# Patient Record
Sex: Female | Born: 1935 | Race: White | Hispanic: No | Smoking: Never smoker
Health system: Southern US, Community
[De-identification: ages and names within clinical notes are randomized; demographics above are authoritative.]

## PROBLEM LIST (undated history)

## (undated) DIAGNOSIS — I219 Acute myocardial infarction, unspecified: Secondary | ICD-10-CM

## (undated) DIAGNOSIS — K754 Autoimmune hepatitis: Secondary | ICD-10-CM

## (undated) DIAGNOSIS — G629 Polyneuropathy, unspecified: Secondary | ICD-10-CM

## (undated) DIAGNOSIS — M1990 Unspecified osteoarthritis, unspecified site: Secondary | ICD-10-CM

## (undated) DIAGNOSIS — H409 Unspecified glaucoma: Secondary | ICD-10-CM

## (undated) DIAGNOSIS — E7800 Pure hypercholesterolemia, unspecified: Secondary | ICD-10-CM

## (undated) DIAGNOSIS — I5030 Unspecified diastolic (congestive) heart failure: Secondary | ICD-10-CM

## (undated) DIAGNOSIS — I2510 Atherosclerotic heart disease of native coronary artery without angina pectoris: Secondary | ICD-10-CM

## (undated) DIAGNOSIS — N6019 Diffuse cystic mastopathy of unspecified breast: Secondary | ICD-10-CM

## (undated) DIAGNOSIS — I4821 Permanent atrial fibrillation: Secondary | ICD-10-CM

## (undated) DIAGNOSIS — I340 Nonrheumatic mitral (valve) insufficiency: Secondary | ICD-10-CM

## (undated) DIAGNOSIS — K279 Peptic ulcer, site unspecified, unspecified as acute or chronic, without hemorrhage or perforation: Secondary | ICD-10-CM

## (undated) DIAGNOSIS — I10 Essential (primary) hypertension: Secondary | ICD-10-CM

## (undated) DIAGNOSIS — E039 Hypothyroidism, unspecified: Secondary | ICD-10-CM

## (undated) DIAGNOSIS — I4891 Unspecified atrial fibrillation: Secondary | ICD-10-CM

## (undated) DIAGNOSIS — I509 Heart failure, unspecified: Secondary | ICD-10-CM

## (undated) HISTORY — PX: APPENDECTOMY: SHX54

## (undated) HISTORY — DX: Pure hypercholesterolemia, unspecified: E78.00

## (undated) HISTORY — PX: OTHER SURGICAL HISTORY: SHX169

## (undated) HISTORY — DX: Acute myocardial infarction, unspecified: I21.9

## (undated) HISTORY — DX: Peptic ulcer, site unspecified, unspecified as acute or chronic, without hemorrhage or perforation: K27.9

## (undated) HISTORY — DX: Nonrheumatic mitral (valve) insufficiency: I34.0

## (undated) HISTORY — DX: Heart failure, unspecified: I50.9

## (undated) HISTORY — DX: Diffuse cystic mastopathy of unspecified breast: N60.19

## (undated) HISTORY — DX: Atherosclerotic heart disease of native coronary artery without angina pectoris: I25.10

## (undated) HISTORY — DX: Polyneuropathy, unspecified: G62.9

## (undated) HISTORY — DX: Essential (primary) hypertension: I10

## (undated) HISTORY — DX: Autoimmune hepatitis: K75.4

## (undated) HISTORY — DX: Unspecified osteoarthritis, unspecified site: M19.90

---

## 1961-03-11 HISTORY — PX: ABDOMINAL HYSTERECTOMY: SHX81

## 1996-03-11 HISTORY — PX: CHOLECYSTECTOMY: SHX55

## 2002-03-11 HISTORY — PX: UPPER GI ENDOSCOPY: SHX6162

## 2004-02-01 ENCOUNTER — Ambulatory Visit: Payer: Self-pay | Admitting: Internal Medicine

## 2004-11-26 ENCOUNTER — Ambulatory Visit: Payer: Self-pay | Admitting: Specialist

## 2005-01-23 ENCOUNTER — Ambulatory Visit: Payer: Self-pay | Admitting: Internal Medicine

## 2005-02-14 ENCOUNTER — Ambulatory Visit: Payer: Self-pay | Admitting: Internal Medicine

## 2005-02-15 ENCOUNTER — Ambulatory Visit: Payer: Self-pay | Admitting: Internal Medicine

## 2005-03-19 ENCOUNTER — Ambulatory Visit: Payer: Self-pay | Admitting: Gastroenterology

## 2005-04-09 ENCOUNTER — Ambulatory Visit: Payer: Self-pay | Admitting: Internal Medicine

## 2005-04-18 ENCOUNTER — Ambulatory Visit: Payer: Self-pay | Admitting: Internal Medicine

## 2005-05-01 ENCOUNTER — Ambulatory Visit: Payer: Self-pay | Admitting: Gastroenterology

## 2005-05-09 ENCOUNTER — Ambulatory Visit: Payer: Self-pay | Admitting: Internal Medicine

## 2005-06-07 ENCOUNTER — Ambulatory Visit: Payer: Self-pay | Admitting: Internal Medicine

## 2005-06-12 ENCOUNTER — Ambulatory Visit: Payer: Self-pay | Admitting: Gastroenterology

## 2005-06-13 ENCOUNTER — Ambulatory Visit: Payer: Self-pay | Admitting: Internal Medicine

## 2005-07-23 ENCOUNTER — Ambulatory Visit: Payer: Self-pay | Admitting: Gastroenterology

## 2005-07-25 ENCOUNTER — Ambulatory Visit: Payer: Self-pay | Admitting: Internal Medicine

## 2005-08-09 ENCOUNTER — Ambulatory Visit: Payer: Self-pay | Admitting: Internal Medicine

## 2005-09-17 ENCOUNTER — Ambulatory Visit: Payer: Self-pay | Admitting: Internal Medicine

## 2005-09-19 ENCOUNTER — Ambulatory Visit: Payer: Self-pay | Admitting: Internal Medicine

## 2005-10-09 ENCOUNTER — Ambulatory Visit: Payer: Self-pay | Admitting: Internal Medicine

## 2005-11-15 ENCOUNTER — Ambulatory Visit: Payer: Self-pay | Admitting: Internal Medicine

## 2005-12-09 ENCOUNTER — Ambulatory Visit: Payer: Self-pay | Admitting: Internal Medicine

## 2006-02-03 ENCOUNTER — Ambulatory Visit: Payer: Self-pay | Admitting: Internal Medicine

## 2006-02-08 ENCOUNTER — Ambulatory Visit: Payer: Self-pay | Admitting: Internal Medicine

## 2006-02-10 ENCOUNTER — Ambulatory Visit: Payer: Self-pay | Admitting: Internal Medicine

## 2006-02-26 ENCOUNTER — Ambulatory Visit: Payer: Self-pay | Admitting: Internal Medicine

## 2006-04-29 ENCOUNTER — Ambulatory Visit: Payer: Self-pay | Admitting: Internal Medicine

## 2006-05-10 ENCOUNTER — Ambulatory Visit: Payer: Self-pay | Admitting: Internal Medicine

## 2006-05-26 ENCOUNTER — Ambulatory Visit: Payer: Self-pay | Admitting: Internal Medicine

## 2006-06-03 ENCOUNTER — Ambulatory Visit: Payer: Self-pay | Admitting: Gastroenterology

## 2006-06-05 ENCOUNTER — Ambulatory Visit: Payer: Self-pay | Admitting: Gastroenterology

## 2006-06-10 ENCOUNTER — Ambulatory Visit: Payer: Self-pay | Admitting: Gastroenterology

## 2006-07-08 ENCOUNTER — Ambulatory Visit: Payer: Self-pay | Admitting: Gastroenterology

## 2006-07-22 ENCOUNTER — Ambulatory Visit: Payer: Self-pay | Admitting: Internal Medicine

## 2006-08-10 ENCOUNTER — Ambulatory Visit: Payer: Self-pay | Admitting: Internal Medicine

## 2006-09-22 ENCOUNTER — Encounter: Admission: RE | Admit: 2006-09-22 | Discharge: 2006-09-22 | Payer: Self-pay | Admitting: Surgery

## 2006-10-14 ENCOUNTER — Ambulatory Visit: Payer: Self-pay | Admitting: Internal Medicine

## 2006-10-14 ENCOUNTER — Ambulatory Visit: Payer: Self-pay | Admitting: Gastroenterology

## 2006-10-23 ENCOUNTER — Ambulatory Visit: Payer: Self-pay | Admitting: Gastroenterology

## 2006-11-10 ENCOUNTER — Ambulatory Visit: Payer: Self-pay | Admitting: Internal Medicine

## 2006-12-01 ENCOUNTER — Ambulatory Visit: Payer: Self-pay | Admitting: Internal Medicine

## 2006-12-10 ENCOUNTER — Ambulatory Visit: Payer: Self-pay | Admitting: Internal Medicine

## 2007-01-02 ENCOUNTER — Ambulatory Visit: Payer: Self-pay | Admitting: Ophthalmology

## 2007-01-05 ENCOUNTER — Ambulatory Visit: Payer: Self-pay | Admitting: Internal Medicine

## 2007-01-10 ENCOUNTER — Ambulatory Visit: Payer: Self-pay | Admitting: Internal Medicine

## 2007-01-22 ENCOUNTER — Ambulatory Visit: Payer: Self-pay | Admitting: Gastroenterology

## 2007-02-03 ENCOUNTER — Ambulatory Visit: Payer: Self-pay | Admitting: Internal Medicine

## 2007-02-26 ENCOUNTER — Ambulatory Visit: Payer: Self-pay | Admitting: Internal Medicine

## 2007-03-10 ENCOUNTER — Ambulatory Visit: Payer: Self-pay | Admitting: Internal Medicine

## 2007-03-12 ENCOUNTER — Ambulatory Visit: Payer: Self-pay | Admitting: Internal Medicine

## 2007-03-12 HISTORY — PX: COLONOSCOPY: SHX174

## 2007-04-12 ENCOUNTER — Ambulatory Visit: Payer: Self-pay | Admitting: Internal Medicine

## 2007-05-29 ENCOUNTER — Ambulatory Visit: Payer: Self-pay | Admitting: Internal Medicine

## 2007-06-08 ENCOUNTER — Ambulatory Visit: Payer: Self-pay | Admitting: Internal Medicine

## 2007-06-10 ENCOUNTER — Ambulatory Visit: Payer: Self-pay | Admitting: Internal Medicine

## 2007-07-10 ENCOUNTER — Ambulatory Visit: Payer: Self-pay | Admitting: Internal Medicine

## 2007-07-24 ENCOUNTER — Ambulatory Visit: Payer: Self-pay | Admitting: Internal Medicine

## 2007-08-04 ENCOUNTER — Ambulatory Visit: Payer: Self-pay | Admitting: Internal Medicine

## 2007-09-09 ENCOUNTER — Ambulatory Visit: Payer: Self-pay | Admitting: Internal Medicine

## 2007-09-17 ENCOUNTER — Ambulatory Visit: Payer: Self-pay | Admitting: Gastroenterology

## 2007-09-18 ENCOUNTER — Ambulatory Visit: Payer: Self-pay | Admitting: Internal Medicine

## 2007-09-28 ENCOUNTER — Ambulatory Visit: Payer: Self-pay | Admitting: Gastroenterology

## 2007-10-10 ENCOUNTER — Ambulatory Visit: Payer: Self-pay | Admitting: Internal Medicine

## 2007-10-20 ENCOUNTER — Ambulatory Visit: Payer: Self-pay | Admitting: General Surgery

## 2007-11-18 ENCOUNTER — Ambulatory Visit: Payer: Self-pay | Admitting: Ophthalmology

## 2007-12-01 ENCOUNTER — Ambulatory Visit: Payer: Self-pay | Admitting: Internal Medicine

## 2007-12-31 ENCOUNTER — Ambulatory Visit: Payer: Self-pay | Admitting: Internal Medicine

## 2008-01-10 ENCOUNTER — Ambulatory Visit: Payer: Self-pay | Admitting: Internal Medicine

## 2008-01-20 ENCOUNTER — Ambulatory Visit: Payer: Self-pay | Admitting: Internal Medicine

## 2008-02-09 ENCOUNTER — Ambulatory Visit: Payer: Self-pay | Admitting: Internal Medicine

## 2008-03-08 ENCOUNTER — Ambulatory Visit: Payer: Self-pay | Admitting: Internal Medicine

## 2008-03-10 ENCOUNTER — Ambulatory Visit: Payer: Self-pay | Admitting: Internal Medicine

## 2008-05-18 ENCOUNTER — Ambulatory Visit: Payer: Self-pay | Admitting: Internal Medicine

## 2008-06-07 ENCOUNTER — Ambulatory Visit: Payer: Self-pay | Admitting: Internal Medicine

## 2008-06-09 ENCOUNTER — Ambulatory Visit: Payer: Self-pay | Admitting: Internal Medicine

## 2008-08-31 ENCOUNTER — Other Ambulatory Visit: Payer: Self-pay | Admitting: Gastroenterology

## 2008-08-31 ENCOUNTER — Other Ambulatory Visit: Payer: Self-pay | Admitting: Internal Medicine

## 2008-09-08 ENCOUNTER — Ambulatory Visit: Payer: Self-pay | Admitting: Internal Medicine

## 2008-09-14 ENCOUNTER — Ambulatory Visit: Payer: Self-pay | Admitting: Internal Medicine

## 2008-09-21 ENCOUNTER — Ambulatory Visit: Payer: Self-pay | Admitting: Gastroenterology

## 2008-10-09 ENCOUNTER — Ambulatory Visit: Payer: Self-pay | Admitting: Internal Medicine

## 2008-12-30 ENCOUNTER — Emergency Department: Payer: Self-pay | Admitting: Emergency Medicine

## 2009-01-06 ENCOUNTER — Other Ambulatory Visit: Payer: Self-pay | Admitting: Internal Medicine

## 2009-03-14 ENCOUNTER — Ambulatory Visit: Payer: Self-pay | Admitting: Internal Medicine

## 2009-06-29 ENCOUNTER — Other Ambulatory Visit: Payer: Self-pay | Admitting: Internal Medicine

## 2009-12-07 ENCOUNTER — Other Ambulatory Visit: Payer: Self-pay | Admitting: Internal Medicine

## 2009-12-11 ENCOUNTER — Ambulatory Visit: Payer: Self-pay | Admitting: Gastroenterology

## 2009-12-22 ENCOUNTER — Other Ambulatory Visit: Payer: Self-pay | Admitting: Internal Medicine

## 2010-03-15 ENCOUNTER — Ambulatory Visit: Payer: Self-pay | Admitting: Internal Medicine

## 2010-03-28 ENCOUNTER — Other Ambulatory Visit: Payer: Self-pay | Admitting: Internal Medicine

## 2010-07-06 ENCOUNTER — Other Ambulatory Visit: Payer: Self-pay | Admitting: Internal Medicine

## 2010-08-16 ENCOUNTER — Encounter (INDEPENDENT_AMBULATORY_CARE_PROVIDER_SITE_OTHER): Payer: Self-pay | Admitting: Surgery

## 2010-09-04 ENCOUNTER — Emergency Department: Payer: Self-pay | Admitting: Emergency Medicine

## 2010-10-17 ENCOUNTER — Emergency Department (HOSPITAL_COMMUNITY): Payer: No Typology Code available for payment source

## 2010-10-17 ENCOUNTER — Emergency Department (HOSPITAL_COMMUNITY)
Admission: EM | Admit: 2010-10-17 | Discharge: 2010-10-17 | Disposition: A | Discharge status: Home / Self Care | Payer: No Typology Code available for payment source | Attending: Emergency Medicine | Admitting: Emergency Medicine

## 2010-10-17 DIAGNOSIS — M25579 Pain in unspecified ankle and joints of unspecified foot: Secondary | ICD-10-CM | POA: Insufficient documentation

## 2010-10-17 DIAGNOSIS — S82899A Other fracture of unspecified lower leg, initial encounter for closed fracture: Secondary | ICD-10-CM | POA: Insufficient documentation

## 2010-10-17 DIAGNOSIS — M79609 Pain in unspecified limb: Secondary | ICD-10-CM | POA: Insufficient documentation

## 2010-10-17 DIAGNOSIS — Z79899 Other long term (current) drug therapy: Secondary | ICD-10-CM | POA: Insufficient documentation

## 2010-10-17 DIAGNOSIS — Z7982 Long term (current) use of aspirin: Secondary | ICD-10-CM | POA: Insufficient documentation

## 2010-10-17 DIAGNOSIS — R9389 Abnormal findings on diagnostic imaging of other specified body structures: Secondary | ICD-10-CM | POA: Insufficient documentation

## 2010-10-17 DIAGNOSIS — M25473 Effusion, unspecified ankle: Secondary | ICD-10-CM | POA: Insufficient documentation

## 2010-10-17 DIAGNOSIS — M542 Cervicalgia: Secondary | ICD-10-CM | POA: Insufficient documentation

## 2010-10-17 DIAGNOSIS — S5010XA Contusion of unspecified forearm, initial encounter: Secondary | ICD-10-CM | POA: Insufficient documentation

## 2010-10-17 DIAGNOSIS — M25476 Effusion, unspecified foot: Secondary | ICD-10-CM | POA: Insufficient documentation

## 2010-10-17 LAB — POCT I-STAT, CHEM 8
BUN: 17 mg/dL (ref 6–23)
Calcium, Ion: 1.07 mmol/L — ABNORMAL LOW (ref 1.12–1.32)
Creatinine, Ser: 1.1 mg/dL (ref 0.50–1.10)
Glucose, Bld: 114 mg/dL — ABNORMAL HIGH (ref 70–99)
TCO2: 21 mmol/L (ref 0–100)

## 2010-10-17 LAB — URINALYSIS, ROUTINE W REFLEX MICROSCOPIC
Bilirubin Urine: NEGATIVE
Glucose, UA: NEGATIVE mg/dL
Ketones, ur: NEGATIVE mg/dL
Protein, ur: NEGATIVE mg/dL

## 2010-10-17 LAB — URINE MICROSCOPIC-ADD ON

## 2010-10-17 MED ORDER — IOHEXOL 300 MG/ML  SOLN
80.0000 mL | Freq: Once | INTRAMUSCULAR | Status: AC | PRN
  Administered 2010-10-17: 80 mL via INTRAVENOUS

## 2010-11-28 ENCOUNTER — Other Ambulatory Visit: Payer: Self-pay | Admitting: Internal Medicine

## 2010-12-05 ENCOUNTER — Other Ambulatory Visit: Payer: Self-pay | Admitting: Internal Medicine

## 2011-01-07 ENCOUNTER — Ambulatory Visit: Payer: Self-pay | Admitting: Gastroenterology

## 2011-02-15 ENCOUNTER — Other Ambulatory Visit: Payer: Self-pay | Admitting: Internal Medicine

## 2011-02-21 ENCOUNTER — Other Ambulatory Visit: Payer: Self-pay | Admitting: Internal Medicine

## 2011-03-14 DIAGNOSIS — M999 Biomechanical lesion, unspecified: Secondary | ICD-10-CM | POA: Diagnosis not present

## 2011-03-14 DIAGNOSIS — M5380 Other specified dorsopathies, site unspecified: Secondary | ICD-10-CM | POA: Diagnosis not present

## 2011-03-14 DIAGNOSIS — M5430 Sciatica, unspecified side: Secondary | ICD-10-CM | POA: Diagnosis not present

## 2011-03-18 ENCOUNTER — Ambulatory Visit: Payer: Self-pay | Admitting: Internal Medicine

## 2011-03-18 DIAGNOSIS — Z1231 Encounter for screening mammogram for malignant neoplasm of breast: Secondary | ICD-10-CM | POA: Diagnosis not present

## 2011-03-18 DIAGNOSIS — R920 Mammographic microcalcification found on diagnostic imaging of breast: Secondary | ICD-10-CM | POA: Diagnosis not present

## 2011-03-25 DIAGNOSIS — L819 Disorder of pigmentation, unspecified: Secondary | ICD-10-CM | POA: Diagnosis not present

## 2011-03-25 DIAGNOSIS — L723 Sebaceous cyst: Secondary | ICD-10-CM | POA: Diagnosis not present

## 2011-03-25 DIAGNOSIS — L570 Actinic keratosis: Secondary | ICD-10-CM | POA: Diagnosis not present

## 2011-03-26 DIAGNOSIS — R002 Palpitations: Secondary | ICD-10-CM | POA: Diagnosis not present

## 2011-03-26 DIAGNOSIS — I10 Essential (primary) hypertension: Secondary | ICD-10-CM | POA: Diagnosis not present

## 2011-03-26 DIAGNOSIS — E7800 Pure hypercholesterolemia, unspecified: Secondary | ICD-10-CM | POA: Diagnosis not present

## 2011-03-26 DIAGNOSIS — K219 Gastro-esophageal reflux disease without esophagitis: Secondary | ICD-10-CM | POA: Diagnosis not present

## 2011-03-26 DIAGNOSIS — R42 Dizziness and giddiness: Secondary | ICD-10-CM | POA: Diagnosis not present

## 2011-03-28 DIAGNOSIS — M5430 Sciatica, unspecified side: Secondary | ICD-10-CM | POA: Diagnosis not present

## 2011-03-28 DIAGNOSIS — M999 Biomechanical lesion, unspecified: Secondary | ICD-10-CM | POA: Diagnosis not present

## 2011-03-28 DIAGNOSIS — M5380 Other specified dorsopathies, site unspecified: Secondary | ICD-10-CM | POA: Diagnosis not present

## 2011-04-03 DIAGNOSIS — J069 Acute upper respiratory infection, unspecified: Secondary | ICD-10-CM | POA: Diagnosis not present

## 2011-04-09 DIAGNOSIS — B351 Tinea unguium: Secondary | ICD-10-CM | POA: Diagnosis not present

## 2011-04-09 DIAGNOSIS — Q6689 Other  specified congenital deformities of feet: Secondary | ICD-10-CM | POA: Diagnosis not present

## 2011-04-09 DIAGNOSIS — M79609 Pain in unspecified limb: Secondary | ICD-10-CM | POA: Diagnosis not present

## 2011-04-09 DIAGNOSIS — M779 Enthesopathy, unspecified: Secondary | ICD-10-CM | POA: Diagnosis not present

## 2011-04-10 DIAGNOSIS — R011 Cardiac murmur, unspecified: Secondary | ICD-10-CM | POA: Diagnosis not present

## 2011-04-11 DIAGNOSIS — M5380 Other specified dorsopathies, site unspecified: Secondary | ICD-10-CM | POA: Diagnosis not present

## 2011-04-11 DIAGNOSIS — M5430 Sciatica, unspecified side: Secondary | ICD-10-CM | POA: Diagnosis not present

## 2011-04-11 DIAGNOSIS — M999 Biomechanical lesion, unspecified: Secondary | ICD-10-CM | POA: Diagnosis not present

## 2011-04-18 ENCOUNTER — Other Ambulatory Visit (INDEPENDENT_AMBULATORY_CARE_PROVIDER_SITE_OTHER): Payer: Self-pay | Admitting: Surgery

## 2011-04-23 DIAGNOSIS — I10 Essential (primary) hypertension: Secondary | ICD-10-CM | POA: Diagnosis not present

## 2011-04-23 DIAGNOSIS — J019 Acute sinusitis, unspecified: Secondary | ICD-10-CM | POA: Diagnosis not present

## 2011-04-25 ENCOUNTER — Encounter (INDEPENDENT_AMBULATORY_CARE_PROVIDER_SITE_OTHER): Payer: Self-pay | Admitting: Surgery

## 2011-04-30 ENCOUNTER — Ambulatory Visit (INDEPENDENT_AMBULATORY_CARE_PROVIDER_SITE_OTHER): Payer: Medicare Other | Admitting: Surgery

## 2011-04-30 ENCOUNTER — Encounter (INDEPENDENT_AMBULATORY_CARE_PROVIDER_SITE_OTHER): Payer: Self-pay | Admitting: Surgery

## 2011-04-30 DIAGNOSIS — E039 Hypothyroidism, unspecified: Secondary | ICD-10-CM | POA: Diagnosis not present

## 2011-04-30 DIAGNOSIS — E063 Autoimmune thyroiditis: Secondary | ICD-10-CM | POA: Insufficient documentation

## 2011-04-30 MED ORDER — LEVOTHYROXINE SODIUM 112 MCG PO TABS: 112.0000 ug | ORAL_TABLET | Freq: Every day | ORAL | Status: DC

## 2011-04-30 NOTE — Progress Notes (Signed)
Visit Diagnoses: 1. Hypothyroidism   2. Thyroiditis, lymphocytic     HISTORY: Patient is an 78 year old white female followed in my practice for autoimmune thyroiditis and hypothyroidism. She was last evaluated here in March of 2012. At that time she underwent thyroid ultrasound. She is currently taking Synthroid 112 mcg daily. Patient returns today for scheduled followup.  PERTINENT REVIEW OF SYSTEMS: The patient denies any new masses or nodules. She denies pain. She denies palpitations. She denies tremors. She denies dysphagia. She denies shortness of breath.  EXAM: HEENT: normocephalic; pupils equal and reactive; sclerae clear; dentition good; mucous membranes moist NECK:  Thyroid gland is firm, right lobe slightly larger than the left; symmetric on extension; no palpable anterior or posterior cervical lymphadenopathy; no supraclavicular masses; no tenderness CHEST: clear to auscultation bilaterally without rales, rhonchi, or wheezes CARDIAC: regular rate and rhythm without significant murmur; peripheral pulses are full EXT:  non-tender without edema; no deformity NEURO: no gross focal deficits; no sign of tremor   IMPRESSION: #1 probable autoimmune thyroiditis #2 hypothyroidism  PLAN: Patient requires no further studies at this point in time. I have renewed her prescription for Synthroid 112 mcg daily for one year. She will return for followup in one year.  Prior to that visit, we will ask her primary physician to obtain a TSH level.  Earnstine Regal, MD, [facility]  Primary MD:  Dr. Einar Pheasant

## 2011-05-21 DIAGNOSIS — M79609 Pain in unspecified limb: Secondary | ICD-10-CM | POA: Diagnosis not present

## 2011-05-21 DIAGNOSIS — E7800 Pure hypercholesterolemia, unspecified: Secondary | ICD-10-CM | POA: Diagnosis not present

## 2011-05-21 DIAGNOSIS — I10 Essential (primary) hypertension: Secondary | ICD-10-CM | POA: Diagnosis not present

## 2011-05-21 DIAGNOSIS — M542 Cervicalgia: Secondary | ICD-10-CM | POA: Diagnosis not present

## 2011-05-23 DIAGNOSIS — M62838 Other muscle spasm: Secondary | ICD-10-CM | POA: Diagnosis not present

## 2011-05-23 DIAGNOSIS — M5412 Radiculopathy, cervical region: Secondary | ICD-10-CM | POA: Diagnosis not present

## 2011-05-23 DIAGNOSIS — M9981 Other biomechanical lesions of cervical region: Secondary | ICD-10-CM | POA: Diagnosis not present

## 2011-05-23 DIAGNOSIS — M542 Cervicalgia: Secondary | ICD-10-CM | POA: Diagnosis not present

## 2011-05-30 DIAGNOSIS — M9981 Other biomechanical lesions of cervical region: Secondary | ICD-10-CM | POA: Diagnosis not present

## 2011-05-30 DIAGNOSIS — M542 Cervicalgia: Secondary | ICD-10-CM | POA: Diagnosis not present

## 2011-05-30 DIAGNOSIS — M62838 Other muscle spasm: Secondary | ICD-10-CM | POA: Diagnosis not present

## 2011-05-30 DIAGNOSIS — M5412 Radiculopathy, cervical region: Secondary | ICD-10-CM | POA: Diagnosis not present

## 2011-06-04 ENCOUNTER — Encounter (INDEPENDENT_AMBULATORY_CARE_PROVIDER_SITE_OTHER): Payer: Self-pay

## 2011-06-12 DIAGNOSIS — K745 Biliary cirrhosis, unspecified: Secondary | ICD-10-CM | POA: Diagnosis not present

## 2011-06-19 DIAGNOSIS — Q6689 Other  specified congenital deformities of feet: Secondary | ICD-10-CM | POA: Diagnosis not present

## 2011-06-19 DIAGNOSIS — M779 Enthesopathy, unspecified: Secondary | ICD-10-CM | POA: Diagnosis not present

## 2011-06-21 DIAGNOSIS — E7800 Pure hypercholesterolemia, unspecified: Secondary | ICD-10-CM | POA: Diagnosis not present

## 2011-06-21 DIAGNOSIS — Z79899 Other long term (current) drug therapy: Secondary | ICD-10-CM | POA: Diagnosis not present

## 2011-06-21 DIAGNOSIS — I10 Essential (primary) hypertension: Secondary | ICD-10-CM | POA: Diagnosis not present

## 2011-07-01 DIAGNOSIS — E7800 Pure hypercholesterolemia, unspecified: Secondary | ICD-10-CM | POA: Diagnosis not present

## 2011-07-01 DIAGNOSIS — I10 Essential (primary) hypertension: Secondary | ICD-10-CM | POA: Diagnosis not present

## 2011-07-01 DIAGNOSIS — R945 Abnormal results of liver function studies: Secondary | ICD-10-CM | POA: Diagnosis not present

## 2011-07-25 DIAGNOSIS — E039 Hypothyroidism, unspecified: Secondary | ICD-10-CM | POA: Diagnosis not present

## 2011-07-25 DIAGNOSIS — I10 Essential (primary) hypertension: Secondary | ICD-10-CM | POA: Diagnosis not present

## 2011-08-15 DIAGNOSIS — H26499 Other secondary cataract, unspecified eye: Secondary | ICD-10-CM | POA: Diagnosis not present

## 2011-08-23 DIAGNOSIS — J029 Acute pharyngitis, unspecified: Secondary | ICD-10-CM | POA: Diagnosis not present

## 2011-09-05 DIAGNOSIS — J209 Acute bronchitis, unspecified: Secondary | ICD-10-CM | POA: Diagnosis not present

## 2011-09-05 DIAGNOSIS — J309 Allergic rhinitis, unspecified: Secondary | ICD-10-CM | POA: Diagnosis not present

## 2011-09-11 DIAGNOSIS — Q6689 Other  specified congenital deformities of feet: Secondary | ICD-10-CM | POA: Diagnosis not present

## 2011-09-11 DIAGNOSIS — M779 Enthesopathy, unspecified: Secondary | ICD-10-CM | POA: Diagnosis not present

## 2011-09-11 DIAGNOSIS — M79609 Pain in unspecified limb: Secondary | ICD-10-CM | POA: Diagnosis not present

## 2011-09-11 DIAGNOSIS — B351 Tinea unguium: Secondary | ICD-10-CM | POA: Diagnosis not present

## 2011-09-19 DIAGNOSIS — E7800 Pure hypercholesterolemia, unspecified: Secondary | ICD-10-CM | POA: Diagnosis not present

## 2011-09-19 DIAGNOSIS — J019 Acute sinusitis, unspecified: Secondary | ICD-10-CM | POA: Diagnosis not present

## 2011-09-19 DIAGNOSIS — I10 Essential (primary) hypertension: Secondary | ICD-10-CM | POA: Diagnosis not present

## 2011-10-23 DIAGNOSIS — M810 Age-related osteoporosis without current pathological fracture: Secondary | ICD-10-CM | POA: Diagnosis not present

## 2011-10-30 DIAGNOSIS — M810 Age-related osteoporosis without current pathological fracture: Secondary | ICD-10-CM | POA: Diagnosis not present

## 2011-12-04 DIAGNOSIS — M79609 Pain in unspecified limb: Secondary | ICD-10-CM | POA: Diagnosis not present

## 2011-12-04 DIAGNOSIS — B351 Tinea unguium: Secondary | ICD-10-CM | POA: Diagnosis not present

## 2011-12-10 ENCOUNTER — Ambulatory Visit: Payer: Self-pay | Admitting: Gastroenterology

## 2011-12-10 DIAGNOSIS — K838 Other specified diseases of biliary tract: Secondary | ICD-10-CM | POA: Diagnosis not present

## 2011-12-10 DIAGNOSIS — R935 Abnormal findings on diagnostic imaging of other abdominal regions, including retroperitoneum: Secondary | ICD-10-CM | POA: Diagnosis not present

## 2011-12-10 DIAGNOSIS — R7401 Elevation of levels of liver transaminase levels: Secondary | ICD-10-CM | POA: Diagnosis not present

## 2011-12-10 DIAGNOSIS — R7989 Other specified abnormal findings of blood chemistry: Secondary | ICD-10-CM | POA: Diagnosis not present

## 2011-12-10 DIAGNOSIS — Z23 Encounter for immunization: Secondary | ICD-10-CM | POA: Diagnosis not present

## 2011-12-11 DIAGNOSIS — K745 Biliary cirrhosis, unspecified: Secondary | ICD-10-CM | POA: Diagnosis not present

## 2011-12-18 DIAGNOSIS — J029 Acute pharyngitis, unspecified: Secondary | ICD-10-CM | POA: Diagnosis not present

## 2011-12-18 DIAGNOSIS — J019 Acute sinusitis, unspecified: Secondary | ICD-10-CM | POA: Diagnosis not present

## 2012-01-07 ENCOUNTER — Ambulatory Visit: Payer: Self-pay | Admitting: Gastroenterology

## 2012-01-07 DIAGNOSIS — K769 Liver disease, unspecified: Secondary | ICD-10-CM | POA: Diagnosis not present

## 2012-01-07 DIAGNOSIS — R141 Gas pain: Secondary | ICD-10-CM | POA: Diagnosis not present

## 2012-01-07 DIAGNOSIS — R933 Abnormal findings on diagnostic imaging of other parts of digestive tract: Secondary | ICD-10-CM | POA: Diagnosis not present

## 2012-01-07 DIAGNOSIS — R142 Eructation: Secondary | ICD-10-CM | POA: Diagnosis not present

## 2012-01-07 DIAGNOSIS — Z9889 Other specified postprocedural states: Secondary | ICD-10-CM | POA: Diagnosis not present

## 2012-01-15 ENCOUNTER — Encounter: Payer: Self-pay | Admitting: *Deleted

## 2012-01-16 ENCOUNTER — Ambulatory Visit (INDEPENDENT_AMBULATORY_CARE_PROVIDER_SITE_OTHER): Payer: Medicare Other | Admitting: Internal Medicine

## 2012-01-16 ENCOUNTER — Encounter: Payer: Self-pay | Admitting: Internal Medicine

## 2012-01-16 VITALS — BP 144/78 | HR 62 | Temp 98.5°F | Ht 61.0 in | Wt 148.0 lb

## 2012-01-16 DIAGNOSIS — K754 Autoimmune hepatitis: Secondary | ICD-10-CM

## 2012-01-16 DIAGNOSIS — R5381 Other malaise: Secondary | ICD-10-CM

## 2012-01-16 DIAGNOSIS — Z139 Encounter for screening, unspecified: Secondary | ICD-10-CM

## 2012-01-16 DIAGNOSIS — R040 Epistaxis: Secondary | ICD-10-CM

## 2012-01-16 DIAGNOSIS — R5383 Other fatigue: Secondary | ICD-10-CM

## 2012-01-16 DIAGNOSIS — E785 Hyperlipidemia, unspecified: Secondary | ICD-10-CM | POA: Insufficient documentation

## 2012-01-16 DIAGNOSIS — I10 Essential (primary) hypertension: Secondary | ICD-10-CM | POA: Diagnosis not present

## 2012-01-16 DIAGNOSIS — E039 Hypothyroidism, unspecified: Secondary | ICD-10-CM

## 2012-01-16 DIAGNOSIS — E7800 Pure hypercholesterolemia, unspecified: Secondary | ICD-10-CM | POA: Insufficient documentation

## 2012-01-16 DIAGNOSIS — M8580 Other specified disorders of bone density and structure, unspecified site: Secondary | ICD-10-CM

## 2012-01-16 DIAGNOSIS — M899 Disorder of bone, unspecified: Secondary | ICD-10-CM

## 2012-01-16 DIAGNOSIS — M25519 Pain in unspecified shoulder: Secondary | ICD-10-CM

## 2012-01-16 DIAGNOSIS — E063 Autoimmune thyroiditis: Secondary | ICD-10-CM

## 2012-01-16 MED ORDER — AMLODIPINE BESYLATE 10 MG PO TABS: 10.0000 mg | ORAL_TABLET | Freq: Every day | ORAL | Status: DC

## 2012-01-16 MED ORDER — TRAMADOL HCL 50 MG PO TABS: 50.0000 mg | ORAL_TABLET | Freq: Two times a day (BID) | ORAL | Status: DC | PRN

## 2012-01-16 NOTE — Patient Instructions (Signed)
It was nice seeing you today.  I am glad you have been doing well.  We will schedule an appt to see Dr Jefm Bryant (for your shoulder) and Dr Pryor Ochoa - for the continued bleeding.

## 2012-01-19 NOTE — Progress Notes (Signed)
  Subjective:    [name], female    DOB: 19-Aug-1928, 78 y.o.   MRN: [id number]  HPI 78 year old female with past history of hypertension, hypercholesterolemia, FCD, hypothyroidism and autoimmune hepatitis who comes in today for a scheduled follow up.  She states she has been doing relatively well.  She is having trouble with right shoulder pain.  Notices mostly with extension.  Request referral to Ruth Roth for possible injection.  She also has been having continued nose bleeds. States she has been having 2-3 nosebleeds per week.   Saw Ruth Roth at the beginning of October.  Had and ultrasound.  Duct dilated.  Subsequently had an MRCP.  Due to follow up with Ruth Roth next week.  No abdominal pain or cramping.  No nausea or vomiting.  Bowels doing well.   Past Medical History  Diagnosis Date  . Hypertension   . Hypercholesterolemia   . 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 Fibrocystic breast disease   . Osteoarthritis   . Inflammatory arthritis   . Autoimmune hepatitis     followed by Ruth Roth    Review of Systems Patient denies any headache, lightheadedness or dizziness. No significant sinus or allergy symptoms.  No chest pain, tightness or palpitations.  No increased shortness of breath, cough or congestion. No acid reflux or problems swallowing.  No nausea or vomiting.  No abdominal pain or cramping.  No bowel change, such as diarrhea, constipation, BRBPR or melana.  No urine change.        Objective:   Physical Exam Filed Vitals:   01/16/12 1322  BP: 144/78  Pulse: 62  Temp: 98.5 F (65.80 C)   78 year old female in no acute distress.   HEENT:  Nares - clear except for minimal erythema right nostril.  OP- without lesions or erythema.  NECK:  Supple, nontender.  No audible bruit.   HEART:  Appears to be regular. LUNGS:  Without crackles or wheezing audible.  Respirations even and unlabored.     RADIAL PULSE:  Equal bilaterally.  ABDOMEN:  Soft, nontender.  No audible abdominal bruit.   EXTREMITIES:  No increased edema to be present.                     Assessment & Plan:  MSK.  Shoulder pain as outlined.  Will refer to Ruth Roth for evaluation and question of need/benefit of injection.    EPISTAXIS.  Persistent intermittent nose bleeds.  Will refer to ENT for evaluation and treatment.   CARDIOVASCULAR.  Currently asymptomatic.  Last ECHO revealed normal heart function with moderate to severe mitral regurgitation and moderate tricuspid regurgitation.  Follow.    HEALTH MAINTENANCE.  Schedule a physical when due.  Obtain outside records for review.  Last physical 11/21/10.  She is s/p hysterectomy.  Mammogram 03/18/11 - BiRADS II.

## 2012-01-20 ENCOUNTER — Encounter: Payer: Self-pay | Admitting: Internal Medicine

## 2012-01-20 DIAGNOSIS — M810 Age-related osteoporosis without current pathological fracture: Secondary | ICD-10-CM | POA: Insufficient documentation

## 2012-01-20 DIAGNOSIS — K754 Autoimmune hepatitis: Secondary | ICD-10-CM | POA: Insufficient documentation

## 2012-01-20 NOTE — Assessment & Plan Note (Signed)
Seeing Dr Harlow Asa.  On Synthroid.  Stable.

## 2012-01-20 NOTE — Assessment & Plan Note (Signed)
Received Reclast.

## 2012-01-20 NOTE — Assessment & Plan Note (Signed)
Low cholesterol diet and exercise.  Check lipid panel with next labs.

## 2012-01-20 NOTE — Assessment & Plan Note (Addendum)
/  fikkiwed by Dr [name].  Recent ultrasound she reports revealed a dilated duct.  Had ERCP.  Waiting for results.

## 2012-01-20 NOTE — Assessment & Plan Note (Signed)
Blood pressure under reasonable control.  Same meds.  Check metabolic panel with next labs.

## 2012-01-29 ENCOUNTER — Other Ambulatory Visit (INDEPENDENT_AMBULATORY_CARE_PROVIDER_SITE_OTHER): Payer: Medicare Other

## 2012-01-29 DIAGNOSIS — E7800 Pure hypercholesterolemia, unspecified: Secondary | ICD-10-CM

## 2012-01-29 DIAGNOSIS — R5383 Other fatigue: Secondary | ICD-10-CM

## 2012-01-29 DIAGNOSIS — E039 Hypothyroidism, unspecified: Secondary | ICD-10-CM | POA: Diagnosis not present

## 2012-01-29 DIAGNOSIS — I10 Essential (primary) hypertension: Secondary | ICD-10-CM

## 2012-01-29 LAB — LIPID PANEL
Cholesterol: 208 mg/dL — ABNORMAL HIGH (ref 0–200)
HDL: 59 mg/dL (ref >39.00)
Triglycerides: 173 mg/dL — ABNORMAL HIGH (ref 0.0–149.0)
VLDL: 34.6 mg/dL (ref 0.0–40.0)

## 2012-01-29 LAB — BASIC METABOLIC PANEL
BUN: 20 mg/dL (ref 6–23)
Chloride: 109 mEq/L (ref 96–112)
Creatinine, Ser: 0.9 mg/dL (ref 0.4–1.2)

## 2012-01-29 LAB — CBC WITH DIFFERENTIAL/PLATELET
Basophils Absolute: 0 10*3/uL (ref 0.0–0.1)
HCT: 36.7 % (ref 36.0–46.0)
Lymphs Abs: 2.6 10*3/uL (ref 0.7–4.0)
MCV: 98.6 fl (ref 78.0–100.0)
Monocytes Absolute: 0.4 10*3/uL (ref 0.1–1.0)
Monocytes Relative: 6 % (ref 3.0–12.0)
Platelets: 234 10*3/uL (ref 150.0–400.0)
RDW: 14.1 % (ref 11.5–14.6)

## 2012-01-29 LAB — LDL CHOLESTEROL, DIRECT: Direct LDL: 125.8 mg/dL

## 2012-01-29 LAB — TSH: TSH: 1.54 u[IU]/mL (ref 0.35–5.50)

## 2012-01-29 NOTE — Progress Notes (Signed)
Called patient to let her know

## 2012-02-13 DIAGNOSIS — H119 Unspecified disorder of conjunctiva: Secondary | ICD-10-CM | POA: Diagnosis not present

## 2012-02-17 DIAGNOSIS — M25519 Pain in unspecified shoulder: Secondary | ICD-10-CM | POA: Diagnosis not present

## 2012-02-17 DIAGNOSIS — M159 Polyosteoarthritis, unspecified: Secondary | ICD-10-CM | POA: Diagnosis not present

## 2012-02-25 DIAGNOSIS — J019 Acute sinusitis, unspecified: Secondary | ICD-10-CM | POA: Diagnosis not present

## 2012-02-25 DIAGNOSIS — H6690 Otitis media, unspecified, unspecified ear: Secondary | ICD-10-CM | POA: Diagnosis not present

## 2012-03-02 DIAGNOSIS — H6690 Otitis media, unspecified, unspecified ear: Secondary | ICD-10-CM | POA: Diagnosis not present

## 2012-03-02 DIAGNOSIS — J019 Acute sinusitis, unspecified: Secondary | ICD-10-CM | POA: Diagnosis not present

## 2012-03-09 DIAGNOSIS — Q6689 Other  specified congenital deformities of feet: Secondary | ICD-10-CM | POA: Diagnosis not present

## 2012-03-09 DIAGNOSIS — M779 Enthesopathy, unspecified: Secondary | ICD-10-CM | POA: Diagnosis not present

## 2012-03-24 ENCOUNTER — Other Ambulatory Visit: Payer: Self-pay | Admitting: Internal Medicine

## 2012-03-24 ENCOUNTER — Ambulatory Visit: Payer: Self-pay | Admitting: Internal Medicine

## 2012-03-24 DIAGNOSIS — R928 Other abnormal and inconclusive findings on diagnostic imaging of breast: Secondary | ICD-10-CM | POA: Diagnosis not present

## 2012-03-24 DIAGNOSIS — Z1231 Encounter for screening mammogram for malignant neoplasm of breast: Secondary | ICD-10-CM | POA: Diagnosis not present

## 2012-03-24 NOTE — Telephone Encounter (Signed)
Call into [place] [address] [facility] omeprazole (PRILOSEC) 20 MG capsule   METOPROLOL SUCCINATE PO

## 2012-03-25 MED ORDER — OMEPRAZOLE 20 MG PO CPDR: 20.0000 mg | DELAYED_RELEASE_CAPSULE | Freq: Two times a day (BID) | ORAL | Status: DC

## 2012-03-25 NOTE — Telephone Encounter (Signed)
Sent in to pharmacy.  

## 2012-04-10 ENCOUNTER — Encounter: Payer: Self-pay | Admitting: Internal Medicine

## 2012-04-13 DIAGNOSIS — M7520 Bicipital tendinitis, unspecified shoulder: Secondary | ICD-10-CM | POA: Diagnosis not present

## 2012-04-13 DIAGNOSIS — M75100 Unspecified rotator cuff tear or rupture of unspecified shoulder, not specified as traumatic: Secondary | ICD-10-CM | POA: Diagnosis not present

## 2012-04-27 DIAGNOSIS — Q6689 Other  specified congenital deformities of feet: Secondary | ICD-10-CM | POA: Diagnosis not present

## 2012-04-27 DIAGNOSIS — M779 Enthesopathy, unspecified: Secondary | ICD-10-CM | POA: Diagnosis not present

## 2012-05-01 ENCOUNTER — Ambulatory Visit (INDEPENDENT_AMBULATORY_CARE_PROVIDER_SITE_OTHER): Payer: Medicare Other | Admitting: Internal Medicine

## 2012-05-01 ENCOUNTER — Encounter: Payer: Self-pay | Admitting: Internal Medicine

## 2012-05-01 VITALS — BP 126/70 | HR 66 | Temp 98.2°F | Ht 61.25 in | Wt 152.0 lb

## 2012-05-01 DIAGNOSIS — K754 Autoimmune hepatitis: Secondary | ICD-10-CM | POA: Diagnosis not present

## 2012-05-01 DIAGNOSIS — E039 Hypothyroidism, unspecified: Secondary | ICD-10-CM

## 2012-05-01 DIAGNOSIS — E7800 Pure hypercholesterolemia, unspecified: Secondary | ICD-10-CM | POA: Diagnosis not present

## 2012-05-01 DIAGNOSIS — M8580 Other specified disorders of bone density and structure, unspecified site: Secondary | ICD-10-CM

## 2012-05-01 DIAGNOSIS — M949 Disorder of cartilage, unspecified: Secondary | ICD-10-CM

## 2012-05-01 DIAGNOSIS — I10 Essential (primary) hypertension: Secondary | ICD-10-CM

## 2012-05-04 ENCOUNTER — Ambulatory Visit (INDEPENDENT_AMBULATORY_CARE_PROVIDER_SITE_OTHER): Payer: Medicare Other | Admitting: Surgery

## 2012-05-04 ENCOUNTER — Encounter (INDEPENDENT_AMBULATORY_CARE_PROVIDER_SITE_OTHER): Payer: Self-pay | Admitting: Surgery

## 2012-05-04 VITALS — BP 138/66 | HR 76 | Temp 97.2°F | Resp 16 | Ht 63.0 in | Wt 154.0 lb

## 2012-05-04 DIAGNOSIS — E063 Autoimmune thyroiditis: Secondary | ICD-10-CM

## 2012-05-04 DIAGNOSIS — E039 Hypothyroidism, unspecified: Secondary | ICD-10-CM | POA: Diagnosis not present

## 2012-05-04 MED ORDER — LEVOTHYROXINE SODIUM 112 MCG PO TABS: 112.0000 ug | ORAL_TABLET | Freq: Every day | ORAL | Status: DC

## 2012-05-04 NOTE — Patient Instructions (Signed)
Thyroid-Stimulating Hormone The amount of thyroid-stimulating hormone (TSH) or thyrotropin can be measured from a sample of blood. The TSH level can help diagnose thyroid gland or pituitary gland disorders, or monitor treatment of hypothyroidism and hyperthyroidism. TSH is produced by the pituitary gland, a tiny organ located below the brain. The pituitary gland is part of the body's feedback system to maintain stable levels of thyroid hormones released into the blood. Thyroid hormones help control the rate at which the body uses energy. The pituitary gland monitors the level of thyroid hormones released by the thyroid gland. The thyroid gland is a small butterfly-shaped gland that lies flat against the windpipe. If the thyroid gland does not release enough thyroid hormones, the pituitary gland detects the reduced thyroid hormone levels. The pituitary gland then makes more TSH to trigger the thyroid gland to produce more thyroid hormones. This increase in TSH is an effort to return the low thyroid hormones to normal levels. The increased TSH level is caused by the low thyroid hormone levels of an underactive thyroid (hypothyroidism). Symptoms of hypothyroidism include menstrual irregularities in women, weight gain, dry skin, constipation, cold intolerance, and fatigue. Rarely, a high TSH level can indicate a problem with the pituitary gland. A high TSH level could also occur when there is an insufficient level of thyroid hormone medication in individuals receiving that medication. If the thyroid gland releases too much thyroid hormones, the pituitary gland detects the increased thyroid hormone levels. The pituitary gland then makes less TSH to slow the thyroid gland from producing thyroid hormones. This decrease in TSH is an effort to return the increased thyroid hormones to normal levels. The decreased TSH level is caused by the excess thyroid hormone levels of an overactive thyroid gland (hyperthyroidism).  Symptoms associated with hyperthyroidism include rapid heart rate, weight loss, nervousness, hand tremors, irritated eyes, and difficulty sleeping. Rarely, a low TSH level can indicate a problem with the pituitary gland. PREPARATION FOR TEST No specific preparation is required for this blood test. A blood sample is obtained from a needle placed in a vein in your arm or from pricking the heel of an infant.  NORMAL FINDINGS  Adult: 0.5-5 milli-international Units/L (0.5-5 mIU/L)  Newborn: 3-20 milli-international Units/L (3-20 mIU/L)  Cord: 3-12 milli-international Units/L (3-12 mIU/L) Ranges for normal findings may vary among different laboratories and hospitals. You should always check with your doctor after having lab work or other tests done to discuss the meaning of your test results and whether your values are considered within normal limits. MEANING OF TEST  Your caregiver will go over the test results with you and discuss the importance and meaning of your results, as well as treatment options and the need for additional tests if necessary. OBTAINING THE TEST RESULTS It is your responsibility to obtain your test results. Ask the lab or department performing the test when and how you will get your results. Document Released: 03/22/2004 Document Revised: 05/20/2011 Document Reviewed: 02/07/2008 [facility] Patient Information 2013 [place].

## 2012-05-04 NOTE — Progress Notes (Signed)
General Surgery [facility]  Visit Diagnoses: 1. Thyroiditis, lymphocytic   2. Hypothyroidism     HISTORY: Patient is an 79 year old white female followed for chronic lymphocytic thyroiditis and hypothyroidism. She was last seen here one year ago. In the interim, she has not had any further diagnostic studies except for a TSH level in November 2013 at the office of her primary care physician. This was normal at 1.54 on her present dose of Synthroid 112 mcg daily.  Patient notes that the cost of her medication has increased this year. She is interested in trying any generic thyroid medication.  PERTINENT REVIEW OF SYSTEMS: Denies tremors. Denies palpitations. Denies compressive symptoms. Denies pain. Denies new masses. Denies dysphagia.  EXAM: HEENT: normocephalic; pupils equal and reactive; sclerae clear; dentition good; mucous membranes moist NECK:  Palpation of the thyroid gland shows it to be slightly firm with the right lobe slightly larger than the left on palpation; mild asymmetric on extension; no palpable anterior or posterior cervical lymphadenopathy; no supraclavicular masses; no tenderness CHEST: clear to auscultation bilaterally without rales, rhonchi, or wheezes CARDIAC: regular rate and rhythm without significant murmur; peripheral pulses are full EXT:  non-tender without edema; no deformity NEURO: no gross focal deficits; no sign of tremor   IMPRESSION: #1 chronic lymphocytic thyroiditis, no dominant or discrete nodules #2 hypothyroidism  PLAN: I discussed the above findings with the patient. We reviewed her laboratory work. I am going to renew her prescription for levothyroxine 112 mcg daily and allow for generic substitution.  We will check a TSH level six weeks after changing to generic to make sure that her TSH remains in the range of 1.0-2.0.  She does not require any further imaging studies at this time.  Patient will return for follow-up and  thyroid physical examination in one year.  Earnstine Regal, MD, [facility] Office: [phone]

## 2012-05-10 ENCOUNTER — Encounter: Payer: Self-pay | Admitting: Internal Medicine

## 2012-05-10 NOTE — Assessment & Plan Note (Signed)
Low cholesterol diet and exercise.  Check lipid panel with next labs.  Last lipid panel 01/29/12 revealed total cholesterol 208, triglycerides 173, HDL 59 and LDL 126.

## 2012-05-10 NOTE — Progress Notes (Signed)
Subjective:    [name], female    DOB: Oct 31, 1928, 79 y.o.   MRN: [id number]  HPI 79 year old female with past history of hypertension, hypercholesterolemia, FCD, hypothyroidism and autoimmune hepatitis who comes in today to follow up on these issues as well as for a complete physical exam.  She states she has been doing relatively well.  She does report some increased drainage and sore throat.  Sinus headache.  Using saline.  Saw Dr [name] at the beginning of October.  Had an ultrasound.  Duct dilated.  Subsequently had an MRCP.  No abdominal pain or cramping.  No nausea or vomiting.  Bowels doing well.  Continues to see Dr [name].  Due follow up 4/14.     Past Medical History  Diagnosis Date  . Hypertension   . Hypercholesterolemia   . 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 Fibrocystic breast disease   . Osteoarthritis   . Inflammatory arthritis   . Autoimmune hepatitis     followed by Dr [name]     Current Outpatient Prescriptions on File Prior to Visit  Medication Sig Dispense Refill  . amitriptyline (ELAVIL) 10 MG tablet 2 at bedtime      . amLODipine (NORVASC) 10 MG tablet Take 1 tablet (10 mg total) by mouth daily.  30 tablet  6  . aspirin 81 MG tablet Take 81 mg by mouth daily.        . fexofenadine (ALLEGRA) 60 MG tablet Take 60 mg by mouth 2 (two) times daily.      Marland Kitchen gabapentin (NEURONTIN) 300 MG capsule Take 2 capsules qid      . METOPROLOL SUCCINATE PO Take 50 mg by mouth 2 (two) times daily.       Marland Kitchen omeprazole (PRILOSEC) 20 MG capsule Take 1 capsule (20 mg total) by mouth 2 (two) times daily.  60 capsule  5  . traMADol (ULTRAM) 50 MG tablet Take 1 tablet (50 mg total) by mouth 2 (two) times daily as needed for pain.  30 tablet  0  . ursodiol (ACTIGALL) 300 MG capsule Take 300 mg by mouth. 2 morning - 1 night        No current facility-administered medications on file prior to visit.     Review of Systems Patient denies any lightheadedness or dizziness.  Some sinus pressure and drainage.  Sore throat.  No chest pain, tightness or palpitations.  No increased shortness of breath, cough or congestion. No acid reflux or problems swallowing.  No nausea or vomiting.  No abdominal pain or cramping.  No bowel change, such as diarrhea, constipation, BRBPR or melana.  No urine change.        Objective:   Physical Exam  Filed Vitals:   05/01/12 1330  BP: 126/70  Pulse: 66  Temp: 98.2 F (36.8 C)   Pulse 72  79 year old female in no acute distress.   HEENT:  Nares- clear except for slightly erythematous turbinates.  Oropharynx - without lesions.  TMs visualized - without erythema.  Minimal tenderness to palpation over the sinuses.  NECK:  Supple.  Nontender.  No audible bruit.  HEART:  Appears to be regular. LUNGS:  No crackles or wheezing audible.  Respirations even and unlabored.  RADIAL PULSE:  Equal bilaterally.    BREASTS:  No nipple discharge or nipple retraction present.  Could not appreciate any distinct nodules or axillary adenopathy.  ABDOMEN:  Soft, nontender.  Bowel sounds present and normal.  No audible abdominal bruit.  GU:  Normal external genitalia.  Vaginal vault without lesions.  S/P hysterectomy.  Could not appreciate any adnexal masses or tenderness.   RECTAL:  Heme negative.   EXTREMITIES:  No increased edema present.  DP pulses palpable and equal bilaterally.           Assessment & Plan:  MSK.  Sees Dr Jefm Bryant.  Stable.   PROBABLE SINUSITIS.  Will have her use mucinex/robitussin as directed.  Saline nasal spray as directed.  Flonase.  Start with these measures first.  If symptoms do not improve or if they worsen, then rx given for amoxicillin to take if needed.  Notify me if problems.   CARDIOVASCULAR.  Currently asymptomatic.  Last ECHO revealed normal heart function with moderate to severe mitral regurgitation and moderate tricuspid regurgitation.   Follow.    HEALTH MAINTENANCE.  Physical today.  She is s/p hysterectomy.  Mammogram 03/24/12 - BiRADS II.

## 2012-05-10 NOTE — Assessment & Plan Note (Signed)
Seeing Dr Harlow Asa.  On Synthroid.  Stable.

## 2012-05-10 NOTE — Assessment & Plan Note (Signed)
Followed by Dr [name].  Recent ultrasound she reports revealed a dilated duct.  Had ERCP.  Currently doing well.  Has follow up planned with Dr [name] 4/14.

## 2012-05-10 NOTE — Assessment & Plan Note (Signed)
Received Reclast.

## 2012-05-10 NOTE — Assessment & Plan Note (Signed)
Blood pressure under reasonable control.  Same meds.  Check metabolic panel with next labs.

## 2012-05-27 ENCOUNTER — Telehealth: Payer: Self-pay | Admitting: *Deleted

## 2012-05-27 ENCOUNTER — Other Ambulatory Visit (INDEPENDENT_AMBULATORY_CARE_PROVIDER_SITE_OTHER): Payer: Medicare Other

## 2012-05-27 DIAGNOSIS — E039 Hypothyroidism, unspecified: Secondary | ICD-10-CM

## 2012-05-27 DIAGNOSIS — E063 Autoimmune thyroiditis: Secondary | ICD-10-CM

## 2012-05-27 NOTE — Telephone Encounter (Signed)
Clarified.  Dr Harlow Asa order in system

## 2012-05-27 NOTE — Telephone Encounter (Signed)
What labs did you want for this pt?

## 2012-05-28 LAB — TSH: TSH: 2.12 u[IU]/mL (ref 0.350–4.500)

## 2012-06-03 ENCOUNTER — Other Ambulatory Visit: Payer: Self-pay | Admitting: *Deleted

## 2012-06-05 MED ORDER — GABAPENTIN 300 MG PO CAPS: ORAL_CAPSULE | ORAL | Status: DC

## 2012-06-10 DIAGNOSIS — B351 Tinea unguium: Secondary | ICD-10-CM | POA: Diagnosis not present

## 2012-06-10 DIAGNOSIS — K745 Biliary cirrhosis, unspecified: Secondary | ICD-10-CM | POA: Diagnosis not present

## 2012-06-10 DIAGNOSIS — M79609 Pain in unspecified limb: Secondary | ICD-10-CM | POA: Diagnosis not present

## 2012-06-24 ENCOUNTER — Telehealth (INDEPENDENT_AMBULATORY_CARE_PROVIDER_SITE_OTHER): Payer: Self-pay

## 2012-06-24 NOTE — Telephone Encounter (Signed)
Per Dr Gala Lewandowsky request pt notified labs are good and no change in med at this time.

## 2012-07-15 ENCOUNTER — Other Ambulatory Visit: Payer: Self-pay | Admitting: Internal Medicine

## 2012-07-15 NOTE — Telephone Encounter (Signed)
Ok to refill 

## 2012-07-15 NOTE — Telephone Encounter (Signed)
Refilled amitriptyline 10mg  - 2 qhs #60 with 2 refills

## 2012-08-12 DIAGNOSIS — H2510 Age-related nuclear cataract, unspecified eye: Secondary | ICD-10-CM | POA: Diagnosis not present

## 2012-08-12 DIAGNOSIS — Z961 Presence of intraocular lens: Secondary | ICD-10-CM | POA: Diagnosis not present

## 2012-08-14 ENCOUNTER — Other Ambulatory Visit: Payer: Self-pay | Admitting: Internal Medicine

## 2012-08-14 NOTE — Telephone Encounter (Signed)
See my message on this pt.

## 2012-08-14 NOTE — Telephone Encounter (Signed)
Patient wants to get #150 with no refills is that okay to give to patient. I seen before she was getting #30 with 6 refills

## 2012-08-14 NOTE — Telephone Encounter (Signed)
I am ok if she get 150 at one time if needs to.  I am not sure that insurance will pay for this amount.

## 2012-08-17 ENCOUNTER — Other Ambulatory Visit: Payer: Self-pay | Admitting: Internal Medicine

## 2012-09-01 ENCOUNTER — Encounter: Payer: Self-pay | Admitting: Internal Medicine

## 2012-09-01 ENCOUNTER — Ambulatory Visit (INDEPENDENT_AMBULATORY_CARE_PROVIDER_SITE_OTHER): Payer: Medicare Other | Admitting: Internal Medicine

## 2012-09-01 ENCOUNTER — Other Ambulatory Visit: Payer: Medicare Other

## 2012-09-01 VITALS — BP 120/60 | HR 69 | Temp 98.8°F | Ht 63.0 in | Wt 153.5 lb

## 2012-09-01 DIAGNOSIS — E039 Hypothyroidism, unspecified: Secondary | ICD-10-CM | POA: Diagnosis not present

## 2012-09-01 DIAGNOSIS — M949 Disorder of cartilage, unspecified: Secondary | ICD-10-CM

## 2012-09-01 DIAGNOSIS — E063 Autoimmune thyroiditis: Secondary | ICD-10-CM | POA: Diagnosis not present

## 2012-09-01 DIAGNOSIS — I10 Essential (primary) hypertension: Secondary | ICD-10-CM

## 2012-09-01 DIAGNOSIS — K754 Autoimmune hepatitis: Secondary | ICD-10-CM

## 2012-09-01 DIAGNOSIS — M8580 Other specified disorders of bone density and structure, unspecified site: Secondary | ICD-10-CM

## 2012-09-01 DIAGNOSIS — M7989 Other specified soft tissue disorders: Secondary | ICD-10-CM | POA: Diagnosis not present

## 2012-09-01 DIAGNOSIS — E7800 Pure hypercholesterolemia, unspecified: Secondary | ICD-10-CM

## 2012-09-02 ENCOUNTER — Other Ambulatory Visit (INDEPENDENT_AMBULATORY_CARE_PROVIDER_SITE_OTHER): Payer: Medicare Other

## 2012-09-02 ENCOUNTER — Telehealth: Payer: Self-pay | Admitting: *Deleted

## 2012-09-02 DIAGNOSIS — M779 Enthesopathy, unspecified: Secondary | ICD-10-CM | POA: Diagnosis not present

## 2012-09-02 DIAGNOSIS — E039 Hypothyroidism, unspecified: Secondary | ICD-10-CM

## 2012-09-02 DIAGNOSIS — K754 Autoimmune hepatitis: Secondary | ICD-10-CM | POA: Diagnosis not present

## 2012-09-02 DIAGNOSIS — M7989 Other specified soft tissue disorders: Secondary | ICD-10-CM

## 2012-09-02 DIAGNOSIS — M899 Disorder of bone, unspecified: Secondary | ICD-10-CM | POA: Diagnosis not present

## 2012-09-02 DIAGNOSIS — E7800 Pure hypercholesterolemia, unspecified: Secondary | ICD-10-CM | POA: Diagnosis not present

## 2012-09-02 DIAGNOSIS — M8580 Other specified disorders of bone density and structure, unspecified site: Secondary | ICD-10-CM

## 2012-09-02 DIAGNOSIS — I10 Essential (primary) hypertension: Secondary | ICD-10-CM

## 2012-09-02 DIAGNOSIS — Q6689 Other  specified congenital deformities of feet: Secondary | ICD-10-CM | POA: Diagnosis not present

## 2012-09-02 LAB — BASIC METABOLIC PANEL
Calcium: 9.3 mg/dL (ref 8.4–10.5)
Chloride: 105 mEq/L (ref 96–112)
Creatinine, Ser: 0.8 mg/dL (ref 0.4–1.2)
GFR: 69.91 mL/min (ref >60.00)

## 2012-09-02 LAB — CBC WITH DIFFERENTIAL/PLATELET
Basophils Relative: 0.5 % (ref 0.0–3.0)
Eosinophils Absolute: 0.1 10*3/uL (ref 0.0–0.7)
HCT: 39.3 % (ref 36.0–46.0)
Hemoglobin: 13 g/dL (ref 12.0–15.0)
Lymphocytes Relative: 38.4 % (ref 12.0–46.0)
Lymphs Abs: 2.3 10*3/uL (ref 0.7–4.0)
MCHC: 33.1 g/dL (ref 30.0–36.0)
MCV: 99.3 fl (ref 78.0–100.0)
Monocytes Absolute: 0.5 10*3/uL (ref 0.1–1.0)
Neutro Abs: 3.1 10*3/uL (ref 1.4–7.7)
RBC: 3.96 Mil/uL (ref 3.87–5.11)
RDW: 13.4 % (ref 11.5–14.6)

## 2012-09-02 LAB — LIPID PANEL
Cholesterol: 227 mg/dL — ABNORMAL HIGH (ref 0–200)
Triglycerides: 128 mg/dL (ref 0.0–149.0)

## 2012-09-02 LAB — PROTIME-INR: Prothrombin Time: 9.7 s — ABNORMAL LOW (ref 10.2–12.4)

## 2012-09-02 MED ORDER — TRAMADOL HCL 50 MG PO TABS: 50.0000 mg | ORAL_TABLET | Freq: Two times a day (BID) | ORAL | Status: DC | PRN

## 2012-09-02 NOTE — Telephone Encounter (Signed)
Pt came from Dr. Loistine Simas and asked if you can add a pt/inr and if you can fill her    Refill Request   Tramodol  50 mg  Pharmacy: [facility] in [place]

## 2012-09-02 NOTE — Telephone Encounter (Signed)
I refilled her tramadol.  I put in order for the pt/inr.  Thanks.

## 2012-09-02 NOTE — Addendum Note (Signed)
Addended by: Karlene Einstein D on: 09/02/2012 02:53 PM   Modules accepted: Orders

## 2012-09-03 ENCOUNTER — Encounter: Payer: Self-pay | Admitting: *Deleted

## 2012-09-07 ENCOUNTER — Encounter: Payer: Self-pay | Admitting: Internal Medicine

## 2012-09-07 ENCOUNTER — Other Ambulatory Visit: Payer: Self-pay | Admitting: Internal Medicine

## 2012-09-07 NOTE — Assessment & Plan Note (Signed)
Seeing Dr Harlow Asa.  On Synthroid.  Stable.

## 2012-09-07 NOTE — Assessment & Plan Note (Addendum)
Followed by Dr [name].  Recent ultrasound she reports revealed a dilated duct.  Had MRCP.  Due to see Dr [name] tomorrow.  Currently asymptomatic.  Follow liver function.

## 2012-09-07 NOTE — Progress Notes (Signed)
Subjective:    [name], female    DOB: 09/21/1928, 79 y.o.   MRN: [id number]  HPI 79 year old female with past history of hypertension, hypercholesterolemia, FCD, hypothyroidism and autoimmune hepatitis who comes in today for a scheduled follow up.  Saw Dr [name] at the beginning of October.  Had an ultrasound.  Duct dilated.  Subsequently had an MRCP.  No abdominal pain or cramping.  No nausea or vomiting.  Bowels doing well.  Continues to see Dr [name].   Has follow up with Dr [name] tomorrow.  Just saw Dr Harlow Asa in 2/24.  States everything checked out fine.  She has noticed over the last 2-3 weeks - leg swelling.  States is better after she sleeps.  (better in the am).  Also has noticed that it feels as if her legs are going to give out when she walks.  No chest pain or tightness.  No sob.     Past Medical History  Diagnosis Date  . Hypertension   . Hypercholesterolemia   . 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 Fibrocystic breast disease   . Osteoarthritis   . Inflammatory arthritis   . Autoimmune hepatitis     followed by Dr [name]  . Neuropathy      Current Outpatient Prescriptions on File Prior to Visit  Medication Sig Dispense Refill  . amitriptyline (ELAVIL) 10 MG tablet TAKE 2 TABLETS BY MOUTH EVERY NIGHT AT BEDTIME  60 tablet  3  . amLODipine (NORVASC) 10 MG tablet TAKE 1 TABLET BY MOUTH EVERY DAY  150 tablet  0  . aspirin 81 MG tablet Take 81 mg by mouth daily.        . fexofenadine (ALLEGRA) 60 MG tablet Take 60 mg by mouth 2 (two) times daily.      Marland Kitchen gabapentin (NEURONTIN) 300 MG capsule Take 2 capsules qid  240 capsule  3  . levothyroxine (SYNTHROID, LEVOTHROID) 112 MCG tablet Take 1 tablet (112 mcg total) by mouth daily. 1 tablet  30 tablet  11  . metoprolol (LOPRESSOR) 50 MG tablet TAKE 1 TABLET BY MOUTH TWICE DAILY  120 tablet  1  . METOPROLOL SUCCINATE PO Take 50 mg by mouth 2 (two)  times daily.       Marland Kitchen omeprazole (PRILOSEC) 20 MG capsule Take 1 capsule (20 mg total) by mouth 2 (two) times daily.  60 capsule  5  . ursodiol (ACTIGALL) 300 MG capsule Take 300 mg by mouth. 2 morning - 1 night        No current facility-administered medications on file prior to visit.    Review of Systems Patient denies any lightheadedness or dizziness.  No significant sinus or allergy symptoms.  No chest pain, tightness or palpitations.  No increased shortness of breath, cough or congestion. No acid reflux or problems swallowing.  No nausea or vomiting.  No abdominal pain or cramping.  No bowel change, such as diarrhea, constipation, BRBPR or melana.  No urine change.  Leg swelling as outlined.  some leg fatigue as outlined.         Objective:   Physical Exam  Filed Vitals:   09/01/12 1357  BP: 120/60  Pulse: 69  Temp: 98.8 F (37.1 C)   Blood pressure recheck:  134/72, Pulse 65  79 year old female in no acute distress.   HEENT:  Nares- clear.  Oropharynx - without lesions.  NECK:  Supple.  Nontender.  No audible bruit.  HEART:  Appears to be regular. LUNGS:  No crackles or wheezing audible.  Respirations even and unlabored.  RADIAL PULSE:  Equal bilaterally. ABDOMEN:  Soft, nontender.  Bowel sounds present and normal.  No audible abdominal bruit.   EXTREMITIES:  Lower extremity edema.  No increased erythema or warmth.            Assessment & Plan:  LEG FATIGUE.  Notices with ambulation.  Will refer to vascular surgery for evaluation.    LEG SWELLING.  Improves over night.  Feel related to venostasis.  Continue leg elevation when sitting.  Compression hose.  Will refer to vascular surgery as outlined.    MSK.  Sees Dr Jefm Bryant.  Stable.    CARDIOVASCULAR.  Currently asymptomatic.  Last ECHO revealed normal heart function with moderate to severe mitral regurgitation and moderate tricuspid regurgitation.  Follow.    HEALTH MAINTENANCE.  Physical 05/01/12. .  She is s/p  hysterectomy.  Mammogram 03/24/12 - BiRADS II.

## 2012-09-07 NOTE — Assessment & Plan Note (Signed)
Low cholesterol diet and exercise.  Check lipid panel with next labs.

## 2012-09-07 NOTE — Assessment & Plan Note (Signed)
Blood pressure under reasonable control.  Same meds.  Check metabolic panel with next labs.

## 2012-09-07 NOTE — Assessment & Plan Note (Signed)
Received Reclast.

## 2012-09-14 DIAGNOSIS — M79609 Pain in unspecified limb: Secondary | ICD-10-CM | POA: Diagnosis not present

## 2012-09-14 DIAGNOSIS — I8310 Varicose veins of unspecified lower extremity with inflammation: Secondary | ICD-10-CM | POA: Diagnosis not present

## 2012-09-14 DIAGNOSIS — I872 Venous insufficiency (chronic) (peripheral): Secondary | ICD-10-CM | POA: Diagnosis not present

## 2012-09-14 DIAGNOSIS — M7989 Other specified soft tissue disorders: Secondary | ICD-10-CM | POA: Diagnosis not present

## 2012-09-15 ENCOUNTER — Telehealth: Payer: Self-pay | Admitting: *Deleted

## 2012-09-15 NOTE — Telephone Encounter (Signed)
Spoke with [facility]-Re: lower extremity Venous U/S from 09/01/12. Pt was seen on 09/14/12 by Dr. Delana Meyer, he has reviewed the U/S results with patient. No referral needed

## 2012-09-24 ENCOUNTER — Encounter: Payer: Self-pay | Admitting: Internal Medicine

## 2012-10-16 ENCOUNTER — Encounter: Payer: Self-pay | Admitting: Internal Medicine

## 2012-10-16 ENCOUNTER — Ambulatory Visit (INDEPENDENT_AMBULATORY_CARE_PROVIDER_SITE_OTHER): Payer: Medicare Other | Admitting: Internal Medicine

## 2012-10-16 VITALS — BP 126/58 | HR 74 | Temp 98.4°F | Resp 12 | Wt 150.0 lb

## 2012-10-16 DIAGNOSIS — E063 Autoimmune thyroiditis: Secondary | ICD-10-CM

## 2012-10-16 DIAGNOSIS — M8580 Other specified disorders of bone density and structure, unspecified site: Secondary | ICD-10-CM

## 2012-10-16 DIAGNOSIS — K754 Autoimmune hepatitis: Secondary | ICD-10-CM

## 2012-10-16 DIAGNOSIS — K219 Gastro-esophageal reflux disease without esophagitis: Secondary | ICD-10-CM

## 2012-10-16 DIAGNOSIS — I10 Essential (primary) hypertension: Secondary | ICD-10-CM

## 2012-10-16 DIAGNOSIS — M899 Disorder of bone, unspecified: Secondary | ICD-10-CM

## 2012-10-16 DIAGNOSIS — R079 Chest pain, unspecified: Secondary | ICD-10-CM

## 2012-10-16 DIAGNOSIS — E7800 Pure hypercholesterolemia, unspecified: Secondary | ICD-10-CM | POA: Diagnosis not present

## 2012-10-16 DIAGNOSIS — E039 Hypothyroidism, unspecified: Secondary | ICD-10-CM

## 2012-10-16 MED ORDER — GABAPENTIN 300 MG PO CAPS: ORAL_CAPSULE | ORAL | Status: DC

## 2012-10-18 ENCOUNTER — Encounter: Payer: Self-pay | Admitting: Internal Medicine

## 2012-10-18 DIAGNOSIS — K219 Gastro-esophageal reflux disease without esophagitis: Secondary | ICD-10-CM | POA: Insufficient documentation

## 2012-10-18 NOTE — Assessment & Plan Note (Signed)
Seeing Dr Harlow Asa.  On Synthroid.  Stable.

## 2012-10-18 NOTE — Assessment & Plan Note (Signed)
Received Reclast.

## 2012-10-18 NOTE — Assessment & Plan Note (Signed)
Blood pressure under reasonable control.  Same meds.  Check metabolic panel with next labs.

## 2012-10-18 NOTE — Progress Notes (Signed)
Subjective:    [name], female    DOB: 1928-10-25, 79 y.o.   MRN: [id number]  HPI 79 year old female with past history of hypertension, hypercholesterolemia, FCD, hypothyroidism and autoimmune hepatitis who comes in today for a scheduled follow up.  Saw Dr Ruth Roth at the beginning of October.  Had an ultrasound.  Duct dilated.  Subsequently had an MRCP.  Bowels doing well.  Continues to see Dr Ruth Roth.   She states that she has noticed recently having a gnawing pain in her stomach after eating.  She is taking prilosec bid.  Feels weak in her stomach.  She reports that a couple of days ago, she felt a discomfort in her lower anterior chest.  Felt as if she could not get a good breath.  This improved over the day, but she still felt bad the following day.  Feels some better today.  Some sob with exertion.  No increased cough or congestion.  Just saw Dr Ruth Roth in 2/24.  States everything checked out fine.  Leg swelling is better with the compression hose/socks.      Past Medical History  Diagnosis Date  . Hypertension   . Hypercholesterolemia   . 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 Fibrocystic breast disease   . Osteoarthritis   . Inflammatory arthritis   . Autoimmune hepatitis     followed by Dr Ruth Roth  . Neuropathy     Current Outpatient Prescriptions on File Prior to Visit  Medication Sig Dispense Refill  . amitriptyline (ELAVIL) 10 MG tablet TAKE 2 TABLETS BY MOUTH EVERY NIGHT AT BEDTIME  60 tablet  3  . amLODipine (NORVASC) 10 MG tablet TAKE 1 TABLET BY MOUTH EVERY DAY  150 tablet  0  . aspirin 81 MG tablet Take 81 mg by mouth daily.        . fexofenadine (ALLEGRA) 60 MG tablet Take 60 mg by mouth 2 (two) times daily.      Marland Kitchen levothyroxine (SYNTHROID, LEVOTHROID) 112 MCG tablet Take 1 tablet (112 mcg total) by mouth daily. 1 tablet  30 tablet  11  . METOPROLOL SUCCINATE PO Take 50 mg by mouth 2 (two) times  daily.       Marland Kitchen omeprazole (PRILOSEC) 20 MG capsule TAKE ONE CAPSULE BY MOUTH TWICE DAILY  60 capsule  5  . traMADol (ULTRAM) 50 MG tablet Take 1 tablet (50 mg total) by mouth 2 (two) times daily as needed for pain.  40 tablet  0  . ursodiol (ACTIGALL) 300 MG capsule Take 300 mg by mouth. 2 morning - 1 night        No current facility-administered medications on file prior to visit.    Review of Systems Patient denies any lightheadedness or dizziness.  No significant sinus or allergy symptoms.  No palpitations.  No cough or congestion.  Lower chest discomfort as outlined.  Some sob with exertion.  No acid reflux or problems swallowing.  No nausea or vomiting.   Does report gnawing pain and discomfort with eating.  On prilosec bid.  Feels weak in her stomach at times.  No urine change.  Swelling better.          Objective:   Physical Exam  Filed Vitals:   10/16/12 1150  BP: 126/58  Pulse: 74  Temp: 98.4 F (36.9 C)  Resp: 12   Blood pressure recheck:  132/68, Pulse 72  79  year old female in no acute distress.   HEENT:  Nares- clear.  Oropharynx - without lesions.  NECK:  Supple.  Nontender.  No audible bruit.  HEART:  Appears to be regular. LUNGS:  No crackles or wheezing audible.  Respirations even and unlabored.  RADIAL PULSE:  Equal bilaterally. ABDOMEN:  Soft, nontender.  Bowel sounds present and normal.  No audible abdominal bruit.   EXTREMITIES:  Lower extremity edema.  No increased erythema or warmth.            Assessment & Plan:  LEG SWELLING.  Swelling improved with compression socks/hose.  Saw vascular surgery.    CARDIOVASCULAR.  Has the sob with exertion and the recent chest pain/discomfort as outlined.  Still continues to not be back to baseline.  EKG obtained and revealed SR with no acute ischemic changes.  Given symptoms and risk factors, will obtain stress echo to confirm no active ischemia.  Has a history of moderate to severe mitral and tricuspid regurgitation.     MSK.  Sees Dr Ruth Roth.  Stable.   HEALTH MAINTENANCE.  Physical 05/01/12. .  She is s/p hysterectomy.  Mammogram 03/24/12 - BiRADS II.

## 2012-10-18 NOTE — Assessment & Plan Note (Signed)
Followed by Dr [name].  Last ultrasound she reports revealed a dilated duct.  Had MRCP.  Due in 10/14 for a scheduled follow up ultrasound.  With the pain after eating and current symptoms, will go ahead and repeat abdominal ultrasound.  Follow liver function.

## 2012-10-18 NOTE — Assessment & Plan Note (Signed)
On omeprazole bid.  Will change to taking before breakfast and before her evening meal.  Also instructed to take zantac before bed.  Obtain ultrasound as outlined.

## 2012-10-18 NOTE — Assessment & Plan Note (Signed)
Low cholesterol diet and exercise.  Follow lipid panel.   

## 2012-10-21 ENCOUNTER — Ambulatory Visit: Payer: Self-pay | Admitting: General Surgery

## 2012-10-22 ENCOUNTER — Encounter: Payer: Self-pay | Admitting: *Deleted

## 2012-10-22 ENCOUNTER — Ambulatory Visit: Payer: Self-pay | Admitting: Internal Medicine

## 2012-10-22 DIAGNOSIS — Z8619 Personal history of other infectious and parasitic diseases: Secondary | ICD-10-CM | POA: Diagnosis not present

## 2012-10-22 DIAGNOSIS — Z9089 Acquired absence of other organs: Secondary | ICD-10-CM | POA: Diagnosis not present

## 2012-10-22 DIAGNOSIS — K754 Autoimmune hepatitis: Secondary | ICD-10-CM | POA: Diagnosis not present

## 2012-10-22 DIAGNOSIS — R109 Unspecified abdominal pain: Secondary | ICD-10-CM | POA: Diagnosis not present

## 2012-10-26 DIAGNOSIS — M7520 Bicipital tendinitis, unspecified shoulder: Secondary | ICD-10-CM | POA: Diagnosis not present

## 2012-10-26 DIAGNOSIS — M75100 Unspecified rotator cuff tear or rupture of unspecified shoulder, not specified as traumatic: Secondary | ICD-10-CM | POA: Diagnosis not present

## 2012-10-29 ENCOUNTER — Ambulatory Visit: Payer: Self-pay | Admitting: Specialist

## 2012-10-29 DIAGNOSIS — S46819A Strain of other muscles, fascia and tendons at shoulder and upper arm level, unspecified arm, initial encounter: Secondary | ICD-10-CM | POA: Diagnosis not present

## 2012-10-29 DIAGNOSIS — M25519 Pain in unspecified shoulder: Secondary | ICD-10-CM | POA: Diagnosis not present

## 2012-10-29 DIAGNOSIS — M67919 Unspecified disorder of synovium and tendon, unspecified shoulder: Secondary | ICD-10-CM | POA: Diagnosis not present

## 2012-11-02 ENCOUNTER — Other Ambulatory Visit: Payer: Self-pay | Admitting: Internal Medicine

## 2012-11-03 ENCOUNTER — Other Ambulatory Visit: Payer: Self-pay | Admitting: Internal Medicine

## 2012-11-04 ENCOUNTER — Ambulatory Visit (INDEPENDENT_AMBULATORY_CARE_PROVIDER_SITE_OTHER): Payer: Medicare Other | Admitting: General Surgery

## 2012-11-04 ENCOUNTER — Encounter: Payer: Self-pay | Admitting: General Surgery

## 2012-11-04 VITALS — BP 118/62 | HR 62 | Resp 12 | Ht 63.0 in | Wt 149.0 lb

## 2012-11-04 DIAGNOSIS — K2970 Gastritis, unspecified, without bleeding: Secondary | ICD-10-CM

## 2012-11-04 NOTE — Patient Instructions (Addendum)
Patient to be scheduled for Upper GI scheduled.

## 2012-11-04 NOTE — Progress Notes (Signed)
[name], female   DOB: 1928-12-29, 79 y.o.   MRN: [id number]  Chief Complaint  Patient presents with  . Follow-up    colonoscopy    HPI Ruth Roth is a 79 y.o. female here today for an evaluation of an screening colonoscopy. Patient last colonoscopy was in 2009. No GI problems at this time.   HPI  Past Medical History  Diagnosis Date  . Hypertension   . Hypercholesterolemia   . 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 Fibrocystic breast disease   . Osteoarthritis   . Inflammatory arthritis   . Autoimmune hepatitis     followed by Dr [name]  . Neuropathy   . History of colon polyps 2000    Past Surgical History  Procedure Laterality Date  . Appendectomy    . Billroth    . Colonoscopy  2009  . Cholecystectomy  1998  . Upper gi endoscopy  2004  . Abdominal hysterectomy  1963    partial, secondary to fibroids    Family History  Problem Relation Age of Onset  . Stroke Mother   . Esophageal cancer Brother     also had lung cancer  . Breast cancer Neg Hx   . Colon cancer Neg Hx     Social History History  Substance Use Topics  . Smoking status: Never Smoker   . Smokeless tobacco: Never Used  . Alcohol Use: No    Allergies  Allergen Reactions  . Altace [Ramipril]   . Librax [Clidinium-Chlordiazepoxide]   . Statins     Affect her liver    Current Outpatient Prescriptions  Medication Sig Dispense Refill  . amitriptyline (ELAVIL) 10 MG tablet TAKE 2 TABLETS BY MOUTH EVERY NIGHT AT BEDTIME  60 tablet  5  . amLODipine (NORVASC) 10 MG tablet TAKE 1 TABLET BY MOUTH EVERY DAY  150 tablet  0  . aspirin 81 MG tablet Take 81 mg by mouth daily.        Marland Kitchen gabapentin (NEURONTIN) 300 MG capsule Take 2 capsules qid  240 capsule  3  . levothyroxine (SYNTHROID, LEVOTHROID) 112 MCG tablet Take 1 tablet (112 mcg total) by mouth daily. 1 tablet  30 tablet  11  . METOPROLOL SUCCINATE PO Take 50 mg by mouth 2  (two) times daily.       . Probiotic Product (ALIGN) 4 MG CAPS Take by mouth daily.      . ursodiol (ACTIGALL) 300 MG capsule Take 300 mg by mouth. 2 morning - 1 night        No current facility-administered medications for this visit.    Review of Systems Review of Systems  Constitutional: Negative.   Respiratory: Negative.   Cardiovascular: Negative.   Gastrointestinal: Negative.     Blood pressure 118/62, pulse 62, resp. rate 12, height 5\' 3"  (1.6 m), weight 149 lb (67.586 kg).  Physical Exam Physical Exam  Constitutional: She is oriented to person, place, and time. She appears well-developed and well-nourished.  Neck: No thyromegaly present.  Cardiovascular: Normal rate, regular rhythm and normal heart sounds.   No murmur heard. Pulmonary/Chest: Effort normal and breath sounds normal.  Lymphadenopathy:    She has no cervical adenopathy.  Neurological: She is alert and oriented to person, place, and time.  Skin: Skin is warm and dry.    Data Reviewed Colonoscopy dated September 22, 2012 was normal.  Upper endoscopy completed the same date  showed erythematous mucosa in the stomach. Biopsies showed chronic gastritis with moderate active inflammation, prominent foveal hyperplasia and reactive/regenerative epithelial changes. No evidence of H. Pylori infection.  Assessment    Followup upper endoscopy for malignant degeneration is indicated. A repeat colonoscopy is not required based on her previous negative exams and her present age, absence of clinical symptoms and negative family history..     Plan    An upper endoscopy will be completed at a convenient date.      Patient to check dates with daughter and call the office when ready to schedule.  Robert Bellow 11/05/2012, 7:20 PM

## 2012-11-05 ENCOUNTER — Encounter: Payer: Self-pay | Admitting: General Surgery

## 2012-11-05 DIAGNOSIS — M810 Age-related osteoporosis without current pathological fracture: Secondary | ICD-10-CM | POA: Diagnosis not present

## 2012-11-10 DIAGNOSIS — M7520 Bicipital tendinitis, unspecified shoulder: Secondary | ICD-10-CM | POA: Diagnosis not present

## 2012-11-10 DIAGNOSIS — M75100 Unspecified rotator cuff tear or rupture of unspecified shoulder, not specified as traumatic: Secondary | ICD-10-CM | POA: Diagnosis not present

## 2012-11-16 ENCOUNTER — Ambulatory Visit (INDEPENDENT_AMBULATORY_CARE_PROVIDER_SITE_OTHER): Payer: Medicare Other | Admitting: Internal Medicine

## 2012-11-16 ENCOUNTER — Encounter: Payer: Self-pay | Admitting: Internal Medicine

## 2012-11-16 VITALS — BP 120/70 | HR 62 | Temp 98.8°F | Ht 63.0 in | Wt 148.8 lb

## 2012-11-16 DIAGNOSIS — E063 Autoimmune thyroiditis: Secondary | ICD-10-CM

## 2012-11-16 DIAGNOSIS — M8580 Other specified disorders of bone density and structure, unspecified site: Secondary | ICD-10-CM

## 2012-11-16 DIAGNOSIS — M899 Disorder of bone, unspecified: Secondary | ICD-10-CM

## 2012-11-16 DIAGNOSIS — K754 Autoimmune hepatitis: Secondary | ICD-10-CM

## 2012-11-16 DIAGNOSIS — I10 Essential (primary) hypertension: Secondary | ICD-10-CM | POA: Diagnosis not present

## 2012-11-16 DIAGNOSIS — E039 Hypothyroidism, unspecified: Secondary | ICD-10-CM

## 2012-11-16 DIAGNOSIS — E7800 Pure hypercholesterolemia, unspecified: Secondary | ICD-10-CM

## 2012-11-16 DIAGNOSIS — K219 Gastro-esophageal reflux disease without esophagitis: Secondary | ICD-10-CM

## 2012-11-16 NOTE — Progress Notes (Signed)
Subjective:    [name], female    DOB: 12-16-1928, 79 y.o.   MRN: [id number]  HPI 79 year old female with past history of hypertension, hypercholesterolemia, FCD, hypothyroidism and autoimmune hepatitis who comes in today for a scheduled follow up.  Saw Dr Ruth Roth at the beginning of October.  Had an ultrasound.  Duct dilated.  Subsequently had an MRCP.  Continues to see Dr Ruth Roth.  Was having a gnawing sensation and discomfort - last visit.  See last note for details.  Saw Dr Ruth Roth and is scheduled for an EGD in the future.  Was also noticing some sob.  Planning for stress test next week.  Feels better than last visit.  Has noticed some loose stool - starting last week.  Having three watery stools per day.  No increased cough or congestion, but does report increased drainage.  Persistent.  Just saw Dr Ruth Roth in 2/24.  States everything checked out fine.  Leg swelling is better with the compression hose/socks.      Past Medical History  Diagnosis Date  . Hypertension   . Hypercholesterolemia   . 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 Fibrocystic breast disease   . Osteoarthritis   . Inflammatory arthritis   . Autoimmune hepatitis     followed by Dr Ruth Roth  . Neuropathy   . History of colon polyps 2000    Current Outpatient Prescriptions on File Prior to Visit  Medication Sig Dispense Refill  . amitriptyline (ELAVIL) 10 MG tablet TAKE 2 TABLETS BY MOUTH EVERY NIGHT AT BEDTIME  60 tablet  5  . amLODipine (NORVASC) 10 MG tablet TAKE 1 TABLET BY MOUTH EVERY DAY  150 tablet  0  . aspirin 81 MG tablet Take 81 mg by mouth daily.        Marland Kitchen gabapentin (NEURONTIN) 300 MG capsule Take 2 capsules qid  240 capsule  3  . levothyroxine (SYNTHROID, LEVOTHROID) 112 MCG tablet Take 1 tablet (112 mcg total) by mouth daily. 1 tablet  30 tablet  11  . METOPROLOL SUCCINATE PO Take 50 mg by mouth 2 (two) times daily.       .  Probiotic Product (ALIGN) 4 MG CAPS Take by mouth daily.      . ursodiol (ACTIGALL) 300 MG capsule Take 300 mg by mouth. 2 morning - 1 night        No current facility-administered medications on file prior to visit.    Review of Systems Patient denies any lightheadedness or dizziness.  Increased drainage.  No palpitations.  No cough or congestion.  No chest discomfort.  Breathing is better.   No acid reflux or problems swallowing.  No nausea or vomiting.   The gnawing sensation is better.  On omeprazole bid and ranitidine at night.  Loose stool as directed.   No urine change.  Swelling better.          Objective:   Physical Exam  Filed Vitals:   11/16/12 1347  BP: 120/70  Pulse: 62  Temp: 98.8 F (37.1 C)   Blood pressure recheck:  27/87  79 year old female in no acute distress.   HEENT:  Nares- clear.  Oropharynx - without lesions.  NECK:  Supple.  Nontender.  No audible bruit.  HEART:  Appears to be regular. LUNGS:  No crackles or wheezing audible.  Respirations even and unlabored.  RADIAL PULSE:  Equal bilaterally. ABDOMEN:  Soft, nontender.  Bowel sounds present and normal.  No audible abdominal bruit.   EXTREMITIES:  Lower extremity edema.  Improved.   No increased erythema or warmth.            Assessment & Plan:  LEG SWELLING.  Swelling improved with compression socks/hose.  Saw vascular surgery.    PROBABLE ALLERGIES.  Samples of Dymista given.  Call with update.    CARDIOVASCULAR.  Planning for stress test next week.  Follow.    MSK.  Sees Dr Jefm Bryant.  Stable.   HEALTH MAINTENANCE.  Physical 05/01/12. .  She is s/p hysterectomy.  Mammogram 03/24/12 - BiRADS II.

## 2012-11-16 NOTE — Assessment & Plan Note (Signed)
Blood pressure under reasonable control.  Same meds.  Check metabolic panel with next labs.

## 2012-11-16 NOTE — Assessment & Plan Note (Addendum)
On omeprazole bid and ranitidine at night.  Symptoms have improved.  Planning for upcoming EGD.

## 2012-11-18 ENCOUNTER — Encounter: Payer: Self-pay | Admitting: Internal Medicine

## 2012-11-18 NOTE — Assessment & Plan Note (Signed)
Seeing Dr Harlow Asa.  On Synthroid.  Stable.

## 2012-11-18 NOTE — Assessment & Plan Note (Signed)
Low cholesterol diet and exercise.  Follow lipid panel.   

## 2012-11-18 NOTE — Assessment & Plan Note (Signed)
Received Reclast.

## 2012-11-18 NOTE — Assessment & Plan Note (Signed)
Followed by Dr [name].  prevdious ultrasound she reports revealed a dilated duct.  Had MRCP.  Was due in 10/14 for a scheduled follow up ultrasound.  We had her do this early given symptoms last visit.  Doing better.  Abdominal ultrasound revealed no acute change.  Saw Dr Bary Castilla.  EGD planned in future.

## 2012-11-19 ENCOUNTER — Other Ambulatory Visit: Payer: Medicare Other

## 2012-11-20 ENCOUNTER — Other Ambulatory Visit: Payer: Medicare Other

## 2012-12-08 ENCOUNTER — Telehealth: Payer: Self-pay | Admitting: *Deleted

## 2012-12-08 NOTE — Telephone Encounter (Signed)
Patient has been scheduled for an upper endoscopy on 01-12-13 at [facility]. She will be contacted prior to verify no medication changes since last office visit.

## 2012-12-11 ENCOUNTER — Other Ambulatory Visit: Payer: Medicare Other

## 2012-12-13 ENCOUNTER — Other Ambulatory Visit: Payer: Self-pay | Admitting: Internal Medicine

## 2012-12-17 ENCOUNTER — Other Ambulatory Visit (INDEPENDENT_AMBULATORY_CARE_PROVIDER_SITE_OTHER): Payer: Medicare Other

## 2012-12-17 DIAGNOSIS — E7800 Pure hypercholesterolemia, unspecified: Secondary | ICD-10-CM | POA: Diagnosis not present

## 2012-12-17 DIAGNOSIS — I10 Essential (primary) hypertension: Secondary | ICD-10-CM | POA: Diagnosis not present

## 2012-12-17 LAB — COMPREHENSIVE METABOLIC PANEL
AST: 10 U/L (ref 0–37)
Albumin: 4.3 g/dL (ref 3.5–5.2)
BUN: 27 mg/dL — ABNORMAL HIGH (ref 6–23)
Calcium: 8.9 mg/dL (ref 8.4–10.5)
Chloride: 110 mEq/L (ref 96–112)
Glucose, Bld: 93 mg/dL (ref 70–99)
Potassium: 4.8 mEq/L (ref 3.5–5.1)
Sodium: 140 mEq/L (ref 135–145)
Total Protein: 7 g/dL (ref 6.0–8.3)

## 2012-12-17 LAB — LIPID PANEL: Triglycerides: 169 mg/dL — ABNORMAL HIGH (ref 0.0–149.0)

## 2012-12-18 ENCOUNTER — Other Ambulatory Visit (INDEPENDENT_AMBULATORY_CARE_PROVIDER_SITE_OTHER): Payer: Medicare Other

## 2012-12-18 DIAGNOSIS — R079 Chest pain, unspecified: Secondary | ICD-10-CM

## 2012-12-18 LAB — LDL CHOLESTEROL, DIRECT: Direct LDL: 143.5 mg/dL

## 2012-12-20 ENCOUNTER — Other Ambulatory Visit: Payer: Self-pay | Admitting: General Surgery

## 2012-12-20 DIAGNOSIS — K2970 Gastritis, unspecified, without bleeding: Secondary | ICD-10-CM

## 2012-12-21 DIAGNOSIS — M79609 Pain in unspecified limb: Secondary | ICD-10-CM | POA: Diagnosis not present

## 2012-12-21 DIAGNOSIS — I8310 Varicose veins of unspecified lower extremity with inflammation: Secondary | ICD-10-CM | POA: Diagnosis not present

## 2012-12-21 DIAGNOSIS — I872 Venous insufficiency (chronic) (peripheral): Secondary | ICD-10-CM | POA: Diagnosis not present

## 2012-12-21 DIAGNOSIS — M7989 Other specified soft tissue disorders: Secondary | ICD-10-CM | POA: Diagnosis not present

## 2012-12-22 ENCOUNTER — Other Ambulatory Visit: Payer: Self-pay | Admitting: Internal Medicine

## 2012-12-22 DIAGNOSIS — N289 Disorder of kidney and ureter, unspecified: Secondary | ICD-10-CM

## 2012-12-22 NOTE — Progress Notes (Signed)
Order placed for f/u met b.  

## 2012-12-24 ENCOUNTER — Encounter (INDEPENDENT_AMBULATORY_CARE_PROVIDER_SITE_OTHER): Payer: Self-pay

## 2012-12-24 ENCOUNTER — Encounter: Payer: Self-pay | Admitting: Internal Medicine

## 2012-12-24 ENCOUNTER — Ambulatory Visit (INDEPENDENT_AMBULATORY_CARE_PROVIDER_SITE_OTHER): Payer: Medicare Other | Admitting: Internal Medicine

## 2012-12-24 VITALS — BP 130/70 | HR 60 | Temp 98.4°F | Ht 63.0 in | Wt 149.2 lb

## 2012-12-24 DIAGNOSIS — E7800 Pure hypercholesterolemia, unspecified: Secondary | ICD-10-CM

## 2012-12-24 DIAGNOSIS — N289 Disorder of kidney and ureter, unspecified: Secondary | ICD-10-CM | POA: Diagnosis not present

## 2012-12-24 DIAGNOSIS — R51 Headache: Secondary | ICD-10-CM | POA: Diagnosis not present

## 2012-12-24 DIAGNOSIS — E039 Hypothyroidism, unspecified: Secondary | ICD-10-CM

## 2012-12-24 DIAGNOSIS — K219 Gastro-esophageal reflux disease without esophagitis: Secondary | ICD-10-CM

## 2012-12-24 DIAGNOSIS — E063 Autoimmune thyroiditis: Secondary | ICD-10-CM

## 2012-12-24 DIAGNOSIS — Z23 Encounter for immunization: Secondary | ICD-10-CM

## 2012-12-24 DIAGNOSIS — I10 Essential (primary) hypertension: Secondary | ICD-10-CM

## 2012-12-24 DIAGNOSIS — M899 Disorder of bone, unspecified: Secondary | ICD-10-CM

## 2012-12-24 DIAGNOSIS — K754 Autoimmune hepatitis: Secondary | ICD-10-CM

## 2012-12-24 DIAGNOSIS — M8580 Other specified disorders of bone density and structure, unspecified site: Secondary | ICD-10-CM

## 2012-12-24 LAB — BASIC METABOLIC PANEL
BUN: 24 mg/dL — ABNORMAL HIGH (ref 6–23)
Calcium: 9.1 mg/dL (ref 8.4–10.5)
GFR: 50.47 mL/min — ABNORMAL LOW (ref >60.00)
Glucose, Bld: 77 mg/dL (ref 70–99)
Potassium: 4.9 mEq/L (ref 3.5–5.1)

## 2012-12-25 ENCOUNTER — Other Ambulatory Visit: Payer: Self-pay | Admitting: Internal Medicine

## 2012-12-25 DIAGNOSIS — N289 Disorder of kidney and ureter, unspecified: Secondary | ICD-10-CM

## 2012-12-25 NOTE — Progress Notes (Signed)
Order placed for f/u met b.  

## 2012-12-31 ENCOUNTER — Encounter: Payer: Self-pay | Admitting: Internal Medicine

## 2012-12-31 NOTE — Assessment & Plan Note (Signed)
Low cholesterol diet and exercise.  Follow lipid panel.    Recent check revealed total cholesterol 221, triglycerides 169, HDL 53 and LdL 143.

## 2012-12-31 NOTE — Assessment & Plan Note (Signed)
Check ESR.  Continue saline and treatment for allergies.  Further w/up pending results.

## 2012-12-31 NOTE — Assessment & Plan Note (Addendum)
Blood pressure under reasonable control.  Same meds.  Follow.  Cr slightly increased on recent lab check.  Stay hydrated.  Repeat.

## 2012-12-31 NOTE — Assessment & Plan Note (Signed)
Seeing Dr Harlow Asa.  On Synthroid.  Stable.

## 2012-12-31 NOTE — Assessment & Plan Note (Signed)
On omeprazole bid and ranitidine at night.  Symptoms have improved.

## 2012-12-31 NOTE — Progress Notes (Signed)
Subjective:    [name], female    DOB: [birth year], 79 y.o.   MRN: [id number]  HPI 79 year old female with past history of hypertension, hypercholesterolemia, FCD, hypothyroidism and autoimmune hepatitis who comes in today for a scheduled follow up.  Saw Dr [name].  Had an ultrasound.  Duct dilated.  Subsequently had an MRCP.  Continues to see Dr [name].    Saw Dr Harlow Asa 05/04/12.   States everything checked out fine.  Leg swelling is better with the compression hose/socks.   She had a stress test recently.  Do not have results.  Feels better.  No increased sob.  Eating and drinking well.  Bowels appear to be stable.  She is having some headaches.  Has noticed over the last few days.  No significant sinus symptoms.     Past Medical History  Diagnosis Date  . Hypertension   . Hypercholesterolemia   . 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 Fibrocystic breast disease   . Osteoarthritis   . Inflammatory arthritis   . Autoimmune hepatitis     followed by Dr [name]  . Neuropathy   . History of colon polyps 2000    Current Outpatient Prescriptions on File Prior to Visit  Medication Sig Dispense Refill  . amitriptyline (ELAVIL) 10 MG tablet TAKE 2 TABLETS BY MOUTH EVERY NIGHT AT BEDTIME  60 tablet  5  . amLODipine (NORVASC) 10 MG tablet TAKE 1 TABLET BY MOUTH EVERY DAY  150 tablet  0  . aspirin 81 MG tablet Take 81 mg by mouth daily.        Marland Kitchen gabapentin (NEURONTIN) 300 MG capsule Take 2 capsules qid  240 capsule  3  . levothyroxine (SYNTHROID, LEVOTHROID) 112 MCG tablet Take 1 tablet (112 mcg total) by mouth daily. 1 tablet  30 tablet  11  . metoprolol (LOPRESSOR) 50 MG tablet TAKE 1 TABLET BY MOUTH TWICE DAILY  120 tablet  2  . Probiotic Product (ALIGN) 4 MG CAPS Take by mouth daily.      . ursodiol (ACTIGALL) 300 MG capsule Take 300 mg by mouth. 2 morning - 1 night        No current facility-administered  medications on file prior to visit.    Review of Systems Patient denies any lightheadedness or dizziness.  No significant sinus symptoms.  No chest discomfort.   No palpitations.  No cough or congestion.  Breathing appears to be stable.   No acid reflux or problems swallowing.  No nausea or vomiting.   The gnawing sensation is better.  On omeprazole bid and ranitidine at night.  No urine change.  Swelling better.   Headache as outlined.         Objective:   Physical Exam  Filed Vitals:   12/24/12 1402  BP: 130/70  Pulse: 60  Temp: 98.4 F (98.71 C)   79 year old female in no acute distress.   HEENT:  Nares- clear.  Oropharynx - without lesions.  NECK:  Supple.  Nontender.  No audible bruit.  HEART:  Appears to be regular. LUNGS:  No crackles or wheezing audible.  Respirations even and unlabored.  RADIAL PULSE:  Equal bilaterally. ABDOMEN:  Soft, nontender.  Bowel sounds present and normal.  No audible abdominal bruit.   EXTREMITIES:  Lower extremity edema.  Improved.   No increased erythema or warmth.  Assessment & Plan:  LEG SWELLING.  Swelling improved with compression socks/hose.  Saw vascular surgery.    PROBABLE ALLERGIES.  Dymista works well for her.      CARDIOVASCULAR.  Had stress test.  Need results.  Called cardiology.  Not available yet.    MSK.  Sees Dr Jefm Bryant.  Stable.   HEALTH MAINTENANCE.  Physical 05/01/12. .  She is s/p hysterectomy.  Mammogram 03/24/12 - BiRADS II.

## 2012-12-31 NOTE — Assessment & Plan Note (Signed)
Followed by Dr [name].  prevdious ultrasound she reports revealed a dilated duct.  Had MRCP.  Was due in 10/14 for a scheduled follow up ultrasound.  We had her do this early given symptoms last visit.  Doing better.  Abdominal ultrasound revealed no acute change.  Saw Dr Bary Castilla.

## 2012-12-31 NOTE — Assessment & Plan Note (Signed)
Received Reclast.

## 2013-01-02 ENCOUNTER — Other Ambulatory Visit: Payer: Self-pay | Admitting: Internal Medicine

## 2013-01-06 ENCOUNTER — Telehealth: Payer: Self-pay | Admitting: Internal Medicine

## 2013-01-06 DIAGNOSIS — M75100 Unspecified rotator cuff tear or rupture of unspecified shoulder, not specified as traumatic: Secondary | ICD-10-CM | POA: Diagnosis not present

## 2013-01-06 DIAGNOSIS — M7520 Bicipital tendinitis, unspecified shoulder: Secondary | ICD-10-CM | POA: Diagnosis not present

## 2013-01-06 NOTE — Telephone Encounter (Signed)
Pt had Stress Test done back on 12/18/12 and still has not received her results. I see the test in her chart but patient hasn't gotten her results yet.

## 2013-01-06 NOTE — Telephone Encounter (Signed)
Please notify pt that her stress test was negative.  (looked good).

## 2013-01-07 ENCOUNTER — Telehealth: Payer: Self-pay | Admitting: *Deleted

## 2013-01-07 NOTE — Telephone Encounter (Signed)
Pt.notified

## 2013-01-07 NOTE — Telephone Encounter (Signed)
Patient called to cancel upper endoscopy that was scheduled for 01-12-13 at [facility] due to a sick dog who may have to have surgery. This patient was instructed to call the office when she is ready to reschedule.  Ivin Booty in endoscopy notified of cancellation.

## 2013-01-25 ENCOUNTER — Other Ambulatory Visit (INDEPENDENT_AMBULATORY_CARE_PROVIDER_SITE_OTHER): Payer: Medicare Other

## 2013-01-25 DIAGNOSIS — N289 Disorder of kidney and ureter, unspecified: Secondary | ICD-10-CM

## 2013-01-26 ENCOUNTER — Encounter: Payer: Self-pay | Admitting: *Deleted

## 2013-01-26 LAB — BASIC METABOLIC PANEL
CO2: 20 mEq/L (ref 19–32)
Calcium: 8.6 mg/dL (ref 8.4–10.5)
Creatinine, Ser: 1.1 mg/dL (ref 0.4–1.2)
GFR: 53.24 mL/min — ABNORMAL LOW (ref >60.00)
Potassium: 4.5 mEq/L (ref 3.5–5.1)
Sodium: 139 mEq/L (ref 135–145)

## 2013-02-10 DIAGNOSIS — K745 Biliary cirrhosis, unspecified: Secondary | ICD-10-CM | POA: Diagnosis not present

## 2013-02-10 DIAGNOSIS — K754 Autoimmune hepatitis: Secondary | ICD-10-CM | POA: Diagnosis not present

## 2013-02-11 ENCOUNTER — Other Ambulatory Visit (INDEPENDENT_AMBULATORY_CARE_PROVIDER_SITE_OTHER): Payer: Self-pay

## 2013-02-11 ENCOUNTER — Telehealth (INDEPENDENT_AMBULATORY_CARE_PROVIDER_SITE_OTHER): Payer: Self-pay

## 2013-02-11 DIAGNOSIS — E039 Hypothyroidism, unspecified: Secondary | ICD-10-CM

## 2013-02-11 MED ORDER — LEVOTHYROXINE SODIUM 112 MCG PO TABS: 112.0000 ug | ORAL_TABLET | Freq: Every day | ORAL | Status: DC

## 2013-02-11 NOTE — Telephone Encounter (Signed)
Received refill request for Levothyroxine 112 mcg #30 from [facility]. Request forwarded to Dr Harlow Asa to review.

## 2013-02-11 NOTE — Telephone Encounter (Signed)
Electronic refill for one year sent to [facility] per Dr Gala Lewandowsky request.

## 2013-02-11 NOTE — Telephone Encounter (Signed)
Cindy - OK to renew patient's levothyroxine 112 mcg daily for one year.  Thanks,  tmg  Earnstine Regal, MD, [facility] Office: [phone]

## 2013-02-23 DIAGNOSIS — R05 Cough: Secondary | ICD-10-CM | POA: Diagnosis not present

## 2013-02-23 DIAGNOSIS — J019 Acute sinusitis, unspecified: Secondary | ICD-10-CM | POA: Diagnosis not present

## 2013-02-24 ENCOUNTER — Encounter: Payer: Self-pay | Admitting: *Deleted

## 2013-02-25 ENCOUNTER — Encounter: Payer: Self-pay | Admitting: Internal Medicine

## 2013-02-25 ENCOUNTER — Ambulatory Visit (INDEPENDENT_AMBULATORY_CARE_PROVIDER_SITE_OTHER): Payer: Medicare Other | Admitting: Internal Medicine

## 2013-02-25 ENCOUNTER — Telehealth: Payer: Self-pay | Admitting: Internal Medicine

## 2013-02-25 VITALS — BP 130/70 | HR 60 | Temp 98.5°F | Ht 63.0 in | Wt 144.8 lb

## 2013-02-25 DIAGNOSIS — K754 Autoimmune hepatitis: Secondary | ICD-10-CM

## 2013-02-25 DIAGNOSIS — I10 Essential (primary) hypertension: Secondary | ICD-10-CM | POA: Diagnosis not present

## 2013-02-25 DIAGNOSIS — M8580 Other specified disorders of bone density and structure, unspecified site: Secondary | ICD-10-CM

## 2013-02-25 DIAGNOSIS — J329 Chronic sinusitis, unspecified: Secondary | ICD-10-CM

## 2013-02-25 DIAGNOSIS — E039 Hypothyroidism, unspecified: Secondary | ICD-10-CM

## 2013-02-25 DIAGNOSIS — Z1239 Encounter for other screening for malignant neoplasm of breast: Secondary | ICD-10-CM | POA: Diagnosis not present

## 2013-02-25 DIAGNOSIS — E7800 Pure hypercholesterolemia, unspecified: Secondary | ICD-10-CM | POA: Diagnosis not present

## 2013-02-25 DIAGNOSIS — R51 Headache: Secondary | ICD-10-CM

## 2013-02-25 DIAGNOSIS — K219 Gastro-esophageal reflux disease without esophagitis: Secondary | ICD-10-CM

## 2013-02-25 DIAGNOSIS — M899 Disorder of bone, unspecified: Secondary | ICD-10-CM

## 2013-02-25 MED ORDER — BENZONATATE 100 MG PO CAPS: 100.0000 mg | ORAL_CAPSULE | Freq: Three times a day (TID) | ORAL | Status: DC | PRN

## 2013-02-25 NOTE — Telephone Encounter (Signed)
Pt was seen today.  Wants [place] to schedule her mammogram at [facility] and call her with the appt info.

## 2013-02-25 NOTE — Progress Notes (Signed)
Pre-visit discussion using our clinic review tool. No additional management support is needed unless otherwise documented below in the visit note.  

## 2013-02-26 ENCOUNTER — Encounter: Payer: Self-pay | Admitting: Emergency Medicine

## 2013-02-28 ENCOUNTER — Encounter: Payer: Self-pay | Admitting: Internal Medicine

## 2013-02-28 NOTE — Assessment & Plan Note (Addendum)
Already on amoxicillin.  Saline nasal flushes and flonase as directed.  Robitussin as directed.  Follow.  Some increased cough.  Tessalon Perles as directed.  Follow.

## 2013-02-28 NOTE — Assessment & Plan Note (Signed)
Received Reclast.

## 2013-02-28 NOTE — Assessment & Plan Note (Signed)
Low cholesterol diet and exercise.  Follow lipid panel.   

## 2013-02-28 NOTE — Assessment & Plan Note (Signed)
Seeing Dr Harlow Asa.  On Synthroid.  Stable.

## 2013-02-28 NOTE — Assessment & Plan Note (Addendum)
On omeprazole bid and ranitidine at night.  Symptoms controlled.

## 2013-02-28 NOTE — Assessment & Plan Note (Signed)
Resolved.  Complete treatment for sinus infection.

## 2013-02-28 NOTE — Progress Notes (Signed)
Subjective:    [name], female    DOB: 07-02-1928, 79 y.o.   MRN: [id number]  HPI 79 year old female with past history of hypertension, hypercholesterolemia, FCD, hypothyroidism and autoimmune hepatitis who comes in today for a scheduled follow up.  Saw Dr [name].  Had an ultrasound.  Duct dilated.  Subsequently had an MRCP.  Continues to see Dr [name].    Saw Dr Harlow Asa 05/04/12.   States everything checked out fine.  Leg swelling is better with the compression hose/socks.   She had a stress test recently.  Stress test negative. Feels better.  No increased sob.  Eating and drinking well.  Bowels appear to be stable.  She reports that over this past week, she has noticed some increased drainage and hoarseness.  Some sore throat.  Head pressure.  Increased cough.  No vomiting or diarrhea.  Taking amoxicillin.  Feels some better.  No sob.  Eating and drinking well.  Bowels stable.    Past Medical History  Diagnosis Date  . Hypertension   . Hypercholesterolemia   . 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 Fibrocystic breast disease   . Osteoarthritis   . Inflammatory arthritis   . Autoimmune hepatitis     followed by Dr [name]  . Neuropathy   . History of colon polyps 2000    Current Outpatient Prescriptions on File Prior to Visit  Medication Sig Dispense Refill  . amitriptyline (ELAVIL) 10 MG tablet TAKE 2 TABLETS BY MOUTH EVERY NIGHT AT BEDTIME  60 tablet  5  . amLODipine (NORVASC) 10 MG tablet TAKE 1 TABLET BY MOUTH EVERY DAY  90 tablet  1  . aspirin 81 MG tablet Take 81 mg by mouth daily.        Marland Kitchen gabapentin (NEURONTIN) 300 MG capsule Take 2 capsules qid  240 capsule  3  . levothyroxine (SYNTHROID, LEVOTHROID) 112 MCG tablet Take 1 tablet (112 mcg total) by mouth daily. 1 tablet  30 tablet  11  . metoprolol (LOPRESSOR) 50 MG tablet TAKE 1 TABLET BY MOUTH TWICE DAILY  120 tablet  2  . Probiotic Product (ALIGN) 4  MG CAPS Take by mouth daily.      . ursodiol (ACTIGALL) 300 MG capsule Take 300 mg by mouth. 2 morning - 1 night        No current facility-administered medications on file prior to visit.    Review of Systems Patient denies any lightheadedness or dizziness.  No significant sinus symptoms.  No chest discomfort.   No palpitations.  Increased drainage and hoarseness.  No sore throat.  Head presssure.  Increased cough.  Breathing appears to be stable.  No sob.   No acid reflux or problems swallowing.  No nausea or vomiting.   On omeprazole bid and ranitidine at night.  No urine change.  Swelling better.  Previous headache resolved.         Objective:   Physical Exam  Filed Vitals:   02/25/13 1351  BP: 130/70  Pulse: 60  Temp: 98.5 F (54.42 C)   79 year old female in no acute distress.   HEENT:  Nares- erythematous turbinates.  Oropharynx - without lesions.  TMs visualized without erythema.  Minimal tenderness to palpation over the sinuses.   NECK:  Supple.  Nontender.  No audible bruit.  HEART:  Appears to be regular. LUNGS:  No crackles or wheezing audible.  Respirations even and unlabored.  RADIAL PULSE:  Equal bilaterally. ABDOMEN:  Soft, nontender.  Bowel sounds present and normal.  No audible abdominal bruit.   EXTREMITIES:  Lower extremity edema.  Improved.   No increased erythema or warmth.            Assessment & Plan:  LEG SWELLING.  Swelling improved with compression socks/hose.  Saw vascular surgery.    CARDIOVASCULAR.  Had stress test.  Negative.  Currently asymptomatic.     MSK.  Sees Dr Jefm Bryant.  Stable.   HEALTH MAINTENANCE.  Physical 05/01/12. .  She is s/p hysterectomy.  Mammogram 03/24/12 - BiRADS II.  Schedule a f/u mammogram.

## 2013-02-28 NOTE — Assessment & Plan Note (Signed)
Blood pressure under reasonable control.  Same meds.  Follow.  Cr slightly increased on recent lab check.  Recheck stable.  Stay hydrated.  Follow.  Cr 1.1.

## 2013-02-28 NOTE — Assessment & Plan Note (Signed)
Followed by Dr [name].  prevdious ultrasound she reports revealed a dilated duct.  Had MRCP.  Was due in 10/14 for a scheduled follow up ultrasound.  We had her do this early given symptoms last visit.  Doing better.  Abdominal ultrasound revealed no acute change.  Saw Dr Bary Castilla.  Currently asymptomatic.  Follow.

## 2013-03-05 ENCOUNTER — Other Ambulatory Visit: Payer: Self-pay | Admitting: Internal Medicine

## 2013-03-08 ENCOUNTER — Other Ambulatory Visit: Payer: Self-pay | Admitting: Internal Medicine

## 2013-03-08 NOTE — Telephone Encounter (Signed)
Refilled gabapentin #720 with one refill

## 2013-03-08 NOTE — Telephone Encounter (Signed)
Pt requesting 90 day supply, #720, ok?

## 2013-03-25 ENCOUNTER — Telehealth: Payer: Self-pay | Admitting: Internal Medicine

## 2013-03-25 NOTE — Telephone Encounter (Signed)
Patient Information:  Caller Name: Gracielynn  Phone: [phone]  Patient: Ruth Roth, Ruth Roth  Gender: Female  DOB: 1928/11/07  Age: 80 Years  PCP: Einar Pheasant  Office Follow Up:  Does the office need to follow up with this patient?: No  Instructions For The Office: N/A   Symptoms  Reason For Call & Symptoms: Pt states she has headache, sore throat, cvough and nasal congestion with bloody discharge.  Reviewed Health History In EMR: Yes  Reviewed Medications In EMR: Yes  Reviewed Allergies In EMR: Yes  Reviewed Surgeries / Procedures: Yes  Date of Onset of Symptoms: 03/23/2013  Guideline(s) Used:  Sinus Pain and Congestion  Disposition Per Guideline:   See Today or Tomorrow in Office  Reason For Disposition Reached:   Using nasal washes and pain medicine > 24 hours and sinus pain (lower forehead, cheekbone, or eye) persists  Advice Given:  Hydration:  Drink plenty of liquids (6-8 glasses of water daily). If the air in your home is dry, use a cool mist humidifier  Call Back If:   You become worse.  Patient Will Follow Care Advice:  YES  Appointment Scheduled:  03/26/2013 11:30:00 Appointment Scheduled Provider:  Charolette Forward

## 2013-03-25 NOTE — Telephone Encounter (Signed)
Shirlean Mylar called pt and she is seeing me 01/24/14 at 11:00

## 2013-03-25 NOTE — Telephone Encounter (Signed)
Fwd FYI to Dr. Nicki Reaper

## 2013-03-26 ENCOUNTER — Encounter: Payer: Self-pay | Admitting: Internal Medicine

## 2013-03-26 ENCOUNTER — Telehealth: Payer: Self-pay | Admitting: Internal Medicine

## 2013-03-26 ENCOUNTER — Other Ambulatory Visit: Payer: Self-pay | Admitting: Internal Medicine

## 2013-03-26 ENCOUNTER — Ambulatory Visit (INDEPENDENT_AMBULATORY_CARE_PROVIDER_SITE_OTHER): Payer: Medicare Other | Admitting: Internal Medicine

## 2013-03-26 ENCOUNTER — Ambulatory Visit: Payer: Self-pay | Admitting: Adult Health

## 2013-03-26 VITALS — BP 110/60 | HR 66 | Temp 98.3°F | Ht 63.0 in | Wt 144.5 lb

## 2013-03-26 DIAGNOSIS — J329 Chronic sinusitis, unspecified: Secondary | ICD-10-CM | POA: Diagnosis not present

## 2013-03-26 DIAGNOSIS — I10 Essential (primary) hypertension: Secondary | ICD-10-CM

## 2013-03-26 MED ORDER — BENZONATATE 100 MG PO CAPS: 100.0000 mg | ORAL_CAPSULE | Freq: Three times a day (TID) | ORAL | Status: DC | PRN

## 2013-03-26 MED ORDER — TRAMADOL HCL 50 MG PO TABS: 50.0000 mg | ORAL_TABLET | Freq: Two times a day (BID) | ORAL | Status: DC | PRN

## 2013-03-26 MED ORDER — FLUTICASONE PROPIONATE 50 MCG/ACT NA SUSP: 2.0000 | Freq: Every day | NASAL | Status: DC

## 2013-03-26 MED ORDER — AMOXICILLIN 875 MG PO TABS: 875.0000 mg | ORAL_TABLET | Freq: Two times a day (BID) | ORAL | Status: DC

## 2013-03-26 NOTE — Telephone Encounter (Signed)
traMADol (ULTRAM) 50 MG tablet ° °

## 2013-03-26 NOTE — Progress Notes (Signed)
Pre-visit discussion using our clinic review tool. No additional management support is needed unless otherwise documented below in the visit note.  

## 2013-03-26 NOTE — Patient Instructions (Signed)
Saline nasal spray - flush nose at least 2-3x/day.  Flonase nasal spray - 2 sprays each nostril one time per day (do in the evening).  Mucinex in the am and Robitussin in the evening.  Take antibiotics as prescribed.

## 2013-03-27 NOTE — Telephone Encounter (Signed)
Rx was printed and faxed on 03/26/12 to [facility]

## 2013-03-28 ENCOUNTER — Encounter: Payer: Self-pay | Admitting: Internal Medicine

## 2013-03-28 NOTE — Progress Notes (Signed)
Subjective:    [name], female    DOB: 21-May-1928, 80 y.o.   MRN: [id number]  Sore Throat  Associated symptoms include headaches.  Headache   80 year old female with past history of hypertension, hypercholesterolemia, FCD, hypothyroidism and autoimmune hepatitis who comes in today as a work in with concerns regarding increased sinus pressure and drainage.  States symptoms started over this past week.  Having a persistent sore throat.  Increased sinus pressure.  No fever.  Described colored mucus and some blood tinged mucus.  Ears stopped up.  Increased drainage.  No chest congestion.  Some cough.  No vomiting.  No diarrhea.     Past Medical History  Diagnosis Date  . Hypertension   . Hypercholesterolemia   . 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 Fibrocystic breast disease   . Osteoarthritis   . Inflammatory arthritis   . Autoimmune hepatitis     followed by Dr [name]  . Neuropathy   . History of colon polyps 2000    Current Outpatient Prescriptions on File Prior to Visit  Medication Sig Dispense Refill  . amitriptyline (ELAVIL) 10 MG tablet TAKE 2 TABLETS BY MOUTH EVERY NIGHT AT BEDTIME  60 tablet  5  . amLODipine (NORVASC) 10 MG tablet TAKE 1 TABLET BY MOUTH EVERY DAY  90 tablet  1  . aspirin 81 MG tablet Take 81 mg by mouth daily.        Marland Kitchen gabapentin (NEURONTIN) 300 MG capsule TAKE 2 CAPSULES BY MOUTH FOUR TIMES DAILY  720 capsule  1  . levothyroxine (SYNTHROID, LEVOTHROID) 112 MCG tablet Take 1 tablet (112 mcg total) by mouth daily. 1 tablet  30 tablet  11  . metoprolol (LOPRESSOR) 50 MG tablet TAKE 1 TABLET BY MOUTH TWICE DAILY  120 tablet  2  . omeprazole (PRILOSEC) 20 MG capsule TAKE 1 CAPSULE BY MOUTH TWICE DAILY  60 capsule  5  . Probiotic Product (ALIGN) 4 MG CAPS Take by mouth daily.      . ursodiol (ACTIGALL) 300 MG capsule Take 300 mg by mouth. 2 morning - 1 night        No current  facility-administered medications on file prior to visit.    Review of Systems  Neurological: Positive for headaches.  Patient denies any lightheadedness or dizziness.  Increased sinus pressure.  Increased colored mucus production.  Sore throat.  No chest discomfort.   No palpitations.  Increased drainage.  Increased cough.  Breathing appears to be stable.  No sob.   No acid reflux or problems swallowing.  No nausea or vomiting.   On omeprazole bid and ranitidine at night.  No urine change.         Objective:   Physical Exam  Filed Vitals:   03/26/13 1110  BP: 110/60  Pulse: 66  Temp: 98.3 F (50.64 C)   80 year old female in no acute distress.   HEENT:  Nares- erythematous turbinates.  Oropharynx - without lesions.  TMs visualized without erythema.  Minimal tenderness to palpation over the sinuses.   NECK:  Supple.  Nontender.  No audible bruit.  HEART:  Appears to be regular. LUNGS:  No crackles or wheezing audible.  Respirations even and unlabored.  RADIAL PULSE:  Equal bilaterally. ABDOMEN:  Soft, nontender.  Bowel sounds present and normal.  No audible abdominal bruit.   EXTREMITIES:  Lower extremity edema.  Improved.  No increased erythema or warmth.            Assessment & Plan:  CARDIOVASCULAR.  Had stress test.  Negative.  Currently asymptomatic.     MSK.  Sees Dr Jefm Bryant.  Stable.   HEALTH MAINTENANCE.  Physical 05/01/12. .  She is s/p hysterectomy.  Mammogram 03/24/12 - BiRADS II.  Scheduled a f/u mammogram.

## 2013-03-28 NOTE — Assessment & Plan Note (Signed)
Blood pressure under reasonable control.  Same meds.   Follow.  

## 2013-03-28 NOTE — Assessment & Plan Note (Signed)
Treat with amoxicillin.   Saline nasal flushes and flonase as directed.  Robitussin as directed.  Follow.  Some increased cough.  Tessalon Perles as directed.  Follow.

## 2013-03-29 ENCOUNTER — Other Ambulatory Visit: Payer: Self-pay | Admitting: Internal Medicine

## 2013-04-01 ENCOUNTER — Ambulatory Visit: Payer: Self-pay | Admitting: Internal Medicine

## 2013-04-01 DIAGNOSIS — Z1231 Encounter for screening mammogram for malignant neoplasm of breast: Secondary | ICD-10-CM | POA: Diagnosis not present

## 2013-04-01 LAB — HM MAMMOGRAPHY: HM Mammogram: NEGATIVE

## 2013-04-02 ENCOUNTER — Encounter: Payer: Self-pay | Admitting: Internal Medicine

## 2013-04-15 ENCOUNTER — Encounter: Payer: Self-pay | Admitting: Adult Health

## 2013-04-15 ENCOUNTER — Ambulatory Visit (INDEPENDENT_AMBULATORY_CARE_PROVIDER_SITE_OTHER): Payer: Medicare Other | Admitting: Adult Health

## 2013-04-15 VITALS — BP 110/56 | HR 75 | Temp 98.2°F | Resp 12 | Wt 142.0 lb

## 2013-04-15 DIAGNOSIS — R519 Headache, unspecified: Secondary | ICD-10-CM

## 2013-04-15 DIAGNOSIS — J3489 Other specified disorders of nose and nasal sinuses: Secondary | ICD-10-CM

## 2013-04-15 DIAGNOSIS — R51 Headache: Secondary | ICD-10-CM

## 2013-04-15 LAB — CBC WITH DIFFERENTIAL/PLATELET
Basophils Absolute: 0 10*3/uL (ref 0.0–0.1)
Basophils Relative: 0.3 % (ref 0.0–3.0)
EOS PCT: 2.5 % (ref 0.0–5.0)
Eosinophils Absolute: 0.2 10*3/uL (ref 0.0–0.7)
HCT: 38.8 % (ref 36.0–46.0)
Hemoglobin: 12.7 g/dL (ref 12.0–15.0)
Lymphocytes Relative: 29.9 % (ref 12.0–46.0)
Lymphs Abs: 2.4 10*3/uL (ref 0.7–4.0)
MCHC: 32.8 g/dL (ref 30.0–36.0)
MCV: 100.2 fl — AB (ref 78.0–100.0)
Monocytes Absolute: 0.6 10*3/uL (ref 0.1–1.0)
Monocytes Relative: 8.1 % (ref 3.0–12.0)
Neutro Abs: 4.7 10*3/uL (ref 1.4–7.7)
Neutrophils Relative %: 59.2 % (ref 43.0–77.0)
PLATELETS: 246 10*3/uL (ref 150.0–400.0)
RBC: 3.88 Mil/uL (ref 3.87–5.11)
RDW: 13.8 % (ref 11.5–14.6)
WBC: 8 10*3/uL (ref 4.5–10.5)

## 2013-04-15 LAB — SEDIMENTATION RATE: Sed Rate: 29 mm/hr — ABNORMAL HIGH (ref 0–22)

## 2013-04-15 LAB — C-REACTIVE PROTEIN: CRP: <0.5 mg/dL (ref 0.5–20.0)

## 2013-04-15 NOTE — Progress Notes (Signed)
Pre visit review using our clinic review tool, if applicable. No additional management support is needed unless otherwise documented below in the visit note. 

## 2013-04-15 NOTE — Progress Notes (Signed)
[name], female   DOB: 05-16-1928, 80 y.o.   MRN: [id number]    Subjective:    [name], female    DOB: 02-07-1929, 80 y.o.   MRN: [id number]  HPI  Pt is a pleasant 80 y/o female who presents to clinic with HA since Sunday. She reports "I have never had a HA like this before". She was nauseated last night. She has taken aleve and tramadol without improvement. She reports that each day her HA has gotten worse. She reports having HA in the past with sinus problems but it has never been this bad. She is experiencing some pain behind the right eye, her cheekbones are sore on the right side as well as her forehead. She has drainage but reports it is clear. She completed 10 days of antibiotic for a sinus infection in January.    Past Medical History  Diagnosis Date  . Hypertension   . Hypercholesterolemia   . 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 Fibrocystic breast disease   . Osteoarthritis   . Inflammatory arthritis   . Autoimmune hepatitis     followed by Dr [name]  . Neuropathy   . History of colon polyps 2000    Current Outpatient Prescriptions on File Prior to Visit  Medication Sig Dispense Refill  . amitriptyline (ELAVIL) 10 MG tablet TAKE 2 TABLETS BY MOUTH EVERY NIGHT AT BEDTIME  60 tablet  5  . amLODipine (NORVASC) 10 MG tablet TAKE 1 TABLET BY MOUTH EVERY DAY  90 tablet  1  . aspirin 81 MG tablet Take 81 mg by mouth daily.        . benzonatate (TESSALON) 100 MG capsule Take 1 capsule (100 mg total) by mouth 3 (three) times daily as needed for cough.  21 capsule  0  . fluticasone (FLONASE) 50 MCG/ACT nasal spray INSTILL 2 SPRAYS IN EACH NOSTRIL DAILY  48 g  0  . gabapentin (NEURONTIN) 300 MG capsule TAKE 2 CAPSULES BY MOUTH FOUR TIMES DAILY  720 capsule  1  . levothyroxine (SYNTHROID, LEVOTHROID) 112 MCG tablet Take 1 tablet (112 mcg total) by mouth daily. 1 tablet  30 tablet  11  .  metoprolol (LOPRESSOR) 50 MG tablet TAKE 1 TABLET BY MOUTH TWICE DAILY  120 tablet  2  . omeprazole (PRILOSEC) 20 MG capsule TAKE 1 CAPSULE BY MOUTH TWICE DAILY  60 capsule  5  . Probiotic Product (ALIGN) 4 MG CAPS Take by mouth daily.      . traMADol (ULTRAM) 50 MG tablet Take 1 tablet (50 mg total) by mouth 2 (two) times daily as needed.  30 tablet  0  . ursodiol (ACTIGALL) 300 MG capsule Take 300 mg by mouth. 2 morning - 1 night        No current facility-administered medications on file prior to visit.     Review of Systems  Constitutional: Negative for fever and chills.  Eyes:       Watery eye on the right  Respiratory: Negative.   Gastrointestinal: Positive for nausea.  Neurological: Positive for headaches.  Psychiatric/Behavioral: Negative.   All other systems reviewed and are negative.       Objective:  BP 110/56  Pulse 75  Temp(Src) 98.2 F (36.8 C) (Oral)  Resp 12  Wt 142 lb (64.411 kg)  SpO2 97%   Physical Exam  Constitutional: She is oriented to person, place, and  time. She appears well-developed and well-nourished.  Appears uncomfortable  HENT:  Head: Normocephalic and atraumatic.  Right Ear: External ear normal.  Left Ear: External ear normal.  Tenderness with palpation of right temporal area  Eyes: Conjunctivae and EOM are normal. Pupils are equal, round, and reactive to light. Right eye exhibits discharge. Left eye exhibits no discharge. No scleral icterus.  Swelling of right eye  Cardiovascular: Normal rate and regular rhythm.   Pulmonary/Chest: Effort normal. No respiratory distress.  Musculoskeletal: Normal range of motion.  Neurological: She is alert and oriented to person, place, and time. No cranial nerve deficit. Coordination normal.  Skin: Skin is warm and dry.  Psychiatric: She has a normal mood and affect. Her behavior is normal. Judgment and thought content normal.       Assessment & Plan:    1. Headache(784.0) She reports worsening HA  since Sunday. OTC meds not improving symptoms. CT of head and sinuses ordered. She had the option of going today but she preferred to have this done tomorrow. ?sinus related HA vs temporal arteritis vs vascular  2. Temporal pain Her pain is localized on the right temporal area, behind her right eye. Check labs.  - CBC with Differential - C-reactive protein - Sedimentation rate  3. Sinus pressure Recent sinus infection s/p treatment x 10 days. She reports having pressure but no other symptoms. Drainage is clear. Send for CT of head and sinuses. May need another round of antibiotics. I wanted her to go today for the scan but she chose appt for tomorrow. - CBC with Differential

## 2013-04-16 ENCOUNTER — Ambulatory Visit: Payer: Self-pay | Admitting: Adult Health

## 2013-04-16 DIAGNOSIS — M316 Other giant cell arteritis: Secondary | ICD-10-CM | POA: Diagnosis not present

## 2013-04-16 DIAGNOSIS — R51 Headache: Secondary | ICD-10-CM | POA: Diagnosis not present

## 2013-04-19 ENCOUNTER — Telehealth: Payer: Self-pay | Admitting: Internal Medicine

## 2013-04-19 ENCOUNTER — Ambulatory Visit (INDEPENDENT_AMBULATORY_CARE_PROVIDER_SITE_OTHER): Payer: Medicare Other | Admitting: Surgery

## 2013-04-19 ENCOUNTER — Other Ambulatory Visit (INDEPENDENT_AMBULATORY_CARE_PROVIDER_SITE_OTHER): Payer: Self-pay

## 2013-04-19 ENCOUNTER — Telehealth: Payer: Self-pay | Admitting: *Deleted

## 2013-04-19 ENCOUNTER — Encounter (INDEPENDENT_AMBULATORY_CARE_PROVIDER_SITE_OTHER): Payer: Self-pay | Admitting: Surgery

## 2013-04-19 VITALS — BP 138/68 | HR 64 | Temp 98.2°F | Resp 14 | Ht 63.0 in | Wt 146.0 lb

## 2013-04-19 DIAGNOSIS — E063 Autoimmune thyroiditis: Secondary | ICD-10-CM

## 2013-04-19 DIAGNOSIS — E039 Hypothyroidism, unspecified: Secondary | ICD-10-CM

## 2013-04-19 DIAGNOSIS — B029 Zoster without complications: Secondary | ICD-10-CM | POA: Diagnosis not present

## 2013-04-19 MED ORDER — VALACYCLOVIR HCL 1 G PO TABS: 1000.0000 mg | ORAL_TABLET | Freq: Three times a day (TID) | ORAL | Status: AC

## 2013-04-19 NOTE — Telephone Encounter (Signed)
Order placed for opthalmology referral 

## 2013-04-19 NOTE — Telephone Encounter (Signed)
Pt okay with referral-she goes to [facility]

## 2013-04-19 NOTE — Progress Notes (Signed)
General Surgery [facility]  Chief Complaint  Patient presents with  . Follow-up    lymphocytic thyroiditis, hypothyroidism    HISTORY: Patient is an 80 year old female followed for lymphocytic thyroiditis and hypothyroidism. She was last evaluated in my practice 1 year ago. At that time her TSH level was within the therapeutic range. An ultrasound was not performed.  Over the past year the patient was switched to generic levothyroxin. She denies any side effects from this change.  PERTINENT REVIEW OF SYSTEMS: Denies new masses. Denies tremor. Denies palpitations. Denies compressive symptoms. Notes onset of shingles right forehead, right scalp, and right upper eyelid.  EXAM: HEENT: normocephalic; pupils equal and reactive; sclerae clear; dentition good; mucous membranes moist; eruption on the right forehead extending into the right scalp with minimal raised lesions on her right upper eyelid NECK:  Thyroid gland is firm, right lobe greater incised in the left, without discrete or dominant nodule; symmetric on extension; no palpable anterior or posterior cervical lymphadenopathy; no supraclavicular masses; no tenderness CHEST: clear to auscultation bilaterally without rales, rhonchi, or wheezes CARDIAC: regular rate and rhythm without significant murmur; peripheral pulses are full EXT:  non-tender without edema; no deformity NEURO: no gross focal deficits; no sign of tremor   IMPRESSION: #1 lymphocytic thyroiditis, asymmetric thyroid gland on physical exam #2 hypothyroidism #3 shingles involving right 4 head and right upper eyelid  PLAN: Patient and I discussed the above findings at length. I would like to obtain a TSH level and a thyroid ultrasound at this time. We will contact her with the results of those studies. For now she will remain on her current dose of levothyroxine. She will continue to take generic medications.  Patient had contacted the office of Dr.  Einar Pheasant earlier today. I will go ahead and start her on Valtrex for her outbreak of shingles. She will followup with Dr. Nicki Reaper.  We will contact the patient with the results of her thyroid studies. If all is well, she will return to see me in one year.  Earnstine Regal, MD, [facility] Office: [phone]  Visit Diagnoses: 1. Thyroiditis, lymphocytic   2. Hypothyroidism   3. Shingles, right forehead

## 2013-04-19 NOTE — Telephone Encounter (Signed)
Pt saw Dr Harlow Asa today and was diagnosed with shingles.  Given that the rash involved the right eyelid, etc - need to refer her to opthalmology for further evaluation and to confirm no eye involvement.  Let me know and I will place the order for the referral.

## 2013-04-19 NOTE — Patient Instructions (Signed)
Shingles Shingles (herpes zoster) is an infection that is caused by the same virus that causes chickenpox (varicella). The infection causes a painful skin rash and fluid-filled blisters, which eventually break open, crust over, and heal. It may occur in any area of the body, but it usually affects only one side of the body or face. The pain of shingles usually lasts about 1 month. However, some people with shingles may develop long-term (chronic) pain in the affected area of the body. Shingles often occurs many years after the person had chickenpox. It is more common:  In people older than 50 years.  In people with weakened immune systems, such as those with HIV, AIDS, or cancer.  In people taking medicines that weaken the immune system, such as transplant medicines.  In people under great stress. CAUSES  Shingles is caused by the varicella zoster virus (VZV), which also causes chickenpox. After a person is infected with the virus, it can remain in the person's body for years in an inactive state (dormant). To cause shingles, the virus reactivates and breaks out as an infection in a nerve root. The virus can be spread from person to person (contagious) through contact with open blisters of the shingles rash. It will only spread to people who have not had chickenpox. When these people are exposed to the virus, they may develop chickenpox. They will not develop shingles. Once the blisters scab over, the person is no longer contagious and cannot spread the virus to others. SYMPTOMS  Shingles shows up in stages. The initial symptoms may be pain, itching, and tingling in an area of the skin. This pain is usually described as burning, stabbing, or throbbing.In a few days or weeks, a painful red rash will appear in the area where the pain, itching, and tingling were felt. The rash is usually on one side of the body in a band or belt-like pattern. Then, the rash usually turns into fluid-filled blisters. They  will scab over and dry up in approximately 2 3 weeks. Flu-like symptoms may also occur with the initial symptoms, the rash, or the blisters. These may include:  Fever.  Chills.  Headache.  Upset stomach. DIAGNOSIS  Your caregiver will perform a skin exam to diagnose shingles. Skin scrapings or fluid samples may also be taken from the blisters. This sample will be examined under a microscope or sent to a lab for further testing. TREATMENT  There is no specific cure for shingles. Your caregiver will likely prescribe medicines to help you manage the pain, recover faster, and avoid long-term problems. This may include antiviral drugs, anti-inflammatory drugs, and pain medicines. HOME CARE INSTRUCTIONS   Take a cool bath or apply cool compresses to the area of the rash or blisters as directed. This may help with the pain and itching.   Only take over-the-counter or prescription medicines as directed by your caregiver.   Rest as directed by your caregiver.  Keep your rash and blisters clean with mild soap and cool water or as directed by your caregiver.  Do not pick your blisters or scratch your rash. Apply an anti-itch cream or numbing creams to the affected area as directed by your caregiver.  Keep your shingles rash covered with a loose bandage (dressing).  Avoid skin contact with:  Babies.   Pregnant women.   Children with eczema.   Elderly people with transplants.   People with chronic illnesses, such as leukemia or AIDS.   Wear loose-fitting clothing to help ease   the pain of material rubbing against the rash.  Keep all follow-up appointments with your caregiver.If the area involved is on your face, you may receive a referral for follow-up to a specialist, such as an eye doctor (ophthalmologist) or an ear, nose, and throat (ENT) doctor. Keeping all follow-up appointments will help you avoid eye complications, chronic pain, or disability.  SEEK IMMEDIATE MEDICAL  CARE IF:   You have facial pain, pain around the eye area, or loss of feeling on one side of your face.  You have ear pain or ringing in your ear.  You have loss of taste.  Your pain is not relieved with prescribed medicines.   Your redness or swelling spreads.   You have more pain and swelling.  Your condition is worsening or has changed.   You have a feveror persistent symptoms for more than 2 3 days.  You have a fever and your symptoms suddenly get worse. MAKE SURE YOU:  Understand these instructions.  Will watch your condition.  Will get help right away if you are not doing well or get worse. Document Released: 02/25/2005 Document Revised: 11/20/2011 Document Reviewed: 10/10/2011 Exit[employer] Patient Information 2014 [employer].  

## 2013-04-19 NOTE — Telephone Encounter (Signed)
Spoke with pt regarding CT results "Normal findings. No sinusitis, no abnormality noted". Pt states she has broken out with shingles in the are of her head pain. Has an appointment today with her endocrinologist, will ask him if he will treat her. Otherwise, appointment scheduled for tomorrow with Raquel for treatment.

## 2013-04-20 ENCOUNTER — Ambulatory Visit: Payer: Medicare Other | Admitting: Adult Health

## 2013-04-20 DIAGNOSIS — B0232 Zoster iridocyclitis: Secondary | ICD-10-CM | POA: Diagnosis not present

## 2013-04-21 ENCOUNTER — Telehealth: Payer: Self-pay | Admitting: *Deleted

## 2013-04-21 NOTE — Telephone Encounter (Signed)
Pt left VM, requesting something to be called in for her shingles pain to [facility]. Is out of her Tramadol.

## 2013-04-21 NOTE — Telephone Encounter (Signed)
She is already on gabapentin four times a day.  This should help.  If the tramadol helped, I can refill this medication.

## 2013-04-22 ENCOUNTER — Ambulatory Visit
Admission: RE | Admit: 2013-04-22 | Discharge: 2013-04-22 | Disposition: A | Discharge status: Home / Self Care | Payer: Medicare Other | Source: Ambulatory Visit | Attending: Surgery | Admitting: Surgery

## 2013-04-22 DIAGNOSIS — E039 Hypothyroidism, unspecified: Secondary | ICD-10-CM | POA: Diagnosis not present

## 2013-04-22 DIAGNOSIS — E063 Autoimmune thyroiditis: Secondary | ICD-10-CM

## 2013-04-22 DIAGNOSIS — E049 Nontoxic goiter, unspecified: Secondary | ICD-10-CM | POA: Diagnosis not present

## 2013-04-22 NOTE — Telephone Encounter (Signed)
Left detailed voicemail for pt to call & let me know if she wants the Tramadol sent in.

## 2013-04-23 ENCOUNTER — Telehealth (INDEPENDENT_AMBULATORY_CARE_PROVIDER_SITE_OTHER): Payer: Self-pay

## 2013-04-23 LAB — TSH: TSH: 1.874 u[IU]/mL (ref 0.350–4.500)

## 2013-04-23 NOTE — Telephone Encounter (Signed)
LMOM for pt to call. Pt needs to be advised of attached msg from Dr Harlow Asa. Levels are fine and no need to change her dose.

## 2013-04-23 NOTE — Telephone Encounter (Signed)
Message copied by Dois Davenport on Fri Apr 23, 2013  8:29 AM ------      Message from: Earnstine Regal      Created: Fri Apr 23, 2013  7:48 AM       Ruth Roth,            Let her know that her level is fine and no need to change her dose.            tmg            Earnstine Regal, MD, [facility]      Office: [phone]                  ----- Message -----         From: [facility]         Sent: 04/23/2013   2:50 AM           To: Earnstine Regal, MD                   ------

## 2013-04-23 NOTE — Telephone Encounter (Signed)
Patient return call to office.  Patient made aware that Levels are fine and no need to change medication dose.  Patient verbalized understanding.

## 2013-04-29 ENCOUNTER — Telehealth (INDEPENDENT_AMBULATORY_CARE_PROVIDER_SITE_OTHER): Payer: Self-pay | Admitting: General Surgery

## 2013-04-29 NOTE — Telephone Encounter (Signed)
LMOM to let daughter know that her mother level is fine and no need to change her dose, per Dr Harlow Asa

## 2013-04-29 NOTE — Telephone Encounter (Signed)
Patient called back and I told her about her level for her TSH and she and no need to change her dose, the patient was asking about her U/S and I told her that Dr Harlow Asa did not say anything about her U/S, but I will send a phone message to him about the U/S

## 2013-04-30 NOTE — Telephone Encounter (Signed)
Pt never returned my call. Has appt on 05/06/13 for physical

## 2013-04-30 NOTE — Telephone Encounter (Signed)
Received this message:  Maryclare Bean, MA at 04/29/2013 9:17 AM     Status: Signed        Patient called back and I told her about her level for her TSH and she and no need to change her dose, the patient was asking about her U/S and I told her that Dr Harlow Asa did not say anything about her U/S, but I will send a phone message to him about the U/S     I reviewed ultrasound.  Mildly enlarged thyroid gland with no significant nodules. No lymphadenopathy.  Jenny Reichmann - please inform patient.  Earnstine Regal, MD, [facility] Office: [phone]

## 2013-05-03 ENCOUNTER — Other Ambulatory Visit: Payer: Self-pay | Admitting: Internal Medicine

## 2013-05-05 NOTE — Telephone Encounter (Signed)
Pt notified per Dr Gala Lewandowsky request that thyroid gland mildly enlarged with not significant nodules and to lymphadenopathy. Pt will keep 1 year f/u ultrasound and exam with Dr Harlow Asa.

## 2013-05-06 ENCOUNTER — Encounter: Payer: Medicare Other | Admitting: Internal Medicine

## 2013-05-10 ENCOUNTER — Telehealth: Payer: Self-pay | Admitting: Internal Medicine

## 2013-05-10 NOTE — Telephone Encounter (Signed)
The patient is wanting some medication for pain from her shingles.

## 2013-05-10 NOTE — Telephone Encounter (Signed)
Please advise-see note in your folder also

## 2013-05-10 NOTE — Telephone Encounter (Signed)
If increased pain not responding to gabapentin - will need to be evaluated - so best know how to treat.

## 2013-05-11 NOTE — Telephone Encounter (Signed)
See if she can come in at 8:30 tomorrow am.  Block 30 min

## 2013-05-11 NOTE — Telephone Encounter (Signed)
Spoke with pt, appt tomorrow at 8:30 with Dr. Nicki Reaper

## 2013-05-11 NOTE — Telephone Encounter (Signed)
Spoke with pt, verbalized understanding for need for appointment. Declined appointment with Raquel this week. Where on your schedule should I put her?

## 2013-05-12 ENCOUNTER — Encounter (INDEPENDENT_AMBULATORY_CARE_PROVIDER_SITE_OTHER): Payer: Self-pay

## 2013-05-12 ENCOUNTER — Encounter: Payer: Self-pay | Admitting: Internal Medicine

## 2013-05-12 ENCOUNTER — Ambulatory Visit (INDEPENDENT_AMBULATORY_CARE_PROVIDER_SITE_OTHER): Payer: Medicare Other | Admitting: Internal Medicine

## 2013-05-12 VITALS — BP 110/70 | HR 64 | Temp 98.3°F | Resp 18 | Ht 63.0 in | Wt 146.0 lb

## 2013-05-12 DIAGNOSIS — K754 Autoimmune hepatitis: Secondary | ICD-10-CM

## 2013-05-12 DIAGNOSIS — B0232 Zoster iridocyclitis: Secondary | ICD-10-CM | POA: Diagnosis not present

## 2013-05-12 DIAGNOSIS — B029 Zoster without complications: Secondary | ICD-10-CM | POA: Diagnosis not present

## 2013-05-12 MED ORDER — TRAMADOL HCL 50 MG PO TABS: ORAL_TABLET | ORAL | Status: DC

## 2013-05-12 NOTE — Assessment & Plan Note (Signed)
Diagnosed with shingles.  Saw opthalmology.  No eye involvement.  Due to f/u with Dr Tobe Sos today.  Persistent pain.  Will prescribe tramadol.  Instructed on possible side effects.  She has taken previously and tolerated well.  Will taper amitriptyline.  Follow closely.  She will let me know if any problems.

## 2013-05-12 NOTE — Progress Notes (Signed)
  Subjective:    [name], female    DOB: December 19, 1928, 80 y.o.   MRN: [id number]  HPI 81 year old female with past history of hypertension, hypercholesterolemia, FCD, hypothyroidism and autoimmune hepatitis who comes in today as a work in with concerns regarding persistent pain from shingles.  Was recently evaluated and diagnosed with shingles.  Involved the right side of her head.  Saw opthalmology.  No eye involvement.  Due to f/u with Dr Tobe Sos today.  On gabapentin.  Persistent pain.  Feels needs something stronger.  Able to tolerate tramadol.     Past Medical History  Diagnosis Date  . Hypertension   . Hypercholesterolemia   . 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 Fibrocystic breast disease   . Osteoarthritis   . Inflammatory arthritis   . Autoimmune hepatitis     followed by Dr [name]  . Neuropathy   . History of colon polyps 2000    Current Outpatient Prescriptions on File Prior to Visit  Medication Sig Dispense Refill  . amitriptyline (ELAVIL) 10 MG tablet TAKE 2 TABLETS BY MOUTH EVERY NIGHT AT BEDTIME  60 tablet  5  . amLODipine (NORVASC) 10 MG tablet TAKE 1 TABLET BY MOUTH EVERY DAY  90 tablet  1  . aspirin 81 MG tablet Take 81 mg by mouth daily.        . benzonatate (TESSALON) 100 MG capsule Take 1 capsule (100 mg total) by mouth 3 (three) times daily as needed for cough.  21 capsule  0  . fluticasone (FLONASE) 50 MCG/ACT nasal spray INSTILL 2 SPRAYS IN EACH NOSTRIL DAILY  48 g  0  . gabapentin (NEURONTIN) 300 MG capsule TAKE 2 CAPSULES BY MOUTH FOUR TIMES DAILY  720 capsule  1  . levothyroxine (SYNTHROID, LEVOTHROID) 112 MCG tablet Take 1 tablet (112 mcg total) by mouth daily. 1 tablet  30 tablet  11  . metoprolol (LOPRESSOR) 50 MG tablet TAKE 1 TABLET BY MOUTH TWICE DAILY  120 tablet  2  . omeprazole (PRILOSEC) 20 MG capsule TAKE 1 CAPSULE BY MOUTH TWICE DAILY  60 capsule  5  . Probiotic Product  (ALIGN) 4 MG CAPS Take by mouth daily.      . ursodiol (ACTIGALL) 300 MG capsule Take 300 mg by mouth. 2 morning - 1 night        No current facility-administered medications on file prior to visit.    Review of Systems Patient denies any lightheadedness or dizziness.  Head pain as outlined.  Rash - gone.  Due to see Dr Tobe Sos today.  No significant sinus symptoms.  Eating and drinking well.  No nausea or vomiting.        Objective:   Physical Exam  Filed Vitals:   05/12/13 0804  BP: 110/70  Pulse: 64  Temp: 98.3 F (36.8 C)  Resp: 86   80 year old female in no acute distress.   HEENT:  Oropharynx - without lesions.  TMs visualized without erythema.  No rash.  NECK:  Supple.  Nontender.   HEART:  Appears to be regular. LUNGS:  No crackles or wheezing audible.  Respirations even and unlabored.           Assessment & Plan:  HEALTH MAINTENANCE.  Physical 05/01/12. .  She is s/p hysterectomy.  Mammogram 03/24/12 - BiRADS II.  Schedule a f/u mammogram.

## 2013-05-12 NOTE — Assessment & Plan Note (Signed)
Followed by Dr [name].  previous ultrasound she reports revealed a dilated duct.  Had MRCP.   Doing better.  Abdominal ultrasound revealed no acute change.  Saw Dr Bary Castilla.  Currently asymptomatic.  Follow.  Follow liver function.

## 2013-05-12 NOTE — Progress Notes (Signed)
Pre-visit discussion using our clinic review tool. No additional management support is needed unless otherwise documented below in the visit note.  

## 2013-05-18 ENCOUNTER — Encounter: Payer: Self-pay | Admitting: Adult Health

## 2013-06-10 ENCOUNTER — Other Ambulatory Visit: Payer: Self-pay | Admitting: Internal Medicine

## 2013-06-26 DIAGNOSIS — N189 Chronic kidney disease, unspecified: Secondary | ICD-10-CM | POA: Insufficient documentation

## 2013-06-26 DIAGNOSIS — D649 Anemia, unspecified: Secondary | ICD-10-CM | POA: Insufficient documentation

## 2013-06-26 DIAGNOSIS — B351 Tinea unguium: Secondary | ICD-10-CM | POA: Insufficient documentation

## 2013-06-26 DIAGNOSIS — D631 Anemia in chronic kidney disease: Secondary | ICD-10-CM | POA: Insufficient documentation

## 2013-06-26 DIAGNOSIS — M898X9 Other specified disorders of bone, unspecified site: Secondary | ICD-10-CM | POA: Insufficient documentation

## 2013-07-08 DIAGNOSIS — I872 Venous insufficiency (chronic) (peripheral): Secondary | ICD-10-CM | POA: Diagnosis not present

## 2013-07-08 DIAGNOSIS — I890 Lymphedema, not elsewhere classified: Secondary | ICD-10-CM | POA: Diagnosis not present

## 2013-07-08 DIAGNOSIS — M7989 Other specified soft tissue disorders: Secondary | ICD-10-CM | POA: Diagnosis not present

## 2013-07-08 DIAGNOSIS — M79609 Pain in unspecified limb: Secondary | ICD-10-CM | POA: Diagnosis not present

## 2013-07-12 ENCOUNTER — Encounter: Payer: Medicare Other | Admitting: Internal Medicine

## 2013-07-12 ENCOUNTER — Telehealth: Payer: Self-pay | Admitting: Internal Medicine

## 2013-07-12 NOTE — Telephone Encounter (Signed)
Please advise 

## 2013-07-12 NOTE — Telephone Encounter (Signed)
Please hold or make list for cancellation.  Can fill in if someone cancels.  Thanks.

## 2013-07-12 NOTE — Telephone Encounter (Signed)
Pt called to rs cpe due to sister passing away.  Next available 7/2.  Pt asking if Dr. Nicki Reaper could work her in sooner.

## 2013-07-12 NOTE — Telephone Encounter (Signed)
Put on wait list

## 2013-07-26 ENCOUNTER — Other Ambulatory Visit: Payer: Self-pay | Admitting: Internal Medicine

## 2013-08-11 DIAGNOSIS — K754 Autoimmune hepatitis: Secondary | ICD-10-CM | POA: Diagnosis not present

## 2013-08-11 DIAGNOSIS — K745 Biliary cirrhosis, unspecified: Secondary | ICD-10-CM | POA: Diagnosis not present

## 2013-08-18 DIAGNOSIS — H04209 Unspecified epiphora, unspecified lacrimal gland: Secondary | ICD-10-CM | POA: Diagnosis not present

## 2013-09-04 ENCOUNTER — Other Ambulatory Visit: Payer: Self-pay | Admitting: Internal Medicine

## 2013-09-06 ENCOUNTER — Other Ambulatory Visit: Payer: Self-pay | Admitting: Internal Medicine

## 2013-09-09 ENCOUNTER — Encounter: Payer: Self-pay | Admitting: Internal Medicine

## 2013-09-09 ENCOUNTER — Ambulatory Visit (INDEPENDENT_AMBULATORY_CARE_PROVIDER_SITE_OTHER): Payer: Medicare Other | Admitting: Internal Medicine

## 2013-09-09 VITALS — BP 120/70 | HR 65 | Temp 98.4°F | Ht 63.0 in | Wt 147.2 lb

## 2013-09-09 DIAGNOSIS — M949 Disorder of cartilage, unspecified: Secondary | ICD-10-CM

## 2013-09-09 DIAGNOSIS — K219 Gastro-esophageal reflux disease without esophagitis: Secondary | ICD-10-CM

## 2013-09-09 DIAGNOSIS — I10 Essential (primary) hypertension: Secondary | ICD-10-CM

## 2013-09-09 DIAGNOSIS — M899 Disorder of bone, unspecified: Secondary | ICD-10-CM

## 2013-09-09 DIAGNOSIS — B351 Tinea unguium: Secondary | ICD-10-CM

## 2013-09-09 DIAGNOSIS — K754 Autoimmune hepatitis: Secondary | ICD-10-CM | POA: Diagnosis not present

## 2013-09-09 DIAGNOSIS — B029 Zoster without complications: Secondary | ICD-10-CM | POA: Diagnosis not present

## 2013-09-09 DIAGNOSIS — E7800 Pure hypercholesterolemia, unspecified: Secondary | ICD-10-CM

## 2013-09-09 DIAGNOSIS — E063 Autoimmune thyroiditis: Secondary | ICD-10-CM

## 2013-09-09 DIAGNOSIS — E039 Hypothyroidism, unspecified: Secondary | ICD-10-CM

## 2013-09-09 DIAGNOSIS — M8580 Other specified disorders of bone density and structure, unspecified site: Secondary | ICD-10-CM

## 2013-09-09 MED ORDER — TRAMADOL HCL 50 MG PO TABS: ORAL_TABLET | ORAL | Status: DC

## 2013-09-09 NOTE — Progress Notes (Signed)
Pre visit review using our clinic review tool, if applicable. No additional management support is needed unless otherwise documented below in the visit note. 

## 2013-09-12 ENCOUNTER — Encounter: Payer: Self-pay | Admitting: Internal Medicine

## 2013-09-12 NOTE — Assessment & Plan Note (Signed)
Low cholesterol diet and exercise.  Follow lipid panel.   

## 2013-09-12 NOTE — Assessment & Plan Note (Signed)
Discussed treatment.  Avoid oral antifungals.  Discussed topical treatment.  Discuss treatment with Dr [name].

## 2013-09-12 NOTE — Assessment & Plan Note (Signed)
Seeing Dr Harlow Asa.  On Synthroid.  Stable.

## 2013-09-12 NOTE — Assessment & Plan Note (Signed)
Blood pressure under reasonable control.  Same meds.   Follow.  

## 2013-09-12 NOTE — Progress Notes (Signed)
Subjective:    [name], female    DOB: 1928/11/29, 80 y.o.   MRN: [id number]  HPI 80 year old female with past history of hypertension, hypercholesterolemia, FCD, hypothyroidism and autoimmune hepatitis who comes in today to follow up on these issues as well as for a complete physical exam.  Was recently evaluated and diagnosed with shingles.  Involved the right side of her head.  Saw opthalmology.  No eye involvement.  On gabapentin.  Ask for a refill on tramadol - just to have to take prn.  Does not take on a regular basis.  Just saw Dr [name].  Liver function wnl.  Overall she feels she is doing relatively well.  Some increased stress with the recent death of her sister.  Feels she is coping relatively well.  Does not feel she needs any further intervention.  Is concerned about a toe nail fungus.    Past Medical History  Diagnosis Date  . Hypertension   . Hypercholesterolemia   . 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 Fibrocystic breast disease   . Osteoarthritis   . Inflammatory arthritis   . Autoimmune hepatitis     followed by Dr [name]  . Neuropathy   . History of colon polyps 2000    Current Outpatient Prescriptions on File Prior to Visit  Medication Sig Dispense Refill  . amitriptyline (ELAVIL) 10 MG tablet TAKE 2 TABLETS BY MOUTH EVERY NIGHT AT BEDTIME  60 tablet  5  . amLODipine (NORVASC) 10 MG tablet TAKE 1 TABLET BY MOUTH EVERY DAY  90 tablet  1  . aspirin 81 MG tablet Take 81 mg by mouth daily.        Marland Kitchen gabapentin (NEURONTIN) 300 MG capsule TAKE 2 CAPSULES BY MOUTH FOUR TIMES DAILY  720 capsule  1  . levothyroxine (SYNTHROID, LEVOTHROID) 112 MCG tablet Take 1 tablet (112 mcg total) by mouth daily. 1 tablet  30 tablet  11  . metoprolol (LOPRESSOR) 50 MG tablet TAKE 1 TABLET BY MOUTH TWICE DAILY.  180 tablet  1  . omeprazole (PRILOSEC) 20 MG capsule TAKE 1 CAPSULE BY MOUTH TWICE DAILY  180 capsule   0  . Probiotic Product (ALIGN) 4 MG CAPS Take by mouth daily.      . ursodiol (ACTIGALL) 300 MG capsule Take 300 mg by mouth. 2 morning - 1 night       . fluticasone (FLONASE) 50 MCG/ACT nasal spray INSTILL 2 SPRAYS IN EACH NOSTRIL DAILY  48 g  0   No current facility-administered medications on file prior to visit.    Review of Systems Patient denies any lightheadedness or dizziness.  Still some head pain s/p shingles.  Rash - gone.  No significant sinus symptoms.  Eating and drinking well.  No nausea or vomiting.  No acid reflux.  No sob.  No chest pain or tightness.  bowels doing well.  Just saw Dr Donnella Sham.  Liver labs wnl.  Desires to have treatment for toe nail fungus.         Objective:   Physical Exam  Filed Vitals:   09/09/13 1320  BP: 120/70  Pulse: 65  Temp: 98.4 F (36.9 C)   Blood pressure recheck:  47/9  80 year old female in no acute distress.   HEENT:  Nares- clear.  Oropharynx - without lesions. NECK:  Supple.  Nontender.  No audible bruit.  HEART:  Appears to be regular. LUNGS:  No crackles or wheezing audible.  Respirations even and unlabored.  RADIAL PULSE:  Equal bilaterally.    BREASTS:  No nipple discharge or nipple retraction present.  Could not appreciate any distinct nodules or axillary adenopathy.  ABDOMEN:  Soft, nontender.  Bowel sounds present and normal.  No audible abdominal bruit.  GU:  Not performed.    EXTREMITIES:  No increased edema present.  DP pulses palpable and equal bilaterally.          Assessment & Plan:  HEALTH MAINTENANCE.  Physical today .  She is s/p hysterectomy.  Mammogram 04/01/13 - BiRADS I.

## 2013-09-12 NOTE — Assessment & Plan Note (Signed)
Followed by Dr [name].  Previous ultrasound she reports revealed a dilated duct.  Had MRCP.   Doing better.  Abdominal ultrasound revealed no acute change.  Saw Dr Bary Castilla.  Currently asymptomatic.  Follow.  Follow liver function.

## 2013-09-12 NOTE — Assessment & Plan Note (Signed)
Pain is better.  Still some persistent pain.  Overall improved.  Continue gabapentin.  Asked for refill on tramadol just to have prn.

## 2013-09-12 NOTE — Assessment & Plan Note (Signed)
On omeprazole bid and ranitidine at night.  Symptoms controlled.

## 2013-09-12 NOTE — Assessment & Plan Note (Signed)
Received Reclast.

## 2013-09-16 ENCOUNTER — Other Ambulatory Visit (INDEPENDENT_AMBULATORY_CARE_PROVIDER_SITE_OTHER): Payer: Medicare Other

## 2013-09-16 DIAGNOSIS — I10 Essential (primary) hypertension: Secondary | ICD-10-CM

## 2013-09-16 DIAGNOSIS — M949 Disorder of cartilage, unspecified: Secondary | ICD-10-CM | POA: Diagnosis not present

## 2013-09-16 DIAGNOSIS — K754 Autoimmune hepatitis: Secondary | ICD-10-CM

## 2013-09-16 DIAGNOSIS — M8580 Other specified disorders of bone density and structure, unspecified site: Secondary | ICD-10-CM

## 2013-09-16 DIAGNOSIS — M899 Disorder of bone, unspecified: Secondary | ICD-10-CM | POA: Diagnosis not present

## 2013-09-16 DIAGNOSIS — E7800 Pure hypercholesterolemia, unspecified: Secondary | ICD-10-CM

## 2013-09-16 DIAGNOSIS — E038 Other specified hypothyroidism: Secondary | ICD-10-CM

## 2013-09-16 LAB — LIPID PANEL
CHOLESTEROL: 189 mg/dL (ref 0–200)
HDL: 60 mg/dL (ref >39.00)
LDL Cholesterol: 107 mg/dL — ABNORMAL HIGH (ref 0–99)
NonHDL: 129
TRIGLYCERIDES: 108 mg/dL (ref 0.0–149.0)
Total CHOL/HDL Ratio: 3
VLDL: 21.6 mg/dL (ref 0.0–40.0)

## 2013-09-16 LAB — CBC WITH DIFFERENTIAL/PLATELET
BASOS ABS: 0 10*3/uL (ref 0.0–0.1)
Basophils Relative: 0.6 % (ref 0.0–3.0)
EOS ABS: 0.2 10*3/uL (ref 0.0–0.7)
Eosinophils Relative: 3.4 % (ref 0.0–5.0)
HCT: 34.7 % — ABNORMAL LOW (ref 36.0–46.0)
Hemoglobin: 11.6 g/dL — ABNORMAL LOW (ref 12.0–15.0)
Lymphocytes Relative: 40.9 % (ref 12.0–46.0)
Lymphs Abs: 2.5 10*3/uL (ref 0.7–4.0)
MCHC: 33.4 g/dL (ref 30.0–36.0)
MCV: 97.1 fl (ref 78.0–100.0)
Monocytes Absolute: 0.6 10*3/uL (ref 0.1–1.0)
Monocytes Relative: 9.5 % (ref 3.0–12.0)
NEUTROS PCT: 45.6 % (ref 43.0–77.0)
Neutro Abs: 2.7 10*3/uL (ref 1.4–7.7)
Platelets: 234 10*3/uL (ref 150.0–400.0)
RBC: 3.57 Mil/uL — ABNORMAL LOW (ref 3.87–5.11)
RDW: 13.6 % (ref 11.5–15.5)
WBC: 6 10*3/uL (ref 4.0–10.5)

## 2013-09-16 LAB — VITAMIN D 25 HYDROXY (VIT D DEFICIENCY, FRACTURES): VITD: 57.8 ng/mL

## 2013-09-16 LAB — HEPATIC FUNCTION PANEL
ALBUMIN: 3.9 g/dL (ref 3.5–5.2)
ALK PHOS: 65 U/L (ref 39–117)
ALT: 18 U/L (ref 0–35)
AST: 15 U/L (ref 0–37)
Bilirubin, Direct: 0 mg/dL (ref 0.0–0.3)
TOTAL PROTEIN: 6.6 g/dL (ref 6.0–8.3)
Total Bilirubin: 0.6 mg/dL (ref 0.2–1.2)

## 2013-09-16 LAB — BASIC METABOLIC PANEL
BUN: 24 mg/dL — ABNORMAL HIGH (ref 6–23)
CALCIUM: 8.8 mg/dL (ref 8.4–10.5)
CO2: 22 meq/L (ref 19–32)
CREATININE: 1 mg/dL (ref 0.4–1.2)
Chloride: 105 mEq/L (ref 96–112)
GFR: 57.56 mL/min — AB (ref >60.00)
Glucose, Bld: 90 mg/dL (ref 70–99)
Potassium: 4.1 mEq/L (ref 3.5–5.1)
Sodium: 138 mEq/L (ref 135–145)

## 2013-09-19 ENCOUNTER — Other Ambulatory Visit: Payer: Self-pay | Admitting: Internal Medicine

## 2013-09-19 DIAGNOSIS — G629 Polyneuropathy, unspecified: Secondary | ICD-10-CM

## 2013-09-19 DIAGNOSIS — D649 Anemia, unspecified: Secondary | ICD-10-CM

## 2013-09-19 NOTE — Progress Notes (Signed)
Order placed for f/u labs.  

## 2013-09-21 ENCOUNTER — Encounter: Payer: Self-pay | Admitting: *Deleted

## 2013-10-11 ENCOUNTER — Telehealth: Payer: Self-pay | Admitting: *Deleted

## 2013-10-11 DIAGNOSIS — I10 Essential (primary) hypertension: Secondary | ICD-10-CM

## 2013-10-11 DIAGNOSIS — M8580 Other specified disorders of bone density and structure, unspecified site: Secondary | ICD-10-CM

## 2013-10-11 NOTE — Telephone Encounter (Signed)
Juliann Pulse called states it is time for pts Reclast infusion.  Pt needs a bone density. Creatine, calcium, and Vitamin D appears to be up to date as drawn on September 16, 2013.  Please advise.

## 2013-10-11 NOTE — Telephone Encounter (Signed)
Notify pt that per rheumatology - she is due a f/u bone density prior to reclast infusion.  If she is agreeable, let me know and I will schedule.

## 2013-10-12 ENCOUNTER — Other Ambulatory Visit (INDEPENDENT_AMBULATORY_CARE_PROVIDER_SITE_OTHER): Payer: Medicare Other

## 2013-10-12 DIAGNOSIS — G609 Hereditary and idiopathic neuropathy, unspecified: Secondary | ICD-10-CM

## 2013-10-12 DIAGNOSIS — I10 Essential (primary) hypertension: Secondary | ICD-10-CM

## 2013-10-12 DIAGNOSIS — D649 Anemia, unspecified: Secondary | ICD-10-CM

## 2013-10-12 DIAGNOSIS — G629 Polyneuropathy, unspecified: Secondary | ICD-10-CM

## 2013-10-12 DIAGNOSIS — E7800 Pure hypercholesterolemia, unspecified: Secondary | ICD-10-CM | POA: Diagnosis not present

## 2013-10-12 DIAGNOSIS — K754 Autoimmune hepatitis: Secondary | ICD-10-CM | POA: Diagnosis not present

## 2013-10-12 LAB — CBC WITH DIFFERENTIAL/PLATELET
Basophils Absolute: 0 10*3/uL (ref 0.0–0.1)
Basophils Relative: 0.4 % (ref 0.0–3.0)
EOS PCT: 2.8 % (ref 0.0–5.0)
Eosinophils Absolute: 0.2 10*3/uL (ref 0.0–0.7)
HCT: 37.5 % (ref 36.0–46.0)
Hemoglobin: 12.4 g/dL (ref 12.0–15.0)
LYMPHS PCT: 41.2 % (ref 12.0–46.0)
Lymphs Abs: 2.6 10*3/uL (ref 0.7–4.0)
MCHC: 33.1 g/dL (ref 30.0–36.0)
MCV: 97.3 fl (ref 78.0–100.0)
MONO ABS: 0.5 10*3/uL (ref 0.1–1.0)
Monocytes Relative: 8.2 % (ref 3.0–12.0)
NEUTROS PCT: 47.4 % (ref 43.0–77.0)
Neutro Abs: 3 10*3/uL (ref 1.4–7.7)
PLATELETS: 220 10*3/uL (ref 150.0–400.0)
RBC: 3.86 Mil/uL — ABNORMAL LOW (ref 3.87–5.11)
RDW: 13.2 % (ref 11.5–15.5)
WBC: 6.4 10*3/uL (ref 4.0–10.5)

## 2013-10-12 LAB — URINALYSIS, ROUTINE W REFLEX MICROSCOPIC
Bilirubin Urine: NEGATIVE
Hgb urine dipstick: NEGATIVE
KETONES UR: NEGATIVE
Leukocytes, UA: NEGATIVE
Nitrite: NEGATIVE
SPECIFIC GRAVITY, URINE: 1.015 (ref 1.000–1.030)
Total Protein, Urine: NEGATIVE
UROBILINOGEN UA: 0.2 (ref 0.0–1.0)
Urine Glucose: NEGATIVE
pH: 6 (ref 5.0–8.0)

## 2013-10-12 LAB — BASIC METABOLIC PANEL
BUN: 17 mg/dL (ref 6–23)
CALCIUM: 9.3 mg/dL (ref 8.4–10.5)
CHLORIDE: 107 meq/L (ref 96–112)
CO2: 26 meq/L (ref 19–32)
CREATININE: 0.8 mg/dL (ref 0.4–1.2)
GFR: 69.72 mL/min (ref >60.00)
GLUCOSE: 82 mg/dL (ref 70–99)
Potassium: 5.1 mEq/L (ref 3.5–5.1)
Sodium: 140 mEq/L (ref 135–145)

## 2013-10-12 LAB — LIPID PANEL
CHOLESTEROL: 208 mg/dL — AB (ref 0–200)
HDL: 63.7 mg/dL (ref >39.00)
LDL CALC: 130 mg/dL — AB (ref 0–99)
NonHDL: 144.3
TRIGLYCERIDES: 73 mg/dL (ref 0.0–149.0)
Total CHOL/HDL Ratio: 3
VLDL: 14.6 mg/dL (ref 0.0–40.0)

## 2013-10-12 LAB — HEPATIC FUNCTION PANEL
ALT: 16 U/L (ref 0–35)
AST: 12 U/L (ref 0–37)
Albumin: 3.9 g/dL (ref 3.5–5.2)
Alkaline Phosphatase: 72 U/L (ref 39–117)
BILIRUBIN DIRECT: 0.1 mg/dL (ref 0.0–0.3)
Total Bilirubin: 0.6 mg/dL (ref 0.2–1.2)
Total Protein: 6.3 g/dL (ref 6.0–8.3)

## 2013-10-12 LAB — VITAMIN B12: Vitamin B-12: 649 pg/mL (ref 211–911)

## 2013-10-12 LAB — FERRITIN: Ferritin: 18.2 ng/mL (ref 10.0–291.0)

## 2013-10-12 LAB — IBC PANEL
Iron: 60 ug/dL (ref 42–145)
Saturation Ratios: 12.7 % — ABNORMAL LOW (ref 20.0–50.0)
Transferrin: 336.4 mg/dL (ref 212.0–360.0)

## 2013-10-12 NOTE — Telephone Encounter (Signed)
Order placed for bone density.  I did not get it to print.  Can you show me how to print order.  Thanks.

## 2013-10-12 NOTE — Telephone Encounter (Signed)
Order printed & given to [facility]

## 2013-10-12 NOTE — Telephone Encounter (Signed)
Pt notified & willing to proceed with Bone Density (will need to place & print order)

## 2013-10-21 DIAGNOSIS — H04569 Stenosis of unspecified lacrimal punctum: Secondary | ICD-10-CM | POA: Diagnosis not present

## 2013-10-25 ENCOUNTER — Ambulatory Visit: Payer: Self-pay | Admitting: Gastroenterology

## 2013-10-25 ENCOUNTER — Other Ambulatory Visit: Payer: Medicare Other

## 2013-10-25 DIAGNOSIS — N281 Cyst of kidney, acquired: Secondary | ICD-10-CM | POA: Diagnosis not present

## 2013-10-25 DIAGNOSIS — K745 Biliary cirrhosis, unspecified: Secondary | ICD-10-CM | POA: Diagnosis not present

## 2013-10-25 DIAGNOSIS — Q619 Cystic kidney disease, unspecified: Secondary | ICD-10-CM | POA: Diagnosis not present

## 2013-10-26 ENCOUNTER — Other Ambulatory Visit (INDEPENDENT_AMBULATORY_CARE_PROVIDER_SITE_OTHER): Payer: Medicare Other

## 2013-10-26 DIAGNOSIS — N289 Disorder of kidney and ureter, unspecified: Secondary | ICD-10-CM

## 2013-10-26 LAB — BASIC METABOLIC PANEL
BUN: 17 mg/dL (ref 6–23)
CO2: 20 mEq/L (ref 19–32)
CREATININE: 0.9 mg/dL (ref 0.4–1.2)
Calcium: 9 mg/dL (ref 8.4–10.5)
Chloride: 111 mEq/L (ref 96–112)
GFR: 66.02 mL/min (ref >60.00)
GLUCOSE: 114 mg/dL — AB (ref 70–99)
POTASSIUM: 4.3 meq/L (ref 3.5–5.1)
Sodium: 142 mEq/L (ref 135–145)

## 2013-10-27 ENCOUNTER — Encounter: Payer: Self-pay | Admitting: *Deleted

## 2013-11-06 ENCOUNTER — Other Ambulatory Visit: Payer: Self-pay | Admitting: Internal Medicine

## 2013-11-10 DIAGNOSIS — M810 Age-related osteoporosis without current pathological fracture: Secondary | ICD-10-CM | POA: Diagnosis not present

## 2013-11-17 DIAGNOSIS — M810 Age-related osteoporosis without current pathological fracture: Secondary | ICD-10-CM | POA: Diagnosis not present

## 2013-12-08 ENCOUNTER — Other Ambulatory Visit: Payer: Self-pay | Admitting: Internal Medicine

## 2013-12-13 ENCOUNTER — Other Ambulatory Visit: Payer: Self-pay | Admitting: Internal Medicine

## 2013-12-23 DIAGNOSIS — J301 Allergic rhinitis due to pollen: Secondary | ICD-10-CM | POA: Diagnosis not present

## 2013-12-23 DIAGNOSIS — J310 Chronic rhinitis: Secondary | ICD-10-CM | POA: Diagnosis not present

## 2013-12-23 DIAGNOSIS — J309 Allergic rhinitis, unspecified: Secondary | ICD-10-CM | POA: Diagnosis not present

## 2014-01-10 ENCOUNTER — Encounter: Payer: Self-pay | Admitting: Internal Medicine

## 2014-01-10 DIAGNOSIS — M79609 Pain in unspecified limb: Secondary | ICD-10-CM | POA: Diagnosis not present

## 2014-01-10 DIAGNOSIS — M7989 Other specified soft tissue disorders: Secondary | ICD-10-CM | POA: Diagnosis not present

## 2014-01-10 DIAGNOSIS — I872 Venous insufficiency (chronic) (peripheral): Secondary | ICD-10-CM | POA: Diagnosis not present

## 2014-01-10 DIAGNOSIS — I890 Lymphedema, not elsewhere classified: Secondary | ICD-10-CM | POA: Diagnosis not present

## 2014-01-11 ENCOUNTER — Encounter: Payer: Self-pay | Admitting: Internal Medicine

## 2014-01-11 ENCOUNTER — Ambulatory Visit (INDEPENDENT_AMBULATORY_CARE_PROVIDER_SITE_OTHER): Payer: Medicare Other | Admitting: Internal Medicine

## 2014-01-11 VITALS — BP 130/70 | HR 64 | Temp 98.2°F | Ht 63.0 in | Wt 146.5 lb

## 2014-01-11 DIAGNOSIS — E7800 Pure hypercholesterolemia, unspecified: Secondary | ICD-10-CM

## 2014-01-11 DIAGNOSIS — K754 Autoimmune hepatitis: Secondary | ICD-10-CM | POA: Diagnosis not present

## 2014-01-11 DIAGNOSIS — I10 Essential (primary) hypertension: Secondary | ICD-10-CM | POA: Diagnosis not present

## 2014-01-11 DIAGNOSIS — K219 Gastro-esophageal reflux disease without esophagitis: Secondary | ICD-10-CM

## 2014-01-11 DIAGNOSIS — Z23 Encounter for immunization: Secondary | ICD-10-CM

## 2014-01-11 DIAGNOSIS — B029 Zoster without complications: Secondary | ICD-10-CM

## 2014-01-11 DIAGNOSIS — E039 Hypothyroidism, unspecified: Secondary | ICD-10-CM | POA: Diagnosis not present

## 2014-01-11 MED ORDER — TRAMADOL HCL 50 MG PO TABS: ORAL_TABLET | ORAL | Status: DC

## 2014-01-11 NOTE — Progress Notes (Signed)
Subjective:    [name], female    DOB: [birth year], 80 y.o.   MRN: [id number]  HPI 80 year old female with past history of hypertension, hypercholesterolemia, FCD, hypothyroidism and autoimmune hepatitis who comes in today for a scheduled follow up. Diagnosed with shingles in 3/15.   Involved the right side of her head.  Saw opthalmology.  No eye involvement.  On gabapentin. Still with increased pain.  Takes tramadol one daily prn.  Just saw Dr [name].  Liver function wnl.  Ultrasound stable.  Overall she feels she is doing relatively well.  Some increased stress with the recent death of her sister.  Feels she is coping relatively well.  Does not feel she needs any further intervention. Some constipation.  Takes stool softener prn.  Works well for her.     Past Medical History  Diagnosis Date  . Hypertension   . Hypercholesterolemia   . 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 Fibrocystic breast disease   . Osteoarthritis   . Inflammatory arthritis   . Autoimmune hepatitis     followed by Dr [name]  . Neuropathy   . History of colon polyps 2000    Current Outpatient Prescriptions on File Prior to Visit  Medication Sig Dispense Refill  . amitriptyline (ELAVIL) 10 MG tablet TAKE 2 TABLETS BY MOUTH EVERY NIGHT AT BEDTIME 60 tablet 5  . amLODipine (NORVASC) 10 MG tablet TAKE 1 TABLET BY MOUTH EVERY DAY 90 tablet 1  . aspirin 81 MG tablet Take 81 mg by mouth daily.      . fluticasone (FLONASE) 50 MCG/ACT nasal spray INSTILL 2 SPRAYS IN EACH NOSTRIL DAILY 48 g 0  . gabapentin (NEURONTIN) 300 MG capsule TAKE 2 CAPSULES BY MOUTH FOUR TIMES DAILY 720 capsule 1  . levothyroxine (SYNTHROID, LEVOTHROID) 112 MCG tablet Take 1 tablet (112 mcg total) by mouth daily. 1 tablet 30 tablet 11  . metoprolol (LOPRESSOR) 50 MG tablet TAKE 1 TABLET BY MOUTH TWICE DAILY. 180 tablet 1  . omeprazole (PRILOSEC) 20 MG capsule TAKE 1  CAPSULE BY MOUTH TWICE DAILY 180 capsule 0  . traMADol (ULTRAM) 50 MG tablet One tablet q 8-12 hours prn 60 tablet 0  . ursodiol (ACTIGALL) 300 MG capsule Take 300 mg by mouth. 2 morning - 1 night      No current facility-administered medications on file prior to visit.    Review of Systems Patient denies any lightheadedness or dizziness.  Still some head pain s/p shingles.  Rash - gone.  No significant sinus symptoms.  Eating and drinking well.  No nausea or vomiting.  No acid reflux.  No sob.  No chest pain or tightness.  Some constipation.  Just saw Dr Donnella Sham.  Liver labs wnl.  Ultrasound stable.        Objective:   Physical Exam  Filed Vitals:   01/11/14 1326  BP: 130/70  Pulse: 64  Temp: 98.2 F (36.8 C)   Blood pressure recheck:  60/32  80 year old female in no acute distress.   HEENT:  Nares- clear.  Oropharynx - without lesions. NECK:  Supple.  Nontender.  No audible bruit.  HEART:  Appears to be regular. LUNGS:  No crackles or wheezing audible.  Respirations even and unlabored.  RADIAL PULSE:  Equal bilaterally.  ABDOMEN:  Soft, nontender.  Bowel sounds present and normal.  No audible abdominal bruit.   EXTREMITIES:  No increased edema present.  DP pulses palpable and equal bilaterally.          Assessment & Plan:  1. Essential hypertension Blood pressure as outlined.  Same medication regimen.  Follow.  Check metabolic panel.    2. Autoimmune hepatitis Recent ultrasound stable.  Sees Dr [name].  Liver panel last checked 6/15 wnl.    3. Gastroesophageal reflux disease, esophagitis presence not specified Controlled.  On omeprazole.   4. Hypercholesterolemia Low cholesterol diet and exercise.  Check lipid panel next labs.   Lab Results  Component Value Date   CHOL 208* 10/12/2013   HDL 63.70 10/12/2013   LDLCALC 130* 10/12/2013   LDLDIRECT 143.5 12/17/2012   TRIG 73.0 10/12/2013   CHOLHDL 3 10/12/2013   5. Shingles, right forehead Persistent pain.  On  gabapentin.  Request refill on tramadol.  Takes one daily.  Follow.  Discussed trying to taper off tramadol.  Discussed possible interaction with amitriptyline.    HEALTH MAINTENANCE.  Physical 09/09/13 .  She is s/p hysterectomy.  Mammogram 04/01/13 - BiRADS I.

## 2014-01-16 ENCOUNTER — Other Ambulatory Visit: Payer: Self-pay | Admitting: Internal Medicine

## 2014-01-16 ENCOUNTER — Encounter: Payer: Self-pay | Admitting: Internal Medicine

## 2014-01-24 ENCOUNTER — Other Ambulatory Visit: Payer: Self-pay | Admitting: Internal Medicine

## 2014-02-13 ENCOUNTER — Other Ambulatory Visit: Payer: Self-pay | Admitting: Internal Medicine

## 2014-02-17 DIAGNOSIS — H04563 Stenosis of bilateral lacrimal punctum: Secondary | ICD-10-CM | POA: Diagnosis not present

## 2014-02-22 ENCOUNTER — Ambulatory Visit (INDEPENDENT_AMBULATORY_CARE_PROVIDER_SITE_OTHER): Payer: Medicare Other | Admitting: *Deleted

## 2014-02-22 DIAGNOSIS — Z23 Encounter for immunization: Secondary | ICD-10-CM | POA: Diagnosis not present

## 2014-02-23 DIAGNOSIS — K754 Autoimmune hepatitis: Secondary | ICD-10-CM | POA: Diagnosis not present

## 2014-02-23 DIAGNOSIS — K743 Primary biliary cirrhosis: Secondary | ICD-10-CM | POA: Diagnosis not present

## 2014-03-17 ENCOUNTER — Encounter: Payer: Self-pay | Admitting: General Surgery

## 2014-03-24 ENCOUNTER — Encounter: Payer: Self-pay | Admitting: General Surgery

## 2014-04-11 ENCOUNTER — Other Ambulatory Visit: Payer: Medicare Other

## 2014-04-12 ENCOUNTER — Other Ambulatory Visit (INDEPENDENT_AMBULATORY_CARE_PROVIDER_SITE_OTHER): Payer: Medicare Other

## 2014-04-12 DIAGNOSIS — E7800 Pure hypercholesterolemia, unspecified: Secondary | ICD-10-CM

## 2014-04-12 DIAGNOSIS — E039 Hypothyroidism, unspecified: Secondary | ICD-10-CM

## 2014-04-12 DIAGNOSIS — K754 Autoimmune hepatitis: Secondary | ICD-10-CM

## 2014-04-12 DIAGNOSIS — I10 Essential (primary) hypertension: Secondary | ICD-10-CM

## 2014-04-12 LAB — CBC WITH DIFFERENTIAL/PLATELET
Basophils Absolute: 0 10*3/uL (ref 0.0–0.1)
Basophils Relative: 0.5 % (ref 0.0–3.0)
EOS ABS: 0.1 10*3/uL (ref 0.0–0.7)
Eosinophils Relative: 1.6 % (ref 0.0–5.0)
HCT: 37.2 % (ref 36.0–46.0)
HEMOGLOBIN: 12.6 g/dL (ref 12.0–15.0)
Lymphocytes Relative: 38.9 % (ref 12.0–46.0)
Lymphs Abs: 2.5 10*3/uL (ref 0.7–4.0)
MCHC: 34 g/dL (ref 30.0–36.0)
MCV: 93.8 fl (ref 78.0–100.0)
MONOS PCT: 9.4 % (ref 3.0–12.0)
Monocytes Absolute: 0.6 10*3/uL (ref 0.1–1.0)
NEUTROS PCT: 49.6 % (ref 43.0–77.0)
Neutro Abs: 3.2 10*3/uL (ref 1.4–7.7)
Platelets: 220 10*3/uL (ref 150.0–400.0)
RBC: 3.97 Mil/uL (ref 3.87–5.11)
RDW: 13.6 % (ref 11.5–15.5)
WBC: 6.5 10*3/uL (ref 4.0–10.5)

## 2014-04-12 LAB — LIPID PANEL
CHOL/HDL RATIO: 3
Cholesterol: 201 mg/dL — ABNORMAL HIGH (ref 0–200)
HDL: 60.9 mg/dL (ref >39.00)
LDL Cholesterol: 113 mg/dL — ABNORMAL HIGH (ref 0–99)
NonHDL: 140.1
Triglycerides: 138 mg/dL (ref 0.0–149.0)
VLDL: 27.6 mg/dL (ref 0.0–40.0)

## 2014-04-12 LAB — HEPATIC FUNCTION PANEL
ALK PHOS: 71 U/L (ref 39–117)
ALT: 18 U/L (ref 0–35)
AST: 14 U/L (ref 0–37)
Albumin: 4.2 g/dL (ref 3.5–5.2)
BILIRUBIN TOTAL: 0.4 mg/dL (ref 0.2–1.2)
Bilirubin, Direct: 0.1 mg/dL (ref 0.0–0.3)
Total Protein: 6.7 g/dL (ref 6.0–8.3)

## 2014-04-12 LAB — BASIC METABOLIC PANEL
BUN: 21 mg/dL (ref 6–23)
CO2: 23 meq/L (ref 19–32)
CREATININE: 1.03 mg/dL (ref 0.40–1.20)
Calcium: 9.2 mg/dL (ref 8.4–10.5)
Chloride: 111 mEq/L (ref 96–112)
GFR: 54.28 mL/min — ABNORMAL LOW (ref >60.00)
GLUCOSE: 88 mg/dL (ref 70–99)
Potassium: 4.5 mEq/L (ref 3.5–5.1)
Sodium: 139 mEq/L (ref 135–145)

## 2014-04-12 LAB — TSH: TSH: 1.95 u[IU]/mL (ref 0.35–4.50)

## 2014-04-14 ENCOUNTER — Ambulatory Visit (INDEPENDENT_AMBULATORY_CARE_PROVIDER_SITE_OTHER): Payer: Medicare Other | Admitting: Internal Medicine

## 2014-04-14 ENCOUNTER — Encounter: Payer: Self-pay | Admitting: Internal Medicine

## 2014-04-14 ENCOUNTER — Other Ambulatory Visit: Payer: Self-pay | Admitting: Internal Medicine

## 2014-04-14 VITALS — BP 140/60 | HR 69 | Temp 98.5°F | Ht 63.0 in | Wt 148.8 lb

## 2014-04-14 DIAGNOSIS — E7800 Pure hypercholesterolemia, unspecified: Secondary | ICD-10-CM

## 2014-04-14 DIAGNOSIS — Z1239 Encounter for other screening for malignant neoplasm of breast: Secondary | ICD-10-CM

## 2014-04-14 DIAGNOSIS — I10 Essential (primary) hypertension: Secondary | ICD-10-CM

## 2014-04-14 DIAGNOSIS — Z0000 Encounter for general adult medical examination without abnormal findings: Secondary | ICD-10-CM | POA: Diagnosis not present

## 2014-04-14 DIAGNOSIS — K219 Gastro-esophageal reflux disease without esophagitis: Secondary | ICD-10-CM

## 2014-04-14 DIAGNOSIS — K754 Autoimmune hepatitis: Secondary | ICD-10-CM | POA: Diagnosis not present

## 2014-04-14 DIAGNOSIS — E039 Hypothyroidism, unspecified: Secondary | ICD-10-CM

## 2014-04-14 DIAGNOSIS — R002 Palpitations: Secondary | ICD-10-CM | POA: Diagnosis not present

## 2014-04-14 MED ORDER — LEVOTHYROXINE SODIUM 112 MCG PO TABS: 112.0000 ug | ORAL_TABLET | Freq: Every day | ORAL | Status: DC

## 2014-04-14 MED ORDER — AMITRIPTYLINE HCL 10 MG PO TABS: 20.0000 mg | ORAL_TABLET | Freq: Every day | ORAL | Status: DC

## 2014-04-14 MED ORDER — GABAPENTIN 300 MG PO CAPS: ORAL_CAPSULE | ORAL | Status: DC

## 2014-04-14 MED ORDER — METOPROLOL TARTRATE 50 MG PO TABS: 50.0000 mg | ORAL_TABLET | Freq: Two times a day (BID) | ORAL | Status: DC

## 2014-04-14 MED ORDER — OMEPRAZOLE 20 MG PO CPDR: 20.0000 mg | DELAYED_RELEASE_CAPSULE | Freq: Two times a day (BID) | ORAL | Status: DC

## 2014-04-14 MED ORDER — TRAMADOL HCL 50 MG PO TABS: ORAL_TABLET | ORAL | Status: DC

## 2014-04-14 NOTE — Progress Notes (Signed)
Pre visit review using our clinic review tool, if applicable. No additional management support is needed unless otherwise documented below in the visit note. 

## 2014-04-14 NOTE — Patient Instructions (Signed)
Taper amitriptyline.  Take one per day for one week and then one every other day for one week.   Then stop.

## 2014-04-14 NOTE — Progress Notes (Signed)
[name], female   DOB: 26-Apr-1928, 81 y.o.   MRN: [id number]   Subjective:    [name], female    DOB: 10-03-1928, 81 y.o.   MRN: [id number]  HPI  Patient here for a scheduled follow up.  States she has been having problems with palpitations.  Started/worsened over the last two weeks.  Occurs while sitting.  Occurring daily.  Notices while watching TV.  Notices heart beating faster.  May last 10 minutes.  No increased caffeine.  No decongestant.  Increased right shoulder pain.  Has a tear.  Takes tramadol - one daily.  We discussed tapering her amitriptyline.  Tries to stay active.     Past Medical History  Diagnosis Date  . Hypertension   . Hypercholesterolemia   . 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 Fibrocystic breast disease   . Osteoarthritis   . Inflammatory arthritis   . Autoimmune hepatitis     followed by Dr [name]  . Neuropathy   . History of colon polyps 2000    Current Outpatient Prescriptions on File Prior to Visit  Medication Sig Dispense Refill  . amLODipine (NORVASC) 10 MG tablet TAKE ONE TABLET BY MOUTH EVERY DAY 90 tablet 1  . aspirin 81 MG tablet Take 81 mg by mouth daily.      . ursodiol (ACTIGALL) 300 MG capsule Take 300 mg by mouth. 2 morning - 1 night      No current facility-administered medications on file prior to visit.    Review of Systems  Constitutional: Negative for fatigue and unexpected weight change.  HENT: Negative for congestion and sinus pressure.   Respiratory: Negative for cough, chest tightness and shortness of breath.   Cardiovascular: Positive for palpitations (increased heart rate.  occurring daily.  ). Negative for chest pain and leg swelling.  Gastrointestinal: Negative for nausea, vomiting, abdominal pain, diarrhea and constipation.  Musculoskeletal: Negative for back pain and joint swelling.  Neurological: Negative for dizziness,  light-headedness and headaches.       Objective:     Blood pressure recheck:  130/72  Physical Exam  HENT:  Nose: Nose normal.  Mouth/Throat: Oropharynx is clear and moist.  Neck: Neck supple. No thyromegaly present.  Cardiovascular: Normal rate and regular rhythm.   Pulmonary/Chest: Breath sounds normal. No respiratory distress. She has no wheezes.  Abdominal: Soft. Bowel sounds are normal. There is no tenderness.  Musculoskeletal: She exhibits no edema or tenderness.  Lymphadenopathy:    She has no cervical adenopathy.    BP 140/60 mmHg  Pulse 69  Temp(Src) 98.5 F (36.9 C) (Oral)  Ht 5' 3"  (1.6 m)  Wt 148 lb 12 oz (67.473 kg)  BMI 26.36 kg/m2  SpO2 98% Wt Readings from Last 3 Encounters:  04/14/14 148 lb 12 oz (67.473 kg)  01/11/14 146 lb 8 oz (66.452 kg)  09/09/13 147 lb 4 oz (66.792 kg)     Lab Results  Component Value Date   WBC 6.5 04/12/2014   HGB 12.6 04/12/2014   HCT 37.2 04/12/2014   PLT 220.0 04/12/2014   GLUCOSE 88 04/12/2014   CHOL 201* 04/12/2014   TRIG 138.0 04/12/2014   HDL 60.90 04/12/2014   LDLDIRECT 143.5 12/17/2012   LDLCALC 113* 04/12/2014   ALT 18 04/12/2014   AST 14 04/12/2014   NA 139 04/12/2014   K 4.5 04/12/2014   CL 111 04/12/2014  CREATININE 1.03 04/12/2014   BUN 21 04/12/2014   CO2 23 04/12/2014   TSH 1.95 04/12/2014   INR 0.9 09/02/2012       Assessment & Plan:   Problem List Items Addressed This Visit    Autoimmune hepatitis    Followed by Dr [name].  See last note for details regarding ultrasound.  No acute change.  Currently asymptomatic.  Follow liver function tests.        GERD (gastroesophageal reflux disease)    Controlled on omeprazole.         Relevant Medications   omeprazole (PRILOSEC) capsule   Health care maintenance    Physical 09/09/13.  Mammogram scheduled today.        Hypercholesterolemia    Low cholesterol diet and exercise.  Follow lipid panel.       Relevant Medications    metoprolol (LOPRESSOR) tablet   Hypertension    Blood pressure doing well.  Same medication regimen.  Follow met b       Relevant Medications   metoprolol (LOPRESSOR) tablet   Hypothyroidism    On thyroid replacement.  Follow tsh.        Relevant Medications   metoprolol (LOPRESSOR) tablet   Palpitations - Primary    EKG revealed SR with no acute ischemic changes.  With the increased heart rate and palpitations, I do feel further cardiac w/up is warranted.  May need Holter, ECHO, etc.  Refer to cardiology for further evaluation and w/up.        Relevant Orders   EKG 12-Lead (Completed)   Ambulatory referral to Cardiology    Other Visit Diagnoses    Breast cancer screening        Relevant Orders    MM DIGITAL SCREENING BILATERAL        Einar Pheasant, MD

## 2014-04-17 ENCOUNTER — Encounter: Payer: Self-pay | Admitting: Internal Medicine

## 2014-04-17 DIAGNOSIS — Z0000 Encounter for general adult medical examination without abnormal findings: Secondary | ICD-10-CM | POA: Insufficient documentation

## 2014-04-17 DIAGNOSIS — R002 Palpitations: Secondary | ICD-10-CM | POA: Insufficient documentation

## 2014-04-17 NOTE — Assessment & Plan Note (Signed)
Physical 09/09/13.  Mammogram scheduled today.

## 2014-04-17 NOTE — Assessment & Plan Note (Signed)
EKG revealed SR with no acute ischemic changes.  With the increased heart rate and palpitations, I do feel further cardiac w/up is warranted.  May need Holter, ECHO, etc.  Refer to cardiology for further evaluation and w/up.

## 2014-04-17 NOTE — Assessment & Plan Note (Signed)
Followed by Dr [name].  See last note for details regarding ultrasound.  No acute change.  Currently asymptomatic.  Follow liver function tests.

## 2014-04-17 NOTE — Assessment & Plan Note (Signed)
Controlled on omeprazole.   

## 2014-04-17 NOTE — Assessment & Plan Note (Signed)
Blood pressure doing well.  Same medication regimen.  Follow met b

## 2014-04-17 NOTE — Assessment & Plan Note (Signed)
Low cholesterol diet and exercise.  Follow lipid panel.   

## 2014-04-17 NOTE — Assessment & Plan Note (Signed)
On thyroid replacement.  Follow tsh.  

## 2014-04-21 ENCOUNTER — Emergency Department: Payer: Self-pay | Admitting: Emergency Medicine

## 2014-04-21 DIAGNOSIS — Z90710 Acquired absence of both cervix and uterus: Secondary | ICD-10-CM | POA: Diagnosis not present

## 2014-04-21 DIAGNOSIS — K6389 Other specified diseases of intestine: Secondary | ICD-10-CM | POA: Diagnosis not present

## 2014-04-21 DIAGNOSIS — Z9089 Acquired absence of other organs: Secondary | ICD-10-CM | POA: Diagnosis not present

## 2014-04-21 DIAGNOSIS — Z7982 Long term (current) use of aspirin: Secondary | ICD-10-CM | POA: Diagnosis not present

## 2014-04-21 DIAGNOSIS — Z79899 Other long term (current) drug therapy: Secondary | ICD-10-CM | POA: Diagnosis not present

## 2014-04-21 DIAGNOSIS — K529 Noninfective gastroenteritis and colitis, unspecified: Secondary | ICD-10-CM | POA: Diagnosis not present

## 2014-04-21 DIAGNOSIS — R1010 Upper abdominal pain, unspecified: Secondary | ICD-10-CM | POA: Diagnosis not present

## 2014-04-21 DIAGNOSIS — I10 Essential (primary) hypertension: Secondary | ICD-10-CM | POA: Diagnosis not present

## 2014-04-22 DIAGNOSIS — K6389 Other specified diseases of intestine: Secondary | ICD-10-CM | POA: Diagnosis not present

## 2014-04-26 ENCOUNTER — Ambulatory Visit: Payer: Medicare Other | Admitting: Cardiovascular Disease

## 2014-04-27 DIAGNOSIS — K529 Noninfective gastroenteritis and colitis, unspecified: Secondary | ICD-10-CM | POA: Diagnosis not present

## 2014-04-27 DIAGNOSIS — R197 Diarrhea, unspecified: Secondary | ICD-10-CM | POA: Diagnosis not present

## 2014-04-29 ENCOUNTER — Ambulatory Visit: Payer: Medicare Other | Admitting: Cardiovascular Disease

## 2014-05-05 ENCOUNTER — Encounter: Payer: Self-pay | Admitting: Internal Medicine

## 2014-05-05 ENCOUNTER — Ambulatory Visit (INDEPENDENT_AMBULATORY_CARE_PROVIDER_SITE_OTHER): Payer: Medicare Other | Admitting: Internal Medicine

## 2014-05-05 ENCOUNTER — Other Ambulatory Visit: Payer: Self-pay | Admitting: Gastroenterology

## 2014-05-05 ENCOUNTER — Ambulatory Visit: Payer: Self-pay | Admitting: Internal Medicine

## 2014-05-05 VITALS — BP 122/60 | HR 69 | Temp 97.9°F | Ht 63.0 in | Wt 154.0 lb

## 2014-05-05 DIAGNOSIS — R197 Diarrhea, unspecified: Secondary | ICD-10-CM | POA: Diagnosis not present

## 2014-05-05 DIAGNOSIS — I809 Phlebitis and thrombophlebitis of unspecified site: Secondary | ICD-10-CM | POA: Diagnosis not present

## 2014-05-05 DIAGNOSIS — K529 Noninfective gastroenteritis and colitis, unspecified: Secondary | ICD-10-CM | POA: Diagnosis not present

## 2014-05-05 DIAGNOSIS — N289 Disorder of kidney and ureter, unspecified: Secondary | ICD-10-CM | POA: Diagnosis not present

## 2014-05-05 DIAGNOSIS — L03116 Cellulitis of left lower limb: Secondary | ICD-10-CM

## 2014-05-05 DIAGNOSIS — K754 Autoimmune hepatitis: Secondary | ICD-10-CM

## 2014-05-05 DIAGNOSIS — R609 Edema, unspecified: Secondary | ICD-10-CM | POA: Diagnosis not present

## 2014-05-05 DIAGNOSIS — M7989 Other specified soft tissue disorders: Secondary | ICD-10-CM | POA: Diagnosis not present

## 2014-05-05 DIAGNOSIS — L539 Erythematous condition, unspecified: Secondary | ICD-10-CM | POA: Diagnosis not present

## 2014-05-05 DIAGNOSIS — M7122 Synovial cyst of popliteal space [Baker], left knee: Secondary | ICD-10-CM | POA: Diagnosis not present

## 2014-05-05 DIAGNOSIS — I10 Essential (primary) hypertension: Secondary | ICD-10-CM | POA: Diagnosis not present

## 2014-05-05 MED ORDER — CEPHALEXIN 500 MG PO CAPS: 500.0000 mg | ORAL_CAPSULE | Freq: Three times a day (TID) | ORAL | Status: DC

## 2014-05-05 NOTE — Progress Notes (Signed)
Rx called in 

## 2014-05-05 NOTE — Patient Instructions (Signed)
Take align - one per day 

## 2014-05-05 NOTE — Progress Notes (Signed)
Pre visit review using our clinic review tool, if applicable. No additional management support is needed unless otherwise documented below in the visit note. 

## 2014-05-06 ENCOUNTER — Encounter: Payer: Self-pay | Admitting: Internal Medicine

## 2014-05-06 ENCOUNTER — Other Ambulatory Visit (INDEPENDENT_AMBULATORY_CARE_PROVIDER_SITE_OTHER): Payer: Medicare Other

## 2014-05-06 DIAGNOSIS — O2200 Varicose veins of lower extremity in pregnancy, unspecified trimester: Secondary | ICD-10-CM | POA: Diagnosis not present

## 2014-05-06 DIAGNOSIS — I890 Lymphedema, not elsewhere classified: Secondary | ICD-10-CM | POA: Diagnosis not present

## 2014-05-06 DIAGNOSIS — L03116 Cellulitis of left lower limb: Secondary | ICD-10-CM

## 2014-05-06 DIAGNOSIS — N289 Disorder of kidney and ureter, unspecified: Secondary | ICD-10-CM | POA: Diagnosis not present

## 2014-05-06 DIAGNOSIS — R609 Edema, unspecified: Secondary | ICD-10-CM | POA: Diagnosis not present

## 2014-05-06 DIAGNOSIS — I808 Phlebitis and thrombophlebitis of other sites: Secondary | ICD-10-CM | POA: Diagnosis not present

## 2014-05-06 LAB — BASIC METABOLIC PANEL
BUN: 13 mg/dL (ref 6–23)
CALCIUM: 8.3 mg/dL — AB (ref 8.4–10.5)
CHLORIDE: 112 meq/L (ref 96–112)
CO2: 26 meq/L (ref 19–32)
Creatinine, Ser: 1.1 mg/dL (ref 0.40–1.20)
GFR: 50.3 mL/min — ABNORMAL LOW (ref >60.00)
Glucose, Bld: 83 mg/dL (ref 70–99)
Potassium: 3.6 mEq/L (ref 3.5–5.1)
SODIUM: 142 meq/L (ref 135–145)

## 2014-05-06 LAB — CBC WITH DIFFERENTIAL/PLATELET
Basophils Absolute: 0.1 10*3/uL (ref 0.0–0.1)
Basophils Relative: 0.7 % (ref 0.0–3.0)
EOS PCT: 1.7 % (ref 0.0–5.0)
Eosinophils Absolute: 0.1 10*3/uL (ref 0.0–0.7)
HCT: 33.5 % — ABNORMAL LOW (ref 36.0–46.0)
Hemoglobin: 11.1 g/dL — ABNORMAL LOW (ref 12.0–15.0)
Lymphocytes Relative: 39.4 % (ref 12.0–46.0)
Lymphs Abs: 3.1 10*3/uL (ref 0.7–4.0)
MCHC: 33.3 g/dL (ref 30.0–36.0)
MCV: 94.8 fl (ref 78.0–100.0)
MONOS PCT: 9 % (ref 3.0–12.0)
Monocytes Absolute: 0.7 10*3/uL (ref 0.1–1.0)
NEUTROS ABS: 3.9 10*3/uL (ref 1.4–7.7)
Neutrophils Relative %: 49.2 % (ref 43.0–77.0)
PLATELETS: 322 10*3/uL (ref 150.0–400.0)
RBC: 3.53 Mil/uL — ABNORMAL LOW (ref 3.87–5.11)
RDW: 15.7 % — AB (ref 11.5–15.5)
WBC: 7.9 10*3/uL (ref 4.0–10.5)

## 2014-05-06 NOTE — Assessment & Plan Note (Signed)
Followed by Dr. Skulskie 

## 2014-05-06 NOTE — Assessment & Plan Note (Signed)
Increased redness as outlined.  Pursue vascular w/up as outlined.  Keflex as directed.  Take a probiotic.  Monitor bowels.  We discussed risk of c. Diff.

## 2014-05-06 NOTE — Assessment & Plan Note (Signed)
Persistent.  Some better now.  Stool soft now.  Not as frequent.  Had CT.  Continue f/u with GI.  May need colonoscopy.

## 2014-05-06 NOTE — Assessment & Plan Note (Signed)
Increased lower extremity edema as outlined.  Check lower extremity duplex to confirm no DVT.  Does appear to have superficial thrombophlebitis.  Refer to vascular surgery for further evaluation and treatment recommendations.  Leg elevation.  Will hold on diuretic especially given persistent bowel issue and concern over intravascular volume depletion.

## 2014-05-06 NOTE — Assessment & Plan Note (Signed)
Found on recent labs.  Will recheck.  Stay hydrated.

## 2014-05-06 NOTE — Assessment & Plan Note (Signed)
Exam appears to be c/w thrombophlebitis.  alleve tonight and tomorrow.  Gentle use.  Has seen Dr Ronalee Belts previously.  Refer back for evaluation.  Will obtain lower extremity duplex today to confirm no DVT - given exam findings.  Keep legs elevated.

## 2014-05-06 NOTE — Progress Notes (Addendum)
[name], female   DOB: January 30, 1929, 81 y.o.   MRN: [id number]   Subjective:    [name], female    DOB: 06-Dec-1928, 81 y.o.   MRN: [id number]  HPI  Patient here as a work in with concerns regarding lower extremity swelling, redness and pain.  Was sent over as a work in by Dr [name].  Has been having diarrhea.  Present now for 2 1/2 weeks.  Was seen in ER.  Had CT scan.  Found colitis.  C.diff negative.  Referred to Dr [name].  Stool is some better.  Still soft and frequent.  Just completed cipro and flagyl.  Some occasional abdominal discomfort.  Is eating better now.  Has been eating more foods with increased sodium.  Drinking pedialite.  Had f/u today with Dr [name].  Increased swelling and redness of lower legs noted.  Sent over here for further evaluation.  No sob.  No chest pain.  Has been more sedentary for 2 1/2 weeks - with increased diarrhea.     Past Medical History  Diagnosis Date  . Hypertension   . Hypercholesterolemia   . 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 Fibrocystic breast disease   . Osteoarthritis   . Inflammatory arthritis   . Autoimmune hepatitis     followed by Dr [name]  . Neuropathy   . History of colon polyps 2000    Current Outpatient Prescriptions on File Prior to Visit  Medication Sig Dispense Refill  . amitriptyline (ELAVIL) 10 MG tablet Take 2 tablets (20 mg total) by mouth at bedtime. 180 tablet 1  . amLODipine (NORVASC) 10 MG tablet TAKE ONE TABLET BY MOUTH EVERY DAY 90 tablet 1  . aspirin 81 MG tablet Take 81 mg by mouth daily.      Marland Kitchen gabapentin (NEURONTIN) 300 MG capsule TAKE 2 CAPSULES BY MOUTH FOUR TIMES DAILY. 720 capsule 1  . levothyroxine (SYNTHROID, LEVOTHROID) 112 MCG tablet TAKE 1 TABLET BY MOUTH EVERY DAY BEFORE BREAKFAST 90 tablet 3  . metoprolol (LOPRESSOR) 50 MG tablet Take 1 tablet (50 mg total) by mouth 2 (two) times daily. 180 tablet 1  .  omeprazole (PRILOSEC) 20 MG capsule Take 1 capsule (20 mg total) by mouth 2 (two) times daily. 180 capsule 3  . traMADol (ULTRAM) 50 MG tablet One daily prn 60 tablet 1  . ursodiol (ACTIGALL) 300 MG capsule Take 300 mg by mouth. 2 morning - 1 night      No current facility-administered medications on file prior to visit.    Review of Systems  Constitutional: Positive for appetite change (has had decreased appetite recently.  has improved some recently. ) and fatigue.  HENT: Negative for congestion and sinus pressure.   Respiratory: Negative for cough, chest tightness and shortness of breath.   Cardiovascular: Positive for leg swelling (increased bilateral leg swelling.  left greater than right.  ). Negative for chest pain and palpitations.  Gastrointestinal: Positive for nausea (some intermittent nausea.), abdominal pain (occasional abdominal discomfort.  ) and diarrhea (some better now.  multiple soft stools. ). Negative for vomiting.  Genitourinary: Negative for dysuria and difficulty urinating.  Musculoskeletal: Negative for back pain and joint swelling.  Skin:       Increased erythema noted surrounding superficial veins - hard.  Appears to be c/w thrombophlebitis.    Neurological: Negative for dizziness, light-headedness and headaches.  Objective:    Physical Exam  Constitutional: She appears well-developed and well-nourished. No distress.  HENT:  Nose: Nose normal.  Mouth/Throat: Oropharynx is clear and moist.  Neck: Neck supple. No thyromegaly present.  Cardiovascular: Normal rate and regular rhythm.   Pulmonary/Chest: Breath sounds normal. No respiratory distress. She has no wheezes.  Abdominal: Soft. Bowel sounds are normal. There is tenderness (minimal tenderness to palpation - lower abdomen. ).  Musculoskeletal: She exhibits edema and tenderness.  Bilateral lower extremity swelling - left greater than right.  Increased erythema and tenderness - left lower leg - over  the superficial veins.    Lymphadenopathy:    She has no cervical adenopathy.  Skin: No rash noted. There is erythema.    BP 122/60 mmHg  Pulse 69  Temp(Src) 97.9 F (36.6 C) (Oral)  Ht 5\' 3"  (1.6 m)  Wt 154 lb (69.854 kg)  BMI 27.29 kg/m2  SpO2 98% Wt Readings from Last 3 Encounters:  05/05/14 154 lb (69.854 kg)  04/14/14 148 lb 12 oz (67.473 kg)  01/11/14 146 lb 8 oz (66.452 kg)     Lab Results  Component Value Date   WBC 6.5 04/12/2014   HGB 12.6 04/12/2014   HCT 37.2 04/12/2014   PLT 220.0 04/12/2014   GLUCOSE 88 04/12/2014   CHOL 201* 04/12/2014   TRIG 138.0 04/12/2014   HDL 60.90 04/12/2014   LDLDIRECT 143.5 12/17/2012   LDLCALC 113* 04/12/2014   ALT 18 04/12/2014   AST 14 04/12/2014   NA 139 04/12/2014   K 4.5 04/12/2014   CL 111 04/12/2014   CREATININE 1.03 04/12/2014   BUN 21 04/12/2014   CO2 23 04/12/2014   TSH 1.95 04/12/2014   INR 0.9 09/02/2012        Assessment & Plan:   Problem List Items Addressed This Visit    Autoimmune hepatitis    Followed by Dr [name].        Cellulitis of leg, left    Increased redness as outlined.  Pursue vascular w/up as outlined.  Keflex as directed.  Take a probiotic.  Monitor bowels.  We discussed risk of c. Diff.        Relevant Orders   CBC with Differential/Platelet   Diarrhea    Persistent.  Some better now.  Stool soft now.  Not as frequent.  Had CT.  Continue f/u with GI.  May need colonoscopy.        Edema    Increased lower extremity edema as outlined.  Check lower extremity duplex to confirm no DVT.  Does appear to have superficial thrombophlebitis.  Refer to vascular surgery for further evaluation and treatment recommendations.  Leg elevation.  Will hold on diuretic especially given persistent bowel issue and concern over intravascular volume depletion.        Hypertension    Blood pressure well controlled.  Follow.        Renal insufficiency    Found on recent labs.  Will recheck.  Stay  hydrated.        Relevant Orders   Basic metabolic panel   Thrombophlebitis    Exam appears to be c/w thrombophlebitis.  alleve tonight and tomorrow.  Gentle use.  Has seen Dr Ronalee Belts previously.  Refer back for evaluation.  Will obtain lower extremity duplex today to confirm no DVT - given exam findings.  Keep legs elevated.        Relevant Orders   Ambulatory referral to Vascular Surgery  Other Visit Diagnoses    Leg swelling    -  Primary    Relevant Orders    Lower Extremity Venous Duplex Left    Ambulatory referral to Vascular Surgery    Redness        Relevant Orders    Lower Extremity Venous Duplex Left    Ambulatory referral to Vascular Surgery      I spent 25 minutes with the patient and more than 50% of the time was spent in consultation regarding the above.     ADDENDUM.  Radiology called back and pts lower extremity ultrasound was negative for DVT.  Pt informed.    Einar Pheasant, MD

## 2014-05-06 NOTE — Assessment & Plan Note (Signed)
Blood pressure well controlled.  Follow.

## 2014-05-09 ENCOUNTER — Ambulatory Visit (INDEPENDENT_AMBULATORY_CARE_PROVIDER_SITE_OTHER): Payer: Medicare Other | Admitting: Internal Medicine

## 2014-05-09 ENCOUNTER — Telehealth: Payer: Self-pay | Admitting: Internal Medicine

## 2014-05-09 ENCOUNTER — Ambulatory Visit: Payer: Self-pay | Admitting: Internal Medicine

## 2014-05-09 ENCOUNTER — Other Ambulatory Visit: Payer: Self-pay | Admitting: Internal Medicine

## 2014-05-09 ENCOUNTER — Encounter: Payer: Self-pay | Admitting: Internal Medicine

## 2014-05-09 VITALS — BP 120/60 | HR 67 | Temp 98.6°F | Resp 14 | Ht 63.0 in | Wt 157.8 lb

## 2014-05-09 DIAGNOSIS — Z79899 Other long term (current) drug therapy: Secondary | ICD-10-CM | POA: Diagnosis not present

## 2014-05-09 DIAGNOSIS — J189 Pneumonia, unspecified organism: Secondary | ICD-10-CM

## 2014-05-09 DIAGNOSIS — R0602 Shortness of breath: Secondary | ICD-10-CM | POA: Diagnosis not present

## 2014-05-09 DIAGNOSIS — R0600 Dyspnea, unspecified: Secondary | ICD-10-CM

## 2014-05-09 DIAGNOSIS — D649 Anemia, unspecified: Secondary | ICD-10-CM

## 2014-05-09 MED ORDER — FUROSEMIDE 20 MG PO TABS: 20.0000 mg | ORAL_TABLET | Freq: Two times a day (BID) | ORAL | Status: DC

## 2014-05-09 NOTE — Assessment & Plan Note (Signed)
Her exam is normal. But she has rapid wt gain and other symptoms suggestive of volume overload .  Labs, CXR ordered,  Furosemide 20 mg bid. If no change in 48 hours,  CT chst and cardiolgy evaluation,  Instructed to go to ER if symptoms worsen

## 2014-05-09 NOTE — Telephone Encounter (Signed)
FYI: Patient on Dr. Lupita Dawn schedule today.

## 2014-05-09 NOTE — Patient Instructions (Addendum)
Start the furosemide tonight,  You can take it two times daily in the morning and in the early afternoon ,  Until your weight starts to return to normal  If your workup suggests heart failure  We will send you to cardiology for further evaluation   If you are not better by Wednesday, call the office   If your shortness of breath gets worse,  If if you develop chest pain,  Call 911 or go to the ER

## 2014-05-09 NOTE — Progress Notes (Signed)
[name], female   DOB: 1928/09/30, 81 y.o.   MRN: [id number] Patient Active Problem List   Diagnosis Date Noted  . Dyspnea 05/09/2014  . Thrombophlebitis 05/06/2014  . Cellulitis 05/06/2014  . Cellulitis of leg, left 05/06/2014  . Diarrhea 05/06/2014  . Edema 05/06/2014  . Renal insufficiency 05/06/2014  . Palpitations 04/17/2014  . Health care maintenance 04/17/2014  . Toenail fungus 09/12/2013  . Shingles, right forehead 04/19/2013  . Temporal pain 04/15/2013  . Sinusitis 02/28/2013  . Headache(784.0) 12/31/2012  . Unspecified gastritis and gastroduodenitis without mention of hemorrhage 11/04/2012  . GERD (gastroesophageal reflux disease) 10/18/2012  . Autoimmune hepatitis 01/20/2012  . Osteopenia 01/20/2012  . Hypertension 01/16/2012  . Hypercholesterolemia 01/16/2012  . Hypothyroidism 04/30/2011  . Thyroiditis, lymphocytic 04/30/2011    Subjective:  CC:   Chief Complaint  Patient presents with  . Shortness of Breath    still short of breath but not as bad as this morning.  . Wheezing    Never had any wheezing,     HPI:   Ruth Roth is a 81 y.o. female who presents for  New onset exertional dyspnea accompanied by weight gain of 6 lbs since last week and relative hypoxemia.  Patient was treated for phlebitis last week by PCP with keflfex and probiotic.  Ultrasound done at [facility] was negative for DVT in the leg veins but showed superficial clot in a varicose vein in upper medial calf .   Patient denies orthopnea but sleeps habitually on her right side  Denies chest pain , cough and jaw pain.    Has been having diarrhea for the past 3 weeks ,  Was  Treated in ER with IV fluids and potassium oral supplementaiton about two weeks ago, then referred to [facility] for workup  .  No recent new medications,  No history of CAD. Stress ECHO/ECG Oct 2014 done to evaluate episode of chest pain was normal.  No WMA, and EF > 60%       Past Medical History   Diagnosis Date  . Hypertension   . Hypercholesterolemia   . 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 Fibrocystic breast disease   . Osteoarthritis   . Inflammatory arthritis   . Autoimmune hepatitis     followed by Dr [name]  . Neuropathy   . History of colon polyps 2000    Past Surgical History  Procedure Laterality Date  . Appendectomy    . Billroth    . Colonoscopy  2009  . Cholecystectomy  1998  . Upper gi endoscopy  2004  . Abdominal hysterectomy  1963    partial, secondary to fibroids       The following portions of the patient's history were reviewed and updated as appropriate: Allergies, current medications, and problem list.    Review of Systems:   Patient denies headache, fevers, malaise, unintentional weight loss, skin rash, eye pain, sinus congestion and sinus pain, sore throat, dysphagia,  hemoptysis , cough, dyspnea, wheezing, chest pain, palpitations, orthopnea, edema, abdominal pain, nausea, melena, diarrhea, constipation, flank pain, dysuria, hematuria, urinary  Frequency, nocturia, numbness, tingling, seizures,  Focal weakness, Loss of consciousness,  Tremor, insomnia, depression, anxiety, and suicidal ideation.     History   Social History  . Marital Status: Widowed    Spouse Name: N/A  . Number of Children: N/A  . Years of Education: N/A   Occupational  History  . Not on file.   Social History Main Topics  . Smoking status: Never Smoker   . Smokeless tobacco: Never Used  . Alcohol Use: No  . Drug Use: No  . Sexual Activity: Not on file   Other Topics Concern  . Not on file   Social History Narrative    Objective:  Filed Vitals:   05/09/14 1641  BP: 120/60  Pulse: 67  Temp: 98.6 F (37 C)  Resp: 14     General appearance: alert, cooperative and appears stated age Ears: normal TM's and external ear canals both ears Throat: lips, mucosa, and tongue normal; teeth and  gums normal Neck: no adenopathy, no carotid bruit, supple, symmetrical, trachea midline and thyroid not enlarged, symmetric, no tenderness/mass/nodules Back: symmetric, no curvature. ROM normal. No CVA tenderness. Lungs: clear to auscultation bilaterally Heart: regular rate and rhythm, S1, S2 normal, no murmur, click, rub or gallop Abdomen: soft, non-tender; bowel sounds normal; no masses,  no organomegaly Pulses: 2+ and symmetric Skin: Skin color, texture, turgor normal. Left medial calf superficial phlebitis.  No rashes or lesions Lymph nodes: Cervical, supraclavicular, and axillary nodes normal.  Assessment and Plan:  Dyspnea Her exam is normal. But she has rapid wt gain and other symptoms suggestive of volume overload .  Labs, CXR ordered,  Furosemide 20 mg bid. If no change in 48 hours,  CT chst and cardiolgy evaluation,  Instructed to go to ER if symptoms worsen     Updated Medication List Outpatient Encounter Prescriptions as of 05/09/2014  Medication Sig  . amLODipine (NORVASC) 10 MG tablet TAKE ONE TABLET BY MOUTH EVERY DAY  . aspirin 81 MG tablet Take 81 mg by mouth daily.    . cephALEXin (KEFLEX) 500 MG capsule Take 1 capsule (500 mg total) by mouth 3 (three) times daily.  Marland Kitchen gabapentin (NEURONTIN) 300 MG capsule TAKE 2 CAPSULES BY MOUTH FOUR TIMES DAILY.  Marland Kitchen levothyroxine (SYNTHROID, LEVOTHROID) 112 MCG tablet TAKE 1 TABLET BY MOUTH EVERY DAY BEFORE BREAKFAST  . metoprolol (LOPRESSOR) 50 MG tablet Take 1 tablet (50 mg total) by mouth 2 (two) times daily.  Marland Kitchen omeprazole (PRILOSEC) 20 MG capsule Take 1 capsule (20 mg total) by mouth 2 (two) times daily.  . Probiotic Product (ALIGN) 4 MG CAPS Take 1 capsule by mouth daily.  . traMADol (ULTRAM) 50 MG tablet One daily prn  . ursodiol (ACTIGALL) 300 MG capsule Take 300 mg by mouth. 2 morning - 1 night   . amitriptyline (ELAVIL) 10 MG tablet Take 2 tablets (20 mg total) by mouth at bedtime. (Patient not taking: Reported on 05/09/2014)   . furosemide (LASIX) 20 MG tablet Take 1 tablet (20 mg total) by mouth 2 (two) times daily.     Orders Placed This Encounter  Procedures  . DG Chest 2 View  . B Nat Peptide  . Comp Met (CMET)  . Prealbumin  . Troponin I  . CK total and CKMB (cardiac)    No Follow-up on file.

## 2014-05-09 NOTE — Progress Notes (Signed)
Order placed for f/u labs.  

## 2014-05-09 NOTE — Telephone Encounter (Signed)
FYI: Appt schedule with Dr. Derrel Nip today.

## 2014-05-09 NOTE — Telephone Encounter (Signed)
Aware. 

## 2014-05-09 NOTE — Telephone Encounter (Signed)
[facility]  Patient Name: Ruth Roth  Gender: Female  DOB: October 03, 1928   Age: 81 Y 11 M 9 D  Return Phone Number: [phone] (Primary)  Address: [place]   City/State/Zip: [place] [address]   Client [facility] [facility]  Client Site [place] [facility]  Physician Nicki Reaper, [place]   Contact Type Call  Call Type Triage / Clinical     Relationship To Patient Self     Return Phone Number [phone] (Primary)  Chief Complaint BREATHING - fast, heavy or wheezing  Initial Comment Caller states she started wheezing last night. This morning not breathing well.      PreDisposition Call Doctor         Nurse Assessment  Nurse: Shawn Stall, RN, Trish Date/Time Eilene Ghazi Time): 05/09/2014 10:55:11 AM  Confirm and document reason for call. If symptomatic, describe symptoms. ---Patient is calling for self and caller states she started wheezing last night. This morning not breathing well. Has been having diarrhea. Was seen last Thursday for illness and put on ABX 1 diarrhea today. cephalexin 500 mg. swelling in legs have gone down but is sob with activity.  Has the patient traveled out of the country within the last 30 days? ---No  Does the patient require triage? ---Yes  Related visit to physician within the last 2 weeks? ---Yes  Does the PT have any chronic conditions? (i.e. diabetes, asthma, etc.) ---Yes  List chronic conditions. ---Hypertension -3 days Einar Pheasant, MD Thrombophlebitis -3 days Einar Pheasant, MD Respiratory Sinusitis -1 yr Einar Pheasant, MD Digestive Autoimmune hepatitis -3 days Einar Pheasant, MD GERD (gastroesophageal reflux disease) -3 wk Einar Pheasant, MD Unspecified gastritis and gastroduodenitis without mention of hemorrhage -2 yr Zoe Lan, CMA Endocrine Hypothyroidism -3 wk Einar Pheasant, MD Thyroiditis, lymphocytic -8 mo Einar Pheasant, MD Musculoskeletal and Integument Osteopenia -8 mo Einar Pheasant, MD Toenail fungus -8 mo Einar Pheasant, MD Genitourinary Renal insufficiency -3 days Einar Pheasant, MD Other Hypercholesterolemia -3 wk Einar Pheasant, MD [id number]) -1 yr Einar Pheasant, MD Temporal pain -1 yr Rey, Latina Craver, NP Shingles, right forehead -8 mo Einar Pheasant, MD Palpitations -3 wk Einar Pheasant, MD Health care maintenance -3 wk Einar Pheasant, MD Cellulitis -3 days Einar Pheasant, MD Cellulitis of leg, left -3 days Einar Pheasant, MD Diarrhea -3 days Einar Pheasant, MD Edema -3 days Einar Pheasant, MD     Guidelines      Guideline Title Affirmed Question Affirmed Notes Nurse Date/Time Eilene Ghazi Time)  Breathing Difficulty [1] MILD difficulty breathing (e.g., minimal/no SOB at rest, SOB with walking, pulse <100) AND [2] NEW-onset or WORSE than normal  [place], RN, Trish 05/09/2014 11:00:00 AM   Disp. Time Eilene Ghazi Time) Disposition Final User   05/09/2014 10:50:06 AM Send to Urgent Queue  Caryl Comes    05/09/2014 11:01:07 AM See Physician within 4 Hours (or PCP triage) Yes Shawn Stall, RN, Trish        Caller Understands: Yes  Disagree/Comply: Comply     Care Advice Given Per Guideline      SEE PHYSICIAN WITHIN 4 HOURS (or PCP triage): CALL BACK IF: * You become worse. CARE ADVICE given per Breathing Difficulty (Adult) guideline.   After Care Instructions Given     Call Event Type User Date / Time Description        Referrals  REFERRED TO PCP  OFFICE

## 2014-05-10 ENCOUNTER — Telehealth: Payer: Self-pay | Admitting: Internal Medicine

## 2014-05-10 ENCOUNTER — Inpatient Hospital Stay: Payer: Self-pay | Admitting: Internal Medicine

## 2014-05-10 DIAGNOSIS — R0602 Shortness of breath: Secondary | ICD-10-CM | POA: Diagnosis not present

## 2014-05-10 DIAGNOSIS — G629 Polyneuropathy, unspecified: Secondary | ICD-10-CM | POA: Diagnosis not present

## 2014-05-10 DIAGNOSIS — I10 Essential (primary) hypertension: Secondary | ICD-10-CM | POA: Diagnosis not present

## 2014-05-10 DIAGNOSIS — Z79899 Other long term (current) drug therapy: Secondary | ICD-10-CM | POA: Diagnosis not present

## 2014-05-10 DIAGNOSIS — I509 Heart failure, unspecified: Secondary | ICD-10-CM | POA: Diagnosis not present

## 2014-05-10 DIAGNOSIS — K279 Peptic ulcer, site unspecified, unspecified as acute or chronic, without hemorrhage or perforation: Secondary | ICD-10-CM | POA: Diagnosis present

## 2014-05-10 DIAGNOSIS — N649 Disorder of breast, unspecified: Secondary | ICD-10-CM | POA: Diagnosis present

## 2014-05-10 DIAGNOSIS — I5033 Acute on chronic diastolic (congestive) heart failure: Secondary | ICD-10-CM | POA: Diagnosis present

## 2014-05-10 DIAGNOSIS — Z7982 Long term (current) use of aspirin: Secondary | ICD-10-CM | POA: Diagnosis not present

## 2014-05-10 DIAGNOSIS — Z801 Family history of malignant neoplasm of trachea, bronchus and lung: Secondary | ICD-10-CM | POA: Diagnosis not present

## 2014-05-10 DIAGNOSIS — I340 Nonrheumatic mitral (valve) insufficiency: Secondary | ICD-10-CM | POA: Diagnosis not present

## 2014-05-10 DIAGNOSIS — K754 Autoimmune hepatitis: Secondary | ICD-10-CM | POA: Diagnosis not present

## 2014-05-10 DIAGNOSIS — N6019 Diffuse cystic mastopathy of unspecified breast: Secondary | ICD-10-CM | POA: Diagnosis present

## 2014-05-10 DIAGNOSIS — R0600 Dyspnea, unspecified: Secondary | ICD-10-CM

## 2014-05-10 DIAGNOSIS — E876 Hypokalemia: Secondary | ICD-10-CM | POA: Diagnosis not present

## 2014-05-10 DIAGNOSIS — M064 Inflammatory polyarthropathy: Secondary | ICD-10-CM | POA: Diagnosis present

## 2014-05-10 DIAGNOSIS — I214 Non-ST elevation (NSTEMI) myocardial infarction: Secondary | ICD-10-CM | POA: Diagnosis not present

## 2014-05-10 DIAGNOSIS — I2510 Atherosclerotic heart disease of native coronary artery without angina pectoris: Secondary | ICD-10-CM | POA: Diagnosis not present

## 2014-05-10 DIAGNOSIS — E039 Hypothyroidism, unspecified: Secondary | ICD-10-CM | POA: Diagnosis not present

## 2014-05-10 LAB — PREALBUMIN: Prealbumin: 21.5 mg/dL (ref 17.0–34.0)

## 2014-05-10 LAB — COMPREHENSIVE METABOLIC PANEL
ALT: 20 U/L (ref 0–35)
AST: 18 U/L (ref 0–37)
Albumin: 3.4 g/dL — ABNORMAL LOW (ref 3.5–5.2)
Alkaline Phosphatase: 51 U/L (ref 39–117)
BUN: 14 mg/dL (ref 6–23)
CHLORIDE: 110 meq/L (ref 96–112)
CO2: 21 meq/L (ref 19–32)
Calcium: 8.5 mg/dL (ref 8.4–10.5)
Creatinine, Ser: 1.09 mg/dL (ref 0.40–1.20)
GFR: 50.83 mL/min — ABNORMAL LOW (ref >60.00)
Glucose, Bld: 114 mg/dL — ABNORMAL HIGH (ref 70–99)
POTASSIUM: 3.5 meq/L (ref 3.5–5.1)
SODIUM: 140 meq/L (ref 135–145)
TOTAL PROTEIN: 6.5 g/dL (ref 6.0–8.3)
Total Bilirubin: 0.3 mg/dL (ref 0.2–1.2)

## 2014-05-10 LAB — PROTIME-INR: INR: 0.9 (ref 0.9–1.1)

## 2014-05-10 LAB — BRAIN NATRIURETIC PEPTIDE: Pro B Natriuretic peptide (BNP): 569 pg/mL — ABNORMAL HIGH (ref 0.0–100.0)

## 2014-05-10 LAB — TROPONIN I: TNIDX: 0.01 ug/l (ref 0.00–0.06)

## 2014-05-10 MED ORDER — LEVOFLOXACIN 500 MG PO TABS: 500.0000 mg | ORAL_TABLET | Freq: Every day | ORAL | Status: DC

## 2014-05-10 MED ORDER — PREDNISONE 10 MG PO TABS: ORAL_TABLET | ORAL | Status: DC

## 2014-05-10 MED ORDER — ALBUTEROL SULFATE HFA 108 (90 BASE) MCG/ACT IN AERS: 2.0000 | INHALATION_SPRAY | Freq: Four times a day (QID) | RESPIRATORY_TRACT | Status: DC | PRN

## 2014-05-10 NOTE — Assessment & Plan Note (Signed)
patinet still short of breath despite two doses of lasix,  Now now c/o  CVA pain on  Right side.  CXR shows small pleural effusion on right,  Bibasilar ATX and no interstitial edema. Symptoms concerning for impending resp failure. patnet advised to go to ER for evaluation

## 2014-05-10 NOTE — Telephone Encounter (Signed)
Patient is calling back with bilateral leg swelling and left sided pain at Kidney area, sob of breathe on exertion.

## 2014-05-10 NOTE — Telephone Encounter (Signed)
OK, then ,  Levaquin, predniosne taper and albuterol MDI called in.  Please arrangae a follow up on Thursday or Friday with me or Dr Nicki Reaper her PCP

## 2014-05-10 NOTE — Telephone Encounter (Signed)
Returned call to patient as advised by MD verbally and advised patient to go to ER now , patient agreed and daughter is driving patient faxed office notes and copy report of X-ray to triage Nurse at ER. FYI

## 2014-05-10 NOTE — Addendum Note (Signed)
Addended by: Crecencio Mc on: 05/10/2014 11:38 AM   Modules accepted: Orders

## 2014-05-10 NOTE — Assessment & Plan Note (Signed)
patinet declined to go to ER,  Stated that dyspnea is imroving.  Abx, prednisone and albuterol called in .

## 2014-05-10 NOTE — Telephone Encounter (Signed)
See phone note

## 2014-05-10 NOTE — Telephone Encounter (Signed)
-----   Message from Nanci Pina, LPN sent at [id number] 10:18 AM EST ----- Patient stated the SOB is not as bad now that she is up and moving around ad would like the ABX called in and prednisone . Patient also concerned that she is hurting on the left side at the area of her kidney stated by patient. Please advise

## 2014-05-10 NOTE — Telephone Encounter (Signed)
Patient seen at ER

## 2014-05-11 DIAGNOSIS — I214 Non-ST elevation (NSTEMI) myocardial infarction: Secondary | ICD-10-CM

## 2014-05-11 DIAGNOSIS — I509 Heart failure, unspecified: Secondary | ICD-10-CM

## 2014-05-11 LAB — CK TOTAL AND CKMB (NOT AT ARMC): Total CK: 122 U/L (ref 7–177)

## 2014-05-12 ENCOUNTER — Ambulatory Visit: Payer: Medicare Other | Admitting: Cardiovascular Disease

## 2014-05-12 ENCOUNTER — Other Ambulatory Visit: Payer: Medicare Other

## 2014-05-12 ENCOUNTER — Telehealth: Payer: Self-pay

## 2014-05-12 DIAGNOSIS — I2510 Atherosclerotic heart disease of native coronary artery without angina pectoris: Secondary | ICD-10-CM

## 2014-05-12 HISTORY — PX: CARDIAC CATHETERIZATION: SHX172

## 2014-05-12 NOTE — Telephone Encounter (Signed)
Will review records and when available and see how she is doing.  I can see her on 05/24/14 at 9:00 - block the 30 min slot (if ok to wait).  Also can hold and see if get cancellation next week.

## 2014-05-12 NOTE — Telephone Encounter (Signed)
Pt scheduled for HFU with [facility] on Wed. 3/9 @ 1pm. Will request hospital records on Friday 05/13/14.

## 2014-05-12 NOTE — Telephone Encounter (Signed)
The patient is being discharged from [facility] today and has scheduled her hosp follow up

## 2014-05-13 ENCOUNTER — Telehealth: Payer: Self-pay

## 2014-05-13 NOTE — Telephone Encounter (Signed)
The patient's daughter called and stated the hospital cardiologist wants the patient to begin a statin drug to lower her cholesterol.  The daughter is nervous the medication will mess up the patient's liver.  She is hoping to get advice on if she should take this medication.   Daughter's callback - [phone]

## 2014-05-13 NOTE — Telephone Encounter (Signed)
Pt notified of hospital follow-up appointment

## 2014-05-13 NOTE — Telephone Encounter (Signed)
Pt.notified

## 2014-05-13 NOTE — Telephone Encounter (Signed)
I moved patient to Dr. Nicki Reaper for a HFU on Tuesday 05/17/14 @ 3pm. **Pt needs to be notified of appt during TCM call**

## 2014-05-13 NOTE — Telephone Encounter (Signed)
I would recommend them calling GI - with her liver issue and clearing with them regarding taking the cholesterol medication.

## 2014-05-16 ENCOUNTER — Encounter: Payer: Self-pay | Admitting: Cardiovascular Disease

## 2014-05-16 DIAGNOSIS — E039 Hypothyroidism, unspecified: Secondary | ICD-10-CM | POA: Diagnosis not present

## 2014-05-16 NOTE — Telephone Encounter (Signed)
Left vm for pt to return my call.  

## 2014-05-17 ENCOUNTER — Ambulatory Visit (INDEPENDENT_AMBULATORY_CARE_PROVIDER_SITE_OTHER): Payer: Medicare Other | Admitting: Internal Medicine

## 2014-05-17 ENCOUNTER — Encounter: Payer: Self-pay | Admitting: Internal Medicine

## 2014-05-17 VITALS — BP 120/60 | HR 65 | Temp 98.5°F | Ht 63.0 in | Wt 145.2 lb

## 2014-05-17 DIAGNOSIS — I2510 Atherosclerotic heart disease of native coronary artery without angina pectoris: Secondary | ICD-10-CM | POA: Diagnosis not present

## 2014-05-17 DIAGNOSIS — E7800 Pure hypercholesterolemia, unspecified: Secondary | ICD-10-CM

## 2014-05-17 DIAGNOSIS — E063 Autoimmune thyroiditis: Secondary | ICD-10-CM

## 2014-05-17 DIAGNOSIS — K754 Autoimmune hepatitis: Secondary | ICD-10-CM

## 2014-05-17 DIAGNOSIS — D649 Anemia, unspecified: Secondary | ICD-10-CM

## 2014-05-17 DIAGNOSIS — I10 Essential (primary) hypertension: Secondary | ICD-10-CM | POA: Diagnosis not present

## 2014-05-17 DIAGNOSIS — I809 Phlebitis and thrombophlebitis of unspecified site: Secondary | ICD-10-CM

## 2014-05-17 DIAGNOSIS — R609 Edema, unspecified: Secondary | ICD-10-CM | POA: Diagnosis not present

## 2014-05-17 DIAGNOSIS — R197 Diarrhea, unspecified: Secondary | ICD-10-CM | POA: Diagnosis not present

## 2014-05-17 DIAGNOSIS — N289 Disorder of kidney and ureter, unspecified: Secondary | ICD-10-CM

## 2014-05-17 DIAGNOSIS — K219 Gastro-esophageal reflux disease without esophagitis: Secondary | ICD-10-CM

## 2014-05-17 DIAGNOSIS — L03116 Cellulitis of left lower limb: Secondary | ICD-10-CM

## 2014-05-17 NOTE — Progress Notes (Signed)
[name], female   DOB: [birth year], 81 y.o.   MRN: [id number]   Subjective:    [name], female    DOB: 08/30/1928, 81 y.o.   MRN: [id number]  HPI  Patient here for a hospital follow up.   She was admitted on 05/10/14 with non ST elevation myocardial infarction.  Had heart catheterization.  Revealed a 60% lesion - left cfx.  Elected Conservation officer, [employer].  She denies any chest pain or sob.  Denies back pain.  Leg swelling is better.  Taking lasix.  No lower extremity redness.  Diarrhea better.  Due to f/u with Dr Fletcher Anon next week.  Due to f/u with Dr [name] this week.     Past Medical History  Diagnosis Date  . Hypertension   . Hypercholesterolemia   . 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 Fibrocystic breast disease   . Osteoarthritis   . Inflammatory arthritis   . Autoimmune hepatitis     followed by Dr [name]  . Neuropathy   . History of colon polyps 2000    Current Outpatient Prescriptions on File Prior to Visit  Medication Sig Dispense Refill  . amLODipine (NORVASC) 10 MG tablet TAKE ONE TABLET BY MOUTH EVERY DAY 90 tablet 1  . aspirin 81 MG tablet Take 81 mg by mouth daily.      . furosemide (LASIX) 20 MG tablet TAKE 1 TABLET BY MOUTH TWICE DAILY (Patient taking differently: TAKE 1 TABLET BY MOUTH ONCE A  DAILY) 180 tablet 2  . gabapentin (NEURONTIN) 300 MG capsule TAKE 2 CAPSULES BY MOUTH FOUR TIMES DAILY. 720 capsule 1  . levothyroxine (SYNTHROID, LEVOTHROID) 112 MCG tablet TAKE 1 TABLET BY MOUTH EVERY DAY BEFORE BREAKFAST 90 tablet 3  . metoprolol (LOPRESSOR) 50 MG tablet Take 1 tablet (50 mg total) by mouth 2 (two) times daily. 180 tablet 1  . omeprazole (PRILOSEC) 20 MG capsule Take 1 capsule (20 mg total) by mouth 2 (two) times daily. 180 capsule 3  . Probiotic Product (ALIGN) 4 MG CAPS Take 1 capsule by mouth daily.    . traMADol (ULTRAM) 50 MG tablet One daily prn 60 tablet 1  .  ursodiol (ACTIGALL) 300 MG capsule Take 300 mg by mouth. 2 morning - 1 night      No current facility-administered medications on file prior to visit.    Review of Systems  Constitutional: Negative for fever, chills and appetite change.  HENT: Negative for congestion and sinus pressure.   Respiratory: Negative for cough, chest tightness and shortness of breath.   Cardiovascular: Positive for leg swelling (leg swelling imrpoved.  no redness). Negative for chest pain and palpitations.  Gastrointestinal: Positive for diarrhea (diarrhea improved.  ). Negative for nausea, vomiting and abdominal pain.  Genitourinary: Negative for dysuria and difficulty urinating.  Musculoskeletal: Negative for back pain and joint swelling.  Skin: Negative for rash.       No redness  Neurological: Negative for dizziness, light-headedness and headaches.  Psychiatric/Behavioral: Negative for dysphoric mood and agitation.       Objective:    Physical Exam  Constitutional: She appears well-developed and well-nourished. No distress.  HENT:  Nose: Nose normal.  Mouth/Throat: Oropharynx is clear and moist.  Neck: Neck supple. No thyromegaly present.  Cardiovascular: Normal rate and regular rhythm.   Pulmonary/Chest: Breath sounds normal. No respiratory distress. She has no wheezes.  Abdominal: Soft. Bowel  sounds are normal. There is no tenderness.  Musculoskeletal: She exhibits edema (swelling improved). She exhibits no tenderness.  Lymphadenopathy:    She has no cervical adenopathy.  Skin: No rash noted. No erythema.    BP 120/60 mmHg  Pulse 65  Temp(Src) 98.5 F (36.9 C) (Oral)  Ht 5' 3"  (1.6 m)  Wt 145 lb 4 oz (65.885 kg)  BMI 25.74 kg/m2  SpO2 95% Wt Readings from Last 3 Encounters:  05/17/14 145 lb 4 oz (65.885 kg)  05/09/14 157 lb 12 oz (71.555 kg)  05/05/14 154 lb (69.854 kg)     Lab Results  Component Value Date   WBC 5.5 05/17/2014   HGB 11.6* 05/17/2014   HCT 35.1* 05/17/2014    PLT 300.0 05/17/2014   GLUCOSE 87 05/17/2014   CHOL 201* 04/12/2014   TRIG 138.0 04/12/2014   HDL 60.90 04/12/2014   LDLDIRECT 143.5 12/17/2012   LDLCALC 113* 04/12/2014   ALT 20 05/09/2014   AST 18 05/09/2014   NA 137 05/17/2014   K 5.3* 05/17/2014   CL 102 05/17/2014   CREATININE 1.08 05/17/2014   BUN 21 05/17/2014   CO2 28 05/17/2014   TSH 1.95 04/12/2014   INR 0.9 09/02/2012    US Soft Tissue Head/neck  04/22/2013   CLINICAL DATA:  Thyroiditis.  EXAM: THYROID ULTRASOUND  TECHNIQUE: Ultrasound examination of the thyroid gland and adjacent soft tissues was performed.  COMPARISON:  09/22/2006  FINDINGS: Right thyroid lobe  Measurements: 55 x 25 x 17 mm. Moderately inhomogeneous echotexture without focal lesion.  Left thyroid lobe  Measurements: 43 x 20 x 15 mm. Inhomogeneous echotexture, hyperemic, without focal nodule.  Isthmus  Thickness: 9.2 mm.  No nodules visualized.  Lymphadenopathy  None visualized.  IMPRESSION: Thyromegaly with inhomogeneous parenchyma, no dominant nodule or mass.   Electronically Signed   By: Arne Cleveland M.D.   On: 04/22/2013 14:16       Assessment & Plan:   Problem List Items Addressed This Visit    Autoimmune hepatitis    Followed by GI.  Planning to discuss with Dr [name] regarding starting a statin.  Has had liver elevation in the past with statin therapy.        CAD (coronary artery disease)    Recent admission with non ST elevation MI.  Heart cath revealed 60% left cfx lesion.  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  Feels better.        Relevant Orders   Basic metabolic panel (Completed)   Cellulitis of leg, left    Redness better.  Swelling better.  Follow.        Diarrhea    Improved.  Follow. Keep f/u with Dr [name].        Edema    Swelling has improved.  On lasix.  Follow.  Check met b.       GERD (gastroesophageal reflux disease)    Symptoms controlled.        Hypercholesterolemia    Low  cholesterol diet and exercise.  Follow lipid panel.  Plans to discuss with Dr [name] regarding starting statin therapy.        Hypertension    Blood pressure under good control.  Same medication regimen.  Follow pressures.  Check metabolic panel.        Renal insufficiency    Recheck metabolic panel.        Thrombophlebitis    Swelling and redness resolved.  Thyroiditis, lymphocytic    Just saw Dr Harlow Asa.  Stable.  See his note for details.         Other Visit Diagnoses    Anemia, unspecified anemia type    -  Primary    Relevant Orders    CBC with Differential/Platelet (Completed)      I spent 25 minutes with the patient and more than 50% of the time was spent in consultation regarding the above.     Einar Pheasant, MD

## 2014-05-17 NOTE — Progress Notes (Signed)
Pre visit review using our clinic review tool, if applicable. No additional management support is needed unless otherwise documented below in the visit note. 

## 2014-05-17 NOTE — Telephone Encounter (Signed)
Pt seen today, TCM call not accomplished

## 2014-05-18 ENCOUNTER — Ambulatory Visit: Payer: Medicare Other | Admitting: Nurse Practitioner

## 2014-05-18 ENCOUNTER — Telehealth: Payer: Self-pay

## 2014-05-18 LAB — CBC WITH DIFFERENTIAL/PLATELET
Basophils Absolute: 0 K/uL (ref 0.0–0.1)
Basophils Relative: 0.3 % (ref 0.0–3.0)
Eosinophils Absolute: 0.2 K/uL (ref 0.0–0.7)
Eosinophils Relative: 4.5 % (ref 0.0–5.0)
HCT: 35.1 % — ABNORMAL LOW (ref 36.0–46.0)
Hemoglobin: 11.6 g/dL — ABNORMAL LOW (ref 12.0–15.0)
Lymphocytes Relative: 37.9 % (ref 12.0–46.0)
Lymphs Abs: 2.1 K/uL (ref 0.7–4.0)
MCHC: 33.1 g/dL (ref 30.0–36.0)
MCV: 95.2 fl (ref 78.0–100.0)
Monocytes Absolute: 0.6 K/uL (ref 0.1–1.0)
Monocytes Relative: 11.2 % (ref 3.0–12.0)
Neutro Abs: 2.5 K/uL (ref 1.4–7.7)
Neutrophils Relative %: 46.1 % (ref 43.0–77.0)
Platelets: 300 K/uL (ref 150.0–400.0)
RBC: 3.69 Mil/uL — ABNORMAL LOW (ref 3.87–5.11)
RDW: 14.2 % (ref 11.5–15.5)
WBC: 5.5 K/uL (ref 4.0–10.5)

## 2014-05-18 LAB — BASIC METABOLIC PANEL WITH GFR
BUN: 21 mg/dL (ref 6–23)
CO2: 28 meq/L (ref 19–32)
Calcium: 9.2 mg/dL (ref 8.4–10.5)
Chloride: 102 meq/L (ref 96–112)
Creatinine, Ser: 1.08 mg/dL (ref 0.40–1.20)
GFR: 51.37 mL/min — ABNORMAL LOW
Glucose, Bld: 87 mg/dL (ref 70–99)
Potassium: 5.3 meq/L — ABNORMAL HIGH (ref 3.5–5.1)
Sodium: 137 meq/L (ref 135–145)

## 2014-05-18 NOTE — Telephone Encounter (Signed)
Patient contacted regarding discharge from [facility] on 05/12/14.  Patient understands to follow up with Dr. Fletcher Anon on 05/24/14 at 3:15 at [facility]. Patient understands discharge instructions? yes Patient understands medications and regiment? yes Patient understands to bring all medications to this visit? yes

## 2014-05-19 ENCOUNTER — Other Ambulatory Visit: Payer: Self-pay | Admitting: Internal Medicine

## 2014-05-19 DIAGNOSIS — E875 Hyperkalemia: Secondary | ICD-10-CM

## 2014-05-19 DIAGNOSIS — K529 Noninfective gastroenteritis and colitis, unspecified: Secondary | ICD-10-CM | POA: Diagnosis not present

## 2014-05-19 DIAGNOSIS — K743 Primary biliary cirrhosis: Secondary | ICD-10-CM | POA: Diagnosis not present

## 2014-05-19 NOTE — Progress Notes (Signed)
Order placed for f/u potassium.  

## 2014-05-22 ENCOUNTER — Encounter: Payer: Self-pay | Admitting: Internal Medicine

## 2014-05-22 NOTE — Assessment & Plan Note (Signed)
Improved.  Follow. Keep f/u with Dr [name].

## 2014-05-22 NOTE — Assessment & Plan Note (Signed)
Recent admission with non ST elevation MI.  Heart cath revealed 60% left cfx lesion.  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  Feels better.

## 2014-05-22 NOTE — Assessment & Plan Note (Signed)
Recheck metabolic panel.   

## 2014-05-22 NOTE — Assessment & Plan Note (Signed)
Just saw Dr Harlow Asa.  Stable.  See his note for details.

## 2014-05-22 NOTE — Assessment & Plan Note (Signed)
Blood pressure under good control.  Same medication regimen.  Follow pressures.  Check metabolic panel.

## 2014-05-22 NOTE — Assessment & Plan Note (Signed)
Followed by GI.  Planning to discuss with Dr [name] regarding starting a statin.  Has had liver elevation in the past with statin therapy.

## 2014-05-22 NOTE — Assessment & Plan Note (Signed)
Redness better.  Swelling better.  Follow.

## 2014-05-22 NOTE — Assessment & Plan Note (Signed)
Low cholesterol diet and exercise.  Follow lipid panel.  Plans to discuss with Dr [name] regarding starting statin therapy.

## 2014-05-22 NOTE — Assessment & Plan Note (Signed)
Swelling and redness resolved.

## 2014-05-22 NOTE — Assessment & Plan Note (Signed)
Symptoms controlled

## 2014-05-22 NOTE — Assessment & Plan Note (Signed)
Swelling has improved.  On lasix.  Follow.  Check met b.

## 2014-05-24 ENCOUNTER — Encounter: Payer: Self-pay | Admitting: Cardiovascular Disease

## 2014-05-24 ENCOUNTER — Ambulatory Visit (INDEPENDENT_AMBULATORY_CARE_PROVIDER_SITE_OTHER): Payer: Medicare Other | Admitting: Cardiovascular Disease

## 2014-05-24 VITALS — BP 128/62 | HR 69 | Ht 60.0 in | Wt 143.8 lb

## 2014-05-24 DIAGNOSIS — I5032 Chronic diastolic (congestive) heart failure: Secondary | ICD-10-CM | POA: Diagnosis not present

## 2014-05-24 DIAGNOSIS — Z9889 Other specified postprocedural states: Secondary | ICD-10-CM

## 2014-05-24 DIAGNOSIS — I2510 Atherosclerotic heart disease of native coronary artery without angina pectoris: Secondary | ICD-10-CM | POA: Diagnosis not present

## 2014-05-24 DIAGNOSIS — E7800 Pure hypercholesterolemia, unspecified: Secondary | ICD-10-CM

## 2014-05-24 DIAGNOSIS — I10 Essential (primary) hypertension: Secondary | ICD-10-CM

## 2014-05-24 DIAGNOSIS — I5033 Acute on chronic diastolic (congestive) heart failure: Secondary | ICD-10-CM | POA: Diagnosis present

## 2014-05-24 DIAGNOSIS — I25118 Atherosclerotic heart disease of native coronary artery with other forms of angina pectoris: Secondary | ICD-10-CM | POA: Insufficient documentation

## 2014-05-24 NOTE — Progress Notes (Signed)
Primary care physician: Dr. Einar Pheasant  HPI  This is a pleasant 81 year old female who is here today for follow-up visit after recent hospitalization for non-ST elevation myocardial infarction. She has no history of hypertension, peptic ulcer disease, fibrocystic breast disease, inflammatory arthritis, autoimmune hepatitis and neuropathy. She presented recently to [facility] with shortness of breath and 12 pound weight gain after recent GI illness. She was noted to be fluid overloaded. BNP was elevated. Troponin was mildly elevated. She improved with diuresis. Peak troponin was 1.2. Echocardiogram showed normal LV systolic function. Cardiac catheterization showed showed 60% ostial left circumflex hazy stenosis which was possibly the culprit. Mild LAD/RCA disease. I recommended medical therapy. She has been doing significantly better with no recurrent chest pain or shortness of breath.  Allergies  Allergen Reactions  . Altace [Ramipril]   . Librax [Chlordiazepoxide-Clidinium]   . Statins     Affect her liver     Current Outpatient Prescriptions on File Prior to Visit  Medication Sig Dispense Refill  . amLODipine (NORVASC) 10 MG tablet TAKE ONE TABLET BY MOUTH EVERY DAY 90 tablet 1  . aspirin 81 MG tablet Take 81 mg by mouth daily.      . clopidogrel (PLAVIX) 75 MG tablet Take 75 mg by mouth daily.  0  . gabapentin (NEURONTIN) 300 MG capsule TAKE 2 CAPSULES BY MOUTH FOUR TIMES DAILY. 720 capsule 1  . levothyroxine (SYNTHROID, LEVOTHROID) 112 MCG tablet TAKE 1 TABLET BY MOUTH EVERY DAY BEFORE BREAKFAST 90 tablet 3  . metoprolol (LOPRESSOR) 50 MG tablet Take 1 tablet (50 mg total) by mouth 2 (two) times daily. 180 tablet 1  . omeprazole (PRILOSEC) 20 MG capsule Take 1 capsule (20 mg total) by mouth 2 (two) times daily. 180 capsule 3  . potassium chloride SA (K-DUR,KLOR-CON) 20 MEQ tablet Take 20 mEq by mouth daily.  0  . Probiotic Product (ALIGN) 4 MG CAPS Take 1 capsule by mouth daily.    .  traMADol (ULTRAM) 50 MG tablet One daily prn 60 tablet 1  . ursodiol (ACTIGALL) 300 MG capsule Take 300 mg by mouth. 2 morning - 1 night      No current facility-administered medications on file prior to visit.     Past Medical History  Diagnosis Date  . Hypertension   . 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 Fibrocystic breast disease   . Osteoarthritis   . Inflammatory arthritis   . Autoimmune hepatitis     followed by Dr [name]  . Neuropathy   . History of colon polyps 2000  . MI (myocardial infarction)   . Thyroid disease   . CHF (congestive heart failure)   . Coronary artery disease     Non-ST elevation myocardial infarction in March 2016. Cardiac catheterization showed 60% ostial left circumflex hazy stenosis which was possibly the culprit. Mild LAD/RCA disease. EF 60% by echo.  . Hypercholesterolemia      Past Surgical History  Procedure Laterality Date  . Appendectomy    . Billroth    . Colonoscopy  2009  . Cholecystectomy  1998  . Upper gi endoscopy  2004  . Abdominal hysterectomy  1963    partial, secondary to fibroids  . Cardiac catheterization  05/12/14    [facility]     Family History  Problem Relation Age of Onset  . Stroke Mother   . Esophageal cancer Brother     also had lung  cancer  . Breast cancer Neg Hx   . Colon cancer Neg Hx      History   Social History  . Marital Status: Widowed    Spouse Name: N/A  . Number of Children: N/A  . Years of Education: N/A   Occupational History  . Not on file.   Social History Main Topics  . Smoking status: Never Smoker   . Smokeless tobacco: Never Used  . Alcohol Use: No  . Drug Use: No  . Sexual Activity: Not on file   Other Topics Concern  . Not on file   Social History Narrative      PHYSICAL EXAM   BP 128/62 mmHg  Pulse 69  Ht 5' (1.524 m)  Wt 143 lb 12 oz (65.205 kg)  BMI 28.07 kg/m2 Constitutional: She is oriented to  person, place, and time. She appears well-developed and well-nourished. No distress.  HENT: No nasal discharge.  Head: Normocephalic and atraumatic.  Eyes: Pupils are equal and round. No discharge.  Neck: Normal range of motion. Neck supple. No JVD present. No thyromegaly present.  Cardiovascular: Normal rate, regular rhythm, normal heart sounds. Exam reveals no gallop and no friction rub. No murmur heard.  Pulmonary/Chest: Effort normal and breath sounds normal. No stridor. No respiratory distress. She has no wheezes. She has no rales. She exhibits no tenderness.  Abdominal: Soft. Bowel sounds are normal. She exhibits no distension. There is no tenderness. There is no rebound and no guarding.  Musculoskeletal: Normal range of motion. She exhibits no edema and no tenderness.  Neurological: She is alert and oriented to person, place, and time. Coordination normal.  Skin: Skin is warm and dry. No rash noted. She is not diaphoretic. No erythema. No pallor.  Psychiatric: She has a normal mood and affect. Her behavior is normal. Judgment and thought content normal.  Right radial pulse is normal with no hematoma   [id number]  Rhythm  Low voltage in limb leads.   ABNORMAL    ASSESSMENT AND PLAN

## 2014-05-24 NOTE — Patient Instructions (Signed)
Refer to cardiac rehab at [facility].   Continue same medications.   Follow up in 3 months.

## 2014-05-24 NOTE — Assessment & Plan Note (Signed)
She has known history of autoimmune hepatitis with previous reaction possibly to simvastatin. Due to that, the risks of treatment with a statin might outweigh the benefits in this situation.

## 2014-05-24 NOTE — Assessment & Plan Note (Signed)
She is doing reasonably well after her recent non-ST elevation myocardial infarction. Medical therapy was recommended as outlined above. I recommend continuing Plavix until March 2017 unleass there is a bleeding complication. I referred the patient to cardiac rehabilitation.

## 2014-05-24 NOTE — Assessment & Plan Note (Signed)
This happened recently in the setting of myocardial infarction and post GI viral illness. She appears to be euvolemic now on small dose furosemide. Ejection fraction was normal by echo.

## 2014-05-24 NOTE — Assessment & Plan Note (Signed)
Blood pressure is well controlled on current medication.

## 2014-05-26 ENCOUNTER — Other Ambulatory Visit (INDEPENDENT_AMBULATORY_CARE_PROVIDER_SITE_OTHER): Payer: Medicare Other

## 2014-05-26 DIAGNOSIS — E875 Hyperkalemia: Secondary | ICD-10-CM | POA: Diagnosis not present

## 2014-05-26 DIAGNOSIS — D649 Anemia, unspecified: Secondary | ICD-10-CM | POA: Diagnosis not present

## 2014-05-26 LAB — CBC WITH DIFFERENTIAL/PLATELET
BASOS ABS: 0 10*3/uL (ref 0.0–0.1)
BASOS PCT: 0.7 % (ref 0.0–3.0)
EOS ABS: 0.2 10*3/uL (ref 0.0–0.7)
Eosinophils Relative: 3.4 % (ref 0.0–5.0)
HCT: 35.5 % — ABNORMAL LOW (ref 36.0–46.0)
Hemoglobin: 11.7 g/dL — ABNORMAL LOW (ref 12.0–15.0)
LYMPHS PCT: 31 % (ref 12.0–46.0)
Lymphs Abs: 1.9 10*3/uL (ref 0.7–4.0)
MCHC: 33 g/dL (ref 30.0–36.0)
MCV: 95.3 fl (ref 78.0–100.0)
Monocytes Absolute: 0.5 10*3/uL (ref 0.1–1.0)
Monocytes Relative: 7.5 % (ref 3.0–12.0)
NEUTROS ABS: 3.6 10*3/uL (ref 1.4–7.7)
NEUTROS PCT: 57.4 % (ref 43.0–77.0)
PLATELETS: 246 10*3/uL (ref 150.0–400.0)
RBC: 3.73 Mil/uL — ABNORMAL LOW (ref 3.87–5.11)
RDW: 14.7 % (ref 11.5–15.5)
WBC: 6.2 10*3/uL (ref 4.0–10.5)

## 2014-05-26 LAB — FERRITIN: FERRITIN: 16.1 ng/mL (ref 10.0–291.0)

## 2014-05-26 LAB — CREATININE, SERUM: CREATININE: 1.17 mg/dL (ref 0.40–1.20)

## 2014-05-26 LAB — POTASSIUM: Potassium: 4.7 mEq/L (ref 3.5–5.1)

## 2014-05-26 LAB — IBC PANEL
Iron: 90 ug/dL (ref 42–145)
Saturation Ratios: 20.1 % (ref 20.0–50.0)
TRANSFERRIN: 320 mg/dL (ref 212.0–360.0)

## 2014-05-30 ENCOUNTER — Encounter: Payer: Self-pay | Admitting: *Deleted

## 2014-06-03 ENCOUNTER — Ambulatory Visit: Payer: Self-pay | Admitting: Internal Medicine

## 2014-06-03 DIAGNOSIS — Z1231 Encounter for screening mammogram for malignant neoplasm of breast: Secondary | ICD-10-CM | POA: Diagnosis not present

## 2014-06-03 LAB — HM MAMMOGRAPHY: HM MAMMO: NEGATIVE

## 2014-06-07 ENCOUNTER — Telehealth: Payer: Self-pay | Admitting: *Deleted

## 2014-06-07 MED ORDER — CLOPIDOGREL BISULFATE 75 MG PO TABS: 75.0000 mg | ORAL_TABLET | Freq: Every day | ORAL | Status: DC

## 2014-06-07 NOTE — Telephone Encounter (Signed)
°  1. Which medications need to be refilled? Generic plavix  2. Which pharmacy is medication to be sent to? [facility] in [place]   3. Do they need a 30 day or 90 day supply? 90 day would be best please.   4. Would they like a call back once the medication has been sent to the pharmacy? no

## 2014-06-07 NOTE — Telephone Encounter (Signed)
Refill sent for plavix  

## 2014-06-12 ENCOUNTER — Other Ambulatory Visit: Payer: Self-pay | Admitting: Internal Medicine

## 2014-06-17 ENCOUNTER — Encounter: Payer: Self-pay | Admitting: Internal Medicine

## 2014-06-17 ENCOUNTER — Ambulatory Visit (INDEPENDENT_AMBULATORY_CARE_PROVIDER_SITE_OTHER): Payer: Medicare Other | Admitting: Internal Medicine

## 2014-06-17 VITALS — BP 120/60 | HR 62 | Temp 98.2°F | Ht 60.0 in | Wt 144.4 lb

## 2014-06-17 DIAGNOSIS — E7800 Pure hypercholesterolemia, unspecified: Secondary | ICD-10-CM

## 2014-06-17 DIAGNOSIS — I2510 Atherosclerotic heart disease of native coronary artery without angina pectoris: Secondary | ICD-10-CM

## 2014-06-17 DIAGNOSIS — Z0000 Encounter for general adult medical examination without abnormal findings: Secondary | ICD-10-CM

## 2014-06-17 DIAGNOSIS — N289 Disorder of kidney and ureter, unspecified: Secondary | ICD-10-CM

## 2014-06-17 DIAGNOSIS — R197 Diarrhea, unspecified: Secondary | ICD-10-CM

## 2014-06-17 DIAGNOSIS — K219 Gastro-esophageal reflux disease without esophagitis: Secondary | ICD-10-CM

## 2014-06-17 DIAGNOSIS — E039 Hypothyroidism, unspecified: Secondary | ICD-10-CM

## 2014-06-17 DIAGNOSIS — D649 Anemia, unspecified: Secondary | ICD-10-CM

## 2014-06-17 DIAGNOSIS — I10 Essential (primary) hypertension: Secondary | ICD-10-CM | POA: Diagnosis not present

## 2014-06-17 DIAGNOSIS — K754 Autoimmune hepatitis: Secondary | ICD-10-CM

## 2014-06-17 LAB — BASIC METABOLIC PANEL
BUN: 28 mg/dL — AB (ref 6–23)
CO2: 24 meq/L (ref 19–32)
CREATININE: 1.01 mg/dL (ref 0.40–1.20)
Calcium: 9.3 mg/dL (ref 8.4–10.5)
Chloride: 107 mEq/L (ref 96–112)
GFR: 55.49 mL/min — AB (ref >60.00)
Glucose, Bld: 83 mg/dL (ref 70–99)
POTASSIUM: 4.4 meq/L (ref 3.5–5.1)
Sodium: 137 mEq/L (ref 135–145)

## 2014-06-17 MED ORDER — FUROSEMIDE 20 MG PO TABS
20.0000 mg | ORAL_TABLET | Freq: Every day | ORAL | Status: DC
Start: 2014-06-17 — End: 2015-01-11

## 2014-06-17 MED ORDER — OMEPRAZOLE 20 MG PO CPDR: 20.0000 mg | DELAYED_RELEASE_CAPSULE | Freq: Two times a day (BID) | ORAL | Status: DC

## 2014-06-17 NOTE — Progress Notes (Signed)
Pre visit review using our clinic review tool, if applicable. No additional management support is needed unless otherwise documented below in the visit note. 

## 2014-06-18 MED ORDER — POTASSIUM CHLORIDE CRYS ER 20 MEQ PO TBCR: EXTENDED_RELEASE_TABLET | ORAL | Status: DC

## 2014-06-20 ENCOUNTER — Encounter: Payer: Self-pay | Admitting: Internal Medicine

## 2014-06-26 ENCOUNTER — Encounter: Payer: Self-pay | Admitting: Internal Medicine

## 2014-06-26 NOTE — Assessment & Plan Note (Signed)
Physical 09/09/13.  Mammogram 06/03/14 - Birads I.

## 2014-06-26 NOTE — Progress Notes (Signed)
[name], female   DOB: 28-Oct-1928, 81 y.o.   MRN: [id number]   Subjective:    [name], female    DOB: May 27, 1928, 81 y.o.   MRN: [id number]  HPI  Patient here for a scheduled follow up.  Overall doing better.  Leg swelling better.  No chest pain or tightness.  Breathing stable.  Bowels better. Has had a couple of episodes - minimal.  No significant problem.  On probiotic.  Eating and drinking well.  Energy improving.    Past Medical History  Diagnosis Date  . Hypertension   . 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 Fibrocystic breast disease   . Osteoarthritis   . Inflammatory arthritis   . Autoimmune hepatitis     followed by Dr [name]  . Neuropathy   . History of colon polyps 2000  . MI (myocardial infarction)   . Thyroid disease   . CHF (congestive heart failure)   . Coronary artery disease     Non-ST elevation myocardial infarction in March 2016. Cardiac catheterization showed 60% ostial left circumflex hazy stenosis which was possibly the culprit. Mild LAD/RCA disease. EF 60% by echo.  . Hypercholesterolemia     Current Outpatient Prescriptions on File Prior to Visit  Medication Sig Dispense Refill  . amLODipine (NORVASC) 10 MG tablet TAKE ONE TABLET BY MOUTH EVERY DAY 90 tablet 1  . aspirin 81 MG tablet Take 81 mg by mouth daily.      . clopidogrel (PLAVIX) 75 MG tablet Take 1 tablet (75 mg total) by mouth daily. 90 tablet 3  . gabapentin (NEURONTIN) 300 MG capsule TAKE 2 CAPSULES BY MOUTH FOUR TIMES DAILY. 720 capsule 1  . ipratropium (ATROVENT) 0.06 % nasal spray Place 1 spray into both nostrils as needed.     Marland Kitchen levothyroxine (SYNTHROID, LEVOTHROID) 112 MCG tablet TAKE 1 TABLET BY MOUTH EVERY DAY BEFORE BREAKFAST 90 tablet 3  . metoprolol (LOPRESSOR) 50 MG tablet Take 1 tablet (50 mg total) by mouth 2 (two) times daily. 180 tablet 1  . Probiotic Product (ALIGN) 4 MG CAPS Take 1  capsule by mouth daily.    . traMADol (ULTRAM) 50 MG tablet One daily prn 60 tablet 1  . ursodiol (ACTIGALL) 300 MG capsule Take 300 mg by mouth. 2 morning - 1 night      No current facility-administered medications on file prior to visit.    Review of Systems  Constitutional: Negative for appetite change and unexpected weight change.  HENT: Negative for congestion and sinus pressure.   Respiratory: Negative for cough, chest tightness and shortness of breath.   Cardiovascular: Negative for chest pain, palpitations and leg swelling.  Gastrointestinal: Negative for nausea, vomiting and abdominal pain.       Diarrhea improved.    Genitourinary: Negative for dysuria and difficulty urinating.  Neurological: Negative for dizziness, light-headedness and headaches.  Psychiatric/Behavioral: Negative for dysphoric mood and agitation.       Objective:     Blood pressure recheck:  128/68  Physical Exam  HENT:  Nose: Nose normal.  Mouth/Throat: Oropharynx is clear and moist.  Neck: Neck supple. No thyromegaly present.  Cardiovascular: Normal rate and regular rhythm.   Pulmonary/Chest: Breath sounds normal. No respiratory distress. She has no wheezes.  Abdominal: Soft. Bowel sounds are normal. There is no tenderness.  Musculoskeletal: She exhibits no edema or tenderness.  Lymphadenopathy:  She has no cervical adenopathy.    BP 120/60 mmHg  Pulse 62  Temp(Src) 98.2 F (36.8 C) (Oral)  Ht 5' (1.524 m)  Wt 144 lb 6 oz (65.488 kg)  BMI 28.20 kg/m2  SpO2 97% Wt Readings from Last 3 Encounters:  06/17/14 144 lb 6 oz (65.488 kg)  05/24/14 143 lb 12 oz (65.205 kg)  05/17/14 145 lb 4 oz (65.885 kg)     Lab Results  Component Value Date   WBC 6.2 05/26/2014   HGB 11.7* 05/26/2014   HCT 35.5* 05/26/2014   PLT 246.0 05/26/2014   GLUCOSE 83 06/17/2014   CHOL 201* 04/12/2014   TRIG 138.0 04/12/2014   HDL 60.90 04/12/2014   LDLDIRECT 143.5 12/17/2012   LDLCALC 113* 04/12/2014    ALT 20 05/09/2014   AST 18 05/09/2014   NA 137 06/17/2014   K 4.4 06/17/2014   CL 107 06/17/2014   CREATININE 1.01 06/17/2014   BUN 28* 06/17/2014   CO2 24 06/17/2014   TSH 1.95 04/12/2014   INR 0.9 05/10/2014       Assessment & Plan:   Problem List Items Addressed This Visit    Autoimmune hepatitis    Followed by GI.  Hold on statin therapy.  Follow liver panel.        CAD (coronary artery disease)    Recently admitted with non ST elevation MI.  Heart cath revealed 60% left cfx lesion.  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  Feels better.  Follow.        Relevant Medications   furosemide (LASIX) 20 MG tablet   Diarrhea    Improved.  On probiotic.        Relevant Orders   CBC with Differential/Platelet   GERD (gastroesophageal reflux disease)    Symptoms controlled.  On omeprazole.        Relevant Medications   omeprazole (PRILOSEC) 20 MG capsule   Other Relevant Orders   Hepatic function panel   Health care maintenance    Physical 09/09/13.  Mammogram 06/03/14 - Birads I.        Hypercholesterolemia   Relevant Medications   furosemide (LASIX) 20 MG tablet   Other Relevant Orders   Lipid panel   Hypertension    Blood pressure doing well.  Same medication regimen.  Follow pressures.  Follow metabolic panel.        Relevant Medications   furosemide (LASIX) 20 MG tablet   Hypothyroidism    On thyroid replacement.  Follow tsh.       Renal insufficiency - Primary    Recheck met b today.  Stay hydrated.       Relevant Orders   Basic metabolic panel (Completed)   Basic metabolic panel    Other Visit Diagnoses    Anemia, unspecified anemia type        Relevant Orders    Ferritin        Einar Pheasant, MD

## 2014-06-26 NOTE — Assessment & Plan Note (Signed)
Recheck met b today.  Stay hydrated.

## 2014-06-26 NOTE — Assessment & Plan Note (Signed)
Symptoms controlled.  On omeprazole.

## 2014-06-26 NOTE — Assessment & Plan Note (Signed)
On thyroid replacement.  Follow tsh.  

## 2014-06-26 NOTE — Assessment & Plan Note (Signed)
Improved.  On probiotic.

## 2014-06-26 NOTE — Assessment & Plan Note (Signed)
Recently admitted with non ST elevation MI.  Heart cath revealed 60% left cfx lesion.  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  Feels better.  Follow.

## 2014-06-26 NOTE — Assessment & Plan Note (Signed)
Followed by GI.  Hold on statin therapy.  Follow liver panel.

## 2014-06-26 NOTE — Assessment & Plan Note (Signed)
Blood pressure doing well.  Same medication regimen.  Follow pressures.  Follow metabolic panel.   

## 2014-07-04 ENCOUNTER — Encounter
Admit: 2014-07-04 | Disposition: A | Discharge status: Home / Self Care | Payer: Self-pay | Attending: Cardiovascular Disease | Admitting: Cardiovascular Disease

## 2014-07-04 DIAGNOSIS — R0602 Shortness of breath: Secondary | ICD-10-CM | POA: Diagnosis not present

## 2014-07-04 DIAGNOSIS — I5032 Chronic diastolic (congestive) heart failure: Secondary | ICD-10-CM | POA: Diagnosis not present

## 2014-07-10 NOTE — H&P (Signed)
PATIENT NAME:  Ruth Roth, Ruth Roth MR#:  [id number] DATE OF BIRTH:  05-30-1928  DATE OF ADMISSION:  05/10/2014  PRIMARY CARE PHYSICIAN:  Dr. Einar Pheasant.   REFERRING EMERGENCY ROOM PHYSICIAN:  Dr. Hinda Kehr.    CHIEF COMPLAINT: Shortness of breath and weight gain.   HISTORY OF PRESENT ILLNESS: An 81 year old female who has past history of hypertension, peptic ulcer disease, fibrocystic breast disease, inflammatory arthritis as per the record, but the patient refused about any arthritis, autoimmune hepatitis followed by Dr. [name], neuropathy, had some diarrhea 2 weeks ago and was given Cipro and Flagyl by primary care, was taking that and as she was thinking to compensate with her dehydration started drinking a lot of liquids and water. She gained 12 pounds weight in last 1-2 weeks and started feeling short of breath, so she went to primary care doctor's office where Dr. Derrel Nip was covering yesterday.  She  sent her to the hospital for x-ray and started her on Lasix for possible CHF.  She also had some leg swelling and feeling short of breath, especially while lying down.  Her problem continued to get worse, last night she could not sleep, she had to get seated up and she also started having some back pain early in the morning and so finally decided to come to the Emergency Room today. On further questioning she denies any chest pain. She denies any cough or sputum production or any fever. Noted to have elevated BNP level and elevated troponin in the ER and so given to hospitalist team after starting on hepatin IV drip.    REVIEW OF SYSTEMS:   CONSTITUTIONAL: Negative for fever, fatigue, weakness, pain, or weight loss. She gained 10- 12 pounds weight.  EYES: No blurring or double vision, discharge, or redness.  EARS, NOSE, THROAT: No tinnitus, ear pain, or hearing loss.  RESPIRATORY: No cough, wheezing, hemoptysis, or shortness of breath.  CARDIOVASCULAR: No chest pain, orthopnea, edema, arrhythmia,  palpitations.  GASTROINTESTINAL: No nausea, vomiting, diarrhea, abdominal pain.  GENITOURINARY:  No dysuria, hematuria, increased frequency.  ENDOCRINE: No heat or cold intolerance. No excessive sweating.  SKIN: No acne, rashes, or lesions.  MUSCULOSKELETAL: No pain or swelling in the joints.  NEUROLOGICAL: No numbness, weakness, tremor or vertigo.  PSYCHIATRIC:  No anxiety, insomnia, bipolar disorder.   PAST MEDICAL HISTORY:  1. Hypertension.   2. Peptic ulcer disease.  3. Fibrocystic breast disease.  4. Inflammatory arthritis.  5. Autoimmune hepatitis.  6. Neuropathy.  7. History of colon polyp.   PAST SURGICAL HISTORY:  1. Appendectomy.  2. Billroth.  3. Colonoscopy.  4. Cholecystectomy.  5. Upper GI endoscopy.  6. Abdominal hysterectomy.   SOCIAL HISTORY: She lives alone, does not require any support to walk. Never smoked. No drinking and no drug use.   FAMILY HISTORY: Brother had lung cancer, he was a smoker. No diabetes history in the family. No cardiac history in family    HOME MEDICATIONS: As per the records from her PMD's office yesterday.  1. Amlodipine 10 mg once a day.  2. Aspirin 81 mg once a day.  3. Cephalexin 500 mg capsule, 500 mg total by mouth 3 times a day.  4. Gabapentin 300 mg 2 capsules by mouth 4 times a day.  5. Levothyroxine 112 mcg tablet once a day.  6. Metoprolol 50 mg oral tablet 2 times a day.  7. Omeprazole 20 mg 2 times a day.  8. Probiotic 4 mg capsule once a day.  9. Tramadol 50 mg oral daily p.r.n.  10. Ursodiol 300 mg capsule at nighttime.  11. Amitriptyline 10 mg oral at bedtime.  12. Furosemide 20 mg tablet 2 times a day.    PHYSICAL EXAMINATION:  VITAL SIGNS: In ER temperature 98.3, pulse 62, respirations 18, blood pressure 137/67, pulse oximetry is 98 on room air.  GENERAL: The patient is fully alert and oriented to time, place, and person. Does not appear in acute distress.  HEENT: Head and neck atraumatic. Conjunctivae pink.  Sclerae anicteric. Pupils equally reactive.  Oral mucosa moist.  NECK: Supple. No JVD. Thyroid nontender.  RESPIRATORY: Bilateral equal air entry. Some crepitation present. No wheezing.  CARDIOVASCULAR: S1, S2 present, regular. No local tenderness on palpation.  ABDOMEN: Soft, nontender. Bowel sounds present. No organomegaly.  SKIN: No acne, rashes, or lesions.  JOINTS: No swelling or tenderness.  NEUROLOGICAL: Power 5 out of 5. Follows commands. Moves all 4 limbs. Sensation intact. No tremor or rigidity.  PSYCHIATRIC: Does not appear in acute psychiatric illness at this time.  LYMPHATIC:  No cervical lymphadenopathy.  GENITOURINARY: Deferred.   IMPORTANT LABORATORY RESULTS:   1.  BNP is 3300.  2.  Glucose 93, BUN 12, creatinine 1.10, sodium 145, potassium 3.1, chloride 110, CO2 of 29. Calcium is 8.2.  3.  Total protein 6.8, albumin 3.1, bilirubin 0.4, alkaline phosphate 62, SGOT 22, SGPT 25. 4.  Troponin 1.10.  5.  WBC is 8.5, hemoglobin 11.1, platelet count 277,000, MCV is 97.  6.  INR is 0.9.  7.  Chest x-ray PA and lateral is done, showed underlying emphysema, small right effusion unchanged, stable basilar atelectasis, no new patchy, no change in cardiac silhouette.    ASSESSMENT AND PLAN: An 81 year old female with history of hypertension and some diarrhea for the last few days, started having edema on the legs after doing a lot of liquids to compensate for her diarrhea and was short of breath, came in for this complaint.   1.  Acute congestive heart failure. Most likely this is diastolic. We do not have echocardiogram in the system. We will get one.  We will get cardiology consult and give her IV Lasix and monitor in and out.  2.  Non-ST-elevation myocardial infarction. Troponin is 1.1. Started on heparin drip by ER physician, I agree with that.  We will follow serial troponin and admit on telemetry, continue monitoring, and get cardiology evaluation for further management. Right now we  will get echocardiogram. She has allergic response to statin, so would like to avoid that at this time, but will have to decide after we finalize about her coronary artery disease.  3.  Hypertension. She will be on Lasix 3 times a day, so we will just continue monitoring at this time and not give any additional antihypertensive, she takes metoprolol at home.  4.  Hypothyroidism. Continue levothyroxine as she is taking at home.  5.  Peptic ulcer disease. She takes omeprazole, will give pantoprazole in hospital.  6.  Neuropathy.  We will continue gabapentin as she is taking at home.   CODE STATUS: Full code.   TOTAL TIME SPENT ON THIS ADMISSION: 50 minutes.     ____________________________ Ceasar Lund Anselm Jungling, MD [id number] D: 05/10/2014 17:30:11 ET T: 05/10/2014 17:48:39 ET JOB#: [id number]  cc: Ceasar Lund. Anselm Jungling, MD, <Dictator> Einar Pheasant, MD Vaughan Basta MD ELECTRONICALLY SIGNED 05/26/2014 11:44

## 2014-07-10 NOTE — Consult Note (Signed)
General Aspect Primary Cardiologist: New to [facility] _________________  81 year old female with history of autoimmune hepatitis, HTN, HLD, PUD, fibrocystic breast disease, inflammatory arthritis, and history of recent GI illness consisting of diarrhea 2 weeks prior who presented to [facility] on 3/1 with shortness of breath and weight gain of 12 pounds in last 2 weeks.  _________________  PMH: 1. Autoimmune hepatitis 2. HTN 3. HLD 4. PUD 5. Fibrocystic breast disease 6. Inflammatory arthritis 7. Recent GI illness consisting of diarrhea 2 weeks prior _________________   Present Illness 81 year old female with the above problem list who presented to [facility] with shortness of breath and weight gain of 12 pounds in last 2 weeks. No previously known cardiology history. She did undergo a stress echo back in 12/2012 secondary to chest pain. Test at that time showed no wall motion abnormalities, normal LV function. She was not seen by cardiology at that time (ordered by PCP). She was seen by PCP 04/14/14 for palpitations. She was referred to cardiology (this appointment has not yet occured). She again presented to her PCP on 05/05/14 with concerns regarding lower extremity swelling, redness and pain. Had been having diarrhea for 2 1/2 weeks at that time. Was seen in ER. She did have some epigastric pain at that time, felt to be gastroenteritis. No troponins were checked. Had CT scan.??Found colitis.??C.diff negative. Just completed cipro and flagyl. Some occasional abdominal discomfort. Had lower extremity dopplers to rule out DVT, that was negative for DVT but showed superficial clot in a varicose vein in upper medial calf . Placed on Kelfex. Lasix was held by PCP 2/2 intravascular volume depletion. Seen again by PCP on 2/29 for new onset exertional dyspnea and weight gain of 6 pounds since the week prior and relative hypoxemia. Continued diarrhea. Treated in ER with IV fluids. She was placed on Lasix 20 mg bid and  treated for CAP. Levaquin was called in on 3/1.  She presented to [facility] on 3/1 with increased SOB and 12 pound weight gain in 2 weeks. She was also noted to have an episode of left flank pain on 3/1 that occured at rest prior to her arrival. No associated chest pain, SOB, diaphoresis, nausea, vomiting, presyncope, or syncope. She again had this left flank pain after her admission overnight going into 3/2. She notified her nurse. Uncertain what was done at that time. She is currently asymptomatic. Her diarrhea has since improved.   Upon her arrival to [facility] she was noted to have a pBNP of 3301, SCr 1.59-->1.10, K+ 3.0-->3.1-->3.4, troponin 1.10-->1.20-->1.00, WBC count 7.0-->8.5-->7.7, CXR underlying emphysema. Small right effusion, unchanged. Stable bibasilar atelectasis. CT abdomen and pelvis showed diffuse wall thickening throughout the colon and in the distal small bowel, suggesting enterocolitis, likely infectious or inflammatory. She was started on heparin gtt. She is currently asymptomatic.   Physical Exam:  GEN no acute distress   HEENT hearing intact to voice   NECK supple  no JVD   RESP normal resp effort  clear BS   CARD Regular rate and rhythm  No murmur   ABD denies tenderness  soft   EXTR trace RLE   SKIN normal to palpation   NEURO cranial nerves intact   PSYCH alert   Review of Systems:  General: Fatigue  Weakness   Skin: No Complaints   ENT: No Complaints   Eyes: No Complaints   Neck: No Complaints   Respiratory: Short of breath   Cardiovascular: Dyspnea  Orthopnea  Edema   Gastrointestinal: Nausea  Diarrhea   Genitourinary: No Complaints   Vascular: No Complaints   Musculoskeletal: No Complaints   Neurologic: No Complaints   Hematologic: No Complaints   Endocrine: No Complaints   Psychiatric: No Complaints   Review of Systems: All other systems were reviewed and found to be negative   Family & Social History:  Family and Social History:   Family History Hypertension   Social History negative tobacco, negative ETOH, negative Illicit drugs   Place of Living Home     hypothyroidism:    HTN:    IDA:    Hysterectomy - Partial:    Cholecystectomy:    Appendectomy:    stomach surgery: "removed part of my stomach"  Home Medications: Medication Instructions Status  Flagyl 500 mg oral tablet 1 tab(s) orally 2 times a day Active  neurontin 633m qid  Active  aspirin 851mdaily  Active  Align 4 mg oral capsule 1 cap(s) orally once a day Active  metoprolol 50 mg oral tablet 1 tab(s) orally 2 times a day Active  omeprazole 20 mg oral delayed release capsule 1 cap(s) orally 2 times a day Active  traMADol 50 mg oral tablet 1 tab(s) orally once a day, As Needed - for Pain Active  ursodiol 300 mg oral capsule 2 cap(s) orally once a day Active  ursodiol 300 mg oral capsule 1 cap(s) orally once a day (at bedtime) Active  amitriptyline 10 mg oral tablet 2 tab(s) orally once a day (at bedtime) Active  amLODIPine 10 mg oral tablet 1 tab(s) orally once a day Active  cephalexin 500 mg oral capsule 1 cap(s) orally 3 times a day (with meals) Active  furosemide 20 mg oral tablet 1 tab(s) orally 2 times a day Active  Synthroid 112 mcg (0.112 mg) oral tablet 1 tab(s) orally once a day Active   Lab Results:  Routine Chem:  01-Mar-16 13:30   B-Type Natriuretic Peptide ([facility])  3301 (Result(s) reported on 10 May 2014 at 02:25PM.)  02-Mar-16 00:10   Creatinine (comp) 1.01  eGFR (African American) >60  eGFR (Non-African American)  56 (eGFR values <6042min/1.73 m2 may be an indication of chronic kidney disease (CKD). Calculated eGFR, using the MRDR Study equation, is useful in  patients with stable renal function. The eGFR calculation will not be reliable in acutely ill patients when serum creatinine is changing rapidly. It is not useful in patients on dialysis. The eGFR calculation may not be applicable to patients at the low and  high extremes of body sizes, pregnant women, and vegetarians.)  Potassium, Serum  3.4 (Result(s) reported on 11 May 2014 at 09:03AM.)  Result Comment TROPONIN - RESULTS VERIFIED BY REPEAT TESTING.  - PREVIOUSLY CALLED AT 1432 05/10/2014  Result(s) reported on 11 May 2014 at 01:27AM.  Cardiac:  01-Mar-16 13:30   Troponin I  1.10 (0.00-0.05 0.05 ng/mL or less: NEGATIVE  Repeat testing in 3-6 hrs  if clinically indicated. >0.05 ng/mL: POTENTIAL  MYOCARDIAL INJURY. Repeat  testing in 3-6 hrs if  clinically indicated. NOTE: An increase or decrease  of 30% or more on serial  testing suggests a  clinically important change)  CPK-MB, Serum  4.8 (Result(s) reported on 10 May 2014 at 03:42PM.)  CPK-MB, Serum  5.0 (Result(s) reported on 10 May 2014 at 02:25PM.)    17:12   CPK-MB, Serum  4.3 (Result(s) reported on 10 May 2014 at 05:57PM.)    18:55   Troponin I  1.20 (0.00-0.05  0.05 ng/mL or less: NEGATIVE  Repeat testing in 3-6 hrs  if clinically indicated. >0.05 ng/mL: POTENTIAL  MYOCARDIAL INJURY. Repeat  testing in 3-6 hrs if  clinically indicated. NOTE: An increase or decrease  of 30% or more on serial  testing suggests a  clinically important change)    21:35   CPK-MB, Serum 3.2 (Result(s) reported on 10 May 2014 at 10:24PM.)  02-Mar-16 00:10   Troponin I  1.00 (0.00-0.05 0.05 ng/mL or less: NEGATIVE  Repeat testing in 3-6 hrs  if clinically indicated. >0.05 ng/mL: POTENTIAL  MYOCARDIAL INJURY. Repeat  testing in 3-6 hrs if  clinically indicated. NOTE: An increase or decrease  of 30% or more on serial  testing suggests a  clinically important change)  Routine Hem:  02-Mar-16 00:10   WBC (CBC) 7.7  RBC (CBC)  3.23  Hemoglobin (CBC)  10.2  Hematocrit (CBC)  31.4  Platelet Count (CBC) 268  MCV 97  MCH 31.6  MCHC 32.6  RDW 14.2  Neutrophil % 58.8  Lymphocyte % 29.0  Monocyte % 9.4  Eosinophil % 2.1  Basophil % 0.7  Neutrophil # 4.5  Lymphocyte # 2.2   Monocyte # 0.7  Eosinophil # 0.2  Basophil # 0.1 (Result(s) reported on 11 May 2014 at 12:41AM.)   EKG:  EKG Interp. by me   Interpretation NSR, 70 bpm, left axis deviation, no st/t changes   Radiology Results: XRay:    01-Mar-16 14:14, Chest PA and Lateral  Chest PA and Lateral   REASON FOR EXAM:    sob  COMMENTS:   May transport without cardiac monitor    PROCEDURE: DXR - DXR CHEST PA (OR AP) AND LATERAL  - May 10 2014  2:14PM     CLINICAL DATA:  Wheezing and shortness of breath    EXAM:  CHEST  2 VIEW    COMPARISON:  May 09, 2014    FINDINGS:  There is a degree of underlying emphysematous change. There is a  small right effusion. There is patchy bibasilar atelectasis. There  is no frank edema or consolidation. Heart is upper normal in size  with pulmonary vascularity within normal limits. There is  atherosclerotic change in the aorta. No bone lesions are  appreciable. There are surgical clips in the upper abdomen extending  to the gastroesophageal junction.     IMPRESSION:  Underlying emphysema. Small right effusion, unchanged. Stable  bibasilar atelectasis. No new opacity. No change in cardiac  silhouette.      Electronically Signed    By: Lowella Grip III M.D.    On: 05/10/2014 14:54     Verified By: Leafy Kindle. WOODRUFF, M.D.,    Librax: Hives  Vital Signs/Nurse's Notes: **Vital Signs.:   02-Mar-16 12:28  Vital Signs Type Routine  Temperature Temperature (F) 98.4  Celsius 36.8  Temperature Source oral  Pulse Pulse 78  Respirations Respirations 18  Systolic BP Systolic BP 903  Diastolic BP (mmHg) Diastolic BP (mmHg) 68  Mean BP 93  Pulse Ox % Pulse Ox % 90  Pulse Ox Activity Level  At rest  Oxygen Delivery Room Air/ 21 %    Impression 81 year old female with history of autoimmune hepatitis, HTN, HLD, PUD, fibrocystic breast disease, inflammatory arthritis, and history of recent GI illness consisting of diarrhea 2 weeks prior who  presented to [facility] on 3/1 with shortness of breath and weight gain of 12 pounds in last 2 weeks, found to have a NSTEMI.  1. NSTEMI: -Cardiac cath 05/12/14, discussed with Dr. Ellyn Hack -Risks and benefits of cardiac cath were discussed with patient in detial in cluding bleeding, brusing, kidney damage, stroke, and death. She understands these risks and is willing to move forward with the procedure -Continue heparin gtt -Nitro paste to ensure patient is pain free 2/2 recurrent symptoms overnight (if she develops HA with this she may use tramadol as she is unable to use Tylenol 2/2 her liver pathology and would avoid NSAIDs in the setting of heparin) -Aspirin 81 mg daily -Continue Lopressor 50 mg bid -Check FLP and A1C for further risk stratification (history of statin intolerance - may need to revisit this) -Echo is pending to evaluate LV function and wall motion  2. Acute CHF: -Possibly ischemic -Hold Lasix pre-cath -Echo pending as above -She received 2-3L of IV fluids in the ED for volume repletion in the setting of her recent GI illness -Continue Lopressor as above -Consider ACEi post cath, pending SCr -No IV fluids  3. HTN: -Continue Lopressor as above -Lasix on hold pre-cath -Monitor  4. HLD: -Check FLP  5. PUD: -On PPI   Plan Attending Attestation:  I was present with Mr. Idolina Primer, Hershal Coria during the initial consultation and physical examination. Together we reviewed the chart and all available data. The patient does not have a prior cardiac history but does have hypertension and dyslipidemia as well as inflammatory arthritis. Her history of present illness is relatively interesting in that she apparently had a viral gastroenteritis with diarrhea and became profoundly dehydrated. She was therefore aggressively rehydrated in the emergency room and is now noted worsening edema. This was then followed by an episode last night with them at least 2 more recurrences of left-sided back pain in  the mid back behind the scapula. I'm concerned that that may very well be an anginal equivalent. Her troponin levels have been positive. The child is not necessarily consistent with non-STEMI however with the discomfort in the back which is essentially the same location of the heart posteriorly with diastolic heart failure symptoms I'm concerned for possible acute coronary syndrome. We have discussed the potential treatment options she would prefer to proceed with invasive evaluation with cardiac catheterization after hearing both potential options. I discussed the procedure with risks, benefits, alternatives and indications of procedure in detail. She voiced understanding and agrees proceed.  The patient will be scheduled for cardiac catheterization morning when Dr. Fletcher Anon. Nothing by mouth after midnight.  I agree with the remainder the recommendations as noted above. would start nitroglycerin paste that she has episodes of pain yesterday. Continue beta blocker and aspirin along with IV heparin.hold Lasix precath.  echocardiogram has been ordered. Would suspect probable diastolic dysfunction.   [place]   Electronic Signatures: Christell Faith M (PA-C)  (Signed 02-Mar-16 13:29)  Authored: General Aspect/Present Illness, History and Physical Exam, Review of System, Family & Social History, Past Medical History, Home Medications, Labs, EKG , Radiology, Allergies, Vital Signs/Nurse's Notes, Impression/Plan Leonie Man (MD)  (Signed 02-Mar-16 18:53)  Authored: Impression/Plan  Co-Signer: General Aspect/Present Illness, Home Medications, Allergies, Impression/Plan   Last Updated: 02-Mar-16 18:53 by Leonie Man (MD)

## 2014-07-10 NOTE — Discharge Summary (Signed)
Dates of Admission and Diagnosis:  Date of Admission 10-May-2014   Date of Discharge 12-May-2014   Admitting Diagnosis sob   Final Diagnosis 1. Acute on chronic diastolic congestive heart failure  2. Non ST elevation myocardial infarction 3. hypertension    Chief Complaint/History of Present Illness CHIEF COMPLAINT: Shortness of breath and weight gain.   HISTORY OF PRESENT ILLNESS: An 81 year old female who has past history of hypertension, peptic ulcer disease, fibrocystic breast disease, inflammatory arthritis as per the record, but the patient refused about any arthritis, autoimmune hepatitis followed by Dr. [name], neuropathy, had some diarrhea 2 weeks ago and was given Cipro and Flagyl by primary care, was taking that and as she was thinking to compensate with her dehydration started drinking a lot of liquids and water. She gained 12 pounds weight in last 1-2 weeks and started feeling short of breath, so she went to primary care doctor's office where Dr. Derrel Nip was covering yesterday.  She  sent her to the hospital for x-ray and started her on Lasix for possible CHF.  She also had some leg swelling and feeling short of breath, especially while lying down.  Her problem continued to get worse, last night she could not sleep, she had to get seated up and she also started having some back pain early in the morning and so finally decided to come to the Emergency Room today. On further questioning she denies any chest pain. She denies any cough or sputum production or any fever. Noted to have elevated BNP level and elevated troponin in the ER and so given to hospitalist team after starting on hepatin IV drip.   Allergies:  Librax: Hives  Pertinent Past History:  Pertinent Past History PAST MEDICAL HISTORY:  1. Hypertension.   2. Peptic ulcer disease.  3. Fibrocystic breast disease.  4. Inflammatory arthritis.  5. Autoimmune hepatitis.  6. Neuropathy.  7. History of colon polyp.   PAST  SURGICAL HISTORY:  1. Appendectomy.  2. Billroth.  3. Colonoscopy.  4. Cholecystectomy.  5. Upper GI endoscopy.  6. Abdominal hysterectomy.   Hospital Course:  Hospital Course 31F with hypertension here LE edema  * Acute on chronic diastolic congestive heart failure  On lasix. Improving. Lasix 20mg  at d/c. With Kcl supplement.  * Non ST elevation myocardial infarction ASA, Plavix, Statin, heparin drip. BB. Cath showed stenosis of ostial which was thought to need only medical management by cardiology.  * hypertension  d/c time 40 minutes   Condition on Discharge Fair   PHYSICAL EXAM ON DISCHARGE:  Physical Exam:  GEN no acute distress   RESP normal resp effort  clear BS   CARD regular rate  no murmur   DISCHARGE INSTRUCTIONS HOME MEDS:  Medication Reconciliation: Patient's Home Medications at Discharge:     Medication Instructions  aspirin 81mg  daily     neurontin 600mg  qid     synthroid 112 mcg (0.112 mg) oral tablet  1 tab(s) orally once a day   align 4 mg oral capsule  1 cap(s) orally once a day   metoprolol 50 mg oral tablet  1 tab(s) orally 2 times a day   omeprazole 20 mg oral delayed release capsule  1 cap(s) orally 2 times a day   tramadol 50 mg oral tablet  1 tab(s) orally once a day, As Needed - for Pain   ursodiol 300 mg oral capsule  1 cap(s) orally once a day (at bedtime)   amitriptyline 10 mg oral tablet  2 tab(s) orally once a day (at bedtime)   furosemide 20 mg oral tablet  1 tab(s) orally 2 times a day   nitroglycerin 0.4 mg sublingual tablet  1 tab(s) sublingual prn, As Needed, chest pain , As needed, chest pain   lovastatin 40 mg oral tablet  1 tab(s) orally once a day   lasix 20 mg oral tablet  1 tab(s) orally once a day   amlodipine 10 mg oral tablet  1 tab(s) orally once a day   clopidogrel 75 mg oral tablet  1 tab(s) orally once a day   k-tab 20 meq oral tablet, extended release  1 tab(s) orally once a day   ursodiol 300 mg oral capsule  2  cap(s) orally once a day    STOP TAKING THE FOLLOWING MEDICATION(S):    cephalexin 500 mg oral capsule: 1 cap(s) orally 3 times a day (with meals)  Physician's Instructions:  Diet Low Sodium   Activity Limitations As tolerated   Return to Work Not Applicable   Time frame for Follow Up Appointment 1-2 weeks  Dr. Fletcher Anon   Time frame for Follow Up Appointment 1-2 weeks  primary care provider   Other Comments You will need blood work for liver with your primary care provider now that you are on a statin medication.   Electronic Signatures: Miles Borkowski, Lottie Dawson (MD)  (Signed 06-Mar-16 09:39)  Authored: ADMISSION DATE AND DIAGNOSIS, CHIEF COMPLAINT/HPI, Allergies, PERTINENT PAST HISTORY, HOSPITAL COURSE, PHYSICAL EXAM ON DISCHARGE, DISCHARGE INSTRUCTIONS HOME MEDS, PATIENT INSTRUCTIONS   Last Updated: 06-Mar-16 09:39 by Alba Destine (MD)

## 2014-07-18 ENCOUNTER — Encounter: Payer: Medicare Other | Attending: Cardiovascular Disease | Admitting: *Deleted

## 2014-07-18 DIAGNOSIS — I214 Non-ST elevation (NSTEMI) myocardial infarction: Secondary | ICD-10-CM | POA: Insufficient documentation

## 2014-07-18 NOTE — Progress Notes (Signed)
Daily Session Note  Patient Details  Name: Ruth Roth MRN: [id number] Date of Birth: 12-11-1928 Referring Provider:  [name], MD  Encounter Date: 07/18/2014  Check In:     Session Check In - 07/18/14 1328    Check-In   Staff Present Heath Lark RN, BSN, CCRP;Jory Welke BS, ACSM CEP Exercise Physiologist;Renee Sadler MS, ACSM CEP Exercise Physiologist   ER physicians immediately available to respond to emergencies See telemetry face sheet for immediately available ER MD   Warm-up and Cool-down Performed on first and last piece of equipment   VAD Patient? No   Pain Assessment   Currently in Pain? No/denies   Multiple Pain Sites No         Goals Met:  Proper associated with RPD/PD & O2 Sat Independence with exercise equipment Exercise tolerated well No cardiac symptoms reported with exercise.  Goals Unmet:  Not Applicable  Goals Comments:    Dr. Emily Filbert is Medical Director for [facility] and [name].

## 2014-07-20 DIAGNOSIS — I214 Non-ST elevation (NSTEMI) myocardial infarction: Secondary | ICD-10-CM | POA: Diagnosis not present

## 2014-07-20 NOTE — Progress Notes (Signed)
Daily Session Note  Patient Details  Name: Ruth Roth MRN: [id number] Date of Birth: 08-02-1928 Referring Provider:  Einar Pheasant, MD  Encounter Date: 07/20/2014  Check In:     Session Check In - 07/20/14 0854    Check-In   Staff Present Heath Lark RN, BSN, CCRP;Eevee Borbon BS, ACSM EP-C, Exercise Physiologist;Renee Dillard [place] MS, ACSM CEP Exercise Physiologist   ER physicians immediately available to respond to emergencies See telemetry face sheet for immediately available ER MD   Medication changes reported     No   Fall or balance concerns reported    No   Warm-up and Cool-down Performed on first and last piece of equipment   VAD Patient? No   Pain Assessment   Currently in Pain? No/denies         Goals Met:  Proper associated with RPD/PD & O2 Sat Exercise tolerated well No report of cardiac concerns or symptoms  Goals Unmet:  Not Applicable  Goals Comments:    Dr. Emily Filbert is Medical Director for [facility] and [name].

## 2014-07-21 DIAGNOSIS — I214 Non-ST elevation (NSTEMI) myocardial infarction: Secondary | ICD-10-CM | POA: Diagnosis not present

## 2014-07-21 NOTE — Progress Notes (Signed)
Daily Session Note  Patient Details  Name: Ruth Roth MRN: [id number] Date of Birth: 12/24/1928 Referring Provider:  Arida, Muammar A, MD  Encounter Date: 07/21/2014  Check In:     Session Check In - 07/21/14 1711    Check-In   Staff Present Steven Way BS, ACSM EP-C, Exercise Physiologist;Carroll Enterkin RN, BSN;Diane Wright RN, BSN   ER physicians immediately available to respond to emergencies See telemetry face sheet for immediately available ER MD   Medication changes reported     No   Fall or balance concerns reported    No   Warm-up and Cool-down Performed on first and last piece of equipment   VAD Patient? No   Pain Assessment   Currently in Pain? No/denies         Goals Met:  Proper associated with RPD/PD & O2 Sat Exercise tolerated well No report of cardiac concerns or symptoms Strength training completed today  Goals Unmet:  Not Applicable  Goals Comments:    Dr. Mark Miller is Medical Director for [facility] and [name]. 

## 2014-07-25 ENCOUNTER — Encounter: Payer: Medicare Other | Admitting: *Deleted

## 2014-07-25 DIAGNOSIS — I214 Non-ST elevation (NSTEMI) myocardial infarction: Secondary | ICD-10-CM | POA: Diagnosis not present

## 2014-07-25 NOTE — Progress Notes (Signed)
Daily Session Note  Patient Details  Name: Ruth Roth MRN: [id number] Date of Birth: 1928/10/02 Referring Provider:  Einar Pheasant, MD  Encounter Date: 07/25/2014  Check In:     Session Check In - 07/25/14 0905    Check-In   Staff Present Heath Lark RN, BSN, CCRP;Kelly Hayes BS, ACSM CEP Exercise Physiologist;Kipton Skillen Dillard [place] MS, ACSM CEP Exercise Physiologist   ER physicians immediately available to respond to emergencies See telemetry face sheet for immediately available ER MD   Medication changes reported     No   Fall or balance concerns reported    No   Warm-up and Cool-down Performed on first and last piece of equipment   VAD Patient? No   Pain Assessment   Currently in Pain? No/denies   Multiple Pain Sites No         Goals Met:  Independence with exercise equipment Exercise tolerated well No report of cardiac concerns or symptoms  Goals Unmet:  Not Applicable  Goals Comments:    Dr. Emily Filbert is Medical Director for [place] [name].

## 2014-07-27 DIAGNOSIS — I214 Non-ST elevation (NSTEMI) myocardial infarction: Secondary | ICD-10-CM

## 2014-07-27 NOTE — Progress Notes (Signed)
Daily Session Note  Patient Details  Name: Ruth Roth MRN: [id number] Date of Birth: 1928-04-08 Referring Provider:  Einar Pheasant, MD  Encounter Date: 07/27/2014  Check In:     Session Check In - 07/27/14 0838    Check-In   Staff Present Heath Lark RN, BSN, CCRP;Zebbie Ace BS, ACSM EP-C, Exercise Physiologist;Renee Dillard [place] MS, ACSM CEP Exercise Physiologist   ER physicians immediately available to respond to emergencies See telemetry face sheet for immediately available ER MD   Medication changes reported     No   Fall or balance concerns reported    No   Warm-up and Cool-down Performed on first and last piece of equipment   VAD Patient? No   Pain Assessment   Currently in Pain? No/denies         Goals Met:  Proper associated with RPD/PD & O2 Sat Exercise tolerated well No report of cardiac concerns or symptoms Strength training completed today  Goals Unmet:  Not Applicable  Goals Comments:    Dr. Emily Filbert is Medical Director for [name].

## 2014-07-29 ENCOUNTER — Encounter: Payer: Medicare Other | Admitting: *Deleted

## 2014-07-29 DIAGNOSIS — I214 Non-ST elevation (NSTEMI) myocardial infarction: Secondary | ICD-10-CM

## 2014-07-29 NOTE — Progress Notes (Signed)
Daily Session Note  Patient Details  Name: Ruth Roth MRN: [id number] Date of Birth: October 13, 1928 Referring Provider:  Einar Pheasant, MD  Encounter Date: 07/29/2014  Check In:     Session Check In - 07/29/14 0938    Check-In   Staff Present Heath Lark RN, BSN, CCRP;Renee Dillard [place] MS, ACSM CEP Exercise Physiologist;Carroll Enterkin RN, BSN   ER physicians immediately available to respond to emergencies See telemetry face sheet for immediately available ER MD   Medication changes reported     No   Fall or balance concerns reported    No   Warm-up and Cool-down Performed on first and last piece of equipment   VAD Patient? No   Pain Assessment   Currently in Pain? No/denies         Goals Met:  Independence with exercise equipment Exercise tolerated well No report of cardiac concerns or symptoms Strength training completed today  Goals Unmet:  Not Applicable  Goals Comments:    Dr. Emily Filbert is Medical Director for [name].

## 2014-07-31 ENCOUNTER — Encounter: Payer: Self-pay | Admitting: *Deleted

## 2014-07-31 NOTE — Progress Notes (Signed)
Input data from previous EMR to update the Individualized Treatment Plan.

## 2014-08-01 ENCOUNTER — Encounter: Payer: Medicare Other | Admitting: *Deleted

## 2014-08-01 DIAGNOSIS — I214 Non-ST elevation (NSTEMI) myocardial infarction: Secondary | ICD-10-CM

## 2014-08-01 NOTE — Progress Notes (Signed)
Daily Session Note  Patient Details  Name: SHAYLEN NEPHEW MRN: [id number] Date of Birth: Aug 24, 1928 Referring Provider:  Einar Pheasant, MD  Encounter Date: 08/01/2014  Check In:     Session Check In - 08/01/14 0848    Check-In   Staff Present Earlean Shawl BS, ACSM CEP Exercise Physiologist;Renee Dillard [place] MS, ACSM CEP Exercise Physiologist;Susanne Bice RN, BSN, CCRP   ER physicians immediately available to respond to emergencies See telemetry face sheet for immediately available ER MD   Medication changes reported     No   Fall or balance concerns reported    No   Warm-up and Cool-down Performed on first and last piece of equipment   VAD Patient? No   Pain Assessment   Currently in Pain? No/denies   Multiple Pain Sites No         Goals Met:  Independence with exercise equipment Exercise tolerated well No report of cardiac concerns or symptoms  Goals Unmet:  Not Applicable  Goals Comments:    Dr. Emily Filbert is Medical Director for [place] [name].

## 2014-08-02 ENCOUNTER — Encounter: Payer: Self-pay | Admitting: *Deleted

## 2014-08-02 NOTE — Progress Notes (Signed)
Cardiac Individual Treatment Plan  Patient Details  Name: Ruth Roth MRN: [id number] Date of Birth: 11/16/1928 Referring Provider:  Dr.M Fletcher Anon Initial Encounter Date:  07/04/2014 Diagnosis NSTEMI  Patient's Home Medications on Admission:  Current outpatient prescriptions:  .  amLODipine (NORVASC) 10 MG tablet, TAKE ONE TABLET BY MOUTH EVERY DAY, Disp: 90 tablet, Rfl: 1 .  aspirin 81 MG tablet, Take 81 mg by mouth daily.  , Disp: , Rfl:  .  clopidogrel (PLAVIX) 75 MG tablet, Take 1 tablet (75 mg total) by mouth daily., Disp: 90 tablet, Rfl: 3 .  furosemide (LASIX) 20 MG tablet, Take 1 tablet (20 mg total) by mouth daily., Disp: 30 tablet, Rfl: 5 .  gabapentin (NEURONTIN) 300 MG capsule, TAKE 2 CAPSULES BY MOUTH FOUR TIMES DAILY., Disp: 720 capsule, Rfl: 1 .  ipratropium (ATROVENT) 0.06 % nasal spray, Place 1 spray into both nostrils as needed. , Disp: , Rfl:  .  levothyroxine (SYNTHROID, LEVOTHROID) 112 MCG tablet, TAKE 1 TABLET BY MOUTH EVERY DAY BEFORE BREAKFAST, Disp: 90 tablet, Rfl: 3 .  metoprolol (LOPRESSOR) 50 MG tablet, Take 1 tablet (50 mg total) by mouth 2 (two) times daily., Disp: 180 tablet, Rfl: 1 .  omeprazole (PRILOSEC) 20 MG capsule, Take 1 capsule (20 mg total) by mouth 2 (two) times daily., Disp: 180 capsule, Rfl: 3 .  potassium chloride SA (K-DUR,KLOR-CON) 20 MEQ tablet, Take one tablet every other day., Disp: 30 tablet, Rfl: 1 .  Probiotic Product (ALIGN) 4 MG CAPS, Take 1 capsule by mouth daily., Disp: , Rfl:  .  traMADol (ULTRAM) 50 MG tablet, One daily prn, Disp: 60 tablet, Rfl: 1 .  ursodiol (ACTIGALL) 300 MG capsule, Take 300 mg by mouth. 2 morning - 1 night , Disp: , Rfl:   Past Medical History: Past Medical History  Diagnosis Date  . Hypertension   . 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 Fibrocystic breast disease   . Osteoarthritis   . Inflammatory arthritis   . Autoimmune hepatitis     followed  by Dr [name]  . Neuropathy   . History of colon polyps 2000  . MI (myocardial infarction)   . Thyroid disease   . CHF (congestive heart failure)   . Coronary artery disease     Non-ST elevation myocardial infarction in March 2016. Cardiac catheterization showed 60% ostial left circumflex hazy stenosis which was possibly the culprit. Mild LAD/RCA disease. EF 60% by echo.  . Hypercholesterolemia     Tobacco Use: History  Smoking status  . Never Smoker   Smokeless tobacco  . Never Used    Labs: Recent Review Flowsheet Data    Labs for ITP Cardiac and Pulmonary Rehab Latest Ref Rng 09/02/2012 12/17/2012 09/16/2013 10/12/2013 04/12/2014   Cholestrol 0 - 200 mg/dL 227(H) 221(H) 189 208(H) 201(H)   LDLCALC 0 - 99 mg/dL - - 107(H) 130(H) 113(H)   LDLDIRECT - 152.8 143.5 - - -   HDL >39.00 mg/dL 60.60 53.20 60.00 63.70 60.90   Trlycerides 0.0 - 149.0 mg/dL 128.0 169.0(H) 108.0 73.0 138.0       Exercise Target Goals:    Exercise Program Goal: Individual exercise prescription set with THRR, safety & activity barriers. Participant demonstrates ability to understand and report RPE using BORG scale, to self-measure pulse accurately, and to acknowledge the importance of the exercise prescription.  Exercise Prescription Goal: Starting with aerobic activity 30 plus minutes a day,  3 days per week for initial exercise prescription. Provide home exercise prescription and guidelines that participant acknowledges understanding prior to discharge.  Activity Barriers & Risk Stratification:     Activity Barriers & Risk Stratification - 07/31/14 1151    Activity Barriers & Risk Stratification   Activity Barriers Joint Problems   Risk Stratification High      6 Minute Walk:     6 Minute Walk      07/04/14 1152       6 Minute Walk   Phase Initial     Distance 1180 feet     Walk Time 5.45 minutes     Resting HR 60 bpm     Resting BP 138/70 mmHg     Max Ex. HR 74 bpm     Max Ex. BP 142/40  mmHg     RPE 13     Symptoms Yes (comment)     Comments no symproms    stopped for 15 secs        Initial Exercise Prescription:   Exercise Prescription Changes:     Exercise Prescription Changes      07/27/14 0800 08/02/14 0800         Exercise Review   Progression Yes Yes      Response to Exercise   Blood Pressure (Admit)  136/80 mmHg      Blood Pressure (Exercise)  136/70 mmHg      Blood Pressure (Exit)  142/60 mmHg      Heart Rate (Admit)  72 bpm      Heart Rate (Exercise)  89 bpm      Heart Rate (Exit)  70 bpm      Rating of Perceived Exertion (Exercise)  12      Duration  Progress to 30 minutes of continuous aerobic without signs/symptoms of physical distress      Intensity  THRR unchanged      Progression  Continue progressive overload as per policy without signs/symptoms or physical distress.      Resistance Training   Training Prescription Yes Yes      Weight 2 2      Reps 10-12 10-12      Treadmill   MPH  2.2      Grade  0      Minutes  15      NuStep   Level 3 4      Watts 45 45      Minutes  20         Discharge Exercise Prescription:   Nutrition:  Target Goals: Understanding of nutrition guidelines, daily intake of sodium <1531m, cholesterol <202m calories 30% from fat and 7% or less from saturated fats, daily to have 5 or more servings of fruits and vegetables.  Biometrics:     Pre Biometrics - 07/04/14 1154    Pre Biometrics   Height 5' 2"  (1.575 m)   Weight 142 lb 14.4 oz (64.819 kg)   Waist Circumference 31.5 inches   Hip Circumference 39.75 inches   Waist to Hip Ratio 0.79 %   BMI (Calculated) 26.2       Nutrition Therapy Plan and Nutrition Goals:   Nutrition Discharge: Rate Your Plate Scores:   Nutrition Goals Re-Evaluation:     Nutrition Goals Re-Evaluation      08/01/14 0939           Personal Goal #1 Re-Evaluation   Personal Goal #1 Eats healthier since she started Cardiac Rehab  she reports.           Psychosocial: Target Goals: Acknowledge presence or absence of depression, maximize coping skills, provide positive support system. Participant is able to verbalize types and ability to use techniques and skills needed for reducing stress and depression.  Initial Review & Psychosocial Screening:     Initial Psych Review & Screening - 07/20/14 [place]? No   Concerns No support system  Ms. Holts has a very limited support system with a daughter who lives 30 miles away who seldom visits and a son in [place] area.  Her spouse passed 9 years ago.   Barriers   Psychosocial barriers to participate in program There are no identifiable barriers or psychosocial needs.;The patient should benefit from training in stress management and relaxation.   Screening Interventions   Interventions Encouraged to exercise      Quality of Life Scores:   PHQ-9:     Recent Review Flowsheet Data    Depression screen [facility] 2/9 09/09/2013 09/07/2012 05/10/2012 05/01/2012   Decreased Interest 0 0 0 0   Down, Depressed, Hopeless 0 0 0 0   PHQ - 2 Score 0 0 0 0      Psychosocial Evaluation and Intervention:     Psychosocial Evaluation - 07/20/14 0957    Psychosocial Evaluation & Interventions   Interventions Stress management education;Encouraged to exercise with the program and follow exercise prescription;Relaxation education   Comments Counselor met with Ms. Robards today for initial psychosocial evaluation.  She is a spunky 81 year old who has minimal health issues and reports sleeping and eating well.  She also denies a history of depression or current symptoms.  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  As a result she has a limited support system here locally other than her spouse's family stay in touch and she is part of a faith community.  Ms. Stringfield reports minimal stress in her life currently and is  generally in a positive mood most of the time.  Her goals for this program are to increase her energy and stamina and to exercise consistently.     Continued Psychosocial Services Needed --  Ms. Strum will benefit from all of the psychoeducational components of this program, as well as finding a follow up group to exercise with in order to maintain any progress made in this program and to increase her support system.      Psychosocial Re-Evaluation:   Vocational Rehabilitation: Provide vocational rehab assistance to qualifying candidates.   Vocational Rehab Evaluation & Intervention:     Vocational Rehab - 07/31/14 1152    Initial Vocational Rehab Evaluation & Intervention   Assessment shows need for Vocational Rehabilitation No      Education: Education Goals: Education classes will be provided on a weekly basis, covering required topics. Participant will state understanding/return demonstration of topics presented.  Learning Barriers/Preferences:     Learning Barriers/Preferences - 07/31/14 1151    Learning Barriers/Preferences   Learning Barriers None   Learning Preferences Video      Education Topics: General Nutrition Guidelines/Fats and Fiber: -Group instruction provided by verbal, written material, models and posters to present the general guidelines for heart healthy nutrition. Gives an explanation and review of dietary fats and fiber.   Controlling Sodium/Reading Food Labels: -Group verbal and written material supporting the discussion of  sodium use in heart healthy nutrition. Review and explanation with models, verbal and written materials for utilization of the food label.   Exercise Physiology & Risk Factors: - Group verbal and written instruction with models to review the exercise physiology of the cardiovascular system and associated critical values. Details cardiovascular disease risk factors and the goals associated with each risk factor.   Aerobic  Exercise & Resistance Training: - Gives group verbal and written discussion on the health impact of inactivity. On the components of aerobic and resistive training programs and the benefits of this training and how to safely progress through these programs.   Flexibility, Balance, General Exercise Guidelines: - Provides group verbal and written instruction on the benefits of flexibility and balance training programs. Provides general exercise guidelines with specific guidelines to those with heart or lung disease. Demonstration and skill practice provided.   Stress Management: - Provides group verbal and written instruction about the health risks of elevated stress, cause of high stress, and healthy ways to reduce stress.   Depression: - Provides group verbal and written instruction on the correlation between heart/lung disease and depressed mood, treatment options, and the stigmas associated with seeking treatment.   Anatomy & Physiology of the Heart: - Group verbal and written instruction and models provide basic cardiac anatomy and physiology, with the coronary electrical and arterial systems. Review of: AMI, Angina, Valve disease, Heart Failure, Cardiac Arrhythmia, Pacemakers, and the ICD.   Cardiac Procedures: - Group verbal and written instruction and models to describe the testing methods done to diagnose heart disease. Reviews the outcomes of the test results. Describes the treatment choices: Medical Management, Angioplasty, or Coronary Bypass Surgery.   Cardiac Medications: - Group verbal and written instruction to review commonly prescribed medications for heart disease. Reviews the medication, class of the drug, and side effects. Includes the steps to properly store meds and maintain the prescription regimen.   Go Sex-Intimacy & Heart Disease, Get SMART - Goal Setting: - Group verbal and written instruction through game format to discuss heart disease and the return to sexual  intimacy. Provides group verbal and written material to discuss and apply goal setting through the application of the S.M.A.R.T. Method.   Other Matters of the Heart: - Provides group verbal, written materials and models to describe Heart Failure, Angina, Valve Disease, and Diabetes in the realm of heart disease. Includes description of the disease process and treatment options available to the cardiac patient.   Exercise & Equipment Safety: - Individual verbal instruction and demonstration of equipment use and safety with use of the equipment.   Infection Prevention: - Provides verbal and written material to individual with discussion of infection control including proper hand washing and proper equipment cleaning during exercise session.   Falls Prevention: - Provides verbal and written material to individual with discussion of falls prevention and safety.   Diabetes: - Individual verbal and written instruction to review signs/symptoms of diabetes, desired ranges of glucose level fasting, after meals and with exercise. Advice that pre and post exercise glucose checks will be done for 3 sessions at entry of program.    Knowledge Questionnaire Score:     Knowledge Questionnaire Score - 07/04/14 1151    Knowledge Questionnaire Score   Pre Score 23/28      Personal Goals and Risk Factors at Admission:     Personal Goals and Risk Factors at Admission - 07/31/14 1154    Personal Goals and Risk Factors on Admission  Weight Management Yes   Intervention Learn and follow the exercise and diet guidelines while in the program. Utilize the nutrition and education classes to help gain knowledge of the diet and exercise expectations in the program   Increase Aerobic Exercise and Physical Activity Yes   Intervention While in program, learn and follow the exercise prescription taught. Start at a low level workload and increase workload after able to maintain previous level for 30  minutes. Increase time before increasing intensity.   Understand more about Heart/Pulmonary Disease. Yes   Intervention While in program utilize professionals for any questions, and attend the education sessions. Great websites to use are www.americanheart.org or www.lung.org for reliable information.   Diabetes No   Hypertension Yes   Goal Participant will see blood pressure controlled within the values of 140/66m/Hg or within value directed by their physician.   Intervention Provide nutrition & aerobic exercise along with prescribed medications to achieve BP 140/90 or less.   Lipids No   Stress No      Personal Goals and Risk Factors Review:      Goals and Risk Factor Review      08/01/14 0940           Increase Aerobic Exercise and Physical Activity   Goals Progress/Improvement seen  Yes       Comments [place] [facility] [place] [facility] [facility].          Personal Goals Discharge:     Comments:30 day review

## 2014-08-03 DIAGNOSIS — I214 Non-ST elevation (NSTEMI) myocardial infarction: Secondary | ICD-10-CM | POA: Diagnosis not present

## 2014-08-03 NOTE — Progress Notes (Signed)
Daily Session Note  Patient Details  Name: Ruth Roth MRN: [id number] Date of Birth: [birth year] Referring Provider:  Einar Pheasant, MD  Encounter Date: 08/03/2014  Check In:     Session Check In - 08/03/14 0840    Check-In   Staff Present Heath Lark RN, BSN, CCRP;Thana Ramp BS, ACSM EP-C, Exercise Physiologist;Renee Dillard Essex MS, ACSM CEP Exercise Physiologist   ER physicians immediately available to respond to emergencies See telemetry face sheet for immediately available ER MD   Medication changes reported     No   Fall or balance concerns reported    No   Warm-up and Cool-down Performed on first and last piece of equipment   VAD Patient? No   Pain Assessment   Currently in Pain? No/denies         Goals Met:  Proper associated with RPD/PD & O2 Sat Exercise tolerated well No report of cardiac concerns or symptoms Strength training completed today  Goals Unmet:  Not Applicable  Goals Comments:    Dr. Emily Filbert is Medical Director for [place] and [name].

## 2014-08-04 ENCOUNTER — Other Ambulatory Visit: Payer: Self-pay | Admitting: Internal Medicine

## 2014-08-04 ENCOUNTER — Other Ambulatory Visit: Payer: Self-pay | Admitting: *Deleted

## 2014-08-04 DIAGNOSIS — I214 Non-ST elevation (NSTEMI) myocardial infarction: Secondary | ICD-10-CM

## 2014-08-05 ENCOUNTER — Encounter: Payer: Medicare Other | Admitting: *Deleted

## 2014-08-05 DIAGNOSIS — I214 Non-ST elevation (NSTEMI) myocardial infarction: Secondary | ICD-10-CM

## 2014-08-05 NOTE — Progress Notes (Signed)
Daily Session Note  Patient Details  Name: DELAYNEE ALRED MRN: [id number] Date of Birth: 08-Aug-1928 Referring Provider:  Einar Pheasant, MD  Encounter Date: 08/05/2014  Check In:     Session Check In - 08/05/14 0930    Check-In   Staff Present Heath Lark RN, BSN, CCRP;Renee Dillard Essex MS, ACSM CEP Exercise Physiologist;Carroll Enterkin RN, BSN   ER physicians immediately available to respond to emergencies See telemetry face sheet for immediately available ER MD   Medication changes reported     No   Fall or balance concerns reported    No   Warm-up and Cool-down Performed on first and last piece of equipment   VAD Patient? No   Pain Assessment   Currently in Pain? No/denies         Goals Met:  Independence with exercise equipment Exercise tolerated well No report of cardiac concerns or symptoms Strength training completed today  Goals Unmet:  Not Applicable  Goals Comments:    Dr. Emily Filbert is Medical Director for [place] and [name].

## 2014-08-10 ENCOUNTER — Encounter: Payer: Medicare Other | Attending: Cardiovascular Disease

## 2014-08-10 DIAGNOSIS — I214 Non-ST elevation (NSTEMI) myocardial infarction: Secondary | ICD-10-CM | POA: Diagnosis not present

## 2014-08-10 NOTE — Progress Notes (Signed)
Daily Session Note  Patient Details  Name: Ruth Roth MRN: [id number] Date of Birth: May 19, 1928 Referring Provider:  Einar Pheasant, MD  Encounter Date: 08/10/2014  Check In:     Session Check In - 08/10/14 0832    Check-In   Staff Present Heath Lark RN, BSN, CCRP;Yaseen Gilberg BS, ACSM EP-C, Exercise Physiologist;Renee Dillard Essex MS, ACSM CEP Exercise Physiologist   ER physicians immediately available to respond to emergencies See telemetry face sheet for immediately available ER MD   Medication changes reported     No   Fall or balance concerns reported    No   Warm-up and Cool-down Performed on first and last piece of equipment   VAD Patient? No   Pain Assessment   Currently in Pain? No/denies         Goals Met:  Proper associated with RPD/PD & O2 Sat Exercise tolerated well No report of cardiac concerns or symptoms Strength training completed today  Goals Unmet:  Not Applicable  Goals Comments:    Dr. Emily Filbert is Medical Director for [place] and [name].

## 2014-08-12 ENCOUNTER — Encounter: Payer: Medicare Other | Admitting: *Deleted

## 2014-08-12 DIAGNOSIS — I214 Non-ST elevation (NSTEMI) myocardial infarction: Secondary | ICD-10-CM

## 2014-08-12 NOTE — Progress Notes (Signed)
Daily Session Note  Patient Details  Name: Ruth Roth MRN: [id number] Date of Birth: 07-12-1928 Referring Provider:  Einar Pheasant, MD  Encounter Date: 08/12/2014  Check In:     Session Check In - 08/12/14 0956    Check-In   Staff Present Heath Lark RN, BSN, CCRP;Carroll Enterkin RN, Drusilla Kanner MS, ACSM CEP Exercise Physiologist   ER physicians immediately available to respond to emergencies See telemetry face sheet for immediately available ER MD   Medication changes reported     No   Fall or balance concerns reported    No   Warm-up and Cool-down Performed on first and last piece of equipment   VAD Patient? No   Pain Assessment   Currently in Pain? No/denies   Multiple Pain Sites No         Goals Met:  Independence with exercise equipment Exercise tolerated well No report of cardiac concerns or symptoms Strength training completed today  Goals Unmet:  Not Applicable  Goals Comments:     Dr. Emily Filbert is Medical Director for [place] and [name].

## 2014-08-15 ENCOUNTER — Encounter: Payer: Medicare Other | Admitting: *Deleted

## 2014-08-15 DIAGNOSIS — I214 Non-ST elevation (NSTEMI) myocardial infarction: Secondary | ICD-10-CM

## 2014-08-15 NOTE — Progress Notes (Signed)
Daily Session Note  Patient Details  Name: Ruth Roth MRN: [id number] Date of Birth: 06-Jan-1929 Referring Provider:  Einar Pheasant, MD  Encounter Date: 08/15/2014  Check In:     Session Check In - 08/15/14 0819    Check-In   Staff Present Heath Lark RN, BSN, CCRP;Renee Dillard Essex MS, ACSM CEP Exercise Physiologist;Elye Harmsen Alfonso Patten, ACSM CEP Exercise Physiologist   ER physicians immediately available to respond to emergencies See telemetry face sheet for immediately available ER MD   Medication changes reported     No   Fall or balance concerns reported    No   Warm-up and Cool-down Performed on first and last piece of equipment   VAD Patient? No   Pain Assessment   Currently in Pain? No/denies   Multiple Pain Sites No         Goals Met:  Independence with exercise equipment Exercise tolerated well No report of cardiac concerns or symptoms  Goals Unmet:  Not Applicable  Goals Comments:    Dr. Emily Filbert is Medical Director for [place] and [name].

## 2014-08-17 DIAGNOSIS — I214 Non-ST elevation (NSTEMI) myocardial infarction: Secondary | ICD-10-CM

## 2014-08-17 NOTE — Progress Notes (Signed)
Daily Session Note  Patient Details  Name: Ruth Roth MRN: [id number] Date of Birth: [birth year] Referring Provider:  Einar Pheasant, MD  Encounter Date: 08/17/2014  Check In:     Session Check In - 08/17/14 0856    Check-In   Staff Present Heath Lark RN, BSN, CCRP;Shervon Kerwin BS, ACSM EP-C, Exercise Physiologist;Renee Dillard Essex MS, ACSM CEP Exercise Physiologist   ER physicians immediately available to respond to emergencies See telemetry face sheet for immediately available ER MD   Medication changes reported     No   Fall or balance concerns reported    No   Warm-up and Cool-down Performed on first and last piece of equipment   VAD Patient? No   Pain Assessment   Currently in Pain? No/denies         Goals Met:  Proper associated with RPD/PD & O2 Sat Exercise tolerated well No report of cardiac concerns or symptoms Strength training completed today  Goals Unmet:  Not Applicable  Goals Comments:   Dr. Emily Filbert is Medical Director for [place] and [name].

## 2014-08-19 ENCOUNTER — Telehealth: Payer: Self-pay | Admitting: *Deleted

## 2014-08-19 ENCOUNTER — Encounter: Payer: Medicare Other | Admitting: *Deleted

## 2014-08-19 DIAGNOSIS — I214 Non-ST elevation (NSTEMI) myocardial infarction: Secondary | ICD-10-CM

## 2014-08-19 DIAGNOSIS — H4011X1 Primary open-angle glaucoma, mild stage: Secondary | ICD-10-CM | POA: Diagnosis not present

## 2014-08-19 NOTE — Telephone Encounter (Signed)
Pt c/o medication issue: Pt walked in and left message for nurse  1. Name of Medication: fluid pill or plavix   2. How are you currently taking this medication (dosage and times per day)? fliud pill 1 a day 20 mg plaxiv 1 day.   3. Are you having a reaction (difficulty breathing--STAT)? No. Just having a breaking out, skin gotten to dry and it just itches.   4. What is your medication issue? Loss of energy, itching, and just swelling on her eyes once in a while, headaches and just overall does not feel well.   She is going to [facility] today to check them out, pt is coming the 20th to see [place] but would like to know what to do in the mean time

## 2014-08-19 NOTE — Telephone Encounter (Signed)
Left message on machine for patient to contact the office.   

## 2014-08-19 NOTE — Telephone Encounter (Signed)
Pt is returning our call 

## 2014-08-19 NOTE — Telephone Encounter (Signed)
Spoke w/ pt.  Advised her of Dr. Tyrell Antonio recommendation.  She verbalizes understanding and will keep her appt on 08/29/14.

## 2014-08-19 NOTE — Telephone Encounter (Signed)
Stop Plavix.

## 2014-08-19 NOTE — Progress Notes (Signed)
Daily Session Note  Patient Details  Name: BRITTANYA WINBURN MRN: [id number] Date of Birth: [birth year] Referring Provider:  Einar Pheasant, MD  Encounter Date: 08/19/2014  Check In:     Session Check In - 08/19/14 0955    Check-In   Staff Present Gerlene Burdock RN, BSN;Loran Auguste Dillard Essex MS, ACSM CEP Exercise Physiologist;Mary Kellie Shropshire RN   ER physicians immediately available to respond to emergencies See telemetry face sheet for immediately available ER MD   Medication changes reported     No   Fall or balance concerns reported    No   Warm-up and Cool-down Performed on first and last piece of equipment   VAD Patient? No   Pain Assessment   Currently in Pain? No/denies   Multiple Pain Sites No         Goals Met:  Independence with exercise equipment Exercise tolerated well No report of cardiac concerns or symptoms Strength training completed today  Goals Unmet:  Not Applicable  Goals Comments:   Dr. Emily Filbert is Medical Director for [place] and [name].

## 2014-08-19 NOTE — Telephone Encounter (Addendum)
S/w patient who states that since she started taking plavix end of March and lasix, she has experienced generalized itching, swelling in her eyes, headaches and does not feel well daily. Denies swelling anywhere except eye area. Denies being short of breath with rest but states she gets SOB at cardiac rehab.  Pt feels her symptoms are being caused by the plavix. Has follow up appt.6/20 with Dr. Fletcher Anon but would like advice before then. Will Forward to Dr. Fletcher Anon.

## 2014-08-22 ENCOUNTER — Encounter: Payer: Medicare Other | Admitting: *Deleted

## 2014-08-22 DIAGNOSIS — I214 Non-ST elevation (NSTEMI) myocardial infarction: Secondary | ICD-10-CM

## 2014-08-22 NOTE — Progress Notes (Deleted)
Daily Session Note  Patient Details  Name: Ruth Roth MRN: [id number] Date of Birth: 12/12/1928 Referring Provider:  Einar Pheasant, MD  Encounter Date: 08/22/2014  Check In:     Session Check In - 08/22/14 1012    Check-In   Comments no longer taking plavix         Goals Met:  {CHL AMB CP REHAB GOALS MET:[id number]}  Goals Unmet:  {CHL AMB CP REHAB GOALS RKSYS:[id number]}  Goals Comments: ***   Dr. Emily Filbert is Medical Director for [place] and [name].

## 2014-08-22 NOTE — Progress Notes (Signed)
Daily Session Note  Patient Details  Name: Ruth Roth MRN: [id number] Date of Birth: 1928/07/21 Referring Provider:  Einar Pheasant, MD  Encounter Date: 08/22/2014  Check In:     Session Check In - 08/22/14 1015    Check-In   Staff Present Candiss Norse MS, ACSM CEP Exercise Physiologist;Susanne Bice RN, BSN, CCRP;Kinsey Cowsert BS, ACSM CEP Exercise Physiologist   Medication changes reported     Yes   Comments no longer taking plavix   Fall or balance concerns reported    No   Warm-up and Cool-down Performed on first and last piece of equipment   VAD Patient? No   Pain Assessment   Currently in Pain? No/denies   Multiple Pain Sites No         Goals Met:  Independence with exercise equipment Exercise tolerated well No report of cardiac concerns or symptoms  Goals Unmet:  Not Applicable  Goals Comments:    Dr. Emily Filbert is Medical Director for [facility] and [name].

## 2014-08-24 ENCOUNTER — Encounter: Payer: Self-pay | Admitting: *Deleted

## 2014-08-24 DIAGNOSIS — I214 Non-ST elevation (NSTEMI) myocardial infarction: Secondary | ICD-10-CM | POA: Diagnosis not present

## 2014-08-24 NOTE — Progress Notes (Signed)
Daily Session Note  Patient Details  Name: Ruth Roth MRN: [id number] Date of Birth: [birth year] Referring Provider:  Einar Pheasant, MD  Encounter Date: 08/24/2014  Check In:     Session Check In - 08/24/14 0839    Check-In   Staff Present Heath Lark RN, BSN, CCRP;Raylan Troiani BS, ACSM EP-C, Exercise Physiologist;Renee Dillard [place] MS, ACSM CEP Exercise Physiologist   ER physicians immediately available to respond to emergencies See telemetry face sheet for immediately available ER MD   Medication changes reported     No   Fall or balance concerns reported    No   Warm-up and Cool-down Performed on first and last piece of equipment   VAD Patient? No   Pain Assessment   Currently in Pain? No/denies         Goals Met:  Proper associated with RPD/PD & O2 Sat Exercise tolerated well No report of cardiac concerns or symptoms Strength training completed today  Goals Unmet:  Not Applicable  Goals Comments:    Dr. Emily Filbert is Medical Director for [name].

## 2014-08-26 ENCOUNTER — Encounter: Payer: Medicare Other | Admitting: *Deleted

## 2014-08-26 DIAGNOSIS — I214 Non-ST elevation (NSTEMI) myocardial infarction: Secondary | ICD-10-CM

## 2014-08-26 DIAGNOSIS — H4011X1 Primary open-angle glaucoma, mild stage: Secondary | ICD-10-CM | POA: Diagnosis not present

## 2014-08-26 NOTE — Progress Notes (Signed)
Cardiac Individual Treatment Plan  Patient Details  Name: Ruth Roth MRN: [id number] Date of Birth: [birth year] Referring Provider:  Einar Pheasant, MD  Initial Encounter Date:  07/04/2014  Visit Diagnosis: NSTEMI (non-ST elevated myocardial infarction)  Patient's Home Medications on Admission:  Current outpatient prescriptions:  .  amLODipine (NORVASC) 10 MG tablet, TAKE 1 TABLET BY MOUTH EVERY DAY, Disp: 90 tablet, Rfl: 1 .  aspirin 81 MG tablet, Take 81 mg by mouth daily.  , Disp: , Rfl:  .  furosemide (LASIX) 20 MG tablet, Take 1 tablet (20 mg total) by mouth daily., Disp: 30 tablet, Rfl: 5 .  gabapentin (NEURONTIN) 300 MG capsule, TAKE 2 CAPSULES BY MOUTH FOUR TIMES DAILY., Disp: 720 capsule, Rfl: 1 .  ipratropium (ATROVENT) 0.06 % nasal spray, Place 1 spray into both nostrils as needed. , Disp: , Rfl:  .  levothyroxine (SYNTHROID, LEVOTHROID) 112 MCG tablet, TAKE 1 TABLET BY MOUTH EVERY DAY BEFORE BREAKFAST, Disp: 90 tablet, Rfl: 3 .  metoprolol (LOPRESSOR) 50 MG tablet, Take 1 tablet (50 mg total) by mouth 2 (two) times daily., Disp: 180 tablet, Rfl: 1 .  omeprazole (PRILOSEC) 20 MG capsule, Take 1 capsule (20 mg total) by mouth 2 (two) times daily., Disp: 180 capsule, Rfl: 3 .  potassium chloride SA (K-DUR,KLOR-CON) 20 MEQ tablet, Take one tablet every other day., Disp: 30 tablet, Rfl: 1 .  Probiotic Product (ALIGN) 4 MG CAPS, Take 1 capsule by mouth daily., Disp: , Rfl:  .  traMADol (ULTRAM) 50 MG tablet, One daily prn, Disp: 60 tablet, Rfl: 1 .  ursodiol (ACTIGALL) 300 MG capsule, Take 300 mg by mouth. 2 morning - 1 night , Disp: , Rfl:   Past Medical History: Past Medical History  Diagnosis Date  . Hypertension   . 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 Fibrocystic breast disease   . Osteoarthritis   . Inflammatory arthritis   . Autoimmune hepatitis     followed by Dr [name]  . Neuropathy   . History of colon  polyps 2000  . MI (myocardial infarction)   . Thyroid disease   . CHF (congestive heart failure)   . Coronary artery disease     Non-ST elevation myocardial infarction in March 2016. Cardiac catheterization showed 60% ostial left circumflex hazy stenosis which was possibly the culprit. Mild LAD/RCA disease. EF 60% by echo.  . Hypercholesterolemia     Tobacco Use: History  Smoking status  . Never Smoker   Smokeless tobacco  . Never Used    Labs: Recent Review Flowsheet Data    Labs for ITP Cardiac and Pulmonary Rehab Latest Ref Rng 09/02/2012 12/17/2012 09/16/2013 10/12/2013 04/12/2014   Cholestrol 0 - 200 mg/dL 227(H) 221(H) 189 208(H) 201(H)   LDLCALC 0 - 99 mg/dL - - 107(H) 130(H) 113(H)   LDLDIRECT - 152.8 143.5 - - -   HDL >39.00 mg/dL 60.60 53.20 60.00 63.70 60.90   Trlycerides 0.0 - 149.0 mg/dL 128.0 169.0(H) 108.0 73.0 138.0       Exercise Target Goals:    Exercise Program Goal: Individual exercise prescription set with THRR, safety & activity barriers. Participant demonstrates ability to understand and report RPE using BORG scale, to self-measure pulse accurately, and to acknowledge the importance of the exercise prescription.  Exercise Prescription Goal: Starting with aerobic activity 30 plus minutes a day, 3 days per week for initial exercise prescription. Provide home exercise prescription and  guidelines that participant acknowledges understanding prior to discharge.  Activity Barriers & Risk Stratification:     Activity Barriers & Risk Stratification - 07/31/14 1151    Activity Barriers & Risk Stratification   Activity Barriers Joint Problems   Risk Stratification High      6 Minute Walk:     6 Minute Walk      07/04/14 1152       6 Minute Walk   Phase Initial     Distance 1180 feet     Walk Time 5.45 minutes     Resting HR 60 bpm     Resting BP 138/70 mmHg     Max Ex. HR 74 bpm     Max Ex. BP 142/40 mmHg     RPE 13     Symptoms Yes (comment)      Comments no symproms    stopped for 15 secs        Initial Exercise Prescription:   Exercise Prescription Changes:     Exercise Prescription Changes      07/27/14 0800 08/02/14 0800 08/10/14 0900 08/24/14 0800     Exercise Review   Progression Yes Yes Yes Yes    Response to Exercise   Blood Pressure (Admit)  136/80 mmHg      Blood Pressure (Exercise)  136/70 mmHg      Blood Pressure (Exit)  142/60 mmHg      Heart Rate (Admit)  72 bpm      Heart Rate (Exercise)  89 bpm      Heart Rate (Exit)  70 bpm      Rating of Perceived Exertion (Exercise)  12      Frequency   Add 1 additional day to program exercise sessions. Add 1 additional day to program exercise sessions.    Duration  Progress to 30 minutes of continuous aerobic without signs/symptoms of physical distress      Intensity  THRR unchanged      Progression  Continue progressive overload as per policy without signs/symptoms or physical distress.      Resistance Training   Training Prescription Yes Yes      Weight 2 2      Reps 10-12 10-12      Interval Training   Interval Training    Yes    Equipment    NuStep    Comments    L5-6    Treadmill   MPH  2.2 2.8 2.8    Grade  0 0 0    Minutes  15 15 15     NuStep   Level 3 4      Watts 45 45      Minutes  20      REL-XR   Level    3    Watts    45       Discharge Exercise Prescription:   Nutrition:  Target Goals: Understanding of nutrition guidelines, daily intake of sodium <1544m, cholesterol <2083m calories 30% from fat and 7% or less from saturated fats, daily to have 5 or more servings of fruits and vegetables.  Biometrics:     Pre Biometrics - 07/04/14 1154    Pre Biometrics   Height 5' 2"  (1.575 m)   Weight 142 lb 14.4 oz (64.819 kg)   Waist Circumference 31.5 inches   Hip Circumference 39.75 inches   Waist to Hip Ratio 0.79 %   BMI (Calculated) 26.2       Nutrition Therapy  Plan and Nutrition Goals:     Nutrition Therapy & Goals - 08/03/14  0920    Nutrition Therapy   Diet 1300 calorie meal plan, DASH diet principles   Fiber 20 grams   Whole Grain Foods 3 servings   Protein 6 ounces/day   Saturated Fats 9 max. grams   Fruits and Vegetables 5 servings/day   Personal Nutrition Goals   Personal Goal #1 Include at least 5 servings of fruits/vegetables/day   Personal Goal #2 Increase whole grains through brown rice, 100% whole wheat pasta, corn and ww sandwich thins      Nutrition Discharge: Rate Your Plate Scores:     Rate Your Plate - 63/89/37 3428    Rate Your Plate Scores   Pre Score 58   Pre Score % 69 %      Nutrition Goals Re-Evaluation:     Nutrition Goals Re-Evaluation      08/01/14 0939 08/24/14 1111         Personal Goal #1 Re-Evaluation   Personal Goal #1 Eats healthier since she started Cardiac Rehab she reports. Include at least 5 servings of fruits/vegetables/day      Goal Progress Seen  Yes      Comments  Continues to follow guidelines      Personal Goal #2 Re-Evaluation   Personal Goal #2  Increase whole grains through brown rice, 100% whole wheat pasta, corn and ww sandwich thins      Goal Progress Seen  Yes      Comments  Continues to follow guidelines         Psychosocial: Target Goals: Acknowledge presence or absence of depression, maximize coping skills, provide positive support system. Participant is able to verbalize types and ability to use techniques and skills needed for reducing stress and depression.  Initial Review & Psychosocial Screening:     Initial Psych Review & Screening - 07/20/14 [place]? No   Concerns No support system  Ms. Sampedro has a very limited support system with a daughter who lives 30 miles away who seldom visits and a son in [place] area.  Her spouse passed 9 years ago.   Barriers   Psychosocial barriers to participate in program There are no identifiable barriers or psychosocial needs.;The patient should benefit from  training in stress management and relaxation.   Screening Interventions   Interventions Encouraged to exercise      Quality of Life Scores:   PHQ-9:     Recent Review Flowsheet Data    Depression screen [facility] 2/9 09/09/2013 09/07/2012 05/10/2012 05/01/2012   Decreased Interest 0 0 0 0   Down, Depressed, Hopeless 0 0 0 0   PHQ - 2 Score 0 0 0 0      Psychosocial Evaluation and Intervention:     Psychosocial Evaluation - 07/20/14 0957    Psychosocial Evaluation & Interventions   Interventions Stress management education;Encouraged to exercise with the program and follow exercise prescription;Relaxation education   Comments Counselor met with Ms. Butler today for initial psychosocial evaluation.  She is a spunky 81 year old who has minimal health issues and reports sleeping and eating well.  She also denies a history of depression or current symptoms.  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  As a result she has a limited support system here locally other than her  spouse's family stay in touch and she is part of a faith community.  Ms. Apperson reports minimal stress in her life currently and is generally in a positive mood most of the time.  Her goals for this program are to increase her energy and stamina and to exercise consistently.     Continued Psychosocial Services Needed --  Ms. Pounds will benefit from all of the psychoeducational components of this program, as well as finding a follow up group to exercise with in order to maintain any progress made in this program and to increase her support system.      Psychosocial Re-Evaluation:   Vocational Rehabilitation: Provide vocational rehab assistance to qualifying candidates.   Vocational Rehab Evaluation & Intervention:     Vocational Rehab - 07/31/14 1152    Initial Vocational Rehab Evaluation & Intervention   Assessment shows need for Vocational  Rehabilitation No      Education: Education Goals: Education classes will be provided on a weekly basis, covering required topics. Participant will state understanding/return demonstration of topics presented.  Learning Barriers/Preferences:     Learning Barriers/Preferences - 07/31/14 1151    Learning Barriers/Preferences   Learning Barriers None   Learning Preferences Video      Education Topics: General Nutrition Guidelines/Fats and Fiber: -Group instruction provided by verbal, written material, models and posters to present the general guidelines for heart healthy nutrition. Gives an explanation and review of dietary fats and fiber.   Controlling Sodium/Reading Food Labels: -Group verbal and written material supporting the discussion of sodium use in heart healthy nutrition. Review and explanation with models, verbal and written materials for utilization of the food label.   Exercise Physiology & Risk Factors: - Group verbal and written instruction with models to review the exercise physiology of the cardiovascular system and associated critical values. Details cardiovascular disease risk factors and the goals associated with each risk factor.   Aerobic Exercise & Resistance Training: - Gives group verbal and written discussion on the health impact of inactivity. On the components of aerobic and resistive training programs and the benefits of this training and how to safely progress through these programs.   Flexibility, Balance, General Exercise Guidelines: - Provides group verbal and written instruction on the benefits of flexibility and balance training programs. Provides general exercise guidelines with specific guidelines to those with heart or lung disease. Demonstration and skill practice provided.   Stress Management: - Provides group verbal and written instruction about the health risks of elevated stress, cause of high stress, and healthy ways to reduce  stress.   Depression: - Provides group verbal and written instruction on the correlation between heart/lung disease and depressed mood, treatment options, and the stigmas associated with seeking treatment.   Anatomy & Physiology of the Heart: - Group verbal and written instruction and models provide basic cardiac anatomy and physiology, with the coronary electrical and arterial systems. Review of: AMI, Angina, Valve disease, Heart Failure, Cardiac Arrhythmia, Pacemakers, and the ICD.   Cardiac Procedures: - Group verbal and written instruction and models to describe the testing methods done to diagnose heart disease. Reviews the outcomes of the test results. Describes the treatment choices: Medical Management, Angioplasty, or Coronary Bypass Surgery.   Cardiac Medications: - Group verbal and written instruction to review commonly prescribed medications for heart disease. Reviews the medication, class of the drug, and side effects. Includes the steps to properly store meds and maintain the prescription regimen.   Go Sex-Intimacy & Heart Disease, Get  SMART - Goal Setting: - Group verbal and written instruction through game format to discuss heart disease and the return to sexual intimacy. Provides group verbal and written material to discuss and apply goal setting through the application of the S.M.A.R.T. Method.   Other Matters of the Heart: - Provides group verbal, written materials and models to describe Heart Failure, Angina, Valve Disease, and Diabetes in the realm of heart disease. Includes description of the disease process and treatment options available to the cardiac patient.   Exercise & Equipment Safety: - Individual verbal instruction and demonstration of equipment use and safety with use of the equipment.   Infection Prevention: - Provides verbal and written material to individual with discussion of infection control including proper hand washing and proper equipment  cleaning during exercise session.   Falls Prevention: - Provides verbal and written material to individual with discussion of falls prevention and safety.   Diabetes: - Individual verbal and written instruction to review signs/symptoms of diabetes, desired ranges of glucose level fasting, after meals and with exercise. Advice that pre and post exercise glucose checks will be done for 3 sessions at entry of program.    Knowledge Questionnaire Score:     Knowledge Questionnaire Score - 07/04/14 1151    Knowledge Questionnaire Score   Pre Score 23/28      Personal Goals and Risk Factors at Admission:     Personal Goals and Risk Factors at Admission - 07/31/14 1154    Personal Goals and Risk Factors on Admission    Weight Management Yes   Intervention Learn and follow the exercise and diet guidelines while in the program. Utilize the nutrition and education classes to help gain knowledge of the diet and exercise expectations in the program   Increase Aerobic Exercise and Physical Activity Yes   Intervention While in program, learn and follow the exercise prescription taught. Start at a low level workload and increase workload after able to maintain previous level for 30 minutes. Increase time before increasing intensity.   Understand more about Heart/Pulmonary Disease. Yes   Intervention While in program utilize professionals for any questions, and attend the education sessions. Great websites to use are www.americanheart.org or www.lung.org for reliable information.   Diabetes No   Hypertension Yes   Goal Participant will see blood pressure controlled within the values of 140/52m/Hg or within value directed by their physician.   Intervention Provide nutrition & aerobic exercise along with prescribed medications to achieve BP 140/90 or less.   Lipids No   Stress No      Personal Goals and Risk Factors Review:      Goals and Risk Factor Review      08/01/14 0940 08/24/14 1111          Weight Management   Goals Progress/Improvement seen  Yes      Comments  MAintaining weight as desired. Has seen some decrease in inches.      Increase Aerobic Exercise and Physical Activity   Goals Progress/Improvement seen  Yes Yes      [place]. Continues to improve with workloads.      Hypertension   Progress seen toward goals  Yes      Comments  BP remains controlled.         Personal Goals Discharge:     Comments:

## 2014-08-26 NOTE — Progress Notes (Signed)
Daily Session Note  Patient Details  Name: Ruth Roth MRN: [id number] Date of Birth: 10/27/1928 Referring Provider:  Einar Pheasant, MD  Encounter Date: 08/26/2014  Check In:     Session Check In - 08/26/14 0944    Check-In   Staff Present Heath Lark RN, BSN, CCRP;Carroll Enterkin RN, Drusilla Kanner MS, ACSM CEP Exercise Physiologist   ER physicians immediately available to respond to emergencies See telemetry face sheet for immediately available ER MD   Medication changes reported     No   Fall or balance concerns reported    No   Warm-up and Cool-down Performed on first and last piece of equipment   VAD Patient? No   Pain Assessment   Currently in Pain? No/denies   Multiple Pain Sites No         Goals Met:  Independence with exercise equipment Exercise tolerated well No report of cardiac concerns or symptoms Strength training completed today  Goals Unmet:  Not Applicable  Goals Comments:   Dr. Emily Filbert is Medical Director for [name].

## 2014-08-29 ENCOUNTER — Encounter: Payer: Medicare Other | Admitting: *Deleted

## 2014-08-29 ENCOUNTER — Encounter: Payer: Self-pay | Admitting: *Deleted

## 2014-08-29 ENCOUNTER — Ambulatory Visit (INDEPENDENT_AMBULATORY_CARE_PROVIDER_SITE_OTHER): Payer: Medicare Other | Admitting: Cardiovascular Disease

## 2014-08-29 ENCOUNTER — Encounter: Payer: Self-pay | Admitting: Cardiovascular Disease

## 2014-08-29 VITALS — BP 140/62 | HR 58 | Ht 62.0 in | Wt 143.8 lb

## 2014-08-29 DIAGNOSIS — I214 Non-ST elevation (NSTEMI) myocardial infarction: Secondary | ICD-10-CM | POA: Diagnosis not present

## 2014-08-29 DIAGNOSIS — I10 Essential (primary) hypertension: Secondary | ICD-10-CM

## 2014-08-29 DIAGNOSIS — I5032 Chronic diastolic (congestive) heart failure: Secondary | ICD-10-CM

## 2014-08-29 DIAGNOSIS — E7800 Pure hypercholesterolemia, unspecified: Secondary | ICD-10-CM

## 2014-08-29 DIAGNOSIS — I2510 Atherosclerotic heart disease of native coronary artery without angina pectoris: Secondary | ICD-10-CM

## 2014-08-29 NOTE — Assessment & Plan Note (Signed)
She is doing well overall with no symptoms suggestive of angina. Continue medical therapy. Given that there was no stent placed, dual antiplatelets therapy is optional and I decided not to replace Plavix with something else.

## 2014-08-29 NOTE — Assessment & Plan Note (Signed)
She appears to be euvolemic on small dose furosemide.

## 2014-08-29 NOTE — Progress Notes (Signed)
Primary care physician: Dr. Einar Pheasant  HPI  This is a pleasant 81 year old female who is here today for follow-up visit regarding coronary artery disease . She has known history of hypertension, peptic ulcer disease, fibrocystic breast disease, inflammatory arthritis, autoimmune hepatitis and neuropathy. She presented to [facility] in March with shortness of breath and 12 pound weight gain after a GI illness. She was noted to be fluid overloaded. BNP was elevated. Troponin was mildly elevated. She improved with diuresis. Peak troponin was 1.2. Echocardiogram showed normal LV systolic function. Cardiac catheterization showed showed 60% ostial left circumflex hazy stenosis which was possibly the culprit. Mild LAD/RCA disease. I recommended medical therapy.  She was placed on Plavix but developed a generalized rash and thus this medication was discontinued. She has been attending cardiac rehabilitation and reports no chest pain or significant dyspnea.  Allergies  Allergen Reactions  . Altace [Ramipril]   . Librax [Chlordiazepoxide-Clidinium]   . Statins     Affect her liver     Current Outpatient Prescriptions on File Prior to Visit  Medication Sig Dispense Refill  . amLODipine (NORVASC) 10 MG tablet TAKE 1 TABLET BY MOUTH EVERY DAY 90 tablet 1  . aspirin 81 MG tablet Take 81 mg by mouth daily.      . furosemide (LASIX) 20 MG tablet Take 1 tablet (20 mg total) by mouth daily. 30 tablet 5  . gabapentin (NEURONTIN) 300 MG capsule TAKE 2 CAPSULES BY MOUTH FOUR TIMES DAILY. 720 capsule 1  . ipratropium (ATROVENT) 0.06 % nasal spray Place 1 spray into both nostrils as needed.     Marland Kitchen levothyroxine (SYNTHROID, LEVOTHROID) 112 MCG tablet TAKE 1 TABLET BY MOUTH EVERY DAY BEFORE BREAKFAST 90 tablet 3  . metoprolol (LOPRESSOR) 50 MG tablet Take 1 tablet (50 mg total) by mouth 2 (two) times daily. 180 tablet 1  . omeprazole (PRILOSEC) 20 MG capsule Take 1 capsule (20 mg total) by mouth 2 (two) times daily.  180 capsule 3  . potassium chloride SA (K-DUR,KLOR-CON) 20 MEQ tablet Take one tablet every other day. 30 tablet 1  . Probiotic Product (ALIGN) 4 MG CAPS Take 1 capsule by mouth daily.    . traMADol (ULTRAM) 50 MG tablet One daily prn 60 tablet 1  . ursodiol (ACTIGALL) 300 MG capsule Take 300 mg by mouth. 2 morning - 1 night      No current facility-administered medications on file prior to visit.     Past Medical History  Diagnosis Date  . Hypertension   . 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 Fibrocystic breast disease   . Osteoarthritis   . Inflammatory arthritis   . Autoimmune hepatitis     followed by Dr [name]  . Neuropathy   . History of colon polyps 2000  . MI (myocardial infarction)   . Thyroid disease   . CHF (congestive heart failure)   . Coronary artery disease     Non-ST elevation myocardial infarction in March 2016. Cardiac catheterization showed 60% ostial left circumflex hazy stenosis which was possibly the culprit. Mild LAD/RCA disease. EF 60% by echo.  . Hypercholesterolemia      Past Surgical History  Procedure Laterality Date  . Appendectomy    . Billroth    . Colonoscopy  2009  . Cholecystectomy  1998  . Upper gi endoscopy  2004  . Abdominal hysterectomy  1963    partial, secondary to fibroids  .  Cardiac catheterization  05/12/14    [facility]     Family History  Problem Relation Age of Onset  . Stroke Mother   . Esophageal cancer Brother     also had lung cancer  . Breast cancer Neg Hx   . Colon cancer Neg Hx      History   Social History  . Marital Status: Widowed    Spouse Name: N/A  . Number of Children: N/A  . Years of Education: N/A   Occupational History  . Not on file.   Social History Main Topics  . Smoking status: Never Smoker   . Smokeless tobacco: Never Used  . Alcohol Use: No  . Drug Use: No  . Sexual Activity: Not on file   Other Topics Concern  . Not on  file   Social History Narrative      PHYSICAL EXAM   BP 140/62 mmHg  Pulse 58  Ht 5\' 2"  (1.575 m)  Wt 143 lb 12 oz (65.205 kg)  BMI 26.29 kg/m2 Constitutional: She is oriented to person, place, and time. She appears well-developed and well-nourished. No distress.  HENT: No nasal discharge.  Head: Normocephalic and atraumatic.  Eyes: Pupils are equal and round. No discharge.  Neck: Normal range of motion. Neck supple. No JVD present. No thyromegaly present.  Cardiovascular: Normal rate, regular rhythm, normal heart sounds. Exam reveals no gallop and no friction rub. No murmur heard.  Pulmonary/Chest: Effort normal and breath sounds normal. No stridor. No respiratory distress. She has no wheezes. She has no rales. She exhibits no tenderness.  Abdominal: Soft. Bowel sounds are normal. She exhibits no distension. There is no tenderness. There is no rebound and no guarding.  Musculoskeletal: Normal range of motion. She exhibits no edema and no tenderness.  Neurological: She is alert and oriented to person, place, and time. Coordination normal.  Skin: Skin is warm and dry. No rash noted. She is not diaphoretic. No erythema. No pallor.  Psychiatric: She has a normal mood and affect. Her behavior is normal. Judgment and thought content normal.     ASSESSMENT AND PLAN

## 2014-08-29 NOTE — Assessment & Plan Note (Signed)
Blood pressure is reasonably controlled on current medications. 

## 2014-08-29 NOTE — Progress Notes (Signed)
Daily Session Note  Patient Details  Name: Ruth Roth MRN: [id number] Date of Birth: 1928/11/18 Referring Provider:  Einar Pheasant, MD  Encounter Date: 08/29/2014  Check In:     Session Check In - 08/29/14 0847    Check-In   Staff Present Heath Lark RN, BSN, CCRP;Kelly Hayes BS, ACSM CEP Exercise Physiologist;Aaliyah Gavel RN, BSN   ER physicians immediately available to respond to emergencies See telemetry face sheet for immediately available ER MD   Medication changes reported     No   Fall or balance concerns reported    No   Warm-up and Cool-down Performed on first and last piece of equipment   VAD Patient? No   Pain Assessment   Currently in Pain? No/denies         Goals Met:  Proper associated with RPD/PD & O2 Sat Exercise tolerated well  Goals Unmet:  Not Applicable  Goals Comments:    Dr. Emily Filbert is Medical Director for [facility] and [name].

## 2014-08-29 NOTE — Assessment & Plan Note (Signed)
She has known history of autoimmune hepatitis with previous reaction possibly to simvastatin. Due to that, the risks of treatment with a statin might outweigh the benefits in this situation.

## 2014-08-29 NOTE — Patient Instructions (Signed)
Medication Instructions: Continue same medications.   Labwork: None.   Procedures/Testing: None.   Follow-Up: 6 months with Dr. Arida.   Any Additional Special Instructions Will Be Listed Below (If Applicable).   

## 2014-08-30 ENCOUNTER — Encounter: Payer: Self-pay | Admitting: *Deleted

## 2014-08-30 DIAGNOSIS — I214 Non-ST elevation (NSTEMI) myocardial infarction: Secondary | ICD-10-CM

## 2014-08-30 NOTE — Progress Notes (Signed)
Cardiac Individual Treatment Plan  Patient Details  Name: Ruth Roth MRN: [id number] Date of Birth: [birth year] Referring Provider:  Dr. Rod Can Initial Encounter Date:  07/04/2014  Visit Diagnosis: NSTEMI (non-ST elevated myocardial infarction)  Patient's Home Medications on Admission:  Current outpatient prescriptions:  .  amLODipine (NORVASC) 10 MG tablet, TAKE 1 TABLET BY MOUTH EVERY DAY, Disp: 90 tablet, Rfl: 1 .  aspirin 81 MG tablet, Take 81 mg by mouth daily.  , Disp: , Rfl:  .  furosemide (LASIX) 20 MG tablet, Take 1 tablet (20 mg total) by mouth daily., Disp: 30 tablet, Rfl: 5 .  gabapentin (NEURONTIN) 300 MG capsule, TAKE 2 CAPSULES BY MOUTH FOUR TIMES DAILY., Disp: 720 capsule, Rfl: 1 .  ipratropium (ATROVENT) 0.06 % nasal spray, Place 1 spray into both nostrils as needed. , Disp: , Rfl:  .  levothyroxine (SYNTHROID, LEVOTHROID) 112 MCG tablet, TAKE 1 TABLET BY MOUTH EVERY DAY BEFORE BREAKFAST, Disp: 90 tablet, Rfl: 3 .  metoprolol (LOPRESSOR) 50 MG tablet, Take 1 tablet (50 mg total) by mouth 2 (two) times daily., Disp: 180 tablet, Rfl: 1 .  omeprazole (PRILOSEC) 20 MG capsule, Take 1 capsule (20 mg total) by mouth 2 (two) times daily., Disp: 180 capsule, Rfl: 3 .  potassium chloride SA (K-DUR,KLOR-CON) 20 MEQ tablet, Take one tablet every other day., Disp: 30 tablet, Rfl: 1 .  Probiotic Product (ALIGN) 4 MG CAPS, Take 1 capsule by mouth daily., Disp: , Rfl:  .  traMADol (ULTRAM) 50 MG tablet, One daily prn, Disp: 60 tablet, Rfl: 1 .  ursodiol (ACTIGALL) 300 MG capsule, Take 300 mg by mouth. 2 morning - 1 night , Disp: , Rfl:   Past Medical History: Past Medical History  Diagnosis Date  . Hypertension   . 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 Fibrocystic breast disease   . Osteoarthritis   . Inflammatory arthritis   . Autoimmune hepatitis     followed by Dr [name]  . Neuropathy   . History of colon polyps  2000  . MI (myocardial infarction)   . Thyroid disease   . CHF (congestive heart failure)   . Coronary artery disease     Non-ST elevation myocardial infarction in March 2016. Cardiac catheterization showed 60% ostial left circumflex hazy stenosis which was possibly the culprit. Mild LAD/RCA disease. EF 60% by echo.  . Hypercholesterolemia     Tobacco Use: History  Smoking status  . Never Smoker   Smokeless tobacco  . Never Used    Labs: Recent Review Flowsheet Data    Labs for ITP Cardiac and Pulmonary Rehab Latest Ref Rng 09/02/2012 12/17/2012 09/16/2013 10/12/2013 04/12/2014   Cholestrol 0 - 200 mg/dL 227(H) 221(H) 189 208(H) 201(H)   LDLCALC 0 - 99 mg/dL - - 107(H) 130(H) 113(H)   LDLDIRECT - 152.8 143.5 - - -   HDL >39.00 mg/dL 60.60 53.20 60.00 63.70 60.90   Trlycerides 0.0 - 149.0 mg/dL 128.0 169.0(H) 108.0 73.0 138.0       Exercise Target Goals:    Exercise Program Goal: Individual exercise prescription set with THRR, safety & activity barriers. Participant demonstrates ability to understand and report RPE using BORG scale, to self-measure pulse accurately, and to acknowledge the importance of the exercise prescription.  Exercise Prescription Goal: Starting with aerobic activity 30 plus minutes a day, 3 days per week for initial exercise prescription. Provide home exercise prescription and guidelines  that participant acknowledges understanding prior to discharge.  Activity Barriers & Risk Stratification:     Activity Barriers & Risk Stratification - 07/31/14 1151    Activity Barriers & Risk Stratification   Activity Barriers Joint Problems   Risk Stratification High      6 Minute Walk:     6 Minute Walk      07/04/14 1152       6 Minute Walk   Phase Initial     Distance 1180 feet     Walk Time 5.45 minutes     Resting HR 60 bpm     Resting BP 138/70 mmHg     Max Ex. HR 74 bpm     Max Ex. BP 142/40 mmHg     RPE 13     Symptoms Yes (comment)     Comments  no symproms    stopped for 15 secs        Initial Exercise Prescription:   Exercise Prescription Changes:     Exercise Prescription Changes      07/27/14 0800 08/02/14 0800 08/10/14 0900 08/24/14 0800 08/29/14 1600   Exercise Review   Progression Yes Yes Yes Yes Yes   Response to Exercise   Blood Pressure (Admit)  136/80 mmHg   122/60 mmHg   Blood Pressure (Exercise)  136/70 mmHg   162/70 mmHg   Blood Pressure (Exit)  142/60 mmHg   138/60 mmHg   Heart Rate (Admit)  72 bpm   67 bpm   Heart Rate (Exercise)  89 bpm   78 bpm   Heart Rate (Exit)  70 bpm   59 bpm   Rating of Perceived Exertion (Exercise)  12   11   Symptoms     No   Frequency   Add 1 additional day to program exercise sessions. Add 1 additional day to program exercise sessions.    Duration  Progress to 30 minutes of continuous aerobic without signs/symptoms of physical distress   Progress to 50 minutes of aerobic without signs/symptoms of physical distress   Intensity  THRR unchanged   Rest + 30   Progression  Continue progressive overload as per policy without signs/symptoms or physical distress.   Continue progressive overload as per policy without signs/symptoms or physical distress.   Resistance Training   Training Prescription Yes Yes   Yes   Weight 2 2   2  lb   Reps 10-12 10-12   10-15   Interval Training   Interval Training    Yes Yes   Equipment    NuStep NuStep   Comments    L5-6 L5-6   Treadmill   MPH  2.2 2.8 2.8 2.5   Grade  0 0 0 2   Minutes  15 15 15 15    NuStep   Level 3 4   6    Watts 45 45   60   Minutes  20   20   REL-XR   Level    3 3   Watts    45 45   Minutes     15      Discharge Exercise Prescription (Final Exercise Prescription Changes):     Exercise Prescription Changes - 08/29/14 1600    Exercise Review   Progression Yes   Response to Exercise   Blood Pressure (Admit) 122/60 mmHg   Blood Pressure (Exercise) 162/70 mmHg   Blood Pressure (Exit) 138/60 mmHg   Heart Rate  (Admit) 67 bpm   Heart  Rate (Exercise) 78 bpm   Heart Rate (Exit) 59 bpm   Rating of Perceived Exertion (Exercise) 11   Symptoms No   Duration Progress to 50 minutes of aerobic without signs/symptoms of physical distress   Intensity Rest + 30   Progression Continue progressive overload as per policy without signs/symptoms or physical distress.   Resistance Training   Training Prescription Yes   Weight 2 lb   Reps 10-15   Interval Training   Interval Training Yes   Equipment NuStep   Comments L5-6   Treadmill   MPH 2.5   Grade 2   Minutes 15   NuStep   Level 6   Watts 60   Minutes 20   REL-XR   Level 3   Watts 45   Minutes 15      Nutrition:  Target Goals: Understanding of nutrition guidelines, daily intake of sodium <1550m, cholesterol <2058m calories 30% from fat and 7% or less from saturated fats, daily to have 5 or more servings of fruits and vegetables.  Biometrics:     Pre Biometrics - 07/04/14 1154    Pre Biometrics   Height 5' 2"  (1.575 m)   Weight 142 lb 14.4 oz (64.819 kg)   Waist Circumference 31.5 inches   Hip Circumference 39.75 inches   Waist to Hip Ratio 0.79 %   BMI (Calculated) 26.2       Nutrition Therapy Plan and Nutrition Goals:     Nutrition Therapy & Goals - 08/03/14 0920    Nutrition Therapy   Diet 1300 calorie meal plan, DASH diet principles   Fiber 20 grams   Whole Grain Foods 3 servings   Protein 6 ounces/day   Saturated Fats 9 max. grams   Fruits and Vegetables 5 servings/day   Personal Nutrition Goals   Personal Goal #1 Include at least 5 servings of fruits/vegetables/day   Personal Goal #2 Increase whole grains through brown rice, 100% whole wheat pasta, corn and ww sandwich thins      Nutrition Discharge: Rate Your Plate Scores:     Rate Your Plate - 0562/03/5599741  Rate Your Plate Scores   Pre Score 58   Pre Score % 69 %      Nutrition Goals Re-Evaluation:     Nutrition Goals Re-Evaluation      08/01/14  0939 08/24/14 1111 08/30/14 1053       Personal Goal #1 Re-Evaluation   Personal Goal #1 Eats healthier since she started Cardiac Rehab she reports. Include at least 5 servings of fruits/vegetables/day Include at least 5 servings of fruits/vegetables/day     Goal Progress Seen  Yes      Comments  Continues to follow guidelines Continues to follow guidelines     Personal Goal #2 Re-Evaluation   Personal Goal #2  Increase whole grains through brown rice, 100% whole wheat pasta, corn and ww sandwich thins Increase whole grains through brown rice, 100% whole wheat pasta, corn and ww sandwich thins     Goal Progress Seen  Yes Yes     Comments  Continues to follow guidelines Continues to follow guidelines        Psychosocial: Target Goals: Acknowledge presence or absence of depression, maximize coping skills, provide positive support system. Participant is able to verbalize types and ability to use techniques and skills needed for reducing stress and depression.  Initial Review & Psychosocial Screening:     Initial Psych Review & Screening - 07/20/14 [id number]  Family Dynamics   Good Support System? No   Concerns No support system  Ms. Lish has a very limited support system with a daughter who lives 30 miles away who seldom visits and a son in [place] area.  Her spouse passed 9 years ago.   Barriers   Psychosocial barriers to participate in program There are no identifiable barriers or psychosocial needs.;The patient should benefit from training in stress management and relaxation.   Screening Interventions   Interventions Encouraged to exercise      Quality of Life Scores:   PHQ-9:     Recent Review Flowsheet Data    Depression screen [facility] 2/9 09/09/2013 09/07/2012 05/10/2012 05/01/2012   Decreased Interest 0 0 0 0   Down, Depressed, Hopeless 0 0 0 0   PHQ - 2 Score 0 0 0 0      Psychosocial Evaluation and Intervention:     Psychosocial Evaluation - 07/20/14 0957    Psychosocial  Evaluation & Interventions   Interventions Stress management education;Encouraged to exercise with the program and follow exercise prescription;Relaxation education   Comments Counselor met with Ms. Nazir today for initial psychosocial evaluation.  She is a spunky 82 year old who has minimal health issues and reports sleeping and eating well.  She also denies a history of depression or current symptoms.  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  As a result she has a limited support system here locally other than her spouse's family stay in touch and she is part of a faith community.  Ms. Marshman reports minimal stress in her life currently and is generally in a positive mood most of the time.  Her goals for this program are to increase her energy and stamina and to exercise consistently.     Continued Psychosocial Services Needed --  Ms. Baltazar will benefit from all of the psychoeducational components of this program, as well as finding a follow up group to exercise with in order to maintain any progress made in this program and to increase her support system.      Psychosocial Re-Evaluation:   Vocational Rehabilitation: Provide vocational rehab assistance to qualifying candidates.   Vocational Rehab Evaluation & Intervention:     Vocational Rehab - 07/31/14 1152    Initial Vocational Rehab Evaluation & Intervention   Assessment shows need for Vocational Rehabilitation No      Education: Education Goals: Education classes will be provided on a weekly basis, covering required topics. Participant will state understanding/return demonstration of topics presented.  Learning Barriers/Preferences:     Learning Barriers/Preferences - 07/31/14 1151    Learning Barriers/Preferences   Learning Barriers None   Learning Preferences Video      Education Topics: General Nutrition Guidelines/Fats and Fiber: -Group  instruction provided by verbal, written material, models and posters to present the general guidelines for heart healthy nutrition. Gives an explanation and review of dietary fats and fiber.   Controlling Sodium/Reading Food Labels: -Group verbal and written material supporting the discussion of sodium use in heart healthy nutrition. Review and explanation with models, verbal and written materials for utilization of the food label.   Exercise Physiology & Risk Factors: - Group verbal and written instruction with models to review the exercise physiology of the cardiovascular system and associated critical values. Details cardiovascular disease risk factors and the goals associated with each risk factor.   Aerobic Exercise &  Resistance Training: - Gives group verbal and written discussion on the health impact of inactivity. On the components of aerobic and resistive training programs and the benefits of this training and how to safely progress through these programs.   Flexibility, Balance, General Exercise Guidelines: - Provides group verbal and written instruction on the benefits of flexibility and balance training programs. Provides general exercise guidelines with specific guidelines to those with heart or lung disease. Demonstration and skill practice provided.   Stress Management: - Provides group verbal and written instruction about the health risks of elevated stress, cause of high stress, and healthy ways to reduce stress.   Depression: - Provides group verbal and written instruction on the correlation between heart/lung disease and depressed mood, treatment options, and the stigmas associated with seeking treatment.   Anatomy & Physiology of the Heart: - Group verbal and written instruction and models provide basic cardiac anatomy and physiology, with the coronary electrical and arterial systems. Review of: AMI, Angina, Valve disease, Heart Failure, Cardiac Arrhythmia, Pacemakers,  and the ICD.   Cardiac Procedures: - Group verbal and written instruction and models to describe the testing methods done to diagnose heart disease. Reviews the outcomes of the test results. Describes the treatment choices: Medical Management, Angioplasty, or Coronary Bypass Surgery.   Cardiac Medications: - Group verbal and written instruction to review commonly prescribed medications for heart disease. Reviews the medication, class of the drug, and side effects. Includes the steps to properly store meds and maintain the prescription regimen.   Go Sex-Intimacy & Heart Disease, Get SMART - Goal Setting: - Group verbal and written instruction through game format to discuss heart disease and the return to sexual intimacy. Provides group verbal and written material to discuss and apply goal setting through the application of the S.M.A.R.T. Method.   Other Matters of the Heart: - Provides group verbal, written materials and models to describe Heart Failure, Angina, Valve Disease, and Diabetes in the realm of heart disease. Includes description of the disease process and treatment options available to the cardiac patient.   Exercise & Equipment Safety: - Individual verbal instruction and demonstration of equipment use and safety with use of the equipment.   Infection Prevention: - Provides verbal and written material to individual with discussion of infection control including proper hand washing and proper equipment cleaning during exercise session.   Falls Prevention: - Provides verbal and written material to individual with discussion of falls prevention and safety.   Diabetes: - Individual verbal and written instruction to review signs/symptoms of diabetes, desired ranges of glucose level fasting, after meals and with exercise. Advice that pre and post exercise glucose checks will be done for 3 sessions at entry of program.    Knowledge Questionnaire Score:     Knowledge  Questionnaire Score - 08/30/14 1052    Knowledge Questionnaire Score   Pre Score 23/28      Personal Goals and Risk Factors at Admission:     Personal Goals and Risk Factors at Admission - 07/31/14 1154    Personal Goals and Risk Factors on Admission    Weight Management Yes   Intervention Learn and follow the exercise and diet guidelines while in the program. Utilize the nutrition and education classes to help gain knowledge of the diet and exercise expectations in the program   Increase Aerobic Exercise and Physical Activity Yes   Intervention While in program, learn and follow the exercise prescription taught. Start at a low level workload and increase  workload after able to maintain previous level for 30 minutes. Increase time before increasing intensity.   Understand more about Heart/Pulmonary Disease. Yes   Intervention While in program utilize professionals for any questions, and attend the education sessions. Great websites to use are www.americanheart.org or www.lung.org for reliable information.   Diabetes No   Hypertension Yes   Goal Participant will see blood pressure controlled within the values of 140/52m/Hg or within value directed by their physician.   Intervention Provide nutrition & aerobic exercise along with prescribed medications to achieve BP 140/90 or less.   Lipids No   Stress No      Personal Goals and Risk Factors Review:      Goals and Risk Factor Review      08/01/14 0940 08/24/14 1111         Weight Management   Goals Progress/Improvement seen  Yes      Comments  MAintaining weight as desired. Has seen some decrease in inches.      Increase Aerobic Exercise and Physical Activity   Goals Progress/Improvement seen  Yes Yes      Comments [place]. Continues to improve with workloads.      Hypertension   Progress seen toward goals  Yes      Comments  BP remains controlled.          Personal Goals Discharge:     Comments: 30 day review. Continue with ITP.

## 2014-08-31 ENCOUNTER — Encounter: Payer: Medicare Other | Admitting: *Deleted

## 2014-08-31 ENCOUNTER — Other Ambulatory Visit: Payer: Self-pay | Admitting: Gastroenterology

## 2014-08-31 ENCOUNTER — Other Ambulatory Visit: Payer: Self-pay | Admitting: *Deleted

## 2014-08-31 VITALS — Ht 62.0 in | Wt 141.1 lb

## 2014-08-31 DIAGNOSIS — I214 Non-ST elevation (NSTEMI) myocardial infarction: Secondary | ICD-10-CM | POA: Diagnosis not present

## 2014-08-31 DIAGNOSIS — K743 Primary biliary cirrhosis: Secondary | ICD-10-CM | POA: Diagnosis not present

## 2014-08-31 DIAGNOSIS — K754 Autoimmune hepatitis: Secondary | ICD-10-CM | POA: Diagnosis not present

## 2014-08-31 DIAGNOSIS — K745 Biliary cirrhosis, unspecified: Secondary | ICD-10-CM | POA: Diagnosis not present

## 2014-08-31 NOTE — Progress Notes (Signed)
Daily Session Note  Patient Details  Name: Ruth Roth MRN: [id number] Date of Birth: 08/18/1928 Referring Provider:  Scott, Charlene, MD  Encounter Date: 08/31/2014  Check In:     Session Check In - 08/31/14 1634    Check-In   Staff Present Carroll Enterkin RN, BSN;Diane Wright RN, BSN;Renee MacMillan MS, ACSM CEP Exercise Physiologist   ER physicians immediately available to respond to emergencies See telemetry face sheet for immediately available ER MD   Medication changes reported     No   Fall or balance concerns reported    No   Warm-up and Cool-down Performed on first and last piece of equipment   VAD Patient? No   Pain Assessment   Currently in Pain? No/denies         Goals Met:  Proper associated with RPD/PD & O2 Sat Exercise tolerated well  Goals Unmet:  Not Applicable  Goals Comments:    Dr. Mark Miller is Medical Director for [facility] and [name]. 

## 2014-08-31 NOTE — Progress Notes (Signed)
Discharge Summary  Patient Details  Name: Ruth Roth MRN: [id number] Date of Birth: [birth year] Referring Provider:  Einar Pheasant, MD   Number of Visits:   Reason for Discharge:  Patient reached a stable level of exercise. Patient independent in their exercise.  Smoking History:  History  Smoking status  . Never Smoker   Smokeless tobacco  . Never Used    Diagnosis:  NSTEMI (non-ST elevated myocardial infarction)  ADL UCSD:   Initial Exercise Prescription:   Discharge Exercise Prescription (Final Exercise Prescription Changes):     Exercise Prescription Changes - 08/29/14 1600    Exercise Review   Progression Yes   Response to Exercise   Blood Pressure (Admit) 122/60 mmHg   Blood Pressure (Exercise) 162/70 mmHg   Blood Pressure (Exit) 138/60 mmHg   Heart Rate (Admit) 67 bpm   Heart Rate (Exercise) 78 bpm   Heart Rate (Exit) 59 bpm   Rating of Perceived Exertion (Exercise) 11   Symptoms No   Duration Progress to 50 minutes of aerobic without signs/symptoms of physical distress   Intensity Rest + 30   Progression Continue progressive overload as per policy without signs/symptoms or physical distress.   Resistance Training   Training Prescription Yes   Weight 2 lb   Reps 10-15   Interval Training   Interval Training Yes   Equipment NuStep   Comments L5-6   Treadmill   MPH 2.5   Grade 2   Minutes 15   NuStep   Level 6   Watts 60   Minutes 20   REL-XR   Level 3   Watts 45   Minutes 15      Functional Capacity:     6 Minute Walk      07/04/14 1152 08/31/14 1656     6 Minute Walk   Phase Initial Discharge    Distance 1180 feet 1312 feet    Walk Time 5.45 minutes 6 minutes    Resting HR 60 bpm 67 bpm    Resting BP 138/70 mmHg 124/60 mmHg    Max Ex. HR 74 bpm 115 bpm    Max Ex. BP 142/40 mmHg 142/64 mmHg    RPE 13 13    Symptoms Yes (comment) No    Comments no symproms    stopped for 15 secs        Psychological, QOL, Others -  Outcomes: PHQ 2/9: Depression screen [facility] 2/9 09/09/2013 09/07/2012 05/10/2012 05/01/2012  Decreased Interest 0 0 0 0  Down, Depressed, Hopeless 0 0 0 0  PHQ - 2 Score 0 0 0 0    Quality of Life:   Personal Goals: Goals established at orientation with interventions provided to work toward goal.     Personal Goals and Risk Factors at Admission - 07/31/14 1154    Personal Goals and Risk Factors on Admission    Weight Management Yes   Intervention Learn and follow the exercise and diet guidelines while in the program. Utilize the nutrition and education classes to help gain knowledge of the diet and exercise expectations in the program   Increase Aerobic Exercise and Physical Activity Yes   Intervention While in program, learn and follow the exercise prescription taught. Start at a low level workload and increase workload after able to maintain previous level for 30 minutes. Increase time before increasing intensity.   Understand more about Heart/Pulmonary Disease. Yes   Intervention While in program utilize professionals for any questions, and attend the education  sessions. Great websites to use are www.americanheart.org or www.lung.org for reliable information.   Diabetes No   Hypertension Yes   Goal Participant will see blood pressure controlled within the values of 140/8mm/Hg or within value directed by their physician.   Intervention Provide nutrition & aerobic exercise along with prescribed medications to achieve BP 140/90 or less.   Lipids No   Stress No       Personal Goals Discharge:   Nutrition & Weight - Outcomes:     Pre Biometrics - 07/04/14 1154    Pre Biometrics   Height 5\' 2"  (1.575 m)   Weight 142 lb 14.4 oz (64.819 kg)   Waist Circumference 31.5 inches   Hip Circumference 39.75 inches   Waist to Hip Ratio 0.79 %   BMI (Calculated) 26.2         Post Biometrics - 08/31/14 1656     Post  Biometrics   Height 5\' 2"  (1.575 m)   Weight 141 lb 1.6 oz (64.003 kg)    Waist Circumference 32 inches   Hip Circumference 38 inches   Waist to Hip Ratio 0.84 %   BMI (Calculated) 25.9      Nutrition:     Nutrition Therapy & Goals - 08/03/14 0920    Nutrition Therapy   Diet 1300 calorie meal plan, DASH diet principles   Fiber 20 grams   Whole Grain Foods 3 servings   Protein 6 ounces/day   Saturated Fats 9 max. grams   Fruits and Vegetables 5 servings/day   Personal Nutrition Goals   Personal Goal #1 Include at least 5 servings of fruits/vegetables/day   Personal Goal #2 Increase whole grains through brown rice, 100% whole wheat pasta, corn and ww sandwich thins      Nutrition Discharge:     Rate Your Plate - 579FGE 579FGE    Rate Your Plate Scores   Pre Score 58   Pre Score % 69 %      Education Questionnaire Score:     Knowledge Questionnaire Score - 08/30/14 1052    Knowledge Questionnaire Score   Pre Score 23/28      Goals reviewed with patient; copy given to patient.  Post program exercise plans are to join the [employer] program or group exercise classes

## 2014-09-02 ENCOUNTER — Encounter: Payer: Medicare Other | Admitting: *Deleted

## 2014-09-02 DIAGNOSIS — I214 Non-ST elevation (NSTEMI) myocardial infarction: Secondary | ICD-10-CM | POA: Diagnosis not present

## 2014-09-02 NOTE — Progress Notes (Signed)
Daily Session Note  Patient Details  Name: Ruth Roth MRN: [id number] Date of Birth: May 19, 1928 Referring Provider:  Einar Pheasant, MD  Encounter Date: 09/02/2014  Check In:      Goals Met:  Independence with exercise equipment Exercise tolerated well No report of cardiac concerns or symptoms Strength training completed today  Goals Unmet:  Not Applicable  Goals Comments:    Dr. Emily Filbert is Medical Director for [facility] and [name].

## 2014-09-05 ENCOUNTER — Encounter: Payer: Self-pay | Admitting: *Deleted

## 2014-09-05 ENCOUNTER — Encounter: Payer: Medicare Other | Admitting: *Deleted

## 2014-09-05 DIAGNOSIS — I214 Non-ST elevation (NSTEMI) myocardial infarction: Secondary | ICD-10-CM | POA: Diagnosis not present

## 2014-09-05 NOTE — Patient Instructions (Signed)
Discharge Instructions  Patient Details  Name: Ruth Roth MRN: [id number] Date of Birth: [birth year] Referring Provider:     Number of Visits: 36  Reason for Discharge:  Patient reached a stable level of exercise. Patient independent in their exercise.  Smoking History:  History  Smoking status  . Never Smoker   Smokeless tobacco  . Never Used    Diagnosis:  NSTEMI (non-ST elevated myocardial infarction)  Initial Exercise Prescription:   Discharge Exercise Prescription (Final Exercise Prescription Changes):     Exercise Prescription Changes - 08/29/14 1600    Exercise Review   Progression Yes   Response to Exercise   Blood Pressure (Admit) 122/60 mmHg   Blood Pressure (Exercise) 162/70 mmHg   Blood Pressure (Exit) 138/60 mmHg   Heart Rate (Admit) 67 bpm   Heart Rate (Exercise) 78 bpm   Heart Rate (Exit) 59 bpm   Rating of Perceived Exertion (Exercise) 11   Symptoms No   Duration Progress to 50 minutes of aerobic without signs/symptoms of physical distress   Intensity Rest + 30   Progression Continue progressive overload as per policy without signs/symptoms or physical distress.   Resistance Training   Training Prescription Yes   Weight 2 lb   Reps 10-15   Interval Training   Interval Training Yes   Equipment NuStep   Comments L5-6   Treadmill   MPH 2.5   Grade 2   Minutes 15   NuStep   Level 6   Watts 60   Minutes 20   REL-XR   Level 3   Watts 45   Minutes 15      Functional Capacity:     6 Minute Walk      07/04/14 1152 08/31/14 1656     6 Minute Walk   Phase Initial Discharge    Distance 1180 feet 1312 feet    Walk Time 5.45 minutes 6 minutes    Resting HR 60 bpm 67 bpm    Resting BP 138/70 mmHg 124/60 mmHg    Max Ex. HR 74 bpm 115 bpm    Max Ex. BP 142/40 mmHg 142/64 mmHg    RPE 13 13    Symptoms Yes (comment) No    Comments no symproms    stopped for 15 secs        Quality of Life:     Quality of Life - 09/05/14 1118    Quality of Life Scores   Health/Function Post 30 %   Socioeconomic Post 30 %   Psych/Spiritual Post 30 %   Family Post 30 %   GLOBAL Post 30 %      Personal Goals: Goals established at orientation with interventions provided to work toward goal.     Personal Goals and Risk Factors at Admission - 07/31/14 1154    Personal Goals and Risk Factors on Admission    Weight Management Yes   Intervention Learn and follow the exercise and diet guidelines while in the program. Utilize the nutrition and education classes to help gain knowledge of the diet and exercise expectations in the program   Increase Aerobic Exercise and Physical Activity Yes   Intervention While in program, learn and follow the exercise prescription taught. Start at a low level workload and increase workload after able to maintain previous level for 30 minutes. Increase time before increasing intensity.   Understand more about Heart/Pulmonary Disease. Yes   Intervention While in program utilize professionals for any questions, and attend the  education sessions. Great websites to use are www.americanheart.org or www.lung.org for reliable information.   Diabetes No   Hypertension Yes   Goal Participant will see blood pressure controlled within the values of 140/47mm/Hg or within value directed by their physician.   Intervention Provide nutrition & aerobic exercise along with prescribed medications to achieve BP 140/90 or less.   Lipids No   Stress No       Personal Goals Discharge:   Nutrition & Weight - Outcomes:     Pre Biometrics - 07/04/14 1154    Pre Biometrics   Height 5\' 2"  (1.575 m)   Weight 142 lb 14.4 oz (64.819 kg)   Waist Circumference 31.5 inches   Hip Circumference 39.75 inches   Waist to Hip Ratio 0.79 %   BMI (Calculated) 26.2         Post Biometrics - 08/31/14 1656     Post  Biometrics   Height 5\' 2"  (1.575 m)   Weight 141 lb 1.6 oz (64.003 kg)   Waist Circumference 32 inches   Hip  Circumference 38 inches   Waist to Hip Ratio 0.84 %   BMI (Calculated) 25.9      Nutrition:     Nutrition Therapy & Goals - 08/03/14 0920    Nutrition Therapy   Diet 1300 calorie meal plan, DASH diet principles   Fiber 20 grams   Whole Grain Foods 3 servings   Protein 6 ounces/day   Saturated Fats 9 max. grams   Fruits and Vegetables 5 servings/day   Personal Nutrition Goals   Personal Goal #1 Include at least 5 servings of fruits/vegetables/day   Personal Goal #2 Increase whole grains through brown rice, 100% whole wheat pasta, corn and ww sandwich thins      Nutrition Discharge:     Rate Your Plate - [id number] [id number]    Rate Your Plate Scores   Pre Score 58   Pre Score % 69 %   Post Score 70   Post Score % 84 %   % Change 15 %      Education Questionnaire Score:     Knowledge Questionnaire Score - 09/05/14 1117    Knowledge Questionnaire Score   Pre Score 23/28   Post Score 28/28      Goals reviewed with patient; copy given to patient.

## 2014-09-05 NOTE — Patient Instructions (Signed)
Discharge Instructions  Patient Details  Name: Ruth Roth MRN: [id number] Date of Birth: 1928/07/12 Referring Provider:     Number of Visits: 36  Reason for Discharge:  Patient reached a stable level of exercise. Patient independent in their exercise.  Smoking History:  History  Smoking status  . Never Smoker   Smokeless tobacco  . Never Used    Diagnosis:  NSTEMI (non-ST elevated myocardial infarction)  Initial Exercise Prescription:   Discharge Exercise Prescription (Final Exercise Prescription Changes):     Exercise Prescription Changes - 08/29/14 1600    Exercise Review   Progression Yes   Response to Exercise   Blood Pressure (Admit) 122/60 mmHg   Blood Pressure (Exercise) 162/70 mmHg   Blood Pressure (Exit) 138/60 mmHg   Heart Rate (Admit) 67 bpm   Heart Rate (Exercise) 78 bpm   Heart Rate (Exit) 59 bpm   Rating of Perceived Exertion (Exercise) 11   Symptoms No   Duration Progress to 50 minutes of aerobic without signs/symptoms of physical distress   Intensity Rest + 30   Progression Continue progressive overload as per policy without signs/symptoms or physical distress.   Resistance Training   Training Prescription Yes   Weight 2 lb   Reps 10-15   Interval Training   Interval Training Yes   Equipment NuStep   Comments L5-6   Treadmill   MPH 2.5   Grade 2   Minutes 15   NuStep   Level 6   Watts 60   Minutes 20   REL-XR   Level 3   Watts 45   Minutes 15      Functional Capacity:     6 Minute Walk      07/04/14 1152 08/31/14 1656     6 Minute Walk   Phase Initial Discharge    Distance 1180 feet 1312 feet    Walk Time 5.45 minutes 6 minutes    Resting HR 60 bpm 67 bpm    Resting BP 138/70 mmHg 124/60 mmHg    Max Ex. HR 74 bpm 115 bpm    Max Ex. BP 142/40 mmHg 142/64 mmHg    RPE 13 13    Symptoms Yes (comment) No    Comments no symproms    stopped for 15 secs        Quality of Life:     Quality of Life - 09/05/14 1118    Quality of Life Scores   Health/Function Post 30 %   Socioeconomic Post 30 %   Psych/Spiritual Post 30 %   Family Post 30 %   GLOBAL Post 30 %      Personal Goals: Goals established at orientation with interventions provided to work toward goal.     Personal Goals and Risk Factors at Admission - 07/31/14 1154    Personal Goals and Risk Factors on Admission    Weight Management Yes   Intervention Learn and follow the exercise and diet guidelines while in the program. Utilize the nutrition and education classes to help gain knowledge of the diet and exercise expectations in the program   Increase Aerobic Exercise and Physical Activity Yes   Intervention While in program, learn and follow the exercise prescription taught. Start at a low level workload and increase workload after able to maintain previous level for 30 minutes. Increase time before increasing intensity.   Understand more about Heart/Pulmonary Disease. Yes   Intervention While in program utilize professionals for any questions, and attend the  education sessions. Great websites to use are www.americanheart.org or www.lung.org for reliable information.   Diabetes No   Hypertension Yes   Goal Participant will see blood pressure controlled within the values of 140/20mm/Hg or within value directed by their physician.   Intervention Provide nutrition & aerobic exercise along with prescribed medications to achieve BP 140/90 or less.   Lipids No   Stress No       Personal Goals Discharge:   Nutrition & Weight - Outcomes:     Pre Biometrics - 07/04/14 1154    Pre Biometrics   Height 5\' 2"  (1.575 m)   Weight 142 lb 14.4 oz (64.819 kg)   Waist Circumference 31.5 inches   Hip Circumference 39.75 inches   Waist to Hip Ratio 0.79 %   BMI (Calculated) 26.2         Post Biometrics - 08/31/14 1656     Post  Biometrics   Height 5\' 2"  (1.575 m)   Weight 141 lb 1.6 oz (64.003 kg)   Waist Circumference 32 inches   Hip  Circumference 38 inches   Waist to Hip Ratio 0.84 %   BMI (Calculated) 25.9      Nutrition:     Nutrition Therapy & Goals - 08/03/14 0920    Nutrition Therapy   Diet 1300 calorie meal plan, DASH diet principles   Fiber 20 grams   Whole Grain Foods 3 servings   Protein 6 ounces/day   Saturated Fats 9 max. grams   Fruits and Vegetables 5 servings/day   Personal Nutrition Goals   Personal Goal #1 Include at least 5 servings of fruits/vegetables/day   Personal Goal #2 Increase whole grains through brown rice, 100% whole wheat pasta, corn and ww sandwich thins      Nutrition Discharge:     Rate Your Plate - [id number] [id number]    Rate Your Plate Scores   Pre Score 58   Pre Score % 69 %   Post Score 70   Post Score % 84 %   % Change 15 %      Education Questionnaire Score:     Knowledge Questionnaire Score - 09/05/14 1117    Knowledge Questionnaire Score   Pre Score 23/28   Post Score 28/28      Goals reviewed with patient; copy given to patient.

## 2014-09-05 NOTE — Progress Notes (Signed)
Daily Session Note  Patient Details  Name: Giannina S Schembri MRN: [id number] Date of Birth: 05/27/1928 Referring Provider:  Scott, Charlene, MD  Encounter Date: 09/05/2014  Check In:     Session Check In - 09/05/14 0847    Check-In   Staff Present Susanne Bice RN, BSN, CCRP;Kelly Hayes BS, ACSM CEP Exercise Physiologist;Renee MacMillan MS, ACSM CEP Exercise Physiologist   ER physicians immediately available to respond to emergencies See telemetry face sheet for immediately available ER MD   Medication changes reported     No   Fall or balance concerns reported    No   Warm-up and Cool-down Performed on first and last piece of equipment   VAD Patient? No   Pain Assessment   Currently in Pain? No/denies   Multiple Pain Sites No         Goals Met:  Independence with exercise equipment Exercise tolerated well No report of cardiac concerns or symptoms Strength training completed today  Goals Unmet:  Not Applicable  Goals Comments:    Dr. Mark Miller is Medical Director for [facility] and [name]. 

## 2014-09-06 ENCOUNTER — Ambulatory Visit: Payer: Medicare Other

## 2014-09-07 DIAGNOSIS — I214 Non-ST elevation (NSTEMI) myocardial infarction: Secondary | ICD-10-CM

## 2014-09-07 NOTE — Progress Notes (Signed)
Daily Session Note  Patient Details  Name: Ruth Roth MRN: [id number] Date of Birth: Mar 09, 1929 Referring Provider:  Einar Pheasant, MD  Encounter Date: 09/07/2014  Check In:     Session Check In - 09/07/14 0828    Check-In   Staff Present Heath Lark RN, BSN, CCRP;Mercia Dowe BS, ACSM EP-C, Exercise Physiologist;Renee Dillard [place] MS, ACSM CEP Exercise Physiologist   ER physicians immediately available to respond to emergencies See telemetry face sheet for immediately available ER MD   Medication changes reported     No   Fall or balance concerns reported    No   Warm-up and Cool-down Performed on first and last piece of equipment   VAD Patient? No   Pain Assessment   Currently in Pain? No/denies         Goals Met:  Proper associated with RPD/PD & O2 Sat Exercise tolerated well No report of cardiac concerns or symptoms Strength training completed today  Goals Unmet:  Not Applicable  Goals Comments:    Dr. Emily Filbert is Medical Director for [name].

## 2014-09-09 ENCOUNTER — Encounter: Payer: Self-pay | Admitting: *Deleted

## 2014-09-09 NOTE — Progress Notes (Signed)
Discharge Summary  Patient Details  Name: Ruth Roth MRN: [id number] Date of Birth: [birth year] Referring Provider:  No ref. provider found   Number of Visits: 36  Reason for Discharge:  Patient reached a stable level of exercise. Patient independent in their exercise.  Smoking History:  History  Smoking status  . Never Smoker   Smokeless tobacco  . Never Used    Diagnosis:  No diagnosis found.  ADL UCSD:   Initial Exercise Prescription:   Discharge Exercise Prescription (Final Exercise Prescription Changes):     Exercise Prescription Changes - 08/29/14 1600    Exercise Review   Progression Yes   Response to Exercise   Blood Pressure (Admit) 122/60 mmHg   Blood Pressure (Exercise) 162/70 mmHg   Blood Pressure (Exit) 138/60 mmHg   Heart Rate (Admit) 67 bpm   Heart Rate (Exercise) 78 bpm   Heart Rate (Exit) 59 bpm   Rating of Perceived Exertion (Exercise) 11   Symptoms No   Duration Progress to 50 minutes of aerobic without signs/symptoms of physical distress   Intensity Rest + 30   Progression Continue progressive overload as per policy without signs/symptoms or physical distress.   Resistance Training   Training Prescription Yes   Weight 2 lb   Reps 10-15   Interval Training   Interval Training Yes   Equipment NuStep   Comments L5-6   Treadmill   MPH 2.5   Grade 2   Minutes 15   NuStep   Level 6   Watts 60   Minutes 20   REL-XR   Level 3   Watts 45   Minutes 15      Functional Capacity:     6 Minute Walk      07/04/14 1152 08/31/14 1656     6 Minute Walk   Phase Initial Discharge    Distance 1180 feet 1312 feet    Walk Time 5.45 minutes 6 minutes    Resting HR 60 bpm 67 bpm    Resting BP 138/70 mmHg 124/60 mmHg    Max Ex. HR 74 bpm 115 bpm    Max Ex. BP 142/40 mmHg 142/64 mmHg    RPE 13 13    Symptoms Yes (comment) No    Comments no symproms    stopped for 15 secs        Psychological, QOL, Others - Outcomes: PHQ  2/9: Depression screen [facility] 2/9 09/05/2014 09/09/2013 09/07/2012 05/10/2012 05/01/2012  Decreased Interest 0 0 0 0 0  Down, Depressed, Hopeless 0 0 0 0 0  PHQ - 2 Score 0 0 0 0 0  Altered sleeping 0 - - - -  Tired, decreased energy 0 - - - -  Change in appetite 0 - - - -  Feeling bad or failure about yourself  0 - - - -  Trouble concentrating 0 - - - -  Moving slowly or fidgety/restless 0 - - - -  Suicidal thoughts 0 - - - -  PHQ-9 Score 0 - - - -  Difficult doing work/chores Not difficult at all - - - -    Quality of Life:     Quality of Life - 09/05/14 1118    Quality of Life Scores   Health/Function Post 30 %   Socioeconomic Post 30 %   Psych/Spiritual Post 30 %   Family Post 30 %   GLOBAL Post 30 %      Personal Goals: Goals established at orientation with  interventions provided to work toward goal.     Personal Goals and Risk Factors at Admission - 07/31/14 1154    Personal Goals and Risk Factors on Admission    Weight Management Yes   Intervention Learn and follow the exercise and diet guidelines while in the program. Utilize the nutrition and education classes to help gain knowledge of the diet and exercise expectations in the program   Increase Aerobic Exercise and Physical Activity Yes   Intervention While in program, learn and follow the exercise prescription taught. Start at a low level workload and increase workload after able to maintain previous level for 30 minutes. Increase time before increasing intensity.   Understand more about Heart/Pulmonary Disease. Yes   Intervention While in program utilize professionals for any questions, and attend the education sessions. Great websites to use are www.americanheart.org or www.lung.org for reliable information.   Diabetes No   Hypertension Yes   Goal Participant will see blood pressure controlled within the values of 140/12mm/Hg or within value directed by their physician.   Intervention Provide nutrition & aerobic  exercise along with prescribed medications to achieve BP 140/90 or less.   Lipids No   Stress No       Personal Goals Discharge:   Nutrition & Weight - Outcomes:     Pre Biometrics - 07/04/14 1154    Pre Biometrics   Height 5\' 2"  (1.575 m)   Weight 142 lb 14.4 oz (64.819 kg)   Waist Circumference 31.5 inches   Hip Circumference 39.75 inches   Waist to Hip Ratio 0.79 %   BMI (Calculated) 26.2         Post Biometrics - 08/31/14 1656     Post  Biometrics   Height 5\' 2"  (1.575 m)   Weight 141 lb 1.6 oz (64.003 kg)   Waist Circumference 32 inches   Hip Circumference 38 inches   Waist to Hip Ratio 0.84 %   BMI (Calculated) 25.9      Nutrition:     Nutrition Therapy & Goals - 08/03/14 0920    Nutrition Therapy   Diet 1300 calorie meal plan, DASH diet principles   Fiber 20 grams   Whole Grain Foods 3 servings   Protein 6 ounces/day   Saturated Fats 9 max. grams   Fruits and Vegetables 5 servings/day   Personal Nutrition Goals   Personal Goal #1 Include at least 5 servings of fruits/vegetables/day   Personal Goal #2 Increase whole grains through brown rice, 100% whole wheat pasta, corn and ww sandwich thins      Nutrition Discharge:     Rate Your Plate - [id number] [id number]    Rate Your Plate Scores   Pre Score 58   Pre Score % 69 %   Post Score 70   Post Score % 84 %   % Change 15 %      Education Questionnaire Score:     Knowledge Questionnaire Score - 09/05/14 1117    Knowledge Questionnaire Score   Pre Score 23/28   Post Score 28/28      Goals reviewed with patient; copy given to patient.

## 2014-09-13 ENCOUNTER — Encounter: Payer: Self-pay | Admitting: *Deleted

## 2014-09-13 DIAGNOSIS — Z9889 Other specified postprocedural states: Secondary | ICD-10-CM

## 2014-09-13 DIAGNOSIS — I214 Non-ST elevation (NSTEMI) myocardial infarction: Secondary | ICD-10-CM

## 2014-09-13 NOTE — Progress Notes (Signed)
Cardiac Individual Treatment Plan  Patient Details  Name: Ruth Roth MRN: [id number] Date of Birth: 13-Aug-1928 Referring Provider:  Dr.M. Arida Initial Encounter Date:  06/27/2014  Visit Diagnosis: AMI Patient's Home Medications on Admission:  Current outpatient prescriptions:  .  amLODipine (NORVASC) 10 MG tablet, TAKE 1 TABLET BY MOUTH EVERY DAY, Disp: 90 tablet, Rfl: 1 .  aspirin 81 MG tablet, Take 81 mg by mouth daily.  , Disp: , Rfl:  .  furosemide (LASIX) 20 MG tablet, Take 1 tablet (20 mg total) by mouth daily., Disp: 30 tablet, Rfl: 5 .  gabapentin (NEURONTIN) 300 MG capsule, TAKE 2 CAPSULES BY MOUTH FOUR TIMES DAILY., Disp: 720 capsule, Rfl: 1 .  ipratropium (ATROVENT) 0.06 % nasal spray, Place 1 spray into both nostrils as needed. , Disp: , Rfl:  .  levothyroxine (SYNTHROID, LEVOTHROID) 112 MCG tablet, TAKE 1 TABLET BY MOUTH EVERY DAY BEFORE BREAKFAST, Disp: 90 tablet, Rfl: 3 .  metoprolol (LOPRESSOR) 50 MG tablet, Take 1 tablet (50 mg total) by mouth 2 (two) times daily., Disp: 180 tablet, Rfl: 1 .  omeprazole (PRILOSEC) 20 MG capsule, Take 1 capsule (20 mg total) by mouth 2 (two) times daily., Disp: 180 capsule, Rfl: 3 .  potassium chloride SA (K-DUR,KLOR-CON) 20 MEQ tablet, Take one tablet every other day., Disp: 30 tablet, Rfl: 1 .  Probiotic Product (ALIGN) 4 MG CAPS, Take 1 capsule by mouth daily., Disp: , Rfl:  .  traMADol (ULTRAM) 50 MG tablet, One daily prn, Disp: 60 tablet, Rfl: 1 .  ursodiol (ACTIGALL) 300 MG capsule, Take 300 mg by mouth. 2 morning - 1 night , Disp: , Rfl:   Past Medical History: Past Medical History  Diagnosis Date  . Hypertension   . 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 Fibrocystic breast disease   . Osteoarthritis   . Inflammatory arthritis   . Autoimmune hepatitis     followed by Dr [name]  . Neuropathy   . History of colon polyps 2000  . MI (myocardial infarction)   .  Thyroid disease   . CHF (congestive heart failure)   . Coronary artery disease     Non-ST elevation myocardial infarction in March 2016. Cardiac catheterization showed 60% ostial left circumflex hazy stenosis which was possibly the culprit. Mild LAD/RCA disease. EF 60% by echo.  . Hypercholesterolemia     Tobacco Use: History  Smoking status  . Never Smoker   Smokeless tobacco  . Never Used    Labs: Recent Review Flowsheet Data    Labs for ITP Cardiac and Pulmonary Rehab Latest Ref Rng 09/02/2012 12/17/2012 09/16/2013 10/12/2013 04/12/2014   Cholestrol 0 - 200 mg/dL 227(H) 221(H) 189 208(H) 201(H)   LDLCALC 0 - 99 mg/dL - - 107(H) 130(H) 113(H)   LDLDIRECT - 152.8 143.5 - - -   HDL >39.00 mg/dL 60.60 53.20 60.00 63.70 60.90   Trlycerides 0.0 - 149.0 mg/dL 128.0 169.0(H) 108.0 73.0 138.0       Exercise Target Goals:    Exercise Program Goal: Individual exercise prescription set with THRR, safety & activity barriers. Participant demonstrates ability to understand and report RPE using BORG scale, to self-measure pulse accurately, and to acknowledge the importance of the exercise prescription.  Exercise Prescription Goal: Starting with aerobic activity 30 plus minutes a day, 3 days per week for initial exercise prescription. Provide home exercise prescription and guidelines that participant acknowledges understanding prior to  discharge.  Activity Barriers & Risk Stratification:     Activity Barriers & Risk Stratification - 07/31/14 1151    Activity Barriers & Risk Stratification   Activity Barriers Joint Problems   Risk Stratification High      6 Minute Walk:     6 Minute Walk      07/04/14 1152 08/31/14 1656     6 Minute Walk   Phase Initial Discharge    Distance 1180 feet 1312 feet    Walk Time 5.45 minutes 6 minutes    Resting HR 60 bpm 67 bpm    Resting BP 138/70 mmHg 124/60 mmHg    Max Ex. HR 74 bpm 115 bpm    Max Ex. BP 142/40 mmHg 142/64 mmHg    RPE 13 13     Symptoms Yes (comment) No    Comments no symproms    stopped for 15 secs        Initial Exercise Prescription:   Exercise Prescription Changes:     Exercise Prescription Changes      07/27/14 0800 08/02/14 0800 08/10/14 0900 08/24/14 0800 08/29/14 1600   Exercise Review   Progression Yes Yes Yes Yes Yes   Response to Exercise   Blood Pressure (Admit)  136/80 mmHg   122/60 mmHg   Blood Pressure (Exercise)  136/70 mmHg   162/70 mmHg   Blood Pressure (Exit)  142/60 mmHg   138/60 mmHg   Heart Rate (Admit)  72 bpm   67 bpm   Heart Rate (Exercise)  89 bpm   78 bpm   Heart Rate (Exit)  70 bpm   59 bpm   Rating of Perceived Exertion (Exercise)  12   11   Symptoms     No   Frequency   Add 1 additional day to program exercise sessions. Add 1 additional day to program exercise sessions.    Duration  Progress to 30 minutes of continuous aerobic without signs/symptoms of physical distress   Progress to 50 minutes of aerobic without signs/symptoms of physical distress   Intensity  THRR unchanged   Rest + 30   Progression  Continue progressive overload as per policy without signs/symptoms or physical distress.   Continue progressive overload as per policy without signs/symptoms or physical distress.   Resistance Training   Training Prescription Yes Yes   Yes   Weight 2 2   2  lb   Reps 10-12 10-12   10-15   Interval Training   Interval Training    Yes Yes   Equipment    NuStep NuStep   Comments    L5-6 L5-6   Treadmill   MPH  2.2 2.8 2.8 2.5   Grade  0 0 0 2   Minutes  15 15 15 15    NuStep   Level 3 4   6    Watts 45 45   60   Minutes  20   20   REL-XR   Level    3 3   Watts    45 45   Minutes     15      Discharge Exercise Prescription (Final Exercise Prescription Changes):     Exercise Prescription Changes - 08/29/14 1600    Exercise Review   Progression Yes   Response to Exercise   Blood Pressure (Admit) 122/60 mmHg   Blood Pressure (Exercise) 162/70 mmHg   Blood  Pressure (Exit) 138/60 mmHg   Heart Rate (Admit) 67 bpm   Heart  Rate (Exercise) 78 bpm   Heart Rate (Exit) 59 bpm   Rating of Perceived Exertion (Exercise) 11   Symptoms No   Duration Progress to 50 minutes of aerobic without signs/symptoms of physical distress   Intensity Rest + 30   Progression Continue progressive overload as per policy without signs/symptoms or physical distress.   Resistance Training   Training Prescription Yes   Weight 2 lb   Reps 10-15   Interval Training   Interval Training Yes   Equipment NuStep   Comments L5-6   Treadmill   MPH 2.5   Grade 2   Minutes 15   NuStep   Level 6   Watts 60   Minutes 20   REL-XR   Level 3   Watts 45   Minutes 15      Nutrition:  Target Goals: Understanding of nutrition guidelines, daily intake of sodium 1500mg , cholesterol 200mg , calories 30% from fat and 7% or less from saturated fats, daily to have 5 or more servings of fruits and vegetables.  Biometrics:     Pre Biometrics - 07/04/14 1154    Pre Biometrics   Height 5\' 2"  (1.575 m)   Weight 142 lb 14.4 oz (64.819 kg)   Waist Circumference 31.5 inches   Hip Circumference 39.75 inches   Waist to Hip Ratio 0.79 %   BMI (Calculated) 26.2         Post Biometrics - 08/31/14 1656     Post  Biometrics   Height 5\' 2"  (1.575 m)   Weight 141 lb 1.6 oz (64.003 kg)   Waist Circumference 32 inches   Hip Circumference 38 inches   Waist to Hip Ratio 0.84 %   BMI (Calculated) 25.9      Nutrition Therapy Plan and Nutrition Goals:     Nutrition Therapy & Goals - 08/03/14 0920    Nutrition Therapy   Diet 1300 calorie meal plan, DASH diet principles   Fiber 20 grams   Whole Grain Foods 3 servings   Protein 6 ounces/day   Saturated Fats 9 max. grams   Fruits and Vegetables 5 servings/day   Personal Nutrition Goals   Personal Goal #1 Include at least 5 servings of fruits/vegetables/day   Personal Goal #2 Increase whole grains through brown rice, 100% whole  wheat pasta, corn and ww sandwich thins      Nutrition Discharge: Rate Your Plate Scores:     Rate Your Plate - [id number]    Rate Your Plate Scores   Pre Score 58   Pre Score % 69 %   Post Score 70   Post Score % 84 %   % Change 15 %      Nutrition Goals Re-Evaluation:     Nutrition Goals Re-Evaluation      08/01/14 0939 08/24/14 1111 08/30/14 1053       Personal Goal #1 Re-Evaluation   Personal Goal #1 Eats healthier since she started Cardiac Rehab she reports. Include at least 5 servings of fruits/vegetables/day Include at least 5 servings of fruits/vegetables/day     Goal Progress Seen  Yes      Comments  Continues to follow guidelines Continues to follow guidelines     Personal Goal #2 Re-Evaluation   Personal Goal #2  Increase whole grains through brown rice, 100% whole wheat pasta, corn and ww sandwich thins Increase whole grains through brown rice, 100% whole wheat pasta, corn and ww sandwich thins     Goal Progress Seen  Yes Yes     Comments  Continues to follow guidelines Continues to follow guidelines        Psychosocial: Target Goals: Acknowledge presence or absence of depression, maximize coping skills, provide positive support system. Participant is able to verbalize types and ability to use techniques and skills needed for reducing stress and depression.  Initial Review & Psychosocial Screening:     Initial Psych Review & Screening - 07/20/14 Green? No   Concerns No support system  Ms. Skeete has a very limited support system with a daughter who lives 30 miles away who seldom visits and a son in [place] area.  Her spouse passed 9 years ago.   Barriers   Psychosocial barriers to participate in program There are no identifiable barriers or psychosocial needs.;The patient should benefit from training in stress management and relaxation.   Screening Interventions   Interventions Encouraged to exercise      Quality of  Life Scores:     Quality of Life - 09/05/14 1118    Quality of Life Scores   Health/Function Post 30 %   Socioeconomic Post 30 %   Psych/Spiritual Post 30 %   Family Post 30 %   GLOBAL Post 30 %      PHQ-9:     Recent Review Flowsheet Data    Depression screen [facility] 2/9 09/05/2014 09/09/2013 09/07/2012 05/10/2012 05/01/2012   Decreased Interest 0 0 0 0 0   Down, Depressed, Hopeless 0 0 0 0 0   PHQ - 2 Score 0 0 0 0 0   Altered sleeping 0 - - - -   Tired, decreased energy 0 - - - -   Change in appetite 0 - - - -   Feeling bad or failure about yourself  0 - - - -   Trouble concentrating 0 - - - -   Moving slowly or fidgety/restless 0 - - - -   Suicidal thoughts 0 - - - -   PHQ-9 Score 0 - - - -   Difficult doing work/chores Not difficult at all - - - -      Psychosocial Evaluation and Intervention:     Psychosocial Evaluation - 09/07/14 0943    Discharge Psychosocial Assessment & Intervention   Comments Ms. Holsten reports today is her last day in this program and she has only positive things to say about it.  She states her strength and stamina have improved and she has enjoyed the social aspect of working out with others.  As a result, she is on a wait list with another patient to attend the "[facility]" program offered here as a follow up to maintain progress and provide consistency.  Ms. Berghorst states she has enjoyed the positive staff here in this program and has benefitted from the educational components as well.  She will be missed!      Psychosocial Re-Evaluation:   Vocational Rehabilitation: Provide vocational rehab assistance to qualifying candidates.   Vocational Rehab Evaluation & Intervention:     Vocational Rehab - 07/31/14 1152    Initial Vocational Rehab Evaluation & Intervention   Assessment shows need for Vocational Rehabilitation No      Education: Education Goals: Education classes will be provided on a weekly basis, covering required topics.  Participant will state understanding/return demonstration of topics presented.  Learning Barriers/Preferences:     Learning Barriers/Preferences - 07/31/14 1151    Learning Barriers/Preferences  Learning Barriers None   Learning Preferences Video      Education Topics: General Nutrition Guidelines/Fats and Fiber: -Group instruction provided by verbal, written material, models and posters to present the general guidelines for heart healthy nutrition. Gives an explanation and review of dietary fats and fiber.   Controlling Sodium/Reading Food Labels: -Group verbal and written material supporting the discussion of sodium use in heart healthy nutrition. Review and explanation with models, verbal and written materials for utilization of the food label.   Exercise Physiology & Risk Factors: - Group verbal and written instruction with models to review the exercise physiology of the cardiovascular system and associated critical values. Details cardiovascular disease risk factors and the goals associated with each risk factor.   Aerobic Exercise & Resistance Training: - Gives group verbal and written discussion on the health impact of inactivity. On the components of aerobic and resistive training programs and the benefits of this training and how to safely progress through these programs.   Flexibility, Balance, General Exercise Guidelines: - Provides group verbal and written instruction on the benefits of flexibility and balance training programs. Provides general exercise guidelines with specific guidelines to those with heart or lung disease. Demonstration and skill practice provided.   Stress Management: - Provides group verbal and written instruction about the health risks of elevated stress, cause of high stress, and healthy ways to reduce stress.   Depression: - Provides group verbal and written instruction on the correlation between heart/lung disease and depressed mood,  treatment options, and the stigmas associated with seeking treatment.   Anatomy & Physiology of the Heart: - Group verbal and written instruction and models provide basic cardiac anatomy and physiology, with the coronary electrical and arterial systems. Review of: AMI, Angina, Valve disease, Heart Failure, Cardiac Arrhythmia, Pacemakers, and the ICD.   Cardiac Procedures: - Group verbal and written instruction and models to describe the testing methods done to diagnose heart disease. Reviews the outcomes of the test results. Describes the treatment choices: Medical Management, Angioplasty, or Coronary Bypass Surgery.   Cardiac Medications: - Group verbal and written instruction to review commonly prescribed medications for heart disease. Reviews the medication, class of the drug, and side effects. Includes the steps to properly store meds and maintain the prescription regimen.   Go Sex-Intimacy & Heart Disease, Get SMART - Goal Setting: - Group verbal and written instruction through game format to discuss heart disease and the return to sexual intimacy. Provides group verbal and written material to discuss and apply goal setting through the application of the S.M.A.R.T. Method.   Other Matters of the Heart: - Provides group verbal, written materials and models to describe Heart Failure, Angina, Valve Disease, and Diabetes in the realm of heart disease. Includes description of the disease process and treatment options available to the cardiac patient.   Exercise & Equipment Safety: - Individual verbal instruction and demonstration of equipment use and safety with use of the equipment.   Infection Prevention: - Provides verbal and written material to individual with discussion of infection control including proper hand washing and proper equipment cleaning during exercise session.   Falls Prevention: - Provides verbal and written material to individual with discussion of falls  prevention and safety.   Diabetes: - Individual verbal and written instruction to review signs/symptoms of diabetes, desired ranges of glucose level fasting, after meals and with exercise. Advice that pre and post exercise glucose checks will be done for 3 sessions at entry of program.  Knowledge Questionnaire Score:     Knowledge Questionnaire Score - 09/05/14 1117    Knowledge Questionnaire Score   Pre Score 23/28   Post Score 28/28      Personal Goals and Risk Factors at Admission:     Personal Goals and Risk Factors at Admission - 07/31/14 1154    Personal Goals and Risk Factors on Admission    Weight Management Yes   Intervention Learn and follow the exercise and diet guidelines while in the program. Utilize the nutrition and education classes to help gain knowledge of the diet and exercise expectations in the program   Increase Aerobic Exercise and Physical Activity Yes   Intervention While in program, learn and follow the exercise prescription taught. Start at a low level workload and increase workload after able to maintain previous level for 30 minutes. Increase time before increasing intensity.   Understand more about Heart/Pulmonary Disease. Yes   Intervention While in program utilize professionals for any questions, and attend the education sessions. Great websites to use are www.americanheart.org or www.lung.org for reliable information.   Diabetes No   Hypertension Yes   Goal Participant will see blood pressure controlled within the values of 140/26mm/Hg or within value directed by their physician.   Intervention Provide nutrition & aerobic exercise along with prescribed medications to achieve BP 140/90 or less.   Lipids No   Stress No      Personal Goals and Risk Factors Review:      Goals and Risk Factor Review      08/01/14 0940 08/24/14 1111         Weight Management   Goals Progress/Improvement seen  Yes      Comments  MAintaining weight as  desired. Has seen some decrease in inches.      Increase Aerobic Exercise and Physical Activity   Goals Progress/Improvement seen  Yes Yes      Comments Is going to exercise in [place] at [place] when she is done with Cardiac Rehab. Continues to improve with workloads.      Hypertension   Progress seen toward goals  Yes      Comments  BP remains controlled.         Personal Goals Discharge:     Comments: Discharge. Will exercise with [facility].

## 2014-09-14 ENCOUNTER — Ambulatory Visit
Admission: RE | Admit: 2014-09-14 | Discharge: 2014-09-14 | Disposition: A | Discharge status: Home / Self Care | Payer: Medicare Other | Source: Ambulatory Visit | Attending: Gastroenterology | Admitting: Gastroenterology

## 2014-09-14 DIAGNOSIS — K745 Biliary cirrhosis, unspecified: Secondary | ICD-10-CM | POA: Diagnosis not present

## 2014-09-14 DIAGNOSIS — K743 Primary biliary cirrhosis: Secondary | ICD-10-CM | POA: Diagnosis not present

## 2014-09-14 DIAGNOSIS — N281 Cyst of kidney, acquired: Secondary | ICD-10-CM | POA: Diagnosis not present

## 2014-09-14 DIAGNOSIS — K838 Other specified diseases of biliary tract: Secondary | ICD-10-CM | POA: Diagnosis not present

## 2014-09-14 DIAGNOSIS — Z9049 Acquired absence of other specified parts of digestive tract: Secondary | ICD-10-CM | POA: Diagnosis not present

## 2014-09-14 DIAGNOSIS — K754 Autoimmune hepatitis: Secondary | ICD-10-CM | POA: Insufficient documentation

## 2014-09-20 DIAGNOSIS — D329 Benign neoplasm of meninges, unspecified: Secondary | ICD-10-CM | POA: Insufficient documentation

## 2014-09-22 DIAGNOSIS — H40052 Ocular hypertension, left eye: Secondary | ICD-10-CM | POA: Diagnosis not present

## 2014-09-28 ENCOUNTER — Other Ambulatory Visit (INDEPENDENT_AMBULATORY_CARE_PROVIDER_SITE_OTHER): Payer: Medicare Other

## 2014-09-28 DIAGNOSIS — E7800 Pure hypercholesterolemia, unspecified: Secondary | ICD-10-CM

## 2014-09-28 DIAGNOSIS — D649 Anemia, unspecified: Secondary | ICD-10-CM

## 2014-09-28 DIAGNOSIS — R197 Diarrhea, unspecified: Secondary | ICD-10-CM

## 2014-09-28 DIAGNOSIS — K219 Gastro-esophageal reflux disease without esophagitis: Secondary | ICD-10-CM

## 2014-09-28 DIAGNOSIS — N289 Disorder of kidney and ureter, unspecified: Secondary | ICD-10-CM

## 2014-09-28 LAB — LIPID PANEL
Cholesterol: 196 mg/dL (ref 0–200)
HDL: 59.2 mg/dL (ref >39.00)
LDL Cholesterol: 109 mg/dL — ABNORMAL HIGH (ref 0–99)
NONHDL: 136.8
TRIGLYCERIDES: 139 mg/dL (ref 0.0–149.0)
Total CHOL/HDL Ratio: 3
VLDL: 27.8 mg/dL (ref 0.0–40.0)

## 2014-09-28 LAB — BASIC METABOLIC PANEL
BUN: 29 mg/dL — AB (ref 6–23)
CO2: 29 meq/L (ref 19–32)
Calcium: 9.4 mg/dL (ref 8.4–10.5)
Chloride: 104 mEq/L (ref 96–112)
Creatinine, Ser: 0.95 mg/dL (ref 0.40–1.20)
GFR: 59.52 mL/min — AB (ref >60.00)
Glucose, Bld: 88 mg/dL (ref 70–99)
POTASSIUM: 4.7 meq/L (ref 3.5–5.1)
Sodium: 139 mEq/L (ref 135–145)

## 2014-09-28 LAB — CBC WITH DIFFERENTIAL/PLATELET
Basophils Absolute: 0 10*3/uL (ref 0.0–0.1)
Basophils Relative: 0.4 % (ref 0.0–3.0)
EOS ABS: 0.3 10*3/uL (ref 0.0–0.7)
EOS PCT: 4.5 % (ref 0.0–5.0)
HEMATOCRIT: 37.5 % (ref 36.0–46.0)
Hemoglobin: 12.6 g/dL (ref 12.0–15.0)
LYMPHS ABS: 2.2 10*3/uL (ref 0.7–4.0)
Lymphocytes Relative: 32.4 % (ref 12.0–46.0)
MCHC: 33.5 g/dL (ref 30.0–36.0)
MCV: 92.3 fl (ref 78.0–100.0)
MONO ABS: 0.6 10*3/uL (ref 0.1–1.0)
Monocytes Relative: 9 % (ref 3.0–12.0)
NEUTROS ABS: 3.6 10*3/uL (ref 1.4–7.7)
NEUTROS PCT: 53.7 % (ref 43.0–77.0)
Platelets: 222 10*3/uL (ref 150.0–400.0)
RBC: 4.06 Mil/uL (ref 3.87–5.11)
RDW: 14.8 % (ref 11.5–15.5)
WBC: 6.8 10*3/uL (ref 4.0–10.5)

## 2014-09-28 LAB — HEPATIC FUNCTION PANEL
ALT: 16 U/L (ref 0–35)
AST: 12 U/L (ref 0–37)
Albumin: 4.3 g/dL (ref 3.5–5.2)
Alkaline Phosphatase: 65 U/L (ref 39–117)
BILIRUBIN DIRECT: 0.1 mg/dL (ref 0.0–0.3)
Total Bilirubin: 0.4 mg/dL (ref 0.2–1.2)
Total Protein: 6.9 g/dL (ref 6.0–8.3)

## 2014-09-28 LAB — FERRITIN: Ferritin: 22.1 ng/mL (ref 10.0–291.0)

## 2014-09-30 ENCOUNTER — Ambulatory Visit (INDEPENDENT_AMBULATORY_CARE_PROVIDER_SITE_OTHER): Payer: Medicare Other | Admitting: Internal Medicine

## 2014-09-30 ENCOUNTER — Encounter: Payer: Self-pay | Admitting: Internal Medicine

## 2014-09-30 VITALS — BP 126/68 | HR 55 | Temp 98.5°F | Ht 61.0 in | Wt 141.5 lb

## 2014-09-30 DIAGNOSIS — N289 Disorder of kidney and ureter, unspecified: Secondary | ICD-10-CM

## 2014-09-30 DIAGNOSIS — E7800 Pure hypercholesterolemia, unspecified: Secondary | ICD-10-CM

## 2014-09-30 DIAGNOSIS — R938 Abnormal findings on diagnostic imaging of other specified body structures: Secondary | ICD-10-CM

## 2014-09-30 DIAGNOSIS — E039 Hypothyroidism, unspecified: Secondary | ICD-10-CM

## 2014-09-30 DIAGNOSIS — D320 Benign neoplasm of cerebral meninges: Secondary | ICD-10-CM | POA: Diagnosis not present

## 2014-09-30 DIAGNOSIS — Z0000 Encounter for general adult medical examination without abnormal findings: Secondary | ICD-10-CM

## 2014-09-30 DIAGNOSIS — I2510 Atherosclerotic heart disease of native coronary artery without angina pectoris: Secondary | ICD-10-CM

## 2014-09-30 DIAGNOSIS — R9389 Abnormal findings on diagnostic imaging of other specified body structures: Secondary | ICD-10-CM

## 2014-09-30 DIAGNOSIS — K219 Gastro-esophageal reflux disease without esophagitis: Secondary | ICD-10-CM | POA: Diagnosis not present

## 2014-09-30 DIAGNOSIS — R739 Hyperglycemia, unspecified: Secondary | ICD-10-CM

## 2014-09-30 DIAGNOSIS — K754 Autoimmune hepatitis: Secondary | ICD-10-CM

## 2014-09-30 DIAGNOSIS — I10 Essential (primary) hypertension: Secondary | ICD-10-CM

## 2014-09-30 MED ORDER — TRAMADOL HCL 50 MG PO TABS: ORAL_TABLET | ORAL | Status: DC

## 2014-09-30 NOTE — Progress Notes (Signed)
[name], female   DOB: 11/24/1928, 81 y.o.   MRN: [id number]   Subjective:    [name], female    DOB: Nov 22, 1928, 81 y.o.   MRN: [id number]  HPI  Patient here to follow up on her medical issues as well as for a complete physical exam.  She reported that she had a reaction to lasix.  States was stopped.  After reviewing the chart, it appears it was plavix she had a reaction to.  Need to clarify if still taking lasix.  If so, will need to continue her potassium.  She has been to cardiac rehab.  Did well with this.  Feels good.  No cardiac symptoms with increased activity or exertion.  No sob.  No acid reflux.  No bowel change.  Takes tramadol prn.  Does not take daily.  Is due to f/u with Dr [name] 10/05/14.     Past Medical History  Diagnosis Date  . Hypertension   . 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 Fibrocystic breast disease   . Osteoarthritis   . Inflammatory arthritis   . Autoimmune hepatitis     followed by Dr [name]  . Neuropathy   . History of colon polyps 2000  . MI (myocardial infarction)   . Thyroid disease   . CHF (congestive heart failure)   . Coronary artery disease     Non-ST elevation myocardial infarction in March 2016. Cardiac catheterization showed 60% ostial left circumflex hazy stenosis which was possibly the culprit. Mild LAD/RCA disease. EF 60% by echo.  . Hypercholesterolemia     Outpatient Encounter Prescriptions as of 09/30/2014  Medication Sig  . amLODipine (NORVASC) 10 MG tablet TAKE 1 TABLET BY MOUTH EVERY DAY  . aspirin 81 MG tablet Take 81 mg by mouth daily.    . fluticasone (FLONASE) 50 MCG/ACT nasal spray   . gabapentin (NEURONTIN) 300 MG capsule TAKE 2 CAPSULES BY MOUTH FOUR TIMES DAILY.  Marland Kitchen ipratropium (ATROVENT) 0.06 % nasal spray Place 1 spray into both nostrils as needed.   Marland Kitchen levothyroxine (SYNTHROID, LEVOTHROID) 112 MCG tablet TAKE 1 TABLET BY MOUTH  EVERY DAY BEFORE BREAKFAST  . LUMIGAN 0.01 % SOLN Place 1 drop into the left eye at bedtime.  . metoprolol (LOPRESSOR) 50 MG tablet Take 1 tablet (50 mg total) by mouth 2 (two) times daily.  Marland Kitchen omeprazole (PRILOSEC) 20 MG capsule Take 1 capsule (20 mg total) by mouth 2 (two) times daily.  . potassium chloride SA (K-DUR,KLOR-CON) 20 MEQ tablet Take one tablet every other day.  . traMADol (ULTRAM) 50 MG tablet One daily prn  . ursodiol (ACTIGALL) 300 MG capsule Take 300 mg by mouth. 2 morning - 1 night   . [DISCONTINUED] furosemide (LASIX) 20 MG tablet Take 1 tablet (20 mg total) by mouth daily.  . [DISCONTINUED] Probiotic Product (ALIGN) 4 MG CAPS Take 1 capsule by mouth daily.  . [DISCONTINUED] traMADol (ULTRAM) 50 MG tablet One daily prn   No facility-administered encounter medications on file as of 09/30/2014.    Review of Systems  Constitutional: Negative for appetite change and unexpected weight change.  HENT: Negative for congestion and sinus pressure.   Eyes: Negative for pain and visual disturbance.  Respiratory: Negative for cough, chest tightness and shortness of breath.   Cardiovascular: Negative for chest pain, palpitations and leg swelling.  Gastrointestinal: Negative for nausea, vomiting, abdominal pain and  diarrhea.  Genitourinary: Negative for dysuria and difficulty urinating.  Musculoskeletal: Negative for back pain and joint swelling.  Skin: Negative for color change and rash.  Neurological: Negative for dizziness, light-headedness and headaches.  Hematological: Negative for adenopathy. Does not bruise/bleed easily.  Psychiatric/Behavioral: Negative for dysphoric mood and agitation.       Objective:     Blood pressure recheck:  138/72  Physical Exam  Constitutional: She is oriented to person, place, and time. She appears well-developed and well-nourished. No distress.  HENT:  Nose: Nose normal.  Mouth/Throat: Oropharynx is clear and moist.  Eyes: Right eye  exhibits no discharge. Left eye exhibits no discharge. No scleral icterus.  Neck: Neck supple. No thyromegaly present.  Cardiovascular: Normal rate and regular rhythm.   Pulmonary/Chest: Breath sounds normal. No accessory muscle usage. No tachypnea. No respiratory distress. She has no decreased breath sounds. She has no wheezes. She has no rhonchi. Right breast exhibits no inverted nipple, no mass, no nipple discharge and no tenderness (no axillary adenopathy). Left breast exhibits no inverted nipple, no mass, no nipple discharge and no tenderness (no axilarry adenopathy).  Abdominal: Soft. Bowel sounds are normal. There is no tenderness.  Musculoskeletal: She exhibits no edema or tenderness.  Lymphadenopathy:    She has no cervical adenopathy.  Neurological: She is alert and oriented to person, place, and time.  Skin: Skin is warm. No rash noted.  Psychiatric: She has a normal mood and affect. Her behavior is normal.    BP 126/68 mmHg  Pulse 55  Temp(Src) 98.5 F (36.9 C) (Oral)  Ht 5\' 1"  (1.549 m)  Wt 141 lb 8 oz (64.184 kg)  BMI 26.75 kg/m2  SpO2 97% Wt Readings from Last 3 Encounters:  09/30/14 141 lb 8 oz (64.184 kg)  08/31/14 141 lb 1.6 oz (64.003 kg)  08/29/14 143 lb 12 oz (65.205 kg)     Lab Results  Component Value Date   WBC 6.8 09/28/2014   HGB 12.6 09/28/2014   HCT 37.5 09/28/2014   PLT 222.0 09/28/2014   GLUCOSE 88 09/28/2014   CHOL 196 09/28/2014   TRIG 139.0 09/28/2014   HDL 59.20 09/28/2014   LDLDIRECT 143.5 12/17/2012   LDLCALC 109* 09/28/2014   ALT 16 09/28/2014   AST 12 09/28/2014   NA 139 09/28/2014   K 4.7 09/28/2014   CL 104 09/28/2014   CREATININE 0.95 09/28/2014   BUN 29* 09/28/2014   CO2 29 09/28/2014   TSH 1.95 04/12/2014   INR 0.9 05/10/2014    US Abdomen Complete  09/14/2014   CLINICAL DATA:  Primary biliary cirrhosis  EXAM: ULTRASOUND ABDOMEN COMPLETE  COMPARISON:  CT abdomen and pelvis April 22, 2014 ; abdominal ultrasound October 25, 2013  FINDINGS: Gallbladder: Surgically absent.  Common bile duct: Diameter: Dilated at 13 mm, unchanged. No mass or calculus is seen in the biliary ductal system.  Liver: No focal lesion identified. Within normal limits in parenchymal echogenicity. There is no intrahepatic biliary duct dilatation.  IVC: No abnormality visualized.  Pancreas: Visualized portion unremarkable. In the visualized portions of the pancreas, there is no mass or inflammatory focus. Pancreatic duct is marginally prominent at just under 3 mm in the head region.  Spleen: Size and appearance within normal limits.  Right Kidney: Length: 10.5 cm. Echogenicity within normal limits. No hydronephrosis visualized. There is a simple cyst in the upper pole region measuring 3.9 x 4.1 x 4.4 cm, noted previously.  Left Kidney: Length: 9.4 cm. Echogenicity  within normal limits. No mass or hydronephrosis visualized.  Abdominal aorta: No aneurysm visualized.  Other findings: No demonstrable ascites.  IMPRESSION: Persistent dilatation of the common hepatic duct, stable. No mass or calculus seen. Slight prominence of the pancreatic duct in the head region without mass or inflammatory focus. Gallbladder absent. Simple cyst arising from upper pole right kidney.   Electronically Signed   By: Lowella Grip III M.D.   On: 09/14/2014 10:09       Assessment & Plan:   Problem List Items Addressed This Visit    Abnormal CXR    Breathing is doing well.  Plan f/u cxr.       Autoimmune hepatitis    Followed by GI.  Just had ultrasound.  Due f/u with Dr [name] 10/05/14.  He ordered the ultrasound.       Relevant Orders   Hepatic function panel   CAD (coronary artery disease) - Primary    Heart cath revealed 60% left cfx lesion.  Elected Conservation officer, nature.  Did well with cardiac rehab.  Followed by cardiology.       GERD (gastroesophageal reflux disease)    Symptoms controlled on omeprazole.       Health care maintenance    Physical today  09/30/14.  Mammogram 06/03/14 - Birads I.  Colonoscopy 03/19/05 - normal.        Hypercholesterolemia    Low cholesterol diet and exercise.  Last LDL improved.  Hold on statin medication given liver issues.  Follow.   Lab Results  Component Value Date   CHOL 196 09/28/2014   HDL 59.20 09/28/2014   LDLCALC 109* 09/28/2014   LDLDIRECT 143.5 12/17/2012   TRIG 139.0 09/28/2014   CHOLHDL 3 09/28/2014        Relevant Orders   Lipid panel   Hypertension    Blood pressure under good control.  Continue same medication regimen.  Follow pressures.  Follow metabolic panel.        Relevant Orders   Basic metabolic panel   Hypothyroidism    On thyroid replacement.  Follow tsh.       Renal insufficiency    Cr .95 09/28/14.  Follow.        Other Visit Diagnoses    Hyperglycemia        Relevant Orders    Hemoglobin A1c        Einar Pheasant, MD

## 2014-09-30 NOTE — Progress Notes (Signed)
Pre visit review using our clinic review tool, if applicable. No additional management support is needed unless otherwise documented below in the visit note. 

## 2014-10-02 ENCOUNTER — Encounter: Payer: Self-pay | Admitting: Internal Medicine

## 2014-10-02 DIAGNOSIS — R9389 Abnormal findings on diagnostic imaging of other specified body structures: Secondary | ICD-10-CM | POA: Insufficient documentation

## 2014-10-02 NOTE — Assessment & Plan Note (Signed)
Physical today 09/30/14.  Mammogram 06/03/14 - Birads I.  Colonoscopy 03/19/05 - normal.

## 2014-10-02 NOTE — Assessment & Plan Note (Signed)
Cr .95 09/28/14.  Follow.

## 2014-10-02 NOTE — Assessment & Plan Note (Signed)
On thyroid replacement.  Follow tsh.  

## 2014-10-02 NOTE — Assessment & Plan Note (Addendum)
Low cholesterol diet and exercise.  Last LDL improved.  Hold on statin medication given liver issues.  Follow.   Lab Results  Component Value Date   CHOL 196 09/28/2014   HDL 59.20 09/28/2014   LDLCALC 109* 09/28/2014   LDLDIRECT 143.5 12/17/2012   TRIG 139.0 09/28/2014   CHOLHDL 3 09/28/2014

## 2014-10-02 NOTE — Assessment & Plan Note (Signed)
Followed by GI.  Just had ultrasound.  Due f/u with Dr [name] 10/05/14.  He ordered the ultrasound.

## 2014-10-02 NOTE — Assessment & Plan Note (Signed)
Blood pressure under good control.  Continue same medication regimen.  Follow pressures.  Follow metabolic panel.   

## 2014-10-02 NOTE — Assessment & Plan Note (Signed)
Symptoms controlled on omeprazole.   

## 2014-10-02 NOTE — Assessment & Plan Note (Signed)
Breathing is doing well.  Plan f/u cxr.

## 2014-10-02 NOTE — Assessment & Plan Note (Signed)
Heart cath revealed 60% left cfx lesion.  Elected Conservation officer, [employer].  Did well with cardiac rehab.  Followed by cardiology.

## 2014-10-04 DIAGNOSIS — D329 Benign neoplasm of meninges, unspecified: Secondary | ICD-10-CM | POA: Diagnosis not present

## 2014-10-09 ENCOUNTER — Other Ambulatory Visit: Payer: Self-pay | Admitting: Internal Medicine

## 2014-10-18 ENCOUNTER — Other Ambulatory Visit (INDEPENDENT_AMBULATORY_CARE_PROVIDER_SITE_OTHER): Payer: Medicare Other

## 2014-10-18 ENCOUNTER — Other Ambulatory Visit: Payer: Self-pay | Admitting: Internal Medicine

## 2014-10-18 DIAGNOSIS — E7800 Pure hypercholesterolemia, unspecified: Secondary | ICD-10-CM

## 2014-10-18 DIAGNOSIS — K754 Autoimmune hepatitis: Secondary | ICD-10-CM

## 2014-10-18 DIAGNOSIS — I10 Essential (primary) hypertension: Secondary | ICD-10-CM

## 2014-10-18 DIAGNOSIS — R739 Hyperglycemia, unspecified: Secondary | ICD-10-CM | POA: Diagnosis not present

## 2014-10-18 DIAGNOSIS — E871 Hypo-osmolality and hyponatremia: Secondary | ICD-10-CM

## 2014-10-18 LAB — BASIC METABOLIC PANEL
BUN: 24 mg/dL — ABNORMAL HIGH (ref 6–23)
CALCIUM: 9.5 mg/dL (ref 8.4–10.5)
CHLORIDE: 99 meq/L (ref 96–112)
CO2: 28 mEq/L (ref 19–32)
CREATININE: 0.99 mg/dL (ref 0.40–1.20)
GFR: 56.74 mL/min — AB (ref >60.00)
GLUCOSE: 143 mg/dL — AB (ref 70–99)
Potassium: 4.6 mEq/L (ref 3.5–5.1)
Sodium: 134 mEq/L — ABNORMAL LOW (ref 135–145)

## 2014-10-18 LAB — HEPATIC FUNCTION PANEL
ALBUMIN: 3.9 g/dL (ref 3.5–5.2)
ALK PHOS: 86 U/L (ref 39–117)
ALT: 18 U/L (ref 0–35)
AST: 14 U/L (ref 0–37)
BILIRUBIN DIRECT: 0.1 mg/dL (ref 0.0–0.3)
BILIRUBIN TOTAL: 0.4 mg/dL (ref 0.2–1.2)
TOTAL PROTEIN: 6.8 g/dL (ref 6.0–8.3)

## 2014-10-18 LAB — LIPID PANEL
CHOL/HDL RATIO: 3
Cholesterol: 191 mg/dL (ref 0–200)
HDL: 56.4 mg/dL (ref >39.00)
LDL Cholesterol: 110 mg/dL — ABNORMAL HIGH (ref 0–99)
NONHDL: 134.36
TRIGLYCERIDES: 122 mg/dL (ref 0.0–149.0)
VLDL: 24.4 mg/dL (ref 0.0–40.0)

## 2014-10-18 LAB — HEMOGLOBIN A1C: HEMOGLOBIN A1C: 5.9 % (ref 4.6–6.5)

## 2014-10-18 NOTE — Progress Notes (Signed)
Order placed for f/u sodium.  ?

## 2014-10-19 ENCOUNTER — Other Ambulatory Visit: Payer: Self-pay | Admitting: Internal Medicine

## 2014-10-25 ENCOUNTER — Other Ambulatory Visit (INDEPENDENT_AMBULATORY_CARE_PROVIDER_SITE_OTHER): Payer: Medicare Other

## 2014-10-25 DIAGNOSIS — E871 Hypo-osmolality and hyponatremia: Secondary | ICD-10-CM

## 2014-10-25 LAB — SODIUM: Sodium: 136 mEq/L (ref 135–145)

## 2014-11-07 DIAGNOSIS — M7541 Impingement syndrome of right shoulder: Secondary | ICD-10-CM | POA: Diagnosis not present

## 2014-11-15 DIAGNOSIS — M810 Age-related osteoporosis without current pathological fracture: Secondary | ICD-10-CM | POA: Diagnosis not present

## 2014-12-06 DIAGNOSIS — M810 Age-related osteoporosis without current pathological fracture: Secondary | ICD-10-CM | POA: Diagnosis not present

## 2014-12-07 ENCOUNTER — Other Ambulatory Visit: Payer: Self-pay | Admitting: Internal Medicine

## 2014-12-13 ENCOUNTER — Other Ambulatory Visit: Payer: Self-pay | Admitting: Internal Medicine

## 2014-12-21 DIAGNOSIS — H401121 Primary open-angle glaucoma, left eye, mild stage: Secondary | ICD-10-CM | POA: Diagnosis not present

## 2014-12-30 DIAGNOSIS — H401121 Primary open-angle glaucoma, left eye, mild stage: Secondary | ICD-10-CM | POA: Diagnosis not present

## 2015-01-06 ENCOUNTER — Ambulatory Visit: Payer: Medicare Other | Admitting: Internal Medicine

## 2015-01-06 DIAGNOSIS — H401131 Primary open-angle glaucoma, bilateral, mild stage: Secondary | ICD-10-CM | POA: Diagnosis not present

## 2015-01-11 ENCOUNTER — Other Ambulatory Visit: Payer: Self-pay | Admitting: Internal Medicine

## 2015-01-11 ENCOUNTER — Other Ambulatory Visit: Payer: Self-pay | Admitting: *Deleted

## 2015-01-11 MED ORDER — GABAPENTIN 300 MG PO CAPS: 600.0000 mg | ORAL_CAPSULE | Freq: Four times a day (QID) | ORAL | Status: DC

## 2015-01-16 ENCOUNTER — Encounter: Payer: Self-pay | Admitting: Internal Medicine

## 2015-01-16 ENCOUNTER — Ambulatory Visit (INDEPENDENT_AMBULATORY_CARE_PROVIDER_SITE_OTHER): Payer: Medicare Other | Admitting: Internal Medicine

## 2015-01-16 VITALS — BP 120/70 | HR 61 | Temp 98.2°F | Resp 18 | Ht 61.0 in | Wt 142.0 lb

## 2015-01-16 DIAGNOSIS — I2510 Atherosclerotic heart disease of native coronary artery without angina pectoris: Secondary | ICD-10-CM | POA: Diagnosis not present

## 2015-01-16 DIAGNOSIS — Z23 Encounter for immunization: Secondary | ICD-10-CM

## 2015-01-16 DIAGNOSIS — K754 Autoimmune hepatitis: Secondary | ICD-10-CM

## 2015-01-16 DIAGNOSIS — E039 Hypothyroidism, unspecified: Secondary | ICD-10-CM

## 2015-01-16 DIAGNOSIS — R739 Hyperglycemia, unspecified: Secondary | ICD-10-CM

## 2015-01-16 DIAGNOSIS — K219 Gastro-esophageal reflux disease without esophagitis: Secondary | ICD-10-CM | POA: Diagnosis not present

## 2015-01-16 DIAGNOSIS — I10 Essential (primary) hypertension: Secondary | ICD-10-CM | POA: Diagnosis not present

## 2015-01-16 DIAGNOSIS — E7800 Pure hypercholesterolemia, unspecified: Secondary | ICD-10-CM

## 2015-01-16 MED ORDER — AMLODIPINE BESYLATE 10 MG PO TABS: 10.0000 mg | ORAL_TABLET | Freq: Every day | ORAL | Status: DC

## 2015-01-16 MED ORDER — METOPROLOL TARTRATE 50 MG PO TABS: 50.0000 mg | ORAL_TABLET | Freq: Two times a day (BID) | ORAL | Status: DC

## 2015-01-16 MED ORDER — FUROSEMIDE 20 MG PO TABS: ORAL_TABLET | ORAL | Status: DC

## 2015-01-16 MED ORDER — OMEPRAZOLE 20 MG PO CPDR: 20.0000 mg | DELAYED_RELEASE_CAPSULE | Freq: Two times a day (BID) | ORAL | Status: DC

## 2015-01-16 MED ORDER — TRAMADOL HCL 50 MG PO TABS: ORAL_TABLET | ORAL | Status: DC

## 2015-01-16 NOTE — Assessment & Plan Note (Signed)
Heart cath revealed 60% left cfx lesion.  Elected Conservation officer, nature.  Is exercising.  Continue to f/u with cardiology.

## 2015-01-16 NOTE — Assessment & Plan Note (Signed)
Followed by GI.  Stable.   

## 2015-01-16 NOTE — Assessment & Plan Note (Signed)
On thyroid replacement.  Follow tsh.  

## 2015-01-16 NOTE — Progress Notes (Signed)
Pre-visit discussion using our clinic review tool. No additional management support is needed unless otherwise documented below in the visit note.  

## 2015-01-16 NOTE — Assessment & Plan Note (Signed)
Blood pressure under good control.  Continue same medication regimen.  Follow pressures.  Follow metabolic panel.   

## 2015-01-16 NOTE — Progress Notes (Signed)
Patient ID: DAMARYS LOCKREM, female   DOB: [birth year], 81 y.o.   MRN: [id number]   Subjective:    Patient ID: [name], female    DOB: [birth year], 81 y.o.   MRN: [id number]  HPI  Patient with past history of hypercholesterolemia, CAD s/p non ST elevation MI, neuropathy, autoimmune hepatitis and hypertension.  She comes in today to follow up on these issues.  She is going to exercise through heart track.  No chest pain or tightness with increased activity or exertion.  No sob.  No acid reflux.  No abdominal pain or cramping.  Bowels stable.  She is seeing Dr [name] for her liver.  Stable.     Past Medical History  Diagnosis Date  . Hypertension   . 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 Fibrocystic breast disease   . Osteoarthritis   . Inflammatory arthritis ([place])   . Autoimmune hepatitis ([place])     followed by Dr [name]  . Neuropathy ([place])   . History of colon polyps 2000  . MI (myocardial infarction) ([place])   . Thyroid disease   . CHF (congestive heart failure) ([place])   . Coronary artery disease     Non-ST elevation myocardial infarction in March 2016. Cardiac catheterization showed 60% ostial left circumflex hazy stenosis which was possibly the culprit. Mild LAD/RCA disease. EF 60% by echo.  . Hypercholesterolemia    Past Surgical History  Procedure Laterality Date  . Appendectomy    . Billroth    . Colonoscopy  2009  . Cholecystectomy  1998  . Upper gi endoscopy  2004  . Abdominal hysterectomy  1963    partial, secondary to fibroids  . Cardiac catheterization  05/12/14    [facility]   Family History  Problem Relation Age of Onset  . Stroke Mother   . Esophageal cancer Brother     also had lung cancer  . Breast cancer Neg Hx   . Colon cancer Neg Hx    Social History   Social History  . Marital Status: Widowed    Spouse Name: N/A  . Number of Children: N/A  . Years of Education: N/A   Social History Main Topics   . Smoking status: Never Smoker   . Smokeless tobacco: Never Used  . Alcohol Use: No  . Drug Use: No  . Sexual Activity: Not Asked   Other Topics Concern  . None   Social History Narrative    Outpatient Encounter Prescriptions as of 01/16/2015  Medication Sig  . amLODipine (NORVASC) 10 MG tablet Take 1 tablet (10 mg total) by mouth daily.  Marland Kitchen aspirin 81 MG tablet Take 81 mg by mouth daily.    . fluticasone (FLONASE) 50 MCG/ACT nasal spray   . furosemide (LASIX) 20 MG tablet TAKE 1 TABLET(20 MG) BY MOUTH DAILY  . gabapentin (NEURONTIN) 300 MG capsule TAKE 2 CAPSULES(600 MG) BY MOUTH FOUR TIMES DAILY  . ipratropium (ATROVENT) 0.06 % nasal spray Place 1 spray into both nostrils as needed.   Marland Kitchen levothyroxine (SYNTHROID, LEVOTHROID) 112 MCG tablet TAKE 1 TABLET BY MOUTH EVERY DAY BEFORE BREAKFAST  . LUMIGAN 0.01 % SOLN Place 1 drop into the left eye at bedtime.  . metoprolol (LOPRESSOR) 50 MG tablet Take 1 tablet (50 mg total) by mouth 2 (two) times daily.  Marland Kitchen omeprazole (PRILOSEC) 20 MG capsule Take 1 capsule (20 mg total) by mouth 2 (two) times daily.  Marland Kitchen  potassium chloride SA (K-DUR,KLOR-CON) 20 MEQ tablet TAKE 1 TABLET BY MOUTH EVERY OTHER DAY  . traMADol (ULTRAM) 50 MG tablet One daily prn  . ursodiol (ACTIGALL) 300 MG capsule Take 300 mg by mouth. 2 morning - 1 night   . [DISCONTINUED] amLODipine (NORVASC) 10 MG tablet TAKE 1 TABLET BY MOUTH EVERY DAY  . [DISCONTINUED] furosemide (LASIX) 20 MG tablet TAKE 1 TABLET(20 MG) BY MOUTH DAILY  . [DISCONTINUED] metoprolol (LOPRESSOR) 50 MG tablet TAKE 1 TABLET BY MOUTH TWICE DAILY  . [DISCONTINUED] omeprazole (PRILOSEC) 20 MG capsule Take 1 capsule (20 mg total) by mouth 2 (two) times daily.  . [DISCONTINUED] traMADol (ULTRAM) 50 MG tablet One daily prn  . [DISCONTINUED] furosemide (LASIX) 20 MG tablet TAKE 1 TABLET(20 MG) BY MOUTH DAILY   No facility-administered encounter medications on file as of 01/16/2015.    Review of Systems    Constitutional: Negative for appetite change and unexpected weight change.  HENT: Negative for congestion and sinus pressure.   Eyes: Negative for pain and discharge.  Respiratory: Negative for cough, chest tightness and shortness of breath.   Cardiovascular: Negative for chest pain, palpitations and leg swelling.  Gastrointestinal: Negative for nausea, vomiting, abdominal pain and diarrhea.  Genitourinary: Negative for dysuria and difficulty urinating.  Musculoskeletal: Negative for joint swelling and neck pain.  Skin: Negative for color change and rash.  Neurological: Negative for dizziness, light-headedness and headaches.  Psychiatric/Behavioral: Negative for dysphoric mood and agitation.       Objective:     Blood pressure rechecked by me:  134/64  Physical Exam  Constitutional: She appears well-developed and well-nourished. No distress.  HENT:  Nose: Nose normal.  Mouth/Throat: Oropharynx is clear and moist.  Eyes: Conjunctivae are normal. Right eye exhibits no discharge. Left eye exhibits no discharge.  Neck: Neck supple. No thyromegaly present.  Cardiovascular: Normal rate and regular rhythm.   Pulmonary/Chest: Breath sounds normal. No respiratory distress. She has no wheezes.  Abdominal: Soft. Bowel sounds are normal. There is no tenderness.  Musculoskeletal: She exhibits no edema or tenderness.  Lymphadenopathy:    She has no cervical adenopathy.  Skin: No rash noted. No erythema.  Psychiatric: She has a normal mood and affect. Her behavior is normal.    BP 120/70 mmHg  Pulse 61  Temp(Src) 98.2 F (36.8 C) (Oral)  Resp 18  Ht 5\' 1"  (1.549 m)  Wt 142 lb (64.411 kg)  BMI 26.84 kg/m2  SpO2 98% Wt Readings from Last 3 Encounters:  01/16/15 142 lb (64.411 kg)  09/30/14 141 lb 8 oz (64.184 kg)  08/31/14 141 lb 1.6 oz (64.003 kg)     Lab Results  Component Value Date   WBC 6.8 09/28/2014   HGB 12.6 09/28/2014   HCT 37.5 09/28/2014   PLT 222.0 09/28/2014    GLUCOSE 143* 10/18/2014   CHOL 191 10/18/2014   TRIG 122.0 10/18/2014   HDL 56.40 10/18/2014   LDLDIRECT 143.5 12/17/2012   LDLCALC 110* 10/18/2014   ALT 18 10/18/2014   AST 14 10/18/2014   NA 136 10/25/2014   K 4.6 10/18/2014   CL 99 10/18/2014   CREATININE 0.99 10/18/2014   BUN 24* 10/18/2014   CO2 28 10/18/2014   TSH 1.95 04/12/2014   INR 0.9 05/10/2014   HGBA1C 5.9 10/18/2014    US Abdomen Complete  09/14/2014  CLINICAL DATA:  Primary biliary cirrhosis EXAM: ULTRASOUND ABDOMEN COMPLETE COMPARISON:  CT abdomen and pelvis April 22, 2014 ; abdominal ultrasound October 25, 2013 FINDINGS: Gallbladder: Surgically absent. Common bile duct: Diameter: Dilated at 13 mm, unchanged. No mass or calculus is seen in the biliary ductal system. Liver: No focal lesion identified. Within normal limits in parenchymal echogenicity. There is no intrahepatic biliary duct dilatation. IVC: No abnormality visualized. Pancreas: Visualized portion unremarkable. In the visualized portions of the pancreas, there is no mass or inflammatory focus. Pancreatic duct is marginally prominent at just under 3 mm in the head region. Spleen: Size and appearance within normal limits. Right Kidney: Length: 10.5 cm. Echogenicity within normal limits. No hydronephrosis visualized. There is a simple cyst in the upper pole region measuring 3.9 x 4.1 x 4.4 cm, noted previously. Left Kidney: Length: 9.4 cm. Echogenicity within normal limits. No mass or hydronephrosis visualized. Abdominal aorta: No aneurysm visualized. Other findings: No demonstrable ascites. IMPRESSION: Persistent dilatation of the common hepatic duct, stable. No mass or calculus seen. Slight prominence of the pancreatic duct in the head region without mass or inflammatory focus. Gallbladder absent. Simple cyst arising from upper pole right kidney. Electronically Signed   By: Lowella Grip III M.D.   On: 09/14/2014 10:09       Assessment & Plan:   Problem List  Items Addressed This Visit    Autoimmune hepatitis ([place])    Followed by GI.  Stable.        Relevant Orders   Hepatic function panel   CAD (coronary artery disease)    Heart cath revealed 60% left cfx lesion.  Elected Conservation officer, nature.  Is exercising.  Continue to f/u with cardiology.        Relevant Medications   amLODipine (NORVASC) 10 MG tablet   metoprolol (LOPRESSOR) 50 MG tablet   furosemide (LASIX) 20 MG tablet   GERD (gastroesophageal reflux disease)    Symptoms controlled on omeprazole.        Relevant Medications   omeprazole (PRILOSEC) 20 MG capsule   Hypercholesterolemia    Low cholesterol diet and exercise.  Follow lipid panel.  Unable to take statin medications secondary to her autoimmune hepatitis.  Follow.   Lab Results  Component Value Date   CHOL 191 10/18/2014   HDL 56.40 10/18/2014   LDLCALC 110* 10/18/2014   LDLDIRECT 143.5 12/17/2012   TRIG 122.0 10/18/2014   CHOLHDL 3 10/18/2014        Relevant Medications   amLODipine (NORVASC) 10 MG tablet   metoprolol (LOPRESSOR) 50 MG tablet   furosemide (LASIX) 20 MG tablet   Other Relevant Orders   Lipid panel   Hypertension    Blood pressure under good control.  Continue same medication regimen.  Follow pressures.  Follow metabolic panel.        Relevant Medications   amLODipine (NORVASC) 10 MG tablet   metoprolol (LOPRESSOR) 50 MG tablet   furosemide (LASIX) 20 MG tablet   Other Relevant Orders   Basic metabolic panel   Hypothyroidism    On thyroid replacement.  Follow tsh.       Relevant Medications   metoprolol (LOPRESSOR) 50 MG tablet   Other Relevant Orders   TSH    Other Visit Diagnoses    Encounter for immunization    -  Primary    Hyperglycemia        Relevant Orders    Hemoglobin A1c        Einar Pheasant, MD

## 2015-01-16 NOTE — Assessment & Plan Note (Signed)
Low cholesterol diet and exercise.  Follow lipid panel.  Unable to take statin medications secondary to her autoimmune hepatitis.  Follow.   Lab Results  Component Value Date   CHOL 191 10/18/2014   HDL 56.40 10/18/2014   LDLCALC 110* 10/18/2014   LDLDIRECT 143.5 12/17/2012   TRIG 122.0 10/18/2014   CHOLHDL 3 10/18/2014

## 2015-01-16 NOTE — Assessment & Plan Note (Signed)
Symptoms controlled on omeprazole.   

## 2015-01-16 NOTE — Patient Instructions (Signed)

## 2015-01-24 DIAGNOSIS — H401131 Primary open-angle glaucoma, bilateral, mild stage: Secondary | ICD-10-CM | POA: Diagnosis not present

## 2015-02-07 ENCOUNTER — Other Ambulatory Visit (INDEPENDENT_AMBULATORY_CARE_PROVIDER_SITE_OTHER): Payer: Medicare Other

## 2015-02-07 DIAGNOSIS — R739 Hyperglycemia, unspecified: Secondary | ICD-10-CM

## 2015-02-07 DIAGNOSIS — E039 Hypothyroidism, unspecified: Secondary | ICD-10-CM | POA: Diagnosis not present

## 2015-02-07 DIAGNOSIS — K754 Autoimmune hepatitis: Secondary | ICD-10-CM

## 2015-02-07 DIAGNOSIS — I10 Essential (primary) hypertension: Secondary | ICD-10-CM | POA: Diagnosis not present

## 2015-02-07 DIAGNOSIS — E7800 Pure hypercholesterolemia, unspecified: Secondary | ICD-10-CM | POA: Diagnosis not present

## 2015-02-07 LAB — LIPID PANEL
CHOLESTEROL: 202 mg/dL — AB (ref 0–200)
HDL: 62.3 mg/dL (ref >39.00)
LDL CALC: 117 mg/dL — AB (ref 0–99)
NonHDL: 140.18
TRIGLYCERIDES: 117 mg/dL (ref 0.0–149.0)
Total CHOL/HDL Ratio: 3
VLDL: 23.4 mg/dL (ref 0.0–40.0)

## 2015-02-07 LAB — BASIC METABOLIC PANEL WITH GFR
BUN: 26 mg/dL — ABNORMAL HIGH (ref 6–23)
CO2: 27 meq/L (ref 19–32)
Calcium: 9.1 mg/dL (ref 8.4–10.5)
Chloride: 104 meq/L (ref 96–112)
Creatinine, Ser: 0.98 mg/dL (ref 0.40–1.20)
GFR: 57.37 mL/min — ABNORMAL LOW
Glucose, Bld: 87 mg/dL (ref 70–99)
Potassium: 4.6 meq/L (ref 3.5–5.1)
Sodium: 139 meq/L (ref 135–145)

## 2015-02-07 LAB — HEPATIC FUNCTION PANEL
ALBUMIN: 4.1 g/dL (ref 3.5–5.2)
ALT: 25 U/L (ref 0–35)
AST: 16 U/L (ref 0–37)
Alkaline Phosphatase: 76 U/L (ref 39–117)
Bilirubin, Direct: 0.1 mg/dL (ref 0.0–0.3)
TOTAL PROTEIN: 6.8 g/dL (ref 6.0–8.3)
Total Bilirubin: 0.3 mg/dL (ref 0.2–1.2)

## 2015-02-07 LAB — TSH: TSH: 0.53 u[IU]/mL (ref 0.35–4.50)

## 2015-02-07 LAB — HEMOGLOBIN A1C: Hgb A1c MFr Bld: 5.7 % (ref 4.6–6.5)

## 2015-02-08 ENCOUNTER — Encounter: Payer: Self-pay | Admitting: *Deleted

## 2015-02-13 DIAGNOSIS — B9689 Other specified bacterial agents as the cause of diseases classified elsewhere: Secondary | ICD-10-CM | POA: Diagnosis not present

## 2015-02-13 DIAGNOSIS — J019 Acute sinusitis, unspecified: Secondary | ICD-10-CM | POA: Diagnosis not present

## 2015-02-28 ENCOUNTER — Ambulatory Visit (INDEPENDENT_AMBULATORY_CARE_PROVIDER_SITE_OTHER): Payer: Medicare Other | Admitting: Cardiovascular Disease

## 2015-02-28 ENCOUNTER — Encounter: Payer: Self-pay | Admitting: Cardiovascular Disease

## 2015-02-28 VITALS — BP 140/62 | HR 65 | Ht 63.0 in | Wt 145.5 lb

## 2015-02-28 DIAGNOSIS — I10 Essential (primary) hypertension: Secondary | ICD-10-CM

## 2015-02-28 DIAGNOSIS — E7800 Pure hypercholesterolemia, unspecified: Secondary | ICD-10-CM

## 2015-02-28 DIAGNOSIS — I5032 Chronic diastolic (congestive) heart failure: Secondary | ICD-10-CM | POA: Diagnosis not present

## 2015-02-28 DIAGNOSIS — I2510 Atherosclerotic heart disease of native coronary artery without angina pectoris: Secondary | ICD-10-CM | POA: Diagnosis not present

## 2015-02-28 NOTE — Assessment & Plan Note (Signed)
She is doing very well with no symptoms suggestive of angina. Continue medical therapy.

## 2015-02-28 NOTE — Progress Notes (Signed)
Primary care physician: Dr. Einar Pheasant  HPI  This is a pleasant 81 year old female who is here today for follow-up visit regarding coronary artery disease and chronic diastolic heart failure . She has known history of hypertension, peptic ulcer disease, fibrocystic breast disease, inflammatory arthritis, autoimmune hepatitis and neuropathy.  She had a small non-ST elevation myocardial infarction in March 2016 with acute diastolic heart failure after a GI illness.  Echocardiogram showed normal LV systolic function. Cardiac catheterization showed showed 60% ostial left circumflex hazy stenosis which was possibly the culprit. Mild LAD/RCA disease. She was treated medically. Plavix was discontinued due to rash. She has been stable since then with no recurrent ischemic events. She denies any chest pain. Dyspnea is stable. She had labs in November which overall were unremarkable.  Allergies  Allergen Reactions  . Altace [Ramipril]   . Librax [Chlordiazepoxide-Clidinium]   . Statins     Affect her liver  . Plavix [Clopidogrel Bisulfate] Rash     Current Outpatient Prescriptions on File Prior to Visit  Medication Sig Dispense Refill  . amLODipine (NORVASC) 10 MG tablet Take 1 tablet (10 mg total) by mouth daily. 90 tablet 3  . aspirin 81 MG tablet Take 81 mg by mouth daily.      . fluticasone (FLONASE) 50 MCG/ACT nasal spray     . furosemide (LASIX) 20 MG tablet TAKE 1 TABLET(20 MG) BY MOUTH DAILY 30 tablet 11  . gabapentin (NEURONTIN) 300 MG capsule TAKE 2 CAPSULES(600 MG) BY MOUTH FOUR TIMES DAILY 720 capsule 0  . ipratropium (ATROVENT) 0.06 % nasal spray Place 1 spray into both nostrils as needed.     Marland Kitchen levothyroxine (SYNTHROID, LEVOTHROID) 112 MCG tablet TAKE 1 TABLET BY MOUTH EVERY DAY BEFORE BREAKFAST 90 tablet 3  . LUMIGAN 0.01 % SOLN Place 1 drop into the left eye at bedtime.    . metoprolol (LOPRESSOR) 50 MG tablet Take 1 tablet (50 mg total) by mouth 2 (two) times daily. 180 tablet  3  . omeprazole (PRILOSEC) 20 MG capsule Take 1 capsule (20 mg total) by mouth 2 (two) times daily. 180 capsule 3  . potassium chloride SA (K-DUR,KLOR-CON) 20 MEQ tablet TAKE 1 TABLET BY MOUTH EVERY OTHER DAY 30 tablet 0  . traMADol (ULTRAM) 50 MG tablet One daily prn 60 tablet 0  . ursodiol (ACTIGALL) 300 MG capsule Take 300 mg by mouth. 2 morning - 1 night      No current facility-administered medications on file prior to visit.     Past Medical History  Diagnosis Date  . Hypertension   . 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 Fibrocystic breast disease   . Osteoarthritis   . Inflammatory arthritis ([place])   . Autoimmune hepatitis ([place])     followed by Dr [name]  . Neuropathy ([place])   . History of colon polyps 2000  . MI (myocardial infarction) ([place])   . Thyroid disease   . CHF (congestive heart failure) ([place])   . Coronary artery disease     Non-ST elevation myocardial infarction in March 2016. Cardiac catheterization showed 60% ostial left circumflex hazy stenosis which was possibly the culprit. Mild LAD/RCA disease. EF 60% by echo.  . Hypercholesterolemia      Past Surgical History  Procedure Laterality Date  . Appendectomy    . Billroth    . Colonoscopy  2009  . Cholecystectomy  1998  . Upper gi endoscopy  2004  . Abdominal hysterectomy  1963    partial, secondary to fibroids  . Cardiac catheterization  05/12/14    [facility]     Family History  Problem Relation Age of Onset  . Stroke Mother   . Esophageal cancer Brother     also had lung cancer  . Breast cancer Neg Hx   . Colon cancer Neg Hx      Social History   Social History  . Marital Status: Widowed    Spouse Name: N/A  . Number of Children: N/A  . Years of Education: N/A   Occupational History  . Not on file.   Social History Main Topics  . Smoking status: Never Smoker   . Smokeless tobacco: Never Used  . Alcohol Use: No  . Drug Use: No    . Sexual Activity: Not on file   Other Topics Concern  . Not on file   Social History Narrative      PHYSICAL EXAM   BP 140/62 mmHg  Pulse 65  Ht 5\' 3"  (1.6 m)  Wt 145 lb 8 oz (65.998 kg)  BMI 25.78 kg/m2 Constitutional: She is oriented to person, place, and time. She appears well-developed and well-nourished. No distress.  HENT: No nasal discharge.  Head: Normocephalic and atraumatic.  Eyes: Pupils are equal and round. No discharge.  Neck: Normal range of motion. Neck supple. No JVD present. No thyromegaly present.  Cardiovascular: Normal rate, regular rhythm, normal heart sounds. Exam reveals no gallop and no friction rub. No murmur heard.  Pulmonary/Chest: Effort normal and breath sounds normal. No stridor. No respiratory distress. She has no wheezes. She has no rales. She exhibits no tenderness.  Abdominal: Soft. Bowel sounds are normal. She exhibits no distension. There is no tenderness. There is no rebound and no guarding.  Musculoskeletal: Normal range of motion. She exhibits no edema and no tenderness.  Neurological: She is alert and oriented to person, place, and time. Coordination normal.  Skin: Skin is warm and dry. No rash noted. She is not diaphoretic. No erythema. No pallor.  Psychiatric: She has a normal mood and affect. Her behavior is normal. Judgment and thought content normal.   EKG: Normal sinus rhythm with no significant ST or T wave changes.  ASSESSMENT AND PLAN

## 2015-02-28 NOTE — Assessment & Plan Note (Signed)
Repeat blood pressure was 120/70. I made no changes today.

## 2015-02-28 NOTE — Patient Instructions (Signed)
Medication Instructions: Continue same medications.   Labwork: None.   Procedures/Testing: None.   Follow-Up: 6 months with Dr. Arida.   Any Additional Special Instructions Will Be Listed Below (If Applicable).   

## 2015-02-28 NOTE — Assessment & Plan Note (Signed)
She appears to be euvolemic on small dose furosemide. She has chronic leg edema worse on the right side.

## 2015-02-28 NOTE — Assessment & Plan Note (Signed)
Lab Results  Component Value Date   CHOL 202* 02/07/2015   HDL 62.30 02/07/2015   LDLCALC 117* 02/07/2015   LDLDIRECT 143.5 12/17/2012   TRIG 117.0 02/07/2015   CHOLHDL 3 02/07/2015   Continue with lifestyle changes. She is not on a statin due to history of autoimmune hepatitis.

## 2015-03-11 ENCOUNTER — Other Ambulatory Visit: Payer: Self-pay | Admitting: Internal Medicine

## 2015-03-15 ENCOUNTER — Other Ambulatory Visit: Payer: Self-pay | Admitting: Gastroenterology

## 2015-03-15 DIAGNOSIS — K743 Primary biliary cirrhosis: Secondary | ICD-10-CM

## 2015-03-15 DIAGNOSIS — R0781 Pleurodynia: Secondary | ICD-10-CM | POA: Diagnosis not present

## 2015-03-15 DIAGNOSIS — K838 Other specified diseases of biliary tract: Secondary | ICD-10-CM | POA: Diagnosis not present

## 2015-03-16 DIAGNOSIS — R0781 Pleurodynia: Secondary | ICD-10-CM | POA: Diagnosis not present

## 2015-03-16 DIAGNOSIS — K743 Primary biliary cirrhosis: Secondary | ICD-10-CM | POA: Diagnosis not present

## 2015-03-16 DIAGNOSIS — K838 Other specified diseases of biliary tract: Secondary | ICD-10-CM | POA: Diagnosis not present

## 2015-03-17 ENCOUNTER — Other Ambulatory Visit: Payer: Self-pay | Admitting: Gastroenterology

## 2015-03-17 DIAGNOSIS — K838 Other specified diseases of biliary tract: Secondary | ICD-10-CM

## 2015-03-17 DIAGNOSIS — K743 Primary biliary cirrhosis: Secondary | ICD-10-CM

## 2015-03-23 ENCOUNTER — Ambulatory Visit: Payer: Medicare Other

## 2015-03-24 DIAGNOSIS — H401131 Primary open-angle glaucoma, bilateral, mild stage: Secondary | ICD-10-CM | POA: Diagnosis not present

## 2015-03-27 ENCOUNTER — Ambulatory Visit
Admission: RE | Admit: 2015-03-27 | Discharge: 2015-03-27 | Disposition: A | Discharge status: Home / Self Care | Payer: Medicare Other | Source: Ambulatory Visit | Attending: Gastroenterology | Admitting: Gastroenterology

## 2015-03-27 DIAGNOSIS — K743 Primary biliary cirrhosis: Secondary | ICD-10-CM | POA: Diagnosis not present

## 2015-03-27 DIAGNOSIS — K838 Other specified diseases of biliary tract: Secondary | ICD-10-CM

## 2015-03-27 DIAGNOSIS — K7460 Unspecified cirrhosis of liver: Secondary | ICD-10-CM | POA: Diagnosis not present

## 2015-04-05 ENCOUNTER — Encounter: Payer: Self-pay | Admitting: Family Medicine

## 2015-04-05 ENCOUNTER — Telehealth: Payer: Self-pay

## 2015-04-05 ENCOUNTER — Ambulatory Visit (INDEPENDENT_AMBULATORY_CARE_PROVIDER_SITE_OTHER): Payer: Medicare Other | Admitting: Family Medicine

## 2015-04-05 VITALS — BP 130/62 | HR 62 | Temp 98.1°F | Ht 61.0 in | Wt 148.1 lb

## 2015-04-05 DIAGNOSIS — J069 Acute upper respiratory infection, unspecified: Secondary | ICD-10-CM

## 2015-04-05 MED ORDER — AMOXICILLIN-POT CLAVULANATE 875-125 MG PO TABS: 1.0000 | ORAL_TABLET | Freq: Two times a day (BID) | ORAL | Status: DC

## 2015-04-05 NOTE — Telephone Encounter (Signed)
Pt called stating that she has a sore throat, nasal congestion x3days. Wants appointment.

## 2015-04-05 NOTE — Progress Notes (Signed)
   Subjective:  [name], female    DOB: 25-Aug-1928  Age: 82 y.o. MRN: [id number]  CC: Headache, sinus pressure, postnasal drip, sore throat  HPI:  82 year old female with a complicated PMH including CAD, chronic diastolic heart failure, renal status, hypothyroidism, autoimmune hepatitis, hypertension, hyperlipidemia presents to clinic today with the above complaints.  Patient presents with a three-day history of the above symptoms. She states that she is been experiencing severe sinus pressure and postnasal drip.  She reports associated headache. She is also had sore throat. She's taking Coricidin for her symptoms. She denies any associated fever or chills. No shortness of breath. She's had no relief with the Coricidin. No known exacerbating factors.  Social Hx   Social History   Social History  . Marital Status: Widowed    Spouse Name: N/A  . Number of Children: N/A  . Years of Education: N/A   Social History Main Topics  . Smoking status: Never Smoker   . Smokeless tobacco: Never Used  . Alcohol Use: No  . Drug Use: No  . Sexual Activity: Not Asked   Other Topics Concern  . None   Social History Narrative   Review of Systems  Constitutional: Negative for fever and chills.  HENT: Positive for sinus pressure and sore throat.   Respiratory: Positive for cough. Negative for shortness of breath.    Objective:  BP 130/62 mmHg  Pulse 62  Temp(Src) 98.1 F (36.7 C) (Oral)  Ht 5\' 1"  (1.549 m)  Wt 148 lb 2 oz (67.189 kg)  BMI 28.00 kg/m2  SpO2 96%  BP/Weight 04/05/2015 02/28/2015 [id number]  Systolic BP [id number]  Diastolic BP 62 62 70  Wt. (Lbs) 148.13 145.5 142  BMI 28 25.78 26.84   Physical Exam  Constitutional: She appears well-developed. No distress.  HENT:  Head: Normocephalic and atraumatic.  Oropharynx with mild erythema. No exudate. Normal TMs bilaterally. Severe maxillary sinus tenderness to palpation.  Cardiovascular: Normal rate and  regular rhythm.   Pulmonary/Chest: Effort normal and breath sounds normal.  Vitals reviewed.  Lab Results  Component Value Date   WBC 6.8 09/28/2014   HGB 12.6 09/28/2014   HCT 37.5 09/28/2014   PLT 222.0 09/28/2014   GLUCOSE 87 02/07/2015   CHOL 202* 02/07/2015   TRIG 117.0 02/07/2015   HDL 62.30 02/07/2015   LDLDIRECT 143.5 12/17/2012   LDLCALC 117* 02/07/2015   ALT 25 02/07/2015   AST 16 02/07/2015   NA 139 02/07/2015   K 4.6 02/07/2015   CL 104 02/07/2015   CREATININE 0.98 02/07/2015   BUN 26* 02/07/2015   CO2 27 02/07/2015   TSH 0.53 02/07/2015   INR 0.9 05/10/2014   HGBA1C 5.7 02/07/2015    Assessment & Plan:   Problem List Items Addressed This Visit    Acute URI - Primary    New problem. I informed the patient of this is likely viral in origin. Advise use of over-the-counter Flonase and antihistamines. Augmentin given to be filled only if she fails to improve or worsens (given severe sinus pain on exam).          Meds ordered this encounter  Medications  . amoxicillin-clavulanate (AUGMENTIN) 875-125 MG tablet    Sig: Take 1 tablet by mouth 2 (two) times daily.    Dispense:  14 tablet    Refill:  0    Follow-up: PRN  [place]

## 2015-04-05 NOTE — Progress Notes (Signed)
Pre visit review using our clinic review tool, if applicable. No additional management support is needed unless otherwise documented below in the visit note. 

## 2015-04-05 NOTE — Patient Instructions (Signed)
This is likely viral.  Use Flonase and claritin (or zyrtec for your symptoms).  If you fail to improve, you can fill the antibiotic.  Take care  Dr. Lacinda Axon

## 2015-04-05 NOTE — Assessment & Plan Note (Signed)
New problem. I informed the patient of this is likely viral in origin. Advise use of over-the-counter Flonase and antihistamines. Augmentin given to be filled only if she fails to improve or worsens (given severe sinus pain on exam).

## 2015-04-13 ENCOUNTER — Other Ambulatory Visit: Payer: Self-pay | Admitting: Internal Medicine

## 2015-04-19 ENCOUNTER — Ambulatory Visit (INDEPENDENT_AMBULATORY_CARE_PROVIDER_SITE_OTHER): Payer: Medicare Other

## 2015-04-19 VITALS — BP 120/60 | HR 65 | Temp 98.1°F | Resp 14 | Ht 61.0 in | Wt 143.8 lb

## 2015-04-19 DIAGNOSIS — Z0000 Encounter for general adult medical examination without abnormal findings: Secondary | ICD-10-CM | POA: Diagnosis not present

## 2015-04-19 NOTE — Progress Notes (Signed)
Subjective:   Ruth Roth is a 82 y.o. female who presents for an Initial Medicare Annual Wellness Visit.  Review of Systems    No ROS.  Medicare Wellness Visit.  Cardiac Risk Factors include: advanced age (>63men, >73 women);hypertension     Objective:    Today's Vitals   04/19/15 1432  BP: 120/60  Pulse: 65  Temp: 98.1 F (36.7 C)  TempSrc: Oral  Resp: 14  Height: 5\' 1"  (1.549 m)  Weight: 143 lb 12.8 oz (65.227 kg)  SpO2: 98%    Current Medications (verified) Outpatient Encounter Prescriptions as of 04/19/2015  Medication Sig  . amLODipine (NORVASC) 10 MG tablet Take 1 tablet (10 mg total) by mouth daily.  Marland Kitchen aspirin 81 MG tablet Take 81 mg by mouth daily.    . fluticasone (FLONASE) 50 MCG/ACT nasal spray   . furosemide (LASIX) 20 MG tablet TAKE 1 TABLET(20 MG) BY MOUTH DAILY  . gabapentin (NEURONTIN) 300 MG capsule TAKE 2 CAPSULES(600 MG) BY MOUTH FOUR TIMES DAILY  . ipratropium (ATROVENT) 0.06 % nasal spray Place 1 spray into both nostrils as needed.   Marland Kitchen levothyroxine (SYNTHROID, LEVOTHROID) 112 MCG tablet TAKE 1 TABLET BY MOUTH EVERY DAY BEFORE BREAKFAST  . LUMIGAN 0.01 % SOLN Place 1 drop into the left eye at bedtime.  . metoprolol (LOPRESSOR) 50 MG tablet Take 1 tablet (50 mg total) by mouth 2 (two) times daily.  Marland Kitchen omeprazole (PRILOSEC) 20 MG capsule Take 1 capsule (20 mg total) by mouth 2 (two) times daily.  . traMADol (ULTRAM) 50 MG tablet One daily prn  . ursodiol (ACTIGALL) 300 MG capsule Take 300 mg by mouth. 2 morning - 1 night   . [DISCONTINUED] metoprolol (LOPRESSOR) 50 MG tablet TAKE 1 TABLET BY MOUTH TWICE DAILY  . potassium chloride SA (K-DUR,KLOR-CON) 20 MEQ tablet TAKE 1 TABLET BY MOUTH EVERY OTHER DAY (Patient not taking: Reported on 04/19/2015)  . [DISCONTINUED] amoxicillin-clavulanate (AUGMENTIN) 875-125 MG tablet Take 1 tablet by mouth 2 (two) times daily.   No facility-administered encounter medications on file as of 04/19/2015.    Allergies  (verified) Altace; Librax; Statins; and Plavix   History: Past Medical History  Diagnosis Date  . Hypertension   . 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 Fibrocystic breast disease   . Osteoarthritis   . Inflammatory arthritis ([place])   . Autoimmune hepatitis ([place])     followed by Dr [name]  . Neuropathy ([place])   . History of colon polyps 2000  . MI (myocardial infarction) ([place])   . Thyroid disease   . CHF (congestive heart failure) ([place])   . Coronary artery disease     Non-ST elevation myocardial infarction in March 2016. Cardiac catheterization showed 60% ostial left circumflex hazy stenosis which was possibly the culprit. Mild LAD/RCA disease. EF 60% by echo.  . Hypercholesterolemia    Past Surgical History  Procedure Laterality Date  . Appendectomy    . Billroth    . Colonoscopy  2009  . Cholecystectomy  1998  . Upper gi endoscopy  2004  . Abdominal hysterectomy  1963    partial, secondary to fibroids  . Cardiac catheterization  05/12/14    [facility]   Family History  Problem Relation Age of Onset  . Stroke Mother   . Esophageal cancer Brother     also had lung cancer  . Breast cancer Neg Hx   . Colon cancer  Neg Hx    Social History   Occupational History  . Not on file.   Social History Main Topics  . Smoking status: Never Smoker   . Smokeless tobacco: Never Used  . Alcohol Use: No  . Drug Use: No  . Sexual Activity: Not on file    Tobacco Counseling Counseling given: Not Answered   Activities of Daily Living In your present state of health, do you have any difficulty performing the following activities: 04/19/2015  Hearing? N  Vision? N  Difficulty concentrating or making decisions? N  Walking or climbing stairs? Y  Dressing or bathing? N  Doing errands, shopping? N  Preparing Food and eating ? N  Using the Toilet? N  In the past six months, have you accidently leaked urine? N  Do you have  problems with loss of bowel control? N  Managing your Medications? N  Managing your Finances? N  Housekeeping or managing your Housekeeping? N    Immunizations and Health Maintenance Immunization History  Administered Date(s) Administered  . Influenza,inj,Quad PF,36+ Mos 12/24/2012, 01/11/2014, 01/16/2015  . Pneumococcal Conjugate-13 02/22/2014  . Td 08/17/2010   Health Maintenance Due  Topic Date Due  . PNA vac Low Risk Adult (2 of 2 - PPSV23) 02/23/2015    Patient Care Team: Einar Pheasant, MD as PCP - General (Unknown Physician Specialty) Robert Bellow, MD (General Surgery)  Indicate any recent Medical Services you may have received from other than [facility] providers in the past year (date may be approximate).     Assessment:   This is a routine wellness examination for Ruth Roth.  The goal of the wellness visit is to assist the patient how to close the gaps in care and create a preventative care plan for the patient.   Osteoporosis risk reviewed.  Medications reviewed; taking without issues or barriers.  Safety issues reviewed; smoke detectors in the home. Firearms locked in a secure area in the home. Wears seatbelts when driving or riding with others. No violence in the home.  No identified risk were noted; The patient was oriented x 3; appropriate in dress and manner and no objective failures at ADL's or IADL's.   Chr diastolic hrt fail-stable and followed by Dr. Fletcher Anon  Autoimmune hepatitis-stable and followed by Dr. [name]   Reports having Tetanus 5 years ago.  Health maintenance updated.  ZOSTAVAX vaccine postponed.  Hx of shingles x5.  Follow up with PCP.    Pneumococcal 23 vaccine postponed until upcoming follow up, per patient request.  Patient Concerns:  Ursodiol 300mg  may need to be changed to another medication; cost has increased and may become unaffordable at 90 day supply.  Bilateral upper leg pain when walking only; onset 6 months ago.  Post nasal  drip with cough; finished 7 day dose of Augmentin 1 week ago.  Immediate follow up appointment declined.  States she would rather wait for upcoming appointment in 1 month. Encouraged to follow up with PCP sooner if concerns increase or conditions worsen.     Hearing/Vision screen Hearing Screening Comments: Passes the whisper test Vision Screening Comments: Followed by [facility] Annual visits Wears glasses Last OV 03/2015  Dietary issues and exercise activities discussed: Current Exercise Habits:: Structured exercise class, Type of exercise: calisthenics;stretching;strength training/weights;treadmill, Time (Minutes): 60, Frequency (Times/Week): 2, Weekly Exercise (Minutes/Week): 120, Intensity: Mild  Goals    . Healthy Lifestyle     Stay hydrated.  Drink plenty of fluids. Choose lean meats (chicken, [place], fish),  fruits and vegetables. Maintain exercise regiment at [facility].      Depression Screen PHQ 2/9 Scores 04/19/2015 09/30/2014 09/05/2014 09/09/2013 09/07/2012 05/10/2012 05/01/2012  PHQ - 2 Score 0 0 0 0 0 0 0  PHQ- 9 Score - - 0 - - - -    Fall Risk Fall Risk  04/19/2015 09/30/2014 09/09/2013 09/07/2012 05/01/2012  Falls in the past year? No No No No No    Cognitive Function: MMSE - Mini Mental State Exam 04/19/2015  Orientation to time 5  Orientation to Place 5  Registration 3  Attention/ Calculation 5  Recall 3  Language- name 2 objects 2  Language- repeat 1  Language- follow 3 step command 3  Language- read & follow direction 1  Write a sentence 1  Copy design 1  Total score 30    Screening Tests Health Maintenance  Topic Date Due  . PNA vac Low Risk Adult (2 of 2 - PPSV23) 02/23/2015  . ZOSTAVAX  04/18/2016 (Originally 05/29/1990)  . MAMMOGRAM  06/03/2015  . INFLUENZA VACCINE  10/10/2015  . TETANUS/TDAP  08/16/2020  . DEXA SCAN  Completed      Plan:   End of life planning; Advance aging; Advanced directives discussed. Copy of current  HCPOA/Living Will requested.    During the course of the visit, [place] was educated and counseled about the following appropriate screening and preventive services:   Vaccines to include Pneumoccal, Influenza, Hepatitis B, Td, Zostavax, HCV  Electrocardiogram  Cardiovascular disease screening  Colorectal cancer screening  Bone density screening  Diabetes screening  Glaucoma screening  Mammography/PAP  Nutrition counseling  Smoking cessation counseling  Patient Instructions (the written plan) were given to the patient.    Varney Biles, LPN   [phone]  Reviewed above information.  Pt wanted to hold on appt at this time.  Agree with plan.    Dr Nicki Reaper

## 2015-04-19 NOTE — Patient Instructions (Addendum)
Ruth Roth,  Thank you for taking time to come for your Medicare Wellness Visit.  I appreciate your ongoing commitment to your health goals. Please review the following plan we discussed and let me know if I can assist you in the future.   Health Maintenance, Female Adopting a healthy lifestyle and getting preventive care can go a long way to promote health and wellness. Talk with your health care provider about what schedule of regular examinations is right for you. This is a good chance for you to check in with your provider about disease prevention and staying healthy. In between checkups, there are plenty of things you can do on your own. Experts have done a lot of research about which lifestyle changes and preventive measures are most likely to keep you healthy. Ask your health care provider for more information. WEIGHT AND DIET  Eat a healthy diet  Be sure to include plenty of vegetables, fruits, low-fat dairy products, and lean protein.  Do not eat a lot of foods high in solid fats, added sugars, or salt.  Get regular exercise. This is one of the most important things you can do for your health.  Most adults should exercise for at least 150 minutes each week. The exercise should increase your heart rate and make you sweat (moderate-intensity exercise).  Most adults should also do strengthening exercises at least twice a week. This is in addition to the moderate-intensity exercise.  Maintain a healthy weight  Body mass index (BMI) is a measurement that can be used to identify possible weight problems. It estimates body fat based on height and weight. Your health care provider can help determine your BMI and help you achieve or maintain a healthy weight.  For females 49 years of age and older:   A BMI below 18.5 is considered underweight.  A BMI of 18.5 to 24.9 is normal.  A BMI of 25 to 29.9 is considered overweight.  A BMI of 30 and above is considered obese.  Watch levels  of cholesterol and blood lipids  You should start having your blood tested for lipids and cholesterol at 80 years of age, then have this test every 5 years.  You may need to have your cholesterol levels checked more often if:  Your lipid or cholesterol levels are high.  You are older than 80 years of age.  You are at high risk for heart disease.  CANCER SCREENING   Lung Cancer  Lung cancer screening is recommended for adults 18-79 years old who are at high risk for lung cancer because of a history of smoking.  A yearly low-dose CT scan of the lungs is recommended for people who:  Currently smoke.  Have quit within the past 15 years.  Have at least a 30-pack-year history of smoking. A pack year is smoking an average of one pack of cigarettes a day for 1 year.  Yearly screening should continue until it has been 15 years since you quit.  Yearly screening should stop if you develop a health problem that would prevent you from having lung cancer treatment.  Breast Cancer  Practice breast self-awareness. This means understanding how your breasts normally appear and feel.  It also means doing regular breast self-exams. Let your health care provider know about any changes, no matter how small.  If you are in your 20s or 30s, you should have a clinical breast exam (CBE) by a health care provider every 1-3 years as part of a  regular health exam.  If you are 40 or older, have a CBE every year. Also consider having a breast X-ray (mammogram) every year.  If you have a family history of breast cancer, talk to your health care provider about genetic screening.  If you are at high risk for breast cancer, talk to your health care provider about having an MRI and a mammogram every year.  Breast cancer gene (BRCA) assessment is recommended for women who have family members with BRCA-related cancers. BRCA-related cancers include:  Breast.  Ovarian.  Tubal.  Peritoneal  cancers.  Results of the assessment will determine the need for genetic counseling and BRCA1 and BRCA2 testing. Cervical Cancer Your health care provider may recommend that you be screened regularly for cancer of the pelvic organs (ovaries, uterus, and vagina). This screening involves a pelvic examination, including checking for microscopic changes to the surface of your cervix (Pap test). You may be encouraged to have this screening done every 3 years, beginning at age 50.  For women ages 1-65, health care providers may recommend pelvic exams and Pap testing every 3 years, or they may recommend the Pap and pelvic exam, combined with testing for human papilloma virus (HPV), every 5 years. Some types of HPV increase your risk of cervical cancer. Testing for HPV may also be done on women of any age with unclear Pap test results.  Other health care providers may not recommend any screening for nonpregnant women who are considered low risk for pelvic cancer and who do not have symptoms. Ask your health care provider if a screening pelvic exam is right for you.  If you have had past treatment for cervical cancer or a condition that could lead to cancer, you need Pap tests and screening for cancer for at least 20 years after your treatment. If Pap tests have been discontinued, your risk factors (such as having a new sexual partner) need to be reassessed to determine if screening should resume. Some women have medical problems that increase the chance of getting cervical cancer. In these cases, your health care provider may recommend more frequent screening and Pap tests. Colorectal Cancer  This type of cancer can be detected and often prevented.  Routine colorectal cancer screening usually begins at 80 years of age and continues through 80 years of age.  Your health care provider may recommend screening at an earlier age if you have risk factors for colon cancer.  Your health care provider may also  recommend using home test kits to check for hidden blood in the stool.  A small camera at the end of a tube can be used to examine your colon directly (sigmoidoscopy or colonoscopy). This is done to check for the earliest forms of colorectal cancer.  Routine screening usually begins at age 7.  Direct examination of the colon should be repeated every 5-10 years through 80 years of age. However, you may need to be screened more often if early forms of precancerous polyps or small growths are found. Skin Cancer  Check your skin from head to toe regularly.  Tell your health care provider about any new moles or changes in moles, especially if there is a change in a mole's shape or color.  Also tell your health care provider if you have a mole that is larger than the size of a pencil eraser.  Always use sunscreen. Apply sunscreen liberally and repeatedly throughout the day.  Protect yourself by wearing long sleeves, pants, a wide-brimmed hat,  and sunglasses whenever you are outside. HEART DISEASE, DIABETES, AND HIGH BLOOD PRESSURE   High blood pressure causes heart disease and increases the risk of stroke. High blood pressure is more likely to develop in:  People who have blood pressure in the high end of the normal range (130-139/85-89 mm Hg).  People who are overweight or obese.  People who are African American.  If you are 79-63 years of age, have your blood pressure checked every 3-5 years. If you are 34 years of age or older, have your blood pressure checked every year. You should have your blood pressure measured twice--once when you are at a hospital or clinic, and once when you are not at a hospital or clinic. Record the average of the two measurements. To check your blood pressure when you are not at a hospital or clinic, you can use:  An automated blood pressure machine at a pharmacy.  A home blood pressure monitor.  If you are between 70 years and 70 years old, ask your health  care provider if you should take aspirin to prevent strokes.  Have regular diabetes screenings. This involves taking a blood sample to check your fasting blood sugar level.  If you are at a normal weight and have a low risk for diabetes, have this test once every three years after 80 years of age.  If you are overweight and have a high risk for diabetes, consider being tested at a younger age or more often. PREVENTING INFECTION  Hepatitis B  If you have a higher risk for hepatitis B, you should be screened for this virus. You are considered at high risk for hepatitis B if:  You were born in a country where hepatitis B is common. Ask your health care provider which countries are considered high risk.  Your parents were born in a high-risk country, and you have not been immunized against hepatitis B (hepatitis B vaccine).  You have HIV or AIDS.  You use needles to inject street drugs.  You live with someone who has hepatitis B.  You have had sex with someone who has hepatitis B.  You get hemodialysis treatment.  You take certain medicines for conditions, including cancer, organ transplantation, and autoimmune conditions. Hepatitis C  Blood testing is recommended for:  Everyone born from [birth year] through 1963.  Anyone with known risk factors for hepatitis C. Sexually transmitted infections (STIs)  You should be screened for sexually transmitted infections (STIs) including gonorrhea and chlamydia if:  You are sexually active and are younger than 80 years of age.  You are older than 80 years of age and your health care provider tells you that you are at risk for this type of infection.  Your sexual activity has changed since you were last screened and you are at an increased risk for chlamydia or gonorrhea. Ask your health care provider if you are at risk.  If you do not have HIV, but are at risk, it may be recommended that you take a prescription medicine daily to prevent HIV  infection. This is called pre-exposure prophylaxis (PrEP). You are considered at risk if:  You are sexually active and do not regularly use condoms or know the HIV status of your partner(s).  You take drugs by injection.  You are sexually active with a partner who has HIV. Talk with your health care provider about whether you are at high risk of being infected with HIV. If you choose to begin PrEP, you should  first be tested for HIV. You should then be tested every 3 months for as long as you are taking PrEP.  PREGNANCY   If you are premenopausal and you may become pregnant, ask your health care provider about preconception counseling.  If you may become pregnant, take 400 to 800 micrograms (mcg) of folic acid every day.  If you want to prevent pregnancy, talk to your health care provider about birth control (contraception). OSTEOPOROSIS AND MENOPAUSE   Osteoporosis is a disease in which the bones lose minerals and strength with aging. This can result in serious bone fractures. Your risk for osteoporosis can be identified using a bone density scan.  If you are 69 years of age or older, or if you are at risk for osteoporosis and fractures, ask your health care provider if you should be screened.  Ask your health care provider whether you should take a calcium or vitamin D supplement to lower your risk for osteoporosis.  Menopause may have certain physical symptoms and risks.  Hormone replacement therapy may reduce some of these symptoms and risks. Talk to your health care provider about whether hormone replacement therapy is right for you.  HOME CARE INSTRUCTIONS   Schedule regular health, dental, and eye exams.  Stay current with your immunizations.   Do not use any tobacco products including cigarettes, chewing tobacco, or electronic cigarettes.  If you are pregnant, do not drink alcohol.  If you are breastfeeding, limit how much and how often you drink alcohol.  Limit  alcohol intake to no more than 1 drink per day for nonpregnant women. One drink equals 12 ounces of beer, 5 ounces of wine, or 1 ounces of hard liquor.  Do not use street drugs.  Do not share needles.  Ask your health care provider for help if you need support or information about quitting drugs.  Tell your health care provider if you often feel depressed.  Tell your health care provider if you have ever been abused or do not feel safe at home.   This information is not intended to replace advice given to you by your health care provider. Make sure you discuss any questions you have with your health care provider.   Document Released: 09/10/2010 Document Revised: 03/18/2014 Document Reviewed: 01/27/2013 Elsevier Interactive Patient Education [employer].

## 2015-04-21 ENCOUNTER — Ambulatory Visit: Payer: Medicare Other

## 2015-05-15 DIAGNOSIS — E039 Hypothyroidism, unspecified: Secondary | ICD-10-CM | POA: Diagnosis not present

## 2015-05-18 ENCOUNTER — Encounter: Payer: Self-pay | Admitting: Internal Medicine

## 2015-05-18 ENCOUNTER — Ambulatory Visit (INDEPENDENT_AMBULATORY_CARE_PROVIDER_SITE_OTHER): Payer: Medicare Other | Admitting: Internal Medicine

## 2015-05-18 VITALS — BP 124/64 | HR 67 | Temp 97.8°F | Wt 144.0 lb

## 2015-05-18 DIAGNOSIS — Z1239 Encounter for other screening for malignant neoplasm of breast: Secondary | ICD-10-CM | POA: Diagnosis not present

## 2015-05-18 DIAGNOSIS — K219 Gastro-esophageal reflux disease without esophagitis: Secondary | ICD-10-CM | POA: Diagnosis not present

## 2015-05-18 DIAGNOSIS — K754 Autoimmune hepatitis: Secondary | ICD-10-CM

## 2015-05-18 DIAGNOSIS — I2510 Atherosclerotic heart disease of native coronary artery without angina pectoris: Secondary | ICD-10-CM | POA: Diagnosis not present

## 2015-05-18 DIAGNOSIS — I10 Essential (primary) hypertension: Secondary | ICD-10-CM

## 2015-05-18 DIAGNOSIS — R739 Hyperglycemia, unspecified: Secondary | ICD-10-CM

## 2015-05-18 DIAGNOSIS — E039 Hypothyroidism, unspecified: Secondary | ICD-10-CM

## 2015-05-18 DIAGNOSIS — E7800 Pure hypercholesterolemia, unspecified: Secondary | ICD-10-CM

## 2015-05-18 NOTE — Progress Notes (Signed)
Pre visit review using our clinic review tool, if applicable. No additional management support is needed unless otherwise documented below in the visit note. 

## 2015-05-18 NOTE — Progress Notes (Signed)
Patient ID: ICIE SMATHERS, female   DOB: Dec 01, 1928, 82 y.o.   MRN: [id number]   Subjective:    Patient ID: [name], female    DOB: 1928/11/13, 82 y.o.   MRN: [id number]  HPI  Patient with past history of hypercholesterolemia, autoimmune hepatitis, CAD and hypertension.  She comes in today for a scheduled follow up.  States she is doing well. Tries to stay active.  No cardiac symptoms with increased activity or exertion.  Breathing stable.  Eating and drinking well.  Followed by GI for her liver.  Stable.  Just saw Dr Harlow Asa.  Thyroid stable.     Past Medical History  Diagnosis Date  . Hypertension   . 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 Fibrocystic breast disease   . Osteoarthritis   . Inflammatory arthritis ([place])   . Autoimmune hepatitis ([place])     followed by Dr [name]  . Neuropathy ([place])   . History of colon polyps 2000  . MI (myocardial infarction) ([place])   . Thyroid disease   . CHF (congestive heart failure) ([place])   . Coronary artery disease     Non-ST elevation myocardial infarction in March 2016. Cardiac catheterization showed 60% ostial left circumflex hazy stenosis which was possibly the culprit. Mild LAD/RCA disease. EF 60% by echo.  . Hypercholesterolemia    Past Surgical History  Procedure Laterality Date  . Appendectomy    . Billroth    . Colonoscopy  2009  . Cholecystectomy  1998  . Upper gi endoscopy  2004  . Abdominal hysterectomy  1963    partial, secondary to fibroids  . Cardiac catheterization  05/12/14    [facility]   Family History  Problem Relation Age of Onset  . Stroke Mother   . Esophageal cancer Brother     also had lung cancer  . Breast cancer Neg Hx   . Colon cancer Neg Hx    Social History   Social History  . Marital Status: Widowed    Spouse Name: N/A  . Number of Children: N/A  . Years of Education: N/A   Social History Main Topics  . Smoking status: Never Smoker   .  Smokeless tobacco: Never Used  . Alcohol Use: No  . Drug Use: No  . Sexual Activity: Not Asked   Other Topics Concern  . None   Social History Narrative    Outpatient Encounter Prescriptions as of 05/18/2015  Medication Sig  . amLODipine (NORVASC) 10 MG tablet Take 1 tablet (10 mg total) by mouth daily.  Marland Kitchen aspirin 81 MG tablet Take 81 mg by mouth daily.    . fluticasone (FLONASE) 50 MCG/ACT nasal spray   . furosemide (LASIX) 20 MG tablet TAKE 1 TABLET(20 MG) BY MOUTH DAILY  . gabapentin (NEURONTIN) 300 MG capsule TAKE 2 CAPSULES(600 MG) BY MOUTH FOUR TIMES DAILY  . ipratropium (ATROVENT) 0.06 % nasal spray Place 1 spray into both nostrils as needed.   Marland Kitchen levothyroxine (SYNTHROID, LEVOTHROID) 112 MCG tablet TAKE 1 TABLET BY MOUTH EVERY DAY BEFORE BREAKFAST  . LUMIGAN 0.01 % SOLN Place 1 drop into the left eye at bedtime.  . metoprolol (LOPRESSOR) 50 MG tablet Take 1 tablet (50 mg total) by mouth 2 (two) times daily.  Marland Kitchen omeprazole (PRILOSEC) 20 MG capsule Take 1 capsule (20 mg total) by mouth 2 (two) times daily.  . potassium chloride SA (K-DUR,KLOR-CON) 20 MEQ tablet TAKE  1 TABLET BY MOUTH EVERY OTHER DAY  . traMADol (ULTRAM) 50 MG tablet One daily prn  . ursodiol (ACTIGALL) 300 MG capsule Take 300 mg by mouth. 2 morning - 1 night    No facility-administered encounter medications on file as of 05/18/2015.    Review of Systems  Constitutional: Negative for appetite change and unexpected weight change.  HENT: Negative for congestion and sinus pressure.   Respiratory: Negative for cough, chest tightness and shortness of breath.   Cardiovascular: Negative for chest pain, palpitations and leg swelling.  Gastrointestinal: Negative for nausea, vomiting, abdominal pain and diarrhea.  Genitourinary: Negative for dysuria and difficulty urinating.  Musculoskeletal: Negative for back pain and joint swelling.  Skin: Negative for color change and rash.  Neurological: Negative for dizziness,  light-headedness and headaches.  Psychiatric/Behavioral: Negative for dysphoric mood and agitation.       Objective:    Physical Exam  Constitutional: She appears well-developed and well-nourished. No distress.  HENT:  Nose: Nose normal.  Mouth/Throat: Oropharynx is clear and moist.  Neck: Neck supple. No thyromegaly present.  Cardiovascular: Normal rate and regular rhythm.   Pulmonary/Chest: Breath sounds normal. No respiratory distress. She has no wheezes.  Abdominal: Soft. Bowel sounds are normal. There is no tenderness.  Musculoskeletal: She exhibits no edema or tenderness.  Lymphadenopathy:    She has no cervical adenopathy.  Skin: No rash noted. No erythema.  Psychiatric: She has a normal mood and affect. Her behavior is normal.    BP 124/64 mmHg  Pulse 67  Temp(Src) 97.8 F (36.6 C) (Oral)  Wt 144 lb (65.318 kg)  SpO2 97% Wt Readings from Last 3 Encounters:  05/18/15 144 lb (65.318 kg)  04/19/15 143 lb 12.8 oz (65.227 kg)  04/05/15 148 lb 2 oz (67.189 kg)     Lab Results  Component Value Date   WBC 6.8 09/28/2014   HGB 12.6 09/28/2014   HCT 37.5 09/28/2014   PLT 222.0 09/28/2014   GLUCOSE 87 02/07/2015   CHOL 202* 02/07/2015   TRIG 117.0 02/07/2015   HDL 62.30 02/07/2015   LDLDIRECT 143.5 12/17/2012   LDLCALC 117* 02/07/2015   ALT 25 02/07/2015   AST 16 02/07/2015   NA 139 02/07/2015   K 4.6 02/07/2015   CL 104 02/07/2015   CREATININE 0.98 02/07/2015   BUN 26* 02/07/2015   CO2 27 02/07/2015   TSH 0.53 02/07/2015   INR 0.9 05/10/2014   HGBA1C 5.7 02/07/2015    US Abdomen Complete  03/27/2015  CLINICAL DATA:  Follow-up CBD dilatation, primary biliary cirrhosis EXAM: ABDOMEN ULTRASOUND COMPLETE COMPARISON:  09/14/2014 and CT scan 04/22/2014, CT scan 10/17/2010 FINDINGS: Gallbladder: Surgically absent Common bile duct: Diameter: 1.4 cm in diameter probable postcholecystectomy. No significant change from prior exam. Liver: No focal hepatic mass. Minimal  increased echogenicity of the liver. Mild fatty infiltration cannot be excluded. IVC: No abnormality visualized. Pancreas: Not seen due to abundant bowel gas. Spleen: Spleen measures 5.6 cm in length. Subtle echogenic area within inferior aspect of the spleen measures 1.3 x 1.1 cm probable a hemangioma. Right Kidney: Length: 10 cm in length. No hydronephrosis. Again noted a cyst in upper pole measures 4.5 x 4.3 cm. Left Kidney: Length: 8.4 cm in length. Echogenicity within normal limits. No mass or hydronephrosis visualized. Abdominal aorta: No aneurysm visualized. Measures up to 2 cm in diameter. Other findings: None. IMPRESSION: 1. Again noted CBD dilatation probable postcholecystectomy measures 1.4 cm in diameter. On prior exam measures 1.3 cm  in diameter. 2. Question minimal fatty infiltration of the liver. 3. No splenomegaly. Question hemangioma inferior medial aspect of the spleen measures 1.3 x 1.1 cm. 4. No hydronephrosis. Again noted cyst in upper pole of the right kidney measures 4.5 cm. 5. No aortic aneurysm. Electronically Signed   By: Lahoma Crocker M.D.   On: 03/27/2015 10:21       Assessment & Plan:   Problem List Items Addressed This Visit    Autoimmune hepatitis ([place])    Followed by GI.  Stable.        Relevant Orders   Hepatic function panel   Coronary artery disease    No chest pain or tightness.  Continue risk factor modification.        GERD (gastroesophageal reflux disease)    On omeprazole.  Controlled.        Hypercholesterolemia    Low cholesterol diet and exercise.  Follow lipid panel.        Relevant Orders   Lipid panel   Hypertension    Blood pressure under good control.  Continue same medication regimen.  Follow pressures.  Follow metabolic panel.        Relevant Orders   Basic metabolic panel   Hypothyroidism    On thyroid replacement.  Follow tsh.        Relevant Orders   TSH    Other Visit Diagnoses    Screening breast examination    -  Primary      Relevant Orders    MM DIGITAL SCREENING BILATERAL    Hyperglycemia        Relevant Orders    Hemoglobin A1c        Einar Pheasant, MD

## 2015-05-21 ENCOUNTER — Encounter: Payer: Self-pay | Admitting: Internal Medicine

## 2015-05-21 NOTE — Assessment & Plan Note (Signed)
Followed by GI.  Stable.   

## 2015-05-21 NOTE — Assessment & Plan Note (Signed)
On omeprazole.  Controlled.   

## 2015-05-21 NOTE — Assessment & Plan Note (Signed)
Low cholesterol diet and exercise.  Follow lipid panel.   

## 2015-05-21 NOTE — Assessment & Plan Note (Signed)
No chest pain or tightness.  Continue risk factor modification.

## 2015-05-21 NOTE — Assessment & Plan Note (Signed)
Blood pressure under good control.  Continue same medication regimen.  Follow pressures.  Follow metabolic panel.   

## 2015-05-21 NOTE — Assessment & Plan Note (Signed)
On thyroid replacement.  Follow tsh.  

## 2015-06-01 ENCOUNTER — Telehealth: Payer: Self-pay | Admitting: Internal Medicine

## 2015-06-01 ENCOUNTER — Other Ambulatory Visit (INDEPENDENT_AMBULATORY_CARE_PROVIDER_SITE_OTHER): Payer: Medicare Other

## 2015-06-01 DIAGNOSIS — R739 Hyperglycemia, unspecified: Secondary | ICD-10-CM | POA: Diagnosis not present

## 2015-06-01 DIAGNOSIS — I10 Essential (primary) hypertension: Secondary | ICD-10-CM | POA: Diagnosis not present

## 2015-06-01 DIAGNOSIS — K754 Autoimmune hepatitis: Secondary | ICD-10-CM | POA: Diagnosis not present

## 2015-06-01 DIAGNOSIS — E039 Hypothyroidism, unspecified: Secondary | ICD-10-CM

## 2015-06-01 DIAGNOSIS — E7800 Pure hypercholesterolemia, unspecified: Secondary | ICD-10-CM

## 2015-06-01 LAB — BASIC METABOLIC PANEL
BUN: 35 mg/dL — AB (ref 6–23)
CHLORIDE: 105 meq/L (ref 96–112)
CO2: 28 meq/L (ref 19–32)
Calcium: 9.4 mg/dL (ref 8.4–10.5)
Creatinine, Ser: 1.13 mg/dL (ref 0.40–1.20)
GFR: 48.64 mL/min — ABNORMAL LOW (ref >60.00)
GLUCOSE: 87 mg/dL (ref 70–99)
POTASSIUM: 5 meq/L (ref 3.5–5.1)
Sodium: 140 mEq/L (ref 135–145)

## 2015-06-01 LAB — LIPID PANEL
CHOL/HDL RATIO: 4
Cholesterol: 224 mg/dL — ABNORMAL HIGH (ref 0–200)
HDL: 56.5 mg/dL (ref >39.00)
LDL Cholesterol: 138 mg/dL — ABNORMAL HIGH (ref 0–99)
NONHDL: 167.24
TRIGLYCERIDES: 147 mg/dL (ref 0.0–149.0)
VLDL: 29.4 mg/dL (ref 0.0–40.0)

## 2015-06-01 LAB — HEPATIC FUNCTION PANEL
ALK PHOS: 64 U/L (ref 39–117)
ALT: 15 U/L (ref 0–35)
AST: 11 U/L (ref 0–37)
Albumin: 4.3 g/dL (ref 3.5–5.2)
BILIRUBIN DIRECT: 0.1 mg/dL (ref 0.0–0.3)
Total Bilirubin: 0.4 mg/dL (ref 0.2–1.2)
Total Protein: 6.7 g/dL (ref 6.0–8.3)

## 2015-06-01 LAB — TSH: TSH: 0.65 u[IU]/mL (ref 0.35–4.50)

## 2015-06-01 LAB — HEMOGLOBIN A1C: Hgb A1c MFr Bld: 5.7 % (ref 4.6–6.5)

## 2015-06-01 MED ORDER — TRAMADOL HCL 50 MG PO TABS: ORAL_TABLET | ORAL | Status: DC

## 2015-06-01 NOTE — Telephone Encounter (Signed)
Pt needs a refill on traMADol (ULTRAM) 50 MG tablet sent to [facility] in [place],.. Please advise pt when sent.. thanks

## 2015-06-01 NOTE — Telephone Encounter (Signed)
Refilled per request.

## 2015-06-02 ENCOUNTER — Other Ambulatory Visit: Payer: Self-pay | Admitting: Internal Medicine

## 2015-06-02 DIAGNOSIS — R7989 Other specified abnormal findings of blood chemistry: Secondary | ICD-10-CM

## 2015-06-02 NOTE — Progress Notes (Signed)
Order placed for f/u labs.  

## 2015-06-05 ENCOUNTER — Ambulatory Visit: Payer: Medicare Other

## 2015-06-09 ENCOUNTER — Other Ambulatory Visit: Payer: Self-pay | Admitting: Internal Medicine

## 2015-06-09 ENCOUNTER — Ambulatory Visit
Admission: RE | Admit: 2015-06-09 | Discharge: 2015-06-09 | Disposition: A | Discharge status: Home / Self Care | Payer: Medicare Other | Source: Ambulatory Visit | Attending: Internal Medicine | Admitting: Internal Medicine

## 2015-06-09 DIAGNOSIS — Z1231 Encounter for screening mammogram for malignant neoplasm of breast: Secondary | ICD-10-CM | POA: Insufficient documentation

## 2015-06-09 DIAGNOSIS — Z1239 Encounter for other screening for malignant neoplasm of breast: Secondary | ICD-10-CM

## 2015-06-10 ENCOUNTER — Encounter: Payer: Self-pay | Admitting: *Deleted

## 2015-06-10 ENCOUNTER — Emergency Department
Admission: EM | Admit: 2015-06-10 | Discharge: 2015-06-10 | Disposition: A | Discharge status: Home / Self Care | Payer: Medicare Other | Attending: Emergency Medicine | Admitting: Emergency Medicine

## 2015-06-10 ENCOUNTER — Emergency Department: Payer: Medicare Other

## 2015-06-10 DIAGNOSIS — Z79899 Other long term (current) drug therapy: Secondary | ICD-10-CM | POA: Diagnosis not present

## 2015-06-10 DIAGNOSIS — S0181XA Laceration without foreign body of other part of head, initial encounter: Secondary | ICD-10-CM | POA: Diagnosis not present

## 2015-06-10 DIAGNOSIS — S8001XA Contusion of right knee, initial encounter: Secondary | ICD-10-CM | POA: Diagnosis not present

## 2015-06-10 DIAGNOSIS — Z7982 Long term (current) use of aspirin: Secondary | ICD-10-CM | POA: Diagnosis not present

## 2015-06-10 DIAGNOSIS — Y9389 Activity, other specified: Secondary | ICD-10-CM | POA: Diagnosis not present

## 2015-06-10 DIAGNOSIS — Y9289 Other specified places as the place of occurrence of the external cause: Secondary | ICD-10-CM | POA: Insufficient documentation

## 2015-06-10 DIAGNOSIS — W010XXA Fall on same level from slipping, tripping and stumbling without subsequent striking against object, initial encounter: Secondary | ICD-10-CM | POA: Insufficient documentation

## 2015-06-10 DIAGNOSIS — S0990XA Unspecified injury of head, initial encounter: Secondary | ICD-10-CM | POA: Diagnosis not present

## 2015-06-10 DIAGNOSIS — W19XXXA Unspecified fall, initial encounter: Secondary | ICD-10-CM

## 2015-06-10 DIAGNOSIS — S8992XA Unspecified injury of left lower leg, initial encounter: Secondary | ICD-10-CM | POA: Insufficient documentation

## 2015-06-10 DIAGNOSIS — Y998 Other external cause status: Secondary | ICD-10-CM | POA: Diagnosis not present

## 2015-06-10 DIAGNOSIS — S8991XA Unspecified injury of right lower leg, initial encounter: Secondary | ICD-10-CM | POA: Diagnosis not present

## 2015-06-10 DIAGNOSIS — I10 Essential (primary) hypertension: Secondary | ICD-10-CM | POA: Insufficient documentation

## 2015-06-10 DIAGNOSIS — M7989 Other specified soft tissue disorders: Secondary | ICD-10-CM | POA: Diagnosis not present

## 2015-06-10 MED ORDER — TRAMADOL HCL 50 MG PO TABS: 50.0000 mg | ORAL_TABLET | Freq: Four times a day (QID) | ORAL | Status: DC | PRN

## 2015-06-10 MED ORDER — ACETAMINOPHEN 325 MG PO TABS
650.0000 mg | ORAL_TABLET | Freq: Once | ORAL | Status: DC
  Filled 2015-06-10: qty 2

## 2015-06-10 MED ORDER — TRAMADOL HCL 50 MG PO TABS
ORAL_TABLET | ORAL | Status: AC
  Administered 2015-06-10: 50 mg via ORAL
  Filled 2015-06-10: qty 1

## 2015-06-10 MED ORDER — TRAMADOL HCL 50 MG PO TABS
50.0000 mg | ORAL_TABLET | Freq: Once | ORAL | Status: AC
  Administered 2015-06-10: 50 mg via ORAL

## 2015-06-10 NOTE — ED Provider Notes (Signed)
[facility] Emergency Department Provider Note  ____________________________________________  Time seen: On arrival  I have reviewed the triage vital signs and the nursing notes.   HISTORY  Chief Complaint Facial Laceration    HPI Ruth Roth is a 82 y.o. female who presents after a fall. Patient reports she tripped and fell to her knees and onto her right face. She denies LOC. She is not on blood thinners. She denies focal deficits. She does complain of mild headache and a laceration above her right eye. She complains of bruising and swelling primarily to the right knee but also slightly to the left knee. She is able to ambulate with some difficulty.    Past Medical History  Diagnosis Date  . Hypertension   . 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 Fibrocystic breast disease   . Osteoarthritis   . Inflammatory arthritis ([place])   . Autoimmune hepatitis ([place])     followed by Dr [name]  . Neuropathy ([place])   . History of colon polyps 2000  . MI (myocardial infarction) ([place])   . Thyroid disease   . CHF (congestive heart failure) ([place])   . Coronary artery disease     Non-ST elevation myocardial infarction in March 2016. Cardiac catheterization showed 60% ostial left circumflex hazy stenosis which was possibly the culprit. Mild LAD/RCA disease. EF 60% by echo.  . Hypercholesterolemia     Patient Active Problem List   Diagnosis Date Noted  . Acute URI 04/05/2015  . Abnormal CXR 10/02/2014  . Chronic diastolic heart failure ([place]) 05/24/2014  . Coronary artery disease   . Dyspnea 05/09/2014  . Renal insufficiency 05/06/2014  . Palpitations 04/17/2014  . Health care maintenance 04/17/2014  . Unspecified gastritis and gastroduodenitis without mention of hemorrhage 11/04/2012  . GERD (gastroesophageal reflux disease) 10/18/2012  . Autoimmune hepatitis ([place]) 01/20/2012  . Osteopenia 01/20/2012  .  Hypertension 01/16/2012  . Hypercholesterolemia 01/16/2012  . Hypothyroidism 04/30/2011  . Thyroiditis, lymphocytic 04/30/2011    Past Surgical History  Procedure Laterality Date  . Appendectomy    . Billroth    . Colonoscopy  2009  . Cholecystectomy  1998  . Upper gi endoscopy  2004  . Abdominal hysterectomy  1963    partial, secondary to fibroids  . Cardiac catheterization  05/12/14    [facility]    Current Outpatient Rx  Name  Route  Sig  Dispense  Refill  . amLODipine (NORVASC) 10 MG tablet   Oral   Take 1 tablet (10 mg total) by mouth daily.   90 tablet   3   . aspirin 81 MG tablet   Oral   Take 81 mg by mouth daily.           . fluticasone (FLONASE) 50 MCG/ACT nasal spray               . furosemide (LASIX) 20 MG tablet      TAKE 1 TABLET(20 MG) BY MOUTH DAILY   30 tablet   11   . gabapentin (NEURONTIN) 300 MG capsule      TAKE 2 CAPSULES(600 MG) BY MOUTH FOUR TIMES DAILY   720 capsule   3   . ipratropium (ATROVENT) 0.06 % nasal spray   Each Nare   Place 1 spray into both nostrils as needed.          Marland Kitchen levothyroxine (SYNTHROID, LEVOTHROID) 112 MCG tablet  TAKE 1 TABLET BY MOUTH EVERY DAY BEFORE BREAKFAST   90 tablet   3     **Patient requests 90 days supply**   . LUMIGAN 0.01 % SOLN   Left Eye   Place 1 drop into the left eye at bedtime.           Dispense as written.   . metoprolol (LOPRESSOR) 50 MG tablet   Oral   Take 1 tablet (50 mg total) by mouth 2 (two) times daily.   180 tablet   3   . omeprazole (PRILOSEC) 20 MG capsule   Oral   Take 1 capsule (20 mg total) by mouth 2 (two) times daily.   180 capsule   3   . potassium chloride SA (K-DUR,KLOR-CON) 20 MEQ tablet      TAKE 1 TABLET BY MOUTH EVERY OTHER DAY   30 tablet   0   . traMADol (ULTRAM) 50 MG tablet   Oral   Take 1 tablet (50 mg total) by mouth every 6 (six) hours as needed.   20 tablet   0   . ursodiol (ACTIGALL) 300 MG capsule   Oral   Take 300 mg by  mouth. 2 morning - 1 night            Allergies Altace; Haldol; Librax; Statins; and Plavix  Family History  Problem Relation Age of Onset  . Stroke Mother   . Esophageal cancer Brother     also had lung cancer  . Breast cancer Neg Hx   . Colon cancer Neg Hx     Social History Social History  Substance Use Topics  . Smoking status: Never Smoker   . Smokeless tobacco: Never Used  . Alcohol Use: No    Review of Systems  Constitutional: Negative forDizziness Eyes: Negative for visual changes. ENT: No neck pain    Musculoskeletal: Negative for back pain. Mild knee pain bilaterally Skin: Positive for laceration above right eye Neurological: Negative for  focal weakness   ____________________________________________   PHYSICAL EXAM:  VITAL SIGNS: ED Triage Vitals  Enc Vitals Group     BP 06/10/15 1652 131/70 mmHg     Pulse Rate 06/10/15 1652 60     Resp 06/10/15 1652 18     Temp 06/10/15 1652 98.7 F (37.1 C)     Temp Source 06/10/15 1652 Oral     SpO2 06/10/15 1652 95 %     Weight 06/10/15 1652 140 lb (63.504 kg)     Height 06/10/15 1652 5\' 2"  (1.575 m)     Head Cir --      Peak Flow --      Pain Score 06/10/15 1652 8     Pain Loc --      Pain Edu? --      Excl. in [place]? --      Constitutional: Alert and oriented. Well appearing and in no distress. Eyes: Conjunctivae are normal.  ENT   Head: Normocephalic   Mouth/Throat: Mucous membranes are moist. Cardiovascular: Normal rate, regular rhythm.  Respiratory: Normal respiratory effort without tachypnea nor retractions.  Gastrointestinal: Soft and non-tender in all quadrants. No distention. There is no CVA tenderness. Musculoskeletal: Nontender with normal range of motion in all extremities.Bruising and swelling primarily to the right knee, full range of motion. No bony abnormalities noted. 2+ distal pulses Neurologic:  Normal speech and language. No gross focal neurologic deficits are  appreciated. Skin:  Skin is warm, dry. 3 cm laceration just  above the right eyelid, curvilinear and well approximated. PERRLA, EOMI Psychiatric: Mood and affect are normal. Patient exhibits appropriate insight and judgment.  ____________________________________________    LABS (pertinent positives/negatives)  Labs Reviewed - No data to display  ____________________________________________     ____________________________________________    RADIOLOGY I have personally reviewed any xrays that were ordered on this patient: CT head unremarkable Knee x-ray unremarkable  ____________________________________________   PROCEDURES  Procedure(s) performed: yes  LACERATION REPAIR Performed by: Lavonia Drafts Authorized by: Lavonia Drafts Consent: Verbal consent obtained. Risks and benefits: risks, benefits and alternatives were discussed Consent given by: patient Patient identity confirmed: provided demographic data Prepped and Draped in normal sterile fashion Wound explored  Laceration Location:right orbit  Laceration Length: 3 cm  Skin closure: dermabond   Patient tolerance: Patient tolerated the procedure well with no immediate complications.    ____________________________________________   INITIAL IMPRESSION / ASSESSMENT AND PLAN / ED COURSE  Pertinent labs & imaging results that were available during my care of the patient were reviewed by me and considered in my medical decision making (see chart for details).  Patient with mechanical fall. We will obtain x-ray of the right knee and CT of the head. Tetanus is up-to-date.  Images are reassuring. Laceration repaired with Dermabond. Follow up as needed with PCP. Return precautions discussed  ____________________________________________   FINAL CLINICAL IMPRESSION(S) / ED DIAGNOSES  Final diagnoses:  Fall, initial encounter  Facial laceration, initial encounter  Knee contusion, right, initial encounter      Lavonia Drafts, MD 06/10/15 [phone]

## 2015-06-10 NOTE — Discharge Instructions (Signed)
Facial Laceration ° A facial laceration is a cut on the face. These injuries can be painful and cause bleeding. Lacerations usually heal quickly, but they need special care to reduce scarring. °DIAGNOSIS  °Your health care provider will take a medical history, ask for details about how the injury occurred, and examine the wound to determine how deep the cut is. °TREATMENT  °Some facial lacerations may not require closure. Others may not be able to be closed because of an increased risk of infection. The risk of infection and the chance for successful closure will depend on various factors, including the amount of time since the injury occurred. °The wound may be cleaned to help prevent infection. If closure is appropriate, pain medicines may be given if needed. Your health care provider will use stitches (sutures), wound glue (adhesive), or skin adhesive strips to repair the laceration. These tools bring the skin edges together to allow for faster healing and a better cosmetic outcome. If needed, you may also be given a tetanus shot. °HOME CARE INSTRUCTIONS °· Only take over-the-counter or prescription medicines as directed by your health care provider. °· Follow your health care provider's instructions for wound care. These instructions will vary depending on the technique used for closing the wound. °For Sutures: °· Keep the wound clean and dry.   °· If you were given a bandage (dressing), you should change it at least once a day. Also change the dressing if it becomes wet or dirty, or as directed by your health care provider.   °· Wash the wound with soap and water 2 times a day. Rinse the wound off with water to remove all soap. Pat the wound dry with a clean towel.   °· After cleaning, apply a thin layer of the antibiotic ointment recommended by your health care provider. This will help prevent infection and keep the dressing from sticking.   °· You may shower as usual after the first 24 hours. Do not soak the  wound in water until the sutures are removed.   °· Get your sutures removed as directed by your health care provider. With facial lacerations, sutures should usually be taken out after 4-5 days to avoid stitch marks.   °· Wait a few days after your sutures are removed before applying any makeup. °For Skin Adhesive Strips: °· Keep the wound clean and dry.   °· Do not get the skin adhesive strips wet. You may bathe carefully, using caution to keep the wound dry.   °· If the wound gets wet, pat it dry with a clean towel.   °· Skin adhesive strips will fall off on their own. You may trim the strips as the wound heals. Do not remove skin adhesive strips that are still stuck to the wound. They will fall off in time.   °For Wound Adhesive: °· You may briefly wet your wound in the shower or bath. Do not soak or scrub the wound. Do not swim. Avoid periods of heavy sweating until the skin adhesive has fallen off on its own. After showering or bathing, gently pat the wound dry with a clean towel.   °· Do not apply liquid medicine, cream medicine, ointment medicine, or makeup to your wound while the skin adhesive is in place. This may loosen the film before your wound is healed.   °· If a dressing is placed over the wound, be careful not to apply tape directly over the skin adhesive. This may cause the adhesive to be pulled off before the wound is healed.   °· Avoid   prolonged exposure to sunlight or tanning lamps while the skin adhesive is in place.  The skin adhesive will usually remain in place for 5-10 days, then naturally fall off the skin. Do not pick at the adhesive film.  After Healing: Once the wound has healed, cover the wound with sunscreen during the day for 1 full year. This can help minimize scarring. Exposure to ultraviolet light in the first year will darken the scar. It can take 1-2 years for the scar to lose its redness and to heal completely.  SEEK MEDICAL CARE IF:  You have a fever. SEEK IMMEDIATE  MEDICAL CARE IF:  You have redness, pain, or swelling around the wound.   You see ayellowish-white fluid (pus) coming from the wound.    This information is not intended to replace advice given to you by your health care provider. Make sure you discuss any questions you have with your health care provider.   Document Released: 04/04/2004 Document Revised: 03/18/2014 Document Reviewed: 10/08/2012 Elsevier Interactive Patient Education 2016 [place] A contusion is a deep bruise. Contusions are the result of a blunt injury to tissues and muscle fibers under the skin. The injury causes bleeding under the skin. The skin overlying the contusion may turn blue, purple, or yellow. Minor injuries will give you a painless contusion, but more severe contusions may stay painful and swollen for a few weeks.  CAUSES  This condition is usually caused by a blow, trauma, or direct force to an area of the body. SYMPTOMS  Symptoms of this condition include:  Swelling of the injured area.  Pain and tenderness in the injured area.  Discoloration. The area may have redness and then turn blue, purple, or yellow. DIAGNOSIS  This condition is diagnosed based on a physical exam and medical history. An X-ray, CT scan, or MRI may be needed to determine if there are any associated injuries, such as broken bones (fractures). TREATMENT  Specific treatment for this condition depends on what area of the body was injured. In general, the best treatment for a contusion is resting, icing, applying pressure to (compression), and elevating the injured area. This is often called the RICE strategy. Over-the-counter anti-inflammatory medicines may also be recommended for pain control.  HOME CARE INSTRUCTIONS   Rest the injured area.  If directed, apply ice to the injured area:  Put ice in a plastic bag.  Place a towel between your skin and the bag.  Leave the ice on for 20 minutes, 2-3 times per  day.  If directed, apply light compression to the injured area using an elastic bandage. Make sure the bandage is not wrapped too tightly. Remove and reapply the bandage as directed by your health care provider.  If possible, raise (elevate) the injured area above the level of your heart while you are sitting or lying down.  Take over-the-counter and prescription medicines only as told by your health care provider. SEEK MEDICAL CARE IF:  Your symptoms do not improve after several days of treatment.  Your symptoms get worse.  You have difficulty moving the injured area. SEEK IMMEDIATE MEDICAL CARE IF:   You have severe pain.  You have numbness in a hand or foot.  Your hand or foot turns pale or cold.   This information is not intended to replace advice given to you by your health care provider. Make sure you discuss any questions you have with your health care provider.   Document Released: 12/05/2004 Document  Revised: 11/16/2014 Document Reviewed: 07/13/2014 Elsevier Interactive Patient Education [employer].
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
## 2015-06-10 NOTE — ED Notes (Signed)
Unable to pull up E-signature. Verbal consent for D/C obtained at this time.

## 2015-06-10 NOTE — ED Notes (Signed)
NAD noted at time of D/C. Pt denies questions or concerns. Pt  Taken to the lobby at this time via wheelchair.

## 2015-06-11 ENCOUNTER — Other Ambulatory Visit: Payer: Self-pay | Admitting: Internal Medicine

## 2015-06-14 ENCOUNTER — Other Ambulatory Visit: Payer: Self-pay | Admitting: Internal Medicine

## 2015-06-15 ENCOUNTER — Other Ambulatory Visit (INDEPENDENT_AMBULATORY_CARE_PROVIDER_SITE_OTHER): Payer: Medicare Other

## 2015-06-15 DIAGNOSIS — R748 Abnormal levels of other serum enzymes: Secondary | ICD-10-CM

## 2015-06-15 DIAGNOSIS — R7989 Other specified abnormal findings of blood chemistry: Secondary | ICD-10-CM

## 2015-06-15 LAB — BASIC METABOLIC PANEL
BUN: 26 mg/dL — ABNORMAL HIGH (ref 6–23)
CHLORIDE: 104 meq/L (ref 96–112)
CO2: 29 mEq/L (ref 19–32)
Calcium: 9.5 mg/dL (ref 8.4–10.5)
Creatinine, Ser: 1.05 mg/dL (ref 0.40–1.20)
GFR: 52.94 mL/min — ABNORMAL LOW (ref >60.00)
Glucose, Bld: 94 mg/dL (ref 70–99)
POTASSIUM: 4.6 meq/L (ref 3.5–5.1)
Sodium: 139 mEq/L (ref 135–145)

## 2015-06-16 ENCOUNTER — Encounter: Payer: Self-pay | Admitting: *Deleted

## 2015-06-16 ENCOUNTER — Other Ambulatory Visit: Payer: Medicare Other

## 2015-06-22 ENCOUNTER — Telehealth: Payer: Self-pay | Admitting: *Deleted

## 2015-06-22 NOTE — Telephone Encounter (Signed)
I do not have or see any documentation that we instructed pharmacy to stop her metoprolol.  Make sure pt is not having any problems and see if she has seen any other md that would have stopped it.  If no problems, then please call pharmacy and clarify we did not stop medication.  Thanks

## 2015-06-22 NOTE — Telephone Encounter (Signed)
Spoke with the patient, she doesn't see any other doctors.  I spoke then with the pharmacy, they didn't have any notes on file, so I filled the prescription.  Thanks

## 2015-06-22 NOTE — Telephone Encounter (Signed)
Patient state that the pharmacy refused to refill her metoprolol Tartrate 50 mg, the pharmacy told her that Dr. Nicki Reaper called the pharmacy to stop the medication. Patient stated that she was under the impression that she was to continue the medication, and would like to have it refilled.  Please [place].  [place]

## 2015-06-22 NOTE — Telephone Encounter (Signed)
Dr. Nicki Reaper please advise, did you request that Metoprolol be discontinued? thanks

## 2015-06-22 NOTE — Telephone Encounter (Signed)
Attempted to call patient.  Left VM to return my call.

## 2015-06-22 NOTE — Telephone Encounter (Signed)
Pt returned call about medication. Please call/msn

## 2015-07-11 DIAGNOSIS — J069 Acute upper respiratory infection, unspecified: Secondary | ICD-10-CM | POA: Diagnosis not present

## 2015-07-11 DIAGNOSIS — I10 Essential (primary) hypertension: Secondary | ICD-10-CM | POA: Diagnosis not present

## 2015-07-28 DIAGNOSIS — H401131 Primary open-angle glaucoma, bilateral, mild stage: Secondary | ICD-10-CM | POA: Diagnosis not present

## 2015-08-24 DIAGNOSIS — M7541 Impingement syndrome of right shoulder: Secondary | ICD-10-CM | POA: Diagnosis not present

## 2015-08-29 ENCOUNTER — Encounter: Payer: Self-pay | Admitting: Cardiovascular Disease

## 2015-08-29 ENCOUNTER — Ambulatory Visit (INDEPENDENT_AMBULATORY_CARE_PROVIDER_SITE_OTHER): Payer: Medicare Other | Admitting: Cardiovascular Disease

## 2015-08-29 VITALS — BP 134/62 | HR 61 | Ht 62.0 in | Wt 142.8 lb

## 2015-08-29 DIAGNOSIS — I2510 Atherosclerotic heart disease of native coronary artery without angina pectoris: Secondary | ICD-10-CM

## 2015-08-29 NOTE — Progress Notes (Signed)
Cardiology Office Note   Date:  08/29/2015   ID:  Ruth Roth, DOB 1928-06-28, MRN [id number]  PCP:  Einar Pheasant, MD  Cardiologist:   [name], MD   Chief Complaint  Patient presents with  . Follow-up    NO OTHER COMPLAINTS.  . Edema    THINKS ITS COMING FROM THE HEAT.       History of Present Illness: Ruth Roth is a 82 y.o. female who presents for a follow-up visit regarding coronary artery disease and chronic diastolic heart failure . She has known history of hypertension, peptic ulcer disease, fibrocystic breast disease, inflammatory arthritis, autoimmune hepatitis and neuropathy.  She had a small non-ST elevation myocardial infarction in March 2016 with acute diastolic heart failure after a GI illness.  Echocardiogram showed normal LV systolic function. Cardiac catheterization showed showed 60% ostial left circumflex hazy stenosis which was possibly the culprit. Mild LAD/RCA disease. She was treated medically. Plavix was discontinued due to rash. She has been doing very well with no recurrent cardiac symptoms. No chest pain or shortness of breath. She exercises at cardiac rehabilitation twice a week. She feels much better when she stays active. She had a fall in April with facial laceration. She did not have syncope.   Past Medical History  Diagnosis Date  . Hypertension   . 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 Fibrocystic breast disease   . Osteoarthritis   . Inflammatory arthritis ([place])   . Autoimmune hepatitis ([place])     followed by Dr [name]  . Neuropathy ([place])   . History of colon polyps 2000  . MI (myocardial infarction) ([place])   . Thyroid disease   . CHF (congestive heart failure) ([place])   . Coronary artery disease     Non-ST elevation myocardial infarction in March 2016. Cardiac catheterization showed 60% ostial left circumflex hazy stenosis which was possibly the culprit. Mild LAD/RCA  disease. EF 60% by echo.  . Hypercholesterolemia     Past Surgical History  Procedure Laterality Date  . Appendectomy    . Billroth    . Colonoscopy  2009  . Cholecystectomy  1998  . Upper gi endoscopy  2004  . Abdominal hysterectomy  1963    partial, secondary to fibroids  . Cardiac catheterization  05/12/14    [facility]     Current Outpatient Prescriptions  Medication Sig Dispense Refill  . amLODipine (NORVASC) 10 MG tablet Take 1 tablet (10 mg total) by mouth daily. 90 tablet 3  . aspirin 81 MG tablet Take 81 mg by mouth daily.      . fluticasone (FLONASE) 50 MCG/ACT nasal spray     . furosemide (LASIX) 20 MG tablet TAKE 1 TABLET(20 MG) BY MOUTH DAILY 30 tablet 11  . gabapentin (NEURONTIN) 300 MG capsule TAKE 2 CAPSULES(600 MG) BY MOUTH FOUR TIMES DAILY 720 capsule 3  . ipratropium (ATROVENT) 0.06 % nasal spray Place 1 spray into both nostrils as needed.     Marland Kitchen levothyroxine (SYNTHROID, LEVOTHROID) 112 MCG tablet TAKE 1 TABLET BY MOUTH EVERY DAY BEFORE BREAKFAST 90 tablet 11  . LUMIGAN 0.01 % SOLN Place 1 drop into the left eye at bedtime.    . metoprolol (LOPRESSOR) 50 MG tablet Take 1 tablet (50 mg total) by mouth 2 (two) times daily. 180 tablet 3  . omeprazole (PRILOSEC) 20 MG capsule Take 1 capsule (20 mg total) by mouth 2 (  two) times daily. 180 capsule 3  . omeprazole (PRILOSEC) 20 MG capsule TAKE 1 CAPSULE(20 MG) BY MOUTH TWICE DAILY 180 capsule 0  . potassium chloride SA (K-DUR,KLOR-CON) 20 MEQ tablet TAKE 1 TABLET BY MOUTH EVERY OTHER DAY 30 tablet 0  . traMADol (ULTRAM) 50 MG tablet Take 1 tablet (50 mg total) by mouth every 6 (six) hours as needed. 20 tablet 0  . ursodiol (ACTIGALL) 300 MG capsule Take 300 mg by mouth. 2 morning - 1 night      No current facility-administered medications for this visit.    Allergies:   Altace; Haldol; Librax; Statins; and Plavix    Social History:  The patient  reports that she has never smoked. She has never used smokeless tobacco. She  reports that she does not drink alcohol or use illicit drugs.   Family History:  The patient's family history includes Esophageal cancer in her brother; Stroke in her mother. There is no history of Breast cancer or Colon cancer.    ROS:  Please see the history of present illness.   Otherwise, review of systems are positive for none.   All other systems are reviewed and negative.    PHYSICAL EXAM: VS:  BP 134/62 mmHg  Pulse 61  Ht 5\' 2"  (1.575 m)  Wt 142 lb 12.8 oz (64.774 kg)  BMI 26.11 kg/m2 , BMI Body mass index is 26.11 kg/(m^2). GEN: Well nourished, well developed, in no acute distress HEENT: normal Neck: no JVD, carotid bruits, or masses Cardiac: RRR; no murmurs, rubs, or gallops,no edema  Respiratory:  clear to auscultation bilaterally, normal work of breathing GI: soft, nontender, nondistended, + BS MS: no deformity or atrophy Skin: warm and dry, no rash Neuro:  Strength and sensation are intact Psych: euthymic mood, full affect   EKG:  EKG is ordered today. The ekg ordered today demonstrates normal sinus rhythm with PACs.   Recent Labs: 09/28/2014: Hemoglobin 12.6; Platelets 222.0 06/01/2015: ALT 15; TSH 0.65 06/15/2015: BUN 26*; Creatinine, Ser 1.05; Potassium 4.6; Sodium 139    Lipid Panel    Component Value Date/Time   CHOL 224* 06/01/2015 0945   TRIG 147.0 06/01/2015 0945   HDL 56.50 06/01/2015 0945   CHOLHDL 4 06/01/2015 0945   VLDL 29.4 06/01/2015 0945   LDLCALC 138* 06/01/2015 0945   LDLDIRECT 143.5 12/17/2012 0955      Wt Readings from Last 3 Encounters:  08/29/15 142 lb 12.8 oz (64.774 kg)  06/10/15 140 lb (63.504 kg)  05/18/15 144 lb (65.318 kg)       ASSESSMENT AND PLAN:  1.  Coronary artery disease involving native coronary arteries without angina: She is doing extremely well with no anginal symptoms. She feels much better when she stays active and given her cardiac history, I recommend continuation of her exercise program.  2. Chronic  diastolic heart failure: She appears to be euvolemic on small dose furosemide.  3. Essential hypertension: Blood pressure is controlled on current medications.  4. Hyperlipidemia: Not on a statin due to history of autoimmune hepatitis. Most recent LDL was 138.   Disposition:   FU with me in 1 year  Signed,  [name], MD  08/29/2015 2:36 PM    [place]

## 2015-08-29 NOTE — Patient Instructions (Signed)
Medication Instructions: Continue same medications.   Labwork: None.   Procedures/Testing: None.   Follow-Up: 1 year with Dr. Arida.   Any Additional Special Instructions Will Be Listed Below (If Applicable).     If you need a refill on your cardiac medications before your next appointment, please call your pharmacy.   

## 2015-08-31 DIAGNOSIS — K743 Primary biliary cirrhosis: Secondary | ICD-10-CM | POA: Diagnosis not present

## 2015-09-07 DIAGNOSIS — K743 Primary biliary cirrhosis: Secondary | ICD-10-CM | POA: Diagnosis not present

## 2015-09-21 ENCOUNTER — Telehealth: Payer: Self-pay | Admitting: Internal Medicine

## 2015-09-21 MED ORDER — TRAMADOL HCL 50 MG PO TABS: 50.0000 mg | ORAL_TABLET | ORAL | Status: DC | PRN

## 2015-09-21 NOTE — Telephone Encounter (Signed)
Pt would like a refill on traMADol (ULTRAM) 50 MG tablet.. Please advise

## 2015-09-21 NOTE — Telephone Encounter (Signed)
Please advise refill, last done on 06/10/2015.  thanks

## 2015-09-21 NOTE — Telephone Encounter (Signed)
rx ok'd for tramadol #30 with one refill.

## 2015-09-21 NOTE — Telephone Encounter (Signed)
Attempted to reach patient, left a VM. thanks
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
## 2015-09-21 NOTE — Telephone Encounter (Signed)
Pt called back returning your call. Thank you! °

## 2015-09-21 NOTE — Telephone Encounter (Signed)
Need to clarify if anything acute going on.  Why does she need?  Is it just to have if needed.

## 2015-09-22 ENCOUNTER — Encounter: Payer: Medicare Other | Admitting: Internal Medicine

## 2015-09-22 MED ORDER — TRAMADOL HCL 50 MG PO TABS: 50.0000 mg | ORAL_TABLET | Freq: Every day | ORAL | Status: DC

## 2015-09-22 NOTE — Telephone Encounter (Signed)
Per Dr. Nicki Reaper stated that directions should be take once a day. Pharmacy notified & directions changed in chart.

## 2015-09-22 NOTE — Telephone Encounter (Signed)
Ruth Roth from [facility] would like to verify that the doctor wants the patient to take the Tramadol prescription as follows:  One every 3 days as needed

## 2015-09-22 NOTE — Telephone Encounter (Deleted)
Elen at [facility] wanted to verify that the patient would take the Tramadol prescription: 1 every 3 days. Please return call.

## 2015-09-29 DIAGNOSIS — H401131 Primary open-angle glaucoma, bilateral, mild stage: Secondary | ICD-10-CM | POA: Diagnosis not present

## 2015-10-17 DIAGNOSIS — M7541 Impingement syndrome of right shoulder: Secondary | ICD-10-CM | POA: Diagnosis not present

## 2015-10-23 ENCOUNTER — Ambulatory Visit (INDEPENDENT_AMBULATORY_CARE_PROVIDER_SITE_OTHER): Payer: Medicare Other | Admitting: Internal Medicine

## 2015-10-23 ENCOUNTER — Encounter: Payer: Self-pay | Admitting: Internal Medicine

## 2015-10-23 VITALS — BP 120/70 | HR 67 | Temp 98.3°F | Resp 18 | Ht 61.75 in | Wt 140.2 lb

## 2015-10-23 DIAGNOSIS — I10 Essential (primary) hypertension: Secondary | ICD-10-CM

## 2015-10-23 DIAGNOSIS — I2510 Atherosclerotic heart disease of native coronary artery without angina pectoris: Secondary | ICD-10-CM | POA: Diagnosis not present

## 2015-10-23 DIAGNOSIS — I5032 Chronic diastolic (congestive) heart failure: Secondary | ICD-10-CM

## 2015-10-23 DIAGNOSIS — K219 Gastro-esophageal reflux disease without esophagitis: Secondary | ICD-10-CM

## 2015-10-23 DIAGNOSIS — Z0000 Encounter for general adult medical examination without abnormal findings: Secondary | ICD-10-CM

## 2015-10-23 DIAGNOSIS — R739 Hyperglycemia, unspecified: Secondary | ICD-10-CM | POA: Diagnosis not present

## 2015-10-23 DIAGNOSIS — K754 Autoimmune hepatitis: Secondary | ICD-10-CM | POA: Diagnosis not present

## 2015-10-23 DIAGNOSIS — E039 Hypothyroidism, unspecified: Secondary | ICD-10-CM

## 2015-10-23 DIAGNOSIS — E7800 Pure hypercholesterolemia, unspecified: Secondary | ICD-10-CM

## 2015-10-23 MED ORDER — TRAMADOL HCL 50 MG PO TABS: 50.0000 mg | ORAL_TABLET | Freq: Two times a day (BID) | ORAL | 1 refills | Status: DC | PRN

## 2015-10-23 MED ORDER — AMLODIPINE BESYLATE 10 MG PO TABS: 10.0000 mg | ORAL_TABLET | Freq: Every day | ORAL | 3 refills | Status: DC

## 2015-10-23 NOTE — Progress Notes (Signed)
Patient ID: KATHELYN SADA, female   DOB: [birth year], 82 y.o.   MRN: [id number]   Subjective:    Patient ID: [name], female    DOB: 01/15/1929, 82 y.o.   MRN: [id number]  HPI  Patient with past history of autoimmune hepatitis, CAD, hypercholesterolemia and hypertension.  She comes in today to follow up on these issues.  She saw Dr Fletcher Anon in 08/2015.  See note.  No chest pain.  Exercises at cardiac rehab.  Doing well with her exercise.  No sob.  She did fall back in the spring.  Tripped over cardboard.  Hit just above her eye.  Evaluated in ER.  See note.  Doing well.  No residual problems since the fall.  Does have increased neck and shoulder pain.  Takes tramadol.  Needs refill.  Mostly takes one per day, but some days needs two per day.  No abdominal pain or cramping.  No nausea or vomiting.  Bowels stable.  Sees Dr [name] for her liver.  Stable.     Past Medical History:  Diagnosis Date  . Autoimmune hepatitis ([place])    followed by Dr [name]  . CHF (congestive heart failure) ([place])   . Coronary artery disease    Non-ST elevation myocardial infarction in March 2016. Cardiac catheterization showed 60% ostial left circumflex hazy stenosis which was possibly the culprit. Mild LAD/RCA disease. EF 60% by echo.  . Fibrocystic breast disease   . History of colon polyps 2000  . Hypercholesterolemia   . Hypertension   . Inflammatory arthritis ([place])   . MI (myocardial infarction) ([place])   . Neuropathy ([place])   . Osteoarthritis   . 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 Thyroid disease    Past Surgical History:  Procedure Laterality Date  . ABDOMINAL HYSTERECTOMY  1963   partial, secondary to fibroids  . APPENDECTOMY    . Billroth    . CARDIAC CATHETERIZATION  05/12/14   [facility]  . CHOLECYSTECTOMY  1998  . COLONOSCOPY  2009  . UPPER GI ENDOSCOPY  2004   Family History  Problem Relation Age of Onset  . Stroke Mother   . Esophageal  cancer Brother     also had lung cancer  . Breast cancer Neg Hx   . Colon cancer Neg Hx    Social History   Social History  . Marital status: Widowed    Spouse name: N/A  . Number of children: N/A  . Years of education: N/A   Social History Main Topics  . Smoking status: Never Smoker  . Smokeless tobacco: Never Used  . Alcohol use No  . Drug use: No  . Sexual activity: Not Asked   Other Topics Concern  . None   Social History Narrative  . None    Outpatient Encounter Prescriptions as of 10/23/2015  Medication Sig  . amLODipine (NORVASC) 10 MG tablet Take 1 tablet (10 mg total) by mouth daily.  Marland Kitchen aspirin 81 MG tablet Take 81 mg by mouth daily.    Marland Kitchen gabapentin (NEURONTIN) 300 MG capsule TAKE 2 CAPSULES(600 MG) BY MOUTH FOUR TIMES DAILY  . ipratropium (ATROVENT) 0.06 % nasal spray Place 1 spray into both nostrils as needed.   Marland Kitchen levothyroxine (SYNTHROID, LEVOTHROID) 112 MCG tablet TAKE 1 TABLET BY MOUTH EVERY DAY BEFORE BREAKFAST  . LUMIGAN 0.01 % SOLN Place 1 drop into the left eye at bedtime.  . metoprolol (LOPRESSOR)  50 MG tablet Take 1 tablet (50 mg total) by mouth 2 (two) times daily.  Marland Kitchen omeprazole (PRILOSEC) 20 MG capsule Take 1 capsule (20 mg total) by mouth 2 (two) times daily.  . traMADol (ULTRAM) 50 MG tablet Take 1 tablet (50 mg total) by mouth 2 (two) times daily as needed.  . ursodiol (ACTIGALL) 300 MG capsule Take 300 mg by mouth. 2 morning - 1 night   . [DISCONTINUED] amLODipine (NORVASC) 10 MG tablet Take 1 tablet (10 mg total) by mouth daily.  . [DISCONTINUED] traMADol (ULTRAM) 50 MG tablet Take 1 tablet (50 mg total) by mouth at bedtime.  . [DISCONTINUED] traMADol (ULTRAM) 50 MG tablet Take 1 tablet (50 mg total) by mouth 2 (two) times daily as needed.  . [DISCONTINUED] fluticasone (FLONASE) 50 MCG/ACT nasal spray   . [DISCONTINUED] furosemide (LASIX) 20 MG tablet TAKE 1 TABLET(20 MG) BY MOUTH DAILY  . [DISCONTINUED] omeprazole (PRILOSEC) 20 MG capsule TAKE 1  CAPSULE(20 MG) BY MOUTH TWICE DAILY  . [DISCONTINUED] potassium chloride SA (K-DUR,KLOR-CON) 20 MEQ tablet TAKE 1 TABLET BY MOUTH EVERY OTHER DAY   No facility-administered encounter medications on file as of 10/23/2015.     Review of Systems  Constitutional: Negative for appetite change and unexpected weight change.  HENT: Negative for congestion and sinus pressure.   Eyes: Negative for pain and visual disturbance.  Respiratory: Negative for cough, chest tightness and shortness of breath.   Cardiovascular: Negative for chest pain, palpitations and leg swelling.  Gastrointestinal: Negative for abdominal pain, diarrhea, nausea and vomiting.  Genitourinary: Negative for difficulty urinating and dysuria.  Musculoskeletal: Negative for joint swelling.       Shoulder and neck pain at times.    Skin: Negative for color change and rash.  Neurological: Negative for dizziness, light-headedness and headaches.  Hematological: Negative for adenopathy. Does not bruise/bleed easily.  Psychiatric/Behavioral: Negative for agitation and dysphoric mood.       Objective:    Physical Exam  Constitutional: She is oriented to person, place, and time. She appears well-developed and well-nourished. No distress.  HENT:  Nose: Nose normal.  Mouth/Throat: Oropharynx is clear and moist.  Eyes: Right eye exhibits no discharge. Left eye exhibits no discharge. No scleral icterus.  Neck: Neck supple. No thyromegaly present.  Cardiovascular: Normal rate and regular rhythm.   Pulmonary/Chest: Breath sounds normal. No accessory muscle usage. No tachypnea. No respiratory distress. She has no decreased breath sounds. She has no wheezes. She has no rhonchi. Right breast exhibits no inverted nipple, no mass, no nipple discharge and no tenderness (no axillary adenopathy). Left breast exhibits no inverted nipple, no mass, no nipple discharge and no tenderness (no axilarry adenopathy).  Abdominal: Soft. Bowel sounds are  normal. There is no tenderness.  Musculoskeletal: She exhibits no edema or tenderness.  Lymphadenopathy:    She has no cervical adenopathy.  Neurological: She is alert and oriented to person, place, and time.  Skin: Skin is warm. No rash noted. No erythema.  Psychiatric: She has a normal mood and affect. Her behavior is normal.    BP 120/70   Pulse 67   Temp 98.3 F (36.8 C) (Oral)   Resp 18   Ht 5' 1.75" (1.568 m)   Wt 140 lb 4 oz (63.6 kg)   SpO2 96%   BMI 25.86 kg/m  Wt Readings from Last 3 Encounters:  10/23/15 140 lb 4 oz (63.6 kg)  08/29/15 142 lb 12.8 oz (64.8 kg)  06/10/15 140 lb (  63.5 kg)     Lab Results  Component Value Date   WBC 6.8 09/28/2014   HGB 12.6 09/28/2014   HCT 37.5 09/28/2014   PLT 222.0 09/28/2014   GLUCOSE 94 06/15/2015   CHOL 224 (H) 06/01/2015   TRIG 147.0 06/01/2015   HDL 56.50 06/01/2015   LDLDIRECT 143.5 12/17/2012   LDLCALC 138 (H) 06/01/2015   ALT 15 06/01/2015   AST 11 06/01/2015   NA 139 06/15/2015   K 4.6 06/15/2015   CL 104 06/15/2015   CREATININE 1.05 06/15/2015   BUN 26 (H) 06/15/2015   CO2 29 06/15/2015   TSH 0.65 06/01/2015   INR 0.9 05/10/2014   HGBA1C 5.7 06/01/2015    Ct Head Wo Contrast  Result Date: 06/10/2015 CLINICAL DATA:  [place] face first onto concrete this afternoon with laceration and bruising right eye. EXAM: CT HEAD WITHOUT CONTRAST TECHNIQUE: Contiguous axial images were obtained from the base of the skull through the vertex without intravenous contrast. COMPARISON:  04/16/2013 and 10/17/2010 FINDINGS: Ventricles, cisterns and other CSF spaces are within normal. There is no mass, mass effect, shift of midline structures or acute hemorrhage. No evidence of acute infarction. Chronic degenerative change and fragmentation of the left temporomandibular joint. Remainder of the exam is unchanged. IMPRESSION: No acute intracranial findings. Electronically Signed   By: Marin Olp M.D.   On: 06/10/2015 18:10   Dg Knee  Complete 4 Views Right  Result Date: 06/10/2015 CLINICAL DATA:  Acute right knee pain and swelling following fall. Initial encounter. EXAM: RIGHT KNEE - COMPLETE 4+ VIEW COMPARISON:  10/17/2010 and 12/30/2008 radiographs FINDINGS: Soft tissue swelling is noted. There is no evidence of definite acute fracture, subluxation or dislocation. A tiny knee effusion is present. Diffuse osteopenia and remote proximal fibular fracture again noted. IMPRESSION: Soft tissue swelling without definite acute bony abnormality. Diffuse osteopenia slightly limits sensitivity. Tiny knee effusion. Electronically Signed   By: Margarette Canada M.D.   On: 06/10/2015 18:07       Assessment & Plan:   Problem List Items Addressed This Visit    Autoimmune hepatitis ([place])    Followed by GI.  Stable.  Liver panel has been wnl.       Relevant Orders   Hepatic function panel   Chronic diastolic heart failure (HCC)    Stable.  Followed by cardiology.  No evidence of any volume overload on exam.        Relevant Medications   amLODipine (NORVASC) 10 MG tablet   Coronary artery disease    Non-ST elevation MI in 05/2014.  Cardiac cath 60% ostial left cfx and mild LAD/RCA disease.  EF 60% by echo.  Continue risk factor modification.  Unable to take statin medication seconday to her autoimmune hepatitis.  Currently doing well.  Continue f/u with cardiology.       Relevant Medications   amLODipine (NORVASC) 10 MG tablet   GERD (gastroesophageal reflux disease)    Controlled on omeprazole.        Health care maintenance    Physical today 10/23/15.  Mammogram 06/09/15 - Birads I.  Colonoscopy 03/19/05 - normal.  Continue f/u with GI.       Hypercholesterolemia    Low cholesterol diet and exercise.  Unable to take statin medication as outlined.  Follow lipid panel.       Relevant Medications   amLODipine (NORVASC) 10 MG tablet   Other Relevant Orders   Lipid panel   Hypertension  Blood pressure under good control.  Continue  same medication regimen.  Follow pressures.  Follow metabolic panel.        Relevant Medications   amLODipine (NORVASC) 10 MG tablet   Other Relevant Orders   Basic metabolic panel   CBC with Differential/Platelet   Hypothyroidism    On thyroid replacement.  Follow tsh.       Relevant Orders   TSH    Other Visit Diagnoses    Hyperglycemia    -  Primary   Relevant Orders   Hemoglobin A1c       Einar Pheasant, MD

## 2015-10-23 NOTE — Progress Notes (Signed)
Pre-visit discussion using our clinic review tool. No additional management support is needed unless otherwise documented below in the visit note.  

## 2015-10-24 ENCOUNTER — Encounter: Payer: Self-pay | Admitting: Internal Medicine

## 2015-10-24 NOTE — Assessment & Plan Note (Signed)
Controlled on omeprazole.   

## 2015-10-24 NOTE — Assessment & Plan Note (Signed)
Blood pressure under good control.  Continue same medication regimen.  Follow pressures.  Follow metabolic panel.   

## 2015-10-24 NOTE — Assessment & Plan Note (Signed)
Low cholesterol diet and exercise.  Unable to take statin medication as outlined.  Follow lipid panel.

## 2015-10-24 NOTE — Assessment & Plan Note (Signed)
On thyroid replacement.  Follow tsh.  

## 2015-10-24 NOTE — Assessment & Plan Note (Signed)
Non-ST elevation MI in 05/2014.  Cardiac cath 60% ostial left cfx and mild LAD/RCA disease.  EF 60% by echo.  Continue risk factor modification.  Unable to take statin medication seconday to her autoimmune hepatitis.  Currently doing well.  Continue f/u with cardiology.

## 2015-10-24 NOTE — Assessment & Plan Note (Signed)
Followed by GI.  Stable.  Liver panel has been wnl.

## 2015-10-24 NOTE — Assessment & Plan Note (Signed)
Stable.  Followed by cardiology.  No evidence of any volume overload on exam.

## 2015-10-24 NOTE — Assessment & Plan Note (Signed)
Physical today 10/23/15.  Mammogram 06/09/15 - Birads I.  Colonoscopy 03/19/05 - normal.  Continue f/u with GI.

## 2015-11-02 ENCOUNTER — Other Ambulatory Visit: Payer: Medicare Other

## 2015-11-08 ENCOUNTER — Other Ambulatory Visit (INDEPENDENT_AMBULATORY_CARE_PROVIDER_SITE_OTHER): Payer: Medicare Other

## 2015-11-08 DIAGNOSIS — E039 Hypothyroidism, unspecified: Secondary | ICD-10-CM

## 2015-11-08 DIAGNOSIS — R739 Hyperglycemia, unspecified: Secondary | ICD-10-CM | POA: Diagnosis not present

## 2015-11-08 DIAGNOSIS — I10 Essential (primary) hypertension: Secondary | ICD-10-CM | POA: Diagnosis not present

## 2015-11-08 DIAGNOSIS — K754 Autoimmune hepatitis: Secondary | ICD-10-CM

## 2015-11-08 DIAGNOSIS — E7800 Pure hypercholesterolemia, unspecified: Secondary | ICD-10-CM

## 2015-11-08 LAB — BASIC METABOLIC PANEL
BUN: 16 mg/dL (ref 6–23)
CHLORIDE: 99 meq/L (ref 96–112)
CO2: 31 mEq/L (ref 19–32)
Calcium: 9.1 mg/dL (ref 8.4–10.5)
Creatinine, Ser: 0.97 mg/dL (ref 0.40–1.20)
GFR: 57.95 mL/min — ABNORMAL LOW (ref >60.00)
GLUCOSE: 87 mg/dL (ref 70–99)
POTASSIUM: 4.6 meq/L (ref 3.5–5.1)
Sodium: 135 mEq/L (ref 135–145)

## 2015-11-08 LAB — CBC WITH DIFFERENTIAL/PLATELET
BASOS PCT: 0.4 % (ref 0.0–3.0)
Basophils Absolute: 0 10*3/uL (ref 0.0–0.1)
EOS PCT: 4.1 % (ref 0.0–5.0)
Eosinophils Absolute: 0.3 10*3/uL (ref 0.0–0.7)
HCT: 37.5 % (ref 36.0–46.0)
HEMOGLOBIN: 12.6 g/dL (ref 12.0–15.0)
Lymphocytes Relative: 31.1 % (ref 12.0–46.0)
Lymphs Abs: 2.5 10*3/uL (ref 0.7–4.0)
MCHC: 33.6 g/dL (ref 30.0–36.0)
MCV: 93.1 fl (ref 78.0–100.0)
MONO ABS: 0.6 10*3/uL (ref 0.1–1.0)
Monocytes Relative: 7.8 % (ref 3.0–12.0)
Neutro Abs: 4.5 10*3/uL (ref 1.4–7.7)
Neutrophils Relative %: 56.6 % (ref 43.0–77.0)
Platelets: 262 10*3/uL (ref 150.0–400.0)
RBC: 4.03 Mil/uL (ref 3.87–5.11)
RDW: 14.3 % (ref 11.5–15.5)
WBC: 8 10*3/uL (ref 4.0–10.5)

## 2015-11-08 LAB — HEPATIC FUNCTION PANEL
ALBUMIN: 4.2 g/dL (ref 3.5–5.2)
ALK PHOS: 74 U/L (ref 39–117)
ALT: 16 U/L (ref 0–35)
AST: 12 U/L (ref 0–37)
BILIRUBIN DIRECT: 0.1 mg/dL (ref 0.0–0.3)
BILIRUBIN TOTAL: 0.4 mg/dL (ref 0.2–1.2)
Total Protein: 7 g/dL (ref 6.0–8.3)

## 2015-11-08 LAB — LIPID PANEL
CHOL/HDL RATIO: 3
Cholesterol: 215 mg/dL — ABNORMAL HIGH (ref 0–200)
HDL: 69.3 mg/dL (ref >39.00)
LDL Cholesterol: 128 mg/dL — ABNORMAL HIGH (ref 0–99)
NONHDL: 145.98
Triglycerides: 92 mg/dL (ref 0.0–149.0)
VLDL: 18.4 mg/dL (ref 0.0–40.0)

## 2015-11-08 LAB — TSH: TSH: 1 u[IU]/mL (ref 0.35–4.50)

## 2015-11-08 LAB — HEMOGLOBIN A1C: Hgb A1c MFr Bld: 6 % (ref 4.6–6.5)

## 2016-01-09 ENCOUNTER — Other Ambulatory Visit: Payer: Self-pay | Admitting: Internal Medicine

## 2016-01-18 ENCOUNTER — Ambulatory Visit: Payer: Medicare Other

## 2016-01-23 ENCOUNTER — Ambulatory Visit (INDEPENDENT_AMBULATORY_CARE_PROVIDER_SITE_OTHER): Payer: Medicare Other

## 2016-01-23 DIAGNOSIS — Z23 Encounter for immunization: Secondary | ICD-10-CM

## 2016-01-25 ENCOUNTER — Other Ambulatory Visit (INDEPENDENT_AMBULATORY_CARE_PROVIDER_SITE_OTHER): Payer: Self-pay | Admitting: Vascular Surgery

## 2016-02-02 ENCOUNTER — Other Ambulatory Visit (INDEPENDENT_AMBULATORY_CARE_PROVIDER_SITE_OTHER): Payer: Self-pay | Admitting: Vascular Surgery

## 2016-02-05 NOTE — Telephone Encounter (Signed)
Gets this from primary

## 2016-02-06 DIAGNOSIS — H401131 Primary open-angle glaucoma, bilateral, mild stage: Secondary | ICD-10-CM | POA: Diagnosis not present

## 2016-02-07 ENCOUNTER — Other Ambulatory Visit: Payer: Self-pay | Admitting: Internal Medicine

## 2016-02-07 NOTE — Telephone Encounter (Signed)
Ok to fill 

## 2016-02-07 NOTE — Telephone Encounter (Signed)
Need to confirm with pt if she still needs this medication.  Per chart, had not been taking.  If taking, how often?  Did she just restart the medication?

## 2016-02-09 ENCOUNTER — Ambulatory Visit: Payer: Medicare Other

## 2016-02-09 NOTE — Telephone Encounter (Signed)
Left message to return call 

## 2016-02-09 NOTE — Telephone Encounter (Signed)
Patient states she is taking this and has been taking this non stop

## 2016-02-10 NOTE — Telephone Encounter (Signed)
ok'd refill for lasix #30 with one refill.

## 2016-02-19 ENCOUNTER — Other Ambulatory Visit (INDEPENDENT_AMBULATORY_CARE_PROVIDER_SITE_OTHER): Payer: Self-pay | Admitting: Vascular Surgery

## 2016-02-29 ENCOUNTER — Encounter: Payer: Self-pay | Admitting: Internal Medicine

## 2016-02-29 ENCOUNTER — Ambulatory Visit (INDEPENDENT_AMBULATORY_CARE_PROVIDER_SITE_OTHER): Payer: Medicare Other | Admitting: Internal Medicine

## 2016-02-29 ENCOUNTER — Ambulatory Visit (INDEPENDENT_AMBULATORY_CARE_PROVIDER_SITE_OTHER): Payer: Medicare Other

## 2016-02-29 VITALS — BP 146/78 | HR 56 | Temp 98.1°F | Ht 62.0 in | Wt 143.4 lb

## 2016-02-29 DIAGNOSIS — R739 Hyperglycemia, unspecified: Secondary | ICD-10-CM

## 2016-02-29 DIAGNOSIS — J019 Acute sinusitis, unspecified: Secondary | ICD-10-CM

## 2016-02-29 DIAGNOSIS — I2510 Atherosclerotic heart disease of native coronary artery without angina pectoris: Secondary | ICD-10-CM | POA: Diagnosis not present

## 2016-02-29 DIAGNOSIS — M7918 Myalgia, other site: Secondary | ICD-10-CM

## 2016-02-29 DIAGNOSIS — K219 Gastro-esophageal reflux disease without esophagitis: Secondary | ICD-10-CM

## 2016-02-29 DIAGNOSIS — I10 Essential (primary) hypertension: Secondary | ICD-10-CM

## 2016-02-29 DIAGNOSIS — R1030 Lower abdominal pain, unspecified: Secondary | ICD-10-CM

## 2016-02-29 DIAGNOSIS — M4186 Other forms of scoliosis, lumbar region: Secondary | ICD-10-CM | POA: Diagnosis not present

## 2016-02-29 DIAGNOSIS — E039 Hypothyroidism, unspecified: Secondary | ICD-10-CM

## 2016-02-29 DIAGNOSIS — M791 Myalgia: Secondary | ICD-10-CM

## 2016-02-29 DIAGNOSIS — K754 Autoimmune hepatitis: Secondary | ICD-10-CM

## 2016-02-29 DIAGNOSIS — E7800 Pure hypercholesterolemia, unspecified: Secondary | ICD-10-CM

## 2016-02-29 MED ORDER — AMOXICILLIN 875 MG PO TABS: 875.0000 mg | ORAL_TABLET | Freq: Two times a day (BID) | ORAL | 0 refills | Status: DC

## 2016-02-29 NOTE — Progress Notes (Signed)
Pre visit review using our clinic review tool, if applicable. No additional management support is needed unless otherwise documented below in the visit note. 

## 2016-02-29 NOTE — Patient Instructions (Signed)
Saline nasal spray - flush nose at least 2-3x/day  nasacort nasal spray (or flonase) - two sprays each nostril one time per day.  Do this in the evening.    mucinex in the am and robitussin in the pm.  Probiotic daily while on antibiotics and for two weeks after completing antibiotics.  (examples of probiotics - align, culturelle, florastor).

## 2016-02-29 NOTE — Progress Notes (Signed)
[name], female   DOB: 27-Oct-1928, 82 y.o.   MRN: [id number]   Subjective:    [name], female    DOB: 1928/09/26, 82 y.o.   MRN: [id number]  HPI  Patient here for a scheduled follow up.  Is followed by GI for primary biliary cirrhosis.  Recommended f/u in 6 months.  Was last seen 08/2015.  Has been stable.  She reports she has been doing relatively well.  Does report that over this past week, she started having sore throat and increased drainage.  Some head pressure.  No cough.  No vomiting.  Feels like her typical sinus infections.  Eating and drinking.  No abdominal pain.  Bowels stable.     Past Medical History:  Diagnosis Date  . Autoimmune hepatitis ([place])    followed by Dr [name]  . CHF (congestive heart failure) ([place])   . Coronary artery disease    Non-ST elevation myocardial infarction in March 2016. Cardiac catheterization showed 60% ostial left circumflex hazy stenosis which was possibly the culprit. Mild LAD/RCA disease. EF 60% by echo.  . Fibrocystic breast disease   . History of colon polyps 2000  . Hypercholesterolemia   . Hypertension   . Inflammatory arthritis   . MI (myocardial infarction)   . Neuropathy ([place])   . Osteoarthritis   . 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 Thyroid disease    Past Surgical History:  Procedure Laterality Date  . ABDOMINAL HYSTERECTOMY  1963   partial, secondary to fibroids  . APPENDECTOMY    . Billroth    . CARDIAC CATHETERIZATION  05/12/14   [facility]  . CHOLECYSTECTOMY  1998  . COLONOSCOPY  2009  . UPPER GI ENDOSCOPY  2004   Family History  Problem Relation Age of Onset  . Stroke Mother   . Esophageal cancer Brother     also had lung cancer  . Breast cancer Neg Hx   . Colon cancer Neg Hx    Social History   Social History  . Marital status: Widowed    Spouse name: N/A  . Number of children: N/A  . Years of education: N/A   Social  History Main Topics  . Smoking status: Never Smoker  . Smokeless tobacco: Never Used  . Alcohol use No  . Drug use: No  . Sexual activity: Not Asked   Other Topics Concern  . None   Social History Narrative  . None    Outpatient Encounter Prescriptions as of 02/29/2016  Medication Sig  . amLODipine (NORVASC) 10 MG tablet Take 1 tablet (10 mg total) by mouth daily.  Marland Kitchen amLODipine (NORVASC) 10 MG tablet TAKE 1 TABLET(10 MG) BY MOUTH DAILY  . aspirin 81 MG tablet Take 81 mg by mouth daily.    . furosemide (LASIX) 20 MG tablet TAKE 1 TABLET(20 MG) BY MOUTH DAILY  . gabapentin (NEURONTIN) 300 MG capsule TAKE 2 CAPSULES(600 MG) BY MOUTH FOUR TIMES DAILY  . ipratropium (ATROVENT) 0.06 % nasal spray Place 1 spray into both nostrils as needed.   Marland Kitchen levothyroxine (SYNTHROID, LEVOTHROID) 112 MCG tablet TAKE 1 TABLET BY MOUTH EVERY DAY BEFORE BREAKFAST  . LUMIGAN 0.01 % SOLN Place 1 drop into the left eye at bedtime.  . metoprolol (LOPRESSOR) 50 MG tablet Take 1 tablet (50 mg total) by mouth 2 (two) times daily.  . traMADol (ULTRAM) 50 MG tablet Take 1 tablet (  50 mg total) by mouth 2 (two) times daily as needed.  . ursodiol (ACTIGALL) 300 MG capsule Take 300 mg by mouth. 2 morning - 1 night   . [DISCONTINUED] omeprazole (PRILOSEC) 20 MG capsule Take 1 capsule (20 mg total) by mouth 2 (two) times daily.  Marland Kitchen amoxicillin (AMOXIL) 875 MG tablet Take 1 tablet (875 mg total) by mouth 2 (two) times daily.   No facility-administered encounter medications on file as of 02/29/2016.     Review of Systems  Constitutional: Negative for appetite change and unexpected weight change.  HENT: Positive for congestion, postnasal drip, sinus pressure and sore throat.   Respiratory: Negative for cough, chest tightness and shortness of breath.   Cardiovascular: Negative for chest pain, palpitations and leg swelling.  Gastrointestinal: Negative for abdominal pain, diarrhea, nausea and vomiting.  Genitourinary:  Negative for difficulty urinating and dysuria.  Musculoskeletal: Negative for back pain and joint swelling.  Skin: Negative for color change and rash.  Neurological: Negative for dizziness and light-headedness.  Psychiatric/Behavioral: Negative for agitation and dysphoric mood.       Objective:    Physical Exam  Constitutional: She appears well-developed and well-nourished. No distress.  HENT:  Mouth/Throat: Oropharynx is clear and moist.  Minimal tenderness to palpation over the sinuses.  Nares - slightly erythematous turbinates.    Neck: Neck supple. No thyromegaly present.  Cardiovascular: Normal rate and regular rhythm.   Pulmonary/Chest: Breath sounds normal. No respiratory distress. She has no wheezes.  Abdominal: Soft. Bowel sounds are normal. There is no tenderness.  Musculoskeletal: She exhibits no edema or tenderness.  Lymphadenopathy:    She has no cervical adenopathy.  Skin: No rash noted. No erythema.  Psychiatric: She has a normal mood and affect. Her behavior is normal.    BP (!) 146/78   Pulse (!) 56   Temp 98.1 F (36.7 C) (Oral)   Ht 5\' 2"  (1.575 m)   Wt 143 lb 6.4 oz (65 kg)   SpO2 93%   BMI 26.23 kg/m  Wt Readings from Last 3 Encounters:  02/29/16 143 lb 6.4 oz (65 kg)  10/23/15 140 lb 4 oz (63.6 kg)  08/29/15 142 lb 12.8 oz (64.8 kg)     Lab Results  Component Value Date   WBC 8.0 11/08/2015   HGB 12.6 11/08/2015   HCT 37.5 11/08/2015   PLT 262.0 11/08/2015   GLUCOSE 87 11/08/2015   CHOL 215 (H) 11/08/2015   TRIG 92.0 11/08/2015   HDL 69.30 11/08/2015   LDLDIRECT 143.5 12/17/2012   LDLCALC 128 (H) 11/08/2015   ALT 16 11/08/2015   AST 12 11/08/2015   NA 135 11/08/2015   K 4.6 11/08/2015   CL 99 11/08/2015   CREATININE 0.97 11/08/2015   BUN 16 11/08/2015   CO2 31 11/08/2015   TSH 1.00 11/08/2015   INR 0.9 05/10/2014   HGBA1C 6.0 11/08/2015    Ct Head Wo Contrast  Result Date: 06/10/2015 CLINICAL DATA:  [place] face first onto  concrete this afternoon with laceration and bruising right eye. EXAM: CT HEAD WITHOUT CONTRAST TECHNIQUE: Contiguous axial images were obtained from the base of the skull through the vertex without intravenous contrast. COMPARISON:  04/16/2013 and 10/17/2010 FINDINGS: Ventricles, cisterns and other CSF spaces are within normal. There is no mass, mass effect, shift of midline structures or acute hemorrhage. No evidence of acute infarction. Chronic degenerative change and fragmentation of the left temporomandibular joint. Remainder of the exam is unchanged. IMPRESSION: No acute intracranial  findings. Electronically Signed   By: Marin Olp M.D.   On: 06/10/2015 18:10   Dg Knee Complete 4 Views Right  Result Date: 06/10/2015 CLINICAL DATA:  Acute right knee pain and swelling following fall. Initial encounter. EXAM: RIGHT KNEE - COMPLETE 4+ VIEW COMPARISON:  10/17/2010 and 12/30/2008 radiographs FINDINGS: Soft tissue swelling is noted. There is no evidence of definite acute fracture, subluxation or dislocation. A tiny knee effusion is present. Diffuse osteopenia and remote proximal fibular fracture again noted. IMPRESSION: Soft tissue swelling without definite acute bony abnormality. Diffuse osteopenia slightly limits sensitivity. Tiny knee effusion. Electronically Signed   By: Margarette Canada M.D.   On: 06/10/2015 18:07       Assessment & Plan:   Problem List Items Addressed This Visit    Autoimmune hepatitis ([place])    Followed by GI.  Stable.  Follow liver panel.       Relevant Orders   Hepatic function panel   Coronary artery disease    Followed by cardiology.  Cath as outlined.  Continue risk factor modification.  Unable to take statin medication as outlined.  Currently doing well.        GERD (gastroesophageal reflux disease)    Controlled on omeprazole.        Hypercholesterolemia    Avoid statin medication given liver history.  Low cholesterol diet and exercise.  Follow lipid panel.          Relevant Orders   Lipid panel   Hypertension    Blood pressure has been under good control.  A little elevated today.  Will continue same medication regimen.  Follow pressures.  Follow metabolic panel.   Notify me if persistent elevation.        Relevant Orders   Basic metabolic panel   Hypothyroidism    On thyroid replacement.  Follow tsh.        Other Visit Diagnoses    Inguinal pain, unspecified laterality    -  Primary   Relevant Orders   DG Lumbar Spine Complete (Completed)   DG Pelvis 1-2 Views (Completed)   Buttock pain       Relevant Orders   DG Lumbar Spine Complete (Completed)   DG Pelvis 1-2 Views (Completed)   Acute non-recurrent sinusitis, unspecified location       symptoms and exam as outlined.  similar to her previous infections.  treat with nasacort/saline nasal spray.  mucinex.  abx if symptoms worsen.     Relevant Medications   amoxicillin (AMOXIL) 875 MG tablet   Hyperglycemia       Relevant Orders   Hemoglobin A1c       Einar Pheasant, MD

## 2016-03-04 ENCOUNTER — Other Ambulatory Visit: Payer: Self-pay | Admitting: Internal Medicine

## 2016-03-05 ENCOUNTER — Ambulatory Visit (INDEPENDENT_AMBULATORY_CARE_PROVIDER_SITE_OTHER): Payer: Medicare Other

## 2016-03-05 DIAGNOSIS — R1030 Lower abdominal pain, unspecified: Secondary | ICD-10-CM | POA: Diagnosis not present

## 2016-03-05 DIAGNOSIS — M7918 Myalgia, other site: Secondary | ICD-10-CM

## 2016-03-05 DIAGNOSIS — M791 Myalgia: Secondary | ICD-10-CM

## 2016-03-05 DIAGNOSIS — M25559 Pain in unspecified hip: Secondary | ICD-10-CM | POA: Diagnosis not present

## 2016-03-06 ENCOUNTER — Encounter: Payer: Self-pay | Admitting: Internal Medicine

## 2016-03-06 NOTE — Assessment & Plan Note (Signed)
Followed by cardiology.  Cath as outlined.  Continue risk factor modification.  Unable to take statin medication as outlined.  Currently doing well.

## 2016-03-06 NOTE — Assessment & Plan Note (Signed)
Followed by GI.  Stable.  Follow liver panel.

## 2016-03-06 NOTE — Assessment & Plan Note (Signed)
Blood pressure has been under good control.  A little elevated today.  Will continue same medication regimen.  Follow pressures.  Follow metabolic panel.   Notify me if persistent elevation.

## 2016-03-06 NOTE — Assessment & Plan Note (Signed)
On thyroid replacement.  Follow tsh.  

## 2016-03-06 NOTE — Assessment & Plan Note (Signed)
Controlled on omeprazole.   

## 2016-03-06 NOTE — Assessment & Plan Note (Signed)
Avoid statin medication given liver history.  Low cholesterol diet and exercise.  Follow lipid panel.

## 2016-03-20 ENCOUNTER — Other Ambulatory Visit (INDEPENDENT_AMBULATORY_CARE_PROVIDER_SITE_OTHER): Payer: Medicare Other

## 2016-03-20 DIAGNOSIS — R739 Hyperglycemia, unspecified: Secondary | ICD-10-CM | POA: Diagnosis not present

## 2016-03-20 DIAGNOSIS — K754 Autoimmune hepatitis: Secondary | ICD-10-CM

## 2016-03-20 DIAGNOSIS — I10 Essential (primary) hypertension: Secondary | ICD-10-CM

## 2016-03-20 DIAGNOSIS — E7800 Pure hypercholesterolemia, unspecified: Secondary | ICD-10-CM | POA: Diagnosis not present

## 2016-03-20 LAB — BASIC METABOLIC PANEL
BUN: 24 mg/dL — ABNORMAL HIGH (ref 6–23)
CALCIUM: 9.6 mg/dL (ref 8.4–10.5)
CO2: 27 mEq/L (ref 19–32)
Chloride: 105 mEq/L (ref 96–112)
Creatinine, Ser: 0.95 mg/dL (ref 0.40–1.20)
GFR: 59.31 mL/min — AB (ref >60.00)
GLUCOSE: 96 mg/dL (ref 70–99)
Potassium: 5 mEq/L (ref 3.5–5.1)
SODIUM: 140 meq/L (ref 135–145)

## 2016-03-20 LAB — LIPID PANEL
CHOLESTEROL: 230 mg/dL — AB (ref 0–200)
HDL: 67.3 mg/dL (ref >39.00)
LDL CALC: 140 mg/dL — AB (ref 0–99)
NonHDL: 162.99
TRIGLYCERIDES: 113 mg/dL (ref 0.0–149.0)
Total CHOL/HDL Ratio: 3
VLDL: 22.6 mg/dL (ref 0.0–40.0)

## 2016-03-20 LAB — HEPATIC FUNCTION PANEL
ALBUMIN: 4.3 g/dL (ref 3.5–5.2)
ALT: 24 U/L (ref 0–35)
AST: 21 U/L (ref 0–37)
Alkaline Phosphatase: 93 U/L (ref 39–117)
Bilirubin, Direct: 0.1 mg/dL (ref 0.0–0.3)
TOTAL PROTEIN: 6.8 g/dL (ref 6.0–8.3)
Total Bilirubin: 0.5 mg/dL (ref 0.2–1.2)

## 2016-03-20 LAB — HEMOGLOBIN A1C: HEMOGLOBIN A1C: 5.9 % (ref 4.6–6.5)

## 2016-04-07 ENCOUNTER — Other Ambulatory Visit: Payer: Self-pay | Admitting: Internal Medicine

## 2016-04-08 NOTE — Telephone Encounter (Signed)
Last OV 02/29/16 last filled 04/13/15 [id number]

## 2016-04-11 ENCOUNTER — Other Ambulatory Visit: Payer: Self-pay | Admitting: Internal Medicine

## 2016-04-11 DIAGNOSIS — H02402 Unspecified ptosis of left eyelid: Secondary | ICD-10-CM | POA: Diagnosis not present

## 2016-04-18 ENCOUNTER — Ambulatory Visit (INDEPENDENT_AMBULATORY_CARE_PROVIDER_SITE_OTHER): Payer: Medicare Other

## 2016-04-18 VITALS — BP 128/68 | HR 60 | Temp 97.8°F | Resp 14 | Ht 62.0 in | Wt 144.8 lb

## 2016-04-18 DIAGNOSIS — Z0000 Encounter for general adult medical examination without abnormal findings: Secondary | ICD-10-CM | POA: Diagnosis not present

## 2016-04-18 NOTE — Progress Notes (Signed)
Subjective:   Ruth Roth is a 83 y.o. female who presents for Medicare Annual (Subsequent) preventive examination.  Review of Systems:  No ROS.  Medicare Wellness Visit.  Cardiac Risk Factors include: advanced age (>23men, >4 women);hypertension     Objective:     Vitals: BP 128/68 (BP Location: Left Arm, Patient Position: Sitting, Cuff Size: Normal)   Pulse 60   Temp 97.8 F (36.6 C) (Oral)   Resp 14   Ht 5\' 2"  (1.575 m)   Wt 144 lb 12.8 oz (65.7 kg)   SpO2 96%   BMI 26.48 kg/m   Body mass index is 26.48 kg/m.   Tobacco History  Smoking Status  . Never Smoker  Smokeless Tobacco  . Never Used     Counseling given: Not Answered   Past Medical History:  Diagnosis Date  . Autoimmune hepatitis ([place])    followed by Dr [name]  . CHF (congestive heart failure) ([place])   . Coronary artery disease    Non-ST elevation myocardial infarction in March 2016. Cardiac catheterization showed 60% ostial left circumflex hazy stenosis which was possibly the culprit. Mild LAD/RCA disease. EF 60% by echo.  . Fibrocystic breast disease   . History of colon polyps 2000  . Hypercholesterolemia   . Hypertension   . Inflammatory arthritis   . MI (myocardial infarction)   . Neuropathy ([place])   . Osteoarthritis   . 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 Thyroid disease    Past Surgical History:  Procedure Laterality Date  . ABDOMINAL HYSTERECTOMY  1963   partial, secondary to fibroids  . APPENDECTOMY    . Billroth    . CARDIAC CATHETERIZATION  05/12/14   [facility]  . CHOLECYSTECTOMY  1998  . COLONOSCOPY  2009  . UPPER GI ENDOSCOPY  2004   Family History  Problem Relation Age of Onset  . Stroke Mother   . Esophageal cancer Brother     also had lung cancer  . Cancer Daughter     colon  . Breast cancer Neg Hx   . Colon cancer Neg Hx    History  Sexual Activity  . Sexual activity: No    Outpatient Encounter  Prescriptions as of 04/18/2016  Medication Sig  . amLODipine (NORVASC) 10 MG tablet Take 1 tablet (10 mg total) by mouth daily.  Marland Kitchen aspirin 81 MG tablet Take 81 mg by mouth daily.    . brimonidine-timolol (COMBIGAN) 0.2-0.5 % ophthalmic solution   . furosemide (LASIX) 20 MG tablet TAKE 1 TABLET(20 MG) BY MOUTH DAILY  . gabapentin (NEURONTIN) 300 MG capsule TAKE 2 CAPSULES(600 MG) BY MOUTH FOUR TIMES DAILY  . ipratropium (ATROVENT) 0.06 % nasal spray Place 1 spray into both nostrils as needed.   Marland Kitchen levothyroxine (SYNTHROID, LEVOTHROID) 112 MCG tablet TAKE 1 TABLET BY MOUTH EVERY DAY BEFORE BREAKFAST  . metoprolol (LOPRESSOR) 50 MG tablet Take 1 tablet (50 mg total) by mouth 2 (two) times daily.  Marland Kitchen omeprazole (PRILOSEC) 20 MG capsule TAKE 1 CAPSULE(20 MG) BY MOUTH TWICE DAILY  . traMADol (ULTRAM) 50 MG tablet Take 1 tablet (50 mg total) by mouth 2 (two) times daily as needed.  . ursodiol (ACTIGALL) 300 MG capsule Take 300 mg by mouth. 2 morning - 1 night   . [DISCONTINUED] LUMIGAN 0.01 % SOLN Place 1 drop into the left eye at bedtime.  . [DISCONTINUED] amLODipine (NORVASC) 10 MG tablet TAKE 1 TABLET(10  MG) BY MOUTH DAILY  . [DISCONTINUED] amoxicillin (AMOXIL) 875 MG tablet Take 1 tablet (875 mg total) by mouth 2 (two) times daily.   No facility-administered encounter medications on file as of 04/18/2016.     Activities of Daily Living In your present state of health, do you have any difficulty performing the following activities: 04/18/2016 04/19/2015  Hearing? N N  Vision? N N  Difficulty concentrating or making decisions? N N  Walking or climbing stairs? N Y  Dressing or bathing? N N  Doing errands, shopping? N N  Preparing Food and eating ? N N  Using the Toilet? N N  In the past six months, have you accidently leaked urine? N N  Do you have problems with loss of bowel control? N N  Managing your Medications? N N  Managing your Finances? N N  Housekeeping or managing your Housekeeping? N N    Some recent data might be hidden    Patient Care Team: Einar Pheasant, MD as PCP - General (Unknown Physician Specialty) Robert Bellow, MD (General Surgery)    Assessment:    This is a routine wellness examination for Ruth Roth. The goal of the wellness visit is to assist the patient how to close the gaps in care and create a preventative care plan for the patient.   Osteoporosis risk reviewed.  Medications reviewed; taking without issues or barriers.  Safety issues reviewed; smoke detectors in the home. No firearms in the home. Wears seatbelts when driving or riding with others. No violence in the home.  No identified risk were noted; The patient was oriented x 3; appropriate in dress and manner and no objective failures at ADL's or IADL's.   BMI; discussed the importance of a healthy diet, water intake and exercise. Educational material provided.  HTN; followed by PCP.  PNA 23 vaccine discussed; educational material provided.  Health maintenance gaps; closed.  Patient Concerns: None at this time. Follow up with PCP as needed.  Exercise Activities and Dietary recommendations Current Exercise Habits: Structured exercise class, Type of exercise: treadmill, Time (Minutes): 60, Frequency (Times/Week): 2, Weekly Exercise (Minutes/Week): 120, Intensity: Moderate  Goals    . Healthy Lifestyle          Stay hydrated.  Drink plenty of fluids/water Low carb foods Stay active and continue exercise regimen      Fall Risk Fall Risk  04/18/2016 10/23/2015 04/19/2015 09/30/2014 09/09/2013  Falls in the past year? Yes Yes No No No  Number falls in past yr: 1 2 or more - - -  Injury with Fall? Yes Yes - - -  Follow up Falls prevention discussed - - - -   Depression Screen PHQ 2/9 Scores 04/18/2016 10/23/2015 04/19/2015 09/30/2014  PHQ - 2 Score 0 0 0 0  PHQ- 9 Score - - - -     Cognitive Function MMSE - Mini Mental State Exam 04/19/2015  Orientation to time 5  Orientation to Place  5  Registration 3  Attention/ Calculation 5  Recall 3  Language- name 2 objects 2  Language- repeat 1  Language- follow 3 step command 3  Language- read & follow direction 1  Write a sentence 1  Copy design 1  Total score 30     6CIT Screen 04/18/2016  What Year? 0 points  What month? 0 points  What time? 0 points  Count back from 20 0 points  Months in reverse 0 points  Repeat phrase 0 points  Total  Score 0    Immunization History  Administered Date(s) Administered  . Influenza, High Dose Seasonal PF 01/23/2016  . Influenza,inj,Quad PF,36+ Mos 12/24/2012, 01/11/2014, 01/16/2015  . Pneumococcal Conjugate-13 02/22/2014  . Td 08/17/2010   Screening Tests Health Maintenance  Topic Date Due  . PNA vac Low Risk Adult (2 of 2 - PPSV23) 02/23/2015  . ZOSTAVAX  04/18/2016 (Originally 05/29/1990)  . MAMMOGRAM  06/08/2016  . TETANUS/TDAP  08/16/2020  . INFLUENZA VACCINE  Completed  . DEXA SCAN  Completed      Plan:    End of life planning; Advance aging; Advanced directives discussed. Copy of current HCPOA/Living Will requested.  Medicare Attestation I have personally reviewed: The patient's medical and social history Their use of alcohol, tobacco or illicit drugs Their current medications and supplements The patient's functional ability including ADLs,fall risks, home safety risks, cognitive, and hearing and visual impairment Diet and physical activities Evidence for depression   The patient's weight, height, BMI, and visual acuity have been recorded in the chart.  I have made referrals and provided education to the patient based on review of the above and I have provided the patient with a written personalized care plan for preventive services.    During the course of the visit the patient was educated and counseled about the following appropriate screening and preventive services:   Vaccines to include Pneumoccal, Influenza, Hepatitis B, Td, Zostavax,  HCV  Electrocardiogram  Cardiovascular Disease  Colorectal cancer screening  Bone density screening  Diabetes screening  Glaucoma screening  Mammography/PAP  Nutrition counseling   Patient Instructions (the written plan) was given to the patient.   Varney Biles, LPN  09/15/2421   Reviewed above information.  Agree with plan.  Dr Nicki Reaper

## 2016-04-18 NOTE — Patient Instructions (Addendum)
  Ms. Valko , Thank you for taking time to come for your Medicare Wellness Visit. I appreciate your ongoing commitment to your health goals. Please review the following plan we discussed and let me know if I can assist you in the future.   Follow up with Dr. Nicki Reaper as needed.  These are the goals we discussed: Goals    . Healthy Lifestyle          Stay hydrated.  Drink plenty of fluids/water Low carb foods Stay active and continue exercise regimen       This is a list of the screening recommended for you and due dates:  Health Maintenance  Topic Date Due  . Pneumonia vaccines (2 of 2 - PPSV23) 02/23/2015  . Shingles Vaccine  04/18/2016*  . Mammogram  06/08/2016  . Tetanus Vaccine  08/16/2020  . Flu Shot  Completed  . DEXA scan (bone density measurement)  Completed  *Topic was postponed. The date shown is not the original due date.

## 2016-05-08 ENCOUNTER — Other Ambulatory Visit: Payer: Self-pay | Admitting: Gastroenterology

## 2016-05-08 DIAGNOSIS — K754 Autoimmune hepatitis: Secondary | ICD-10-CM

## 2016-05-08 DIAGNOSIS — K743 Primary biliary cirrhosis: Secondary | ICD-10-CM | POA: Diagnosis not present

## 2016-05-09 ENCOUNTER — Other Ambulatory Visit: Payer: Self-pay | Admitting: Internal Medicine

## 2016-05-15 ENCOUNTER — Ambulatory Visit: Payer: Medicare Other

## 2016-05-16 ENCOUNTER — Other Ambulatory Visit: Payer: Self-pay | Admitting: Internal Medicine

## 2016-05-16 ENCOUNTER — Encounter: Payer: Self-pay | Admitting: Internal Medicine

## 2016-05-16 ENCOUNTER — Ambulatory Visit (INDEPENDENT_AMBULATORY_CARE_PROVIDER_SITE_OTHER): Payer: Medicare Other | Admitting: Internal Medicine

## 2016-05-16 VITALS — BP 138/72 | HR 59 | Temp 98.7°F | Resp 16 | Ht 62.0 in | Wt 144.0 lb

## 2016-05-16 DIAGNOSIS — R739 Hyperglycemia, unspecified: Secondary | ICD-10-CM | POA: Diagnosis not present

## 2016-05-16 DIAGNOSIS — E039 Hypothyroidism, unspecified: Secondary | ICD-10-CM | POA: Diagnosis not present

## 2016-05-16 DIAGNOSIS — Z1239 Encounter for other screening for malignant neoplasm of breast: Secondary | ICD-10-CM

## 2016-05-16 DIAGNOSIS — K754 Autoimmune hepatitis: Secondary | ICD-10-CM

## 2016-05-16 DIAGNOSIS — K219 Gastro-esophageal reflux disease without esophagitis: Secondary | ICD-10-CM | POA: Diagnosis not present

## 2016-05-16 DIAGNOSIS — I2510 Atherosclerotic heart disease of native coronary artery without angina pectoris: Secondary | ICD-10-CM

## 2016-05-16 DIAGNOSIS — E063 Autoimmune thyroiditis: Secondary | ICD-10-CM | POA: Diagnosis not present

## 2016-05-16 DIAGNOSIS — E7800 Pure hypercholesterolemia, unspecified: Secondary | ICD-10-CM

## 2016-05-16 DIAGNOSIS — I10 Essential (primary) hypertension: Secondary | ICD-10-CM

## 2016-05-16 DIAGNOSIS — I5032 Chronic diastolic (congestive) heart failure: Secondary | ICD-10-CM | POA: Diagnosis not present

## 2016-05-16 DIAGNOSIS — M545 Low back pain, unspecified: Secondary | ICD-10-CM

## 2016-05-16 DIAGNOSIS — Z1231 Encounter for screening mammogram for malignant neoplasm of breast: Secondary | ICD-10-CM | POA: Diagnosis not present

## 2016-05-16 MED ORDER — LEVOTHYROXINE SODIUM 112 MCG PO TABS: ORAL_TABLET | ORAL | 11 refills | Status: DC

## 2016-05-16 MED ORDER — METOPROLOL TARTRATE 50 MG PO TABS: 50.0000 mg | ORAL_TABLET | Freq: Two times a day (BID) | ORAL | 3 refills | Status: DC

## 2016-05-16 MED ORDER — GABAPENTIN 300 MG PO CAPS: ORAL_CAPSULE | ORAL | 0 refills | Status: DC

## 2016-05-16 MED ORDER — OMEPRAZOLE 20 MG PO CPDR: DELAYED_RELEASE_CAPSULE | ORAL | 3 refills | Status: DC

## 2016-05-16 MED ORDER — AMLODIPINE BESYLATE 10 MG PO TABS: 10.0000 mg | ORAL_TABLET | Freq: Every day | ORAL | 3 refills | Status: DC

## 2016-05-16 MED ORDER — TRAMADOL HCL 50 MG PO TABS: 50.0000 mg | ORAL_TABLET | Freq: Two times a day (BID) | ORAL | 0 refills | Status: DC | PRN

## 2016-05-16 NOTE — Progress Notes (Signed)
[name], female   DOB: 10-16-1928, 83 y.o.   MRN: [id number]   Subjective:    [name], female    DOB: 1928-05-14, 83 y.o.   MRN: [id number]  HPI  Patient here for a scheduled follow up.  Increased stress with her daughter's health issues.  Overall she feels she is handling things relatively well.  She is having persistent increased back pain.  Taking tramadol. Before bed and occasionally during the day.  Xray 02/29/16 revealed L2 compression fracture unchanged compared to 2/206, moderate scoliotic curvature and degenerative changes.  Discussed referral given persistent back pain.  No chest pain.  No sob.  No acid reflux.  No abdominal pain.  Bowels moving.     Past Medical History:  Diagnosis Date  . Autoimmune hepatitis ([place])    followed by Dr [name]  . CHF (congestive heart failure) ([place])   . Coronary artery disease    Non-ST elevation myocardial infarction in March 2016. Cardiac catheterization showed 60% ostial left circumflex hazy stenosis which was possibly the culprit. Mild LAD/RCA disease. EF 60% by echo.  . Fibrocystic breast disease   . History of colon polyps 2000  . Hypercholesterolemia   . Hypertension   . Inflammatory arthritis   . MI (myocardial infarction)   . Neuropathy ([place])   . Osteoarthritis   . 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 Thyroid disease    Past Surgical History:  Procedure Laterality Date  . ABDOMINAL HYSTERECTOMY  1963   partial, secondary to fibroids  . APPENDECTOMY    . Billroth    . CARDIAC CATHETERIZATION  05/12/14   [facility]  . CHOLECYSTECTOMY  1998  . COLONOSCOPY  2009  . UPPER GI ENDOSCOPY  2004   Family History  Problem Relation Age of Onset  . Stroke Mother   . Esophageal cancer Brother     also had lung cancer  . Cancer Daughter     colon  . Breast cancer Neg Hx   . Colon cancer Neg Hx    Social History   Social History  . Marital  status: Widowed    Spouse name: N/A  . Number of children: N/A  . Years of education: N/A   Social History Main Topics  . Smoking status: Never Smoker  . Smokeless tobacco: Never Used  . Alcohol use No  . Drug use: No  . Sexual activity: No   Other Topics Concern  . None   Social History Narrative  . None     Review of Systems  Constitutional: Negative for appetite change and unexpected weight change.  HENT: Negative for congestion and sinus pressure.   Respiratory: Negative for cough, chest tightness and shortness of breath.   Cardiovascular: Negative for chest pain, palpitations and leg swelling.  Gastrointestinal: Negative for abdominal pain, diarrhea, nausea and vomiting.  Genitourinary: Negative for difficulty urinating and dysuria.  Musculoskeletal: Positive for back pain. Negative for joint swelling.  Skin: Negative for color change and rash.  Neurological: Negative for dizziness, light-headedness and headaches.  Psychiatric/Behavioral: Negative for agitation and dysphoric mood.       Objective:     Blood pressure rechecked by me:  138/72  Physical Exam  Constitutional: She appears well-developed and well-nourished. No distress.  HENT:  Nose: Nose normal.  Mouth/Throat: Oropharynx is clear and moist.  Neck: Neck supple. No thyromegaly present.  Cardiovascular: Normal rate and  regular rhythm.   Pulmonary/Chest: Breath sounds normal. No respiratory distress. She has no wheezes.  Abdominal: Soft. Bowel sounds are normal. There is no tenderness.  Musculoskeletal: She exhibits no edema or tenderness.  Lymphadenopathy:    She has no cervical adenopathy.  Skin: No rash noted. No erythema.  Psychiatric: She has a normal mood and affect. Her behavior is normal.    BP 138/72   Pulse (!) 59   Temp 98.7 F (37.1 C) (Oral)   Resp 16   Ht 5\' 2"  (1.575 m)   Wt 144 lb (65.3 kg)   SpO2 95%   BMI 26.34 kg/m  Wt Readings from Last 3 Encounters:  05/16/16 144 lb  (65.3 kg)  04/18/16 144 lb 12.8 oz (65.7 kg)  02/29/16 143 lb 6.4 oz (65 kg)     Lab Results  Component Value Date   WBC 8.0 11/08/2015   HGB 12.6 11/08/2015   HCT 37.5 11/08/2015   PLT 262.0 11/08/2015   GLUCOSE 96 03/20/2016   CHOL 230 (H) 03/20/2016   TRIG 113.0 03/20/2016   HDL 67.30 03/20/2016   LDLDIRECT 143.5 12/17/2012   LDLCALC 140 (H) 03/20/2016   ALT 24 03/20/2016   AST 21 03/20/2016   NA 140 03/20/2016   K 5.0 03/20/2016   CL 105 03/20/2016   CREATININE 0.95 03/20/2016   BUN 24 (H) 03/20/2016   CO2 27 03/20/2016   TSH 1.00 11/08/2015   INR 0.9 05/10/2014   HGBA1C 5.9 03/20/2016    Ct Head Wo Contrast  Result Date: 06/10/2015 CLINICAL DATA:  [place] face first onto concrete this afternoon with laceration and bruising right eye. EXAM: CT HEAD WITHOUT CONTRAST TECHNIQUE: Contiguous axial images were obtained from the base of the skull through the vertex without intravenous contrast. COMPARISON:  04/16/2013 and 10/17/2010 FINDINGS: Ventricles, cisterns and other CSF spaces are within normal. There is no mass, mass effect, shift of midline structures or acute hemorrhage. No evidence of acute infarction. Chronic degenerative change and fragmentation of the left temporomandibular joint. Remainder of the exam is unchanged. IMPRESSION: No acute intracranial findings. Electronically Signed   By: Marin Olp M.D.   On: 06/10/2015 18:10   Dg Knee Complete 4 Views Right  Result Date: 06/10/2015 CLINICAL DATA:  Acute right knee pain and swelling following fall. Initial encounter. EXAM: RIGHT KNEE - COMPLETE 4+ VIEW COMPARISON:  10/17/2010 and 12/30/2008 radiographs FINDINGS: Soft tissue swelling is noted. There is no evidence of definite acute fracture, subluxation or dislocation. A tiny knee effusion is present. Diffuse osteopenia and remote proximal fibular fracture again noted. IMPRESSION: Soft tissue swelling without definite acute bony abnormality. Diffuse osteopenia slightly  limits sensitivity. Tiny knee effusion. Electronically Signed   By: Margarette Canada M.D.   On: 06/10/2015 18:07       Assessment & Plan:   Problem List Items Addressed This Visit    Autoimmune hepatitis ([place])    Followed by GI.  Stable.  Follow liver panel.        Back pain    Takes tramadol.  On gabapentin.  Xray as outlined.  Persistent pain.  Refer to Dr Sharlet Salina for evaluation and further treatment.        Relevant Medications   traMADol (ULTRAM) 50 MG tablet   Chronic diastolic heart failure ([facility])    Stable.  Followed by cardiology.  Continue current medication regimen.        Relevant Medications   amLODipine (NORVASC) 10 MG tablet  metoprolol (LOPRESSOR) 50 MG tablet   Coronary artery disease    Followed by cardiology.  Continue risk factor modification.        Relevant Medications   amLODipine (NORVASC) 10 MG tablet   metoprolol (LOPRESSOR) 50 MG tablet   GERD (gastroesophageal reflux disease)    Controlled on omeprazole.        Relevant Medications   omeprazole (PRILOSEC) 20 MG capsule   Hypercholesterolemia    Low cholesterol diet and exercise.  Follow lipid panel.        Relevant Medications   amLODipine (NORVASC) 10 MG tablet   metoprolol (LOPRESSOR) 50 MG tablet   Other Relevant Orders   Hepatic function panel   Lipid panel   Hypertension    Blood pressure on recheck ok.  Continue current medication regimen.  Follow pressures.  Follow metabolic panel.        Relevant Medications   amLODipine (NORVASC) 10 MG tablet   metoprolol (LOPRESSOR) 50 MG tablet   Other Relevant Orders   Basic metabolic panel   Hypothyroidism    On thyroid replacement.  Follow tsh.       Relevant Medications   levothyroxine (SYNTHROID, LEVOTHROID) 112 MCG tablet   metoprolol (LOPRESSOR) 50 MG tablet   Thyroiditis, lymphocytic    Followed by Dr Harlow Asa.  Has f/u soon.       Relevant Medications   levothyroxine (SYNTHROID, LEVOTHROID) 112 MCG tablet   metoprolol  (LOPRESSOR) 50 MG tablet    Other Visit Diagnoses    Screening for breast cancer    -  Primary   Relevant Orders   MM DIGITAL SCREENING BILATERAL   Hyperglycemia       Relevant Orders   Hemoglobin A1c       Einar Pheasant, MD

## 2016-05-16 NOTE — Progress Notes (Signed)
Pre-visit discussion using our clinic review tool. No additional management support is needed unless otherwise documented below in the visit note.  

## 2016-05-16 NOTE — Telephone Encounter (Signed)
Refilled on 10/23/2015 Last Ov: 02/29/2016 Next Ov: not one scheduled.   Please advise.

## 2016-05-19 ENCOUNTER — Encounter: Payer: Self-pay | Admitting: Internal Medicine

## 2016-05-19 DIAGNOSIS — M549 Dorsalgia, unspecified: Secondary | ICD-10-CM | POA: Insufficient documentation

## 2016-05-19 NOTE — Assessment & Plan Note (Signed)
Takes tramadol.  On gabapentin.  Xray as outlined.  Persistent pain.  Refer to Dr Sharlet Salina for evaluation and further treatment.

## 2016-05-19 NOTE — Assessment & Plan Note (Signed)
Blood pressure on recheck ok.  Continue current medication regimen.  Follow pressures.  Follow metabolic panel.

## 2016-05-19 NOTE — Assessment & Plan Note (Signed)
Controlled on omeprazole.   

## 2016-05-19 NOTE — Assessment & Plan Note (Signed)
On thyroid replacement.  Follow tsh.  

## 2016-05-19 NOTE — Assessment & Plan Note (Signed)
Stable.  Followed by cardiology.  Continue current medication regimen.

## 2016-05-19 NOTE — Assessment & Plan Note (Signed)
Followed by Dr Harlow Asa.  Has f/u soon.

## 2016-05-19 NOTE — Assessment & Plan Note (Signed)
Followed by GI.  Stable.  Follow liver panel.

## 2016-05-19 NOTE — Assessment & Plan Note (Signed)
Followed by cardiology.  Continue risk factor modification.   

## 2016-05-19 NOTE — Assessment & Plan Note (Signed)
Low cholesterol diet and exercise.  Follow lipid panel.   

## 2016-05-21 ENCOUNTER — Ambulatory Visit
Admission: RE | Admit: 2016-05-21 | Discharge: 2016-05-21 | Disposition: A | Discharge status: Home / Self Care | Payer: Medicare Other | Source: Ambulatory Visit | Attending: Gastroenterology | Admitting: Gastroenterology

## 2016-05-27 ENCOUNTER — Ambulatory Visit
Admission: RE | Admit: 2016-05-27 | Discharge: 2016-05-27 | Disposition: A | Discharge status: Home / Self Care | Payer: Medicare Other | Source: Ambulatory Visit | Attending: Gastroenterology | Admitting: Gastroenterology

## 2016-05-27 DIAGNOSIS — B199 Unspecified viral hepatitis without hepatic coma: Secondary | ICD-10-CM | POA: Diagnosis not present

## 2016-05-27 DIAGNOSIS — K743 Primary biliary cirrhosis: Secondary | ICD-10-CM | POA: Insufficient documentation

## 2016-05-27 DIAGNOSIS — Z9049 Acquired absence of other specified parts of digestive tract: Secondary | ICD-10-CM | POA: Diagnosis not present

## 2016-05-27 DIAGNOSIS — K754 Autoimmune hepatitis: Secondary | ICD-10-CM | POA: Insufficient documentation

## 2016-05-28 ENCOUNTER — Telehealth: Payer: Self-pay | Admitting: Internal Medicine

## 2016-05-28 NOTE — Telephone Encounter (Signed)
Pt dropped off Disability parking form to be filled out.. Placed in Dr. Lars Mage folder upfront. Please advise

## 2016-05-28 NOTE — Telephone Encounter (Signed)
In your blue folder to sign

## 2016-06-04 DIAGNOSIS — E039 Hypothyroidism, unspecified: Secondary | ICD-10-CM | POA: Diagnosis not present

## 2016-06-17 ENCOUNTER — Encounter: Payer: Self-pay | Admitting: Radiology

## 2016-06-17 ENCOUNTER — Ambulatory Visit
Admission: RE | Admit: 2016-06-17 | Discharge: 2016-06-17 | Disposition: A | Discharge status: Home / Self Care | Payer: Medicare Other | Source: Ambulatory Visit | Attending: Internal Medicine | Admitting: Internal Medicine

## 2016-06-17 DIAGNOSIS — Z1231 Encounter for screening mammogram for malignant neoplasm of breast: Secondary | ICD-10-CM | POA: Diagnosis present

## 2016-06-17 DIAGNOSIS — Z1239 Encounter for other screening for malignant neoplasm of breast: Secondary | ICD-10-CM

## 2016-06-18 DIAGNOSIS — H401131 Primary open-angle glaucoma, bilateral, mild stage: Secondary | ICD-10-CM | POA: Diagnosis not present

## 2016-07-07 ENCOUNTER — Other Ambulatory Visit: Payer: Self-pay | Admitting: Internal Medicine

## 2016-07-11 DIAGNOSIS — H02402 Unspecified ptosis of left eyelid: Secondary | ICD-10-CM | POA: Diagnosis not present

## 2016-07-26 ENCOUNTER — Other Ambulatory Visit: Payer: Self-pay | Admitting: Physical Medicine and Rehabilitation

## 2016-07-26 DIAGNOSIS — M5136 Other intervertebral disc degeneration, lumbar region: Secondary | ICD-10-CM | POA: Diagnosis not present

## 2016-07-26 DIAGNOSIS — M48062 Spinal stenosis, lumbar region with neurogenic claudication: Secondary | ICD-10-CM | POA: Diagnosis not present

## 2016-07-26 DIAGNOSIS — M5416 Radiculopathy, lumbar region: Secondary | ICD-10-CM

## 2016-07-29 ENCOUNTER — Other Ambulatory Visit (INDEPENDENT_AMBULATORY_CARE_PROVIDER_SITE_OTHER): Payer: Medicare Other

## 2016-07-29 DIAGNOSIS — E7800 Pure hypercholesterolemia, unspecified: Secondary | ICD-10-CM | POA: Diagnosis not present

## 2016-07-29 DIAGNOSIS — I10 Essential (primary) hypertension: Secondary | ICD-10-CM

## 2016-07-29 DIAGNOSIS — R739 Hyperglycemia, unspecified: Secondary | ICD-10-CM

## 2016-07-29 LAB — HEPATIC FUNCTION PANEL
ALT: 13 U/L (ref 0–35)
AST: 11 U/L (ref 0–37)
Albumin: 4.2 g/dL (ref 3.5–5.2)
Alkaline Phosphatase: 90 U/L (ref 39–117)
Bilirubin, Direct: 0.2 mg/dL (ref 0.0–0.3)
TOTAL PROTEIN: 6.7 g/dL (ref 6.0–8.3)
Total Bilirubin: 0.3 mg/dL (ref 0.2–1.2)

## 2016-07-29 LAB — BASIC METABOLIC PANEL
BUN: 27 mg/dL — ABNORMAL HIGH (ref 6–23)
CHLORIDE: 109 meq/L (ref 96–112)
CO2: 24 mEq/L (ref 19–32)
Calcium: 9 mg/dL (ref 8.4–10.5)
Creatinine, Ser: 1.2 mg/dL (ref 0.40–1.20)
GFR: 45.25 mL/min — AB (ref >60.00)
GLUCOSE: 90 mg/dL (ref 70–99)
POTASSIUM: 4.1 meq/L (ref 3.5–5.1)
SODIUM: 139 meq/L (ref 135–145)

## 2016-07-29 LAB — LIPID PANEL
CHOLESTEROL: 215 mg/dL — AB (ref 0–200)
HDL: 54.2 mg/dL (ref >39.00)
LDL Cholesterol: 125 mg/dL — ABNORMAL HIGH (ref 0–99)
NonHDL: 160.62
Total CHOL/HDL Ratio: 4
Triglycerides: 178 mg/dL — ABNORMAL HIGH (ref 0.0–149.0)
VLDL: 35.6 mg/dL (ref 0.0–40.0)

## 2016-07-29 LAB — HEMOGLOBIN A1C: HEMOGLOBIN A1C: 6 % (ref 4.6–6.5)

## 2016-07-30 ENCOUNTER — Encounter: Payer: Self-pay | Admitting: Internal Medicine

## 2016-07-30 ENCOUNTER — Ambulatory Visit (INDEPENDENT_AMBULATORY_CARE_PROVIDER_SITE_OTHER): Payer: Medicare Other | Admitting: Internal Medicine

## 2016-07-30 VITALS — BP 118/62 | HR 63 | Temp 98.7°F | Resp 12 | Ht 63.0 in | Wt 145.0 lb

## 2016-07-30 DIAGNOSIS — E039 Hypothyroidism, unspecified: Secondary | ICD-10-CM

## 2016-07-30 DIAGNOSIS — R2681 Unsteadiness on feet: Secondary | ICD-10-CM

## 2016-07-30 DIAGNOSIS — R413 Other amnesia: Secondary | ICD-10-CM

## 2016-07-30 DIAGNOSIS — E063 Autoimmune thyroiditis: Secondary | ICD-10-CM | POA: Diagnosis not present

## 2016-07-30 DIAGNOSIS — K754 Autoimmune hepatitis: Secondary | ICD-10-CM

## 2016-07-30 DIAGNOSIS — I10 Essential (primary) hypertension: Secondary | ICD-10-CM

## 2016-07-30 DIAGNOSIS — Z23 Encounter for immunization: Secondary | ICD-10-CM | POA: Diagnosis not present

## 2016-07-30 DIAGNOSIS — I2510 Atherosclerotic heart disease of native coronary artery without angina pectoris: Secondary | ICD-10-CM

## 2016-07-30 DIAGNOSIS — N289 Disorder of kidney and ureter, unspecified: Secondary | ICD-10-CM

## 2016-07-30 DIAGNOSIS — M545 Low back pain, unspecified: Secondary | ICD-10-CM

## 2016-07-30 DIAGNOSIS — I5032 Chronic diastolic (congestive) heart failure: Secondary | ICD-10-CM | POA: Diagnosis not present

## 2016-07-30 DIAGNOSIS — K219 Gastro-esophageal reflux disease without esophagitis: Secondary | ICD-10-CM | POA: Diagnosis not present

## 2016-07-30 DIAGNOSIS — E7800 Pure hypercholesterolemia, unspecified: Secondary | ICD-10-CM

## 2016-07-30 MED ORDER — PANTOPRAZOLE SODIUM 40 MG PO TBEC: 40.0000 mg | DELAYED_RELEASE_TABLET | Freq: Every day | ORAL | 1 refills | Status: DC

## 2016-07-30 MED ORDER — MAGNESIUM OXIDE 400 MG PO TABS: 400.0000 mg | ORAL_TABLET | Freq: Every day | ORAL | 1 refills | Status: DC

## 2016-07-30 NOTE — Progress Notes (Signed)
[name], female   DOB: 1928-08-06, 83 y.o.   MRN: [id number]   Subjective:    [name], female    DOB: 02-03-1929, 83 y.o.   MRN: [id number]  HPI  Patient here for a scheduled follow up.  She reports that she is doing relatively well.  Stays active.  No chest pain.  No sob.  Change to protonix.  Has been taking bid.  Will start q day.  No abdominal pain.  Bowels moving.  Just saw Dr Harlow Asa 05/2016.  Stable.  Will check thyroid with next labs.  Having pain in her back.  Seeing Dr Sharlet Salina.  Planning for MRI 08/08/16.  Some balance issues.  Feels unsteady.  No dizziness.  No light headedness.  Just unsteady gait.  Does report some short term memory change.  Nothing significant.  Has noticed some minimal change.     Past Medical History:  Diagnosis Date  . Autoimmune hepatitis ([place])    followed by Dr [name]  . CHF (congestive heart failure) ([place])   . Coronary artery disease    Non-ST elevation myocardial infarction in March 2016. Cardiac catheterization showed 60% ostial left circumflex hazy stenosis which was possibly the culprit. Mild LAD/RCA disease. EF 60% by echo.  . Fibrocystic breast disease   . History of colon polyps 2000  . Hypercholesterolemia   . Hypertension   . Inflammatory arthritis   . MI (myocardial infarction) ([employer])   . Neuropathy   . Osteoarthritis   . 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 Thyroid disease    Past Surgical History:  Procedure Laterality Date  . ABDOMINAL HYSTERECTOMY  1963   partial, secondary to fibroids  . APPENDECTOMY    . Billroth    . CARDIAC CATHETERIZATION  05/12/14   [facility]  . CHOLECYSTECTOMY  1998  . COLONOSCOPY  2009  . UPPER GI ENDOSCOPY  2004   Family History  Problem Relation Age of Onset  . Stroke Mother   . Esophageal cancer Brother        also had lung cancer  . Cancer Daughter        colon  . Breast cancer Neg Hx   . Colon cancer Neg Hx      Social History   Social History  . Marital status: Widowed    Spouse name: N/A  . Number of children: N/A  . Years of education: N/A   Social History Main Topics  . Smoking status: Never Smoker  . Smokeless tobacco: Never Used  . Alcohol use No  . Drug use: No  . Sexual activity: No   Other Topics Concern  . None   Social History Narrative  . None    Outpatient Encounter Prescriptions as of 07/30/2016  Medication Sig  . amLODipine (NORVASC) 10 MG tablet Take 1 tablet (10 mg total) by mouth daily.  Marland Kitchen aspirin 81 MG tablet Take 81 mg by mouth daily.    . brimonidine-timolol (COMBIGAN) 0.2-0.5 % ophthalmic solution 1 drop 2 (two) times daily.   . furosemide (LASIX) 20 MG tablet TAKE 1 TABLET(20 MG) BY MOUTH DAILY  . gabapentin (NEURONTIN) 300 MG capsule TAKE 2 CAPSULES(600 MG) BY MOUTH FOUR TIMES DAILY  . ipratropium (ATROVENT) 0.06 % nasal spray Place 1 spray into both nostrils as needed.   Marland Kitchen levothyroxine (SYNTHROID, LEVOTHROID) 112 MCG tablet TAKE 1 TABLET BY MOUTH EVERY DAY BEFORE BREAKFAST  .  metoprolol (LOPRESSOR) 50 MG tablet Take 1 tablet (50 mg total) by mouth 2 (two) times daily.  . traMADol (ULTRAM) 50 MG tablet Take 1 tablet (50 mg total) by mouth 2 (two) times daily as needed.  . ursodiol (ACTIGALL) 300 MG capsule Take 300 mg by mouth. 2 morning - 1 night   . [DISCONTINUED] gabapentin (NEURONTIN) 300 MG capsule TAKE 2 CAPSULES(600 MG) BY MOUTH FOUR TIMES DAILY  . [DISCONTINUED] omeprazole (PRILOSEC) 20 MG capsule TAKE 1 CAPSULE(20 MG) BY MOUTH TWICE DAILY  . magnesium oxide (MAG-OX) 400 MG tablet Take 1 tablet (400 mg total) by mouth daily.  . pantoprazole (PROTONIX) 40 MG tablet Take 1 tablet (40 mg total) by mouth daily.   No facility-administered encounter medications on file as of 07/30/2016.     Review of Systems  Constitutional: Negative for appetite change and unexpected weight change.  HENT: Negative for congestion and sinus pressure.   Respiratory:  Negative for cough, chest tightness and shortness of breath.   Cardiovascular: Negative for chest pain, palpitations and leg swelling.  Gastrointestinal: Negative for abdominal pain, diarrhea, nausea and vomiting.  Genitourinary: Negative for difficulty urinating and dysuria.  Musculoskeletal: Positive for back pain. Negative for joint swelling.  Skin: Negative for color change and rash.  Neurological: Negative for dizziness, light-headedness and headaches.  Psychiatric/Behavioral: Negative for agitation and dysphoric mood.       Objective:    Physical Exam  Constitutional: She appears well-developed and well-nourished. No distress.  HENT:  Nose: Nose normal.  Mouth/Throat: Oropharynx is clear and moist.  Neck: Neck supple. No thyromegaly present.  Cardiovascular: Normal rate and regular rhythm.   Pulmonary/Chest: Breath sounds normal. No respiratory distress. She has no wheezes.  Abdominal: Soft. Bowel sounds are normal. There is no tenderness.  Musculoskeletal: She exhibits no edema or tenderness.  Lymphadenopathy:    She has no cervical adenopathy.  Skin: No rash noted. No erythema.  Psychiatric: Her behavior is normal.    BP 118/62 (BP Location: Left Arm, Patient Position: Sitting, Cuff Size: Normal)   Pulse 63   Temp 98.7 F (37.1 C) (Oral)   Resp 12   Ht 5\' 3"  (1.6 m)   Wt 145 lb (65.8 kg)   SpO2 97%   BMI 25.69 kg/m  Wt Readings from Last 3 Encounters:  07/30/16 145 lb (65.8 kg)  05/16/16 144 lb (65.3 kg)  04/18/16 144 lb 12.8 oz (65.7 kg)     Lab Results  Component Value Date   WBC 8.0 11/08/2015   HGB 12.6 11/08/2015   HCT 37.5 11/08/2015   PLT 262.0 11/08/2015   GLUCOSE 90 07/29/2016   CHOL 215 (H) 07/29/2016   TRIG 178.0 (H) 07/29/2016   HDL 54.20 07/29/2016   LDLDIRECT 143.5 12/17/2012   LDLCALC 125 (H) 07/29/2016   ALT 13 07/29/2016   AST 11 07/29/2016   NA 139 07/29/2016   K 4.1 07/29/2016   CL 109 07/29/2016   CREATININE 1.20 07/29/2016    BUN 27 (H) 07/29/2016   CO2 24 07/29/2016   TSH 1.00 11/08/2015   INR 0.9 05/10/2014   HGBA1C 6.0 07/29/2016    Mm Digital Screening Bilateral  Result Date: 06/17/2016 CLINICAL DATA:  Screening. EXAM: DIGITAL SCREENING BILATERAL MAMMOGRAM WITH CAD COMPARISON:  Previous exam(s). ACR Breast Density Category c: The breast tissue is heterogeneously dense, which may obscure small masses. FINDINGS: There are no findings suspicious for malignancy. Images were processed with CAD. IMPRESSION: No mammographic evidence of malignancy. A  result letter of this screening mammogram will be mailed directly to the patient. RECOMMENDATION: Screening mammogram in one year. (Code:SM-B-01Y) BI-RADS CATEGORY  1: Negative. Electronically Signed   By: Dorise Bullion III M.D   On: 06/17/2016 15:55       Assessment & Plan:   Problem List Items Addressed This Visit    Autoimmune hepatitis ([place])    Followed by GI.  Stable.  Follow liver panel.        Back pain    Seeing Dr Sharlet Salina.  Planning for MRI.        Chronic diastolic heart failure ([facility])    Stable.  Followed by cardiology.        Coronary artery disease    Followed by cardiology.  Stable.  Continue risk factor modification.        GERD (gastroesophageal reflux disease)    Change to protonix daily.  Follow.        Relevant Medications   pantoprazole (PROTONIX) 40 MG tablet   magnesium oxide (MAG-OX) 400 MG tablet   Hypercholesterolemia    Low cholesterol diet and exercise.  Follow lipid panel.         Hypertension - Primary    Blood pressure under good control.  Continue same medication regimen.  Follow pressures.  Follow metabolic panel.        Hypothyroidism    On thyroid replacement.  Follow tsh.        Renal insufficiency   Relevant Orders   Basic metabolic panel   Thyroiditis, lymphocytic    Followed by Dr Harlow Asa.  Just evaluated 05/2016.  Stable.  Check tsh today.         Other Visit Diagnoses    Memory change       reports  some minimal change.  did well on mini mental status.  check tsh and b12.  wants to hold on any further evaluation.     Relevant Orders   TSH   Vitamin B12   Need for pneumococcal vaccination       Relevant Orders   Pneumococcal polysaccharide vaccine 23-valent greater than or equal to 2yo subcutaneous/IM (Completed)   Unsteady gait       Relevant Orders   Ambulatory referral to Physical Therapy       Einar Pheasant, MD

## 2016-07-30 NOTE — Progress Notes (Signed)
Pre-visit discussion using our clinic review tool. No additional management support is needed unless otherwise documented below in the visit note.  

## 2016-08-03 ENCOUNTER — Encounter: Payer: Self-pay | Admitting: Internal Medicine

## 2016-08-03 NOTE — Assessment & Plan Note (Signed)
Followed by cardiology.  Stable.  Continue risk factor modification.   

## 2016-08-03 NOTE — Assessment & Plan Note (Signed)
Low cholesterol diet and exercise.  Follow lipid panel.   

## 2016-08-03 NOTE — Assessment & Plan Note (Signed)
Change to protonix daily.  Follow.

## 2016-08-03 NOTE — Assessment & Plan Note (Signed)
Seeing Dr Sharlet Salina.  Planning for MRI.

## 2016-08-03 NOTE — Assessment & Plan Note (Signed)
Followed by Dr Harlow Asa.  Just evaluated 05/2016.  Stable.  Check tsh today.

## 2016-08-03 NOTE — Assessment & Plan Note (Signed)
Followed by GI.  Stable.  Follow liver panel.

## 2016-08-03 NOTE — Assessment & Plan Note (Signed)
Blood pressure under good control.  Continue same medication regimen.  Follow pressures.  Follow metabolic panel.   

## 2016-08-03 NOTE — Assessment & Plan Note (Signed)
Stable.  Followed by cardiology.   

## 2016-08-03 NOTE — Assessment & Plan Note (Signed)
On thyroid replacement.  Follow tsh.  

## 2016-08-06 DIAGNOSIS — H401131 Primary open-angle glaucoma, bilateral, mild stage: Secondary | ICD-10-CM | POA: Diagnosis not present

## 2016-08-08 ENCOUNTER — Emergency Department
Admission: EM | Admit: 2016-08-08 | Discharge: 2016-08-08 | Disposition: A | Discharge status: Home / Self Care | Payer: Medicare Other | Attending: Emergency Medicine | Admitting: Emergency Medicine

## 2016-08-08 ENCOUNTER — Emergency Department: Payer: Medicare Other

## 2016-08-08 ENCOUNTER — Encounter: Payer: Self-pay | Admitting: Emergency Medicine

## 2016-08-08 ENCOUNTER — Telehealth: Payer: Self-pay | Admitting: Internal Medicine

## 2016-08-08 ENCOUNTER — Ambulatory Visit: Payer: Medicare Other

## 2016-08-08 ENCOUNTER — Telehealth: Payer: Self-pay | Admitting: *Deleted

## 2016-08-08 ENCOUNTER — Other Ambulatory Visit: Payer: Medicare Other

## 2016-08-08 ENCOUNTER — Ambulatory Visit: Payer: Medicare Other | Admitting: Adult Health

## 2016-08-08 DIAGNOSIS — I5032 Chronic diastolic (congestive) heart failure: Secondary | ICD-10-CM | POA: Insufficient documentation

## 2016-08-08 DIAGNOSIS — I2510 Atherosclerotic heart disease of native coronary artery without angina pectoris: Secondary | ICD-10-CM | POA: Insufficient documentation

## 2016-08-08 DIAGNOSIS — I110 Hypertensive heart disease with heart failure: Secondary | ICD-10-CM | POA: Diagnosis not present

## 2016-08-08 DIAGNOSIS — E039 Hypothyroidism, unspecified: Secondary | ICD-10-CM | POA: Diagnosis not present

## 2016-08-08 DIAGNOSIS — Z79899 Other long term (current) drug therapy: Secondary | ICD-10-CM | POA: Diagnosis not present

## 2016-08-08 DIAGNOSIS — Z7982 Long term (current) use of aspirin: Secondary | ICD-10-CM | POA: Insufficient documentation

## 2016-08-08 DIAGNOSIS — R42 Dizziness and giddiness: Secondary | ICD-10-CM | POA: Diagnosis present

## 2016-08-08 LAB — BASIC METABOLIC PANEL
Anion gap: 8 (ref 5–15)
BUN: 18 mg/dL (ref 6–20)
CHLORIDE: 105 mmol/L (ref 101–111)
CO2: 25 mmol/L (ref 22–32)
Calcium: 9.5 mg/dL (ref 8.9–10.3)
Creatinine, Ser: 0.88 mg/dL (ref 0.44–1.00)
GFR calc Af Amer: >60 mL/min (ref >60)
GFR calc non Af Amer: 58 mL/min — ABNORMAL LOW (ref >60)
Glucose, Bld: 126 mg/dL — ABNORMAL HIGH (ref 65–99)
POTASSIUM: 4 mmol/L (ref 3.5–5.1)
Sodium: 138 mmol/L (ref 135–145)

## 2016-08-08 LAB — CBC
HEMATOCRIT: 37 % (ref 35.0–47.0)
Hemoglobin: 12.6 g/dL (ref 12.0–16.0)
MCH: 31.8 pg (ref 26.0–34.0)
MCHC: 34.1 g/dL (ref 32.0–36.0)
MCV: 93.3 fL (ref 80.0–100.0)
Platelets: 211 10*3/uL (ref 150–440)
RBC: 3.96 MIL/uL (ref 3.80–5.20)
RDW: 13.5 % (ref 11.5–14.5)
WBC: 6.8 10*3/uL (ref 3.6–11.0)

## 2016-08-08 LAB — URINALYSIS, COMPLETE (UACMP) WITH MICROSCOPIC
Bilirubin Urine: NEGATIVE
Glucose, UA: NEGATIVE mg/dL
Hgb urine dipstick: NEGATIVE
KETONES UR: NEGATIVE mg/dL
Leukocytes, UA: NEGATIVE
Nitrite: NEGATIVE
PH: 6 (ref 5.0–8.0)
PROTEIN: NEGATIVE mg/dL
Specific Gravity, Urine: 1.009 (ref 1.005–1.030)

## 2016-08-08 LAB — TROPONIN I

## 2016-08-08 MED ORDER — ACETAMINOPHEN 325 MG PO TABS
650.0000 mg | ORAL_TABLET | Freq: Once | ORAL | Status: DC
  Filled 2016-08-08: qty 2

## 2016-08-08 NOTE — ED Notes (Signed)
Pt back from mri

## 2016-08-08 NOTE — ED Notes (Addendum)
Pt had requested for something for her headache that she believes is due to not eating. Pt refused the tylenol offered. Requesting ultram. Dr aware and wants to wait.

## 2016-08-08 NOTE — ED Triage Notes (Signed)
Pt comes into the ED via POv c/o dizziness that started this morning when she woke up.  Patient explains that it feels as though the room is spinning.  Patient denies chest pain or shortness of breath.  Explains she was nauseas earlier today when it first started.  Patient neurologically intact at this time and in NAD.  Last known normal was last night at 22:00 before bed.

## 2016-08-08 NOTE — Telephone Encounter (Signed)
Agree with need for evaluation, especially if unable to walk without assistance, etc.

## 2016-08-08 NOTE — ED Notes (Signed)
FN: pt brought over from [facility] with reports of dizziness and the room spinning since this am.

## 2016-08-08 NOTE — Telephone Encounter (Signed)
Patient Name: Ruth Roth  DOB: October 01, 1928    Initial Comment caller states her mother is having extreme dizziness an nausea . can barely walk because she is so dizzy .    Nurse Assessment  Nurse: Mallie Mussel, RN, Alveta Heimlich Date/Time Eilene Ghazi Time): 08/08/2016 10:09:07 AM  Confirm and document reason for call. If symptomatic, describe symptoms. ---Caller states that her mother has dizziness with room spinning beginning this morning. She can hardly walk, she has to hold onto something to walk which is new and different for her. She vomited a "little bit a while ago, but not a lot." Denies head injury.  Does the patient have any new or worsening symptoms? ---Yes  Will a triage be completed? ---Yes  Related visit to physician within the last 2 weeks? ---No  Does the PT have any chronic conditions? (i.e. diabetes, asthma, etc.) ---Yes  List chronic conditions. ---HTN, Liver Problems,  Is this a behavioral health or substance abuse call? ---No     Guidelines    Guideline Title Affirmed Question Affirmed Notes  Dizziness - Vertigo SEVERE dizziness (vertigo) (e.g., unable to walk without assistance)    Final Disposition User   Go to ED Now (or PCP triage) Mallie Mussel, RN, Alveta Heimlich    Comments  No appointments at [facility] or [place]. I was attempted to schedule her at [facility]. There is an opening with Dorothyann Peng NP for 2:30 -3:00pm. I could not get it to accept the 30 minute appointment. I called the backline and spoke with Arbie Cookey who was able to enter the appointment.   Referrals  REFERRED TO PCP OFFICE   Disagree/Comply: Comply

## 2016-08-08 NOTE — ED Provider Notes (Signed)
Clinical Course as of Aug 09 2219  Thu Aug 08, 2016  2056 Assuming care from Dr. Alfred Levins.  In short, Ruth Roth is a 83 y.o. female with a chief complaint of dizziness.  Refer to the original H&P for additional details.  The current plan of care is to follow up MRI and disposition appropriately..   [CF]  2216 Reassuring MRI with no evidence of acute CVA.  The patient is feeling well and ate a meal while she was awaiting the results.  She is comfortable with the plan to go home.  I will discharge as per the recommendations by Dr. Alfred Levins. MR Brain Wo Contrast [CF]    Clinical Course User Index [CF] Hinda Kehr, MD      Hinda Kehr, MD 08/08/16 2221

## 2016-08-08 NOTE — ED Provider Notes (Signed)
[facility] Emergency Department Provider Note  ____________________________________________  Time seen: Approximately 8:43 PM  I have reviewed the triage vital signs and the nursing notes.   HISTORY  Chief Complaint Dizziness   HPI Ruth Roth is a 83 y.o. female with a history of CHF, CAD, hypertension who presents for evaluation of dizziness. Patient reports that she woke up at 4 AM this morning feeling dizzy. She describes her dizziness as feeling drunk. She has been unable to walk steady today due to her symptoms. She was also complaining of nausea and multiple episodes of vomiting. She has a diffuse, moderate HA which has gotten progressively worse throughout the day, non radiating. No chest pain or shortness of breath, no facial droop, no slurred speech, no unilateral weakness or numbness. No personal or family history of stroke. Patient is nonsmoker.No tinnitus, no changes in vision, no dysarthria or dysphagia.   Past Medical History:  Diagnosis Date  . Autoimmune hepatitis ([place])    followed by Dr [name]  . CHF (congestive heart failure) ([place])   . Coronary artery disease    Non-ST elevation myocardial infarction in March 2016. Cardiac catheterization showed 60% ostial left circumflex hazy stenosis which was possibly the culprit. Mild LAD/RCA disease. EF 60% by echo.  . Fibrocystic breast disease   . History of colon polyps 2000  . Hypercholesterolemia   . Hypertension   . Inflammatory arthritis   . MI (myocardial infarction) ([place])   . Neuropathy   . Osteoarthritis   . 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 Thyroid disease     Patient Active Problem List   Diagnosis Date Noted  . Back pain 05/19/2016  . Acute URI 04/05/2015  . Abnormal CXR 10/02/2014  . Chronic diastolic heart failure ([place]) 05/24/2014  . Coronary artery disease   . Dyspnea 05/09/2014  . Renal insufficiency  05/06/2014  . Palpitations 04/17/2014  . Health care maintenance 04/17/2014  . Unspecified gastritis and gastroduodenitis without mention of hemorrhage 11/04/2012  . GERD (gastroesophageal reflux disease) 10/18/2012  . Autoimmune hepatitis ([place]) 01/20/2012  . Osteopenia 01/20/2012  . Hypertension 01/16/2012  . Hypercholesterolemia 01/16/2012  . Hypothyroidism 04/30/2011  . Thyroiditis, lymphocytic 04/30/2011    Past Surgical History:  Procedure Laterality Date  . ABDOMINAL HYSTERECTOMY  1963   partial, secondary to fibroids  . APPENDECTOMY    . Billroth    . CARDIAC CATHETERIZATION  05/12/14   [facility]  . CHOLECYSTECTOMY  1998  . COLONOSCOPY  2009  . UPPER GI ENDOSCOPY  2004    Prior to Admission medications   Medication Sig Start Date End Date Taking? Authorizing Provider  amLODipine (NORVASC) 10 MG tablet Take 1 tablet (10 mg total) by mouth daily. 05/16/16   Einar Pheasant, MD  aspirin 81 MG tablet Take 81 mg by mouth daily.      [provider]  brimonidine-timolol (COMBIGAN) 0.2-0.5 % ophthalmic solution 1 drop 2 (two) times daily.  04/03/16   [provider]  furosemide (LASIX) 20 MG tablet TAKE 1 TABLET(20 MG) BY MOUTH DAILY 05/09/16   Einar Pheasant, MD  gabapentin (NEURONTIN) 300 MG capsule TAKE 2 CAPSULES(600 MG) BY MOUTH FOUR TIMES DAILY 05/16/16   Einar Pheasant, MD  ipratropium (ATROVENT) 0.06 % nasal spray Place 1 spray into both nostrils as needed.  04/24/14   [provider]  levothyroxine (SYNTHROID, LEVOTHROID) 112 MCG tablet TAKE 1 TABLET BY MOUTH EVERY  DAY BEFORE BREAKFAST 05/16/16   Einar Pheasant, MD  magnesium oxide (MAG-OX) 400 MG tablet Take 1 tablet (400 mg total) by mouth daily. 07/30/16   Einar Pheasant, MD  metoprolol (LOPRESSOR) 50 MG tablet Take 1 tablet (50 mg total) by mouth 2 (two) times daily. 05/16/16   Einar Pheasant, MD  pantoprazole (PROTONIX) 40 MG tablet Take 1 tablet (40 mg total) by mouth daily. 07/30/16   Einar Pheasant,  MD  traMADol (ULTRAM) 50 MG tablet Take 1 tablet (50 mg total) by mouth 2 (two) times daily as needed. 05/16/16 05/16/17  Einar Pheasant, MD  ursodiol (ACTIGALL) 300 MG capsule Take 300 mg by mouth. 2 morning - 1 night     [provider]    Allergies Altace [ramipril]; Haldol [haloperidol lactate]; Librax [chlordiazepoxide-clidinium]; Statins; and Plavix [clopidogrel bisulfate]  Family History  Problem Relation Age of Onset  . Stroke Mother   . Esophageal cancer Brother        also had lung cancer  . Cancer Daughter        colon  . Breast cancer Neg Hx   . Colon cancer Neg Hx     Social History Social History  Substance Use Topics  . Smoking status: Never Smoker  . Smokeless tobacco: Never Used  . Alcohol use No    Review of Systems  Constitutional: Negative for fever. Eyes: Negative for visual changes. ENT: Negative for sore throat. Neck: No neck pain  Cardiovascular: Negative for chest pain. Respiratory: Negative for shortness of breath. Gastrointestinal: Negative for abdominal pain, vomiting or diarrhea. Genitourinary: Negative for dysuria. Musculoskeletal: Negative for back pain. Skin: Negative for rash. Neurological: Negative for headaches, weakness or numbness. + dizziness Psych: No SI or HI  ____________________________________________   PHYSICAL EXAM:  VITAL SIGNS: ED Triage Vitals  Enc Vitals Group     BP 08/08/16 1900 (!) 147/63     Pulse Rate 08/08/16 1900 63     Resp 08/08/16 1900 12     Temp --      Temp src --      SpO2 08/08/16 1900 96 %     Weight 08/08/16 1635 145 lb (65.8 kg)     Height 08/08/16 1635 5\' 3"  (1.6 m)     Head Circumference --      Peak Flow --      Pain Score --      Pain Loc --      Pain Edu? --      Excl. in [place]? --     Constitutional: Alert and oriented. Well appearing and in no apparent distress. HEENT:      Head: Normocephalic and atraumatic.         Eyes: Conjunctivae are normal. Sclera is non-icteric.        Mouth/Throat: Mucous membranes are moist.       Neck: Supple with no signs of meningismus. No carotid bruits Cardiovascular: Regular rate and rhythm. No murmurs, gallops, or rubs. 2+ symmetrical distal pulses are present in all extremities. No JVD. Respiratory: Normal respiratory effort. Lungs are clear to auscultation bilaterally. No wheezes, crackles, or rhonchi.  Gastrointestinal: Soft, non tender, and non distended with positive bowel sounds. No rebound or guarding. Musculoskeletal: Nontender with normal range of motion in all extremities. No edema, cyanosis, or erythema of extremities. Neurologic: Normal speech and language. A & O x3, PERRL, no nystagmus, CN II-XII intact, motor testing reveals good tone and bulk throughout. There is no evidence of pronator  drift or dysmetria. Muscle strength is 5/5 throughout. Deep tendon reflexes are 2+ throughout with downgoing toes. Sensory examination is intact. Gait is normal. Negative Dix-Halpike Skin: Skin is warm, dry and intact. No rash noted. Psychiatric: Mood and affect are normal. Speech and behavior are normal.  ____________________________________________   LABS (all labs ordered are listed, but only abnormal results are displayed)  Labs Reviewed  BASIC METABOLIC PANEL - Abnormal; Notable for the following:       Result Value   Glucose, Bld 126 (*)    GFR calc non Af Amer 58 (*)    All other components within normal limits  URINALYSIS, COMPLETE (UACMP) WITH MICROSCOPIC - Abnormal; Notable for the following:    Color, Urine YELLOW (*)    APPearance CLEAR (*)    Bacteria, UA RARE (*)    Squamous Epithelial / LPF 0-5 (*)    All other components within normal limits  CBC  TROPONIN I   ____________________________________________  EKG  ED ECG REPORT I, Rudene Re, the attending physician, personally viewed and interpreted this ECG.  Normal sinus rhythm, rate of 63, normal intervals, normal axis, no ST elevations or  depressions.  ____________________________________________  RADIOLOGY  Head CT: negative  MRI brain: PND  ____________________________________________   PROCEDURES  Procedure(s) performed: None Procedures Critical Care performed:  None ____________________________________________   INITIAL IMPRESSION / ASSESSMENT AND PLAN / ED COURSE  83 y.o. female with a history of CHF, CAD, hypertension who presents for evaluation of dizziness. The patient describes her symptoms as feeling drunk and unable to walk today. No localized neurological findings on history per patient and daughter who was with her the whole day. Patient is neurologically intact at this time and ambulated without difficulty. Negative Dicks Hallpike. Head CT with no acute findings. This patient is a vasculopath I ordered an MRI to rule out posterior fossa stroke. MRI is pending at this time and care is transferred to Dr. Karma Greaser.   Pertinent labs & imaging results that were available during my care of the patient were reviewed by me and considered in my medical decision making (see chart for details).    ____________________________________________   FINAL CLINICAL IMPRESSION(S) / ED DIAGNOSES  Final diagnoses:  Dizziness      NEW MEDICATIONS STARTED DURING THIS VISIT:  Discharge Medication List as of 08/08/2016 10:20 PM       Note:  This document was prepared using Dragon voice recognition software and may include unintentional dictation errors.    Alfred Levins, [place], MD 08/09/16 [phone]

## 2016-08-08 NOTE — Telephone Encounter (Signed)
Patient is currently having extreme dizziness, and upset stomach . Patient is having trouble walking due to dizziness.  Pt contact [phone] *transferred to [facility]

## 2016-08-08 NOTE — Telephone Encounter (Signed)
Reason for call: dizziness Symptoms: dizziness, nausea , upset stomach, no fever , unable to walk without assistance , BP 122/55 this am, BP 137/95 pulse 63, no chest pain, very weak, taking in fluids  Duration today  Medications: took one of daughters ondansetron 8 mg this am  Last seen for this problem: Seen by: Levander Campion daughter Advised to go to [place] [facility] walk in clinic due to no appointments declined to go to [employer]  , She aggreement to go get evaluated at [facility]

## 2016-08-08 NOTE — ED Notes (Signed)
Pt taken to car in w/c

## 2016-08-09 ENCOUNTER — Ambulatory Visit (INDEPENDENT_AMBULATORY_CARE_PROVIDER_SITE_OTHER): Payer: Medicare Other | Admitting: Internal Medicine

## 2016-08-09 ENCOUNTER — Encounter: Payer: Self-pay | Admitting: Internal Medicine

## 2016-08-09 DIAGNOSIS — R42 Dizziness and giddiness: Secondary | ICD-10-CM | POA: Insufficient documentation

## 2016-08-09 DIAGNOSIS — I10 Essential (primary) hypertension: Secondary | ICD-10-CM

## 2016-08-09 DIAGNOSIS — I5032 Chronic diastolic (congestive) heart failure: Secondary | ICD-10-CM | POA: Diagnosis not present

## 2016-08-09 DIAGNOSIS — I2510 Atherosclerotic heart disease of native coronary artery without angina pectoris: Secondary | ICD-10-CM | POA: Diagnosis not present

## 2016-08-09 MED ORDER — MAGNESIUM OXIDE 400 MG PO TABS: 400.0000 mg | ORAL_TABLET | Freq: Every day | ORAL | 1 refills | Status: DC

## 2016-08-09 MED ORDER — PANTOPRAZOLE SODIUM 40 MG PO TBEC: 40.0000 mg | DELAYED_RELEASE_TABLET | Freq: Every day | ORAL | 1 refills | Status: DC

## 2016-08-09 NOTE — Progress Notes (Signed)
[name], female   DOB: Aug 21, 1928, 83 y.o.   MRN: [id number]   Subjective:    [name], female    DOB: [birth year], 83 y.o.   MRN: [id number]  HPI  Patient here for ER follow up.  She is accompanied by her daughter.  History obtained from both of them.  She was seen 08/08/16 for evaluation of acute dizziness.  States she woke at 4:00 am and raised up.  When she sat up, the room was spinning.  She went to the bathroom.  Dizziness persisted.  Emesis x 1.  Some nausea associated with the dizziness.  Went to acute care.  Went to get out of the car and as soon as she stepped out, she felt worse.  Dizziness worsened.  Was sent to ER.  Had headache.  No chest pain.  No sob.  Had negative head CT.  MRI - no acute CVA.  She started feeling better.  Discharged home with instructions per pt - to f/u with ENT.  On questioning her today, she states she feels better.  No significant dizziness like she felt yesterday.  Is eating.  No headache.  Had some dizziness this am.  Better now.  No chest pain.  No sob.  No further nausea or vomiting.  No diarrhea.     Past Medical History:  Diagnosis Date  . Autoimmune hepatitis ([place])    followed by Dr [name]  . CHF (congestive heart failure) ([place])   . Coronary artery disease    Non-ST elevation myocardial infarction in March 2016. Cardiac catheterization showed 60% ostial left circumflex hazy stenosis which was possibly the culprit. Mild LAD/RCA disease. EF 60% by echo.  . Fibrocystic breast disease   . History of colon polyps 2000  . Hypercholesterolemia   . Hypertension   . Inflammatory arthritis   . MI (myocardial infarction) ([place])   . Neuropathy   . Osteoarthritis   . 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 Thyroid disease    Past Surgical History:  Procedure Laterality Date  . ABDOMINAL HYSTERECTOMY  1963   partial, secondary to fibroids  . APPENDECTOMY    . Billroth     . CARDIAC CATHETERIZATION  05/12/14   [facility]  . CHOLECYSTECTOMY  1998  . COLONOSCOPY  2009  . UPPER GI ENDOSCOPY  2004   Family History  Problem Relation Age of Onset  . Stroke Mother   . Esophageal cancer Brother        also had lung cancer  . Cancer Daughter        colon  . Breast cancer Neg Hx   . Colon cancer Neg Hx    Social History   Social History  . Marital status: Widowed    Spouse name: N/A  . Number of children: N/A  . Years of education: N/A   Social History Main Topics  . Smoking status: Never Smoker  . Smokeless tobacco: Never Used  . Alcohol use No  . Drug use: No  . Sexual activity: No   Other Topics Concern  . None   Social History Narrative  . None    Outpatient Encounter Prescriptions as of 08/09/2016  Medication Sig  . amLODipine (NORVASC) 10 MG tablet Take 1 tablet (10 mg total) by mouth daily.  Marland Kitchen aspirin 81 MG tablet Take 81 mg by mouth daily.    . brimonidine-timolol (COMBIGAN) 0.2-0.5 %  ophthalmic solution 1 drop 2 (two) times daily.   . furosemide (LASIX) 20 MG tablet TAKE 1 TABLET(20 MG) BY MOUTH DAILY  . gabapentin (NEURONTIN) 300 MG capsule TAKE 2 CAPSULES(600 MG) BY MOUTH FOUR TIMES DAILY  . ipratropium (ATROVENT) 0.06 % nasal spray Place 1 spray into both nostrils as needed.   Marland Kitchen levothyroxine (SYNTHROID, LEVOTHROID) 112 MCG tablet TAKE 1 TABLET BY MOUTH EVERY DAY BEFORE BREAKFAST  . magnesium oxide (MAG-OX) 400 MG tablet Take 1 tablet (400 mg total) by mouth daily.  . metoprolol (LOPRESSOR) 50 MG tablet Take 1 tablet (50 mg total) by mouth 2 (two) times daily.  . pantoprazole (PROTONIX) 40 MG tablet Take 1 tablet (40 mg total) by mouth daily.  . traMADol (ULTRAM) 50 MG tablet Take 1 tablet (50 mg total) by mouth 2 (two) times daily as needed.  . ursodiol (ACTIGALL) 300 MG capsule Take 300 mg by mouth. 2 morning - 1 night   . [DISCONTINUED] magnesium oxide (MAG-OX) 400 MG tablet Take 1 tablet (400 mg total) by mouth daily.  .  [DISCONTINUED] pantoprazole (PROTONIX) 40 MG tablet Take 1 tablet (40 mg total) by mouth daily.   No facility-administered encounter medications on file as of 08/09/2016.     Review of Systems  Constitutional: Negative for appetite change and unexpected weight change.  HENT: Negative for congestion and sinus pressure.   Respiratory: Negative for cough, chest tightness and shortness of breath.   Cardiovascular: Negative for chest pain, palpitations and leg swelling.  Gastrointestinal: Negative for abdominal pain, diarrhea, nausea and vomiting.  Genitourinary: Negative for difficulty urinating and dysuria.  Musculoskeletal: Negative for back pain and joint swelling.  Skin: Negative for color change and rash.  Neurological: Positive for dizziness and headaches. Negative for syncope.  Psychiatric/Behavioral: Negative for agitation and dysphoric mood.       Objective:    Physical Exam  Constitutional: She appears well-developed and well-nourished. No distress.  HENT:  Nose: Nose normal.  Mouth/Throat: Oropharynx is clear and moist.  Neck: Neck supple. No thyromegaly present.  Cardiovascular: Normal rate and regular rhythm.   Pulmonary/Chest: Breath sounds normal. No respiratory distress. She has no wheezes.  Abdominal: Soft. Bowel sounds are normal. There is no tenderness.  Musculoskeletal: She exhibits no edema or tenderness.  Lymphadenopathy:    She has no cervical adenopathy.  Skin: No rash noted. No erythema.  Psychiatric: She has a normal mood and affect. Her behavior is normal.    BP 110/62 (BP Location: Left Arm, Patient Position: Sitting, Cuff Size: Normal)   Pulse 68   Temp 98.6 F (37 C) (Oral)   Resp 12   Wt 142 lb 9.6 oz (64.7 kg)   SpO2 94%   BMI 25.26 kg/m  Wt Readings from Last 3 Encounters:  08/09/16 142 lb 9.6 oz (64.7 kg)  08/08/16 145 lb (65.8 kg)  07/30/16 145 lb (65.8 kg)     Lab Results  Component Value Date   WBC 6.8 08/08/2016   HGB 12.6  08/08/2016   HCT 37.0 08/08/2016   PLT 211 08/08/2016   GLUCOSE 126 (H) 08/08/2016   CHOL 215 (H) 07/29/2016   TRIG 178.0 (H) 07/29/2016   HDL 54.20 07/29/2016   LDLDIRECT 143.5 12/17/2012   LDLCALC 125 (H) 07/29/2016   ALT 13 07/29/2016   AST 11 07/29/2016   NA 138 08/08/2016   K 4.0 08/08/2016   CL 105 08/08/2016   CREATININE 0.88 08/08/2016   BUN 18 08/08/2016  CO2 25 08/08/2016   TSH 1.00 11/08/2015   INR 0.9 05/10/2014   HGBA1C 6.0 07/29/2016    Ct Head Wo Contrast  Result Date: 08/08/2016 CLINICAL DATA:  Dizziness and vertigo EXAM: CT HEAD WITHOUT CONTRAST TECHNIQUE: Contiguous axial images were obtained from the base of the skull through the vertex without intravenous contrast. COMPARISON:  06/10/2015 FINDINGS: Brain: There is no evidence for acute hemorrhage, hydrocephalus, mass lesion, or abnormal extra-axial fluid collection. No definite CT evidence for acute infarction. Diffuse loss of parenchymal volume is consistent with atrophy. Vascular: No hyperdense vessel or unexpected calcification. Skull: No evidence for fracture. No worrisome lytic or sclerotic lesion. Sinuses/Orbits: The visualized paranasal sinuses and mastoid air cells are clear. Visualized portions of the globes and intraorbital fat are unremarkable. Other: None. IMPRESSION: Stable.  No acute findings. Electronically Signed   By: Misty Stanley M.D.   On: 08/08/2016 19:39   Mr Brain Wo Contrast  Result Date: 08/08/2016 CLINICAL DATA:  Initial evaluation for acute onset vertigo. EXAM: MRI HEAD WITHOUT CONTRAST TECHNIQUE: Multiplanar, multiecho pulse sequences of the brain and surrounding structures were obtained without intravenous contrast. COMPARISON:  Prior CT from earlier same day. FINDINGS: Brain: Generalized age-related cerebral atrophy with mild chronic small vessel ischemic disease. No evidence for acute or subacute infarct. Gray-white matter differentiation maintained. No encephalomalacia to suggest  chronic infarction. No evidence for acute or chronic intracranial hemorrhage. No mass lesion, midline shift or mass effect. Ventricles normal size without evidence for hydrocephalus. No extra-axial fluid collection. Major dural sinuses are grossly patent. Incidental note made of a partially empty sella. Midline structures intact and normal. Vascular: Major intracranial vascular flow voids are maintained. Skull and upper cervical spine: Craniocervical junction within normal limits. Mild degenerative thickening of the tectorial membrane without significant stenosis. Visualized upper cervical spine otherwise unremarkable. Bone marrow signal intensity within normal limits. No scalp soft tissue abnormality. Sinuses/Orbits: Globes and oval soft tissues within normal limits. Patient status post lens extraction bilaterally. Paranasal sinuses are clear. Trace bilateral mastoid effusions. Inner ear structures normal. Other: None. IMPRESSION: 1. No acute intracranial process identified. 2. Mild age-related cerebral atrophy with chronic small vessel ischemic disease. Electronically Signed   By: Jeannine Boga M.D.   On: 08/08/2016 21:44       Assessment & Plan:   Problem List Items Addressed This Visit    Chronic diastolic heart failure ([place])    Followed by cardiology.  Stable.        Coronary artery disease    Followed by cardiology.  Stable.  Continue risk factor modification.  Had EKG in ER.        Dizziness    Acute dizziness as outlined.  Not reproducible on exam today.   No chest pain.  No sob.  No syncope or near syncope.  Had EKG in ER.  CT negative.  MRI brain - no acute CVA.  Continue aspirin daily.  Given presenting symptoms and severity, discussed further w/up.  Will have ENT evaluate.  Obtain carotid ultrasound.       Relevant Orders   Ambulatory referral to ENT   VAS US CAROTID   Hypertension    Blood pressure under good control.  Continue same medication regimen.  Follow pressures.   Follow metabolic panel.            Einar Pheasant, MD

## 2016-08-11 ENCOUNTER — Encounter: Payer: Self-pay | Admitting: Internal Medicine

## 2016-08-11 NOTE — Assessment & Plan Note (Addendum)
Acute dizziness as outlined.  Not reproducible on exam today.   No chest pain.  No sob.  No syncope or near syncope.  Had EKG in ER.  CT negative.  MRI brain - no acute CVA.  Continue aspirin daily.  Given presenting symptoms and severity, discussed further w/up.  Will have ENT evaluate.  Obtain carotid ultrasound.

## 2016-08-11 NOTE — Assessment & Plan Note (Signed)
Blood pressure under good control.  Continue same medication regimen.  Follow pressures.  Follow metabolic panel.   

## 2016-08-11 NOTE — Assessment & Plan Note (Signed)
Followed by cardiology. Stable.   

## 2016-08-11 NOTE — Assessment & Plan Note (Signed)
Followed by cardiology.  Stable.  Continue risk factor modification.  Had EKG in ER.

## 2016-08-14 ENCOUNTER — Other Ambulatory Visit (INDEPENDENT_AMBULATORY_CARE_PROVIDER_SITE_OTHER): Payer: Medicare Other

## 2016-08-14 DIAGNOSIS — N289 Disorder of kidney and ureter, unspecified: Secondary | ICD-10-CM | POA: Diagnosis not present

## 2016-08-14 DIAGNOSIS — R413 Other amnesia: Secondary | ICD-10-CM | POA: Diagnosis not present

## 2016-08-14 LAB — BASIC METABOLIC PANEL
BUN: 19 mg/dL (ref 6–23)
CALCIUM: 9.3 mg/dL (ref 8.4–10.5)
CO2: 25 mEq/L (ref 19–32)
CREATININE: 1.07 mg/dL (ref 0.40–1.20)
Chloride: 106 mEq/L (ref 96–112)
GFR: 51.65 mL/min — ABNORMAL LOW (ref >60.00)
GLUCOSE: 63 mg/dL — AB (ref 70–99)
Potassium: 4.1 mEq/L (ref 3.5–5.1)
Sodium: 137 mEq/L (ref 135–145)

## 2016-08-14 LAB — VITAMIN B12: Vitamin B-12: 649 pg/mL (ref 211–911)

## 2016-08-14 LAB — TSH: TSH: 1.96 u[IU]/mL (ref 0.35–4.50)

## 2016-08-15 ENCOUNTER — Telehealth: Payer: Self-pay | Admitting: Internal Medicine

## 2016-08-15 NOTE — Telephone Encounter (Signed)
See lab note.  

## 2016-08-15 NOTE — Telephone Encounter (Signed)
Pt called back returning your call. Thank you!  Call pt @ [phone]

## 2016-08-16 ENCOUNTER — Ambulatory Visit (HOSPITAL_COMMUNITY): Payer: Medicare Other

## 2016-08-19 ENCOUNTER — Ambulatory Visit
Admission: RE | Admit: 2016-08-19 | Discharge: 2016-08-19 | Disposition: A | Discharge status: Home / Self Care | Payer: Medicare Other | Source: Ambulatory Visit | Attending: Physical Medicine and Rehabilitation | Admitting: Physical Medicine and Rehabilitation

## 2016-08-19 DIAGNOSIS — M5416 Radiculopathy, lumbar region: Secondary | ICD-10-CM | POA: Diagnosis not present

## 2016-08-19 DIAGNOSIS — M47896 Other spondylosis, lumbar region: Secondary | ICD-10-CM | POA: Insufficient documentation

## 2016-08-19 DIAGNOSIS — M48061 Spinal stenosis, lumbar region without neurogenic claudication: Secondary | ICD-10-CM | POA: Diagnosis not present

## 2016-08-19 DIAGNOSIS — M1288 Other specific arthropathies, not elsewhere classified, other specified site: Secondary | ICD-10-CM | POA: Insufficient documentation

## 2016-08-19 DIAGNOSIS — M545 Low back pain: Secondary | ICD-10-CM | POA: Diagnosis not present

## 2016-08-20 ENCOUNTER — Ambulatory Visit (INDEPENDENT_AMBULATORY_CARE_PROVIDER_SITE_OTHER): Payer: Medicare Other | Admitting: Cardiovascular Disease

## 2016-08-20 ENCOUNTER — Encounter: Payer: Self-pay | Admitting: Cardiovascular Disease

## 2016-08-20 VITALS — BP 130/60 | HR 57 | Ht 63.0 in | Wt 141.8 lb

## 2016-08-20 DIAGNOSIS — I2510 Atherosclerotic heart disease of native coronary artery without angina pectoris: Secondary | ICD-10-CM

## 2016-08-20 DIAGNOSIS — R42 Dizziness and giddiness: Secondary | ICD-10-CM

## 2016-08-20 DIAGNOSIS — I5032 Chronic diastolic (congestive) heart failure: Secondary | ICD-10-CM | POA: Diagnosis not present

## 2016-08-20 DIAGNOSIS — I10 Essential (primary) hypertension: Secondary | ICD-10-CM | POA: Diagnosis not present

## 2016-08-20 NOTE — Progress Notes (Signed)
Cardiology Office Note   Date:  08/20/2016   ID:  Ruth Roth, DOB 1928/08/26, MRN [id number]  PCP:  Ruth Pheasant, MD  Cardiologist:   Ruth Sacramento, MD   Chief Complaint  Patient presents with  . other    12 month follow up. Meds reviewed by the pt. verbally. Pt. c/o bilateral leg pain when walking,.       History of Present Illness: Ruth Roth is a 83 y.o. female who presents for a follow-up visit regarding coronary artery disease and chronic diastolic heart failure . She has known history of hypertension, peptic ulcer disease, fibrocystic breast disease, inflammatory arthritis, autoimmune hepatitis and neuropathy.  She had a small non-ST elevation myocardial infarction in March 2016 with acute diastolic heart failure after a GI illness.  Echocardiogram showed normal LV systolic function. Cardiac catheterization showed showed 60% ostial left circumflex hazy stenosis which was possibly the culprit. Mild LAD/RCA disease. She was treated medically. Plavix was discontinued due to rash. She had recent dizziness and vertigo and went to the emergency room for evaluation. The episode was sudden and was associated with nausea. MRI of the head was negative for CVA. She was referred to ENT and carotid Doppler. Otherwise she has been doing reasonably well with no chest pain or shortness of breath. She reports bilateral leg pain with walking uphill which does not limit her physical activities.  Past Medical History:  Diagnosis Date  . Autoimmune hepatitis ([place])    followed by Dr Ruth Roth  . CHF (congestive heart failure) ([place])   . Coronary artery disease    Non-ST elevation myocardial infarction in March 2016. Cardiac catheterization showed 60% ostial left circumflex hazy stenosis which was possibly the culprit. Mild LAD/RCA disease. EF 60% by echo.  . Fibrocystic breast disease   . History of colon polyps 2000  . Hypercholesterolemia   . Hypertension   . Inflammatory arthritis   .  MI (myocardial infarction) ([place])   . Neuropathy   . Osteoarthritis   . 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 Thyroid disease     Past Surgical History:  Procedure Laterality Date  . ABDOMINAL HYSTERECTOMY  1963   partial, secondary to fibroids  . APPENDECTOMY    . Billroth    . CARDIAC CATHETERIZATION  05/12/14   [facility]  . CHOLECYSTECTOMY  1998  . COLONOSCOPY  2009  . UPPER GI ENDOSCOPY  2004     Current Outpatient Prescriptions  Medication Sig Dispense Refill  . amLODipine (NORVASC) 10 MG tablet Take 1 tablet (10 mg total) by mouth daily. 90 tablet 3  . aspirin 81 MG tablet Take 81 mg by mouth daily.      . brimonidine-timolol (COMBIGAN) 0.2-0.5 % ophthalmic solution 1 drop 2 (two) times daily.     . furosemide (LASIX) 20 MG tablet TAKE 1 TABLET(20 MG) BY MOUTH DAILY 30 tablet 5  . gabapentin (NEURONTIN) 300 MG capsule TAKE 2 CAPSULES(600 MG) BY MOUTH FOUR TIMES DAILY 720 capsule 0  . ipratropium (ATROVENT) 0.06 % nasal spray Place 1 spray into both nostrils as needed.     Marland Kitchen levothyroxine (SYNTHROID, LEVOTHROID) 112 MCG tablet TAKE 1 TABLET BY MOUTH EVERY DAY BEFORE BREAKFAST 90 tablet 11  . magnesium oxide (MAG-OX) 400 MG tablet Take 1 tablet (400 mg total) by mouth daily. 90 tablet 1  . metoprolol (LOPRESSOR) 50 MG tablet Take 1 tablet (50 mg total) by  mouth 2 (two) times daily. 180 tablet 3  . pantoprazole (PROTONIX) 40 MG tablet Take 1 tablet (40 mg total) by mouth daily. 90 tablet 1  . traMADol (ULTRAM) 50 MG tablet Take 1 tablet (50 mg total) by mouth 2 (two) times daily as needed. 180 tablet 0  . ursodiol (ACTIGALL) 300 MG capsule Take 300 mg by mouth. 2 morning - 1 night      No current facility-administered medications for this visit.     Allergies:   Altace [ramipril]; Haldol [haloperidol lactate]; Librax [chlordiazepoxide-clidinium]; Statins; and Plavix [clopidogrel bisulfate]    Social History:  The patient   reports that she has never smoked. She has never used smokeless tobacco. She reports that she does not drink alcohol or use drugs.   Family History:  The patient's family history includes Cancer in her daughter; Esophageal cancer in her brother; Stroke in her mother.    ROS:  Please see the history of present illness.   Otherwise, review of systems are positive for none.   All other systems are reviewed and negative.    PHYSICAL EXAM: VS:  BP 130/60 (BP Location: Left Arm, Patient Position: Sitting, Cuff Size: Normal)   Pulse (!) 57   Ht 5\' 3"  (1.6 m)   Wt 141 lb 12 oz (64.3 kg)   BMI 25.11 kg/m  , BMI Body mass index is 25.11 kg/m. GEN: Well nourished, well developed, in no acute distress  HEENT: normal  Neck: no JVD, carotid bruits, or masses Cardiac: RRR; no , rubs, or gallops,no edema . One out of 6 systolic ejection murmur at the aortic area Respiratory:  clear to auscultation bilaterally, normal work of breathing GI: soft, nontender, nondistended, + BS MS: no deformity or atrophy  Skin: warm and dry, no rash Neuro:  Strength and sensation are intact Psych: euthymic mood, full affect Vascular: Femoral pulses are normal. Posterior tibial is normal bilaterally.  EKG:  EKG is ordered today. The ekg ordered today demonstrates sinus bradycardia with no significant ST or T wave changes.   Recent Labs: 07/29/2016: ALT 13 08/08/2016: Hemoglobin 12.6; Platelets 211 08/14/2016: BUN 19; Creatinine, Ser 1.07; Potassium 4.1; Sodium 137; TSH 1.96    Lipid Panel    Component Value Date/Time   CHOL 215 (H) 07/29/2016 0950   TRIG 178.0 (H) 07/29/2016 0950   HDL 54.20 07/29/2016 0950   CHOLHDL 4 07/29/2016 0950   VLDL 35.6 07/29/2016 0950   LDLCALC 125 (H) 07/29/2016 0950   LDLDIRECT 143.5 12/17/2012 0955      Wt Readings from Last 3 Encounters:  08/20/16 141 lb 12 oz (64.3 kg)  08/09/16 142 lb 9.6 oz (64.7 kg)  08/08/16 145 lb (65.8 kg)       ASSESSMENT AND PLAN:  1.   Coronary artery disease involving native coronary arteries without angina: She is doing extremely well with no anginal symptoms. Continue medical therapy  2. Chronic diastolic heart failure: She appears to be euvolemic on small dose furosemide.  3. Essential hypertension: Blood pressure is controlled on current medications.  4. Hyperlipidemia: Not on a statin due to history of autoimmune hepatitis. Most recent LDL was 138.  5. Recent vertigo and nausea: The symptoms are suggestive of posterior brain circulation ischemia versus an inner ear problem. She has not been contacted about carotid Doppler which was ordered by Dr. Nicki Reaper. I'm going to go ahead and schedule her for this test in our office. I agree with ENT evaluation.  6. Bilateral leg  pain with activities: The symptoms are suggestive of mild claudication. However, her femoral and distal pulses are normal and thus I doubt significant peripheral arterial disease.   Disposition:   FU with me in 1 year  Signed,  Ruth Sacramento, MD  08/20/2016 2:46 PM    [place]

## 2016-08-20 NOTE — Patient Instructions (Signed)
Medication Instructions:  Your physician recommends that you continue on your current medications as directed. Please refer to the Current Medication list given to you today.   Labwork: none  Testing/Procedures: Your physician has requested that you have a carotid duplex. This test is an ultrasound of the carotid arteries in your neck. It looks at blood flow through these arteries that supply the brain with blood. Allow one hour for this exam. There are no restrictions or special instructions.    Follow-Up: Your physician wants you to follow-up in: one year with Dr.Arida. You will receive a reminder letter in the mail two months in advance. If you don't receive a letter, please call our office to schedule the follow-up appointment.   Any Other Special Instructions Will Be Listed Below (If Applicable).     If you need a refill on your cardiac medications before your next appointment, please call your pharmacy.

## 2016-09-09 ENCOUNTER — Ambulatory Visit: Payer: Medicare Other

## 2016-09-09 DIAGNOSIS — R42 Dizziness and giddiness: Secondary | ICD-10-CM | POA: Diagnosis not present

## 2016-09-10 LAB — VAS US CAROTID
LCCAPDIAS: 12 cm/s
LCCAPSYS: 77 cm/s
LEFT ECA DIAS: 0 cm/s
LEFT VERTEBRAL DIAS: -12 cm/s
Left CCA dist dias: -17 cm/s
Left CCA dist sys: -87 cm/s
Left ICA dist dias: -16 cm/s
Left ICA dist sys: -69 cm/s
Left ICA prox dias: 16 cm/s
Left ICA prox sys: 80 cm/s
RCCADSYS: -129 cm/s
RIGHT ECA DIAS: 0 cm/s
RIGHT VERTEBRAL DIAS: -16 cm/s
Right CCA prox dias: 19 cm/s
Right CCA prox sys: 107 cm/s

## 2016-09-16 DIAGNOSIS — K743 Primary biliary cirrhosis: Secondary | ICD-10-CM | POA: Diagnosis not present

## 2016-09-30 ENCOUNTER — Other Ambulatory Visit: Payer: Self-pay | Admitting: Internal Medicine

## 2016-10-02 DIAGNOSIS — M48062 Spinal stenosis, lumbar region with neurogenic claudication: Secondary | ICD-10-CM | POA: Diagnosis not present

## 2016-10-02 DIAGNOSIS — M5416 Radiculopathy, lumbar region: Secondary | ICD-10-CM | POA: Diagnosis not present

## 2016-10-02 DIAGNOSIS — M5136 Other intervertebral disc degeneration, lumbar region: Secondary | ICD-10-CM | POA: Diagnosis not present

## 2016-10-04 ENCOUNTER — Other Ambulatory Visit: Payer: Self-pay | Admitting: Internal Medicine

## 2016-10-21 DIAGNOSIS — H401131 Primary open-angle glaucoma, bilateral, mild stage: Secondary | ICD-10-CM | POA: Diagnosis not present

## 2016-11-05 ENCOUNTER — Encounter: Payer: Self-pay | Admitting: Internal Medicine

## 2016-11-05 ENCOUNTER — Ambulatory Visit (INDEPENDENT_AMBULATORY_CARE_PROVIDER_SITE_OTHER): Payer: Medicare Other | Admitting: Internal Medicine

## 2016-11-05 VITALS — BP 140/70 | HR 53 | Temp 98.2°F | Resp 12 | Ht 63.0 in | Wt 139.0 lb

## 2016-11-05 DIAGNOSIS — R739 Hyperglycemia, unspecified: Secondary | ICD-10-CM

## 2016-11-05 DIAGNOSIS — M545 Low back pain, unspecified: Secondary | ICD-10-CM

## 2016-11-05 DIAGNOSIS — I2510 Atherosclerotic heart disease of native coronary artery without angina pectoris: Secondary | ICD-10-CM | POA: Diagnosis not present

## 2016-11-05 DIAGNOSIS — M810 Age-related osteoporosis without current pathological fracture: Secondary | ICD-10-CM

## 2016-11-05 DIAGNOSIS — N289 Disorder of kidney and ureter, unspecified: Secondary | ICD-10-CM

## 2016-11-05 DIAGNOSIS — R42 Dizziness and giddiness: Secondary | ICD-10-CM

## 2016-11-05 DIAGNOSIS — E7800 Pure hypercholesterolemia, unspecified: Secondary | ICD-10-CM | POA: Diagnosis not present

## 2016-11-05 DIAGNOSIS — K219 Gastro-esophageal reflux disease without esophagitis: Secondary | ICD-10-CM | POA: Diagnosis not present

## 2016-11-05 DIAGNOSIS — I5032 Chronic diastolic (congestive) heart failure: Secondary | ICD-10-CM | POA: Diagnosis not present

## 2016-11-05 DIAGNOSIS — K754 Autoimmune hepatitis: Secondary | ICD-10-CM

## 2016-11-05 DIAGNOSIS — I10 Essential (primary) hypertension: Secondary | ICD-10-CM | POA: Diagnosis not present

## 2016-11-05 DIAGNOSIS — E063 Autoimmune thyroiditis: Secondary | ICD-10-CM

## 2016-11-05 MED ORDER — AMLODIPINE BESYLATE 10 MG PO TABS
10.0000 mg | ORAL_TABLET | Freq: Every day | ORAL | 3 refills | Status: DC
Start: 2016-11-05 — End: 2016-11-25

## 2016-11-05 MED ORDER — MAGNESIUM OXIDE 400 MG PO TABS: 400.0000 mg | ORAL_TABLET | Freq: Every day | ORAL | 1 refills | Status: DC

## 2016-11-05 MED ORDER — TRAMADOL HCL 50 MG PO TABS: 50.0000 mg | ORAL_TABLET | Freq: Two times a day (BID) | ORAL | 0 refills | Status: DC | PRN

## 2016-11-05 NOTE — Progress Notes (Signed)
Patient ID: JATAYA WANN, female   DOB: March 27, 1928, 83 y.o.   MRN: [id number]   Subjective:    Patient ID: [name], female    DOB: [birth year], 83 y.o.   MRN: [id number]  HPI  Patient here for a scheduled follow up.  Had the acute episode of dizziness as outlined in last note.  W/up included CT, MRI brain.  Still has some issues if moves quick.  She request referral to ENT. No chest pain.  No sob.  No acid reflux.  No abdominal pain.  Bowels moving.  Due reclast infusion.  Will need labs prior.  Saw Dr Sharlet Salina for her low back and leg pain.  See his note.  Wants to hold on injection.  Had left eyelid drooping.  Has seen opthalmology.  Plans to f/u. Increased stress with her daughter's health issues.  Overall she feels she is doing relatively well.     Past Medical History:  Diagnosis Date  . Autoimmune hepatitis ([place])    followed by Dr [name]  . CHF (congestive heart failure) ([place])   . Coronary artery disease    Non-ST elevation myocardial infarction in March 2016. Cardiac catheterization showed 60% ostial left circumflex hazy stenosis which was possibly the culprit. Mild LAD/RCA disease. EF 60% by echo.  . Fibrocystic breast disease   . History of colon polyps 2000  . Hypercholesterolemia   . Hypertension   . Inflammatory arthritis   . MI (myocardial infarction) ([place])   . Neuropathy   . Osteoarthritis   . 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 Thyroid disease    Past Surgical History:  Procedure Laterality Date  . ABDOMINAL HYSTERECTOMY  1963   partial, secondary to fibroids  . APPENDECTOMY    . Billroth    . CARDIAC CATHETERIZATION  05/12/14   [facility]  . CHOLECYSTECTOMY  1998  . COLONOSCOPY  2009  . UPPER GI ENDOSCOPY  2004   Family History  Problem Relation Age of Onset  . Stroke Mother   . Esophageal cancer Brother        also had lung cancer  . Cancer Daughter        colon  . Breast cancer Neg Hx   . Colon  cancer Neg Hx    Social History   Social History  . Marital status: Widowed    Spouse name: N/A  . Number of children: N/A  . Years of education: N/A   Social History Main Topics  . Smoking status: Never Smoker  . Smokeless tobacco: Never Used  . Alcohol use No  . Drug use: No  . Sexual activity: No   Other Topics Concern  . None   Social History Narrative  . None    Outpatient Encounter Prescriptions as of 11/05/2016  Medication Sig  . amLODipine (NORVASC) 10 MG tablet Take 1 tablet (10 mg total) by mouth daily.  Marland Kitchen aspirin 81 MG tablet Take 81 mg by mouth daily.    . brimonidine-timolol (COMBIGAN) 0.2-0.5 % ophthalmic solution 1 drop 2 (two) times daily.   . fluticasone (FLONASE) 50 MCG/ACT nasal spray   . furosemide (LASIX) 20 MG tablet TAKE 1 TABLET(20 MG) BY MOUTH DAILY  . gabapentin (NEURONTIN) 300 MG capsule TAKE 2 CAPSULES BY MOUTH FOUR TIMES DAILY  . ipratropium (ATROVENT) 0.06 % nasal spray Place 1 spray into both nostrils as needed.   . latanoprost (XALATAN) 0.005 % ophthalmic  solution   . levothyroxine (SYNTHROID, LEVOTHROID) 112 MCG tablet TAKE 1 TABLET BY MOUTH EVERY DAY BEFORE BREAKFAST  . magnesium oxide (MAG-OX) 400 MG tablet Take 1 tablet (400 mg total) by mouth daily.  . metoprolol (LOPRESSOR) 50 MG tablet Take 1 tablet (50 mg total) by mouth 2 (two) times daily.  . pantoprazole (PROTONIX) 40 MG tablet Take 1 tablet (40 mg total) by mouth daily.  . traMADol (ULTRAM) 50 MG tablet Take 1 tablet (50 mg total) by mouth 2 (two) times daily as needed.  . ursodiol (ACTIGALL) 300 MG capsule Take 300 mg by mouth. 2 morning - 1 night   . [DISCONTINUED] amLODipine (NORVASC) 10 MG tablet Take 1 tablet (10 mg total) by mouth daily.  . [DISCONTINUED] magnesium oxide (MAG-OX) 400 MG tablet Take 1 tablet (400 mg total) by mouth daily.  . [DISCONTINUED] traMADol (ULTRAM) 50 MG tablet Take 1 tablet (50 mg total) by mouth 2 (two) times daily as needed.   No  facility-administered encounter medications on file as of 11/05/2016.     Review of Systems  Constitutional: Negative for appetite change and unexpected weight change.  HENT: Negative for congestion and sinus pressure.   Respiratory: Negative for cough, chest tightness and shortness of breath.   Cardiovascular: Negative for chest pain, palpitations and leg swelling.  Gastrointestinal: Negative for abdominal pain, diarrhea, nausea and vomiting.  Genitourinary: Negative for difficulty urinating and dysuria.  Musculoskeletal: Positive for back pain. Negative for joint swelling.  Skin: Negative for color change and rash.  Neurological: Positive for dizziness. Negative for headaches.  Psychiatric/Behavioral: Negative for agitation and dysphoric mood.       Objective:     Pulse rechecked by me:  60  Physical Exam  Constitutional: She appears well-developed and well-nourished. No distress.  HENT:  Nose: Nose normal.  Mouth/Throat: Oropharynx is clear and moist.  Neck: Neck supple. No thyromegaly present.  Cardiovascular: Normal rate and regular rhythm.   Pulmonary/Chest: Breath sounds normal. No respiratory distress. She has no wheezes.  Abdominal: Soft. Bowel sounds are normal. There is no tenderness.  Musculoskeletal: She exhibits no edema or tenderness.  Lymphadenopathy:    She has no cervical adenopathy.  Skin: No rash noted. No erythema.  Psychiatric: She has a normal mood and affect. Her behavior is normal.    BP 140/70 (BP Location: Left Arm, Patient Position: Sitting, Cuff Size: Normal)   Pulse (!) 53   Temp 98.2 F (36.8 C) (Oral)   Resp 12   Ht 5\' 3"  (1.6 m)   Wt 139 lb (63 kg)   SpO2 95%   BMI 24.62 kg/m  Wt Readings from Last 3 Encounters:  11/05/16 139 lb (63 kg)  08/20/16 141 lb 12 oz (64.3 kg)  08/09/16 142 lb 9.6 oz (64.7 kg)     Lab Results  Component Value Date   WBC 6.8 08/08/2016   HGB 12.6 08/08/2016   HCT 37.0 08/08/2016   PLT 211 08/08/2016    GLUCOSE 63 (L) 08/14/2016   CHOL 215 (H) 07/29/2016   TRIG 178.0 (H) 07/29/2016   HDL 54.20 07/29/2016   LDLDIRECT 143.5 12/17/2012   LDLCALC 125 (H) 07/29/2016   ALT 13 07/29/2016   AST 11 07/29/2016   NA 137 08/14/2016   K 4.1 08/14/2016   CL 106 08/14/2016   CREATININE 1.07 08/14/2016   BUN 19 08/14/2016   CO2 25 08/14/2016   TSH 1.96 08/14/2016   INR 0.9 05/10/2014   HGBA1C 6.0  07/29/2016    Mr Lumbar Spine Wo Contrast  Result Date: 08/19/2016 CLINICAL DATA:  Low back pain with right hip and leg pain. Worsening weakness. EXAM: MRI LUMBAR SPINE WITHOUT CONTRAST TECHNIQUE: Multiplanar, multisequence MR imaging of the lumbar spine was performed. No intravenous contrast was administered. COMPARISON:  CT abdomen 04/22/2014 FINDINGS: Segmentation:  5 lumbar type vertebral bodies. Alignment:  2 mm anterolisthesis L4-5 and L5-S1. Vertebrae: Old healed superior endplate fracture at L2 with loss of height of 20%. Superior endplate Schmorl's node L4. Conus medullaris: Extends to the L2 level and appears normal. Paraspinal and other soft tissues: Simple appearing cyst at the upper pole the right kidney, not completely evaluated. Disc levels: T12-L1 and L1-2:  Minimal disc bulges.  No stenosis. L2-3: Mild bulging of the disc. Mild facet and ligamentous hypertrophy. Mild stenosis of the lateral recesses right more than left. L3-4: Severe multifactorial spinal stenosis. Broad-based herniation of the disc in combination with facet and ligamentous hypertrophy. Neural compression likely at this level. L4-5: Severe multifactorial spinal stenosis. Broad-based herniation of the disc in combination with facet and ligamentous hypertrophy. Neural compression likely at this level. L5-S1: Facet arthropathy with 2 mm of anterolisthesis. Bulging of the disc. Narrowing of the subarticular lateral recesses which could possibly affect the S1 nerves. Definite neural compression not established. IMPRESSION: Severe  multifactorial spinal stenosis at L3-4 and L4-5 that could cause neural compression on either or both sides. Bilateral subarticular lateral recess stenosis at L5-S1 because of facet arthropathy with 2 mm anterolisthesis and bulging of the disc. Mild, grossly non-compressive degenerative changes at L1-2 and L2-3. Old healed benign-appearing superior endplate fracture at L2. Electronically Signed   By: Nelson Chimes M.D.   On: 08/19/2016 11:18       Assessment & Plan:   Problem List Items Addressed This Visit    Autoimmune hepatitis ([place])    Followed by GI.  Stable.  Follow liver panel.        Relevant Orders   Hepatic function panel   Back pain    Saw Dr Sharlet Salina. Note reviewed.  Wants to hold on injection at this time.  Follow.       Relevant Medications   traMADol (ULTRAM) 50 MG tablet   Chronic diastolic heart failure ([place])    Followed by cardiology.  Stable.       Relevant Medications   amLODipine (NORVASC) 10 MG tablet   Coronary artery disease    Followed by cardiology.  Stable.  Continue risk factor modification.       Relevant Medications   amLODipine (NORVASC) 10 MG tablet   Dizziness    Had the acute dizziness as outlined in last note.  CT negative.  MRI brain - no acute CVA.  Continues on aspirin.  Had discussed ENT evaluation as outlined last note.  Request referral.        Relevant Orders   Ambulatory referral to ENT   GERD (gastroesophageal reflux disease)    Controlled on protonix.       Relevant Medications   magnesium oxide (MAG-OX) 400 MG tablet   Hypercholesterolemia    Low cholesterol diet and exercise.  Follow lipid panel.        Relevant Medications   amLODipine (NORVASC) 10 MG tablet   Other Relevant Orders   Lipid panel   Hypertension    Blood pressure as outlined.  Same medication regimen.  Follow metabolic panel.       Relevant Medications   amLODipine (  NORVASC) 10 MG tablet   Other Relevant Orders   Basic metabolic panel    Osteoporosis    Due for reclast infusion.  Will need labs prior.        Relevant Orders   VITAMIN D 25 Hydroxy (Vit-D Deficiency, Fractures)   Urinalysis, Routine w reflex microscopic   Renal insufficiency    Most recent creatinine 1.07.  Follow.  Avoid antiinflammatories.       Thyroiditis, lymphocytic    Followed by Dr Harlow Asa.  Last evaluated 05/2016.  Follow tsh.        Other Visit Diagnoses    Hyperglycemia    -  Primary   Relevant Orders   Hemoglobin A1c       Einar Pheasant, MD

## 2016-11-05 NOTE — Progress Notes (Signed)
Pre-visit discussion using our clinic review tool. No additional management support is needed unless otherwise documented below in the visit note.  

## 2016-11-06 DIAGNOSIS — K743 Primary biliary cirrhosis: Secondary | ICD-10-CM | POA: Diagnosis not present

## 2016-11-06 DIAGNOSIS — K754 Autoimmune hepatitis: Secondary | ICD-10-CM | POA: Diagnosis not present

## 2016-11-07 ENCOUNTER — Encounter: Payer: Self-pay | Admitting: Internal Medicine

## 2016-11-07 NOTE — Assessment & Plan Note (Signed)
Controlled on protonix.   

## 2016-11-07 NOTE — Assessment & Plan Note (Signed)
Had the acute dizziness as outlined in last note.  CT negative.  MRI brain - no acute CVA.  Continues on aspirin.  Had discussed ENT evaluation as outlined last note.  Request referral.

## 2016-11-07 NOTE — Assessment & Plan Note (Signed)
Followed by cardiology. Stable.   

## 2016-11-07 NOTE — Assessment & Plan Note (Signed)
Saw Dr Sharlet Salina. Note reviewed.  Wants to hold on injection at this time.  Follow.

## 2016-11-07 NOTE — Assessment & Plan Note (Signed)
Followed by Dr Harlow Asa.  Last evaluated 05/2016.  Follow tsh.

## 2016-11-07 NOTE — Assessment & Plan Note (Signed)
Blood pressure as outlined.  Same medication regimen.  Follow metabolic panel.

## 2016-11-07 NOTE — Assessment & Plan Note (Signed)
Most recent creatinine 1.07.  Follow.  Avoid antiinflammatories.

## 2016-11-07 NOTE — Assessment & Plan Note (Signed)
Followed by cardiology.  Stable.  Continue risk factor modification.   

## 2016-11-07 NOTE — Assessment & Plan Note (Signed)
Due for reclast infusion.  Will need labs prior.

## 2016-11-07 NOTE — Assessment & Plan Note (Signed)
Low cholesterol diet and exercise.  Follow lipid panel.   

## 2016-11-07 NOTE — Assessment & Plan Note (Signed)
Followed by GI.  Stable.  Follow liver panel.

## 2016-11-22 ENCOUNTER — Emergency Department
Admission: EM | Admit: 2016-11-22 | Discharge: 2016-11-22 | Disposition: A | Discharge status: Home / Self Care | Payer: Medicare Other | Attending: Emergency Medicine | Admitting: Emergency Medicine

## 2016-11-22 ENCOUNTER — Emergency Department: Payer: Medicare Other

## 2016-11-22 ENCOUNTER — Other Ambulatory Visit: Payer: Self-pay

## 2016-11-22 ENCOUNTER — Encounter: Payer: Self-pay | Admitting: Emergency Medicine

## 2016-11-22 DIAGNOSIS — I110 Hypertensive heart disease with heart failure: Secondary | ICD-10-CM | POA: Insufficient documentation

## 2016-11-22 DIAGNOSIS — I2510 Atherosclerotic heart disease of native coronary artery without angina pectoris: Secondary | ICD-10-CM | POA: Insufficient documentation

## 2016-11-22 DIAGNOSIS — E039 Hypothyroidism, unspecified: Secondary | ICD-10-CM | POA: Insufficient documentation

## 2016-11-22 DIAGNOSIS — R0602 Shortness of breath: Secondary | ICD-10-CM | POA: Diagnosis not present

## 2016-11-22 DIAGNOSIS — I5032 Chronic diastolic (congestive) heart failure: Secondary | ICD-10-CM | POA: Insufficient documentation

## 2016-11-22 DIAGNOSIS — I4891 Unspecified atrial fibrillation: Secondary | ICD-10-CM

## 2016-11-22 DIAGNOSIS — Z7982 Long term (current) use of aspirin: Secondary | ICD-10-CM | POA: Diagnosis not present

## 2016-11-22 DIAGNOSIS — Z79899 Other long term (current) drug therapy: Secondary | ICD-10-CM | POA: Insufficient documentation

## 2016-11-22 LAB — CBC WITH DIFFERENTIAL/PLATELET
BASOS PCT: 0 %
Basophils Absolute: 0 10*3/uL (ref 0–0.1)
EOS ABS: 0.2 10*3/uL (ref 0–0.7)
EOS PCT: 3 %
HCT: 36.1 % (ref 35.0–47.0)
HEMOGLOBIN: 12.3 g/dL (ref 12.0–16.0)
LYMPHS ABS: 1.6 10*3/uL (ref 1.0–3.6)
Lymphocytes Relative: 20 %
MCH: 31.1 pg (ref 26.0–34.0)
MCHC: 34 g/dL (ref 32.0–36.0)
MCV: 91.6 fL (ref 80.0–100.0)
Monocytes Absolute: 0.6 10*3/uL (ref 0.2–0.9)
Monocytes Relative: 8 %
NEUTROS PCT: 69 %
Neutro Abs: 5.4 10*3/uL (ref 1.4–6.5)
PLATELETS: 188 10*3/uL (ref 150–440)
RBC: 3.94 MIL/uL (ref 3.80–5.20)
RDW: 14.3 % (ref 11.5–14.5)
WBC: 7.8 10*3/uL (ref 3.6–11.0)

## 2016-11-22 LAB — BASIC METABOLIC PANEL
ANION GAP: 10 (ref 5–15)
BUN: 27 mg/dL — ABNORMAL HIGH (ref 6–20)
CALCIUM: 9 mg/dL (ref 8.9–10.3)
CO2: 23 mmol/L (ref 22–32)
CREATININE: 1.16 mg/dL — AB (ref 0.44–1.00)
Chloride: 103 mmol/L (ref 101–111)
GFR calc Af Amer: 48 mL/min — ABNORMAL LOW (ref >60)
GFR, EST NON AFRICAN AMERICAN: 41 mL/min — AB (ref >60)
GLUCOSE: 111 mg/dL — AB (ref 65–99)
Potassium: 3.6 mmol/L (ref 3.5–5.1)
Sodium: 136 mmol/L (ref 135–145)

## 2016-11-22 LAB — PROTIME-INR
INR: 0.99
Prothrombin Time: 13 seconds (ref 11.4–15.2)

## 2016-11-22 LAB — TROPONIN I: Troponin I: <0.03 ng/mL (ref <0.03)

## 2016-11-22 MED ORDER — APIXABAN 5 MG PO TABS
5.0000 mg | ORAL_TABLET | Freq: Once | ORAL | Status: AC
  Administered 2016-11-22: 5 mg via ORAL
  Filled 2016-11-22: qty 1

## 2016-11-22 MED ORDER — APIXABAN 5 MG PO TABS: 5.0000 mg | ORAL_TABLET | Freq: Two times a day (BID) | ORAL | 1 refills | Status: DC

## 2016-11-22 NOTE — ED Provider Notes (Signed)
ED ECG REPORT I, Darel Hong, the attending physician, personally viewed and interpreted this ECG.  Date: 11/22/2016 Rate: 106 Rhythm: Atrial fibrillation adequately rate controlled QRS Axis: normal Intervals: normal ST/T Wave abnormalities: normal Narrative Interpretation: no evidence of acute ischemia abnormal EKG    Darel Hong, MD 11/22/16 1658

## 2016-11-22 NOTE — ED Triage Notes (Addendum)
Patient presents to ED via POV from home due to hypertension. Patient states her BP at home was 197/98. Patient is currently takes ativan and lopressor. Patient denies CP.

## 2016-11-22 NOTE — ED Notes (Signed)
See triage note  States she has not been feeling well for couple of days  And having some exertional SOB.  Earlier today she had some palpations and was sob  Now denies any pain

## 2016-11-22 NOTE — Discharge Instructions (Signed)
You are experiencing symptomatic atrial fibrillation. This condition puts your at increased risk for blood clots. You should take the Eliquis (apixaban) twice daily, as directed. Follow-up with Dr. Fletcher Anon next week for further evaluation and management. Return to the ED immediately for any chest pain, shortness of breath, or weakness. Continue your other home meds as previously prescribed.

## 2016-11-22 NOTE — ED Notes (Signed)
Pt discharged to home.  Family member driving.  Discharge instructions reviewed.  Verbalized understanding.  No questions or concerns at this time.  Teach back verified.  Pt in NAD.  No items left in ED.   

## 2016-11-22 NOTE — ED Provider Notes (Signed)
[facility] Emergency Department Provider Note ____________________________________________  Time seen: 1618  I have reviewed the triage vital signs and the nursing notes.  HISTORY  Chief Complaint  Hypertension  HPI Ruth Roth is a 83 y.o. female presentsto the ED with complaints of feeling weak, short of breath, and noticing flutters in her chest for the last few days. She also notes elevated blood pressures today. She checked her BP at home and at a local pharmacy and recalls systolic readings 564-332 and diastolic readings up to 84. She was also concerned because her pulse readings were around 64. She denies any chest pain, nausea, vomiting, diaphoresis, or syncope. She denies any fevers, chills, or anorexia. She has a history of HTN, CHF, MI, and a fib. She was started pm Plavix in 2016, following her MI. She subsequently discontinued the medicine due to reported side effect of burning /itching, and Dr. Fletcher Anon never restarted any anticoagulation therapy. She is present with her adult daughter and son-in-law.   Past Medical History:  Diagnosis Date  . Autoimmune hepatitis ([place])    followed by Dr [name]  . CHF (congestive heart failure) ([place])   . Coronary artery disease    Non-ST elevation myocardial infarction in March 2016. Cardiac catheterization showed 60% ostial left circumflex hazy stenosis which was possibly the culprit. Mild LAD/RCA disease. EF 60% by echo.  . Fibrocystic breast disease   . History of colon polyps 2000  . Hypercholesterolemia   . Hypertension   . Inflammatory arthritis   . MI (myocardial infarction) ([place])   . Neuropathy   . Osteoarthritis   . 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 Thyroid disease     Patient Active Problem List   Diagnosis Date Noted  . Dizziness 08/09/2016  . Back pain 05/19/2016  . Acute URI 04/05/2015  . Abnormal CXR 10/02/2014  . Chronic diastolic  heart failure ([place]) 05/24/2014  . Coronary artery disease   . Dyspnea 05/09/2014  . Renal insufficiency 05/06/2014  . Palpitations 04/17/2014  . Health care maintenance 04/17/2014  . Unspecified gastritis and gastroduodenitis without mention of hemorrhage 11/04/2012  . GERD (gastroesophageal reflux disease) 10/18/2012  . Autoimmune hepatitis ([place]) 01/20/2012  . Osteoporosis 01/20/2012  . Hypertension 01/16/2012  . Hypercholesterolemia 01/16/2012  . Hypothyroidism 04/30/2011  . Thyroiditis, lymphocytic 04/30/2011    Past Surgical History:  Procedure Laterality Date  . ABDOMINAL HYSTERECTOMY  1963   partial, secondary to fibroids  . APPENDECTOMY    . Billroth    . CARDIAC CATHETERIZATION  05/12/14   [facility]  . CHOLECYSTECTOMY  1998  . COLONOSCOPY  2009  . UPPER GI ENDOSCOPY  2004    Prior to Admission medications   Medication Sig Start Date End Date Taking? Authorizing Provider  amLODipine (NORVASC) 10 MG tablet Take 1 tablet (10 mg total) by mouth daily. 11/05/16   Einar Pheasant, MD  apixaban (ELIQUIS) 5 MG TABS tablet Take 1 tablet (5 mg total) by mouth 2 (two) times daily. 11/22/16   Nastashia Gallo, Dannielle Karvonen, PA-C  aspirin 81 MG tablet Take 81 mg by mouth daily.      [provider]  brimonidine-timolol (COMBIGAN) 0.2-0.5 % ophthalmic solution 1 drop 2 (two) times daily.  04/03/16   [provider]  fluticasone Asencion Islam) 50 MCG/ACT nasal spray  08/27/16   [provider]  furosemide (LASIX) 20 MG tablet TAKE 1 TABLET(20 MG) BY MOUTH DAILY 09/30/16  Einar Pheasant, MD  gabapentin (NEURONTIN) 300 MG capsule TAKE 2 CAPSULES BY MOUTH FOUR TIMES DAILY 10/04/16   Einar Pheasant, MD  ipratropium (ATROVENT) 0.06 % nasal spray Place 1 spray into both nostrils as needed.  04/24/14   [provider]  latanoprost (XALATAN) 0.005 % ophthalmic solution  10/28/16   [provider]  levothyroxine (SYNTHROID, LEVOTHROID) 112 MCG tablet TAKE 1 TABLET BY  MOUTH EVERY DAY BEFORE BREAKFAST 05/16/16   Einar Pheasant, MD  magnesium oxide (MAG-OX) 400 MG tablet Take 1 tablet (400 mg total) by mouth daily. 11/05/16   Einar Pheasant, MD  metoprolol (LOPRESSOR) 50 MG tablet Take 1 tablet (50 mg total) by mouth 2 (two) times daily. 05/16/16   Einar Pheasant, MD  pantoprazole (PROTONIX) 40 MG tablet Take 1 tablet (40 mg total) by mouth daily. 08/09/16   Lavone Nian, MD  traMADol (ULTRAM) 50 MG tablet Take 1 tablet (50 mg total) by mouth 2 (two) times daily as needed. 11/05/16 11/05/17  Einar Pheasant, MD  ursodiol (ACTIGALL) 300 MG capsule Take 300 mg by mouth. 2 morning - 1 night     [provider]   Allergies Altace [ramipril]; Haldol [haloperidol lactate]; Librax [chlordiazepoxide-clidinium]; Statins; and Plavix [clopidogrel bisulfate]  Family History  Problem Relation Age of Onset  . Stroke Mother   . Esophageal cancer Brother        also had lung cancer  . Cancer Daughter        colon  . Breast cancer Neg Hx   . Colon cancer Neg Hx     Social History Social History  Substance Use Topics  . Smoking status: Never Smoker  . Smokeless tobacco: Never Used  . Alcohol use No    Review of Systems  Constitutional: Negative for fever. Eyes: Negative for visual changes. ENT: Negative for sore throat. Cardiovascular: Negative for chest pain. Reports heart flutters.  Respiratory: Positive for shortness of breath. Musculoskeletal: No back pain.  Gastrointestinal: Negative for abdominal pain, vomiting and diarrhea. Genitourinary: Negative for dysuria. Neurological: Negative for headaches, focal weakness or numbness. ____________________________________________  PHYSICAL EXAM:  VITAL SIGNS: ED Triage Vitals  Enc Vitals Group     BP 11/22/16 1557 (!) 116/92     Pulse Rate 11/22/16 1557 85     Resp 11/22/16 1557 17     Temp 11/22/16 1557 97.9 F (36.6 C)     Temp Source 11/22/16 1557 Oral     SpO2 11/22/16 1557 96 %     Weight  11/22/16 1558 137 lb (62.1 kg)     Height 11/22/16 1558 5\' 2"  (1.575 m)     Head Circumference --      Peak Flow --      Pain Score --      Pain Loc --      Pain Edu? --      Excl. in [place]? --     Constitutional: Alert and oriented. Well appearing and in no distress. Head: Normocephalic and atraumatic. Eyes: Conjunctivae are normal. PERRL. Normal extraocular movements Ears: Canals clear. TMs intact bilaterally. Nose: No congestion/rhinorrhea/epistaxis. Mouth/Throat: Mucous membranes are moist. Neck: Supple. No thyromegaly. Hematological/Lymphatic/Immunological: No cervical lymphadenopathy. Cardiovascular: Irregular rate, irregular rhythm. No murmurs, rubs, or gallops. Normal distal pulses. Respiratory: Normal respiratory effort. No wheezes/rales/rhonchi. Gastrointestinal: Soft and nontender. No distention. Musculoskeletal: Nontender with normal range of motion in all extremities.  Neurologic:  Normal gait without ataxia. Normal speech and language. No gross focal neurologic deficits are appreciated. ____________________________________________  LABS (pertinent positives/negatives)  Labs Reviewed  BASIC METABOLIC PANEL - Abnormal; Notable for the following:       Result Value   Glucose, Bld 111 (*)    BUN 27 (*)    Creatinine, Ser 1.16 (*)    GFR calc non Af Amer 41 (*)    GFR calc Af Amer 48 (*)    All other components within normal limits  CBC WITH DIFFERENTIAL/PLATELET  TROPONIN I  PROTIME-INR  ____________________________________________  EKG  Atrial fibrillation with premature complexes Ventricular rate 106  No STEMI ____________________________________________   RADIOLOGY  CXR  IMPRESSION: 1. No acute cardiopulmonary process. 2. Stable mild cardiomegaly and pulmonary vascular congestion without overt edema. 3. Stable hyperinflation and bronchitic changes consistent with underlying COPD. 4. Chronic linear atelectasis versus scarring in the left lung  base. 5.  Aortic Atherosclerosis (ICD10-170.0) ____________________________________________  PROCEDURES  apixiban 5 mg PO ____________________________________________  INITIAL IMPRESSION / ASSESSMENT AND PLAN / ED COURSE  Patient with a history of Afib without current anticoagulation therapy, who presents to the ED with symptomatic Afib. She was seen by Dr. Fletcher Anon in June for her routine, annual cardio visit. Her EKG revealed sinus brady at that time. She notes some intermittent weakness over the last few days along with some mild dyspnea on exertion and flutters. She had denied any chest pain, paralysis, or syncope.  ----------------------------------------- 7:12 PM on 11/22/2016 ----------------------------------------- S/W Dr. [facility]). He suggested if the patient was unstable, admission would be appropriate. Otherwise, if patient was stable for discharge, she could be started on apixiban and seen in the office for cardioversion.  Discussed the option of admission with the patient (and her family). The patient decided she wanted to be discharged for outpatient follow-up.   She has stable vital signs and is without chest pain. She understands the emergent return precautions related to her Afib and risk for clotting. She verbalized understanding. She is discharged with a prescription for apixiban. She will follow-up with Dr. Fletcher Anon next week as discussed. Return to the ED as needed. ____________________________________________  FINAL CLINICAL IMPRESSION(S) / ED DIAGNOSES  Final diagnoses:  Atrial fibrillation, unspecified type [facility])     Houa Ackert, Dannielle Karvonen, PA-C 11/22/16 2140    Darel Hong, MD 11/22/16 2348

## 2016-11-25 ENCOUNTER — Encounter: Payer: Self-pay | Admitting: Cardiovascular Disease

## 2016-11-25 ENCOUNTER — Ambulatory Visit (INDEPENDENT_AMBULATORY_CARE_PROVIDER_SITE_OTHER): Payer: Medicare Other | Admitting: Cardiovascular Disease

## 2016-11-25 VITALS — BP 120/60 | HR 110 | Ht 62.0 in | Wt 137.5 lb

## 2016-11-25 DIAGNOSIS — I5032 Chronic diastolic (congestive) heart failure: Secondary | ICD-10-CM

## 2016-11-25 DIAGNOSIS — I2510 Atherosclerotic heart disease of native coronary artery without angina pectoris: Secondary | ICD-10-CM | POA: Diagnosis not present

## 2016-11-25 DIAGNOSIS — I4819 Other persistent atrial fibrillation: Secondary | ICD-10-CM

## 2016-11-25 DIAGNOSIS — I10 Essential (primary) hypertension: Secondary | ICD-10-CM | POA: Diagnosis not present

## 2016-11-25 DIAGNOSIS — I481 Persistent atrial fibrillation: Secondary | ICD-10-CM | POA: Diagnosis not present

## 2016-11-25 DIAGNOSIS — E785 Hyperlipidemia, unspecified: Secondary | ICD-10-CM

## 2016-11-25 MED ORDER — METOPROLOL TARTRATE 25 MG PO TABS: 25.0000 mg | ORAL_TABLET | Freq: Two times a day (BID) | ORAL | 3 refills | Status: DC

## 2016-11-25 MED ORDER — AMLODIPINE BESYLATE 5 MG PO TABS: 5.0000 mg | ORAL_TABLET | Freq: Every day | ORAL | 3 refills | Status: DC

## 2016-11-25 MED ORDER — APIXABAN 5 MG PO TABS: 5.0000 mg | ORAL_TABLET | Freq: Two times a day (BID) | ORAL | 3 refills | Status: DC

## 2016-11-25 NOTE — Progress Notes (Signed)
Cardiology Office Note   Date:  11/25/2016   ID:  Ruth Roth, DOB July 09, 1928, MRN [id number]  PCP:  Einar Pheasant, MD  Cardiologist:   [name], MD   Chief Complaint  Patient presents with  . other    Follow up from [facility]  ER; HTN. Pt. c/o shortness of breath, fatigue and fluttering in chest.       History of Present Illness: Ruth Roth is a 83 y.o. female who presents for a follow-up visit regarding coronary artery disease ,chronic diastolic heart failure And recently diagnosed atrial fibrillation .  She has known history of hypertension, peptic ulcer disease, fibrocystic breast disease, inflammatory arthritis, autoimmune hepatitis and neuropathy.  She had a small non-ST elevation myocardial infarction in March 2016 with acute diastolic heart failure after a GI illness.  Echocardiogram showed normal LV systolic function. Cardiac catheterization showed showed 60% ostial left circumflex hazy stenosis which was possibly the culprit. Mild LAD/RCA disease. She was treated medically.   She recently developed palpitations and shortness of breath. She went to the emergency room at [facility] and was found to be in atrial fibrillation which is a new diagnosis. Her labs were remarkable for slightly elevated creatinine at 1.16 with a BUN of 27. She was started on Eliquis for anticoagulation. She continues to feel short of breath with palpitations. No chest pain.  Past Medical History:  Diagnosis Date  . Autoimmune hepatitis ([place])    followed by Dr [name]  . CHF (congestive heart failure) ([place])   . Coronary artery disease    Non-ST elevation myocardial infarction in March 2016. Cardiac catheterization showed 60% ostial left circumflex hazy stenosis which was possibly the culprit. Mild LAD/RCA disease. EF 60% by echo.  . Fibrocystic breast disease   . History of colon polyps 2000  . Hypercholesterolemia   . Hypertension   . Inflammatory arthritis   . MI (myocardial infarction) ([place])    . Neuropathy   . Osteoarthritis   . 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 Thyroid disease     Past Surgical History:  Procedure Laterality Date  . ABDOMINAL HYSTERECTOMY  1963   partial, secondary to fibroids  . APPENDECTOMY    . Billroth    . CARDIAC CATHETERIZATION  05/12/14   [facility]  . CHOLECYSTECTOMY  1998  . COLONOSCOPY  2009  . UPPER GI ENDOSCOPY  2004     Current Outpatient Prescriptions  Medication Sig Dispense Refill  . amLODipine (NORVASC) 10 MG tablet Take 1 tablet (10 mg total) by mouth daily. 90 tablet 3  . apixaban (ELIQUIS) 5 MG TABS tablet Take 1 tablet (5 mg total) by mouth 2 (two) times daily. 30 tablet 1  . aspirin 81 MG tablet Take 81 mg by mouth daily.      . brimonidine-timolol (COMBIGAN) 0.2-0.5 % ophthalmic solution 1 drop 2 (two) times daily.     . fluticasone (FLONASE) 50 MCG/ACT nasal spray     . furosemide (LASIX) 20 MG tablet TAKE 1 TABLET(20 MG) BY MOUTH DAILY 90 tablet 5  . gabapentin (NEURONTIN) 300 MG capsule TAKE 2 CAPSULES BY MOUTH FOUR TIMES DAILY 720 capsule 0  . ipratropium (ATROVENT) 0.06 % nasal spray Place 1 spray into both nostrils as needed.     . latanoprost (XALATAN) 0.005 % ophthalmic solution     . levothyroxine (SYNTHROID, LEVOTHROID) 112 MCG tablet TAKE 1 TABLET BY MOUTH EVERY DAY  BEFORE BREAKFAST 90 tablet 11  . magnesium oxide (MAG-OX) 400 MG tablet Take 1 tablet (400 mg total) by mouth daily. 90 tablet 1  . metoprolol (LOPRESSOR) 50 MG tablet Take 1 tablet (50 mg total) by mouth 2 (two) times daily. 180 tablet 3  . pantoprazole (PROTONIX) 40 MG tablet Take 1 tablet (40 mg total) by mouth daily. 90 tablet 1  . traMADol (ULTRAM) 50 MG tablet Take 1 tablet (50 mg total) by mouth 2 (two) times daily as needed. 180 tablet 0  . ursodiol (ACTIGALL) 300 MG capsule Take 300 mg by mouth. 2 morning - 1 night      No current facility-administered medications for this visit.      Allergies:   Altace [ramipril]; Haldol [haloperidol lactate]; Librax [chlordiazepoxide-clidinium]; Statins; and Plavix [clopidogrel bisulfate]    Social History:  The patient  reports that she has never smoked. She has never used smokeless tobacco. She reports that she does not drink alcohol or use drugs.   Family History:  The patient's family history includes Cancer in her daughter; Esophageal cancer in her brother; Stroke in her mother.    ROS:  Please see the history of present illness.   Otherwise, review of systems are positive for none.   All other systems are reviewed and negative.    PHYSICAL EXAM: VS:  BP 120/60 (BP Location: Left Arm, Patient Position: Sitting, Cuff Size: Normal)   Pulse (!) 110   Ht 5\' 2"  (1.575 m)   Wt 137 lb 8 oz (62.4 kg)   BMI 25.15 kg/m  , BMI Body mass index is 25.15 kg/m. GEN: Well nourished, well developed, in no acute distress  HEENT: normal  Neck: no JVD, carotid bruits, or masses Cardiac: Irregularly irregular and mildly tachycardic; no , rubs, or gallops,no edema . 1/ 6 systolic ejection murmur at the aortic area Respiratory:  clear to auscultation bilaterally, normal work of breathing GI: soft, nontender, nondistended, + BS MS: no deformity or atrophy  Skin: warm and dry, no rash Neuro:  Strength and sensation are intact Psych: euthymic mood, full affect Vascular: Femoral pulses are normal. Posterior tibial is normal bilaterally.  EKG:  EKG is ordered today. The ekg ordered today demonstrates atrial fibrillation with rapid ventricular response. Possible old inferior infarct. Nonspecific ST T wave changes in the lateral leads.   Recent Labs: 07/29/2016: ALT 13 08/14/2016: TSH 1.96 11/22/2016: BUN 27; Creatinine, Ser 1.16; Hemoglobin 12.3; Platelets 188; Potassium 3.6; Sodium 136    Lipid Panel    Component Value Date/Time   CHOL 215 (H) 07/29/2016 0950   TRIG 178.0 (H) 07/29/2016 0950   HDL 54.20 07/29/2016 0950   CHOLHDL 4  07/29/2016 0950   VLDL 35.6 07/29/2016 0950   LDLCALC 125 (H) 07/29/2016 0950   LDLDIRECT 143.5 12/17/2012 0955      Wt Readings from Last 3 Encounters:  11/25/16 137 lb 8 oz (62.4 kg)  11/22/16 137 lb (62.1 kg)  11/05/16 139 lb (63 kg)       ASSESSMENT AND PLAN:  1.  Persistent atrial fibrillation with rapid ventricular response: This is a new diagnosis the likely cause of her current symptoms of palpitations and shortness of breath. Ventricular rate is still not well-controlled. Thus, I elected to increase metoprolol to 75 mg twice daily. Continue anticoagulation with Eliquis. I requested an echocardiogram. Follow-up in 2 weeks from now. If she remains in atrial fibrillation, I recommend proceeding with cardioversion after 3 weeks of anticoagulation. I discontinued  aspirin to minimize the risk of bleeding.  2. Coronary artery disease involving native coronary arteries without angina:  Continue medical therapy.  3. Chronic diastolic heart failure: I suspect that a lot of her leg edema might be related to amlodipine. Given slight worsening of renal function, I discontinued furosemide. I asked her to notify me if leg edema and dyspnea get worse.  4. Essential hypertension: I decreased amlodipine to 5 mg daily and increased metoprolol as outlined above.  5. Hyperlipidemia: Not on a statin due to history of autoimmune hepatitis. Most recent LDL was 138.   Disposition:   FU with me in 2 weeks  Signed,  [name], MD  11/25/2016 3:45 PM    [place]

## 2016-11-25 NOTE — Patient Instructions (Signed)
Medication Instructions:  Your physician has recommended you make the following change in your medication:  1- STOP Aspirin. 2- STOP Lasix. 3- DECREASE Amlodipine to 5 mg by mouth once a day. 4- INCREASE Metoprolol to 75 mg by mouth two times a day. Take an additional 25 mg tablet along with your 50 mg tablet to equal 75 mg total in the morning and in the evening.   Labwork: none  Testing/Procedures: Your physician has requested that you have an echocardiogram. Echocardiography is a painless test that uses sound waves to create images of your heart. It provides your doctor with information about the size and shape of your heart and how well your heart's chambers and valves are working. This procedure takes approximately one hour. There are no restrictions for this procedure.    Follow-Up: Your physician recommends that you schedule a follow-up appointment in: 2 [place].    If you need a refill on your cardiac medications before your next appointment, please call your pharmacy.

## 2016-11-27 ENCOUNTER — Telehealth: Payer: Self-pay | Admitting: Cardiovascular Disease

## 2016-11-27 NOTE — Telephone Encounter (Signed)
Pt calling stating she is to have some dental work done They are replacing a dental bridge  She had is scheduled next week and will call back to reschedule with them But would like to know from the issues she's been having heart wise is it okay for her to call back and reschedule  Please advise

## 2016-11-28 NOTE — Telephone Encounter (Signed)
Pt returning our call  Dr Nyra Capes in [place]   [phone]

## 2016-11-28 NOTE — Telephone Encounter (Signed)
Left message on machine for patient to contact the office.   

## 2016-11-29 NOTE — Telephone Encounter (Signed)
S/w [place] at [employer]. Pt having top left bridge replacement. No extraction, not invasive, no concern for bleeding. Advised [place] to call our office for advise if pt needs clearance or would need to stop eliquis.  Left detailed message and call back number if questions on patient's VM.

## 2016-12-02 ENCOUNTER — Other Ambulatory Visit: Payer: Self-pay

## 2016-12-02 ENCOUNTER — Telehealth: Payer: Self-pay | Admitting: Cardiovascular Disease

## 2016-12-02 MED ORDER — METOPROLOL TARTRATE 100 MG PO TABS: 100.0000 mg | ORAL_TABLET | Freq: Two times a day (BID) | ORAL | 3 refills | Status: DC

## 2016-12-02 MED ORDER — AMLODIPINE BESYLATE 2.5 MG PO TABS: 2.5000 mg | ORAL_TABLET | Freq: Every day | ORAL | 3 refills | Status: DC

## 2016-12-02 NOTE — Telephone Encounter (Signed)
Pt w/newly diagnosed afib on Sept 14. Started on eliquis 5mg . Metoprolol increased to 75mg  BID at Sept 17 OV due to palpitations and SOB.  Echo tomorrow and f/u w/Dr. Fletcher Anon Oct 8 w/plans for DCCV if still in afib. Pt reports continued fluttering in her chest with slight improvement. Dyspnea on exertion. States she can't do anything without feeling fatigued. Yesterday, BP 133/77, HR 77 at bedtime. She has no other readings to report. Denies chest pain or any other sx .  Advised pt to continue all medications and she understands importance of not missing any eliquis doses. She will monitor VS daily and keep echo and f/u appt. Route to Dr. Fletcher Anon for consideration of increasing metoprolol.

## 2016-12-02 NOTE — Telephone Encounter (Signed)
Reviewed medication changes w/pt who verbalized understanding and repeated back to me. She will continue to monitor VS and sx and call if sx worsen.  Medication list updated.

## 2016-12-02 NOTE — Telephone Encounter (Signed)
Pt states she is having a lot of fluttering. She states she has a hard time breating when she gets up and doesn't get her breath until she sets down. Please call.

## 2016-12-02 NOTE — Telephone Encounter (Signed)
Increase metoprolol to 100 mg bid and decrease Amlodipine to 2.5 mg once daily.

## 2016-12-03 ENCOUNTER — Other Ambulatory Visit: Payer: Self-pay

## 2016-12-03 ENCOUNTER — Ambulatory Visit (INDEPENDENT_AMBULATORY_CARE_PROVIDER_SITE_OTHER): Payer: Medicare Other

## 2016-12-03 DIAGNOSIS — I4819 Other persistent atrial fibrillation: Secondary | ICD-10-CM

## 2016-12-03 DIAGNOSIS — I481 Persistent atrial fibrillation: Secondary | ICD-10-CM

## 2016-12-05 ENCOUNTER — Other Ambulatory Visit: Payer: Self-pay

## 2016-12-05 DIAGNOSIS — I5032 Chronic diastolic (congestive) heart failure: Secondary | ICD-10-CM

## 2016-12-05 MED ORDER — FUROSEMIDE 20 MG PO TABS: 20.0000 mg | ORAL_TABLET | Freq: Every day | ORAL | 1 refills | Status: DC

## 2016-12-08 ENCOUNTER — Telehealth: Payer: Self-pay | Admitting: Student

## 2016-12-08 ENCOUNTER — Telehealth: Payer: Self-pay | Admitting: Cardiology

## 2016-12-08 NOTE — Telephone Encounter (Signed)
Patient's daughter called stating that her BP is running high. She called earlier today and SBP was 150's ever since her meds were adjusted with afib.  She is taking Lopressor 100mg  BID and it was recommended earlier today by the PA on call that she increase her Amodipine to 5mg  daily.  She just now took the higher dose of amlodipine (5mg ) at 10pm.  She was also complaining of some mild DOE and LE edema over the past 2 days and she was also instructed to take an extra Lasix 20mg  but she did not do this because she did not want to have to urinate more.  I instructed her to take her extra lasix now.  Also reassured her daughter that it has been less than an hour since she took the amlodipine and BP should start coming down.  Instructed patient to call office in am with repeat BP readings.

## 2016-12-08 NOTE — Telephone Encounter (Addendum)
   The patient's daughter called to report her BP has been elevated with SBP in the 150's ever since her medications were adjusted. HR has been stable in the 70's - 80's. Confirmed that she is taking Lopressor 100mg  BID. Recommended increasing her Amlodipine from 2.5mg  daily to 5mg  daily (already has 5mg  tablets) and continue to follow BP closely with this adjustment.   She has noticed a dry cough and mild dyspnea and lower extremity edema over the past 2 days. Reports weights have been stable. She is currently on Lasix 20mg  daily. I recommended she take an additional tablet this afternoon as her symptoms are concerning for some fluid accumulation. Sodium and fluid restriction reviewed. She will notify the office if symptoms persist.   She voiced understanding of this and was appreciative of the call.   Signed, Erma Heritage, PA-C 12/08/2016, 4:06 PM Pager: [phone]

## 2016-12-09 NOTE — Telephone Encounter (Signed)
Called patient who reports her BP last night improved and it is better this AM. Patient feels a little better this AM. She has no swelling "on the outside". She tookd amlodipine 5mg  as instructed last night and additional lasix 20mg .   BP readings: Last night 148/71 This AM 132/75  Routed to Dr. Fletcher Anon for follow up or med change recommendations if any.

## 2016-12-09 NOTE — Telephone Encounter (Signed)
Continue metoprolol 100 mg twice daily, amlodipine 5 mg once daily and furosemide 20 mg once daily.

## 2016-12-10 NOTE — Addendum Note (Signed)
Addended by: Fidel Levy on: 12/10/2016 08:35 AM   Modules accepted: Orders

## 2016-12-10 NOTE — Telephone Encounter (Signed)
Patient called w/MD recommendations. She states she already has amlodipine 5mg  tablets at home, so no need for new Rx. She voiced understanding of her BP meds, dose, freq. Med list updated.

## 2016-12-16 ENCOUNTER — Ambulatory Visit (INDEPENDENT_AMBULATORY_CARE_PROVIDER_SITE_OTHER): Payer: Medicare Other | Admitting: Cardiovascular Disease

## 2016-12-16 ENCOUNTER — Encounter: Payer: Self-pay | Admitting: Cardiovascular Disease

## 2016-12-16 ENCOUNTER — Other Ambulatory Visit
Admission: RE | Admit: 2016-12-16 | Discharge: 2016-12-16 | Disposition: A | Discharge status: Home / Self Care | Payer: Medicare Other | Source: Ambulatory Visit | Attending: Cardiovascular Disease | Admitting: Cardiovascular Disease

## 2016-12-16 VITALS — BP 117/71 | HR 96 | Ht 62.0 in | Wt 135.5 lb

## 2016-12-16 DIAGNOSIS — I10 Essential (primary) hypertension: Secondary | ICD-10-CM | POA: Diagnosis not present

## 2016-12-16 DIAGNOSIS — I481 Persistent atrial fibrillation: Secondary | ICD-10-CM | POA: Diagnosis not present

## 2016-12-16 DIAGNOSIS — I5032 Chronic diastolic (congestive) heart failure: Secondary | ICD-10-CM | POA: Diagnosis not present

## 2016-12-16 DIAGNOSIS — I2510 Atherosclerotic heart disease of native coronary artery without angina pectoris: Secondary | ICD-10-CM | POA: Diagnosis not present

## 2016-12-16 DIAGNOSIS — Z01812 Encounter for preprocedural laboratory examination: Secondary | ICD-10-CM | POA: Insufficient documentation

## 2016-12-16 DIAGNOSIS — I4819 Other persistent atrial fibrillation: Secondary | ICD-10-CM

## 2016-12-16 LAB — BASIC METABOLIC PANEL
ANION GAP: 11 (ref 5–15)
BUN: 27 mg/dL — AB (ref 6–20)
CALCIUM: 8.8 mg/dL — AB (ref 8.9–10.3)
CO2: 23 mmol/L (ref 22–32)
Chloride: 103 mmol/L (ref 101–111)
Creatinine, Ser: 1.3 mg/dL — ABNORMAL HIGH (ref 0.44–1.00)
GFR calc Af Amer: 42 mL/min — ABNORMAL LOW (ref >60)
GFR, EST NON AFRICAN AMERICAN: 36 mL/min — AB (ref >60)
GLUCOSE: 94 mg/dL (ref 65–99)
POTASSIUM: 4.7 mmol/L (ref 3.5–5.1)
SODIUM: 137 mmol/L (ref 135–145)

## 2016-12-16 LAB — CBC
HCT: 36.4 % (ref 35.0–47.0)
Hemoglobin: 12.2 g/dL (ref 12.0–16.0)
MCH: 30.9 pg (ref 26.0–34.0)
MCHC: 33.5 g/dL (ref 32.0–36.0)
MCV: 92.2 fL (ref 80.0–100.0)
PLATELETS: 218 10*3/uL (ref 150–440)
RBC: 3.95 MIL/uL (ref 3.80–5.20)
RDW: 14.8 % — AB (ref 11.5–14.5)
WBC: 5.9 10*3/uL (ref 3.6–11.0)

## 2016-12-16 LAB — PROTIME-INR
INR: 1.32
PROTHROMBIN TIME: 16.3 s — AB (ref 11.4–15.2)

## 2016-12-16 NOTE — Progress Notes (Signed)
Cardiology Office Note   Date:  12/16/2016   ID:  [name], DOB 1928-11-16, MRN [id number]  PCP:  Einar Pheasant, MD  Cardiologist:   [name], MD   Chief Complaint  Patient presents with  . other    3 week follow up. Patient c/o SOB and racing heart. Patient states she has a rash and thinks it is coming from the Eliquis. Meds reviewed verbally with patient.       History of Present Illness: Ruth Roth is a 83 y.o. female who presents for a follow-up visit regarding coronary artery disease ,chronic diastolic heart failure and recently diagnosed atrial fibrillation .  She has known history of hypertension, peptic ulcer disease, fibrocystic breast disease, inflammatory arthritis, autoimmune hepatitis and neuropathy.  She had a small non-ST elevation myocardial infarction in March 2016 with acute diastolic heart failure after a GI illness.  Echocardiogram showed normal LV systolic function. Cardiac catheterization showed showed 60% ostial left circumflex hazy stenosis which was possibly the culprit. Mild LAD/RCA disease. She was treated medically.   She was recently diagnosed with A. fib with RVR. Ventricular rate has been difficult to control in spite of gradual increase of metoprolol. She also developed worsening shortness of breath and leg edema. This improved with furosemide. Echocardiogram showed normal LV systolic function with an EF of 50-55%, moderate to severe mitral regurgitation, mildly dilated left atrium, moderate tricuspid regurgitation and moderate to severe pulmonary hypertension with systolic pressure of 60 mmHg.  She continues to complain of exertional dyspnea and palpitations.  Past Medical History:  Diagnosis Date  . Autoimmune hepatitis ([place])    followed by Dr [name]  . CHF (congestive heart failure) ([place])   . Coronary artery disease    Non-ST elevation myocardial infarction in March 2016. Cardiac catheterization showed 60% ostial left circumflex  hazy stenosis which was possibly the culprit. Mild LAD/RCA disease. EF 60% by echo.  . Fibrocystic breast disease   . History of colon polyps 2000  . Hypercholesterolemia   . Hypertension   . Inflammatory arthritis   . MI (myocardial infarction) ([place])   . Neuropathy   . Osteoarthritis   . 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 Thyroid disease     Past Surgical History:  Procedure Laterality Date  . ABDOMINAL HYSTERECTOMY  1963   partial, secondary to fibroids  . APPENDECTOMY    . Billroth    . CARDIAC CATHETERIZATION  05/12/14   [facility]  . CHOLECYSTECTOMY  1998  . COLONOSCOPY  2009  . UPPER GI ENDOSCOPY  2004     Current Outpatient Prescriptions  Medication Sig Dispense Refill  . amLODipine (NORVASC) 5 MG tablet Take 5 mg by mouth daily.    Marland Kitchen apixaban (ELIQUIS) 5 MG TABS tablet Take 1 tablet (5 mg total) by mouth 2 (two) times daily. 180 tablet 3  . brimonidine-timolol (COMBIGAN) 0.2-0.5 % ophthalmic solution 1 drop 2 (two) times daily.     . fluticasone (FLONASE) 50 MCG/ACT nasal spray     . furosemide (LASIX) 20 MG tablet Take 1 tablet (20 mg total) by mouth daily. 90 tablet 1  . gabapentin (NEURONTIN) 300 MG capsule TAKE 2 CAPSULES BY MOUTH FOUR TIMES DAILY 720 capsule 0  . ipratropium (ATROVENT) 0.06 % nasal spray Place 1 spray into both nostrils as needed.     . latanoprost (XALATAN) 0.005 % ophthalmic solution     .  levothyroxine (SYNTHROID, LEVOTHROID) 112 MCG tablet TAKE 1 TABLET BY MOUTH EVERY DAY BEFORE BREAKFAST 90 tablet 11  . magnesium oxide (MAG-OX) 400 MG tablet Take 1 tablet (400 mg total) by mouth daily. 90 tablet 1  . metoprolol tartrate (LOPRESSOR) 100 MG tablet Take 1 tablet (100 mg total) by mouth 2 (two) times daily. 180 tablet 3  . pantoprazole (PROTONIX) 40 MG tablet Take 1 tablet (40 mg total) by mouth daily. 90 tablet 1  . traMADol (ULTRAM) 50 MG tablet Take 1 tablet (50 mg total) by mouth 2 (two)  times daily as needed. 180 tablet 0  . ursodiol (ACTIGALL) 300 MG capsule Take 300 mg by mouth. 2 morning - 1 night      No current facility-administered medications for this visit.     Allergies:   Altace [ramipril]; Haldol [haloperidol lactate]; Librax [chlordiazepoxide-clidinium]; Statins; and Plavix [clopidogrel bisulfate]    Social History:  The patient  reports that she has never smoked. She has never used smokeless tobacco. She reports that she does not drink alcohol or use drugs.   Family History:  The patient's family history includes Cancer in her daughter; Esophageal cancer in her brother; Stroke in her mother.    ROS:  Please see the history of present illness.   Otherwise, review of systems are positive for none.   All other systems are reviewed and negative.    PHYSICAL EXAM: VS:  BP 117/71 (BP Location: Left Arm, Patient Position: Sitting, Cuff Size: Normal)   Pulse 96   Ht 5\' 2"  (1.575 m)   Wt 135 lb 8 oz (61.5 kg)   BMI 24.78 kg/m  , BMI Body mass index is 24.78 kg/m. GEN: Well nourished, well developed, in no acute distress  HEENT: normal  Neck: no JVD, carotid bruits, or masses Cardiac: Irregularly irregular and mildly tachycardic; no , rubs, or gallops,no edema . 1/ 6 systolic ejection murmur at the aortic area Respiratory:  clear to auscultation bilaterally, normal work of breathing GI: soft, nontender, nondistended, + BS MS: no deformity or atrophy  Skin: warm and dry, no rash Neuro:  Strength and sensation are intact Psych: euthymic mood, full affect   EKG:  EKG is ordered today. The ekg ordered today demonstrates atrial fibrillation with ventricular rate of 96 bpm. Nonspecific ST and T wave changes.  Recent Labs: 07/29/2016: ALT 13 08/14/2016: TSH 1.96 11/22/2016: BUN 27; Creatinine, Ser 1.16; Hemoglobin 12.3; Platelets 188; Potassium 3.6; Sodium 136    Lipid Panel    Component Value Date/Time   CHOL 215 (H) 07/29/2016 0950   TRIG 178.0 (H)  07/29/2016 0950   HDL 54.20 07/29/2016 0950   CHOLHDL 4 07/29/2016 0950   VLDL 35.6 07/29/2016 0950   LDLCALC 125 (H) 07/29/2016 0950   LDLDIRECT 143.5 12/17/2012 0955      Wt Readings from Last 3 Encounters:  12/16/16 135 lb 8 oz (61.5 kg)  11/25/16 137 lb 8 oz (62.4 kg)  11/22/16 137 lb (62.1 kg)       ASSESSMENT AND PLAN:  1.  Persistent atrial fibrillation:  In spite of better rate control, she continues to be highly symptomatic. Due to that, I recommend proceeding with cardioversion. I discussed the procedure in details as well as dyspnea and benefits. She has not missed any doses of Eliquis.  She does complain of rash and itching since she was started on Eliquis and she is likely allergic to it. However, I'm going to keep her on this medication  for now at least until after cardioversion in order to avoid interruption of anticoagulation and then switch her to Xarelto down the road.  2. Coronary artery disease involving native coronary arteries without angina:  Continue medical therapy.  3. Chronic diastolic heart failure: This worsened recently in the setting of A. fib. Symptoms improved with furosemide.  4. Essential hypertension: Blood pressure is controlled on current medications.  5. Hyperlipidemia: Not on a statin due to history of autoimmune hepatitis. Most recent LDL was 138.   Disposition:   FU with me in few weeks after cardioversion.  Signed,  [name], MD  12/16/2016 2:46 PM    [place]

## 2016-12-16 NOTE — Patient Instructions (Addendum)
Medication Instructions:  Your physician recommends that you continue on your current medications as directed. Please refer to the Current Medication list given to you today.   Labwork: BMET, CBC, PT/INR this week at the [facility] lab. No appointment necessary.  Testing/Procedures: Your physician has recommended that you have a Cardioversion (DCCV). Electrical Cardioversion uses a jolt of electricity to your heart either through paddles or wired patches attached to your chest. This is a controlled, usually prescheduled, procedure. Defibrillation is done under light anesthesia in the hospital, and you usually go home the day of the procedure. This is done to get your heart back into a normal rhythm. You are not awake for the procedure. Please see the instruction sheet given to you today.  Thursday, October 18 [place]. Check in at the first desk on the right.  Arrival time: 6:30am Nothing to eat or drink after midnight. You may take your morning medications with a sip of water  Follow-Up: Your physician recommends that you schedule a follow-up appointment in: 1 month with Dr. Fletcher Anon.    Any Other Special Instructions Will Be Listed Below (If Applicable).     If you need a refill on your cardiac medications before your next appointment, please call your pharmacy.  . Electrical Cardioversion Electrical cardioversion is the delivery of a jolt of electricity to restore a normal rhythm to the heart. A rhythm that is too fast or is not regular keeps the heart from pumping well. In this procedure, sticky patches or metal paddles are placed on the chest to deliver electricity to the heart from a device. This procedure may be done in an emergency if:  There is low or no blood pressure as a result of the heart rhythm.  Normal rhythm must be restored as fast as possible to protect the brain and heart from further damage.  It may save a life.  This procedure may also be done for  irregular or fast heart rhythms that are not immediately life-threatening. Tell a health care provider about:  Any allergies you have.  All medicines you are taking, including vitamins, herbs, eye drops, creams, and over-the-counter medicines.  Any problems you or family members have had with anesthetic medicines.  Any blood disorders you have.  Any surgeries you have had.  Any medical conditions you have.  Whether you are pregnant or may be pregnant. What are the risks? Generally, this is a safe procedure. However, problems may occur, including:  Allergic reactions to medicines.  A blood clot that breaks free and travels to other parts of your body.  The possible return of an abnormal heart rhythm within hours or days after the procedure.  Your heart stopping (cardiac arrest). This is rare.  What happens before the procedure? Medicines  Your health care provider may have you start taking: ? Blood-thinning medicines (anticoagulants) so your blood does not clot as easily. ? Medicines may be given to help stabilize your heart rate and rhythm.  Ask your health care provider about changing or stopping your regular medicines. This is especially important if you are taking diabetes medicines or blood thinners. General instructions  Plan to have someone take you home from the hospital or clinic.  If you will be going home right after the procedure, plan to have someone with you for 24 hours.  Follow instructions from your health care provider about eating or drinking restrictions. What happens during the procedure?  To lower your risk of infection: ? Your  health care team will wash or sanitize their hands. ? Your skin will be washed with soap.  An IV tube will be inserted into one of your veins.  You will be given a medicine to help you relax (sedative).  Sticky patches (electrodes) or metal paddles may be placed on your chest.  An electrical shock will be  delivered. The procedure may vary among health care providers and hospitals. What happens after the procedure?  Your blood pressure, heart rate, breathing rate, and blood oxygen level will be monitored until the medicines you were given have worn off.  Do not drive for 24 hours if you were given a sedative.  Your heart rhythm will be watched to make sure it does not change. This information is not intended to replace advice given to you by your health care provider. Make sure you discuss any questions you have with your health care provider. Document Released: 02/15/2002 Document Revised: 10/25/2015 Document Reviewed: 09/01/2015 Elsevier Interactive Patient Education  2017 [employer].  Electrical Cardioversion, Care After This sheet gives you information about how to care for yourself after your procedure. Your health care provider may also give you more specific instructions. If you have problems or questions, contact your health care provider. What can I expect after the procedure? After the procedure, it is common to have:  Some redness on the skin where the shocks were given.  Follow these instructions at home:  Do not drive for 24 hours if you were given a medicine to help you relax (sedative).  Take over-the-counter and prescription medicines only as told by your health care provider.  Ask your health care provider how to check your pulse. Check it often.  Rest for 48 hours after the procedure or as told by your health care provider.  Avoid or limit your caffeine use as told by your health care provider. Contact a health care provider if:  You feel like your heart is beating too quickly or your pulse is not regular.  You have a serious muscle cramp that does not go away. Get help right away if:  You have discomfort in your chest.  You are dizzy or you feel faint.  You have trouble breathing or you are short of breath.  Your speech is slurred.  You have trouble  moving an arm or leg on one side of your body.  Your fingers or toes turn cold or blue. This information is not intended to replace advice given to you by your health care provider. Make sure you discuss any questions you have with your health care provider. Document Released: 12/16/2012 Document Revised: 09/29/2015 Document Reviewed: 09/01/2015 Elsevier Interactive Patient Education  [employer].

## 2016-12-19 ENCOUNTER — Other Ambulatory Visit: Payer: Self-pay

## 2016-12-19 MED ORDER — FUROSEMIDE 20 MG PO TABS: 20.0000 mg | ORAL_TABLET | ORAL | 1 refills | Status: DC

## 2016-12-23 DIAGNOSIS — E041 Nontoxic single thyroid nodule: Secondary | ICD-10-CM | POA: Diagnosis not present

## 2016-12-23 DIAGNOSIS — R42 Dizziness and giddiness: Secondary | ICD-10-CM | POA: Diagnosis not present

## 2016-12-26 ENCOUNTER — Encounter
Admission: RE | Disposition: A | Discharge status: Home / Self Care | Payer: Self-pay | Source: Ambulatory Visit | Attending: Cardiovascular Disease

## 2016-12-26 ENCOUNTER — Ambulatory Visit: Payer: Medicare Other | Admitting: Anesthesiology

## 2016-12-26 ENCOUNTER — Ambulatory Visit
Admission: RE | Admit: 2016-12-26 | Discharge: 2016-12-26 | Disposition: A | Discharge status: Home / Self Care | Payer: Medicare Other | Source: Ambulatory Visit | Attending: Cardiovascular Disease | Admitting: Cardiovascular Disease

## 2016-12-26 ENCOUNTER — Encounter: Payer: Self-pay | Admitting: *Deleted

## 2016-12-26 DIAGNOSIS — K219 Gastro-esophageal reflux disease without esophagitis: Secondary | ICD-10-CM | POA: Diagnosis not present

## 2016-12-26 DIAGNOSIS — I4891 Unspecified atrial fibrillation: Secondary | ICD-10-CM

## 2016-12-26 DIAGNOSIS — M064 Inflammatory polyarthropathy: Secondary | ICD-10-CM | POA: Insufficient documentation

## 2016-12-26 DIAGNOSIS — I481 Persistent atrial fibrillation: Secondary | ICD-10-CM | POA: Diagnosis not present

## 2016-12-26 DIAGNOSIS — Z7951 Long term (current) use of inhaled steroids: Secondary | ICD-10-CM | POA: Insufficient documentation

## 2016-12-26 DIAGNOSIS — I132 Hypertensive heart and chronic kidney disease with heart failure and with stage 5 chronic kidney disease, or end stage renal disease: Secondary | ICD-10-CM | POA: Insufficient documentation

## 2016-12-26 DIAGNOSIS — Z7901 Long term (current) use of anticoagulants: Secondary | ICD-10-CM | POA: Diagnosis not present

## 2016-12-26 DIAGNOSIS — I2510 Atherosclerotic heart disease of native coronary artery without angina pectoris: Secondary | ICD-10-CM | POA: Insufficient documentation

## 2016-12-26 DIAGNOSIS — I252 Old myocardial infarction: Secondary | ICD-10-CM | POA: Diagnosis not present

## 2016-12-26 DIAGNOSIS — I5032 Chronic diastolic (congestive) heart failure: Secondary | ICD-10-CM | POA: Insufficient documentation

## 2016-12-26 DIAGNOSIS — M1990 Unspecified osteoarthritis, unspecified site: Secondary | ICD-10-CM | POA: Diagnosis not present

## 2016-12-26 DIAGNOSIS — I509 Heart failure, unspecified: Secondary | ICD-10-CM | POA: Diagnosis not present

## 2016-12-26 DIAGNOSIS — E7800 Pure hypercholesterolemia, unspecified: Secondary | ICD-10-CM | POA: Insufficient documentation

## 2016-12-26 DIAGNOSIS — K754 Autoimmune hepatitis: Secondary | ICD-10-CM | POA: Insufficient documentation

## 2016-12-26 DIAGNOSIS — I130 Hypertensive heart and chronic kidney disease with heart failure and stage 1 through stage 4 chronic kidney disease, or unspecified chronic kidney disease: Secondary | ICD-10-CM | POA: Diagnosis not present

## 2016-12-26 DIAGNOSIS — I272 Pulmonary hypertension, unspecified: Secondary | ICD-10-CM | POA: Diagnosis not present

## 2016-12-26 DIAGNOSIS — Z8711 Personal history of peptic ulcer disease: Secondary | ICD-10-CM | POA: Insufficient documentation

## 2016-12-26 DIAGNOSIS — N186 End stage renal disease: Secondary | ICD-10-CM | POA: Diagnosis not present

## 2016-12-26 DIAGNOSIS — G629 Polyneuropathy, unspecified: Secondary | ICD-10-CM | POA: Insufficient documentation

## 2016-12-26 DIAGNOSIS — I4819 Other persistent atrial fibrillation: Secondary | ICD-10-CM

## 2016-12-26 DIAGNOSIS — E039 Hypothyroidism, unspecified: Secondary | ICD-10-CM | POA: Diagnosis not present

## 2016-12-26 DIAGNOSIS — R21 Rash and other nonspecific skin eruption: Secondary | ICD-10-CM | POA: Diagnosis not present

## 2016-12-26 HISTORY — PX: CARDIOVERSION: EP1203

## 2016-12-26 SURGERY — CARDIOVERSION (CATH LAB)
Anesthesia: General

## 2016-12-26 MED ORDER — METOPROLOL TARTRATE 100 MG PO TABS: 50.0000 mg | ORAL_TABLET | Freq: Two times a day (BID) | ORAL | 3 refills | Status: DC

## 2016-12-26 MED ORDER — PROPOFOL 10 MG/ML IV BOLUS
INTRAVENOUS | Status: DC | PRN
  Administered 2016-12-26: 40 mg via INTRAVENOUS

## 2016-12-26 MED ORDER — PROPOFOL 10 MG/ML IV BOLUS
INTRAVENOUS | Status: AC
  Filled 2016-12-26: qty 60

## 2016-12-26 MED ORDER — LIDOCAINE HCL (PF) 2 % IJ SOLN
INTRAMUSCULAR | Status: AC
  Filled 2016-12-26: qty 2

## 2016-12-26 MED ORDER — ATROPINE SULFATE 0.4 MG/ML IJ SOLN
INTRAMUSCULAR | Status: AC
  Filled 2016-12-26: qty 1

## 2016-12-26 MED ORDER — EPHEDRINE SULFATE 50 MG/ML IJ SOLN
INTRAMUSCULAR | Status: AC
  Filled 2016-12-26: qty 1

## 2016-12-26 MED ORDER — SODIUM CHLORIDE 0.9 % IV SOLN
INTRAVENOUS | Status: DC
  Administered 2016-12-26 (×2): via INTRAVENOUS

## 2016-12-26 MED ORDER — LIDOCAINE HCL (CARDIAC) 20 MG/ML IV SOLN
INTRAVENOUS | Status: DC | PRN
  Administered 2016-12-26: 10 mg via INTRAVENOUS

## 2016-12-26 NOTE — Anesthesia Preprocedure Evaluation (Signed)
Anesthesia Evaluation  Patient identified by MRN, date of birth, ID band Patient awake    Reviewed: Allergy & Precautions, H&P , NPO status , Patient's Chart, lab work & pertinent test results  History of Anesthesia Complications Negative for: history of anesthetic complications  Airway Mallampati: III  TM Distance: <3 FB Neck ROM: limited    Dental  (+) Poor Dentition, Chipped, Missing   Pulmonary neg pulmonary ROS, neg shortness of breath,           Cardiovascular Exercise Tolerance: Good hypertension, (-) angina+ CAD, + Past MI and +CHF  + dysrhythmias Atrial Fibrillation      Neuro/Psych negative neurological ROS  negative psych ROS   GI/Hepatic PUD, GERD  Medicated and Controlled,(+) Hepatitis -  Endo/Other  Hypothyroidism   Renal/GU CRF and ESRFRenal disease  negative genitourinary   Musculoskeletal  (+) Arthritis ,   Abdominal   Peds  Hematology negative hematology ROS (+)   Anesthesia Other Findings Past Medical History: No date: Autoimmune hepatitis ([place])     Comment:  followed by Dr [name] No date: CHF (congestive heart failure) ([place]) No date: Coronary artery disease     Comment:  Non-ST elevation myocardial infarction in March 2016.               Cardiac catheterization showed 60% ostial left circumflex              hazy stenosis which was possibly the culprit. Mild               LAD/RCA disease. EF 60% by echo. No date: Fibrocystic breast disease No date: Hypercholesterolemia No date: Hypertension No date: Inflammatory arthritis No date: MI (myocardial infarction) ([place]) No date: Neuropathy No date: Osteoarthritis No date: PUD (peptic ulcer disease)     Comment:  requiring Billroth II surgery with resulting dumping               syndrome No date: Thyroid disease  Past Surgical History: 1963: ABDOMINAL HYSTERECTOMY     Comment:  partial, secondary to fibroids No date: APPENDECTOMY No  date: Billroth 05/12/14: CARDIAC CATHETERIZATION     Comment:  [facility] 1998: CHOLECYSTECTOMY 2009: COLONOSCOPY 2004: UPPER GI ENDOSCOPY  BMI    Body Mass Index:  24.69 kg/m      Reproductive/Obstetrics negative OB ROS                             Anesthesia Physical Anesthesia Plan  ASA: III  Anesthesia Plan: General   Post-op Pain Management:    Induction: Intravenous  PONV Risk Score and Plan: 3 and Propofol infusion and Treatment may vary due to age or medical condition  Airway Management Planned: Natural Airway and Nasal Cannula  Additional Equipment:   Intra-op Plan:   Post-operative Plan:   Informed Consent: I have reviewed the patients History and Physical, chart, labs and discussed the procedure including the risks, benefits and alternatives for the proposed anesthesia with the patient or authorized representative who has indicated his/her understanding and acceptance.   Dental Advisory Given  Plan Discussed with: Anesthesiologist, CRNA and Surgeon  Anesthesia Plan Comments: (Patient consented for risks of anesthesia including but not limited to:  - adverse reactions to medications - risk of intubation if required - damage to teeth, lips or other oral mucosa - sore throat or hoarseness - Damage to heart, brain, lungs or loss of life  Patient voiced understanding.)  Anesthesia Quick Evaluation  

## 2016-12-26 NOTE — Transfer of Care (Signed)
Immediate Anesthesia Transfer of Care Note  Patient: Ruth Roth  Procedure(s) Performed: CARDIOVERSION (N/A )  Patient Location: PACU  Anesthesia Type:MAC  Level of Consciousness: awake  Airway & Oxygen Therapy: Patient Spontanous Breathing and Patient connected to nasal cannula oxygen  Post-op Assessment: Report given to RN and Post -op Vital signs reviewed and stable  Post vital signs: Reviewed and stable  Last Vitals:  Vitals:   12/26/16 0654  BP: (!) 139/92  Pulse: 65  Resp: 16  SpO2: (!) 89%    Last Pain: There were no vitals filed for this visit.       Complications: No apparent anesthesia complications

## 2016-12-26 NOTE — H&P (View-Only) (Signed)
Cardiology Office Note   Date:  12/16/2016   ID:  Ruth Roth, DOB Sep 08, 1928, MRN [id number]  PCP:  Einar Pheasant, MD  Cardiologist:   [name], MD   Chief Complaint  Patient presents with  . other    3 week follow up. Patient c/o SOB and racing heart. Patient states she has a rash and thinks it is coming from the Eliquis. Meds reviewed verbally with patient.       History of Present Illness: Ruth Roth is a 83 y.o. female who presents for a follow-up visit regarding coronary artery disease ,chronic diastolic heart failure and recently diagnosed atrial fibrillation .  She has known history of hypertension, peptic ulcer disease, fibrocystic breast disease, inflammatory arthritis, autoimmune hepatitis and neuropathy.  She had a small non-ST elevation myocardial infarction in March 2016 with acute diastolic heart failure after a GI illness.  Echocardiogram showed normal LV systolic function. Cardiac catheterization showed showed 60% ostial left circumflex hazy stenosis which was possibly the culprit. Mild LAD/RCA disease. She was treated medically.   She was recently diagnosed with A. fib with RVR. Ventricular rate has been difficult to control in spite of gradual increase of metoprolol. She also developed worsening shortness of breath and leg edema. This improved with furosemide. Echocardiogram showed normal LV systolic function with an EF of 50-55%, moderate to severe mitral regurgitation, mildly dilated left atrium, moderate tricuspid regurgitation and moderate to severe pulmonary hypertension with systolic pressure of 60 mmHg.  She continues to complain of exertional dyspnea and palpitations.  Past Medical History:  Diagnosis Date  . Autoimmune hepatitis ([place])    followed by Dr [name]  . CHF (congestive heart failure) ([place])   . Coronary artery disease    Non-ST elevation myocardial infarction in March 2016. Cardiac catheterization showed 60% ostial left circumflex  hazy stenosis which was possibly the culprit. Mild LAD/RCA disease. EF 60% by echo.  . Fibrocystic breast disease   . History of colon polyps 2000  . Hypercholesterolemia   . Hypertension   . Inflammatory arthritis   . MI (myocardial infarction) ([place])   . Neuropathy   . Osteoarthritis   . 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 Thyroid disease     Past Surgical History:  Procedure Laterality Date  . ABDOMINAL HYSTERECTOMY  1963   partial, secondary to fibroids  . APPENDECTOMY    . Billroth    . CARDIAC CATHETERIZATION  05/12/14   [facility]  . CHOLECYSTECTOMY  1998  . COLONOSCOPY  2009  . UPPER GI ENDOSCOPY  2004     Current Outpatient Prescriptions  Medication Sig Dispense Refill  . amLODipine (NORVASC) 5 MG tablet Take 5 mg by mouth daily.    Marland Kitchen apixaban (ELIQUIS) 5 MG TABS tablet Take 1 tablet (5 mg total) by mouth 2 (two) times daily. 180 tablet 3  . brimonidine-timolol (COMBIGAN) 0.2-0.5 % ophthalmic solution 1 drop 2 (two) times daily.     . fluticasone (FLONASE) 50 MCG/ACT nasal spray     . furosemide (LASIX) 20 MG tablet Take 1 tablet (20 mg total) by mouth daily. 90 tablet 1  . gabapentin (NEURONTIN) 300 MG capsule TAKE 2 CAPSULES BY MOUTH FOUR TIMES DAILY 720 capsule 0  . ipratropium (ATROVENT) 0.06 % nasal spray Place 1 spray into both nostrils as needed.     . latanoprost (XALATAN) 0.005 % ophthalmic solution     .  levothyroxine (SYNTHROID, LEVOTHROID) 112 MCG tablet TAKE 1 TABLET BY MOUTH EVERY DAY BEFORE BREAKFAST 90 tablet 11  . magnesium oxide (MAG-OX) 400 MG tablet Take 1 tablet (400 mg total) by mouth daily. 90 tablet 1  . metoprolol tartrate (LOPRESSOR) 100 MG tablet Take 1 tablet (100 mg total) by mouth 2 (two) times daily. 180 tablet 3  . pantoprazole (PROTONIX) 40 MG tablet Take 1 tablet (40 mg total) by mouth daily. 90 tablet 1  . traMADol (ULTRAM) 50 MG tablet Take 1 tablet (50 mg total) by mouth 2 (two)  times daily as needed. 180 tablet 0  . ursodiol (ACTIGALL) 300 MG capsule Take 300 mg by mouth. 2 morning - 1 night      No current facility-administered medications for this visit.     Allergies:   Altace [ramipril]; Haldol [haloperidol lactate]; Librax [chlordiazepoxide-clidinium]; Statins; and Plavix [clopidogrel bisulfate]    Social History:  The patient  reports that she has never smoked. She has never used smokeless tobacco. She reports that she does not drink alcohol or use drugs.   Family History:  The patient's family history includes Cancer in her daughter; Esophageal cancer in her brother; Stroke in her mother.    ROS:  Please see the history of present illness.   Otherwise, review of systems are positive for none.   All other systems are reviewed and negative.    PHYSICAL EXAM: VS:  BP 117/71 (BP Location: Left Arm, Patient Position: Sitting, Cuff Size: Normal)   Pulse 96   Ht 5\' 2"  (1.575 m)   Wt 135 lb 8 oz (61.5 kg)   BMI 24.78 kg/m  , BMI Body mass index is 24.78 kg/m. GEN: Well nourished, well developed, in no acute distress  HEENT: normal  Neck: no JVD, carotid bruits, or masses Cardiac: Irregularly irregular and mildly tachycardic; no , rubs, or gallops,no edema . 1/ 6 systolic ejection murmur at the aortic area Respiratory:  clear to auscultation bilaterally, normal work of breathing GI: soft, nontender, nondistended, + BS MS: no deformity or atrophy  Skin: warm and dry, no rash Neuro:  Strength and sensation are intact Psych: euthymic mood, full affect   EKG:  EKG is ordered today. The ekg ordered today demonstrates atrial fibrillation with ventricular rate of 96 bpm. Nonspecific ST and T wave changes.  Recent Labs: 07/29/2016: ALT 13 08/14/2016: TSH 1.96 11/22/2016: BUN 27; Creatinine, Ser 1.16; Hemoglobin 12.3; Platelets 188; Potassium 3.6; Sodium 136    Lipid Panel    Component Value Date/Time   CHOL 215 (H) 07/29/2016 0950   TRIG 178.0 (H)  07/29/2016 0950   HDL 54.20 07/29/2016 0950   CHOLHDL 4 07/29/2016 0950   VLDL 35.6 07/29/2016 0950   LDLCALC 125 (H) 07/29/2016 0950   LDLDIRECT 143.5 12/17/2012 0955      Wt Readings from Last 3 Encounters:  12/16/16 135 lb 8 oz (61.5 kg)  11/25/16 137 lb 8 oz (62.4 kg)  11/22/16 137 lb (62.1 kg)       ASSESSMENT AND PLAN:  1.  Persistent atrial fibrillation:  In spite of better rate control, she continues to be highly symptomatic. Due to that, I recommend proceeding with cardioversion. I discussed the procedure in details as well as dyspnea and benefits. She has not missed any doses of Eliquis.  She does complain of rash and itching since she was started on Eliquis and she is likely allergic to it. However, I'm going to keep her on this medication  for now at least until after cardioversion in order to avoid interruption of anticoagulation and then switch her to Xarelto down the road.  2. Coronary artery disease involving native coronary arteries without angina:  Continue medical therapy.  3. Chronic diastolic heart failure: This worsened recently in the setting of A. fib. Symptoms improved with furosemide.  4. Essential hypertension: Blood pressure is controlled on current medications.  5. Hyperlipidemia: Not on a statin due to history of autoimmune hepatitis. Most recent LDL was 138.   Disposition:   FU with me in few weeks after cardioversion.  Signed,  [name], MD  12/16/2016 2:46 PM    [place]

## 2016-12-26 NOTE — Discharge Instructions (Signed)
Monitor blood pressure and heart rate daily over the next week. If HR is less than 50 and/or systolic (top) blood pressure less than 110, notify dr. Tyrell Antonio office before taking metoprolol dose

## 2016-12-26 NOTE — Anesthesia Postprocedure Evaluation (Signed)
Anesthesia Post Note  Patient: Ruth Roth  Procedure(s) Performed: CARDIOVERSION (N/A )  Patient location during evaluation: Cath Lab Anesthesia Type: General Level of consciousness: awake and alert Pain management: pain level controlled Vital Signs Assessment: post-procedure vital signs reviewed and stable Respiratory status: spontaneous breathing, nonlabored ventilation, respiratory function stable and patient connected to nasal cannula oxygen Cardiovascular status: blood pressure returned to baseline and stable Postop Assessment: no apparent nausea or vomiting Anesthetic complications: no     Last Vitals:  Vitals:   12/26/16 0815 12/26/16 0830  BP: (!) 121/59 115/68  Pulse: (!) 53   Resp: 16 16  SpO2: 100% 100%    Last Pain: There were no vitals filed for this visit.               Precious Haws Arris Meyn

## 2016-12-26 NOTE — Interval H&P Note (Signed)
History and Physical Interval Note:  12/26/2016 8:00 AM  Ruth Roth  has presented today for surgery, with the diagnosis of Cardioversion  Afib  The various methods of treatment have been discussed with the patient and family. After consideration of risks, benefits and other options for treatment, the patient has consented to  Procedure(s): CARDIOVERSION (N/A) as a surgical intervention .  The patient's history has been reviewed, patient examined, no change in status, stable for surgery.  I have reviewed the patient's chart and labs.  Questions were answered to the patient's satisfaction.     [name]

## 2016-12-26 NOTE — Anesthesia Procedure Notes (Signed)
Procedure Name: MAC Date/Time: 12/26/2016 7:45 AM Performed by: Allean Found Pre-anesthesia Checklist: Patient identified, Emergency Drugs available, Suction available, Patient being monitored and Timeout performed Patient Re-evaluated:Patient Re-evaluated prior to induction Oxygen Delivery Method: Nasal cannula Induction Type: IV induction Placement Confirmation: positive ETCO2 Dental Injury: Teeth and Oropharynx as per pre-operative assessment

## 2016-12-26 NOTE — Anesthesia Post-op Follow-up Note (Signed)
Anesthesia QCDR form completed.        

## 2016-12-26 NOTE — CV Procedure (Signed)
Cardioversion note: A standard informed consent was obtained. Timeout was performed. The pads were placed in the anterior posterior fashion. The patient was given propofol by the anesthesia team.  Successful cardioversion was performed with a 200 J. The patient converted to sinus bradycardia.  Pre-and post EKGs were reviewed. The patient tolerated the procedure with no immediate complications.  Recommendations: Decrease Metoprolol to 50 mg bid. Continue other medications.

## 2016-12-31 DIAGNOSIS — R2681 Unsteadiness on feet: Secondary | ICD-10-CM | POA: Diagnosis not present

## 2017-01-01 ENCOUNTER — Other Ambulatory Visit: Payer: Self-pay | Admitting: Internal Medicine

## 2017-01-17 ENCOUNTER — Other Ambulatory Visit (INDEPENDENT_AMBULATORY_CARE_PROVIDER_SITE_OTHER): Payer: Medicare Other

## 2017-01-17 DIAGNOSIS — R739 Hyperglycemia, unspecified: Secondary | ICD-10-CM | POA: Diagnosis not present

## 2017-01-17 DIAGNOSIS — I10 Essential (primary) hypertension: Secondary | ICD-10-CM

## 2017-01-17 DIAGNOSIS — K754 Autoimmune hepatitis: Secondary | ICD-10-CM | POA: Diagnosis not present

## 2017-01-17 DIAGNOSIS — M810 Age-related osteoporosis without current pathological fracture: Secondary | ICD-10-CM

## 2017-01-17 DIAGNOSIS — E7800 Pure hypercholesterolemia, unspecified: Secondary | ICD-10-CM

## 2017-01-17 LAB — HEPATIC FUNCTION PANEL
ALK PHOS: 95 U/L (ref 39–117)
ALT: 19 U/L (ref 0–35)
AST: 16 U/L (ref 0–37)
Albumin: 4.1 g/dL (ref 3.5–5.2)
BILIRUBIN DIRECT: 0 mg/dL (ref 0.0–0.3)
BILIRUBIN TOTAL: 0.5 mg/dL (ref 0.2–1.2)
Total Protein: 6.8 g/dL (ref 6.0–8.3)

## 2017-01-17 LAB — URINALYSIS, ROUTINE W REFLEX MICROSCOPIC
BILIRUBIN URINE: NEGATIVE
HGB URINE DIPSTICK: NEGATIVE
Ketones, ur: NEGATIVE
LEUKOCYTES UA: NEGATIVE
NITRITE: NEGATIVE
RBC / HPF: NONE SEEN (ref 0–?)
Specific Gravity, Urine: 1.015 (ref 1.000–1.030)
TOTAL PROTEIN, URINE-UPE24: NEGATIVE
UROBILINOGEN UA: 0.2 (ref 0.0–1.0)
Urine Glucose: NEGATIVE
pH: 6 (ref 5.0–8.0)

## 2017-01-17 LAB — LIPID PANEL
CHOLESTEROL: 199 mg/dL (ref 0–200)
HDL: 67.3 mg/dL (ref >39.00)
LDL Cholesterol: 110 mg/dL — ABNORMAL HIGH (ref 0–99)
NonHDL: 131.22
TRIGLYCERIDES: 107 mg/dL (ref 0.0–149.0)
Total CHOL/HDL Ratio: 3
VLDL: 21.4 mg/dL (ref 0.0–40.0)

## 2017-01-17 LAB — BASIC METABOLIC PANEL
BUN: 17 mg/dL (ref 6–23)
CO2: 23 meq/L (ref 19–32)
Calcium: 9.3 mg/dL (ref 8.4–10.5)
Chloride: 108 mEq/L (ref 96–112)
Creatinine, Ser: 0.81 mg/dL (ref 0.40–1.20)
GFR: 71.15 mL/min (ref >60.00)
GLUCOSE: 85 mg/dL (ref 70–99)
POTASSIUM: 4.4 meq/L (ref 3.5–5.1)
Sodium: 138 mEq/L (ref 135–145)

## 2017-01-17 LAB — HEMOGLOBIN A1C: Hgb A1c MFr Bld: 5.8 % (ref 4.6–6.5)

## 2017-01-17 LAB — VITAMIN D 25 HYDROXY (VIT D DEFICIENCY, FRACTURES): VITD: 51.82 ng/mL (ref 30.00–100.00)

## 2017-01-17 NOTE — Addendum Note (Signed)
Addended by: Arby Barrette on: 01/17/2017 11:41 AM   Modules accepted: Orders

## 2017-01-20 ENCOUNTER — Encounter: Payer: Medicare Other | Admitting: Internal Medicine

## 2017-01-21 ENCOUNTER — Ambulatory Visit (INDEPENDENT_AMBULATORY_CARE_PROVIDER_SITE_OTHER): Payer: Medicare Other | Admitting: Internal Medicine

## 2017-01-21 ENCOUNTER — Encounter: Payer: Self-pay | Admitting: Internal Medicine

## 2017-01-21 VITALS — BP 120/70 | HR 64 | Temp 98.1°F | Resp 14 | Wt 132.0 lb

## 2017-01-21 DIAGNOSIS — Z0000 Encounter for general adult medical examination without abnormal findings: Secondary | ICD-10-CM | POA: Diagnosis not present

## 2017-01-21 DIAGNOSIS — I2510 Atherosclerotic heart disease of native coronary artery without angina pectoris: Secondary | ICD-10-CM

## 2017-01-21 DIAGNOSIS — Z23 Encounter for immunization: Secondary | ICD-10-CM

## 2017-01-21 DIAGNOSIS — R739 Hyperglycemia, unspecified: Secondary | ICD-10-CM

## 2017-01-21 DIAGNOSIS — N289 Disorder of kidney and ureter, unspecified: Secondary | ICD-10-CM | POA: Diagnosis not present

## 2017-01-21 DIAGNOSIS — K219 Gastro-esophageal reflux disease without esophagitis: Secondary | ICD-10-CM

## 2017-01-21 DIAGNOSIS — I10 Essential (primary) hypertension: Secondary | ICD-10-CM | POA: Diagnosis not present

## 2017-01-21 DIAGNOSIS — E039 Hypothyroidism, unspecified: Secondary | ICD-10-CM

## 2017-01-21 DIAGNOSIS — E7800 Pure hypercholesterolemia, unspecified: Secondary | ICD-10-CM | POA: Diagnosis not present

## 2017-01-21 DIAGNOSIS — I481 Persistent atrial fibrillation: Secondary | ICD-10-CM | POA: Diagnosis not present

## 2017-01-21 DIAGNOSIS — K754 Autoimmune hepatitis: Secondary | ICD-10-CM | POA: Diagnosis not present

## 2017-01-21 DIAGNOSIS — I5032 Chronic diastolic (congestive) heart failure: Secondary | ICD-10-CM

## 2017-01-21 DIAGNOSIS — I4819 Other persistent atrial fibrillation: Secondary | ICD-10-CM

## 2017-01-21 MED ORDER — TRAMADOL HCL 50 MG PO TABS: 50.0000 mg | ORAL_TABLET | Freq: Two times a day (BID) | ORAL | 0 refills | Status: DC | PRN

## 2017-01-21 NOTE — Assessment & Plan Note (Signed)
Physical today 01/21/17.  Mammogram 06/17/16 - Birads I.  Colonoscopy 03/19/05 - normal.  Continues to follow up with GI.

## 2017-01-21 NOTE — Progress Notes (Signed)
[name], female   DOB: Jul 03, 1928, 83 y.o.   MRN: [id number]   Subjective:    [name], female    DOB: August 16, 1928, 83 y.o.   MRN: [id number]  HPI  Patient with past history of CAD, hypercholesterolemia, and hypertension.  Comes in today to follow up on these issues as well as for a complete physical exam.   Recently diagnosed with atrial fib.  Was having issues with sob and leg edema.  Is s/p cardioversion 12/26/16.  Overall feels better.  Decreased swelling.  Breathing better.  Energy improving.  Is stronger.  No chest pain.  No increased heart rate or palpitations reported.  No acid reflux.  No abdominal pain.  Bowels moving.  Does have some itching.  Relates to eliquis.   Sees Dr Fletcher Anon this week.  States he plans to change.  Taking lasix qod.    Past Medical History:  Diagnosis Date  . Autoimmune hepatitis ([place])    followed by Dr [name]  . CHF (congestive heart failure) ([place])   . Coronary artery disease    Non-ST elevation myocardial infarction in March 2016. Cardiac catheterization showed 60% ostial left circumflex hazy stenosis which was possibly the culprit. Mild LAD/RCA disease. EF 60% by echo.  . Fibrocystic breast disease   . Hypercholesterolemia   . Hypertension   . Inflammatory arthritis   . MI (myocardial infarction) ([place])   . Neuropathy   . Osteoarthritis   . 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 Thyroid disease    Past Surgical History:  Procedure Laterality Date  . ABDOMINAL HYSTERECTOMY  1963   partial, secondary to fibroids  . APPENDECTOMY    . Billroth    . CARDIAC CATHETERIZATION  05/12/14   [facility]  . CARDIOVERSION N/A 12/26/2016   Procedure: CARDIOVERSION;  Surgeon: [name], MD;  Location: [facility] ORS;  Service: Cardiovascular;  Laterality: N/A;  . CHOLECYSTECTOMY  1998  . COLONOSCOPY  2009  . UPPER GI ENDOSCOPY  2004   Family History  Problem Relation Age of  Onset  . Stroke Mother   . Esophageal cancer Brother        also had lung cancer  . Cancer Daughter        colon  . Breast cancer Neg Hx   . Colon cancer Neg Hx    Social History   Socioeconomic History  . Marital status: Widowed    Spouse name: None  . Number of children: None  . Years of education: None  . Highest education level: None  Social Needs  . Financial resource strain: None  . Food insecurity - worry: None  . Food insecurity - inability: None  . Transportation needs - medical: None  . Transportation needs - non-medical: None  Occupational History  . None  Tobacco Use  . Smoking status: Never Smoker  . Smokeless tobacco: Never Used  Substance and Sexual Activity  . Alcohol use: No    Alcohol/week: 0.0 oz  . Drug use: No  . Sexual activity: No  Other Topics Concern  . None  Social History Narrative  . None    Outpatient Encounter Medications as of 01/21/2017  Medication Sig  . amLODipine (NORVASC) 5 MG tablet Take 5 mg by mouth at bedtime.   . brimonidine-timolol (COMBIGAN) 0.2-0.5 % ophthalmic solution Place 1 drop into the left eye 2 (two) times daily.   Marland Kitchen  dorzolamide (TRUSOPT) 2 % ophthalmic solution Place 1 drop into the left eye 2 (two) times daily.  . fluticasone (FLONASE) 50 MCG/ACT nasal spray Place 1-2 sprays into both nostrils daily as needed (for allergies.).   Marland Kitchen furosemide (LASIX) 20 MG tablet Take 1 tablet (20 mg total) by mouth every other day.  . gabapentin (NEURONTIN) 300 MG capsule TAKE 2 CAPSULES(600 MG) BY MOUTH FOUR TIMES DAILY  . Hydrocortisone (FUXNATFTD-32 EX) Apply 1 application topically 4 (four) times daily as needed (for rash.).  Marland Kitchen ipratropium (ATROVENT) 0.06 % nasal spray Place 1 spray into both nostrils 3 (three) times daily as needed (for allergies.).   Marland Kitchen latanoprost (XALATAN) 0.005 % ophthalmic solution Place 1 drop into the left eye at bedtime.   Marland Kitchen levothyroxine (SYNTHROID, LEVOTHROID) 112 MCG tablet TAKE 1 TABLET BY MOUTH  EVERY DAY BEFORE BREAKFAST  . magnesium oxide (MAG-OX) 400 MG tablet Take 1 tablet (400 mg total) by mouth daily. (Patient taking differently: Take 400 mg by mouth at bedtime. )  . metoprolol tartrate (LOPRESSOR) 100 MG tablet Take 0.5 tablets (50 mg total) by mouth 2 (two) times daily.  . pantoprazole (PROTONIX) 40 MG tablet Take 1 tablet (40 mg total) by mouth daily.  . traMADol (ULTRAM) 50 MG tablet Take 1 tablet (50 mg total) 2 (two) times daily as needed by mouth (for pain.).  Marland Kitchen ursodiol (ACTIGALL) 300 MG capsule Take 300-600 mg by mouth 2 (two) times daily. Take 2 capsules (600 mg) daily after lunch & 1 capsule (300 mg) at night.  . [DISCONTINUED] apixaban (ELIQUIS) 5 MG TABS tablet Take 1 tablet (5 mg total) by mouth 2 (two) times daily.  . [DISCONTINUED] traMADol (ULTRAM) 50 MG tablet Take 1 tablet (50 mg total) by mouth 2 (two) times daily as needed. (Patient taking differently: Take 50 mg by mouth 2 (two) times daily as needed (for pain.). )  . [DISCONTINUED] gabapentin (NEURONTIN) 300 MG capsule TAKE 2 CAPSULES BY MOUTH FOUR TIMES DAILY   No facility-administered encounter medications on file as of 01/21/2017.     Review of Systems  Constitutional: Negative for appetite change and unexpected weight change.       Energy better.   HENT: Negative for congestion and sinus pressure.   Eyes: Negative for pain and visual disturbance.  Respiratory: Negative for cough, chest tightness and shortness of breath.        Breathing better.   Cardiovascular: Negative for chest pain and palpitations.       Swelling improved.   Gastrointestinal: Negative for abdominal pain, diarrhea, nausea and vomiting.  Genitourinary: Negative for difficulty urinating and dysuria.  Musculoskeletal: Negative for back pain and joint swelling.  Skin: Negative for color change and rash.  Neurological: Negative for dizziness, light-headedness and headaches.  Hematological: Negative for adenopathy. Does not  bruise/bleed easily.  Psychiatric/Behavioral: Negative for agitation and dysphoric mood.       Objective:     Blood pressure rechecked by me:  138/72  Physical Exam  Constitutional: She is oriented to person, place, and time. She appears well-developed and well-nourished. No distress.  HENT:  Nose: Nose normal.  Mouth/Throat: Oropharynx is clear and moist.  Eyes: Right eye exhibits no discharge. Left eye exhibits no discharge. No scleral icterus.  Neck: Neck supple. No thyromegaly present.  Cardiovascular: Normal rate and regular rhythm.  Pulmonary/Chest: Breath sounds normal. No accessory muscle usage. No tachypnea. No respiratory distress. She has no decreased breath sounds. She has no wheezes. She has  no rhonchi. Right breast exhibits no inverted nipple, no mass, no nipple discharge and no tenderness (no axillary adenopathy). Left breast exhibits no inverted nipple, no mass, no nipple discharge and no tenderness (no axilarry adenopathy).  Abdominal: Soft. Bowel sounds are normal. There is no tenderness.  Musculoskeletal: She exhibits no tenderness.  No increased swelling.    Lymphadenopathy:    She has no cervical adenopathy.  Neurological: She is alert and oriented to person, place, and time.  Skin: Skin is warm. No rash noted. No erythema.  Psychiatric: She has a normal mood and affect. Her behavior is normal.    BP 120/70 (BP Location: Left Arm, Patient Position: Sitting, Cuff Size: Normal)   Pulse 64   Temp 98.1 F (36.7 C) (Oral)   Resp 14   Wt 132 lb (59.9 kg)   SpO2 98%   BMI 24.14 kg/m  Wt Readings from Last 3 Encounters:  01/21/17 132 lb (59.9 kg)  12/26/16 135 lb (61.2 kg)  12/16/16 135 lb 8 oz (61.5 kg)     Lab Results  Component Value Date   WBC 5.9 12/16/2016   HGB 12.2 12/16/2016   HCT 36.4 12/16/2016   PLT 218 12/16/2016   GLUCOSE 85 01/17/2017   CHOL 199 01/17/2017   TRIG 107.0 01/17/2017   HDL 67.30 01/17/2017   LDLDIRECT 143.5 12/17/2012    LDLCALC 110 (H) 01/17/2017   ALT 19 01/17/2017   AST 16 01/17/2017   NA 138 01/17/2017   K 4.4 01/17/2017   CL 108 01/17/2017   CREATININE 0.81 01/17/2017   BUN 17 01/17/2017   CO2 23 01/17/2017   TSH 1.96 08/14/2016   INR 1.32 12/16/2016   HGBA1C 5.8 01/17/2017       Assessment & Plan:   Problem List Items Addressed This Visit    Autoimmune hepatitis ([place])    Followed by GI.  Recent liver panel wnl.  Follow.       Chronic diastolic heart failure ([place])    Followed by cardiology.  Stable.        Coronary artery disease    Stable.  Followed by cardiology.  Continue risk factor modification.        GERD (gastroesophageal reflux disease)    Controlled on current regimen.        Health care maintenance    Physical today 01/21/17.  Mammogram 06/17/16 - Birads I.  Colonoscopy 03/19/05 - normal.  Continues to follow up with GI.        Hypercholesterolemia    Low cholesterol diet and exercise.  Unable to take statin medication due to underlying liver issue.  Follow lipid panel and liver function tests.        Relevant Orders   Lipid panel   Hepatic function panel   Hypertension    Blood pressure under good control.  Continue same medication regimen.  Follow pressures.  Follow metabolic panel.        Relevant Orders   Basic metabolic panel   Hypothyroidism    On thyroid replacement.  Follow tsh.       Persistent atrial fibrillation ([place])    S/p cardioversion.  Doing better.  Feels better.  Has f/u planned with Dr Fletcher Anon this week.       Renal insufficiency    Recent creatinine check - .81.  Improved.         Other Visit Diagnoses    Hyperglycemia    -  Primary   Relevant Orders  Hemoglobin A1c   Encounter for immunization       Relevant Orders   Flu vaccine HIGH DOSE PF (Completed)       Einar Pheasant, MD

## 2017-01-22 ENCOUNTER — Telehealth: Payer: Self-pay | Admitting: Cardiovascular Disease

## 2017-01-22 NOTE — Telephone Encounter (Signed)
Called patient to reschedule her appointment due to change in schedule.   She states she needs some samples or something to get her to the next appointment   She is coming on 02/04/17 to see Gerald Stabs   But states she only needs just enough to last her until then  She thinks we may not keep her on this medication   Eliquis 5 mg is what she is in need of  Please advise

## 2017-01-23 ENCOUNTER — Other Ambulatory Visit: Payer: Self-pay | Admitting: *Deleted

## 2017-01-23 MED ORDER — APIXABAN 5 MG PO TABS: 5.0000 mg | ORAL_TABLET | Freq: Two times a day (BID) | ORAL | 0 refills | Status: DC

## 2017-01-23 NOTE — Telephone Encounter (Signed)
Per pt request only enough for next appointment.

## 2017-01-23 NOTE — Telephone Encounter (Signed)
Requested Prescriptions   Signed Prescriptions Disp Refills  . apixaban (ELIQUIS) 5 MG TABS tablet 24 tablet 0    Sig: Take 1 tablet (5 mg total) 2 (two) times daily by mouth.    Authorizing Provider: [name] A    Ordering User: Britt Bottom

## 2017-01-24 ENCOUNTER — Ambulatory Visit: Payer: Medicare Other | Admitting: Cardiovascular Disease

## 2017-01-24 NOTE — Telephone Encounter (Signed)
Patient would like to see Dr Fletcher Anon if possible instead of PA or NP Please call if can be worked in sooner with [employer]

## 2017-01-24 NOTE — Assessment & Plan Note (Signed)
S/p cardioversion.  Doing better.  Feels better.  Has f/u planned with Dr Fletcher Anon this week.

## 2017-01-24 NOTE — Assessment & Plan Note (Signed)
Followed by cardiology. Stable.   

## 2017-01-24 NOTE — Assessment & Plan Note (Signed)
Controlled on current regimen.   

## 2017-01-24 NOTE — Assessment & Plan Note (Signed)
Stable.  Followed by cardiology.  Continue risk factor modification.   

## 2017-01-24 NOTE — Assessment & Plan Note (Signed)
Recent creatinine check - .81.  Improved.

## 2017-01-24 NOTE — Assessment & Plan Note (Signed)
Followed by GI.  Recent liver panel wnl.  Follow.

## 2017-01-24 NOTE — Assessment & Plan Note (Signed)
Blood pressure under good control.  Continue same medication regimen.  Follow pressures.  Follow metabolic panel.   

## 2017-01-24 NOTE — Assessment & Plan Note (Signed)
Low cholesterol diet and exercise.  Unable to take statin medication due to underlying liver issue.  Follow lipid panel and liver function tests.

## 2017-01-24 NOTE — Assessment & Plan Note (Signed)
On thyroid replacement.  Follow tsh.  

## 2017-01-28 ENCOUNTER — Encounter: Payer: Self-pay | Admitting: Cardiovascular Disease

## 2017-01-28 ENCOUNTER — Ambulatory Visit (INDEPENDENT_AMBULATORY_CARE_PROVIDER_SITE_OTHER): Payer: Medicare Other | Admitting: Cardiovascular Disease

## 2017-01-28 VITALS — BP 140/60 | HR 61 | Ht 62.0 in | Wt 133.5 lb

## 2017-01-28 DIAGNOSIS — E785 Hyperlipidemia, unspecified: Secondary | ICD-10-CM

## 2017-01-28 DIAGNOSIS — I481 Persistent atrial fibrillation: Secondary | ICD-10-CM

## 2017-01-28 DIAGNOSIS — I2510 Atherosclerotic heart disease of native coronary artery without angina pectoris: Secondary | ICD-10-CM

## 2017-01-28 DIAGNOSIS — I4819 Other persistent atrial fibrillation: Secondary | ICD-10-CM

## 2017-01-28 DIAGNOSIS — I5032 Chronic diastolic (congestive) heart failure: Secondary | ICD-10-CM

## 2017-01-28 DIAGNOSIS — I10 Essential (primary) hypertension: Secondary | ICD-10-CM

## 2017-01-28 MED ORDER — RIVAROXABAN 20 MG PO TABS: 20.0000 mg | ORAL_TABLET | Freq: Every day | ORAL | 5 refills | Status: DC

## 2017-01-28 NOTE — Telephone Encounter (Signed)
Dr. Fletcher Anon has opening today at 2:40pm. Pt confirms she would like this appt. S/w Hiraa who added patient.

## 2017-01-28 NOTE — Progress Notes (Signed)
Cardiology Office Note   Date:  01/28/2017   ID:  Ruth Roth, DOB 1928-10-15, MRN [id number]  PCP:  Einar Pheasant, MD  Cardiologist:   [name], MD   Chief Complaint  Patient presents with  . other    Follow up from the Cardioversion. Meds reviewed by the pt. verbally. "doing well."  Pt. c/o itching and breaking out to Eliquis and has palpitations when first lying down at night.       History of Present Illness: Ruth Roth is a 83 y.o. female who presents for a follow-up visit regarding coronary artery disease ,chronic diastolic heart failure and persistent atrial fibrillation status post recent cardioversion.  She has known history of hypertension, peptic ulcer disease, fibrocystic breast disease, inflammatory arthritis, autoimmune hepatitis and neuropathy.  She had a small non-ST elevation myocardial infarction in March 2016 with acute diastolic heart failure after a GI illness.  Echocardiogram showed normal LV systolic function. Cardiac catheterization showed showed 60% ostial left circumflex hazy stenosis which was possibly the culprit. Mild LAD/RCA disease. She was treated medically.   She had recent highly symptomatic atrial fibrillation with RVR. Echocardiogram showed normal LV systolic function with an EF of 50-55%, moderate to severe mitral regurgitation, mildly dilated left atrium, moderate tricuspid regurgitation and moderate to severe pulmonary hypertension with systolic pressure of 60 mmHg.  She underwent successful cardioversion on October 18.  She reports significant improvement in symptoms since then.  Dyspnea improved significantly. She continues to have a rash with Eliquis.  Past Medical History:  Diagnosis Date  . Autoimmune hepatitis ([place])    followed by Dr [name]  . CHF (congestive heart failure) ([place])   . Coronary artery disease    Non-ST elevation myocardial infarction in March 2016. Cardiac catheterization showed 60% ostial left circumflex  hazy stenosis which was possibly the culprit. Mild LAD/RCA disease. EF 60% by echo.  . Fibrocystic breast disease   . Hypercholesterolemia   . Hypertension   . Inflammatory arthritis   . MI (myocardial infarction) ([place])   . Neuropathy   . Osteoarthritis   . 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 Thyroid disease     Past Surgical History:  Procedure Laterality Date  . ABDOMINAL HYSTERECTOMY  1963   partial, secondary to fibroids  . APPENDECTOMY    . Billroth    . CARDIAC CATHETERIZATION  05/12/14   [facility]  . CARDIOVERSION N/A 12/26/2016   Procedure: CARDIOVERSION;  Surgeon: [name], MD;  Location: [facility] ORS;  Service: Cardiovascular;  Laterality: N/A;  . CHOLECYSTECTOMY  1998  . COLONOSCOPY  2009  . UPPER GI ENDOSCOPY  2004     Current Outpatient Medications  Medication Sig Dispense Refill  . amLODipine (NORVASC) 5 MG tablet Take 5 mg by mouth at bedtime.     Marland Kitchen apixaban (ELIQUIS) 5 MG TABS tablet Take 1 tablet (5 mg total) 2 (two) times daily by mouth. 24 tablet 0  . brimonidine-timolol (COMBIGAN) 0.2-0.5 % ophthalmic solution Place 1 drop into the left eye 2 (two) times daily.     . dorzolamide (TRUSOPT) 2 % ophthalmic solution Place 1 drop into the left eye 2 (two) times daily.    . fluticasone (FLONASE) 50 MCG/ACT nasal spray Place 1-2 sprays into both nostrils daily as needed (for allergies.).     Marland Kitchen furosemide (LASIX) 20 MG tablet Take 1 tablet (20 mg total) by mouth every other  day. 90 tablet 1  . gabapentin (NEURONTIN) 300 MG capsule TAKE 2 CAPSULES(600 MG) BY MOUTH FOUR TIMES DAILY 720 capsule 0  . Hydrocortisone (ZOXWRUEAV-40 EX) Apply 1 application topically 4 (four) times daily as needed (for rash.).    Marland Kitchen ipratropium (ATROVENT) 0.06 % nasal spray Place 1 spray into both nostrils 3 (three) times daily as needed (for allergies.).     Marland Kitchen latanoprost (XALATAN) 0.005 % ophthalmic solution Place 1 drop into the  left eye at bedtime.     Marland Kitchen levothyroxine (SYNTHROID, LEVOTHROID) 112 MCG tablet TAKE 1 TABLET BY MOUTH EVERY DAY BEFORE BREAKFAST 90 tablet 11  . magnesium oxide (MAG-OX) 400 MG tablet Take 1 tablet (400 mg total) by mouth daily. (Patient taking differently: Take 400 mg by mouth at bedtime. ) 90 tablet 1  . metoprolol tartrate (LOPRESSOR) 100 MG tablet Take 0.5 tablets (50 mg total) by mouth 2 (two) times daily. 180 tablet 3  . pantoprazole (PROTONIX) 40 MG tablet Take 1 tablet (40 mg total) by mouth daily. 90 tablet 1  . traMADol (ULTRAM) 50 MG tablet Take 1 tablet (50 mg total) 2 (two) times daily as needed by mouth (for pain.). 60 tablet 0  . ursodiol (ACTIGALL) 300 MG capsule Take 300-600 mg by mouth 2 (two) times daily. Take 2 capsules (600 mg) daily after lunch & 1 capsule (300 mg) at night.     No current facility-administered medications for this visit.     Allergies:   Statins; Altace [ramipril]; Haldol [haloperidol lactate]; Librax [chlordiazepoxide-clidinium]; and Plavix [clopidogrel bisulfate]    Social History:  The patient  reports that  has never smoked. she has never used smokeless tobacco. She reports that she does not drink alcohol or use drugs.   Family History:  The patient's family history includes Cancer in her daughter; Esophageal cancer in her brother; Stroke in her mother.    ROS:  Please see the history of present illness.   Otherwise, review of systems are positive for none.   All other systems are reviewed and negative.    PHYSICAL EXAM: VS:  BP 140/60 (BP Location: Left Arm, Patient Position: Sitting, Cuff Size: Normal)   Pulse 61   Ht 5\' 2"  (1.575 m)   Wt 133 lb 8 oz (60.6 kg)   BMI 24.42 kg/m  , BMI Body mass index is 24.42 kg/m. GEN: Well nourished, well developed, in no acute distress  HEENT: normal  Neck: no JVD, carotid bruits, or masses Cardiac: Regular rate and rhythm; no , rubs, or gallops,no edema . 1/ 6 systolic ejection murmur at the aortic  area Respiratory:  clear to auscultation bilaterally, normal work of breathing GI: soft, nontender, nondistended, + BS MS: no deformity or atrophy  Skin: warm and dry, no rash Neuro:  Strength and sensation are intact Psych: euthymic mood, full affect   EKG:  EKG is ordered today. The ekg ordered today demonstrates normal sinus rhythm with no significant ST or T wave changes.  Recent Labs: 08/14/2016: TSH 1.96 12/16/2016: Hemoglobin 12.2; Platelets 218 01/17/2017: ALT 19; BUN 17; Creatinine, Ser 0.81; Potassium 4.4; Sodium 138    Lipid Panel    Component Value Date/Time   CHOL 199 01/17/2017 1054   TRIG 107.0 01/17/2017 1054   HDL 67.30 01/17/2017 1054   CHOLHDL 3 01/17/2017 1054   VLDL 21.4 01/17/2017 1054   LDLCALC 110 (H) 01/17/2017 1054   LDLDIRECT 143.5 12/17/2012 0955      Wt Readings from Last 3  Encounters:  01/28/17 133 lb 8 oz (60.6 kg)  01/21/17 132 lb (59.9 kg)  12/26/16 135 lb (61.2 kg)       ASSESSMENT AND PLAN:  1.  Persistent atrial fibrillation:   She is maintaining in sinus rhythm after recent successful cardioversion.  She continues to have a rash with Eliquis and thus I elected to switch her to Xarelto 20 mg once daily.  2. Coronary artery disease involving native coronary arteries without angina:  Continue medical therapy.  3. Chronic diastolic heart failure: This worsened recently in the setting of A. fib. Symptoms improved with small dose furosemide.  She appears to be euvolemic.  Recent labs showed normalization of kidney function.  4. Essential hypertension: Blood pressure is controlled on current medications.  5. Hyperlipidemia: Not on a statin due to history of autoimmune hepatitis. Most recent LDL was 138.  6.  Mitral regurgitation this was in the setting of A. fib with RVR.  I will consider repeat echocardiogram in 6 months in sinus rhythm.   Disposition:   FU with me in 3 months  Signed,  [name], MD  01/28/2017 3:03 PM      [facility]

## 2017-01-28 NOTE — Telephone Encounter (Addendum)
S/w pt. She understands she has an Eliquis prescription at the pharmacy. She requests a follow up with Dr. Fletcher Anon; currently scheduled 11/28 with Ignacia Bayley, NP. Pt has been added to waitlist

## 2017-01-28 NOTE — Patient Instructions (Addendum)
Medication Instructions:  Your physician has recommended you make the following change in your medication:  STOP taking eliquis START taking xarelto 20mg  once daily   Labwork: none  Testing/Procedures: none  Follow-Up: Your physician recommends that you schedule a follow-up appointment in: 3 months with Dr. Fletcher Anon.    Any Other Special Instructions Will Be Listed Below (If Applicable).     If you need a refill on your cardiac medications before your next appointment, please call your pharmacy.  Rivaroxaban oral tablets What is this medicine? RIVAROXABAN (ri va ROX a ban) is an anticoagulant (blood thinner). It is used to treat blood clots in the lungs or in the veins. It is also used after knee or hip surgeries to prevent blood clots. It is also used to lower the chance of stroke in people with a medical condition called atrial fibrillation. This medicine may be used for other purposes; ask your health care provider or pharmacist if you have questions. COMMON BRAND NAME(S): Xarelto, Xarelto Starter Pack What should I tell my health care provider before I take this medicine? They need to know if you have any of these conditions: -bleeding disorders -bleeding in the brain -blood in your stools (black or tarry stools) or if you have blood in your vomit -history of stomach bleeding -kidney disease -liver disease -low blood counts, like low white cell, platelet, or red cell counts -recent or planned spinal or epidural procedure -take medicines that treat or prevent blood clots -an unusual or allergic reaction to rivaroxaban, other medicines, foods, dyes, or preservatives -pregnant or trying to get pregnant -breast-feeding How should I use this medicine? Take this medicine by mouth with a glass of water. Follow the directions on the prescription label. Take your medicine at regular intervals. Do not take it more often than directed. Do not stop taking except on your doctor's  advice. Stopping this medicine may increase your risk of a blood clot. Be sure to refill your prescription before you run out of medicine. If you are taking this medicine after hip or knee replacement surgery, take it with or without food. If you are taking this medicine for atrial fibrillation, take it with your evening meal. If you are taking this medicine to treat blood clots, take it with food at the same time each day. If you are unable to swallow your tablet, you may crush the tablet and mix it in applesauce. Then, immediately eat the applesauce. You should eat more food right after you eat the applesauce containing the crushed tablet. Talk to your pediatrician regarding the use of this medicine in children. Special care may be needed. Overdosage: If you think you have taken too much of this medicine contact a poison control center or emergency room at once. NOTE: This medicine is only for you. Do not share this medicine with others. What if I miss a dose? If you take your medicine once a day and miss a dose, take the missed dose as soon as you remember. If you take your medicine twice a day and miss a dose, take the missed dose immediately. In this instance, 2 tablets may be taken at the same time. The next day you should take 1 tablet twice a day as directed. What may interact with this medicine? Do not take this medicine with any of the following medications: -defibrotide This medicine may also interact with the following medications: -aspirin and aspirin-like medicines -certain antibiotics like erythromycin, azithromycin, and clarithromycin -certain medicines for fungal  infections like ketoconazole and itraconazole -certain medicines for irregular heart beat like amiodarone, quinidine, dronedarone -certain medicines for seizures like carbamazepine, phenytoin -certain medicines that treat or prevent blood clots like warfarin, enoxaparin, and  dalteparin -conivaptan -diltiazem -felodipine -indinavir -lopinavir; ritonavir -NSAIDS, medicines for pain and inflammation, like ibuprofen or naproxen -ranolazine -rifampin -ritonavir -SNRIs, medicines for depression, like desvenlafaxine, duloxetine, levomilnacipran, venlafaxine -SSRIs, medicines for depression, like citalopram, escitalopram, fluoxetine, fluvoxamine, paroxetine, sertraline -St. John's wort -verapamil This list may not describe all possible interactions. Give your health care provider a list of all the medicines, herbs, non-prescription drugs, or dietary supplements you use. Also tell them if you smoke, drink alcohol, or use illegal drugs. Some items may interact with your medicine. What should I watch for while using this medicine? Visit your doctor or health care professional for regular checks on your progress. Notify your doctor or health care professional and seek emergency treatment if you develop breathing problems; changes in vision; chest pain; severe, sudden headache; pain, swelling, warmth in the leg; trouble speaking; sudden numbness or weakness of the face, arm or leg. These can be signs that your condition has gotten worse. If you are going to have surgery or other procedure, tell your doctor that you are taking this medicine. What side effects may I notice from receiving this medicine? Side effects that you should report to your doctor or health care professional as soon as possible: -allergic reactions like skin rash, itching or hives, swelling of the face, lips, or tongue -back pain -redness, blistering, peeling or loosening of the skin, including inside the mouth -signs and symptoms of bleeding such as bloody or black, tarry stools; red or dark-brown urine; spitting up blood or brown material that looks like coffee grounds; red spots on the skin; unusual bruising or bleeding from the eye, gums, or nose Side effects that usually do not require medical  attention (report to your doctor or health care professional if they continue or are bothersome): -dizziness -muscle pain This list may not describe all possible side effects. Call your doctor for medical advice about side effects. You may report side effects to FDA at 1-800-FDA-1088. Where should I keep my medicine? Keep out of the reach of children. Store at room temperature between 15 and 30 degrees C (59 and 86 degrees F). Throw away any unused medicine after the expiration date. NOTE: This sheet is a summary. It may not cover all possible information. If you have questions about this medicine, talk to your doctor, pharmacist, or health care provider.  2018 Elsevier/Gold Standard (2015-11-15 16:29:33)

## 2017-02-01 ENCOUNTER — Other Ambulatory Visit: Payer: Self-pay | Admitting: Cardiovascular Disease

## 2017-02-03 NOTE — Telephone Encounter (Signed)
Please review for refill, thanks ! 

## 2017-02-05 ENCOUNTER — Ambulatory Visit: Payer: Medicare Other | Admitting: Nurse Practitioner

## 2017-02-05 ENCOUNTER — Telehealth: Payer: Self-pay | Admitting: Cardiovascular Disease

## 2017-02-05 NOTE — Telephone Encounter (Signed)
Pt reports continued symptoms of red rash and itching mid-abdominal and back area. Symptoms began after starting Eliquis in September; switched to [place] November 20 but sx continue. No hives, no trouble breathing. She has been using cortisone cream with little relief.  Still feels it is r/t medications. Advised pt to continue xarelto and will route to Dr. Fletcher Anon to advise regarding if there is another medication choice.

## 2017-02-05 NOTE — Telephone Encounter (Signed)
Left message on machine for patient to contact the office.   

## 2017-02-05 NOTE — Telephone Encounter (Signed)
The only medication left is Warfarin. Maybe she should go see a dermatologist.

## 2017-02-05 NOTE — Telephone Encounter (Signed)
Pt states she doesn't see the need to see a dermatologist because she didn't start with symptoms until starting on eliquis. Advised only other option is coumadin; pt does not want to take this. States she may call dermatology. I expressed the importance of taking anti-coagulant with afib and to not miss any doses. Pt verbalized understanding and will try and "tough it out".

## 2017-02-05 NOTE — Telephone Encounter (Signed)
Pt states since she has started taking Xarelto, she has been breaking out and itching. Please call.

## 2017-02-13 DIAGNOSIS — H02402 Unspecified ptosis of left eyelid: Secondary | ICD-10-CM | POA: Diagnosis not present

## 2017-03-12 ENCOUNTER — Other Ambulatory Visit: Payer: Self-pay | Admitting: Internal Medicine

## 2017-03-12 MED ORDER — MAGNESIUM OXIDE 400 MG PO TABS: 400.0000 mg | ORAL_TABLET | Freq: Every day | ORAL | 1 refills | Status: DC

## 2017-03-12 MED ORDER — PANTOPRAZOLE SODIUM 40 MG PO TBEC: 40.0000 mg | DELAYED_RELEASE_TABLET | Freq: Every day | ORAL | 1 refills | Status: DC

## 2017-03-12 NOTE — Telephone Encounter (Signed)
Copied from [place] [id number]. Topic: Quick Communication - See Telephone Encounter >> Mar 12, 2017  3:38 PM Conception Chancy, NT wrote: CRM for notification. See Telephone encounter for:  03/12/17.  Patient is requesting a refill on 2 medications magnesium-oxide: pantoprazole. And states the pharmacy is telling her that Dr. Nicki Reaper is denying the refills. Patient would like to know why they can not be refilled. Please advise  [facility] pharmacy on file

## 2017-03-12 NOTE — Telephone Encounter (Signed)
Scripts sent to [facility]

## 2017-03-13 DIAGNOSIS — H401131 Primary open-angle glaucoma, bilateral, mild stage: Secondary | ICD-10-CM | POA: Diagnosis not present

## 2017-04-02 ENCOUNTER — Other Ambulatory Visit: Payer: Self-pay | Admitting: Internal Medicine

## 2017-04-21 ENCOUNTER — Ambulatory Visit (INDEPENDENT_AMBULATORY_CARE_PROVIDER_SITE_OTHER): Payer: Medicare Other

## 2017-04-21 VITALS — BP 126/82 | HR 77 | Temp 98.4°F | Resp 12 | Ht 61.5 in | Wt 135.8 lb

## 2017-04-21 DIAGNOSIS — Z0000 Encounter for general adult medical examination without abnormal findings: Secondary | ICD-10-CM | POA: Diagnosis not present

## 2017-04-21 NOTE — Progress Notes (Signed)
Subjective:   Ruth Roth is a 84 y.o. female who presents for Medicare Annual (Subsequent) preventive examination.  Review of Systems:  No ROS.  Medicare Wellness Visit. Additional risk factors are reflected in the social history.  Cardiac Risk Factors include: advanced age (>68men, >16 women);hypertension     Objective:     Vitals: BP 126/82 (BP Location: Left Arm, Patient Position: Sitting, Cuff Size: Normal)   Pulse 77   Temp 98.4 F (36.9 C) (Oral)   Resp 12   Ht 5' 1.5" (1.562 m)   Wt 135 lb 12.8 oz (61.6 kg)   SpO2 95%   BMI 25.24 kg/m   Body mass index is 25.24 kg/m.  Advanced Directives 04/21/2017 12/26/2016 11/22/2016 08/08/2016 04/18/2016 04/19/2015  Does Patient Have a Medical Advance Directive? No No No Yes Yes Yes  Type of Advance Directive - - Programmer, [employer] of [place];Living will [place]  Does patient want to make changes to medical advance directive? - - - - No - Patient declined No - Patient declined  Copy of [place] in Chart? - - - - No - copy requested No - copy requested  Would patient like information on creating a medical advance directive? No - Patient declined No - Patient declined - - - -    Tobacco Social History   Tobacco Use  Smoking Status Never Smoker  Smokeless Tobacco Never Used     Counseling given: Not Answered   Clinical Intake:  Pre-visit preparation completed: Yes  Pain : No/denies pain     Nutritional Status: BMI of 19-24  Normal Diabetes: No  How often do you need to have someone help you when you read instructions, pamphlets, or other written materials from your doctor or pharmacy?: 1 - Never  Interpreter Needed?: No     Past Medical History:  Diagnosis Date  . Autoimmune hepatitis ([place])    followed by Dr [name]  . CHF (congestive heart failure) ([place])   . Coronary artery disease    Non-ST elevation myocardial infarction in March 2016.  Cardiac catheterization showed 60% ostial left circumflex hazy stenosis which was possibly the culprit. Mild LAD/RCA disease. EF 60% by echo.  . Fibrocystic breast disease   . Hypercholesterolemia   . Hypertension   . Inflammatory arthritis   . MI (myocardial infarction) ([place])   . Neuropathy   . Osteoarthritis   . 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 Thyroid disease    Past Surgical History:  Procedure Laterality Date  . ABDOMINAL HYSTERECTOMY  1963   partial, secondary to fibroids  . APPENDECTOMY    . Billroth    . CARDIAC CATHETERIZATION  05/12/14   [facility]  . CARDIOVERSION N/A 12/26/2016   Procedure: CARDIOVERSION;  Surgeon: [name], MD;  Location: [facility] ORS;  Service: Cardiovascular;  Laterality: N/A;  . CHOLECYSTECTOMY  1998  . COLONOSCOPY  2009  . UPPER GI ENDOSCOPY  2004   Family History  Problem Relation Age of Onset  . Stroke Mother   . Esophageal cancer Brother        also had lung cancer  . Cancer Daughter        colon  . Ovarian cancer Daughter   . Breast cancer Neg Hx   . Colon cancer Neg Hx    Social History   Socioeconomic History  . Marital status: Widowed  Spouse name: None  . Number of children: None  . Years of education: None  . Highest education level: None  Social Needs  . Financial resource strain: None  . Food insecurity - worry: None  . Food insecurity - inability: None  . Transportation needs - medical: None  . Transportation needs - non-medical: None  Occupational History  . None  Tobacco Use  . Smoking status: Never Smoker  . Smokeless tobacco: Never Used  Substance and Sexual Activity  . Alcohol use: No    Alcohol/week: 0.0 oz  . Drug use: No  . Sexual activity: No  Other Topics Concern  . None  Social History Narrative  . None    Outpatient Encounter Medications as of 04/21/2017  Medication Sig  . amLODipine (NORVASC) 5 MG tablet Take 5 mg by mouth at  bedtime.   . brimonidine-timolol (COMBIGAN) 0.2-0.5 % ophthalmic solution Place 1 drop into the left eye 2 (two) times daily.   . dorzolamide (TRUSOPT) 2 % ophthalmic solution Place 1 drop into the left eye 2 (two) times daily.  . fluticasone (FLONASE) 50 MCG/ACT nasal spray Place 1-2 sprays into both nostrils daily as needed (for allergies.).   Marland Kitchen furosemide (LASIX) 20 MG tablet Take 1 tablet (20 mg total) by mouth every other day.  . gabapentin (NEURONTIN) 300 MG capsule TAKE 2 CAPSULES BY MOUTH FOUR TIMES DAILY  . Hydrocortisone ([id number]-64 EX) Apply 1 application topically 4 (four) times daily as needed (for rash.).  Marland Kitchen ipratropium (ATROVENT) 0.06 % nasal spray Place 1 spray into both nostrils 3 (three) times daily as needed (for allergies.).   Marland Kitchen latanoprost (XALATAN) 0.005 % ophthalmic solution Place 1 drop into the left eye at bedtime.   Marland Kitchen levothyroxine (SYNTHROID, LEVOTHROID) 112 MCG tablet TAKE 1 TABLET BY MOUTH EVERY DAY BEFORE BREAKFAST  . magnesium oxide (MAG-OX) 400 MG tablet Take 1 tablet (400 mg total) by mouth daily.  . pantoprazole (PROTONIX) 40 MG tablet Take 1 tablet (40 mg total) by mouth daily.  . rivaroxaban (XARELTO) 20 MG TABS tablet Take 1 tablet (20 mg total) by mouth daily with supper.  . traMADol (ULTRAM) 50 MG tablet Take 1 tablet (50 mg total) 2 (two) times daily as needed by mouth (for pain.).  Marland Kitchen ursodiol (ACTIGALL) 300 MG capsule Take 300-600 mg by mouth 2 (two) times daily. Take 2 capsules (600 mg) daily after lunch & 1 capsule (300 mg) at night.  . metoprolol tartrate (LOPRESSOR) 100 MG tablet Take 0.5 tablets (50 mg total) by mouth 2 (two) times daily.   No facility-administered encounter medications on file as of 04/21/2017.     Activities of Daily Living In your present state of health, do you have any difficulty performing the following activities: 04/21/2017  Hearing? N  Vision? N  Difficulty concentrating or making decisions? N  Walking or climbing  stairs? Y  Comment  Intermttent hip pain, bilateral  Dressing or bathing? N  Doing errands, shopping? N  Preparing Food and eating ? N  Using the Toilet? N  In the past six months, have you accidently leaked urine? N  Do you have problems with loss of bowel control? N  Managing your Medications? N  Managing your Finances? N  Housekeeping or managing your Housekeeping? N  Some recent data might be hidden    Patient Care Team: Einar Pheasant, MD as PCP - General (Unknown Physician Specialty) Robert Bellow, MD (General Surgery)    Assessment:   This  is a routine wellness examination for Ruth Roth.  The goal of the wellness visit is to assist the patient how to close the gaps in care and create a preventative care plan for the patient.   The roster of all physicians providing medical care to patient is listed in the Snapshot section of the chart.  Taking calcium VIT D as appropriate/Osteoporosis reviewed.   Not currently taking injections.  Safety issues reviewed; Smoke and carbon monoxide detectors in the home. No firearms or firearms locked in a safe within the home. Wears seatbelts when driving or riding with others. No violence in the home.  They do not have excessive sun exposure.  Discussed the need for sun protection: hats, long sleeves and the use of sunscreen if there is significant sun exposure.  Patient is alert, normal appearance, oriented to person/place/and time.  Correctly identified the president of the [place] and recalls of 3/3 words. Performs simple calculations and can read correct time from watch face.  Displays appropriate judgement.  No new identified risk were noted.  No failures at ADL's or IADL's.    BMI- discussed the importance of a healthy diet, water intake and the benefits of aerobic exercise. She has a healthy diet, adequate water intake and plans to increase her physical activity.     24 hour diet recall: Regular diet, twice daily, portion  controlled.  Dental- every 6 months.  Eye- Visual acuity not assessed per patient preference since they have regular follow up with the ophthalmologist.  Wears corrective lenses.  Sleep patterns- Sleeps 6 hours at night.  Health maintenance gaps- closed.  Patient Concerns: None at this time. Follow up with PCP as needed.  Exercise Activities and Dietary recommendations Current Exercise Habits: The patient does not participate in regular exercise at present  Goals    . Increase physical activity     Exercise as tolerated       Fall Risk Fall Risk  04/21/2017 04/18/2016 10/23/2015 04/19/2015 09/30/2014  Falls in the past year? No Yes Yes No No  Number falls in past yr: - 1 2 or more - -  Injury with Fall? - Yes Yes - -  Follow up - Falls prevention discussed - - -    Depression Screen PHQ 2/9 Scores 04/21/2017 07/30/2016 04/18/2016 10/23/2015  PHQ - 2 Score 0 0 0 0  PHQ- 9 Score - 0 - -     Cognitive Function MMSE - Mini Mental State Exam 04/19/2015  Orientation to time 5  Orientation to Place 5  Registration 3  Attention/ Calculation 5  Recall 3  Language- name 2 objects 2  Language- repeat 1  Language- follow 3 step command 3  Language- read & follow direction 1  Write a sentence 1  Copy design 1  Total score 30     6CIT Screen 04/21/2017 04/18/2016  What Year? 0 points 0 points  What month? 0 points 0 points  What time? 0 points 0 points  Count back from 20 0 points 0 points  Months in reverse 0 points 0 points  Repeat phrase 0 points 0 points  Total Score 0 0    Immunization History  Administered Date(s) Administered  . Influenza, High Dose Seasonal PF 01/23/2016, 01/21/2017  . Influenza,inj,Quad PF,6+ Mos 12/24/2012, 01/11/2014, 01/16/2015  . Influenza-Unspecified 12/10/2011  . Pneumococcal Conjugate-13 02/22/2014  . Pneumococcal Polysaccharide-23 07/30/2016  . Td 08/17/2010   Screening Tests Health Maintenance  Topic Date Due  . MAMMOGRAM  06/17/2017  .  TETANUS/TDAP  08/16/2020  . INFLUENZA VACCINE  Completed  . DEXA SCAN  Completed  . PNA vac Low Risk Adult  Completed       Plan:    End of life planning; Advance aging; Advanced directives discussed. Copy of current HCPOA/Living Will on file.    I have personally reviewed and noted the following in the patient's chart:   . Medical and social history . Use of alcohol, tobacco or illicit drugs  . Current medications and supplements . Functional ability and status . Nutritional status . Physical activity . Advanced directives . List of other physicians . Hospitalizations, surgeries, and ER visits in previous 12 months . Vitals . Screenings to include cognitive, depression, and falls . Referrals and appointments  In addition, I have reviewed and discussed with patient certain preventive protocols, quality metrics, and best practice recommendations. A written personalized care plan for preventive services as well as general preventive health recommendations were provided to patient.    OBrien-Blaney, Bitania Shankland L, LPN  11/06/9369    I have reviewed the above information and agree with above.   Deborra Medina, MD

## 2017-04-21 NOTE — Patient Instructions (Addendum)
  Ruth Roth , Thank you for taking time to come for your Medicare Wellness Visit. I appreciate your ongoing commitment to your health goals. Please review the following plan we discussed and let me know if I can assist you in the future.   Follow up with Dr. Nicki Reaper as needed.    Have a great day!  These are the goals we discussed: Goals    . Increase physical activity     Exercise as tolerated       This is a list of the screening recommended for you and due dates:  Health Maintenance  Topic Date Due  . Mammogram  06/17/2017  . Tetanus Vaccine  08/16/2020  . Flu Shot  Completed  . DEXA scan (bone density measurement)  Completed  . Pneumonia vaccines  Completed

## 2017-04-23 ENCOUNTER — Other Ambulatory Visit: Payer: Self-pay | Admitting: Gastroenterology

## 2017-04-23 DIAGNOSIS — K743 Primary biliary cirrhosis: Secondary | ICD-10-CM

## 2017-04-27 NOTE — Progress Notes (Signed)
  I have reviewed the above information and agree with above.   Teresa Tullo, MD 

## 2017-05-01 DIAGNOSIS — J019 Acute sinusitis, unspecified: Secondary | ICD-10-CM | POA: Diagnosis not present

## 2017-05-02 ENCOUNTER — Encounter: Payer: Self-pay | Admitting: Cardiovascular Disease

## 2017-05-02 ENCOUNTER — Other Ambulatory Visit: Payer: Self-pay | Admitting: Cardiovascular Disease

## 2017-05-02 ENCOUNTER — Ambulatory Visit (INDEPENDENT_AMBULATORY_CARE_PROVIDER_SITE_OTHER): Payer: Medicare Other | Admitting: Cardiovascular Disease

## 2017-05-02 ENCOUNTER — Telehealth: Payer: Self-pay | Admitting: Cardiovascular Disease

## 2017-05-02 VITALS — BP 140/70 | HR 88 | Ht 61.5 in | Wt 133.8 lb

## 2017-05-02 DIAGNOSIS — E785 Hyperlipidemia, unspecified: Secondary | ICD-10-CM | POA: Diagnosis not present

## 2017-05-02 DIAGNOSIS — I10 Essential (primary) hypertension: Secondary | ICD-10-CM | POA: Diagnosis not present

## 2017-05-02 DIAGNOSIS — I2510 Atherosclerotic heart disease of native coronary artery without angina pectoris: Secondary | ICD-10-CM | POA: Diagnosis not present

## 2017-05-02 DIAGNOSIS — I481 Persistent atrial fibrillation: Secondary | ICD-10-CM | POA: Diagnosis not present

## 2017-05-02 DIAGNOSIS — Z01812 Encounter for preprocedural laboratory examination: Secondary | ICD-10-CM

## 2017-05-02 DIAGNOSIS — I4819 Other persistent atrial fibrillation: Secondary | ICD-10-CM

## 2017-05-02 MED ORDER — AMIODARONE HCL 200 MG PO TABS: ORAL_TABLET | ORAL | 0 refills | Status: DC

## 2017-05-02 NOTE — Progress Notes (Signed)
Cardiology Office Note   Date:  05/02/2017   ID:  COTY STUDENT, DOB 10/24/1928, MRN [id number]  PCP:  Einar Pheasant, MD  Cardiologist:   [name], MD   Chief Complaint  Patient presents with  . other    3 month follow up. Meds reviewed by the pt. verbally. Pt. c/o shortness of breath with little activity.       History of Present Illness: Ruth Roth is a 84 y.o. female who presents for a follow-up visit regarding coronary artery disease ,chronic diastolic heart failure and persistent atrial fibrillation .  She has known history of hypertension, peptic ulcer disease, fibrocystic breast disease, inflammatory arthritis, autoimmune hepatitis and neuropathy.  She had a small non-ST elevation myocardial infarction in March 2016 with acute diastolic heart failure after a GI illness.  Echocardiogram showed normal LV systolic function. Cardiac catheterization showed showed 60% ostial left circumflex hazy stenosis which was possibly the culprit. Mild LAD/RCA disease. She was treated medically.   She underwent successful cardioversion in October to sinus rhythm and had significant improvement in her symptoms.  However, around Christmas time, she felt worse again with recurrent palpitations and shortness of breath.  She is noted to be in atrial fibrillation today.  She is having hard time sleeping at night due to palpitations and reports dyspnea with minimal activities. She also has been under significant stress given that her daughter is dealing with terminal cancer.  Past Medical History:  Diagnosis Date  . Autoimmune hepatitis ([place])    followed by Dr [name]  . CHF (congestive heart failure) ([place])   . Coronary artery disease    Non-ST elevation myocardial infarction in March 2016. Cardiac catheterization showed 60% ostial left circumflex hazy stenosis which was possibly the culprit. Mild LAD/RCA disease. EF 60% by echo.  . Fibrocystic breast disease   . Hypercholesterolemia   .  Hypertension   . Inflammatory arthritis   . MI (myocardial infarction) ([place])   . Neuropathy   . Osteoarthritis   . 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 Thyroid disease     Past Surgical History:  Procedure Laterality Date  . ABDOMINAL HYSTERECTOMY  1963   partial, secondary to fibroids  . APPENDECTOMY    . Billroth    . CARDIAC CATHETERIZATION  05/12/14   [facility]  . CARDIOVERSION N/A 12/26/2016   Procedure: CARDIOVERSION;  Surgeon: [name], MD;  Location: [facility] ORS;  Service: Cardiovascular;  Laterality: N/A;  . CHOLECYSTECTOMY  1998  . COLONOSCOPY  2009  . UPPER GI ENDOSCOPY  2004     Current Outpatient Medications  Medication Sig Dispense Refill  . amLODipine (NORVASC) 5 MG tablet Take 5 mg by mouth at bedtime.     . brimonidine-timolol (COMBIGAN) 0.2-0.5 % ophthalmic solution Place 1 drop into the left eye 2 (two) times daily.     . dorzolamide (TRUSOPT) 2 % ophthalmic solution Place 1 drop into the left eye 2 (two) times daily.    . fluticasone (FLONASE) 50 MCG/ACT nasal spray Place 1-2 sprays into both nostrils daily as needed (for allergies.).     Marland Kitchen furosemide (LASIX) 20 MG tablet Take 1 tablet (20 mg total) by mouth every other day. 90 tablet 1  . gabapentin (NEURONTIN) 300 MG capsule TAKE 2 CAPSULES BY MOUTH FOUR TIMES DAILY 720 capsule 0  . Hydrocortisone (YCXKGYJEH-63 EX) Apply 1 application topically 4 (four) times daily as  needed (for rash.).    Marland Kitchen ipratropium (ATROVENT) 0.06 % nasal spray Place 1 spray into both nostrils 3 (three) times daily as needed (for allergies.).     Marland Kitchen latanoprost (XALATAN) 0.005 % ophthalmic solution Place 1 drop into the left eye at bedtime.     Marland Kitchen levothyroxine (SYNTHROID, LEVOTHROID) 112 MCG tablet TAKE 1 TABLET BY MOUTH EVERY DAY BEFORE BREAKFAST 90 tablet 11  . magnesium oxide (MAG-OX) 400 MG tablet Take 1 tablet (400 mg total) by mouth daily. 90 tablet 1  . pantoprazole  (PROTONIX) 40 MG tablet Take 1 tablet (40 mg total) by mouth daily. 90 tablet 1  . rivaroxaban (XARELTO) 20 MG TABS tablet Take 1 tablet (20 mg total) by mouth daily with supper. 30 tablet 5  . traMADol (ULTRAM) 50 MG tablet Take 1 tablet (50 mg total) 2 (two) times daily as needed by mouth (for pain.). 60 tablet 0  . ursodiol (ACTIGALL) 300 MG capsule Take 300-600 mg by mouth 2 (two) times daily. Take 2 capsules (600 mg) daily after lunch & 1 capsule (300 mg) at night.    . metoprolol tartrate (LOPRESSOR) 100 MG tablet Take 0.5 tablets (50 mg total) by mouth 2 (two) times daily. 180 tablet 3   No current facility-administered medications for this visit.     Allergies:   Statins; Haldol [haloperidol lactate]; Librax [chlordiazepoxide-clidinium]; Plavix [clopidogrel bisulfate]; and Ramipril    Social History:  The patient  reports that  has never smoked. she has never used smokeless tobacco. She reports that she does not drink alcohol or use drugs.   Family History:  The patient's family history includes Cancer in her daughter; Esophageal cancer in her brother; Ovarian cancer in her daughter; Stroke in her mother.    ROS:  Please see the history of present illness.   Otherwise, review of systems are positive for none.   All other systems are reviewed and negative.    PHYSICAL EXAM: VS:  BP 140/70 (BP Location: Left Arm, Patient Position: Sitting, Cuff Size: Normal)   Pulse 88   Ht 5' 1.5" (1.562 m)   Wt 133 lb 12 oz (60.7 kg)   BMI 24.86 kg/m  , BMI Body mass index is 24.86 kg/m. GEN: Well nourished, well developed, in no acute distress  HEENT: normal  Neck: no JVD, carotid bruits, or masses Cardiac: Irregularly irregular; no rubs, or gallops,no edema . 1/ 6 systolic ejection murmur at the aortic area Respiratory:  clear to auscultation bilaterally, normal work of breathing GI: soft, nontender, nondistended, + BS MS: no deformity or atrophy  Skin: warm and dry, no rash Neuro:   Strength and sensation are intact Psych: euthymic mood, full affect   EKG:  EKG is ordered today. The ekg ordered today demonstrates atrial fibrillation with nonspecific ST changes.   Recent Labs: 08/14/2016: TSH 1.96 12/16/2016: Hemoglobin 12.2; Platelets 218 01/17/2017: ALT 19; BUN 17; Creatinine, Ser 0.81; Potassium 4.4; Sodium 138    Lipid Panel    Component Value Date/Time   CHOL 199 01/17/2017 1054   TRIG 107.0 01/17/2017 1054   HDL 67.30 01/17/2017 1054   CHOLHDL 3 01/17/2017 1054   VLDL 21.4 01/17/2017 1054   LDLCALC 110 (H) 01/17/2017 1054   LDLDIRECT 143.5 12/17/2012 0955      Wt Readings from Last 3 Encounters:  05/02/17 133 lb 12 oz (60.7 kg)  04/21/17 135 lb 12.8 oz (61.6 kg)  01/28/17 133 lb 8 oz (60.6 kg)  ASSESSMENT AND PLAN:  1.  Persistent atrial fibrillation:   She is back in atrial fibrillation and seems to be very symptomatic with this.  I discussed different management options including continued rate control versus rhythm control strategy.  In order to keep her in sinus rhythm, she would require an antiarrhythmic medication.  I think the best option is amiodarone which was started today at 400 mg twice daily for 5 days followed by 200 mg twice daily.  Continue same dose of metoprolol.  I recommend proceeding with repeat cardioversion in 2 weeks from now.  Continue anticoagulation with Xarelto.  She has not missed any dose.   2. Coronary artery disease involving native coronary arteries without angina:  Continue medical therapy.  3. Chronic diastolic heart failure: She appears to be euvolemic.  4. Essential hypertension: Blood pressure is controlled on current medications.  5. Hyperlipidemia: Not on a statin due to history of autoimmune hepatitis. Most recent LDL was 138.  6.  Mitral regurgitation this was in the setting of A. fib with RVR.  Continue to monitor clinically..   Disposition:   FU with me in few weeks after  cardioversion.  Signed,  [name], MD  05/02/2017 2:12 PM     [facility]

## 2017-05-02 NOTE — H&P (View-Only) (Signed)
Cardiology Office Note   Date:  05/02/2017   ID:  Ruth Roth, DOB November 05, 1928, MRN [id number]  PCP:  Einar Pheasant, MD  Cardiologist:   [name], MD   Chief Complaint  Patient presents with  . other    3 month follow up. Meds reviewed by the pt. verbally. Pt. c/o shortness of breath with little activity.       History of Present Illness: Ruth Roth is a 84 y.o. female who presents for a follow-up visit regarding coronary artery disease ,chronic diastolic heart failure and persistent atrial fibrillation .  She has known history of hypertension, peptic ulcer disease, fibrocystic breast disease, inflammatory arthritis, autoimmune hepatitis and neuropathy.  She had a small non-ST elevation myocardial infarction in March 2016 with acute diastolic heart failure after a GI illness.  Echocardiogram showed normal LV systolic function. Cardiac catheterization showed showed 60% ostial left circumflex hazy stenosis which was possibly the culprit. Mild LAD/RCA disease. She was treated medically.   She underwent successful cardioversion in October to sinus rhythm and had significant improvement in her symptoms.  However, around Christmas time, she felt worse again with recurrent palpitations and shortness of breath.  She is noted to be in atrial fibrillation today.  She is having hard time sleeping at night due to palpitations and reports dyspnea with minimal activities. She also has been under significant stress given that her daughter is dealing with terminal cancer.  Past Medical History:  Diagnosis Date  . Autoimmune hepatitis ([place])    followed by Dr [name]  . CHF (congestive heart failure) ([place])   . Coronary artery disease    Non-ST elevation myocardial infarction in March 2016. Cardiac catheterization showed 60% ostial left circumflex hazy stenosis which was possibly the culprit. Mild LAD/RCA disease. EF 60% by echo.  . Fibrocystic breast disease   . Hypercholesterolemia   .  Hypertension   . Inflammatory arthritis   . MI (myocardial infarction) ([place])   . Neuropathy   . Osteoarthritis   . 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 Thyroid disease     Past Surgical History:  Procedure Laterality Date  . ABDOMINAL HYSTERECTOMY  1963   partial, secondary to fibroids  . APPENDECTOMY    . Billroth    . CARDIAC CATHETERIZATION  05/12/14   [facility]  . CARDIOVERSION N/A 12/26/2016   Procedure: CARDIOVERSION;  Surgeon: [name], MD;  Location: [facility] ORS;  Service: Cardiovascular;  Laterality: N/A;  . CHOLECYSTECTOMY  1998  . COLONOSCOPY  2009  . UPPER GI ENDOSCOPY  2004     Current Outpatient Medications  Medication Sig Dispense Refill  . amLODipine (NORVASC) 5 MG tablet Take 5 mg by mouth at bedtime.     . brimonidine-timolol (COMBIGAN) 0.2-0.5 % ophthalmic solution Place 1 drop into the left eye 2 (two) times daily.     . dorzolamide (TRUSOPT) 2 % ophthalmic solution Place 1 drop into the left eye 2 (two) times daily.    . fluticasone (FLONASE) 50 MCG/ACT nasal spray Place 1-2 sprays into both nostrils daily as needed (for allergies.).     Marland Kitchen furosemide (LASIX) 20 MG tablet Take 1 tablet (20 mg total) by mouth every other day. 90 tablet 1  . gabapentin (NEURONTIN) 300 MG capsule TAKE 2 CAPSULES BY MOUTH FOUR TIMES DAILY 720 capsule 0  . Hydrocortisone (XNATFTDDU-20 EX) Apply 1 application topically 4 (four) times daily as  needed (for rash.).    Marland Kitchen ipratropium (ATROVENT) 0.06 % nasal spray Place 1 spray into both nostrils 3 (three) times daily as needed (for allergies.).     Marland Kitchen latanoprost (XALATAN) 0.005 % ophthalmic solution Place 1 drop into the left eye at bedtime.     Marland Kitchen levothyroxine (SYNTHROID, LEVOTHROID) 112 MCG tablet TAKE 1 TABLET BY MOUTH EVERY DAY BEFORE BREAKFAST 90 tablet 11  . magnesium oxide (MAG-OX) 400 MG tablet Take 1 tablet (400 mg total) by mouth daily. 90 tablet 1  . pantoprazole  (PROTONIX) 40 MG tablet Take 1 tablet (40 mg total) by mouth daily. 90 tablet 1  . rivaroxaban (XARELTO) 20 MG TABS tablet Take 1 tablet (20 mg total) by mouth daily with supper. 30 tablet 5  . traMADol (ULTRAM) 50 MG tablet Take 1 tablet (50 mg total) 2 (two) times daily as needed by mouth (for pain.). 60 tablet 0  . ursodiol (ACTIGALL) 300 MG capsule Take 300-600 mg by mouth 2 (two) times daily. Take 2 capsules (600 mg) daily after lunch & 1 capsule (300 mg) at night.    . metoprolol tartrate (LOPRESSOR) 100 MG tablet Take 0.5 tablets (50 mg total) by mouth 2 (two) times daily. 180 tablet 3   No current facility-administered medications for this visit.     Allergies:   Statins; Haldol [haloperidol lactate]; Librax [chlordiazepoxide-clidinium]; Plavix [clopidogrel bisulfate]; and Ramipril    Social History:  The patient  reports that  has never smoked. she has never used smokeless tobacco. She reports that she does not drink alcohol or use drugs.   Family History:  The patient's family history includes Cancer in her daughter; Esophageal cancer in her brother; Ovarian cancer in her daughter; Stroke in her mother.    ROS:  Please see the history of present illness.   Otherwise, review of systems are positive for none.   All other systems are reviewed and negative.    PHYSICAL EXAM: VS:  BP 140/70 (BP Location: Left Arm, Patient Position: Sitting, Cuff Size: Normal)   Pulse 88   Ht 5' 1.5" (1.562 m)   Wt 133 lb 12 oz (60.7 kg)   BMI 24.86 kg/m  , BMI Body mass index is 24.86 kg/m. GEN: Well nourished, well developed, in no acute distress  HEENT: normal  Neck: no JVD, carotid bruits, or masses Cardiac: Irregularly irregular; no rubs, or gallops,no edema . 1/ 6 systolic ejection murmur at the aortic area Respiratory:  clear to auscultation bilaterally, normal work of breathing GI: soft, nontender, nondistended, + BS MS: no deformity or atrophy  Skin: warm and dry, no rash Neuro:   Strength and sensation are intact Psych: euthymic mood, full affect   EKG:  EKG is ordered today. The ekg ordered today demonstrates atrial fibrillation with nonspecific ST changes.   Recent Labs: 08/14/2016: TSH 1.96 12/16/2016: Hemoglobin 12.2; Platelets 218 01/17/2017: ALT 19; BUN 17; Creatinine, Ser 0.81; Potassium 4.4; Sodium 138    Lipid Panel    Component Value Date/Time   CHOL 199 01/17/2017 1054   TRIG 107.0 01/17/2017 1054   HDL 67.30 01/17/2017 1054   CHOLHDL 3 01/17/2017 1054   VLDL 21.4 01/17/2017 1054   LDLCALC 110 (H) 01/17/2017 1054   LDLDIRECT 143.5 12/17/2012 0955      Wt Readings from Last 3 Encounters:  05/02/17 133 lb 12 oz (60.7 kg)  04/21/17 135 lb 12.8 oz (61.6 kg)  01/28/17 133 lb 8 oz (60.6 kg)  ASSESSMENT AND PLAN:  1.  Persistent atrial fibrillation:   She is back in atrial fibrillation and seems to be very symptomatic with this.  I discussed different management options including continued rate control versus rhythm control strategy.  In order to keep her in sinus rhythm, she would require an antiarrhythmic medication.  I think the best option is amiodarone which was started today at 400 mg twice daily for 5 days followed by 200 mg twice daily.  Continue same dose of metoprolol.  I recommend proceeding with repeat cardioversion in 2 weeks from now.  Continue anticoagulation with Xarelto.  She has not missed any dose.   2. Coronary artery disease involving native coronary arteries without angina:  Continue medical therapy.  3. Chronic diastolic heart failure: She appears to be euvolemic.  4. Essential hypertension: Blood pressure is controlled on current medications.  5. Hyperlipidemia: Not on a statin due to history of autoimmune hepatitis. Most recent LDL was 138.  6.  Mitral regurgitation this was in the setting of A. fib with RVR.  Continue to monitor clinically..   Disposition:   FU with me in few weeks after  cardioversion.  Signed,  [name], MD  05/02/2017 2:12 PM    [facility]

## 2017-05-02 NOTE — Patient Instructions (Addendum)
Medication Instructions: - Your physician has recommended you make the following change in your medication:   - START amiodarone: 1) amiodarone 200 mg- take 2 tablets (400 mg) by mouth TWICE daily x 5 days, 2) then decrease amiodarone 200 mg- take 1 tablet (200 mg) by mouth TWICE daily until your cardioversion  ( you will most likely be able to decrease amiodarone to 200 mg once daily after your cardioversion)  Labwork: - Your physician recommends that you have lab work today: BMP/ CBC   Procedures/Testing: - Your physician has recommended that you have a Cardioversion (DCCV). Electrical Cardioversion uses a jolt of electricity to your heart either through paddles or wired patches attached to your chest. This is a controlled, usually prescheduled, procedure. Defibrillation is done under light anesthesia in the hospital, and you usually go home the day of the procedure. This is done to get your heart back into a normal rhythm. You are not awake for the procedure. Please see the instruction sheet given to you today.  You are scheduled for a Cardioversion on ________________ with Dr.___________ Please arrive at the [place] [facility] at _________ a.m. on the day of your procedure.  DIET INSTRUCTIONS:  Nothing to eat or drink after midnight except your medications with a              sip of water.         1) Labs: ____today 2/22________  2) Medications:  Hold your metoprolol & furosemide the morning or your procedure, you may take all of your other medications with enough water to get them down safely.  3) Must have a responsible person to drive you home.  4) Bring a current list of your medications and current insurance cards.    If you have any questions after you get home, please call the office at [phone]   Follow-Up: - Your physician recommends that you schedule a follow-up appointment in: 1 month with Dr. Fletcher Anon   Any Additional Special Instructions Will Be Listed Below  (If Applicable).     If you need a refill on your cardiac medications before your next appointment, please call your pharmacy.

## 2017-05-02 NOTE — Telephone Encounter (Signed)
I called the patient to verify that her DCCV has been scheduled for 05/16/17 with Dr. Fletcher Anon.  She is aware to arrive at 6:30 am for a 7:30 am procedure.  She voices understanding.

## 2017-05-03 LAB — BASIC METABOLIC PANEL
BUN / CREAT RATIO: 24 (ref 12–28)
BUN: 21 mg/dL (ref 8–27)
CHLORIDE: 110 mmol/L — AB (ref 96–106)
CO2: 18 mmol/L — ABNORMAL LOW (ref 20–29)
Calcium: 8.7 mg/dL (ref 8.7–10.3)
Creatinine, Ser: 0.89 mg/dL (ref 0.57–1.00)
GFR calc Af Amer: 68 mL/min/{1.73_m2} (ref >59)
GFR calc non Af Amer: 59 mL/min/{1.73_m2} — ABNORMAL LOW (ref >59)
GLUCOSE: 51 mg/dL — AB (ref 65–99)
POTASSIUM: 4.4 mmol/L (ref 3.5–5.2)
SODIUM: 140 mmol/L (ref 134–144)

## 2017-05-03 LAB — CBC WITH DIFFERENTIAL/PLATELET
BASOS ABS: 0 10*3/uL (ref 0.0–0.2)
Basos: 0 %
EOS (ABSOLUTE): 0.2 10*3/uL (ref 0.0–0.4)
Eos: 2 %
HEMATOCRIT: 36.6 % (ref 34.0–46.6)
Hemoglobin: 11.6 g/dL (ref 11.1–15.9)
Immature Grans (Abs): 0 10*3/uL (ref 0.0–0.1)
Immature Granulocytes: 0 %
LYMPHS ABS: 1.1 10*3/uL (ref 0.7–3.1)
Lymphs: 10 %
MCH: 31.3 pg (ref 26.6–33.0)
MCHC: 31.7 g/dL (ref 31.5–35.7)
MCV: 99 fL — ABNORMAL HIGH (ref 79–97)
MONOS ABS: 1.2 10*3/uL — AB (ref 0.1–0.9)
Monocytes: 12 %
NEUTROS ABS: 7.8 10*3/uL — AB (ref 1.4–7.0)
Neutrophils: 76 %
Platelets: 227 10*3/uL (ref 150–379)
RBC: 3.71 x10E6/uL — ABNORMAL LOW (ref 3.77–5.28)
RDW: 14.5 % (ref 12.3–15.4)
WBC: 10.4 10*3/uL (ref 3.4–10.8)

## 2017-05-05 ENCOUNTER — Telehealth: Payer: Self-pay | Admitting: Cardiovascular Disease

## 2017-05-05 NOTE — Telephone Encounter (Signed)
Pt calling stating she read upon Amiodarone and she has some concerns  While reading on it, she read it could cause liver problems. She states she already has issues, would like to know if it will hurt it more.  She states she also has thyroid problems and it states it could cause damage to that  She is just wanting a call back for the warnings she is reading most of them she has and would like to know will this worsen  it more.   Please advise.

## 2017-05-06 NOTE — Telephone Encounter (Signed)
Pt called with concerns of side effects listed on amiodarone information pamphlet. Pamphlet notes patients with liver disease and thyroid issues should be cautious with this medication. She started amiodarone  on Feb 22;  DCCV is scheduled March 8.  Patient states she has a history of liver disease -  takes ursodiol; history of thyroid issues. November 2018 hepatic function panel WNL. She would like me to make sure Dr. Fletcher Anon is aware of her concerns. She will continue to take amiodarone but would like to be sure MD is aware of concerns.   Routed to MD.

## 2017-05-07 NOTE — Telephone Encounter (Signed)
Yes, I am a ware of these issues. The plan is do to liver profile and TSH in 1 month to make sure everything is fine. Continue Amiodarone.

## 2017-05-07 NOTE — Telephone Encounter (Signed)
I called the patient- she is aware of Dr. Tyrell Antonio recommendations- I advised her I have already put in her appointment notes that we will draw her liver/ TSH when comes in on 05/26/17. She voices understanding and will continue amiodarone at this time.

## 2017-05-08 ENCOUNTER — Other Ambulatory Visit: Payer: Self-pay

## 2017-05-08 DIAGNOSIS — Z9229 Personal history of other drug therapy: Secondary | ICD-10-CM

## 2017-05-15 ENCOUNTER — Other Ambulatory Visit: Payer: Self-pay | Admitting: Internal Medicine

## 2017-05-15 DIAGNOSIS — Z1231 Encounter for screening mammogram for malignant neoplasm of breast: Secondary | ICD-10-CM

## 2017-05-16 ENCOUNTER — Ambulatory Visit: Payer: Medicare Other | Admitting: Certified Registered Nurse Anesthetist

## 2017-05-16 ENCOUNTER — Ambulatory Visit
Admission: RE | Admit: 2017-05-16 | Discharge: 2017-05-16 | Disposition: A | Discharge status: Home / Self Care | Payer: Medicare Other | Source: Ambulatory Visit | Attending: Cardiovascular Disease | Admitting: Cardiovascular Disease

## 2017-05-16 ENCOUNTER — Encounter: Payer: Self-pay | Admitting: *Deleted

## 2017-05-16 ENCOUNTER — Encounter
Admission: RE | Disposition: A | Discharge status: Home / Self Care | Payer: Self-pay | Source: Ambulatory Visit | Attending: Cardiovascular Disease

## 2017-05-16 DIAGNOSIS — Z8041 Family history of malignant neoplasm of ovary: Secondary | ICD-10-CM | POA: Insufficient documentation

## 2017-05-16 DIAGNOSIS — K219 Gastro-esophageal reflux disease without esophagitis: Secondary | ICD-10-CM | POA: Diagnosis not present

## 2017-05-16 DIAGNOSIS — K279 Peptic ulcer, site unspecified, unspecified as acute or chronic, without hemorrhage or perforation: Secondary | ICD-10-CM | POA: Insufficient documentation

## 2017-05-16 DIAGNOSIS — I252 Old myocardial infarction: Secondary | ICD-10-CM | POA: Diagnosis not present

## 2017-05-16 DIAGNOSIS — I340 Nonrheumatic mitral (valve) insufficiency: Secondary | ICD-10-CM | POA: Insufficient documentation

## 2017-05-16 DIAGNOSIS — Z90710 Acquired absence of both cervix and uterus: Secondary | ICD-10-CM | POA: Diagnosis not present

## 2017-05-16 DIAGNOSIS — M064 Inflammatory polyarthropathy: Secondary | ICD-10-CM | POA: Diagnosis not present

## 2017-05-16 DIAGNOSIS — K754 Autoimmune hepatitis: Secondary | ICD-10-CM | POA: Insufficient documentation

## 2017-05-16 DIAGNOSIS — I110 Hypertensive heart disease with heart failure: Secondary | ICD-10-CM | POA: Insufficient documentation

## 2017-05-16 DIAGNOSIS — I4581 Long QT syndrome: Secondary | ICD-10-CM | POA: Diagnosis not present

## 2017-05-16 DIAGNOSIS — Z888 Allergy status to other drugs, medicaments and biological substances status: Secondary | ICD-10-CM | POA: Insufficient documentation

## 2017-05-16 DIAGNOSIS — I481 Persistent atrial fibrillation: Secondary | ICD-10-CM | POA: Diagnosis not present

## 2017-05-16 DIAGNOSIS — I2510 Atherosclerotic heart disease of native coronary artery without angina pectoris: Secondary | ICD-10-CM | POA: Diagnosis not present

## 2017-05-16 DIAGNOSIS — E7800 Pure hypercholesterolemia, unspecified: Secondary | ICD-10-CM | POA: Diagnosis not present

## 2017-05-16 DIAGNOSIS — Z823 Family history of stroke: Secondary | ICD-10-CM | POA: Diagnosis not present

## 2017-05-16 DIAGNOSIS — N6019 Diffuse cystic mastopathy of unspecified breast: Secondary | ICD-10-CM | POA: Diagnosis not present

## 2017-05-16 DIAGNOSIS — E039 Hypothyroidism, unspecified: Secondary | ICD-10-CM | POA: Diagnosis not present

## 2017-05-16 DIAGNOSIS — E785 Hyperlipidemia, unspecified: Secondary | ICD-10-CM | POA: Diagnosis not present

## 2017-05-16 DIAGNOSIS — N186 End stage renal disease: Secondary | ICD-10-CM | POA: Diagnosis not present

## 2017-05-16 DIAGNOSIS — Z7901 Long term (current) use of anticoagulants: Secondary | ICD-10-CM | POA: Insufficient documentation

## 2017-05-16 DIAGNOSIS — I5032 Chronic diastolic (congestive) heart failure: Secondary | ICD-10-CM | POA: Insufficient documentation

## 2017-05-16 DIAGNOSIS — Z800 Family history of malignant neoplasm of digestive organs: Secondary | ICD-10-CM | POA: Diagnosis not present

## 2017-05-16 DIAGNOSIS — Z79899 Other long term (current) drug therapy: Secondary | ICD-10-CM | POA: Diagnosis not present

## 2017-05-16 DIAGNOSIS — I4891 Unspecified atrial fibrillation: Secondary | ICD-10-CM | POA: Diagnosis not present

## 2017-05-16 DIAGNOSIS — Z9049 Acquired absence of other specified parts of digestive tract: Secondary | ICD-10-CM | POA: Diagnosis not present

## 2017-05-16 DIAGNOSIS — I132 Hypertensive heart and chronic kidney disease with heart failure and with stage 5 chronic kidney disease, or end stage renal disease: Secondary | ICD-10-CM | POA: Diagnosis not present

## 2017-05-16 HISTORY — PX: CARDIOVERSION: EP1203

## 2017-05-16 HISTORY — DX: Unspecified atrial fibrillation: I48.91

## 2017-05-16 SURGERY — CARDIOVERSION (CATH LAB)
Anesthesia: General

## 2017-05-16 MED ORDER — SODIUM CHLORIDE 0.9 % IV SOLN
INTRAVENOUS | Status: DC | PRN
  Administered 2017-05-16: 07:00:00 via INTRAVENOUS

## 2017-05-16 MED ORDER — PROPOFOL 10 MG/ML IV BOLUS
INTRAVENOUS | Status: AC
  Filled 2017-05-16: qty 20

## 2017-05-16 MED ORDER — AMIODARONE HCL 200 MG PO TABS: 200.0000 mg | ORAL_TABLET | Freq: Every day | ORAL | 0 refills | Status: DC

## 2017-05-16 MED ORDER — PROPOFOL 10 MG/ML IV BOLUS
INTRAVENOUS | Status: DC | PRN
  Administered 2017-05-16: 30 mg via INTRAVENOUS

## 2017-05-16 MED ORDER — SUCCINYLCHOLINE CHLORIDE 20 MG/ML IJ SOLN
INTRAMUSCULAR | Status: AC
  Filled 2017-05-16: qty 1

## 2017-05-16 NOTE — Discharge Instructions (Signed)
Electrical Cardioversion, Care After °This sheet gives you information about how to care for yourself after your procedure. Your health care provider may also give you more specific instructions. If you have problems or questions, contact your health care provider. °What can I expect after the procedure? °After the procedure, it is common to have: °· Some redness on the skin where the shocks were given. ° °Follow these instructions at home: °· Do not drive for 24 hours if you were given a medicine to help you relax (sedative). °· Take over-the-counter and prescription medicines only as told by your health care provider. °· Ask your health care provider how to check your pulse. Check it often. °· Rest for 48 hours after the procedure or as told by your health care provider. °· Avoid or limit your caffeine use as told by your health care provider. °Contact a health care provider if: °· You feel like your heart is beating too quickly or your pulse is not regular. °· You have a serious muscle cramp that does not go away. °Get help right away if: °· You have discomfort in your chest. °· You are dizzy or you feel faint. °· You have trouble breathing or you are short of breath. °· Your speech is slurred. °· You have trouble moving an arm or leg on one side of your body. °· Your fingers or toes turn cold or blue. °This information is not intended to replace advice given to you by your health care provider. Make sure you discuss any questions you have with your health care provider. °Document Released: 12/16/2012 Document Revised: 09/29/2015 Document Reviewed: 09/01/2015 °Elsevier Interactive Patient Education © 2018 [employer] ° °

## 2017-05-16 NOTE — Interval H&P Note (Signed)
History and Physical Interval Note:  05/16/2017 7:31 AM  Ruth Roth  has presented today for surgery, with the diagnosis of Cardioversion   Afib    START TIME 7:30a  The various methods of treatment have been discussed with the patient and family. After consideration of risks, benefits and other options for treatment, the patient has consented to  Procedure(s): CARDIOVERSION (N/A) as a surgical intervention .  The patient's history has been reviewed, patient examined, no change in status, stable for surgery.  I have reviewed the patient's chart and labs.  Questions were answered to the patient's satisfaction.     [name]

## 2017-05-16 NOTE — Transfer of Care (Signed)
Immediate Anesthesia Transfer of Care Note  Patient: SHAUNTEL PREST  Procedure(s) Performed: CARDIOVERSION (N/A )  Patient Location: PACU  Anesthesia Type:General  Level of Consciousness: awake, alert  and oriented  Airway & Oxygen Therapy: Patient Spontanous Breathing and Patient connected to nasal cannula oxygen  Post-op Assessment: Report given to RN and Post -op Vital signs reviewed and stable  Post vital signs: Reviewed and stable  Last Vitals:  Vitals:   05/16/17 0646 05/16/17 0743  BP: 119/70 (!) 122/52  Pulse: 84 (!) 50  Resp: 18 18  Temp: 36.8 C   SpO2: 94% 98%    Last Pain: There were no vitals filed for this visit.       Complications: No apparent anesthesia complications

## 2017-05-16 NOTE — CV Procedure (Signed)
Cardioversion note: A standard informed consent was obtained. Timeout was performed. The pads were placed in the anterior posterior fashion. The patient was given propofol by the anesthesia team.  Successful cardioversion was performed with a 200 J. The patient converted to sinus rhythm. Pre-and post EKGs were reviewed. The patient tolerated the procedure with no immediate complications.  Recommendations: Decrease Amiodarone to 200 mg once daily. Follow up in few weeks.

## 2017-05-16 NOTE — Anesthesia Post-op Follow-up Note (Signed)
Anesthesia QCDR form completed.        

## 2017-05-16 NOTE — Anesthesia Postprocedure Evaluation (Signed)
Anesthesia Post Note  Patient: Ruth Roth  Procedure(s) Performed: CARDIOVERSION (N/A )  Patient location during evaluation: Cath Lab Anesthesia Type: General Level of consciousness: awake and alert Pain management: pain level controlled Vital Signs Assessment: post-procedure vital signs reviewed and stable Respiratory status: spontaneous breathing, nonlabored ventilation, respiratory function stable and patient connected to nasal cannula oxygen Cardiovascular status: blood pressure returned to baseline and stable Postop Assessment: no apparent nausea or vomiting Anesthetic complications: no     Last Vitals:  Vitals:   05/16/17 0815 05/16/17 0830  BP: 120/81 133/62  Pulse: (!) 55 (!) 55  Resp: 12 17  Temp:    SpO2: 97% 97%    Last Pain: There were no vitals filed for this visit.               Martha Clan

## 2017-05-16 NOTE — Anesthesia Preprocedure Evaluation (Signed)
Anesthesia Evaluation  Patient identified by MRN, date of birth, ID band Patient awake    Reviewed: Allergy & Precautions, H&P , NPO status , Patient's Chart, lab work & pertinent test results  History of Anesthesia Complications Negative for: history of anesthetic complications  Airway Mallampati: III  TM Distance: <3 FB Neck ROM: limited    Dental  (+) Poor Dentition, Chipped, Missing   Pulmonary neg pulmonary ROS, neg shortness of breath,           Cardiovascular Exercise Tolerance: Good hypertension, (-) angina+ CAD, + Past MI and +CHF  + dysrhythmias Atrial Fibrillation      Neuro/Psych negative neurological ROS  negative psych ROS   GI/Hepatic PUD, GERD  Medicated and Controlled,(+) Hepatitis -  Endo/Other  Hypothyroidism   Renal/GU CRF and ESRFRenal disease  negative genitourinary   Musculoskeletal  (+) Arthritis ,   Abdominal   Peds  Hematology negative hematology ROS (+)   Anesthesia Other Findings Past Medical History: No date: Autoimmune hepatitis ([place])     Comment:  followed by Dr [name] No date: CHF (congestive heart failure) ([place]) No date: Coronary artery disease     Comment:  Non-ST elevation myocardial infarction in March 2016.               Cardiac catheterization showed 60% ostial left circumflex              hazy stenosis which was possibly the culprit. Mild               LAD/RCA disease. EF 60% by echo. No date: Fibrocystic breast disease No date: Hypercholesterolemia No date: Hypertension No date: Inflammatory arthritis No date: MI (myocardial infarction) ([place]) No date: Neuropathy No date: Osteoarthritis No date: PUD (peptic ulcer disease)     Comment:  requiring Billroth II surgery with resulting dumping               syndrome No date: Thyroid disease  Past Surgical History: 1963: ABDOMINAL HYSTERECTOMY     Comment:  partial, secondary to fibroids No date: APPENDECTOMY No  date: Billroth 05/12/14: CARDIAC CATHETERIZATION     Comment:  [facility] 1998: CHOLECYSTECTOMY 2009: COLONOSCOPY 2004: UPPER GI ENDOSCOPY  BMI    Body Mass Index:  24.69 kg/m      Reproductive/Obstetrics negative OB ROS                             Anesthesia Physical  Anesthesia Plan  ASA: III  Anesthesia Plan: General   Post-op Pain Management:    Induction: Intravenous  PONV Risk Score and Plan: 3 and Propofol infusion and Treatment may vary due to age or medical condition  Airway Management Planned: Natural Airway and Nasal Cannula  Additional Equipment:   Intra-op Plan:   Post-operative Plan:   Informed Consent: I have reviewed the patients History and Physical, chart, labs and discussed the procedure including the risks, benefits and alternatives for the proposed anesthesia with the patient or authorized representative who has indicated his/her understanding and acceptance.   Dental Advisory Given  Plan Discussed with: Anesthesiologist, CRNA and Surgeon  Anesthesia Plan Comments: (Patient consented for risks of anesthesia including but not limited to:  - adverse reactions to medications - risk of intubation if required - damage to teeth, lips or other oral mucosa - sore throat or hoarseness - Damage to heart, brain, lungs or loss of life  Patient voiced understanding.)  Anesthesia Quick Evaluation  

## 2017-05-22 DIAGNOSIS — H209 Unspecified iridocyclitis: Secondary | ICD-10-CM | POA: Diagnosis not present

## 2017-05-25 ENCOUNTER — Other Ambulatory Visit: Payer: Self-pay | Admitting: Cardiovascular Disease

## 2017-05-26 ENCOUNTER — Ambulatory Visit (INDEPENDENT_AMBULATORY_CARE_PROVIDER_SITE_OTHER): Payer: Medicare Other | Admitting: Physician Assistant

## 2017-05-26 ENCOUNTER — Encounter: Payer: Self-pay | Admitting: Physician Assistant

## 2017-05-26 VITALS — BP 144/88 | HR 88 | Ht 62.0 in | Wt 133.5 lb

## 2017-05-26 DIAGNOSIS — E785 Hyperlipidemia, unspecified: Secondary | ICD-10-CM | POA: Diagnosis not present

## 2017-05-26 DIAGNOSIS — I481 Persistent atrial fibrillation: Secondary | ICD-10-CM

## 2017-05-26 DIAGNOSIS — I272 Pulmonary hypertension, unspecified: Secondary | ICD-10-CM

## 2017-05-26 DIAGNOSIS — I4819 Other persistent atrial fibrillation: Secondary | ICD-10-CM

## 2017-05-26 DIAGNOSIS — I480 Paroxysmal atrial fibrillation: Secondary | ICD-10-CM

## 2017-05-26 DIAGNOSIS — I2510 Atherosclerotic heart disease of native coronary artery without angina pectoris: Secondary | ICD-10-CM

## 2017-05-26 DIAGNOSIS — I5032 Chronic diastolic (congestive) heart failure: Secondary | ICD-10-CM

## 2017-05-26 DIAGNOSIS — I10 Essential (primary) hypertension: Secondary | ICD-10-CM | POA: Diagnosis not present

## 2017-05-26 DIAGNOSIS — I340 Nonrheumatic mitral (valve) insufficiency: Secondary | ICD-10-CM | POA: Diagnosis not present

## 2017-05-26 MED ORDER — AMIODARONE HCL 200 MG PO TABS: 200.0000 mg | ORAL_TABLET | Freq: Every day | ORAL | 3 refills | Status: DC

## 2017-05-26 MED ORDER — METOPROLOL TARTRATE 75 MG PO TABS: 75.0000 mg | ORAL_TABLET | Freq: Two times a day (BID) | ORAL | 3 refills | Status: DC

## 2017-05-26 NOTE — Progress Notes (Signed)
Cardiology Office Note Date:  05/26/2017  Patient ID:  Ruth Roth, Istre 06-Jan-1929, MRN [id number] PCP:  Einar Pheasant, MD  Cardiologist:  Dr. Fletcher Anon, MD    Chief Complaint: Follow up DCCV  History of Present Illness: Ruth Roth is a 84 y.o. female with history of CAD, chronic diastolic CHF, pulmonary hypertension, persistent Afib on Xarelto s/p DCCV in 12/2016 and again on 05/16/2017, HTN, peptic ulcer disease, fibrocystic breast disease, inflammatory arthritis, autoimmune hepatitis, and neuropathy who presents for follow up of recent DCCV on 05/16/17.   She had a small NSTEMI in 05/2014 with acute diastolic CHF after a GI illness. Echo showed a normal LVSF. Cardiac cath showed 60% ostial LCx hazy stenosis which was felt to possibly be the culprit. There was mild LAD/RCA disease. She was managed medically.  Most recent echocardiogram from 11/2016 showed EF of 50-55%, normal wall motion, not technically sufficient to allow for evaluation of LV diastolic function due to atrial fibrillation, trivial aortic insufficiency, calcified mitral annulus with moderate to severe mitral regurgitation, mildly dilated left atrium, moderate tricuspid regurgitation, moderate pulmonic regurgitation, PAS P 60 mmHg. She previously underwent successful DCCV in 12/2016 with improvement in her symptoms. She noted a return of her SOB and palpitations around 02/2017. She was noted to be in Afib on 05/02/17 with a well controlled ventricular rate.  Labs on 2/22 showed potassium 4.4, WBC 10.4, hemoglobin 11.6, platelet 227, recent TSH normal in 08/2016. She was loaded with amiodarone at that time and underwent planned successful DCCV on 05/16/17.  She comes in today noting intermittent palpitations/"heart fluttering."  She has noted these for the past couple of days.  Symptoms feel similar to her prior A. fib.  She has been compliant with amiodarone 200 mg daily, Lopressor 50 mg twice daily, and Xarelto.  She reports that she took  amiodarone 400 mg twice daily times 5 days followed by 200 mg twice daily for 9 days and has been on 200 mg daily since her cardioversion on 3/8 as above.  She continues to be under significant stress at home with her daughter's terminal illness.  She feels like she is tolerating A. fib reasonably well though does indicate she is more easily fatigued when doing household chores when she is in A. fib.  No dizziness, BRBPR, or melena.  No recent falls.  Most recent liver function normal.   Past Medical History:  Diagnosis Date  . Atrial fibrillation ([place])   . Autoimmune hepatitis ([place])    followed by Dr [name]  . CHF (congestive heart failure) ([place])   . Coronary artery disease    Non-ST elevation myocardial infarction in March 2016. Cardiac catheterization showed 60% ostial left circumflex hazy stenosis which was possibly the culprit. Mild LAD/RCA disease. EF 60% by echo.  . Fibrocystic breast disease   . Hypercholesterolemia   . Hypertension   . Inflammatory arthritis   . MI (myocardial infarction) ([place])   . Neuropathy   . Osteoarthritis   . 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 Thyroid disease     Past Surgical History:  Procedure Laterality Date  . ABDOMINAL HYSTERECTOMY  1963   partial, secondary to fibroids  . APPENDECTOMY    . Billroth    . CARDIAC CATHETERIZATION  05/12/14   [facility]  . CARDIOVERSION N/A 12/26/2016   Procedure: CARDIOVERSION;  Surgeon: [name], MD;  Location: [facility] ORS;  Service: Cardiovascular;  Laterality: N/A;  . CARDIOVERSION N/A 05/16/2017   Procedure: CARDIOVERSION;  Surgeon: [name], MD;  Location: [facility] ORS;  Service: Cardiovascular;  Laterality: N/A;  . CHOLECYSTECTOMY  1998  . COLONOSCOPY  2009  . UPPER GI ENDOSCOPY  2004    Current Meds  Medication Sig  . amiodarone (PACERONE) 200 MG tablet Take 1 tablet (200 mg total) by mouth daily.  Marland Kitchen amLODipine (NORVASC) 5 MG tablet  Take 5 mg by mouth at bedtime.   . brimonidine-timolol (COMBIGAN) 0.2-0.5 % ophthalmic solution Place 1 drop into the left eye 2 (two) times daily.   . dorzolamide (TRUSOPT) 2 % ophthalmic solution Place 1 drop into the left eye 2 (two) times daily.  . fluticasone (FLONASE) 50 MCG/ACT nasal spray Place 1-2 sprays into both nostrils daily as needed (for allergies.).   Marland Kitchen furosemide (LASIX) 20 MG tablet Take 1 tablet (20 mg total) by mouth every other day.  . gabapentin (NEURONTIN) 300 MG capsule TAKE 2 CAPSULES BY MOUTH FOUR TIMES DAILY (Patient taking differently: Take 300 mg by mouth in the morning, take 300 mg by mouth at lunch and take 600 mg by mouth at bedtime)  . Hydrocortisone ([id number]-07 EX) Apply 1 application topically 4 (four) times daily as needed (for rash.).  Marland Kitchen ipratropium (ATROVENT) 0.06 % nasal spray Place 2 sprays into both nostrils daily as needed for rhinitis.   Marland Kitchen latanoprost (XALATAN) 0.005 % ophthalmic solution Place 1 drop into the left eye at bedtime.   Marland Kitchen levothyroxine (SYNTHROID, LEVOTHROID) 112 MCG tablet TAKE 1 TABLET BY MOUTH EVERY DAY BEFORE BREAKFAST (Patient taking differently: Take 112 mcg by mouth daily before breakfast. )  . pantoprazole (PROTONIX) 40 MG tablet Take 1 tablet (40 mg total) by mouth daily.  . rivaroxaban (XARELTO) 20 MG TABS tablet Take 1 tablet (20 mg total) by mouth daily with supper.  . traMADol (ULTRAM) 50 MG tablet Take 1 tablet (50 mg total) 2 (two) times daily as needed by mouth (for pain.).  Marland Kitchen ursodiol (ACTIGALL) 300 MG capsule Take 300-600 mg by mouth See admin instructions. Take 2 capsules (600 mg) daily after lunch & 1 capsule (300 mg) at night.  . [DISCONTINUED] amiodarone (PACERONE) 200 MG tablet Take 1 tablet (200 mg total) by mouth daily. Decrease to 1 tablet daily (Patient taking differently: Take 200 mg by mouth. )    Allergies:   Statins; Haldol [haloperidol lactate]; Librax [chlordiazepoxide-clidinium]; Plavix [clopidogrel  bisulfate]; and Ramipril   Social History:  The patient  reports that  has never smoked. she has never used smokeless tobacco. She reports that she does not drink alcohol or use drugs.   Family History:  The patient's family history includes Cancer in her daughter; Esophageal cancer in her brother; Ovarian cancer in her daughter; Stroke in her mother.  ROS:   Review of Systems  Constitutional: Positive for malaise/fatigue. Negative for chills, diaphoresis, fever and weight loss.  HENT: Negative for congestion.   Eyes: Negative for discharge and redness.  Respiratory: Positive for shortness of breath. Negative for cough, hemoptysis, sputum production and wheezing.   Cardiovascular: Positive for palpitations. Negative for chest pain, orthopnea, claudication, leg swelling and PND.  Gastrointestinal: Negative for abdominal pain, blood in stool, heartburn, melena, nausea and vomiting.  Genitourinary: Negative for hematuria.  Musculoskeletal: Negative for falls and myalgias.  Skin: Negative for rash.  Neurological: Positive for weakness. Negative for dizziness, tingling, tremors, sensory change, speech change, focal weakness and loss of consciousness.  Endo/Heme/Allergies: Does  not bruise/bleed easily.  Psychiatric/Behavioral: Negative for substance abuse. The patient is not nervous/anxious.   All other systems reviewed and are negative.    PHYSICAL EXAM:  VS:  BP (!) 144/88 (BP Location: Left Arm, Patient Position: Sitting, Cuff Size: Normal)   Pulse 88   Ht 5\' 2"  (1.575 m)   Wt 133 lb 8 oz (60.6 kg)   BMI 24.42 kg/m  BMI: Body mass index is 24.42 kg/m.  Physical Exam  Constitutional: She is oriented to person, place, and time. She appears well-developed and well-nourished.  HENT:  Head: Normocephalic and atraumatic.  Eyes: Right eye exhibits no discharge. Left eye exhibits no discharge.  Neck: Normal range of motion. No JVD present.  Cardiovascular: Normal rate, S1 normal and S2  normal. An irregularly irregular rhythm present. Exam reveals no distant heart sounds, no friction rub, no midsystolic click and no opening snap.  Murmur heard. High-pitched blowing holosystolic murmur is present with a grade of 2/6 at the apex. Pulses:      Posterior tibial pulses are 2+ on the right side, and 2+ on the left side.  Pulmonary/Chest: Effort normal and breath sounds normal. No respiratory distress. She has no decreased breath sounds. She has no wheezes. She has no rales. She exhibits no tenderness.  Abdominal: Soft. She exhibits no distension. There is no tenderness.  Musculoskeletal: She exhibits no edema.  Neurological: She is alert and oriented to person, place, and time.  Skin: Skin is warm and dry. No cyanosis. Nails show no clubbing.  Psychiatric: She has a normal mood and affect. Her speech is normal and behavior is normal. Judgment and thought content normal.     EKG:  Was ordered and interpreted by me today. Shows A. fib, 88 bpm, left anterior fascicular block, poor R wave progression  Recent Labs: 08/14/2016: TSH 1.96 01/17/2017: ALT 19 05/02/2017: BUN 21; Creatinine, Ser 0.89; Hemoglobin 11.6; Platelets 227; Potassium 4.4; Sodium 140  01/17/2017: Cholesterol 199; HDL 67.30; LDL Cholesterol 110; Total CHOL/HDL Ratio 3; Triglycerides 107.0; VLDL 21.4   CrCl cannot be calculated (Patient's most recent lab result is older than the maximum 21 days allowed.).   Wt Readings from Last 3 Encounters:  05/26/17 133 lb 8 oz (60.6 kg)  05/16/17 133 lb (60.3 kg)  05/02/17 133 lb 12 oz (60.7 kg)     Other studies reviewed: Additional studies/records reviewed today include: summarized above  ASSESSMENT AND PLAN:  1. Persistent A. Fib: She is back in A. fib today with well-controlled ventricular rate.  Discussed treatment options including continued rate control versus various alternative rhythm control strategies including referral to A. fib clinic.  She is not interested and  alternative rhythm control strategies/referral to A. fib clinic at this time.  She is interested in re-loading with amiodarone with possible repeat cardioversion in the near future.  I discussed with her in detail, given her history of autoimmune hepatitis, long-term higher doses of amiodarone are less than ideal.  We will proceed with reloading of amiodarone at 400 mg twice daily for 5 days followed by 200 mg twice daily for 5days x 200 mg daily thereafter.  We will also titrate her Lopressor to 75 mg twice daily.  She will follow-up with [place] in 10 days to evaluate her rate and rhythm control.  Should she remain in A. fib at that time we would plan for repeat cardioversion.  She has not missed any doses of her Xarelto and has been instructed to continue this  medication.  Should she fail long-term restoration of sinus rhythm following a potential repeat cardioversion I recommend she be referred to the A. fib clinic for further evaluation.  2. CAD involving the native coronary arteries without angina: No symptoms concerning for angina at this time.  Continue current medical therapy.  3. Chronic diastolic CHF/pulmonary hypertension: She does not appear to be grossly volume overloaded.  Was previously on Lasix 20 mg daily this led to a bump in her serum creatinine.  She has been tolerating Lasix 20 mg every other day without issues.  4. Mitral regurgitation: Continue to monitor clinically and with periodic echocardiograms.  5. Essential hypertension: Blood pressure is elevated at 140/88 today.  Increased Lopressor as above.  6. Hyperlipidemia:Not on a statin due to history of autoimmune hepatitis.  Disposition: F/u with myself or Dr. Fletcher Anon in 10 days.  Current medicines are reviewed at length with the patient today.  The patient did not have any concerns regarding medicines.  Signed, Christell Faith, PA-C 05/26/2017 3:23 PM     [place] [address] [place] [address] [place], [place]  [address] [phone]

## 2017-05-26 NOTE — Patient Instructions (Addendum)
Medication Instructions:  Your physician has recommended you make the following change in your medication:  INCREASE metoprolol to 75mg  twice daily  INCREASE amiodarone to 400mg  twice a day for 5 days then  REDUCE amiodarone to 200mg  twice a day for 5 days then  REDUCE amiodarone to 200mg  once a day   Labwork: CBC, CBC, TSH  Testing/Procedures: none  Follow-Up: Your physician recommends that you schedule a follow-up appointment in: 10 days with Dr. Fletcher Anon or Christell Faith, PA-C   Any Other Special Instructions Will Be Listed Below (If Applicable).     If you need a refill on your cardiac medications before your next appointment, please call your pharmacy.

## 2017-05-27 ENCOUNTER — Other Ambulatory Visit (INDEPENDENT_AMBULATORY_CARE_PROVIDER_SITE_OTHER): Payer: Medicare Other

## 2017-05-27 DIAGNOSIS — E7800 Pure hypercholesterolemia, unspecified: Secondary | ICD-10-CM | POA: Diagnosis not present

## 2017-05-27 DIAGNOSIS — I10 Essential (primary) hypertension: Secondary | ICD-10-CM | POA: Diagnosis not present

## 2017-05-27 DIAGNOSIS — R739 Hyperglycemia, unspecified: Secondary | ICD-10-CM | POA: Diagnosis not present

## 2017-05-27 DIAGNOSIS — H209 Unspecified iridocyclitis: Secondary | ICD-10-CM | POA: Diagnosis not present

## 2017-05-27 LAB — BASIC METABOLIC PANEL
BUN: 19 mg/dL (ref 6–23)
CALCIUM: 9.4 mg/dL (ref 8.4–10.5)
CO2: 24 mEq/L (ref 19–32)
Chloride: 108 mEq/L (ref 96–112)
Creatinine, Ser: 1.13 mg/dL (ref 0.40–1.20)
GFR: 48.41 mL/min — AB (ref >60.00)
GLUCOSE: 96 mg/dL (ref 70–99)
Potassium: 4.9 mEq/L (ref 3.5–5.1)
Sodium: 139 mEq/L (ref 135–145)

## 2017-05-27 LAB — HEMOGLOBIN A1C: HEMOGLOBIN A1C: 5.7 % (ref 4.6–6.5)

## 2017-05-27 LAB — COMPREHENSIVE METABOLIC PANEL
ALK PHOS: 113 IU/L (ref 39–117)
ALT: 15 IU/L (ref 0–32)
AST: 12 IU/L (ref 0–40)
Albumin/Globulin Ratio: 1.7 (ref 1.2–2.2)
Albumin: 4.3 g/dL (ref 3.5–4.7)
BUN/Creatinine Ratio: 16 (ref 12–28)
BUN: 16 mg/dL (ref 8–27)
Bilirubin Total: <0.2 mg/dL (ref 0.0–1.2)
CO2: 17 mmol/L — ABNORMAL LOW (ref 20–29)
CREATININE: 1.02 mg/dL — AB (ref 0.57–1.00)
Calcium: 8.9 mg/dL (ref 8.7–10.3)
Chloride: 107 mmol/L — ABNORMAL HIGH (ref 96–106)
GFR calc Af Amer: 58 mL/min/{1.73_m2} — ABNORMAL LOW (ref >59)
GFR calc non Af Amer: 50 mL/min/{1.73_m2} — ABNORMAL LOW (ref >59)
GLUCOSE: 66 mg/dL (ref 65–99)
Globulin, Total: 2.5 g/dL (ref 1.5–4.5)
Potassium: 4.8 mmol/L (ref 3.5–5.2)
Sodium: 141 mmol/L (ref 134–144)
Total Protein: 6.8 g/dL (ref 6.0–8.5)

## 2017-05-27 LAB — HEPATIC FUNCTION PANEL
ALBUMIN: 4.1 g/dL (ref 3.5–5.2)
ALT: 17 U/L (ref 0–35)
AST: 16 U/L (ref 0–37)
Alkaline Phosphatase: 99 U/L (ref 39–117)
Bilirubin, Direct: 0.1 mg/dL (ref 0.0–0.3)
TOTAL PROTEIN: 6.7 g/dL (ref 6.0–8.3)
Total Bilirubin: 0.5 mg/dL (ref 0.2–1.2)

## 2017-05-27 LAB — CBC
HEMATOCRIT: 35.2 % (ref 34.0–46.6)
Hemoglobin: 11 g/dL — ABNORMAL LOW (ref 11.1–15.9)
MCH: 29.9 pg (ref 26.6–33.0)
MCHC: 31.3 g/dL — ABNORMAL LOW (ref 31.5–35.7)
MCV: 96 fL (ref 79–97)
Platelets: 272 10*3/uL (ref 150–379)
RBC: 3.68 x10E6/uL — AB (ref 3.77–5.28)
RDW: 14.1 % (ref 12.3–15.4)
WBC: 6.9 10*3/uL (ref 3.4–10.8)

## 2017-05-27 LAB — TSH: TSH: 2.31 u[IU]/mL (ref 0.450–4.500)

## 2017-05-27 LAB — LIPID PANEL
CHOLESTEROL: 159 mg/dL (ref 0–200)
HDL: 64.3 mg/dL (ref >39.00)
LDL Cholesterol: 74 mg/dL (ref 0–99)
NonHDL: 94.49
TRIGLYCERIDES: 101 mg/dL (ref 0.0–149.0)
Total CHOL/HDL Ratio: 2
VLDL: 20.2 mg/dL (ref 0.0–40.0)

## 2017-05-29 ENCOUNTER — Ambulatory Visit (INDEPENDENT_AMBULATORY_CARE_PROVIDER_SITE_OTHER): Payer: Medicare Other | Admitting: Internal Medicine

## 2017-05-29 VITALS — BP 140/74 | HR 68 | Temp 98.5°F | Resp 18 | Ht 62.0 in | Wt 134.8 lb

## 2017-05-29 DIAGNOSIS — I5032 Chronic diastolic (congestive) heart failure: Secondary | ICD-10-CM | POA: Diagnosis not present

## 2017-05-29 DIAGNOSIS — I2510 Atherosclerotic heart disease of native coronary artery without angina pectoris: Secondary | ICD-10-CM

## 2017-05-29 DIAGNOSIS — R739 Hyperglycemia, unspecified: Secondary | ICD-10-CM

## 2017-05-29 DIAGNOSIS — I10 Essential (primary) hypertension: Secondary | ICD-10-CM | POA: Diagnosis not present

## 2017-05-29 DIAGNOSIS — M545 Low back pain, unspecified: Secondary | ICD-10-CM

## 2017-05-29 DIAGNOSIS — E7800 Pure hypercholesterolemia, unspecified: Secondary | ICD-10-CM | POA: Diagnosis not present

## 2017-05-29 DIAGNOSIS — K219 Gastro-esophageal reflux disease without esophagitis: Secondary | ICD-10-CM

## 2017-05-29 DIAGNOSIS — I481 Persistent atrial fibrillation: Secondary | ICD-10-CM

## 2017-05-29 DIAGNOSIS — I4819 Other persistent atrial fibrillation: Secondary | ICD-10-CM

## 2017-05-29 DIAGNOSIS — K754 Autoimmune hepatitis: Secondary | ICD-10-CM

## 2017-05-29 DIAGNOSIS — E039 Hypothyroidism, unspecified: Secondary | ICD-10-CM | POA: Diagnosis not present

## 2017-05-29 DIAGNOSIS — M810 Age-related osteoporosis without current pathological fracture: Secondary | ICD-10-CM

## 2017-05-29 DIAGNOSIS — D649 Anemia, unspecified: Secondary | ICD-10-CM

## 2017-05-29 MED ORDER — TRAMADOL HCL 50 MG PO TABS: 50.0000 mg | ORAL_TABLET | Freq: Three times a day (TID) | ORAL | 2 refills | Status: DC | PRN

## 2017-05-29 NOTE — Progress Notes (Signed)
Subjective:    [name], female    DOB: 1928/05/19, 84 y.o.   MRN: [id number]  HPI  Patient here for a scheduled follow up.  She has had problems with afib.  S/p cardioversion 05/16/17.  Just evaluated 05/26/17.  Reloaded with amiodarone.  See note.  Planning for possible f/u cardioversion.  She reports some increased stress with her daughter's health issues.  Overall she feels she is handling things relatively well.  Does not feel needs anything more at this time.  No chest pain.  Breathing stable.  No acid reflux.  No abdominal pain.  Bowels moving.  No urine change.  Some right knee issues.  States at times, feels like it could give away.  No significant pain.  Discussed further w/up.    Past Medical History:  Diagnosis Date  . Atrial fibrillation ([place])   . Autoimmune hepatitis ([place])    followed by Dr [name]  . CHF (congestive heart failure) ([place])   . Coronary artery disease    Non-ST elevation myocardial infarction in March 2016. Cardiac catheterization showed 60% ostial left circumflex hazy stenosis which was possibly the culprit. Mild LAD/RCA disease. EF 60% by echo.  . Fibrocystic breast disease   . Hypercholesterolemia   . Hypertension   . Inflammatory arthritis   . MI (myocardial infarction) ([place])   . Neuropathy   . Osteoarthritis   . 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 Thyroid disease    Past Surgical History:  Procedure Laterality Date  . ABDOMINAL HYSTERECTOMY  1963   partial, secondary to fibroids  . APPENDECTOMY    . Billroth    . CARDIAC CATHETERIZATION  05/12/14   [facility]  . CARDIOVERSION N/A 12/26/2016   Procedure: CARDIOVERSION;  Surgeon: [name], MD;  Location: [facility] ORS;  Service: Cardiovascular;  Laterality: N/A;  . CARDIOVERSION N/A 05/16/2017   Procedure: CARDIOVERSION;  Surgeon: [name], MD;  Location: [facility] ORS;  Service: Cardiovascular;  Laterality: N/A;  .  CHOLECYSTECTOMY  1998  . COLONOSCOPY  2009  . UPPER GI ENDOSCOPY  2004   Family History  Problem Relation Age of Onset  . Stroke Mother   . Esophageal cancer Brother        also had lung cancer  . Cancer Daughter        colon  . Ovarian cancer Daughter   . Breast cancer Neg Hx   . Colon cancer Neg Hx    Social History   Socioeconomic History  . Marital status: Widowed    Spouse name: Not on file  . Number of children: Not on file  . Years of education: Not on file  . Highest education level: Not on file  Occupational History  . Not on file  Social Needs  . Financial resource strain: Not on file  . Food insecurity:    Worry: Not on file    Inability: Not on file  . Transportation needs:    Medical: Not on file    Non-medical: Not on file  Tobacco Use  . Smoking status: Never Smoker  . Smokeless tobacco: Never Used  Substance and Sexual Activity  . Alcohol use: No    Alcohol/week: 0.0 oz  . Drug use: No  . Sexual activity: Never  Lifestyle  . Physical activity:    Days per week: Not on file    Minutes per session: Not on file  .  Stress: Not on file  Relationships  . Social connections:    Talks on phone: Not on file    Gets together: Not on file    Attends religious service: Not on file    Active member of club or organization: Not on file    Attends meetings of clubs or organizations: Not on file    Relationship status: Not on file  Other Topics Concern  . Not on file  Social History Narrative  . Not on file    Outpatient Encounter Medications as of 05/29/2017  Medication Sig  . amiodarone (PACERONE) 200 MG tablet Take 1 tablet (200 mg total) by mouth daily.  Marland Kitchen amLODipine (NORVASC) 5 MG tablet Take 5 mg by mouth at bedtime.   . brimonidine-timolol (COMBIGAN) 0.2-0.5 % ophthalmic solution Place 1 drop into the left eye 2 (two) times daily.   . dorzolamide (TRUSOPT) 2 % ophthalmic solution Place 1 drop into the left eye 2 (two) times daily.  . fluticasone  (FLONASE) 50 MCG/ACT nasal spray Place 1-2 sprays into both nostrils daily as needed (for allergies.).   Marland Kitchen furosemide (LASIX) 20 MG tablet Take 1 tablet (20 mg total) by mouth every other day.  . gabapentin (NEURONTIN) 300 MG capsule TAKE 2 CAPSULES BY MOUTH FOUR TIMES DAILY (Patient taking differently: Take 300 mg by mouth in the morning, take 300 mg by mouth at lunch and take 600 mg by mouth at bedtime)  . Hydrocortisone ([id number]-26 EX) Apply 1 application topically 4 (four) times daily as needed (for rash.).  Marland Kitchen ipratropium (ATROVENT) 0.06 % nasal spray Place 2 sprays into both nostrils daily as needed for rhinitis.   Marland Kitchen latanoprost (XALATAN) 0.005 % ophthalmic solution Place 1 drop into the left eye at bedtime.   Marland Kitchen levothyroxine (SYNTHROID, LEVOTHROID) 112 MCG tablet TAKE 1 TABLET BY MOUTH EVERY DAY BEFORE BREAKFAST (Patient taking differently: Take 112 mcg by mouth daily before breakfast. )  . metoprolol tartrate 75 MG TABS Take 75 mg by mouth 2 (two) times daily.  . pantoprazole (PROTONIX) 40 MG tablet Take 1 tablet (40 mg total) by mouth daily.  . rivaroxaban (XARELTO) 20 MG TABS tablet Take 1 tablet (20 mg total) by mouth daily with supper.  . traMADol (ULTRAM) 50 MG tablet Take 1 tablet (50 mg total) by mouth 3 (three) times daily as needed (for pain.).  Marland Kitchen ursodiol (ACTIGALL) 300 MG capsule Take 300-600 mg by mouth See admin instructions. Take 2 capsules (600 mg) daily after lunch & 1 capsule (300 mg) at night.  . [DISCONTINUED] traMADol (ULTRAM) 50 MG tablet Take 1 tablet (50 mg total) 2 (two) times daily as needed by mouth (for pain.).   No facility-administered encounter medications on file as of 05/29/2017.     Review of Systems  Constitutional: Negative for appetite change and unexpected weight change.  HENT: Negative for congestion and sinus pressure.   Respiratory: Negative for cough, chest tightness and shortness of breath.   Cardiovascular: Negative for chest pain and leg  swelling.  Gastrointestinal: Negative for abdominal pain, diarrhea, nausea and vomiting.  Genitourinary: Negative for difficulty urinating and dysuria.  Musculoskeletal: Negative for joint swelling and myalgias.       Right knee issues as outlined.    Skin: Negative for color change and rash.  Neurological: Negative for dizziness, light-headedness and headaches.  Psychiatric/Behavioral: Negative for agitation and dysphoric mood.       Objective:    Physical Exam  Constitutional: She appears well-developed and  well-nourished. No distress.  HENT:  Nose: Nose normal.  Mouth/Throat: Oropharynx is clear and moist.  Neck: Neck supple. No thyromegaly present.  Cardiovascular: Normal rate and regular rhythm.  Pulmonary/Chest: Breath sounds normal. No respiratory distress. She has no wheezes.  Abdominal: Soft. Bowel sounds are normal. There is no tenderness.  Musculoskeletal: She exhibits no edema or tenderness.  Lymphadenopathy:    She has no cervical adenopathy.  Skin: No rash noted. No erythema.  Psychiatric: She has a normal mood and affect. Her behavior is normal.    BP 140/74 (BP Location: Left Arm, Patient Position: Sitting, Cuff Size: Normal)   Pulse 68   Temp 98.5 F (36.9 C) (Oral)   Resp 18   Ht 5\' 2"  (1.575 m)   Wt 134 lb 12.8 oz (61.1 kg)   SpO2 99%   BMI 24.66 kg/m  Wt Readings from Last 3 Encounters:  05/29/17 134 lb 12.8 oz (61.1 kg)  05/26/17 133 lb 8 oz (60.6 kg)  05/16/17 133 lb (60.3 kg)     Lab Results  Component Value Date   WBC 6.9 05/26/2017   HGB 11.0 (L) 05/26/2017   HCT 35.2 05/26/2017   PLT 272 05/26/2017   GLUCOSE 96 05/27/2017   CHOL 159 05/27/2017   TRIG 101.0 05/27/2017   HDL 64.30 05/27/2017   LDLDIRECT 143.5 12/17/2012   LDLCALC 74 05/27/2017   ALT 17 05/27/2017   AST 16 05/27/2017   NA 139 05/27/2017   K 4.9 05/27/2017   CL 108 05/27/2017   CREATININE 1.13 05/27/2017   BUN 19 05/27/2017   CO2 24 05/27/2017   TSH 2.310  05/26/2017   INR 1.32 12/16/2016   HGBA1C 5.7 05/27/2017       Assessment & Plan:   Problem List Items Addressed This Visit    Anemia    Follow cbc.  Recheck iron stores and B12.        Relevant Orders   CBC with Differential/Platelet   Ferritin   Autoimmune hepatitis ([place])    Followed by GI.  Recent liver check wnl.  Follow.       Relevant Orders   Hepatic function panel   Back pain    Previously saw Dr Sharlet Salina.  Overall stable.  Takes tramadol prn.  Follow.        Relevant Medications   traMADol (ULTRAM) 50 MG tablet   Chronic diastolic heart failure ([place])    Followed by cardiology.  Stable.       GERD (gastroesophageal reflux disease)    She will check with her insurance and call back to let me know which PPI is covered.        Hypercholesterolemia    Unable to take statin medication due to underlying liver issues.  Low cholesterol diet and exercise.  Follow lipid panel.        Relevant Orders   Lipid panel   Hypertension    Blood pressure has been under reasonable control.  Continue same medication regimen.  Follow pressures.  Follow metabolic panel.        Relevant Orders   Basic metabolic panel   Hypothyroidism    On thyroid replacement.  Follow tsh.       Osteoporosis    Has received reclast.  Follow vitamin D level.       Persistent atrial fibrillation (HCC)    S/p previous cardioversion.  Persistent problems with afib.  Loaded with amiodarone.  Plans for possible f/u cardioversion.  Other Visit Diagnoses    Hyperglycemia    -  Primary   Relevant Orders   Hemoglobin A1c       Einar Pheasant, MD

## 2017-06-01 ENCOUNTER — Encounter: Payer: Self-pay | Admitting: Internal Medicine

## 2017-06-01 DIAGNOSIS — D649 Anemia, unspecified: Secondary | ICD-10-CM | POA: Insufficient documentation

## 2017-06-01 NOTE — Assessment & Plan Note (Signed)
Previously saw Dr Sharlet Salina.  Overall stable.  Takes tramadol prn.  Follow.

## 2017-06-01 NOTE — Assessment & Plan Note (Signed)
Followed by GI.  Recent liver check wnl.  Follow.

## 2017-06-01 NOTE — Assessment & Plan Note (Signed)
Has received reclast.  Follow vitamin D level.

## 2017-06-01 NOTE — Assessment & Plan Note (Signed)
Followed by cardiology. Stable.   

## 2017-06-01 NOTE — Assessment & Plan Note (Signed)
On thyroid replacement.  Follow tsh.  

## 2017-06-01 NOTE — Assessment & Plan Note (Signed)
Follow cbc.  Recheck iron stores and B12.

## 2017-06-01 NOTE — Assessment & Plan Note (Signed)
S/p previous cardioversion.  Persistent problems with afib.  Loaded with amiodarone.  Plans for possible f/u cardioversion.

## 2017-06-01 NOTE — Assessment & Plan Note (Signed)
Unable to take statin medication due to underlying liver issues.  Low cholesterol diet and exercise.  Follow lipid panel.

## 2017-06-01 NOTE — Assessment & Plan Note (Signed)
Blood pressure has been under reasonable control.  Continue same medication regimen.  Follow pressures.  Follow metabolic panel.  

## 2017-06-01 NOTE — Assessment & Plan Note (Signed)
She will check with her insurance and call back to let me know which PPI is covered.

## 2017-06-03 DIAGNOSIS — H209 Unspecified iridocyclitis: Secondary | ICD-10-CM | POA: Diagnosis not present

## 2017-06-05 ENCOUNTER — Ambulatory Visit (INDEPENDENT_AMBULATORY_CARE_PROVIDER_SITE_OTHER): Payer: Medicare Other | Admitting: Cardiovascular Disease

## 2017-06-05 ENCOUNTER — Encounter: Payer: Self-pay | Admitting: Cardiovascular Disease

## 2017-06-05 ENCOUNTER — Telehealth: Payer: Self-pay | Admitting: Cardiovascular Disease

## 2017-06-05 ENCOUNTER — Ambulatory Visit
Admission: RE | Admit: 2017-06-05 | Discharge: 2017-06-05 | Disposition: A | Discharge status: Home / Self Care | Payer: Medicare Other | Source: Ambulatory Visit | Attending: Gastroenterology | Admitting: Gastroenterology

## 2017-06-05 VITALS — BP 136/70 | HR 83 | Ht 62.0 in | Wt 133.2 lb

## 2017-06-05 DIAGNOSIS — K743 Primary biliary cirrhosis: Secondary | ICD-10-CM | POA: Insufficient documentation

## 2017-06-05 DIAGNOSIS — I481 Persistent atrial fibrillation: Secondary | ICD-10-CM | POA: Diagnosis not present

## 2017-06-05 DIAGNOSIS — I2510 Atherosclerotic heart disease of native coronary artery without angina pectoris: Secondary | ICD-10-CM | POA: Diagnosis not present

## 2017-06-05 DIAGNOSIS — Z01812 Encounter for preprocedural laboratory examination: Secondary | ICD-10-CM | POA: Diagnosis not present

## 2017-06-05 DIAGNOSIS — I10 Essential (primary) hypertension: Secondary | ICD-10-CM | POA: Diagnosis not present

## 2017-06-05 DIAGNOSIS — K7460 Unspecified cirrhosis of liver: Secondary | ICD-10-CM | POA: Diagnosis not present

## 2017-06-05 DIAGNOSIS — E785 Hyperlipidemia, unspecified: Secondary | ICD-10-CM

## 2017-06-05 DIAGNOSIS — I4819 Other persistent atrial fibrillation: Secondary | ICD-10-CM

## 2017-06-05 NOTE — Patient Instructions (Addendum)
Medication Instructions:  Your physician recommends that you continue on your current medications as directed. Please refer to the Current Medication list given to you today.   Labwork: BMET, CBC, PT/INR tomorrow at the [facility] lab, 8am-5pm  Testing/Procedures: Your physician has recommended that you have a Cardioversion (DCCV). Electrical Cardioversion uses a jolt of electricity to your heart either through paddles or wired patches attached to your chest. This is a controlled, usually prescheduled, procedure. Defibrillation is done under light anesthesia in the hospital, and you usually go home the day of the procedure. This is done to get your heart back into a normal rhythm. You are not awake for the procedure. Please see the instruction sheet given to you today.  Monday, April 1 6:30am arrival at the [facility] to eat or drink after midnight the evening before your procedure. You may take your morning medications with a sip of water.  Please do not miss any doses of xarelto.  Hold metoprolol the morning of April 1  Follow-Up: Your physician recommends that you schedule a follow-up appointment in: 1 month with Dr. Fletcher Anon.    Any Other Special Instructions Will Be Listed Below (If Applicable).     If you need a refill on your cardiac medications before your next appointment, please call your pharmacy.   Electrical Cardioversion Electrical cardioversion is the delivery of a jolt of electricity to restore a normal rhythm to the heart. A rhythm that is too fast or is not regular keeps the heart from pumping well. In this procedure, sticky patches or metal paddles are placed on the chest to deliver electricity to the heart from a device. This procedure may be done in an emergency if:  There is low or no blood pressure as a result of the heart rhythm.  Normal rhythm must be restored as fast as possible to protect the brain and heart from further damage.  It may save a  life.  This procedure may also be done for irregular or fast heart rhythms that are not immediately life-threatening. Tell a health care provider about:  Any allergies you have.  All medicines you are taking, including vitamins, herbs, eye drops, creams, and over-the-counter medicines.  Any problems you or family members have had with anesthetic medicines.  Any blood disorders you have.  Any surgeries you have had.  Any medical conditions you have.  Whether you are pregnant or may be pregnant. What are the risks? Generally, this is a safe procedure. However, problems may occur, including:  Allergic reactions to medicines.  A blood clot that breaks free and travels to other parts of your body.  The possible return of an abnormal heart rhythm within hours or days after the procedure.  Your heart stopping (cardiac arrest). This is rare.  What happens before the procedure? Medicines  Your health care provider may have you start taking: ? Blood-thinning medicines (anticoagulants) so your blood does not clot as easily. ? Medicines may be given to help stabilize your heart rate and rhythm.  Ask your health care provider about changing or stopping your regular medicines. This is especially important if you are taking diabetes medicines or blood thinners. General instructions  Plan to have someone take you home from the hospital or clinic.  If you will be going home right after the procedure, plan to have someone with you for 24 hours.  Follow instructions from your health care provider about eating or drinking restrictions. What happens during the procedure?  To lower your risk of infection: ? Your health care team will wash or sanitize their hands. ? Your skin will be washed with soap.  An IV tube will be inserted into one of your veins.  You will be given a medicine to help you relax (sedative).  Sticky patches (electrodes) or metal paddles may be placed on your  chest.  An electrical shock will be delivered. The procedure may vary among health care providers and hospitals. What happens after the procedure?  Your blood pressure, heart rate, breathing rate, and blood oxygen level will be monitored until the medicines you were given have worn off.  Do not drive for 24 hours if you were given a sedative.  Your heart rhythm will be watched to make sure it does not change. This information is not intended to replace advice given to you by your health care provider. Make sure you discuss any questions you have with your health care provider. Document Released: 02/15/2002 Document Revised: 10/25/2015 Document Reviewed: 09/01/2015 Elsevier Interactive Patient Education  2017 [employer].

## 2017-06-05 NOTE — Telephone Encounter (Signed)
Patient needs nurse visit for EKG in 2 weeks post cardioversion and a 1 month follow up with Dr. Fletcher Anon. Routed to Support Pool to contact the patient.

## 2017-06-05 NOTE — Progress Notes (Signed)
Cardiology Office Note   Date:  06/05/2017   ID:  Ruth Roth, DOB 12/25/1928, MRN [id number]  PCP:  Einar Pheasant, MD  Cardiologist:   [name], MD   Chief Complaint  Patient presents with  . other    10 day follow up. Patient c/o SOB when walking or over doing herself. Meds reviewed verbally with patient.       History of Present Illness: Ruth Roth is a 84 y.o. female who presents for a follow-up visit regarding coronary artery disease ,chronic diastolic heart failure and persistent atrial fibrillation .  She has known history of hypertension, peptic ulcer disease, fibrocystic breast disease, inflammatory arthritis, autoimmune hepatitis and neuropathy.  She had a small non-ST elevation myocardial infarction in March 2016 with acute diastolic heart failure after a GI illness.  Echocardiogram showed normal LV systolic function. Cardiac catheterization showed showed 60% ostial left circumflex hazy stenosis which was possibly the culprit. Mild LAD/RCA disease. She was treated medically.   She underwent successful cardioversion in October to sinus rhythm and had significant improvement in her symptoms.   She had recurrent atrial fibrillation and thus she was loaded with amiodarone with repeat successful cardioversion.  However, she was noted after that to have recurrent atrial fibrillation.  She reports feeling terrible when she is in A. fib with no energy to do anything and significant palpitations.  No chest pain.  Past Medical History:  Diagnosis Date  . Atrial fibrillation ([place])   . Autoimmune hepatitis ([place])    followed by Dr [name]  . CHF (congestive heart failure) ([place])   . Coronary artery disease    Non-ST elevation myocardial infarction in March 2016. Cardiac catheterization showed 60% ostial left circumflex hazy stenosis which was possibly the culprit. Mild LAD/RCA disease. EF 60% by echo.  . Fibrocystic breast disease   . Hypercholesterolemia   .  Hypertension   . Inflammatory arthritis   . MI (myocardial infarction) ([place])   . Neuropathy   . Osteoarthritis   . 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 Thyroid disease     Past Surgical History:  Procedure Laterality Date  . ABDOMINAL HYSTERECTOMY  1963   partial, secondary to fibroids  . APPENDECTOMY    . Billroth    . CARDIAC CATHETERIZATION  05/12/14   [facility]  . CARDIOVERSION N/A 12/26/2016   Procedure: CARDIOVERSION;  Surgeon: [name], MD;  Location: [facility] ORS;  Service: Cardiovascular;  Laterality: N/A;  . CARDIOVERSION N/A 05/16/2017   Procedure: CARDIOVERSION;  Surgeon: [name], MD;  Location: [facility] ORS;  Service: Cardiovascular;  Laterality: N/A;  . CHOLECYSTECTOMY  1998  . COLONOSCOPY  2009  . UPPER GI ENDOSCOPY  2004     Current Outpatient Medications  Medication Sig Dispense Refill  . amiodarone (PACERONE) 200 MG tablet Take 200 mg by mouth 2 (two) times daily.    Marland Kitchen amLODipine (NORVASC) 5 MG tablet Take 5 mg by mouth at bedtime.     . brimonidine-timolol (COMBIGAN) 0.2-0.5 % ophthalmic solution Place 1 drop into the left eye 2 (two) times daily.     . dorzolamide (TRUSOPT) 2 % ophthalmic solution Place 1 drop into the left eye 2 (two) times daily.    . fluticasone (FLONASE) 50 MCG/ACT nasal spray Place 1-2 sprays into both nostrils daily as needed (for allergies.).     Marland Kitchen furosemide (LASIX) 20 MG tablet Take 1 tablet (  20 mg total) by mouth every other day. 90 tablet 1  . gabapentin (NEURONTIN) 300 MG capsule TAKE 2 CAPSULES BY MOUTH FOUR TIMES DAILY (Patient taking differently: Take 300 mg by mouth in the morning, take 300 mg by mouth at lunch and take 600 mg by mouth at bedtime) 720 capsule 0  . Hydrocortisone (DPOEUMPNT-61 EX) Apply 1 application topically 4 (four) times daily as needed (for rash.).    Marland Kitchen ipratropium (ATROVENT) 0.06 % nasal spray Place 2 sprays into both nostrils daily as needed for  rhinitis.     Marland Kitchen latanoprost (XALATAN) 0.005 % ophthalmic solution Place 1 drop into the left eye at bedtime.     Marland Kitchen levothyroxine (SYNTHROID, LEVOTHROID) 112 MCG tablet TAKE 1 TABLET BY MOUTH EVERY DAY BEFORE BREAKFAST (Patient taking differently: Take 112 mcg by mouth daily before breakfast. ) 90 tablet 11  . metoprolol tartrate 75 MG TABS Take 75 mg by mouth 2 (two) times daily. 180 tablet 3  . pantoprazole (PROTONIX) 40 MG tablet Take 1 tablet (40 mg total) by mouth daily. 90 tablet 1  . rivaroxaban (XARELTO) 20 MG TABS tablet Take 1 tablet (20 mg total) by mouth daily with supper. 30 tablet 5  . traMADol (ULTRAM) 50 MG tablet Take 1 tablet (50 mg total) by mouth 3 (three) times daily as needed (for pain.). 90 tablet 2  . ursodiol (ACTIGALL) 300 MG capsule Take 300-600 mg by mouth See admin instructions. Take 2 capsules (600 mg) daily after lunch & 1 capsule (300 mg) at night.     No current facility-administered medications for this visit.     Allergies:   Statins; Haldol [haloperidol lactate]; Librax [chlordiazepoxide-clidinium]; Plavix [clopidogrel bisulfate]; and Ramipril    Social History:  The patient  reports that she has never smoked. She has never used smokeless tobacco. She reports that she does not drink alcohol or use drugs.   Family History:  The patient's family history includes Cancer in her daughter; Esophageal cancer in her brother; Ovarian cancer in her daughter; Stroke in her mother.    ROS:  Please see the history of present illness.   Otherwise, review of systems are positive for none.   All other systems are reviewed and negative.    PHYSICAL EXAM: VS:  BP 136/70 (BP Location: Left Arm, Patient Position: Sitting, Cuff Size: Normal)   Pulse 83   Ht 5\' 2"  (1.575 m)   Wt 133 lb 4 oz (60.4 kg)   BMI 24.37 kg/m  , BMI Body mass index is 24.37 kg/m. GEN: Well nourished, well developed, in no acute distress  HEENT: normal  Neck: no JVD, carotid bruits, or  masses Cardiac: Irregularly irregular; no rubs, or gallops,no edema . 1/ 6 systolic ejection murmur at the aortic area Respiratory:  clear to auscultation bilaterally, normal work of breathing GI: soft, nontender, nondistended, + BS MS: no deformity or atrophy  Skin: warm and dry, no rash Neuro:  Strength and sensation are intact Psych: euthymic mood, full affect   EKG:  EKG is ordered today. The ekg ordered today demonstrates atrial fibrillation with nonspecific ST changes.  Ventricular rate is 83 bpm.   Recent Labs: 05/26/2017: Hemoglobin 11.0; Platelets 272; TSH 2.310 05/27/2017: ALT 17; BUN 19; Creatinine, Ser 1.13; Potassium 4.9; Sodium 139    Lipid Panel    Component Value Date/Time   CHOL 159 05/27/2017 0959   TRIG 101.0 05/27/2017 0959   HDL 64.30 05/27/2017 0959   CHOLHDL 2 05/27/2017 0959  VLDL 20.2 05/27/2017 0959   LDLCALC 74 05/27/2017 0959   LDLDIRECT 143.5 12/17/2012 0955      Wt Readings from Last 3 Encounters:  06/05/17 133 lb 4 oz (60.4 kg)  05/29/17 134 lb 12.8 oz (61.1 kg)  05/26/17 133 lb 8 oz (60.6 kg)       ASSESSMENT AND PLAN:  1.  Persistent atrial fibrillation:   The patient has recurrent atrial fibrillation.  Unfortunately, she is highly symptomatic from this.  I discussed with her the option of continued rate control.  However, she feels much better when she is in sinus rhythm and she was reloaded with amiodarone.  The plan is to proceed with cardioversion on Monday.  I discussed the procedure again and she is aware of risks and benefits.  She has been effectively anticoagulated with Xarelto.   I explained to her that this will likely be the last cardioversion.  If she goes into A. fib again in the future we should focus on rate control by increasing metoprolol and possibly switching amlodipine to localize him.  2. Coronary artery disease involving native coronary arteries without angina:  Continue medical therapy.  3. Chronic diastolic heart  failure: She appears to be euvolemic.  4. Essential hypertension: Blood pressure is controlled on current medications.  5. Hyperlipidemia: Not on a statin due to history of autoimmune hepatitis. Most recent LDL was 138.  6.  Mitral regurgitation this was in the setting of A. fib with RVR.  Continue to monitor clinically..   Disposition:   FU with me in few weeks after cardioversion.  Signed,  [name], MD  06/05/2017 4:55 PM    [facility]

## 2017-06-05 NOTE — H&P (View-Only) (Signed)
Cardiology Office Note   Date:  06/05/2017   ID:  Ruth Roth, DOB 01-29-1929, MRN [id number]  PCP:  Einar Pheasant, MD  Cardiologist:   [name], MD   Chief Complaint  Patient presents with  . other    10 day follow up. Patient c/o SOB when walking or over doing herself. Meds reviewed verbally with patient.       History of Present Illness: Ruth Roth is a 84 y.o. female who presents for a follow-up visit regarding coronary artery disease ,chronic diastolic heart failure and persistent atrial fibrillation .  She has known history of hypertension, peptic ulcer disease, fibrocystic breast disease, inflammatory arthritis, autoimmune hepatitis and neuropathy.  She had a small non-ST elevation myocardial infarction in March 2016 with acute diastolic heart failure after a GI illness.  Echocardiogram showed normal LV systolic function. Cardiac catheterization showed showed 60% ostial left circumflex hazy stenosis which was possibly the culprit. Mild LAD/RCA disease. She was treated medically.   She underwent successful cardioversion in October to sinus rhythm and had significant improvement in her symptoms.   She had recurrent atrial fibrillation and thus she was loaded with amiodarone with repeat successful cardioversion.  However, she was noted after that to have recurrent atrial fibrillation.  She reports feeling terrible when she is in A. fib with no energy to do anything and significant palpitations.  No chest pain.  Past Medical History:  Diagnosis Date  . Atrial fibrillation ([place])   . Autoimmune hepatitis ([place])    followed by Dr [name]  . CHF (congestive heart failure) ([place])   . Coronary artery disease    Non-ST elevation myocardial infarction in March 2016. Cardiac catheterization showed 60% ostial left circumflex hazy stenosis which was possibly the culprit. Mild LAD/RCA disease. EF 60% by echo.  . Fibrocystic breast disease   . Hypercholesterolemia   .  Hypertension   . Inflammatory arthritis   . MI (myocardial infarction) ([place])   . Neuropathy   . Osteoarthritis   . 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 Thyroid disease     Past Surgical History:  Procedure Laterality Date  . ABDOMINAL HYSTERECTOMY  1963   partial, secondary to fibroids  . APPENDECTOMY    . Billroth    . CARDIAC CATHETERIZATION  05/12/14   [facility]  . CARDIOVERSION N/A 12/26/2016   Procedure: CARDIOVERSION;  Surgeon: [name], MD;  Location: [facility] ORS;  Service: Cardiovascular;  Laterality: N/A;  . CARDIOVERSION N/A 05/16/2017   Procedure: CARDIOVERSION;  Surgeon: [name], MD;  Location: [facility] ORS;  Service: Cardiovascular;  Laterality: N/A;  . CHOLECYSTECTOMY  1998  . COLONOSCOPY  2009  . UPPER GI ENDOSCOPY  2004     Current Outpatient Medications  Medication Sig Dispense Refill  . amiodarone (PACERONE) 200 MG tablet Take 200 mg by mouth 2 (two) times daily.    Marland Kitchen amLODipine (NORVASC) 5 MG tablet Take 5 mg by mouth at bedtime.     . brimonidine-timolol (COMBIGAN) 0.2-0.5 % ophthalmic solution Place 1 drop into the left eye 2 (two) times daily.     . dorzolamide (TRUSOPT) 2 % ophthalmic solution Place 1 drop into the left eye 2 (two) times daily.    . fluticasone (FLONASE) 50 MCG/ACT nasal spray Place 1-2 sprays into both nostrils daily as needed (for allergies.).     Marland Kitchen furosemide (LASIX) 20 MG tablet Take 1 tablet (  20 mg total) by mouth every other day. 90 tablet 1  . gabapentin (NEURONTIN) 300 MG capsule TAKE 2 CAPSULES BY MOUTH FOUR TIMES DAILY (Patient taking differently: Take 300 mg by mouth in the morning, take 300 mg by mouth at lunch and take 600 mg by mouth at bedtime) 720 capsule 0  . Hydrocortisone (VHQIONGEX-52 EX) Apply 1 application topically 4 (four) times daily as needed (for rash.).    Marland Kitchen ipratropium (ATROVENT) 0.06 % nasal spray Place 2 sprays into both nostrils daily as needed for  rhinitis.     Marland Kitchen latanoprost (XALATAN) 0.005 % ophthalmic solution Place 1 drop into the left eye at bedtime.     Marland Kitchen levothyroxine (SYNTHROID, LEVOTHROID) 112 MCG tablet TAKE 1 TABLET BY MOUTH EVERY DAY BEFORE BREAKFAST (Patient taking differently: Take 112 mcg by mouth daily before breakfast. ) 90 tablet 11  . metoprolol tartrate 75 MG TABS Take 75 mg by mouth 2 (two) times daily. 180 tablet 3  . pantoprazole (PROTONIX) 40 MG tablet Take 1 tablet (40 mg total) by mouth daily. 90 tablet 1  . rivaroxaban (XARELTO) 20 MG TABS tablet Take 1 tablet (20 mg total) by mouth daily with supper. 30 tablet 5  . traMADol (ULTRAM) 50 MG tablet Take 1 tablet (50 mg total) by mouth 3 (three) times daily as needed (for pain.). 90 tablet 2  . ursodiol (ACTIGALL) 300 MG capsule Take 300-600 mg by mouth See admin instructions. Take 2 capsules (600 mg) daily after lunch & 1 capsule (300 mg) at night.     No current facility-administered medications for this visit.     Allergies:   Statins; Haldol [haloperidol lactate]; Librax [chlordiazepoxide-clidinium]; Plavix [clopidogrel bisulfate]; and Ramipril    Social History:  The patient  reports that she has never smoked. She has never used smokeless tobacco. She reports that she does not drink alcohol or use drugs.   Family History:  The patient's family history includes Cancer in her daughter; Esophageal cancer in her brother; Ovarian cancer in her daughter; Stroke in her mother.    ROS:  Please see the history of present illness.   Otherwise, review of systems are positive for none.   All other systems are reviewed and negative.    PHYSICAL EXAM: VS:  BP 136/70 (BP Location: Left Arm, Patient Position: Sitting, Cuff Size: Normal)   Pulse 83   Ht 5\' 2"  (1.575 m)   Wt 133 lb 4 oz (60.4 kg)   BMI 24.37 kg/m  , BMI Body mass index is 24.37 kg/m. GEN: Well nourished, well developed, in no acute distress  HEENT: normal  Neck: no JVD, carotid bruits, or  masses Cardiac: Irregularly irregular; no rubs, or gallops,no edema . 1/ 6 systolic ejection murmur at the aortic area Respiratory:  clear to auscultation bilaterally, normal work of breathing GI: soft, nontender, nondistended, + BS MS: no deformity or atrophy  Skin: warm and dry, no rash Neuro:  Strength and sensation are intact Psych: euthymic mood, full affect   EKG:  EKG is ordered today. The ekg ordered today demonstrates atrial fibrillation with nonspecific ST changes.  Ventricular rate is 83 bpm.   Recent Labs: 05/26/2017: Hemoglobin 11.0; Platelets 272; TSH 2.310 05/27/2017: ALT 17; BUN 19; Creatinine, Ser 1.13; Potassium 4.9; Sodium 139    Lipid Panel    Component Value Date/Time   CHOL 159 05/27/2017 0959   TRIG 101.0 05/27/2017 0959   HDL 64.30 05/27/2017 0959   CHOLHDL 2 05/27/2017 0959  VLDL 20.2 05/27/2017 0959   LDLCALC 74 05/27/2017 0959   LDLDIRECT 143.5 12/17/2012 0955      Wt Readings from Last 3 Encounters:  06/05/17 133 lb 4 oz (60.4 kg)  05/29/17 134 lb 12.8 oz (61.1 kg)  05/26/17 133 lb 8 oz (60.6 kg)       ASSESSMENT AND PLAN:  1.  Persistent atrial fibrillation:   The patient has recurrent atrial fibrillation.  Unfortunately, she is highly symptomatic from this.  I discussed with her the option of continued rate control.  However, she feels much better when she is in sinus rhythm and she was reloaded with amiodarone.  The plan is to proceed with cardioversion on Monday.  I discussed the procedure again and she is aware of risks and benefits.  She has been effectively anticoagulated with Xarelto.   I explained to her that this will likely be the last cardioversion.  If she goes into A. fib again in the future we should focus on rate control by increasing metoprolol and possibly switching amlodipine to localize him.  2. Coronary artery disease involving native coronary arteries without angina:  Continue medical therapy.  3. Chronic diastolic heart  failure: She appears to be euvolemic.  4. Essential hypertension: Blood pressure is controlled on current medications.  5. Hyperlipidemia: Not on a statin due to history of autoimmune hepatitis. Most recent LDL was 138.  6.  Mitral regurgitation this was in the setting of A. fib with RVR.  Continue to monitor clinically..   Disposition:   FU with me in few weeks after cardioversion.  Signed,  [name], MD  06/05/2017 4:55 PM    [facility]

## 2017-06-06 ENCOUNTER — Telehealth: Payer: Self-pay | Admitting: Cardiovascular Disease

## 2017-06-06 DIAGNOSIS — K743 Primary biliary cirrhosis: Secondary | ICD-10-CM | POA: Diagnosis not present

## 2017-06-06 NOTE — Telephone Encounter (Signed)
No ans no vm   °

## 2017-06-06 NOTE — Telephone Encounter (Signed)
Left detailed message on home VM with DCCV instructions.

## 2017-06-07 ENCOUNTER — Other Ambulatory Visit: Payer: Self-pay | Admitting: Cardiovascular Disease

## 2017-06-09 ENCOUNTER — Ambulatory Visit: Payer: Medicare Other | Admitting: Anesthesiology

## 2017-06-09 ENCOUNTER — Ambulatory Visit
Admission: RE | Admit: 2017-06-09 | Discharge: 2017-06-09 | Disposition: A | Discharge status: Home / Self Care | Payer: Medicare Other | Source: Ambulatory Visit | Attending: Cardiovascular Disease | Admitting: Cardiovascular Disease

## 2017-06-09 ENCOUNTER — Encounter: Payer: Self-pay | Admitting: *Deleted

## 2017-06-09 ENCOUNTER — Encounter
Admission: RE | Disposition: A | Discharge status: Home / Self Care | Payer: Self-pay | Source: Ambulatory Visit | Attending: Cardiovascular Disease

## 2017-06-09 DIAGNOSIS — I5032 Chronic diastolic (congestive) heart failure: Secondary | ICD-10-CM | POA: Diagnosis not present

## 2017-06-09 DIAGNOSIS — E7800 Pure hypercholesterolemia, unspecified: Secondary | ICD-10-CM | POA: Insufficient documentation

## 2017-06-09 DIAGNOSIS — I481 Persistent atrial fibrillation: Secondary | ICD-10-CM | POA: Diagnosis not present

## 2017-06-09 DIAGNOSIS — I110 Hypertensive heart disease with heart failure: Secondary | ICD-10-CM | POA: Insufficient documentation

## 2017-06-09 DIAGNOSIS — E039 Hypothyroidism, unspecified: Secondary | ICD-10-CM | POA: Insufficient documentation

## 2017-06-09 DIAGNOSIS — I2510 Atherosclerotic heart disease of native coronary artery without angina pectoris: Secondary | ICD-10-CM | POA: Diagnosis not present

## 2017-06-09 DIAGNOSIS — I252 Old myocardial infarction: Secondary | ICD-10-CM | POA: Diagnosis not present

## 2017-06-09 DIAGNOSIS — I4891 Unspecified atrial fibrillation: Secondary | ICD-10-CM | POA: Diagnosis not present

## 2017-06-09 DIAGNOSIS — Z79899 Other long term (current) drug therapy: Secondary | ICD-10-CM | POA: Insufficient documentation

## 2017-06-09 HISTORY — PX: CARDIOVERSION: EP1203

## 2017-06-09 SURGERY — CARDIOVERSION (CATH LAB)
Anesthesia: General

## 2017-06-09 MED ORDER — PROPOFOL 10 MG/ML IV BOLUS
INTRAVENOUS | Status: DC | PRN
  Administered 2017-06-09: 30 mg via INTRAVENOUS
  Administered 2017-06-09: 10 mg via INTRAVENOUS

## 2017-06-09 MED ORDER — EPHEDRINE SULFATE 50 MG/ML IJ SOLN
INTRAMUSCULAR | Status: AC
  Filled 2017-06-09: qty 1

## 2017-06-09 MED ORDER — AMIODARONE HCL 200 MG PO TABS: 200.0000 mg | ORAL_TABLET | Freq: Every day | ORAL | Status: DC

## 2017-06-09 MED ORDER — MIDAZOLAM HCL 2 MG/2ML IJ SOLN
INTRAMUSCULAR | Status: AC
  Filled 2017-06-09: qty 2

## 2017-06-09 MED ORDER — MIDAZOLAM HCL 2 MG/2ML IJ SOLN
INTRAMUSCULAR | Status: DC | PRN
  Administered 2017-06-09: 0.5 mg via INTRAVENOUS

## 2017-06-09 MED ORDER — PROPOFOL 10 MG/ML IV BOLUS
INTRAVENOUS | Status: AC
  Filled 2017-06-09: qty 20

## 2017-06-09 MED ORDER — SODIUM CHLORIDE 0.9 % IV SOLN
INTRAVENOUS | Status: DC | PRN
  Administered 2017-06-09: 07:00:00 via INTRAVENOUS

## 2017-06-09 NOTE — Anesthesia Postprocedure Evaluation (Signed)
Anesthesia Post Note  Patient: Ruth Roth  Procedure(s) Performed: CARDIOVERSION (N/A )  Patient location during evaluation: Other Anesthesia Type: General Level of consciousness: awake and alert and oriented Pain management: pain level controlled Vital Signs Assessment: post-procedure vital signs reviewed and stable Respiratory status: spontaneous breathing, nonlabored ventilation and respiratory function stable Cardiovascular status: blood pressure returned to baseline and stable Postop Assessment: no signs of nausea or vomiting Anesthetic complications: no     Last Vitals:  Vitals:   06/09/17 0800 06/09/17 0815  BP: 103/61 115/74  Pulse: (!) 46 (!) 50  Resp: 16 15  Temp:    SpO2: 97% 99%    Last Pain:  Vitals:   06/09/17 0815  PainSc: 0-No pain                 Kamie Korber

## 2017-06-09 NOTE — Transfer of Care (Signed)
Immediate Anesthesia Transfer of Care Note  Patient: Ruth Roth  Procedure(s) Performed: CARDIOVERSION (N/A )  Patient Location: PACU  Anesthesia Type:General  Level of Consciousness: sedated  Airway & Oxygen Therapy: Patient Spontanous Breathing and Patient connected to nasal cannula oxygen  Post-op Assessment: Report given to RN and Post -op Vital signs reviewed and stable  Post vital signs: Reviewed and stable  Last Vitals: 752 Vitals Value Taken Time  BP 108/67   Temp    Pulse 46   Resp 15   SpO2 95     Last Pain: There were no vitals filed for this visit.       Complications: No apparent anesthesia complications

## 2017-06-09 NOTE — Telephone Encounter (Signed)
Returned call to patient who requests later time for nurse visit/EKG on April 12.  She is also scheduled for f/u on April 30. Per Dr. Tyrell Antonio notes today after cardioversion, she should follow up with him in 2-3 weeks. Rescheduled OV to April 19, 3pm. Pt agreeable. Cancelled 4/12 EKG and 4/30 OV.

## 2017-06-09 NOTE — Anesthesia Post-op Follow-up Note (Signed)
Anesthesia QCDR form completed.        

## 2017-06-09 NOTE — Interval H&P Note (Signed)
History and Physical Interval Note:  06/09/2017 7:56 AM  Ruth Roth  has presented today for surgery, with the diagnosis of Cardioversion  Atrial fibrillation  The various methods of treatment have been discussed with the patient and family. After consideration of risks, benefits and other options for treatment, the patient has consented to  Procedure(s): CARDIOVERSION (N/A) as a surgical intervention .  The patient's history has been reviewed, patient examined, no change in status, stable for surgery.  I have reviewed the patient's chart and labs.  Questions were answered to the patient's satisfaction.     [name]

## 2017-06-09 NOTE — Telephone Encounter (Signed)
Mrs. Ruth Roth is requesting a refill for Furosemide 20 mg one tablet daily.  Dr. Tyrell Antonio last office note states to take Furosemide 20 mg one tablet every other day but the Rx is for Furosemide 20 mg one tablet daily. Please review and advise instructions to the patient.

## 2017-06-09 NOTE — CV Procedure (Signed)
Cardioversion note: A standard informed consent was obtained. Timeout was performed. The pads were placed in the anterior posterior fashion. The patient was given propofol by the anesthesia team.  Successful cardioversion was performed with a 200 J. The patient converted to sinus rhythm. Pre-and post EKGs were reviewed. The patient tolerated the procedure with no immediate complications.  Recommendations: Decrease Metoprolol to 50 mg bid and follow-up in 2-3 weeks.

## 2017-06-09 NOTE — Telephone Encounter (Signed)
Patient returning call please call about Friday appt

## 2017-06-09 NOTE — Anesthesia Preprocedure Evaluation (Signed)
Anesthesia Evaluation  Patient identified by MRN, date of birth, ID band Patient awake    Reviewed: Allergy & Precautions, NPO status , Patient's Chart, lab work & pertinent test results  History of Anesthesia Complications Negative for: history of anesthetic complications  Airway Mallampati: II  TM Distance: >3 FB Neck ROM: Full    Dental no notable dental hx.    Pulmonary neg sleep apnea, neg COPD,    breath sounds clear to auscultation- rhonchi (-) wheezing      Cardiovascular hypertension, Pt. on medications + CAD, + Past MI and +CHF  (-) Cardiac Stents and (-) CABG + dysrhythmias  Rhythm:Irregular Rate:Normal - Systolic murmurs and - Diastolic murmurs Echo 10/27/54: - Left ventricle: The cavity size was normal. Wall thickness was   normal. Systolic function was normal. The estimated ejection   fraction was in the range of 50% to 55%. Wall motion was normal;   there were no regional wall motion abnormalities. The study was   not technically sufficient to allow evaluation of LV diastolic   dysfunction due to atrial fibrillation. - Aortic valve: There was trivial regurgitation. - Mitral valve: Calcified annulus. There was moderate to severe   regurgitation. - Left atrium: The atrium was mildly dilated. - Tricuspid valve: There was moderate regurgitation. - Pulmonic valve: There was moderate regurgitation. - Pulmonary arteries: Systolic pressure was moderately to severely   increased. PA peak pressure: 60 mm Hg (S).   Neuro/Psych negative neurological ROS  negative psych ROS   GI/Hepatic PUD, GERD  Medicated,(+) Hepatitis -, Autoimmune  Endo/Other  neg diabetesHypothyroidism   Renal/GU Renal InsufficiencyRenal disease     Musculoskeletal  (+) Arthritis ,   Abdominal (+) - obese,   Peds  Hematology  (+) anemia ,   Anesthesia Other Findings Past Medical History: No date: Atrial fibrillation ([facility]) No date:  Autoimmune hepatitis ([place])     Comment:  followed by Dr [name] No date: CHF (congestive heart failure) ([place]) No date: Coronary artery disease     Comment:  Non-ST elevation myocardial infarction in March 2016.               Cardiac catheterization showed 60% ostial left circumflex              hazy stenosis which was possibly the culprit. Mild               LAD/RCA disease. EF 60% by echo. No date: Fibrocystic breast disease No date: Hypercholesterolemia No date: Hypertension No date: Inflammatory arthritis No date: MI (myocardial infarction) ([place]) No date: Neuropathy No date: Osteoarthritis No date: PUD (peptic ulcer disease)     Comment:  requiring Billroth II surgery with resulting dumping               syndrome No date: Thyroid disease   Reproductive/Obstetrics                             Anesthesia Physical Anesthesia Plan  ASA: III  Anesthesia Plan: General   Post-op Pain Management:    Induction: Intravenous  PONV Risk Score and Plan: 2 and Propofol infusion  Airway Management Planned: Natural Airway  Additional Equipment:   Intra-op Plan:   Post-operative Plan:   Informed Consent: I have reviewed the patients History and Physical, chart, labs and discussed the procedure including the risks, benefits and alternatives for the proposed anesthesia with the patient or authorized representative who has indicated  his/her understanding and acceptance.   Dental advisory given  Plan Discussed with: CRNA and Anesthesiologist  Anesthesia Plan Comments:         Anesthesia Quick Evaluation

## 2017-06-10 ENCOUNTER — Telehealth: Payer: Self-pay | Admitting: Cardiovascular Disease

## 2017-06-10 NOTE — Telephone Encounter (Signed)
She is at low risk but Xarelto cannot be stopped or held at this time given recent cardioversion.

## 2017-06-10 NOTE — Telephone Encounter (Signed)
° °  [facility] Pre-operative Risk Assessment    Request for surgical clearance:  1. What type of surgery is being performed? Root Canal and Bridge   2. When is this surgery scheduled? 06/11/17  3. What type of clearance is required (medical clearance vs. Pharmacy clearance to hold med vs. Both)? medical  4. Are there any medications that need to be held prior to surgery and how long? Not noted on form   5. Practice name and name of physician performing surgery?  [facility]  Dr. Andree Elk   6. What is your office phone and fax number?  [phone] fax [phone]  7. Anesthesia type (None, local, MAC, general) ? Local anesthesia or oral sedation   Clarisse Gouge 06/10/2017, 1:08 PM  _________________________________________________________________   (provider comments below)

## 2017-06-11 ENCOUNTER — Other Ambulatory Visit: Payer: Self-pay | Admitting: Gastroenterology

## 2017-06-11 ENCOUNTER — Telehealth: Payer: Self-pay | Admitting: Cardiovascular Disease

## 2017-06-11 DIAGNOSIS — K743 Primary biliary cirrhosis: Secondary | ICD-10-CM | POA: Diagnosis not present

## 2017-06-11 NOTE — Telephone Encounter (Signed)
Faxed note via Epic to [employer], [phone]

## 2017-06-11 NOTE — Telephone Encounter (Signed)
Notified [facility] patient takes xarelto 20mg  QD.

## 2017-06-11 NOTE — Telephone Encounter (Signed)
[facility] Suanne Marker calling needing to know which blood thinner is patient on   Please call back

## 2017-06-13 DIAGNOSIS — E039 Hypothyroidism, unspecified: Secondary | ICD-10-CM | POA: Diagnosis not present

## 2017-06-18 ENCOUNTER — Ambulatory Visit
Admission: RE | Admit: 2017-06-18 | Discharge: 2017-06-18 | Disposition: A | Discharge status: Home / Self Care | Payer: Medicare Other | Source: Ambulatory Visit | Attending: Internal Medicine | Admitting: Internal Medicine

## 2017-06-18 DIAGNOSIS — Z1231 Encounter for screening mammogram for malignant neoplasm of breast: Secondary | ICD-10-CM

## 2017-06-18 DIAGNOSIS — R928 Other abnormal and inconclusive findings on diagnostic imaging of breast: Secondary | ICD-10-CM | POA: Insufficient documentation

## 2017-06-18 DIAGNOSIS — H209 Unspecified iridocyclitis: Secondary | ICD-10-CM | POA: Diagnosis not present

## 2017-06-19 ENCOUNTER — Other Ambulatory Visit: Payer: Self-pay | Admitting: Internal Medicine

## 2017-06-19 DIAGNOSIS — R928 Other abnormal and inconclusive findings on diagnostic imaging of breast: Secondary | ICD-10-CM

## 2017-06-19 NOTE — Progress Notes (Signed)
Order placed for f/u left breast mammogram and ultrasound.   

## 2017-06-20 ENCOUNTER — Ambulatory Visit: Payer: Medicare Other

## 2017-06-23 ENCOUNTER — Other Ambulatory Visit: Payer: Self-pay | Admitting: Internal Medicine

## 2017-06-27 ENCOUNTER — Other Ambulatory Visit: Payer: Self-pay

## 2017-06-27 ENCOUNTER — Ambulatory Visit
Admission: RE | Admit: 2017-06-27 | Discharge: 2017-06-27 | Disposition: A | Discharge status: Home / Self Care | Payer: Medicare Other | Source: Ambulatory Visit | Attending: Internal Medicine | Admitting: Internal Medicine

## 2017-06-27 ENCOUNTER — Encounter: Payer: Self-pay | Admitting: Cardiovascular Disease

## 2017-06-27 ENCOUNTER — Ambulatory Visit (INDEPENDENT_AMBULATORY_CARE_PROVIDER_SITE_OTHER): Payer: Medicare Other | Admitting: Cardiovascular Disease

## 2017-06-27 VITALS — BP 130/78 | HR 93 | Ht 62.0 in | Wt 134.2 lb

## 2017-06-27 DIAGNOSIS — I481 Persistent atrial fibrillation: Secondary | ICD-10-CM

## 2017-06-27 DIAGNOSIS — I2510 Atherosclerotic heart disease of native coronary artery without angina pectoris: Secondary | ICD-10-CM

## 2017-06-27 DIAGNOSIS — I10 Essential (primary) hypertension: Secondary | ICD-10-CM | POA: Diagnosis not present

## 2017-06-27 DIAGNOSIS — R928 Other abnormal and inconclusive findings on diagnostic imaging of breast: Secondary | ICD-10-CM

## 2017-06-27 DIAGNOSIS — I5032 Chronic diastolic (congestive) heart failure: Secondary | ICD-10-CM | POA: Diagnosis not present

## 2017-06-27 DIAGNOSIS — I4819 Other persistent atrial fibrillation: Secondary | ICD-10-CM

## 2017-06-27 DIAGNOSIS — R922 Inconclusive mammogram: Secondary | ICD-10-CM | POA: Diagnosis not present

## 2017-06-27 MED ORDER — METOPROLOL TARTRATE 100 MG PO TABS: 100.0000 mg | ORAL_TABLET | Freq: Two times a day (BID) | ORAL | 3 refills | Status: DC

## 2017-06-27 MED ORDER — METOPROLOL SUCCINATE ER 100 MG PO TB24: 100.0000 mg | ORAL_TABLET | Freq: Two times a day (BID) | ORAL | 3 refills | Status: DC

## 2017-06-27 NOTE — Patient Instructions (Signed)
Medication Instructions:  Your physician has recommended you make the following change in your medication:  STOP taking amiodarone STOP taking amlodipine INCREASE metoprolol to 100mg  twice daily   Labwork: none  Testing/Procedures: none  Follow-Up: Your physician recommends that you schedule a follow-up appointment in: 1 month with Dr. Fletcher Anon.    Any Other Special Instructions Will Be Listed Below (If Applicable).     If you need a refill on your cardiac medications before your next appointment, please call your pharmacy.

## 2017-06-27 NOTE — Progress Notes (Signed)
Cardiology Office Note   Date:  06/27/2017   ID:  Ruth Roth, DOB July 05, 1928, MRN [id number]  PCP:  Einar Pheasant, MD  Cardiologist:   [name], MD   Chief Complaint  Patient presents with  . OTHER    Cardioversion c/o not feeling well c/o rapid heart and sob. Meds reviewed verbally with pt.      History of Present Illness: Ruth Roth is a 83 y.o. female who presents for a follow-up visit regarding coronary artery disease ,chronic diastolic heart failure and persistent atrial fibrillation .  She has known history of hypertension, peptic ulcer disease, fibrocystic breast disease, inflammatory arthritis, autoimmune hepatitis and neuropathy.  She had a small non-ST elevation myocardial infarction in March 2016 with acute diastolic heart failure after a GI illness.  Echocardiogram showed normal LV systolic function. Cardiac catheterization showed showed 60% ostial left circumflex hazy stenosis which was possibly the culprit. Mild LAD/RCA disease. She was treated medically.   She had cardioversion 3 times since October due to recurrent highly symptomatic atrial fibrillation.  Most recently was on April 1 after reloading with amiodarone.  She felt that she was in sinus rhythm for only 2 days and then developed recurrent palpitations and dyspnea.  No chest pain.  Past Medical History:  Diagnosis Date  . Atrial fibrillation ([place])   . Autoimmune hepatitis ([place])    followed by Dr [name]  . CHF (congestive heart failure) ([place])   . Coronary artery disease    Non-ST elevation myocardial infarction in March 2016. Cardiac catheterization showed 60% ostial left circumflex hazy stenosis which was possibly the culprit. Mild LAD/RCA disease. EF 60% by echo.  . Fibrocystic breast disease   . Hypercholesterolemia   . Hypertension   . Inflammatory arthritis   . MI (myocardial infarction) ([place])   . Neuropathy   . Osteoarthritis   . 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 Thyroid disease     Past Surgical History:  Procedure Laterality Date  . ABDOMINAL HYSTERECTOMY  1963   partial, secondary to fibroids  . APPENDECTOMY    . Billroth    . CARDIAC CATHETERIZATION  05/12/14   [facility]  . CARDIOVERSION N/A 12/26/2016   Procedure: CARDIOVERSION;  Surgeon: [name], MD;  Location: [facility] ORS;  Service: Cardiovascular;  Laterality: N/A;  . CARDIOVERSION N/A 05/16/2017   Procedure: CARDIOVERSION;  Surgeon: [name], MD;  Location: [facility] ORS;  Service: Cardiovascular;  Laterality: N/A;  . CARDIOVERSION N/A 06/09/2017   Procedure: CARDIOVERSION;  Surgeon: [name], MD;  Location: [facility] ORS;  Service: Cardiovascular;  Laterality: N/A;  . CHOLECYSTECTOMY  1998  . COLONOSCOPY  2009  . UPPER GI ENDOSCOPY  2004     Current Outpatient Medications  Medication Sig Dispense Refill  . amiodarone (PACERONE) 200 MG tablet Take 1 tablet (200 mg total) by mouth daily.    Marland Kitchen amLODipine (NORVASC) 5 MG tablet Take 5 mg by mouth at bedtime.     . brimonidine-timolol (COMBIGAN) 0.2-0.5 % ophthalmic solution Place 1 drop into the left eye 2 (two) times daily.     . dorzolamide (TRUSOPT) 2 % ophthalmic solution Place 1 drop into the left eye 2 (two) times daily.    . fluticasone (FLONASE) 50 MCG/ACT nasal spray Place 1-2 sprays into both nostrils daily as needed (for allergies.).     Marland Kitchen furosemide (LASIX) 20 MG tablet Take 1 tablet (  20 mg total) by mouth every other day. 45 tablet 3  . gabapentin (NEURONTIN) 300 MG capsule TAKE 2 CAPSULES BY MOUTH FOUR TIMES DAILY (Patient taking differently: Take 300 mg by mouth in the morning, take 300 mg by mouth at lunch and take 600 mg by mouth at supper) 720 capsule 0  . ipratropium (ATROVENT) 0.06 % nasal spray Place 2 sprays into both nostrils daily as needed for rhinitis.     Marland Kitchen latanoprost (XALATAN) 0.005 % ophthalmic solution Place 1 drop into both eyes at bedtime.     Marland Kitchen  levothyroxine (SYNTHROID, LEVOTHROID) 112 MCG tablet TAKE 1 TABLET BY MOUTH EVERY DAY BEFORE BREAKFAST 90 tablet 0  . metoprolol tartrate (LOPRESSOR) 50 MG tablet Take 50 mg by mouth 2 (two) times daily.    . pantoprazole (PROTONIX) 40 MG tablet Take 1 tablet (40 mg total) by mouth daily. (Patient taking differently: Take 40 mg by mouth every evening. ) 90 tablet 1  . rivaroxaban (XARELTO) 20 MG TABS tablet Take 1 tablet (20 mg total) by mouth daily with supper. 30 tablet 5  . traMADol (ULTRAM) 50 MG tablet Take 1 tablet (50 mg total) by mouth 3 (three) times daily as needed (for pain.). 90 tablet 2  . ursodiol (ACTIGALL) 300 MG capsule Take 300-600 mg by mouth 2 (two) times daily. Take 2 capsules (600 mg) daily after lunch & 1 capsule (300 mg) at night.     No current facility-administered medications for this visit.     Allergies:   Statins; Haldol [haloperidol lactate]; Librax [chlordiazepoxide-clidinium]; Plavix [clopidogrel bisulfate]; and Ramipril    Social History:  The patient  reports that she has never smoked. She has never used smokeless tobacco. She reports that she does not drink alcohol or use drugs.   Family History:  The patient's family history includes Cancer in her daughter; Esophageal cancer in her brother; Ovarian cancer in her daughter; Stroke in her mother.    ROS:  Please see the history of present illness.   Otherwise, review of systems are positive for none.   All other systems are reviewed and negative.    PHYSICAL EXAM: VS:  BP 130/78 (BP Location: Left Arm, Patient Position: Sitting, Cuff Size: Normal)   Pulse 93   Ht 5\' 2"  (1.575 m)   Wt 134 lb 4 oz (60.9 kg)   BMI 24.55 kg/m  , BMI Body mass index is 24.55 kg/m. GEN: Well nourished, well developed, in no acute distress  HEENT: normal  Neck: no JVD, carotid bruits, or masses Cardiac: Irregularly irregular; no rubs, or gallops,no edema . 1/ 6 systolic ejection murmur at the aortic area Respiratory:  clear  to auscultation bilaterally, normal work of breathing GI: soft, nontender, nondistended, + BS MS: no deformity or atrophy  Skin: warm and dry, no rash Neuro:  Strength and sensation are intact Psych: euthymic mood, full affect   EKG:  EKG is ordered today. The ekg ordered today demonstrates atrial fibrillation with nonspecific ST changes.  Ventricular rate is 93 bpm.   Recent Labs: 05/26/2017: Hemoglobin 11.0; Platelets 272; TSH 2.310 05/27/2017: ALT 17; BUN 19; Creatinine, Ser 1.13; Potassium 4.9; Sodium 139    Lipid Panel    Component Value Date/Time   CHOL 159 05/27/2017 0959   TRIG 101.0 05/27/2017 0959   HDL 64.30 05/27/2017 0959   CHOLHDL 2 05/27/2017 0959   VLDL 20.2 05/27/2017 0959   LDLCALC 74 05/27/2017 0959   LDLDIRECT 143.5 12/17/2012 0955  Wt Readings from Last 3 Encounters:  06/27/17 134 lb 4 oz (60.9 kg)  06/09/17 133 lb (60.3 kg)  06/05/17 133 lb 4 oz (60.4 kg)       ASSESSMENT AND PLAN:  1.  Persistent atrial fibrillation:   She is not maintaining in sinus rhythm in spite of amiodarone.  Thus, I recommend rate control.  I discontinued amiodarone and amlodipine.  I increase metoprolol to 100 mg twice daily.  Recheck symptoms in 1 month.  If no improvement, we can consider adding small dose diltiazem if blood pressure allows or referral to the A. fib clinic for further options.  2. Coronary artery disease involving native coronary arteries without angina:  Continue medical therapy.  3. Chronic diastolic heart failure: She appears to be euvolemic.  4. Essential hypertension: Blood pressure is controlled on current medications.  I stopped amlodipine in order to up titrate metoprolol.  5. Hyperlipidemia: Not on a statin due to history of autoimmune hepatitis. Most recent LDL was 138.  6.  Moderate to severe mitral regurgitation: Likely contributing to atrial fibrillation.  Recheck this once ventricular rate is more controlled.   Disposition:   FU  with me in 1 month.  Signed,  [name], MD  06/27/2017 2:58 PM    [place]

## 2017-07-02 DIAGNOSIS — H209 Unspecified iridocyclitis: Secondary | ICD-10-CM | POA: Diagnosis not present

## 2017-07-08 ENCOUNTER — Ambulatory Visit: Payer: Medicare Other | Admitting: Cardiovascular Disease

## 2017-07-15 ENCOUNTER — Telehealth: Payer: Self-pay | Admitting: Cardiovascular Disease

## 2017-07-15 NOTE — Telephone Encounter (Signed)
Form faxed

## 2017-07-15 NOTE — Telephone Encounter (Signed)
Dr. Fletcher Anon signed [employer] form and faxed to number listed.

## 2017-07-15 NOTE — Telephone Encounter (Signed)
Received fax from [place] requesting diagnosis history and physician signature. Will place form in Dr. Tyrell Antonio in-basket for his review.

## 2017-07-25 ENCOUNTER — Encounter: Payer: Self-pay | Admitting: Cardiovascular Disease

## 2017-07-25 ENCOUNTER — Ambulatory Visit (INDEPENDENT_AMBULATORY_CARE_PROVIDER_SITE_OTHER): Payer: Medicare Other | Admitting: Cardiovascular Disease

## 2017-07-25 VITALS — BP 140/80 | HR 82 | Ht 62.0 in | Wt 135.0 lb

## 2017-07-25 DIAGNOSIS — I340 Nonrheumatic mitral (valve) insufficiency: Secondary | ICD-10-CM

## 2017-07-25 DIAGNOSIS — E785 Hyperlipidemia, unspecified: Secondary | ICD-10-CM

## 2017-07-25 DIAGNOSIS — I481 Persistent atrial fibrillation: Secondary | ICD-10-CM

## 2017-07-25 DIAGNOSIS — I2510 Atherosclerotic heart disease of native coronary artery without angina pectoris: Secondary | ICD-10-CM

## 2017-07-25 DIAGNOSIS — I5032 Chronic diastolic (congestive) heart failure: Secondary | ICD-10-CM

## 2017-07-25 DIAGNOSIS — I4819 Other persistent atrial fibrillation: Secondary | ICD-10-CM

## 2017-07-25 MED ORDER — FUROSEMIDE 20 MG PO TABS: 20.0000 mg | ORAL_TABLET | Freq: Every day | ORAL | 3 refills | Status: DC

## 2017-07-25 NOTE — Progress Notes (Signed)
Cardiology Office Note   Date:  07/25/2017   ID:  Ruth Roth, DOB 1928/07/05, MRN [id number]  PCP:  Einar Pheasant, MD  Cardiologist:   [name], MD   Chief Complaint  Patient presents with  . other    1 month follow up. Meds reviewed by the pt. verbally. Pt. c/o shortness of breath and fatigue.       History of Present Illness: Ruth Roth is a 84 y.o. female who presents for a follow-up visit regarding coronary artery disease ,chronic diastolic heart failure and persistent atrial fibrillation .  She has known history of hypertension, peptic ulcer disease, fibrocystic breast disease, inflammatory arthritis, autoimmune hepatitis and neuropathy.  She had a small non-ST elevation myocardial infarction in March 2016 with acute diastolic heart failure after a GI illness.  Echocardiogram showed normal LV systolic function. Cardiac catheterization showed showed 60% ostial left circumflex hazy stenosis which was possibly the culprit. Mild LAD/RCA disease. She was treated medically.   She had cardioversion 3 times since October due to recurrent highly symptomatic atrial fibrillation.  Most recently was on April 1 after reloading with amiodarone.  I discontinued amiodarone during last visit for that reason and increase metoprolol to 100 mg twice daily.  She reports improvement in palpitations but she continues to complain of dyspnea with minimal activities with fatigue and inability to perform activities of daily living.  Past Medical History:  Diagnosis Date  . Atrial fibrillation ([place])   . Autoimmune hepatitis ([place])    followed by Dr Ruth Roth  . CHF (congestive heart failure) ([place])   . Coronary artery disease    Non-ST elevation myocardial infarction in March 2016. Cardiac catheterization showed 60% ostial left circumflex hazy stenosis which was possibly the culprit. Mild LAD/RCA disease. EF 60% by echo.  . Fibrocystic breast disease   . Hypercholesterolemia   . Hypertension    . Inflammatory arthritis   . MI (myocardial infarction) ([place])   . Neuropathy   . Osteoarthritis   . 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 Thyroid disease     Past Surgical History:  Procedure Laterality Date  . ABDOMINAL HYSTERECTOMY  1963   partial, secondary to fibroids  . APPENDECTOMY    . Billroth    . CARDIAC CATHETERIZATION  05/12/14   [facility]  . CARDIOVERSION N/A 12/26/2016   Procedure: CARDIOVERSION;  Surgeon: Ruth Hampshire, MD;  Location: [facility] ORS;  Service: Cardiovascular;  Laterality: N/A;  . CARDIOVERSION N/A 05/16/2017   Procedure: CARDIOVERSION;  Surgeon: Ruth Hampshire, MD;  Location: [facility] ORS;  Service: Cardiovascular;  Laterality: N/A;  . CARDIOVERSION N/A 06/09/2017   Procedure: CARDIOVERSION;  Surgeon: Ruth Hampshire, MD;  Location: [facility] ORS;  Service: Cardiovascular;  Laterality: N/A;  . CHOLECYSTECTOMY  1998  . COLONOSCOPY  2009  . UPPER GI ENDOSCOPY  2004     Current Outpatient Medications  Medication Sig Dispense Refill  . brimonidine-timolol (COMBIGAN) 0.2-0.5 % ophthalmic solution Place 1 drop into the left eye 2 (two) times daily.     . dorzolamide (TRUSOPT) 2 % ophthalmic solution Place 1 drop into the left eye 2 (two) times daily.    . fluticasone (FLONASE) 50 MCG/ACT nasal spray Place 1-2 sprays into both nostrils daily as needed (for allergies.).     Marland Kitchen furosemide (LASIX) 20 MG tablet Take 1 tablet (20 mg total) by mouth every other day. 45 tablet 3  .  gabapentin (NEURONTIN) 300 MG capsule TAKE 2 CAPSULES BY MOUTH FOUR TIMES DAILY (Patient taking differently: Take 300 mg by mouth in the morning, take 300 mg by mouth at lunch and take 600 mg by mouth at supper) 720 capsule 0  . ipratropium (ATROVENT) 0.06 % nasal spray Place 2 sprays into both nostrils daily as needed for rhinitis.     Marland Kitchen latanoprost (XALATAN) 0.005 % ophthalmic solution Place 1 drop into both eyes at bedtime.     Marland Kitchen  levothyroxine (SYNTHROID, LEVOTHROID) 112 MCG tablet TAKE 1 TABLET BY MOUTH EVERY DAY BEFORE BREAKFAST 90 tablet 0  . metoprolol tartrate (LOPRESSOR) 100 MG tablet Take 1 tablet (100 mg total) by mouth 2 (two) times daily. 180 tablet 3  . pantoprazole (PROTONIX) 40 MG tablet Take 1 tablet (40 mg total) by mouth daily. (Patient taking differently: Take 40 mg by mouth every evening. ) 90 tablet 1  . rivaroxaban (XARELTO) 20 MG TABS tablet Take 1 tablet (20 mg total) by mouth daily with supper. 30 tablet 5  . traMADol (ULTRAM) 50 MG tablet Take 1 tablet (50 mg total) by mouth 3 (three) times daily as needed (for pain.). 90 tablet 2  . ursodiol (ACTIGALL) 300 MG capsule Take 300-600 mg by mouth 2 (two) times daily. Take 2 capsules (600 mg) daily after lunch & 1 capsule (300 mg) at night.     No current facility-administered medications for this visit.     Allergies:   Statins; Haldol [haloperidol lactate]; Librax [chlordiazepoxide-clidinium]; Plavix [clopidogrel bisulfate]; and Ramipril    Social History:  The patient  reports that she has never smoked. She has never used smokeless tobacco. She reports that she does not drink alcohol or use drugs.   Family History:  The patient's family history includes Cancer in her daughter; Esophageal cancer in her brother; Ovarian cancer in her daughter; Stroke in her mother.    ROS:  Please see the history of present illness.   Otherwise, review of systems are positive for none.   All other systems are reviewed and negative.    PHYSICAL EXAM: VS:  BP 140/80 (BP Location: Left Arm, Patient Position: Sitting, Cuff Size: Normal)   Pulse 82   Ht 5\' 2"  (1.575 m)   Wt 135 lb (61.2 kg)   BMI 24.69 kg/m  , BMI Body mass index is 24.69 kg/m. GEN: Well nourished, well developed, in no acute distress  HEENT: normal  Neck: Mild JVD, carotid bruits, or masses Cardiac: Irregularly irregular; no rubs, or gallops,no edema . 1/ 6 systolic ejection murmur at the  aortic area.  Mild bilateral leg edema Respiratory:  clear to auscultation bilaterally, normal work of breathing GI: soft, nontender, nondistended, + BS MS: no deformity or atrophy  Skin: warm and dry, no rash Neuro:  Strength and sensation are intact Psych: euthymic mood, full affect   EKG:  EKG is ordered today. The ekg ordered today demonstrates atrial fibrillation with ventricular rate of 82 bpm.  Nonspecific ST and T wave changes.   Recent Labs: 05/26/2017: Hemoglobin 11.0; Platelets 272; TSH 2.310 05/27/2017: ALT 17; BUN 19; Creatinine, Ser 1.13; Potassium 4.9; Sodium 139    Lipid Panel    Component Value Date/Time   CHOL 159 05/27/2017 0959   TRIG 101.0 05/27/2017 0959   HDL 64.30 05/27/2017 0959   CHOLHDL 2 05/27/2017 0959   VLDL 20.2 05/27/2017 0959   LDLCALC 74 05/27/2017 0959   LDLDIRECT 143.5 12/17/2012 0955  Wt Readings from Last 3 Encounters:  07/25/17 135 lb (61.2 kg)  06/27/17 134 lb 4 oz (60.9 kg)  06/09/17 133 lb (60.3 kg)       ASSESSMENT AND PLAN:  1.  Persistent atrial fibrillation:   She failed to maintain sinus rhythm even with amiodarone.  She is currently being treated with rate control.  Ventricular rate improved with increasing the dose of metoprolol.  However, she continues to complain of exertional dyspnea and fatigue.   It is not entirely clear of all her symptoms are related to A. fib or some other etiology.  Continue rate control for now.  I am going to repeat her echocardiogram now that ventricular rate is controlled.  We can evaluate atrial size and degree of mitral regurgitation and consider further management options and possibly referral to the A. fib clinic.  However, considering her age and comorbidities, she might not be a good candidate for advanced treatment.  2. Coronary artery disease involving native coronary arteries without angina:  Continue medical therapy.  3. Chronic diastolic heart failure: She appears to be mildly  volume overloaded which might be contributing to her shortness of breath.  I increased furosemide to 20 mg daily instead of every other day.  Check basic metabolic profile in 1 week.  4. Essential hypertension: Blood pressure is controlled on current medications.   5. Hyperlipidemia: Not on a statin due to history of autoimmune hepatitis. Most recent LDL was 138.  6.  Moderate to severe mitral regurgitation: Likely contributing to atrial fibrillation.   I requested a follow-up echocardiogram.  Disposition:   FU with me in 2 month.  Signed,  [name], MD  07/25/2017 11:41 AM    [place]

## 2017-07-25 NOTE — Patient Instructions (Signed)
Medication Instructions:  Your physician has recommended you make the following change in your medication:  1. START Furosemide 20 mg once daily   Labwork: BMET when you come in for your Echocardiogram  Testing/Procedures: Your physician has requested that you have an echocardiogram. Echocardiography is a painless test that uses sound waves to create images of your heart. It provides your doctor with information about the size and shape of your heart and how well your heart's chambers and valves are working. This procedure takes approximately one hour. There are no restrictions for this procedure.    Follow-Up: Your physician recommends that you schedule a follow-up appointment in: 2 months with Dr. Fletcher Anon.  It was a pleasure seeing you today here in the office. Please do not hesitate to give [place] a call back if you have any further questions. [place], BSN

## 2017-07-27 ENCOUNTER — Other Ambulatory Visit: Payer: Self-pay | Admitting: Cardiovascular Disease

## 2017-07-28 NOTE — Telephone Encounter (Signed)
Pt is currently taking Xarelto 20 mg once daily for persistent afib. Pt is 89, wt 61.2 kg, serum creatinine 1.13, CrCl of 33.89. Per protocol, CrCl 15-50 should be on Xarelto 15 mg once daily. Are you agreeable to decreasing her dosage?

## 2017-07-28 NOTE — Telephone Encounter (Signed)
Yes let's decrease to 15 mg daily. Thanks.

## 2017-07-28 NOTE — Telephone Encounter (Signed)
Please review for refill, Thanks !  

## 2017-08-01 ENCOUNTER — Other Ambulatory Visit (INDEPENDENT_AMBULATORY_CARE_PROVIDER_SITE_OTHER): Payer: Medicare Other

## 2017-08-01 ENCOUNTER — Ambulatory Visit (INDEPENDENT_AMBULATORY_CARE_PROVIDER_SITE_OTHER): Payer: Medicare Other

## 2017-08-01 ENCOUNTER — Other Ambulatory Visit: Payer: Self-pay

## 2017-08-01 DIAGNOSIS — I4819 Other persistent atrial fibrillation: Secondary | ICD-10-CM

## 2017-08-01 DIAGNOSIS — I481 Persistent atrial fibrillation: Secondary | ICD-10-CM | POA: Diagnosis not present

## 2017-08-01 DIAGNOSIS — I340 Nonrheumatic mitral (valve) insufficiency: Secondary | ICD-10-CM

## 2017-08-02 LAB — BASIC METABOLIC PANEL
BUN / CREAT RATIO: 22 (ref 12–28)
BUN: 38 mg/dL — AB (ref 8–27)
CHLORIDE: 107 mmol/L — AB (ref 96–106)
CO2: 17 mmol/L — AB (ref 20–29)
CREATININE: 1.76 mg/dL — AB (ref 0.57–1.00)
Calcium: 8.5 mg/dL — ABNORMAL LOW (ref 8.7–10.3)
GFR calc non Af Amer: 26 mL/min/{1.73_m2} — ABNORMAL LOW (ref >59)
GFR, EST AFRICAN AMERICAN: 30 mL/min/{1.73_m2} — AB (ref >59)
Glucose: 97 mg/dL (ref 65–99)
POTASSIUM: 4.7 mmol/L (ref 3.5–5.2)
Sodium: 139 mmol/L (ref 134–144)

## 2017-08-05 ENCOUNTER — Telehealth: Payer: Self-pay | Admitting: Cardiovascular Disease

## 2017-08-05 MED ORDER — FUROSEMIDE 20 MG PO TABS: ORAL_TABLET | ORAL | Status: DC

## 2017-08-05 NOTE — Telephone Encounter (Signed)
Notes recorded by Emily Filbert, RN on 08/05/2017 at 12:12 PM EDT The patient is aware of her results and Dr. Tyrell Antonio recommendations to hold lasix x 2 days then resume at 20 mg once every other day. The patient verbalizes understanding. ------  Notes recorded by [name], MD on 08/04/2017 at 2:17 PM EDT Inform patient that renal function is worse. Hold furosemide for 2 days then resume at 20 mg every other day.

## 2017-08-06 NOTE — Telephone Encounter (Signed)
Left message for pt to call back if she would like to discuss why her Xarelto was decreased from 20 mg to 15 mg.

## 2017-08-08 ENCOUNTER — Ambulatory Visit: Payer: Medicare Other | Admitting: Cardiovascular Disease

## 2017-08-19 ENCOUNTER — Encounter: Payer: Self-pay | Admitting: Cardiovascular Disease

## 2017-08-19 ENCOUNTER — Ambulatory Visit (INDEPENDENT_AMBULATORY_CARE_PROVIDER_SITE_OTHER): Payer: Medicare Other | Admitting: Cardiovascular Disease

## 2017-08-19 VITALS — BP 122/70 | HR 76 | Ht 62.0 in | Wt 147.0 lb

## 2017-08-19 DIAGNOSIS — I481 Persistent atrial fibrillation: Secondary | ICD-10-CM

## 2017-08-19 DIAGNOSIS — I25118 Atherosclerotic heart disease of native coronary artery with other forms of angina pectoris: Secondary | ICD-10-CM | POA: Diagnosis not present

## 2017-08-19 DIAGNOSIS — I2510 Atherosclerotic heart disease of native coronary artery without angina pectoris: Secondary | ICD-10-CM

## 2017-08-19 DIAGNOSIS — I4819 Other persistent atrial fibrillation: Secondary | ICD-10-CM

## 2017-08-19 DIAGNOSIS — I340 Nonrheumatic mitral (valve) insufficiency: Secondary | ICD-10-CM | POA: Diagnosis not present

## 2017-08-19 DIAGNOSIS — Z01818 Encounter for other preprocedural examination: Secondary | ICD-10-CM | POA: Diagnosis not present

## 2017-08-19 DIAGNOSIS — I272 Pulmonary hypertension, unspecified: Secondary | ICD-10-CM

## 2017-08-19 NOTE — Progress Notes (Signed)
Cardiology Office Note   Date:  08/19/2017   ID:  Ruth Roth, DOB October 04, 1928, MRN [id number]  PCP:  Einar Pheasant, MD  Cardiologist:   [name], MD   Chief Complaint  Patient presents with  . other    Patient would like to discuss possible L/R heart cath. Meds reviewed verbally with patient. Pt. c/o Le edema and shortness of breath with little to no exertion.       History of Present Illness: Ruth Roth is a 84 y.o. female who presents for a follow-up visit regarding coronary artery disease ,chronic diastolic heart failure and persistent atrial fibrillation .  She has known history of hypertension, peptic ulcer disease, fibrocystic breast disease, inflammatory arthritis, autoimmune hepatitis and neuropathy.  She had a small non-ST elevation myocardial infarction in March 2016 with acute diastolic heart failure after a GI illness.  Echocardiogram showed normal LV systolic function. Cardiac catheterization showed showed 60% ostial left circumflex hazy stenosis which was possibly the culprit. Mild LAD/RCA disease. She was treated medically.   She had cardioversion 3 times since October due to recurrent highly symptomatic atrial fibrillation.  Most recently was on April 1 after reloading with amiodarone.  Due to recurrent atrial fibrillation, I elected for rate control and discontinued amiodarone.  Rate control has been achieved with metoprolol but in spite of that she has been having worsening exertional dyspnea with minimal activities.  Recent echocardiogram showed an EF of 50 to 55%, mild aortic stenosis, moderate mitral regurgitation and severe pulmonary hypertension with estimated systolic pulmonary pressure of 65 mmHg.  I attempted increasing her diuretic but that was associated with worsening renal function.  She denies any chest pain.  She had a recent 10 pound weight gain with significant leg edema.  Past Medical History:  Diagnosis Date  . Atrial fibrillation ([place])    . Autoimmune hepatitis ([place])    followed by Dr [name]  . CHF (congestive heart failure) ([place])   . Coronary artery disease    Non-ST elevation myocardial infarction in March 2016. Cardiac catheterization showed 60% ostial left circumflex hazy stenosis which was possibly the culprit. Mild LAD/RCA disease. EF 60% by echo.  . Fibrocystic breast disease   . Hypercholesterolemia   . Hypertension   . Inflammatory arthritis   . MI (myocardial infarction) ([place])   . Neuropathy   . Osteoarthritis   . 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 Thyroid disease     Past Surgical History:  Procedure Laterality Date  . ABDOMINAL HYSTERECTOMY  1963   partial, secondary to fibroids  . APPENDECTOMY    . Billroth    . CARDIAC CATHETERIZATION  05/12/14   [facility]  . CARDIOVERSION N/A 12/26/2016   Procedure: CARDIOVERSION;  Surgeon: [name], MD;  Location: [facility] ORS;  Service: Cardiovascular;  Laterality: N/A;  . CARDIOVERSION N/A 05/16/2017   Procedure: CARDIOVERSION;  Surgeon: [name], MD;  Location: [facility] ORS;  Service: Cardiovascular;  Laterality: N/A;  . CARDIOVERSION N/A 06/09/2017   Procedure: CARDIOVERSION;  Surgeon: [name], MD;  Location: [facility] ORS;  Service: Cardiovascular;  Laterality: N/A;  . CHOLECYSTECTOMY  1998  . COLONOSCOPY  2009  . UPPER GI ENDOSCOPY  2004     Current Outpatient Medications  Medication Sig Dispense Refill  . brimonidine-timolol (COMBIGAN) 0.2-0.5 % ophthalmic solution Place 1 drop into the left eye 2 (two) times daily.     Marland Kitchen  dorzolamide (TRUSOPT) 2 % ophthalmic solution Place 1 drop into the left eye 2 (two) times daily.    . fluticasone (FLONASE) 50 MCG/ACT nasal spray Place 1-2 sprays into both nostrils daily as needed (for allergies.).     Marland Kitchen furosemide (LASIX) 20 MG tablet Take 1 tablet (20 mg) by mouth once every other day    . gabapentin (NEURONTIN) 300 MG capsule TAKE 2 CAPSULES BY  MOUTH FOUR TIMES DAILY (Patient taking differently: Take 300 mg by mouth in the morning, take 300 mg by mouth at lunch and take 600 mg by mouth at supper) 720 capsule 0  . ipratropium (ATROVENT) 0.06 % nasal spray Place 2 sprays into both nostrils daily as needed for rhinitis.     Marland Kitchen latanoprost (XALATAN) 0.005 % ophthalmic solution Place 1 drop into both eyes at bedtime.     Marland Kitchen levothyroxine (SYNTHROID, LEVOTHROID) 112 MCG tablet TAKE 1 TABLET BY MOUTH EVERY DAY BEFORE BREAKFAST 90 tablet 0  . metoprolol tartrate (LOPRESSOR) 100 MG tablet Take 1 tablet (100 mg total) by mouth 2 (two) times daily. 180 tablet 3  . pantoprazole (PROTONIX) 40 MG tablet Take 1 tablet (40 mg total) by mouth daily. (Patient taking differently: Take 40 mg by mouth every evening. ) 90 tablet 1  . Rivaroxaban (XARELTO) 15 MG TABS tablet Take 1 tablet (15 mg total) by mouth daily with supper. 30 tablet 6  . traMADol (ULTRAM) 50 MG tablet Take 1 tablet (50 mg total) by mouth 3 (three) times daily as needed (for pain.). 90 tablet 2  . ursodiol (ACTIGALL) 300 MG capsule Take 300-600 mg by mouth 2 (two) times daily. Take 2 capsules (600 mg) daily after lunch & 1 capsule (300 mg) at night.     No current facility-administered medications for this visit.     Allergies:   Statins; Haldol [haloperidol lactate]; Librax [chlordiazepoxide-clidinium]; Plavix [clopidogrel bisulfate]; and Ramipril    Social History:  The patient  reports that she has never smoked. She has never used smokeless tobacco. She reports that she does not drink alcohol or use drugs.   Family History:  The patient's family history includes Cancer in her daughter; Esophageal cancer in her brother; Ovarian cancer in her daughter; Stroke in her mother.    ROS:  Please see the history of present illness.   Otherwise, review of systems are positive for none.   All other systems are reviewed and negative.    PHYSICAL EXAM: VS:  BP 122/70 (BP Location: Left Arm,  Patient Position: Sitting, Cuff Size: Normal)   Pulse 76   Ht 5\' 2"  (1.575 m)   Wt 147 lb (66.7 kg)   BMI 26.89 kg/m  , BMI Body mass index is 26.89 kg/m. GEN: Well nourished, well developed, in no acute distress  HEENT: normal  Neck: Mild JVD, carotid bruits, or masses Cardiac: Irregularly irregular; no rubs, or gallops,no edema . 1/ 6 systolic ejection murmur at the aortic area.  Moderate to severe bilateral leg edema Respiratory:  clear to auscultation bilaterally, normal work of breathing GI: soft, nontender, nondistended, + BS MS: no deformity or atrophy  Skin: warm and dry, no rash Neuro:  Strength and sensation are intact Psych: euthymic mood, full affect   EKG:  EKG is ordered today. The ekg ordered today demonstrates atrial fibrillation with ventricular rate of 76 bpm and nonspecific ST and T wave changes.   Recent Labs: 05/26/2017: Hemoglobin 11.0; Platelets 272; TSH 2.310 05/27/2017: ALT 17 08/01/2017: BUN  38; Creatinine, Ser 1.76; Potassium 4.7; Sodium 139    Lipid Panel    Component Value Date/Time   CHOL 159 05/27/2017 0959   TRIG 101.0 05/27/2017 0959   HDL 64.30 05/27/2017 0959   CHOLHDL 2 05/27/2017 0959   VLDL 20.2 05/27/2017 0959   LDLCALC 74 05/27/2017 0959   LDLDIRECT 143.5 12/17/2012 0955      Wt Readings from Last 3 Encounters:  08/19/17 147 lb (66.7 kg)  07/25/17 135 lb (61.2 kg)  06/27/17 134 lb 4 oz (60.9 kg)       ASSESSMENT AND PLAN:  1.  Persistent atrial fibrillation:   She failed to maintain sinus rhythm even with amiodarone.  Ventricular rate is well controlled with metoprolol and she is tolerating anticoagulation with Xarelto.    2. Coronary artery disease involving native coronary arteries with other forms of angina.  I am concerned that her dyspnea with minimal activities might be due to progression of coronary artery disease.  Given severity of her symptoms, I recommend proceeding with a left and right cardiac catheterization.   The purpose is to evaluate for progression of coronary artery disease as well as evaluation of volume status, mitral regurgitation and pulmonary hypertension.  Her echo is suggestive of significant volume overload and clinically she also appears to be volume overloaded.  However, every attempt of diuresis have been associated with worsening renal function.  We need to determine if mitral regurgitation is hemodynamically significant and if so we might need to proceed with a TEE for evaluation.  3. Chronic diastolic heart failure: Continue small dose furosemide for now until hemodynamic evaluation of the right heart catheterization as outlined above.  4. Essential hypertension: Blood pressure is controlled on current medications.   5. Hyperlipidemia: Not on a statin due to history of autoimmune hepatitis. Most recent LDL was 138.  6.  Moderate to severe mitral regurgitation: This was moderate and most recent echocardiogram but will be evaluated with cardiac cath.  Disposition:   FU with me in 1 month.  Signed,  [name], MD  08/19/2017 1:47 PM    [place]

## 2017-08-19 NOTE — H&P (View-Only) (Signed)
Cardiology Office Note   Date:  08/19/2017   ID:  Ruth Roth, DOB 1928-10-25, MRN [id number]  PCP:  Einar Pheasant, MD  Cardiologist:   [name], MD   Chief Complaint  Patient presents with  . other    Patient would like to discuss possible L/R heart cath. Meds reviewed verbally with patient. Pt. c/o Le edema and shortness of breath with little to no exertion.       History of Present Illness: Ruth Roth is a 84 y.o. female who presents for a follow-up visit regarding coronary artery disease ,chronic diastolic heart failure and persistent atrial fibrillation .  She has known history of hypertension, peptic ulcer disease, fibrocystic breast disease, inflammatory arthritis, autoimmune hepatitis and neuropathy.  She had a small non-ST elevation myocardial infarction in March 2016 with acute diastolic heart failure after a GI illness.  Echocardiogram showed normal LV systolic function. Cardiac catheterization showed showed 60% ostial left circumflex hazy stenosis which was possibly the culprit. Mild LAD/RCA disease. She was treated medically.   She had cardioversion 3 times since October due to recurrent highly symptomatic atrial fibrillation.  Most recently was on April 1 after reloading with amiodarone.  Due to recurrent atrial fibrillation, I elected for rate control and discontinued amiodarone.  Rate control has been achieved with metoprolol but in spite of that she has been having worsening exertional dyspnea with minimal activities.  Recent echocardiogram showed an EF of 50 to 55%, mild aortic stenosis, moderate mitral regurgitation and severe pulmonary hypertension with estimated systolic pulmonary pressure of 65 mmHg.  I attempted increasing her diuretic but that was associated with worsening renal function.  She denies any chest pain.  She had a recent 10 pound weight gain with significant leg edema.  Past Medical History:  Diagnosis Date  . Atrial fibrillation ([place])    . Autoimmune hepatitis ([place])    followed by Dr [name]  . CHF (congestive heart failure) ([place])   . Coronary artery disease    Non-ST elevation myocardial infarction in March 2016. Cardiac catheterization showed 60% ostial left circumflex hazy stenosis which was possibly the culprit. Mild LAD/RCA disease. EF 60% by echo.  . Fibrocystic breast disease   . Hypercholesterolemia   . Hypertension   . Inflammatory arthritis   . MI (myocardial infarction) ([place])   . Neuropathy   . Osteoarthritis   . 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 Thyroid disease     Past Surgical History:  Procedure Laterality Date  . ABDOMINAL HYSTERECTOMY  1963   partial, secondary to fibroids  . APPENDECTOMY    . Billroth    . CARDIAC CATHETERIZATION  05/12/14   [facility]  . CARDIOVERSION N/A 12/26/2016   Procedure: CARDIOVERSION;  Surgeon: [name], MD;  Location: [facility] ORS;  Service: Cardiovascular;  Laterality: N/A;  . CARDIOVERSION N/A 05/16/2017   Procedure: CARDIOVERSION;  Surgeon: [name], MD;  Location: [facility] ORS;  Service: Cardiovascular;  Laterality: N/A;  . CARDIOVERSION N/A 06/09/2017   Procedure: CARDIOVERSION;  Surgeon: [name], MD;  Location: [facility] ORS;  Service: Cardiovascular;  Laterality: N/A;  . CHOLECYSTECTOMY  1998  . COLONOSCOPY  2009  . UPPER GI ENDOSCOPY  2004     Current Outpatient Medications  Medication Sig Dispense Refill  . brimonidine-timolol (COMBIGAN) 0.2-0.5 % ophthalmic solution Place 1 drop into the left eye 2 (two) times daily.     Marland Kitchen  dorzolamide (TRUSOPT) 2 % ophthalmic solution Place 1 drop into the left eye 2 (two) times daily.    . fluticasone (FLONASE) 50 MCG/ACT nasal spray Place 1-2 sprays into both nostrils daily as needed (for allergies.).     Marland Kitchen furosemide (LASIX) 20 MG tablet Take 1 tablet (20 mg) by mouth once every other day    . gabapentin (NEURONTIN) 300 MG capsule TAKE 2 CAPSULES BY  MOUTH FOUR TIMES DAILY (Patient taking differently: Take 300 mg by mouth in the morning, take 300 mg by mouth at lunch and take 600 mg by mouth at supper) 720 capsule 0  . ipratropium (ATROVENT) 0.06 % nasal spray Place 2 sprays into both nostrils daily as needed for rhinitis.     Marland Kitchen latanoprost (XALATAN) 0.005 % ophthalmic solution Place 1 drop into both eyes at bedtime.     Marland Kitchen levothyroxine (SYNTHROID, LEVOTHROID) 112 MCG tablet TAKE 1 TABLET BY MOUTH EVERY DAY BEFORE BREAKFAST 90 tablet 0  . metoprolol tartrate (LOPRESSOR) 100 MG tablet Take 1 tablet (100 mg total) by mouth 2 (two) times daily. 180 tablet 3  . pantoprazole (PROTONIX) 40 MG tablet Take 1 tablet (40 mg total) by mouth daily. (Patient taking differently: Take 40 mg by mouth every evening. ) 90 tablet 1  . Rivaroxaban (XARELTO) 15 MG TABS tablet Take 1 tablet (15 mg total) by mouth daily with supper. 30 tablet 6  . traMADol (ULTRAM) 50 MG tablet Take 1 tablet (50 mg total) by mouth 3 (three) times daily as needed (for pain.). 90 tablet 2  . ursodiol (ACTIGALL) 300 MG capsule Take 300-600 mg by mouth 2 (two) times daily. Take 2 capsules (600 mg) daily after lunch & 1 capsule (300 mg) at night.     No current facility-administered medications for this visit.     Allergies:   Statins; Haldol [haloperidol lactate]; Librax [chlordiazepoxide-clidinium]; Plavix [clopidogrel bisulfate]; and Ramipril    Social History:  The patient  reports that she has never smoked. She has never used smokeless tobacco. She reports that she does not drink alcohol or use drugs.   Family History:  The patient's family history includes Cancer in her daughter; Esophageal cancer in her brother; Ovarian cancer in her daughter; Stroke in her mother.    ROS:  Please see the history of present illness.   Otherwise, review of systems are positive for none.   All other systems are reviewed and negative.    PHYSICAL EXAM: VS:  BP 122/70 (BP Location: Left Arm,  Patient Position: Sitting, Cuff Size: Normal)   Pulse 76   Ht 5\' 2"  (1.575 m)   Wt 147 lb (66.7 kg)   BMI 26.89 kg/m  , BMI Body mass index is 26.89 kg/m. GEN: Well nourished, well developed, in no acute distress  HEENT: normal  Neck: Mild JVD, carotid bruits, or masses Cardiac: Irregularly irregular; no rubs, or gallops,no edema . 1/ 6 systolic ejection murmur at the aortic area.  Moderate to severe bilateral leg edema Respiratory:  clear to auscultation bilaterally, normal work of breathing GI: soft, nontender, nondistended, + BS MS: no deformity or atrophy  Skin: warm and dry, no rash Neuro:  Strength and sensation are intact Psych: euthymic mood, full affect   EKG:  EKG is ordered today. The ekg ordered today demonstrates atrial fibrillation with ventricular rate of 76 bpm and nonspecific ST and T wave changes.   Recent Labs: 05/26/2017: Hemoglobin 11.0; Platelets 272; TSH 2.310 05/27/2017: ALT 17 08/01/2017: BUN  38; Creatinine, Ser 1.76; Potassium 4.7; Sodium 139    Lipid Panel    Component Value Date/Time   CHOL 159 05/27/2017 0959   TRIG 101.0 05/27/2017 0959   HDL 64.30 05/27/2017 0959   CHOLHDL 2 05/27/2017 0959   VLDL 20.2 05/27/2017 0959   LDLCALC 74 05/27/2017 0959   LDLDIRECT 143.5 12/17/2012 0955      Wt Readings from Last 3 Encounters:  08/19/17 147 lb (66.7 kg)  07/25/17 135 lb (61.2 kg)  06/27/17 134 lb 4 oz (60.9 kg)       ASSESSMENT AND PLAN:  1.  Persistent atrial fibrillation:   She failed to maintain sinus rhythm even with amiodarone.  Ventricular rate is well controlled with metoprolol and she is tolerating anticoagulation with Xarelto.    2. Coronary artery disease involving native coronary arteries with other forms of angina.  I am concerned that her dyspnea with minimal activities might be due to progression of coronary artery disease.  Given severity of her symptoms, I recommend proceeding with a left and right cardiac catheterization.   The purpose is to evaluate for progression of coronary artery disease as well as evaluation of volume status, mitral regurgitation and pulmonary hypertension.  Her echo is suggestive of significant volume overload and clinically she also appears to be volume overloaded.  However, every attempt of diuresis have been associated with worsening renal function.  We need to determine if mitral regurgitation is hemodynamically significant and if so we might need to proceed with a TEE for evaluation.  3. Chronic diastolic heart failure: Continue small dose furosemide for now until hemodynamic evaluation of the right heart catheterization as outlined above.  4. Essential hypertension: Blood pressure is controlled on current medications.   5. Hyperlipidemia: Not on a statin due to history of autoimmune hepatitis. Most recent LDL was 138.  6.  Moderate to severe mitral regurgitation: This was moderate and most recent echocardiogram but will be evaluated with cardiac cath.  Disposition:   FU with me in 1 month.  Signed,  [name], MD  08/19/2017 1:47 PM    [place]

## 2017-08-19 NOTE — Patient Instructions (Addendum)
  [facility] Cardiac Cath Instructions   You are scheduled for a Cardiac Cath on:______6/13/19 with Dr. Fletcher Anon.  Please arrive at ___7:30____am on the day of your procedure  Please expect a call from our [place] to pre-register you  Do not eat/drink anything after midnight  Someone will need to drive you home  It is recommended someone be with you for the first 24 hours after your procedure  Wear clothes that are easy to get on/off and wear slip on shoes if possible   Medications bring a current list of all medications with you  ___ You may take all of your medications the morning of your procedure with enough water to swallow safely  __X_ Do not take these medications before your procedure Please hold the Xarelto starting today 08/19/17  Day of your procedure: Arrive at the [facility] entrance.  Free valet service is available.  After entering the [facility] please check-in at the registration desk (1st desk on your right) to receive your armband. After receiving your armband someone will escort you to the cardiac cath/special procedures waiting area.  The usual length of stay after your procedure is about 2 to 3 hours.  This can vary.  If you have any questions, please call our office at [phone], or you may call the cardiac cath lab at [facility] directly at [phone]   Your provider wants you to follow up in one month with Dr. Fletcher Anon.

## 2017-08-20 LAB — BASIC METABOLIC PANEL
BUN / CREAT RATIO: 25 (ref 12–28)
BUN: 31 mg/dL — AB (ref 8–27)
CO2: 18 mmol/L — ABNORMAL LOW (ref 20–29)
CREATININE: 1.26 mg/dL — AB (ref 0.57–1.00)
Calcium: 8.4 mg/dL — ABNORMAL LOW (ref 8.7–10.3)
Chloride: 107 mmol/L — ABNORMAL HIGH (ref 96–106)
GFR, EST AFRICAN AMERICAN: 44 mL/min/{1.73_m2} — AB (ref >59)
GFR, EST NON AFRICAN AMERICAN: 38 mL/min/{1.73_m2} — AB (ref >59)
GLUCOSE: 85 mg/dL (ref 65–99)
Potassium: 4.5 mmol/L (ref 3.5–5.2)
Sodium: 138 mmol/L (ref 134–144)

## 2017-08-20 LAB — CBC
HEMATOCRIT: 31.5 % — AB (ref 34.0–46.6)
Hemoglobin: 9.6 g/dL — ABNORMAL LOW (ref 11.1–15.9)
MCH: 25.9 pg — AB (ref 26.6–33.0)
MCHC: 30.5 g/dL — ABNORMAL LOW (ref 31.5–35.7)
MCV: 85 fL (ref 79–97)
Platelets: 299 10*3/uL (ref 150–450)
RBC: 3.7 x10E6/uL — ABNORMAL LOW (ref 3.77–5.28)
RDW: 16 % — AB (ref 12.3–15.4)
WBC: 7.7 10*3/uL (ref 3.4–10.8)

## 2017-08-21 ENCOUNTER — Inpatient Hospital Stay (HOSPITAL_COMMUNITY)
Admission: AD | Admit: 2017-08-21 | Discharge: 2017-08-28 | DRG: 291 | Disposition: A | Discharge status: Home Health | Payer: Medicare Other | Source: Other Acute Inpatient Hospital | Attending: Internal Medicine | Admitting: Internal Medicine

## 2017-08-21 ENCOUNTER — Encounter
Admission: RE | Disposition: A | Discharge status: Other Acute Inpatient Hospital | Payer: Self-pay | Source: Ambulatory Visit | Attending: Cardiovascular Disease

## 2017-08-21 ENCOUNTER — Other Ambulatory Visit: Payer: Self-pay

## 2017-08-21 ENCOUNTER — Inpatient Hospital Stay
Admission: RE | Admit: 2017-08-21 | Discharge: 2017-08-21 | DRG: 287 | Disposition: A | Discharge status: Other Acute Inpatient Hospital | Payer: Medicare Other | Source: Ambulatory Visit | Attending: Cardiovascular Disease | Admitting: Cardiovascular Disease

## 2017-08-21 DIAGNOSIS — I130 Hypertensive heart and chronic kidney disease with heart failure and stage 1 through stage 4 chronic kidney disease, or unspecified chronic kidney disease: Principal | ICD-10-CM | POA: Diagnosis present

## 2017-08-21 DIAGNOSIS — Z7901 Long term (current) use of anticoagulants: Secondary | ICD-10-CM | POA: Diagnosis not present

## 2017-08-21 DIAGNOSIS — Z9049 Acquired absence of other specified parts of digestive tract: Secondary | ICD-10-CM | POA: Diagnosis not present

## 2017-08-21 DIAGNOSIS — I252 Old myocardial infarction: Secondary | ICD-10-CM | POA: Diagnosis not present

## 2017-08-21 DIAGNOSIS — M1990 Unspecified osteoarthritis, unspecified site: Secondary | ICD-10-CM | POA: Diagnosis present

## 2017-08-21 DIAGNOSIS — I272 Pulmonary hypertension, unspecified: Secondary | ICD-10-CM | POA: Diagnosis not present

## 2017-08-21 DIAGNOSIS — I482 Chronic atrial fibrillation: Secondary | ICD-10-CM | POA: Diagnosis not present

## 2017-08-21 DIAGNOSIS — I340 Nonrheumatic mitral (valve) insufficiency: Secondary | ICD-10-CM | POA: Diagnosis present

## 2017-08-21 DIAGNOSIS — I5033 Acute on chronic diastolic (congestive) heart failure: Secondary | ICD-10-CM | POA: Diagnosis not present

## 2017-08-21 DIAGNOSIS — Z888 Allergy status to other drugs, medicaments and biological substances status: Secondary | ICD-10-CM

## 2017-08-21 DIAGNOSIS — Z823 Family history of stroke: Secondary | ICD-10-CM | POA: Diagnosis not present

## 2017-08-21 DIAGNOSIS — Z90710 Acquired absence of both cervix and uterus: Secondary | ICD-10-CM | POA: Diagnosis not present

## 2017-08-21 DIAGNOSIS — H409 Unspecified glaucoma: Secondary | ICD-10-CM | POA: Diagnosis present

## 2017-08-21 DIAGNOSIS — E039 Hypothyroidism, unspecified: Secondary | ICD-10-CM | POA: Diagnosis present

## 2017-08-21 DIAGNOSIS — N179 Acute kidney failure, unspecified: Secondary | ICD-10-CM | POA: Diagnosis present

## 2017-08-21 DIAGNOSIS — Z79899 Other long term (current) drug therapy: Secondary | ICD-10-CM | POA: Diagnosis not present

## 2017-08-21 DIAGNOSIS — I481 Persistent atrial fibrillation: Secondary | ICD-10-CM | POA: Diagnosis present

## 2017-08-21 DIAGNOSIS — N189 Chronic kidney disease, unspecified: Secondary | ICD-10-CM | POA: Diagnosis present

## 2017-08-21 DIAGNOSIS — Z801 Family history of malignant neoplasm of trachea, bronchus and lung: Secondary | ICD-10-CM | POA: Diagnosis not present

## 2017-08-21 DIAGNOSIS — K754 Autoimmune hepatitis: Secondary | ICD-10-CM | POA: Diagnosis present

## 2017-08-21 DIAGNOSIS — I425 Other restrictive cardiomyopathy: Secondary | ICD-10-CM | POA: Diagnosis present

## 2017-08-21 DIAGNOSIS — E7800 Pure hypercholesterolemia, unspecified: Secondary | ICD-10-CM | POA: Diagnosis present

## 2017-08-21 DIAGNOSIS — Z800 Family history of malignant neoplasm of digestive organs: Secondary | ICD-10-CM

## 2017-08-21 DIAGNOSIS — M064 Inflammatory polyarthropathy: Secondary | ICD-10-CM | POA: Diagnosis present

## 2017-08-21 DIAGNOSIS — G629 Polyneuropathy, unspecified: Secondary | ICD-10-CM | POA: Diagnosis present

## 2017-08-21 DIAGNOSIS — E785 Hyperlipidemia, unspecified: Secondary | ICD-10-CM | POA: Diagnosis present

## 2017-08-21 DIAGNOSIS — I5031 Acute diastolic (congestive) heart failure: Secondary | ICD-10-CM

## 2017-08-21 DIAGNOSIS — I2510 Atherosclerotic heart disease of native coronary artery without angina pectoris: Secondary | ICD-10-CM | POA: Diagnosis present

## 2017-08-21 DIAGNOSIS — Z8041 Family history of malignant neoplasm of ovary: Secondary | ICD-10-CM | POA: Diagnosis not present

## 2017-08-21 DIAGNOSIS — I509 Heart failure, unspecified: Secondary | ICD-10-CM

## 2017-08-21 DIAGNOSIS — I110 Hypertensive heart disease with heart failure: Principal | ICD-10-CM | POA: Diagnosis present

## 2017-08-21 DIAGNOSIS — Z8711 Personal history of peptic ulcer disease: Secondary | ICD-10-CM | POA: Diagnosis not present

## 2017-08-21 DIAGNOSIS — I25119 Atherosclerotic heart disease of native coronary artery with unspecified angina pectoris: Secondary | ICD-10-CM | POA: Diagnosis present

## 2017-08-21 HISTORY — DX: Hypothyroidism, unspecified: E03.9

## 2017-08-21 HISTORY — DX: Unspecified glaucoma: H40.9

## 2017-08-21 HISTORY — DX: Permanent atrial fibrillation: I48.21

## 2017-08-21 HISTORY — DX: Unspecified diastolic (congestive) heart failure: I50.30

## 2017-08-21 HISTORY — DX: Nonrheumatic mitral (valve) insufficiency: I34.0

## 2017-08-21 HISTORY — PX: RIGHT/LEFT HEART CATH AND CORONARY ANGIOGRAPHY: CATH118266

## 2017-08-21 LAB — HEPARIN LEVEL (UNFRACTIONATED): HEPARIN UNFRACTIONATED: 0.13 [IU]/mL — AB (ref 0.30–0.70)

## 2017-08-21 LAB — APTT: APTT: 27 s (ref 24–36)

## 2017-08-21 LAB — PROTIME-INR
INR: 1.09
Prothrombin Time: 14 seconds (ref 11.4–15.2)

## 2017-08-21 SURGERY — RIGHT/LEFT HEART CATH AND CORONARY ANGIOGRAPHY
Anesthesia: Moderate Sedation | Laterality: Bilateral

## 2017-08-21 MED ORDER — HEPARIN SODIUM (PORCINE) 1000 UNIT/ML IJ SOLN
INTRAMUSCULAR | Status: AC
  Filled 2017-08-21: qty 1

## 2017-08-21 MED ORDER — SODIUM CHLORIDE 0.9 % IV SOLN
INTRAVENOUS | Status: DC
  Administered 2017-08-21: 08:00:00 via INTRAVENOUS

## 2017-08-21 MED ORDER — HEPARIN (PORCINE) IN NACL 1000-0.9 UT/500ML-% IV SOLN
INTRAVENOUS | Status: AC
  Filled 2017-08-21: qty 1000

## 2017-08-21 MED ORDER — METOPROLOL TARTRATE 100 MG PO TABS
100.0000 mg | ORAL_TABLET | Freq: Two times a day (BID) | ORAL | Status: DC
  Administered 2017-08-21: 100 mg via ORAL
  Filled 2017-08-21 (×2): qty 1
  Filled 2017-08-21: qty 2

## 2017-08-21 MED ORDER — HEPARIN (PORCINE) IN NACL 100-0.45 UNIT/ML-% IJ SOLN: 950.0000 [IU]/h | INTRAMUSCULAR | Status: DC

## 2017-08-21 MED ORDER — FENTANYL CITRATE (PF) 100 MCG/2ML IJ SOLN
INTRAMUSCULAR | Status: AC
  Filled 2017-08-21: qty 2

## 2017-08-21 MED ORDER — URSODIOL 300 MG PO CAPS
600.0000 mg | ORAL_CAPSULE | Freq: Every day | ORAL | Status: DC
Start: 2017-08-22 — End: 2017-08-21

## 2017-08-21 MED ORDER — SODIUM CHLORIDE 0.9% FLUSH: 3.0000 mL | INTRAVENOUS | Status: DC | PRN

## 2017-08-21 MED ORDER — METOPROLOL TARTRATE 100 MG PO TABS
100.0000 mg | ORAL_TABLET | Freq: Two times a day (BID) | ORAL | Status: DC
  Administered 2017-08-21 – 2017-08-28 (×14): 100 mg via ORAL
  Filled 2017-08-21 (×14): qty 1

## 2017-08-21 MED ORDER — SODIUM CHLORIDE 0.9 % IV SOLN: 250.0000 mL | INTRAVENOUS | Status: DC | PRN

## 2017-08-21 MED ORDER — GABAPENTIN 300 MG PO CAPS
600.0000 mg | ORAL_CAPSULE | Freq: Four times a day (QID) | ORAL | Status: DC
  Administered 2017-08-21: 600 mg via ORAL
  Filled 2017-08-21: qty 2

## 2017-08-21 MED ORDER — SODIUM CHLORIDE 0.9% FLUSH
3.0000 mL | INTRAVENOUS | Status: DC | PRN
  Administered 2017-08-24: 3 mL via INTRAVENOUS
  Filled 2017-08-21: qty 3

## 2017-08-21 MED ORDER — LIDOCAINE HCL (PF) 1 % IJ SOLN
INTRAMUSCULAR | Status: DC | PRN
  Administered 2017-08-21: 5 mL

## 2017-08-21 MED ORDER — TIMOLOL MALEATE 0.5 % OP SOLN
1.0000 [drp] | Freq: Two times a day (BID) | OPHTHALMIC | Status: DC
  Administered 2017-08-21 – 2017-08-28 (×14): 1 [drp] via OPHTHALMIC
  Filled 2017-08-21: qty 5

## 2017-08-21 MED ORDER — SODIUM CHLORIDE 0.9% FLUSH
3.0000 mL | Freq: Two times a day (BID) | INTRAVENOUS | Status: DC
  Administered 2017-08-21: 3 mL via INTRAVENOUS

## 2017-08-21 MED ORDER — LIDOCAINE HCL (PF) 1 % IJ SOLN
INTRAMUSCULAR | Status: AC
  Filled 2017-08-21: qty 30

## 2017-08-21 MED ORDER — ONDANSETRON HCL 4 MG/2ML IJ SOLN
4.0000 mg | Freq: Four times a day (QID) | INTRAMUSCULAR | Status: DC | PRN
Start: 2017-08-21 — End: 2017-08-21

## 2017-08-21 MED ORDER — SODIUM CHLORIDE 0.9% FLUSH: 3.0000 mL | Freq: Two times a day (BID) | INTRAVENOUS | Status: DC

## 2017-08-21 MED ORDER — BRIMONIDINE TARTRATE-TIMOLOL 0.2-0.5 % OP SOLN: 1.0000 [drp] | Freq: Two times a day (BID) | OPHTHALMIC | Status: DC

## 2017-08-21 MED ORDER — FUROSEMIDE 10 MG/ML IJ SOLN
INTRAMUSCULAR | Status: AC
  Filled 2017-08-21: qty 2

## 2017-08-21 MED ORDER — DORZOLAMIDE HCL 2 % OP SOLN
1.0000 [drp] | Freq: Two times a day (BID) | OPHTHALMIC | Status: DC
  Filled 2017-08-21: qty 10

## 2017-08-21 MED ORDER — VERAPAMIL HCL 2.5 MG/ML IV SOLN
INTRAVENOUS | Status: AC
  Filled 2017-08-21: qty 2

## 2017-08-21 MED ORDER — TRAMADOL HCL 50 MG PO TABS: 50.0000 mg | ORAL_TABLET | Freq: Three times a day (TID) | ORAL | Status: DC | PRN

## 2017-08-21 MED ORDER — ASPIRIN 81 MG PO CHEW
81.0000 mg | CHEWABLE_TABLET | ORAL | Status: AC
  Administered 2017-08-21: 81 mg via ORAL

## 2017-08-21 MED ORDER — BRIMONIDINE TARTRATE 0.2 % OP SOLN
2.0000 [drp] | Freq: Two times a day (BID) | OPHTHALMIC | Status: DC
  Administered 2017-08-21 – 2017-08-28 (×14): 2 [drp] via OPHTHALMIC
  Filled 2017-08-21: qty 5

## 2017-08-21 MED ORDER — FENTANYL CITRATE (PF) 100 MCG/2ML IJ SOLN
INTRAMUSCULAR | Status: DC | PRN
  Administered 2017-08-21 (×2): 25 ug via INTRAVENOUS

## 2017-08-21 MED ORDER — ACETAMINOPHEN 325 MG PO TABS: 650.0000 mg | ORAL_TABLET | ORAL | Status: DC | PRN

## 2017-08-21 MED ORDER — LEVOTHYROXINE SODIUM 112 MCG PO TABS
112.0000 ug | ORAL_TABLET | Freq: Every day | ORAL | Status: DC
  Administered 2017-08-22 – 2017-08-28 (×7): 112 ug via ORAL
  Filled 2017-08-21 (×9): qty 1

## 2017-08-21 MED ORDER — MIDAZOLAM HCL 2 MG/2ML IJ SOLN
INTRAMUSCULAR | Status: AC
  Filled 2017-08-21: qty 2

## 2017-08-21 MED ORDER — GABAPENTIN 300 MG PO CAPS
600.0000 mg | ORAL_CAPSULE | Freq: Four times a day (QID) | ORAL | Status: DC
  Administered 2017-08-21 – 2017-08-28 (×27): 600 mg via ORAL
  Filled 2017-08-21 (×26): qty 2

## 2017-08-21 MED ORDER — FUROSEMIDE 10 MG/ML IJ SOLN
40.0000 mg | Freq: Two times a day (BID) | INTRAMUSCULAR | Status: DC
  Administered 2017-08-22: 40 mg via INTRAVENOUS
  Filled 2017-08-21 (×2): qty 4

## 2017-08-21 MED ORDER — SODIUM CHLORIDE 0.9 % IV SOLN
250.0000 mL | INTRAVENOUS | Status: DC | PRN
  Administered 2017-08-26: 250 mL via INTRAVENOUS

## 2017-08-21 MED ORDER — ASPIRIN 81 MG PO CHEW
CHEWABLE_TABLET | ORAL | Status: AC
  Filled 2017-08-21: qty 1

## 2017-08-21 MED ORDER — FLUTICASONE PROPIONATE 50 MCG/ACT NA SUSP
2.0000 | Freq: Every day | NASAL | Status: DC | PRN
  Filled 2017-08-21: qty 16

## 2017-08-21 MED ORDER — MIDAZOLAM HCL 2 MG/2ML IJ SOLN
INTRAMUSCULAR | Status: DC | PRN
  Administered 2017-08-21: 1 mg via INTRAVENOUS

## 2017-08-21 MED ORDER — BRIMONIDINE TARTRATE-TIMOLOL 0.2-0.5 % OP SOLN
1.0000 [drp] | Freq: Two times a day (BID) | OPHTHALMIC | Status: DC
  Filled 2017-08-21: qty 5

## 2017-08-21 MED ORDER — IOPAMIDOL (ISOVUE-300) INJECTION 61%
INTRAVENOUS | Status: DC | PRN
  Administered 2017-08-21: 80 mL via INTRA_ARTERIAL

## 2017-08-21 MED ORDER — PANTOPRAZOLE SODIUM 40 MG PO TBEC: 40.0000 mg | DELAYED_RELEASE_TABLET | Freq: Every day | ORAL | Status: DC

## 2017-08-21 MED ORDER — RIVAROXABAN 15 MG PO TABS
15.0000 mg | ORAL_TABLET | Freq: Every day | ORAL | Status: DC
  Administered 2017-08-21 – 2017-08-28 (×8): 15 mg via ORAL
  Filled 2017-08-21 (×8): qty 1

## 2017-08-21 MED ORDER — DORZOLAMIDE HCL 2 % OP SOLN
1.0000 [drp] | Freq: Two times a day (BID) | OPHTHALMIC | Status: DC
  Administered 2017-08-21 – 2017-08-28 (×14): 1 [drp] via OPHTHALMIC
  Filled 2017-08-21: qty 10

## 2017-08-21 MED ORDER — FUROSEMIDE 10 MG/ML IJ SOLN
20.0000 mg | Freq: Every day | INTRAMUSCULAR | Status: DC
  Administered 2017-08-21: 20 mg via INTRAVENOUS

## 2017-08-21 MED ORDER — LATANOPROST 0.005 % OP SOLN
1.0000 [drp] | Freq: Every day | OPHTHALMIC | Status: DC
  Administered 2017-08-21 – 2017-08-27 (×7): 1 [drp] via OPHTHALMIC
  Filled 2017-08-21: qty 2.5

## 2017-08-21 MED ORDER — URSODIOL 300 MG PO CAPS
300.0000 mg | ORAL_CAPSULE | Freq: Every day | ORAL | Status: DC
  Filled 2017-08-21: qty 1

## 2017-08-21 MED ORDER — PANTOPRAZOLE SODIUM 40 MG PO TBEC
40.0000 mg | DELAYED_RELEASE_TABLET | Freq: Every evening | ORAL | Status: DC
  Administered 2017-08-21 – 2017-08-28 (×8): 40 mg via ORAL
  Filled 2017-08-21 (×8): qty 1

## 2017-08-21 MED ORDER — SODIUM CHLORIDE 0.9% FLUSH
3.0000 mL | Freq: Two times a day (BID) | INTRAVENOUS | Status: DC
  Administered 2017-08-22 – 2017-08-28 (×10): 3 mL via INTRAVENOUS

## 2017-08-21 MED ORDER — VERAPAMIL HCL 2.5 MG/ML IV SOLN
INTRAVENOUS | Status: DC | PRN
  Administered 2017-08-21: 2.5 mg via INTRA_ARTERIAL

## 2017-08-21 MED ORDER — LATANOPROST 0.005 % OP SOLN
1.0000 [drp] | Freq: Every day | OPHTHALMIC | Status: DC
  Filled 2017-08-21: qty 2.5

## 2017-08-21 MED ORDER — LEVOTHYROXINE SODIUM 112 MCG PO TABS: 112.0000 ug | ORAL_TABLET | Freq: Every day | ORAL | Status: DC

## 2017-08-21 MED ORDER — TRAMADOL HCL 50 MG PO TABS
50.0000 mg | ORAL_TABLET | Freq: Every day | ORAL | Status: DC | PRN
  Administered 2017-08-22 – 2017-08-24 (×5): 50 mg via ORAL
  Filled 2017-08-21 (×5): qty 1

## 2017-08-21 MED ORDER — URSODIOL 300 MG PO CAPS
600.0000 mg | ORAL_CAPSULE | ORAL | Status: DC
  Administered 2017-08-22 – 2017-08-28 (×6): 600 mg via ORAL
  Filled 2017-08-21 (×7): qty 2

## 2017-08-21 MED ORDER — URSODIOL 300 MG PO CAPS
300.0000 mg | ORAL_CAPSULE | Freq: Every day | ORAL | Status: DC
  Administered 2017-08-21 – 2017-08-27 (×7): 300 mg via ORAL
  Filled 2017-08-21 (×8): qty 1

## 2017-08-21 MED ORDER — HEPARIN SODIUM (PORCINE) 1000 UNIT/ML IJ SOLN
INTRAMUSCULAR | Status: DC | PRN
  Administered 2017-08-21: 3000 [IU] via INTRAVENOUS

## 2017-08-21 MED ORDER — ONDANSETRON HCL 4 MG/2ML IJ SOLN: 4.0000 mg | Freq: Four times a day (QID) | INTRAMUSCULAR | Status: DC | PRN

## 2017-08-21 MED ORDER — HEPARIN (PORCINE) IN NACL 2-0.9 UNITS/ML
INTRAMUSCULAR | Status: AC | PRN
  Administered 2017-08-21: 1000 mL via INTRA_ARTERIAL

## 2017-08-21 SURGICAL SUPPLY — 15 items
BAND COMPRESS 30CM LRG LONG (HEMOSTASIS) ×1
BAND ZEPHYR COMPRESS 30 LONG (HEMOSTASIS) ×2 IMPLANT
CATH BALLN WEDGE 5F 110CM (CATHETERS) ×3 IMPLANT
CATH INFINITI 5 FR JL3.5 (CATHETERS) ×3 IMPLANT
CATH INFINITI 5FR ANG PIGTAIL (CATHETERS) ×3 IMPLANT
CATH INFINITI 5FR TG (CATHETERS) ×3 IMPLANT
GUIDEWIRE EMER 3M J .025X150CM (WIRE) ×3 IMPLANT
KIT MANI 3VAL PERCEP (MISCELLANEOUS) ×3 IMPLANT
KIT RIGHT HEART (MISCELLANEOUS) ×3 IMPLANT
NEEDLE PERC 21GX4CM (NEEDLE) ×3 IMPLANT
PACK CARDIAC CATH (CUSTOM PROCEDURE TRAY) ×3 IMPLANT
SHEATH RAIN 4/5FR (SHEATH) ×3 IMPLANT
SHEATH RAIN RADIAL 21G 6FR (SHEATH) ×3 IMPLANT
WIRE HITORQ VERSACORE ST 145CM (WIRE) ×3 IMPLANT
WIRE ROSEN-J .035X260CM (WIRE) ×3 IMPLANT

## 2017-08-21 NOTE — Progress Notes (Signed)
Please referred to for history and physical done by Murray Hodgkins, NP earlier today on transfer of patient to [facility].  Patient has a history of CAD, chronic diastolic CHF, persistent atrial fibrillation on a coagulation with Xarelto, hypertension, autoimmune hepatitis who was admitted for diagnostic catheterization who had a non-STEMI in March 2016 and cath showed ostial 60% left circumflex with minimal CAD elsewhere.  She has had worsening exertional dyspnea despite adequate heart rate control on beta-blocker therapy and recent echo showed normal LV function but severe pulmonary hypertension and she did not respond well to increasing outpatient diuretic dosing and therefore underwent right and left heart cath earlier today.  This showed no change in her minimal nonobstructive coronary disease but noted 4+ MR and elevated LVEDP at 20 to 27 mmHg.  She is now transferred to [facility] for further evaluation but heart failure team.  On arrival this evening she is doing well.  She has no complaints.  She is able to lie flat without any shortness of breath.  She received Lasix earlier in the afternoon and has already put out 400 cc on arrival to [facility].  BP is stable at 120/73 mmHg heart rate 64 bpm.  Exam shows lungs with few crackles at the bases but otherwise clear and a 2/6 systolic murmur at the left lower sternal border to the apex.  Discussed with Dr. Algernon Huxley and will restart Xarelto tonight since she is 12 hours out from her cath and her radial artery site appears without hematoma.  There is some mild ecchymosis.  She will continue on Lopressor 100 mg twice daily for heart rate control.  Her Xarelto will be dosed per pharmacy due to her chronic kidney disease.  I will also get a repeat be met since she has not had one in several days.  She will be started on Lasix 40 mg IV twice daily.  Continue strict I's and O's and daily weights.  We will get another be met in the morning.  She is  going to be followed by the heart failure service.

## 2017-08-21 NOTE — Progress Notes (Addendum)
[place] for Heparin  Indication: atrial fibrillation  Allergies  Allergen Reactions  . Statins Other (See Comments)    Affect her liver, Caused liver problems   . Haldol [Haloperidol Lactate] Rash  . Librax [Chlordiazepoxide-Clidinium] Rash  . Plavix [Clopidogrel Bisulfate] Rash  . Ramipril Rash    Patient Measurements: Height: 5\' 2"  (157.5 cm) Weight: 146 lb (66.2 kg) IBW/kg (Calculated) : 50.1 Heparin Dosing Weight: 64 kg  Vital Signs: Temp: 98 F (36.7 C) (06/13 1521) Temp Source: Oral (06/13 1521) BP: 134/97 (06/13 1521) Pulse Rate: 68 (06/13 1521)  Labs: Recent Labs    08/19/17 1410  HGB 9.6*  HCT 31.5*  PLT 299  CREATININE 1.26*    Estimated Creatinine Clearance: 28.1 mL/min (A) (by C-G formula based on SCr of 1.26 mg/dL (H)).   Medical History: Past Medical History:  Diagnosis Date  . (HFpEF) heart failure with preserved ejection fraction ([place])    a. 07/2017 Echo: EF 50-55%, no rwma, mild AS/AI, mod MR, mod dil LA, mod TR, PASP 23mmHg.  . Autoimmune hepatitis ([place])    followed by Dr [name]  . Fibrocystic breast disease   . Glaucoma   . Hypercholesterolemia   . Hypertension   . Hypothyroidism   . Inflammatory arthritis   . MI (myocardial infarction) ([place])   . Neuropathy   . Non-obstructive Coronary artery disease    a. 05/2014 NSTEMI/Cath: LM nl, LAD mild dzs, LCX 60ost hazy (? culprit), RCA mild dzs. EF 60% by echo-->Med Rx; b. 08/2017 Cath: LM 30ost, LAD min irregs, LCX small, 40ost/p, RCA large, min irregs, RPDA/RPL1 nl, EF 50-55%. 4+MR.  . Osteoarthritis   . Permanent atrial fibrillation ([facility])    a. CHA2DS2VASc = 6-->Xarelto.  . 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 Severe mitral regurgitation    a. 07/2017 Echo: Mod MR; b. 08/2017 Cath: 4+/Severe MR.   Assessment: 84 y/o F with a h/o AFIB on rivaroxaban admitted with AECHF to transition to heparin  drip.   Goal of Therapy:  APTT 66-102 s Heparin level 0.3-0.7 units/ml Monitor platelets by anticoagulation protocol: Yes   Plan:  At this point it is unclear when patient had last dose of rivaroxaban. Will check HL and plan on no bolus for now. Anticipate using aPTT initially.  Start heparin infusion at 950 units/hr Check anti-Xa level in 6 hours and daily while on heparin Continue to monitor H&H and platelets   Handoff to [place] at [facility] 6/13 @ [address] D 08/21/2017,3:44 PM

## 2017-08-21 NOTE — Progress Notes (Signed)
Pt transferred to [facility]. Report given to [place] and Margreta Journey, RN on [place].

## 2017-08-21 NOTE — Progress Notes (Signed)
Attempted to remove 1 ml air from TR band. Pt. Oozed immediately, so 1 ml added immediately. Pt wrist tender immed. Post cath. + pulses right wrist & brachial site.

## 2017-08-21 NOTE — H&P (Addendum)
History & Physical    Patient ID: SHATIA SINDONI MRN: [id number], DOB/AGE: Aug 05, 1928   Admit date: 08/21/2017   Primary Physician: Einar Pheasant, MD Primary Cardiologist: [name], MD  Patient Profile    84 year old female with a history of coronary artery disease, chronic diastolic congestive heart failure, persistent atrial fibrillation, hypertension, peptic ulcer disease, fibrocystic breast disease, inflammatory arthritis, autoimmune hepatitis, and neuropathy, who is being admitted following diagnostic catheterization secondary to volume overload and severe mitral regurgitation.  Past Medical History    Past Medical History:  Diagnosis Date  . (HFpEF) heart failure with preserved ejection fraction ([place])    a. 07/2017 Echo: EF 50-55%, no rwma, mild AS/AI, mod MR, mod dil LA, mod TR, PASP 59mmHg.  . Autoimmune hepatitis ([place])    followed by Dr [name]  . Fibrocystic breast disease   . Glaucoma   . Hypercholesterolemia   . Hypertension   . Hypothyroidism   . Inflammatory arthritis   . MI (myocardial infarction) ([place])   . Neuropathy   . Non-obstructive Coronary artery disease    a. 05/2014 NSTEMI/Cath: LM nl, LAD mild dzs, LCX 60ost hazy (? culprit), RCA mild dzs. EF 60% by echo-->Med Rx; b. 08/2017 Cath: LM 30ost, LAD min irregs, LCX small, 40ost/p, RCA large, min irregs, RPDA/RPL1 nl, EF 50-55%. 4+MR.  . Osteoarthritis   . Permanent atrial fibrillation (HCC)    a. CHA2DS2VASc = 6-->Xarelto.  . 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 Severe mitral regurgitation    a. 07/2017 Echo: Mod MR; b. 08/2017 Cath: 4+/Severe MR.    Past Surgical History:  Procedure Laterality Date  . ABDOMINAL HYSTERECTOMY  1963   partial, secondary to fibroids  . APPENDECTOMY    . Billroth    . CARDIAC CATHETERIZATION  05/12/14   [facility]  . CARDIOVERSION N/A 12/26/2016   Procedure: CARDIOVERSION;  Surgeon: [name], MD;   Location: [facility] ORS;  Service: Cardiovascular;  Laterality: N/A;  . CARDIOVERSION N/A 05/16/2017   Procedure: CARDIOVERSION;  Surgeon: [name], MD;  Location: [facility] ORS;  Service: Cardiovascular;  Laterality: N/A;  . CARDIOVERSION N/A 06/09/2017   Procedure: CARDIOVERSION;  Surgeon: [name], MD;  Location: [facility] ORS;  Service: Cardiovascular;  Laterality: N/A;  . CHOLECYSTECTOMY  1998  . COLONOSCOPY  2009  . UPPER GI ENDOSCOPY  2004     Allergies  Allergies  Allergen Reactions  . Statins Other (See Comments)    Affect her liver, Caused liver problems   . Haldol [Haloperidol Lactate] Rash  . Librax [Chlordiazepoxide-Clidinium] Rash  . Plavix [Clopidogrel Bisulfate] Rash  . Ramipril Rash    History of Present Illness    84 year old female with the above complex past medical history including coronary artery disease, chronic diastolic congestive heart failure, persistent atrial fibrillation on Xarelto, hypertension, peptic ulcer disease, fibrocystic breast disease, inflammatory arthritis, autoimmune hepatitis, and neuropathy.  She had a non-STEMI in March 2016 with acute diastolic heart failure after GI illness.  Echo showed normal LV function.  Catheterization was carried out and showed an ostial 6% left circumflex stenosis with minimal LAD and RCA disease.  She was treated medically.  She has required multiple cardioversions in the setting of symptomatic atrial fibrillation.  Earlier this year, she was placed on amiodarone however this was discontinued due to recurrent atrial fibrillation with election for rate control.  Despite rate control with beta-blocker therapy, she has been having  worsening exertional dyspnea.  Recent echocardiogram showed stable LV function with mild aortic stenosis and moderate mitral regurgitation as well as severe pulmonary hypertension.  Estimated pulmonary artery systolic pressure was 65 mmHg.  She did not respond well to increasing outpatient  diuretic dosing and decision was made to pursue diagnostic catheterization which was performed earlier today.  This showed stable, minimal nonobstructive CAD with 4+ mitral regurgitation and elevated left ventricular end-diastolic pressure of 10-93ATFT.  As result, she will be admitted to [facility] with plan to transfer to [facility] for further evaluation by our heart failure team.  Home Medications    Prior to Admission medications   Medication Sig Start Date End Date Taking? Authorizing Provider  brimonidine-timolol (COMBIGAN) 0.2-0.5 % ophthalmic solution Place 1 drop into the left eye 2 (two) times daily.  04/03/16  Yes [provider]  dorzolamide (TRUSOPT) 2 % ophthalmic solution Place 1 drop into the left eye 2 (two) times daily.   Yes [provider]  fluticasone (FLONASE) 50 MCG/ACT nasal spray Place 2 sprays into both nostrils daily as needed for allergies.  08/27/16  Yes [provider]  furosemide (LASIX) 20 MG tablet Take 1 tablet (20 mg) by mouth once every other day 08/05/17  Yes [name], MD  gabapentin (NEURONTIN) 300 MG capsule TAKE 2 CAPSULES BY MOUTH FOUR TIMES DAILY 04/03/17  Yes Einar Pheasant, MD  ipratropium (ATROVENT) 0.06 % nasal spray Place 2 sprays into both nostrils daily as needed for rhinitis.  04/24/14  Yes [provider]  latanoprost (XALATAN) 0.005 % ophthalmic solution Place 1 drop into both eyes at bedtime.  10/28/16  Yes [provider]  levothyroxine (SYNTHROID, LEVOTHROID) 112 MCG tablet TAKE 1 TABLET BY MOUTH EVERY DAY BEFORE BREAKFAST 06/24/17  Yes Einar Pheasant, MD  metoprolol tartrate (LOPRESSOR) 100 MG tablet Take 1 tablet (100 mg total) by mouth 2 (two) times daily. 06/27/17 09/25/17 Yes [name], MD  pantoprazole (PROTONIX) 40 MG tablet Take 1 tablet (40 mg total) by mouth daily. Patient taking differently: Take 40 mg by mouth every evening.  03/12/17  Yes Einar Pheasant, MD  Rivaroxaban  (XARELTO) 15 MG TABS tablet Take 1 tablet (15 mg total) by mouth daily with supper. 07/28/17  Yes [name], MD  traMADol (ULTRAM) 50 MG tablet Take 1 tablet (50 mg total) by mouth 3 (three) times daily as needed (for pain.). Patient taking differently: Take 50 mg by mouth daily as needed for moderate pain.  05/29/17 05/29/18 Yes Einar Pheasant, MD  ursodiol (ACTIGALL) 300 MG capsule Take 300-600 mg by mouth 2 (two) times daily. Take 2 capsules (600 mg) daily after lunch & 1 capsule (300 mg) at night.   Yes [provider]    Family History    Family History  Problem Relation Age of Onset  . Stroke Mother   . Esophageal cancer Brother        also had lung cancer  . Cancer Daughter        colon  . Ovarian cancer Daughter   . Breast cancer Neg Hx   . Colon cancer Neg Hx    indicated that her mother is deceased. She indicated that her father is deceased. She indicated that her sister is deceased. She indicated that one of her two brothers is deceased. She indicated that her daughter is alive. She indicated that the status of her neg hx is unknown.   Social History    Social  History   Socioeconomic History  . Marital status: Widowed    Spouse name: Not on file  . Number of children: Not on file  . Years of education: Not on file  . Highest education level: Not on file  Occupational History  . Not on file  Social Needs  . Financial resource strain: Not on file  . Food insecurity:    Worry: Not on file    Inability: Not on file  . Transportation needs:    Medical: Not on file    Non-medical: Not on file  Tobacco Use  . Smoking status: Never Smoker  . Smokeless tobacco: Never Used  Substance and Sexual Activity  . Alcohol use: No    Alcohol/week: 0.0 oz  . Drug use: No  . Sexual activity: Never  Lifestyle  . Physical activity:    Days per week: Not on file    Minutes per session: Not on file  . Stress: Not on file  Relationships  . Social connections:      Talks on phone: Not on file    Gets together: Not on file    Attends religious service: Not on file    Active member of club or organization: Not on file    Attends meetings of clubs or organizations: Not on file    Relationship status: Not on file  . Intimate partner violence:    Fear of current or ex partner: Not on file    Emotionally abused: Not on file    Physically abused: Not on file    Forced sexual activity: Not on file  Other Topics Concern  . Not on file  Social History Narrative  . Not on file     Review of Systems    General:  No chills, fever, night sweats or weight changes.  Cardiovascular:  No chest pain, +++ dyspnea on exertion, +++ edema, no orthopnea, palpitations, paroxysmal nocturnal dyspnea. Dermatological: No rash, lesions/masses Respiratory: No cough, +++ dyspnea Urologic: No hematuria, dysuria Abdominal:   No nausea, vomiting, diarrhea, bright red blood per rectum, melena, or hematemesis Neurologic:  No visual changes, wkns, changes in mental status. All other systems reviewed and are otherwise negative except as noted above.  Physical Exam    Blood pressure 123/65, pulse 95, temperature 97.6 F (36.4 C), temperature source Oral, resp. rate 18, height 5\' 2"  (1.575 m), weight 147 lb (66.7 kg), SpO2 95 %.  General: Pleasant, NAD Psych: Normal affect. Neuro: Alert and oriented X 3. Moves all extremities spontaneously. HEENT: Normal  Neck: Supple without bruits.  Mildly elevated JVP. Lungs:  Resp regular and unlabored, CTA. Heart: IR, IR, no s3, s4, 1/6 syst murmur @ base. Abdomen: Soft, non-tender, non-distended, BS + x 4.  Extremities: No clubbing, cyanosis.  2+ bilat LE edema. DP/PT/Radials 2+ and equal bilaterally.  Labs     Lab Results  Component Value Date   WBC 7.7 08/19/2017   HGB 9.6 (L) 08/19/2017   HCT 31.5 (L) 08/19/2017   MCV 85 08/19/2017   PLT 299 08/19/2017    Recent Labs  Lab 08/19/17 1410  NA 138  K 4.5  CL 107*   CO2 18*  BUN 31*  CREATININE 1.26*  CALCIUM 8.4*  GLUCOSE 85   Lab Results  Component Value Date   CHOL 159 05/27/2017   HDL 64.30 05/27/2017   LDLCALC 74 05/27/2017   TRIG 101.0 05/27/2017      Radiology Studies    No results found.  ECG & Cardiac Imaging    Afib, 76, nonspecific ST/T changes  Assessment & Plan    1.  Acute on chronic diastolic congestive heart failure: Patient presented for diagnostic catheterization was found to have significantly elevated filling pressures in the setting of worsening edema and dyspnea recently.  Plan to admit and place on low-dose IV Lasix.  Follow creatinine closely.  Heart rate and blood pressure relatively stable.  Continue beta-blocker.  Once bed available, will likely transfer to [facility] for further evaluation.   2.  Severe/4+ mitral regurgitation: Diuresis as above.  Plan for transfer to [facility] once bed available.  3.  Persistent atrial fibrillation: Continue beta-blocker therapy.  Previously failed cardioversions and amiodarone. Holding xarelto and will place on IV heparin beginning 2000 tonight.  4.  Essential hypertension: Stable.  Signed, Murray Hodgkins, NP 08/21/2017, 3:19 PM

## 2017-08-21 NOTE — Interval H&P Note (Signed)
History and Physical Interval Note:  08/21/2017 9:33 AM  Ruth Roth  has presented today for surgery, with the diagnosis of RT and LT Cath   Mitral valve regurgitation Pulmonary hypertension  The various methods of treatment have been discussed with the patient and family. After consideration of risks, benefits and other options for treatment, the patient has consented to  Procedure(s): RIGHT/LEFT HEART CATH AND CORONARY ANGIOGRAPHY (Bilateral) as a surgical intervention .  The patient's history has been reviewed, patient examined, no change in status, stable for surgery.  I have reviewed the patient's chart and labs.  Questions were answered to the patient's satisfaction.     [name]

## 2017-08-21 NOTE — Progress Notes (Signed)
Dr. Fletcher Anon at bedside speaking to pt.& family Re: cath results. All verbalize understanding. IV's down to [facility] now per MD orders.

## 2017-08-22 ENCOUNTER — Encounter: Payer: Self-pay | Admitting: Cardiovascular Disease

## 2017-08-22 ENCOUNTER — Inpatient Hospital Stay (HOSPITAL_COMMUNITY): Payer: Medicare Other

## 2017-08-22 DIAGNOSIS — I481 Persistent atrial fibrillation: Secondary | ICD-10-CM

## 2017-08-22 DIAGNOSIS — I340 Nonrheumatic mitral (valve) insufficiency: Secondary | ICD-10-CM

## 2017-08-22 LAB — MAGNESIUM: MAGNESIUM: 1.8 mg/dL (ref 1.7–2.4)

## 2017-08-22 LAB — COMPREHENSIVE METABOLIC PANEL
ALBUMIN: 3.3 g/dL — AB (ref 3.5–5.0)
ALK PHOS: 111 U/L (ref 38–126)
ALT: 98 U/L — ABNORMAL HIGH (ref 14–54)
AST: 52 U/L — AB (ref 15–41)
Anion gap: 9 (ref 5–15)
BILIRUBIN TOTAL: 0.5 mg/dL (ref 0.3–1.2)
BUN: 31 mg/dL — AB (ref 6–20)
CO2: 25 mmol/L (ref 22–32)
Calcium: 8.5 mg/dL — ABNORMAL LOW (ref 8.9–10.3)
Chloride: 106 mmol/L (ref 101–111)
Creatinine, Ser: 1.52 mg/dL — ABNORMAL HIGH (ref 0.44–1.00)
GFR calc Af Amer: 34 mL/min — ABNORMAL LOW (ref >60)
GFR calc non Af Amer: 30 mL/min — ABNORMAL LOW (ref >60)
GLUCOSE: 107 mg/dL — AB (ref 65–99)
POTASSIUM: 4.3 mmol/L (ref 3.5–5.1)
SODIUM: 140 mmol/L (ref 135–145)
TOTAL PROTEIN: 5.9 g/dL — AB (ref 6.5–8.1)

## 2017-08-22 LAB — BASIC METABOLIC PANEL
ANION GAP: 6 (ref 5–15)
BUN: 30 mg/dL — ABNORMAL HIGH (ref 6–20)
CHLORIDE: 108 mmol/L (ref 101–111)
CO2: 22 mmol/L (ref 22–32)
Calcium: 7.9 mg/dL — ABNORMAL LOW (ref 8.9–10.3)
Creatinine, Ser: 1.4 mg/dL — ABNORMAL HIGH (ref 0.44–1.00)
GFR calc non Af Amer: 33 mL/min — ABNORMAL LOW (ref >60)
GFR, EST AFRICAN AMERICAN: 38 mL/min — AB (ref >60)
Glucose, Bld: 94 mg/dL (ref 65–99)
POTASSIUM: 3.7 mmol/L (ref 3.5–5.1)
Sodium: 136 mmol/L (ref 135–145)

## 2017-08-22 LAB — CBC WITH DIFFERENTIAL/PLATELET
Abs Immature Granulocytes: 0 10*3/uL (ref 0.0–0.1)
Basophils Absolute: 0.1 10*3/uL (ref 0.0–0.1)
Basophils Relative: 1 %
Eosinophils Absolute: 0.5 10*3/uL (ref 0.0–0.7)
Eosinophils Relative: 7 %
HEMATOCRIT: 34.5 % — AB (ref 36.0–46.0)
HEMOGLOBIN: 10 g/dL — AB (ref 12.0–15.0)
IMMATURE GRANULOCYTES: 0 %
LYMPHS ABS: 1.6 10*3/uL (ref 0.7–4.0)
LYMPHS PCT: 23 %
MCH: 25.6 pg — ABNORMAL LOW (ref 26.0–34.0)
MCHC: 29 g/dL — ABNORMAL LOW (ref 30.0–36.0)
MCV: 88.5 fL (ref 78.0–100.0)
Monocytes Absolute: 0.9 10*3/uL (ref 0.1–1.0)
Monocytes Relative: 13 %
NEUTROS ABS: 3.9 10*3/uL (ref 1.7–7.7)
NEUTROS PCT: 56 %
Platelets: 259 10*3/uL (ref 150–400)
RBC: 3.9 MIL/uL (ref 3.87–5.11)
RDW: 16.5 % — ABNORMAL HIGH (ref 11.5–15.5)
WBC: 7 10*3/uL (ref 4.0–10.5)

## 2017-08-22 LAB — PROTIME-INR
INR: 1.36
Prothrombin Time: 16.7 seconds — ABNORMAL HIGH (ref 11.4–15.2)

## 2017-08-22 LAB — APTT: aPTT: 30 seconds (ref 24–36)

## 2017-08-22 LAB — TSH: TSH: 1.888 u[IU]/mL (ref 0.350–4.500)

## 2017-08-22 LAB — BRAIN NATRIURETIC PEPTIDE: B Natriuretic Peptide: 1241.1 pg/mL — ABNORMAL HIGH (ref 0.0–100.0)

## 2017-08-22 MED ORDER — POTASSIUM CHLORIDE CRYS ER 20 MEQ PO TBCR
40.0000 meq | EXTENDED_RELEASE_TABLET | Freq: Every day | ORAL | Status: DC
  Administered 2017-08-22 – 2017-08-23 (×2): 40 meq via ORAL
  Filled 2017-08-22 (×2): qty 2

## 2017-08-22 MED ORDER — FUROSEMIDE 10 MG/ML IJ SOLN
80.0000 mg | Freq: Two times a day (BID) | INTRAMUSCULAR | Status: DC
  Administered 2017-08-22 – 2017-08-25 (×6): 80 mg via INTRAVENOUS
  Filled 2017-08-22 (×5): qty 8

## 2017-08-22 MED ORDER — MILRINONE LACTATE IN DEXTROSE 20-5 MG/100ML-% IV SOLN
0.2500 ug/kg/min | INTRAVENOUS | Status: DC
  Administered 2017-08-22 – 2017-08-25 (×4): 0.25 ug/kg/min via INTRAVENOUS
  Filled 2017-08-22 (×5): qty 100

## 2017-08-22 NOTE — Progress Notes (Signed)
ANTICOAGULATION CONSULT NOTE - Initial Consult  Pharmacy Consult for xarelto Indication: atrial fibrillation, continuation of home therapy  Allergies  Allergen Reactions  . Statins Other (See Comments)    Affect her liver, Caused liver problems   . Haldol [Haloperidol Lactate] Rash  . Librax [Chlordiazepoxide-Clidinium] Rash  . Plavix [Clopidogrel Bisulfate] Rash  . Ramipril Rash    Patient Measurements: Height: 5\' 2"  (157.5 cm) Weight: 146 lb 2.6 oz (66.3 kg) IBW/kg (Calculated) : 50.1  Vital Signs: Temp: 97.9 F (36.6 C) (06/13 1957) Temp Source: Oral (06/13 1957) BP: 120/73 (06/13 1957) Pulse Rate: 64 (06/13 1957)  Labs: Recent Labs    08/19/17 1410 08/21/17 1604  HGB 9.6*  --   HCT 31.5*  --   PLT 299  --   APTT  --  27  LABPROT  --  14.0  INR  --  1.09  HEPARINUNFRC  --  0.13*  CREATININE 1.26*  --     Estimated Creatinine Clearance: 28.1 mL/min (A) (by C-G formula based on SCr of 1.26 mg/dL (H)).   Medical History: Past Medical History:  Diagnosis Date  . (HFpEF) heart failure with preserved ejection fraction ([place])    a. 07/2017 Echo: EF 50-55%, no rwma, mild AS/AI, mod MR, mod dil LA, mod TR, PASP 43mmHg.  . Autoimmune hepatitis ([place])    followed by Dr [name]  . Fibrocystic breast disease   . Glaucoma   . Hypercholesterolemia   . Hypertension   . Hypothyroidism   . Inflammatory arthritis   . MI (myocardial infarction) ([place])   . Neuropathy   . Non-obstructive Coronary artery disease    a. 05/2014 NSTEMI/Cath: LM nl, LAD mild dzs, LCX 60ost hazy (? culprit), RCA mild dzs. EF 60% by echo-->Med Rx; b. 08/2017 Cath: LM 30ost, LAD min irregs, LCX small, 40ost/p, RCA large, min irregs, RPDA/RPL1 nl, EF 50-55%. 4+MR.  . Osteoarthritis   . Permanent atrial fibrillation (HCC)    a. CHA2DS2VASc = 6-->Xarelto.  . 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 Severe mitral regurgitation    a. 07/2017 Echo:  Mod MR; b. 08/2017 Cath: 4+/Severe MR.    Medications:  Medications Prior to Admission  Medication Sig Dispense Refill Last Dose  . brimonidine-timolol (COMBIGAN) 0.2-0.5 % ophthalmic solution Place 1 drop into the left eye 2 (two) times daily.    08/21/2017 at 0630  . dorzolamide (TRUSOPT) 2 % ophthalmic solution Place 1 drop into the left eye 2 (two) times daily.   08/21/2017 at 0630  . fluticasone (FLONASE) 50 MCG/ACT nasal spray Place 2 sprays into both nostrils daily as needed for allergies.    Past Week at Unknown time  . furosemide (LASIX) 20 MG tablet Take 1 tablet (20 mg) by mouth once every other day   08/20/2017 at Unknown time  . gabapentin (NEURONTIN) 300 MG capsule TAKE 2 CAPSULES BY MOUTH FOUR TIMES DAILY 720 capsule 0 08/20/2017 at Unknown time  . ipratropium (ATROVENT) 0.06 % nasal spray Place 2 sprays into both nostrils daily as needed for rhinitis.    Past Month at Unknown time  . latanoprost (XALATAN) 0.005 % ophthalmic solution Place 1 drop into both eyes at bedtime.    08/20/2017 at Unknown time  . levothyroxine (SYNTHROID, LEVOTHROID) 112 MCG tablet TAKE 1 TABLET BY MOUTH EVERY DAY BEFORE BREAKFAST 90 tablet 0 08/21/2017 at Unknown time  . metoprolol tartrate (LOPRESSOR) 100 MG tablet Take 1 tablet (  100 mg total) by mouth 2 (two) times daily. 180 tablet 3 08/20/2017 at Unknown time  . pantoprazole (PROTONIX) 40 MG tablet Take 1 tablet (40 mg total) by mouth daily. (Patient taking differently: Take 40 mg by mouth every evening. ) 90 tablet 1 08/20/2017 at Unknown time  . Rivaroxaban (XARELTO) 15 MG TABS tablet Take 1 tablet (15 mg total) by mouth daily with supper. 30 tablet 6 08/18/2017  . traMADol (ULTRAM) 50 MG tablet Take 1 tablet (50 mg total) by mouth 3 (three) times daily as needed (for pain.). (Patient taking differently: Take 50 mg by mouth daily as needed for moderate pain. ) 90 tablet 2 08/20/2017 at Unknown time  . ursodiol (ACTIGALL) 300 MG capsule Take 300-600 mg by mouth  See admin instructions. Take 2 capsules (600 mg) daily after lunch & 1 capsule (300 mg) at night.       Assessment: 84 yo lady s/p heart cath earlier today to resume home xarelto.   Goal of Therapy:  Therapeutic anticoagulation Monitor platelets by anticoagulation protocol: Yes   Plan:  Xarelto 15 mg po daily Monitor for bleeding complications  Lauralee Evener, Zoran [place] 08/22/2017,12:08 AM

## 2017-08-22 NOTE — Discharge Instructions (Signed)

## 2017-08-22 NOTE — Consult Note (Addendum)
Advanced Heart Failure Team History and Physical Note   PCP:  Einar Pheasant, MD  PCP-Cardiology: [name], MD     Reason for Admission: Acute on chronic diastolic CHF/ Severe MR  HPI:    Ruth Roth is a 84 y.o. female with a history of coronary artery disease, chronic diastolic congestive heart failure, persistent atrial fibrillation, hypertension, peptic ulcer disease, fibrocystic breast disease, inflammatory arthritis, autoimmune hepatitis, and neuropathy.  She has been cardioverted 3 times since 12/2016 due to recurrent, highly symptomatic Afib, most recently 06/09/17, but rach time has gone back into Afib. Rate control pursued.   Seen in [facility] clinic 08/19/17. Noted increased DOE with minimal activities. Previously had attempted increasing her diuretic, but hampered by AKI. She denies any CP. C/o 10 lb weight gain and significant BLE edema.  Scheduled for [facility] to further evaluate her symptoms.   Cath 08/21/17 showed Mild-nonobstructive CAD, Severe (4+) MR, severely elevated filling pressures, Mod/Sev PAH, and severely reduced cardiac output as below. Pt admitted to [facility] with plans for transfer to [facility] for further evaluation and treatment by the Advanced Heart Failure Team.   She feels better today after IV lasix yesterday and this am. She states overall she has felt worse since October, since she started having difficulty with her Afib. She remains SOB with any exertion at all, and very fatigued. Has no energy. She denies lightheadedness or dizziness. No CP. She has peripheral edema in her hands and legs up to her thighs. She denies no near syncope.   [place] 08/21/2017 - Mild non-obstructive CAD RHC Procedural Findings: (Obtained from Scan 08/21/17 at 1030) Hemodynamics (mmHg) RA mean 17 RV 68/11 (19) PA 62/27 (44) PCWP 41  AO 147/67 (102) Cardiac Output (Fick) 2.45 Cardiac Index  (Fick) 1.46 PVR 1.23 WU  Review of Systems: [y] = yes, _0  = no   General: Weight gain [y];  Weight loss _1 ; Anorexia _2 ; Fatigue [y]; Fever _3 ; Chills _4 ; Weakness [y]  Cardiac: Chest pain/pressure _5 ; Resting SOB _6 ; Exertional SOB [y]; Orthopnea [y]; Pedal Edema [y]; Palpitations _7 ; Syncope _8 ; Presyncope _9 ; Paroxysmal nocturnal dyspnea_10   Pulmonary: Cough [y]; Wheezing_11 ; Hemoptysis_12 ; Sputum _13 ; Snoring _14   GI: Vomiting_15 ; Dysphagia_16 ; Melena_17 ; Hematochezia _18 ; Heartburn_19 ; Abdominal pain _20 ; Constipation _21 ; Diarrhea _22 ; BRBPR _23   GU: Hematuria_24 ; Dysuria _25 ; Nocturia_26   Vascular: Pain in legs with walking _27 ; Pain in feet with lying flat _28 ; Non-healing sores _29 ; Stroke _30 ; TIA _31 ; Slurred speech _32 ;  Neuro: Headaches_33 ; Vertigo_34 ; Seizures_35 ; Paresthesias_36 ;Blurred vision _37 ; Diplopia _38 ; Vision changes _39   Ortho/Skin: Arthritis [y]; Joint pain [y]; Muscle pain _40 ; Joint swelling _41 ; Back Pain _42 ; Rash _43   Psych: Depression_44 ; Anxiety_45   Heme: Bleeding problems _46 ; Clotting disorders _47 ; Anemia _48   Endocrine: Diabetes _49 ; Thyroid dysfunction_50    Home Medications Prior to Admission medications   Medication Sig Start Date End Date Taking? Authorizing Provider  brimonidine-timolol (COMBIGAN) 0.2-0.5 % ophthalmic solution Place 1 drop into the left eye 2 (two) times daily.  04/03/16   [provider]  dorzolamide (TRUSOPT) 2 % ophthalmic solution Place 1 drop into the left eye 2 (two) times daily.    [provider]  fluticasone (FLONASE) 50 MCG/ACT nasal spray Place  2 sprays into both nostrils daily as needed for allergies.  08/27/16   [provider]  furosemide (LASIX) 20 MG tablet Take 1 tablet (20 mg) by mouth once every other day 08/05/17   [name], MD  gabapentin (NEURONTIN) 300 MG capsule TAKE 2 CAPSULES BY MOUTH FOUR TIMES DAILY 04/03/17   Einar Pheasant, MD  ipratropium (ATROVENT) 0.06 % nasal spray Place 2 sprays into both nostrils daily as needed for rhinitis.  04/24/14   [provider]  latanoprost (XALATAN) 0.005 % ophthalmic solution Place 1 drop into both eyes at bedtime.  10/28/16   [provider]  levothyroxine (SYNTHROID, LEVOTHROID) 112 MCG tablet TAKE 1 TABLET BY MOUTH EVERY DAY BEFORE BREAKFAST 06/24/17   Einar Pheasant, MD  metoprolol tartrate (LOPRESSOR) 100 MG tablet Take 1 tablet (100 mg total) by mouth 2 (two) times daily. 06/27/17 09/25/17  [name], MD  pantoprazole (PROTONIX) 40 MG tablet Take 1 tablet (40 mg total) by mouth daily. Patient taking differently: Take 40 mg by mouth every evening.  03/12/17   Einar Pheasant, MD  Rivaroxaban (XARELTO) 15 MG TABS tablet Take 1 tablet (15 mg total) by mouth daily with supper. 07/28/17   [name], MD  traMADol (ULTRAM) 50 MG tablet Take 1 tablet (50 mg total) by mouth 3 (three) times daily as needed (for pain.). Patient taking differently: Take 50 mg by mouth daily as needed for moderate pain.  05/29/17 05/29/18  Einar Pheasant, MD  ursodiol (ACTIGALL) 300 MG capsule Take 300-600 mg by mouth See admin instructions. Take 2 capsules (600 mg) daily after lunch & 1 capsule (300 mg) at night.    [provider]    Past Medical History: Past Medical History:  Diagnosis Date  . (HFpEF) heart failure with preserved ejection fraction ([place])    a. 07/2017 Echo: EF 50-55%, no rwma, mild AS/AI, mod MR, mod dil LA, mod TR, PASP 51mHg.  . Autoimmune hepatitis ([place]    followed by Dr SGustavo Lah . Fibrocystic breast disease   . Glaucoma   . Hypercholesterolemia   . Hypertension   . Hypothyroidism   . Inflammatory arthritis   . MI (myocardial infarction) ([place]   . Neuropathy   . Non-obstructive Coronary artery disease    a. 05/2014 NSTEMI/Cath: LM nl, LAD mild dzs, LCX 60ost hazy (? culprit), RCA mild dzs. EF 60% by echo-->Med Rx; b. 08/2017 Cath: LM 30ost, LAD min irregs, LCX small, 40ost/p, RCA large, min irregs, RPDA/RPL1 nl, EF 50-55%. 4+MR.  . Osteoarthritis   . Permanent atrial  fibrillation (HCC)    a. CHA2DS2VASc = 6-->Xarelto.  . 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 Severe mitral regurgitation    a. 07/2017 Echo: Mod MR; b. 08/2017 Cath: 4+/Severe MR.    Past Surgical History: Past Surgical History:  Procedure Laterality Date  . ABDOMINAL HYSTERECTOMY  1963   partial, secondary to fibroids  . APPENDECTOMY    . Billroth    . CARDIAC CATHETERIZATION  05/12/14   [facility]  . CARDIOVERSION N/A 12/26/2016   Procedure: CARDIOVERSION;  Surgeon: AWellington Hampshire MD;  Location: [facility] ORS;  Service: Cardiovascular;  Laterality: N/A;  . CARDIOVERSION N/A 05/16/2017   Procedure: CARDIOVERSION;  Surgeon: AWellington Hampshire MD;  Location: [facility] ORS;  Service: Cardiovascular;  Laterality: N/A;  . CARDIOVERSION N/A 06/09/2017   Procedure: CARDIOVERSION;  Surgeon: AWellington Hampshire MD;  [facility];  Service: Cardiovascular;  Laterality: N/A;  . CHOLECYSTECTOMY  1998  . COLONOSCOPY  2009  . RIGHT/LEFT HEART CATH AND CORONARY ANGIOGRAPHY Bilateral 08/21/2017   Procedure: RIGHT/LEFT HEART CATH AND CORONARY ANGIOGRAPHY;  Surgeon: [name], MD;  Location: [facility];  Service: Cardiovascular;  Laterality: Bilateral;  . UPPER GI ENDOSCOPY  2004    Family History:  Family History  Problem Relation Age of Onset  . Stroke Mother   . Esophageal cancer Brother        also had lung cancer  . Cancer Daughter        colon  . Ovarian cancer Daughter   . Breast cancer Neg Hx   . Colon cancer Neg Hx     Social History: Social History   Socioeconomic History  . Marital status: Widowed    Spouse name: Not on file  . Number of children: Not on file  . Years of education: Not on file  . Highest education level: Not on file  Occupational History  . Not on file  Social Needs  . Financial resource strain: Not on file  . Food insecurity:    Worry: Not on file    Inability: Not on file  .  Transportation needs:    Medical: Not on file    Non-medical: Not on file  Tobacco Use  . Smoking status: Never Smoker  . Smokeless tobacco: Never Used  Substance and Sexual Activity  . Alcohol use: No    Alcohol/week: 0.0 oz  . Drug use: No  . Sexual activity: Never  Lifestyle  . Physical activity:    Days per week: Not on file    Minutes per session: Not on file  . Stress: Not on file  Relationships  . Social connections:    Talks on phone: Not on file    Gets together: Not on file    Attends religious service: Not on file    Active member of club or organization: Not on file    Attends meetings of clubs or organizations: Not on file    Relationship status: Not on file  Other Topics Concern  . Not on file  Social History Narrative  . Not on file    Allergies:  Allergies  Allergen Reactions  . Statins Other (See Comments)    Affect her liver, Caused liver problems   . Haldol [Haloperidol Lactate] Rash  . Librax [Chlordiazepoxide-Clidinium] Rash  . Plavix [Clopidogrel Bisulfate] Rash  . Ramipril Rash    Objective:    Vital Signs:   Temp:  [97.9 F (36.6 C)-98.4 F (36.9 C)] 98.2 F (36.8 C) (06/14 0830) Pulse Rate:  [59-126] 70 (06/14 0941) Resp:  [14-23] 20 (06/14 0441) BP: (118-146)/(59-97) 120/60 (06/14 0830) SpO2:  [89 %-99 %] 97 % (06/14 0830) Weight:  [145 lb 1.6 oz (65.8 kg)-146 lb 2.6 oz (66.3 kg)] 145 lb 1.6 oz (65.8 kg) (06/14 0441) Last BM Date: 08/21/17 Filed Weights   08/21/17 1845 08/22/17 0441  Weight: 146 lb 2.6 oz (66.3 kg) 145 lb 1.6 oz (65.8 kg)     Physical Exam     General:  Elderly appearing, NAD. appearing. No respiratory difficulty HEENT: Normal Neck: Supple. JVP 9-10 cm at least. Carotids 2+ bilat; no bruits. No lymphadenopathy or thyromegaly appreciated. Cor: PMI nondisplaced. Irregularly irregular, 2/6 MR  Lungs: Clear Abdomen: Soft, nontender, nondistended. No hepatosplenomegaly. No bruits or masses. Good bowel  sounds. Extremities: No cyanosis, clubbing, or rash. 2-3+  BLE edema.  Neuro: Alert & oriented x 3, cranial nerves grossly intact. moves all 4 extremities w/o difficulty. Affect pleasant.  Telemetry    Afib 80s, personally reviewed.   EKG   08/22/17 Afib 83 bpm with competing junctional pace maker, personally reviewed  Labs     Basic Metabolic Panel: Recent Labs  Lab 08/19/17 1410 08/21/17 2325 08/22/17 0349  NA 138 140 136  K 4.5 4.3 3.7  CL 107* 106 108  CO2 18* 25 22  GLUCOSE 85 107* 94  BUN 31* 31* 30*  CREATININE 1.26* 1.52* 1.40*  CALCIUM 8.4* 8.5* 7.9*  MG  --  1.8  --     Liver Function Tests: Recent Labs  Lab 08/21/17 2325  AST 52*  ALT 98*  ALKPHOS 111  BILITOT 0.5  PROT 5.9*  ALBUMIN 3.3*   No results for input(s): LIPASE, AMYLASE in the last 168 hours. No results for input(s): AMMONIA in the last 168 hours.  CBC: Recent Labs  Lab 08/19/17 1410 08/21/17 2325  WBC 7.7 7.0  NEUTROABS  --  3.9  HGB 9.6* 10.0*  HCT 31.5* 34.5*  MCV 85 88.5  PLT 299 259    Cardiac Enzymes: No results for input(s): CKTOTAL, CKMB, CKMBINDEX, TROPONINI in the last 168 hours.  BNP: BNP (last 3 results) Recent Labs    08/21/17 2325  BNP 1,241.1*    ProBNP (last 3 results) No results for input(s): PROBNP in the last 8760 hours.   CBG: No results for input(s): GLUCAP in the last 168 hours.  Coagulation Studies: Recent Labs    08/21/17 1604 08/21/17 2325  LABPROT 14.0 16.7*  INR 1.09 1.36   Imaging: Dg Chest 2 View  Result Date: 08/22/2017 CLINICAL DATA:  Congestive heart failure.  Mitral regurgitation. EXAM: CHEST - 2 VIEW COMPARISON:  November 22, 2016 FINDINGS: There is moderate interstitial edema. No airspace consolidation. There is a minimal right pleural effusion. There is cardiomegaly with mild pulmonary venous hypertension. No adenopathy. There is aortic atherosclerosis. Bones are osteoporotic. There are surgical clips at the  gastroesophageal junction. IMPRESSION: Pulmonary vascular congestion with interstitial edema and small right pleural effusion. Suspect a degree of congestive heart failure. No consolidation evident. There is aortic atherosclerosis.  Bones are osteoporotic. Aortic Atherosclerosis (ICD10-I70.0). Electronically Signed   By: Lowella Grip III M.D.   On: 08/22/2017 08:04     Patient Profile   84 year old female with a history of coronary artery disease, chronic diastolic congestive heart failure, persistent atrial fibrillation, hypertension, peptic ulcer disease, fibrocystic breast disease, inflammatory arthritis, autoimmune hepatitis, and neuropathy,   Admitted to [facility] 08/21/17 s/p R/LHC, and then transferred to [facility].   Assessment/Plan   1.  Acute on chronic diastolic congestive heart failure:  - Echo 08/01/17 LVEF 50-55%, Mild AS, Mod MR, Mod LAE, Mild RAE, Mod TR, PA peak pressure 65 mm Hg. - Volume status elevated on exam.  - Cath 08/21/17 with signficantly elevated filling pressures, severe MR, and low cardiac output - Increase IV lasix to 80 mg mg BID for now.  - Will add 0.25 mcg/kg/min Milrinone to lower pulmonary pressures and augment diuresis/support RV.  - Follow renal function closely - Has been on Toprol 100 mg BID for rate control.  Continue for now. Low threshold to stop.   2.  Severe/4+ mitral regurgitation:  - Echo 08/01/17 LVEF 50-55%, Mild AS, Mod MR, Mod LAE, Mild RAE, Mod TR, PA peak pressure 65 mm Hg. MR ++ calcified, and RV  mild/moderately reduced function.  - Will discuss with Dr. Haroldine Laws. May need TEE to fully assess.  - Will discuss candidacy for MitraClip.   3.  Persistent atrial fibrillation: - Has previously failed amiodarone and DCCV.  - BB as above - ON Xarelto PTA.  - Continue heparin.   4.  Essential hypertension:  - Meds as above.    Shirley Friar, PA-C 08/22/2017, 10:51 AM  Advanced Heart Failure Team Pager [phone] (M-F; 7a - 4p)   Please contact [facility] for night-coverage after hours (4p -7a ) and weekends on [email or web address]   Patient seen and examined with the above-signed Advanced Practice Provider and/or Housestaff. I personally reviewed laboratory data, imaging studies and relevant notes. I independently examined the patient and formulated the important aspects of the plan. I have edited the note to reflect any of my changes or salient points. I have personally discussed the plan with the patient and/or family.  84 yo active woman with h/o CAD, diastolic HF, persistent AF transferred from [facility] with refractory HF and severe MR for consideration of MV Clip.  Was foing very well until 6 months ago when she started having AF. Has been cardioverted multiple times but has failed to maintain NSR. Despite AA therapy. Now with class IB-IV HF symptoms despite maximal medical therapy.   Had cath this week with non-obstructive CAD. RHC with very high filling pressures and markedly depressed CO. Echo reviewed personally. LVEF is normal. MV thickened with severe MAC. Mod to severe MR> RV at least moderately down.   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 Now with low output HF. Will start milrinone and proceed with IV diuresis. Plan TEE Monday to better assess MV. Will discuss with structural heart team.   Glori Bickers, MD  5:46 PM

## 2017-08-22 NOTE — Discharge Summary (Signed)
Discharge Summary (For [facility] Admission 08/21/2017)    [name],  MRN: [id number], DOB/AGE: 82-Jul-1930 82 y.o.  Admit date: 08/21/2017 Discharge date: 08/22/2017  Primary Care Provider: Einar Pheasant Primary Cardiologist: [name], MD  Discharge Diagnoses    Principal Problem:   Acute on chronic diastolic (congestive) heart failure [facility]) Active Problems:   Mitral valve insufficiency   Pulmonary hypertension, unspecified (HCC)   Acute on chronic diastolic CHF (congestive heart failure) (HCC)   Allergies Allergies  Allergen Reactions  . Statins Other (See Comments)    Affect her liver, Caused liver problems   . Haldol [Haloperidol Lactate] Rash  . Librax [Chlordiazepoxide-Clidinium] Rash  . Plavix [Clopidogrel Bisulfate] Rash  . Ramipril Rash    Diagnostic Studies/Procedures    Cardiac Catheterization 6.13.2019  Left Main  Ost LM lesion 30% stenosed  Ost LM lesion is 30% stenosed.  Left Anterior Descending  The vessel exhibits minimal luminal irregularities.  Left Circumflex  Vessel is small.  Ost Cx to Prox Cx lesion 40% stenosed  Ost Cx to Prox Cx lesion is 40% stenosed.  Right Coronary Artery  Vessel is large. There is mild diffuse disease throughout the vessel.  Right Posterior Descending Artery  Vessel is angiographically normal.  First Right Posterolateral  Vessel is angiographically normal.   Left Ventricle The left ventricular size is normal. The left ventricular systolic function is normal. LV end diastolic pressure is moderately elevated. The left ventricular ejection fraction is 50-55% by visual estimate. No regional wall motion abnormalities. There is severe (4+) mitral regurgitation.  _____________   History of Present Illness     84 year old female with the above complex past medical history including coronary artery disease, chronic diastolic congestive heart failure, persistent atrial fibrillation on Xarelto,  hypertension, peptic ulcer disease, fibrocystic breast disease, inflammatory arthritis, autoimmune hepatitis, and neuropathy.  She had a non-STEMI in March 2016 with acute diastolic heart failure after GI illness.  Echo showed normal LV function.  Catheterization was carried out and showed an ostial 6% left circumflex stenosis with minimal LAD and RCA disease.  She was treated medically.  She has required multiple cardioversions in the setting of symptomatic atrial fibrillation.  Earlier this year, she was placed on amiodarone however this was discontinued due to recurrent atrial fibrillation with election for rate control.  Despite rate control with beta-blocker therapy, she has been having worsening exertional dyspnea.  Recent echocardiogram showed stable LV function with mild aortic stenosis and moderate mitral regurgitation as well as severe pulmonary hypertension.  Estimated pulmonary artery systolic pressure was 65 mmHg.  She did not respond well to increasing outpatient diuretic dosing and decision was made to pursue diagnostic catheterization.  Hospital Course     Consultants: None   Pt underwent diagnostic cath @ [facility] on 6/13, revealing minimal nonobstructive CAD with 4+ mitral regurgitation and elevated left ventricular end-diastolic pressure of 92-11HERD. As a result, she was briefly admitted to [facility] until a bed became available @ [facility], as we wished for her to have further evaluation by the CHF team.  She was placed on low dose IV lasix (previously very responsive to lasix) and then transferred to [facility] in stable condition on the evening of 6/13. _____________  Discharge Vitals Blood pressure 128/83, pulse 87, temperature 98.3 F (36.8 C), temperature source Oral, resp. rate 18, height 5\' 2"  (1.575 m), weight 146 lb (66.2 kg), SpO2 98 %.  Filed Weights   08/21/17 0745 08/21/17 1521  Weight: 147 lb (66.7 kg) 146 lb (66.2 kg)    Labs & Radiologic Studies    CBC Recent Labs     08/19/17 1410 08/21/17 2325  WBC 7.7 7.0  NEUTROABS  --  3.9  HGB 9.6* 10.0*  HCT 31.5* 34.5*  MCV 85 88.5  PLT 299 315   Basic Metabolic Panel Recent Labs    08/21/17 2325 08/22/17 0349  NA 140 136  K 4.3 3.7  CL 106 108  CO2 25 22  GLUCOSE 107* 94  BUN 31* 30*  CREATININE 1.52* 1.40*  CALCIUM 8.5* 7.9*  MG 1.8  --    Liver Function Tests Recent Labs    08/21/17 2325  AST 52*  ALT 98*  ALKPHOS 111  BILITOT 0.5  PROT 5.9*  ALBUMIN 3.3*   Thyroid Function Tests Recent Labs    08/21/17 2325  TSH 1.888   _____________  Dg Chest 2 View  Result Date: 08/22/2017 CLINICAL DATA:  Congestive heart failure.  Mitral regurgitation. EXAM: CHEST - 2 VIEW COMPARISON:  November 22, 2016 FINDINGS: There is moderate interstitial edema. No airspace consolidation. There is a minimal right pleural effusion. There is cardiomegaly with mild pulmonary venous hypertension. No adenopathy. There is aortic atherosclerosis. Bones are osteoporotic. There are surgical clips at the gastroesophageal junction. IMPRESSION: Pulmonary vascular congestion with interstitial edema and small right pleural effusion. Suspect a degree of congestive heart failure. No consolidation evident. There is aortic atherosclerosis.  Bones are osteoporotic. Aortic Atherosclerosis (ICD10-I70.0). Electronically Signed   By: Lowella Grip III M.D.   On: 08/22/2017 08:04   Disposition   Pt is being discharged home today in good condition.  Follow-up Plans & Appointments   F/u plans to be determined upon d/c from [facility].  Discharge Medications   Allergies as of 08/21/2017      Reactions   Statins Other (See Comments)   Affect her liver, Caused liver problems   Haldol [haloperidol Lactate] Rash   Librax [chlordiazepoxide-clidinium] Rash   Plavix [clopidogrel Bisulfate] Rash   Ramipril Rash      Medication List on Transfer    ASK your doctor about these medications   COMBIGAN 0.2-0.5 % ophthalmic  solution Generic drug:  brimonidine-timolol Place 1 drop into the left eye 2 (two) times daily.   dorzolamide 2 % ophthalmic solution Commonly known as:  TRUSOPT Place 1 drop into the left eye 2 (two) times daily.   fluticasone 50 MCG/ACT nasal spray Commonly known as:  FLONASE Place 2 sprays into both nostrils daily as needed for allergies.   furosemide 20 MG IV Commonly known as:  LASIX 20mg  IV daily   gabapentin 300 MG capsule Commonly known as:  NEURONTIN TAKE 2 CAPSULES BY MOUTH FOUR TIMES DAILY   ipratropium 0.06 % nasal spray Commonly known as:  ATROVENT Place 2 sprays into both nostrils daily as needed for rhinitis.   latanoprost 0.005 % ophthalmic solution Commonly known as:  XALATAN Place 1 drop into both eyes at bedtime.   levothyroxine 112 MCG tablet Commonly known as:  SYNTHROID, LEVOTHROID TAKE 1 TABLET BY MOUTH EVERY DAY BEFORE BREAKFAST   metoprolol tartrate 100 MG tablet Commonly known as:  LOPRESSOR Take 1 tablet (100 mg total) by mouth 2 (two) times daily.   pantoprazole 40 MG tablet Commonly known as:  PROTONIX Take 1 tablet (40 mg total) by mouth daily.   Heparin per pharmacy (Xarelto on hold)   traMADol 50 MG tablet Commonly known as:  Veatrice Bourbon  Take 1 tablet (50 mg total) by mouth 3 (three) times daily as needed (for pain.).        Acute coronary syndrome (MI, NSTEMI, STEMI, etc) this admission?: No.    Outstanding Labs/Studies   Pt to be followed @ [facility]  Duration of Discharge Encounter   Greater than 30 minutes including physician time.  Signed, Darl Pikes, NP 08/22/2017, 12:05 PM

## 2017-08-23 DIAGNOSIS — I482 Chronic atrial fibrillation: Secondary | ICD-10-CM

## 2017-08-23 LAB — BASIC METABOLIC PANEL
ANION GAP: 7 (ref 5–15)
BUN: 31 mg/dL — ABNORMAL HIGH (ref 6–20)
CALCIUM: 8.1 mg/dL — AB (ref 8.9–10.3)
CHLORIDE: 102 mmol/L (ref 101–111)
CO2: 27 mmol/L (ref 22–32)
Creatinine, Ser: 1.33 mg/dL — ABNORMAL HIGH (ref 0.44–1.00)
GFR calc non Af Amer: 35 mL/min — ABNORMAL LOW (ref >60)
GFR, EST AFRICAN AMERICAN: 40 mL/min — AB (ref >60)
Glucose, Bld: 98 mg/dL (ref 65–99)
POTASSIUM: 3.9 mmol/L (ref 3.5–5.1)
Sodium: 136 mmol/L (ref 135–145)

## 2017-08-23 LAB — MAGNESIUM: Magnesium: 1.6 mg/dL — ABNORMAL LOW (ref 1.7–2.4)

## 2017-08-23 MED ORDER — POTASSIUM CHLORIDE CRYS ER 10 MEQ PO TBCR
20.0000 meq | EXTENDED_RELEASE_TABLET | Freq: Two times a day (BID) | ORAL | Status: DC
  Administered 2017-08-23 – 2017-08-28 (×10): 20 meq via ORAL
  Filled 2017-08-23 (×13): qty 2

## 2017-08-23 NOTE — Progress Notes (Signed)
Advanced Heart Failure Rounding Note   Subjective:     Started on milrinone 6/14 with IV lasix. Weight down 8 pounds. Breathing better. No orthopnea or PND.  Remains in AF. Rate about 100.    Objective:   Weight Range:  Vital Signs:   Temp:  [96.8 F (36 C)-97.7 F (36.5 C)] 97.7 F (36.5 C) (06/15 4235) Pulse Rate:  [93-116] 93 (06/15 1014) Resp:  [13-19] 19 (06/15 0633) BP: (124-128)/(73-87) 124/73 (06/15 1014) SpO2:  [97 %-100 %] 97 % (06/15 3614) Weight:  [62.5 kg (137 lb 11.2 oz)] 62.5 kg (137 lb 11.2 oz) (06/15 0633) Last BM Date: 08/21/17  Weight change: Filed Weights   08/21/17 1845 08/22/17 0441 08/23/17 0633  Weight: 66.3 kg (146 lb 2.6 oz) 65.8 kg (145 lb 1.6 oz) 62.5 kg (137 lb 11.2 oz)    Intake/Output:   Intake/Output Summary (Last 24 hours) at 08/23/2017 1407 Last data filed at 08/23/2017 1013 Gross per 24 hour  Intake 177.11 ml  Output 5050 ml  Net -4872.89 ml     Physical Exam: General:  Sitting up in bed No resp difficulty HEENT: normal Neck: supple. JVP jaw . Carotids 2+ bilat; no bruits. No lymphadenopathy or thryomegaly appreciated. Cor: PMI nondisplaced. Irregular rate & rhythm. I do not hear MR well Lungs: clear Abdomen: soft, nontender, nondistended. No hepatosplenomegaly. No bruits or masses. Good bowel sounds. Extremities: no cyanosis, clubbing, rash, 2+ edema Neuro: alert & orientedx3, cranial nerves grossly intact. moves all 4 extremities w/o difficulty. Affect pleasant  Telemetry: AF 90-100 Personally reviewed   Labs: Basic Metabolic Panel: Recent Labs  Lab 08/19/17 1410 08/21/17 2325 08/22/17 0349 08/23/17 0242  NA 138 140 136 136  K 4.5 4.3 3.7 3.9  CL 107* 106 108 102  CO2 18* 25 22 27   GLUCOSE 85 107* 94 98  BUN 31* 31* 30* 31*  CREATININE 1.26* 1.52* 1.40* 1.33*  CALCIUM 8.4* 8.5* 7.9* 8.1*  MG  --  1.8  --  1.6*    Liver Function Tests: Recent Labs  Lab 08/21/17 2325  AST 52*  ALT 98*  ALKPHOS 111    BILITOT 0.5  PROT 5.9*  ALBUMIN 3.3*   No results for input(s): LIPASE, AMYLASE in the last 168 hours. No results for input(s): AMMONIA in the last 168 hours.  CBC: Recent Labs  Lab 08/19/17 1410 08/21/17 2325  WBC 7.7 7.0  NEUTROABS  --  3.9  HGB 9.6* 10.0*  HCT 31.5* 34.5*  MCV 85 88.5  PLT 299 259    Cardiac Enzymes: No results for input(s): CKTOTAL, CKMB, CKMBINDEX, TROPONINI in the last 168 hours.  BNP: BNP (last 3 results) Recent Labs    08/21/17 2325  BNP 1,241.1*    ProBNP (last 3 results) No results for input(s): PROBNP in the last 8760 hours.    Other results:  Imaging: Dg Chest 2 View  Result Date: 08/22/2017 CLINICAL DATA:  Congestive heart failure.  Mitral regurgitation. EXAM: CHEST - 2 VIEW COMPARISON:  November 22, 2016 FINDINGS: There is moderate interstitial edema. No airspace consolidation. There is a minimal right pleural effusion. There is cardiomegaly with mild pulmonary venous hypertension. No adenopathy. There is aortic atherosclerosis. Bones are osteoporotic. There are surgical clips at the gastroesophageal junction. IMPRESSION: Pulmonary vascular congestion with interstitial edema and small right pleural effusion. Suspect a degree of congestive heart failure. No consolidation evident. There is aortic atherosclerosis.  Bones are osteoporotic. Aortic Atherosclerosis (ICD10-I70.0). Electronically Signed  By: Lowella Grip III M.D.   On: 08/22/2017 08:04      Medications:     Scheduled Medications: . brimonidine  2 drop Left Eye BID  . dorzolamide  1 drop Left Eye BID  . furosemide  80 mg Intravenous BID  . gabapentin  600 mg Oral QID  . latanoprost  1 drop Both Eyes QHS  . levothyroxine  112 mcg Oral QAC breakfast  . metoprolol tartrate  100 mg Oral BID  . pantoprazole  40 mg Oral QPM  . potassium chloride  40 mEq Oral Daily  . rivaroxaban  15 mg Oral Q supper  . sodium chloride flush  3 mL Intravenous Q12H  . timolol  1 drop  Left Eye BID  . ursodiol  300 mg Oral QHS  . ursodiol  600 mg Oral Q24H     Infusions: . sodium chloride    . milrinone 0.25 mcg/kg/min (08/23/17 0641)     PRN Medications:  sodium chloride, acetaminophen, fluticasone, ondansetron (ZOFRAN) IV, sodium chloride flush, traMADol   Assessment:   84 year old female with a history of coronary artery disease, chronic diastolic congestive heart failure, persistent atrial fibrillation, hypertension, peptic ulcer disease, fibrocystic breast disease, inflammatory arthritis, autoimmune hepatitis, and neuropathy,   Admitted to [facility] 08/21/17 s/p R/LHC, and then transferred to [facility] on 6/14 for consideration of MitraClip   Plan/Discussion:    1.Acute on chronic diastolic congestive heart failure with low output:  - Echo 08/01/17 LVEF 50-55%, Mild AS, Mod MR, Mod LAE, Mild RAE, Mod TR, RV moderately HK PA peak pressure 65 mm Hg. - Cath 08/21/17 with signficantly elevated filling pressures, severe MR, and low cardiac output - Had cardiorenal syndrome on admit with severe MR and RV failure - Improving on milrinone. Will continue.  - Volume status improving but still elavated. Continue lasix 80 IV bid - Creatinine improving - Has been on Toprol 100 mg BID for rate control.  Continue for now. Low threshold to decrease  2. Severe mitral regurgitation:  - Echo 08/01/17 LVEF 50-55%, Mild AS, Mod MR, Mod LAE, Mild RAE, Mod TR, PA peak pressure 65 mm Hg. MR ++ calcified, and RV mild/moderately reduced function.  - Plan TEE Monday to better assess MV. Will discuss with structural heart team. MV calcification and RV dysfunction may be limiting factors  3. Persistent atrial fibrillation: - Has previously failed amiodarone and DCCV.  - BB as above - On Xarelto   4. Essential hypertension:  - Meds as above.   5. AKI - due to cardiorenal syndrome. Improving with milrinone  Length of Stay: 2   Glori Bickers MD 08/23/2017, 2:07  PM  Advanced Heart Failure Team Pager [phone] (M-F; 7a - 4p)  Please contact [facility] for night-coverage after hours (4p -7a ) and weekends on [email or web address]

## 2017-08-24 LAB — BASIC METABOLIC PANEL
ANION GAP: 8 (ref 5–15)
BUN: 31 mg/dL — AB (ref 6–20)
CHLORIDE: 96 mmol/L — AB (ref 101–111)
CO2: 32 mmol/L (ref 22–32)
Calcium: 8.3 mg/dL — ABNORMAL LOW (ref 8.9–10.3)
Creatinine, Ser: 1.4 mg/dL — ABNORMAL HIGH (ref 0.44–1.00)
GFR calc Af Amer: 38 mL/min — ABNORMAL LOW (ref >60)
GFR calc non Af Amer: 33 mL/min — ABNORMAL LOW (ref >60)
GLUCOSE: 107 mg/dL — AB (ref 65–99)
POTASSIUM: 4.2 mmol/L (ref 3.5–5.1)
Sodium: 136 mmol/L (ref 135–145)

## 2017-08-24 MED ORDER — TRAMADOL HCL 50 MG PO TABS
50.0000 mg | ORAL_TABLET | Freq: Three times a day (TID) | ORAL | Status: DC | PRN
  Administered 2017-08-24 – 2017-08-28 (×8): 50 mg via ORAL
  Filled 2017-08-24 (×8): qty 1

## 2017-08-24 NOTE — Progress Notes (Signed)
Advanced Heart Failure Rounding Note   Subjective:     Started on milrinone 6/14 with IV lasix. Weight down another 6 pounds. (total 14 pounds)   Feels much better. No orthopnea or PND  No dizziness. Remains in AF. Rates 80-90  Creatinine stable 1.3-1.4 range  K 4.2   Objective:   Weight Range:  Vital Signs:   Temp:  [97.8 F (36.6 C)-98.1 F (36.7 C)] 97.8 F (36.6 C) (06/16 0552) Pulse Rate:  [93-118] 97 (06/16 0827) Resp:  [15-24] 24 (06/16 0552) BP: (111-124)/(57-73) 112/62 (06/16 0827) SpO2:  [96 %-100 %] 96 % (06/16 0552) Weight:  [59.6 kg (131 lb 6.4 oz)] 59.6 kg (131 lb 6.4 oz) (06/16 0552) Last BM Date: 08/21/17  Weight change: Filed Weights   08/22/17 0441 08/23/17 0633 08/24/17 0552  Weight: 65.8 kg (145 lb 1.6 oz) 62.5 kg (137 lb 11.2 oz) 59.6 kg (131 lb 6.4 oz)    Intake/Output:   Intake/Output Summary (Last 24 hours) at 08/24/2017 0945 Last data filed at 08/24/2017 0600 Gross per 24 hour  Intake 255.3 ml  Output 3100 ml  Net -2844.7 ml     Physical Exam: General:  Sitting in chair No resp difficulty HEENT: normal Neck: supple. JVP 8-9. Carotids 2+ bilat; no bruits. No lymphadenopathy or thryomegaly appreciated. Cor: PMI nondisplaced. Irregular rate & rhythm. 2/6 AS I do not hear MR well  Lungs: clear Abdomen: soft, nontender, nondistended. No hepatosplenomegaly. No bruits or masses. Good bowel sounds. Extremities: no cyanosis, clubbing, rash, 1+ edema Neuro: alert & orientedx3, cranial nerves grossly intact. moves all 4 extremities w/o difficulty. Affect pleasant  Telemetry: AF 80-90 Personally reviewed   Labs: Basic Metabolic Panel: Recent Labs  Lab 08/19/17 1410 08/21/17 2325 08/22/17 0349 08/23/17 0242 08/24/17 0335  NA 138 140 136 136 136  K 4.5 4.3 3.7 3.9 4.2  CL 107* 106 108 102 96*  CO2 18* 25 22 27  32  GLUCOSE 85 107* 94 98 107*  BUN 31* 31* 30* 31* 31*  CREATININE 1.26* 1.52* 1.40* 1.33* 1.40*  CALCIUM 8.4* 8.5* 7.9*  8.1* 8.3*  MG  --  1.8  --  1.6*  --     Liver Function Tests: Recent Labs  Lab 08/21/17 2325  AST 52*  ALT 98*  ALKPHOS 111  BILITOT 0.5  PROT 5.9*  ALBUMIN 3.3*   No results for input(s): LIPASE, AMYLASE in the last 168 hours. No results for input(s): AMMONIA in the last 168 hours.  CBC: Recent Labs  Lab 08/19/17 1410 08/21/17 2325  WBC 7.7 7.0  NEUTROABS  --  3.9  HGB 9.6* 10.0*  HCT 31.5* 34.5*  MCV 85 88.5  PLT 299 259    Cardiac Enzymes: No results for input(s): CKTOTAL, CKMB, CKMBINDEX, TROPONINI in the last 168 hours.  BNP: BNP (last 3 results) Recent Labs    08/21/17 2325  BNP 1,241.1*    ProBNP (last 3 results) No results for input(s): PROBNP in the last 8760 hours.    Other results:  Imaging: No results found.   Medications:     Scheduled Medications: . brimonidine  2 drop Left Eye BID  . dorzolamide  1 drop Left Eye BID  . furosemide  80 mg Intravenous BID  . gabapentin  600 mg Oral QID  . latanoprost  1 drop Both Eyes QHS  . levothyroxine  112 mcg Oral QAC breakfast  . metoprolol tartrate  100 mg Oral BID  . pantoprazole  40 mg Oral QPM  . potassium chloride  20 mEq Oral BID  . rivaroxaban  15 mg Oral Q supper  . sodium chloride flush  3 mL Intravenous Q12H  . timolol  1 drop Left Eye BID  . ursodiol  300 mg Oral QHS  . ursodiol  600 mg Oral Q24H    Infusions: . sodium chloride    . milrinone 0.25 mcg/kg/min (08/24/17 0600)    PRN Medications: sodium chloride, acetaminophen, fluticasone, ondansetron (ZOFRAN) IV, sodium chloride flush, traMADol   Assessment:   84 year old female with a history of coronary artery disease, chronic diastolic congestive heart failure, persistent atrial fibrillation, hypertension, peptic ulcer disease, fibrocystic breast disease, inflammatory arthritis, autoimmune hepatitis, and neuropathy,   Admitted to [facility] 08/21/17 s/p R/LHC, and then transferred to [facility] on 6/14 for consideration of  MitraClip   Plan/Discussion:    1.Acute on chronic diastolic congestive heart failure with low output:  - Echo 08/01/17 LVEF 50-55%, Mild AS, Mod MR, Mod LAE, Mild RAE, Mod TR, RV moderately HK PA peak pressure 65 mm Hg. - Cath 08/21/17 with signficantly elevated filling pressures, severe MR, and low cardiac output - Had cardiorenal syndrome on admit with severe MR and RV failure - Improving on milrinone. Will continue.  - Volume status much improved. Weight down 14 pounds. Still overloaded. Will diurese one more day.  - Creatinine stabilized - Has been on Toprol 100 mg BID for rate control.  Continue for now. Low threshold to decrease  2. Severe mitral regurgitation:  - Echo 08/01/17 LVEF 50-55%, Mild AS, Mod MR, Mod LAE, Mild RAE, Mod TR, PA peak pressure 65 mm Hg. MR ++ calcified, and RV mild/moderately reduced function.  - Plan TEE tomorrow to better assess MV. Will discuss with structural heart team. MV calcification and RV dysfunction may be limiting factors  3. Persistent atrial fibrillation: - Has previously failed amiodarone and DCCV.  - BB as above - On Xarelto   4. Essential hypertension:  - Meds as above.   5. AKI - due to cardiorenal syndrome. Improving with milrinone.Creatinine stable at 1.4 today.   Length of Stay: 3   Glori Bickers MD 08/24/2017, 9:45 AM  Advanced Heart Failure Team Pager [phone] (M-F; [place])  Please contact [facility] for night-coverage after hours (4p -7a ) and weekends on [email or web address]

## 2017-08-25 ENCOUNTER — Encounter (HOSPITAL_COMMUNITY): Payer: Self-pay | Admitting: Certified Registered Nurse Anesthetist

## 2017-08-25 ENCOUNTER — Encounter (HOSPITAL_COMMUNITY): Payer: Self-pay

## 2017-08-25 ENCOUNTER — Ambulatory Visit (HOSPITAL_COMMUNITY): Payer: Medicare Other

## 2017-08-25 ENCOUNTER — Encounter (HOSPITAL_COMMUNITY)
Admission: AD | Disposition: A | Discharge status: Home Health | Payer: Self-pay | Source: Other Acute Inpatient Hospital | Attending: Cardiology

## 2017-08-25 DIAGNOSIS — I340 Nonrheumatic mitral (valve) insufficiency: Secondary | ICD-10-CM

## 2017-08-25 HISTORY — PX: TEE WITHOUT CARDIOVERSION: SHX5443

## 2017-08-25 LAB — BASIC METABOLIC PANEL
ANION GAP: 10 (ref 5–15)
BUN: 31 mg/dL — ABNORMAL HIGH (ref 6–20)
CHLORIDE: 94 mmol/L — AB (ref 101–111)
CO2: 32 mmol/L (ref 22–32)
Calcium: 8.2 mg/dL — ABNORMAL LOW (ref 8.9–10.3)
Creatinine, Ser: 1.32 mg/dL — ABNORMAL HIGH (ref 0.44–1.00)
GFR calc non Af Amer: 35 mL/min — ABNORMAL LOW (ref >60)
GFR, EST AFRICAN AMERICAN: 41 mL/min — AB (ref >60)
Glucose, Bld: 92 mg/dL (ref 65–99)
Potassium: 3.9 mmol/L (ref 3.5–5.1)
Sodium: 136 mmol/L (ref 135–145)

## 2017-08-25 SURGERY — ECHOCARDIOGRAM, TRANSESOPHAGEAL
Anesthesia: Moderate Sedation

## 2017-08-25 MED ORDER — LIDOCAINE VISCOUS HCL 2 % MT SOLN
OROMUCOSAL | Status: DC | PRN
  Administered 2017-08-25: 20 mL via OROMUCOSAL

## 2017-08-25 MED ORDER — FENTANYL CITRATE (PF) 100 MCG/2ML IJ SOLN
INTRAMUSCULAR | Status: DC | PRN
  Administered 2017-08-25 (×2): 25 ug via INTRAVENOUS

## 2017-08-25 MED ORDER — LIDOCAINE VISCOUS HCL 2 % MT SOLN
OROMUCOSAL | Status: AC
  Filled 2017-08-25: qty 15

## 2017-08-25 MED ORDER — MIDAZOLAM HCL 5 MG/ML IJ SOLN
INTRAMUSCULAR | Status: AC
  Filled 2017-08-25: qty 2

## 2017-08-25 MED ORDER — PHENYLEPHRINE HCL 10 MG/ML IJ SOLN
INTRAMUSCULAR | Status: DC | PRN
  Administered 2017-08-25: 80 ug

## 2017-08-25 MED ORDER — MIDAZOLAM HCL 5 MG/5ML IJ SOLN
INTRAMUSCULAR | Status: DC | PRN
  Administered 2017-08-25: 2 mg via INTRAVENOUS
  Administered 2017-08-25: 1 mg via INTRAVENOUS

## 2017-08-25 MED ORDER — FENTANYL CITRATE (PF) 100 MCG/2ML IJ SOLN
INTRAMUSCULAR | Status: AC
  Filled 2017-08-25: qty 2

## 2017-08-25 NOTE — Progress Notes (Signed)
  Echocardiogram Echocardiogram Transesophageal has been performed.  Ruth Roth 08/25/2017, 3:53 PM

## 2017-08-25 NOTE — CV Procedure (Signed)
    TRANSESOPHAGEAL ECHOCARDIOGRAM   NAME:  Ruth Roth   MRN: [id number] DOB:  17-Oct-1928   ADMIT DATE: 08/21/2017  INDICATIONS: Mitral regurgitation  PROCEDURE:   Informed consent was obtained prior to the procedure. The risks, benefits and alternatives for the procedure were discussed and the patient comprehended these risks.  Risks include, but are not limited to, cough, sore throat, vomiting, nausea, somnolence, esophageal and stomach trauma or perforation, bleeding, low blood pressure, aspiration, pneumonia, infection, trauma to the teeth and death.    After a procedural time-out, the patient was given 3 mg versed and 50 mcg fentanyl for moderate sedation.  The oropharynx was anesthetized 10 cc of topical 1% viscous lidocaine.  The transesophageal probe was inserted in the esophagus and stomach without difficulty and multiple views were obtained.    COMPLICATIONS:    There were no immediate complications.  FINDINGS:  LEFT VENTRICLE: EF = 55-60%. No regional wall motion abnormalities.  RIGHT VENTRICLE: Mildly dilated. Moderately hypokinetic  LEFT ATRIUM: Severely dilated   LEFT ATRIAL APPENDAGE: No thrombus.   RIGHT ATRIUM: Severely dilated.  AORTIC VALVE:  Trileaflet. Mildly calcified. NO AI/AS  MITRAL VALVE:   Severely calcified posterior annulus. Leaflets mildly calcified. Posterior leaflet mildly restricted. Mild MR at worse. Mild MS. Mean gradient 5. MVA by pressure half time 3.4 cm2. MVA by planimetry 1.9cm2. No significant flow reversal in left pulmonary veins.   SBP 90-105 during most of study. At end of study given 23mcg of IV phenylephrine and SBP 130. No change in MR.   TRICUSPID VALVE: Normal. Mild TR  PULMONIC VALVE: Grossly normal. Mild PI  INTERATRIAL SEPTUM: No PFO or ASD.  PERICARDIUM: No effusion  DESCENDING AORTA: Severe plaque.      Benay Spice 3:57 PM

## 2017-08-25 NOTE — H&P (View-Only) (Signed)
Advanced Heart Failure Rounding Note   Subjective:     Started on milrinone 6/14 with IV lasix. Weight down another 5 lbs (Total 19 lbs)  Feeling good this am. Denies SOB, orthopnea or PND. Walking halls without difficult. Denies dizziness or lightheadedness. AF rate remains in 90s. SBP stable.   Creatinine stable in 1.3-1.4 range.. K 3.9.  Objective:   Weight Range:  Vital Signs:   Temp:  [98 F (36.7 C)-98.4 F (36.9 C)] 98 F (36.7 C) (06/17 0357) Pulse Rate:  [74-93] 93 (06/17 0357) Resp:  [22-27] 27 (06/17 0357) BP: (103-116)/(68-77) 116/77 (06/17 0905) SpO2:  [96 %-97 %] 97 % (06/17 0357) Weight:  [126 lb 14.4 oz (57.6 kg)] 126 lb 14.4 oz (57.6 kg) (06/17 0357) Last BM Date: 08/24/17  Weight change: Filed Weights   08/23/17 0633 08/24/17 0552 08/25/17 0357  Weight: 137 lb 11.2 oz (62.5 kg) 131 lb 6.4 oz (59.6 kg) 126 lb 14.4 oz (57.6 kg)    Intake/Output:   Intake/Output Summary (Last 24 hours) at 08/25/2017 0925 Last data filed at 08/25/2017 0600 Gross per 24 hour  Intake 717.6 ml  Output 3650 ml  Net -2932.4 ml    Physical Exam   General: Elderly. NAD.  HEENT: Normal anicteric Neck: Supple. JVP 6-7 cm.. Carotids 2+ bilat; no bruits. No thyromegaly or nodule noted. Cor: PMI nondisplaced. Irregularly irregular. 2/6 AS. MR difficult to appreciate.  Lungs: CTAB, normal effort. No wheeze Abdomen: Soft, non-tender, non-distended, no HSM. No bruits or masses. +BS  Extremities: No cyanosis, clubbing, or rash. 1+ BLE edema.  Neuro: alert & oriented x 3, cranial nerves grossly intact. moves all 4 extremities w/o difficulty. Affect pleasant   Telemetry   AF 80-90s, personally reviewed.   Labs    Basic Metabolic Panel: Recent Labs  Lab 08/21/17 2325 08/22/17 0349 08/23/17 0242 08/24/17 0335 08/25/17 0346  NA 140 136 136 136 136  K 4.3 3.7 3.9 4.2 3.9  CL 106 108 102 96* 94*  CO2 25 22 27  32 32  GLUCOSE 107* 94 98 107* 92  BUN 31* 30* 31* 31*  31*  CREATININE 1.52* 1.40* 1.33* 1.40* 1.32*  CALCIUM 8.5* 7.9* 8.1* 8.3* 8.2*  MG 1.8  --  1.6*  --   --    Liver Function Tests: Recent Labs  Lab 08/21/17 2325  AST 52*  ALT 98*  ALKPHOS 111  BILITOT 0.5  PROT 5.9*  ALBUMIN 3.3*   No results for input(s): LIPASE, AMYLASE in the last 168 hours. No results for input(s): AMMONIA in the last 168 hours.  CBC: Recent Labs  Lab 08/19/17 1410 08/21/17 2325  WBC 7.7 7.0  NEUTROABS  --  3.9  HGB 9.6* 10.0*  HCT 31.5* 34.5*  MCV 85 88.5  PLT 299 259   Cardiac Enzymes: No results for input(s): CKTOTAL, CKMB, CKMBINDEX, TROPONINI in the last 168 hours.  BNP: BNP (last 3 results) Recent Labs    08/21/17 2325  BNP 1,241.1*   ProBNP (last 3 results) No results for input(s): PROBNP in the last 8760 hours.  Other results:  Imaging: No results found.  Medications:    Scheduled Medications: . brimonidine  2 drop Left Eye BID  . dorzolamide  1 drop Left Eye BID  . furosemide  80 mg Intravenous BID  . gabapentin  600 mg Oral QID  . latanoprost  1 drop Both Eyes QHS  . levothyroxine  112 mcg Oral QAC breakfast  . metoprolol  tartrate  100 mg Oral BID  . pantoprazole  40 mg Oral QPM  . potassium chloride  20 mEq Oral BID  . rivaroxaban  15 mg Oral Q supper  . sodium chloride flush  3 mL Intravenous Q12H  . timolol  1 drop Left Eye BID  . ursodiol  300 mg Oral QHS  . ursodiol  600 mg Oral Q24H    Infusions: . sodium chloride    . milrinone 0.25 mcg/kg/min (08/25/17 0600)    PRN Medications: sodium chloride, acetaminophen, fluticasone, ondansetron (ZOFRAN) IV, sodium chloride flush, traMADol  Assessment:   84 year old female with a history of coronary artery disease, chronic diastolic congestive heart failure, persistent atrial fibrillation, hypertension, peptic ulcer disease, fibrocystic breast disease, inflammatory arthritis, autoimmune hepatitis, and neuropathy,   Admitted to [facility] 08/21/17 s/p R/LHC, and  then transferred to [facility] on 6/14 for consideration of MitraClip  Plan/Discussion:    1.Acute on chronic diastolic congestive heart failure with low output:  - Echo 08/01/17 LVEF 50-55%, Mild AS, Mod MR, Mod LAE, Mild RAE, Mod TR, RV moderately HK PA peak pressure 65 mm Hg. - Cath 08/21/17 with signficantly elevated filling pressures, severe MR, and low cardiac output - Had cardiorenal syndrome on admit with severe MR and RV failure - Improving on milrinone. Continue for now.  - Volume status much improved.  - Weight down 19 lbs total.  - Will stop IV lasix after dose this am. To po tomorrow.  - Creatinine stabilized. - Has been on Toprol 100 mg BID for rate control.  Continue for now. Low threshold to decrease  2. Severe mitral regurgitation:  - Echo 08/01/17 LVEF 50-55%, Mild AS, Mod MR, Mod LAE, Mild RAE, Mod TR, PA peak pressure 65 mm Hg. MR ++ calcified, and RV mild/moderately reduced function.  - Plan TEE this afternoon. Will discuss with structural heart team. MV calcification and RV dysfunction may be limiting factors  3. Persistent atrial fibrillation: - Has previously failed amiodarone and DCCV.  - BB as above - On Xarelto  - No change to current plan.    4. Essential hypertension:  - Meds as above.   5. AKI - Due to cardiorenal syndrome.  - Improved with milrinone. Cr stable in 1.3 - 1.4 range today.   TEE today to evaluate candidacy for MitraClip. Pt and family aware she may not be candidate  Length of Stay: 4  Ruth Roth  08/25/2017, 9:25 AM  Advanced Heart Failure Team Pager [phone] (M-F; 7a - 4p)  Please contact [facility] for night-coverage after hours (4p -7a ) and weekends on [email or web address]  Patient seen and examined with the above-signed Advanced Practice Provider and/or Housestaff. I personally reviewed laboratory data, imaging studies and relevant notes. I independently examined the patient and formulated the important aspects of  the plan. I have edited the note to reflect any of my changes or salient points. I have personally discussed the plan with the patient and/or family.  Much improved on milrinone and IV diuresis. Now down 20 pounds. Creatinine stable. Will diurese one more day. I have spoken with structural heart team. Will TEE today and then review for MitraClip candidacy.   Glori Bickers, MD  9:38 AM

## 2017-08-25 NOTE — Interval H&P Note (Signed)
History and Physical Interval Note:  08/25/2017 1:57 PM  Ruth Roth  has presented today for surgery, with the diagnosis of evaluate MV  The various methods of treatment have been discussed with the patient and family. After consideration of risks, benefits and other options for treatment, the patient has consented to  Procedure(s): TRANSESOPHAGEAL ECHOCARDIOGRAM (TEE) (N/A) as a surgical intervention .  The patient's history has been reviewed, patient examined, no change in status, stable for surgery.  I have reviewed the patient's chart and labs.  Questions were answered to the patient's satisfaction.     Bradie Lacock

## 2017-08-25 NOTE — Progress Notes (Addendum)
Advanced Heart Failure Rounding Note   Subjective:     Started on milrinone 6/14 with IV lasix. Weight down another 5 lbs (Total 19 lbs)  Feeling good this am. Denies SOB, orthopnea or PND. Walking halls without difficult. Denies dizziness or lightheadedness. AF rate remains in 90s. SBP stable.   Creatinine stable in 1.3-1.4 range.. K 3.9.  Objective:   Weight Range:  Vital Signs:   Temp:  [98 F (36.7 C)-98.4 F (36.9 C)] 98 F (36.7 C) (06/17 0357) Pulse Rate:  [74-93] 93 (06/17 0357) Resp:  [22-27] 27 (06/17 0357) BP: (103-116)/(68-77) 116/77 (06/17 0905) SpO2:  [96 %-97 %] 97 % (06/17 0357) Weight:  [126 lb 14.4 oz (57.6 kg)] 126 lb 14.4 oz (57.6 kg) (06/17 0357) Last BM Date: 08/24/17  Weight change: Filed Weights   08/23/17 0633 08/24/17 0552 08/25/17 0357  Weight: 137 lb 11.2 oz (62.5 kg) 131 lb 6.4 oz (59.6 kg) 126 lb 14.4 oz (57.6 kg)    Intake/Output:   Intake/Output Summary (Last 24 hours) at 08/25/2017 0925 Last data filed at 08/25/2017 0600 Gross per 24 hour  Intake 717.6 ml  Output 3650 ml  Net -2932.4 ml    Physical Exam   General: Elderly. NAD.  HEENT: Normal anicteric Neck: Supple. JVP 6-7 cm.. Carotids 2+ bilat; no bruits. No thyromegaly or nodule noted. Cor: PMI nondisplaced. Irregularly irregular. 2/6 AS. MR difficult to appreciate.  Lungs: CTAB, normal effort. No wheeze Abdomen: Soft, non-tender, non-distended, no HSM. No bruits or masses. +BS  Extremities: No cyanosis, clubbing, or rash. 1+ BLE edema.  Neuro: alert & oriented x 3, cranial nerves grossly intact. moves all 4 extremities w/o difficulty. Affect pleasant   Telemetry   AF 80-90s, personally reviewed.   Labs    Basic Metabolic Panel: Recent Labs  Lab 08/21/17 2325 08/22/17 0349 08/23/17 0242 08/24/17 0335 08/25/17 0346  NA 140 136 136 136 136  K 4.3 3.7 3.9 4.2 3.9  CL 106 108 102 96* 94*  CO2 25 22 27  32 32  GLUCOSE 107* 94 98 107* 92  BUN 31* 30* 31* 31*  31*  CREATININE 1.52* 1.40* 1.33* 1.40* 1.32*  CALCIUM 8.5* 7.9* 8.1* 8.3* 8.2*  MG 1.8  --  1.6*  --   --    Liver Function Tests: Recent Labs  Lab 08/21/17 2325  AST 52*  ALT 98*  ALKPHOS 111  BILITOT 0.5  PROT 5.9*  ALBUMIN 3.3*   No results for input(s): LIPASE, AMYLASE in the last 168 hours. No results for input(s): AMMONIA in the last 168 hours.  CBC: Recent Labs  Lab 08/19/17 1410 08/21/17 2325  WBC 7.7 7.0  NEUTROABS  --  3.9  HGB 9.6* 10.0*  HCT 31.5* 34.5*  MCV 85 88.5  PLT 299 259   Cardiac Enzymes: No results for input(s): CKTOTAL, CKMB, CKMBINDEX, TROPONINI in the last 168 hours.  BNP: BNP (last 3 results) Recent Labs    08/21/17 2325  BNP 1,241.1*   ProBNP (last 3 results) No results for input(s): PROBNP in the last 8760 hours.  Other results:  Imaging: No results found.  Medications:    Scheduled Medications: . brimonidine  2 drop Left Eye BID  . dorzolamide  1 drop Left Eye BID  . furosemide  80 mg Intravenous BID  . gabapentin  600 mg Oral QID  . latanoprost  1 drop Both Eyes QHS  . levothyroxine  112 mcg Oral QAC breakfast  . metoprolol  tartrate  100 mg Oral BID  . pantoprazole  40 mg Oral QPM  . potassium chloride  20 mEq Oral BID  . rivaroxaban  15 mg Oral Q supper  . sodium chloride flush  3 mL Intravenous Q12H  . timolol  1 drop Left Eye BID  . ursodiol  300 mg Oral QHS  . ursodiol  600 mg Oral Q24H    Infusions: . sodium chloride    . milrinone 0.25 mcg/kg/min (08/25/17 0600)    PRN Medications: sodium chloride, acetaminophen, fluticasone, ondansetron (ZOFRAN) IV, sodium chloride flush, traMADol  Assessment:   84 year old female with a history of coronary artery disease, chronic diastolic congestive heart failure, persistent atrial fibrillation, hypertension, peptic ulcer disease, fibrocystic breast disease, inflammatory arthritis, autoimmune hepatitis, and neuropathy,   Admitted to [facility] 08/21/17 s/p R/LHC, and  then transferred to [facility] on 6/14 for consideration of MitraClip  Plan/Discussion:    1.Acute on chronic diastolic congestive heart failure with low output:  - Echo 08/01/17 LVEF 50-55%, Mild AS, Mod MR, Mod LAE, Mild RAE, Mod TR, RV moderately HK PA peak pressure 65 mm Hg. - Cath 08/21/17 with signficantly elevated filling pressures, severe MR, and low cardiac output - Had cardiorenal syndrome on admit with severe MR and RV failure - Improving on milrinone. Continue for now.  - Volume status much improved.  - Weight down 19 lbs total.  - Will stop IV lasix after dose this am. To po tomorrow.  - Creatinine stabilized. - Has been on Toprol 100 mg BID for rate control.  Continue for now. Low threshold to decrease  2. Severe mitral regurgitation:  - Echo 08/01/17 LVEF 50-55%, Mild AS, Mod MR, Mod LAE, Mild RAE, Mod TR, PA peak pressure 65 mm Hg. MR ++ calcified, and RV mild/moderately reduced function.  - Plan TEE this afternoon. Will discuss with structural heart team. MV calcification and RV dysfunction may be limiting factors  3. Persistent atrial fibrillation: - Has previously failed amiodarone and DCCV.  - BB as above - On Xarelto  - No change to current plan.    4. Essential hypertension:  - Meds as above.   5. AKI - Due to cardiorenal syndrome.  - Improved with milrinone. Cr stable in 1.3 - 1.4 range today.   TEE today to evaluate candidacy for MitraClip. Pt and family aware she may not be candidate  Length of Stay: 4  Annamaria Helling  08/25/2017, 9:25 AM  Advanced Heart Failure Team Pager [phone] (M-F; 7a - 4p)  Please contact [facility] for night-coverage after hours (4p -7a ) and weekends on [email or web address]  Patient seen and examined with the above-signed Advanced Practice Provider and/or Housestaff. I personally reviewed laboratory data, imaging studies and relevant notes. I independently examined the patient and formulated the important aspects of  the plan. I have edited the note to reflect any of my changes or salient points. I have personally discussed the plan with the patient and/or family.  Much improved on milrinone and IV diuresis. Now down 20 pounds. Creatinine stable. Will diurese one more day. I have spoken with structural heart team. Will TEE today and then review for MitraClip candidacy.   Glori Bickers, MD  9:38 AM

## 2017-08-26 LAB — BASIC METABOLIC PANEL
Anion gap: 12 (ref 5–15)
BUN: 32 mg/dL — AB (ref 6–20)
CHLORIDE: 89 mmol/L — AB (ref 101–111)
CO2: 31 mmol/L (ref 22–32)
CREATININE: 1.35 mg/dL — AB (ref 0.44–1.00)
Calcium: 8.1 mg/dL — ABNORMAL LOW (ref 8.9–10.3)
GFR calc Af Amer: 40 mL/min — ABNORMAL LOW (ref >60)
GFR calc non Af Amer: 34 mL/min — ABNORMAL LOW (ref >60)
Glucose, Bld: 92 mg/dL (ref 65–99)
Potassium: 4.2 mmol/L (ref 3.5–5.1)
Sodium: 132 mmol/L — ABNORMAL LOW (ref 135–145)

## 2017-08-26 LAB — MAGNESIUM: Magnesium: 1.7 mg/dL (ref 1.7–2.4)

## 2017-08-26 MED ORDER — TORSEMIDE 20 MG PO TABS
20.0000 mg | ORAL_TABLET | Freq: Two times a day (BID) | ORAL | Status: DC
Start: 2017-08-26 — End: 2017-08-28
  Administered 2017-08-26 – 2017-08-28 (×5): 20 mg via ORAL
  Filled 2017-08-26 (×5): qty 1

## 2017-08-26 MED ORDER — MILRINONE LACTATE IN DEXTROSE 20-5 MG/100ML-% IV SOLN
0.1250 ug/kg/min | INTRAVENOUS | Status: DC
  Administered 2017-08-26 (×2): 0.125 ug/kg/min via INTRAVENOUS
  Filled 2017-08-26: qty 100

## 2017-08-26 MED ORDER — MAGNESIUM SULFATE 2 GM/50ML IV SOLN
2.0000 g | Freq: Once | INTRAVENOUS | Status: AC
  Administered 2017-08-26: 2 g via INTRAVENOUS
  Filled 2017-08-26: qty 50

## 2017-08-26 NOTE — Progress Notes (Signed)
Advanced Heart Failure Rounding Note   Subjective:     Started on milrinone 6/14 with IV lasix. IV lasix stopped last night.  Weight down another 1 lbs (Total 20 lbs)   TEE yesterday with only mild MR. Moderate RV dysfunction  Feels good. No CP, SOB, orthopnea or PND  Creatinine stable in 1.3-1.4 range. K 4.2 AF remains in 90s   Objective:   Weight Range:  Vital Signs:   Temp:  [97.7 F (36.5 C)-98.6 F (37 C)] 98.2 F (36.8 C) (06/18 0404) Pulse Rate:  [44-95] 87 (06/17 2023) Resp:  [12-33] 17 (06/18 0404) BP: (93-143)/(48-124) 114/75 (06/18 0404) SpO2:  [95 %-100 %] 100 % (06/18 0404) Weight:  [125 lb 4.8 oz (56.8 kg)] 125 lb 4.8 oz (56.8 kg) (06/18 0404) Last BM Date: 08/24/17  Weight change: Filed Weights   08/24/17 0552 08/25/17 0357 08/26/17 0404  Weight: 131 lb 6.4 oz (59.6 kg) 126 lb 14.4 oz (57.6 kg) 125 lb 4.8 oz (56.8 kg)    Intake/Output:   Intake/Output Summary (Last 24 hours) at 08/26/2017 0706 Last data filed at 08/26/2017 0408 Gross per 24 hour  Intake 580 ml  Output 3050 ml  Net -2470 ml    Physical Exam   General:  Well appearing. No resp difficulty HEENT: normal Neck: supple. no JVP 6-7. Carotids 2+ bilat; no bruits. No lymphadenopathy or thryomegaly appreciated. Cor: PMI nondisplaced. IRR 2/6 AS Lungs: clear Abdomen: soft, nontender, nondistended. No hepatosplenomegaly. No bruits or masses. Good bowel sounds. Extremities: no cyanosis, clubbing, rash, edema Neuro: alert & orientedx3, cranial nerves grossly intact. moves all 4 extremities w/o difficulty. Affect pleasant   Telemetry   AF 80-90s, personally reviewed.   Labs    Basic Metabolic Panel: Recent Labs  Lab 08/21/17 2325 08/22/17 0349 08/23/17 0242 08/24/17 0335 08/25/17 0346 08/26/17 0302  NA 140 136 136 136 136 132*  K 4.3 3.7 3.9 4.2 3.9 4.2  CL 106 108 102 96* 94* 89*  CO2 25 22 27  32 32 31  GLUCOSE 107* 94 98 107* 92 92  BUN 31* 30* 31* 31* 31* 32*    CREATININE 1.52* 1.40* 1.33* 1.40* 1.32* 1.35*  CALCIUM 8.5* 7.9* 8.1* 8.3* 8.2* 8.1*  MG 1.8  --  1.6*  --   --   --    Liver Function Tests: Recent Labs  Lab 08/21/17 2325  AST 52*  ALT 98*  ALKPHOS 111  BILITOT 0.5  PROT 5.9*  ALBUMIN 3.3*   No results for input(s): LIPASE, AMYLASE in the last 168 hours. No results for input(s): AMMONIA in the last 168 hours.  CBC: Recent Labs  Lab 08/19/17 1410 08/21/17 2325  WBC 7.7 7.0  NEUTROABS  --  3.9  HGB 9.6* 10.0*  HCT 31.5* 34.5*  MCV 85 88.5  PLT 299 259   Cardiac Enzymes: No results for input(s): CKTOTAL, CKMB, CKMBINDEX, TROPONINI in the last 168 hours.  BNP: BNP (last 3 results) Recent Labs    08/21/17 2325  BNP 1,241.1*   ProBNP (last 3 results) No results for input(s): PROBNP in the last 8760 hours.  Other results:  Imaging: No results found.  Medications:    Scheduled Medications: . brimonidine  2 drop Left Eye BID  . dorzolamide  1 drop Left Eye BID  . gabapentin  600 mg Oral QID  . latanoprost  1 drop Both Eyes QHS  . levothyroxine  112 mcg Oral QAC breakfast  . metoprolol tartrate  100 mg Oral BID  . pantoprazole  40 mg Oral QPM  . potassium chloride  20 mEq Oral BID  . rivaroxaban  15 mg Oral Q supper  . sodium chloride flush  3 mL Intravenous Q12H  . timolol  1 drop Left Eye BID  . ursodiol  300 mg Oral QHS  . ursodiol  600 mg Oral Q24H    Infusions: . sodium chloride    . milrinone 0.25 mcg/kg/min (08/25/17 1624)    PRN Medications: sodium chloride, acetaminophen, fluticasone, ondansetron (ZOFRAN) IV, sodium chloride flush, traMADol  Assessment:   84 year old female with a history of coronary artery disease, chronic diastolic congestive heart failure, persistent atrial fibrillation, hypertension, peptic ulcer disease, fibrocystic breast disease, inflammatory arthritis, autoimmune hepatitis, and neuropathy,   Admitted to [facility] 08/21/17 s/p R/LHC, and then transferred to [facility] on  6/14 for consideration of MitraClip  Plan/Discussion:    1.Acute on chronic diastolic congestive heart failure with low output:  - Echo 08/01/17 LVEF 50-55%, Mild AS, Mod MR, Mod LAE, Mild RAE, Mod TR, RV moderately HK PA peak pressure 65 mm Hg. - Cath 08/21/17 with signficantly elevated filling pressures, severe MR, and low cardiac output - Had cardiorenal syndrome on admit with severe MR and RV failure - Improving on milrinone. Will cut back to 0.125 - Volume status much improved.  - Weight down 20 lbs total.  - Will start torsemide 20 bid - Creatinine stabilized. - Has been on Toprol 100 mg BID for rate control.  Continue for now. Low threshold to decrease  2. Severe mitral regurgitation:  - Echo 08/01/17 LVEF 50-55%, Mild AS, Mod MR, Mod LAE, Mild RAE, Mod TR, PA peak pressure 65 mm Hg. MR ++ calcified, and RV mild/moderately reduced function.  - TEE 6/17 showed only mild MR with restricted posterior leaflet. I have reviewed with structural heart team. Feel this is truly functional MR and MR improved with hemodynamic optimization. Suspect we will not be bale to keep her this way off milrinone. Will wean milrinone and try to watch volume status very closely. If getting worse would do another TEE to reassess MR under worse loading conditions.   3. Persistent atrial fibrillation: - Has previously failed amiodarone and DCCV.  - BB as above - On Xarelto  - No change to current plan.    4. Essential hypertension:  - Meds as above.   5. AKI - Due to cardiorenal syndrome.  - Improved with milrinone. Cr stable in 1.3 - 1.4 range today.   Length of Stay: [place], MD  08/26/2017, 7:06 AM  Advanced Heart Failure Team Pager [phone] (M-F; 7a - 4p)  Please contact [facility] for night-coverage after hours (4p -7a ) and weekends on [email or web address]

## 2017-08-26 NOTE — Progress Notes (Signed)
Patient alert and oriented with VS stable. Family updated on current condition and plan of care. Patient ambulated length of hallway and tolerated activity well with some generalized weakness noted. Patient denies any dizziness or SOB. Will continue to monitor.

## 2017-08-27 ENCOUNTER — Encounter (HOSPITAL_COMMUNITY): Payer: Self-pay | Admitting: Internal Medicine

## 2017-08-27 LAB — MAGNESIUM: Magnesium: 2.2 mg/dL (ref 1.7–2.4)

## 2017-08-27 LAB — BASIC METABOLIC PANEL
Anion gap: 12 (ref 5–15)
BUN: 33 mg/dL — AB (ref 6–20)
CALCIUM: 8.3 mg/dL — AB (ref 8.9–10.3)
CHLORIDE: 91 mmol/L — AB (ref 101–111)
CO2: 33 mmol/L — AB (ref 22–32)
CREATININE: 1.44 mg/dL — AB (ref 0.44–1.00)
GFR calc non Af Amer: 32 mL/min — ABNORMAL LOW (ref >60)
GFR, EST AFRICAN AMERICAN: 37 mL/min — AB (ref >60)
GLUCOSE: 88 mg/dL (ref 65–99)
Potassium: 3.9 mmol/L (ref 3.5–5.1)
Sodium: 136 mmol/L (ref 135–145)

## 2017-08-27 NOTE — Care Management Important Message (Signed)
Important Message  Patient Details  Name: Ruth Roth MRN: [id number] Date of Birth: 08-22-1928   Medicare Important Message Given:  Yes    Keller Mikels P Mardene Lessig 08/27/2017, 3:51 PM

## 2017-08-27 NOTE — Progress Notes (Addendum)
Advanced Heart Failure Rounding Note   Subjective:     Started on milrinone 6/14 with IV lasix. IV lasix stopped on 6/17 Weight down another 2 lbs (Total 23 lbs)   6/17 TEE with only mild MR. Moderate RV dysfunction  Yesterday milrinone weaned to 0.125 mcg and torsemide was started.   Denies SOB. Denies chest pain. Feels good. Ambulating hall. No orthopnea or PND.    Objective:   Weight Range:  Vital Signs:   Temp:  [98.1 F (36.7 C)-98.8 F (37.1 C)] 98.8 F (37.1 C) (06/19 0307) Pulse Rate:  [54-85] 83 (06/19 0307) Resp:  [15-22] 20 (06/18 2059) BP: (110-122)/(68-91) 122/78 (06/19 0307) SpO2:  [92 %-98 %] 92 % (06/19 0307) Weight:  [123 lb 3.2 oz (55.9 kg)] 123 lb 3.2 oz (55.9 kg) (06/19 0311) Last BM Date: 08/24/17  Weight change: Filed Weights   08/25/17 0357 08/26/17 0404 08/27/17 0311  Weight: 126 lb 14.4 oz (57.6 kg) 125 lb 4.8 oz (56.8 kg) 123 lb 3.2 oz (55.9 kg)    Intake/Output:   Intake/Output Summary (Last 24 hours) at 08/27/2017 0849 Last data filed at 08/27/2017 0312 Gross per 24 hour  Intake 1397.3 ml  Output 4950 ml  Net -3552.7 ml    Physical Exam   General:   No resp difficulty. In bed HEENT: normal anicteric  Neck: supple. JVP 5-6 . Carotids 2+ bilat; no bruits. No lymphadenopathy or thryomegaly appreciated. Cor: PMI nondisplaced. Irregular  rate & rhythm. No rubs, gallops. Soft SEM LUSB  Lungs: clear no wheeze Abdomen: soft, nontender, nondistended. No hepatosplenomegaly. No bruits or masses. Good bowel sounds. Extremities: no cyanosis, clubbing, rash, edema Neuro: alert & oriented x 3, cranial nerves grossly intact. moves all 4 extremities w/o difficulty. Affect pleasant   Telemetry   A fib 80-90s personally reviewed.   Labs    Basic Metabolic Panel: Recent Labs  Lab 08/21/17 2325  08/23/17 0242 08/24/17 0335 08/25/17 0346 08/26/17 0302 08/27/17 0303  NA 140   < > 136 136 136 132* 136  K 4.3   < > 3.9 4.2 3.9 4.2 3.9    CL 106   < > 102 96* 94* 89* 91*  CO2 25   < > 27 32 32 31 33*  GLUCOSE 107*   < > 98 107* 92 92 88  BUN 31*   < > 31* 31* 31* 32* 33*  CREATININE 1.52*   < > 1.33* 1.40* 1.32* 1.35* 1.44*  CALCIUM 8.5*   < > 8.1* 8.3* 8.2* 8.1* 8.3*  MG 1.8  --  1.6*  --   --  1.7 2.2   < > = values in this interval not displayed.   Liver Function Tests: Recent Labs  Lab 08/21/17 2325  AST 52*  ALT 98*  ALKPHOS 111  BILITOT 0.5  PROT 5.9*  ALBUMIN 3.3*   No results for input(s): LIPASE, AMYLASE in the last 168 hours. No results for input(s): AMMONIA in the last 168 hours.  CBC: Recent Labs  Lab 08/21/17 2325  WBC 7.0  NEUTROABS 3.9  HGB 10.0*  HCT 34.5*  MCV 88.5  PLT 259   Cardiac Enzymes: No results for input(s): CKTOTAL, CKMB, CKMBINDEX, TROPONINI in the last 168 hours.  BNP: BNP (last 3 results) Recent Labs    08/21/17 2325  BNP 1,241.1*   ProBNP (last 3 results) No results for input(s): PROBNP in the last 8760 hours.  Other results:  Imaging: No results found.  Medications:    Scheduled Medications: . brimonidine  2 drop Left Eye BID  . dorzolamide  1 drop Left Eye BID  . gabapentin  600 mg Oral QID  . latanoprost  1 drop Both Eyes QHS  . levothyroxine  112 mcg Oral QAC breakfast  . metoprolol tartrate  100 mg Oral BID  . pantoprazole  40 mg Oral QPM  . potassium chloride  20 mEq Oral BID  . rivaroxaban  15 mg Oral Q supper  . sodium chloride flush  3 mL Intravenous Q12H  . timolol  1 drop Left Eye BID  . torsemide  20 mg Oral BID  . ursodiol  300 mg Oral QHS  . ursodiol  600 mg Oral Q24H    Infusions: . sodium chloride 8 mL/hr at 08/27/17 0001  . milrinone 0.125 mcg/kg/min (08/27/17 0001)    PRN Medications: sodium chloride, acetaminophen, fluticasone, ondansetron (ZOFRAN) IV, sodium chloride flush, traMADol  Assessment:   84 year old female with a history of coronary artery disease, chronic diastolic congestive heart failure, persistent atrial  fibrillation, hypertension, peptic ulcer disease, fibrocystic breast disease, inflammatory arthritis, autoimmune hepatitis, and neuropathy,   Admitted to [facility] 08/21/17 s/p R/LHC, and then transferred to [facility] on 6/14 for consideration of MitraClip  Plan/Discussion:    1.Acute on chronic diastolic congestive heart failure with low output:  - Echo 08/01/17 LVEF 50-55%, Mild AS, Mod MR, Mod LAE, Mild RAE, Mod TR, RV moderately HK PA peak pressure 65 mm Hg. - Cath 08/21/17 with signficantly elevated filling pressures, severe MR, and low cardiac output - Had cardiorenal syndrome on admit with severe MR and RV failure - Stop milrinone and see how she does.  - Volume status much improved.  - Weight down 23 lbs total.  - Continue  torsemide 20 bid - Creatinine stabilized. - Has been on Toprol 100 mg BID for rate control.  Continue for now. Low threshold to decrease  2. Severe mitral regurgitation:  - Echo 08/01/17 LVEF 50-55%, Mild AS, Mod MR, Mod LAE, Mild RAE, Mod TR, PA peak pressure 65 mm Hg. MR ++ calcified, and RV mild/moderately reduced function.  - TEE 6/17 showed only mild MR with restricted posterior leaflet. I have reviewed with structural heart team.  Per Dr Haroldine Laws --->felt  this is truly functional MR and MR improved with hemodynamic optimization. Suspect we will not be able to keep her this way off milrinone.   3. Persistent atrial fibrillation: - Has previously failed amiodarone and DCCV.  - Rate controlled.  - BB as above - On Xarelto     4. Essential hypertension:  - Meds as above.   5. AKI - Due to cardiorenal syndrome.  - Improved with milrinone.  - Creatinine 1.44. Stable.    Stop milrinone. Length of Stay: [place], NP  08/27/2017, 8:49 AM  Advanced Heart Failure Team Pager [phone] (M-F; 7a - 4p)  Please contact [facility] for night-coverage after hours (4p -7a ) and weekends on [email or web address]  Patient seen and examined with Darrick Grinder, NP. We  discussed all aspects of the encounter. I agree with the assessment and plan as stated above.   I have reviewed TEE with Dr. Burt Knack. She has functional MR with restricted posterior MV leaflet. With milrinone support and aggressive diuresis MR now very mild. Suspect main issue is restrictive CM with persistent AF. Will stop milrinone today and follow volume status very closely in HF Clinic. If deteriorates will repeat TEE under worse  loading conditions to re-evaluate MR.   Glori Bickers, MD  11:41 AM

## 2017-08-28 LAB — BASIC METABOLIC PANEL
Anion gap: 12 (ref 5–15)
BUN: 39 mg/dL — AB (ref 6–20)
CHLORIDE: 91 mmol/L — AB (ref 101–111)
CO2: 32 mmol/L (ref 22–32)
Calcium: 8.5 mg/dL — ABNORMAL LOW (ref 8.9–10.3)
Creatinine, Ser: 1.51 mg/dL — ABNORMAL HIGH (ref 0.44–1.00)
GFR calc Af Amer: 35 mL/min — ABNORMAL LOW (ref >60)
GFR calc non Af Amer: 30 mL/min — ABNORMAL LOW (ref >60)
Glucose, Bld: 90 mg/dL (ref 65–99)
POTASSIUM: 3.9 mmol/L (ref 3.5–5.1)
SODIUM: 135 mmol/L (ref 135–145)

## 2017-08-28 MED ORDER — TORSEMIDE 20 MG PO TABS: 20.0000 mg | ORAL_TABLET | Freq: Every day | ORAL | 6 refills | Status: DC

## 2017-08-28 MED ORDER — TORSEMIDE 20 MG PO TABS: 20.0000 mg | ORAL_TABLET | Freq: Every day | ORAL | Status: DC

## 2017-08-28 MED ORDER — POTASSIUM CHLORIDE CRYS ER 20 MEQ PO TBCR: 20.0000 meq | EXTENDED_RELEASE_TABLET | Freq: Every day | ORAL | 6 refills | Status: DC

## 2017-08-28 NOTE — Evaluation (Signed)
Physical Therapy Evaluation Patient Details Name: Ruth Roth MRN: [id number] DOB: 11-04-1928 Today's Date: 08/28/2017   History of Present Illness  Pt is an 84 y/o female admitted secondary to [facility]. Pt also s/p TEE which showed mild mitral regurgitation and moderate RV dysfunction. PMH is CAD, dCHF, a fib, HTN, and NSTEMI.   Clinical Impression  Pt admitted secondary to problem above with deficits below. Pt presenting with weakness and mild unsteadiness. Pt also with episode of dizziness and weakness in the middle of gait and required seated rest. Symptoms resolved with seated rest. Checked BP upon return to room and BP at 99/74 mmHg. Discussed use of rollator to increase safety at home and community during gait. Will continue to follow acutely to maximize functional mobility independence and safety.     Follow Up Recommendations Home health PT;Supervision for mobility/OOB    Equipment Recommendations  Other (comment)(rollator )    Recommendations for Other Services       Precautions / Restrictions Precautions Precautions: None Restrictions Weight Bearing Restrictions: No      Mobility  Bed Mobility Overal bed mobility: Modified Independent                Transfers Overall transfer level: Needs assistance Equipment used: None Transfers: Sit to/from Stand Sit to Stand: Supervision         General transfer comment: Supervision for safety.   Ambulation/Gait Ambulation/Gait assistance: Min guard Gait Distance (Feet): 200 Feet Assistive device: None Gait Pattern/deviations: Step-through pattern;Decreased stride length Gait velocity: Decreased    General Gait Details: Slow, mildly unsteady gait, however, no LOB noted. Required seated rest in the middle of gait training secondary to weakness and dizziness. With seated rest symptoms resolved. Discussed using rollator for ambulation to increase safety with mobility, especially with longer distances. Checked BP upon  return to room and pt's bp at 99/74 mmHg.   Stairs            Wheelchair Mobility    Modified Rankin (Stroke Patients Only)       Balance Overall balance assessment: Needs assistance Sitting-balance support: No upper extremity supported;Feet supported Sitting balance-Leahy Scale: Normal     Standing balance support: No upper extremity supported;During functional activity Standing balance-Leahy Scale: Fair                               Pertinent Vitals/Pain Pain Assessment: No/denies pain    Home Living Family/patient expects to be discharged to:: Private residence Living Arrangements: Alone Available Help at Discharge: Family;Available PRN/intermittently Type of Home: House Home Access: Level entry     Home Layout: One level Home Equipment: Shower seat      Prior Function Level of Independence: Independent         Comments: Was still driving      Hand Dominance   Dominant Hand: Right    Extremity/Trunk Assessment   Upper Extremity Assessment Upper Extremity Assessment: Overall WFL for tasks assessed    Lower Extremity Assessment Lower Extremity Assessment: Generalized weakness    Cervical / Trunk Assessment Cervical / Trunk Assessment: Normal  Communication   Communication: No difficulties  Cognition Arousal/Alertness: Awake/alert Behavior During Therapy: WFL for tasks assessed/performed Overall Cognitive Status: Within Functional Limits for tasks assessed  General Comments General comments (skin integrity, edema, etc.): Pt's son and daughter in law present in room. Educated about using shower seat to increase safety.     Exercises     Assessment/Plan    PT Assessment Patient needs continued PT services  PT Problem List Decreased strength;Cardiopulmonary status limiting activity;Decreased knowledge of use of DME;Decreased balance;Decreased mobility;Decreased activity  tolerance       PT Treatment Interventions DME instruction;Gait training;Functional mobility training;Therapeutic activities;Therapeutic exercise;Balance training;Patient/family education    PT Goals (Current goals can be found in the Care Plan section)  Acute Rehab PT Goals Patient Stated Goal: to be as active as I was before coming into the hospital  PT Goal Formulation: With patient Time For Goal Achievement: 09/11/17 Potential to Achieve Goals: Good    Frequency Min 3X/week   Barriers to discharge        Co-evaluation               AM-PAC PT "6 Clicks" Daily Activity  Outcome Measure Difficulty turning over in bed (including adjusting bedclothes, sheets and blankets)?: None Difficulty moving from lying on back to sitting on the side of the bed? : A Little Difficulty sitting down on and standing up from a chair with arms (e.g., wheelchair, bedside commode, etc,.)?: None Help needed moving to and from a bed to chair (including a wheelchair)?: A Little Help needed walking in hospital room?: A Little Help needed climbing 3-5 steps with a railing? : A Lot 6 Click Score: 19    End of Session Equipment Utilized During Treatment: Gait belt Activity Tolerance: Treatment limited secondary to medical complications (Comment)(dizziness ) Patient left: in bed;with call bell/phone within reach;with family/visitor present Nurse Communication: Mobility status PT Visit Diagnosis: Unsteadiness on feet (R26.81);Muscle weakness (generalized) (M62.81)    Time: 4825-0037 PT Time Calculation (min) (ACUTE ONLY): 17 min   Charges:   PT Evaluation $PT Eval Low Complexity: 1 Low     PT G Codes:        Ruth Roth, PT, DPT  Acute Rehabilitation Services  Pager: [phone]   Ruth Roth 08/28/2017, 12:08 PM

## 2017-08-28 NOTE — Discharge Summary (Addendum)
Advanced Heart Failure Discharge Note  Discharge Summary   [name] MRN: [id number], DOB/AGE: 1928/08/14 84 y.o. Admit date: 08/21/2017 D/C date:     08/28/2017   Primary Discharge Diagnoses:  1.Acute on chronic diastolic congestive heart failure with low output: 2. Severe mitral regurgitation 3. Persistent atrial fibrillation 4. Essential hypertension 5. AKI  Hospital Course:   Ruth Roth is a 84 y.o. female with a history of coronary artery disease, chronic diastolic congestive heart failure, persistent atrial fibrillation, hypertension, peptic ulcer disease, fibrocystic breast disease, inflammatory arthritis, autoimmune hepatitis, and neuropathy.  Echo 08/01/17 LVEF 50-55%, Mild AS, Mod MR, Mod LAE, Mild RAE, Mod TR, PA peak pressure 65 mm Hg.MR ++ calcified, and RV mild/moderately reduced function.  Admitted to [facility] 08/21/17 s/p R/LHC which showed significantly elevated filling pressures, severe MR, and low cardiac output, and then transferred to [facility] 6/14 for consideration of MitraClip.  AKI noted on admission felt to be due to cardiorenal syndrome.   Pt was started on milrinone and IV lasix to decrease filling pressures and to augment diuresis. Overall, she diuresed 25 lbs and was transitioned from IV lasix to torsemide (was on lasix at home).  Case and images reviewed at length with structural heart team. MR felt to be truly functional with improvement from hemodynamic optimization. Not felt to be MitraClip candidate at this time.  Creatinine trended down with optimization, though was back up slightly on day of discharge.   Pt seen am of 08/28/17 and thought stable for discharge with very close follow up as below. Torsemide cut back from BID to once daily, and held for 2 days at discharge.   Discharge Weight Range: 121 lbs Discharge Vitals: Blood pressure 117/80, pulse 88, temperature 97.8 F (36.6 C), temperature source Axillary, resp. rate 18, height 5\' 2"   (1.575 m), weight 121 lb 4.8 oz (55 kg), SpO2 100 %.  Labs: Lab Results  Component Value Date   WBC 7.0 08/21/2017   HGB 10.0 (L) 08/21/2017   HCT 34.5 (L) 08/21/2017   MCV 88.5 08/21/2017   PLT 259 08/21/2017    Recent Labs  Lab 08/21/17 2325  08/28/17 0404  NA 140   < > 135  K 4.3   < > 3.9  CL 106   < > 91*  CO2 25   < > 32  BUN 31*   < > 39*  CREATININE 1.52*   < > 1.51*  CALCIUM 8.5*   < > 8.5*  PROT 5.9*  --   --   BILITOT 0.5  --   --   ALKPHOS 111  --   --   ALT 98*  --   --   AST 52*  --   --   GLUCOSE 107*   < > 90   < > = values in this interval not displayed.   Lab Results  Component Value Date   CHOL 159 05/27/2017   HDL 64.30 05/27/2017   LDLCALC 74 05/27/2017   TRIG 101.0 05/27/2017   BNP (last 3 results) Recent Labs    08/21/17 2325  BNP 1,241.1*    ProBNP (last 3 results) No results for input(s): PROBNP in the last 8760 hours.   Diagnostic Studies/Procedures   No results found.  Discharge Medications   Allergies as of 08/28/2017      Reactions   Statins Other (See Comments)   Affect her liver, Caused liver problems   Haldol [haloperidol Lactate] Rash  Librax [chlordiazepoxide-clidinium] Rash   Plavix [clopidogrel Bisulfate] Rash   Ramipril Rash      Medication List    STOP taking these medications   furosemide 20 MG tablet Commonly known as:  LASIX     TAKE these medications   COMBIGAN 0.2-0.5 % ophthalmic solution Generic drug:  brimonidine-timolol Place 1 drop into the left eye 2 (two) times daily.   dorzolamide 2 % ophthalmic solution Commonly known as:  TRUSOPT Place 1 drop into the left eye 2 (two) times daily.   fluticasone 50 MCG/ACT nasal spray Commonly known as:  FLONASE Place 2 sprays into both nostrils daily as needed for allergies.   gabapentin 300 MG capsule Commonly known as:  NEURONTIN TAKE 2 CAPSULES BY MOUTH FOUR TIMES DAILY   ipratropium 0.06 % nasal spray Commonly known as:  ATROVENT Place 2  sprays into both nostrils daily as needed for rhinitis.   latanoprost 0.005 % ophthalmic solution Commonly known as:  XALATAN Place 1 drop into both eyes at bedtime.   levothyroxine 112 MCG tablet Commonly known as:  SYNTHROID, LEVOTHROID TAKE 1 TABLET BY MOUTH EVERY DAY BEFORE BREAKFAST   metoprolol tartrate 100 MG tablet Commonly known as:  LOPRESSOR Take 1 tablet (100 mg total) by mouth 2 (two) times daily.   pantoprazole 40 MG tablet Commonly known as:  PROTONIX Take 1 tablet (40 mg total) by mouth daily. What changed:  when to take this   potassium chloride SA 20 MEQ tablet Commonly known as:  K-DUR,KLOR-CON Take 1 tablet (20 mEq total) by mouth daily. Start taking on:  08/31/2017   Rivaroxaban 15 MG Tabs tablet Commonly known as:  XARELTO Take 1 tablet (15 mg total) by mouth daily with supper.   torsemide 20 MG tablet Commonly known as:  DEMADEX Take 1 tablet (20 mg total) by mouth daily. Can take extra 20 mg for weight gain of 3 lbs overnight or 5 lbs within one week. Start taking on:  08/31/2017   traMADol 50 MG tablet Commonly known as:  ULTRAM Take 1 tablet (50 mg total) by mouth 3 (three) times daily as needed (for pain.). What changed:    when to take this  reasons to take this   ursodiol 300 MG capsule Commonly known as:  ACTIGALL Take 300-600 mg by mouth See admin instructions. Take 2 capsules (600 mg) daily after lunch & 1 capsule (300 mg) at night.            Durable Medical Equipment  (From admission, onward)        Start     Ordered   08/28/17 1337  Heart failure home health orders  (Heart failure home health orders / Face to face)  Once    Comments:  Heart Failure Follow-up Care:  Verify follow-up appointments per Patient Discharge Instructions. Confirm transportation arranged. Reconcile home medications with discharge medication list. Remove discontinued medications from use. Assist patient/caregiver to manage medications using pill  box. Reinforce low sodium food selection Assessments: Vital signs and oxygen saturation at each visit. Assess home environment for safety concerns, caregiver support and availability of low-sodium foods. Consult Education officer, museum, PT/OT, Dietitian, and CNA based on assessments. Perform comprehensive cardiopulmonary assessment. Notify MD for any change in condition or weight gain of 3 pounds in one day or 5 pounds in one week with symptoms. Daily Weights and Symptom Monitoring: Ensure patient has access to scales. Teach patient/caregiver to weigh daily before breakfast and after voiding using same scale and  record.    Teach patient/caregiver to track weight and symptoms and when to notify Provider. Activity: Develop individualized activity plan with patient/caregiver.   Question Answer Comment  Heart Failure Follow-up Care Advanced Heart Failure (AHF) Clinic at [phone]   Lab frequency Other see comments   Fax lab results to AHF Clinic at [phone]   Diet Low Sodium Heart Healthy   Fluid restrictions: 2000 mL Fluid   Initiate Heart Failure Clinic Diuretic Protocol to be used by [place] only ( to be ordered by Heart Failure Team Providers Only) Yes      08/28/17 1337      Disposition   The patient will be discharged in stable but tenuous condition to home.   Discharge Instructions    (HEART FAILURE PATIENTS) Call MD:  Anytime you have any of the following symptoms: 1) 3 pound weight gain in 24 hours or 5 pounds in 1 week 2) shortness of breath, with or without a dry hacking cough 3) swelling in the hands, feet or stomach 4) if you have to sleep on extra pillows at night in order to breathe.   Complete by:  As directed    Diet - low sodium heart healthy   Complete by:  As directed    Increase activity slowly   Complete by:  As directed    STOP any activity that causes chest pain, shortness of breath, dizziness, sweating, or exessive weakness   Complete by:  As  directed      Follow-up Information    Bensimhon, Shaune Pascal, MD Follow up on 09/05/2017.   Specialty:  Cardiology Why:  at 0940 for post hospital follow up. Code for parking is 1300. Enter thru consturction off of [address]. Underground parking on your right. Can also park in [facility] ED lot and enter thru blue awning.  Contact information: [address] [place] [address] [phone]            Duration of Discharge Encounter: Greater than 35 minutes   Signed, Annamaria Helling 08/28/2017, 2:34 PM   Patient seen and examined with the above-signed Advanced Practice Provider and/or Housestaff. I personally reviewed laboratory data, imaging studies and relevant notes. I independently examined the patient and formulated the important aspects of the plan. I have edited the note to reflect any of my changes or salient points. I have personally discussed the plan with the patient and/or family.  Overall much improved since admit. Weight down 20 pounds. Now off mirlinone. Creatinine getting slightly worse. Will let her go home today and will need close f/u in HF Clinic. If fails close diuretic management will need to consider home milrinone versus MitrClip if she qualifies.   Glori Bickers, MD  4:55 PM

## 2017-08-28 NOTE — Care Management Note (Signed)
Case Management Note  Patient Details  Name: SHAWNTRICE SALLE MRN: [id number] Date of Birth: 02/17/1929  Subjective/Objective: Pt presented for CHF- PTA home alone with family and neighbor support. Pt plan for transition home 08-28-17 with [facility].                  Action/Plan: CM did offer choice to patient in regards to H/H Services. Agency list provided and pt chose [facility] for Services. Referral made to [facility] with [facility]- SOC to begin within 24-48 hours post transition home. DME Rolling Walker with Seat to be delivered to room prior to transition home. No further needs from CM at this time.   Expected Discharge Date:  08/28/17               Expected Discharge Plan:  [place]  In-House Referral:  NA  Discharge planning Services  CM Consult  Post Acute Care Choice:  Home Health, Durable Medical Equipment Choice offered to:  Patient  DME Arranged:  Walker rolling with seat DME Agency:  [place] Arranged:  RN, Disease Management, PT [place] Agency:  [place]  Status of Service:  Completed, signed off  If discussed at [place] of Stay Meetings, dates discussed:    Additional Comments:  Bethena Roys, RN 08/28/2017, 3:23 PM

## 2017-08-28 NOTE — Progress Notes (Signed)
IV and telemetry discontinued at this time. CCMD notified. Discharge instructions reviewed with patient and family. All questions answered.   Emelda Fear, RN

## 2017-08-28 NOTE — Progress Notes (Addendum)
Advanced Heart Failure Rounding Note   Subjective:     Started on milrinone 6/14 with IV lasix. IV lasix stopped on 6/17 Weight down another 2 lbs (Total 23 lbs)   6/17 TEE with only mild MR. Moderate RV dysfunction  Milrinone stopped yesterday.   Denies SOB walking halls, just feels week. Was able to walk from end of hall to Nursing station and back without difficult. Just fatigued afterward.   Creatinine trending up slightly 1.35 -> 1.44 -> 1.51   Objective:   Weight Range:  Vital Signs:   Temp:  [97.3 F (36.3 C)-98.6 F (37 C)] 97.5 F (36.4 C) (06/20 0837) Pulse Rate:  [75-90] 75 (06/20 0500) Resp:  [13-20] 13 (06/20 0837) BP: (103-117)/(55-84) 114/84 (06/20 0837) SpO2:  [99 %-100 %] 99 % (06/20 0837) Weight:  [121 lb 4.8 oz (55 kg)] 121 lb 4.8 oz (55 kg) (06/20 0456) Last BM Date: 08/24/17  Weight change: Filed Weights   08/26/17 0404 08/27/17 0311 08/28/17 0456  Weight: 125 lb 4.8 oz (56.8 kg) 123 lb 3.2 oz (55.9 kg) 121 lb 4.8 oz (55 kg)    Intake/Output:   Intake/Output Summary (Last 24 hours) at 08/28/2017 1041 Last data filed at 08/28/2017 0900 Gross per 24 hour  Intake 240 ml  Output 650 ml  Net -410 ml    Physical Exam   General: Elderly appearing. NAD.  HEENT: Normal Neck: Supple. JVP not elevated. Carotids 2+ bilat; no bruits. No thyromegaly or nodule noted. Cor: PMI nondisplaced. Irregular. Soft SEM LUSB.  Lungs: CTAB, normal effort. Abdomen: Soft, non-tender, non-distended, no HSM. No bruits or masses. +BS  Extremities: No cyanosis, clubbing, or rash. R and LLE no edema.  Neuro: Alert & orientedx3, cranial nerves grossly intact. moves all 4 extremities w/o difficulty. Affect pleasant   Telemetry   Afib 70-80s, personally reviewed.   Labs    Basic Metabolic Panel: Recent Labs  Lab 08/21/17 2325  08/23/17 0242 08/24/17 0335 08/25/17 0346 08/26/17 0302 08/27/17 0303 08/28/17 0404  NA 140   < > 136 136 136 132* 136 135  K  4.3   < > 3.9 4.2 3.9 4.2 3.9 3.9  CL 106   < > 102 96* 94* 89* 91* 91*  CO2 25   < > 27 32 32 31 33* 32  GLUCOSE 107*   < > 98 107* 92 92 88 90  BUN 31*   < > 31* 31* 31* 32* 33* 39*  CREATININE 1.52*   < > 1.33* 1.40* 1.32* 1.35* 1.44* 1.51*  CALCIUM 8.5*   < > 8.1* 8.3* 8.2* 8.1* 8.3* 8.5*  MG 1.8  --  1.6*  --   --  1.7 2.2  --    < > = values in this interval not displayed.   Liver Function Tests: Recent Labs  Lab 08/21/17 2325  AST 52*  ALT 98*  ALKPHOS 111  BILITOT 0.5  PROT 5.9*  ALBUMIN 3.3*   No results for input(s): LIPASE, AMYLASE in the last 168 hours. No results for input(s): AMMONIA in the last 168 hours.  CBC: Recent Labs  Lab 08/21/17 2325  WBC 7.0  NEUTROABS 3.9  HGB 10.0*  HCT 34.5*  MCV 88.5  PLT 259   Cardiac Enzymes: No results for input(s): CKTOTAL, CKMB, CKMBINDEX, TROPONINI in the last 168 hours.  BNP: BNP (last 3 results) Recent Labs    08/21/17 2325  BNP 1,241.1*   ProBNP (last 3 results) No  results for input(s): PROBNP in the last 8760 hours.  Other results:  Imaging: No results found.  Medications:    Scheduled Medications: . brimonidine  2 drop Left Eye BID  . dorzolamide  1 drop Left Eye BID  . gabapentin  600 mg Oral QID  . latanoprost  1 drop Both Eyes QHS  . levothyroxine  112 mcg Oral QAC breakfast  . metoprolol tartrate  100 mg Oral BID  . pantoprazole  40 mg Oral QPM  . potassium chloride  20 mEq Oral BID  . rivaroxaban  15 mg Oral Q supper  . sodium chloride flush  3 mL Intravenous Q12H  . timolol  1 drop Left Eye BID  . torsemide  20 mg Oral BID  . ursodiol  300 mg Oral QHS  . ursodiol  600 mg Oral Q24H    Infusions: . sodium chloride Stopped (08/27/17 0900)    PRN Medications: sodium chloride, acetaminophen, fluticasone, ondansetron (ZOFRAN) IV, sodium chloride flush, traMADol  Assessment:   84 year old female with a history of coronary artery disease, chronic diastolic congestive heart failure,  persistent atrial fibrillation, hypertension, peptic ulcer disease, fibrocystic breast disease, inflammatory arthritis, autoimmune hepatitis, and neuropathy,   Admitted to [facility] 08/21/17 s/p R/LHC, and then transferred to [facility] on 6/14 for consideration of MitraClip  Plan/Discussion:    1.Acute on chronic diastolic congestive heart failure with low output:  - Echo 08/01/17 LVEF 50-55%, Mild AS, Mod MR, Mod LAE, Mild RAE, Mod TR, RV moderately HK PA peak pressure 65 mm Hg. - Cath 08/21/17 with signficantly elevated filling pressures, severe MR, and low cardiac output - Had cardiorenal syndrome on admit with severe MR and RV failure - Relatively stable off milrinone. Creatinine up slightly this am.  - Volume status stable.   - Weight down 25 lbs total.  - Decrease torsemide to 20 mg daily. May need to cut back even further.  - Creatinine mildly up.  - Has been on Toprol 100 mg BID for rate control.  Continue for now. Low threshold to decrease  2. Severe mitral regurgitation:  - Echo 08/01/17 LVEF 50-55%, Mild AS, Mod MR, Mod LAE, Mild RAE, Mod TR, PA peak pressure 65 mm Hg. MR ++ calcified, and RV mild/moderately reduced function.  - TEE 6/17 showed only mild MR with restricted posterior leaflet. I have reviewed with structural heart team.  - Per Dr Haroldine Laws --->felt  this is truly functional MR and MR improved with hemodynamic optimization. Suspect we will not be able to keep her this way off milrinone. Will follow closely.   3. Persistent atrial fibrillation: - Has previously failed amiodarone and DCCV.  - Rate controlled.   - BB as above - On Xarelto    4. Essential hypertension:  - Meds as above.    5. AKI - Due to cardiorenal syndrome.  - Improved with milrinone.  - Creatinine 1.51. Will follow closely.     Stable on torsemide and off milrinone. Decrease torsemide as above. Will need very close follow up. Scheduled for next Friday in HF clinic.   Length of Stay:  [employer], [place]  08/28/2017, 10:41 AM  Advanced Heart Failure Team Pager [phone] (M-F; 7a - 4p)  Please contact [facility] for night-coverage after hours (4p -7a ) and weekends on [email or web address]  Patient seen and examined with the above-signed Advanced Practice Provider and/or Housestaff. I personally reviewed laboratory data, imaging studies and relevant notes. I independently examined the patient and  formulated the important aspects of the plan. I have edited the note to reflect any of my changes or salient points. I have personally discussed the plan with the patient and/or family.  Overall much improved since admit. Weight down 20 pounds. Now off mirlinone. Creatinine getting slightly worse. Will let her go home today and will need close f/u in HF Clinic. If fails close diuretic management will need to consider home milrinone versus MitrClip if she qualifies.   Glori Bickers, MD  4:55 PM

## 2017-08-29 DIAGNOSIS — I272 Pulmonary hypertension, unspecified: Secondary | ICD-10-CM | POA: Diagnosis not present

## 2017-08-29 DIAGNOSIS — K754 Autoimmune hepatitis: Secondary | ICD-10-CM | POA: Diagnosis not present

## 2017-08-29 DIAGNOSIS — G9009 Other idiopathic peripheral autonomic neuropathy: Secondary | ICD-10-CM | POA: Diagnosis not present

## 2017-08-29 DIAGNOSIS — I2510 Atherosclerotic heart disease of native coronary artery without angina pectoris: Secondary | ICD-10-CM | POA: Diagnosis not present

## 2017-08-29 DIAGNOSIS — I481 Persistent atrial fibrillation: Secondary | ICD-10-CM | POA: Diagnosis not present

## 2017-08-29 DIAGNOSIS — I5033 Acute on chronic diastolic (congestive) heart failure: Secondary | ICD-10-CM | POA: Diagnosis not present

## 2017-08-29 DIAGNOSIS — N6019 Diffuse cystic mastopathy of unspecified breast: Secondary | ICD-10-CM | POA: Diagnosis not present

## 2017-08-29 DIAGNOSIS — I340 Nonrheumatic mitral (valve) insufficiency: Secondary | ICD-10-CM | POA: Diagnosis not present

## 2017-08-29 DIAGNOSIS — M064 Inflammatory polyarthropathy: Secondary | ICD-10-CM | POA: Diagnosis not present

## 2017-08-29 DIAGNOSIS — I110 Hypertensive heart disease with heart failure: Secondary | ICD-10-CM | POA: Diagnosis not present

## 2017-09-02 DIAGNOSIS — I5033 Acute on chronic diastolic (congestive) heart failure: Secondary | ICD-10-CM | POA: Diagnosis not present

## 2017-09-02 DIAGNOSIS — I340 Nonrheumatic mitral (valve) insufficiency: Secondary | ICD-10-CM | POA: Diagnosis not present

## 2017-09-02 DIAGNOSIS — I481 Persistent atrial fibrillation: Secondary | ICD-10-CM | POA: Diagnosis not present

## 2017-09-02 DIAGNOSIS — I272 Pulmonary hypertension, unspecified: Secondary | ICD-10-CM | POA: Diagnosis not present

## 2017-09-02 DIAGNOSIS — I110 Hypertensive heart disease with heart failure: Secondary | ICD-10-CM | POA: Diagnosis not present

## 2017-09-02 DIAGNOSIS — I2510 Atherosclerotic heart disease of native coronary artery without angina pectoris: Secondary | ICD-10-CM | POA: Diagnosis not present

## 2017-09-03 DIAGNOSIS — I272 Pulmonary hypertension, unspecified: Secondary | ICD-10-CM | POA: Diagnosis not present

## 2017-09-03 DIAGNOSIS — I481 Persistent atrial fibrillation: Secondary | ICD-10-CM | POA: Diagnosis not present

## 2017-09-03 DIAGNOSIS — I110 Hypertensive heart disease with heart failure: Secondary | ICD-10-CM | POA: Diagnosis not present

## 2017-09-03 DIAGNOSIS — I5033 Acute on chronic diastolic (congestive) heart failure: Secondary | ICD-10-CM | POA: Diagnosis not present

## 2017-09-03 DIAGNOSIS — I2510 Atherosclerotic heart disease of native coronary artery without angina pectoris: Secondary | ICD-10-CM | POA: Diagnosis not present

## 2017-09-03 DIAGNOSIS — I340 Nonrheumatic mitral (valve) insufficiency: Secondary | ICD-10-CM | POA: Diagnosis not present

## 2017-09-04 DIAGNOSIS — I110 Hypertensive heart disease with heart failure: Secondary | ICD-10-CM | POA: Diagnosis not present

## 2017-09-04 DIAGNOSIS — I5033 Acute on chronic diastolic (congestive) heart failure: Secondary | ICD-10-CM | POA: Diagnosis not present

## 2017-09-04 DIAGNOSIS — I481 Persistent atrial fibrillation: Secondary | ICD-10-CM | POA: Diagnosis not present

## 2017-09-04 DIAGNOSIS — I272 Pulmonary hypertension, unspecified: Secondary | ICD-10-CM | POA: Diagnosis not present

## 2017-09-04 DIAGNOSIS — I340 Nonrheumatic mitral (valve) insufficiency: Secondary | ICD-10-CM | POA: Diagnosis not present

## 2017-09-04 DIAGNOSIS — I2510 Atherosclerotic heart disease of native coronary artery without angina pectoris: Secondary | ICD-10-CM | POA: Diagnosis not present

## 2017-09-05 ENCOUNTER — Other Ambulatory Visit: Payer: Self-pay

## 2017-09-05 ENCOUNTER — Ambulatory Visit (HOSPITAL_COMMUNITY)
Admission: RE | Admit: 2017-09-05 | Discharge: 2017-09-05 | Disposition: A | Discharge status: Home / Self Care | Payer: Medicare Other | Source: Ambulatory Visit | Attending: Internal Medicine | Admitting: Internal Medicine

## 2017-09-05 VITALS — BP 127/67 | HR 87 | Wt 125.8 lb

## 2017-09-05 DIAGNOSIS — I482 Chronic atrial fibrillation: Secondary | ICD-10-CM | POA: Diagnosis not present

## 2017-09-05 DIAGNOSIS — Z823 Family history of stroke: Secondary | ICD-10-CM | POA: Insufficient documentation

## 2017-09-05 DIAGNOSIS — Z7901 Long term (current) use of anticoagulants: Secondary | ICD-10-CM | POA: Insufficient documentation

## 2017-09-05 DIAGNOSIS — Z79890 Hormone replacement therapy (postmenopausal): Secondary | ICD-10-CM | POA: Insufficient documentation

## 2017-09-05 DIAGNOSIS — Z8041 Family history of malignant neoplasm of ovary: Secondary | ICD-10-CM | POA: Insufficient documentation

## 2017-09-05 DIAGNOSIS — Z09 Encounter for follow-up examination after completed treatment for conditions other than malignant neoplasm: Secondary | ICD-10-CM | POA: Insufficient documentation

## 2017-09-05 DIAGNOSIS — I252 Old myocardial infarction: Secondary | ICD-10-CM | POA: Insufficient documentation

## 2017-09-05 DIAGNOSIS — Z8711 Personal history of peptic ulcer disease: Secondary | ICD-10-CM | POA: Insufficient documentation

## 2017-09-05 DIAGNOSIS — Z800 Family history of malignant neoplasm of digestive organs: Secondary | ICD-10-CM | POA: Insufficient documentation

## 2017-09-05 DIAGNOSIS — I481 Persistent atrial fibrillation: Secondary | ICD-10-CM | POA: Diagnosis not present

## 2017-09-05 DIAGNOSIS — E039 Hypothyroidism, unspecified: Secondary | ICD-10-CM | POA: Diagnosis not present

## 2017-09-05 DIAGNOSIS — N179 Acute kidney failure, unspecified: Secondary | ICD-10-CM | POA: Insufficient documentation

## 2017-09-05 DIAGNOSIS — I340 Nonrheumatic mitral (valve) insufficiency: Secondary | ICD-10-CM | POA: Diagnosis not present

## 2017-09-05 DIAGNOSIS — Z888 Allergy status to other drugs, medicaments and biological substances status: Secondary | ICD-10-CM | POA: Insufficient documentation

## 2017-09-05 DIAGNOSIS — Z79891 Long term (current) use of opiate analgesic: Secondary | ICD-10-CM | POA: Insufficient documentation

## 2017-09-05 DIAGNOSIS — K754 Autoimmune hepatitis: Secondary | ICD-10-CM | POA: Diagnosis not present

## 2017-09-05 DIAGNOSIS — Z801 Family history of malignant neoplasm of trachea, bronchus and lung: Secondary | ICD-10-CM | POA: Insufficient documentation

## 2017-09-05 DIAGNOSIS — I272 Pulmonary hypertension, unspecified: Secondary | ICD-10-CM

## 2017-09-05 DIAGNOSIS — Z79899 Other long term (current) drug therapy: Secondary | ICD-10-CM | POA: Diagnosis not present

## 2017-09-05 DIAGNOSIS — I5032 Chronic diastolic (congestive) heart failure: Secondary | ICD-10-CM | POA: Diagnosis not present

## 2017-09-05 DIAGNOSIS — I110 Hypertensive heart disease with heart failure: Secondary | ICD-10-CM | POA: Insufficient documentation

## 2017-09-05 DIAGNOSIS — Z803 Family history of malignant neoplasm of breast: Secondary | ICD-10-CM | POA: Insufficient documentation

## 2017-09-05 DIAGNOSIS — R9431 Abnormal electrocardiogram [ECG] [EKG]: Secondary | ICD-10-CM | POA: Diagnosis not present

## 2017-09-05 DIAGNOSIS — H409 Unspecified glaucoma: Secondary | ICD-10-CM | POA: Diagnosis not present

## 2017-09-05 DIAGNOSIS — I4819 Other persistent atrial fibrillation: Secondary | ICD-10-CM

## 2017-09-05 DIAGNOSIS — M064 Inflammatory polyarthropathy: Secondary | ICD-10-CM | POA: Diagnosis not present

## 2017-09-05 DIAGNOSIS — I2510 Atherosclerotic heart disease of native coronary artery without angina pectoris: Secondary | ICD-10-CM | POA: Diagnosis not present

## 2017-09-05 DIAGNOSIS — E7800 Pure hypercholesterolemia, unspecified: Secondary | ICD-10-CM | POA: Insufficient documentation

## 2017-09-05 DIAGNOSIS — N6019 Diffuse cystic mastopathy of unspecified breast: Secondary | ICD-10-CM | POA: Insufficient documentation

## 2017-09-05 LAB — BASIC METABOLIC PANEL
ANION GAP: 11 (ref 5–15)
BUN: 55 mg/dL — ABNORMAL HIGH (ref 8–23)
CALCIUM: 8.9 mg/dL (ref 8.9–10.3)
CO2: 29 mmol/L (ref 22–32)
Chloride: 99 mmol/L (ref 98–111)
Creatinine, Ser: 1.78 mg/dL — ABNORMAL HIGH (ref 0.44–1.00)
GFR calc Af Amer: 28 mL/min — ABNORMAL LOW (ref >60)
GFR calc non Af Amer: 24 mL/min — ABNORMAL LOW (ref >60)
GLUCOSE: 85 mg/dL (ref 70–99)
Potassium: 4.4 mmol/L (ref 3.5–5.1)
Sodium: 139 mmol/L (ref 135–145)

## 2017-09-05 LAB — CBC
HCT: 34.1 % — ABNORMAL LOW (ref 36.0–46.0)
HEMOGLOBIN: 10 g/dL — AB (ref 12.0–15.0)
MCH: 24.8 pg — ABNORMAL LOW (ref 26.0–34.0)
MCHC: 29.3 g/dL — AB (ref 30.0–36.0)
MCV: 84.4 fL (ref 78.0–100.0)
PLATELETS: 323 10*3/uL (ref 150–400)
RBC: 4.04 MIL/uL (ref 3.87–5.11)
RDW: 16 % — AB (ref 11.5–15.5)
WBC: 8.9 10*3/uL (ref 4.0–10.5)

## 2017-09-05 NOTE — Progress Notes (Signed)
Advanced Heart Failure Clinic Note   PCP: Einar Pheasant, MD PCP-Cardiologist: [name], MD   HPI: Ruth Roth is a 84 y.o. female with a history of 84 year old female with a history of coronary artery disease, chronic diastolic congestive heart failure, persistent atrial fibrillation, hypertension, peptic ulcer disease, fibrocystic breast disease, inflammatory arthritis, autoimmune hepatitis, and neuropathy.  Admitted to [facility] 08/21/17 s/p R/LHC which showed significantly elevated filling pressures, severe MR, and low cardiac output, and then transferred to [facility] 6/14 for consideration of MitraClip.  AKI noted on admission felt to be due to cardiorenal syndrome. She required milrinone and lasix for diuresis. Diuresed 25 lbs and transitioned to 20 mg torsemide daily. Case and images reviewed at length with structural heart team. MR felt to be truly functional with improvement from hemodynamic optimization. Not felt to be MitraClip candidate at this time. Discharge weight: 121 lbs.   She presents today for post hospital follow up. Overall doing ok. Denies SOB with walking about 1 block with incline. But does get fatigued even with daily activities. Says it is worse compared to when she was in NSR. Denies orthopnea, PND, or edema. She has palpitations with walking. Denies CP, fever, chills, or cough. She gets dizzy at least daily with standing, but not every time she stands. Appetite is good. She had a nosebleed that lasted a couple minutes, but no other bleeding on Eliquis. Going to start [facility] PT with [facility]. Taking all medications. Weights: 120-121 lbs since discharge. Limits fluid and salt.   Cardiac Studies:  [facility] 08/21/2017 - Mild non-obstructive CAD RHC Procedural Findings: (Obtained from Scan 08/21/17 at 1030) Hemodynamics (mmHg) RA mean 17 RV 68/11 (19) PA 62/27 (44) PCWP 41  AO 147/67 (102) Cardiac Output (Fick) 2.45 Cardiac Index  (Fick) 1.46 PVR 1.23 WU  Echo 08/01/17 LVEF 50-55%,  Mild AS, Mod MR, Mod LAE, Mild RAE, Mod TR, PA peak pressure 65 mm Hg.MR ++ calcified, and RV mild/moderately reduced function.   Review of Systems: [y] = yes, [ ]  = no   General: Weight gain [ ] ; Weight loss [ ] ; Anorexia [ ] ; Fatigue [ y]; Fever [ ] ; Chills [ ] ; Weakness [ ]   Cardiac: Chest pain/pressure [ ] ; Resting SOB [ ] ; Exertional SOB [ ] ; Orthopnea [ ] ; Pedal Edema [ ] ; Palpitations [ ] ; Syncope [ ] ; Presyncope [ ] ; Paroxysmal nocturnal dyspnea[ ]   Pulmonary: Cough [ ] ; Wheezing[ ] ; Hemoptysis[ ] ; Sputum [ ] ; Snoring [ ]   GI: Vomiting[ ] ; Dysphagia[ ] ; Melena[ ] ; Hematochezia [ ] ; Heartburn[ ] ; Abdominal pain [ ] ; Constipation [ ] ; Diarrhea [ ] ; BRBPR [ ]   GU: Hematuria[ ] ; Dysuria [ ] ; Nocturia[ ]   Vascular: Pain in legs with walking [ ] ; Pain in feet with lying flat [ ] ; Non-healing sores [ ] ; Stroke [ ] ; TIA [ ] ; Slurred speech [ ] ;  Neuro: Headaches[ ] ; Vertigo[ ] ; Seizures[ ] ; Paresthesias[ ] ;Blurred vision [ ] ; Diplopia [ ] ; Vision changes [ ]   Ortho/Skin: Arthritis [ y]; Joint pain [ y]; Muscle pain [ ] ; Joint swelling [ ] ; Back Pain [ ] ; Rash [ ]   Psych: Depression[ ] ; Anxiety[ ]   Heme: Bleeding problems [ ] ; Clotting disorders [ ] ; Anemia [ ]   Endocrine: Diabetes [ ] ; Thyroid dysfunction[ ]    Past Medical History:  Diagnosis Date  . (HFpEF) heart failure with preserved ejection fraction ([place])    a. 07/2017 Echo: EF 50-55%, no rwma, mild AS/AI, mod MR, mod dil LA, mod TR, PASP  70mmHg.  . Autoimmune hepatitis ([place])    followed by Dr [name]  . Fibrocystic breast disease   . Glaucoma   . Hypercholesterolemia   . Hypertension   . Hypothyroidism   . Inflammatory arthritis   . MI (myocardial infarction) ([place])   . Neuropathy   . Non-obstructive Coronary artery disease    a. 05/2014 NSTEMI/Cath: LM nl, LAD mild dzs, LCX 60ost hazy (? culprit), RCA mild dzs. EF 60% by echo-->Med Rx; b. 08/2017 Cath: LM 30ost, LAD min irregs, LCX small, 40ost/p, RCA large, min irregs,  RPDA/RPL1 nl, EF 50-55%. 4+MR.  . Osteoarthritis   . Permanent atrial fibrillation (HCC)    a. CHA2DS2VASc = 6-->Xarelto.  . 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 Severe mitral regurgitation    a. 07/2017 Echo: Mod MR; b. 08/2017 Cath: 4+/Severe MR.    Current Outpatient Medications  Medication Sig Dispense Refill  . brimonidine-timolol (COMBIGAN) 0.2-0.5 % ophthalmic solution Place 1 drop into the left eye 2 (two) times daily.     . dorzolamide (TRUSOPT) 2 % ophthalmic solution Place 1 drop into the left eye 2 (two) times daily.    . fluticasone (FLONASE) 50 MCG/ACT nasal spray Place 2 sprays into both nostrils daily as needed for allergies.     Marland Kitchen gabapentin (NEURONTIN) 300 MG capsule TAKE 2 CAPSULES BY MOUTH FOUR TIMES DAILY 720 capsule 0  . ipratropium (ATROVENT) 0.06 % nasal spray Place 2 sprays into both nostrils daily as needed for rhinitis.     Marland Kitchen latanoprost (XALATAN) 0.005 % ophthalmic solution Place 1 drop into both eyes at bedtime.     Marland Kitchen levothyroxine (SYNTHROID, LEVOTHROID) 112 MCG tablet TAKE 1 TABLET BY MOUTH EVERY DAY BEFORE BREAKFAST 90 tablet 0  . metoprolol tartrate (LOPRESSOR) 100 MG tablet Take 1 tablet (100 mg total) by mouth 2 (two) times daily. 180 tablet 3  . pantoprazole (PROTONIX) 40 MG tablet Take 1 tablet (40 mg total) by mouth daily. (Patient taking differently: Take 40 mg by mouth every evening. ) 90 tablet 1  . potassium chloride (K-DUR,KLOR-CON) 20 MEQ tablet Take 1 tablet (20 mEq total) by mouth daily. 30 tablet 6  . Rivaroxaban (XARELTO) 15 MG TABS tablet Take 1 tablet (15 mg total) by mouth daily with supper. 30 tablet 6  . torsemide (DEMADEX) 20 MG tablet Take 1 tablet (20 mg total) by mouth daily. Can take extra 20 mg for weight gain of 3 lbs overnight or 5 lbs within one week. 30 tablet 6  . traMADol (ULTRAM) 50 MG tablet Take 1 tablet (50 mg total) by mouth 3 (three) times daily as needed (for pain.).  (Patient taking differently: Take 50 mg by mouth daily as needed for moderate pain. ) 90 tablet 2  . ursodiol (ACTIGALL) 300 MG capsule Take 300-600 mg by mouth See admin instructions. Take 2 capsules (600 mg) daily after lunch & 1 capsule (300 mg) at night.     No current facility-administered medications for this encounter.     Allergies  Allergen Reactions  . Statins Other (See Comments)    Affect her liver, Caused liver problems   . Haldol [Haloperidol Lactate] Rash  . Librax [Chlordiazepoxide-Clidinium] Rash  . Plavix [Clopidogrel Bisulfate] Rash  . Ramipril Rash      Social History   Socioeconomic History  . Marital status: Widowed    Spouse name: Not on file  . Number of children: Not on  file  . Years of education: Not on file  . Highest education level: Not on file  Occupational History  . Not on file  Social Needs  . Financial resource strain: Not on file  . Food insecurity:    Worry: Not on file    Inability: Not on file  . Transportation needs:    Medical: Not on file    Non-medical: Not on file  Tobacco Use  . Smoking status: Never Smoker  . Smokeless tobacco: Never Used  Substance and Sexual Activity  . Alcohol use: No    Alcohol/week: 0.0 oz  . Drug use: No  . Sexual activity: Never  Lifestyle  . Physical activity:    Days per week: Not on file    Minutes per session: Not on file  . Stress: Not on file  Relationships  . Social connections:    Talks on phone: Not on file    Gets together: Not on file    Attends religious service: Not on file    Active member of club or organization: Not on file    Attends meetings of clubs or organizations: Not on file    Relationship status: Not on file  . Intimate partner violence:    Fear of current or ex partner: Not on file    Emotionally abused: Not on file    Physically abused: Not on file    Forced sexual activity: Not on file  Other Topics Concern  . Not on file  Social History Narrative  . Not on  file      Family History  Problem Relation Age of Onset  . Stroke Mother   . Esophageal cancer Brother        also had lung cancer  . Cancer Daughter        colon  . Ovarian cancer Daughter   . Breast cancer Neg Hx   . Colon cancer Neg Hx     Vitals:   09/05/17 0950  BP: 127/67  Pulse: 87  SpO2: 95%  Weight: 125 lb 12.8 oz (57.1 kg)   Wt Readings from Last 3 Encounters:  09/05/17 125 lb 12.8 oz (57.1 kg)  08/28/17 121 lb 4.8 oz (55 kg)  08/21/17 146 lb (66.2 kg)    PHYSICAL EXAM: General:  Elderly appearing. No respiratory difficulty HEENT: normal anicteric Neck: supple. no JVD. Carotids 2+ bilat; no bruits. No lymphadenopathy or thyromegaly appreciated. Cor: PMI nondisplaced. Irregular rate & rhythm. No MR Lungs: clear no weeze Abdomen: soft, nontender, nondistended. No hepatosplenomegaly. No bruits or masses. Good bowel sounds. Extremities: no cyanosis, clubbing, rash, edema Neuro: alert & oriented x 3, cranial nerves grossly intact. moves all 4 extremities w/o difficulty. Affect pleasant  ECG: Atrial flutter, 87 bpm. Personally reviewed.   Orthostatics: Sitting: 120/72 Standing: 128/74  ASSESSMENT & PLAN:  1.Acute on chronic diastolic congestive heart failure with low output: - Echo 08/01/17 LVEF 50-55%, Mild AS, Mod MR, Mod LAE, Mild RAE, Mod TR, RV moderately HK PA peak pressure 65 mm Hg. - Cath 08/21/17 with signficantly elevated filling pressures, severe MR, and low cardiac output - Had cardiorenal syndrome on admit with severe MR and RV failure and required milrinone - NYHA II-III, confounded by limited activity due to fatigue - Volume status stable on exam - Continue torsemide 20 mg daily - Continue Toprol 100 mg BID for rate control. Low threshold to decrease - Refer to cardiac rehab  2. Severe mitral regurgitation: - Echo 08/01/17 LVEF  50-55%, Mild AS, Mod MR, Mod LAE, Mild RAE, Mod TR, PA peak pressure 65 mm Hg.MR ++ calcified, and RV  mild/moderately reduced function. - TEE 6/17 showed only mild MR with restricted posterior leaflet. Dr Haroldine Laws reviewed with structural heart team.  - Per Dr Haroldine Laws --->felt this is truly functional MR and MR improved with hemodynamic optimization. Suspect we will not be able to keep her this way off milrinone. Will follow closely.  - Repeat echo now that she has been diuresed.   3. Persistent atrial fibrillation: - Has previously failed amiodarone and DCCV.  - Rate controlled.   - Continue Toprol 100 mg BID for rate control. Low threshold to decrease - Continue Xarelto. She had a nosebleed, but no other bleeding. CBC today - Consider amio + ranexa next visit if she isn't feeling better   4. Essential hypertension: - Well controlled today.    5. Recent AKI - Due to cardiorenal syndrome.  - Check BMET today  CBC, BMET Repeat echo  Cardiac rehab  Georgiana Shore, NP 09/05/17   Patient seen and examined with the above-signed Advanced Practice Provider and/or Housestaff. I personally reviewed laboratory data, imaging studies and relevant notes. I independently examined the patient and formulated the important aspects of the plan. I have edited the note to reflect any of my changes or salient points. I have personally discussed the plan with the patient and/or family.  Challenging situation. She was recently in the hospital and with milrinone support we were able to diurese her over 20 pounds. Echo after diuresis showed mild MR at the worst. (probably trivial. Reviewed with Dr. Burt Knack of the structural heart team) - confirming the fact that she has functional MR. She feels a bit better with diuresis but still not back to baseline confirming the fact that AF is really hard for her to tolerate. Unfortunately she has failed DC-CV x 3 even with amio support.  We had a long discussion about the options: 1. Continue current therapy with referral to CR to see if she improves with  rehab 2. Home milrinone 3. Attempt to restore NSR either with rechallenge of amio + Ranexa or referral to [facility] for AF ablation  I do not think option #2 is reasonable as it is not long-term solution. For now, they want to try #1. Will also repeat echo to reassess MR now that she is further out from milrinone. I cannot hear MR on exam today.   Glori Bickers, MD  12:25 PM

## 2017-09-05 NOTE — Patient Instructions (Signed)
Your physician has requested that you have an echocardiogram. Echocardiography is a painless test that uses sound waves to create images of your heart. It provides your doctor with information about the size and shape of your heart and how well your heart's chambers and valves are working. This procedure takes approximately one hour. There are no restrictions for this procedure. (they will call you)    You have been referred to Cardiac rehab (they will call you)    Labs drawn today (if we do not call you, then your lab work was stable)   Your physician recommends that you schedule a follow-up appointment in: 2 months with Dr. Haroldine Laws

## 2017-09-08 ENCOUNTER — Telehealth (HOSPITAL_COMMUNITY): Payer: Self-pay | Admitting: *Deleted

## 2017-09-08 DIAGNOSIS — I2510 Atherosclerotic heart disease of native coronary artery without angina pectoris: Secondary | ICD-10-CM | POA: Diagnosis not present

## 2017-09-08 DIAGNOSIS — I481 Persistent atrial fibrillation: Secondary | ICD-10-CM | POA: Diagnosis not present

## 2017-09-08 DIAGNOSIS — I5033 Acute on chronic diastolic (congestive) heart failure: Secondary | ICD-10-CM | POA: Diagnosis not present

## 2017-09-08 DIAGNOSIS — I340 Nonrheumatic mitral (valve) insufficiency: Secondary | ICD-10-CM | POA: Diagnosis not present

## 2017-09-08 DIAGNOSIS — I5032 Chronic diastolic (congestive) heart failure: Secondary | ICD-10-CM

## 2017-09-08 DIAGNOSIS — I110 Hypertensive heart disease with heart failure: Secondary | ICD-10-CM | POA: Diagnosis not present

## 2017-09-08 DIAGNOSIS — I272 Pulmonary hypertension, unspecified: Secondary | ICD-10-CM | POA: Diagnosis not present

## 2017-09-08 NOTE — Telephone Encounter (Signed)
Notes recorded by Scarlette Calico, RN on 09/08/2017 at 4:38 PM EDT Pt aware, she has [facility] w/[facility], order sent to them for labs ------  Notes recorded by Jolaine Artist, MD on 09/06/2017 at 12:18 AM EDT Creatinine up slightly. Please repeat in [place] office in 1 week.

## 2017-09-09 DIAGNOSIS — I110 Hypertensive heart disease with heart failure: Secondary | ICD-10-CM | POA: Diagnosis not present

## 2017-09-09 DIAGNOSIS — I5033 Acute on chronic diastolic (congestive) heart failure: Secondary | ICD-10-CM | POA: Diagnosis not present

## 2017-09-09 DIAGNOSIS — I272 Pulmonary hypertension, unspecified: Secondary | ICD-10-CM | POA: Diagnosis not present

## 2017-09-09 DIAGNOSIS — I2510 Atherosclerotic heart disease of native coronary artery without angina pectoris: Secondary | ICD-10-CM | POA: Diagnosis not present

## 2017-09-09 DIAGNOSIS — I481 Persistent atrial fibrillation: Secondary | ICD-10-CM | POA: Diagnosis not present

## 2017-09-09 DIAGNOSIS — I340 Nonrheumatic mitral (valve) insufficiency: Secondary | ICD-10-CM | POA: Diagnosis not present

## 2017-09-10 ENCOUNTER — Other Ambulatory Visit: Payer: Self-pay | Admitting: *Deleted

## 2017-09-10 DIAGNOSIS — I272 Pulmonary hypertension, unspecified: Secondary | ICD-10-CM | POA: Diagnosis not present

## 2017-09-10 DIAGNOSIS — I110 Hypertensive heart disease with heart failure: Secondary | ICD-10-CM | POA: Diagnosis not present

## 2017-09-10 DIAGNOSIS — I5033 Acute on chronic diastolic (congestive) heart failure: Secondary | ICD-10-CM | POA: Diagnosis not present

## 2017-09-10 DIAGNOSIS — I340 Nonrheumatic mitral (valve) insufficiency: Secondary | ICD-10-CM | POA: Diagnosis not present

## 2017-09-10 DIAGNOSIS — I5032 Chronic diastolic (congestive) heart failure: Secondary | ICD-10-CM

## 2017-09-10 DIAGNOSIS — I481 Persistent atrial fibrillation: Secondary | ICD-10-CM | POA: Diagnosis not present

## 2017-09-10 DIAGNOSIS — I2510 Atherosclerotic heart disease of native coronary artery without angina pectoris: Secondary | ICD-10-CM | POA: Diagnosis not present

## 2017-09-11 DIAGNOSIS — I340 Nonrheumatic mitral (valve) insufficiency: Secondary | ICD-10-CM | POA: Diagnosis not present

## 2017-09-11 DIAGNOSIS — I110 Hypertensive heart disease with heart failure: Secondary | ICD-10-CM | POA: Diagnosis not present

## 2017-09-11 DIAGNOSIS — I481 Persistent atrial fibrillation: Secondary | ICD-10-CM | POA: Diagnosis not present

## 2017-09-11 DIAGNOSIS — I5033 Acute on chronic diastolic (congestive) heart failure: Secondary | ICD-10-CM | POA: Diagnosis not present

## 2017-09-11 DIAGNOSIS — I272 Pulmonary hypertension, unspecified: Secondary | ICD-10-CM | POA: Diagnosis not present

## 2017-09-11 DIAGNOSIS — I2510 Atherosclerotic heart disease of native coronary artery without angina pectoris: Secondary | ICD-10-CM | POA: Diagnosis not present

## 2017-09-15 ENCOUNTER — Telehealth: Payer: Self-pay | Admitting: *Deleted

## 2017-09-15 DIAGNOSIS — I5033 Acute on chronic diastolic (congestive) heart failure: Secondary | ICD-10-CM | POA: Diagnosis not present

## 2017-09-15 DIAGNOSIS — I2510 Atherosclerotic heart disease of native coronary artery without angina pectoris: Secondary | ICD-10-CM | POA: Diagnosis not present

## 2017-09-15 DIAGNOSIS — I481 Persistent atrial fibrillation: Secondary | ICD-10-CM | POA: Diagnosis not present

## 2017-09-15 DIAGNOSIS — I340 Nonrheumatic mitral (valve) insufficiency: Secondary | ICD-10-CM | POA: Diagnosis not present

## 2017-09-15 DIAGNOSIS — I272 Pulmonary hypertension, unspecified: Secondary | ICD-10-CM | POA: Diagnosis not present

## 2017-09-15 DIAGNOSIS — I110 Hypertensive heart disease with heart failure: Secondary | ICD-10-CM | POA: Diagnosis not present

## 2017-09-15 NOTE — Telephone Encounter (Signed)
Patient has not used anything new refuses NP appointment for this week and is willing to wait until 10/02/17 to see PCP.

## 2017-09-15 NOTE — Telephone Encounter (Signed)
Copied from [place] [phone]. Topic: General - Other >> Sep 15, 2017  4:44 PM Yvette Rack wrote: Reason for CRM: Annette with [facility] states pt is experiencing a rash all over her legs and back. Anne Ng states there are bruises from pt scratching. Pt has an appt scheduled for 10/02/17 at 1:30 pm but would like to come in sooner if possible. Pt only wants to see Dr. Nicki Reaper. Cb# [phone]

## 2017-09-16 NOTE — Telephone Encounter (Signed)
Patient scheduled.

## 2017-09-16 NOTE — Telephone Encounter (Signed)
I can see her 09/18/17 at 2:30.  Confirm no other acute issue.  Thanks.

## 2017-09-17 DIAGNOSIS — I509 Heart failure, unspecified: Secondary | ICD-10-CM | POA: Diagnosis not present

## 2017-09-17 DIAGNOSIS — I2510 Atherosclerotic heart disease of native coronary artery without angina pectoris: Secondary | ICD-10-CM | POA: Diagnosis not present

## 2017-09-17 DIAGNOSIS — I110 Hypertensive heart disease with heart failure: Secondary | ICD-10-CM | POA: Diagnosis not present

## 2017-09-17 DIAGNOSIS — I272 Pulmonary hypertension, unspecified: Secondary | ICD-10-CM | POA: Diagnosis not present

## 2017-09-17 DIAGNOSIS — I340 Nonrheumatic mitral (valve) insufficiency: Secondary | ICD-10-CM | POA: Diagnosis not present

## 2017-09-17 DIAGNOSIS — I481 Persistent atrial fibrillation: Secondary | ICD-10-CM | POA: Diagnosis not present

## 2017-09-17 DIAGNOSIS — I5033 Acute on chronic diastolic (congestive) heart failure: Secondary | ICD-10-CM | POA: Diagnosis not present

## 2017-09-18 ENCOUNTER — Encounter: Payer: Self-pay | Admitting: Internal Medicine

## 2017-09-18 ENCOUNTER — Ambulatory Visit (INDEPENDENT_AMBULATORY_CARE_PROVIDER_SITE_OTHER): Payer: Medicare Other | Admitting: Internal Medicine

## 2017-09-18 ENCOUNTER — Ambulatory Visit: Payer: Self-pay | Admitting: *Deleted

## 2017-09-18 DIAGNOSIS — K219 Gastro-esophageal reflux disease without esophagitis: Secondary | ICD-10-CM

## 2017-09-18 DIAGNOSIS — I340 Nonrheumatic mitral (valve) insufficiency: Secondary | ICD-10-CM

## 2017-09-18 DIAGNOSIS — K754 Autoimmune hepatitis: Secondary | ICD-10-CM | POA: Diagnosis not present

## 2017-09-18 DIAGNOSIS — I481 Persistent atrial fibrillation: Secondary | ICD-10-CM

## 2017-09-18 DIAGNOSIS — I5031 Acute diastolic (congestive) heart failure: Secondary | ICD-10-CM

## 2017-09-18 DIAGNOSIS — E7800 Pure hypercholesterolemia, unspecified: Secondary | ICD-10-CM | POA: Diagnosis not present

## 2017-09-18 DIAGNOSIS — I4819 Other persistent atrial fibrillation: Secondary | ICD-10-CM

## 2017-09-18 DIAGNOSIS — I2510 Atherosclerotic heart disease of native coronary artery without angina pectoris: Secondary | ICD-10-CM | POA: Diagnosis not present

## 2017-09-18 DIAGNOSIS — D649 Anemia, unspecified: Secondary | ICD-10-CM | POA: Diagnosis not present

## 2017-09-18 DIAGNOSIS — E039 Hypothyroidism, unspecified: Secondary | ICD-10-CM | POA: Diagnosis not present

## 2017-09-18 DIAGNOSIS — I25118 Atherosclerotic heart disease of native coronary artery with other forms of angina pectoris: Secondary | ICD-10-CM | POA: Diagnosis not present

## 2017-09-18 DIAGNOSIS — I10 Essential (primary) hypertension: Secondary | ICD-10-CM

## 2017-09-18 DIAGNOSIS — I5033 Acute on chronic diastolic (congestive) heart failure: Secondary | ICD-10-CM | POA: Diagnosis not present

## 2017-09-18 DIAGNOSIS — I110 Hypertensive heart disease with heart failure: Secondary | ICD-10-CM | POA: Diagnosis not present

## 2017-09-18 DIAGNOSIS — I272 Pulmonary hypertension, unspecified: Secondary | ICD-10-CM | POA: Diagnosis not present

## 2017-09-18 MED ORDER — TRIAMCINOLONE ACETONIDE 0.1 % EX CREA: 1.0000 "application " | TOPICAL_CREAM | Freq: Two times a day (BID) | CUTANEOUS | 0 refills | Status: DC

## 2017-09-18 MED ORDER — RANITIDINE HCL 150 MG PO TABS: 150.0000 mg | ORAL_TABLET | Freq: Every day | ORAL | 2 refills | Status: DC

## 2017-09-18 NOTE — Telephone Encounter (Signed)
Pt c/o itching "everywhere" Rash began last Friday. When pt scratches a raised area comes up. She is having rash mainly breast area, neck, hair line,arms, back and scalp. The rash is red.  Pt denies dizziness, headache, sore throat, joint pain.  Pt has appt today with Dr Nicki Reaper Reason for Disposition . SEVERE itching (i.e., interferes with sleep, normal activities or school)  Answer Assessment - Initial Assessment Questions 1. APPEARANCE of RASH: "Describe the rash." (e.g., spots, blisters, raised areas, skin peeling, scaly)     Bumps- becomes raised when scratched- scratches until bleeds 2. SIZE: "How big are the spots?" (e.g., tip of pen, eraser, coin; inches, centimeters) Eraser and other hat pin 3. LOCATION: "Where is the rash located?"     Breast area, neck, hair line ,arms, back, scalp 4. COLOR: "What color is the rash?" (Note: It is difficult to assess rash color in people with darker-colored skin. When this situation occurs, simply ask the caller to describe what they see.)     red 5. ONSET: "When did the rash begin?"     Last Friday 6. FEVER: "Do you have a fever?" If so, ask: "What is your temperature, how was it measured, and when did it start?"     no 7. ITCHING: "Does the rash itch?" If so, ask: "How bad is the itch?" (Scale 1-10; or mild, moderate, severe)     Itches severe 8. CAUSE: "What do you think is causing the rash?"     Pt does not know 9. MEDICATION FACTORS: "Have you started any new medications within the last 2 weeks?" (e.g., antibiotics)      no 10. OTHER SYMPTOMS: "Do you have any other symptoms?" (e.g., dizziness, headache, sore throat, joint pain)       no 11. PREGNANCY: "Is there any chance you are pregnant?" "When was your last menstrual period?"       n/a  Protocols used: RASH OR REDNESS - [facility]

## 2017-09-18 NOTE — Progress Notes (Signed)
Patient ID: QUINTELLA MURA, female   DOB: 04/16/1928, 84 y.o.   MRN: [id number]   Subjective:    Patient ID: [name], female    DOB: [birth year], 84 y.o.   MRN: [id number]  HPI  Patient here for a scheduled follow up.  She was admitted 08/21/17 s/p right and left hear cath which showed elevated filling pressures, severe MR and low cardiac output.  Required milrinone and lasix.  Diuresed 25 pounds.  Transitioned to 20mg  torsemide prior to discharge.  MR improved with treatment.  Saw cardiology 09/05/17.  Recommended continuing torsemide and toprol.  Remains in afib.  Rate controlled.  Since her hospitalization, she reports feeling better.  Breathing better.  No chest pain.  Eating.  No nausea or vomiting.  Bowels moving.  Planning for cardiac rehab.     Past Medical History:  Diagnosis Date  . (HFpEF) heart failure with preserved ejection fraction ([place])    a. 07/2017 Echo: EF 50-55%, no rwma, mild AS/AI, mod MR, mod dil LA, mod TR, PASP 65mmHg.  . Autoimmune hepatitis ([place])    followed by Dr [name]  . Fibrocystic breast disease   . Glaucoma   . Hypercholesterolemia   . Hypertension   . Hypothyroidism   . Inflammatory arthritis   . MI (myocardial infarction) ([place])   . Neuropathy   . Non-obstructive Coronary artery disease    a. 05/2014 NSTEMI/Cath: LM nl, LAD mild dzs, LCX 60ost hazy (? culprit), RCA mild dzs. EF 60% by echo-->Med Rx; b. 08/2017 Cath: LM 30ost, LAD min irregs, LCX small, 40ost/p, RCA large, min irregs, RPDA/RPL1 nl, EF 50-55%. 4+MR.  . Osteoarthritis   . Permanent atrial fibrillation (HCC)    a. CHA2DS2VASc = 6-->Xarelto.  . 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 Severe mitral regurgitation    a. 07/2017 Echo: Mod MR; b. 08/2017 Cath: 4+/Severe MR.   Past Surgical History:  Procedure Laterality Date  . ABDOMINAL HYSTERECTOMY  1963   partial, secondary to fibroids  . APPENDECTOMY    . Billroth    . CARDIAC  CATHETERIZATION  05/12/14   [facility]  . CARDIOVERSION N/A 12/26/2016   Procedure: CARDIOVERSION;  Surgeon: [name], MD;  Location: [facility] ORS;  Service: Cardiovascular;  Laterality: N/A;  . CARDIOVERSION N/A 05/16/2017   Procedure: CARDIOVERSION;  Surgeon: [name], MD;  Location: [facility] ORS;  Service: Cardiovascular;  Laterality: N/A;  . CARDIOVERSION N/A 06/09/2017   Procedure: CARDIOVERSION;  Surgeon: [name], MD;  Location: [facility] ORS;  Service: Cardiovascular;  Laterality: N/A;  . CHOLECYSTECTOMY  1998  . COLONOSCOPY  2009  . RIGHT/LEFT HEART CATH AND CORONARY ANGIOGRAPHY Bilateral 08/21/2017   Procedure: RIGHT/LEFT HEART CATH AND CORONARY ANGIOGRAPHY;  Surgeon: [name], MD;  Location: [place] [facility];  Service: Cardiovascular;  Laterality: Bilateral;  . TEE WITHOUT CARDIOVERSION N/A 08/25/2017   Procedure: TRANSESOPHAGEAL ECHOCARDIOGRAM (TEE);  Surgeon: Jolaine Artist, MD;  Location: [facility] END[facility]COPY;  Service: Cardiovascular;  Laterality: N/A;  . UPPER GI ENDOSCOPY  2004   Family History  Problem Relation Age of Onset  . Stroke Mother   . Esophageal cancer Brother        also had lung cancer  . Cancer Daughter        colon  . Ovarian cancer Daughter   . Breast cancer Neg Hx   . Colon cancer Neg Hx    Social History  Socioeconomic History  . Marital status: Widowed    Spouse name: Not on file  . Number of children: Not on file  . Years of education: Not on file  . Highest education level: Not on file  Occupational History  . Not on file  Social Needs  . Financial resource strain: Not on file  . Food insecurity:    Worry: Not on file    Inability: Not on file  . Transportation needs:    Medical: Not on file    Non-medical: Not on file  Tobacco Use  . Smoking status: Never Smoker  . Smokeless tobacco: Never Used  Substance and Sexual Activity  . Alcohol use: No    Alcohol/week: 0.0 oz  . Drug use: No  . Sexual activity: Never    Lifestyle  . Physical activity:    Days per week: Not on file    Minutes per session: Not on file  . Stress: Not on file  Relationships  . Social connections:    Talks on phone: Not on file    Gets together: Not on file    Attends religious service: Not on file    Active member of club or organization: Not on file    Attends meetings of clubs or organizations: Not on file    Relationship status: Not on file  Other Topics Concern  . Not on file  Social History Narrative  . Not on file    Outpatient Encounter Medications as of 09/18/2017  Medication Sig  . brimonidine-timolol (COMBIGAN) 0.2-0.5 % ophthalmic solution Place 1 drop into the left eye 2 (two) times daily.   . dorzolamide (TRUSOPT) 2 % ophthalmic solution Place 1 drop into the left eye 2 (two) times daily.  . fluticasone (FLONASE) 50 MCG/ACT nasal spray Place 2 sprays into both nostrils daily as needed for allergies.   Marland Kitchen gabapentin (NEURONTIN) 300 MG capsule TAKE 2 CAPSULES BY MOUTH FOUR TIMES DAILY  . ipratropium (ATROVENT) 0.06 % nasal spray Place 2 sprays into both nostrils daily as needed for rhinitis.   Marland Kitchen latanoprost (XALATAN) 0.005 % ophthalmic solution Place 1 drop into both eyes at bedtime.   . metoprolol tartrate (LOPRESSOR) 100 MG tablet Take 1 tablet (100 mg total) by mouth 2 (two) times daily.  . pantoprazole (PROTONIX) 40 MG tablet Take 1 tablet (40 mg total) by mouth daily. (Patient taking differently: Take 40 mg by mouth every evening. )  . potassium chloride (K-DUR,KLOR-CON) 20 MEQ tablet Take 1 tablet (20 mEq total) by mouth daily.  . Rivaroxaban (XARELTO) 15 MG TABS tablet Take 1 tablet (15 mg total) by mouth daily with supper.  . torsemide (DEMADEX) 20 MG tablet Take 1 tablet (20 mg total) by mouth daily. Can take extra 20 mg for weight gain of 3 lbs overnight or 5 lbs within one week.  . traMADol (ULTRAM) 50 MG tablet Take 1 tablet (50 mg total) by mouth 3 (three) times daily as needed (for pain.).  (Patient taking differently: Take 50 mg by mouth daily as needed for moderate pain. )  . ursodiol (ACTIGALL) 300 MG capsule Take 300-600 mg by mouth See admin instructions. Take 2 capsules (600 mg) daily after lunch & 1 capsule (300 mg) at night.  . [DISCONTINUED] levothyroxine (SYNTHROID, LEVOTHROID) 112 MCG tablet TAKE 1 TABLET BY MOUTH EVERY DAY BEFORE BREAKFAST  . ranitidine (ZANTAC) 150 MG tablet Take 1 tablet (150 mg total) by mouth daily. Take one tablet 30 minutes before breakfast  .  triamcinolone cream (KENALOG) 0.1 % Apply 1 application topically 2 (two) times daily.   No facility-administered encounter medications on file as of 09/18/2017.     Review of Systems  Constitutional: Negative for appetite change and unexpected weight change.  HENT: Negative for congestion and sinus pressure.   Respiratory: Negative for cough and chest tightness.        Breathing better.    Cardiovascular: Negative for chest pain, palpitations and leg swelling.  Gastrointestinal: Negative for abdominal pain, diarrhea, nausea and vomiting.  Genitourinary: Negative for difficulty urinating and dysuria.  Musculoskeletal: Negative for joint swelling and myalgias.  Skin: Negative for color change and rash.  Neurological: Negative for dizziness, light-headedness and headaches.  Psychiatric/Behavioral: Negative for agitation and dysphoric mood.       Objective:     Blood pressure rechecked by me:  118/58  Physical Exam  Constitutional: She appears well-developed and well-nourished. No distress.  HENT:  Nose: Nose normal.  Mouth/Throat: Oropharynx is clear and moist.  Neck: Neck supple. No thyromegaly present.  Cardiovascular: Normal rate.  Rated controlled.   Pulmonary/Chest: Breath sounds normal. No respiratory distress. She has no wheezes.  Abdominal: Soft. Bowel sounds are normal. There is no tenderness.  Musculoskeletal: She exhibits no edema or tenderness.  Lymphadenopathy:    She has no  cervical adenopathy.  Skin: No rash noted. No erythema.  Psychiatric: She has a normal mood and affect. Her behavior is normal.    BP 110/60 (BP Location: Right Arm, Patient Position: Sitting, Cuff Size: Normal)   Pulse (!) 50   Wt 125 lb 12.8 oz (57.1 kg)   SpO2 97%   BMI 23.01 kg/m  Wt Readings from Last 3 Encounters:  09/18/17 125 lb 12.8 oz (57.1 kg)  09/05/17 125 lb 12.8 oz (57.1 kg)  08/28/17 121 lb 4.8 oz (55 kg)     Lab Results  Component Value Date   WBC 8.9 09/05/2017   HGB 10.0 (L) 09/05/2017   HCT 34.1 (L) 09/05/2017   PLT 323 09/05/2017   GLUCOSE 85 09/05/2017   CHOL 159 05/27/2017   TRIG 101.0 05/27/2017   HDL 64.30 05/27/2017   LDLDIRECT 143.5 12/17/2012   LDLCALC 74 05/27/2017   ALT 98 (H) 08/21/2017   AST 52 (H) 08/21/2017   NA 139 09/05/2017   K 4.4 09/05/2017   CL 99 09/05/2017   CREATININE 1.78 (H) 09/05/2017   BUN 55 (H) 09/05/2017   CO2 29 09/05/2017   TSH 1.888 08/21/2017   INR 1.36 08/21/2017   HGBA1C 5.7 05/27/2017       Assessment & Plan:   Problem List Items Addressed This Visit    Acute diastolic CHF (congestive heart failure) ([place])    Recently admitted as outlined.  Diuresed.  Doing better.  Continue current medication regimen.        Anemia    Follow cbc.       Autoimmune hepatitis ([place])    Followed by GI.  Follow liver function tests.        Coronary artery disease    Stable.  Followed by cardiology.        GERD (gastroesophageal reflux disease)    Controlled on current regimen.        Relevant Medications   ranitidine (ZANTAC) 150 MG tablet   Hypercholesterolemia    Unable to take statin medication secondary to her underlying liver issues.  Follow lipid panel.        Hypertension  Blood pressure under good control.  Continue same medication regimen.  Follow pressures.  Follow metabolic panel.        Hypothyroidism    On thyroid replacement.  Follow tsh.        Persistent atrial fibrillation (HCC)     S/p previous cardioversion.  Followed by cardiology.        Severe mitral regurgitation    Improved after diuresis.  Being followed by cardiology.           Einar Pheasant, MD

## 2017-09-21 ENCOUNTER — Other Ambulatory Visit: Payer: Self-pay | Admitting: Internal Medicine

## 2017-09-22 ENCOUNTER — Other Ambulatory Visit (HOSPITAL_COMMUNITY): Payer: Self-pay | Admitting: Internal Medicine

## 2017-09-22 ENCOUNTER — Telehealth (HOSPITAL_COMMUNITY): Payer: Self-pay | Admitting: *Deleted

## 2017-09-22 ENCOUNTER — Encounter: Payer: Self-pay | Admitting: Internal Medicine

## 2017-09-22 DIAGNOSIS — I272 Pulmonary hypertension, unspecified: Secondary | ICD-10-CM | POA: Diagnosis not present

## 2017-09-22 DIAGNOSIS — I481 Persistent atrial fibrillation: Secondary | ICD-10-CM | POA: Diagnosis not present

## 2017-09-22 DIAGNOSIS — I340 Nonrheumatic mitral (valve) insufficiency: Secondary | ICD-10-CM | POA: Diagnosis not present

## 2017-09-22 DIAGNOSIS — I2510 Atherosclerotic heart disease of native coronary artery without angina pectoris: Secondary | ICD-10-CM | POA: Diagnosis not present

## 2017-09-22 DIAGNOSIS — I110 Hypertensive heart disease with heart failure: Secondary | ICD-10-CM | POA: Diagnosis not present

## 2017-09-22 DIAGNOSIS — I5033 Acute on chronic diastolic (congestive) heart failure: Secondary | ICD-10-CM | POA: Diagnosis not present

## 2017-09-22 NOTE — Assessment & Plan Note (Signed)
S/p previous cardioversion.  Followed by cardiology.

## 2017-09-22 NOTE — Telephone Encounter (Signed)
HHRN called to report pt was not feeling well, she was tired, irregular heart rate 64-79. Pt has persistent afib. Pts weight is down to 119lbs. Per Jonni Sanger she can hold torsemide for 1-2 days. HHRN aware and agreeable with plan.

## 2017-09-22 NOTE — Assessment & Plan Note (Signed)
Stable.  Followed by cardiology.   

## 2017-09-22 NOTE — Assessment & Plan Note (Signed)
Follow cbc.  

## 2017-09-22 NOTE — Assessment & Plan Note (Signed)
Blood pressure under good control.  Continue same medication regimen.  Follow pressures.  Follow metabolic panel.   

## 2017-09-22 NOTE — Assessment & Plan Note (Signed)
Followed by GI.  Follow liver function tests.  

## 2017-09-22 NOTE — Assessment & Plan Note (Signed)
Unable to take statin medication secondary to her underlying liver issues.  Follow lipid panel.

## 2017-09-22 NOTE — Assessment & Plan Note (Signed)
On thyroid replacement.  Follow tsh.  

## 2017-09-22 NOTE — Assessment & Plan Note (Signed)
Controlled on current regimen.   

## 2017-09-22 NOTE — Assessment & Plan Note (Signed)
Recently admitted as outlined.  Diuresed.  Doing better.  Continue current medication regimen.

## 2017-09-22 NOTE — Assessment & Plan Note (Signed)
Improved after diuresis.  Being followed by cardiology.

## 2017-09-25 ENCOUNTER — Other Ambulatory Visit: Payer: Self-pay | Admitting: Internal Medicine

## 2017-09-25 DIAGNOSIS — I2510 Atherosclerotic heart disease of native coronary artery without angina pectoris: Secondary | ICD-10-CM | POA: Diagnosis not present

## 2017-09-25 DIAGNOSIS — I110 Hypertensive heart disease with heart failure: Secondary | ICD-10-CM | POA: Diagnosis not present

## 2017-09-25 DIAGNOSIS — I481 Persistent atrial fibrillation: Secondary | ICD-10-CM | POA: Diagnosis not present

## 2017-09-25 DIAGNOSIS — I5033 Acute on chronic diastolic (congestive) heart failure: Secondary | ICD-10-CM | POA: Diagnosis not present

## 2017-09-25 DIAGNOSIS — I340 Nonrheumatic mitral (valve) insufficiency: Secondary | ICD-10-CM | POA: Diagnosis not present

## 2017-09-25 DIAGNOSIS — I272 Pulmonary hypertension, unspecified: Secondary | ICD-10-CM | POA: Diagnosis not present

## 2017-09-26 DIAGNOSIS — I481 Persistent atrial fibrillation: Secondary | ICD-10-CM | POA: Diagnosis not present

## 2017-09-26 DIAGNOSIS — I5033 Acute on chronic diastolic (congestive) heart failure: Secondary | ICD-10-CM | POA: Diagnosis not present

## 2017-09-26 DIAGNOSIS — I110 Hypertensive heart disease with heart failure: Secondary | ICD-10-CM | POA: Diagnosis not present

## 2017-09-26 DIAGNOSIS — I272 Pulmonary hypertension, unspecified: Secondary | ICD-10-CM | POA: Diagnosis not present

## 2017-09-26 DIAGNOSIS — I340 Nonrheumatic mitral (valve) insufficiency: Secondary | ICD-10-CM | POA: Diagnosis not present

## 2017-09-26 DIAGNOSIS — I2510 Atherosclerotic heart disease of native coronary artery without angina pectoris: Secondary | ICD-10-CM | POA: Diagnosis not present

## 2017-09-30 ENCOUNTER — Other Ambulatory Visit: Payer: Medicare Other

## 2017-10-01 DIAGNOSIS — I340 Nonrheumatic mitral (valve) insufficiency: Secondary | ICD-10-CM | POA: Diagnosis not present

## 2017-10-01 DIAGNOSIS — I2510 Atherosclerotic heart disease of native coronary artery without angina pectoris: Secondary | ICD-10-CM | POA: Diagnosis not present

## 2017-10-01 DIAGNOSIS — I110 Hypertensive heart disease with heart failure: Secondary | ICD-10-CM | POA: Diagnosis not present

## 2017-10-01 DIAGNOSIS — I481 Persistent atrial fibrillation: Secondary | ICD-10-CM | POA: Diagnosis not present

## 2017-10-01 DIAGNOSIS — I5033 Acute on chronic diastolic (congestive) heart failure: Secondary | ICD-10-CM | POA: Diagnosis not present

## 2017-10-01 DIAGNOSIS — I272 Pulmonary hypertension, unspecified: Secondary | ICD-10-CM | POA: Diagnosis not present

## 2017-10-02 ENCOUNTER — Ambulatory Visit (INDEPENDENT_AMBULATORY_CARE_PROVIDER_SITE_OTHER): Payer: Medicare Other | Admitting: Internal Medicine

## 2017-10-02 ENCOUNTER — Encounter: Payer: Self-pay | Admitting: Internal Medicine

## 2017-10-02 DIAGNOSIS — L299 Pruritus, unspecified: Secondary | ICD-10-CM | POA: Diagnosis not present

## 2017-10-02 DIAGNOSIS — I2510 Atherosclerotic heart disease of native coronary artery without angina pectoris: Secondary | ICD-10-CM

## 2017-10-02 DIAGNOSIS — I10 Essential (primary) hypertension: Secondary | ICD-10-CM | POA: Diagnosis not present

## 2017-10-02 DIAGNOSIS — E039 Hypothyroidism, unspecified: Secondary | ICD-10-CM | POA: Diagnosis not present

## 2017-10-02 DIAGNOSIS — K754 Autoimmune hepatitis: Secondary | ICD-10-CM

## 2017-10-02 DIAGNOSIS — I340 Nonrheumatic mitral (valve) insufficiency: Secondary | ICD-10-CM

## 2017-10-02 DIAGNOSIS — I272 Pulmonary hypertension, unspecified: Secondary | ICD-10-CM | POA: Diagnosis not present

## 2017-10-02 DIAGNOSIS — I4819 Other persistent atrial fibrillation: Secondary | ICD-10-CM

## 2017-10-02 DIAGNOSIS — K219 Gastro-esophageal reflux disease without esophagitis: Secondary | ICD-10-CM

## 2017-10-02 DIAGNOSIS — D649 Anemia, unspecified: Secondary | ICD-10-CM | POA: Diagnosis not present

## 2017-10-02 DIAGNOSIS — E7800 Pure hypercholesterolemia, unspecified: Secondary | ICD-10-CM | POA: Diagnosis not present

## 2017-10-02 DIAGNOSIS — I5032 Chronic diastolic (congestive) heart failure: Secondary | ICD-10-CM | POA: Diagnosis not present

## 2017-10-02 DIAGNOSIS — I25118 Atherosclerotic heart disease of native coronary artery with other forms of angina pectoris: Secondary | ICD-10-CM | POA: Diagnosis not present

## 2017-10-02 DIAGNOSIS — I5033 Acute on chronic diastolic (congestive) heart failure: Secondary | ICD-10-CM | POA: Diagnosis not present

## 2017-10-02 DIAGNOSIS — I481 Persistent atrial fibrillation: Secondary | ICD-10-CM

## 2017-10-02 DIAGNOSIS — I110 Hypertensive heart disease with heart failure: Secondary | ICD-10-CM | POA: Diagnosis not present

## 2017-10-02 MED ORDER — TRAMADOL HCL 50 MG PO TABS: 50.0000 mg | ORAL_TABLET | Freq: Two times a day (BID) | ORAL | 1 refills | Status: DC | PRN

## 2017-10-02 NOTE — Patient Instructions (Signed)
Allegra 30mg  - take one tablet daily as needed for itching

## 2017-10-02 NOTE — Progress Notes (Signed)
[name], female   DOB: Aug 03, 1928, 84 y.o.   MRN: [id number]   Subjective:    [name], female    DOB: April 04, 1928, 83 y.o.   MRN: [id number]  HPI  Patient here for a scheduled follow up.  She is accompanied by her daughter.  History obtained from both of them.  She reports she has noticed intermittent increased heart rate.  Breathing overall is better.  No chest pain.  Eating.  No nausea or vomiting.  Bowels moving.  Her main complaint is that of itching.  Some individual skin lesions, but no confluent rash.  No new exposures or contacts.  She did report itching previously when on blood thinner.  Due to see Dr Fletcher Anon tomorrow.     Past Medical History:  Diagnosis Date  . (HFpEF) heart failure with preserved ejection fraction ([place])    a. 07/2017 Echo: EF 50-55%, no rwma, mild AS/AI, mod MR, mod dil LA, mod TR, PASP 15mmHg.  . Autoimmune hepatitis ([place])    followed by Dr [name]  . Fibrocystic breast disease   . Glaucoma   . Hypercholesterolemia   . Hypertension   . Hypothyroidism   . Inflammatory arthritis   . MI (myocardial infarction) ([place])   . Neuropathy   . Non-obstructive Coronary artery disease    a. 05/2014 NSTEMI/Cath: LM nl, LAD mild dzs, LCX 60ost hazy (? culprit), RCA mild dzs. EF 60% by echo-->Med Rx; b. 08/2017 Cath: LM 30ost, LAD min irregs, LCX small, 40ost/p, RCA large, min irregs, RPDA/RPL1 nl, EF 50-55%. 4+MR.  . Osteoarthritis   . Permanent atrial fibrillation (HCC)    a. CHA2DS2VASc = 6-->Xarelto.  . 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 Severe mitral regurgitation    a. 07/2017 Echo: Mod MR; b. 08/2017 Cath: 4+/Severe MR.   Past Surgical History:  Procedure Laterality Date  . ABDOMINAL HYSTERECTOMY  1963   partial, secondary to fibroids  . APPENDECTOMY    . Billroth    . CARDIAC CATHETERIZATION  05/12/14   [facility]  . CARDIOVERSION N/A 12/26/2016   Procedure: CARDIOVERSION;   Surgeon: [name], MD;  Location: [facility] ORS;  Service: Cardiovascular;  Laterality: N/A;  . CARDIOVERSION N/A 05/16/2017   Procedure: CARDIOVERSION;  Surgeon: [name], MD;  Location: [facility] ORS;  Service: Cardiovascular;  Laterality: N/A;  . CARDIOVERSION N/A 06/09/2017   Procedure: CARDIOVERSION;  Surgeon: [name], MD;  Location: [facility] ORS;  Service: Cardiovascular;  Laterality: N/A;  . CHOLECYSTECTOMY  1998  . COLONOSCOPY  2009  . RIGHT/LEFT HEART CATH AND CORONARY ANGIOGRAPHY Bilateral 08/21/2017   Procedure: RIGHT/LEFT HEART CATH AND CORONARY ANGIOGRAPHY;  Surgeon: [name], MD;  Location: [place] CV LAB;  Service: Cardiovascular;  Laterality: Bilateral;  . TEE WITHOUT CARDIOVERSION N/A 08/25/2017   Procedure: TRANSESOPHAGEAL ECHOCARDIOGRAM (TEE);  Surgeon: Jolaine Artist, MD;  Location: [facility] ENDOSCOPY;  Service: Cardiovascular;  Laterality: N/A;  . UPPER GI ENDOSCOPY  2004   Family History  Problem Relation Age of Onset  . Stroke Mother   . Esophageal cancer Brother        also had lung cancer  . Cancer Daughter        colon  . Ovarian cancer Daughter   . Breast cancer Neg Hx   . Colon cancer Neg Hx    Social History   Socioeconomic History  . Marital status: Widowed  Spouse name: Not on file  . Number of children: Not on file  . Years of education: Not on file  . Highest education level: Not on file  Occupational History  . Not on file  Social Needs  . Financial resource strain: Not on file  . Food insecurity:    Worry: Not on file    Inability: Not on file  . Transportation needs:    Medical: Not on file    Non-medical: Not on file  Tobacco Use  . Smoking status: Never Smoker  . Smokeless tobacco: Never Used  Substance and Sexual Activity  . Alcohol use: No    Alcohol/week: 0.0 oz  . Drug use: No  . Sexual activity: Never  Lifestyle  . Physical activity:    Days per week: Not on file    Minutes per session: Not on file   . Stress: Not on file  Relationships  . Social connections:    Talks on phone: Not on file    Gets together: Not on file    Attends religious service: Not on file    Active member of club or organization: Not on file    Attends meetings of clubs or organizations: Not on file    Relationship status: Not on file  Other Topics Concern  . Not on file  Social History Narrative  . Not on file    Outpatient Encounter Medications as of 10/02/2017  Medication Sig  . brimonidine-timolol (COMBIGAN) 0.2-0.5 % ophthalmic solution Place 1 drop into the left eye 2 (two) times daily.   . dorzolamide (TRUSOPT) 2 % ophthalmic solution Place 1 drop into the left eye 2 (two) times daily.  . fluticasone (FLONASE) 50 MCG/ACT nasal spray Place 2 sprays into both nostrils daily as needed for allergies.   Marland Kitchen gabapentin (NEURONTIN) 300 MG capsule TAKE 2 CAPSULES BY MOUTH FOUR TIMES DAILY  . ipratropium (ATROVENT) 0.06 % nasal spray Place 2 sprays into both nostrils daily as needed for rhinitis.   Marland Kitchen latanoprost (XALATAN) 0.005 % ophthalmic solution Place 1 drop into both eyes at bedtime.   Marland Kitchen levothyroxine (SYNTHROID, LEVOTHROID) 112 MCG tablet TAKE 1 TABLET BY MOUTH EVERY DAY BEFORE BREAKFAST  . metoprolol tartrate (LOPRESSOR) 100 MG tablet Take 1 tablet (100 mg total) by mouth 2 (two) times daily.  . pantoprazole (PROTONIX) 40 MG tablet Take 1 tablet (40 mg total) by mouth daily. (Patient taking differently: Take 40 mg by mouth every evening. )  . potassium chloride (K-DUR,KLOR-CON) 20 MEQ tablet Take 1 tablet (20 mEq total) by mouth daily.  . ranitidine (ZANTAC) 150 MG tablet Take 1 tablet (150 mg total) by mouth daily. Take one tablet 30 minutes before breakfast  . Rivaroxaban (XARELTO) 15 MG TABS tablet Take 1 tablet (15 mg total) by mouth daily with supper.  . torsemide (DEMADEX) 20 MG tablet Take 1 tablet (20 mg total) by mouth daily. Can take extra 20 mg for weight gain of 3 lbs overnight or 5 lbs within  one week.  . traMADol (ULTRAM) 50 MG tablet Take 1 tablet (50 mg total) by mouth 2 (two) times daily as needed (for pain.).  Marland Kitchen triamcinolone cream (KENALOG) 0.1 % Apply 1 application topically 2 (two) times daily.  . ursodiol (ACTIGALL) 300 MG capsule Take 300-600 mg by mouth See admin instructions. Take 2 capsules (600 mg) daily after lunch & 1 capsule (300 mg) at night.  . [DISCONTINUED] traMADol (ULTRAM) 50 MG tablet Take 1 tablet (50 mg  total) by mouth 3 (three) times daily as needed (for pain.). (Patient taking differently: Take 50 mg by mouth daily as needed for moderate pain. )   No facility-administered encounter medications on file as of 10/02/2017.     Review of Systems  Constitutional: Negative for appetite change and unexpected weight change.  HENT: Negative for congestion and sinus pressure.   Respiratory: Negative for cough and chest tightness.        Breathing overall stable.    Cardiovascular: Negative for chest pain.       Some increased heart rate at times.    Gastrointestinal: Negative for abdominal pain, diarrhea, nausea and vomiting.  Genitourinary: Negative for difficulty urinating and dysuria.  Musculoskeletal: Negative for joint swelling and myalgias.  Skin:       Itching and some lesions as outlined.    Neurological: Negative for dizziness, light-headedness and headaches.  Psychiatric/Behavioral: Negative for agitation and dysphoric mood.       Objective:    Physical Exam  Constitutional: She appears well-developed and well-nourished. No distress.  HENT:  Nose: Nose normal.  Mouth/Throat: Oropharynx is clear and moist.  Neck: Neck supple. No thyromegaly present.  Cardiovascular: Normal rate and regular rhythm.  Pulmonary/Chest: Breath sounds normal. No respiratory distress. She has no wheezes.  Abdominal: Soft. Bowel sounds are normal. There is no tenderness.  Musculoskeletal: She exhibits no tenderness.  No increased swelling.    Lymphadenopathy:     She has no cervical adenopathy.  Skin: No erythema.  Individual lesions - no confluent rash.    Psychiatric: She has a normal mood and affect. Her behavior is normal.    BP 130/72 (BP Location: Left Arm, Patient Position: Sitting, Cuff Size: Normal)   Pulse 72   Temp 97.6 F (36.4 C) (Oral)   Resp 18   Wt 130 lb (59 kg)   SpO2 94%   BMI 23.78 kg/m  Wt Readings from Last 3 Encounters:  10/03/17 127 lb 4 oz (57.7 kg)  10/02/17 130 lb (59 kg)  09/18/17 125 lb 12.8 oz (57.1 kg)     Lab Results  Component Value Date   WBC 8.9 09/05/2017   HGB 10.0 (L) 09/05/2017   HCT 34.1 (L) 09/05/2017   PLT 323 09/05/2017   GLUCOSE 85 09/05/2017   CHOL 159 05/27/2017   TRIG 101.0 05/27/2017   HDL 64.30 05/27/2017   LDLDIRECT 143.5 12/17/2012   LDLCALC 74 05/27/2017   ALT 98 (H) 08/21/2017   AST 52 (H) 08/21/2017   NA 139 09/05/2017   K 4.4 09/05/2017   CL 99 09/05/2017   CREATININE 1.78 (H) 09/05/2017   BUN 55 (H) 09/05/2017   CO2 29 09/05/2017   TSH 1.888 08/21/2017   INR 1.36 08/21/2017   HGBA1C 5.7 05/27/2017       Assessment & Plan:   Problem List Items Addressed This Visit    Anemia    Follow cbc.        Autoimmune hepatitis ([place])    Followed by GI.  Follow liver function tests.        Chronic diastolic heart failure ([place])    Followed by cardiology.  Weight up a couple of pounds today on her check.  Has been stable.  Hold on making changes.  Continue current medication regimen.  Due to see Dr Fletcher Anon tomorrow.  Will weight tomorrow.  Monitor for change.  Discussed decreased salt intake.        Coronary artery disease  Stable.  Followed by cardiology.        GERD (gastroesophageal reflux disease)    Controlled on current regimen.        Hypercholesterolemia    Unable to take statin medication secondary to her underlying liver issues.  Follow lipid panel.        Hypertension    Blood pressure under good control.  Continue same medication regimen.  Follow  pressures.  Follow metabolic panel.        Hypothyroidism    On thyroid replacement.  Follow tsh.       Itching    Persistent itching.  Small scattered lesions.  No significant rash.  Unclear etiology. No new exposures or contacts.  Had issues since starting blood thinner.  Discussed starting low dose allegra.  She will d/w Dr Fletcher Anon.  Also refer to dermatology.        Relevant Orders   Ambulatory referral to Dermatology   Persistent atrial fibrillation [facility])    S/p cardioversion.  Still with intermittent increased heart rate as outlined.  Due to see Dr Fletcher Anon tomorrow.  Plans to discuss with him.        Severe mitral regurgitation    Improved after being diureses.  Followed by cardiology.            Einar Pheasant, MD

## 2017-10-03 ENCOUNTER — Encounter: Payer: Self-pay | Admitting: Cardiovascular Disease

## 2017-10-03 ENCOUNTER — Ambulatory Visit (INDEPENDENT_AMBULATORY_CARE_PROVIDER_SITE_OTHER): Payer: Medicare Other | Admitting: Cardiovascular Disease

## 2017-10-03 VITALS — BP 112/56 | HR 97 | Ht 62.0 in | Wt 127.2 lb

## 2017-10-03 DIAGNOSIS — I481 Persistent atrial fibrillation: Secondary | ICD-10-CM | POA: Diagnosis not present

## 2017-10-03 DIAGNOSIS — I2510 Atherosclerotic heart disease of native coronary artery without angina pectoris: Secondary | ICD-10-CM | POA: Diagnosis not present

## 2017-10-03 DIAGNOSIS — E785 Hyperlipidemia, unspecified: Secondary | ICD-10-CM | POA: Diagnosis not present

## 2017-10-03 DIAGNOSIS — I340 Nonrheumatic mitral (valve) insufficiency: Secondary | ICD-10-CM | POA: Diagnosis not present

## 2017-10-03 DIAGNOSIS — I5032 Chronic diastolic (congestive) heart failure: Secondary | ICD-10-CM

## 2017-10-03 DIAGNOSIS — I10 Essential (primary) hypertension: Secondary | ICD-10-CM | POA: Diagnosis not present

## 2017-10-03 DIAGNOSIS — I4819 Other persistent atrial fibrillation: Secondary | ICD-10-CM

## 2017-10-03 NOTE — Patient Instructions (Signed)
Medication Instructions: Continue same medications.   Labwork: None.   Procedures/Testing: None.   Follow-Up: 3 months with Dr. Blue Winther.   Any Additional Special Instructions Will Be Listed Below (If Applicable).     If you need a refill on your cardiac medications before your next appointment, please call your pharmacy.   

## 2017-10-03 NOTE — Progress Notes (Signed)
Cardiology Office Note   Date:  10/03/2017   ID:  Ruth Roth, DOB 04-Oct-1928, MRN [id number]  PCP:  Einar Pheasant, MD  Cardiologist:   [name], MD   Chief Complaint  Patient presents with  . Other    2 month follow up. Patient c/o SOB when tired and swelling in ankles. Meds reviewed verbally with patient.       History of Present Illness: Ruth Roth is a 84 y.o. female who presents for a follow-up visit regarding coronary artery disease ,chronic diastolic heart failure, mitral regurgitation and persistent atrial fibrillation .  She has known history of hypertension, peptic ulcer disease, fibrocystic breast disease, inflammatory arthritis, autoimmune hepatitis and neuropathy.  She had a small non-ST elevation myocardial infarction in March 2016 with acute diastolic heart failure after a GI illness.  Echocardiogram showed normal LV systolic function. Cardiac catheterization showed showed 60% ostial left circumflex hazy stenosis which was possibly the culprit. Mild LAD/RCA disease. She was treated medically.   She had cardioversion 3 times since October due to recurrent highly symptomatic atrial fibrillation.  Most recently was on April 1 after reloading with amiodarone.  Due to recurrent atrial fibrillation, I elected for rate control and discontinued amiodarone.  Rate control has been achieved with metoprolol but in spite of that she had worsening exertional dyspnea with minimal activities.  Recent echocardiogram showed an EF of 50 to 55%, mild aortic stenosis, moderate mitral regurgitation and severe pulmonary hypertension with estimated systolic pulmonary pressure of 65 mmHg.  I attempted increasing her diuretic but that was associated with worsening renal function.    I proceeded with a right and left cardiac catheterization in June which showed mild nonobstructive coronary artery disease.  Ejection fraction was 50 to 55% with severe mitral regurgitation.  Right heart  catheterization showed severely elevated filling pressures with moderate to severe pulmonary hypertension and severely reduced cardiac output.  I transferred the patient to [facility] where she was effectively diuresed with milrinone with subsequent improvement in symptoms.  She had a transesophageal echocardiogram done which showed normal LV systolic function with only mild mitral regurgitation.  The mitral regurgitation was felt to be functional with subsequent improvement after diuresis. The patient is feeling significantly better.  Her only complaint is itching and she thinks is due to Xarelto.  This has been a chronic complaint since she was started on anticoagulation.   Past Medical History:  Diagnosis Date  . (HFpEF) heart failure with preserved ejection fraction ([place])    a. 07/2017 Echo: EF 50-55%, no rwma, mild AS/AI, mod MR, mod dil LA, mod TR, PASP 86mmHg.  . Autoimmune hepatitis ([place])    followed by Dr [name]  . Fibrocystic breast disease   . Glaucoma   . Hypercholesterolemia   . Hypertension   . Hypothyroidism   . Inflammatory arthritis   . MI (myocardial infarction) ([place])   . Neuropathy   . Non-obstructive Coronary artery disease    a. 05/2014 NSTEMI/Cath: LM nl, LAD mild dzs, LCX 60ost hazy (? culprit), RCA mild dzs. EF 60% by echo-->Med Rx; b. 08/2017 Cath: LM 30ost, LAD min irregs, LCX small, 40ost/p, RCA large, min irregs, RPDA/RPL1 nl, EF 50-55%. 4+MR.  . Osteoarthritis   . Permanent atrial fibrillation ([facility])    a. CHA2DS2VASc = 6-->Xarelto.  . 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 Severe mitral regurgitation    a. 07/2017 Echo: Mod MR;  b. 08/2017 Cath: 4+/Severe MR.    Past Surgical History:  Procedure Laterality Date  . ABDOMINAL HYSTERECTOMY  1963   partial, secondary to fibroids  . APPENDECTOMY    . Billroth    . CARDIAC CATHETERIZATION  05/12/14   [facility]  . CARDIOVERSION N/A 12/26/2016   Procedure:  CARDIOVERSION;  Surgeon: [name], MD;  Location: [facility] ORS;  Service: Cardiovascular;  Laterality: N/A;  . CARDIOVERSION N/A 05/16/2017   Procedure: CARDIOVERSION;  Surgeon: [name], MD;  Location: [facility] ORS;  Service: Cardiovascular;  Laterality: N/A;  . CARDIOVERSION N/A 06/09/2017   Procedure: CARDIOVERSION;  Surgeon: [name], MD;  Location: [facility] ORS;  Service: Cardiovascular;  Laterality: N/A;  . CHOLECYSTECTOMY  1998  . COLONOSCOPY  2009  . RIGHT/LEFT HEART CATH AND CORONARY ANGIOGRAPHY Bilateral 08/21/2017   Procedure: RIGHT/LEFT HEART CATH AND CORONARY ANGIOGRAPHY;  Surgeon: [name], MD;  Location: [facility];  Service: Cardiovascular;  Laterality: Bilateral;  . TEE WITHOUT CARDIOVERSION N/A 08/25/2017   Procedure: TRANSESOPHAGEAL ECHOCARDIOGRAM (TEE);  Surgeon: Jolaine Artist, MD;  Location: [facility] ENDOSCOPY;  Service: Cardiovascular;  Laterality: N/A;  . UPPER GI ENDOSCOPY  2004     Current Outpatient Medications  Medication Sig Dispense Refill  . brimonidine-timolol (COMBIGAN) 0.2-0.5 % ophthalmic solution Place 1 drop into the left eye 2 (two) times daily.     . dorzolamide (TRUSOPT) 2 % ophthalmic solution Place 1 drop into the left eye 2 (two) times daily.    . fluticasone (FLONASE) 50 MCG/ACT nasal spray Place 2 sprays into both nostrils daily as needed for allergies.     Marland Kitchen gabapentin (NEURONTIN) 300 MG capsule TAKE 2 CAPSULES BY MOUTH FOUR TIMES DAILY 720 capsule 0  . ipratropium (ATROVENT) 0.06 % nasal spray Place 2 sprays into both nostrils daily as needed for rhinitis.     Marland Kitchen latanoprost (XALATAN) 0.005 % ophthalmic solution Place 1 drop into both eyes at bedtime.     Marland Kitchen levothyroxine (SYNTHROID, LEVOTHROID) 112 MCG tablet TAKE 1 TABLET BY MOUTH EVERY DAY BEFORE BREAKFAST 90 tablet 0  . pantoprazole (PROTONIX) 40 MG tablet Take 1 tablet (40 mg total) by mouth daily. (Patient taking differently: Take 40 mg by mouth every evening. ) 90  tablet 1  . potassium chloride (K-DUR,KLOR-CON) 20 MEQ tablet Take 1 tablet (20 mEq total) by mouth daily. 30 tablet 6  . ranitidine (ZANTAC) 150 MG tablet Take 1 tablet (150 mg total) by mouth daily. Take one tablet 30 minutes before breakfast 30 tablet 2  . Rivaroxaban (XARELTO) 15 MG TABS tablet Take 1 tablet (15 mg total) by mouth daily with supper. 30 tablet 6  . torsemide (DEMADEX) 20 MG tablet Take 1 tablet (20 mg total) by mouth daily. Can take extra 20 mg for weight gain of 3 lbs overnight or 5 lbs within one week. 30 tablet 6  . traMADol (ULTRAM) 50 MG tablet Take 1 tablet (50 mg total) by mouth 2 (two) times daily as needed (for pain.). 60 tablet 1  . triamcinolone cream (KENALOG) 0.1 % Apply 1 application topically 2 (two) times daily. 60 g 0  . ursodiol (ACTIGALL) 300 MG capsule Take 300-600 mg by mouth See admin instructions. Take 2 capsules (600 mg) daily after lunch & 1 capsule (300 mg) at night.    . metoprolol tartrate (LOPRESSOR) 100 MG tablet Take 1 tablet (100 mg total) by mouth 2 (two) times daily. 180 tablet 3   No current  facility-administered medications for this visit.     Allergies:   Statins; Haldol [haloperidol lactate]; Librax [chlordiazepoxide-clidinium]; Plavix [clopidogrel bisulfate]; and Ramipril    Social History:  The patient  reports that she has never smoked. She has never used smokeless tobacco. She reports that she does not drink alcohol or use drugs.   Family History:  The patient's family history includes Cancer in her daughter; Esophageal cancer in her brother; Ovarian cancer in her daughter; Stroke in her mother.    ROS:  Please see the history of present illness.   Otherwise, review of systems are positive for none.   All other systems are reviewed and negative.    PHYSICAL EXAM: VS:  BP (!) 112/56 (BP Location: Left Arm, Patient Position: Sitting, Cuff Size: Normal)   Pulse 97   Ht 5\' 2"  (1.575 m)   Wt 127 lb 4 oz (57.7 kg)   BMI 23.27 kg/m   , BMI Body mass index is 23.27 kg/m. GEN: Well nourished, well developed, in no acute distress  HEENT: normal  Neck: Mild JVD, carotid bruits, or masses Cardiac: Irregularly irregular; no rubs, or gallops,no edema . 1/ 6 systolic ejection murmur at the aortic area.  Moderate to severe bilateral leg edema Respiratory:  clear to auscultation bilaterally, normal work of breathing GI: soft, nontender, nondistended, + BS MS: no deformity or atrophy  Skin: warm and dry, no rash Neuro:  Strength and sensation are intact Psych: euthymic mood, full affect   EKG:  EKG is ordered today. The ekg ordered today demonstrates atrial fibrillation with ventricular rate of 95 bpm with nonspecific ST and T wave changes.  Recent Labs: 08/21/2017: ALT 98; B Natriuretic Peptide 1,241.1; TSH 1.888 08/27/2017: Magnesium 2.2 09/05/2017: BUN 55; Creatinine, Ser 1.78; Hemoglobin 10.0; Platelets 323; Potassium 4.4; Sodium 139    Lipid Panel    Component Value Date/Time   CHOL 159 05/27/2017 0959   TRIG 101.0 05/27/2017 0959   HDL 64.30 05/27/2017 0959   CHOLHDL 2 05/27/2017 0959   VLDL 20.2 05/27/2017 0959   LDLCALC 74 05/27/2017 0959   LDLDIRECT 143.5 12/17/2012 0955      Wt Readings from Last 3 Encounters:  10/03/17 127 lb 4 oz (57.7 kg)  10/02/17 130 lb (59 kg)  09/18/17 125 lb 12.8 oz (57.1 kg)       ASSESSMENT AND PLAN:  1.  Persistent atrial fibrillation:   Ventricular rate is reasonably controlled on metoprolol.  She is tolerating anticoagulation with Xarelto.  2. Chronic diastolic heart failure: She appears to be euvolemic on current dose of torsemide.  3. Essential hypertension: Blood pressure is controlled on metoprolol.  4.  Functional mitral regurgitation: Severe MR was only in the setting of severe volume overload.  MR was only mild on most recent transesophageal echo.   Disposition:   FU with me in 3 months.  Signed,  [name], MD  10/03/2017 2:24 PM    [place]

## 2017-10-05 ENCOUNTER — Encounter: Payer: Self-pay | Admitting: Internal Medicine

## 2017-10-05 DIAGNOSIS — L299 Pruritus, unspecified: Secondary | ICD-10-CM | POA: Insufficient documentation

## 2017-10-05 NOTE — Assessment & Plan Note (Signed)
Followed by cardiology.  Weight up a couple of pounds today on her check.  Has been stable.  Hold on making changes.  Continue current medication regimen.  Due to see Dr Fletcher Anon tomorrow.  Will weight tomorrow.  Monitor for change.  Discussed decreased salt intake.

## 2017-10-05 NOTE — Assessment & Plan Note (Signed)
Followed by GI.  Follow liver function tests.  

## 2017-10-05 NOTE — Assessment & Plan Note (Signed)
Improved after being diureses.  Followed by cardiology.

## 2017-10-05 NOTE — Assessment & Plan Note (Signed)
Controlled on current regimen.   

## 2017-10-05 NOTE — Assessment & Plan Note (Signed)
Persistent itching.  Small scattered lesions.  No significant rash.  Unclear etiology. No new exposures or contacts.  Had issues since starting blood thinner.  Discussed starting low dose allegra.  She will d/w Dr Fletcher Anon.  Also refer to dermatology.

## 2017-10-05 NOTE — Assessment & Plan Note (Signed)
Follow cbc.  

## 2017-10-05 NOTE — Assessment & Plan Note (Signed)
Stable.  Followed by cardiology.   

## 2017-10-05 NOTE — Assessment & Plan Note (Signed)
S/p cardioversion.  Still with intermittent increased heart rate as outlined.  Due to see Dr Fletcher Anon tomorrow.  Plans to discuss with him.

## 2017-10-05 NOTE — Assessment & Plan Note (Signed)
Blood pressure under good control.  Continue same medication regimen.  Follow pressures.  Follow metabolic panel.   

## 2017-10-05 NOTE — Assessment & Plan Note (Signed)
On thyroid replacement.  Follow tsh.  

## 2017-10-05 NOTE — Assessment & Plan Note (Signed)
Unable to take statin medication secondary to her underlying liver issues.  Follow lipid panel.

## 2017-10-07 ENCOUNTER — Telehealth: Payer: Self-pay | Admitting: Internal Medicine

## 2017-10-07 DIAGNOSIS — I2510 Atherosclerotic heart disease of native coronary artery without angina pectoris: Secondary | ICD-10-CM | POA: Diagnosis not present

## 2017-10-07 DIAGNOSIS — I272 Pulmonary hypertension, unspecified: Secondary | ICD-10-CM | POA: Diagnosis not present

## 2017-10-07 DIAGNOSIS — I340 Nonrheumatic mitral (valve) insufficiency: Secondary | ICD-10-CM | POA: Diagnosis not present

## 2017-10-07 DIAGNOSIS — I481 Persistent atrial fibrillation: Secondary | ICD-10-CM | POA: Diagnosis not present

## 2017-10-07 DIAGNOSIS — I5033 Acute on chronic diastolic (congestive) heart failure: Secondary | ICD-10-CM | POA: Diagnosis not present

## 2017-10-07 DIAGNOSIS — I110 Hypertensive heart disease with heart failure: Secondary | ICD-10-CM | POA: Diagnosis not present

## 2017-10-07 NOTE — Telephone Encounter (Signed)
Not sure that was scabies.  Had no burrows between fingers, etc.  Appreciate there input.  I spoke to [facility] and she is going to see if we can get her in asap to confirm diagnosis.  Pt informed me that she does not have the lesions until she starts scratching.

## 2017-10-07 NOTE — Telephone Encounter (Signed)
Patient says the bumps do not come up until she starts scratching, nurse says the bumps look like scabies, that she is concerned might be scabies.. Few placed on bilateral arms and a few on back, patient has been taking benadryl has not been helping. Nurse has not advised patient of her concerns no pets in home, and home is clean .  Patient advised nurse the places itch under her skin and then when she scratches these bumps come up[ .

## 2017-10-07 NOTE — Telephone Encounter (Signed)
Copied from [place]. Topic: General - Other >> Oct 07, 2017 12:45 PM Keene Breath wrote: Reason for CRM: Estill Bamberg with [place] [facility] called to inform doctor that she saw patient and examined the rash that she has, and is concerned that it might be scabies.  Would like to talk with Dr. Nicki Reaper to determine the next course of action and to see if the doctor agrees.  CB# [phone].

## 2017-10-07 NOTE — Telephone Encounter (Signed)
Patient states that she is doing fine. She did hit her finger against another finger and it started bleeding again but she rewrapped it. She states that she is on a blood thinner and that she would like to go somewhere local for dermatology . I informed Ruth Roth if the bleeding get worse or she starts to feel bad then to give the office a call patient verbalized understanding.

## 2017-10-07 NOTE — Telephone Encounter (Signed)
Please call and confirm pt doing ok where cut herself.  She is on blood thinner.  Make sure no further bleeding.  If any acute issues, recommend acute care this pm.  Per note, she is unable to get to appt with dermatology at [facility].  Will need to forward message back to Melissa to schedule first available here in town.

## 2017-10-07 NOTE — Telephone Encounter (Signed)
Scheduled pt to see [facility] in [place]. She said that she could not go to [place] bc her daughter in law will not be able to bring her. Also states that she cut her finger with a knife yesterday and it bled pink. She "wants to know what's wrong with it". She said it is not a deep cut and it did stop bleeding but she hit it again this morning and said that it started bleeding pink again. Pt cb [phone]

## 2017-10-08 NOTE — Telephone Encounter (Signed)
Reviewed note.  Pt unable to make appt at [facility].  Please just schedule first available appt here in town.  Thanks

## 2017-10-09 ENCOUNTER — Other Ambulatory Visit: Payer: Self-pay

## 2017-10-09 ENCOUNTER — Ambulatory Visit (INDEPENDENT_AMBULATORY_CARE_PROVIDER_SITE_OTHER): Payer: Medicare Other

## 2017-10-09 DIAGNOSIS — I5032 Chronic diastolic (congestive) heart failure: Secondary | ICD-10-CM

## 2017-10-10 DIAGNOSIS — I272 Pulmonary hypertension, unspecified: Secondary | ICD-10-CM | POA: Diagnosis not present

## 2017-10-10 DIAGNOSIS — I110 Hypertensive heart disease with heart failure: Secondary | ICD-10-CM | POA: Diagnosis not present

## 2017-10-10 DIAGNOSIS — I481 Persistent atrial fibrillation: Secondary | ICD-10-CM | POA: Diagnosis not present

## 2017-10-10 DIAGNOSIS — I340 Nonrheumatic mitral (valve) insufficiency: Secondary | ICD-10-CM | POA: Diagnosis not present

## 2017-10-10 DIAGNOSIS — I5033 Acute on chronic diastolic (congestive) heart failure: Secondary | ICD-10-CM | POA: Diagnosis not present

## 2017-10-10 DIAGNOSIS — I2510 Atherosclerotic heart disease of native coronary artery without angina pectoris: Secondary | ICD-10-CM | POA: Diagnosis not present

## 2017-10-14 ENCOUNTER — Other Ambulatory Visit: Payer: Self-pay | Admitting: *Deleted

## 2017-10-14 MED ORDER — PANTOPRAZOLE SODIUM 40 MG PO TBEC: 40.0000 mg | DELAYED_RELEASE_TABLET | Freq: Every evening | ORAL | 3 refills | Status: DC

## 2017-10-17 ENCOUNTER — Telehealth: Payer: Self-pay | Admitting: Cardiovascular Disease

## 2017-10-17 NOTE — Telephone Encounter (Signed)
Pt c/o of Chest Pain: STAT if CP now or developed within 24 hours  1. Are you having CP right now? Yes started this morning   2. Are you experiencing any other symptoms (ex. SOB, nausea, vomiting, sweating)? L side chest to rib cage feels like gas pocket and doesn't know what to do for it   3. How long have you been experiencing CP? This morning   4. Is your CP continuous or coming and going? continuous   5. Have you taken Nitroglycerin? No , tums not much relief  ?

## 2017-10-17 NOTE — Telephone Encounter (Signed)
I spoke with the patient. She states she felt ok when she woke up this morning, but as the day has gone on she has developed symptoms of discomfort to the left rib cage that radiates over to the middle of her abdomen.  She was concerned this may be cardiac in nature. She reports symptoms are worse when she pushes on her abdomen or gets up and moves around. She has been belching with no relief.  She has taken TUMS with no relief.  She state she has not felt like this before. She states her BM's are normal. She ate butter beans and squash last night that she fixed. She usually tolerates these fine.   I have advised her that her symptoms do not sound cardiac in nature and have advised her to call her PCP for further recommendations.  The patient is agreeable and voices understanding.

## 2017-10-23 DIAGNOSIS — I110 Hypertensive heart disease with heart failure: Secondary | ICD-10-CM | POA: Diagnosis not present

## 2017-10-23 DIAGNOSIS — I272 Pulmonary hypertension, unspecified: Secondary | ICD-10-CM | POA: Diagnosis not present

## 2017-10-23 DIAGNOSIS — I340 Nonrheumatic mitral (valve) insufficiency: Secondary | ICD-10-CM | POA: Diagnosis not present

## 2017-10-23 DIAGNOSIS — I481 Persistent atrial fibrillation: Secondary | ICD-10-CM | POA: Diagnosis not present

## 2017-10-23 DIAGNOSIS — I5033 Acute on chronic diastolic (congestive) heart failure: Secondary | ICD-10-CM | POA: Diagnosis not present

## 2017-10-23 DIAGNOSIS — I2510 Atherosclerotic heart disease of native coronary artery without angina pectoris: Secondary | ICD-10-CM | POA: Diagnosis not present

## 2017-11-06 ENCOUNTER — Ambulatory Visit (HOSPITAL_COMMUNITY)
Admission: RE | Admit: 2017-11-06 | Discharge: 2017-11-06 | Disposition: A | Discharge status: Home / Self Care | Payer: Medicare Other | Source: Ambulatory Visit | Attending: Internal Medicine | Admitting: Internal Medicine

## 2017-11-06 VITALS — BP 100/58 | HR 88 | Wt 127.6 lb

## 2017-11-06 DIAGNOSIS — I340 Nonrheumatic mitral (valve) insufficiency: Secondary | ICD-10-CM | POA: Diagnosis not present

## 2017-11-06 DIAGNOSIS — I4892 Unspecified atrial flutter: Secondary | ICD-10-CM | POA: Diagnosis not present

## 2017-11-06 DIAGNOSIS — Z801 Family history of malignant neoplasm of trachea, bronchus and lung: Secondary | ICD-10-CM | POA: Insufficient documentation

## 2017-11-06 DIAGNOSIS — Z79890 Hormone replacement therapy (postmenopausal): Secondary | ICD-10-CM | POA: Diagnosis not present

## 2017-11-06 DIAGNOSIS — Z823 Family history of stroke: Secondary | ICD-10-CM | POA: Insufficient documentation

## 2017-11-06 DIAGNOSIS — E7800 Pure hypercholesterolemia, unspecified: Secondary | ICD-10-CM | POA: Insufficient documentation

## 2017-11-06 DIAGNOSIS — Z800 Family history of malignant neoplasm of digestive organs: Secondary | ICD-10-CM | POA: Diagnosis not present

## 2017-11-06 DIAGNOSIS — Z888 Allergy status to other drugs, medicaments and biological substances status: Secondary | ICD-10-CM | POA: Insufficient documentation

## 2017-11-06 DIAGNOSIS — Z8711 Personal history of peptic ulcer disease: Secondary | ICD-10-CM | POA: Insufficient documentation

## 2017-11-06 DIAGNOSIS — R21 Rash and other nonspecific skin eruption: Secondary | ICD-10-CM | POA: Diagnosis not present

## 2017-11-06 DIAGNOSIS — Z7901 Long term (current) use of anticoagulants: Secondary | ICD-10-CM | POA: Diagnosis not present

## 2017-11-06 DIAGNOSIS — E039 Hypothyroidism, unspecified: Secondary | ICD-10-CM | POA: Diagnosis not present

## 2017-11-06 DIAGNOSIS — I5033 Acute on chronic diastolic (congestive) heart failure: Secondary | ICD-10-CM | POA: Diagnosis not present

## 2017-11-06 DIAGNOSIS — I481 Persistent atrial fibrillation: Secondary | ICD-10-CM | POA: Diagnosis not present

## 2017-11-06 DIAGNOSIS — M1990 Unspecified osteoarthritis, unspecified site: Secondary | ICD-10-CM | POA: Diagnosis not present

## 2017-11-06 DIAGNOSIS — I110 Hypertensive heart disease with heart failure: Secondary | ICD-10-CM | POA: Insufficient documentation

## 2017-11-06 DIAGNOSIS — I5032 Chronic diastolic (congestive) heart failure: Secondary | ICD-10-CM | POA: Diagnosis not present

## 2017-11-06 DIAGNOSIS — I2510 Atherosclerotic heart disease of native coronary artery without angina pectoris: Secondary | ICD-10-CM | POA: Diagnosis not present

## 2017-11-06 DIAGNOSIS — Z79899 Other long term (current) drug therapy: Secondary | ICD-10-CM | POA: Insufficient documentation

## 2017-11-06 DIAGNOSIS — N179 Acute kidney failure, unspecified: Secondary | ICD-10-CM | POA: Diagnosis not present

## 2017-11-06 DIAGNOSIS — G629 Polyneuropathy, unspecified: Secondary | ICD-10-CM | POA: Insufficient documentation

## 2017-11-06 DIAGNOSIS — H409 Unspecified glaucoma: Secondary | ICD-10-CM | POA: Insufficient documentation

## 2017-11-06 DIAGNOSIS — I252 Old myocardial infarction: Secondary | ICD-10-CM | POA: Insufficient documentation

## 2017-11-06 DIAGNOSIS — I4819 Other persistent atrial fibrillation: Secondary | ICD-10-CM

## 2017-11-06 DIAGNOSIS — Z8041 Family history of malignant neoplasm of ovary: Secondary | ICD-10-CM | POA: Insufficient documentation

## 2017-11-06 LAB — BASIC METABOLIC PANEL
ANION GAP: 10 (ref 5–15)
BUN: 43 mg/dL — ABNORMAL HIGH (ref 8–23)
CALCIUM: 8.6 mg/dL — AB (ref 8.9–10.3)
CO2: 26 mmol/L (ref 22–32)
CREATININE: 1.32 mg/dL — AB (ref 0.44–1.00)
Chloride: 102 mmol/L (ref 98–111)
GFR calc Af Amer: 41 mL/min — ABNORMAL LOW (ref >60)
GFR, EST NON AFRICAN AMERICAN: 35 mL/min — AB (ref >60)
Glucose, Bld: 95 mg/dL (ref 70–99)
Potassium: 4.8 mmol/L (ref 3.5–5.1)
Sodium: 138 mmol/L (ref 135–145)

## 2017-11-06 LAB — BRAIN NATRIURETIC PEPTIDE: B Natriuretic Peptide: 1241.3 pg/mL — ABNORMAL HIGH (ref 0.0–100.0)

## 2017-11-06 NOTE — Patient Instructions (Signed)
Labs today  You have been referred to Dr Rayann Heman at [facility] to discuss possible ablation, his office will call you for an appointment, they are located at:  [address] (3rd floor) [place], [place] [address] [phone]  You have been approved to start Cardiac Rehab  Your physician recommends that you schedule a follow-up appointment in: 3-4 months

## 2017-11-06 NOTE — Progress Notes (Signed)
Advanced Heart Failure Clinic Note   PCP: Einar Pheasant, MD PCP-Cardiologist: [name], MD   HPI: Ruth Roth is a 84 y.o. female with a history of 84 year old female with a history of coronary artery disease, chronic diastolic congestive heart failure, persistent atrial fibrillation, hypertension, peptic ulcer disease, fibrocystic breast disease, inflammatory arthritis, autoimmune hepatitis, and neuropathy.  Admitted to [facility] 08/21/17 s/p R/LHC which showed significantly elevated filling pressures, severe MR, and low cardiac output, and then transferred to [facility] 6/14 for consideration of MitraClip.  AKI noted on admission felt to be due to cardiorenal syndrome. She required milrinone and lasix for diuresis. Diuresed 25 lbs and transitioned to 20 mg torsemide daily. Case and images reviewed at length with structural heart team. MR felt to be truly functional with improvement from hemodynamic optimization. Not felt to be MitraClip candidate at this time. Discharge weight: 121 lbs.   She presents today for regular follow up. She had a long discussion with Dr Haroldine Laws last visit regarding her plan of care and decision was made to continue current therapy + referral to cardiac rehab. She feels much better today than at last visit. She is still not where she wants to be. She is SOB with ADLs at times. She is able to walk around the house and do chores, but this is limited by fatigue. Not very active. Has palpitations, but less often now. No edema, orthopnea, or PND. No CP. Rare dizziness with quick movements. Denies bleeding on xarelto. Finished with [facility] PT/OT and RN 2 weeks ago. Has not started cardiac rehab. Taking all medications. She is worried that the torsemide is causing her to itch. Weights at home: 122 lbs. Limits fluid and salt intake.   Cardiac Studies: Echo 10/09/17: EF 55-60%, no AI, mild to mod MR, LA moderately dilated, RA mildly dilated, RV normal, PA peak pressure 52 mmHg  [facility]  08/21/2017 - Mild non-obstructive CAD RHC Procedural Findings: (Obtained from Scan 08/21/17 at 1030) Hemodynamics (mmHg) RA mean 17 RV 68/11 (19) PA 62/27 (44) PCWP 41  AO 147/67 (102) Cardiac Output (Fick) 2.45 Cardiac Index  (Fick) 1.46 PVR 1.23 WU  Echo 08/01/17 LVEF 50-55%, Mild AS, Mod MR, Mod LAE, Mild RAE, Mod TR, PA peak pressure 65 mm Hg.MR ++ calcified, and RV mild/moderately reduced function.  Review of systems complete and found to be negative unless listed in HPI.   Past Medical History:  Diagnosis Date  . (HFpEF) heart failure with preserved ejection fraction ([place])    a. 07/2017 Echo: EF 50-55%, no rwma, mild AS/AI, mod MR, mod dil LA, mod TR, PASP 80mmHg.  . Autoimmune hepatitis ([place])    followed by Dr [name]  . Fibrocystic breast disease   . Glaucoma   . Hypercholesterolemia   . Hypertension   . Hypothyroidism   . Inflammatory arthritis   . MI (myocardial infarction) ([place])   . Neuropathy   . Non-obstructive Coronary artery disease    a. 05/2014 NSTEMI/Cath: LM nl, LAD mild dzs, LCX 60ost hazy (? culprit), RCA mild dzs. EF 60% by echo-->Med Rx; b. 08/2017 Cath: LM 30ost, LAD min irregs, LCX small, 40ost/p, RCA large, min irregs, RPDA/RPL1 nl, EF 50-55%. 4+MR.  . Osteoarthritis   . Permanent atrial fibrillation (HCC)    a. CHA2DS2VASc = 6-->Xarelto.  . 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 Severe mitral regurgitation    a. 07/2017 Echo: Mod MR; b. 08/2017 Cath: 4+/Severe MR.  Current Outpatient Medications  Medication Sig Dispense Refill  . brimonidine-timolol (COMBIGAN) 0.2-0.5 % ophthalmic solution Place 1 drop into the left eye 2 (two) times daily.     . dorzolamide (TRUSOPT) 2 % ophthalmic solution Place 1 drop into the left eye 2 (two) times daily.    . fluticasone (FLONASE) 50 MCG/ACT nasal spray Place 2 sprays into both nostrils daily as needed for allergies.     Marland Kitchen gabapentin (NEURONTIN) 300 MG  capsule TAKE 2 CAPSULES BY MOUTH FOUR TIMES DAILY 720 capsule 0  . ipratropium (ATROVENT) 0.06 % nasal spray Place 2 sprays into both nostrils daily as needed for rhinitis.     Marland Kitchen latanoprost (XALATAN) 0.005 % ophthalmic solution Place 1 drop into both eyes at bedtime.     Marland Kitchen levothyroxine (SYNTHROID, LEVOTHROID) 112 MCG tablet TAKE 1 TABLET BY MOUTH EVERY DAY BEFORE BREAKFAST 90 tablet 0  . metoprolol tartrate (LOPRESSOR) 100 MG tablet Take 1 tablet (100 mg total) by mouth 2 (two) times daily. 180 tablet 3  . pantoprazole (PROTONIX) 40 MG tablet Take 1 tablet (40 mg total) by mouth every evening. 90 tablet 3  . potassium chloride (K-DUR,KLOR-CON) 20 MEQ tablet Take 1 tablet (20 mEq total) by mouth daily. 30 tablet 6  . ranitidine (ZANTAC) 150 MG tablet Take 1 tablet (150 mg total) by mouth daily. Take one tablet 30 minutes before breakfast 30 tablet 2  . Rivaroxaban (XARELTO) 15 MG TABS tablet Take 1 tablet (15 mg total) by mouth daily with supper. 30 tablet 6  . torsemide (DEMADEX) 20 MG tablet Take 1 tablet (20 mg total) by mouth daily. Can take extra 20 mg for weight gain of 3 lbs overnight or 5 lbs within one week. 30 tablet 6  . traMADol (ULTRAM) 50 MG tablet Take 1 tablet (50 mg total) by mouth 2 (two) times daily as needed (for pain.). 60 tablet 1  . triamcinolone cream (KENALOG) 0.1 % Apply 1 application topically 2 (two) times daily. 60 g 0  . ursodiol (ACTIGALL) 300 MG capsule Take 300-600 mg by mouth See admin instructions. Take 2 capsules (600 mg) daily after lunch & 1 capsule (300 mg) at night.     No current facility-administered medications for this encounter.     Allergies  Allergen Reactions  . Statins Other (See Comments)    Affect her liver, Caused liver problems   . Haldol [Haloperidol Lactate] Rash  . Librax [Chlordiazepoxide-Clidinium] Rash  . Plavix [Clopidogrel Bisulfate] Rash  . Ramipril Rash      Social History   Socioeconomic History  . Marital status:  Widowed    Spouse name: Not on file  . Number of children: Not on file  . Years of education: Not on file  . Highest education level: Not on file  Occupational History  . Not on file  Social Needs  . Financial resource strain: Not on file  . Food insecurity:    Worry: Not on file    Inability: Not on file  . Transportation needs:    Medical: Not on file    Non-medical: Not on file  Tobacco Use  . Smoking status: Never Smoker  . Smokeless tobacco: Never Used  Substance and Sexual Activity  . Alcohol use: No    Alcohol/week: 0.0 standard drinks  . Drug use: No  . Sexual activity: Never  Lifestyle  . Physical activity:    Days per week: Not on file    Minutes per session: Not on  file  . Stress: Not on file  Relationships  . Social connections:    Talks on phone: Not on file    Gets together: Not on file    Attends religious service: Not on file    Active member of club or organization: Not on file    Attends meetings of clubs or organizations: Not on file    Relationship status: Not on file  . Intimate partner violence:    Fear of current or ex partner: Not on file    Emotionally abused: Not on file    Physically abused: Not on file    Forced sexual activity: Not on file  Other Topics Concern  . Not on file  Social History Narrative  . Not on file      Family History  Problem Relation Age of Onset  . Stroke Mother   . Esophageal cancer Brother        also had lung cancer  . Cancer Daughter        colon  . Ovarian cancer Daughter   . Breast cancer Neg Hx   . Colon cancer Neg Hx     Vitals:   11/06/17 1132  BP: (!) 100/58  Pulse: 88  Weight: 57.9 kg (127 lb 9.6 oz)   Wt Readings from Last 3 Encounters:  11/06/17 57.9 kg (127 lb 9.6 oz)  10/03/17 57.7 kg (127 lb 4 oz)  10/02/17 59 kg (130 lb)    PHYSICAL EXAM: General: Elderly appearing. No resp difficulty. HEENT: Normal Neck: Supple. JVP 8-9. Carotids 2+ bilat; no bruits. No thyromegaly or nodule  noted. Cor: PMI nondisplaced. IRR, soft MR murmur at apex. Small scabs on right breast Lungs: CTAB, normal effort. No wheeze Abdomen: Soft, non-tender, non-distended, no HSM. No bruits or masses. +BS  Extremities: no cyanosis, clubbing, rash, edema Neuro: alert & oriented x 3, cranial nerves grossly intact. moves all 4 extremities w/o difficulty. Affect pleasant   EKG: Atrial fib88 bpm. Personally reviewed.   ASSESSMENT & PLAN:  1.Acute on chronic diastolic congestive heart failure with low output: - Echo 08/01/17 LVEF 50-55%, Mild AS, Mod MR, Mod LAE, Mild RAE, Mod TR, RV moderately HK PA peak pressure 65 mm Hg. - Cath 08/21/17 with signficantly elevated filling pressures, severe MR, and low cardiac output - Had cardiorenal syndrome on admit with severe MR and RV failure and required milrinone - Echo 10/09/17: EF 55-60%, mild to mod MR - NYHA III-IIIb - Volume status stable.  - Continue torsemide 20 mg daily. Check BMET, BNP today - Continue Lopressor 100 mg BID for rate control. Low threshold to decrease - Encouraged her to go to cardiac rehab.   2. Severe mitral regurgitation: - Echo 08/01/17 LVEF 50-55%, Mild AS, Mod MR, Mod LAE, Mild RAE, Mod TR, PA peak pressure 65 mm Hg.MR ++ calcified, and RV mild/moderately reduced function. - TEE 6/17 showed only mild MR with restricted posterior leaflet. Dr Haroldine Laws reviewed with structural heart team.  - Per Dr Haroldine Laws --->felt this is truly functional MR and MR improved with hemodynamic optimization. Suspect we will not be able to keep her this way off milrinone. Will follow closely.  - Echo 10/09/17: EF 55-60%, mild to mod MR  3. Persistent atrial fibrillation: - Has previously failed amiodarone and DCCV x3.  - Rate controlled. She is in atrial flutter today - Continue Lopressor 100 mg BID for rate control. Low threshold to decrease - Continue Xarelto. Denies bleeding - Discussed possibility of afib  ablation vs AV node  ablation + PPM. She would like to do 12 weeks of cardiac rehab before going forward with either procedure. She would like to go ahead and see Dr Rayann Heman to discuss the procedures in further detail.   4. Essential hypertension: - Stable.   5. Recent AKI - Due to cardiorenal syndrome.  - BMET today  6. Rash - She has an itchy rash with pins and needles on her chest and right arm. Does not appear to be a drug rash. She is seeing dermatology next week.  - If dermatology thinks it is a drug rash, can switch her torsemide to bumex 1 mg daily. Instructed her to call and let us know.  Refer to Dr Rayann Heman Refer to cardiac rehab BMET BNP  Georgiana Shore, NP 11/06/17   Patient seen and examined with the above-signed Advanced Practice Provider and/or Housestaff. I personally reviewed laboratory data, imaging studies and relevant notes. I independently examined the patient and formulated the important aspects of the plan. I have edited the note to reflect any of my changes or salient points. I have personally discussed the plan with the patient and/or family.  Overall she is much improved since she was in the hospital but she states that she feels like she is only at 50% of her previous functional capacity when she is in AF. We discussed the difference between rhythm and rate control strategies. She has failed several attempts at [facility] even with amio. We talked about options of AF ablation as well as AVN ablation and biv pacing (thought rate is controlled). For now we will have her go to [facility] for 3 months and see if she can get some of her strength back. If not, will have her visit with Dr. Rayann Heman to discuss various strategies. I d/w Dr. Rayann Heman personally as well.   Doubt rash is drug rash but if torsemide felt to be the culprit can switch to bumex.  Total time persoanlly  spent 35 minutes. Over half that time spent discussing above.   Glori Bickers, MD  1:59 PM

## 2017-11-12 DIAGNOSIS — I4819 Other persistent atrial fibrillation: Secondary | ICD-10-CM

## 2017-11-12 DIAGNOSIS — I340 Nonrheumatic mitral (valve) insufficiency: Secondary | ICD-10-CM

## 2017-11-12 DIAGNOSIS — I272 Pulmonary hypertension, unspecified: Secondary | ICD-10-CM

## 2017-11-12 DIAGNOSIS — L299 Pruritus, unspecified: Secondary | ICD-10-CM | POA: Diagnosis not present

## 2017-11-12 DIAGNOSIS — I2510 Atherosclerotic heart disease of native coronary artery without angina pectoris: Secondary | ICD-10-CM | POA: Insufficient documentation

## 2017-11-12 DIAGNOSIS — G629 Polyneuropathy, unspecified: Secondary | ICD-10-CM | POA: Insufficient documentation

## 2017-11-20 DIAGNOSIS — H02402 Unspecified ptosis of left eyelid: Secondary | ICD-10-CM | POA: Diagnosis not present

## 2017-11-25 ENCOUNTER — Ambulatory Visit (INDEPENDENT_AMBULATORY_CARE_PROVIDER_SITE_OTHER): Payer: Medicare Other | Admitting: Family Medicine

## 2017-11-25 ENCOUNTER — Encounter: Payer: Medicare Other | Attending: Internal Medicine

## 2017-11-25 VITALS — BP 112/64 | HR 64 | Temp 97.6°F | Resp 16 | Ht 62.0 in | Wt 128.6 lb

## 2017-11-25 DIAGNOSIS — M542 Cervicalgia: Secondary | ICD-10-CM

## 2017-11-25 DIAGNOSIS — W19XXXA Unspecified fall, initial encounter: Secondary | ICD-10-CM | POA: Diagnosis not present

## 2017-11-25 DIAGNOSIS — G44319 Acute post-traumatic headache, not intractable: Secondary | ICD-10-CM | POA: Diagnosis not present

## 2017-11-25 DIAGNOSIS — S0003XA Contusion of scalp, initial encounter: Secondary | ICD-10-CM | POA: Diagnosis not present

## 2017-11-25 DIAGNOSIS — I5032 Chronic diastolic (congestive) heart failure: Secondary | ICD-10-CM

## 2017-11-25 NOTE — Progress Notes (Signed)
Subjective:    [name], female    DOB: [birth year], 84 y.o.   MRN: [id number]  HPI    Presents to clinic due to fall with c/o bruising on back of head. States about 1 week ago she became very dizzy while getting ready for bed.  Patient states the room began to spin very quickly she tried to get balance but holding on a dresser, but fell straight back and landed flat on back hitting back of head on floor.  Denies getting knocked out.   States she has had vertigo in the past, but this dizziness seemed different. Denies CP, SOB, Palpitations.   Patient does have bruising on back of head due to "goose egg" & down the back of neck.  She does take xarelto.   Patient is supposed to start pulmonary rehab, but has not been allowed.  States she needs clearance before she can start pulmonary rehab.  Patient Active Problem List   Diagnosis Date Noted  . Itching 10/05/2017  . Acute on chronic diastolic (congestive) heart failure ([place]) 08/21/2017  . Acute on chronic diastolic CHF (congestive heart failure) ([place]) 08/21/2017  . Acute diastolic CHF (congestive heart failure) ([place]) 08/21/2017  . Severe mitral regurgitation   . Pulmonary hypertension, unspecified ([place])   . Anemia 06/01/2017  . Persistent atrial fibrillation ([place])   . Dizziness 08/09/2016  . Back pain 05/19/2016  . Abnormal CXR 10/02/2014  . Chronic diastolic heart failure ([place]) 05/24/2014  . Coronary artery disease   . Dyspnea 05/09/2014  . Renal insufficiency 05/06/2014  . Palpitations 04/17/2014  . Health care maintenance 04/17/2014  . Unspecified gastritis and gastroduodenitis without mention of hemorrhage 11/04/2012  . GERD (gastroesophageal reflux disease) 10/18/2012  . Autoimmune hepatitis ([place]) 01/20/2012  . Osteoporosis 01/20/2012  . Hypertension 01/16/2012  . Hypercholesterolemia 01/16/2012  . Hypothyroidism 04/30/2011  . Thyroiditis, lymphocytic 04/30/2011   Social History   Tobacco Use  .  Smoking status: Never Smoker  . Smokeless tobacco: Never Used  Substance Use Topics  . Alcohol use: No    Alcohol/week: 0.0 standard drinks   Review of Systems  Constitutional: Negative for chills, fatigue and fever.  HENT: Negative for congestion, ear pain, sinus pain and sore throat.   Eyes: Negative.   Respiratory: Negative for cough, shortness of breath and wheezing.   Cardiovascular: Negative for chest pain, palpitations and leg swelling.  Gastrointestinal: Negative for abdominal pain, diarrhea, nausea and vomiting.  Genitourinary: Negative for dysuria, frequency and urgency.  Musculoskeletal: Negative for arthralgias and myalgias.  Skin: Negative for color change, pallor and rash.  Neurological:+dizziness, fall 1 week ago Psychiatric/Behavioral: The patient is not nervous/anxious.       Objective:   Physical Exam  Constitutional: She is oriented to person, place, and time. She appears well-nourished. No distress.  HENT:  Head: Normocephalic and atraumatic.  Right Ear: External ear normal.  Left Ear: External ear normal.  Mouth/Throat: Oropharynx is clear and moist.  Eyes: Pupils are equal, round, and reactive to light. EOM are normal. Right eye exhibits no discharge. Left eye exhibits no discharge. No scleral icterus.  Neck: Neck supple. No JVD present. No tracheal deviation present.  Cardiovascular: Normal rate and normal heart sounds.  Pulmonary/Chest: Effort normal. No respiratory distress. She has no wheezes. She has no rales.  Musculoskeletal: She exhibits tenderness. She exhibits no edema.  Scalp hematoma on back of head about size of gumball (patient and daughter say it was much larger  before) is tender. Also some bruising down back of neck, some tenderness of neck but ROM of c-spine intact.   Neurological: She is alert and oriented to person, place, and time. No cranial nerve deficit.  Speech clear. Smile symmetrical. Can raise eye brows and puff out cheeks. Gait  normal.   Skin: Skin is warm and dry. She is not diaphoretic. No pallor.  Nursing note and vitals reviewed.     Vitals:   11/25/17 1429  BP: 112/64  Pulse: 64  Resp: 16  Temp: 97.6 F (36.4 C)  SpO2: 94%    Assessment & Plan:    Dizziness/fall --patient is neurologically intact in clinic today.  Unclear reason for dizziness/fall.  Could have been related to vertigo.  We will get CT scan of brain to rule out any intracranial bleeding.  Neck pain -- related to fall. ROM neck intact. We will get neck xray as well as head CT.  Scalp hematoma- hematoma is slowly resolving on own -  Bruising is dark purple/yellowing in color.   Once we have CT scan results, we will be able to appropriately clear patient for participation in pulmonary rehab.  Patient is neurologically intact, so I do not suspect any acute intracranial abnormality.  Patient will keep regularly scheduled follow-up as planned.  Advised to return to clinic sooner if any issues arise.

## 2017-11-25 NOTE — Progress Notes (Signed)
Incomplete Session Note  Patient Details  Name: Ruth Roth MRN: [id number] Date of Birth: 07/03/1928 Referring Provider:    [name] did not complete her rehab session. Pt reported that she fell last Tuesday.  She still has a knot on her head.. She has been having dizzy symptoms for a while and has spoken with her Dr.  She refused to go to ER.  Staff called her PCP office and she sees a NP there this afternoon.

## 2017-11-26 ENCOUNTER — Encounter: Payer: Self-pay | Admitting: Family Medicine

## 2017-11-27 ENCOUNTER — Ambulatory Visit (INDEPENDENT_AMBULATORY_CARE_PROVIDER_SITE_OTHER): Payer: Medicare Other | Admitting: Internal Medicine

## 2017-11-27 ENCOUNTER — Ambulatory Visit (INDEPENDENT_AMBULATORY_CARE_PROVIDER_SITE_OTHER): Payer: Medicare Other

## 2017-11-27 ENCOUNTER — Telehealth: Payer: Self-pay | Admitting: Internal Medicine

## 2017-11-27 ENCOUNTER — Ambulatory Visit
Admission: RE | Admit: 2017-11-27 | Discharge: 2017-11-27 | Disposition: A | Discharge status: Home / Self Care | Payer: Medicare Other | Source: Ambulatory Visit | Attending: Internal Medicine | Admitting: Internal Medicine

## 2017-11-27 DIAGNOSIS — M542 Cervicalgia: Secondary | ICD-10-CM

## 2017-11-27 DIAGNOSIS — I10 Essential (primary) hypertension: Secondary | ICD-10-CM | POA: Diagnosis not present

## 2017-11-27 DIAGNOSIS — I5032 Chronic diastolic (congestive) heart failure: Secondary | ICD-10-CM

## 2017-11-27 DIAGNOSIS — X58XXXA Exposure to other specified factors, initial encounter: Secondary | ICD-10-CM | POA: Diagnosis not present

## 2017-11-27 DIAGNOSIS — S199XXA Unspecified injury of neck, initial encounter: Secondary | ICD-10-CM | POA: Diagnosis not present

## 2017-11-27 DIAGNOSIS — G44309 Post-traumatic headache, unspecified, not intractable: Secondary | ICD-10-CM | POA: Insufficient documentation

## 2017-11-27 DIAGNOSIS — I2510 Atherosclerotic heart disease of native coronary artery without angina pectoris: Secondary | ICD-10-CM

## 2017-11-27 DIAGNOSIS — I4819 Other persistent atrial fibrillation: Secondary | ICD-10-CM

## 2017-11-27 DIAGNOSIS — I6782 Cerebral ischemia: Secondary | ICD-10-CM | POA: Diagnosis not present

## 2017-11-27 DIAGNOSIS — R42 Dizziness and giddiness: Secondary | ICD-10-CM | POA: Diagnosis not present

## 2017-11-27 DIAGNOSIS — S0003XA Contusion of scalp, initial encounter: Secondary | ICD-10-CM | POA: Diagnosis not present

## 2017-11-27 DIAGNOSIS — I481 Persistent atrial fibrillation: Secondary | ICD-10-CM | POA: Diagnosis not present

## 2017-11-27 MED ORDER — TRAMADOL HCL 50 MG PO TABS: 50.0000 mg | ORAL_TABLET | Freq: Two times a day (BID) | ORAL | 1 refills | Status: DC | PRN

## 2017-11-27 NOTE — Telephone Encounter (Signed)
Ben, CT Tech at [facility] called report of CT Head results, read back and verified in chart. He asked what to do with the patient, office is closed, I advised to send the patient home since the results are negative for acute findings.

## 2017-11-27 NOTE — Progress Notes (Signed)
[name], female   DOB: [birth year], 84 y.o.   MRN: [id number]   Subjective:    [name], female    DOB: 1928-06-22, 84 y.o.   MRN: [id number]  HPI  Patient here for a scheduled follow up.  She was seen 11/25/17 after experiencing a fall the week before.  She reports that she was standing in her bedroom and noticed room started to spin.  States it looked like the dresser was falling.  She then fell backwards.  Hit her posterior head.  No LOC.  The dizziness resolved.  Noticed hematoma posterior head.  Has a remote history of some vertigo.  Was seen 11/25/17.  Note reviewed.  Waiting on scan.  Did not get neck xray.  Reports persistent pain - posterior head.  Pain with palpation.  No diffuse headache.  Has had a couple of episodes of light headedness.  Last approximately one hour ago.  Noticed when got out of car to go in [place].  No dizziness now.  No chest pain.  No increased heart rate or palpitations.  Does have afib.  On xarelto.  Eating.  No nausea or vomiting.  Still with some itching.  Has seen dermatology.     Past Medical History:  Diagnosis Date  . (HFpEF) heart failure with preserved ejection fraction ([place])    a. 07/2017 Echo: EF 50-55%, no rwma, mild AS/AI, mod MR, mod dil LA, mod TR, PASP 55mmHg.  . Autoimmune hepatitis ([place])    followed by Dr [name]  . Fibrocystic breast disease   . Glaucoma   . Hypercholesterolemia   . Hypertension   . Hypothyroidism   . Inflammatory arthritis   . MI (myocardial infarction) ([place])   . Neuropathy   . Non-obstructive Coronary artery disease    a. 05/2014 NSTEMI/Cath: LM nl, LAD mild dzs, LCX 60ost hazy (? culprit), RCA mild dzs. EF 60% by echo-->Med Rx; b. 08/2017 Cath: LM 30ost, LAD min irregs, LCX small, 40ost/p, RCA large, min irregs, RPDA/RPL1 nl, EF 50-55%. 4+MR.  . Osteoarthritis   . Permanent atrial fibrillation (HCC)    a. CHA2DS2VASc = 6-->Xarelto.  . 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 Severe mitral regurgitation    a. 07/2017 Echo: Mod MR; b. 08/2017 Cath: 4+/Severe MR.   Past Surgical History:  Procedure Laterality Date  . ABDOMINAL HYSTERECTOMY  1963   partial, secondary to fibroids  . APPENDECTOMY    . Billroth    . CARDIAC CATHETERIZATION  05/12/14   [facility]  . CARDIOVERSION N/A 12/26/2016   Procedure: CARDIOVERSION;  Surgeon: [name], MD;  Location: [facility] ORS;  Service: Cardiovascular;  Laterality: N/A;  . CARDIOVERSION N/A 05/16/2017   Procedure: CARDIOVERSION;  Surgeon: [name], MD;  Location: [facility] OR[facility];  Service: Cardiovascular;  Laterality: N/A;  . CARDIOVERSION N/A 06/09/2017   Procedure: CARDIOVERSION;  Surgeon: [name], MD;  Location: [facility];  Service: Cardiovascular;  Laterality: N/A;  . CHOLECYSTECTOMY  1998  . COLONOSCOPY  2009  . RIGHT/LEFT HEART CATH AND CORONARY ANGIOGRAPHY Bilateral 08/21/2017   Procedure: RIGHT/LEFT HEART CATH AND CORONARY ANGIOGRAPHY;  Surgeon: [name], MD;  Location: [place] [facility];  Service: Cardiovascular;  Laterality: Bilateral;  . TEE WITHOUT CARDIOVERSION N/A 08/25/2017   Procedure: TRANSESOPHAGEAL ECHOCARDIOGRAM (TEE);  Surgeon: Jolaine Artist, MD;  Location: [place];  Service: Cardiovascular;  Laterality: N/A;  .  UPPER GI ENDOSCOPY  2004   Family History  Problem Relation Age of Onset  . Stroke Mother   . Esophageal cancer Brother        also had lung cancer  . Cancer Daughter        colon  . Ovarian cancer Daughter   . Breast cancer Neg Hx   . Colon cancer Neg Hx    Social History   Socioeconomic History  . Marital status: Widowed    Spouse name: Not on file  . Number of children: Not on file  . Years of education: Not on file  . Highest education level: Not on file  Occupational History  . Not on file  Social Needs  . Financial resource strain: Not on file  . Food insecurity:    Worry: Not on file     Inability: Not on file  . Transportation needs:    Medical: Not on file    Non-medical: Not on file  Tobacco Use  . Smoking status: Never Smoker  . Smokeless tobacco: Never Used  Substance and Sexual Activity  . Alcohol use: No    Alcohol/week: 0.0 standard drinks  . Drug use: No  . Sexual activity: Never  Lifestyle  . Physical activity:    Days per week: Not on file    Minutes per session: Not on file  . Stress: Not on file  Relationships  . Social connections:    Talks on phone: Not on file    Gets together: Not on file    Attends religious service: Not on file    Active member of club or organization: Not on file    Attends meetings of clubs or organizations: Not on file    Relationship status: Not on file  Other Topics Concern  . Not on file  Social History Narrative  . Not on file    Outpatient Encounter Medications as of 11/27/2017  Medication Sig  . brimonidine-timolol (COMBIGAN) 0.2-0.5 % ophthalmic solution Place 1 drop into the left eye 2 (two) times daily.   . dorzolamide (TRUSOPT) 2 % ophthalmic solution Place 1 drop into the left eye 2 (two) times daily.  . fluticasone (FLONASE) 50 MCG/ACT nasal spray Place 2 sprays into both nostrils daily as needed for allergies.   Marland Kitchen gabapentin (NEURONTIN) 300 MG capsule TAKE 2 CAPSULES BY MOUTH FOUR TIMES DAILY  . ipratropium (ATROVENT) 0.06 % nasal spray Place 2 sprays into both nostrils daily as needed for rhinitis.   Marland Kitchen latanoprost (XALATAN) 0.005 % ophthalmic solution Place 1 drop into both eyes at bedtime.   Marland Kitchen levothyroxine (SYNTHROID, LEVOTHROID) 112 MCG tablet TAKE 1 TABLET BY MOUTH EVERY DAY BEFORE BREAKFAST  . metoprolol tartrate (LOPRESSOR) 100 MG tablet Take 1 tablet (100 mg total) by mouth 2 (two) times daily.  . pantoprazole (PROTONIX) 40 MG tablet Take 1 tablet (40 mg total) by mouth every evening.  . potassium chloride (K-DUR,KLOR-CON) 20 MEQ tablet Take 1 tablet (20 mEq total) by mouth daily.  . ranitidine  (ZANTAC) 150 MG tablet Take 1 tablet (150 mg total) by mouth daily. Take one tablet 30 minutes before breakfast  . Rivaroxaban (XARELTO) 15 MG TABS tablet Take 1 tablet (15 mg total) by mouth daily with supper.  . torsemide (DEMADEX) 20 MG tablet Take 1 tablet (20 mg total) by mouth daily. Can take extra 20 mg for weight gain of 3 lbs overnight or 5 lbs within one week.  . traMADol (ULTRAM) 50 MG  tablet Take 1 tablet (50 mg total) by mouth 2 (two) times daily as needed (for pain.).  Marland Kitchen triamcinolone cream (KENALOG) 0.1 % Apply 1 application topically 2 (two) times daily.  . ursodiol (ACTIGALL) 300 MG capsule Take 300-600 mg by mouth See admin instructions. Take 2 capsules (600 mg) daily after lunch & 1 capsule (300 mg) at night.  . [DISCONTINUED] traMADol (ULTRAM) 50 MG tablet Take 1 tablet (50 mg total) by mouth 2 (two) times daily as needed (for pain.).   No facility-administered encounter medications on file as of 11/27/2017.     Review of Systems  Constitutional: Negative for appetite change, fever and unexpected weight change.  HENT: Negative for congestion.   Respiratory: Negative for cough, chest tightness and shortness of breath.   Cardiovascular: Negative for chest pain, palpitations and leg swelling.  Gastrointestinal: Negative for abdominal pain, diarrhea, nausea and vomiting.  Musculoskeletal: Negative for joint swelling and myalgias.  Skin: Negative for rash.       Resolving hematoma with bruising - posterior head.   Neurological: Positive for dizziness and light-headedness.       Head pain as outlined.    Psychiatric/Behavioral: Negative for agitation and dysphoric mood.       Objective:    Physical Exam  Constitutional: She appears well-developed and well-nourished. No distress.  HENT:  Nose: Nose normal.  Mouth/Throat: Oropharynx is clear and moist.  Neck: Neck supple.  Cardiovascular: Normal rate.  Irregular rhythm.   Pulmonary/Chest: Breath sounds normal. No  respiratory distress. She has no wheezes.  Abdominal: Soft. Bowel sounds are normal. There is no tenderness.  Musculoskeletal: She exhibits no edema or tenderness.  Lymphadenopathy:    She has no cervical adenopathy.  Skin:  Resolving hematoma -  Posterior head.  Bruising - posterior head and neck.    Psychiatric: She has a normal mood and affect. Her behavior is normal.    BP 122/64 (BP Location: Left Arm, Patient Position: Sitting, Cuff Size: Normal)   Pulse 63   Temp (!) 97.3 F (36.3 C) (Oral)   Resp 18   Wt 128 lb 3.2 oz (58.2 kg)   SpO2 93%   BMI 23.45 kg/m  Wt Readings from Last 3 Encounters:  11/27/17 128 lb 3.2 oz (58.2 kg)  11/25/17 128 lb 9.6 oz (58.3 kg)  11/06/17 127 lb 9.6 oz (57.9 kg)     Lab Results  Component Value Date   WBC 8.9 09/05/2017   HGB 10.0 (L) 09/05/2017   HCT 34.1 (L) 09/05/2017   PLT 323 09/05/2017   GLUCOSE 95 11/06/2017   CHOL 159 05/27/2017   TRIG 101.0 05/27/2017   HDL 64.30 05/27/2017   LDLDIRECT 143.5 12/17/2012   LDLCALC 74 05/27/2017   ALT 98 (H) 08/21/2017   AST 52 (H) 08/21/2017   NA 138 11/06/2017   K 4.8 11/06/2017   CL 102 11/06/2017   CREATININE 1.32 (H) 11/06/2017   BUN 43 (H) 11/06/2017   CO2 26 11/06/2017   TSH 1.888 08/21/2017   INR 1.36 08/21/2017   HGBA1C 5.7 05/27/2017       Assessment & Plan:   Problem List Items Addressed This Visit    Chronic diastolic heart failure ([place])    Followed by cardiology.  Weight stable.  Continue current medication regimen.        Dizziness    Acute dizziness described as room spinning as outlined.  Has had a couple of episodes of light headedness since.  Resolving hematoma  with pain localized over the site.  Never had the CT.  Will obtain today.  Unclear etiology of her dizziness.  No chest pain.  Noticed no increased heart rate or palpitations.  Has known afib - rate controlled on current regimen.  Has remote history of vertigo.  Discussed ENT referral.  Obtain CT.  May  need MRI.  Need to confirm no other neurological etiology.        Hypertension    Blood pressure as outlined.  Not orthostatic on exam.  Blood pressure standing 114/68 and lying - 112-114/70.  Continue same medication regimen.        Persistent atrial fibrillation [employer])    Sees cardiology.  Rate controlled.         Other Visit Diagnoses    Post-traumatic headache, not intractable, unspecified chronicity pattern       Persistent pain and light headedness with acute onset dizziness originally.  Obtain CT head as outlined.  Discussed possible MRI.    Relevant Medications   traMADol (ULTRAM) 50 MG tablet   Other Relevant Orders   CT Head Wo Contrast (Completed)       Einar Pheasant, MD

## 2017-11-28 ENCOUNTER — Encounter: Payer: Self-pay | Admitting: Internal Medicine

## 2017-11-28 ENCOUNTER — Encounter: Payer: Self-pay | Admitting: Lab

## 2017-11-28 ENCOUNTER — Ambulatory Visit: Payer: Medicare Other

## 2017-11-28 ENCOUNTER — Encounter: Payer: Self-pay | Admitting: Family Medicine

## 2017-11-28 NOTE — Assessment & Plan Note (Signed)
Followed by cardiology.  Weight stable.  Continue current medication regimen.

## 2017-11-28 NOTE — Telephone Encounter (Signed)
Ct results

## 2017-11-28 NOTE — Progress Notes (Unsigned)
Note for pulmonary rehab.

## 2017-11-28 NOTE — Assessment & Plan Note (Signed)
Sees cardiology.  Rate controlled.

## 2017-11-28 NOTE — Telephone Encounter (Signed)
Pt notified.  See result note in chart.

## 2017-11-28 NOTE — Telephone Encounter (Signed)
This is the patient that we discussed this morning. Let me know if I need to do anything

## 2017-11-28 NOTE — Assessment & Plan Note (Signed)
Blood pressure as outlined.  Not orthostatic on exam.  Blood pressure standing 114/68 and lying - 112-114/70.  Continue same medication regimen.

## 2017-11-28 NOTE — Assessment & Plan Note (Signed)
Acute dizziness described as room spinning as outlined.  Has had a couple of episodes of light headedness since.  Resolving hematoma with pain localized over the site.  Never had the CT.  Will obtain today.  Unclear etiology of her dizziness.  No chest pain.  Noticed no increased heart rate or palpitations.  Has known afib - rate controlled on current regimen.  Has remote history of vertigo.  Discussed ENT referral.  Obtain CT.  May need MRI.  Need to confirm no other neurological etiology.

## 2017-12-01 ENCOUNTER — Ambulatory Visit (INDEPENDENT_AMBULATORY_CARE_PROVIDER_SITE_OTHER): Payer: Medicare Other | Admitting: Internal Medicine

## 2017-12-01 ENCOUNTER — Encounter: Payer: Self-pay | Admitting: Internal Medicine

## 2017-12-01 VITALS — BP 126/72 | HR 97 | Ht 62.0 in | Wt 126.2 lb

## 2017-12-01 DIAGNOSIS — I5032 Chronic diastolic (congestive) heart failure: Secondary | ICD-10-CM

## 2017-12-01 DIAGNOSIS — I4819 Other persistent atrial fibrillation: Secondary | ICD-10-CM

## 2017-12-01 DIAGNOSIS — I481 Persistent atrial fibrillation: Secondary | ICD-10-CM | POA: Diagnosis not present

## 2017-12-01 DIAGNOSIS — I340 Nonrheumatic mitral (valve) insufficiency: Secondary | ICD-10-CM

## 2017-12-01 NOTE — Progress Notes (Signed)
Electrophysiology Office Note   Date:  12/01/2017   ID:  Ruth Roth, DOB 1929-02-20, MRN [id number]  PCP:  Ruth Pheasant, MD  Cardiologist:  Dr Fletcher Anon  CHF: Dr Haroldine Laws Primary Electrophysiologist: Ruth Grayer, MD    CC: afib   History of Present Illness: Ruth Roth is a 84 y.o. female who presents today for electrophysiology evaluation.   She is referred by Dr Haroldine Laws for EP consultation regarding atrial arrhythmias.  She has persistent afib, diastolic dysfunction, CRI, severe MR, autoimmune hepatitis, and inflammatory arthritis.  She has a h/o severe MR and has been considered for mitraclip.  She is felt to not be a candidate for the procedure.  Her MR appears to have improved with diuresis. She has fatigue.  She is SOB with moderate activity.  She is able to walk around the house and do chores but this is limited by fatigue.  She has rare palpitations. She has failed amiodarone and has been cardioverted 3 times previously.  She is rate controlled with metoprolol.  She had planned to start cardiac rehab but had a fall 2 weeks ago and thus has deferred rehab.  She reports having abrupt vertiginous dizziness and fell.  She is very clear that she did not pass out.  She has been evaluated by Dr Ruth Roth and had head CT.  Today, she denies symptoms of palpitations, chest pain, orthopnea, PND, lower extremity edema, claudication,presyncope, syncope, bleeding, or neurologic sequela. The patient is tolerating medications without difficulties and is otherwise without complaint today.    Past Medical History:  Diagnosis Date  . (HFpEF) heart failure with preserved ejection fraction ([place])    a. 07/2017 Echo: EF 50-55%, no rwma, mild AS/AI, mod MR, mod dil LA, mod TR, PASP 16mmHg.  . Autoimmune hepatitis ([place])    followed by Dr Ruth Roth  . Fibrocystic breast disease   . Glaucoma   . Hypercholesterolemia   . Hypertension   . Hypothyroidism   . Inflammatory arthritis   . MI (myocardial  infarction) ([place])   . Neuropathy   . Non-obstructive Coronary artery disease    a. 05/2014 NSTEMI/Cath: LM nl, LAD mild dzs, LCX 60ost hazy (? culprit), RCA mild dzs. EF 60% by echo-->Med Rx; b. 08/2017 Cath: LM 30ost, LAD min irregs, LCX small, 40ost/p, RCA large, min irregs, RPDA/RPL1 nl, EF 50-55%. 4+MR.  . Osteoarthritis   . Permanent atrial fibrillation (HCC)    a. CHA2DS2VASc = 6-->Xarelto.  . 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 Severe mitral regurgitation    a. 07/2017 Echo: Mod MR; b. 08/2017 Cath: 4+/Severe MR.   Past Surgical History:  Procedure Laterality Date  . ABDOMINAL HYSTERECTOMY  1963   partial, secondary to fibroids  . APPENDECTOMY    . Billroth    . CARDIAC CATHETERIZATION  05/12/14   [facility]  . CARDIOVERSION N/A 12/26/2016   Procedure: CARDIOVERSION;  Surgeon: Ruth Hampshire, MD;  Location: [facility];  Service: Cardiovascular;  Laterality: N/A;  . CARDIOVERSION N/A 05/16/2017   Procedure: CARDIOVERSION;  Surgeon: Ruth Hampshire, MD;  Location: [facility];  Service: Cardiovascular;  Laterality: N/A;  . CARDIOVERSION N/A 06/09/2017   Procedure: CARDIOVERSION;  Surgeon: Ruth Hampshire, MD;  Location: [facility];  Service: Cardiovascular;  Laterality: N/A;  . CHOLECYSTECTOMY  1998  . COLONOSCOPY  2009  . RIGHT/LEFT HEART CATH AND CORONARY ANGIOGRAPHY Bilateral 08/21/2017   Procedure: RIGHT/LEFT HEART CATH AND CORONARY  ANGIOGRAPHY;  Surgeon: Ruth Hampshire, MD;  Location: [place] [facility];  Service: Cardiovascular;  Laterality: Bilateral;  . TEE WITHOUT CARDIOVERSION N/A 08/25/2017   Procedure: TRANSESOPHAGEAL ECHOCARDIOGRAM (TEE);  Surgeon: Jolaine Artist, MD;  [facility];  Service: Cardiovascular;  Laterality: N/A;  . UPPER GI ENDOSCOPY  2004     Current Outpatient Medications  Medication Sig Dispense Refill  . brimonidine-timolol (COMBIGAN) 0.2-0.5 % ophthalmic solution Place 1 drop into  the left eye 2 (two) times daily.     . dorzolamide (TRUSOPT) 2 % ophthalmic solution Place 1 drop into the left eye 2 (two) times daily.    . fluticasone (FLONASE) 50 MCG/ACT nasal spray Place 2 sprays into both nostrils daily as needed for allergies.     Marland Kitchen gabapentin (NEURONTIN) 300 MG capsule TAKE 2 CAPSULES BY MOUTH FOUR TIMES DAILY 720 capsule 0  . ipratropium (ATROVENT) 0.06 % nasal spray Place 2 sprays into both nostrils daily as needed for rhinitis.     Marland Kitchen latanoprost (XALATAN) 0.005 % ophthalmic solution Place 1 drop into both eyes at bedtime.     Marland Kitchen levothyroxine (SYNTHROID, LEVOTHROID) 112 MCG tablet TAKE 1 TABLET BY MOUTH EVERY DAY BEFORE BREAKFAST 90 tablet 0  . metoprolol tartrate (LOPRESSOR) 100 MG tablet Take 1 tablet (100 mg total) by mouth 2 (two) times daily. 180 tablet 3  . pantoprazole (PROTONIX) 40 MG tablet Take 1 tablet (40 mg total) by mouth every evening. 90 tablet 3  . potassium chloride (K-DUR,KLOR-CON) 20 MEQ tablet Take 1 tablet (20 mEq total) by mouth daily. 30 tablet 6  . ranitidine (ZANTAC) 150 MG tablet Take 1 tablet (150 mg total) by mouth daily. Take one tablet 30 minutes before breakfast 30 tablet 2  . Rivaroxaban (XARELTO) 15 MG TABS tablet Take 1 tablet (15 mg total) by mouth daily with supper. 30 tablet 6  . torsemide (DEMADEX) 20 MG tablet Take 1 tablet (20 mg total) by mouth daily. Can take extra 20 mg for weight gain of 3 lbs overnight or 5 lbs within one week. 30 tablet 6  . traMADol (ULTRAM) 50 MG tablet Take 1 tablet (50 mg total) by mouth 2 (two) times daily as needed (for pain.). 60 tablet 1  . triamcinolone cream (KENALOG) 0.1 % Apply 1 application topically 2 (two) times daily. 60 g 0  . ursodiol (ACTIGALL) 300 MG capsule Take 300-600 mg by mouth See admin instructions. Take 2 capsules (600 mg) daily after lunch & 1 capsule (300 mg) at night.     No current facility-administered medications for this visit.     Allergies:   Statins; Haldol  [haloperidol lactate]; Librax [chlordiazepoxide-clidinium]; Plavix [clopidogrel bisulfate]; and Ramipril   Social History:  The patient  reports that she has never smoked. She has never used smokeless tobacco. She reports that she does not drink alcohol or use drugs.   Family History:  The patient's  family history includes Cancer in her daughter; Esophageal cancer in her brother; Ovarian cancer in her daughter; Stroke in her mother.    ROS:  Please see the history of present illness.   All other systems are personally reviewed and negative.    PHYSICAL EXAM: VS:  BP 126/72   Pulse 97   Ht 5\' 2"  (1.575 m)   Wt 126 lb 3.2 oz (57.2 kg)   SpO2 99%   BMI 23.08 kg/m  , BMI Body mass index is 23.08 kg/m. GEN: elderly and thin, in no acute  distress  HEENT: normal  Neck: no JVD, carotid bruits, or masses Cardiac: iRRR; no murmurs, rubs, or gallops,no edema  Respiratory:  clear to auscultation bilaterally, normal work of breathing GI: soft, nontender, nondistended, + BS MS: age approriate atrophy  Skin: warm and dry  Neuro:  Strength and sensation are intact Psych: euthymic mood, full affect  EKG:  EKG is ordered today. The ekg ordered today is personally reviewed and shows afib, V rates 90s, nonspecific St/ T changes   Recent Labs: 08/21/2017: ALT 98; TSH 1.888 08/27/2017: Magnesium 2.2 09/05/2017: Hemoglobin 10.0; Platelets 323 11/06/2017: B Natriuretic Peptide 1,241.3; BUN 43; Creatinine, Ser 1.32; Potassium 4.8; Sodium 138  personally reviewed   Lipid Panel     Component Value Date/Time   CHOL 159 05/27/2017 0959   TRIG 101.0 05/27/2017 0959   HDL 64.30 05/27/2017 0959   CHOLHDL 2 05/27/2017 0959   VLDL 20.2 05/27/2017 0959   LDLCALC 74 05/27/2017 0959   LDLDIRECT 143.5 12/17/2012 0955   personally reviewed   Wt Readings from Last 3 Encounters:  12/01/17 126 lb 3.2 oz (57.2 kg)  11/27/17 128 lb 3.2 oz (58.2 kg)  11/25/17 128 lb 9.6 oz (58.3 kg)      Other studies  personally reviewed: Additional studies/ records that were reviewed today include: prior echo, CHF clinic notes  Review of the above records today demonstrates: as above   ASSESSMENT AND PLAN:  1.  Persistent atrial fibrillation The patient has medicine refractory atrial fibrillation with significant atrial enlargement.  Medical therapy is complicated by advanced age and renal failure.  Chads2vasc score is 6.  she is anticoagulated with xarelto . Therapeutic strategies for afib including rate control and rhythm control were discussed in detail with the patient today. Given advanced age and refractory atrial fibrillation, she is not a candidate for ablation. As her V rates are mostly controlled, I would also not advise AV nodal ablation or PPM implant at this time.  Unfortunately, I think that she is about as optimized as she will become.  Unfortunately, I do not have additional options for her. I have advised that she proceed with rehab with regular exercise long term to preserve her functional status.  No changes today  Follow-up with Drs Fletcher Anon and Bensimhon as scheduled I will see as needed  Current medicines are reviewed at length with the patient today.   The patient does not have concerns regarding her medicines.  The following changes were made today:  none  Labs/ tests ordered today include:  Orders Placed This Encounter  Procedures  . EKG 12-Lead     Signed, Ruth Grayer, MD  12/01/2017 10:32 AM     [facility] [address] [place] [address] [phone] (office) [phone] (fax)

## 2017-12-01 NOTE — Patient Instructions (Addendum)
Medication Instructions:  Your physician recommends that you continue on your current medications as directed. Please refer to the Current Medication list given to you today.  Labwork: None ordered.  Testing/Procedures: None ordered.  Follow-Up: Your physician wants you to follow-up in: as needed with Dr. Allred.       Any Other Special Instructions Will Be Listed Below (If Applicable).  If you need a refill on your cardiac medications before your next appointment, please call your pharmacy.   

## 2017-12-02 DIAGNOSIS — M810 Age-related osteoporosis without current pathological fracture: Secondary | ICD-10-CM | POA: Diagnosis not present

## 2017-12-11 ENCOUNTER — Ambulatory Visit
Admission: RE | Admit: 2017-12-11 | Discharge: 2017-12-11 | Disposition: A | Discharge status: Home / Self Care | Payer: Medicare Other | Source: Ambulatory Visit | Attending: Gastroenterology | Admitting: Gastroenterology

## 2017-12-11 DIAGNOSIS — I700 Atherosclerosis of aorta: Secondary | ICD-10-CM | POA: Insufficient documentation

## 2017-12-11 DIAGNOSIS — N281 Cyst of kidney, acquired: Secondary | ICD-10-CM | POA: Diagnosis not present

## 2017-12-11 DIAGNOSIS — K838 Other specified diseases of biliary tract: Secondary | ICD-10-CM | POA: Diagnosis not present

## 2017-12-11 DIAGNOSIS — K743 Primary biliary cirrhosis: Secondary | ICD-10-CM | POA: Insufficient documentation

## 2017-12-11 DIAGNOSIS — Z9049 Acquired absence of other specified parts of digestive tract: Secondary | ICD-10-CM | POA: Insufficient documentation

## 2017-12-14 ENCOUNTER — Other Ambulatory Visit: Payer: Self-pay | Admitting: Internal Medicine

## 2017-12-17 ENCOUNTER — Other Ambulatory Visit: Payer: Self-pay | Admitting: Gastroenterology

## 2017-12-17 DIAGNOSIS — K743 Primary biliary cirrhosis: Secondary | ICD-10-CM | POA: Diagnosis not present

## 2017-12-18 DIAGNOSIS — K743 Primary biliary cirrhosis: Secondary | ICD-10-CM | POA: Diagnosis not present

## 2017-12-19 ENCOUNTER — Other Ambulatory Visit: Payer: Self-pay | Admitting: Internal Medicine

## 2017-12-22 ENCOUNTER — Telehealth: Payer: Self-pay | Admitting: Cardiovascular Disease

## 2017-12-22 NOTE — Telephone Encounter (Signed)
I called and spoke with Valetta Fuller at Dr. Andree Elk office. 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 She came back today and reports ongoing bleeding in the area since her procedure. This was done on Xarelto. Per Dr. Andree Elk- this is a "liver/ jelly clot." They had encouraged the patient to call the office to see if she could stop her xarelto for a time to allow this to heal. They state the patient told them she could not come off of this.   I advised I will review with Dr. Fletcher Anon to see if she may hold xarelto and we will call her back directly.  The patient has a history of permanent a-fib. She recently saw Dr. Rayann Heman- she is not felt to be a candidate for AV node ablation/ PPM.  He advised she may be on the most optimal therapy that she can be on.   To Dr. Fletcher Anon to review.

## 2017-12-22 NOTE — Telephone Encounter (Signed)
Given her very elevated chads vas score, she is at high risk of stroke without anticoagulation.  However, if the bleeding continues, we have to look at the risks and benefits.  Holding Xarelto for 2 days is reasonable to do to allow proper healing.

## 2017-12-22 NOTE — Telephone Encounter (Signed)
I called and spoke with the patient. She is aware of Dr. Tyrell Antonio recommendations that she may hold xarelto for 2 days to help promote proper healing from her gum procedure. She is aware that with her holding xarelto just for 2 days, this does not place her a zero risk for a stroke, that this is a possibility. I have advised her to restart Xarelto ASAP after holding 2 days.   The patient voices understanding of the above.

## 2017-12-22 NOTE — Telephone Encounter (Signed)
[facility] office patient had Gum surgery on Monday 12/15/17  They are calling for she is on blood thinner and now is having issues with Clotting.  She is not able to stop bleeding, Dr Andree Elk is calling is "liver clots"  Would like some advise on this

## 2017-12-25 ENCOUNTER — Ambulatory Visit (INDEPENDENT_AMBULATORY_CARE_PROVIDER_SITE_OTHER): Payer: Medicare Other | Admitting: Cardiovascular Disease

## 2017-12-25 ENCOUNTER — Encounter: Payer: Self-pay | Admitting: Cardiovascular Disease

## 2017-12-25 ENCOUNTER — Other Ambulatory Visit
Admission: RE | Admit: 2017-12-25 | Discharge: 2017-12-25 | Disposition: A | Discharge status: Home / Self Care | Payer: Medicare Other | Source: Ambulatory Visit | Attending: Cardiovascular Disease | Admitting: Cardiovascular Disease

## 2017-12-25 VITALS — BP 110/58 | HR 49 | Ht 62.0 in | Wt 133.8 lb

## 2017-12-25 DIAGNOSIS — I5032 Chronic diastolic (congestive) heart failure: Secondary | ICD-10-CM

## 2017-12-25 DIAGNOSIS — I110 Hypertensive heart disease with heart failure: Secondary | ICD-10-CM | POA: Diagnosis not present

## 2017-12-25 DIAGNOSIS — I10 Essential (primary) hypertension: Secondary | ICD-10-CM

## 2017-12-25 DIAGNOSIS — I2510 Atherosclerotic heart disease of native coronary artery without angina pectoris: Secondary | ICD-10-CM

## 2017-12-25 DIAGNOSIS — I4819 Other persistent atrial fibrillation: Secondary | ICD-10-CM | POA: Insufficient documentation

## 2017-12-25 LAB — CBC
HCT: 33.1 % — ABNORMAL LOW (ref 36.0–46.0)
HEMOGLOBIN: 10 g/dL — AB (ref 12.0–15.0)
MCH: 28.1 pg (ref 26.0–34.0)
MCHC: 30.2 g/dL (ref 30.0–36.0)
MCV: 93 fL (ref 80.0–100.0)
NRBC: 0.3 % — AB (ref 0.0–0.2)
PLATELETS: 314 10*3/uL (ref 150–400)
RBC: 3.56 MIL/uL — AB (ref 3.87–5.11)
RDW: 21.7 % — ABNORMAL HIGH (ref 11.5–15.5)
WBC: 9.9 10*3/uL (ref 4.0–10.5)

## 2017-12-25 LAB — BASIC METABOLIC PANEL
ANION GAP: 13 (ref 5–15)
BUN: 64 mg/dL — ABNORMAL HIGH (ref 8–23)
CALCIUM: 8.8 mg/dL — AB (ref 8.9–10.3)
CO2: 19 mmol/L — AB (ref 22–32)
Chloride: 99 mmol/L (ref 98–111)
Creatinine, Ser: 2.24 mg/dL — ABNORMAL HIGH (ref 0.44–1.00)
GFR, EST AFRICAN AMERICAN: 22 mL/min — AB (ref >60)
GFR, EST NON AFRICAN AMERICAN: 19 mL/min — AB (ref >60)
Glucose, Bld: 100 mg/dL — ABNORMAL HIGH (ref 70–99)
Potassium: 5.4 mmol/L — ABNORMAL HIGH (ref 3.5–5.1)
Sodium: 131 mmol/L — ABNORMAL LOW (ref 135–145)

## 2017-12-25 LAB — TSH: TSH: 7.47 u[IU]/mL — ABNORMAL HIGH (ref 0.350–4.500)

## 2017-12-25 LAB — BRAIN NATRIURETIC PEPTIDE: B Natriuretic Peptide: 1283 pg/mL — ABNORMAL HIGH (ref 0.0–100.0)

## 2017-12-25 MED ORDER — METOPROLOL TARTRATE 50 MG PO TABS: 50.0000 mg | ORAL_TABLET | Freq: Two times a day (BID) | ORAL | 0 refills | Status: DC

## 2017-12-25 NOTE — Patient Instructions (Signed)
Medication Instructions:  DECREASE the Metoprolol to 50 mg twice daily  If you need a refill on your cardiac medications before your next appointment, please call your pharmacy.   Lab work: Your provider would like for you to have the following labs today: CBC, BMET, BNP, and a TSH  If you have labs (blood work) drawn today and your tests are completely normal, you will receive your results only by: Marland Kitchen MyChart Message (if you have MyChart) OR . A paper copy in the mail If you have any lab test that is abnormal or we need to change your treatment, we will call you to review the results.  Follow-Up: At [facility], you and your health needs are our priority.  As part of our continuing mission to provide you with exceptional heart care, we have created designated Provider Care Teams.  These Care Teams include your primary Cardiologist (physician) and Advanced Practice Providers (APPs -  Physician Assistants and Nurse Practitioners) who all work together to provide you with the care you need, when you need it. You will need a follow up appointment in 2 months. You may see [name], MD or one of the following Advanced Practice Providers on your designated Care Team:   Murray Hodgkins, NP Christell Faith, PA-C . Marrianne Mood, PA-C

## 2017-12-25 NOTE — Progress Notes (Signed)
Cardiology Office Note   Date:  12/25/2017   ID:  Ruth Roth, DOB 1929-01-01, MRN [id number]  PCP:  Einar Pheasant, MD  Cardiologist:   [name], MD   Chief Complaint  Patient presents with  . OTHER    3 month f/u c/o fatigue, nausea/vomiting water. Meds reviewed verbally with pt.      History of Present Illness: Ruth Roth is a 84 y.o. female who presents for a follow-up visit regarding coronary artery disease ,chronic diastolic heart failure, mitral regurgitation and persistent atrial fibrillation .  She has known history of hypertension, peptic ulcer disease, fibrocystic breast disease, inflammatory arthritis, autoimmune hepatitis and neuropathy.  She had a small non-ST elevation myocardial infarction in March 2016 with acute diastolic heart failure after a GI illness.  Echocardiogram showed normal LV systolic function. Cardiac catheterization showed showed 60% ostial left circumflex hazy stenosis which was possibly the culprit. Mild LAD/RCA disease. She was treated medically.   She had cardioversion 3 times in 2018/2019. Due to recurrent atrial fibrillation, I elected for rate control and discontinued amiodarone.  She had worsening heart failure.  Right and left cardiac catheterization in June  showed mild nonobstructive coronary artery disease.  Ejection fraction was 50 to 55% with severe mitral regurgitation.  Right heart catheterization showed severely elevated filling pressures with moderate to severe pulmonary hypertension and severely reduced cardiac output.  I transferred the patient to [facility] where she was effectively diuresed with milrinone with subsequent improvement in symptoms.  She had a transesophageal echocardiogram done which showed normal LV systolic function with only mild mitral regurgitation.  The mitral regurgitation was felt to be functional with subsequent improvement after diuresis.  She reports not feeling well over the last week with diarrhea,  poor appetite and nausea.  She feels that she has a virus.  In spite of that, her weight has increased about 5 pounds from baseline.  There has been some worsening dyspnea.   Past Medical History:  Diagnosis Date  . (HFpEF) heart failure with preserved ejection fraction ([place])    a. 07/2017 Echo: EF 50-55%, no rwma, mild AS/AI, mod MR, mod dil LA, mod TR, PASP 42mmHg.  . Autoimmune hepatitis ([place])    followed by Dr Ruth Roth  . Fibrocystic breast disease   . Glaucoma   . Hypercholesterolemia   . Hypertension   . Hypothyroidism   . Inflammatory arthritis   . MI (myocardial infarction) ([place])   . Neuropathy   . Non-obstructive Coronary artery disease    a. 05/2014 NSTEMI/Cath: LM nl, LAD mild dzs, LCX 60ost hazy (? culprit), RCA mild dzs. EF 60% by echo-->Med Rx; b. 08/2017 Cath: LM 30ost, LAD min irregs, LCX small, 40ost/p, RCA large, min irregs, RPDA/RPL1 nl, EF 50-55%. 4+MR.  . Osteoarthritis   . Permanent atrial fibrillation    a. CHA2DS2VASc = 6-->Xarelto.  . 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 Severe mitral regurgitation    a. 07/2017 Echo: Mod MR; b. 08/2017 Cath: 4+/Severe MR.    Past Surgical History:  Procedure Laterality Date  . ABDOMINAL HYSTERECTOMY  1963   partial, secondary to fibroids  . APPENDECTOMY    . Billroth    . CARDIAC CATHETERIZATION  05/12/14   [facility]  . CARDIOVERSION N/A 12/26/2016   Procedure: CARDIOVERSION;  Surgeon: [name], MD;  Location: [facility] ORS;  Service: Cardiovascular;  Laterality: N/A;  . CARDIOVERSION N/A 05/16/2017  Procedure: CARDIOVERSION;  Surgeon: [name], MD;  Location: [facility] ORS;  Service: Cardiovascular;  Laterality: N/A;  . CARDIOVERSION N/A 06/09/2017   Procedure: CARDIOVERSION;  Surgeon: [name], MD;  Location: [facility] ORS;  Service: Cardiovascular;  Laterality: N/A;  . CHOLECYSTECTOMY  1998  . COLONOSCOPY  2009  . RIGHT/LEFT HEART CATH AND CORONARY  ANGIOGRAPHY Bilateral 08/21/2017   Procedure: RIGHT/LEFT HEART CATH AND CORONARY ANGIOGRAPHY;  Surgeon: [name], MD;  Location: [facility];  Service: Cardiovascular;  Laterality: Bilateral;  . TEE WITHOUT CARDIOVERSION N/A 08/25/2017   Procedure: TRANSESOPHAGEAL ECHOCARDIOGRAM (TEE);  Surgeon: Jolaine Artist, MD;  Location: [facility]COPY;  Service: Cardiovascular;  Laterality: N/A;  . UPPER GI ENDOSCOPY  2004     Current Outpatient Medications  Medication Sig Dispense Refill  . brimonidine-timolol (COMBIGAN) 0.2-0.5 % ophthalmic solution Place 1 drop into the left eye 2 (two) times daily.     . dorzolamide (TRUSOPT) 2 % ophthalmic solution Place 1 drop into the left eye 2 (two) times daily.    . fluticasone (FLONASE) 50 MCG/ACT nasal spray Place 2 sprays into both nostrils daily as needed for allergies.     Marland Kitchen gabapentin (NEURONTIN) 300 MG capsule TAKE 2 CAPSULES BY MOUTH FOUR TIMES DAILY (Patient taking differently: Takes 1 tablet am 1 tablet noon and 2 tablets at bedtime daily.) 720 capsule 0  . ipratropium (ATROVENT) 0.06 % nasal spray Place 2 sprays into both nostrils daily as needed for rhinitis.     Marland Kitchen latanoprost (XALATAN) 0.005 % ophthalmic solution Place 1 drop into both eyes at bedtime.     Marland Kitchen levothyroxine (SYNTHROID, LEVOTHROID) 112 MCG tablet TAKE 1 TABLET BY MOUTH EVERY DAY BEFORE BREAKFAST 90 tablet 0  . metoprolol tartrate (LOPRESSOR) 100 MG tablet Take 1 tablet (100 mg total) by mouth 2 (two) times daily. 180 tablet 3  . pantoprazole (PROTONIX) 40 MG tablet Take 1 tablet (40 mg total) by mouth every evening. 90 tablet 3  . potassium chloride (K-DUR,KLOR-CON) 20 MEQ tablet Take 1 tablet (20 mEq total) by mouth daily. 30 tablet 6  . ranitidine (ZANTAC) 150 MG tablet TAKE 1 TABLET(150 MG) BY MOUTH DAILY 30 MINUTES BEFORE BREAKFAST 30 tablet 0  . Rivaroxaban (XARELTO) 15 MG TABS tablet Take 1 tablet (15 mg total) by mouth daily with supper. 30 tablet 6  .  torsemide (DEMADEX) 20 MG tablet Take 1 tablet (20 mg total) by mouth daily. Can take extra 20 mg for weight gain of 3 lbs overnight or 5 lbs within one week. 30 tablet 6  . traMADol (ULTRAM) 50 MG tablet Take 1 tablet (50 mg total) by mouth 2 (two) times daily as needed (for pain.). 60 tablet 1  . triamcinolone cream (KENALOG) 0.1 % Apply 1 application topically 2 (two) times daily. 60 g 0  . ursodiol (ACTIGALL) 300 MG capsule Take 300-600 mg by mouth See admin instructions. Take 2 capsules (600 mg) daily after lunch & 1 capsule (300 mg) at night.     No current facility-administered medications for this visit.     Allergies:   Statins; Haldol [haloperidol lactate]; Librax [chlordiazepoxide-clidinium]; Plavix [clopidogrel bisulfate]; and Ramipril    Social History:  The patient  reports that she has never smoked. She has never used smokeless tobacco. She reports that she does not drink alcohol or use drugs.   Family History:  The patient's family history includes Cancer in her daughter; Esophageal cancer in her brother; Ovarian cancer in  her daughter; Stroke in her mother.    ROS:  Please see the history of present illness.   Otherwise, review of systems are positive for none.   All other systems are reviewed and negative.    PHYSICAL EXAM: VS:  BP (!) 110/58 (BP Location: Left Arm, Patient Position: Sitting, Cuff Size: Normal)   Pulse (!) 49   Ht 5\' 2"  (1.575 m)   Wt 133 lb 12 oz (60.7 kg)   BMI 24.46 kg/m  , BMI Body mass index is 24.46 kg/m. GEN: Well nourished, well developed, in no acute distress  HEENT: normal  Neck: Mild JVD, carotid bruits, or masses Cardiac: Regular rate and rhythm but bradycardic; no rubs, or gallops,no edema . 1/ 6 systolic ejection murmur at the aortic area.  Mild bilateral leg edema Respiratory: Few bibasilar crackles GI: soft, nontender, nondistended, + BS MS: no deformity or atrophy  Skin: warm and dry, no rash Neuro:  Strength and sensation are  intact Psych: euthymic mood, full affect   EKG:  EKG is ordered today. The ekg ordered today demonstrates sinus bradycardia with no significant ST or T wave changes.  Recent Labs: 08/21/2017: ALT 98; TSH 1.888 08/27/2017: Magnesium 2.2 09/05/2017: Hemoglobin 10.0; Platelets 323 11/06/2017: B Natriuretic Peptide 1,241.3; BUN 43; Creatinine, Ser 1.32; Potassium 4.8; Sodium 138    Lipid Panel    Component Value Date/Time   CHOL 159 05/27/2017 0959   TRIG 101.0 05/27/2017 0959   HDL 64.30 05/27/2017 0959   CHOLHDL 2 05/27/2017 0959   VLDL 20.2 05/27/2017 0959   LDLCALC 74 05/27/2017 0959   LDLDIRECT 143.5 12/17/2012 0955      Wt Readings from Last 3 Encounters:  12/25/17 133 lb 12 oz (60.7 kg)  12/01/17 126 lb 3.2 oz (57.2 kg)  11/27/17 128 lb 3.2 oz (58.2 kg)       ASSESSMENT AND PLAN:  1.  Persistent atrial fibrillation: Surprisingly, she is in sinus rhythm today and she is bradycardic.  She is significantly fatigued and thus I elected to decrease the dose of metoprolol to 50 mg twice daily.  I asked her to monitor her heart rate at home.  Continue anticoagulation with Xarelto.  2. Chronic diastolic heart failure: She appears to be mildly volume overloaded but her appetite has been poor.  I am going to obtain routine labs before I asked her to increase torsemide.  3. Essential hypertension: Blood pressure is controlled on metoprolol.  4.  Functional mitral regurgitation: Severe MR was only in the setting of severe volume overload.  MR was only mild on most recent transesophageal echo.  5.  Hypothyroidism on replacement: I am going to check TSH given her bradycardia and generalized complaints.   Disposition:   FU with me in 2 months.  Signed,  [name], MD  12/25/2017 1:54 PM    [place]

## 2017-12-26 ENCOUNTER — Other Ambulatory Visit: Payer: Self-pay

## 2017-12-26 ENCOUNTER — Other Ambulatory Visit: Payer: Self-pay | Admitting: Internal Medicine

## 2017-12-26 ENCOUNTER — Telehealth: Payer: Self-pay | Admitting: *Deleted

## 2017-12-26 DIAGNOSIS — E039 Hypothyroidism, unspecified: Secondary | ICD-10-CM

## 2017-12-26 MED ORDER — LEVOTHYROXINE SODIUM 125 MCG PO TABS: 125.0000 ug | ORAL_TABLET | Freq: Every day | ORAL | 0 refills | Status: DC

## 2017-12-26 NOTE — Telephone Encounter (Signed)
Left a message for the patient to call back.  

## 2017-12-26 NOTE — Telephone Encounter (Signed)
-----   Message from [name], MD sent at 12/25/2017  5:46 PM EDT ----- Inform patient that labs showed worsening renal function,dehydration and mildly elevated potassium.  I recommend holding torsemide and potassium chloride for now until we recheck her kidney function.  She should increase fluid intake.  Recheck basic metabolic profile on Monday.  If her symptoms worsen, she should go to the emergency room for admission. Her TSH is mildly elevated and the dose of her thyroid medication might need to be adjusted.  I am forwarding this to Dr. Nicki Reaper.

## 2017-12-26 NOTE — Addendum Note (Signed)
Addended by: Ricci Barker on: 12/26/2017 10:50 AM   Modules accepted: Orders

## 2017-12-26 NOTE — Telephone Encounter (Signed)
Patient made aware of results and verbalized understanding of Dr. Tyrell Antonio recommendations. She will hold her torsemide and potassium until further notice. She will go to [facility] on Monday for repeat lab work (BMET only) Orders already placed.   If her symptoms worsen she will go to the ED for admission. She did state that she felt better today than yesterday.

## 2017-12-26 NOTE — Progress Notes (Signed)
Order placed for f/u tsh.  

## 2017-12-26 NOTE — Addendum Note (Signed)
Addended by: Ricci Barker on: 12/26/2017 10:54 AM   Modules accepted: Orders

## 2017-12-28 ENCOUNTER — Other Ambulatory Visit: Payer: Self-pay | Admitting: Internal Medicine

## 2017-12-29 ENCOUNTER — Other Ambulatory Visit
Admission: RE | Admit: 2017-12-29 | Discharge: 2017-12-29 | Disposition: A | Discharge status: Home / Self Care | Payer: Medicare Other | Source: Ambulatory Visit | Attending: Cardiovascular Disease | Admitting: Cardiovascular Disease

## 2017-12-29 ENCOUNTER — Telehealth: Payer: Self-pay | Admitting: *Deleted

## 2017-12-29 ENCOUNTER — Other Ambulatory Visit: Payer: Self-pay | Admitting: Cardiovascular Disease

## 2017-12-29 DIAGNOSIS — M810 Age-related osteoporosis without current pathological fracture: Secondary | ICD-10-CM | POA: Diagnosis not present

## 2017-12-29 DIAGNOSIS — H401131 Primary open-angle glaucoma, bilateral, mild stage: Secondary | ICD-10-CM | POA: Diagnosis not present

## 2017-12-29 DIAGNOSIS — Z01812 Encounter for preprocedural laboratory examination: Secondary | ICD-10-CM | POA: Diagnosis not present

## 2017-12-29 LAB — CBC
HEMATOCRIT: 32.9 % — AB (ref 36.0–46.0)
HEMOGLOBIN: 9.7 g/dL — AB (ref 12.0–15.0)
MCH: 27.6 pg (ref 26.0–34.0)
MCHC: 29.5 g/dL — AB (ref 30.0–36.0)
MCV: 93.5 fL (ref 80.0–100.0)
NRBC: 0 % (ref 0.0–0.2)
Platelets: 332 10*3/uL (ref 150–400)
RBC: 3.52 MIL/uL — ABNORMAL LOW (ref 3.87–5.11)
RDW: 21.8 % — AB (ref 11.5–15.5)
WBC: 8.7 10*3/uL (ref 4.0–10.5)

## 2017-12-29 LAB — BASIC METABOLIC PANEL
Anion gap: 8 (ref 5–15)
BUN: 43 mg/dL — AB (ref 8–23)
CHLORIDE: 108 mmol/L (ref 98–111)
CO2: 21 mmol/L — ABNORMAL LOW (ref 22–32)
CREATININE: 1.18 mg/dL — AB (ref 0.44–1.00)
Calcium: 8.7 mg/dL — ABNORMAL LOW (ref 8.9–10.3)
GFR calc Af Amer: 47 mL/min — ABNORMAL LOW (ref >60)
GFR calc non Af Amer: 40 mL/min — ABNORMAL LOW (ref >60)
Glucose, Bld: 96 mg/dL (ref 70–99)
Potassium: 4.7 mmol/L (ref 3.5–5.1)
Sodium: 137 mmol/L (ref 135–145)

## 2017-12-29 LAB — PROTIME-INR
INR: 1.63
Prothrombin Time: 19.1 seconds — ABNORMAL HIGH (ref 11.4–15.2)

## 2017-12-29 NOTE — Telephone Encounter (Signed)
-----   Message from [name], MD sent at 12/29/2017  3:16 PM EDT ----- Inform patient that renal function improved to baseline.  Resume torsemide 20 mg once daily without potassium.

## 2017-12-29 NOTE — Telephone Encounter (Signed)
Left a message for the patient to call back.  

## 2017-12-30 ENCOUNTER — Telehealth: Payer: Self-pay | Admitting: Cardiovascular Disease

## 2017-12-30 NOTE — Telephone Encounter (Signed)
Please call patient regarding how to take her medications.

## 2017-12-30 NOTE — Telephone Encounter (Signed)
Patient made aware of results and to resume the Torsemide 20 mg daily without the potassium.  She is concerned about her lower extremity edema that has increased over the last few days since she has been off of the Torsemide. She stated that she weighed 133 pounds this morning which is what she weighed at her last office visit on 12/25/17.  She has been advised to restart the Torsemide as instructed and if she does not see any results to call the office back.

## 2017-12-30 NOTE — Telephone Encounter (Signed)
Duplicate message. 

## 2018-01-01 DIAGNOSIS — D509 Iron deficiency anemia, unspecified: Secondary | ICD-10-CM | POA: Diagnosis not present

## 2018-01-05 DIAGNOSIS — H401131 Primary open-angle glaucoma, bilateral, mild stage: Secondary | ICD-10-CM | POA: Diagnosis not present

## 2018-01-13 ENCOUNTER — Other Ambulatory Visit: Payer: Self-pay | Admitting: Internal Medicine

## 2018-01-14 ENCOUNTER — Telehealth: Payer: Self-pay

## 2018-01-14 NOTE — Telephone Encounter (Signed)
Ruth Roth returned my message saying she is cleared to come to [facility] and would like to start Monday.  I reviewed we would do her 6 min walk that day and get her started with exercise.

## 2018-01-20 DIAGNOSIS — H401131 Primary open-angle glaucoma, bilateral, mild stage: Secondary | ICD-10-CM | POA: Diagnosis not present

## 2018-01-27 ENCOUNTER — Other Ambulatory Visit (INDEPENDENT_AMBULATORY_CARE_PROVIDER_SITE_OTHER): Payer: Medicare Other

## 2018-01-27 DIAGNOSIS — R739 Hyperglycemia, unspecified: Secondary | ICD-10-CM

## 2018-01-27 DIAGNOSIS — D649 Anemia, unspecified: Secondary | ICD-10-CM

## 2018-01-27 DIAGNOSIS — E039 Hypothyroidism, unspecified: Secondary | ICD-10-CM

## 2018-01-27 DIAGNOSIS — E7800 Pure hypercholesterolemia, unspecified: Secondary | ICD-10-CM | POA: Diagnosis not present

## 2018-01-27 DIAGNOSIS — K754 Autoimmune hepatitis: Secondary | ICD-10-CM

## 2018-01-27 DIAGNOSIS — I10 Essential (primary) hypertension: Secondary | ICD-10-CM

## 2018-01-27 LAB — CBC WITH DIFFERENTIAL/PLATELET
BASOS ABS: 0 10*3/uL (ref 0.0–0.1)
Basophils Relative: 0.3 % (ref 0.0–3.0)
EOS PCT: 3.1 % (ref 0.0–5.0)
Eosinophils Absolute: 0.3 10*3/uL (ref 0.0–0.7)
HEMATOCRIT: 31.7 % — AB (ref 36.0–46.0)
HEMOGLOBIN: 10.2 g/dL — AB (ref 12.0–15.0)
LYMPHS ABS: 0.9 10*3/uL (ref 0.7–4.0)
LYMPHS PCT: 9.4 % — AB (ref 12.0–46.0)
MCHC: 32.2 g/dL (ref 30.0–36.0)
MCV: 88.9 fl (ref 78.0–100.0)
MONOS PCT: 9.7 % (ref 3.0–12.0)
Monocytes Absolute: 0.9 10*3/uL (ref 0.1–1.0)
Neutro Abs: 7.5 10*3/uL (ref 1.4–7.7)
Neutrophils Relative %: 77.5 % — ABNORMAL HIGH (ref 43.0–77.0)
Platelets: 206 10*3/uL (ref 150.0–400.0)
RBC: 3.57 Mil/uL — AB (ref 3.87–5.11)
RDW: 19.1 % — ABNORMAL HIGH (ref 11.5–15.5)
WBC: 9.7 10*3/uL (ref 4.0–10.5)

## 2018-01-27 LAB — HEPATIC FUNCTION PANEL
ALK PHOS: 119 U/L — AB (ref 39–117)
ALT: 14 U/L (ref 0–35)
AST: 14 U/L (ref 0–37)
Albumin: 3.6 g/dL (ref 3.5–5.2)
Bilirubin, Direct: 0.1 mg/dL (ref 0.0–0.3)
Total Bilirubin: 0.3 mg/dL (ref 0.2–1.2)
Total Protein: 6.2 g/dL (ref 6.0–8.3)

## 2018-01-27 LAB — BASIC METABOLIC PANEL
BUN: 63 mg/dL — ABNORMAL HIGH (ref 6–23)
CALCIUM: 8.4 mg/dL (ref 8.4–10.5)
CHLORIDE: 97 meq/L (ref 96–112)
CO2: 28 meq/L (ref 19–32)
CREATININE: 1.71 mg/dL — AB (ref 0.40–1.20)
GFR: 29.97 mL/min — ABNORMAL LOW (ref >60.00)
GLUCOSE: 95 mg/dL (ref 70–99)
Potassium: 4.2 mEq/L (ref 3.5–5.1)
SODIUM: 136 meq/L (ref 135–145)

## 2018-01-27 LAB — LIPID PANEL
CHOL/HDL RATIO: 3
Cholesterol: 144 mg/dL (ref 0–200)
HDL: 44.1 mg/dL (ref >39.00)
LDL CALC: 78 mg/dL (ref 0–99)
NONHDL: 99.73
Triglycerides: 107 mg/dL (ref 0.0–149.0)
VLDL: 21.4 mg/dL (ref 0.0–40.0)

## 2018-01-27 LAB — TSH: TSH: 2.26 u[IU]/mL (ref 0.35–4.50)

## 2018-01-27 LAB — HEMOGLOBIN A1C: HEMOGLOBIN A1C: 5.6 % (ref 4.6–6.5)

## 2018-01-27 LAB — FERRITIN: FERRITIN: 17 ng/mL (ref 10.0–291.0)

## 2018-01-28 ENCOUNTER — Other Ambulatory Visit: Payer: Self-pay | Admitting: Internal Medicine

## 2018-01-28 DIAGNOSIS — E871 Hypo-osmolality and hyponatremia: Secondary | ICD-10-CM

## 2018-01-28 NOTE — Progress Notes (Signed)
Order placed for f/u sodium.  ?

## 2018-01-30 ENCOUNTER — Telehealth: Payer: Self-pay | Admitting: Internal Medicine

## 2018-01-30 ENCOUNTER — Ambulatory Visit (INDEPENDENT_AMBULATORY_CARE_PROVIDER_SITE_OTHER): Payer: Medicare Other | Admitting: Internal Medicine

## 2018-01-30 ENCOUNTER — Encounter: Payer: Self-pay | Admitting: Internal Medicine

## 2018-01-30 VITALS — BP 138/62 | HR 63 | Temp 98.2°F | Resp 18 | Wt 135.6 lb

## 2018-01-30 DIAGNOSIS — I5031 Acute diastolic (congestive) heart failure: Secondary | ICD-10-CM

## 2018-01-30 DIAGNOSIS — R944 Abnormal results of kidney function studies: Secondary | ICD-10-CM

## 2018-01-30 DIAGNOSIS — D649 Anemia, unspecified: Secondary | ICD-10-CM

## 2018-01-30 DIAGNOSIS — R040 Epistaxis: Secondary | ICD-10-CM | POA: Diagnosis not present

## 2018-01-30 DIAGNOSIS — R208 Other disturbances of skin sensation: Secondary | ICD-10-CM | POA: Diagnosis not present

## 2018-01-30 DIAGNOSIS — K754 Autoimmune hepatitis: Secondary | ICD-10-CM

## 2018-01-30 DIAGNOSIS — R300 Dysuria: Secondary | ICD-10-CM | POA: Diagnosis not present

## 2018-01-30 DIAGNOSIS — E7800 Pure hypercholesterolemia, unspecified: Secondary | ICD-10-CM

## 2018-01-30 DIAGNOSIS — I10 Essential (primary) hypertension: Secondary | ICD-10-CM

## 2018-01-30 DIAGNOSIS — N289 Disorder of kidney and ureter, unspecified: Secondary | ICD-10-CM

## 2018-01-30 DIAGNOSIS — I2510 Atherosclerotic heart disease of native coronary artery without angina pectoris: Secondary | ICD-10-CM

## 2018-01-30 DIAGNOSIS — E039 Hypothyroidism, unspecified: Secondary | ICD-10-CM | POA: Diagnosis not present

## 2018-01-30 DIAGNOSIS — K219 Gastro-esophageal reflux disease without esophagitis: Secondary | ICD-10-CM | POA: Diagnosis not present

## 2018-01-30 DIAGNOSIS — M545 Low back pain, unspecified: Secondary | ICD-10-CM

## 2018-01-30 DIAGNOSIS — Z23 Encounter for immunization: Secondary | ICD-10-CM | POA: Diagnosis not present

## 2018-01-30 DIAGNOSIS — I4819 Other persistent atrial fibrillation: Secondary | ICD-10-CM

## 2018-01-30 MED ORDER — TRAMADOL HCL 50 MG PO TABS: 50.0000 mg | ORAL_TABLET | Freq: Every day | ORAL | 0 refills | Status: DC | PRN

## 2018-01-30 NOTE — Telephone Encounter (Signed)
-----   Message from [name], MD sent at 01/29/2018  2:35 PM EST ----- Regarding: RE: question and update.   I think torsemide every other day is reasonable.  However, her condition is really concerning given that she did this before in the setting of significant volume overload that required milrinone and intravenous diuresis.  It is possible she is having cardiorenal syndrome again. I suggest that she keeps her appointment with Dr. Haroldine Laws on December 4 as she might require a repeat right heart catheterization to evaluate her volume status. Thanks.     ----- Message ----- From: Einar Pheasant, MD Sent: 01/29/2018   4:56 AM EST To: [name], MD Subject: question and update.                           I am scheduled to see Ruth Roth tomorrow (01/30/18) for a regular follow up appointment.  I obtained labs prior to her appointment.  Her BUN and creatinine have worsened.  In review, you recently held her torsemide and kidney function improved.  She started having increased lower extremity swelling, so was placed back on the torsemide.  I have asked her to hold the torsemide for now.  I plan to recheck her kidney function in the next several days.  I will reassess her volume status at her appointment.  May discuss with her regarding possibly adjusting the dose to every other day.  I am also going to have her follow up with GI regarding the iron deficient anemia.  I wanted to give you an update and see if you had any other suggestions.    Thank you for your help.  Einar Pheasant

## 2018-01-30 NOTE — Progress Notes (Signed)
[name], female   DOB: 1928/10/22, 84 y.o.   MRN: [id number]   Subjective:    [name], female    DOB: 1928/10/24, 84 y.o.   MRN: [id number]  HPI  Patient here for a scheduled follow up.  She has a history of CAD, chronic diastolic heart failure, MR and atrial fib.  Has known hypertension and PUD.  Recently admitted and was effectively diresed with milrinone with subsequent improvement in symptoms.  Recently had torsemide dose adjusted given worsening renal function.  Off for a brief period.  Back on torsemide now.  Recent labs with elevated creatinine/decreased GFR.  Had her hold torsemide yesterday.  Has not taken yet today.  Discussed with cardiology.  Will adjust torsemide to qod and follow renal function and volume status.  She reports having some increased nose bleeds recently.  A few episodes today.  No bleeding now.  Given persistent intermittent bleeding, will have ENT evaluate for question of need for cauterization.  In reviewing, one stool card - heme positive.  Discussed f/u with GI.  Also discussed f/u with hematology given persistent iron deficient anemia and intolerance to oral iron.  Also given persistent pain and need for tramadol, discussed referral to pain clinic for long term management.     Past Medical History:  Diagnosis Date  . (HFpEF) heart failure with preserved ejection fraction ([place])    a. 07/2017 Echo: EF 50-55%, no rwma, mild AS/AI, mod MR, mod dil LA, mod TR, PASP 19mmHg.  . Autoimmune hepatitis ([place])    followed by Dr [name]  . Fibrocystic breast disease   . Glaucoma   . Hypercholesterolemia   . Hypertension   . Hypothyroidism   . Inflammatory arthritis   . MI (myocardial infarction) ([place])   . Neuropathy   . Non-obstructive Coronary artery disease    a. 05/2014 NSTEMI/Cath: LM nl, LAD mild dzs, LCX 60ost hazy (? culprit), RCA mild dzs. EF 60% by echo-->Med Rx; b. 08/2017 Cath: LM 30ost, LAD min irregs, LCX small, 40ost/p, RCA  large, min irregs, RPDA/RPL1 nl, EF 50-55%. 4+MR.  . Osteoarthritis   . Permanent atrial fibrillation    a. CHA2DS2VASc = 6-->Xarelto.  . 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 Severe mitral regurgitation    a. 07/2017 Echo: Mod MR; b. 08/2017 Cath: 4+/Severe MR.   Past Surgical History:  Procedure Laterality Date  . ABDOMINAL HYSTERECTOMY  1963   partial, secondary to fibroids  . APPENDECTOMY    . Billroth    . CARDIAC CATHETERIZATION  05/12/14   [facility]  . CARDIOVERSION N/A 12/26/2016   Procedure: CARDIOVERSION;  Surgeon: [name], MD;  Location: [facility] ORS;  Service: Cardiovascular;  Laterality: N/A;  . CARDIOVERSION N/A 05/16/2017   Procedure: CARDIOVERSION;  Surgeon: [name], MD;  Location: [facility] ORS;  Service: Cardiovascular;  Laterality: N/A;  . CARDIOVERSION N/A 06/09/2017   Procedure: CARDIOVERSION;  Surgeon: [name], MD;  Location: [facility] ORS;  Service: Cardiovascular;  Laterality: N/A;  . CHOLECYSTECTOMY  1998  . COLONOSCOPY  2009  . RIGHT/LEFT HEART CATH AND CORONARY ANGIOGRAPHY Bilateral 08/21/2017   Procedure: RIGHT/LEFT HEART CATH AND CORONARY ANGIOGRAPHY;  Surgeon: [name], MD;  Location: [place] [facility];  Service: Cardiovascular;  Laterality: Bilateral;  . TEE WITHOUT CARDIOVERSION N/A 08/25/2017   Procedure: TRANSESOPHAGEAL ECHOCARDIOGRAM (TEE);  Surgeon: Jolaine Artist, MD;  Location: [facility] ENDOSCOPY;  Service: Cardiovascular;  Laterality: N/A;  . UPPER GI ENDOSCOPY  2004   Family History  Problem Relation Age of Onset  . Stroke Mother   . Esophageal cancer Brother        also had lung cancer  . Cancer Daughter        colon  . Ovarian cancer Daughter   . Breast cancer Neg Hx   . Colon cancer Neg Hx    Social History   Socioeconomic History  . Marital status: Widowed    Spouse name: Not on file  . Number of children: Not on file  . Years of education: Not on file  .  Highest education level: Not on file  Occupational History  . Not on file  Social Needs  . Financial resource strain: Not on file  . Food insecurity:    Worry: Not on file    Inability: Not on file  . Transportation needs:    Medical: Not on file    Non-medical: Not on file  Tobacco Use  . Smoking status: Never Smoker  . Smokeless tobacco: Never Used  Substance and Sexual Activity  . Alcohol use: No    Alcohol/week: 0.0 standard drinks  . Drug use: No  . Sexual activity: Never  Lifestyle  . Physical activity:    Days per week: Not on file    Minutes per session: Not on file  . Stress: Not on file  Relationships  . Social connections:    Talks on phone: Not on file    Gets together: Not on file    Attends religious service: Not on file    Active member of club or organization: Not on file    Attends meetings of clubs or organizations: Not on file    Relationship status: Not on file  Other Topics Concern  . Not on file  Social History Narrative  . Not on file    Outpatient Encounter Medications as of 01/30/2018  Medication Sig  . brimonidine-timolol (COMBIGAN) 0.2-0.5 % ophthalmic solution Place 1 drop into the left eye 2 (two) times daily.   . dorzolamide (TRUSOPT) 2 % ophthalmic solution Place 1 drop into the left eye 2 (two) times daily.  . fluticasone (FLONASE) 50 MCG/ACT nasal spray Place 2 sprays into both nostrils daily as needed for allergies.   Marland Kitchen gabapentin (NEURONTIN) 300 MG capsule TAKE 2 CAPSULES BY MOUTH FOUR TIMES DAILY  . ipratropium (ATROVENT) 0.06 % nasal spray Place 2 sprays into both nostrils daily as needed for rhinitis.   Marland Kitchen latanoprost (XALATAN) 0.005 % ophthalmic solution Place 1 drop into both eyes at bedtime.   Marland Kitchen levothyroxine (SYNTHROID, LEVOTHROID) 125 MCG tablet Take 1 tablet (125 mcg total) by mouth daily before breakfast.  . metoprolol tartrate (LOPRESSOR) 50 MG tablet TAKE 1 TABLET(50 MG) BY MOUTH TWICE DAILY  . pantoprazole (PROTONIX) 40 MG  tablet Take 1 tablet (40 mg total) by mouth every evening.  . Rivaroxaban (XARELTO) 15 MG TABS tablet Take 1 tablet (15 mg total) by mouth daily with supper.  . torsemide (DEMADEX) 20 MG tablet Take 1 tablet (20 mg total) by mouth daily. Can take extra 20 mg for weight gain of 3 lbs overnight or 5 lbs within one week.  . traMADol (ULTRAM) 50 MG tablet Take 1 tablet (50 mg total) by mouth daily as needed (for pain.).  Marland Kitchen ursodiol (ACTIGALL) 300 MG capsule Take 300-600 mg by mouth See admin instructions. Take 2 capsules (600 mg) daily  after lunch & 1 capsule (300 mg) at night.  . [DISCONTINUED] ranitidine (ZANTAC) 150 MG tablet TAKE 1 TABLET(150 MG) BY MOUTH DAILY 30 MINUTES BEFORE BREAKFAST  . [DISCONTINUED] traMADol (ULTRAM) 50 MG tablet Take 1 tablet (50 mg total) by mouth 2 (two) times daily as needed (for pain.).  . [DISCONTINUED] triamcinolone cream (KENALOG) 0.1 % Apply 1 application topically 2 (two) times daily.   No facility-administered encounter medications on file as of 01/30/2018.     Review of Systems  Constitutional: Negative for appetite change and unexpected weight change.  HENT: Negative for congestion and sinus pressure.        Intermittent nose bleeds as outlined.    Respiratory: Negative for cough and chest tightness.        Breathing stable.    Cardiovascular: Negative for chest pain and leg swelling.  Gastrointestinal: Negative for abdominal pain, diarrhea, nausea and vomiting.  Genitourinary: Negative for difficulty urinating and dysuria.  Musculoskeletal: Negative for joint swelling and myalgias.  Skin: Negative for color change and rash.  Neurological: Negative for dizziness, light-headedness and headaches.  Psychiatric/Behavioral: Negative for agitation and dysphoric mood.       Objective:    Physical Exam  Constitutional: She appears well-developed and well-nourished. No distress.  HENT:  Nose: Nose normal.  Mouth/Throat: Oropharynx is clear and moist.    Neck: Neck supple. No thyromegaly present.  Cardiovascular: Normal rate.  Rate controlled.    Pulmonary/Chest: Breath sounds normal. No respiratory distress. She has no wheezes.  Abdominal: Soft. Bowel sounds are normal. There is no tenderness.  Musculoskeletal: She exhibits no tenderness.  No increased edema.    Lymphadenopathy:    She has no cervical adenopathy.  Skin: No rash noted. No erythema.  Psychiatric: She has a normal mood and affect. Her behavior is normal.    BP 138/62 (BP Location: Left Arm, Patient Position: Sitting, Cuff Size: Normal)   Pulse 63   Temp 98.2 F (36.8 C) (Oral)   Resp 18   Wt 135 lb 9.6 oz (61.5 kg)   SpO2 98%   BMI 24.80 kg/m  Wt Readings from Last 3 Encounters:  01/30/18 135 lb 9.6 oz (61.5 kg)  12/25/17 133 lb 12 oz (60.7 kg)  12/01/17 126 lb 3.2 oz (57.2 kg)     Lab Results  Component Value Date   WBC 9.7 01/27/2018   HGB 10.2 (L) 01/27/2018   HCT 31.7 (L) 01/27/2018   PLT 206.0 01/27/2018   GLUCOSE 87 01/30/2018   CHOL 144 01/27/2018   TRIG 107.0 01/27/2018   HDL 44.10 01/27/2018   LDLDIRECT 143.5 12/17/2012   LDLCALC 78 01/27/2018   ALT 14 01/27/2018   AST 14 01/27/2018   NA 139 01/30/2018   K 4.8 01/30/2018   CL 106 01/30/2018   CREATININE 1.05 (H) 01/30/2018   BUN 33 (H) 01/30/2018   CO2 25 01/30/2018   TSH 2.26 01/27/2018   INR 1.63 12/29/2017   HGBA1C 5.6 01/27/2018       Assessment & Plan:   Problem List Items Addressed This Visit    Acute diastolic CHF (congestive heart failure) (H[facility])    No evidence of volume overload today.  Recheck metabolic panel.  Will have her start torsemide qod.  Follow renal function and volume status. Has f/u scheduled with cardiology.        Anemia    Has iron deficient anemia.  hgb recent 10.2.  Intolerant to oral iron.  Discussed with GI given  one recent hemoccult card positive.  Having nose bleeds.  After discussion with GI, it was decided to pursue ENT and hematology evaluation  first.  Follow cbc.  If persistent issues, will need f/u with GI - UGI and CT colon.        Relevant Orders   Ambulatory referral to Hematology   Autoimmune hepatitis ([place])    Followed by GI.  Recent liver function tests wnl.        Back pain    Previously saw Dr Sharlet Salina.  On tramadol.  Given need for long term tramadol, will refer to pain clinic.        Relevant Medications   traMADol (ULTRAM) 50 MG tablet   Other Relevant Orders   Ambulatory referral to Pain Clinic   GERD (gastroesophageal reflux disease)    Controlled on current regimen.  Follow.        Hypercholesterolemia    Unable to take statin medication secondary to her underlying liver issues.  Follow lipid panel.        Hypertension    Blood pressure under good control.  Continue same medication regimen.  Follow pressures.  Follow metabolic panel.        Hypothyroidism    On thyroid replacement.  Follow tsh.        Persistent atrial fibrillation    Rated controlled.  Followed by cardiology.  Remains on xarelto.        Renal insufficiency    Elevated creatinine/decreased GFR recently.  Avoid antiinflammatories.  Adjust torsemide.  Recheck metabolic panel.        Other Visit Diagnoses    Epistaxis    -  Primary   Relevant Orders   Ambulatory referral to ENT   Suprapubic abdominal burning sensation       Check urine and culture to confirm no infection.  Wants to hold on further w/up.  Follow.     Relevant Orders   Urinalysis, Routine w reflex microscopic (Completed)   Dysuria       Relevant Orders   Urine Culture (Completed)   Decreased GFR       Relevant Orders   Basic metabolic panel (Completed)   Encounter for immunization       Relevant Orders   Flu vaccine HIGH DOSE PF (Completed)       Ruth Pheasant, MD

## 2018-01-30 NOTE — Telephone Encounter (Signed)
-----   Message from Jolaine Artist, MD sent at 01/29/2018  4:09 PM EST ----- Regarding: RE: question and update.   Agree. Torsemide every other day is good plan!  ----- Message ----- From: [name], MD Sent: 01/29/2018   2:35 PM EST To: Jolaine Artist, MD, Einar Pheasant, MD Subject: RE: question and update.                       I think torsemide every other day is reasonable.  However, her condition is really concerning given that she did this before in the setting of significant volume overload that required milrinone and intravenous diuresis.  It is possible she is having cardiorenal syndrome again. I suggest that she keeps her appointment with Dr. Haroldine Laws on December 4 as she might require a repeat right heart catheterization to evaluate her volume status. Thanks.     ----- Message ----- From: Einar Pheasant, MD Sent: 01/29/2018   4:56 AM EST To: [name], MD Subject: question and update.                           I am scheduled to see Ms Lafevers tomorrow (01/30/18) for a regular follow up appointment.  I obtained labs prior to her appointment.  Her BUN and creatinine have worsened.  In review, you recently held her torsemide and kidney function improved.  She started having increased lower extremity swelling, so was placed back on the torsemide.  I have asked her to hold the torsemide for now.  I plan to recheck her kidney function in the next several days.  I will reassess her volume status at her appointment.  May discuss with her regarding possibly adjusting the dose to every other day.  I am also going to have her follow up with GI regarding the iron deficient anemia.  I wanted to give you an update and see if you had any other suggestions.    Thank you for your help.  Einar Pheasant

## 2018-01-31 ENCOUNTER — Telehealth: Payer: Self-pay | Admitting: Internal Medicine

## 2018-01-31 LAB — BASIC METABOLIC PANEL
BUN / CREAT RATIO: 31 (calc) — AB (ref 6–22)
BUN: 33 mg/dL — ABNORMAL HIGH (ref 7–25)
CHLORIDE: 106 mmol/L (ref 98–110)
CO2: 25 mmol/L (ref 20–32)
Calcium: 8.7 mg/dL (ref 8.6–10.4)
Creat: 1.05 mg/dL — ABNORMAL HIGH (ref 0.60–0.88)
GLUCOSE: 87 mg/dL (ref 65–99)
Potassium: 4.8 mmol/L (ref 3.5–5.3)
Sodium: 139 mmol/L (ref 135–146)

## 2018-01-31 LAB — URINE CULTURE
MICRO NUMBER: 91411469
SPECIMEN QUALITY: ADEQUATE

## 2018-01-31 LAB — URINALYSIS, ROUTINE W REFLEX MICROSCOPIC
BILIRUBIN URINE: NEGATIVE
Glucose, UA: NEGATIVE
Hgb urine dipstick: NEGATIVE
KETONES UR: NEGATIVE
Leukocytes, UA: NEGATIVE
NITRITE: NEGATIVE
PROTEIN: NEGATIVE
SPECIFIC GRAVITY, URINE: 1.018 (ref 1.001–1.03)
pH: 6.5 (ref 5.0–8.0)

## 2018-01-31 NOTE — Telephone Encounter (Signed)
-----   Message from Eustace Pen sent at 01/30/2018  2:59 PM EST ----- Regarding: RE: f/u appt with GI Just spoke to Rhonda-Dr. Marton Redwood nurse.She is going to show him her labs. She states that her hemoglobin has improved from the lab that she had in October.  Thanks Melissa ----- Message ----- From: Einar Pheasant, MD Sent: 01/30/2018   5:01 AM EST To: Eustace Pen Subject: f/u appt with GI                               Pt sees GI ([facility]).  With persistent iron deficient anemia, needs f/u appt with them.  Can an appt be scheduled.  Do you need another referral?  Just let me know if I need to do anything.   Dr Nicki Reaper

## 2018-01-31 NOTE — Telephone Encounter (Signed)
Spoke to GI about stool cards positive and iron deficient anemia.  With nose bleeds.  Discussed with pt.  Discussed referral to ENT and hematology.  Discussed with Dr [name].  Will pursue f/u with ENT and hematology.  He prefers this and then can follow.  If persistent issues, may need CT colon and UGI.

## 2018-02-08 ENCOUNTER — Encounter: Payer: Self-pay | Admitting: Internal Medicine

## 2018-02-08 NOTE — Assessment & Plan Note (Signed)
Previously saw Dr Sharlet Salina.  On tramadol.  Given need for long term tramadol, will refer to pain clinic.

## 2018-02-08 NOTE — Assessment & Plan Note (Signed)
Has iron deficient anemia.  hgb recent 10.2.  Intolerant to oral iron.  Discussed with GI given one recent hemoccult card positive.  Having nose bleeds.  After discussion with GI, it was decided to pursue ENT and hematology evaluation first.  Follow cbc.  If persistent issues, will need f/u with GI - UGI and CT colon.

## 2018-02-08 NOTE — Assessment & Plan Note (Signed)
Followed by GI.  Recent liver function tests wnl.

## 2018-02-08 NOTE — Assessment & Plan Note (Signed)
Elevated creatinine/decreased GFR recently.  Avoid antiinflammatories.  Adjust torsemide.  Recheck metabolic panel.

## 2018-02-08 NOTE — Assessment & Plan Note (Signed)
No evidence of volume overload today.  Recheck metabolic panel.  Will have her start torsemide qod.  Follow renal function and volume status. Has f/u scheduled with cardiology.

## 2018-02-08 NOTE — Assessment & Plan Note (Signed)
Unable to take statin medication secondary to her underlying liver issues.  Follow lipid panel.

## 2018-02-08 NOTE — Assessment & Plan Note (Signed)
Rated controlled.  Followed by cardiology.  Remains on xarelto.

## 2018-02-08 NOTE — Assessment & Plan Note (Signed)
Controlled on current regimen.  Follow.  

## 2018-02-08 NOTE — Assessment & Plan Note (Signed)
Blood pressure under good control.  Continue same medication regimen.  Follow pressures.  Follow metabolic panel.   

## 2018-02-08 NOTE — Assessment & Plan Note (Signed)
On thyroid replacement.  Follow tsh.  

## 2018-02-09 ENCOUNTER — Other Ambulatory Visit: Payer: Self-pay | Admitting: Internal Medicine

## 2018-02-09 ENCOUNTER — Other Ambulatory Visit (HOSPITAL_COMMUNITY): Payer: Self-pay | Admitting: Student

## 2018-02-09 ENCOUNTER — Other Ambulatory Visit (HOSPITAL_COMMUNITY): Payer: Self-pay | Admitting: Internal Medicine

## 2018-02-11 ENCOUNTER — Ambulatory Visit (HOSPITAL_COMMUNITY)
Admission: RE | Admit: 2018-02-11 | Discharge: 2018-02-11 | Disposition: A | Discharge status: Home / Self Care | Payer: Medicare Other | Source: Ambulatory Visit | Attending: Internal Medicine | Admitting: Internal Medicine

## 2018-02-11 ENCOUNTER — Encounter (HOSPITAL_COMMUNITY): Payer: Self-pay | Admitting: Internal Medicine

## 2018-02-11 ENCOUNTER — Other Ambulatory Visit: Payer: Self-pay

## 2018-02-11 VITALS — BP 150/68 | HR 73 | Wt 137.2 lb

## 2018-02-11 DIAGNOSIS — I340 Nonrheumatic mitral (valve) insufficiency: Secondary | ICD-10-CM | POA: Diagnosis not present

## 2018-02-11 DIAGNOSIS — Z79899 Other long term (current) drug therapy: Secondary | ICD-10-CM | POA: Insufficient documentation

## 2018-02-11 DIAGNOSIS — I4819 Other persistent atrial fibrillation: Secondary | ICD-10-CM | POA: Diagnosis not present

## 2018-02-11 DIAGNOSIS — Z7901 Long term (current) use of anticoagulants: Secondary | ICD-10-CM | POA: Insufficient documentation

## 2018-02-11 DIAGNOSIS — I491 Atrial premature depolarization: Secondary | ICD-10-CM | POA: Diagnosis not present

## 2018-02-11 DIAGNOSIS — N179 Acute kidney failure, unspecified: Secondary | ICD-10-CM | POA: Insufficient documentation

## 2018-02-11 DIAGNOSIS — I4892 Unspecified atrial flutter: Secondary | ICD-10-CM | POA: Diagnosis not present

## 2018-02-11 DIAGNOSIS — I110 Hypertensive heart disease with heart failure: Secondary | ICD-10-CM | POA: Diagnosis not present

## 2018-02-11 DIAGNOSIS — Z888 Allergy status to other drugs, medicaments and biological substances status: Secondary | ICD-10-CM | POA: Diagnosis not present

## 2018-02-11 DIAGNOSIS — Z79890 Hormone replacement therapy (postmenopausal): Secondary | ICD-10-CM | POA: Diagnosis not present

## 2018-02-11 DIAGNOSIS — I5032 Chronic diastolic (congestive) heart failure: Secondary | ICD-10-CM

## 2018-02-11 DIAGNOSIS — I5033 Acute on chronic diastolic (congestive) heart failure: Secondary | ICD-10-CM | POA: Insufficient documentation

## 2018-02-11 DIAGNOSIS — H409 Unspecified glaucoma: Secondary | ICD-10-CM | POA: Diagnosis not present

## 2018-02-11 DIAGNOSIS — E039 Hypothyroidism, unspecified: Secondary | ICD-10-CM | POA: Insufficient documentation

## 2018-02-11 DIAGNOSIS — K279 Peptic ulcer, site unspecified, unspecified as acute or chronic, without hemorrhage or perforation: Secondary | ICD-10-CM | POA: Insufficient documentation

## 2018-02-11 LAB — BRAIN NATRIURETIC PEPTIDE: B Natriuretic Peptide: 713.5 pg/mL — ABNORMAL HIGH (ref 0.0–100.0)

## 2018-02-11 LAB — BASIC METABOLIC PANEL
Anion gap: 9 (ref 5–15)
BUN: 38 mg/dL — ABNORMAL HIGH (ref 8–23)
CALCIUM: 8.4 mg/dL — AB (ref 8.9–10.3)
CO2: 23 mmol/L (ref 22–32)
Chloride: 107 mmol/L (ref 98–111)
Creatinine, Ser: 1.25 mg/dL — ABNORMAL HIGH (ref 0.44–1.00)
GFR calc Af Amer: 45 mL/min — ABNORMAL LOW (ref >60)
GFR, EST NON AFRICAN AMERICAN: 39 mL/min — AB (ref >60)
Glucose, Bld: 66 mg/dL — ABNORMAL LOW (ref 70–99)
POTASSIUM: 5 mmol/L (ref 3.5–5.1)
Sodium: 139 mmol/L (ref 135–145)

## 2018-02-11 MED ORDER — TORSEMIDE 20 MG PO TABS: 20.0000 mg | ORAL_TABLET | Freq: Every day | ORAL | 3 refills | Status: DC

## 2018-02-11 NOTE — Progress Notes (Addendum)
Advanced Heart Failure Clinic Note   PCP: Einar Pheasant, MD PCP-Cardiologist: [name], MD   HPI: Ruth Roth is a 84 y.o. female with a history of 84 year old female with a history of coronary artery disease, chronic diastolic congestive heart failure, persistent atrial fibrillation, hypertension, peptic ulcer disease, fibrocystic breast disease, inflammatory arthritis, autoimmune hepatitis, and neuropathy.  Admitted to [facility] 08/21/17 s/p R/LHC which showed significantly elevated filling pressures, severe MR, and low cardiac output, and then transferred to [facility] 6/14 for consideration of MitraClip.  AKI noted on admission felt to be due to cardiorenal syndrome. She required milrinone and lasix for diuresis. Diuresed 25 lbs and transitioned to 20 mg torsemide daily. Case and images reviewed at length with structural heart team. MR felt to be truly functional with improvement from hemodynamic optimization. Not felt to be MitraClip candidate at this time. Discharge weight: 121 lbs.   She presents today for regular follow up.  Last visit referred to cardiac rehab. Seen by Dr. Rayann Heman in the interim, and not thought to be candidate for AF ablation. Has not started CR yet due to a fall. Has orientation next week. Had an episode of vertigo in 9/19. [place] [address] and hit her head. CT head no bleed. No falls. No vertigo. But does get a little headedness when she stands. Hasn't happened for a few weeks. Says she just doesn't feel good. Last month had an episode of AKI with creatinine of 2.2. Now on torsemide 20 qod. Creatinine 11/22 was back to 1.05. Weight back up to 137. (Baseline ~120). + exertional fatigue. Swelling in legs and belly. + bendopnea.   Cardiac Studies: Echo 10/09/17: EF 55-60%, no AI, mild to mod MR, LA moderately dilated, RA mildly dilated, RV normal, PA peak pressure 52 mmHg  [facility] 08/21/2017 - Mild non-obstructive CAD RHC Procedural Findings: (Obtained from Scan 08/21/17 at 1030) Hemodynamics  (mmHg) RA mean 17 RV 68/11 (19) PA 62/27 (44) PCWP 41  AO 147/67 (102) Cardiac Output (Fick) 2.45 Cardiac Index  (Fick) 1.46 PVR 1.23 WU  Echo 08/01/17 LVEF 50-55%, Mild AS, Mod MR, Mod LAE, Mild RAE, Mod TR, PA peak pressure 65 mm Hg.MR ++ calcified, and RV mild/moderately reduced function.  Review of systems complete and found to be negative unless listed in HPI.    Past Medical History:  Diagnosis Date  . (HFpEF) heart failure with preserved ejection fraction ([place])    a. 07/2017 Echo: EF 50-55%, no rwma, mild AS/AI, mod MR, mod dil LA, mod TR, PASP 68mmHg.  . Autoimmune hepatitis ([place])    followed by Dr [name]  . Fibrocystic breast disease   . Glaucoma   . Hypercholesterolemia   . Hypertension   . Hypothyroidism   . Inflammatory arthritis   . MI (myocardial infarction) ([place])   . Neuropathy   . Non-obstructive Coronary artery disease    a. 05/2014 NSTEMI/Cath: LM nl, LAD mild dzs, LCX 60ost hazy (? culprit), RCA mild dzs. EF 60% by echo-->Med Rx; b. 08/2017 Cath: LM 30ost, LAD min irregs, LCX small, 40ost/p, RCA large, min irregs, RPDA/RPL1 nl, EF 50-55%. 4+MR.  . Osteoarthritis   . Permanent atrial fibrillation    a. CHA2DS2VASc = 6-->Xarelto.  . 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 Severe mitral regurgitation    a. 07/2017 Echo: Mod MR; b. 08/2017 Cath: 4+/Severe MR.    Current Outpatient Medications  Medication Sig Dispense Refill  . brimonidine-timolol (COMBIGAN) 0.2-0.5 %  ophthalmic solution Place 1 drop into the left eye 2 (two) times daily.     . dorzolamide (TRUSOPT) 2 % ophthalmic solution Place 1 drop into the left eye 2 (two) times daily.    . fluticasone (FLONASE) 50 MCG/ACT nasal spray Place 2 sprays into both nostrils daily as needed for allergies.     Marland Kitchen gabapentin (NEURONTIN) 300 MG capsule TAKE 2 CAPSULES BY MOUTH FOUR TIMES DAILY 720 capsule 0  . ipratropium (ATROVENT) 0.06 % nasal spray Place 2  sprays into both nostrils daily as needed for rhinitis.     Marland Kitchen latanoprost (XALATAN) 0.005 % ophthalmic solution Place 1 drop into both eyes at bedtime.     Marland Kitchen levothyroxine (SYNTHROID, LEVOTHROID) 125 MCG tablet Take 1 tablet (125 mcg total) by mouth daily before breakfast. 90 tablet 0  . metoprolol tartrate (LOPRESSOR) 50 MG tablet TAKE 1 TABLET(50 MG) BY MOUTH TWICE DAILY 180 tablet 3  . pantoprazole (PROTONIX) 40 MG tablet Take 1 tablet (40 mg total) by mouth every evening. 90 tablet 3  . ranitidine (ZANTAC) 150 MG tablet TAKE 1 TABLET(150 MG) BY MOUTH DAILY 30 MINUTES BEFORE BREAKFAST 30 tablet 0  . Rivaroxaban (XARELTO) 15 MG TABS tablet Take 1 tablet (15 mg total) by mouth daily with supper. 30 tablet 6  . torsemide (DEMADEX) 20 MG tablet TAKE 1 TABLET BY MOUTH DAILY, CAN TAKE EXTRA TABLET FOR WEIGHT GAIN OF 3 POUNDS OVERNIGHT OR 5 POUNDS WITHIN 1 WEEK 180 tablet 1  . traMADol (ULTRAM) 50 MG tablet Take 1 tablet (50 mg total) by mouth daily as needed (for pain.). 30 tablet 0  . ursodiol (ACTIGALL) 300 MG capsule Take 300-600 mg by mouth See admin instructions. Take 2 capsules (600 mg) daily after lunch & 1 capsule (300 mg) at night.     No current facility-administered medications for this encounter.     Allergies  Allergen Reactions  . Statins Other (See Comments)    Affect her liver, Caused liver problems   . Haldol [Haloperidol Lactate] Rash  . Librax [Chlordiazepoxide-Clidinium] Rash  . Plavix [Clopidogrel Bisulfate] Rash  . Ramipril Rash   Social History   Socioeconomic History  . Marital status: Widowed    Spouse name: Not on file  . Number of children: Not on file  . Years of education: Not on file  . Highest education level: Not on file  Occupational History  . Not on file  Social Needs  . Financial resource strain: Not on file  . Food insecurity:    Worry: Not on file    Inability: Not on file  . Transportation needs:    Medical: Not on file    Non-medical: Not  on file  Tobacco Use  . Smoking status: Never Smoker  . Smokeless tobacco: Never Used  Substance and Sexual Activity  . Alcohol use: No    Alcohol/week: 0.0 standard drinks  . Drug use: No  . Sexual activity: Never  Lifestyle  . Physical activity:    Days per week: Not on file    Minutes per session: Not on file  . Stress: Not on file  Relationships  . Social connections:    Talks on phone: Not on file    Gets together: Not on file    Attends religious service: Not on file    Active member of club or organization: Not on file    Attends meetings of clubs or organizations: Not on file    Relationship status: Not  on file  . Intimate partner violence:    Fear of current or ex partner: Not on file    Emotionally abused: Not on file    Physically abused: Not on file    Forced sexual activity: Not on file  Other Topics Concern  . Not on file  Social History Narrative  . Not on file   Family History  Problem Relation Age of Onset  . Stroke Mother   . Esophageal cancer Brother        also had lung cancer  . Cancer Daughter        colon  . Ovarian cancer Daughter   . Breast cancer Neg Hx   . Colon cancer Neg Hx    Vitals:   02/11/18 1146  BP: (!) 150/68  Pulse: 73  SpO2: 97%  Weight: 62.2 kg (137 lb 3.2 oz)     Wt Readings from Last 3 Encounters:  01/30/18 61.5 kg (135 lb 9.6 oz)  12/25/17 60.7 kg (133 lb 12 oz)  12/01/17 57.2 kg (126 lb 3.2 oz)   PHYSICAL EXAM: General:  Elderly appearing. No resp difficulty HEENT: normal Neck: supple. JVP 9-10 placed. Regular rate & rhythm. 3/6 MR Lungs: + basilar crackles Abdomen: soft, nontender, nondistended. No hepatosplenomegaly. No bruits or masses. Good bowel sounds. Extremities: no cyanosis, clubbing, rash, 2+ edema Neuro: alert & orientedx3, cranial nerves grossly intact. moves all 4 extremities w/o difficulty. Affect pleasant   ASSESSMENT & PLAN:  1.Acute on chronic diastolic congestive heart failure with low  output: - Echo 08/01/17 LVEF 50-55%, Mild AS, Mod MR, Mod LAE, Mild RAE, Mod TR, RV moderately HK PA peak pressure 65 mm Hg. - Cath 08/21/17 with signficantly elevated filling pressures, severe MR, and low cardiac output. Had cardiorenal syndrome on admit with severe MR and RV failure and required milrinone - Echo 10/09/17: EF 55-60%, mild to mod MR - Recent episode of AKI with overdiuresis so torsemide cut back to 20 qod - NYHA III-IIIB in setting of recurrent volume overload - Volume overloaded today with weight up 15 pounds - Torsemide 40 daily x 2 days then 20 daily (ratther than qod) - F/u next week with labs - Lopressor recently cut back to 50 bid as she was back in NSR - Start cardiac rehab.   2. Severe mitral regurgitation: - Echo 08/01/17 LVEF 50-55%, Mild AS, Mod MR, Mod LAE, Mild RAE, Mod TR, PA peak pressure 65 mm Hg.MR ++ calcified, and RV mild/moderately reduced function. - TEE 6/17 showed only mild MR with restricted posterior leaflet. Dr Haroldine Laws reviewed with structural heart team.  - Likely functional MR and MR improved with hemodynamic optimization.  Will follow closely if fails close medical therapy consider MitraClip - Echo 10/09/17: EF 55-60%, mild to mod MR  3. Persistent atrial fibrillation: - Has previously failed amiodarone and DCCV x3.  - Back in NSR. Repeat ECG to confirm - Lopressor now down  50 bid.  - Continue Xarelto. Denies bleeding - Seen by Dr. Rayann Heman 12/01/17. He does not recommend either afib ablation or AV node ablation + PPM. Recommends continuing cardiac rehab.    4. Essential hypertension: - Stable.   5. Recurrent AKI - Cr stable 01/30/18 after recent AKI. Will recheck. Watch closely with diuresis   Glori Bickers, MD  12:18 PM

## 2018-02-11 NOTE — Patient Instructions (Signed)
Labs done today  EKG done today  INCREASE Torsemide 20mg  (1 tab) daily. Today and tomorrow ONLY: take 40mg  (2 tabs).  Follow up with our Advance Heart Failure team in 1 week

## 2018-02-11 NOTE — Progress Notes (Signed)
ReDS Vest - 02/11/18 1200      ReDS Vest   MR   Moderate    Fitting Posture  Sitting    Height Marker  Short   station A   Ruler Value  26    Center Strip  Aligned    ReDS Value  29

## 2018-02-13 ENCOUNTER — Ambulatory Visit: Payer: Medicare Other

## 2018-02-16 ENCOUNTER — Other Ambulatory Visit: Payer: Self-pay

## 2018-02-16 ENCOUNTER — Encounter: Payer: Medicare Other | Attending: Internal Medicine

## 2018-02-16 ENCOUNTER — Ambulatory Visit: Payer: Medicare Other

## 2018-02-16 VITALS — Ht 61.5 in | Wt 132.2 lb

## 2018-02-16 DIAGNOSIS — I5032 Chronic diastolic (congestive) heart failure: Secondary | ICD-10-CM | POA: Insufficient documentation

## 2018-02-16 NOTE — Patient Instructions (Signed)
Patient Instructions  Patient Details  Name: Ruth Roth MRN: [id number] Date of Birth: [birth year] Referring Provider:  Jolaine Artist, MD Below are your personal goals for exercise, nutrition, and risk factors. Our goal is to help you stay on track towards obtaining and maintaining these goals. We will be discussing your progress on these goals with you throughout the program. Initial Exercise Prescription: Initial Exercise Prescription - 02/16/18 1400      Date of Initial Exercise RX and Referring Provider   Date  02/16/18    Referring Provider  Glori Bickers MD      Treadmill   MPH  1.7    Grade  0.5    Minutes  15    METs  2.42      Recumbant Bike   Level  1    RPM  50    Minutes  15    METs  1.5      NuStep   Level  1    SPM  80    Minutes  15    METs  1.5      Prescription Details   Frequency (times per week)  3    Duration  Progress to 45 minutes of aerobic exercise without signs/symptoms of physical distress      Intensity   THRR 40-80% of Max Heartrate  --   92-119   Ratings of Perceived Exertion  11-13    Perceived Dyspnea  0-4      Progression   Progression  Continue to progress workloads to maintain intensity without signs/symptoms of physical distress.      Resistance Training   Training Prescription  Yes    Weight  3 lbs    Reps  10-15      Exercise Goals: Frequency: Be able to perform aerobic exercise two to three times per week in program working toward 2-5 days per week of home exercise.  Intensity: Work with a perceived exertion of 11 (fairly light) - 15 (hard) while following your exercise prescription.  We will make changes to your prescription with you as you progress through the program.   Duration: Be able to do 30 to 45 minutes of continuous aerobic exercise in addition to a 5 minute warm-up and a 5 minute cool-down routine.   Nutrition Goals: Your personal nutrition goals will be established when you do your nutrition  analysis with the dietician.  The following are general nutrition guidelines to follow: Cholesterol < 200mg /day Sodium < 1500mg /day Fiber: Women over 50 yrs - 21 grams per day  Personal Goals: Personal Goals and Risk Factors at Admission - 02/16/18 1427      Core Components/Risk Factors/Patient Goals on Admission    Weight Management  Yes;Weight Loss    Intervention  Weight Management: Develop a combined nutrition and exercise program designed to reach desired caloric intake, while maintaining appropriate intake of nutrient and fiber, sodium and fats, and appropriate energy expenditure required for the weight goal.;Weight Management: Provide education and appropriate resources to help participant work on and attain dietary goals.    Admit Weight  132 lb (59.9 kg)    Goal Weight: Short Term  127 lb (57.6 kg)    Goal Weight: Long Term  120 lb (54.4 kg)    Expected Outcomes  Short Term: Continue to assess and modify interventions until short term weight is achieved;Long Term: Adherence to nutrition and physical activity/exercise program aimed toward attainment of established weight goal;Weight Loss: Understanding of general  recommendations for a balanced deficit meal plan, which promotes 1-2 lb weight loss per week and includes a negative energy balance of [phone] kcal/d;Understanding recommendations for meals to include 15-35% energy as protein, 25-35% energy from fat, 35-60% energy from carbohydrates, less than 200mg  of dietary cholesterol, 20-35 gm of total fiber daily;Understanding of distribution of calorie intake throughout the day with the consumption of 4-5 meals/snacks    Improve shortness of breath with ADL's  Yes    Intervention  Provide education, individualized exercise plan and daily activity instruction to help decrease symptoms of SOB with activities of daily living.    Expected Outcomes  Short Term: Improve cardiorespiratory fitness to achieve a reduction of symptoms when performing  ADLs;Long Term: Be able to perform more ADLs without symptoms or delay the onset of symptoms    Hypertension  Yes    Intervention  Provide education on lifestyle modifcations including regular physical activity/exercise, weight management, moderate sodium restriction and increased consumption of fresh fruit, vegetables, and low fat dairy, alcohol moderation, and smoking cessation.;Monitor prescription use compliance.    Expected Outcomes  Short Term: Continued assessment and intervention until BP is < 140/80mm HG in hypertensive participants. < 130/48mm HG in hypertensive participants with diabetes, heart failure or chronic kidney disease.;Long Term: Maintenance of blood pressure at goal levels.      Tobacco Use Initial Evaluation: Social History   Tobacco Use  Smoking Status Never Smoker  Smokeless Tobacco Never Used   Exercise Goals and Review: Exercise Goals    Row Name 02/16/18 1447             Exercise Goals   Increase Physical Activity  Yes       Intervention  Provide advice, education, support and counseling about physical activity/exercise needs.;Develop an individualized exercise prescription for aerobic and resistive training based on initial evaluation findings, risk stratification, comorbidities and participant's personal goals.       Expected Outcomes  Short Term: Attend rehab on a regular basis to increase amount of physical activity.;Long Term: Add in home exercise to make exercise part of routine and to increase amount of physical activity.;Long Term: Exercising regularly at least 3-5 days a week.       Increase Strength and Stamina  Yes       Intervention  Provide advice, education, support and counseling about physical activity/exercise needs.;Develop an individualized exercise prescription for aerobic and resistive training based on initial evaluation findings, risk stratification, comorbidities and participant's personal goals.       Expected Outcomes  Short Term:  Increase workloads from initial exercise prescription for resistance, speed, and METs.;Long Term: Improve cardiorespiratory fitness, muscular endurance and strength as measured by increased METs and functional capacity (6MWT);Short Term: Perform resistance training exercises routinely during rehab and add in resistance training at home       Able to understand and use rate of perceived exertion (RPE) scale  Yes       Intervention  Provide education and explanation on how to use RPE scale       Expected Outcomes  Short Term: Able to use RPE daily in rehab to express subjective intensity level;Long Term:  Able to use RPE to guide intensity level when exercising independently       Able to understand and use Dyspnea scale  Yes       Intervention  Provide education and explanation on how to use Dyspnea scale       Expected Outcomes  Short Term: Able  to use Dyspnea scale daily in rehab to express subjective sense of shortness of breath during exertion;Long Term: Able to use Dyspnea scale to guide intensity level when exercising independently       Knowledge and understanding of Target Heart Rate Range (THRR)  Yes       Intervention  Provide education and explanation of THRR including how the numbers were predicted and where they are located for reference       Expected Outcomes  Short Term: Able to state/look up THRR;Short Term: Able to use daily as guideline for intensity in rehab;Long Term: Able to use THRR to govern intensity when exercising independently       Able to check pulse independently  Yes       Intervention  Provide education and demonstration on how to check pulse in carotid and radial arteries.;Review the importance of being able to check your own pulse for safety during independent exercise       Expected Outcomes  Short Term: Able to explain why pulse checking is important during independent exercise;Long Term: Able to check pulse independently and accurately       Understanding of Exercise  Prescription  Yes       Intervention  Provide education, explanation, and written materials on patient's individual exercise prescription       Expected Outcomes  Short Term: Able to explain program exercise prescription;Long Term: Able to explain home exercise prescription to exercise independently

## 2018-02-16 NOTE — Progress Notes (Signed)
Daily Session Note  Patient Details  Name: Ruth Roth MRN: [id number] Date of Birth: October 25, 1928 Referring Provider:     Pulmonary Rehab from 02/16/2018 in [facility]  Referring Provider  Glori Bickers MD      Encounter Date: 02/16/2018  Check In: Session Check In - 02/16/18 1353      Check-In   Supervising physician immediately available to respond to emergencies  [name] immediately available ER MD    Physician(s)  Dr. Kerman Passey and Quentin Cornwall    Location  [facility]-Cardiac & Pulmonary Rehab    Staff Present  Justin Mend Lorre Nick, MA, RCEP, CCRP, Exercise Physiologist    Medication changes reported      No    Fall or balance concerns reported     No    Warm-up and Cool-down  Not performed (comment)   Medical Evaluation   Resistance Training Performed  No    VAD Patient?  No    PAD/SET Patient?  No      Pain Assessment   Currently in Pain?  No/denies        Exercise Prescription Changes - 02/16/18 1400      Response to Exercise   Blood Pressure (Admit)  128/72    Blood Pressure (Exercise)  132/72    Blood Pressure (Exit)  128/70    Heart Rate (Admit)  64 bpm    Heart Rate (Exercise)  88 bpm    Heart Rate (Exit)  67 bpm    Oxygen Saturation (Admit)  96 %    Oxygen Saturation (Exercise)  93 %    Oxygen Saturation (Exit)  96 %    Rating of Perceived Exertion (Exercise)  11    Perceived Dyspnea (Exercise)  0    Symptoms  leg fatigue/pain bilateral back of both legs    Comments  walk test results       Social History   Tobacco Use  Smoking Status Never Smoker  Smokeless Tobacco Never Used    Goals Met:  Exercise tolerated well Personal goals reviewed Queuing for purse lip breathing No report of cardiac concerns or symptoms  Goals Unmet:  Not Applicable  Comments:  6 Minute Walk    Row Name 02/16/18 1442         6 Minute Walk   Phase  Initial     Distance  830 feet     Walk Time  4.55 minutes     # of  Rest Breaks  1 1:27     MPH  2.07     METS  1.05     RPE  11     Perceived Dyspnea   0     VO2 Peak  3.67     Symptoms  Yes (comment)     Comments  leg fatigued/painful from hips down back of both legs     Resting HR  64 bpm     Resting BP  128/72     Resting Oxygen Saturation   96 %     Exercise Oxygen Saturation  during 6 min walk  93 %     Max Ex. HR  88 bpm     Max Ex. BP  132/72     2 Minute Post BP  128/70       Interval HR   1 Minute HR  78     2 Minute HR  84     3 Minute HR  87  4 Minute HR  81     5 Minute HR  75     6 Minute HR  88     2 Minute Post HR  67     Interval Heart Rate?  Yes       Interval Oxygen   Interval Oxygen?  Yes     Baseline Oxygen Saturation %  96 %     1 Minute Oxygen Saturation %  93 %     1 Minute Liters of Oxygen  0 L Room Air     2 Minute Oxygen Saturation %  93 %     2 Minute Liters of Oxygen  0 L     3 Minute Oxygen Saturation %  94 % rest 3:33-5:00     3 Minute Liters of Oxygen  0 L     4 Minute Oxygen Saturation %  94 %     4 Minute Liters of Oxygen  0 L     5 Minute Oxygen Saturation %  94 %     5 Minute Liters of Oxygen  0 L     6 Minute Oxygen Saturation %  96 %     6 Minute Liters of Oxygen  0 L     2 Minute Post Oxygen Saturation %  96 %     2 Minute Post Liters of Oxygen  0 L       Service TIme [id number]   Dr. Emily Filbert is Medical Director for [place] and [name].

## 2018-02-16 NOTE — Progress Notes (Signed)
Pulmonary Individual Treatment Plan  Patient Details  Name: Ruth Roth MRN: [id number] Date of Birth: 09-Feb-1929 Referring Provider:     Pulmonary Rehab from 02/16/2018 in [facility] Cardiac and Pulmonary Rehab  Referring Provider  Glori Bickers MD      Initial Encounter Date:    Pulmonary Rehab from 02/16/2018 in [facility] Cardiac and Pulmonary Rehab  Date  02/16/18      Visit Diagnosis: Chronic diastolic heart failure ([place])  Patient's Home Medications on Admission:  Current Outpatient Medications:  .  brimonidine-timolol (COMBIGAN) 0.2-0.5 % ophthalmic solution, Place 1 drop into the left eye 2 (two) times daily. , Disp: , Rfl:  .  dorzolamide (TRUSOPT) 2 % ophthalmic solution, Place 1 drop into the left eye 2 (two) times daily., Disp: , Rfl:  .  fluticasone (FLONASE) 50 MCG/ACT nasal spray, Place 2 sprays into both nostrils daily as needed for allergies. , Disp: , Rfl:  .  gabapentin (NEURONTIN) 300 MG capsule, TAKE 2 CAPSULES BY MOUTH FOUR TIMES DAILY, Disp: 720 capsule, Rfl: 0 .  ipratropium (ATROVENT) 0.06 % nasal spray, Place 2 sprays into both nostrils daily as needed for rhinitis. , Disp: , Rfl:  .  latanoprost (XALATAN) 0.005 % ophthalmic solution, Place 1 drop into both eyes at bedtime. , Disp: , Rfl:  .  levothyroxine (SYNTHROID, LEVOTHROID) 125 MCG tablet, Take 1 tablet (125 mcg total) by mouth daily before breakfast., Disp: 90 tablet, Rfl: 0 .  metoprolol tartrate (LOPRESSOR) 50 MG tablet, TAKE 1 TABLET(50 MG) BY MOUTH TWICE DAILY, Disp: 180 tablet, Rfl: 3 .  pantoprazole (PROTONIX) 40 MG tablet, Take 1 tablet (40 mg total) by mouth every evening., Disp: 90 tablet, Rfl: 3 .  ranitidine (ZANTAC) 150 MG tablet, TAKE 1 TABLET(150 MG) BY MOUTH DAILY 30 MINUTES BEFORE BREAKFAST, Disp: 30 tablet, Rfl: 0 .  Rivaroxaban (XARELTO) 15 MG TABS tablet, Take 1 tablet (15 mg total) by mouth daily with supper., Disp: 30 tablet, Rfl: 6 .  torsemide (DEMADEX) 20 MG tablet, Take 1 tablet (20 mg  total) by mouth daily., Disp: 30 tablet, Rfl: 3 .  traMADol (ULTRAM) 50 MG tablet, Take 1 tablet (50 mg total) by mouth daily as needed (for pain.)., Disp: 30 tablet, Rfl: 0 .  ursodiol (ACTIGALL) 300 MG capsule, Take 300-600 mg by mouth See admin instructions. Take 2 capsules (600 mg) daily after lunch & 1 capsule (300 mg) at night., Disp: , Rfl:   Past Medical History: Past Medical History:  Diagnosis Date  . (HFpEF) heart failure with preserved ejection fraction ([place])    a. 07/2017 Echo: EF 50-55%, no rwma, mild AS/AI, mod MR, mod dil LA, mod TR, PASP 40mHg.  . Autoimmune hepatitis ([place]    followed by Dr SGustavo Lah . Fibrocystic breast disease   . Glaucoma   . Hypercholesterolemia   . Hypertension   . Hypothyroidism   . Inflammatory arthritis   . [place]   . Neuropathy   . Non-obstructive Coronary artery disease    a. 05/2014 NSTEMI/Cath: LM nl, LAD mild dzs, LCX 60ost hazy (? culprit), RCA mild dzs. EF 60% by echo-->Med Rx; b. 08/2017 Cath: LM 30ost, LAD min irregs, LCX small, 40ost/p, RCA large, min irregs, RPDA/RPL1 nl, EF 50-55%. 4+MR.  . Osteoarthritis   . Permanent atrial fibrillation    a. CHA2DS2VASc = 6-->Xarelto.  . 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 Severe mitral regurgitation  a. 07/2017 Echo: Mod MR; b. 08/2017 Cath: 4+/Severe MR.    Tobacco Use: Social History   Tobacco Use  Smoking Status Never Smoker  Smokeless Tobacco Never Used    Labs: Recent Review Flowsheet Data    Labs for ITP Cardiac and Pulmonary Rehab Latest Ref Rng & Units 03/20/2016 07/29/2016 01/17/2017 05/27/2017 01/27/2018   Cholestrol 0 - 200 mg/dL 230(H) 215(H) 199 159 144   LDLCALC 0 - 99 mg/dL 140(H) 125(H) 110(H) 74 78   LDLDIRECT mg/dL - - - - -   HDL >39.00 mg/dL 67.30 54.20 67.30 64.30 44.10   Trlycerides 0.0 - 149.0 mg/dL 113.0 178.0(H) 107.0 101.0 107.0   Hemoglobin A1c 4.6 - 6.5 % 5.9 6.0 5.8 5.7 5.6    TCO2 0 - 100 mmol/L - - - - -       Pulmonary Assessment Scores: Pulmonary Assessment Scores    Row Name 11/25/17 1158 02/16/18 1359       ADL UCSD   ADL Phase  Entry  Entry    SOB Score total  75  57    Rest  0  0    Walk  3  2    Stairs  5  5    Bath  5  4    Dress  5  5    Shop  5  4      CAT Score   CAT Score  20  22      mMRC Score   mMRC Score  -  0       Pulmonary Function Assessment: Pulmonary Function Assessment - 02/16/18 1425      Breath   Bilateral Breath Sounds  Clear    Shortness of Breath  Limiting activity;No       Exercise Target Goals: Exercise Program Goal: Individual exercise prescription set using results from initial 6 min walk test and THRR while considering  patient's activity barriers and safety.   Exercise Prescription Goal: Initial exercise prescription builds to 30-45 minutes a day of aerobic activity, 2-3 days per week.  Home exercise guidelines will be given to patient during program as part of exercise prescription that the participant will acknowledge.  Activity Barriers & Risk Stratification: Activity Barriers & Cardiac Risk Stratification - 02/16/18 1444      Activity Barriers & Cardiac Risk Stratification   Activity Barriers  Deconditioning;Muscular Weakness;Balance Concerns;History of Falls;Other (comment);Decreased Ventricular Function    Comments  bilateral leg pain when walking       6 Minute Walk: 6 Minute Walk    Row Name 02/16/18 1442         6 Minute Walk   Phase  Initial     Distance  830 feet     Walk Time  4.55 minutes     # of Rest Breaks  1 1:27     MPH  2.07     METS  1.05     RPE  11     Perceived Dyspnea   0     VO2 Peak  3.67     Symptoms  Yes (comment)     Comments  leg fatigued/painful from hips down back of both legs     Resting HR  64 bpm     Resting BP  128/72     Resting Oxygen Saturation   96 %     Exercise Oxygen Saturation  during 6 min walk  93 %     Max Ex.  HR  88 bpm     Max  Ex. BP  132/72     2 Minute Post BP  128/70       Interval HR   1 Minute HR  78     2 Minute HR  84     3 Minute HR  87     4 Minute HR  81     5 Minute HR  75     6 Minute HR  88     2 Minute Post HR  67     Interval Heart Rate?  Yes       Interval Oxygen   Interval Oxygen?  Yes     Baseline Oxygen Saturation %  96 %     1 Minute Oxygen Saturation %  93 %     1 Minute Liters of Oxygen  0 L Room Air     2 Minute Oxygen Saturation %  93 %     2 Minute Liters of Oxygen  0 L     3 Minute Oxygen Saturation %  94 % rest 3:33-5:00     3 Minute Liters of Oxygen  0 L     4 Minute Oxygen Saturation %  94 %     4 Minute Liters of Oxygen  0 L     5 Minute Oxygen Saturation %  94 %     5 Minute Liters of Oxygen  0 L     6 Minute Oxygen Saturation %  96 %     6 Minute Liters of Oxygen  0 L     2 Minute Post Oxygen Saturation %  96 %     2 Minute Post Liters of Oxygen  0 L       Oxygen Initial Assessment: Oxygen Initial Assessment - 02/16/18 1423      Home Oxygen   Home Oxygen Device  None    Sleep Oxygen Prescription  None    Home Exercise Oxygen Prescription  None    Home at Rest Exercise Oxygen Prescription  None      Initial 6 min Walk   Oxygen Used  None      Program Oxygen Prescription   Program Oxygen Prescription  None      Intervention   Short Term Goals  To learn and understand importance of maintaining oxygen saturations>88%;To learn and demonstrate proper pursed lip breathing techniques or other breathing techniques.;To learn and understand importance of monitoring SPO2 with pulse oximeter and demonstrate accurate use of the pulse oximeter.    Long  Term Goals  Verbalizes importance of monitoring SPO2 with pulse oximeter and return demonstration;Maintenance of O2 saturations>88%;Exhibits proper breathing techniques, such as pursed lip breathing or other method taught during program session       Oxygen Re-Evaluation:   Oxygen Discharge (Final Oxygen  Re-Evaluation):   Initial Exercise Prescription: Initial Exercise Prescription - 02/16/18 1400      Date of Initial Exercise RX and Referring Provider   Date  02/16/18    Referring Provider  Glori Bickers MD      Treadmill   MPH  1.7    Grade  0.5    Minutes  15    METs  2.42      Recumbant Bike   Level  1    RPM  50    Minutes  15    METs  1.5      NuStep   Level  1    SPM  80    Minutes  15    METs  1.5      Prescription Details   Frequency (times per week)  3    Duration  Progress to 45 minutes of aerobic exercise without signs/symptoms of physical distress      Intensity   THRR 40-80% of Max Heartrate  --   92-119   Ratings of Perceived Exertion  11-13    Perceived Dyspnea  0-4      Progression   Progression  Continue to progress workloads to maintain intensity without signs/symptoms of physical distress.      Resistance Training   Training Prescription  Yes    Weight  3 lbs    Reps  10-15       Perform Capillary Blood Glucose checks as needed.  Exercise Prescription Changes: Exercise Prescription Changes    Row Name 02/16/18 1400             Response to Exercise   Blood Pressure (Admit)  128/72       Blood Pressure (Exercise)  132/72       Blood Pressure (Exit)  128/70       Heart Rate (Admit)  64 bpm       Heart Rate (Exercise)  88 bpm       Heart Rate (Exit)  67 bpm       Oxygen Saturation (Admit)  96 %       Oxygen Saturation (Exercise)  93 %       Oxygen Saturation (Exit)  96 %       Rating of Perceived Exertion (Exercise)  11       Perceived Dyspnea (Exercise)  0       Symptoms  leg fatigue/pain bilateral back of both legs       Comments  walk test results          Exercise Comments:   Exercise Goals and Review: Exercise Goals    Row Name 02/16/18 1447             Exercise Goals   Increase Physical Activity  Yes       Intervention  Provide advice, education, support and counseling about physical activity/exercise  needs.;Develop an individualized exercise prescription for aerobic and resistive training based on initial evaluation findings, risk stratification, comorbidities and participant's personal goals.       Expected Outcomes  Short Term: Attend rehab on a regular basis to increase amount of physical activity.;Long Term: Add in home exercise to make exercise part of routine and to increase amount of physical activity.;Long Term: Exercising regularly at least 3-5 days a week.       Increase Strength and Stamina  Yes       Intervention  Provide advice, education, support and counseling about physical activity/exercise needs.;Develop an individualized exercise prescription for aerobic and resistive training based on initial evaluation findings, risk stratification, comorbidities and participant's personal goals.       Expected Outcomes  Short Term: Increase workloads from initial exercise prescription for resistance, speed, and METs.;Long Term: Improve cardiorespiratory fitness, muscular endurance and strength as measured by increased METs and functional capacity (6MWT);Short Term: Perform resistance training exercises routinely during rehab and add in resistance training at home       Able to understand and use rate of perceived exertion (RPE) scale  Yes       Intervention  Provide education and explanation on how  to use RPE scale       Expected Outcomes  Short Term: Able to use RPE daily in rehab to express subjective intensity level;Long Term:  Able to use RPE to guide intensity level when exercising independently       Able to understand and use Dyspnea scale  Yes       Intervention  Provide education and explanation on how to use Dyspnea scale       Expected Outcomes  Short Term: Able to use Dyspnea scale daily in rehab to express subjective sense of shortness of breath during exertion;Long Term: Able to use Dyspnea scale to guide intensity level when exercising independently       Knowledge and  understanding of Target Heart Rate Range (THRR)  Yes       Intervention  Provide education and explanation of THRR including how the numbers were predicted and where they are located for reference       Expected Outcomes  Short Term: Able to state/look up THRR;Short Term: Able to use daily as guideline for intensity in rehab;Long Term: Able to use THRR to govern intensity when exercising independently       Able to check pulse independently  Yes       Intervention  Provide education and demonstration on how to check pulse in carotid and radial arteries.;Review the importance of being able to check your own pulse for safety during independent exercise       Expected Outcomes  Short Term: Able to explain why pulse checking is important during independent exercise;Long Term: Able to check pulse independently and accurately       Understanding of Exercise Prescription  Yes       Intervention  Provide education, explanation, and written materials on patient's individual exercise prescription       Expected Outcomes  Short Term: Able to explain program exercise prescription;Long Term: Able to explain home exercise prescription to exercise independently          Exercise Goals Re-Evaluation :   Discharge Exercise Prescription (Final Exercise Prescription Changes): Exercise Prescription Changes - 02/16/18 1400      Response to Exercise   Blood Pressure (Admit)  128/72    Blood Pressure (Exercise)  132/72    Blood Pressure (Exit)  128/70    Heart Rate (Admit)  64 bpm    Heart Rate (Exercise)  88 bpm    Heart Rate (Exit)  67 bpm    Oxygen Saturation (Admit)  96 %    Oxygen Saturation (Exercise)  93 %    Oxygen Saturation (Exit)  96 %    Rating of Perceived Exertion (Exercise)  11    Perceived Dyspnea (Exercise)  0    Symptoms  leg fatigue/pain bilateral back of both legs    Comments  walk test results       Nutrition:  Target Goals: Understanding of nutrition guidelines, daily intake of  sodium <1514m, cholesterol <2065m calories 30% from fat and 7% or less from saturated fats, daily to have 5 or more servings of fruits and vegetables.  Biometrics: Pre Biometrics - 02/16/18 1447      Pre Biometrics   Height  5' 1.5" (1.562 m)    Weight  132 lb 3.2 oz (60 kg)    Waist Circumference  30 inches    Hip Circumference  39 inches    Waist to Hip Ratio  0.77 %    BMI (Calculated)  24.58[place]  Single Leg Stand  0.54 seconds        Nutrition Therapy Plan and Nutrition Goals: Nutrition Therapy & Goals - 02/16/18 1426      Personal Nutrition Goals   Nutrition Goal  Learn to eat a heart healthy diet.    Comments  She is scared to eat anything with sodium in it. She does not eat anything fried and she does not eat sweets.      Intervention Plan   Intervention  Prescribe, educate and counsel regarding individualized specific dietary modifications aiming towards targeted core components such as weight, hypertension, lipid management, diabetes, heart failure and other comorbidities.    Expected Outcomes  Short Term Goal: Understand basic principles of dietary content, such as calories, fat, sodium, cholesterol and nutrients.;Long Term Goal: Adherence to prescribed nutrition plan.       Nutrition Assessments: Nutrition Assessments - 02/16/18 1402      MEDFICTS Scores   Pre Score  0       Nutrition Goals Re-Evaluation:   Nutrition Goals Discharge (Final Nutrition Goals Re-Evaluation):   Psychosocial: Target Goals: Acknowledge presence or absence of significant depression and/or stress, maximize coping skills, provide positive support system. Participant is able to verbalize types and ability to use techniques and skills needed for reducing stress and depression.   Initial Review & Psychosocial Screening: Initial Psych Review & Screening - 02/16/18 1425      Initial Review   Current issues with  None Identified      Family Dynamics   Good Support System?  Yes     Comments  Her daughter in law and her neighbor      Barriers   Psychosocial barriers to participate in program  There are no identifiable barriers or psychosocial needs.;The patient should benefit from training in stress management and relaxation.      Screening Interventions   Interventions  Encouraged to exercise    Expected Outcomes  Short Term goal: Utilizing psychosocial counselor, staff and physician to assist with identification of specific Stressors or current issues interfering with healing process. Setting desired goal for each stressor or current issue identified.;Long Term Goal: Stressors or current issues are controlled or eliminated.;Short Term goal: Identification and review with participant of any Quality of Life or Depression concerns found by scoring the questionnaire.;Long Term goal: The participant improves quality of Life and PHQ9 Scores as seen by post scores and/or verbalization of changes       Quality of Life Scores:  Scores of 19 and below usually indicate a poorer quality of life in these areas.  A difference of  2-3 points is a clinically meaningful difference.  A difference of 2-3 points in the total score of the Quality of Life Index has been associated with significant improvement in overall quality of life, self-image, physical symptoms, and general health in studies assessing change in quality of life.  PHQ-9: Recent Review Flowsheet Data    Depression screen [facility] 2/9 02/16/2018 11/25/2017 04/21/2017 07/30/2016 04/18/2016   Decreased Interest 3 1 0 0 0   Down, Depressed, Hopeless 0 0 0 0 0   PHQ - 2 Score 3 1 0 0 0   Altered sleeping 0 2 - 0 -   Tired, decreased energy 3 3 - 0 -   Change in appetite 2 0 - 0 -   Feeling bad or failure about yourself  0 0 - 0 -   Trouble concentrating 0 0 - 0 -   Moving slowly  or fidgety/restless 0 0 - 0 -   Suicidal thoughts 0 0 - 0 -   PHQ-9 Score 8 6 - 0 -   Difficult doing work/chores Somewhat difficult Somewhat difficult - -  -     Interpretation of Total Score  Total Score Depression Severity:  1-4 = Minimal depression, 5-9 = Mild depression, 10-14 = Moderate depression, 15-19 = Moderately severe depression, 20-27 = Severe depression   Psychosocial Evaluation and Intervention:   Psychosocial Re-Evaluation:   Psychosocial Discharge (Final Psychosocial Re-Evaluation):   Education: Education Goals: Education classes will be provided on a weekly basis, covering required topics. Participant will state understanding/return demonstration of topics presented.  Learning Barriers/Preferences: Learning Barriers/Preferences - 02/16/18 1403      Learning Barriers/Preferences   Learning Barriers  None    Learning Preferences  Video       Education Topics:  Initial Evaluation Education: - Verbal, written and demonstration of respiratory meds, oximetry and breathing techniques. Instruction on use of nebulizers and MDIs and importance of monitoring MDI activations.   Pulmonary Rehab from 02/16/2018 in [facility] Cardiac and Pulmonary Rehab  Date  02/16/18  Educator  [facility]  Instruction Review Code  1- Verbalizes Understanding      General Nutrition Guidelines/Fats and Fiber: -Group instruction provided by verbal, written material, models and posters to present the general guidelines for heart healthy nutrition. Gives an explanation and review of dietary fats and fiber.   Cardiac Rehab from 09/07/2014 in [facility] Cardiac and Pulmonary Rehab  Date  08/29/14  Educator  C. Joneen Caraway, [place]  Instruction Review Code (retired)  2- meets goals/outcomes      Controlling Sodium/Reading Food Labels: -Group verbal and written material supporting the discussion of sodium use in heart healthy nutrition. Review and explanation with models, verbal and written materials for utilization of the food label.   Cardiac Rehab from 09/07/2014 in [facility] Cardiac and Pulmonary Rehab  Date  09/05/14  Educator  CR  Instruction Review Code (retired)  2-  meets goals/outcomes      Exercise Physiology & General Exercise Guidelines: - Group verbal and written instruction with models to review the exercise physiology of the cardiovascular system and associated critical values. Provides general exercise guidelines with specific guidelines to those with heart or lung disease.    Cardiac Rehab from 09/07/2014 in [facility] Cardiac and Pulmonary Rehab  Date  07/27/14  Educator  Nikoli Nasser Art Macmillan MS CEP  Instruction Review Code (retired)  2- meets goals/outcomes      Aerobic Exercise & Resistance Training: - Gives group verbal and written instruction on the various components of exercise. Focuses on aerobic and resistive training programs and the benefits of this training and how to safely progress through these programs.   Cardiac Rehab from 09/07/2014 in [facility] Cardiac and Pulmonary Rehab  Date  08/01/14  Educator  RM  Instruction Review Code (retired)  2- Statistician, Balance, Mind/Body Relaxation: Provides group verbal/written instruction on the benefits of flexibility and balance training, including mind/body exercise modes such as yoga, pilates and tai chi.  Demonstration and skill practice provided.   Cardiac Rehab from 09/07/2014 in [facility] Cardiac and Pulmonary Rehab  Date  08/03/14  Educator  RM  Instruction Review Code (retired)  2- meets goals/outcomes      Stress and Anxiety: - Provides group verbal and written instruction about the health risks of elevated stress and causes of high stress.  Discuss the correlation between heart/lung disease  and anxiety and treatment options. Review healthy ways to manage with stress and anxiety.   Cardiac Rehab from 09/07/2014 in [facility] Cardiac and Pulmonary Rehab  Date  07/20/14  Educator  Lucianne Lei, LCSW  Instruction Review Code (retired)  2- meets goals/outcomes      Depression: - Provides group verbal and written instruction on the correlation between heart/lung disease  and depressed mood, treatment options, and the stigmas associated with seeking treatment.   Cardiac Rehab from 09/07/2014 in [facility] Cardiac and Pulmonary Rehab  Date  09/07/14  Educator  [facility]  Instruction Review Code (retired)  2- meets goals/outcomes      Exercise & Equipment Safety: - Individual verbal instruction and demonstration of equipment use and safety with use of the equipment.   Pulmonary Rehab from 02/16/2018 in [facility] Cardiac and Pulmonary Rehab  Date  02/16/18  Educator  [facility]  Instruction Review Code  1- Verbalizes Understanding      Infection Prevention: - Provides verbal and written material to individual with discussion of infection control including proper hand washing and proper equipment cleaning during exercise session.   Pulmonary Rehab from 02/16/2018 in [facility] Cardiac and Pulmonary Rehab  Date  02/16/18  Educator  [facility]  Instruction Review Code  1- Verbalizes Understanding      Falls Prevention: - Provides verbal and written material to individual with discussion of falls prevention and safety.   Pulmonary Rehab from 02/16/2018 in [facility] Cardiac and Pulmonary Rehab  Date  02/16/18  Educator  [facility]  Instruction Review Code  1- Verbalizes Understanding      Diabetes: - Individual verbal and written instruction to review signs/symptoms of diabetes, desired ranges of glucose level fasting, after meals and with exercise. Advice that pre and post exercise glucose checks will be done for 3 sessions at entry of program.   Chronic Lung Diseases: - Group verbal and written instruction to review updates, respiratory medications, advancements in procedures and treatments. Discuss use of supplemental oxygen including available portable oxygen systems, continuous and intermittent flow rates, concentrators, personal use and safety guidelines. Review proper use of inhaler and spacers. Provide informative websites for self-education.    Energy Conservation: - Provide group verbal and  written instruction for methods to conserve energy, plan and organize activities. Instruct on pacing techniques, use of adaptive equipment and posture/positioning to relieve shortness of breath.   Triggers and Exacerbations: - Group verbal and written instruction to review types of environmental triggers and ways to prevent exacerbations. Discuss weather changes, air quality and the benefits of nasal washing. Review warning signs and symptoms to help prevent infections. Discuss techniques for effective airway clearance, coughing, and vibrations.   AED/CPR: - Group verbal and written instruction with the use of models to demonstrate the basic use of the AED with the basic ABC's of resuscitation.   Anatomy and Physiology of the Lungs: - Group verbal and written instruction with the use of models to provide basic lung anatomy and physiology related to function, structure and complications of lung disease.   Anatomy & Physiology of the Heart: - Group verbal and written instruction and models provide basic cardiac anatomy and physiology, with the coronary electrical and arterial systems. Review of Valvular disease and Heart Failure   Cardiac Rehab from 09/07/2014 in [facility]  Date  08/15/14  Educator  SB  Instruction Review Code (retired)  2- meets goals/outcomes      Cardiac Medications: - Group verbal and written instruction to review commonly  prescribed medications for heart disease. Reviews the medication, class of the drug, and side effects.   Cardiac Rehab from 09/07/2014 in [facility]  Date  07/25/14  Educator  SB  Instruction Review Code (retired)  2- meets goals/outcomes      Know Your Numbers and Risk Factors: -Group verbal and written instruction about important numbers in your health.  Discussion of what are risk factors and how they play a role in the disease process.  Review of Cholesterol, Blood Pressure, Diabetes, and BMI and  the role they play in your overall health.   Sleep Hygiene: -Provides group verbal and written instruction about how sleep can affect your health.  Define sleep hygiene, discuss sleep cycles and impact of sleep habits. Review good sleep hygiene tips.    Other: -Provides group and verbal instruction on various topics (see comments)    Knowledge Questionnaire Score: Knowledge Questionnaire Score - 02/16/18 1401      Knowledge Questionnaire Score   Pre Score  13/18   Reviewed with patient       Core Components/Risk Factors/Patient Goals at Admission: Personal Goals and Risk Factors at Admission - 02/16/18 1427      Core Components/Risk Factors/Patient Goals on Admission    Weight Management  Yes;Weight Loss    Intervention  Weight Management: Develop a combined nutrition and exercise program designed to reach desired caloric intake, while maintaining appropriate intake of nutrient and fiber, sodium and fats, and appropriate energy expenditure required for the weight goal.;Weight Management: Provide education and appropriate resources to help participant work on and attain dietary goals.    Admit Weight  132 lb (59.9 kg)    Goal Weight: Short Term  127 lb (57.6 kg)    Goal Weight: Long Term  120 lb (54.4 kg)    Expected Outcomes  Short Term: Continue to assess and modify interventions until short term weight is achieved;Long Term: Adherence to nutrition and physical activity/exercise program aimed toward attainment of established weight goal;Weight Loss: Understanding of general recommendations for a balanced deficit meal plan, which promotes 1-2 lb weight loss per week and includes a negative energy balance of [phone] kcal/d;Understanding recommendations for meals to include 15-35% energy as protein, 25-35% energy from fat, 35-60% energy from carbohydrates, less than 234m of dietary cholesterol, 20-35 gm of total fiber daily;Understanding of distribution of calorie intake throughout the  day with the consumption of 4-5 meals/snacks    Improve shortness of breath with ADL's  Yes    Intervention  Provide education, individualized exercise plan and daily activity instruction to help decrease symptoms of SOB with activities of daily living.    Expected Outcomes  Short Term: Improve cardiorespiratory fitness to achieve a reduction of symptoms when performing ADLs;Long Term: Be able to perform more ADLs without symptoms or delay the onset of symptoms    Hypertension  Yes    Intervention  Provide education on lifestyle modifcations including regular physical activity/exercise, weight management, moderate sodium restriction and increased consumption of fresh fruit, vegetables, and low fat dairy, alcohol moderation, and smoking cessation.;Monitor prescription use compliance.    Expected Outcomes  Short Term: Continued assessment and intervention until BP is < 140/929mHG in hypertensive participants. < 130/8030mG in hypertensive participants with diabetes, heart failure or chronic kidney disease.;Long Term: Maintenance of blood pressure at goal levels.       Core Components/Risk Factors/Patient Goals Review:    Core Components/Risk Factors/Patient Goals at Discharge (Final Review):  ITP Comments: ITP Comments    Row Name 11/25/17 1147 02/16/18 1359         ITP Comments  Medical Evaluation completed. Chart sent for review and changes to Dr. Emily Filbert Director of [place]. Diagnosis can be found in [facility] encounter 08/21/17  Medical Evaluation completed. Chart sent for review and changes to Dr. Emily Filbert Director of [place]. Diagnosis can be found in [facility] encounter 08/21/17         Comments: Initial ITP

## 2018-02-17 ENCOUNTER — Other Ambulatory Visit: Payer: Self-pay

## 2018-02-17 ENCOUNTER — Encounter: Payer: Self-pay | Admitting: Oncology

## 2018-02-17 ENCOUNTER — Inpatient Hospital Stay: Payer: Medicare Other

## 2018-02-17 ENCOUNTER — Inpatient Hospital Stay: Payer: Medicare Other | Attending: Oncology | Admitting: Oncology

## 2018-02-17 VITALS — BP 113/62 | HR 63 | Ht 62.0 in | Wt 134.0 lb

## 2018-02-17 DIAGNOSIS — Z79899 Other long term (current) drug therapy: Secondary | ICD-10-CM | POA: Diagnosis not present

## 2018-02-17 DIAGNOSIS — I4821 Permanent atrial fibrillation: Secondary | ICD-10-CM | POA: Insufficient documentation

## 2018-02-17 DIAGNOSIS — R040 Epistaxis: Secondary | ICD-10-CM | POA: Diagnosis not present

## 2018-02-17 DIAGNOSIS — D509 Iron deficiency anemia, unspecified: Secondary | ICD-10-CM | POA: Insufficient documentation

## 2018-02-17 DIAGNOSIS — R5383 Other fatigue: Secondary | ICD-10-CM | POA: Diagnosis not present

## 2018-02-17 DIAGNOSIS — E039 Hypothyroidism, unspecified: Secondary | ICD-10-CM | POA: Diagnosis not present

## 2018-02-17 DIAGNOSIS — Z7901 Long term (current) use of anticoagulants: Secondary | ICD-10-CM

## 2018-02-17 DIAGNOSIS — D649 Anemia, unspecified: Secondary | ICD-10-CM

## 2018-02-17 LAB — CBC WITH DIFFERENTIAL/PLATELET
Abs Immature Granulocytes: 0.02 10*3/uL (ref 0.00–0.07)
Basophils Absolute: 0 10*3/uL (ref 0.0–0.1)
Basophils Relative: 1 %
Eosinophils Absolute: 0.3 10*3/uL (ref 0.0–0.5)
Eosinophils Relative: 5 %
HCT: 32.9 % — ABNORMAL LOW (ref 36.0–46.0)
Hemoglobin: 9.8 g/dL — ABNORMAL LOW (ref 12.0–15.0)
Immature Granulocytes: 0 %
Lymphocytes Relative: 20 %
Lymphs Abs: 1.2 10*3/uL (ref 0.7–4.0)
MCH: 28.5 pg (ref 26.0–34.0)
MCHC: 29.8 g/dL — AB (ref 30.0–36.0)
MCV: 95.6 fL (ref 80.0–100.0)
Monocytes Absolute: 0.7 10*3/uL (ref 0.1–1.0)
Monocytes Relative: 13 %
Neutro Abs: 3.5 10*3/uL (ref 1.7–7.7)
Neutrophils Relative %: 61 %
Platelets: 268 10*3/uL (ref 150–400)
RBC: 3.44 MIL/uL — ABNORMAL LOW (ref 3.87–5.11)
RDW: 17.8 % — ABNORMAL HIGH (ref 11.5–15.5)
WBC: 5.8 10*3/uL (ref 4.0–10.5)
nRBC: 0 % (ref 0.0–0.2)

## 2018-02-17 LAB — FERRITIN: Ferritin: 15 ng/mL (ref 11–307)

## 2018-02-17 LAB — IRON AND TIBC
Iron: 61 ug/dL (ref 28–170)
Saturation Ratios: 12 % (ref 10.4–31.8)
TIBC: 506 ug/dL — ABNORMAL HIGH (ref 250–450)
UIBC: 445 ug/dL

## 2018-02-17 LAB — TECHNOLOGIST SMEAR REVIEW: Plt Morphology: ADEQUATE

## 2018-02-17 LAB — RETIC PANEL
IMMATURE RETIC FRACT: 12.5 % (ref 2.3–15.9)
RBC.: 3.44 MIL/uL — ABNORMAL LOW (ref 3.87–5.11)
Retic Count, Absolute: 79.8 10*3/uL (ref 19.0–186.0)
Retic Ct Pct: 2.3 % (ref 0.4–3.1)
Reticulocyte Hemoglobin: 30.5 pg (ref >27.9)

## 2018-02-17 NOTE — Progress Notes (Signed)
Hematology/Oncology Consult note [facility] Telephone:([phone] [phone]   Patient Care Team: Einar Pheasant, MD as PCP - General (Unknown Physician Specialty) [name], MD as PCP - Cardiology (Cardiology) Bary Castilla, Forest Gleason, MD (General Surgery)  REFERRING PROVIDER: Einar Pheasant, MD CHIEF COMPLAINTS/REASON FOR VISIT:  Evaluation of anemia  HISTORY OF PRESENTING ILLNESS:  Ruth Roth is a  84 y.o.  female with PMH listed below who was referred to me for evaluation of anemia Reviewed patient's recent labs, Labs revealed anemia with hemoglobin of 10.2 on 01/27/2018. MCV 88.9,   Reviewed patient's previous labs ordered by primary care physician's office, anemia is chronic onset , duration is since 2016, gradually declines. No aggravating or improving factors. She takes Xarelto for A Fib. Has recurrent epistaxis. History of IDA, cannot tolerate oral iron supplements.   Associated signs and symptoms: Patient reports fatigue.  deniesSOB with exertion.  Denies weight loss, easy bruising, hematochezia, hemoptysis, hematuria. Context: History of GI bleeding: deneis               History of Chronic kidney disease: CKD               Last colonoscopy: 2007    Review of Systems  Constitutional: Positive for fatigue. Negative for appetite change, chills and fever.  HENT:   Negative for hearing loss and voice change.        Nose bleeding.   Eyes: Negative for eye problems.  Respiratory: Negative for chest tightness and cough.   Cardiovascular: Negative for chest pain.  Gastrointestinal: Negative for abdominal distention, abdominal pain and blood in stool.  Endocrine: Negative for hot flashes.  Genitourinary: Negative for difficulty urinating and frequency.   Musculoskeletal: Negative for arthralgias.  Skin: Negative for itching and rash.  Neurological: Negative for extremity weakness.  Hematological: Negative for adenopathy.    Psychiatric/Behavioral: Negative for confusion.    MEDICAL HISTORY:  Past Medical History:  Diagnosis Date  . (HFpEF) heart failure with preserved ejection fraction ([place])    a. 07/2017 Echo: EF 50-55%, no rwma, mild AS/AI, mod MR, mod dil LA, mod TR, PASP 28mHg.  . Autoimmune hepatitis ([place]    followed by Dr SGustavo Lah . Fibrocystic breast disease   . Glaucoma   . Hypercholesterolemia   . Hypertension   . Hypothyroidism   . Inflammatory arthritis   . [place]   . Neuropathy   . Non-obstructive Coronary artery disease    a. 05/2014 NSTEMI/Cath: LM nl, LAD mild dzs, LCX 60ost hazy (? culprit), RCA mild dzs. EF 60% by echo-->Med Rx; b. 08/2017 Cath: LM 30ost, LAD min irregs, LCX small, 40ost/p, RCA large, min irregs, RPDA/RPL1 nl, EF 50-55%. 4+MR.  . Osteoarthritis   . Permanent atrial fibrillation    a. CHA2DS2VASc = 6-->Xarelto.  . 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 Severe mitral regurgitation    a. 07/2017 Echo: Mod MR; b. 08/2017 Cath: 4+/Severe MR.    SURGICAL HISTORY: Past Surgical History:  Procedure Laterality Date  . ABDOMINAL HYSTERECTOMY  1963   partial, secondary to fibroids  . APPENDECTOMY    . Billroth    . CARDIAC CATHETERIZATION  05/12/14   [facility]  . CARDIOVERSION N/A 12/26/2016   Procedure: CARDIOVERSION;  Surgeon: AWellington Hampshire MD;  Location: [facility] ORS;  Service: Cardiovascular;  Laterality: N/A;  . CARDIOVERSION N/A 05/16/2017   Procedure: CARDIOVERSION;  Surgeon: AFletcher Anon  Mertie Clause, MD;  Location: [facility] ORS;  Service: Cardiovascular;  Laterality: N/A;  . CARDIOVERSION N/A 06/09/2017   Procedure: CARDIOVERSION;  Surgeon: [name], MD;  Location: [facility] ORS;  Service: Cardiovascular;  Laterality: N/A;  . CHOLECYSTECTOMY  1998  . COLONOSCOPY  2009  . RIGHT/LEFT HEART CATH AND CORONARY ANGIOGRAPHY Bilateral 08/21/2017   Procedure: RIGHT/LEFT HEART CATH AND CORONARY  ANGIOGRAPHY;  Surgeon: [name], MD;  Location: [facility];  Service: Cardiovascular;  Laterality: Bilateral;  . TEE WITHOUT CARDIOVERSION N/A 08/25/2017   Procedure: TRANSESOPHAGEAL ECHOCARDIOGRAM (TEE);  Surgeon: Jolaine Artist, MD;  Location: [facility] ENDOSCOPY;  Service: Cardiovascular;  Laterality: N/A;  . UPPER GI ENDOSCOPY  2004    SOCIAL HISTORY: Social History   Socioeconomic History  . Marital status: Widowed    Spouse name: Not on file  . Number of children: Not on file  . Years of education: Not on file  . Highest education level: Not on file  Occupational History  . Not on file  Social Needs  . Financial resource strain: Not on file  . Food insecurity:    Worry: Not on file    Inability: Not on file  . Transportation needs:    Medical: Not on file    Non-medical: Not on file  Tobacco Use  . Smoking status: Never Smoker  . Smokeless tobacco: Never Used  Substance and Sexual Activity  . Alcohol use: No    Alcohol/week: 0.0 standard drinks  . Drug use: No  . Sexual activity: Never  Lifestyle  . Physical activity:    Days per week: Not on file    Minutes per session: Not on file  . Stress: Not on file  Relationships  . Social connections:    Talks on phone: Not on file    Gets together: Not on file    Attends religious service: Not on file    Active member of club or organization: Not on file    Attends meetings of clubs or organizations: Not on file    Relationship status: Not on file  . Intimate partner violence:    Fear of current or ex partner: Not on file    Emotionally abused: Not on file    Physically abused: Not on file    Forced sexual activity: Not on file  Other Topics Concern  . Not on file  Social History Narrative  . Not on file    FAMILY HISTORY: Family History  Problem Relation Age of Onset  . Stroke Mother   . Esophageal cancer Brother        also had lung cancer  . Ovarian cancer Daughter   . Colon cancer  Daughter   . Breast cancer Neg Hx     ALLERGIES:  is allergic to statins; haldol [haloperidol lactate]; librax [chlordiazepoxide-clidinium]; plavix [clopidogrel bisulfate]; and ramipril.  MEDICATIONS:  Current Outpatient Medications  Medication Sig Dispense Refill  . brimonidine-timolol (COMBIGAN) 0.2-0.5 % ophthalmic solution Place 1 drop into the left eye 2 (two) times daily.     . dorzolamide (TRUSOPT) 2 % ophthalmic solution Place 1 drop into the left eye 2 (two) times daily.    . fluticasone (FLONASE) 50 MCG/ACT nasal spray Place 2 sprays into both nostrils daily as needed for allergies.     Marland Kitchen gabapentin (NEURONTIN) 300 MG capsule TAKE 2 CAPSULES BY MOUTH FOUR TIMES DAILY 720 capsule 0  . ipratropium (ATROVENT) 0.06 % nasal spray Place 2 sprays into  both nostrils daily as needed for rhinitis.     Marland Kitchen latanoprost (XALATAN) 0.005 % ophthalmic solution Place 1 drop into both eyes at bedtime.     Marland Kitchen levothyroxine (SYNTHROID, LEVOTHROID) 125 MCG tablet Take 1 tablet (125 mcg total) by mouth daily before breakfast. 90 tablet 0  . metoprolol tartrate (LOPRESSOR) 50 MG tablet TAKE 1 TABLET(50 MG) BY MOUTH TWICE DAILY 180 tablet 3  . pantoprazole (PROTONIX) 40 MG tablet Take 1 tablet (40 mg total) by mouth every evening. 90 tablet 3  . ranitidine (ZANTAC) 150 MG tablet TAKE 1 TABLET(150 MG) BY MOUTH DAILY 30 MINUTES BEFORE BREAKFAST 30 tablet 0  . Rivaroxaban (XARELTO) 15 MG TABS tablet Take 1 tablet (15 mg total) by mouth daily with supper. 30 tablet 6  . torsemide (DEMADEX) 20 MG tablet Take 1 tablet (20 mg total) by mouth daily. 30 tablet 3  . traMADol (ULTRAM) 50 MG tablet Take 1 tablet (50 mg total) by mouth daily as needed (for pain.). 30 tablet 0  . ursodiol (ACTIGALL) 300 MG capsule Take 300-600 mg by mouth See admin instructions. Take 2 capsules (600 mg) daily after lunch & 1 capsule (300 mg) at night.     No current facility-administered medications for this visit.      PHYSICAL  EXAMINATION: ECOG PERFORMANCE STATUS: 1 - Symptomatic but completely ambulatory Vitals:   02/17/18 1337  BP: 113/62  Pulse: 63   Filed Weights   02/17/18 1337  Weight: 134 lb (60.8 kg)    Physical Exam  Constitutional: She is oriented to person, place, and time. No distress.  HENT:  Head: Normocephalic and atraumatic.  Mouth/Throat: Oropharynx is clear and moist.  Eyes: Pupils are equal, round, and reactive to light. EOM are normal. No scleral icterus.  Neck: Normal range of motion. Neck supple.  Cardiovascular: Normal heart sounds.  Irregular rhythm.   Pulmonary/Chest: Effort normal. No respiratory distress. She has no wheezes.  Abdominal: Soft. Bowel sounds are normal. She exhibits no distension and no mass. There is no tenderness.  Musculoskeletal: Normal range of motion. She exhibits no edema or deformity.  Neurological: She is alert and oriented to person, place, and time. No cranial nerve deficit. Coordination normal.  Skin: Skin is warm and dry. No rash noted. No erythema.  Psychiatric: She has a normal mood and affect. Her behavior is normal. Thought content normal.     LABORATORY DATA:  I have reviewed the data as listed Lab Results  Component Value Date   WBC 5.8 02/17/2018   HGB 9.8 (L) 02/17/2018   HCT 32.9 (L) 02/17/2018   MCV 95.6 02/17/2018   PLT 268 02/17/2018   Recent Labs    05/27/17 0959  08/21/17 2325  12/25/17 1455 12/29/17 1055 01/27/18 1009 01/30/18 1521 02/11/18 1231  NA 139   < > 140   < > 131* 137 136 139 139  K 4.9   < > 4.3   < > 5.4* 4.7 4.2 4.8 5.0  CL 108   < > 106   < > 99 108 97 106 107  CO2 24   < > 25   < > 19* 21* 28 25 23   GLUCOSE 96   < > 107*   < > 100* 96 95 87 66*  BUN 19   < > 31*   < > 64* 43* 63* 33* 38*  CREATININE 1.13   < > 1.52*   < > 2.24* 1.18* 1.71* 1.05* 1.25*  CALCIUM  9.4   < > 8.5*   < > 8.8* 8.7* 8.4 8.7 8.4*  GFRNONAA  --    < > 30*   < > 19* 40*  --   --  39*  GFRAA  --    < > 34*   < > 22* 47*  --   --   45*  PROT 6.7  --  5.9*  --   --   --  6.2  --   --   ALBUMIN 4.1  --  3.3*  --   --   --  3.6  --   --   AST 16  --  52*  --   --   --  14  --   --   ALT 17  --  98*  --   --   --  14  --   --   ALKPHOS 99  --  111  --   --   --  119*  --   --   BILITOT 0.5  --  0.5  --   --   --  0.3  --   --   BILIDIR 0.1  --   --   --   --   --  0.1  --   --    < > = values in this interval not displayed.   Iron/TIBC/Ferritin/ %Sat    Component Value Date/Time   IRON 61 02/17/2018 1407   TIBC 506 (H) 02/17/2018 1407   FERRITIN 15 02/17/2018 1407   IRONPCTSAT 12 02/17/2018 1407        ASSESSMENT & PLAN:  1. Iron deficiency anemia, unspecified iron deficiency anemia type   2. Epistaxis     Anemia: multifactorial with possible causes including chronic blood loss, hyper/hypothyroidism, nutritional deficiency, infection/chronic inflammation, hemolysis, underlying bone marrow disorders. Will check CBC w differential,iron/TIBC, ferritin, reticulocytes,  blood smear, monoclonal gammopathy evaluation.   Labs reviewed. High TIBC 506, lowe normal ferritin 15. Saturation ratio 12.  Consistent with iron deficiency anemia. Source of blood loss can be due to recurrent epistaxis  She has ENT appt.  Recommend IV iron since she can not tolerate oral iron supplements.  Plan IV iron with Feraheme 553m weekly x 2. Allergy reactions/infusion reaction including anaphylactic reaction discussed with patient. Other side effects include but not limited to high blood pressure, skin rash, weight gain, leg swelling, etc. Patient voices understanding and willing to proceed.   Orders Placed This Encounter  Procedures  . CBC with Differential/Platelet    Standing Status:   Future    Number of Occurrences:   1    Standing Expiration Date:   02/18/2019  . Retic Panel    Standing Status:   Future    Number of Occurrences:   1    Standing Expiration Date:   02/18/2019  . Iron and TIBC    Standing Status:   Future     Number of Occurrences:   1    Standing Expiration Date:   02/18/2019  . Ferritin    Standing Status:   Future    Number of Occurrences:   1    Standing Expiration Date:   02/18/2019  . Technologist smear review    Standing Status:   Future    Number of Occurrences:   1    Standing Expiration Date:   02/18/2019  . Multiple Myeloma Panel (SPEP&IFE w/QIG)    Standing Status:   Future  Number of Occurrences:   1    Standing Expiration Date:   02/17/2019  . Kappa/lambda light chains    Standing Status:   Future    Number of Occurrences:   1    Standing Expiration Date:   02/17/2019    All questions were answered. The patient knows to call the clinic with any problems questions or concerns.  Return of visit: 8 weeks.  Thank you for this kind referral and the opportunity to participate in the care of this patient. A copy of today's note is routed to referring provider  Total face to face encounter time for this patient visit was 69mn. >50% of the time was  spent in counseling and coordination of care.    ZEarlie Server MD, PhD Hematology Oncology CSt Bernard Hospitalat AWyoming Behavioral HealthPager- [phone]/12/2017

## 2018-02-18 ENCOUNTER — Encounter (HOSPITAL_COMMUNITY): Payer: Self-pay | Admitting: Internal Medicine

## 2018-02-18 ENCOUNTER — Ambulatory Visit: Payer: Medicare Other

## 2018-02-18 ENCOUNTER — Ambulatory Visit (HOSPITAL_COMMUNITY)
Admission: RE | Admit: 2018-02-18 | Discharge: 2018-02-18 | Disposition: A | Discharge status: Home / Self Care | Payer: Medicare Other | Source: Ambulatory Visit | Attending: Internal Medicine | Admitting: Internal Medicine

## 2018-02-18 VITALS — BP 104/58 | HR 64 | Wt 135.0 lb

## 2018-02-18 DIAGNOSIS — I4819 Other persistent atrial fibrillation: Secondary | ICD-10-CM | POA: Diagnosis not present

## 2018-02-18 DIAGNOSIS — G629 Polyneuropathy, unspecified: Secondary | ICD-10-CM | POA: Insufficient documentation

## 2018-02-18 DIAGNOSIS — N179 Acute kidney failure, unspecified: Secondary | ICD-10-CM | POA: Insufficient documentation

## 2018-02-18 DIAGNOSIS — K754 Autoimmune hepatitis: Secondary | ICD-10-CM | POA: Insufficient documentation

## 2018-02-18 DIAGNOSIS — Z8711 Personal history of peptic ulcer disease: Secondary | ICD-10-CM | POA: Diagnosis not present

## 2018-02-18 DIAGNOSIS — M79669 Pain in unspecified lower leg: Secondary | ICD-10-CM

## 2018-02-18 DIAGNOSIS — I110 Hypertensive heart disease with heart failure: Secondary | ICD-10-CM | POA: Diagnosis not present

## 2018-02-18 DIAGNOSIS — E7800 Pure hypercholesterolemia, unspecified: Secondary | ICD-10-CM | POA: Insufficient documentation

## 2018-02-18 DIAGNOSIS — M79662 Pain in left lower leg: Secondary | ICD-10-CM | POA: Diagnosis not present

## 2018-02-18 DIAGNOSIS — E039 Hypothyroidism, unspecified: Secondary | ICD-10-CM | POA: Diagnosis not present

## 2018-02-18 DIAGNOSIS — I340 Nonrheumatic mitral (valve) insufficiency: Secondary | ICD-10-CM

## 2018-02-18 DIAGNOSIS — Z888 Allergy status to other drugs, medicaments and biological substances status: Secondary | ICD-10-CM | POA: Insufficient documentation

## 2018-02-18 DIAGNOSIS — I252 Old myocardial infarction: Secondary | ICD-10-CM | POA: Diagnosis not present

## 2018-02-18 DIAGNOSIS — D509 Iron deficiency anemia, unspecified: Secondary | ICD-10-CM | POA: Insufficient documentation

## 2018-02-18 DIAGNOSIS — M79661 Pain in right lower leg: Secondary | ICD-10-CM | POA: Diagnosis not present

## 2018-02-18 DIAGNOSIS — Z79899 Other long term (current) drug therapy: Secondary | ICD-10-CM | POA: Diagnosis not present

## 2018-02-18 DIAGNOSIS — I4821 Permanent atrial fibrillation: Secondary | ICD-10-CM | POA: Diagnosis not present

## 2018-02-18 DIAGNOSIS — I2510 Atherosclerotic heart disease of native coronary artery without angina pectoris: Secondary | ICD-10-CM | POA: Insufficient documentation

## 2018-02-18 DIAGNOSIS — Z7901 Long term (current) use of anticoagulants: Secondary | ICD-10-CM | POA: Diagnosis not present

## 2018-02-18 DIAGNOSIS — Z79890 Hormone replacement therapy (postmenopausal): Secondary | ICD-10-CM | POA: Diagnosis not present

## 2018-02-18 DIAGNOSIS — I5032 Chronic diastolic (congestive) heart failure: Secondary | ICD-10-CM

## 2018-02-18 LAB — BASIC METABOLIC PANEL
Anion gap: 12 (ref 5–15)
BUN: 45 mg/dL — ABNORMAL HIGH (ref 8–23)
CO2: 24 mmol/L (ref 22–32)
Calcium: 8.3 mg/dL — ABNORMAL LOW (ref 8.9–10.3)
Chloride: 102 mmol/L (ref 98–111)
Creatinine, Ser: 1.33 mg/dL — ABNORMAL HIGH (ref 0.44–1.00)
GFR calc Af Amer: 42 mL/min — ABNORMAL LOW (ref >60)
GFR calc non Af Amer: 36 mL/min — ABNORMAL LOW (ref >60)
Glucose, Bld: 96 mg/dL (ref 70–99)
POTASSIUM: 4.1 mmol/L (ref 3.5–5.1)
Sodium: 138 mmol/L (ref 135–145)

## 2018-02-18 LAB — KAPPA/LAMBDA LIGHT CHAINS
Kappa free light chain: 36.1 mg/L — ABNORMAL HIGH (ref 3.3–19.4)
Kappa, lambda light chain ratio: 1.83 — ABNORMAL HIGH (ref 0.26–1.65)
Lambda free light chains: 19.7 mg/L (ref 5.7–26.3)

## 2018-02-18 NOTE — Progress Notes (Signed)
Advanced Heart Failure Clinic Note   PCP: Einar Pheasant, MD PCP-Cardiologist: [name], MD   HPI: Ruth Roth is a 84 y.o. female with a history of 84 year old female with a history of coronary artery disease, chronic diastolic congestive heart failure, persistent atrial fibrillation, hypertension, peptic ulcer disease, fibrocystic breast disease, inflammatory arthritis, autoimmune hepatitis, and neuropathy.  Admitted to [facility] 08/21/17 s/p R/LHC which showed significantly elevated filling pressures, severe MR, and low cardiac output, and then transferred to [facility] 6/14 for consideration of MitraClip.  AKI noted on admission felt to be due to cardiorenal syndrome. She required milrinone and lasix for diuresis. Diuresed 25 lbs and transitioned to 20 mg torsemide daily. Case and images reviewed at length with structural heart team. MR felt to be truly functional with improvement from hemodynamic optimization. Not felt to be MitraClip candidate at this time. Discharge weight: 121 lbs.   Today she returns for 1 week follow up. Last week she was volume overloaded so torsemide was increased for a few days.  Overall feeling much better. Denies SOB/PND/Orthopnea. Appetite ok. No fever or chills.Taking all medications Weight at home has gone form 130 to 125 pounds. She will start cardiac rehab 03/09/2018 at [facility]. She did complete initial 6MW but she was only able to walk 3 minutes. She says she had to stop due to leg pain in her calves.   Cardiac Studies: Echo 10/09/17: EF 55-60%, no AI, mild to mod MR, LA moderately dilated, RA mildly dilated, RV normal, PA peak pressure 52 mmHg  [facility] 08/21/2017 - Mild non-obstructive CAD RHC Procedural Findings: (Obtained from Scan 08/21/17 at 1030) Hemodynamics (mmHg) RA mean 17 RV 68/11 (19) PA 62/27 (44) PCWP 41  AO 147/67 (102) Cardiac Output (Fick) 2.45 Cardiac Index  (Fick) 1.46 PVR 1.23 WU  Echo 08/01/17 LVEF 50-55%, Mild AS, Mod MR, Mod LAE, Mild RAE,  Mod TR, PA peak pressure 65 mm Hg.MR ++ calcified, and RV mild/moderately reduced function.  Review of systems complete and found to be negative unless listed in HPI.    Past Medical History:  Diagnosis Date  . (HFpEF) heart failure with preserved ejection fraction ([place])    a. 07/2017 Echo: EF 50-55%, no rwma, mild AS/AI, mod MR, mod dil LA, mod TR, PASP 51mmHg.  . Autoimmune hepatitis ([place])    followed by Dr [name]  . Fibrocystic breast disease   . Glaucoma   . Hypercholesterolemia   . Hypertension   . Hypothyroidism   . Inflammatory arthritis   . MI (myocardial infarction) ([place])   . Neuropathy   . Non-obstructive Coronary artery disease    a. 05/2014 NSTEMI/Cath: LM nl, LAD mild dzs, LCX 60ost hazy (? culprit), RCA mild dzs. EF 60% by echo-->Med Rx; b. 08/2017 Cath: LM 30ost, LAD min irregs, LCX small, 40ost/p, RCA large, min irregs, RPDA/RPL1 nl, EF 50-55%. 4+MR.  . Osteoarthritis   . Permanent atrial fibrillation    a. CHA2DS2VASc = 6-->Xarelto.  . 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 Severe mitral regurgitation    a. 07/2017 Echo: Mod MR; b. 08/2017 Cath: 4+/Severe MR.    Current Outpatient Medications  Medication Sig Dispense Refill  . brimonidine-timolol (COMBIGAN) 0.2-0.5 % ophthalmic solution Place 1 drop into the left eye 2 (two) times daily.     . dorzolamide (TRUSOPT) 2 % ophthalmic solution Place 1 drop into the left eye 2 (two) times daily.    . fluticasone (FLONASE)  50 MCG/ACT nasal spray Place 2 sprays into both nostrils daily as needed for allergies.     Marland Kitchen gabapentin (NEURONTIN) 300 MG capsule TAKE 2 CAPSULES BY MOUTH FOUR TIMES DAILY 720 capsule 0  . ipratropium (ATROVENT) 0.06 % nasal spray Place 2 sprays into both nostrils daily as needed for rhinitis.     Marland Kitchen latanoprost (XALATAN) 0.005 % ophthalmic solution Place 1 drop into both eyes at bedtime.     Marland Kitchen levothyroxine (SYNTHROID, LEVOTHROID) 125 MCG tablet  Take 1 tablet (125 mcg total) by mouth daily before breakfast. 90 tablet 0  . metoprolol tartrate (LOPRESSOR) 50 MG tablet TAKE 1 TABLET(50 MG) BY MOUTH TWICE DAILY 180 tablet 3  . pantoprazole (PROTONIX) 40 MG tablet Take 1 tablet (40 mg total) by mouth every evening. 90 tablet 3  . ranitidine (ZANTAC) 150 MG tablet TAKE 1 TABLET(150 MG) BY MOUTH DAILY 30 MINUTES BEFORE BREAKFAST 30 tablet 0  . Rivaroxaban (XARELTO) 15 MG TABS tablet Take 1 tablet (15 mg total) by mouth daily with supper. 30 tablet 6  . torsemide (DEMADEX) 20 MG tablet Take 1 tablet (20 mg total) by mouth daily. 30 tablet 3  . traMADol (ULTRAM) 50 MG tablet Take 1 tablet (50 mg total) by mouth daily as needed (for pain.). 30 tablet 0  . ursodiol (ACTIGALL) 300 MG capsule Take 300-600 mg by mouth See admin instructions. Take 2 capsules (600 mg) daily after lunch & 1 capsule (300 mg) at night.     No current facility-administered medications for this encounter.     Allergies  Allergen Reactions  . Statins Other (See Comments)    Affect her liver, Caused liver problems   . Haldol [Haloperidol Lactate] Rash  . Librax [Chlordiazepoxide-Clidinium] Rash  . Plavix [Clopidogrel Bisulfate] Rash  . Ramipril Rash   Social History   Socioeconomic History  . Marital status: Widowed    Spouse name: Not on file  . Number of children: Not on file  . Years of education: Not on file  . Highest education level: Not on file  Occupational History  . Not on file  Social Needs  . Financial resource strain: Not on file  . Food insecurity:    Worry: Not on file    Inability: Not on file  . Transportation needs:    Medical: Not on file    Non-medical: Not on file  Tobacco Use  . Smoking status: Never Smoker  . Smokeless tobacco: Never Used  Substance and Sexual Activity  . Alcohol use: No    Alcohol/week: 0.0 standard drinks  . Drug use: No  . Sexual activity: Never  Lifestyle  . Physical activity:    Days per week: Not on  file    Minutes per session: Not on file  . Stress: Not on file  Relationships  . Social connections:    Talks on phone: Not on file    Gets together: Not on file    Attends religious service: Not on file    Active member of club or organization: Not on file    Attends meetings of clubs or organizations: Not on file    Relationship status: Not on file  . Intimate partner violence:    Fear of current or ex partner: Not on file    Emotionally abused: Not on file    Physically abused: Not on file    Forced sexual activity: Not on file  Other Topics Concern  . Not on file  Social  History Narrative  . Not on file   Family History  Problem Relation Age of Onset  . Stroke Mother   . Esophageal cancer Brother        also had lung cancer  . Ovarian cancer Daughter   . Colon cancer Daughter   . Breast cancer Neg Hx    Vitals:   02/18/18 1412  BP: (!) 104/58  Pulse: 64  SpO2: 100%  Weight: 61.2 kg (135 lb)     Wt Readings from Last 3 Encounters:  02/18/18 61.2 kg (135 lb)  02/17/18 60.8 kg (134 lb)  02/16/18 60 kg (132 lb 3.2 oz)   PHYSICAL EXAM: General:  Well appearing. No resp difficulty HEENT: normal Neck: supple. no JVD. Carotids 2+ bilat; no bruits. No lymphadenopathy or thryomegaly appreciated. Cor: PMI nondisplaced. Regular rate & rhythm. No rubs, gallops. 3/6 MR  Lungs: clear Abdomen: soft, nontender, nondistended. No hepatosplenomegaly. No bruits or masses. Good bowel sounds. Extremities: no cyanosis, clubbing, rash, edema Neuro: alert & orientedx3, cranial nerves grossly intact. moves all 4 extremities w/o difficulty. Affect pleasant  ASSESSMENT & PLAN:  1.Chronic diastolic congestive heart failure with low output: - Echo 08/01/17 LVEF 50-55%, Mild AS, Mod MR, Mod LAE, Mild RAE, Mod TR, RV moderately HK PA peak pressure 65 mm Hg. - Cath 08/21/17 with signficantly elevated filling pressures, severe MR, and low cardiac output. Had cardiorenal syndrome on admit  with severe MR and RV failure and required milrinone - Echo 10/09/17: EF 55-60%, mild to mod MR - Recent episode of AKI with overdiuresis so torsemide cut back to 20 qod -NYHA II. Volume status stable. Continue Torsemide 20 daily - Continue Lopressor 50 bid -She has started cardiac rehab.  2. Severe mitral regurgitation: - Echo 08/01/17 LVEF 50-55%, Mild AS, Mod MR, Mod LAE, Mild RAE, Mod TR, PA peak pressure 65 mm Hg.MR ++ calcified, and RV mild/moderately reduced function. - TEE 6/17 showed only mild MR with restricted posterior leaflet.  - Echo 10/09/17: EF 55-60%, mild to mod MR - Likely functional MR and MR improved with hemodynamic optimization.  Will follow closely if fails close medical therapy consider MitraClip 3. Persistent atrial fibrillation: - Has previously failed amiodarone and DCCV x3.  - Regular pulse - Continue Lopressor 50 bid.  - Continue Xarelto. Denies bleeding - Seen by Dr. Rayann Heman 12/01/17. He does not recommend either afib ablation or AV node ablation + PPM.    4. Essential hypertension: - Stable.  5. Recurrent AKI - Cr stable 01/30/18 after recent AKI. Will recheck. Watch closely with diuresis  Check BMET today.  6. Anemia, iron deficient She has been set up for iron infusion. 7. Leg pain Having bilateral calf pain with ambulation. Check ABI.    BMEt next week.  Follow up 6-8 weeks.   Darrick Grinder, NP  2:15 PM  Patient seen and examined with Darrick Grinder, NP. We discussed all aspects of the encounter. I agree with the assessment and plan as stated above.   Volume status much improved with increase of torsemide 20 daily. Likely has narrow euvolemic window. May have to allow some degree of AKI to keep her dry. Check labs today and 1 week. Will also check ABIs for exertional leg pain. Will start CR at end of this month. Can consider MitraClip as needed.   Glori Bickers, MD  3:04 PM

## 2018-02-18 NOTE — Patient Instructions (Addendum)
Labs done today  Labs will need to be done in 1 week at [facility]. You have been given a prescription for this Lab.  Your physician has requested that you have an ankle brachial index (ABI). During this test an ultrasound and blood pressure cuff are used to evaluate the arteries that supply the arms and legs with blood. Allow thirty minutes for this exam. There are no restrictions or special instructions. This will be done at [facility]. They will call you to schedule your appointment.  Follow up with Dr. Haroldine Laws in 3 months.

## 2018-02-19 ENCOUNTER — Other Ambulatory Visit: Payer: Self-pay | Admitting: Internal Medicine

## 2018-02-19 DIAGNOSIS — H401131 Primary open-angle glaucoma, bilateral, mild stage: Secondary | ICD-10-CM | POA: Diagnosis not present

## 2018-02-20 ENCOUNTER — Ambulatory Visit: Payer: Medicare Other

## 2018-02-21 LAB — MULTIPLE MYELOMA PANEL, SERUM
Albumin SerPl Elph-Mcnc: 3.5 g/dL (ref 2.9–4.4)
Albumin/Glob SerPl: 1.4 (ref 0.7–1.7)
Alpha 1: 0.2 g/dL (ref 0.0–0.4)
Alpha2 Glob SerPl Elph-Mcnc: 0.8 g/dL (ref 0.4–1.0)
B-Globulin SerPl Elph-Mcnc: 1 g/dL (ref 0.7–1.3)
Gamma Glob SerPl Elph-Mcnc: 0.6 g/dL (ref 0.4–1.8)
Globulin, Total: 2.6 g/dL (ref 2.2–3.9)
IGA: 50 mg/dL — AB (ref 64–422)
IgG (Immunoglobin G), Serum: 757 mg/dL (ref 700–1600)
IgM (Immunoglobulin M), Srm: 43 mg/dL (ref 26–217)
Total Protein ELP: 6.1 g/dL (ref 6.0–8.5)

## 2018-02-23 ENCOUNTER — Ambulatory Visit: Payer: Medicare Other

## 2018-02-23 DIAGNOSIS — R040 Epistaxis: Secondary | ICD-10-CM | POA: Diagnosis not present

## 2018-02-23 DIAGNOSIS — I5032 Chronic diastolic (congestive) heart failure: Secondary | ICD-10-CM

## 2018-02-23 DIAGNOSIS — J340 Abscess, furuncle and carbuncle of nose: Secondary | ICD-10-CM | POA: Diagnosis not present

## 2018-02-23 NOTE — Progress Notes (Signed)
Pulmonary Individual Treatment Plan  Patient Details  Name: Ruth Roth MRN: [id number] Date of Birth: 09-Feb-1929 Referring Provider:     Pulmonary Rehab from 02/16/2018 in [facility] Cardiac and Pulmonary Rehab  Referring Provider  Glori Bickers MD      Initial Encounter Date:    Pulmonary Rehab from 02/16/2018 in [facility] Cardiac and Pulmonary Rehab  Date  02/16/18      Visit Diagnosis: Chronic diastolic heart failure ([place])  Patient's Home Medications on Admission:  Current Outpatient Medications:  .  brimonidine-timolol (COMBIGAN) 0.2-0.5 % ophthalmic solution, Place 1 drop into the left eye 2 (two) times daily. , Disp: , Rfl:  .  dorzolamide (TRUSOPT) 2 % ophthalmic solution, Place 1 drop into the left eye 2 (two) times daily., Disp: , Rfl:  .  fluticasone (FLONASE) 50 MCG/ACT nasal spray, Place 2 sprays into both nostrils daily as needed for allergies. , Disp: , Rfl:  .  gabapentin (NEURONTIN) 300 MG capsule, TAKE 2 CAPSULES BY MOUTH FOUR TIMES DAILY, Disp: 720 capsule, Rfl: 0 .  ipratropium (ATROVENT) 0.06 % nasal spray, Place 2 sprays into both nostrils daily as needed for rhinitis. , Disp: , Rfl:  .  latanoprost (XALATAN) 0.005 % ophthalmic solution, Place 1 drop into both eyes at bedtime. , Disp: , Rfl:  .  levothyroxine (SYNTHROID, LEVOTHROID) 125 MCG tablet, Take 1 tablet (125 mcg total) by mouth daily before breakfast., Disp: 90 tablet, Rfl: 0 .  metoprolol tartrate (LOPRESSOR) 50 MG tablet, TAKE 1 TABLET(50 MG) BY MOUTH TWICE DAILY, Disp: 180 tablet, Rfl: 3 .  pantoprazole (PROTONIX) 40 MG tablet, Take 1 tablet (40 mg total) by mouth every evening., Disp: 90 tablet, Rfl: 3 .  ranitidine (ZANTAC) 150 MG tablet, TAKE 1 TABLET(150 MG) BY MOUTH DAILY 30 MINUTES BEFORE BREAKFAST, Disp: 30 tablet, Rfl: 0 .  Rivaroxaban (XARELTO) 15 MG TABS tablet, Take 1 tablet (15 mg total) by mouth daily with supper., Disp: 30 tablet, Rfl: 6 .  torsemide (DEMADEX) 20 MG tablet, Take 1 tablet (20 mg  total) by mouth daily., Disp: 30 tablet, Rfl: 3 .  traMADol (ULTRAM) 50 MG tablet, Take 1 tablet (50 mg total) by mouth daily as needed (for pain.)., Disp: 30 tablet, Rfl: 0 .  ursodiol (ACTIGALL) 300 MG capsule, Take 300-600 mg by mouth See admin instructions. Take 2 capsules (600 mg) daily after lunch & 1 capsule (300 mg) at night., Disp: , Rfl:   Past Medical History: Past Medical History:  Diagnosis Date  . (HFpEF) heart failure with preserved ejection fraction ([place])    a. 07/2017 Echo: EF 50-55%, no rwma, mild AS/AI, mod MR, mod dil LA, mod TR, PASP 40mHg.  . Autoimmune hepatitis ([place]    followed by Dr SGustavo Lah . Fibrocystic breast disease   . Glaucoma   . Hypercholesterolemia   . Hypertension   . Hypothyroidism   . Inflammatory arthritis   . [place]   . Neuropathy   . Non-obstructive Coronary artery disease    a. 05/2014 NSTEMI/Cath: LM nl, LAD mild dzs, LCX 60ost hazy (? culprit), RCA mild dzs. EF 60% by echo-->Med Rx; b. 08/2017 Cath: LM 30ost, LAD min irregs, LCX small, 40ost/p, RCA large, min irregs, RPDA/RPL1 nl, EF 50-55%. 4+MR.  . Osteoarthritis   . Permanent atrial fibrillation    a. CHA2DS2VASc = 6-->Xarelto.  . 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 Severe mitral regurgitation  a. 07/2017 Echo: Mod MR; b. 08/2017 Cath: 4+/Severe MR.    Tobacco Use: Social History   Tobacco Use  Smoking Status Never Smoker  Smokeless Tobacco Never Used    Labs: Recent Review Flowsheet Data    Labs for ITP Cardiac and Pulmonary Rehab Latest Ref Rng & Units 03/20/2016 07/29/2016 01/17/2017 05/27/2017 01/27/2018   Cholestrol 0 - 200 mg/dL 230(H) 215(H) 199 159 144   LDLCALC 0 - 99 mg/dL 140(H) 125(H) 110(H) 74 78   LDLDIRECT mg/dL - - - - -   HDL >39.00 mg/dL 67.30 54.20 67.30 64.30 44.10   Trlycerides 0.0 - 149.0 mg/dL 113.0 178.0(H) 107.0 101.0 107.0   Hemoglobin A1c 4.6 - 6.5 % 5.9 6.0 5.8 5.7 5.6    TCO2 0 - 100 mmol/L - - - - -       Pulmonary Assessment Scores: Pulmonary Assessment Scores    Row Name 11/25/17 1158 02/16/18 1359       ADL UCSD   ADL Phase  Entry  Entry    SOB Score total  75  57    Rest  0  0    Walk  3  2    Stairs  5  5    Bath  5  4    Dress  5  5    Shop  5  4      CAT Score   CAT Score  20  22      mMRC Score   mMRC Score  -  0       Pulmonary Function Assessment: Pulmonary Function Assessment - 02/16/18 1425      Breath   Bilateral Breath Sounds  Clear    Shortness of Breath  Limiting activity;No       Exercise Target Goals: Exercise Program Goal: Individual exercise prescription set using results from initial 6 min walk test and THRR while considering  patient's activity barriers and safety.   Exercise Prescription Goal: Initial exercise prescription builds to 30-45 minutes a day of aerobic activity, 2-3 days per week.  Home exercise guidelines will be given to patient during program as part of exercise prescription that the participant will acknowledge.  Activity Barriers & Risk Stratification: Activity Barriers & Cardiac Risk Stratification - 02/16/18 1444      Activity Barriers & Cardiac Risk Stratification   Activity Barriers  Deconditioning;Muscular Weakness;Balance Concerns;History of Falls;Other (comment);Decreased Ventricular Function    Comments  bilateral leg pain when walking       6 Minute Walk: 6 Minute Walk    Row Name 02/16/18 1442         6 Minute Walk   Phase  Initial     Distance  830 feet     Walk Time  4.55 minutes     # of Rest Breaks  1 1:27     MPH  2.07     METS  1.05     RPE  11     Perceived Dyspnea   0     VO2 Peak  3.67     Symptoms  Yes (comment)     Comments  leg fatigued/painful from hips down back of both legs     Resting HR  64 bpm     Resting BP  128/72     Resting Oxygen Saturation   96 %     Exercise Oxygen Saturation  during 6 min walk  93 %     Max Ex.  HR  88 bpm     Max  Ex. BP  132/72     2 Minute Post BP  128/70       Interval HR   1 Minute HR  78     2 Minute HR  84     3 Minute HR  87     4 Minute HR  81     5 Minute HR  75     6 Minute HR  88     2 Minute Post HR  67     Interval Heart Rate?  Yes       Interval Oxygen   Interval Oxygen?  Yes     Baseline Oxygen Saturation %  96 %     1 Minute Oxygen Saturation %  93 %     1 Minute Liters of Oxygen  0 L Room Air     2 Minute Oxygen Saturation %  93 %     2 Minute Liters of Oxygen  0 L     3 Minute Oxygen Saturation %  94 % rest 3:33-5:00     3 Minute Liters of Oxygen  0 L     4 Minute Oxygen Saturation %  94 %     4 Minute Liters of Oxygen  0 L     5 Minute Oxygen Saturation %  94 %     5 Minute Liters of Oxygen  0 L     6 Minute Oxygen Saturation %  96 %     6 Minute Liters of Oxygen  0 L     2 Minute Post Oxygen Saturation %  96 %     2 Minute Post Liters of Oxygen  0 L       Oxygen Initial Assessment: Oxygen Initial Assessment - 02/16/18 1423      Home Oxygen   Home Oxygen Device  None    Sleep Oxygen Prescription  None    Home Exercise Oxygen Prescription  None    Home at Rest Exercise Oxygen Prescription  None      Initial 6 min Walk   Oxygen Used  None      Program Oxygen Prescription   Program Oxygen Prescription  None      Intervention   Short Term Goals  To learn and understand importance of maintaining oxygen saturations>88%;To learn and demonstrate proper pursed lip breathing techniques or other breathing techniques.;To learn and understand importance of monitoring SPO2 with pulse oximeter and demonstrate accurate use of the pulse oximeter.    Long  Term Goals  Verbalizes importance of monitoring SPO2 with pulse oximeter and return demonstration;Maintenance of O2 saturations>88%;Exhibits proper breathing techniques, such as pursed lip breathing or other method taught during program session       Oxygen Re-Evaluation:   Oxygen Discharge (Final Oxygen  Re-Evaluation):   Initial Exercise Prescription: Initial Exercise Prescription - 02/16/18 1400      Date of Initial Exercise RX and Referring Provider   Date  02/16/18    Referring Provider  Glori Bickers MD      Treadmill   MPH  1.7    Grade  0.5    Minutes  15    METs  2.42      Recumbant Bike   Level  1    RPM  50    Minutes  15    METs  1.5      NuStep   Level  1    SPM  80    Minutes  15    METs  1.5      Prescription Details   Frequency (times per week)  3    Duration  Progress to 45 minutes of aerobic exercise without signs/symptoms of physical distress      Intensity   THRR 40-80% of Max Heartrate  --   92-119   Ratings of Perceived Exertion  11-13    Perceived Dyspnea  0-4      Progression   Progression  Continue to progress workloads to maintain intensity without signs/symptoms of physical distress.      Resistance Training   Training Prescription  Yes    Weight  3 lbs    Reps  10-15       Perform Capillary Blood Glucose checks as needed.  Exercise Prescription Changes: Exercise Prescription Changes    Row Name 02/16/18 1400             Response to Exercise   Blood Pressure (Admit)  128/72       Blood Pressure (Exercise)  132/72       Blood Pressure (Exit)  128/70       Heart Rate (Admit)  64 bpm       Heart Rate (Exercise)  88 bpm       Heart Rate (Exit)  67 bpm       Oxygen Saturation (Admit)  96 %       Oxygen Saturation (Exercise)  93 %       Oxygen Saturation (Exit)  96 %       Rating of Perceived Exertion (Exercise)  11       Perceived Dyspnea (Exercise)  0       Symptoms  leg fatigue/pain bilateral back of both legs       Comments  walk test results          Exercise Comments:   Exercise Goals and Review: Exercise Goals    Row Name 02/16/18 1447             Exercise Goals   Increase Physical Activity  Yes       Intervention  Provide advice, education, support and counseling about physical activity/exercise  needs.;Develop an individualized exercise prescription for aerobic and resistive training based on initial evaluation findings, risk stratification, comorbidities and participant's personal goals.       Expected Outcomes  Short Term: Attend rehab on a regular basis to increase amount of physical activity.;Long Term: Add in home exercise to make exercise part of routine and to increase amount of physical activity.;Long Term: Exercising regularly at least 3-5 days a week.       Increase Strength and Stamina  Yes       Intervention  Provide advice, education, support and counseling about physical activity/exercise needs.;Develop an individualized exercise prescription for aerobic and resistive training based on initial evaluation findings, risk stratification, comorbidities and participant's personal goals.       Expected Outcomes  Short Term: Increase workloads from initial exercise prescription for resistance, speed, and METs.;Long Term: Improve cardiorespiratory fitness, muscular endurance and strength as measured by increased METs and functional capacity (6MWT);Short Term: Perform resistance training exercises routinely during rehab and add in resistance training at home       Able to understand and use rate of perceived exertion (RPE) scale  Yes       Intervention  Provide education and explanation on how  to use RPE scale       Expected Outcomes  Short Term: Able to use RPE daily in rehab to express subjective intensity level;Long Term:  Able to use RPE to guide intensity level when exercising independently       Able to understand and use Dyspnea scale  Yes       Intervention  Provide education and explanation on how to use Dyspnea scale       Expected Outcomes  Short Term: Able to use Dyspnea scale daily in rehab to express subjective sense of shortness of breath during exertion;Long Term: Able to use Dyspnea scale to guide intensity level when exercising independently       Knowledge and  understanding of Target Heart Rate Range (THRR)  Yes       Intervention  Provide education and explanation of THRR including how the numbers were predicted and where they are located for reference       Expected Outcomes  Short Term: Able to state/look up THRR;Short Term: Able to use daily as guideline for intensity in rehab;Long Term: Able to use THRR to govern intensity when exercising independently       Able to check pulse independently  Yes       Intervention  Provide education and demonstration on how to check pulse in carotid and radial arteries.;Review the importance of being able to check your own pulse for safety during independent exercise       Expected Outcomes  Short Term: Able to explain why pulse checking is important during independent exercise;Long Term: Able to check pulse independently and accurately       Understanding of Exercise Prescription  Yes       Intervention  Provide education, explanation, and written materials on patient's individual exercise prescription       Expected Outcomes  Short Term: Able to explain program exercise prescription;Long Term: Able to explain home exercise prescription to exercise independently          Exercise Goals Re-Evaluation :   Discharge Exercise Prescription (Final Exercise Prescription Changes): Exercise Prescription Changes - 02/16/18 1400      Response to Exercise   Blood Pressure (Admit)  128/72    Blood Pressure (Exercise)  132/72    Blood Pressure (Exit)  128/70    Heart Rate (Admit)  64 bpm    Heart Rate (Exercise)  88 bpm    Heart Rate (Exit)  67 bpm    Oxygen Saturation (Admit)  96 %    Oxygen Saturation (Exercise)  93 %    Oxygen Saturation (Exit)  96 %    Rating of Perceived Exertion (Exercise)  11    Perceived Dyspnea (Exercise)  0    Symptoms  leg fatigue/pain bilateral back of both legs    Comments  walk test results       Nutrition:  Target Goals: Understanding of nutrition guidelines, daily intake of  sodium <1514m, cholesterol <2065m calories 30% from fat and 7% or less from saturated fats, daily to have 5 or more servings of fruits and vegetables.  Biometrics: Pre Biometrics - 02/16/18 1447      Pre Biometrics   Height  5' 1.5" (1.562 m)    Weight  132 lb 3.2 oz (60 kg)    Waist Circumference  30 inches    Hip Circumference  39 inches    Waist to Hip Ratio  0.77 %    BMI (Calculated)  24.58[place]  Single Leg Stand  0.54 seconds        Nutrition Therapy Plan and Nutrition Goals: Nutrition Therapy & Goals - 02/16/18 1426      Personal Nutrition Goals   Nutrition Goal  Learn to eat a heart healthy diet.    Comments  She is scared to eat anything with sodium in it. She does not eat anything fried and she does not eat sweets.      Intervention Plan   Intervention  Prescribe, educate and counsel regarding individualized specific dietary modifications aiming towards targeted core components such as weight, hypertension, lipid management, diabetes, heart failure and other comorbidities.    Expected Outcomes  Short Term Goal: Understand basic principles of dietary content, such as calories, fat, sodium, cholesterol and nutrients.;Long Term Goal: Adherence to prescribed nutrition plan.       Nutrition Assessments: Nutrition Assessments - 02/16/18 1402      MEDFICTS Scores   Pre Score  0       Nutrition Goals Re-Evaluation:   Nutrition Goals Discharge (Final Nutrition Goals Re-Evaluation):   Psychosocial: Target Goals: Acknowledge presence or absence of significant depression and/or stress, maximize coping skills, provide positive support system. Participant is able to verbalize types and ability to use techniques and skills needed for reducing stress and depression.   Initial Review & Psychosocial Screening: Initial Psych Review & Screening - 02/16/18 1425      Initial Review   Current issues with  None Identified      Family Dynamics   Good Support System?  Yes     Comments  Her daughter in law and her neighbor      Barriers   Psychosocial barriers to participate in program  There are no identifiable barriers or psychosocial needs.;The patient should benefit from training in stress management and relaxation.      Screening Interventions   Interventions  Encouraged to exercise    Expected Outcomes  Short Term goal: Utilizing psychosocial counselor, staff and physician to assist with identification of specific Stressors or current issues interfering with healing process. Setting desired goal for each stressor or current issue identified.;Long Term Goal: Stressors or current issues are controlled or eliminated.;Short Term goal: Identification and review with participant of any Quality of Life or Depression concerns found by scoring the questionnaire.;Long Term goal: The participant improves quality of Life and PHQ9 Scores as seen by post scores and/or verbalization of changes       Quality of Life Scores:  Scores of 19 and below usually indicate a poorer quality of life in these areas.  A difference of  2-3 points is a clinically meaningful difference.  A difference of 2-3 points in the total score of the Quality of Life Index has been associated with significant improvement in overall quality of life, self-image, physical symptoms, and general health in studies assessing change in quality of life.  PHQ-9: Recent Review Flowsheet Data    Depression screen [facility] 2/9 02/16/2018 11/25/2017 04/21/2017 07/30/2016 04/18/2016   Decreased Interest 3 1 0 0 0   Down, Depressed, Hopeless 0 0 0 0 0   PHQ - 2 Score 3 1 0 0 0   Altered sleeping 0 2 - 0 -   Tired, decreased energy 3 3 - 0 -   Change in appetite 2 0 - 0 -   Feeling bad or failure about yourself  0 0 - 0 -   Trouble concentrating 0 0 - 0 -   Moving slowly  or fidgety/restless 0 0 - 0 -   Suicidal thoughts 0 0 - 0 -   PHQ-9 Score 8 6 - 0 -   Difficult doing work/chores Somewhat difficult Somewhat difficult - -  -     Interpretation of Total Score  Total Score Depression Severity:  1-4 = Minimal depression, 5-9 = Mild depression, 10-14 = Moderate depression, 15-19 = Moderately severe depression, 20-27 = Severe depression   Psychosocial Evaluation and Intervention:   Psychosocial Re-Evaluation:   Psychosocial Discharge (Final Psychosocial Re-Evaluation):   Education: Education Goals: Education classes will be provided on a weekly basis, covering required topics. Participant will state understanding/return demonstration of topics presented.  Learning Barriers/Preferences: Learning Barriers/Preferences - 02/16/18 1403      Learning Barriers/Preferences   Learning Barriers  None    Learning Preferences  Video       Education Topics:  Initial Evaluation Education: - Verbal, written and demonstration of respiratory meds, oximetry and breathing techniques. Instruction on use of nebulizers and MDIs and importance of monitoring MDI activations.   Pulmonary Rehab from 02/16/2018 in [facility] Cardiac and Pulmonary Rehab  Date  02/16/18  Educator  [facility]  Instruction Review Code  1- Verbalizes Understanding      General Nutrition Guidelines/Fats and Fiber: -Group instruction provided by verbal, written material, models and posters to present the general guidelines for heart healthy nutrition. Gives an explanation and review of dietary fats and fiber.   Cardiac Rehab from 09/07/2014 in [facility] Cardiac and Pulmonary Rehab  Date  08/29/14  Educator  C. Joneen Caraway, [place]  Instruction Review Code (retired)  2- meets goals/outcomes      Controlling Sodium/Reading Food Labels: -Group verbal and written material supporting the discussion of sodium use in heart healthy nutrition. Review and explanation with models, verbal and written materials for utilization of the food label.   Cardiac Rehab from 09/07/2014 in [facility] Cardiac and Pulmonary Rehab  Date  09/05/14  Educator  CR  Instruction Review Code (retired)  2-  meets goals/outcomes      Exercise Physiology & General Exercise Guidelines: - Group verbal and written instruction with models to review the exercise physiology of the cardiovascular system and associated critical values. Provides general exercise guidelines with specific guidelines to those with heart or lung disease.    Cardiac Rehab from 09/07/2014 in [facility] Cardiac and Pulmonary Rehab  Date  07/27/14  Educator  Jazlynne Milliner Art Macmillan MS CEP  Instruction Review Code (retired)  2- meets goals/outcomes      Aerobic Exercise & Resistance Training: - Gives group verbal and written instruction on the various components of exercise. Focuses on aerobic and resistive training programs and the benefits of this training and how to safely progress through these programs.   Cardiac Rehab from 09/07/2014 in [facility] Cardiac and Pulmonary Rehab  Date  08/01/14  Educator  RM  Instruction Review Code (retired)  2- Statistician, Balance, Mind/Body Relaxation: Provides group verbal/written instruction on the benefits of flexibility and balance training, including mind/body exercise modes such as yoga, pilates and tai chi.  Demonstration and skill practice provided.   Cardiac Rehab from 09/07/2014 in [facility] Cardiac and Pulmonary Rehab  Date  08/03/14  Educator  RM  Instruction Review Code (retired)  2- meets goals/outcomes      Stress and Anxiety: - Provides group verbal and written instruction about the health risks of elevated stress and causes of high stress.  Discuss the correlation between heart/lung disease  and anxiety and treatment options. Review healthy ways to manage with stress and anxiety.   Cardiac Rehab from 09/07/2014 in [facility] Cardiac and Pulmonary Rehab  Date  07/20/14  Educator  Lucianne Lei, LCSW  Instruction Review Code (retired)  2- meets goals/outcomes      Depression: - Provides group verbal and written instruction on the correlation between heart/lung disease  and depressed mood, treatment options, and the stigmas associated with seeking treatment.   Cardiac Rehab from 09/07/2014 in [facility] Cardiac and Pulmonary Rehab  Date  09/07/14  Educator  [facility]  Instruction Review Code (retired)  2- meets goals/outcomes      Exercise & Equipment Safety: - Individual verbal instruction and demonstration of equipment use and safety with use of the equipment.   Pulmonary Rehab from 02/16/2018 in [facility] Cardiac and Pulmonary Rehab  Date  02/16/18  Educator  [facility]  Instruction Review Code  1- Verbalizes Understanding      Infection Prevention: - Provides verbal and written material to individual with discussion of infection control including proper hand washing and proper equipment cleaning during exercise session.   Pulmonary Rehab from 02/16/2018 in [facility] Cardiac and Pulmonary Rehab  Date  02/16/18  Educator  [facility]  Instruction Review Code  1- Verbalizes Understanding      Falls Prevention: - Provides verbal and written material to individual with discussion of falls prevention and safety.   Pulmonary Rehab from 02/16/2018 in [facility] Cardiac and Pulmonary Rehab  Date  02/16/18  Educator  [facility]  Instruction Review Code  1- Verbalizes Understanding      Diabetes: - Individual verbal and written instruction to review signs/symptoms of diabetes, desired ranges of glucose level fasting, after meals and with exercise. Advice that pre and post exercise glucose checks will be done for 3 sessions at entry of program.   Chronic Lung Diseases: - Group verbal and written instruction to review updates, respiratory medications, advancements in procedures and treatments. Discuss use of supplemental oxygen including available portable oxygen systems, continuous and intermittent flow rates, concentrators, personal use and safety guidelines. Review proper use of inhaler and spacers. Provide informative websites for self-education.    Energy Conservation: - Provide group verbal and  written instruction for methods to conserve energy, plan and organize activities. Instruct on pacing techniques, use of adaptive equipment and posture/positioning to relieve shortness of breath.   Triggers and Exacerbations: - Group verbal and written instruction to review types of environmental triggers and ways to prevent exacerbations. Discuss weather changes, air quality and the benefits of nasal washing. Review warning signs and symptoms to help prevent infections. Discuss techniques for effective airway clearance, coughing, and vibrations.   AED/CPR: - Group verbal and written instruction with the use of models to demonstrate the basic use of the AED with the basic ABC's of resuscitation.   Anatomy and Physiology of the Lungs: - Group verbal and written instruction with the use of models to provide basic lung anatomy and physiology related to function, structure and complications of lung disease.   Anatomy & Physiology of the Heart: - Group verbal and written instruction and models provide basic cardiac anatomy and physiology, with the coronary electrical and arterial systems. Review of Valvular disease and Heart Failure   Cardiac Rehab from 09/07/2014 in [facility]  Date  08/15/14  Educator  SB  Instruction Review Code (retired)  2- meets goals/outcomes      Cardiac Medications: - Group verbal and written instruction to review commonly  prescribed medications for heart disease. Reviews the medication, class of the drug, and side effects.   Cardiac Rehab from 09/07/2014 in [facility] Cardiac and Pulmonary Rehab  Date  07/25/14  Educator  SB  Instruction Review Code (retired)  2- meets goals/outcomes      Know Your Numbers and Risk Factors: -Group verbal and written instruction about important numbers in your health.  Discussion of what are risk factors and how they play a role in the disease process.  Review of Cholesterol, Blood Pressure, Diabetes, and BMI and  the role they play in your overall health.   Sleep Hygiene: -Provides group verbal and written instruction about how sleep can affect your health.  Define sleep hygiene, discuss sleep cycles and impact of sleep habits. Review good sleep hygiene tips.    Other: -Provides group and verbal instruction on various topics (see comments)    Knowledge Questionnaire Score: Knowledge Questionnaire Score - 02/16/18 1401      Knowledge Questionnaire Score   Pre Score  13/18   Reviewed with patient       Core Components/Risk Factors/Patient Goals at Admission: Personal Goals and Risk Factors at Admission - 02/16/18 1427      Core Components/Risk Factors/Patient Goals on Admission    Weight Management  Yes;Weight Loss    Intervention  Weight Management: Develop a combined nutrition and exercise program designed to reach desired caloric intake, while maintaining appropriate intake of nutrient and fiber, sodium and fats, and appropriate energy expenditure required for the weight goal.;Weight Management: Provide education and appropriate resources to help participant work on and attain dietary goals.    Admit Weight  132 lb (59.9 kg)    Goal Weight: Short Term  127 lb (57.6 kg)    Goal Weight: Long Term  120 lb (54.4 kg)    Expected Outcomes  Short Term: Continue to assess and modify interventions until short term weight is achieved;Long Term: Adherence to nutrition and physical activity/exercise program aimed toward attainment of established weight goal;Weight Loss: Understanding of general recommendations for a balanced deficit meal plan, which promotes 1-2 lb weight loss per week and includes a negative energy balance of [phone] kcal/d;Understanding recommendations for meals to include 15-35% energy as protein, 25-35% energy from fat, 35-60% energy from carbohydrates, less than 234m of dietary cholesterol, 20-35 gm of total fiber daily;Understanding of distribution of calorie intake throughout the  day with the consumption of 4-5 meals/snacks    Improve shortness of breath with ADL's  Yes    Intervention  Provide education, individualized exercise plan and daily activity instruction to help decrease symptoms of SOB with activities of daily living.    Expected Outcomes  Short Term: Improve cardiorespiratory fitness to achieve a reduction of symptoms when performing ADLs;Long Term: Be able to perform more ADLs without symptoms or delay the onset of symptoms    Hypertension  Yes    Intervention  Provide education on lifestyle modifcations including regular physical activity/exercise, weight management, moderate sodium restriction and increased consumption of fresh fruit, vegetables, and low fat dairy, alcohol moderation, and smoking cessation.;Monitor prescription use compliance.    Expected Outcomes  Short Term: Continued assessment and intervention until BP is < 140/929mHG in hypertensive participants. < 130/8030mG in hypertensive participants with diabetes, heart failure or chronic kidney disease.;Long Term: Maintenance of blood pressure at goal levels.       Core Components/Risk Factors/Patient Goals Review:    Core Components/Risk Factors/Patient Goals at Discharge (Final Review):  ITP Comments: ITP Comments    Row Name 11/25/17 1147 02/16/18 1359 02/23/18 0858       ITP Comments  Medical Evaluation completed. Chart sent for review and changes to Dr. Emily Filbert Director of [place]. Diagnosis can be found in [facility] encounter 08/21/17  Medical Evaluation completed. Chart sent for review and changes to Dr. Emily Filbert Director of [place]. Diagnosis can be found in [facility] encounter 08/21/17  30 day review completed. ITP sent to Dr. Emily Filbert Director of [place]. Continue with ITP unless changes are made by physician.        Comments: 30 day review

## 2018-02-24 ENCOUNTER — Ambulatory Visit
Admission: RE | Admit: 2018-02-24 | Discharge: 2018-02-24 | Disposition: A | Discharge status: Home / Self Care | Payer: Medicare Other | Source: Ambulatory Visit | Attending: Internal Medicine | Admitting: Internal Medicine

## 2018-02-24 DIAGNOSIS — M79661 Pain in right lower leg: Secondary | ICD-10-CM | POA: Diagnosis not present

## 2018-02-24 DIAGNOSIS — M79662 Pain in left lower leg: Secondary | ICD-10-CM | POA: Insufficient documentation

## 2018-02-24 DIAGNOSIS — M79669 Pain in unspecified lower leg: Secondary | ICD-10-CM

## 2018-02-24 DIAGNOSIS — I70213 Atherosclerosis of native arteries of extremities with intermittent claudication, bilateral legs: Secondary | ICD-10-CM | POA: Diagnosis not present

## 2018-02-25 ENCOUNTER — Ambulatory Visit: Payer: Medicare Other

## 2018-02-25 ENCOUNTER — Other Ambulatory Visit
Admission: RE | Admit: 2018-02-25 | Discharge: 2018-02-25 | Disposition: A | Discharge status: Home / Self Care | Payer: Medicare Other | Source: Ambulatory Visit | Attending: Internal Medicine | Admitting: Internal Medicine

## 2018-02-25 ENCOUNTER — Other Ambulatory Visit: Payer: Self-pay | Admitting: Oncology

## 2018-02-25 DIAGNOSIS — I5022 Chronic systolic (congestive) heart failure: Secondary | ICD-10-CM | POA: Diagnosis not present

## 2018-02-25 DIAGNOSIS — D509 Iron deficiency anemia, unspecified: Secondary | ICD-10-CM | POA: Insufficient documentation

## 2018-02-25 LAB — BASIC METABOLIC PANEL
Anion gap: 8 (ref 5–15)
BUN: 42 mg/dL — ABNORMAL HIGH (ref 8–23)
CO2: 27 mmol/L (ref 22–32)
Calcium: 8.7 mg/dL — ABNORMAL LOW (ref 8.9–10.3)
Chloride: 103 mmol/L (ref 98–111)
Creatinine, Ser: 1.35 mg/dL — ABNORMAL HIGH (ref 0.44–1.00)
GFR calc Af Amer: 41 mL/min — ABNORMAL LOW (ref >60)
GFR calc non Af Amer: 35 mL/min — ABNORMAL LOW (ref >60)
Glucose, Bld: 96 mg/dL (ref 70–99)
Potassium: 4.7 mmol/L (ref 3.5–5.1)
Sodium: 138 mmol/L (ref 135–145)

## 2018-02-26 ENCOUNTER — Inpatient Hospital Stay: Payer: Medicare Other

## 2018-02-26 ENCOUNTER — Telehealth (HOSPITAL_COMMUNITY): Payer: Self-pay

## 2018-02-26 NOTE — Telephone Encounter (Signed)
-----   Message from Jolaine Artist, MD sent at 02/26/2018  1:46 PM EST ----- ABIS normal

## 2018-02-26 NOTE — Telephone Encounter (Signed)
Pt aware of results and appreciative.  

## 2018-02-27 ENCOUNTER — Ambulatory Visit: Payer: Medicare Other

## 2018-03-02 ENCOUNTER — Ambulatory Visit: Payer: Medicare Other

## 2018-03-05 ENCOUNTER — Inpatient Hospital Stay: Payer: Medicare Other

## 2018-03-06 ENCOUNTER — Encounter: Payer: Self-pay | Admitting: Cardiovascular Disease

## 2018-03-06 ENCOUNTER — Ambulatory Visit (INDEPENDENT_AMBULATORY_CARE_PROVIDER_SITE_OTHER): Payer: Medicare Other | Admitting: Cardiovascular Disease

## 2018-03-06 ENCOUNTER — Ambulatory Visit: Payer: Medicare Other

## 2018-03-06 ENCOUNTER — Other Ambulatory Visit: Payer: Self-pay | Admitting: Internal Medicine

## 2018-03-06 VITALS — BP 124/60 | HR 70 | Ht 62.0 in | Wt 135.8 lb

## 2018-03-06 DIAGNOSIS — I10 Essential (primary) hypertension: Secondary | ICD-10-CM | POA: Diagnosis not present

## 2018-03-06 DIAGNOSIS — I4819 Other persistent atrial fibrillation: Secondary | ICD-10-CM

## 2018-03-06 DIAGNOSIS — I2510 Atherosclerotic heart disease of native coronary artery without angina pectoris: Secondary | ICD-10-CM | POA: Diagnosis not present

## 2018-03-06 DIAGNOSIS — I5032 Chronic diastolic (congestive) heart failure: Secondary | ICD-10-CM | POA: Diagnosis not present

## 2018-03-06 DIAGNOSIS — I340 Nonrheumatic mitral (valve) insufficiency: Secondary | ICD-10-CM | POA: Diagnosis not present

## 2018-03-06 MED ORDER — TRAMADOL HCL 50 MG PO TABS: 50.0000 mg | ORAL_TABLET | Freq: Every day | ORAL | 1 refills | Status: DC | PRN

## 2018-03-06 NOTE — Progress Notes (Signed)
Cardiology Office Note   Date:  03/06/2018   ID:  Ruth Roth, DOB Nov 04, 1928, MRN [id number]  PCP:  Einar Pheasant, MD  Cardiologist:   [name], MD   Chief Complaint  Patient presents with  . other    2 month f/u bilateral leg pain. Meds reviewed verbally with pt.      History of Present Illness: Ruth Roth is a 84 y.o. female who presents for a follow-up visit regarding coronary artery disease ,chronic diastolic heart failure, mitral regurgitation and persistent atrial fibrillation .  She has known history of hypertension, peptic ulcer disease, fibrocystic breast disease, inflammatory arthritis, autoimmune hepatitis and neuropathy.  She had a small non-ST elevation myocardial infarction in March 2016 with acute diastolic heart failure after a GI illness.  Echocardiogram showed normal LV systolic function. Cardiac catheterization showed showed 60% ostial left circumflex hazy stenosis which was possibly the culprit. Mild LAD/RCA disease. She was treated medically.   She had cardioversion 3 times in 2018/2019. Due to recurrent atrial fibrillation, I elected for rate control and discontinued amiodarone.  She had worsening heart failure.  Right and left cardiac catheterization in June  showed mild nonobstructive coronary artery disease.  Ejection fraction was 50 to 55% with severe mitral regurgitation.  Right heart catheterization showed severely elevated filling pressures with moderate to severe pulmonary hypertension and severely reduced cardiac output.  I transferred the patient to [facility] where she was effectively diuresed with milrinone with subsequent improvement in symptoms.  She had a transesophageal echocardiogram done which showed normal LV systolic function with only mild mitral regurgitation.  The mitral regurgitation was felt to be functional with subsequent improvement after diuresis.  During last visit, she was surprisingly found to be in sinus rhythm and  bradycardic.  I decreased the dose of metoprolol to 50 mg twice daily.  She has been doing well with no chest pain or shortness of breath.  She continues to have mild bilateral leg edema.  She complains of back pain radiating to her right leg.  She had an ABI done which was normal bilaterally.  Past Medical History:  Diagnosis Date  . (HFpEF) heart failure with preserved ejection fraction ([place])    a. 07/2017 Echo: EF 50-55%, no rwma, mild AS/AI, mod MR, mod dil LA, mod TR, PASP 48mmHg.  . Autoimmune hepatitis ([place])    followed by Dr [name]  . Fibrocystic breast disease   . Glaucoma   . Hypercholesterolemia   . Hypertension   . Hypothyroidism   . Inflammatory arthritis   . MI (myocardial infarction) ([place])   . Neuropathy   . Non-obstructive Coronary artery disease    a. 05/2014 NSTEMI/Cath: LM nl, LAD mild dzs, LCX 60ost hazy (? culprit), RCA mild dzs. EF 60% by echo-->Med Rx; b. 08/2017 Cath: LM 30ost, LAD min irregs, LCX small, 40ost/p, RCA large, min irregs, RPDA/RPL1 nl, EF 50-55%. 4+MR.  . Osteoarthritis   . Permanent atrial fibrillation    a. CHA2DS2VASc = 6-->Xarelto.  . 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 Severe mitral regurgitation    a. 07/2017 Echo: Mod MR; b. 08/2017 Cath: 4+/Severe MR.    Past Surgical History:  Procedure Laterality Date  . ABDOMINAL HYSTERECTOMY  1963   partial, secondary to fibroids  . APPENDECTOMY    . Billroth    . CARDIAC CATHETERIZATION  05/12/14   [facility]  . CARDIOVERSION N/A 12/26/2016  Procedure: CARDIOVERSION;  Surgeon: [name], MD;  Location: [facility] ORS;  Service: Cardiovascular;  Laterality: N/A;  . CARDIOVERSION N/A 05/16/2017   Procedure: CARDIOVERSION;  Surgeon: [name], MD;  Location: [facility] ORS;  Service: Cardiovascular;  Laterality: N/A;  . CARDIOVERSION N/A 06/09/2017   Procedure: CARDIOVERSION;  Surgeon: [name], MD;  Location: [facility] ORS;  Service:  Cardiovascular;  Laterality: N/A;  . CHOLECYSTECTOMY  1998  . COLONOSCOPY  2009  . RIGHT/LEFT HEART CATH AND CORONARY ANGIOGRAPHY Bilateral 08/21/2017   Procedure: RIGHT/LEFT HEART CATH AND CORONARY ANGIOGRAPHY;  Surgeon: [name], MD;  Location: [place] CV LAB;  Service: Cardiovascular;  Laterality: Bilateral;  . TEE WITHOUT CARDIOVERSION N/A 08/25/2017   Procedure: TRANSESOPHAGEAL ECHOCARDIOGRAM (TEE);  Surgeon: Jolaine Artist, MD;  Location: [facility] ENDOSCOPY;  Service: Cardiovascular;  Laterality: N/A;  . UPPER GI ENDOSCOPY  2004     Current Outpatient Medications  Medication Sig Dispense Refill  . brimonidine-timolol (COMBIGAN) 0.2-0.5 % ophthalmic solution Place 1 drop into the left eye 2 (two) times daily.     . dorzolamide (TRUSOPT) 2 % ophthalmic solution Place 1 drop into the left eye 2 (two) times daily.    . fluticasone (FLONASE) 50 MCG/ACT nasal spray Place 2 sprays into both nostrils daily as needed for allergies.     Marland Kitchen gabapentin (NEURONTIN) 300 MG capsule TAKE 2 CAPSULES BY MOUTH FOUR TIMES DAILY 720 capsule 0  . ipratropium (ATROVENT) 0.06 % nasal spray Place 2 sprays into both nostrils daily as needed for rhinitis.     Marland Kitchen latanoprost (XALATAN) 0.005 % ophthalmic solution Place 1 drop into both eyes at bedtime.     Marland Kitchen levothyroxine (SYNTHROID, LEVOTHROID) 125 MCG tablet TAKE 1 TABLET(125 MCG) BY MOUTH DAILY BEFORE BREAKFAST 90 tablet 0  . metoprolol tartrate (LOPRESSOR) 50 MG tablet TAKE 1 TABLET(50 MG) BY MOUTH TWICE DAILY 180 tablet 3  . pantoprazole (PROTONIX) 40 MG tablet Take 1 tablet (40 mg total) by mouth every evening. 90 tablet 3  . Rivaroxaban (XARELTO) 15 MG TABS tablet Take 1 tablet (15 mg total) by mouth daily with supper. 30 tablet 6  . torsemide (DEMADEX) 20 MG tablet Take 1 tablet (20 mg total) by mouth daily. 30 tablet 3  . traMADol (ULTRAM) 50 MG tablet Take 1 tablet (50 mg total) by mouth daily as needed (for pain.). 30 tablet 0  . ursodiol  (ACTIGALL) 300 MG capsule Take 300-600 mg by mouth See admin instructions. Take 2 capsules (600 mg) daily after lunch & 1 capsule (300 mg) at night.     No current facility-administered medications for this visit.     Allergies:   Statins; Haldol [haloperidol lactate]; Librax [chlordiazepoxide-clidinium]; Plavix [clopidogrel bisulfate]; and Ramipril    Social History:  The patient  reports that she has never smoked. She has never used smokeless tobacco. She reports that she does not drink alcohol or use drugs.   Family History:  The patient's family history includes Colon cancer in her daughter; Esophageal cancer in her brother; Ovarian cancer in her daughter; Stroke in her mother.    ROS:  Please see the history of present illness.   Otherwise, review of systems are positive for none.   All other systems are reviewed and negative.    PHYSICAL EXAM: VS:  BP 124/60 (BP Location: Left Arm, Patient Position: Sitting, Cuff Size: Normal)   Pulse 70   Ht 5\' 2"  (1.575 m)   Wt 135 lb 12 oz (  61.6 kg)   BMI 24.83 kg/m  , BMI Body mass index is 24.83 kg/m. GEN: Well nourished, well developed, in no acute distress  HEENT: normal  Neck: No JVD, carotid bruits, or masses Cardiac: Regular rate and rhythm ; no rubs, or gallops,no edema . 1/ 6 systolic ejection murmur at the aortic area.  Trace bilateral leg edema Respiratory: Clear to auscultation GI: soft, nontender, nondistended, + BS MS: no deformity or atrophy  Skin: warm and dry, no rash Neuro:  Strength and sensation are intact Psych: euthymic mood, full affect   EKG:  EKG is ordered today. The ekg ordered today demonstrates sinus rhythm with sinus arrhythmia.  Recent Labs: 08/27/2017: Magnesium 2.2 01/27/2018: ALT 14; TSH 2.26 02/11/2018: B Natriuretic Peptide 713.5 02/17/2018: Hemoglobin 9.8; Platelets 268 02/25/2018: BUN 42; Creatinine, Ser 1.35; Potassium 4.7; Sodium 138    Lipid Panel    Component Value Date/Time   CHOL 144  01/27/2018 1009   TRIG 107.0 01/27/2018 1009   HDL 44.10 01/27/2018 1009   CHOLHDL 3 01/27/2018 1009   VLDL 21.4 01/27/2018 1009   LDLCALC 78 01/27/2018 1009   LDLDIRECT 143.5 12/17/2012 0955      Wt Readings from Last 3 Encounters:  03/06/18 135 lb 12 oz (61.6 kg)  02/18/18 135 lb (61.2 kg)  02/17/18 134 lb (60.8 kg)       ASSESSMENT AND PLAN:  1.  Persistent atrial fibrillation: She is still maintaining in sinus rhythm without any antiarrhythmic medication.  Continue metoprolol 50 mg twice daily. Continue anticoagulation with Xarelto.  2. Chronic diastolic heart failure: She appears to be euvolemic on small dose torsemide.  3. Essential hypertension: Blood pressure is controlled on metoprolol.  4.  Functional mitral regurgitation: Severe MR was only in the setting of severe volume overload.  MR was only mild on most recent transesophageal echo.  5.  Back pain radiating to her right leg: I suspect spinal stenosis.  Recent ABI was normal with normal waveforms.  I gave her a refill on tramadol.   Disposition:   FU with me in 4 months.  Signed,  [name], MD  03/06/2018 2:31 PM    [place]

## 2018-03-06 NOTE — Patient Instructions (Signed)
Medication Instructions:  No changes  If you need a refill on your cardiac medications before your next appointment, please call your pharmacy.   Lab work: None ordered  Testing/Procedures: None ordered  Follow-Up: At [facility], you and your health needs are our priority.  As part of our continuing mission to provide you with exceptional heart care, we have created designated Provider Care Teams.  These Care Teams include your primary Cardiologist (physician) and Advanced Practice Providers (APPs -  Physician Assistants and Nurse Practitioners) who all work together to provide you with the care you need, when you need it. You will need a follow up appointment in 4 months.  Please call our office 2 months in advance to schedule this appointment.  You may see [name], MD or one of the following Advanced Practice Providers on your designated Care Team:   Murray Hodgkins, NP Christell Faith, PA-C . Marrianne Mood, PA-C

## 2018-03-09 ENCOUNTER — Ambulatory Visit: Payer: Medicare Other

## 2018-03-09 ENCOUNTER — Encounter: Payer: Medicare Other | Admitting: *Deleted

## 2018-03-09 DIAGNOSIS — I5032 Chronic diastolic (congestive) heart failure: Secondary | ICD-10-CM | POA: Diagnosis not present

## 2018-03-09 NOTE — Progress Notes (Signed)
Daily Session Note  Patient Details  Name: Ruth Roth MRN: [id number] Date of Birth: 1928-03-16 Referring Provider:     Pulmonary Rehab from 02/16/2018 in [facility] Cardiac and Pulmonary Rehab  Referring Provider  Glori Bickers MD      Encounter Date: 03/09/2018  Check In: Session Check In - 03/09/18 1141      Check-In   Supervising physician immediately available to respond to emergencies  [name] immediately available ER MD    Physician(s)  Dr. Cinda Quest and Quentin Cornwall    Location  [facility]-Cardiac & Pulmonary Rehab    Staff Present  Renita Papa, RN BSN;Jeanna Durrell BS, Exercise Physiologist;Joseph Tessie Fass RCP,RRT,BSRT    Medication changes reported      No    Fall or balance concerns reported     No    Warm-up and Cool-down  Performed as group-led instruction    Resistance Training Performed  Yes    VAD Patient?  No    PAD/SET Patient?  No      Pain Assessment   Currently in Pain?  No/denies          Social History   Tobacco Use  Smoking Status Never Smoker  Smokeless Tobacco Never Used    Goals Met:  Proper associated with RPD/PD & O2 Sat Independence with exercise equipment Using PLB without cueing & demonstrates good technique Exercise tolerated well No report of cardiac concerns or symptoms Strength training completed today  Goals Unmet:  Not Applicable  Comments: First full day of exercise!  Patient was oriented to gym and equipment including functions, settings, policies, and procedures.  Patient's individual exercise prescription and treatment plan were reviewed.  All starting workloads were established based on the results of the 6 minute walk test done at initial orientation visit.  The plan for exercise progression was also introduced and progression will be customized based on patient's performance and goals.    Dr. Emily Filbert is Medical Director for [place] and [name].

## 2018-03-11 ENCOUNTER — Other Ambulatory Visit: Payer: Self-pay | Admitting: Internal Medicine

## 2018-03-13 ENCOUNTER — Encounter: Payer: Medicare Other | Attending: Internal Medicine

## 2018-03-13 ENCOUNTER — Ambulatory Visit: Payer: Medicare Other

## 2018-03-13 DIAGNOSIS — I5032 Chronic diastolic (congestive) heart failure: Secondary | ICD-10-CM | POA: Insufficient documentation

## 2018-03-13 NOTE — Progress Notes (Signed)
Daily Session Note  Patient Details  Name: Ruth Roth MRN: [id number] Date of Birth: 1928-10-08 Referring Provider:     Pulmonary Rehab from 02/16/2018 in [facility] Cardiac and Pulmonary Rehab  Referring Provider  Glori Bickers MD      Encounter Date: 03/13/2018  Check In: Session Check In - 03/13/18 1142      Check-In   Supervising physician immediately available to respond to emergencies  [name] immediately available ER MD    Physician(s)  Dr. Cinda Quest and Joni Fears    Location  [facility]-Cardiac & Pulmonary Rehab    Staff Present  Renita Papa, RN BSN;Jayliana Valencia RCP,RRT,BSRT;Mary Kellie Shropshire, RN, BSN, MA    Medication changes reported      No    Fall or balance concerns reported     No    Warm-up and Cool-down  Performed as group-led instruction    Resistance Training Performed  Yes    VAD Patient?  No    PAD/SET Patient?  No      Pain Assessment   Currently in Pain?  No/denies          Social History   Tobacco Use  Smoking Status Never Smoker  Smokeless Tobacco Never Used    Goals Met:  Independence with exercise equipment Exercise tolerated well No report of cardiac concerns or symptoms Strength training completed today  Goals Unmet:  Not Applicable  Comments: Pt able to follow exercise prescription today without complaint.  Will continue to monitor for progression.    Dr. Emily Filbert is Medical Director for [place] and [name].

## 2018-03-16 ENCOUNTER — Ambulatory Visit: Payer: Medicare Other

## 2018-03-16 DIAGNOSIS — I5032 Chronic diastolic (congestive) heart failure: Secondary | ICD-10-CM | POA: Diagnosis not present

## 2018-03-16 NOTE — Progress Notes (Signed)
Daily Session Note  Patient Details  Name: Ruth Roth MRN: [id number] Date of Birth: 02/27/1929 Referring Provider:     Pulmonary Rehab from 02/16/2018 in [facility] Cardiac and Pulmonary Rehab  Referring Provider  Glori Bickers MD      Encounter Date: 03/16/2018  Check In:      Social History   Tobacco Use  Smoking Status Never Smoker  Smokeless Tobacco Never Used    Goals Met:  Proper associated with RPD/PD & O2 Sat Independence with exercise equipment Exercise tolerated well Strength training completed today  Goals Unmet:  Not Applicable  Comments: Pt able to follow exercise prescription today without complaint.  Will continue to monitor for progression.    Dr. Emily Filbert is Medical Director for [place] and [name].

## 2018-03-18 ENCOUNTER — Encounter: Payer: Medicare Other | Admitting: *Deleted

## 2018-03-18 ENCOUNTER — Ambulatory Visit: Payer: Medicare Other

## 2018-03-18 DIAGNOSIS — I5032 Chronic diastolic (congestive) heart failure: Secondary | ICD-10-CM | POA: Diagnosis not present

## 2018-03-18 NOTE — Progress Notes (Signed)
Daily Session Note  Patient Details  Name: Ruth Roth MRN: [id number] Date of Birth: 1929-02-16 Referring Provider:     Pulmonary Rehab from 02/16/2018 in [facility] Cardiac and Pulmonary Rehab  Referring Provider  Glori Bickers MD      Encounter Date: 03/18/2018  Check In: Session Check In - 03/18/18 1117      Check-In   Supervising physician immediately available to respond to emergencies  [name] immediately available ER MD    Physician(s)  Drs. Joycelyn Rua    Staff Present  Alberteen Sam, MA, RCEP, CCRP, Exercise Physiologist;Joseph Alcus Dad, RN BSN    Medication changes reported      No    Fall or balance concerns reported     No    Warm-up and Cool-down  Performed as group-led Higher education careers adviser Performed  Yes    VAD Patient?  No    PAD/SET Patient?  No      Pain Assessment   Currently in Pain?  No/denies          Social History   Tobacco Use  Smoking Status Never Smoker  Smokeless Tobacco Never Used    Goals Met:  Proper associated with RPD/PD & O2 Sat Independence with exercise equipment Using PLB without cueing & demonstrates good technique Exercise tolerated well No report of cardiac concerns or symptoms Strength training completed today  Goals Unmet:  Not Applicable  Comments: Pt able to follow exercise prescription today without complaint.  Will continue to monitor for progression.  Reviewed home exercise with pt today.  Pt plans to walk and use treadmill at home for exercise.  Reviewed THR, pulse, RPE, sign and symptoms, and when to call 911 or MD.  Also discussed weather considerations and indoor options.  Pt voiced understanding.   Dr. Emily Filbert is Medical Director for [place] and [name].

## 2018-03-20 ENCOUNTER — Encounter: Payer: Medicare Other | Admitting: *Deleted

## 2018-03-20 ENCOUNTER — Ambulatory Visit: Payer: Medicare Other

## 2018-03-20 DIAGNOSIS — I5032 Chronic diastolic (congestive) heart failure: Secondary | ICD-10-CM

## 2018-03-20 NOTE — Progress Notes (Signed)
Daily Session Note  Patient Details  Name: Ruth Roth MRN: [id number] Date of Birth: [birth year] Referring Provider:     Pulmonary Rehab from 02/16/2018 in [facility] Cardiac and Pulmonary Rehab  Referring Provider  Glori Bickers MD      Encounter Date: 03/20/2018  Check In: Session Check In - 03/20/18 1123      Check-In   Supervising physician immediately available to respond to emergencies  See telemetry face sheet for immediately available ER MD    Physician(s)  Drs. Forestine Na    Location  [facility]-Cardiac & Pulmonary Rehab    Staff Present  Renita Papa, RN BSN;Jessica Luan Pulling, MA, RCEP, CCRP, Exercise Physiologist;Joseph Tessie Fass RCP,RRT,BSRT    Medication changes reported      No    Fall or balance concerns reported     No    Warm-up and Cool-down  Performed as group-led instruction    Resistance Training Performed  Yes    VAD Patient?  No    PAD/SET Patient?  No      Pain Assessment   Currently in Pain?  No/denies          Social History   Tobacco Use  Smoking Status Never Smoker  Smokeless Tobacco Never Used    Goals Met:  Proper associated with RPD/PD & O2 Sat Independence with exercise equipment Using PLB without cueing & demonstrates good technique Exercise tolerated well No report of cardiac concerns or symptoms Strength training completed today  Goals Unmet:  Not Applicable  Comments: Pt able to follow exercise prescription today without complaint.  Will continue to monitor for progression.    Dr. Emily Filbert is Medical Director for [place] and [name].

## 2018-03-23 ENCOUNTER — Ambulatory Visit: Payer: Medicare Other

## 2018-03-23 DIAGNOSIS — I5032 Chronic diastolic (congestive) heart failure: Secondary | ICD-10-CM

## 2018-03-23 NOTE — Progress Notes (Signed)
Daily Session Note  Patient Details  Name: Ruth Roth MRN: [id number] Date of Birth: 09-06-1928 Referring Provider:     Pulmonary Rehab from 02/16/2018 in [facility] Cardiac and Pulmonary Rehab  Referring Provider  Glori Bickers MD      Encounter Date: 03/23/2018  Check In:      Social History   Tobacco Use  Smoking Status Never Smoker  Smokeless Tobacco Never Used    Goals Met:  Proper associated with RPD/PD & O2 Sat Independence with exercise equipment Exercise tolerated well Strength training completed today  Goals Unmet:  Not Applicable  Comments: Pt able to follow exercise prescription today without complaint.  Will continue to monitor for progression.    Dr. Emily Filbert is Medical Director for [place] and [name].

## 2018-03-23 NOTE — Progress Notes (Signed)
Pulmonary Individual Treatment Plan  Patient Details  Name: Ruth Roth MRN: [id number] Date of Birth: 10/09/1928 Referring Provider:     Pulmonary Rehab from 02/16/2018 in [facility]  Referring Provider  Bensimhon, Daniel MD      Initial Encounter Date:    Pulmonary Rehab from 02/16/2018 in [facility]  Date  02/16/18      Visit Diagnosis: Chronic diastolic heart failure (HCC)  Patient's Home Medications on Admission:  Current Outpatient Medications:  .  brimonidine-timolol (COMBIGAN) 0.2-0.5 % ophthalmic solution, Place 1 drop into the left eye 2 (two) times daily. , Disp: , Rfl:  .  dorzolamide (TRUSOPT) 2 % ophthalmic solution, Place 1 drop into the left eye 2 (two) times daily., Disp: , Rfl:  .  fluticasone (FLONASE) 50 MCG/ACT nasal spray, Place 2 sprays into both nostrils daily as needed for allergies. , Disp: , Rfl:  .  gabapentin (NEURONTIN) 300 MG capsule, TAKE 2 CAPSULES BY MOUTH FOUR TIMES DAILY, Disp: 720 capsule, Rfl: 0 .  ipratropium (ATROVENT) 0.06 % nasal spray, Place 2 sprays into both nostrils daily as needed for rhinitis. , Disp: , Rfl:  .  latanoprost (XALATAN) 0.005 % ophthalmic solution, Place 1 drop into both eyes at bedtime. , Disp: , Rfl:  .  levothyroxine (SYNTHROID, LEVOTHROID) 125 MCG tablet, TAKE 1 TABLET(125 MCG) BY MOUTH DAILY BEFORE BREAKFAST, Disp: 90 tablet, Rfl: 0 .  metoprolol tartrate (LOPRESSOR) 50 MG tablet, TAKE 1 TABLET(50 MG) BY MOUTH TWICE DAILY, Disp: 180 tablet, Rfl: 3 .  pantoprazole (PROTONIX) 40 MG tablet, Take 1 tablet (40 mg total) by mouth every evening., Disp: 90 tablet, Rfl: 3 .  ranitidine (ZANTAC) 150 MG tablet, TAKE 1 TABLET(150 MG) BY MOUTH DAILY 30 MINUTES BEFORE BREAKFAST, Disp: 30 tablet, Rfl: 0 .  Rivaroxaban (XARELTO) 15 MG TABS tablet, Take 1 tablet (15 mg total) by mouth daily with supper., Disp: 30 tablet, Rfl: 6 .  torsemide (DEMADEX) 20 MG tablet, Take 1 tablet (20 mg total)  by mouth daily., Disp: 30 tablet, Rfl: 3 .  traMADol (ULTRAM) 50 MG tablet, Take 1 tablet (50 mg total) by mouth daily as needed (for pain.)., Disp: 30 tablet, Rfl: 1 .  ursodiol (ACTIGALL) 300 MG capsule, Take 300-600 mg by mouth See admin instructions. Take 2 capsules (600 mg) daily after lunch & 1 capsule (300 mg) at night., Disp: , Rfl:   Past Medical History: Past Medical History:  Diagnosis Date  . (HFpEF) heart failure with preserved ejection fraction (HCC)    a. 07/2017 Echo: EF 50-55%, no rwma, mild AS/AI, mod MR, mod dil LA, mod TR, PASP 65mmHg.  . Autoimmune hepatitis (HCC)    followed by Dr Skulskie  . Fibrocystic breast disease   . Glaucoma   . Hypercholesterolemia   . Hypertension   . Hypothyroidism   . Inflammatory arthritis   . MI (myocardial infarction) (HCC)   . Neuropathy   . Non-obstructive Coronary artery disease    a. 05/2014 NSTEMI/Cath: LM nl, LAD mild dzs, LCX 60ost hazy (? culprit), RCA mild dzs. EF 60% by echo-->Med Rx; b. 08/2017 Cath: LM 30ost, LAD min irregs, LCX small, 40ost/p, RCA large, min irregs, RPDA/RPL1 nl, EF 50-55%. 4+MR.  . Osteoarthritis   . Permanent atrial fibrillation    a. CHA2DS2VASc = 6-->Xarelto.  . 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 Severe mitral regurgitation      a. 07/2017 Echo: Mod MR; b. 08/2017 Cath: 4+/Severe MR.    Tobacco Use: Social History   Tobacco Use  Smoking Status Never Smoker  Smokeless Tobacco Never Used    Labs: Recent Review Flowsheet Data    Labs for ITP Cardiac and Pulmonary Rehab Latest Ref Rng & Units 03/20/2016 07/29/2016 01/17/2017 05/27/2017 01/27/2018   Cholestrol 0 - 200 mg/dL 230(H) 215(H) 199 159 144   LDLCALC 0 - 99 mg/dL 140(H) 125(H) 110(H) 74 78   LDLDIRECT mg/dL - - - - -   HDL >39.00 mg/dL 67.30 54.20 67.30 64.30 44.10   Trlycerides 0.0 - 149.0 mg/dL 113.0 178.0(H) 107.0 101.0 107.0   Hemoglobin A1c 4.6 - 6.5 % 5.9 6.0 5.8 5.7 5.6    TCO2 0 - 100 mmol/L - - - - -       Pulmonary Assessment Scores: Pulmonary Assessment Scores    Row Name 11/25/17 1158 02/16/18 1359       ADL UCSD   ADL Phase  Entry  Entry    SOB Score total  75  57    Rest  0  0    Walk  3  2    Stairs  5  5    Bath  5  4    Dress  5  5    Shop  5  4      CAT Score   CAT Score  20  22      mMRC Score   mMRC Score  -  0       Pulmonary Function Assessment: Pulmonary Function Assessment - 02/16/18 1425      Breath   Bilateral Breath Sounds  Clear    Shortness of Breath  Limiting activity;No       Exercise Target Goals: Exercise Program Goal: Individual exercise prescription set using results from initial 6 min walk test and THRR while considering  patient's activity barriers and safety.   Exercise Prescription Goal: Initial exercise prescription builds to 30-45 minutes a day of aerobic activity, 2-3 days per week.  Home exercise guidelines will be given to patient during program as part of exercise prescription that the participant will acknowledge.  Activity Barriers & Risk Stratification: Activity Barriers & Cardiac Risk Stratification - 02/16/18 1444      Activity Barriers & Cardiac Risk Stratification   Activity Barriers  Deconditioning;Muscular Weakness;Balance Concerns;History of Falls;Other (comment);Decreased Ventricular Function    Comments  bilateral leg pain when walking       6 Minute Walk: 6 Minute Walk    Row Name 02/16/18 1442         6 Minute Walk   Phase  Initial     Distance  830 feet     Walk Time  4.55 minutes     # of Rest Breaks  1 1:27     MPH  2.07     METS  1.05     RPE  11     Perceived Dyspnea   0     VO2 Peak  3.67     Symptoms  Yes (comment)     Comments  leg fatigued/painful from hips down back of both legs     Resting HR  64 bpm     Resting BP  128/72     Resting Oxygen Saturation   96 %     Exercise Oxygen Saturation  during 6 min walk  93 %     Max Ex.   HR  88 bpm     Max Ex.  BP  132/72     2 Minute Post BP  128/70       Interval HR   1 Minute HR  78     2 Minute HR  84     3 Minute HR  87     4 Minute HR  81     5 Minute HR  75     6 Minute HR  88     2 Minute Post HR  67     Interval Heart Rate?  Yes       Interval Oxygen   Interval Oxygen?  Yes     Baseline Oxygen Saturation %  96 %     1 Minute Oxygen Saturation %  93 %     1 Minute Liters of Oxygen  0 L Room Air     2 Minute Oxygen Saturation %  93 %     2 Minute Liters of Oxygen  0 L     3 Minute Oxygen Saturation %  94 % rest 3:33-5:00     3 Minute Liters of Oxygen  0 L     4 Minute Oxygen Saturation %  94 %     4 Minute Liters of Oxygen  0 L     5 Minute Oxygen Saturation %  94 %     5 Minute Liters of Oxygen  0 L     6 Minute Oxygen Saturation %  96 %     6 Minute Liters of Oxygen  0 L     2 Minute Post Oxygen Saturation %  96 %     2 Minute Post Liters of Oxygen  0 L       Oxygen Initial Assessment: Oxygen Initial Assessment - 02/16/18 1423      Home Oxygen   Home Oxygen Device  None    Sleep Oxygen Prescription  None    Home Exercise Oxygen Prescription  None    Home at Rest Exercise Oxygen Prescription  None      Initial 6 min Walk   Oxygen Used  None      Program Oxygen Prescription   Program Oxygen Prescription  None      Intervention   Short Term Goals  To learn and understand importance of maintaining oxygen saturations>88%;To learn and demonstrate proper pursed lip breathing techniques or other breathing techniques.;To learn and understand importance of monitoring SPO2 with pulse oximeter and demonstrate accurate use of the pulse oximeter.    Long  Term Goals  Verbalizes importance of monitoring SPO2 with pulse oximeter and return demonstration;Maintenance of O2 saturations>88%;Exhibits proper breathing techniques, such as pursed lip breathing or other method taught during program session       Oxygen Re-Evaluation: Oxygen Re-Evaluation    [place] Name 03/09/18 1143              Goals/Expected Outcomes   Comments  Reviewed PLB technique with pt.  Talked about how it work and it's important to maintaining his exercise saturations.       Goals/Expected Outcomes  Short: Become more profiecient at using PLB.   Long: Become independent at using PLB.          Oxygen Discharge (Final Oxygen Re-Evaluation): Oxygen Re-Evaluation - 03/09/18 1143      Goals/Expected Outcomes   Comments  Reviewed PLB technique with pt.  Talked about how it work and it's important  to maintaining his exercise saturations.    Goals/Expected Outcomes  Short: Become more profiecient at using PLB.   Long: Become independent at using PLB.       Initial Exercise Prescription: Initial Exercise Prescription - 02/16/18 1400      Date of Initial Exercise RX and Referring Provider   Date  02/16/18    Referring Provider  Glori Bickers MD      Treadmill   MPH  1.7    Grade  0.5    Minutes  15    METs  2.42      Recumbant Bike   Level  1    RPM  50    Minutes  15    METs  1.5      NuStep   Level  1    SPM  80    Minutes  15    METs  1.5      Prescription Details   Frequency (times per week)  3    Duration  Progress to 45 minutes of aerobic exercise without signs/symptoms of physical distress      Intensity   THRR 40-80% of Max Heartrate  --   92-119   Ratings of Perceived Exertion  11-13    Perceived Dyspnea  0-4      Progression   Progression  Continue to progress workloads to maintain intensity without signs/symptoms of physical distress.      Resistance Training   Training Prescription  Yes    Weight  3 lbs    Reps  10-15       Perform Capillary Blood Glucose checks as needed.  Exercise Prescription Changes: Exercise Prescription Changes    Row Name 02/16/18 1400 03/12/18 1600 03/18/18 1200         Response to Exercise   Blood Pressure (Admit)  128/72  122/60  -     Blood Pressure (Exercise)  132/72  140/60  -     Blood Pressure (Exit)  128/70   122/60  -     Heart Rate (Admit)  64 bpm  77 bpm  -     Heart Rate (Exercise)  88 bpm  80 bpm  -     Heart Rate (Exit)  67 bpm  69 bpm  -     Oxygen Saturation (Admit)  96 %  92 %  -     Oxygen Saturation (Exercise)  93 %  90 %  -     Oxygen Saturation (Exit)  96 %  97 %  -     Rating of Perceived Exertion (Exercise)  11  14  -     Perceived Dyspnea (Exercise)  0  3  -     Symptoms  leg fatigue/pain bilateral back of both legs  -  -     Comments  walk test results  1st day  -     Duration  -  Progress to 45 minutes of aerobic exercise without signs/symptoms of physical distress  -     Intensity  -  THRR unchanged  -       Progression   Progression  -  Continue to progress workloads to maintain intensity without signs/symptoms of physical distress.  -     Average METs  -  2.6  -       Resistance Training   Training Prescription  -  Yes  -     Weight  -  3 lb  -  Reps  -  10-15  -       Treadmill   MPH  -  1.7  -     Grade  -  0.5  -     Minutes  -  15  -     METs  -  2.42  -       Recumbant Bike   Level  -  1  -     RPM  -  50  -     Minutes  -  15  -     METs  -  3.23  -       NuStep   Level  -  1  -     SPM  -  80  -     Minutes  -  15  -     METs  -  2.1  -       Home Exercise Plan   Plans to continue exercise at  -  -  Home (comment) walking and treadmill     Frequency  -  -  Add 1 additional day to program exercise sessions.     Initial Home Exercises Provided  -  -  03/18/18        Exercise Comments: Exercise Comments    Row Name 03/09/18 1142           Exercise Comments  First full day of exercise!  Patient was oriented to gym and equipment including functions, settings, policies, and procedures.  Patient's individual exercise prescription and treatment plan were reviewed.  All starting workloads were established based on the results of the 6 minute walk test done at initial orientation visit.  The plan for exercise progression was also introduced and  progression will be customized based on patient's performance and goals.          Exercise Goals and Review: Exercise Goals    Row Name 02/16/18 1447             Exercise Goals   Increase Physical Activity  Yes       Intervention  Provide advice, education, support and counseling about physical activity/exercise needs.;Develop an individualized exercise prescription for aerobic and resistive training based on initial evaluation findings, risk stratification, comorbidities and participant's personal goals.       Expected Outcomes  Short Term: Attend rehab on a regular basis to increase amount of physical activity.;Long Term: Add in home exercise to make exercise part of routine and to increase amount of physical activity.;Long Term: Exercising regularly at least 3-5 days a week.       Increase Strength and Stamina  Yes       Intervention  Provide advice, education, support and counseling about physical activity/exercise needs.;Develop an individualized exercise prescription for aerobic and resistive training based on initial evaluation findings, risk stratification, comorbidities and participant's personal goals.       Expected Outcomes  Short Term: Increase workloads from initial exercise prescription for resistance, speed, and METs.;Long Term: Improve cardiorespiratory fitness, muscular endurance and strength as measured by increased METs and functional capacity (6MWT);Short Term: Perform resistance training exercises routinely during rehab and add in resistance training at home       Able to understand and use rate of perceived exertion (RPE) scale  Yes       Intervention  Provide education and explanation on how to use RPE scale       Expected Outcomes  Short Term:  Able to use RPE daily in rehab to express subjective intensity level;Long Term:  Able to use RPE to guide intensity level when exercising independently       Able to understand and use Dyspnea scale  Yes       Intervention   Provide education and explanation on how to use Dyspnea scale       Expected Outcomes  Short Term: Able to use Dyspnea scale daily in rehab to express subjective sense of shortness of breath during exertion;Long Term: Able to use Dyspnea scale to guide intensity level when exercising independently       Knowledge and understanding of Target Heart Rate Range (THRR)  Yes       Intervention  Provide education and explanation of THRR including how the numbers were predicted and where they are located for reference       Expected Outcomes  Short Term: Able to state/look up THRR;Short Term: Able to use daily as guideline for intensity in rehab;Long Term: Able to use THRR to govern intensity when exercising independently       Able to check pulse independently  Yes       Intervention  Provide education and demonstration on how to check pulse in carotid and radial arteries.;Review the importance of being able to check your own pulse for safety during independent exercise       Expected Outcomes  Short Term: Able to explain why pulse checking is important during independent exercise;Long Term: Able to check pulse independently and accurately       Understanding of Exercise Prescription  Yes       Intervention  Provide education, explanation, and written materials on patient's individual exercise prescription       Expected Outcomes  Short Term: Able to explain program exercise prescription;Long Term: Able to explain home exercise prescription to exercise independently          Exercise Goals Re-Evaluation : Exercise Goals Re-Evaluation    [place] Name 03/09/18 1143 03/18/18 1228           Exercise Goal Re-Evaluation   Exercise Goals Review  Increase Physical Activity;Increase Strength and Stamina;Able to understand and use rate of perceived exertion (RPE) scale;Knowledge and understanding of Target Heart Rate Range (THRR);Understanding of Exercise Prescription;Able to understand and use Dyspnea scale   Increase Physical Activity;Able to understand and use rate of perceived exertion (RPE) scale;Knowledge and understanding of Target Heart Rate Range (THRR);Understanding of Exercise Prescription;Increase Strength and Stamina;Able to understand and use Dyspnea scale;Able to check pulse independently      Comments  Reviewed RPE scale, THR and program prescription with pt today.  Pt voiced understanding and was given a copy of goals to take home.   Reviewed home exercise with pt today.  Pt plans to walk and use treadmill at home for exercise.  Reviewed THR, pulse, RPE, sign and symptoms, and when to call 911 or MD.  Also discussed weather considerations and indoor options.  Pt voiced understanding.      Expected Outcomes  Short: Use RPE daily to regulate intensity. Long: Follow program prescription in THR.  Short: Start to add in treadmill at least one day a week.  Long: Continue to exercise more at home.          Discharge Exercise Prescription (Final Exercise Prescription Changes): Exercise Prescription Changes - 03/18/18 1200      Home Exercise Plan   Plans to continue exercise at  Home (comment)   walking  and treadmill   Frequency  Add 1 additional day to program exercise sessions.    Initial Home Exercises Provided  03/18/18       Nutrition:  Target Goals: Understanding of nutrition guidelines, daily intake of sodium '1500mg'$ , cholesterol '200mg'$ , calories 30% from fat and 7% or less from saturated fats, daily to have 5 or more servings of fruits and vegetables.  Biometrics: Pre Biometrics - 02/16/18 1447      Pre Biometrics   Height  5' 1.5" (1.562 m)    Weight  132 lb 3.2 oz (60 kg)    Waist Circumference  30 inches    Hip Circumference  39 inches    Waist to Hip Ratio  0.77 %    BMI (Calculated)  24.58    Single Leg Stand  0.54 seconds        Nutrition Therapy Plan and Nutrition Goals: Nutrition Therapy & Goals - 03/09/18 1248      Nutrition Therapy   Diet  DASH    Protein  (specify units)  6oz    Fiber  20 grams    Whole Grain Foods  3 servings   eats rye toast, does not regularly choose whole grains   Saturated Fats  11 max. grams    Fruits and Vegetables  5 servings/day   8 ideal; tries to eat fruits and vegetables at least daily   Sodium  2000 grams   per MD     Personal Nutrition Goals   Nutrition Goal  Drink at least one additional glass of fluids per day. Try keeping a full glass near you during the day as a visual reminder to drink more    Personal Goal #2  Limit Himal[place] pink salt the same way that you limit other forms of salt. Use salt-free seasonings instead    Personal Goal #3  Drink adequate fluids each day in conjunction with taking your fluid pill in order to keep wt from excess fluid from accumulating    Comments  She is hesitent to eat foods that may contain sodium, and very seldom eats out. She does not eat fried foods. She has been using [place] pink salt and was unaware that it effected her in the same way as regular table salt. Breakfast: rye toast, egg. Lunch: "snacks" on carrots and/or fruit. Dinner: various vegetables (mostly greens), beans, meats (not daily) such as chicken, fish, [place] or shrimp, salmon, tuna, white fish. Other snack foods: popcorn, cottage cheese occasionally. Beverages: diet Coke, OJ, water. She cooks using olive oil.      Intervention Plan   Intervention  Prescribe, educate and counsel regarding individualized specific dietary modifications aiming towards targeted core components such as weight, hypertension, lipid management, diabetes, heart failure and other comorbidities.    Expected Outcomes  Short Term Goal: Understand basic principles of dietary content, such as calories, fat, sodium, cholesterol and nutrients.;Short Term Goal: A plan has been developed with personal nutrition goals set during dietitian appointment.;Long Term Goal: Adherence to prescribed nutrition plan.       Nutrition  Assessments: Nutrition Assessments - 02/16/18 1402      MEDFICTS Scores   Pre Score  0       Nutrition Goals Re-Evaluation: Nutrition Goals Re-Evaluation    [place] Name 03/09/18 1257             Goals   Nutrition Goal  Drink at least one additional glass of fluids per day. Try keeping a full glass near you  during the day as a visual reminder to drink more       Comment  She reports wt to have increased from 132# - over 140# within the past couple of weeks d/t being taken off of her fluid pill temporarily. She also feels that she does not drink enough fluids throughout the day.        Expected Outcome  Drink one additional glass of water/fluids per day consistently. Continue to take Torsemide as prescribed to limit excess fluid buildup. Monitor wt daily as instructed         Personal Goal #2 Re-Evaluation   Personal Goal #2  Limit Himalayan pink salt the same way that you limit other forms of salt. Use salt free seasonings instead         Personal Goal #3 Re-Evaluation   Personal Goal #3  Drink adequate fluids each day in conjunction with taking your fluid pill in order to keep wt from excess fluid from accumulating          Nutrition Goals Discharge (Final Nutrition Goals Re-Evaluation): Nutrition Goals Re-Evaluation - 03/09/18 1257      Goals   Nutrition Goal  Drink at least one additional glass of fluids per day. Try keeping a full glass near you during the day as a visual reminder to drink more    Comment  She reports wt to have increased from 132# - over 140# within the past couple of weeks d/t being taken off of her fluid pill temporarily. She also feels that she does not drink enough fluids throughout the day.     Expected Outcome  Drink one additional glass of water/fluids per day consistently. Continue to take Torsemide as prescribed to limit excess fluid buildup. Monitor wt daily as instructed      Personal Goal #2 Re-Evaluation   Personal Goal #2  Limit Himal[place] pink  salt the same way that you limit other forms of salt. Use salt free seasonings instead      Personal Goal #3 Re-Evaluation   Personal Goal #3  Drink adequate fluids each day in conjunction with taking your fluid pill in order to keep wt from excess fluid from accumulating       Psychosocial: Target Goals: Acknowledge presence or absence of significant depression and/or stress, maximize coping skills, provide positive support system. Participant is able to verbalize types and ability to use techniques and skills needed for reducing stress and depression.   Initial Review & Psychosocial Screening: Initial Psych Review & Screening - 02/16/18 1425      Initial Review   Current issues with  None Identified      Family Dynamics   Good Support System?  Yes    Comments  Her daughter in law and her neighbor      Barriers   Psychosocial barriers to participate in program  There are no identifiable barriers or psychosocial needs.;The patient should benefit from training in stress management and relaxation.      Screening Interventions   Interventions  Encouraged to exercise    Expected Outcomes  Short Term goal: Utilizing psychosocial counselor, staff and physician to assist with identification of specific Stressors or current issues interfering with healing process. Setting desired goal for each stressor or current issue identified.;Long Term Goal: Stressors or current issues are controlled or eliminated.;Short Term goal: Identification and review with participant of any Quality of Life or Depression concerns found by scoring the questionnaire.;Long Term goal: The participant improves quality of  Life and PHQ9 Scores as seen by post scores and/or verbalization of changes       Quality of Life Scores:  Scores of 19 and below usually indicate a poorer quality of life in these areas.  A difference of  2-3 points is a clinically meaningful difference.  A difference of 2-3 points in the total score of  the Quality of Life Index has been associated with significant improvement in overall quality of life, self-image, physical symptoms, and general health in studies assessing change in quality of life.  PHQ-9: Recent Review Flowsheet Data    Depression screen [facility] 2/9 02/16/2018 11/25/2017 04/21/2017 07/30/2016 04/18/2016   Decreased Interest 3 1 0 0 0   Down, Depressed, Hopeless 0 0 0 0 0   PHQ - 2 Score 3 1 0 0 0   Altered sleeping 0 2 - 0 -   Tired, decreased energy 3 3 - 0 -   Change in appetite 2 0 - 0 -   Feeling bad or failure about yourself  0 0 - 0 -   Trouble concentrating 0 0 - 0 -   Moving slowly or fidgety/restless 0 0 - 0 -   Suicidal thoughts 0 0 - 0 -   PHQ-9 Score 8 6 - 0 -   Difficult doing work/chores Somewhat difficult Somewhat difficult - - -     Interpretation of Total Score  Total Score Depression Severity:  1-4 = Minimal depression, 5-9 = Mild depression, 10-14 = Moderate depression, 15-19 = Moderately severe depression, 20-27 = Severe depression   Psychosocial Evaluation and Intervention:   Psychosocial Re-Evaluation:   Psychosocial Discharge (Final Psychosocial Re-Evaluation):   Education: Education Goals: Education classes will be provided on a weekly basis, covering required topics. Participant will state understanding/return demonstration of topics presented.  Learning Barriers/Preferences: Learning Barriers/Preferences - 02/16/18 1403      Learning Barriers/Preferences   Learning Barriers  None    Learning Preferences  Video       Education Topics:  Initial Evaluation Education: - Verbal, written and demonstration of respiratory meds, oximetry and breathing techniques. Instruction on use of nebulizers and MDIs and importance of monitoring MDI activations.   Pulmonary Rehab from 03/20/2018 in [facility] Cardiac and Pulmonary Rehab  Date  02/16/18  Educator  [facility]  Instruction Review Code  1- Verbalizes Understanding      General Nutrition  Guidelines/Fats and Fiber: -Group instruction provided by verbal, written material, models and posters to present the general guidelines for heart healthy nutrition. Gives an explanation and review of dietary fats and fiber.   Cardiac Rehab from 09/07/2014 in [facility] Cardiac and Pulmonary Rehab  Date  08/29/14  Educator  C. Joneen Caraway, [place]  Instruction Review Code (retired)  2- meets goals/outcomes      Controlling Sodium/Reading Food Labels: -Group verbal and written material supporting the discussion of sodium use in heart healthy nutrition. Review and explanation with models, verbal and written materials for utilization of the food label.   Cardiac Rehab from 09/07/2014 in [facility] Cardiac and Pulmonary Rehab  Date  09/05/14  Educator  CR  Instruction Review Code (retired)  2- meets goals/outcomes      Exercise Physiology & General Exercise Guidelines: - Group verbal and written instruction with models to review the exercise physiology of the cardiovascular system and associated critical values. Provides general exercise guidelines with specific guidelines to those with heart or lung disease.    Cardiac Rehab from 09/07/2014 in [facility] Cardiac and  Pulmonary Rehab  Date  07/27/14  Educator  Cruz Devilla Art Macmillan MS CEP  Instruction Review Code (retired)  2- meets goals/outcomes      Aerobic Exercise & Resistance Training: - Gives group verbal and written instruction on the various components of exercise. Focuses on aerobic and resistive training programs and the benefits of this training and how to safely progress through these programs.   Cardiac Rehab from 09/07/2014 in [facility] Cardiac and Pulmonary Rehab  Date  08/01/14  Educator  RM  Instruction Review Code (retired)  2- Statistician, Balance, Mind/Body Relaxation: Provides group verbal/written instruction on the benefits of flexibility and balance training, including mind/body exercise modes such as yoga, pilates and tai  chi.  Demonstration and skill practice provided.   Cardiac Rehab from 09/07/2014 in [facility] Cardiac and Pulmonary Rehab  Date  08/03/14  Educator  RM  Instruction Review Code (retired)  2- meets goals/outcomes      Stress and Anxiety: - Provides group verbal and written instruction about the health risks of elevated stress and causes of high stress.  Discuss the correlation between heart/lung disease and anxiety and treatment options. Review healthy ways to manage with stress and anxiety.   Cardiac Rehab from 09/07/2014 in [facility] Cardiac and Pulmonary Rehab  Date  07/20/14  Educator  Lucianne Lei, LCSW  Instruction Review Code (retired)  2- meets goals/outcomes      Depression: - Provides group verbal and written instruction on the correlation between heart/lung disease and depressed mood, treatment options, and the stigmas associated with seeking treatment.   Cardiac Rehab from 09/07/2014 in [facility] Cardiac and Pulmonary Rehab  Date  09/07/14  Educator  [facility]  Instruction Review Code (retired)  2- meets goals/outcomes      Exercise & Equipment Safety: - Individual verbal instruction and demonstration of equipment use and safety with use of the equipment.   Pulmonary Rehab from 03/20/2018 in [facility] Cardiac and Pulmonary Rehab  Date  02/16/18  Educator  [facility]  Instruction Review Code  1- Verbalizes Understanding      Infection Prevention: - Provides verbal and written material to individual with discussion of infection control including proper hand washing and proper equipment cleaning during exercise session.   Pulmonary Rehab from 03/20/2018 in [facility] Cardiac and Pulmonary Rehab  Date  02/16/18  Educator   [facility]  Instruction Review Code  1- Verbalizes Understanding      Falls Prevention: - Provides verbal and written material to individual with discussion of falls prevention and safety.   Pulmonary Rehab from 03/20/2018 in [facility] Cardiac and Pulmonary Rehab  Date  02/16/18  Educator  [facility]   Instruction Review Code  1- Verbalizes Understanding      Diabetes: - Individual verbal and written instruction to review signs/symptoms of diabetes, desired ranges of glucose level fasting, after meals and with exercise. Advice that pre and post exercise glucose checks will be done for 3 sessions at entry of program.   Chronic Lung Diseases: - Group verbal and written instruction to review updates, respiratory medications, advancements in procedures and treatments. Discuss use of supplemental oxygen including available portable oxygen systems, continuous and intermittent flow rates, concentrators, personal use and safety guidelines. Review proper use of inhaler and spacers. Provide informative websites for self-education.    Energy Conservation: - Provide group verbal and written instruction for methods to conserve energy, plan and organize activities. Instruct on pacing techniques, use of adaptive equipment and posture/positioning to relieve shortness of  breath.   Triggers and Exacerbations: - Group verbal and written instruction to review types of environmental triggers and ways to prevent exacerbations. Discuss weather changes, air quality and the benefits of nasal washing. Review warning signs and symptoms to help prevent infections. Discuss techniques for effective airway clearance, coughing, and vibrations.   AED/CPR: - Group verbal and written instruction with the use of models to demonstrate the basic use of the AED with the basic ABC's of resuscitation.   Anatomy and Physiology of the Lungs: - Group verbal and written instruction with the use of models to provide basic lung anatomy and physiology related to function, structure and complications of lung disease.   Anatomy & Physiology of the Heart: - Group verbal and written instruction and models provide basic cardiac anatomy and physiology, with the coronary electrical and arterial systems. Review of Valvular disease and  Heart Failure   Pulmonary Rehab from 03/20/2018 in [facility] Cardiac and Pulmonary Rehab  Date  03/13/18  Educator  [facility]  Instruction Review Code  1- Verbalizes Understanding      Cardiac Medications: - Group verbal and written instruction to review commonly prescribed medications for heart disease. Reviews the medication, class of the drug, and side effects.   Pulmonary Rehab from 03/20/2018 in [facility] Cardiac and Pulmonary Rehab  Date  03/20/18  Educator  [facility]  Instruction Review Code  1- Verbalizes Understanding      Know Your Numbers and Risk Factors: -Group verbal and written instruction about important numbers in your health.  Discussion of what are risk factors and how they play a role in the disease process.  Review of Cholesterol, Blood Pressure, Diabetes, and BMI and the role they play in your overall health.   Sleep Hygiene: -Provides group verbal and written instruction about how sleep can affect your health.  Define sleep hygiene, discuss sleep cycles and impact of sleep habits. Review good sleep hygiene tips.    Pulmonary Rehab from 03/20/2018 in [facility] Cardiac and Pulmonary Rehab  Date  03/18/18  Educator  [facility]  Instruction Review Code  1- Verbalizes Understanding      Other: -Provides group and verbal instruction on various topics (see comments)    Knowledge Questionnaire Score: Knowledge Questionnaire Score - 02/16/18 1401      Knowledge Questionnaire Score   Pre Score  13/18   Reviewed with patient       Core Components/Risk Factors/Patient Goals at Admission: Personal Goals and Risk Factors at Admission - 02/16/18 1427      Core Components/Risk Factors/Patient Goals on Admission    Weight Management  Yes;Weight Loss    Intervention  Weight Management: Develop a combined nutrition and exercise program designed to reach desired caloric intake, while maintaining appropriate intake of nutrient and fiber, sodium and fats, and appropriate energy expenditure required for the  weight goal.;Weight Management: Provide education and appropriate resources to help participant work on and attain dietary goals.    Admit Weight  132 lb (59.9 kg)    Goal Weight: Short Term  127 lb (57.6 kg)    Goal Weight: Long Term  120 lb (54.4 kg)    Expected Outcomes  Short Term: Continue to assess and modify interventions until short term weight is achieved;Long Term: Adherence to nutrition and physical activity/exercise program aimed toward attainment of established weight goal;Weight Loss: Understanding of general recommendations for a balanced deficit meal plan, which promotes 1-2 lb weight loss per week and includes a negative energy balance of [phone] kcal/d;Understanding  recommendations for meals to include 15-35% energy as protein, 25-35% energy from fat, 35-60% energy from carbohydrates, less than '200mg'$  of dietary cholesterol, 20-35 gm of total fiber daily;Understanding of distribution of calorie intake throughout the day with the consumption of 4-5 meals/snacks    Improve shortness of breath with ADL's  Yes    Intervention  Provide education, individualized exercise plan and daily activity instruction to help decrease symptoms of SOB with activities of daily living.    Expected Outcomes  Short Term: Improve cardiorespiratory fitness to achieve a reduction of symptoms when performing ADLs;Long Term: Be able to perform more ADLs without symptoms or delay the onset of symptoms    Hypertension  Yes    Intervention  Provide education on lifestyle modifcations including regular physical activity/exercise, weight management, moderate sodium restriction and increased consumption of fresh fruit, vegetables, and low fat dairy, alcohol moderation, and smoking cessation.;Monitor prescription use compliance.    Expected Outcomes  Short Term: Continued assessment and intervention until BP is < 140/21m HG in hypertensive participants. < 130/891mHG in hypertensive participants with diabetes, heart  failure or chronic kidney disease.;Long Term: Maintenance of blood pressure at goal levels.       Core Components/Risk Factors/Patient Goals Review:    Core Components/Risk Factors/Patient Goals at Discharge (Final Review):    ITP Comments: ITP Comments    Row Name 11/25/17 1147 02/16/18 1359 02/23/18 0858 03/23/18 0854     ITP Comments  Medical Evaluation completed. Chart sent for review and changes to Dr. MaEmily Filbertirector of LuBunkieDiagnosis can be [facility]  Medical Evaluation completed. Chart sent for review and changes to Dr. MaEmily Filbertirector of LuAlabasterDiagnosis can be found in [facility] encounter 08/21/17  30 day review completed. ITP sent to Dr. MaEmily Filbertirector of LuFort AshbyContinue with ITP unless changes are made by physician.  30 day review completed. ITP sent to Dr. MaEmily Filbertirector of LuTerltonContinue with ITP unless changes are made by physician.       Comments: 30 day review

## 2018-03-25 ENCOUNTER — Ambulatory Visit: Payer: Medicare Other

## 2018-03-25 ENCOUNTER — Other Ambulatory Visit: Payer: Self-pay

## 2018-03-25 ENCOUNTER — Encounter: Payer: Medicare Other | Admitting: *Deleted

## 2018-03-25 DIAGNOSIS — I5032 Chronic diastolic (congestive) heart failure: Secondary | ICD-10-CM | POA: Diagnosis not present

## 2018-03-25 MED ORDER — RIVAROXABAN 15 MG PO TABS: 15.0000 mg | ORAL_TABLET | Freq: Every day | ORAL | 6 refills | Status: DC

## 2018-03-25 NOTE — Progress Notes (Signed)
Daily Session Note  Patient Details  Name: Ruth Roth MRN: [id number] Date of Birth: [birth year] Referring Provider:     Pulmonary Rehab from 02/16/2018 in [facility] Cardiac and Pulmonary Rehab  Referring Provider  Glori Bickers MD      Encounter Date: 03/25/2018  Check In: Session Check In - 03/25/18 1114      Check-In   Supervising physician immediately available to respond to emergencies  [name] immediately available ER MD    Physician(s)  Drs. Joni Fears and [place]    Location  [facility]-Cardiac & Pulmonary Rehab    Staff Present  Alberteen Sam, MA, RCEP, CCRP, Exercise Physiologist;Joseph [employer], [place], ACSM CEP, Exercise Physiologist    Medication changes reported      No    Fall or balance concerns reported     No    Warm-up and Cool-down  Performed as group-led instruction    Resistance Training Performed  Yes    VAD Patient?  No    PAD/SET Patient?  No      Pain Assessment   Currently in Pain?  No/denies          Social History   Tobacco Use  Smoking Status Never Smoker  Smokeless Tobacco Never Used    Goals Met:  Proper associated with RPD/PD & O2 Sat Independence with exercise equipment Using PLB without cueing & demonstrates good technique Exercise tolerated well No report of cardiac concerns or symptoms Strength training completed today  Goals Unmet:  Not Applicable  Comments: Pt able to follow exercise prescription today without complaint.  Will continue to monitor for progression.    Dr. Emily Filbert is Medical Director for [place] and [name].

## 2018-03-27 ENCOUNTER — Ambulatory Visit: Payer: Medicare Other

## 2018-03-27 ENCOUNTER — Other Ambulatory Visit: Payer: Self-pay | Admitting: Pharmacist

## 2018-03-27 ENCOUNTER — Encounter: Payer: Medicare Other | Admitting: *Deleted

## 2018-03-27 DIAGNOSIS — I5032 Chronic diastolic (congestive) heart failure: Secondary | ICD-10-CM

## 2018-03-27 MED ORDER — RIVAROXABAN 15 MG PO TABS: 15.0000 mg | ORAL_TABLET | Freq: Every day | ORAL | 6 refills | Status: DC

## 2018-03-27 NOTE — Progress Notes (Signed)
Daily Session Note  Patient Details  Name: Ruth Roth MRN: [id number] Date of Birth: January 22, 1929 Referring Provider:     Pulmonary Rehab from 02/16/2018 in [facility] Cardiac and Pulmonary Rehab  Referring Provider  Glori Bickers MD      Encounter Date: 03/27/2018  Check In: Session Check In - 03/27/18 1140      Check-In   Supervising physician immediately available to respond to emergencies  [name] immediately available ER MD    Physician(s)  Dr. Corky Downs and Clearnce Hasten    Location  [facility]-Cardiac & Pulmonary Rehab    Staff Present  Justin Mend Lorre Nick, [place], RCEP, CCRP, Exercise Physiologist;Kamyiah Colantonio Sherryll Burger, RN BSN    Medication changes reported      No    Fall or balance concerns reported     No    Warm-up and Cool-down  Performed as group-led Higher education careers adviser Performed  Yes    VAD Patient?  No    PAD/SET Patient?  No      Pain Assessment   Currently in Pain?  No/denies          Social History   Tobacco Use  Smoking Status Never Smoker  Smokeless Tobacco Never Used    Goals Met:  Proper associated with RPD/PD & O2 Sat Independence with exercise equipment Exercise tolerated well No report of cardiac concerns or symptoms Strength training completed today  Goals Unmet:  Not Applicable  Comments: Pt able to follow exercise prescription today without complaint.  Will continue to monitor for progression.    Dr. Emily Filbert is Medical Director for [name] and [name].

## 2018-03-30 ENCOUNTER — Ambulatory Visit: Payer: Medicare Other

## 2018-03-30 ENCOUNTER — Inpatient Hospital Stay: Payer: Medicare Other | Attending: Oncology

## 2018-03-30 VITALS — BP 130/69 | HR 64 | Resp 20

## 2018-03-30 DIAGNOSIS — I5032 Chronic diastolic (congestive) heart failure: Secondary | ICD-10-CM

## 2018-03-30 DIAGNOSIS — D509 Iron deficiency anemia, unspecified: Secondary | ICD-10-CM | POA: Insufficient documentation

## 2018-03-30 MED ORDER — SODIUM CHLORIDE 0.9 % IV SOLN
510.0000 mg | Freq: Once | INTRAVENOUS | Status: AC
  Administered 2018-03-30: 510 mg via INTRAVENOUS
  Filled 2018-03-30: qty 17

## 2018-03-30 MED ORDER — SODIUM CHLORIDE 0.9 % IV SOLN
Freq: Once | INTRAVENOUS | Status: AC
  Administered 2018-03-30: 14:00:00 via INTRAVENOUS
  Filled 2018-03-30: qty 250

## 2018-03-30 NOTE — Progress Notes (Signed)
Daily Session Note  Patient Details  Name: Ruth Roth MRN: [id number] Date of Birth: 10/23/1928 Referring Provider:     Pulmonary Rehab from 02/16/2018 in [facility] Cardiac and Pulmonary Rehab  Referring Provider  Bensimhon, Daniel MD      Encounter Date: 03/30/2018  Check In:      Social History   Tobacco Use  Smoking Status Never Smoker  Smokeless Tobacco Never Used    Goals Met:  Proper associated with RPD/PD & O2 Sat Independence with exercise equipment Exercise tolerated well Strength training completed today  Goals Unmet:  Not Applicable  Comments: Pt able to follow exercise prescription today without complaint.  Will continue to monitor for progression.    Dr. Mark Miller is Medical Director for [facility] and [name]. 

## 2018-04-01 ENCOUNTER — Ambulatory Visit: Payer: Medicare Other

## 2018-04-01 DIAGNOSIS — I5032 Chronic diastolic (congestive) heart failure: Secondary | ICD-10-CM | POA: Diagnosis not present

## 2018-04-01 NOTE — Progress Notes (Signed)
Daily Session Note  Patient Details  Name: Ruth Roth MRN: [id number] Date of Birth: [birth year] Referring Provider:     Pulmonary Rehab from 02/16/2018 in [facility] Cardiac and Pulmonary Rehab  Referring Provider  Glori Bickers MD      Encounter Date: 04/01/2018  Check In: Session Check In - 04/01/18 1136      Check-In   Supervising physician immediately available to respond to emergencies  [name] immediately available ER MD    Physician(s)  Dr. Mariea Clonts and Mariea Clonts    Location  [facility]-Cardiac & Pulmonary Rehab    Staff Present  Justin Mend RCP,RRT,BSRT;Meredith Sherryll Burger, RN BSN;Jessica Luan Pulling, MA, RCEP, CCRP, Exercise Physiologist    Medication changes reported      No    Fall or balance concerns reported     No    Warm-up and Cool-down  Performed as group-led instruction    Resistance Training Performed  Yes    VAD Patient?  No      Pain Assessment   Currently in Pain?  No/denies          Social History   Tobacco Use  Smoking Status Never Smoker  Smokeless Tobacco Never Used    Goals Met:  Independence with exercise equipment Exercise tolerated well No report of cardiac concerns or symptoms Strength training completed today  Goals Unmet:  Not Applicable  Comments: Pt able to follow exercise prescription today without complaint.  Will continue to monitor for progression.    Dr. Emily Filbert is Medical Director for [place] and [name].

## 2018-04-03 ENCOUNTER — Ambulatory Visit: Payer: Medicare Other

## 2018-04-03 DIAGNOSIS — I5032 Chronic diastolic (congestive) heart failure: Secondary | ICD-10-CM

## 2018-04-03 NOTE — Progress Notes (Signed)
Daily Session Note  Patient Details  Name: Ruth Roth MRN: [id number] Date of Birth: March 21, 1928 Referring Provider:     Pulmonary Rehab from 02/16/2018 in [facility] Cardiac and Pulmonary Rehab  Referring Provider  Glori Bickers MD      Encounter Date: 04/03/2018  Check In: Session Check In - 04/03/18 1127      Check-In   Supervising physician immediately available to respond to emergencies  [name] immediately available ER MD    Physician(s)  Dr. Mariea Clonts and Corky Downs    Location  [facility]-Cardiac & Pulmonary Rehab    Staff Present  Alberteen Sam, MA, RCEP, CCRP, Exercise Physiologist;Darryl Blumenstein Alcus Dad, RN BSN    Medication changes reported      No    Fall or balance concerns reported     No    Warm-up and Cool-down  Performed as group-led Higher education careers adviser Performed  Yes    VAD Patient?  No    PAD/SET Patient?  No      Pain Assessment   Currently in Pain?  No/denies          Social History   Tobacco Use  Smoking Status Never Smoker  Smokeless Tobacco Never Used    Goals Met:  Independence with exercise equipment Exercise tolerated well No report of cardiac concerns or symptoms Strength training completed today  Goals Unmet:  Not Applicable  Comments: Pt able to follow exercise prescription today without complaint.  Will continue to monitor for progression.    Dr. Emily Filbert is Medical Director for [place] and [name].

## 2018-04-06 ENCOUNTER — Inpatient Hospital Stay: Payer: Medicare Other

## 2018-04-06 ENCOUNTER — Ambulatory Visit: Payer: Medicare Other

## 2018-04-06 VITALS — BP 152/74 | HR 74 | Resp 20

## 2018-04-06 DIAGNOSIS — I5032 Chronic diastolic (congestive) heart failure: Secondary | ICD-10-CM

## 2018-04-06 DIAGNOSIS — D509 Iron deficiency anemia, unspecified: Secondary | ICD-10-CM

## 2018-04-06 MED ORDER — SODIUM CHLORIDE 0.9 % IV SOLN
510.0000 mg | Freq: Once | INTRAVENOUS | Status: AC
  Administered 2018-04-06: 510 mg via INTRAVENOUS
  Filled 2018-04-06: qty 17

## 2018-04-06 MED ORDER — SODIUM CHLORIDE 0.9 % IV SOLN
Freq: Once | INTRAVENOUS | Status: AC
  Administered 2018-04-06: 14:00:00 via INTRAVENOUS
  Filled 2018-04-06: qty 250

## 2018-04-06 NOTE — Progress Notes (Signed)
Daily Session Note  Patient Details  Name: Ruth Roth MRN: [id number] Date of Birth: Mar 06, 1929 Referring Provider:     Pulmonary Rehab from 02/16/2018 in [facility] Cardiac and Pulmonary Rehab  Referring Provider  Glori Bickers MD      Encounter Date: 04/06/2018  Check In: Session Check In - 04/06/18 1338      Check-In   Supervising physician immediately available to respond to emergencies  [name] immediately available ER MD    Physician(s)  Dr. Jodell Cipro and Quentin Cornwall    Location  [facility]-Cardiac & Pulmonary Rehab    Staff Present  Earlean Shawl, BS, ACSM CEP, Exercise Physiologist;Alejandra Barna Foy [place], [place], ACSM CEP, Exercise Physiologist    Medication changes reported      No    Fall or balance concerns reported     No    Warm-up and Cool-down  Performed as group-led instruction    Resistance Training Performed  Yes    VAD Patient?  No    PAD/SET Patient?  No      Pain Assessment   Currently in Pain?  No/denies          Social History   Tobacco Use  Smoking Status Never Smoker  Smokeless Tobacco Never Used    Goals Met:  Independence with exercise equipment Exercise tolerated well No report of cardiac concerns or symptoms Strength training completed today  Goals Unmet:  Not Applicable  Comments: Pt able to follow exercise prescription today without complaint.  Will continue to monitor for progression.    Dr. Emily Filbert is Medical Director for [place] and [name].

## 2018-04-08 ENCOUNTER — Encounter: Payer: Medicare Other | Admitting: *Deleted

## 2018-04-08 ENCOUNTER — Ambulatory Visit: Payer: Medicare Other

## 2018-04-08 DIAGNOSIS — I5032 Chronic diastolic (congestive) heart failure: Secondary | ICD-10-CM

## 2018-04-08 NOTE — Progress Notes (Signed)
Daily Session Note  Patient Details  Name: Ruth Roth MRN: [id number] Date of Birth: [birth year] Referring Provider:     Pulmonary Rehab from 02/16/2018 in [facility] Cardiac and Pulmonary Rehab  Referring Provider  Glori Bickers MD      Encounter Date: 04/08/2018  Check In: Session Check In - 04/08/18 1120      Check-In   Supervising physician immediately available to respond to emergencies  [name] immediately available ER MD    Physician(s)  Drs. Domenic Polite    Location  [facility]-Cardiac & Pulmonary Rehab    Staff Present  Renita Papa, RN BSN;Klint Lezcano Luan Pulling, [place], RCEP, CCRP, Exercise Physiologist;Joseph Tessie Fass RCP,RRT,BSRT    Medication changes reported      No    Fall or balance concerns reported     No    Warm-up and Cool-down  Performed as group-led instruction    Resistance Training Performed  Yes    VAD Patient?  No    PAD/SET Patient?  No      Pain Assessment   Currently in Pain?  No/denies          Social History   Tobacco Use  Smoking Status Never Smoker  Smokeless Tobacco Never Used    Goals Met:  Proper associated with RPD/PD & O2 Sat Independence with exercise equipment Using PLB without cueing & demonstrates good technique Exercise tolerated well No report of cardiac concerns or symptoms Strength training completed today  Goals Unmet:  Not Applicable  Comments: Pt able to follow exercise prescription today without complaint.  Will continue to monitor for progression.    Dr. Emily Filbert is Medical Director for [place] and [name].

## 2018-04-10 ENCOUNTER — Ambulatory Visit: Payer: Medicare Other

## 2018-04-10 DIAGNOSIS — I5032 Chronic diastolic (congestive) heart failure: Secondary | ICD-10-CM

## 2018-04-10 NOTE — Progress Notes (Signed)
Daily Session Note  Patient Details  Name: Ruth Roth MRN: [id number] Date of Birth: 24-Aug-1928 Referring Provider:     Pulmonary Rehab from 02/16/2018 in [facility] Cardiac and Pulmonary Rehab  Referring Provider  Glori Bickers MD      Encounter Date: 04/10/2018  Check In:      Social History   Tobacco Use  Smoking Status Never Smoker  Smokeless Tobacco Never Used    Goals Met:  Proper associated with RPD/PD & O2 Sat Independence with exercise equipment Exercise tolerated well Personal goals reviewed Strength training completed today  Goals Unmet:  Not Applicable  Comments: Pt able to follow exercise prescription today without complaint.  Will continue to monitor for progression.   Dr. Emily Filbert is Medical Director for [place] and [name].

## 2018-04-13 ENCOUNTER — Ambulatory Visit: Payer: Medicare Other

## 2018-04-13 ENCOUNTER — Encounter: Payer: Medicare Other | Attending: Internal Medicine

## 2018-04-13 DIAGNOSIS — I5032 Chronic diastolic (congestive) heart failure: Secondary | ICD-10-CM | POA: Insufficient documentation

## 2018-04-13 NOTE — Progress Notes (Signed)
Daily Session Note  Patient Details  Name: Ruth Roth MRN: [id number] Date of Birth: May 24, 1928 Referring Provider:     Pulmonary Rehab from 02/16/2018 in [facility] Cardiac and Pulmonary Rehab  Referring Provider  Glori Bickers MD      Encounter Date: 04/13/2018  Check In: Session Check In - 04/13/18 1147      Check-In   Supervising physician immediately available to respond to emergencies  [name] immediately available ER MD          Social History   Tobacco Use  Smoking Status Never Smoker  Smokeless Tobacco Never Used    Goals Met:  Proper associated with RPD/PD & O2 Sat Independence with exercise equipment Changing diet to healthy choices, watching portion sizes Strength training completed today  Goals Unmet:  Not Applicable  Comments: Pt able to follow exercise prescription today without complaint.  Will continue to monitor for progression.    Dr. Emily Filbert is Medical Director for [place] and [name].

## 2018-04-15 ENCOUNTER — Ambulatory Visit: Payer: Medicare Other

## 2018-04-15 ENCOUNTER — Encounter: Payer: Medicare Other | Admitting: *Deleted

## 2018-04-15 DIAGNOSIS — I5032 Chronic diastolic (congestive) heart failure: Secondary | ICD-10-CM | POA: Diagnosis not present

## 2018-04-15 NOTE — Progress Notes (Signed)
Daily Session Note  Patient Details  Name: Ruth Roth MRN: [id number] Date of Birth: [birth year] Referring Provider:     Pulmonary Rehab from 02/16/2018 in [facility] Cardiac and Pulmonary Rehab  Referring Provider  Glori Bickers MD      Encounter Date: 04/15/2018  Check In: Session Check In - 04/15/18 1150      Check-In   Supervising physician immediately available to respond to emergencies  [name] immediately available ER MD    Physician(s)  Dr. Jimmye Norman and Alfred Levins    Location  [facility]-Cardiac & Pulmonary Rehab    Staff Present  Renita Papa, RN BSN;Jessica Luan Pulling, MA, RCEP, CCRP, Exercise Physiologist;Joseph Tessie Fass RCP,RRT,BSRT    Medication changes reported      No    Fall or balance concerns reported     No    Warm-up and Cool-down  Performed as group-led instruction    Resistance Training Performed  Yes    VAD Patient?  No    PAD/SET Patient?  No      Pain Assessment   Currently in Pain?  No/denies          Social History   Tobacco Use  Smoking Status Never Smoker  Smokeless Tobacco Never Used    Goals Met:  Proper associated with RPD/PD & O2 Sat Independence with exercise equipment Exercise tolerated well No report of cardiac concerns or symptoms Strength training completed today  Goals Unmet:  Not Applicable  Comments: Pt able to follow exercise prescription today without complaint.  Will continue to monitor for progression.    Dr. Emily Filbert is Medical Director for [place] and [name].

## 2018-04-17 ENCOUNTER — Ambulatory Visit: Payer: Medicare Other

## 2018-04-17 ENCOUNTER — Encounter: Payer: Medicare Other | Admitting: *Deleted

## 2018-04-17 DIAGNOSIS — I5032 Chronic diastolic (congestive) heart failure: Secondary | ICD-10-CM | POA: Diagnosis not present

## 2018-04-17 NOTE — Progress Notes (Signed)
Daily Session Note  Patient Details  Name: Ruth Roth MRN: [id number] Date of Birth: 08-17-1928 Referring Provider:     Pulmonary Rehab from 02/16/2018 in [facility] Cardiac and Pulmonary Rehab  Referring Provider  Glori Bickers MD      Encounter Date: 04/17/2018  Check In: Session Check In - 04/17/18 1128      Check-In   Supervising physician immediately available to respond to emergencies  [name] immediately available ER MD    Physician(s)  Drs. Filiberto Pinks    Location  [facility]-Cardiac & Pulmonary Rehab    Staff Present  Renita Papa, RN BSN;Jessica Luan Pulling, [place], RCEP, CCRP, Exercise Physiologist;Joseph Tessie Fass RCP,RRT,BSRT    Medication changes reported      No    Fall or balance concerns reported     No    Warm-up and Cool-down  Performed as group-led instruction    Resistance Training Performed  Yes    VAD Patient?  No    PAD/SET Patient?  No      Pain Assessment   Currently in Pain?  No/denies          Social History   Tobacco Use  Smoking Status Never Smoker  Smokeless Tobacco Never Used    Goals Met:  Proper associated with RPD/PD & O2 Sat Independence with exercise equipment Using PLB without cueing & demonstrates good technique Exercise tolerated well No report of cardiac concerns or symptoms Strength training completed today  Goals Unmet:  Not Applicable  Comments: Pt able to follow exercise prescription today without complaint.  Will continue to monitor for progression.    Dr. Emily Filbert is Medical Director for [place] and [name].

## 2018-04-20 ENCOUNTER — Ambulatory Visit: Payer: Medicare Other

## 2018-04-20 DIAGNOSIS — I5032 Chronic diastolic (congestive) heart failure: Secondary | ICD-10-CM | POA: Diagnosis not present

## 2018-04-20 NOTE — Progress Notes (Signed)
Daily Session Note  Patient Details  Name: Ruth Roth MRN: [id number] Date of Birth: Apr 29, 1928 Referring Provider:     Pulmonary Rehab from 02/16/2018 in [facility] Cardiac and Pulmonary Rehab  Referring Provider  Glori Bickers MD      Encounter Date: 04/20/2018  Check In:      Social History   Tobacco Use  Smoking Status Never Smoker  Smokeless Tobacco Never Used    Goals Met:  Proper associated with RPD/PD & O2 Sat Independence with exercise equipment Exercise tolerated well Strength training completed today  Goals Unmet:  Not Applicable  Comments: Pt able to follow exercise prescription today without complaint.  Will continue to monitor for progression.    Dr. Emily Filbert is Medical Director for [place] and [name].

## 2018-04-20 NOTE — Progress Notes (Signed)
Pulmonary Individual Treatment Plan  Patient Details  Name: Ruth Roth MRN: [id number] Date of Birth: 02/12/1929 Referring Provider:     Pulmonary Rehab from 02/16/2018 in [facility]  Referring Provider  Bensimhon, Daniel MD      Initial Encounter Date:    Pulmonary Rehab from 02/16/2018 in [facility]  Date  02/16/18      Visit Diagnosis: Chronic diastolic heart failure (HCC)  Patient's Home Medications on Admission:  Current Outpatient Medications:  .  brimonidine-timolol (COMBIGAN) 0.2-0.5 % ophthalmic solution, Place 1 drop into the left eye 2 (two) times daily. , Disp: , Rfl:  .  dorzolamide (TRUSOPT) 2 % ophthalmic solution, Place 1 drop into the left eye 2 (two) times daily., Disp: , Rfl:  .  fluticasone (FLONASE) 50 MCG/ACT nasal spray, Place 2 sprays into both nostrils daily as needed for allergies. , Disp: , Rfl:  .  gabapentin (NEURONTIN) 300 MG capsule, TAKE 2 CAPSULES BY MOUTH FOUR TIMES DAILY, Disp: 720 capsule, Rfl: 0 .  ipratropium (ATROVENT) 0.06 % nasal spray, Place 2 sprays into both nostrils daily as needed for rhinitis. , Disp: , Rfl:  .  latanoprost (XALATAN) 0.005 % ophthalmic solution, Place 1 drop into both eyes at bedtime. , Disp: , Rfl:  .  levothyroxine (SYNTHROID, LEVOTHROID) 125 MCG tablet, TAKE 1 TABLET(125 MCG) BY MOUTH DAILY BEFORE BREAKFAST, Disp: 90 tablet, Rfl: 0 .  metoprolol tartrate (LOPRESSOR) 50 MG tablet, TAKE 1 TABLET(50 MG) BY MOUTH TWICE DAILY, Disp: 180 tablet, Rfl: 3 .  pantoprazole (PROTONIX) 40 MG tablet, Take 1 tablet (40 mg total) by mouth every evening., Disp: 90 tablet, Rfl: 3 .  ranitidine (ZANTAC) 150 MG tablet, TAKE 1 TABLET(150 MG) BY MOUTH DAILY 30 MINUTES BEFORE BREAKFAST, Disp: 30 tablet, Rfl: 0 .  Rivaroxaban (XARELTO) 15 MG TABS tablet, Take 1 tablet (15 mg total) by mouth daily with supper., Disp: 30 tablet, Rfl: 6 .  torsemide (DEMADEX) 20 MG tablet, Take 1 tablet (20 mg total)  by mouth daily., Disp: 30 tablet, Rfl: 3 .  traMADol (ULTRAM) 50 MG tablet, Take 1 tablet (50 mg total) by mouth daily as needed (for pain.)., Disp: 30 tablet, Rfl: 1 .  ursodiol (ACTIGALL) 300 MG capsule, Take 300-600 mg by mouth See admin instructions. Take 2 capsules (600 mg) daily after lunch & 1 capsule (300 mg) at night., Disp: , Rfl:   Past Medical History: Past Medical History:  Diagnosis Date  . (HFpEF) heart failure with preserved ejection fraction (HCC)    a. 07/2017 Echo: EF 50-55%, no rwma, mild AS/AI, mod MR, mod dil LA, mod TR, PASP 65mmHg.  . Autoimmune hepatitis (HCC)    followed by Dr Skulskie  . Fibrocystic breast disease   . Glaucoma   . Hypercholesterolemia   . Hypertension   . Hypothyroidism   . Inflammatory arthritis   . MI (myocardial infarction) (HCC)   . Neuropathy   . Non-obstructive Coronary artery disease    a. 05/2014 NSTEMI/Cath: LM nl, LAD mild dzs, LCX 60ost hazy (? culprit), RCA mild dzs. EF 60% by echo-->Med Rx; b. 08/2017 Cath: LM 30ost, LAD min irregs, LCX small, 40ost/p, RCA large, min irregs, RPDA/RPL1 nl, EF 50-55%. 4+MR.  . Osteoarthritis   . Permanent atrial fibrillation    a. CHA2DS2VASc = 6-->Xarelto.  . 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 Severe mitral regurgitation      a. 07/2017 Echo: Mod MR; b. 08/2017 Cath: 4+/Severe MR.    Tobacco Use: Social History   Tobacco Use  Smoking Status Never Smoker  Smokeless Tobacco Never Used    Labs: Recent Review Flowsheet Data    Labs for ITP Cardiac and Pulmonary Rehab Latest Ref Rng & Units 03/20/2016 07/29/2016 01/17/2017 05/27/2017 01/27/2018   Cholestrol 0 - 200 mg/dL 230(H) 215(H) 199 159 144   LDLCALC 0 - 99 mg/dL 140(H) 125(H) 110(H) 74 78   LDLDIRECT mg/dL - - - - -   HDL >39.00 mg/dL 67.30 54.20 67.30 64.30 44.10   Trlycerides 0.0 - 149.0 mg/dL 113.0 178.0(H) 107.0 101.0 107.0   Hemoglobin A1c 4.6 - 6.5 % 5.9 6.0 5.8 5.7 5.6    TCO2 0 - 100 mmol/L - - - - -       Pulmonary Assessment Scores: Pulmonary Assessment Scores    Row Name 11/25/17 1158 02/16/18 1359       ADL UCSD   ADL Phase  Entry  Entry    SOB Score total  75  57    Rest  0  0    Walk  3  2    Stairs  5  5    Bath  5  4    Dress  5  5    Shop  5  4      CAT Score   CAT Score  20  22      mMRC Score   mMRC Score  -  0       Pulmonary Function Assessment: Pulmonary Function Assessment - 02/16/18 1425      Breath   Bilateral Breath Sounds  Clear    Shortness of Breath  Limiting activity;No       Exercise Target Goals: Exercise Program Goal: Individual exercise prescription set using results from initial 6 min walk test and THRR while considering  patient's activity barriers and safety.   Exercise Prescription Goal: Initial exercise prescription builds to 30-45 minutes a day of aerobic activity, 2-3 days per week.  Home exercise guidelines will be given to patient during program as part of exercise prescription that the participant will acknowledge.  Activity Barriers & Risk Stratification: Activity Barriers & Cardiac Risk Stratification - 02/16/18 1444      Activity Barriers & Cardiac Risk Stratification   Activity Barriers  Deconditioning;Muscular Weakness;Balance Concerns;History of Falls;Other (comment);Decreased Ventricular Function    Comments  bilateral leg pain when walking       6 Minute Walk: 6 Minute Walk    Row Name 02/16/18 1442         6 Minute Walk   Phase  Initial     Distance  830 feet     Walk Time  4.55 minutes     # of Rest Breaks  1 1:27     MPH  2.07     METS  1.05     RPE  11     Perceived Dyspnea   0     VO2 Peak  3.67     Symptoms  Yes (comment)     Comments  leg fatigued/painful from hips down back of both legs     Resting HR  64 bpm     Resting BP  128/72     Resting Oxygen Saturation   96 %     Exercise Oxygen Saturation  during 6 min walk  93 %     Max Ex.  HR  88 bpm     Max Ex.  BP  132/72     2 Minute Post BP  128/70       Interval HR   1 Minute HR  78     2 Minute HR  84     3 Minute HR  87     4 Minute HR  81     5 Minute HR  75     6 Minute HR  88     2 Minute Post HR  67     Interval Heart Rate?  Yes       Interval Oxygen   Interval Oxygen?  Yes     Baseline Oxygen Saturation %  96 %     1 Minute Oxygen Saturation %  93 %     1 Minute Liters of Oxygen  0 L Room Air     2 Minute Oxygen Saturation %  93 %     2 Minute Liters of Oxygen  0 L     3 Minute Oxygen Saturation %  94 % rest 3:33-5:00     3 Minute Liters of Oxygen  0 L     4 Minute Oxygen Saturation %  94 %     4 Minute Liters of Oxygen  0 L     5 Minute Oxygen Saturation %  94 %     5 Minute Liters of Oxygen  0 L     6 Minute Oxygen Saturation %  96 %     6 Minute Liters of Oxygen  0 L     2 Minute Post Oxygen Saturation %  96 %     2 Minute Post Liters of Oxygen  0 L       Oxygen Initial Assessment: Oxygen Initial Assessment - 02/16/18 1423      Home Oxygen   Home Oxygen Device  None    Sleep Oxygen Prescription  None    Home Exercise Oxygen Prescription  None    Home at Rest Exercise Oxygen Prescription  None      Initial 6 min Walk   Oxygen Used  None      Program Oxygen Prescription   Program Oxygen Prescription  None      Intervention   Short Term Goals  To learn and understand importance of maintaining oxygen saturations>88%;To learn and demonstrate proper pursed lip breathing techniques or other breathing techniques.;To learn and understand importance of monitoring SPO2 with pulse oximeter and demonstrate accurate use of the pulse oximeter.    Long  Term Goals  Verbalizes importance of monitoring SPO2 with pulse oximeter and return demonstration;Maintenance of O2 saturations>88%;Exhibits proper breathing techniques, such as pursed lip breathing or other method taught during program session       Oxygen Re-Evaluation: Oxygen Re-Evaluation    Row Name 03/09/18 1143  03/27/18 1155           Program Oxygen Prescription   Program Oxygen Prescription  -  None        Home Oxygen   Home Oxygen Device  -  None      Sleep Oxygen Prescription  -  None      Home Exercise Oxygen Prescription  -  None      Home at Rest Exercise Oxygen Prescription  -  None        Goals/Expected Outcomes   Short Term Goals  -  To learn and understand importance of  maintaining oxygen saturations>88%;To learn and demonstrate proper pursed lip breathing techniques or other breathing techniques.;To learn and understand importance of monitoring SPO2 with pulse oximeter and demonstrate accurate use of the pulse oximeter.      Long  Term Goals  -  Verbalizes importance of monitoring SPO2 with pulse oximeter and return demonstration;Maintenance of O2 saturations>88%;Exhibits proper breathing techniques, such as pursed lip breathing or other method taught during program session      Comments  Reviewed PLB technique with pt.  Talked about how it work and it's important to maintaining his exercise saturations.  Patient is using her PLB and is checking her oxygen at home. She checks her oxygen once a day. She was not exercising at all before she came to rehab. Now she is able to do more and is breathing a little easier.      Goals/Expected Outcomes  Short: Become more profiecient at using PLB.   Long: Become independent at using PLB.  Short: check oxygen and heart rate more routinely at home. Long: maintain checking oxygen independently.         Oxygen Discharge (Final Oxygen Re-Evaluation): Oxygen Re-Evaluation - 03/27/18 1155      Program Oxygen Prescription   Program Oxygen Prescription  None      Home Oxygen   Home Oxygen Device  None    Sleep Oxygen Prescription  None    Home Exercise Oxygen Prescription  None    Home at Rest Exercise Oxygen Prescription  None      Goals/Expected Outcomes   Short Term Goals  To learn and understand importance of maintaining oxygen  saturations>88%;To learn and demonstrate proper pursed lip breathing techniques or other breathing techniques.;To learn and understand importance of monitoring SPO2 with pulse oximeter and demonstrate accurate use of the pulse oximeter.    Long  Term Goals  Verbalizes importance of monitoring SPO2 with pulse oximeter and return demonstration;Maintenance of O2 saturations>88%;Exhibits proper breathing techniques, such as pursed lip breathing or other method taught during program session    Comments  Patient is using her PLB and is checking her oxygen at home. She checks her oxygen once a day. She was not exercising at all before she came to rehab. Now she is able to do more and is breathing a little easier.    Goals/Expected Outcomes  Short: check oxygen and heart rate more routinely at home. Long: maintain checking oxygen independently.       Initial Exercise Prescription: Initial Exercise Prescription - 02/16/18 1400      Date of Initial Exercise RX and Referring Provider   Date  02/16/18    Referring Provider  Glori Bickers MD      Treadmill   MPH  1.7    Grade  0.5    Minutes  15    METs  2.42      Recumbant Bike   Level  1    RPM  50    Minutes  15    METs  1.5      NuStep   Level  1    SPM  80    Minutes  15    METs  1.5      Prescription Details   Frequency (times per week)  3    Duration  Progress to 45 minutes of aerobic exercise without signs/symptoms of physical distress      Intensity   THRR 40-80% of Max Heartrate  --   92-119   Ratings  of Perceived Exertion  11-13    Perceived Dyspnea  0-4      Progression   Progression  Continue to progress workloads to maintain intensity without signs/symptoms of physical distress.      Resistance Training   Training Prescription  Yes    Weight  3 lbs    Reps  10-15       Perform Capillary Blood Glucose checks as needed.  Exercise Prescription Changes: Exercise Prescription Changes    Row Name 02/16/18 1400  03/12/18 1600 03/18/18 1200 03/23/18 1600 04/07/18 1400     Response to Exercise   Blood Pressure (Admit)  128/72  122/60  -  122/72  126/66   Blood Pressure (Exercise)  132/72  140/60  -  -  -   Blood Pressure (Exit)  128/70  122/60  -  122/60  114/56   Heart Rate (Admit)  64 bpm  77 bpm  -  85 bpm  63 bpm   Heart Rate (Exercise)  88 bpm  80 bpm  -  79 bpm  84 bpm   Heart Rate (Exit)  67 bpm  69 bpm  -  73 bpm  73 bpm   Oxygen Saturation (Admit)  96 %  92 %  -  88 %  96 %   Oxygen Saturation (Exercise)  93 %  90 %  -  95 %  90 %   Oxygen Saturation (Exit)  96 %  97 %  -  96 %  95 %   Rating of Perceived Exertion (Exercise)  11  14  -  15  13   Perceived Dyspnea (Exercise)  0  3  -  1  1   Symptoms  leg fatigue/pain bilateral back of both legs  -  -  none  none   Comments  walk test results  1st day  -  -  -   Duration  -  Progress to 45 minutes of aerobic exercise without signs/symptoms of physical distress  -  Progress to 45 minutes of aerobic exercise without signs/symptoms of physical distress  Continue with 45 min of aerobic exercise without signs/symptoms of physical distress.   Intensity  -  THRR unchanged  -  THRR unchanged  THRR unchanged     Progression   Progression  -  Continue to progress workloads to maintain intensity without signs/symptoms of physical distress.  -  Continue to progress workloads to maintain intensity without signs/symptoms of physical distress.  Continue to progress workloads to maintain intensity without signs/symptoms of physical distress.   Average METs  -  2.6  -  2.47  2.46     Resistance Training   Training Prescription  -  Yes  -  Yes  Yes   Weight  -  3 lb  -  3 lbs  3 lbs   Reps  -  10-15  -  10-15  10-15     Interval Training   Interval Training  -  -  -  No  No     Treadmill   MPH  -  1.7  -  1.7  1.8   Grade  -  0.5  -  0.5  0.5   Minutes  -  15  -  15  15   METs  -  2.42  -  2.42  2.5     Recumbant Bike   Level  -  1  -  2    2   RPM   -  50  -  -  -   Watts  -  -  -  14  14   Minutes  -  15  -  15  15   METs  -  3.23  -  2.49  2.49     NuStep   Level  -  1  -  3  4   SPM  -  80  -  -  -   Minutes  -  15  -  15  15   METs  -  2.1  -  2.2  2.4     Home Exercise Plan   Plans to continue exercise at  -  -  Home (comment) walking and treadmill  Home (comment) walking and treadmill  Home (comment) walking and treadmill   Frequency  -  -  Add 1 additional day to program exercise sessions.  Add 1 additional day to program exercise sessions.  Add 1 additional day to program exercise sessions.   Initial Home Exercises Provided  -  -  03/18/18  03/18/18  03/18/18      Exercise Comments: Exercise Comments    Row Name 03/09/18 1142           Exercise Comments  First full day of exercise!  Patient was oriented to gym and equipment including functions, settings, policies, and procedures.  Patient's individual exercise prescription and treatment plan were reviewed.  All starting workloads were established based on the results of the 6 minute walk test done at initial orientation visit.  The plan for exercise progression was also introduced and progression will be customized based on patient's performance and goals.          Exercise Goals and Review: Exercise Goals    Row Name 02/16/18 1447             Exercise Goals   Increase Physical Activity  Yes       Intervention  Provide advice, education, support and counseling about physical activity/exercise needs.;Develop an individualized exercise prescription for aerobic and resistive training based on initial evaluation findings, risk stratification, comorbidities and participant's personal goals.       Expected Outcomes  Short Term: Attend rehab on a regular basis to increase amount of physical activity.;Long Term: Add in home exercise to make exercise part of routine and to increase amount of physical activity.;Long Term: Exercising regularly at least 3-5 days a week.        Increase Strength and Stamina  Yes       Intervention  Provide advice, education, support and counseling about physical activity/exercise needs.;Develop an individualized exercise prescription for aerobic and resistive training based on initial evaluation findings, risk stratification, comorbidities and participant's personal goals.       Expected Outcomes  Short Term: Increase workloads from initial exercise prescription for resistance, speed, and METs.;Long Term: Improve cardiorespiratory fitness, muscular endurance and strength as measured by increased METs and functional capacity (6MWT);Short Term: Perform resistance training exercises routinely during rehab and add in resistance training at home       Able to understand and use rate of perceived exertion (RPE) scale  Yes       Intervention  Provide education and explanation on how to use RPE scale       Expected Outcomes  Short Term: Able to use RPE daily in rehab to express subjective intensity level;Long Term:  Able to use RPE  to guide intensity level when exercising independently       Able to understand and use Dyspnea scale  Yes       Intervention  Provide education and explanation on how to use Dyspnea scale       Expected Outcomes  Short Term: Able to use Dyspnea scale daily in rehab to express subjective sense of shortness of breath during exertion;Long Term: Able to use Dyspnea scale to guide intensity level when exercising independently       Knowledge and understanding of Target Heart Rate Range (THRR)  Yes       Intervention  Provide education and explanation of THRR including how the numbers were predicted and where they are located for reference       Expected Outcomes  Short Term: Able to state/look up THRR;Short Term: Able to use daily as guideline for intensity in rehab;Long Term: Able to use THRR to govern intensity when exercising independently       Able to check pulse independently  Yes       Intervention  Provide education  and demonstration on how to check pulse in carotid and radial arteries.;Review the importance of being able to check your own pulse for safety during independent exercise       Expected Outcomes  Short Term: Able to explain why pulse checking is important during independent exercise;Long Term: Able to check pulse independently and accurately       Understanding of Exercise Prescription  Yes       Intervention  Provide education, explanation, and written materials on patient's individual exercise prescription       Expected Outcomes  Short Term: Able to explain program exercise prescription;Long Term: Able to explain home exercise prescription to exercise independently          Exercise Goals Re-Evaluation : Exercise Goals Re-Evaluation    [place] Name 03/09/18 1143 03/18/18 1228 03/23/18 1622 04/07/18 1417 04/10/18 1235     Exercise Goal Re-Evaluation   Exercise Goals Review  Increase Physical Activity;Increase Strength and Stamina;Able to understand and use rate of perceived exertion (RPE) scale;Knowledge and understanding of Target Heart Rate Range (THRR);Understanding of Exercise Prescription;Able to understand and use Dyspnea scale  Increase Physical Activity;Able to understand and use rate of perceived exertion (RPE) scale;Knowledge and understanding of Target Heart Rate Range (THRR);Understanding of Exercise Prescription;Increase Strength and Stamina;Able to understand and use Dyspnea scale;Able to check pulse independently  Increase Physical Activity;Increase Strength and Stamina;Understanding of Exercise Prescription  Increase Physical Activity;Increase Strength and Stamina;Understanding of Exercise Prescription  Increase Physical Activity;Increase Strength and Stamina;Understanding of Exercise Prescription   Comments  Reviewed RPE scale, THR and program prescription with pt today.  Pt voiced understanding and was given a copy of goals to take home.   Reviewed home exercise with pt today.  Pt plans  to walk and use treadmill at home for exercise.  Reviewed THR, pulse, RPE, sign and symptoms, and when to call 911 or MD.  Also discussed weather considerations and indoor options.  Pt voiced understanding.  Ruth Roth is doing well in rehab. She is still learning the rotation of equipment.  She is getting 14 watts on the recumbent bike.  We will continue to monitor her progress.   Ruth Roth continues to do well in rehab. She is starting to get the hang of rotation.  She is up to 2.5 METs on the Nustep.  We will start to increase her workloads more and continue to monitor her progress.  Ruth Roth is doing well in rehab.  She is getting in some exercise at home for about 10 min for 5 days a week.  We are going to work on increasing her time to 15 min.    Expected Outcomes  Short: Use RPE daily to regulate intensity. Long: Follow program prescription in THR.  Short: Start to add in treadmill at least one day a week.  Long: Continue to exercise more at home.   Short: Increase treadmill workload.  Long: Continue to increase strength and stamina.   Short: Increase workloads.  Long: Continue to move more at home.   Short: Increase walking to 15 min at home.  Long: Continue to exercise daily.       Discharge Exercise Prescription (Final Exercise Prescription Changes): Exercise Prescription Changes - 04/07/18 1400      Response to Exercise   Blood Pressure (Admit)  126/66    Blood Pressure (Exit)  114/56    Heart Rate (Admit)  63 bpm    Heart Rate (Exercise)  84 bpm    Heart Rate (Exit)  73 bpm    Oxygen Saturation (Admit)  96 %    Oxygen Saturation (Exercise)  90 %    Oxygen Saturation (Exit)  95 %    Rating of Perceived Exertion (Exercise)  13    Perceived Dyspnea (Exercise)  1    Symptoms  none    Duration  Continue with 45 min of aerobic exercise without signs/symptoms of physical distress.    Intensity  THRR unchanged      Progression   Progression  Continue to progress workloads to maintain intensity without  signs/symptoms of physical distress.    Average METs  2.46      Resistance Training   Training Prescription  Yes    Weight  3 lbs    Reps  10-15      Interval Training   Interval Training  No      Treadmill   MPH  1.8    Grade  0.5    Minutes  15    METs  2.5      Recumbant Bike   Level  2    Watts  14    Minutes  15    METs  2.49      NuStep   Level  4    Minutes  15    METs  2.4      Home Exercise Plan   Plans to continue exercise at  Home (comment)   walking and treadmill   Frequency  Add 1 additional day to program exercise sessions.    Initial Home Exercises Provided  03/18/18       Nutrition:  Target Goals: Understanding of nutrition guidelines, daily intake of sodium <1541m, cholesterol <2046m calories 30% from fat and 7% or less from saturated fats, daily to have 5 or more servings of fruits and vegetables.  Biometrics: Pre Biometrics - 02/16/18 1447      Pre Biometrics   Height  5' 1.5" (1.562 m)    Weight  132 lb 3.2 oz (60 kg)    Waist Circumference  30 inches    Hip Circumference  39 inches    Waist to Hip Ratio  0.77 %    BMI (Calculated)  24.58    Single Leg Stand  0.54 seconds        Nutrition Therapy Plan and Nutrition Goals: Nutrition Therapy & Goals - 03/09/18 1248  Nutrition Therapy   Diet  DASH    Protein (specify units)  6oz    Fiber  20 grams    Whole Grain Foods  3 servings   eats rye toast, does not regularly choose whole grains   Saturated Fats  11 max. grams    Fruits and Vegetables  5 servings/day   8 ideal; tries to eat fruits and vegetables at least daily   Sodium  2000 grams   per MD     Personal Nutrition Goals   Nutrition Goal  Drink at least one additional glass of fluids per day. Try keeping a full glass near you during the day as a visual reminder to drink more    Personal Goal #2  Limit Himalayan pink salt the same way that you limit other forms of salt. Use salt-free seasonings instead    Personal Goal  #3  Drink adequate fluids each day in conjunction with taking your fluid pill in order to keep wt from excess fluid from accumulating    Comments  She is hesitent to eat foods that may contain sodium, and very seldom eats out. She does not eat fried foods. She has been using [place] pink salt and was unaware that it effected her in the same way as regular table salt. Breakfast: rye toast, egg. Lunch: "snacks" on carrots and/or fruit. Dinner: various vegetables (mostly greens), beans, meats (not daily) such as chicken, fish, [place] or shrimp, salmon, tuna, white fish. Other snack foods: popcorn, cottage cheese occasionally. Beverages: diet Coke, OJ, water. She cooks using olive oil.      Intervention Plan   Intervention  Prescribe, educate and counsel regarding individualized specific dietary modifications aiming towards targeted core components such as weight, hypertension, lipid management, diabetes, heart failure and other comorbidities.    Expected Outcomes  Short Term Goal: Understand basic principles of dietary content, such as calories, fat, sodium, cholesterol and nutrients.;Short Term Goal: A plan has been developed with personal nutrition goals set during dietitian appointment.;Long Term Goal: Adherence to prescribed nutrition plan.       Nutrition Assessments: Nutrition Assessments - 02/16/18 1402      MEDFICTS Scores   Pre Score  0       Nutrition Goals Re-Evaluation: Nutrition Goals Re-Evaluation    [place] Name 03/09/18 1257 04/10/18 1237           Goals   Nutrition Goal  Drink at least one additional glass of fluids per day. Try keeping a full glass near you during the day as a visual reminder to drink more  More fluids, limit pink salt      Comment  She reports wt to have increased from 132# - over 140# within the past couple of weeks d/t being taken off of her fluid pill temporarily. She also feels that she does not drink enough fluids throughout the day.   Ruth Roth is trying to  get in more fluids.  She is now up to 3 16oz waters each day.  She has also stopped using the pink salt but has not tried the other seasoning.  We did talk about some alternatives.       Expected Outcome  Drink one additional glass of water/fluids per day consistently. Continue to take Torsemide as prescribed to limit excess fluid buildup. Monitor wt daily as instructed  Short: Try a Mrs Deliah Boston for garlic  Long: Continue to try to get more fluids.         Personal  Goal #2 Re-Evaluation   Personal Goal #2  Limit Himalayan pink salt the same way that you limit other forms of salt. Use salt free seasonings instead  -        Personal Goal #3 Re-Evaluation   Personal Goal #3  Drink adequate fluids each day in conjunction with taking your fluid pill in order to keep wt from excess fluid from accumulating  -         Nutrition Goals Discharge (Final Nutrition Goals Re-Evaluation): Nutrition Goals Re-Evaluation - 04/10/18 1237      Goals   Nutrition Goal  More fluids, limit pink salt    Comment  Ruth Roth is trying to get in more fluids.  She is now up to 3 16oz waters each day.  She has also stopped using the pink salt but has not tried the other seasoning.  We did talk about some alternatives.     Expected Outcome  Short: Try a Mrs Deliah Boston for garlic  Long: Continue to try to get more fluids.        Psychosocial: Target Goals: Acknowledge presence or absence of significant depression and/or stress, maximize coping skills, provide positive support system. Participant is able to verbalize types and ability to use techniques and skills needed for reducing stress and depression.   Initial Review & Psychosocial Screening: Initial Psych Review & Screening - 02/16/18 1425      Initial Review   Current issues with  None Identified      Family Dynamics   Good Support System?  Yes    Comments  Her daughter in law and her neighbor      Barriers   Psychosocial barriers to participate in program  There are no  identifiable barriers or psychosocial needs.;The patient should benefit from training in stress management and relaxation.      Screening Interventions   Interventions  Encouraged to exercise    Expected Outcomes  Short Term goal: Utilizing psychosocial counselor, staff and physician to assist with identification of specific Stressors or current issues interfering with healing process. Setting desired goal for each stressor or current issue identified.;Long Term Goal: Stressors or current issues are controlled or eliminated.;Short Term goal: Identification and review with participant of any Quality of Life or Depression concerns found by scoring the questionnaire.;Long Term goal: The participant improves quality of Life and PHQ9 Scores as seen by post scores and/or verbalization of changes       Quality of Life Scores:  Scores of 19 and below usually indicate a poorer quality of life in these areas.  A difference of  2-3 points is a clinically meaningful difference.  A difference of 2-3 points in the total score of the Quality of Life Index has been associated with significant improvement in overall quality of life, self-image, physical symptoms, and general health in studies assessing change in quality of life.  PHQ-9: Recent Review Flowsheet Data    Depression screen [facility] 2/9 03/27/2018 02/16/2018 11/25/2017 04/21/2017 07/30/2016   Decreased Interest 0 3 1 0 0   Down, Depressed, Hopeless 0 0 0 0 0   PHQ - 2 Score 0 3 1 0 0   Altered sleeping 0 0 2 - 0   Tired, decreased energy _0 - 0   Change in appetite 1 2 0 - 0   Feeling bad or failure about yourself  0 0 0 - 0   Trouble concentrating 0 0 0 - 0   Moving slowly or  fidgety/restless 0 0 0 - 0   Suicidal thoughts 0 0 0 - 0   PHQ-9 Score 2 8 6 - 0   Difficult doing work/chores Not difficult at all Somewhat difficult Somewhat difficult - -     Interpretation of Total Score  Total Score Depression Severity:  1-4 = Minimal depression, 5-9 =  Mild depression, 10-14 = Moderate depression, 15-19 = Moderately severe depression, 20-27 = Severe depression   Psychosocial Evaluation and Intervention:   Psychosocial Re-Evaluation: Psychosocial Re-Evaluation    Row Name 03/27/18 1425             Psychosocial Re-Evaluation   Current issues with  Current Stress Concerns       Comments  Reviewed patient health questionnaire (PHQ-9) with patient for follow up. Previously, patients score indicated signs/symptoms of depression.  Reviewed to see if patient is improving symptom wise while in program.  Score improved and patient states that it is because she has been able to work out and has more energy.       Expected Outcomes  Short: Continue to attend [name] regularly for regular exercise and social engagement. Long: Continue to improve symptoms and manage a positive mental state       Interventions  Encouraged to attend Pulmonary Rehabilitation for the exercise       Continue Psychosocial Services   Follow up required by staff          Psychosocial Discharge (Final Psychosocial Re-Evaluation): Psychosocial Re-Evaluation - 03/27/18 1425      Psychosocial Re-Evaluation   Current issues with  Current Stress Concerns    Comments  Reviewed patient health questionnaire (PHQ-9) with patient for follow up. Previously, patients score indicated signs/symptoms of depression.  Reviewed to see if patient is improving symptom wise while in program.  Score improved and patient states that it is because she has been able to work out and has more energy.    Expected Outcomes  Short: Continue to attend [name] regularly for regular exercise and social engagement. Long: Continue to improve symptoms and manage a positive mental state    Interventions  Encouraged to attend Pulmonary Rehabilitation for the exercise    Continue Psychosocial Services   Follow up required by staff       Education: Education Goals: Education classes will be provided on a  weekly basis, covering required topics. Participant will state understanding/return demonstration of topics presented.  Learning Barriers/Preferences: Learning Barriers/Preferences - 02/16/18 1403      Learning Barriers/Preferences   Learning Barriers  None    Learning Preferences  Video       Education Topics:  Initial Evaluation Education: - Verbal, written and demonstration of respiratory meds, oximetry and breathing techniques. Instruction on use of nebulizers and MDIs and importance of monitoring MDI activations.   Pulmonary Rehab from 04/17/2018 in [facility]  Date  02/16/18  Educator  JH  Instruction Review Code  1- Verbalizes Understanding      General Nutrition Guidelines/Fats and Fiber: -Group instruction provided by verbal, written material, models and posters to present the general guidelines for heart healthy nutrition. Gives an explanation and review of dietary fats and fiber.   Cardiac Rehab from 09/07/2014 in [facility]  Date  08/29/14  Educator  C. Russell, RD  Instruction Review Code (retired)  2- meets goals/outcomes      Controlling Sodium/Reading Food Labels: -Group verbal and written material supporting the discussion of sodium use in   heart healthy nutrition. Review and explanation with models, verbal and written materials for utilization of the food label.   Cardiac Rehab from 09/07/2014 in [facility]  Date  09/05/14  Educator  CR  Instruction Review Code (retired)  2- meets goals/outcomes      Exercise Physiology & General Exercise Guidelines: - Group verbal and written instruction with models to review the exercise physiology of the cardiovascular system and associated critical values. Provides general exercise guidelines with specific guidelines to those with heart or lung disease.    Pulmonary Rehab from 04/17/2018 in [facility]  Date  04/01/18  Educator  JH   Instruction Review Code  1- Verbalizes Understanding      Aerobic Exercise & Resistance Training: - Gives group verbal and written instruction on the various components of exercise. Focuses on aerobic and resistive training programs and the benefits of this training and how to safely progress through these programs.   Pulmonary Rehab from 04/17/2018 in [facility]  Date  04/03/18  Educator  JH  Instruction Review Code  1- Verbalizes Understanding      Flexibility, Balance, Mind/Body Relaxation: Provides group verbal/written instruction on the benefits of flexibility and balance training, including mind/body exercise modes such as yoga, pilates and tai chi.  Demonstration and skill practice provided.   Pulmonary Rehab from 04/17/2018 in [facility]  Date  04/08/18  Educator  AS  Instruction Review Code  1- Verbalizes Understanding      Stress and Anxiety: - Provides group verbal and written instruction about the health risks of elevated stress and causes of high stress.  Discuss the correlation between heart/lung disease and anxiety and treatment options. Review healthy ways to manage with stress and anxiety.   Pulmonary Rehab from 04/17/2018 in [facility]  Date  04/15/18  Educator  KC  Instruction Review Code  1- Verbalizes Understanding      Depression: - Provides group verbal and written instruction on the correlation between heart/lung disease and depressed mood, treatment options, and the stigmas associated with seeking treatment.   Cardiac Rehab from 09/07/2014 in [facility]  Date  09/07/14  Educator  KC  Instruction Review Code (retired)  2- meets goals/outcomes      Exercise & Equipment Safety: - Individual verbal instruction and demonstration of equipment use and safety with use of the equipment.   Pulmonary Rehab from 04/17/2018 in [facility]  Date  02/16/18   Educator  JH  Instruction Review Code  1- Verbalizes Understanding      Infection Prevention: - Provides verbal and written material to individual with discussion of infection control including proper hand washing and proper equipment cleaning during exercise session.   Pulmonary Rehab from 04/17/2018 in [facility]  Date  02/16/18  Educator  JH  Instruction Review Code  1- Verbalizes Understanding      Falls Prevention: - Provides verbal and written material to individual with discussion of falls prevention and safety.   Pulmonary Rehab from 04/17/2018 in [facility]  Date  02/16/18  Educator  JH  Instruction Review Code  1- Verbalizes Understanding      Diabetes: - Individual verbal and written instruction to review signs/symptoms of diabetes, desired ranges of glucose level fasting, after meals and with exercise. Advice that pre and post exercise glucose checks will be done for   3 sessions at entry of program.   Chronic Lung Diseases: - Group verbal and written instruction to review updates, respiratory medications, advancements in procedures and treatments. Discuss use of supplemental oxygen including available portable oxygen systems, continuous and intermittent flow rates, concentrators, personal use and safety guidelines. Review proper use of inhaler and spacers. Provide informative websites for self-education.    Pulmonary Rehab from 04/17/2018 in [facility]  Date  04/17/18  Educator  JH  Instruction Review Code  1- Verbalizes Understanding      Energy Conservation: - Provide group verbal and written instruction for methods to conserve energy, plan and organize activities. Instruct on pacing techniques, use of adaptive equipment and posture/positioning to relieve shortness of breath.   Triggers and Exacerbations: - Group verbal and written instruction to review types of environmental triggers and ways to  prevent exacerbations. Discuss weather changes, air quality and the benefits of nasal washing. Review warning signs and symptoms to help prevent infections. Discuss techniques for effective airway clearance, coughing, and vibrations.   AED/CPR: - Group verbal and written instruction with the use of models to demonstrate the basic use of the AED with the basic ABC's of resuscitation.   Pulmonary Rehab from 04/17/2018 in [facility]  Date  03/25/18  Educator  MC  Instruction Review Code  1- Verbalizes Understanding      Anatomy and Physiology of the Lungs: - Group verbal and written instruction with the use of models to provide basic lung anatomy and physiology related to function, structure and complications of lung disease.   Anatomy & Physiology of the Heart: - Group verbal and written instruction and models provide basic cardiac anatomy and physiology, with the coronary electrical and arterial systems. Review of Valvular disease and Heart Failure   Pulmonary Rehab from 04/17/2018 in [facility]  Date  03/13/18  Educator  MC  Instruction Review Code  1- Verbalizes Understanding      Cardiac Medications: - Group verbal and written instruction to review commonly prescribed medications for heart disease. Reviews the medication, class of the drug, and side effects.   Pulmonary Rehab from 04/17/2018 in [facility]  Date  03/20/18  Educator  MC  Instruction Review Code  1- Verbalizes Understanding      Know Your Numbers and Risk Factors: -Group verbal and written instruction about important numbers in your health.  Discussion of what are risk factors and how they play a role in the disease process.  Review of Cholesterol, Blood Pressure, Diabetes, and BMI and the role they play in your overall health.   Sleep Hygiene: -Provides group verbal and written instruction about how sleep can affect your health.  Define sleep hygiene,  discuss sleep cycles and impact of sleep habits. Review good sleep hygiene tips.    Pulmonary Rehab from 04/17/2018 in [facility]  Date  03/18/18  Educator  KC  Instruction Review Code  1- Verbalizes Understanding      Other: -Provides group and verbal instruction on various topics (see comments)    Knowledge Questionnaire Score: Knowledge Questionnaire Score - 02/16/18 1401      Knowledge Questionnaire Score   Pre Score  13/18   Reviewed with patient       Core Components/Risk Factors/Patient Goals at Admission: Personal Goals and Risk Factors at Admission - 02/16/18 1427      Core Components/Risk Factors/Patient Goals on Admission      Weight Management  Yes;Weight Loss    Intervention  Weight Management: Develop a combined nutrition and exercise program designed to reach desired caloric intake, while maintaining appropriate intake of nutrient and fiber, sodium and fats, and appropriate energy expenditure required for the weight goal.;Weight Management: Provide education and appropriate resources to help participant work on and attain dietary goals.    Admit Weight  132 lb (59.9 kg)    Goal Weight: Short Term  127 lb (57.6 kg)    Goal Weight: Long Term  120 lb (54.4 kg)    Expected Outcomes  Short Term: Continue to assess and modify interventions until short term weight is achieved;Long Term: Adherence to nutrition and physical activity/exercise program aimed toward attainment of established weight goal;Weight Loss: Understanding of general recommendations for a balanced deficit meal plan, which promotes 1-2 lb weight loss per week and includes a negative energy balance of 500-1000 kcal/d;Understanding recommendations for meals to include 15-35% energy as protein, 25-35% energy from fat, 35-60% energy from carbohydrates, less than 200mg of dietary cholesterol, 20-35 gm of total fiber daily;Understanding of distribution of calorie intake throughout the day with the  consumption of 4-5 meals/snacks    Improve shortness of breath with ADL's  Yes    Intervention  Provide education, individualized exercise plan and daily activity instruction to help decrease symptoms of SOB with activities of daily living.    Expected Outcomes  Short Term: Improve cardiorespiratory fitness to achieve a reduction of symptoms when performing ADLs;Long Term: Be able to perform more ADLs without symptoms or delay the onset of symptoms    Hypertension  Yes    Intervention  Provide education on lifestyle modifcations including regular physical activity/exercise, weight management, moderate sodium restriction and increased consumption of fresh fruit, vegetables, and low fat dairy, alcohol moderation, and smoking cessation.;Monitor prescription use compliance.    Expected Outcomes  Short Term: Continued assessment and intervention until BP is < 140/90mm HG in hypertensive participants. < 130/80mm HG in hypertensive participants with diabetes, heart failure or chronic kidney disease.;Long Term: Maintenance of blood pressure at goal levels.       Core Components/Risk Factors/Patient Goals Review:  Goals and Risk Factor Review    Row Name 04/10/18 1239             Core Components/Risk Factors/Patient Goals Review   Personal Goals Review  Weight Management/Obesity;Improve shortness of breath with ADL's;Hypertension       Review  Ruth Roth has been doing well in rehab.  Her weight has been staying the same.  Her breathing is starting to improve.  She is doing well with her blood pressures and checks them daily at home. She is also monitoring her oxygen levels too.        Expected Outcomes  Short: Continue to work on weight loss.  Long: Continue to improve breathing.           Core Components/Risk Factors/Patient Goals at Discharge (Final Review):  Goals and Risk Factor Review - 04/10/18 1239      Core Components/Risk Factors/Patient Goals Review   Personal Goals Review  Weight  Management/Obesity;Improve shortness of breath with ADL's;Hypertension    Review  Ruth Roth has been doing well in rehab.  Her weight has been staying the same.  Her breathing is starting to improve.  She is doing well with her blood pressures and checks them daily at home. She is also monitoring her oxygen levels too.     Expected Outcomes  Short: Continue to   work on weight loss.  Long: Continue to improve breathing.        ITP Comments: ITP Comments    Row Name 11/25/17 1147 02/16/18 1359 02/23/18 0858 03/23/18 0854 04/20/18 0846   ITP Comments  Medical Evaluation completed. Chart sent for review and changes to Dr. Mark Miller Director of [name]. Diagnosis can be found in [facility] encounter 08/21/17  Medical Evaluation completed. Chart sent for review and changes to Dr. Mark Miller Director of [name]. Diagnosis can be found in [facility] encounter 08/21/17  30 day review completed. ITP sent to Dr. Mark Miller Director of [name]. Continue with ITP unless changes are made by physician.  30 day review completed. ITP sent to Dr. Mark Miller Director of [name]. Continue with ITP unless changes are made by physician.  30 day review completed. ITP sent to Dr. Mark Miller Director of [name]. Continue with ITP unless changes are made by physician.      Comments: 30 day review  

## 2018-04-22 ENCOUNTER — Ambulatory Visit: Payer: Medicare Other

## 2018-04-22 DIAGNOSIS — I5032 Chronic diastolic (congestive) heart failure: Secondary | ICD-10-CM | POA: Diagnosis not present

## 2018-04-22 NOTE — Progress Notes (Signed)
Daily Session Note  Patient Details  Name: Ruth Roth MRN: [id number] Date of Birth: 01-Jul-1928 Referring Provider:     Pulmonary Rehab from 02/16/2018 in [facility]  Referring Provider  Glori Bickers MD      Encounter Date: 04/22/2018  Check In: Session Check In - 04/22/18 1139      Check-In   Supervising physician immediately available to respond to emergencies  [name] immediately available ER MD    Physician(s)  Dr. Kerman Passey and Dr. Mariea Clonts    Location  [facility]    Staff Present  Jasper Loser BS, Exercise Physiologist;Jessica Luan Pulling, MA, RCEP, CCRP, Exercise Physiologist;Joseph Tessie Fass RCP,RRT,BSRT    Medication changes reported      No    Fall or balance concerns reported     No    Tobacco Cessation  No Change    Warm-up and Cool-down  Performed as group-led instruction    Resistance Training Performed  Yes    VAD Patient?  No    PAD/SET Patient?  No      Pain Assessment   Currently in Pain?  No/denies    Multiple Pain Sites  No          Social History   Tobacco Use  Smoking Status Never Smoker  Smokeless Tobacco Never Used    Goals Met:  Proper associated with RPD/PD & O2 Sat Independence with exercise equipment Exercise tolerated well Strength training completed today  Goals Unmet:  Not Applicable  Comments: Pt able to follow exercise prescription today without complaint.  Will continue to monitor for progression.   Dr. Emily Filbert is Medical Director for [facility] and [name].

## 2018-04-23 ENCOUNTER — Ambulatory Visit (INDEPENDENT_AMBULATORY_CARE_PROVIDER_SITE_OTHER): Payer: Medicare Other

## 2018-04-23 VITALS — BP 118/62 | HR 57 | Temp 98.0°F | Resp 14 | Ht 62.0 in | Wt 138.1 lb

## 2018-04-23 DIAGNOSIS — Z0000 Encounter for general adult medical examination without abnormal findings: Secondary | ICD-10-CM | POA: Diagnosis not present

## 2018-04-23 NOTE — Patient Instructions (Addendum)
  Ruth Roth , Thank you for taking time to come for your Medicare Wellness Visit. I appreciate your ongoing commitment to your health goals. Please review the following plan we discussed and let me know if I can assist you in the future.   Follow up as needed.  Return next month for a routine follow up with your doctor.   Bring a copy of your [place] and/or Living Will to be scanned into chart.  Keep all routine maintenance appointments.   Have a great day!  These are the goals we discussed: Goals      Patient Stated   . Increase physical activity (pt-stated)     Add [facility] Program twice a week to current regimen       This is a list of the screening recommended for you and due dates:  Health Maintenance  Topic Date Due  . Mammogram  06/19/2018  . Tetanus Vaccine  08/16/2020  . Flu Shot  Completed  . DEXA scan (bone density measurement)  Completed  . Pneumonia vaccines  Completed

## 2018-04-23 NOTE — Progress Notes (Signed)
Subjective:   LUANNA WEESNER is a 85 y.o. female who presents for Medicare Annual (Subsequent) preventive examination.  Review of Systems:  No ROS.  Medicare Wellness Visit. Additional risk factors are reflected in the social history. Cardiac Risk Factors include: advanced age (>72men, >44 women);hypertension     Objective:     Vitals: BP 118/62 (BP Location: Left Arm, Patient Position: Sitting, Cuff Size: Normal)   Pulse (!) 57   Temp 98 F (36.7 C) (Oral)   Resp 14   Ht 5\' 2"  (1.575 m)   Wt 138 lb 1.9 oz (62.7 kg)   SpO2 94%   BMI 25.26 kg/m   Body mass index is 25.26 kg/m.  Advanced Directives 04/23/2018 02/17/2018 02/16/2018 08/21/2017 08/21/2017 04/21/2017 12/26/2016  Does Patient Have a Medical Advance Directive? Yes Yes No Yes Yes No No  Type of Paramedic of [place];Living will [place];Living will Healthcare Power of [place] of [place] - -  Does patient want to make changes to medical advance directive? No - Patient declined - No - Patient declined No - Patient declined No - Patient declined - -  Copy of [place] in Chart? No - copy requested - - No - copy requested No - copy requested - -  Would patient like information on creating a medical advance directive? - - No - Patient declined No - Patient declined No - Patient declined No - Patient declined No - Patient declined    Tobacco Social History   Tobacco Use  Smoking Status Never Smoker  Smokeless Tobacco Never Used     Counseling given: Not Answered   Clinical Intake:  Pre-visit preparation completed: Yes        Diabetes: No  How often do you need to have someone help you when you read instructions, pamphlets, or other written materials from your doctor or pharmacy?: 1 - Never  Interpreter Needed?: No     Past Medical History:  Diagnosis Date  . (HFpEF) heart failure with preserved  ejection fraction ([place])    a. 07/2017 Echo: EF 50-55%, no rwma, mild AS/AI, mod MR, mod dil LA, mod TR, PASP 72mmHg.  . Autoimmune hepatitis ([place])    followed by Dr [name]  . Fibrocystic breast disease   . Glaucoma   . Hypercholesterolemia   . Hypertension   . Hypothyroidism   . Inflammatory arthritis   . MI (myocardial infarction) ([place])   . Neuropathy   . Non-obstructive Coronary artery disease    a. 05/2014 NSTEMI/Cath: LM nl, LAD mild dzs, LCX 60ost hazy (? culprit), RCA mild dzs. EF 60% by echo-->Med Rx; b. 08/2017 Cath: LM 30ost, LAD min irregs, LCX small, 40ost/p, RCA large, min irregs, RPDA/RPL1 nl, EF 50-55%. 4+MR.  . Osteoarthritis   . Permanent atrial fibrillation    a. CHA2DS2VASc = 6-->Xarelto.  . 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 Severe mitral regurgitation    a. 07/2017 Echo: Mod MR; b. 08/2017 Cath: 4+/Severe MR.   Past Surgical History:  Procedure Laterality Date  . ABDOMINAL HYSTERECTOMY  1963   partial, secondary to fibroids  . APPENDECTOMY    . Billroth    . CARDIAC CATHETERIZATION  05/12/14   [facility]  . CARDIOVERSION N/A 12/26/2016   Procedure: CARDIOVERSION;  Surgeon: [name], MD;  Location: [facility] ORS;  Service: Cardiovascular;  Laterality: N/A;  .  CARDIOVERSION N/A 05/16/2017   Procedure: CARDIOVERSION;  Surgeon: [name], MD;  Location: [facility] ORS;  Service: Cardiovascular;  Laterality: N/A;  . CARDIOVERSION N/A 06/09/2017   Procedure: CARDIOVERSION;  Surgeon: [name], MD;  Location: [facility] ORS;  Service: Cardiovascular;  Laterality: N/A;  . CHOLECYSTECTOMY  1998  . COLONOSCOPY  2009  . RIGHT/LEFT HEART CATH AND CORONARY ANGIOGRAPHY Bilateral 08/21/2017   Procedure: RIGHT/LEFT HEART CATH AND CORONARY ANGIOGRAPHY;  Surgeon: [name], MD;  Location: [facility];  Service: Cardiovascular;  Laterality: Bilateral;  . TEE WITHOUT CARDIOVERSION N/A 08/25/2017   Procedure:  TRANSESOPHAGEAL ECHOCARDIOGRAM (TEE);  Surgeon: Jolaine Artist, MD;  Location: [facility] ENDOSCOPY;  Service: Cardiovascular;  Laterality: N/A;  . UPPER GI ENDOSCOPY  2004   Family History  Problem Relation Age of Onset  . Stroke Mother   . Esophageal cancer Brother        also had lung cancer  . Ovarian cancer Daughter   . Colon cancer Daughter   . Breast cancer Neg Hx    Social History   Socioeconomic History  . Marital status: Widowed    Spouse name: Not on file  . Number of children: Not on file  . Years of education: Not on file  . Highest education level: Not on file  Occupational History  . Not on file  Social Needs  . Financial resource strain: Not hard at all  . Food insecurity:    Worry: Never true    Inability: Never true  . Transportation needs:    Medical: No    Non-medical: No  Tobacco Use  . Smoking status: Never Smoker  . Smokeless tobacco: Never Used  Substance and Sexual Activity  . Alcohol use: No    Alcohol/week: 0.0 standard drinks  . Drug use: No  . Sexual activity: Never  Lifestyle  . Physical activity:    Days per week: 3 days    Minutes per session: 40 min  . Stress: Not at all  Relationships  . Social connections:    Talks on phone: Not on file    Gets together: Not on file    Attends religious service: Not on file    Active member of club or organization: Not on file    Attends meetings of clubs or organizations: Not on file    Relationship status: Not on file  Other Topics Concern  . Not on file  Social History Narrative  . Not on file    Outpatient Encounter Medications as of 04/23/2018  Medication Sig  . brimonidine-timolol (COMBIGAN) 0.2-0.5 % ophthalmic solution Place 1 drop into the left eye 2 (two) times daily.   . dorzolamide (TRUSOPT) 2 % ophthalmic solution Place 1 drop into the left eye 2 (two) times daily.  . fluticasone (FLONASE) 50 MCG/ACT nasal spray Place 2 sprays into both nostrils daily as needed for allergies.     Marland Kitchen gabapentin (NEURONTIN) 300 MG capsule TAKE 2 CAPSULES BY MOUTH FOUR TIMES DAILY  . ipratropium (ATROVENT) 0.06 % nasal spray Place 2 sprays into both nostrils daily as needed for rhinitis.   Marland Kitchen levothyroxine (SYNTHROID, LEVOTHROID) 125 MCG tablet TAKE 1 TABLET(125 MCG) BY MOUTH DAILY BEFORE BREAKFAST  . metoprolol tartrate (LOPRESSOR) 50 MG tablet TAKE 1 TABLET(50 MG) BY MOUTH TWICE DAILY  . pantoprazole (PROTONIX) 40 MG tablet Take 1 tablet (40 mg total) by mouth every evening.  . ranitidine (ZANTAC) 150 MG tablet TAKE 1 TABLET(150 MG) BY MOUTH  DAILY 30 MINUTES BEFORE BREAKFAST  . Rivaroxaban (XARELTO) 15 MG TABS tablet Take 1 tablet (15 mg total) by mouth daily with supper.  . torsemide (DEMADEX) 20 MG tablet Take 1 tablet (20 mg total) by mouth daily.  . traMADol (ULTRAM) 50 MG tablet Take 1 tablet (50 mg total) by mouth daily as needed (for pain.).  Marland Kitchen ursodiol (ACTIGALL) 300 MG capsule Take 300-600 mg by mouth See admin instructions. Take 2 capsules (600 mg) daily after lunch & 1 capsule (300 mg) at night.  . [DISCONTINUED] latanoprost (XALATAN) 0.005 % ophthalmic solution Place 1 drop into both eyes at bedtime.    No facility-administered encounter medications on file as of 04/23/2018.     Activities of Daily Living In your present state of health, do you have any difficulty performing the following activities: 04/23/2018  Hearing? N  Vision? N  Difficulty concentrating or making decisions? N  Walking or climbing stairs? Y  Comment Followed by pulmonary rehab  Dressing or bathing? N  Doing errands, shopping? N  Preparing Food and eating ? N  Using the Toilet? N  In the past six months, have you accidently leaked urine? N  Do you have problems with loss of bowel control? N  Managing your Medications? N  Managing your Finances? N  Housekeeping or managing your Housekeeping? N  Some recent data might be hidden    Patient Care Team: Einar Pheasant, MD as PCP - General (Unknown  Physician Specialty) [name], MD as PCP - Cardiology (Cardiology) Bary Castilla Forest Gleason, MD (General Surgery)    Assessment:   This is a routine wellness examination for [place].  Keep all routine maintenance appointments.   Health Screenings   Mammogram -06/18/17 Colonoscopy -03/19/05 Glaucoma -yes. Visits every 4-6 months Hearing -demonstrates normal hearing during conversation Hemoglobin A1C -01/27/18 (5.6) Cholesterol -01/27/18 (144) Dental-every 6 months  Social  Alcohol intake -no Smoking history- -no Smokers in home?  none Illicit drug use? none Exercise - walking 3 days, 45 minutes Diet -regular Sexually Active- no   Safety  Patient feels safe at home.  Patient does have smoke detectors at home  Patient does wear sunscreen or protective clothing when in direct sunlight  Patient does wear seat belt when driving or riding with others.   Activities of Daily Living Patient can do their own household chores. Denies needing assistance with: driving, feeding themselves, getting from bed to chair, getting to the toilet, bathing/showering, dressing, managing money, climbing flight of stairs, or preparing meals.   Depression Screen Patient denies losing interest in daily life, feeling hopeless, or crying easily over simple problems.   Fall Screen Patient denies being afraid of falling or falling in the last year.   Memory Screen Patient denies problems with memory, misplacing items, and is able to balance checkbook/bank accounts.  Patient is alert, normal appearance, oriented to person/place/and time. Correctly identified the president of the [place], recall of 2/3 objects, and performing simple calculations.  Patient displays appropriate judgement and can read correct time from watch face.   Immunizations The following Immunizations are up to date: Influenza, shingles, pneumonia, and tetanus.   Other Providers Patient Care Team: Einar Pheasant, MD as PCP - General  (Unknown Physician Specialty) [name], MD as PCP - Cardiology (Cardiology) Bary Castilla Forest Gleason, MD (General Surgery)  Exercise Activities and Dietary recommendations Current Exercise Habits: Home exercise routine, Type of exercise: walking, Time (Minutes): 45, Frequency (Times/Week): 3, Weekly Exercise (Minutes/Week): 135, Intensity: Mild  Goals      Patient Stated   . Increase physical activity (pt-stated)     Add Heart Track Program twice a week to current regimen       Fall Risk Fall Risk  04/23/2018 02/16/2018 11/25/2017 04/21/2017 04/18/2016  Falls in the past year? 0 1 Yes No Yes  Number falls in past yr: - 1 1 - 1  Injury with Fall? - 1 Yes - Yes  Risk Factor Category  - High Risk (2 or more Points) High Fall Risk - -  Risk for fall due to : - Impaired balance/gait Impaired balance/gait;Other (Comment) - -  Risk for fall due to: Comment - - patient states she hit her head recently from being too dizzy - -  Follow up - Falls evaluation completed;Education provided;Falls prevention discussed;Follow up appointment Falls evaluation completed;Education provided;Falls prevention discussed;Follow up appointment - Falls prevention discussed   Depression Screen PHQ 2/9 Scores 04/23/2018 03/27/2018 02/16/2018 11/25/2017  PHQ - 2 Score 0 0 3 1  PHQ- 9 Score - 2 8 6      Cognitive Function MMSE - Mini Mental State Exam 04/19/2015  Orientation to time 5  Orientation to Place 5  Registration 3  Attention/ Calculation 5  Recall 3  Language- name 2 objects 2  Language- repeat 1  Language- follow 3 step command 3  Language- read & follow direction 1  Write a sentence 1  Copy design 1  Total score 30     6CIT Screen 04/23/2018 04/21/2017 04/18/2016  What Year? 0 points 0 points 0 points  What month? 0 points 0 points 0 points  What time? 0 points 0 points 0 points  Count back from 20 0 points 0 points 0 points  Months in reverse 0 points 0 points 0 points  Repeat phrase 0 points 0  points 0 points  Total Score 0 0 0    Immunization History  Administered Date(s) Administered  . Influenza, High Dose Seasonal PF 01/23/2016, 01/21/2017, 01/30/2018  . Influenza,inj,Quad PF,6+ Mos 12/24/2012, 01/11/2014, 01/16/2015  . Influenza-Unspecified 12/10/2011  . Pneumococcal Conjugate-13 02/22/2014  . Pneumococcal Polysaccharide-23 07/30/2016  . Td 08/17/2010   Screening Tests Health Maintenance  Topic Date Due  . MAMMOGRAM  06/19/2018  . TETANUS/TDAP  08/16/2020  . INFLUENZA VACCINE  Completed  . DEXA SCAN  Completed  . PNA vac Low Risk Adult  Completed      Plan:    End of life planning; Advance aging; Advanced directives discussed. Copy of current HCPOA/Living Will requested.    I have personally reviewed and noted the following in the patient's chart:   . Medical and social history . Use of alcohol, tobacco or illicit drugs  . Current medications and supplements . Functional ability and status . Nutritional status . Physical activity . Advanced directives . List of other physicians . Hospitalizations, surgeries, and ER visits in previous 12 months . Vitals . Screenings to include cognitive, depression, and falls . Referrals and appointments  In addition, I have reviewed and discussed with patient certain preventive protocols, quality metrics, and best practice recommendations. A written personalized care plan for preventive services as well as general preventive health recommendations were provided to patient.     Varney Biles, LPN  11/30/1939   Reviewed above information.  Agree with assessment and plan.    Dr Nicki Reaper

## 2018-04-24 ENCOUNTER — Encounter: Payer: Medicare Other | Admitting: *Deleted

## 2018-04-24 ENCOUNTER — Ambulatory Visit: Payer: Medicare Other

## 2018-04-24 DIAGNOSIS — I5032 Chronic diastolic (congestive) heart failure: Secondary | ICD-10-CM | POA: Diagnosis not present

## 2018-04-24 NOTE — Progress Notes (Signed)
Daily Session Note  Patient Details  Name: Ruth Roth MRN: [id number] Date of Birth: 1928-07-06 Referring Provider:     Pulmonary Rehab from 02/16/2018 in [facility] Cardiac and Pulmonary Rehab  Referring Provider  Glori Bickers MD      Encounter Date: 04/24/2018  Check In: Session Check In - 04/24/18 1219      Check-In   Supervising physician immediately available to respond to emergencies  [name] immediately available ER MD    Physician(s)  Dr. Cherylann Banas and Clearnce Hasten    Location  [facility]-Cardiac & Pulmonary Rehab    Staff Present  Renita Papa, RN BSN;Jessica Luan Pulling, MA, RCEP, CCRP, Exercise Physiologist;Joseph Tessie Fass RCP,RRT,BSRT    Medication changes reported      No    Fall or balance concerns reported     No    Tobacco Cessation  No Change    Warm-up and Cool-down  Performed as group-led instruction    Resistance Training Performed  Yes    VAD Patient?  No    PAD/SET Patient?  No      Pain Assessment   Currently in Pain?  No/denies          Social History   Tobacco Use  Smoking Status Never Smoker  Smokeless Tobacco Never Used    Goals Met:  Proper associated with RPD/PD & O2 Sat Independence with exercise equipment Using PLB without cueing & demonstrates good technique Exercise tolerated well No report of cardiac concerns or symptoms Strength training completed today  Goals Unmet:  Not Applicable  Comments: Pt able to follow exercise prescription today without complaint.  Will continue to monitor for progression.    Dr. Emily Filbert is Medical Director for [place] and [name].

## 2018-04-27 ENCOUNTER — Ambulatory Visit: Payer: Medicare Other

## 2018-04-27 DIAGNOSIS — I5032 Chronic diastolic (congestive) heart failure: Secondary | ICD-10-CM | POA: Diagnosis not present

## 2018-04-27 NOTE — Progress Notes (Signed)
Daily Session Note  Patient Details  Name: Ruth Roth MRN: [id number] Date of Birth: January 14, 1929 Referring Provider:     Pulmonary Rehab from 02/16/2018 in [facility] Cardiac and Pulmonary Rehab  Referring Provider  Glori Bickers MD      Encounter Date: 04/27/2018  Check In:      Social History   Tobacco Use  Smoking Status Never Smoker  Smokeless Tobacco Never Used    Goals Met:  Proper associated with RPD/PD & O2 Sat Independence with exercise equipment Exercise tolerated well Strength training completed today  Goals Unmet:  Not Applicable  Comments: Pt able to follow exercise prescription today without complaint.  Will continue to monitor for progression.    Dr. Emily Filbert is Medical Director for [place] and [name].

## 2018-04-29 ENCOUNTER — Ambulatory Visit: Payer: Medicare Other

## 2018-04-29 ENCOUNTER — Encounter: Payer: Medicare Other | Admitting: *Deleted

## 2018-04-29 DIAGNOSIS — I5032 Chronic diastolic (congestive) heart failure: Secondary | ICD-10-CM

## 2018-04-29 NOTE — Progress Notes (Signed)
Daily Session Note  Patient Details  Name: Ruth Roth MRN: [id number] Date of Birth: 1928/07/25 Referring Provider:     Pulmonary Rehab from 02/16/2018 in [facility] Cardiac and Pulmonary Rehab  Referring Provider  Glori Bickers MD      Encounter Date: 04/29/2018  Check In: Session Check In - 04/29/18 1117      Check-In   Supervising physician immediately available to respond to emergencies  [name] immediately available ER MD    Physician(s)  Dr. Joni Fears and Jimmye Norman     Location  [facility]-Cardiac & Pulmonary Rehab    Staff Present  Renita Papa, RN BSN;Jessica Luan Pulling, MA, RCEP, CCRP, Exercise Physiologist;Joseph Tessie Fass RCP,RRT,BSRT    Medication changes reported      No    Fall or balance concerns reported     No    Tobacco Cessation  No Change    Warm-up and Cool-down  Performed as group-led instruction    Resistance Training Performed  Yes    VAD Patient?  No    PAD/SET Patient?  No      Pain Assessment   Currently in Pain?  No/denies          Social History   Tobacco Use  Smoking Status Never Smoker  Smokeless Tobacco Never Used    Goals Met:  Proper associated with RPD/PD & O2 Sat Independence with exercise equipment Using PLB without cueing & demonstrates good technique Exercise tolerated well No report of cardiac concerns or symptoms Strength training completed today  Goals Unmet:  Not Applicable  Comments: Pt able to follow exercise prescription today without complaint.  Will continue to monitor for progression.    Dr. Emily Filbert is Medical Director for [place] and [name].

## 2018-04-30 ENCOUNTER — Ambulatory Visit: Payer: Medicare Other | Admitting: Oncology

## 2018-04-30 ENCOUNTER — Other Ambulatory Visit: Payer: Medicare Other

## 2018-05-01 ENCOUNTER — Ambulatory Visit: Payer: Medicare Other

## 2018-05-04 ENCOUNTER — Other Ambulatory Visit: Payer: Self-pay

## 2018-05-04 ENCOUNTER — Ambulatory Visit: Payer: Medicare Other

## 2018-05-04 DIAGNOSIS — D509 Iron deficiency anemia, unspecified: Secondary | ICD-10-CM

## 2018-05-04 DIAGNOSIS — I5032 Chronic diastolic (congestive) heart failure: Secondary | ICD-10-CM

## 2018-05-04 NOTE — Progress Notes (Signed)
Daily Session Note  Patient Details  Name: Ruth Roth MRN: [id number] Date of Birth: 12-25-1928 Referring Provider:     Pulmonary Rehab from 02/16/2018 in [facility] Cardiac and Pulmonary Rehab  Referring Provider  Glori Bickers MD      Encounter Date: 05/04/2018  Check In:      Social History   Tobacco Use  Smoking Status Never Smoker  Smokeless Tobacco Never Used    Goals Met:  Proper associated with RPD/PD & O2 Sat Independence with exercise equipment Exercise tolerated well Strength training completed today  Goals Unmet:  Not Applicable  Comments: Pt able to follow exercise prescription today without complaint.  Will continue to monitor for progression.    Dr. Emily Filbert is Medical Director for [place] and [name].

## 2018-05-05 ENCOUNTER — Inpatient Hospital Stay (HOSPITAL_BASED_OUTPATIENT_CLINIC_OR_DEPARTMENT_OTHER): Payer: Medicare Other | Admitting: Oncology

## 2018-05-05 ENCOUNTER — Other Ambulatory Visit: Payer: Self-pay

## 2018-05-05 ENCOUNTER — Inpatient Hospital Stay: Payer: Medicare Other | Attending: Oncology

## 2018-05-05 ENCOUNTER — Encounter: Payer: Self-pay | Admitting: Oncology

## 2018-05-05 VITALS — BP 116/73 | HR 77 | Temp 97.5°F | Resp 18 | Wt 140.9 lb

## 2018-05-05 DIAGNOSIS — I110 Hypertensive heart disease with heart failure: Secondary | ICD-10-CM | POA: Diagnosis not present

## 2018-05-05 DIAGNOSIS — I5032 Chronic diastolic (congestive) heart failure: Secondary | ICD-10-CM | POA: Insufficient documentation

## 2018-05-05 DIAGNOSIS — Z8711 Personal history of peptic ulcer disease: Secondary | ICD-10-CM | POA: Diagnosis not present

## 2018-05-05 DIAGNOSIS — D509 Iron deficiency anemia, unspecified: Secondary | ICD-10-CM | POA: Diagnosis not present

## 2018-05-05 DIAGNOSIS — K754 Autoimmune hepatitis: Secondary | ICD-10-CM | POA: Diagnosis not present

## 2018-05-05 DIAGNOSIS — E7800 Pure hypercholesterolemia, unspecified: Secondary | ICD-10-CM | POA: Insufficient documentation

## 2018-05-05 DIAGNOSIS — I252 Old myocardial infarction: Secondary | ICD-10-CM | POA: Insufficient documentation

## 2018-05-05 DIAGNOSIS — Z79899 Other long term (current) drug therapy: Secondary | ICD-10-CM | POA: Diagnosis not present

## 2018-05-05 DIAGNOSIS — R040 Epistaxis: Secondary | ICD-10-CM | POA: Diagnosis not present

## 2018-05-05 DIAGNOSIS — I2510 Atherosclerotic heart disease of native coronary artery without angina pectoris: Secondary | ICD-10-CM | POA: Diagnosis not present

## 2018-05-05 DIAGNOSIS — E039 Hypothyroidism, unspecified: Secondary | ICD-10-CM | POA: Insufficient documentation

## 2018-05-05 DIAGNOSIS — I480 Paroxysmal atrial fibrillation: Secondary | ICD-10-CM | POA: Insufficient documentation

## 2018-05-05 DIAGNOSIS — Z7901 Long term (current) use of anticoagulants: Secondary | ICD-10-CM | POA: Insufficient documentation

## 2018-05-05 DIAGNOSIS — D500 Iron deficiency anemia secondary to blood loss (chronic): Secondary | ICD-10-CM

## 2018-05-05 LAB — IRON AND TIBC
Iron: 87 ug/dL (ref 28–170)
Saturation Ratios: 25 % (ref 10.4–31.8)
TIBC: 345 ug/dL (ref 250–450)
UIBC: 258 ug/dL

## 2018-05-05 LAB — CBC WITH DIFFERENTIAL/PLATELET
ABS IMMATURE GRANULOCYTES: 0.02 10*3/uL (ref 0.00–0.07)
Basophils Absolute: 0 10*3/uL (ref 0.0–0.1)
Basophils Relative: 1 %
Eosinophils Absolute: 0.1 10*3/uL (ref 0.0–0.5)
Eosinophils Relative: 2 %
HCT: 36 % (ref 36.0–46.0)
Hemoglobin: 11.4 g/dL — ABNORMAL LOW (ref 12.0–15.0)
IMMATURE GRANULOCYTES: 0 %
Lymphocytes Relative: 22 %
Lymphs Abs: 1.4 10*3/uL (ref 0.7–4.0)
MCH: 31.1 pg (ref 26.0–34.0)
MCHC: 31.7 g/dL (ref 30.0–36.0)
MCV: 98.1 fL (ref 80.0–100.0)
Monocytes Absolute: 0.7 10*3/uL (ref 0.1–1.0)
Monocytes Relative: 11 %
NEUTROS PCT: 64 %
NRBC: 0 % (ref 0.0–0.2)
Neutro Abs: 4 10*3/uL (ref 1.7–7.7)
Platelets: 206 10*3/uL (ref 150–400)
RBC: 3.67 MIL/uL — ABNORMAL LOW (ref 3.87–5.11)
RDW: 16.8 % — ABNORMAL HIGH (ref 11.5–15.5)
WBC: 6.2 10*3/uL (ref 4.0–10.5)

## 2018-05-05 LAB — FERRITIN: Ferritin: 282 ng/mL (ref 11–307)

## 2018-05-05 NOTE — Progress Notes (Signed)
Patient here for follow up. No concerns voiced.  °

## 2018-05-06 ENCOUNTER — Ambulatory Visit: Payer: Medicare Other

## 2018-05-06 ENCOUNTER — Encounter: Payer: Medicare Other | Admitting: *Deleted

## 2018-05-06 DIAGNOSIS — I5032 Chronic diastolic (congestive) heart failure: Secondary | ICD-10-CM | POA: Diagnosis not present

## 2018-05-06 NOTE — Progress Notes (Signed)
Daily Session Note  Patient Details  Name: Ruth Roth MRN: [id number] Date of Birth: 1929-02-09 Referring Provider:     Pulmonary Rehab from 02/16/2018 in [facility] Cardiac and Pulmonary Rehab  Referring Provider  Glori Bickers MD      Encounter Date: 05/06/2018  Check In: Session Check In - 05/06/18 1114      Check-In   Supervising physician immediately available to respond to emergencies  [name] immediately available ER MD    Physician(s)  Drs. Guadalupe Dawn    Location  [facility]-Cardiac & Pulmonary Rehab    Staff Present  Alberteen Sam, MA, RCEP, CCRP, Exercise Physiologist;Joseph Alcus Dad, RN BSN    Medication changes reported      No    Fall or balance concerns reported     No    Warm-up and Cool-down  Performed as group-led Higher education careers adviser Performed  Yes    VAD Patient?  No    PAD/SET Patient?  No      Pain Assessment   Currently in Pain?  No/denies          Social History   Tobacco Use  Smoking Status Never Smoker  Smokeless Tobacco Never Used    Goals Met:  Proper associated with RPD/PD & O2 Sat Independence with exercise equipment Using PLB without cueing & demonstrates good technique Exercise tolerated well No report of cardiac concerns or symptoms Strength training completed today  Goals Unmet:  Not Applicable  Comments: Pt able to follow exercise prescription today without complaint.  Will continue to monitor for progression.    Dr. Emily Filbert is Medical Director for [place] and [name].

## 2018-05-06 NOTE — Progress Notes (Signed)
Hematology/Oncology follow up note [facility] [phone] [phone]   Patient Care Team: Einar Pheasant, MD as PCP - General (Unknown Physician Specialty) [name], MD as PCP - Cardiology (Cardiology) Bary Castilla, Forest Gleason, MD (General Surgery)  REFERRING PROVIDER: Einar Pheasant, MD CHIEF COMPLAINTS/REASON FOR VISIT:  Evaluation of anemia  HISTORY OF PRESENTING ILLNESS:  Ruth Roth is a  85 y.o.  female with PMH listed below who was referred to me for evaluation of anemia Reviewed patient's recent labs, Labs revealed anemia with hemoglobin of 10.2 on 01/27/2018. MCV 88.9,   Reviewed patient's previous labs ordered by primary care physician's office, anemia is chronic onset , duration is since 2016, gradually declines. No aggravating or improving factors. She takes Xarelto for A Fib. Has recurrent epistaxis. History of IDA, cannot tolerate oral iron supplements.   Associated signs and symptoms: Patient reports fatigue.  deniesSOB with exertion.  Denies weight loss, easy bruising, hematochezia, hemoptysis, hematuria. Context: History of GI bleeding: deneis               History of Chronic kidney disease: CKD               Last colonoscopy: 2007  INTERVAL HISTORY Ruth Roth is a 85 y.o. female who has above history reviewed by me today presents for follow up visit for management of anemia Problems and complaints are listed below: During the interval, patient has had IV Feraheme 510 mg x 2. Reports feeling much better since IV iron infusion.  Fatigue has significantly improved.  No new complaints.   Review of Systems  Constitutional: Negative for appetite change, chills, fatigue and fever.  HENT:   Negative for hearing loss and voice change.   Eyes: Negative for eye problems.  Respiratory: Negative for chest tightness and cough.   Cardiovascular: Negative for chest pain.  Gastrointestinal: Negative for abdominal  distention, abdominal pain and blood in stool.  Endocrine: Negative for hot flashes.  Genitourinary: Negative for difficulty urinating and frequency.   Musculoskeletal: Negative for arthralgias.  Skin: Negative for itching and rash.  Neurological: Negative for extremity weakness.  Hematological: Negative for adenopathy.  Psychiatric/Behavioral: Negative for confusion.    MEDICAL HISTORY:  Past Medical History:  Diagnosis Date  . (HFpEF) heart failure with preserved ejection fraction ([place])    a. 07/2017 Echo: EF 50-55%, no rwma, mild AS/AI, mod MR, mod dil LA, mod TR, PASP 68mmHg.  . Autoimmune hepatitis ([place])    followed by Dr [name]  . Fibrocystic breast disease   . Glaucoma   . Hypercholesterolemia   . Hypertension   . Hypothyroidism   . Inflammatory arthritis   . MI (myocardial infarction) ([place])   . Neuropathy   . Non-obstructive Coronary artery disease    a. 05/2014 NSTEMI/Cath: LM nl, LAD mild dzs, LCX 60ost hazy (? culprit), RCA mild dzs. EF 60% by echo-->Med Rx; b. 08/2017 Cath: LM 30ost, LAD min irregs, LCX small, 40ost/p, RCA large, min irregs, RPDA/RPL1 nl, EF 50-55%. 4+MR.  . Osteoarthritis   . Permanent atrial fibrillation    a. CHA2DS2VASc = 6-->Xarelto.  . 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 Severe mitral regurgitation    a. 07/2017 Echo: Mod MR; b. 08/2017 Cath: 4+/Severe MR.    SURGICAL HISTORY: Past Surgical History:  Procedure Laterality Date  . ABDOMINAL HYSTERECTOMY  1963   partial, secondary to fibroids  . APPENDECTOMY    .  Billroth    . CARDIAC CATHETERIZATION  05/12/14   [facility]  . CARDIOVERSION N/A 12/26/2016   Procedure: CARDIOVERSION;  Surgeon: [name], MD;  Location: [facility] ORS;  Service: Cardiovascular;  Laterality: N/A;  . CARDIOVERSION N/A 05/16/2017   Procedure: CARDIOVERSION;  Surgeon: [name], MD;  Location: [facility] ORS;  Service: Cardiovascular;  Laterality: N/A;  .  CARDIOVERSION N/A 06/09/2017   Procedure: CARDIOVERSION;  Surgeon: [name], MD;  Location: [facility] ORS;  Service: Cardiovascular;  Laterality: N/A;  . CHOLECYSTECTOMY  1998  . COLONOSCOPY  2009  . RIGHT/LEFT HEART CATH AND CORONARY ANGIOGRAPHY Bilateral 08/21/2017   Procedure: RIGHT/LEFT HEART CATH AND CORONARY ANGIOGRAPHY;  Surgeon: [name], MD;  Location: [place] CV LAB;  Service: Cardiovascular;  Laterality: Bilateral;  . TEE WITHOUT CARDIOVERSION N/A 08/25/2017   Procedure: TRANSESOPHAGEAL ECHOCARDIOGRAM (TEE);  Surgeon: Jolaine Artist, MD;  Location: [facility] ENDOSCOPY;  Service: Cardiovascular;  Laterality: N/A;  . UPPER GI ENDOSCOPY  2004    SOCIAL HISTORY: Social History   Socioeconomic History  . Marital status: Widowed    Spouse name: Not on file  . Number of children: Not on file  . Years of education: Not on file  . Highest education level: Not on file  Occupational History  . Not on file  Social Needs  . Financial resource strain: Not hard at all  . Food insecurity:    Worry: Never true    Inability: Never true  . Transportation needs:    Medical: No    Non-medical: No  Tobacco Use  . Smoking status: Never Smoker  . Smokeless tobacco: Never Used  Substance and Sexual Activity  . Alcohol use: No    Alcohol/week: 0.0 standard drinks  . Drug use: No  . Sexual activity: Never  Lifestyle  . Physical activity:    Days per week: 3 days    Minutes per session: 40 min  . Stress: Not at all  Relationships  . Social connections:    Talks on phone: Not on file    Gets together: Not on file    Attends religious service: Not on file    Active member of club or organization: Not on file    Attends meetings of clubs or organizations: Not on file    Relationship status: Not on file  . Intimate partner violence:    Fear of current or ex partner: Not on file    Emotionally abused: Not on file    Physically abused: Not on file    Forced sexual  activity: Not on file  Other Topics Concern  . Not on file  Social History Narrative  . Not on file    FAMILY HISTORY: Family History  Problem Relation Age of Onset  . Stroke Mother   . Esophageal cancer Brother        also had lung cancer  . Ovarian cancer Daughter   . Colon cancer Daughter   . Breast cancer Neg Hx     ALLERGIES:  is allergic to statins; haldol [haloperidol lactate]; librax [chlordiazepoxide-clidinium]; plavix [clopidogrel bisulfate]; and ramipril.  MEDICATIONS:  Current Outpatient Medications  Medication Sig Dispense Refill  . brimonidine-timolol (COMBIGAN) 0.2-0.5 % ophthalmic solution Place 1 drop into the left eye 2 (two) times daily.     . dorzolamide (TRUSOPT) 2 % ophthalmic solution Place 1 drop into the left eye 2 (two) times daily.    . fluticasone (FLONASE) 50 MCG/ACT nasal spray Place 2  sprays into both nostrils daily as needed for allergies.     Marland Kitchen gabapentin (NEURONTIN) 300 MG capsule TAKE 2 CAPSULES BY MOUTH FOUR TIMES DAILY 720 capsule 0  . ipratropium (ATROVENT) 0.06 % nasal spray Place 2 sprays into both nostrils daily as needed for rhinitis.     Marland Kitchen levothyroxine (SYNTHROID, LEVOTHROID) 125 MCG tablet TAKE 1 TABLET(125 MCG) BY MOUTH DAILY BEFORE BREAKFAST 90 tablet 0  . metoprolol tartrate (LOPRESSOR) 50 MG tablet TAKE 1 TABLET(50 MG) BY MOUTH TWICE DAILY 180 tablet 3  . pantoprazole (PROTONIX) 40 MG tablet Take 1 tablet (40 mg total) by mouth every evening. 90 tablet 3  . Rivaroxaban (XARELTO) 15 MG TABS tablet Take 1 tablet (15 mg total) by mouth daily with supper. 30 tablet 6  . torsemide (DEMADEX) 20 MG tablet Take 1 tablet (20 mg total) by mouth daily. 30 tablet 3  . traMADol (ULTRAM) 50 MG tablet Take 1 tablet (50 mg total) by mouth daily as needed (for pain.). 30 tablet 1  . ursodiol (ACTIGALL) 300 MG capsule Take 300-600 mg by mouth See admin instructions. Take 2 capsules (600 mg) daily after lunch & 1 capsule (300 mg) at night.    .  ranitidine (ZANTAC) 150 MG tablet TAKE 1 TABLET(150 MG) BY MOUTH DAILY 30 MINUTES BEFORE BREAKFAST (Patient not taking: Reported on 05/05/2018) 30 tablet 0   No current facility-administered medications for this visit.      PHYSICAL EXAMINATION: ECOG PERFORMANCE STATUS: 1 - Symptomatic but completely ambulatory Vitals:   05/05/18 1423  BP: 116/73  Pulse: 77  Resp: 18  Temp: (!) 97.5 F (36.4 C)   Filed Weights   05/05/18 1423  Weight: 140 lb 14.4 oz (63.9 kg)    Physical Exam Constitutional:      General: She is not in acute distress. HENT:     Head: Normocephalic and atraumatic.  Eyes:     General: No scleral icterus.    Pupils: Pupils are equal, round, and reactive to light.  Neck:     Musculoskeletal: Normal range of motion and neck supple.  Cardiovascular:     Rate and Rhythm: Normal rate and regular rhythm.     Heart sounds: Normal heart sounds.     Comments: Irregular rhythm.  Pulmonary:     Effort: Pulmonary effort is normal. No respiratory distress.     Breath sounds: No wheezing.  Abdominal:     General: Bowel sounds are normal. There is no distension.     Palpations: Abdomen is soft. There is no mass.     Tenderness: There is no abdominal tenderness.  Musculoskeletal: Normal range of motion.        General: No deformity.  Skin:    General: Skin is warm and dry.     Findings: No erythema or rash.  Neurological:     Mental Status: She is alert and oriented to person, place, and time.     Cranial Nerves: No cranial nerve deficit.     Coordination: Coordination normal.  Psychiatric:        Behavior: Behavior normal.        Thought Content: Thought content normal.      LABORATORY DATA:  I have reviewed the data as listed Lab Results  Component Value Date   WBC 6.2 05/05/2018   HGB 11.4 (L) 05/05/2018   HCT 36.0 05/05/2018   MCV 98.1 05/05/2018   PLT 206 05/05/2018   Recent Labs    05/27/17 0959  08/21/17 2325  01/27/18 1009  02/11/18 1231  02/18/18 1456 02/25/18 1056  NA 139   < > 140   < > 136   < > 139 138 138  K 4.9   < > 4.3   < > 4.2   < > 5.0 4.1 4.7  CL 108   < > 106   < > 97   < > 107 102 103  CO2 24   < > 25   < > 28   < > 23 24 27   GLUCOSE 96   < > 107*   < > 95   < > 66* 96 96  BUN 19   < > 31*   < > 63*   < > 38* 45* 42*  CREATININE 1.13   < > 1.52*   < > 1.71*   < > 1.25* 1.33* 1.35*  CALCIUM 9.4   < > 8.5*   < > 8.4   < > 8.4* 8.3* 8.7*  GFRNONAA  --    < > 30*   < >  --   --  39* 36* 35*  GFRAA  --    < > 34*   < >  --   --  45* 42* 41*  PROT 6.7  --  5.9*  --  6.2  --   --   --   --   ALBUMIN 4.1  --  3.3*  --  3.6  --   --   --   --   AST 16  --  52*  --  14  --   --   --   --   ALT 17  --  98*  --  14  --   --   --   --   ALKPHOS 99  --  111  --  119*  --   --   --   --   BILITOT 0.5  --  0.5  --  0.3  --   --   --   --   BILIDIR 0.1  --   --   --  0.1  --   --   --   --    < > = values in this interval not displayed.   Iron/TIBC/Ferritin/ %Sat    Component Value Date/Time   IRON 87 05/05/2018 1352   TIBC 345 05/05/2018 1352   FERRITIN 282 05/05/2018 1352   IRONPCTSAT 25 05/05/2018 1352        ASSESSMENT & PLAN:  1. Iron deficiency anemia due to chronic blood loss    Status post IV Feraheme 510 mg weekly x2.  Tolerates well. Labs reviewed and discussed with patient.  Both hemoglobin and iron have significantly improved. Hold additional IV iron at this point. She also may have a component of anemia secondary to chronic kidney disease. Recommend to repeat labs, MD assessment in 4 months to ensure stability of iron level and hemoglobin level.  Orders Placed This Encounter  Procedures  . CBC with Differential/Platelet    Standing Status:   Future    Standing Expiration Date:   05/05/2019  . Ferritin    Standing Status:   Future    Standing Expiration Date:   05/06/2019  . Iron and TIBC    Standing Status:   Future    Standing Expiration Date:   05/06/2019    All questions were answered.  The patient knows to call the clinic with  any problems questions or concerns. We spent sufficient time to discuss many aspect of care, questions were answered to patient's satisfaction. Total face to face encounter time for this patient visit was 15 min. >50% of the time was  spent in counseling and coordination of care.   Return of visit: 4 months   Earlie Server, MD, PhD Hematology Oncology [name] Pager- [phone] 05/06/2018

## 2018-05-08 ENCOUNTER — Ambulatory Visit: Payer: Medicare Other

## 2018-05-08 DIAGNOSIS — I5032 Chronic diastolic (congestive) heart failure: Secondary | ICD-10-CM | POA: Diagnosis not present

## 2018-05-08 NOTE — Progress Notes (Signed)
Daily Session Note  Patient Details  Name: Ruth Roth MRN: [id number] Date of Birth: 25-Jul-1928 Referring Provider:     Pulmonary Rehab from 02/16/2018 in [facility] Cardiac and Pulmonary Rehab  Referring Provider  Glori Bickers MD      Encounter Date: 05/08/2018  Check In: Session Check In - 05/08/18 1142      Check-In   Supervising physician immediately available to respond to emergencies  [name] immediately available ER MD    Physician(s)  Dr. Cinda Quest and [facility]    Location  [facility]-Cardiac & Pulmonary Rehab    Staff Present  Justin Mend Lorre Nick, [place], RCEP, CCRP, Exercise Physiologist;Meredith Sherryll Burger, RN BSN    Medication changes reported      No    Fall or balance concerns reported     No    Warm-up and Cool-down  Performed as group-led Higher education careers adviser Performed  Yes    VAD Patient?  No      Pain Assessment   Currently in Pain?  No/denies          Social History   Tobacco Use  Smoking Status Never Smoker  Smokeless Tobacco Never Used    Goals Met:  Independence with exercise equipment Exercise tolerated well No report of cardiac concerns or symptoms Strength training completed today  Goals Unmet:  Not Applicable  Comments: Pt able to follow exercise prescription today without complaint.  Will continue to monitor for progression.    Dr. Emily Filbert is Medical Director for [place] and [name].

## 2018-05-11 ENCOUNTER — Encounter: Payer: Medicare Other | Attending: Internal Medicine

## 2018-05-11 ENCOUNTER — Ambulatory Visit: Payer: Medicare Other

## 2018-05-11 DIAGNOSIS — I5032 Chronic diastolic (congestive) heart failure: Secondary | ICD-10-CM | POA: Diagnosis not present

## 2018-05-11 NOTE — Progress Notes (Signed)
Daily Session Note  Patient Details  Name: NAIJAH LACEK MRN: [id number] Date of Birth: 1928-05-02 Referring Provider:     Pulmonary Rehab from 02/16/2018 in [facility] Cardiac and Pulmonary Rehab  Referring Provider  Glori Bickers MD      Encounter Date: 05/11/2018  Check In:      Social History   Tobacco Use  Smoking Status Never Smoker  Smokeless Tobacco Never Used    Goals Met:  Proper associated with RPD/PD & O2 Sat Independence with exercise equipment Exercise tolerated well Strength training completed today  Goals Unmet:  Not Applicable  Comments: Pt able to follow exercise prescription today without complaint.  Will continue to monitor for progression.    Dr. Emily Filbert is Medical Director for [place] and [name].

## 2018-05-13 ENCOUNTER — Ambulatory Visit: Payer: Medicare Other

## 2018-05-13 ENCOUNTER — Encounter: Payer: Medicare Other | Admitting: *Deleted

## 2018-05-13 DIAGNOSIS — I5032 Chronic diastolic (congestive) heart failure: Secondary | ICD-10-CM | POA: Diagnosis not present

## 2018-05-13 NOTE — Progress Notes (Signed)
Daily Session Note  Patient Details  Name: DEDRA MATSUO MRN: [id number] Date of Birth: [birth year] Referring Provider:     Pulmonary Rehab from 02/16/2018 in [facility] Cardiac and Pulmonary Rehab  Referring Provider  Glori Bickers MD      Encounter Date: 05/13/2018  Check In: Session Check In - 05/13/18 1129      Check-In   Supervising physician immediately available to respond to emergencies  [name] immediately available ER MD    Physician(s)  Dr. Corky Downs and Joni Fears    Location  [facility]-Cardiac & Pulmonary Rehab    Staff Present  Renita Papa, RN BSN;Jessica Luan Pulling, MA, RCEP, CCRP, Exercise Physiologist;Joseph Tessie Fass RCP,RRT,BSRT    Medication changes reported      No    Fall or balance concerns reported     No    Tobacco Cessation  No Change    Warm-up and Cool-down  Performed as group-led instruction    Resistance Training Performed  Yes    VAD Patient?  No    PAD/SET Patient?  No      Pain Assessment   Currently in Pain?  No/denies          Social History   Tobacco Use  Smoking Status Never Smoker  Smokeless Tobacco Never Used    Goals Met:  Proper associated with RPD/PD & O2 Sat Independence with exercise equipment Using PLB without cueing & demonstrates good technique Exercise tolerated well No report of cardiac concerns or symptoms Strength training completed today  Goals Unmet:  Not Applicable  Comments: Pt able to follow exercise prescription today without complaint.  Will continue to monitor for progression.    Dr. Emily Filbert is Medical Director for [place] and [name].

## 2018-05-15 ENCOUNTER — Ambulatory Visit: Payer: Medicare Other

## 2018-05-15 ENCOUNTER — Encounter: Payer: Medicare Other | Admitting: *Deleted

## 2018-05-15 DIAGNOSIS — I5032 Chronic diastolic (congestive) heart failure: Secondary | ICD-10-CM | POA: Diagnosis not present

## 2018-05-15 NOTE — Progress Notes (Signed)
Daily Session Note  Patient Details  Name: Ruth Roth MRN: [id number] Date of Birth: Sep 20, 1928 Referring Provider:     Pulmonary Rehab from 02/16/2018 in [facility] Cardiac and Pulmonary Rehab  Referring Provider  Glori Bickers MD      Encounter Date: 05/15/2018  Check In: Session Check In - 05/15/18 1138      Check-In   Supervising physician immediately available to respond to emergencies  [name] immediately available ER MD    Physician(s)  Dr. Cherylann Banas and Corky Downs    Location  [facility]-Cardiac & Pulmonary Rehab    Staff Present  Renita Papa, RN BSN;Jessica Luan Pulling, MA, RCEP, CCRP, Exercise Physiologist;Joseph Tessie Fass RCP,RRT,BSRT    Medication changes reported      No    Fall or balance concerns reported     No    Tobacco Cessation  No Change    Warm-up and Cool-down  Performed as group-led instruction    Resistance Training Performed  Yes    VAD Patient?  No    PAD/SET Patient?  No      Pain Assessment   Currently in Pain?  No/denies          Social History   Tobacco Use  Smoking Status Never Smoker  Smokeless Tobacco Never Used    Goals Met:  Proper associated with RPD/PD & O2 Sat Independence with exercise equipment Using PLB without cueing & demonstrates good technique Exercise tolerated well No report of cardiac concerns or symptoms Strength training completed today  Goals Unmet:  Not Applicable  Comments: Pt able to follow exercise prescription today without complaint.  Will continue to monitor for progression.    Dr. Emily Filbert is Medical Director for [place] and [name].

## 2018-05-18 ENCOUNTER — Ambulatory Visit: Payer: Medicare Other

## 2018-05-18 ENCOUNTER — Encounter (HOSPITAL_COMMUNITY): Payer: Medicare Other | Admitting: Internal Medicine

## 2018-05-18 DIAGNOSIS — I5032 Chronic diastolic (congestive) heart failure: Secondary | ICD-10-CM

## 2018-05-18 DIAGNOSIS — J019 Acute sinusitis, unspecified: Secondary | ICD-10-CM | POA: Diagnosis not present

## 2018-05-18 DIAGNOSIS — R6889 Other general symptoms and signs: Secondary | ICD-10-CM | POA: Diagnosis not present

## 2018-05-18 NOTE — Progress Notes (Signed)
Pulmonary Individual Treatment Plan  Patient Details  Name: Ruth Roth MRN: [id number] Date of Birth: 1929-01-28 Referring Provider:     Pulmonary Rehab from 02/16/2018 in [facility] Cardiac and Pulmonary Rehab  Referring Provider  Glori Bickers MD      Initial Encounter Date:    Pulmonary Rehab from 02/16/2018 in [facility] Cardiac and Pulmonary Rehab  Date  02/16/18      Visit Diagnosis: Chronic diastolic heart failure ([place])  Patient's Home Medications on Admission:  Current Outpatient Medications:  .  brimonidine-timolol (COMBIGAN) 0.2-0.5 % ophthalmic solution, Place 1 drop into the left eye 2 (two) times daily. , Disp: , Rfl:  .  dorzolamide (TRUSOPT) 2 % ophthalmic solution, Place 1 drop into the left eye 2 (two) times daily., Disp: , Rfl:  .  fluticasone (FLONASE) 50 MCG/ACT nasal spray, Place 2 sprays into both nostrils daily as needed for allergies. , Disp: , Rfl:  .  gabapentin (NEURONTIN) 300 MG capsule, TAKE 2 CAPSULES BY MOUTH FOUR TIMES DAILY, Disp: 720 capsule, Rfl: 0 .  ipratropium (ATROVENT) 0.06 % nasal spray, Place 2 sprays into both nostrils daily as needed for rhinitis. , Disp: , Rfl:  .  levothyroxine (SYNTHROID, LEVOTHROID) 125 MCG tablet, TAKE 1 TABLET(125 MCG) BY MOUTH DAILY BEFORE BREAKFAST, Disp: 90 tablet, Rfl: 0 .  metoprolol tartrate (LOPRESSOR) 50 MG tablet, TAKE 1 TABLET(50 MG) BY MOUTH TWICE DAILY, Disp: 180 tablet, Rfl: 3 .  pantoprazole (PROTONIX) 40 MG tablet, Take 1 tablet (40 mg total) by mouth every evening., Disp: 90 tablet, Rfl: 3 .  ranitidine (ZANTAC) 150 MG tablet, TAKE 1 TABLET(150 MG) BY MOUTH DAILY 30 MINUTES BEFORE BREAKFAST (Patient not taking: Reported on 05/05/2018), Disp: 30 tablet, Rfl: 0 .  Rivaroxaban (XARELTO) 15 MG TABS tablet, Take 1 tablet (15 mg total) by mouth daily with supper., Disp: 30 tablet, Rfl: 6 .  torsemide (DEMADEX) 20 MG tablet, Take 1 tablet (20 mg total) by mouth daily., Disp: 30 tablet, Rfl: 3 .  traMADol (ULTRAM) 50 MG  tablet, Take 1 tablet (50 mg total) by mouth daily as needed (for pain.)., Disp: 30 tablet, Rfl: 1 .  ursodiol (ACTIGALL) 300 MG capsule, Take 300-600 mg by mouth See admin instructions. Take 2 capsules (600 mg) daily after lunch & 1 capsule (300 mg) at night., Disp: , Rfl:   Past Medical History: Past Medical History:  Diagnosis Date  . (HFpEF) heart failure with preserved ejection fraction ([place])    a. 07/2017 Echo: EF 50-55%, no rwma, mild AS/AI, mod MR, mod dil LA, mod TR, PASP 10mHg.  . Autoimmune hepatitis ([place]    followed by Dr SGustavo Lah . Fibrocystic breast disease   . Glaucoma   . Hypercholesterolemia   . Hypertension   . Hypothyroidism   . Inflammatory arthritis   . [place]   . Neuropathy   . Non-obstructive Coronary artery disease    a. 05/2014 NSTEMI/Cath: LM nl, LAD mild dzs, LCX 60ost hazy (? culprit), RCA mild dzs. EF 60% by echo-->Med Rx; b. 08/2017 Cath: LM 30ost, LAD min irregs, LCX small, 40ost/p, RCA large, min irregs, RPDA/RPL1 nl, EF 50-55%. 4+MR.  . Osteoarthritis   . Permanent atrial fibrillation    a. CHA2DS2VASc = 6-->Xarelto.  . 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 Severe mitral regurgitation    a. 07/2017 Echo: Mod MR; b. 08/2017 Cath: 4+/Severe MR.    Tobacco Use:  Social History   Tobacco Use  Smoking Status Never Smoker  Smokeless Tobacco Never Used    Labs: Recent Review Flowsheet Data    Labs for ITP Cardiac and Pulmonary Rehab Latest Ref Rng & Units 03/20/2016 07/29/2016 01/17/2017 05/27/2017 01/27/2018   Cholestrol 0 - 200 mg/dL 230(H) 215(H) 199 159 144   LDLCALC 0 - 99 mg/dL 140(H) 125(H) 110(H) 74 78   LDLDIRECT mg/dL - - - - -   HDL >39.00 mg/dL 67.30 54.20 67.30 64.30 44.10   Trlycerides 0.0 - 149.0 mg/dL 113.0 178.0(H) 107.0 101.0 107.0   Hemoglobin A1c 4.6 - 6.5 % 5.9 6.0 5.8 5.7 5.6   TCO2 0 - 100 mmol/L - - - - -       Pulmonary Assessment  Scores: Pulmonary Assessment Scores    Row Name 11/25/17 1158 02/16/18 1359       ADL UCSD   ADL Phase  Entry  Entry    SOB Score total  75  57    Rest  0  0    Walk  3  2    Stairs  5  5    Bath  5  4    Dress  5  5    Shop  5  4      CAT Score   CAT Score  20  22      mMRC Score   mMRC Score  -  0       Pulmonary Function Assessment: Pulmonary Function Assessment - 02/16/18 1425      Breath   Bilateral Breath Sounds  Clear    Shortness of Breath  Limiting activity;No       Exercise Target Goals: Exercise Program Goal: Individual exercise prescription set using results from initial 6 min walk test and THRR while considering  patient's activity barriers and safety.   Exercise Prescription Goal: Initial exercise prescription builds to 30-45 minutes a day of aerobic activity, 2-3 days per week.  Home exercise guidelines will be given to patient during program as part of exercise prescription that the participant will acknowledge.  Activity Barriers & Risk Stratification: Activity Barriers & Cardiac Risk Stratification - 02/16/18 1444      Activity Barriers & Cardiac Risk Stratification   Activity Barriers  Deconditioning;Muscular Weakness;Balance Concerns;History of Falls;Other (comment);Decreased Ventricular Function    Comments  bilateral leg pain when walking       6 Minute Walk: 6 Minute Walk    Row Name 02/16/18 1442         6 Minute Walk   Phase  Initial     Distance  830 feet     Walk Time  4.55 minutes     # of Rest Breaks  1 1:27     MPH  2.07     METS  1.05     RPE  11     Perceived Dyspnea   0     VO2 Peak  3.67     Symptoms  Yes (comment)     Comments  leg fatigued/painful from hips down back of both legs     Resting HR  64 bpm     Resting BP  128/72     Resting Oxygen Saturation   96 %     Exercise Oxygen Saturation  during 6 min walk  93 %     Max Ex. HR  88 bpm     Max Ex. BP  132/72  2 Minute Post BP  128/70       Interval HR    1 Minute HR  78     2 Minute HR  84     3 Minute HR  87     4 Minute HR  81     5 Minute HR  75     6 Minute HR  88     2 Minute Post HR  67     Interval Heart Rate?  Yes       Interval Oxygen   Interval Oxygen?  Yes     Baseline Oxygen Saturation %  96 %     1 Minute Oxygen Saturation %  93 %     1 Minute Liters of Oxygen  0 L Room Air     2 Minute Oxygen Saturation %  93 %     2 Minute Liters of Oxygen  0 L     3 Minute Oxygen Saturation %  94 % rest 3:33-5:00     3 Minute Liters of Oxygen  0 L     4 Minute Oxygen Saturation %  94 %     4 Minute Liters of Oxygen  0 L     5 Minute Oxygen Saturation %  94 %     5 Minute Liters of Oxygen  0 L     6 Minute Oxygen Saturation %  96 %     6 Minute Liters of Oxygen  0 L     2 Minute Post Oxygen Saturation %  96 %     2 Minute Post Liters of Oxygen  0 L       Oxygen Initial Assessment: Oxygen Initial Assessment - 02/16/18 1423      Home Oxygen   Home Oxygen Device  None    Sleep Oxygen Prescription  None    Home Exercise Oxygen Prescription  None    Home at Rest Exercise Oxygen Prescription  None      Initial 6 min Walk   Oxygen Used  None      Program Oxygen Prescription   Program Oxygen Prescription  None      Intervention   Short Term Goals  To learn and understand importance of maintaining oxygen saturations>88%;To learn and demonstrate proper pursed lip breathing techniques or other breathing techniques.;To learn and understand importance of monitoring SPO2 with pulse oximeter and demonstrate accurate use of the pulse oximeter.    Long  Term Goals  Verbalizes importance of monitoring SPO2 with pulse oximeter and return demonstration;Maintenance of O2 saturations>88%;Exhibits proper breathing techniques, such as pursed lip breathing or other method taught during program session       Oxygen Re-Evaluation: Oxygen Re-Evaluation    Row Name 03/09/18 1143 03/27/18 1155 05/06/18 1228         Program Oxygen  Prescription   Program Oxygen Prescription  -  None  None       Home Oxygen   Home Oxygen Device  -  None  None     Sleep Oxygen Prescription  -  None  None     Home Exercise Oxygen Prescription  -  None  None     Home at Rest Exercise Oxygen Prescription  -  None  None       Goals/Expected Outcomes   Short Term Goals  -  To learn and understand importance of maintaining oxygen saturations>88%;To learn and demonstrate proper pursed lip breathing techniques or  other breathing techniques.;To learn and understand importance of monitoring SPO2 with pulse oximeter and demonstrate accurate use of the pulse oximeter.  To learn and understand importance of maintaining oxygen saturations>88%;To learn and demonstrate proper pursed lip breathing techniques or other breathing techniques.;To learn and understand importance of monitoring SPO2 with pulse oximeter and demonstrate accurate use of the pulse oximeter.     Long  Term Goals  -  Verbalizes importance of monitoring SPO2 with pulse oximeter and return demonstration;Maintenance of O2 saturations>88%;Exhibits proper breathing techniques, such as pursed lip breathing or other method taught during program session  Verbalizes importance of monitoring SPO2 with pulse oximeter and return demonstration;Maintenance of O2 saturations>88%;Exhibits proper breathing techniques, such as pursed lip breathing or other method taught during program session     Comments  Reviewed PLB technique with pt.  Talked about how it work and it's important to maintaining his exercise saturations.  Patient is using her PLB and is checking her oxygen at home. She checks her oxygen once a day. She was not exercising at all before she came to rehab. Now she is able to do more and is breathing a little easier.  Kayla has continued to use her PLB at home.  She noticed that her pulse oximeter needs a new battery but she is struggling to get it done.  She is going to try to bring it in to have [place]  help her get in fixed.       Goals/Expected Outcomes  Short: Become more profiecient at using PLB.   Long: Become independent at using PLB.  Short: check oxygen and heart rate more routinely at home. Long: maintain checking oxygen independently.  Short: Bring her pulse oximeter in to change battery. Long: Continue to use PLB.         Oxygen Discharge (Final Oxygen Re-Evaluation): Oxygen Re-Evaluation - 05/06/18 1228      Program Oxygen Prescription   Program Oxygen Prescription  None      Home Oxygen   Home Oxygen Device  None    Sleep Oxygen Prescription  None    Home Exercise Oxygen Prescription  None    Home at Rest Exercise Oxygen Prescription  None      Goals/Expected Outcomes   Short Term Goals  To learn and understand importance of maintaining oxygen saturations>88%;To learn and demonstrate proper pursed lip breathing techniques or other breathing techniques.;To learn and understand importance of monitoring SPO2 with pulse oximeter and demonstrate accurate use of the pulse oximeter.    Long  Term Goals  Verbalizes importance of monitoring SPO2 with pulse oximeter and return demonstration;Maintenance of O2 saturations>88%;Exhibits proper breathing techniques, such as pursed lip breathing or other method taught during program session    Comments  Biana has continued to use her PLB at home.  She noticed that her pulse oximeter needs a new battery but she is struggling to get it done.  She is going to try to bring it in to have [place] help her get in fixed.      Goals/Expected Outcomes  Short: Bring her pulse oximeter in to change battery. Long: Continue to use PLB.        Initial Exercise Prescription: Initial Exercise Prescription - 02/16/18 1400      Date of Initial Exercise RX and Referring Provider   Date  02/16/18    Referring Provider  Glori Bickers MD      Treadmill   MPH  1.7    Grade  0.5  Minutes  15    METs  2.42      Recumbant Bike   Level  1    RPM  50     Minutes  15    METs  1.5      NuStep   Level  1    SPM  80    Minutes  15    METs  1.5      Prescription Details   Frequency (times per week)  3    Duration  Progress to 45 minutes of aerobic exercise without signs/symptoms of physical distress      Intensity   THRR 40-80% of Max Heartrate  --   92-119   Ratings of Perceived Exertion  11-13    Perceived Dyspnea  0-4      Progression   Progression  Continue to progress workloads to maintain intensity without signs/symptoms of physical distress.      Resistance Training   Training Prescription  Yes    Weight  3 lbs    Reps  10-15       Perform Capillary Blood Glucose checks as needed.  Exercise Prescription Changes: Exercise Prescription Changes    Row Name 02/16/18 1400 03/12/18 1600 03/18/18 1200 03/23/18 1600 04/07/18 1400     Response to Exercise   Blood Pressure (Admit)  128/72  122/60  -  122/72  126/66   Blood Pressure (Exercise)  132/72  140/60  -  -  -   Blood Pressure (Exit)  128/70  122/60  -  122/60  114/56   Heart Rate (Admit)  64 bpm  77 bpm  -  85 bpm  63 bpm   Heart Rate (Exercise)  88 bpm  80 bpm  -  79 bpm  84 bpm   Heart Rate (Exit)  67 bpm  69 bpm  -  73 bpm  73 bpm   Oxygen Saturation (Admit)  96 %  92 %  -  88 %  96 %   Oxygen Saturation (Exercise)  93 %  90 %  -  95 %  90 %   Oxygen Saturation (Exit)  96 %  97 %  -  96 %  95 %   Rating of Perceived Exertion (Exercise)  11  14  -  15  13   Perceived Dyspnea (Exercise)  0  3  -  1  1   Symptoms  leg fatigue/pain bilateral back of both legs  -  -  none  none   Comments  walk test results  1st day  -  -  -   Duration  -  Progress to 45 minutes of aerobic exercise without signs/symptoms of physical distress  -  Progress to 45 minutes of aerobic exercise without signs/symptoms of physical distress  Continue with 45 min of aerobic exercise without signs/symptoms of physical distress.   Intensity  -  THRR unchanged  -  THRR unchanged  THRR unchanged      Progression   Progression  -  Continue to progress workloads to maintain intensity without signs/symptoms of physical distress.  -  Continue to progress workloads to maintain intensity without signs/symptoms of physical distress.  Continue to progress workloads to maintain intensity without signs/symptoms of physical distress.   Average METs  -  2.6  -  2.47  2.46     Resistance Training   Training Prescription  -  Yes  -  Yes  Yes  Weight  -  3 lb  -  3 lbs  3 lbs   Reps  -  10-15  -  10-15  10-15     Interval Training   Interval Training  -  -  -  No  No     Treadmill   MPH  -  1.7  -  1.7  1.8   Grade  -  0.5  -  0.5  0.5   Minutes  -  15  -  15  15   METs  -  2.42  -  2.42  2.5     Recumbant Bike   Level  -  1  -  2  2   RPM  -  50  -  -  -   Watts  -  -  -  14  14   Minutes  -  15  -  15  15   METs  -  3.23  -  2.49  2.49     NuStep   Level  -  1  -  3  4   SPM  -  80  -  -  -   Minutes  -  15  -  15  15   METs  -  2.1  -  2.2  2.4     Home Exercise Plan   Plans to continue exercise at  -  -  Home (comment) walking and treadmill  Home (comment) walking and treadmill  Home (comment) walking and treadmill   Frequency  -  -  Add 1 additional day to program exercise sessions.  Add 1 additional day to program exercise sessions.  Add 1 additional day to program exercise sessions.   Initial Home Exercises Provided  -  -  03/18/18  03/18/18  03/18/18   Row Name 04/21/18 1300 05/05/18 1500           Response to Exercise   Blood Pressure (Admit)  122/64  120/60      Blood Pressure (Exit)  110/72  98/58      Heart Rate (Admit)  60 bpm  67 bpm      Heart Rate (Exercise)  78 bpm  88 bpm      Heart Rate (Exit)  67 bpm  84 bpm      Oxygen Saturation (Admit)  99 %  89 %      Oxygen Saturation (Exercise)  87 %  92 %      Oxygen Saturation (Exit)  98 %  94 %      Rating of Perceived Exertion (Exercise)  13  15      Perceived Dyspnea (Exercise)  0  3      Symptoms  none  none       Duration  Continue with 45 min of aerobic exercise without signs/symptoms of physical distress.  Continue with 45 min of aerobic exercise without signs/symptoms of physical distress.      Intensity  THRR unchanged  THRR unchanged        Progression   Progression  Continue to progress workloads to maintain intensity without signs/symptoms of physical distress.  Continue to progress workloads to maintain intensity without signs/symptoms of physical distress.      Average METs  2.53  2.56        Resistance Training   Training Prescription  Yes  Yes      Weight  3 lbs  3 lbs      Reps  10-15  10-15        Interval Training   Interval Training  No  No        Treadmill   MPH  1.8  1.8      Grade  0.5  0.5      Minutes  15  15      METs  2.5  2.5        Recumbant Bike   Level  2  4      Watts  16  19      Minutes  15  15      METs  2.67  2.79        NuStep   Level  4  5      Minutes  15  15      METs  2.3  2.4        Home Exercise Plan   Plans to continue exercise at  Home (comment) walking and treadmill  Home (comment) walking and treadmill      Frequency  Add 1 additional day to program exercise sessions.  Add 2 additional days to program exercise sessions.      Initial Home Exercises Provided  03/18/18  03/18/18         Exercise Comments: Exercise Comments    Row Name 03/09/18 1142           Exercise Comments  First full day of exercise!  Patient was oriented to gym and equipment including functions, settings, policies, and procedures.  Patient's individual exercise prescription and treatment plan were reviewed.  All starting workloads were established based on the results of the 6 minute walk test done at initial orientation visit.  The plan for exercise progression was also introduced and progression will be customized based on patient's performance and goals.          Exercise Goals and Review: Exercise Goals    Row Name 02/16/18 1447              Exercise Goals   Increase Physical Activity  Yes       Intervention  Provide advice, education, support and counseling about physical activity/exercise needs.;Develop an individualized exercise prescription for aerobic and resistive training based on initial evaluation findings, risk stratification, comorbidities and participant's personal goals.       Expected Outcomes  Short Term: Attend rehab on a regular basis to increase amount of physical activity.;Long Term: Add in home exercise to make exercise part of routine and to increase amount of physical activity.;Long Term: Exercising regularly at least 3-5 days a week.       Increase Strength and Stamina  Yes       Intervention  Provide advice, education, support and counseling about physical activity/exercise needs.;Develop an individualized exercise prescription for aerobic and resistive training based on initial evaluation findings, risk stratification, comorbidities and participant's personal goals.       Expected Outcomes  Short Term: Increase workloads from initial exercise prescription for resistance, speed, and METs.;Long Term: Improve cardiorespiratory fitness, muscular endurance and strength as measured by increased METs and functional capacity (6MWT);Short Term: Perform resistance training exercises routinely during rehab and add in resistance training at home       Able to understand and use rate of perceived exertion (RPE) scale  Yes       Intervention  Provide education and explanation on how to use RPE scale  Expected Outcomes  Short Term: Able to use RPE daily in rehab to express subjective intensity level;Long Term:  Able to use RPE to guide intensity level when exercising independently       Able to understand and use Dyspnea scale  Yes       Intervention  Provide education and explanation on how to use Dyspnea scale       Expected Outcomes  Short Term: Able to use Dyspnea scale daily in rehab to express subjective sense of  shortness of breath during exertion;Long Term: Able to use Dyspnea scale to guide intensity level when exercising independently       Knowledge and understanding of Target Heart Rate Range (THRR)  Yes       Intervention  Provide education and explanation of THRR including how the numbers were predicted and where they are located for reference       Expected Outcomes  Short Term: Able to state/look up THRR;Short Term: Able to use daily as guideline for intensity in rehab;Long Term: Able to use THRR to govern intensity when exercising independently       Able to check pulse independently  Yes       Intervention  Provide education and demonstration on how to check pulse in carotid and radial arteries.;Review the importance of being able to check your own pulse for safety during independent exercise       Expected Outcomes  Short Term: Able to explain why pulse checking is important during independent exercise;Long Term: Able to check pulse independently and accurately       Understanding of Exercise Prescription  Yes       Intervention  Provide education, explanation, and written materials on patient's individual exercise prescription       Expected Outcomes  Short Term: Able to explain program exercise prescription;Long Term: Able to explain home exercise prescription to exercise independently          Exercise Goals Re-Evaluation : Exercise Goals Re-Evaluation    [place] Name 03/09/18 1143 03/18/18 1228 03/23/18 1622 04/07/18 1417 04/10/18 1235     Exercise Goal Re-Evaluation   Exercise Goals Review  Increase Physical Activity;Increase Strength and Stamina;Able to understand and use rate of perceived exertion (RPE) scale;Knowledge and understanding of Target Heart Rate Range (THRR);Understanding of Exercise Prescription;Able to understand and use Dyspnea scale  Increase Physical Activity;Able to understand and use rate of perceived exertion (RPE) scale;Knowledge and understanding of Target Heart Rate  Range (THRR);Understanding of Exercise Prescription;Increase Strength and Stamina;Able to understand and use Dyspnea scale;Able to check pulse independently  Increase Physical Activity;Increase Strength and Stamina;Understanding of Exercise Prescription  Increase Physical Activity;Increase Strength and Stamina;Understanding of Exercise Prescription  Increase Physical Activity;Increase Strength and Stamina;Understanding of Exercise Prescription   Comments  Reviewed RPE scale, THR and program prescription with pt today.  Pt voiced understanding and was given a copy of goals to take home.   Reviewed home exercise with pt today.  Pt plans to walk and use treadmill at home for exercise.  Reviewed THR, pulse, RPE, sign and symptoms, and when to call 911 or MD.  Also discussed weather considerations and indoor options.  Pt voiced understanding.  Sharnetta is doing well in rehab. She is still learning the rotation of equipment.  She is getting 14 watts on the recumbent bike.  We will continue to monitor her progress.   Raynette continues to do well in rehab. She is starting to get the hang of rotation.  She  is up to 2.5 METs on the Nustep.  We will start to increase her workloads more and continue to monitor her progress.   Lashawne is doing well in rehab.  She is getting in some exercise at home for about 10 min for 5 days a week.  We are going to work on increasing her time to 15 min.    Expected Outcomes  Short: Use RPE daily to regulate intensity. Long: Follow program prescription in THR.  Short: Start to add in treadmill at least one day a week.  Long: Continue to exercise more at home.   Short: Increase treadmill workload.  Long: Continue to increase strength and stamina.   Short: Increase workloads.  Long: Continue to move more at home.   Short: Increase walking to 15 min at home.  Long: Continue to exercise daily.    [place] Name 04/21/18 1318 05/05/18 1511 05/06/18 1217         Exercise Goal Re-Evaluation   Exercise Goals  Review  Increase Physical Activity;Increase Strength and Stamina;Understanding of Exercise Prescription  Increase Physical Activity;Increase Strength and Stamina;Understanding of Exercise Prescription  Increase Physical Activity;Increase Strength and Stamina;Understanding of Exercise Prescription     Comments  Aryana continues to do well in rehab.  She is up to level 4 on the NuStep.  We will continue to monitor her progress.   Shanautica has been doing well in rehab.  She has increase her NuStep to level 5 and level 4 on the bike.  We will continue to monitor her progress.   Cierria continues to do well in rehab.  Today she woke up a little sore all over.  She is getting in some home exercise and averages about 2-3 days a home.  She usually will use the treadmill for 15 min.  She tries to get 30 min sometimes. She has had some weak and dizzy spells this week.  She has not told the doctor about them yet.      Expected Outcomes  Short: Increase recumbent bike.  Long: Continue to increase strength and stamina  Short: Increase workload on treadmill.  Long: Continue to improve physical activity levels.   Short: Talk to doctor about dizzy spells and increase time to 30 min more consistently.  Long: Continue to increase walking at home.         Discharge Exercise Prescription (Final Exercise Prescription Changes): Exercise Prescription Changes - 05/05/18 1500      Response to Exercise   Blood Pressure (Admit)  120/60    Blood Pressure (Exit)  98/58    Heart Rate (Admit)  67 bpm    Heart Rate (Exercise)  88 bpm    Heart Rate (Exit)  84 bpm    Oxygen Saturation (Admit)  89 %    Oxygen Saturation (Exercise)  92 %    Oxygen Saturation (Exit)  94 %    Rating of Perceived Exertion (Exercise)  15    Perceived Dyspnea (Exercise)  3    Symptoms  none    Duration  Continue with 45 min of aerobic exercise without signs/symptoms of physical distress.    Intensity  THRR unchanged      Progression   Progression  Continue  to progress workloads to maintain intensity without signs/symptoms of physical distress.    Average METs  2.56      Resistance Training   Training Prescription  Yes    Weight  3 lbs    Reps  10-15  Interval Training   Interval Training  No      Treadmill   MPH  1.8    Grade  0.5    Minutes  15    METs  2.5      Recumbant Bike   Level  4    Watts  19    Minutes  15    METs  2.79      NuStep   Level  5    Minutes  15    METs  2.4      Home Exercise Plan   Plans to continue exercise at  Home (comment)   walking and treadmill   Frequency  Add 2 additional days to program exercise sessions.    Initial Home Exercises Provided  03/18/18       Nutrition:  Target Goals: Understanding of nutrition guidelines, daily intake of sodium <1573m, cholesterol <2062m calories 30% from fat and 7% or less from saturated fats, daily to have 5 or more servings of fruits and vegetables.  Biometrics: Pre Biometrics - 02/16/18 1447      Pre Biometrics   Height  5' 1.5" (1.562 m)    Weight  132 lb 3.2 oz (60 kg)    Waist Circumference  30 inches    Hip Circumference  39 inches    Waist to Hip Ratio  0.77 %    BMI (Calculated)  24.58    Single Leg Stand  0.54 seconds        Nutrition Therapy Plan and Nutrition Goals: Nutrition Therapy & Goals - 03/09/18 1248      Nutrition Therapy   Diet  DASH    Protein (specify units)  6oz    Fiber  20 grams    Whole Grain Foods  3 servings   eats rye toast, does not regularly choose whole grains   Saturated Fats  11 max. grams    Fruits and Vegetables  5 servings/day   8 ideal; tries to eat fruits and vegetables at least daily   Sodium  2000 grams   per MD     Personal Nutrition Goals   Nutrition Goal  Drink at least one additional glass of fluids per day. Try keeping a full glass near you during the day as a visual reminder to drink more    Personal Goal #2  Limit Himalayan pink salt the same way that you limit other forms of  salt. Use salt-free seasonings instead    Personal Goal #3  Drink adequate fluids each day in conjunction with taking your fluid pill in order to keep wt from excess fluid from accumulating    Comments  She is hesitent to eat foods that may contain sodium, and very seldom eats out. She does not eat fried foods. She has been using HiWaldronink salt and was unaware that it effected her in the same way as regular table salt. Breakfast: rye toast, egg. Lunch: "snacks" on carrots and/or fruit. Dinner: various vegetables (mostly greens), beans, meats (not daily) such as chicken, fish, tuKuwaitr shrimp, salmon, tuna, white fish. Other snack foods: popcorn, cottage cheese occasionally. Beverages: diet Coke, OJ, water. She cooks using olive oil.      Intervention Plan   Intervention  Prescribe, educate and counsel regarding individualized specific dietary modifications aiming towards targeted core components such as weight, hypertension, lipid management, diabetes, heart failure and other comorbidities.    Expected Outcomes  Short Term Goal: Understand basic principles of dietary content,  such as calories, fat, sodium, cholesterol and nutrients.;Short Term Goal: A plan has been developed with personal nutrition goals set during dietitian appointment.;Long Term Goal: Adherence to prescribed nutrition plan.       Nutrition Assessments: Nutrition Assessments - 02/16/18 1402      MEDFICTS Scores   Pre Score  0       Nutrition Goals Re-Evaluation: Nutrition Goals Re-Evaluation    Cherry Log Name 03/09/18 1257 04/10/18 1237 05/06/18 1223         Goals   Nutrition Goal  Drink at least one additional glass of fluids per day. Try keeping a full glass near you during the day as a visual reminder to drink more  More fluids, limit pink salt  More fluids, heart healthy, limit sodium     Comment  She reports wt to have increased from 132# - over 140# within the past couple of weeks d/t being taken off of her fluid  pill temporarily. She also feels that she does not drink enough fluids throughout the day.   Priti is trying to get in more fluids.  She is now up to 3 16oz waters each day.  She has also stopped using the pink salt but has not tried the other seasoning.  We did talk about some alternatives.   Cletis has been watching her diet.  She is really focused on cutting back her salt intake and is down to about 1077m a day most days of the week.  She has tried to increase her fluid intake more.  She is still getting more fruits and vegetables.  She still has not tried the Mrs. Dash seasoning yet.      Expected Outcome  Drink one additional glass of water/fluids per day consistently. Continue to take Torsemide as prescribed to limit excess fluid buildup. Monitor wt daily as instructed  Short: Try a Mrs DDeliah Bostonfor garlic  Long: Continue to try to get more fluids.   Short: Increase fluid intake.  Long: Continue to eat better.        Personal Goal #2 Re-Evaluation   Personal Goal #2  Limit Himalayan pink salt the same way that you limit other forms of salt. Use salt free seasonings instead  -  -       Personal Goal #3 Re-Evaluation   Personal Goal #3  Drink adequate fluids each day in conjunction with taking your fluid pill in order to keep wt from excess fluid from accumulating  -  -        Nutrition Goals Discharge (Final Nutrition Goals Re-Evaluation): Nutrition Goals Re-Evaluation - 05/06/18 1223      Goals   Nutrition Goal  More fluids, heart healthy, limit sodium    Comment  IYazmynehas been watching her diet.  She is really focused on cutting back her salt intake and is down to about 1002ma day most days of the week.  She has tried to increase her fluid intake more.  She is still getting more fruits and vegetables.  She still has not tried the Mrs. Dash seasoning yet.     Expected Outcome  Short: Increase fluid intake.  Long: Continue to eat better.        Psychosocial: Target Goals: Acknowledge presence  or absence of significant depression and/or stress, maximize coping skills, provide positive support system. Participant is able to verbalize types and ability to use techniques and skills needed for reducing stress and depression.   Initial Review & Psychosocial  Screening: Initial Psych Review & Screening - 02/16/18 1425      Initial Review   Current issues with  None Identified      Family Dynamics   Good Support System?  Yes    Comments  Her daughter in law and her neighbor      Barriers   Psychosocial barriers to participate in program  There are no identifiable barriers or psychosocial needs.;The patient should benefit from training in stress management and relaxation.      Screening Interventions   Interventions  Encouraged to exercise    Expected Outcomes  Short Term goal: Utilizing psychosocial counselor, staff and physician to assist with identification of specific Stressors or current issues interfering with healing process. Setting desired goal for each stressor or current issue identified.;Long Term Goal: Stressors or current issues are controlled or eliminated.;Short Term goal: Identification and review with participant of any Quality of Life or Depression concerns found by scoring the questionnaire.;Long Term goal: The participant improves quality of Life and PHQ9 Scores as seen by post scores and/or verbalization of changes       Quality of Life Scores:  Scores of 19 and below usually indicate a poorer quality of life in these areas.  A difference of  2-3 points is a clinically meaningful difference.  A difference of 2-3 points in the total score of the Quality of Life Index has been associated with significant improvement in overall quality of life, self-image, physical symptoms, and general health in studies assessing change in quality of life.  PHQ-9: Recent Review Flowsheet Data    Depression screen [facility] 2/9 04/23/2018 03/27/2018 02/16/2018 11/25/2017 04/21/2017   Decreased  Interest 0 0 3 1 0   Down, Depressed, Hopeless 0 0 0 0 0   PHQ - 2 Score 0 0 3 1 0   Altered sleeping - 0 0 2 -   Tired, decreased energy - _0 -   Change in appetite - 1 2 0 -   Feeling bad or failure about yourself  - 0 0 0 -   Trouble concentrating - 0 0 0 -   Moving slowly or fidgety/restless - 0 0 0 -   Suicidal thoughts - 0 0 0 -   PHQ-9 Score - _1 -   Difficult doing work/chores - Not difficult at all Somewhat difficult Somewhat difficult -     Interpretation of Total Score  Total Score Depression Severity:  1-4 = Minimal depression, 5-9 = Mild depression, 10-14 = Moderate depression, 15-19 = Moderately severe depression, 20-27 = Severe depression   Psychosocial Evaluation and Intervention:   Psychosocial Re-Evaluation: Psychosocial Re-Evaluation    Row Name 03/27/18 1425 05/06/18 1220           Psychosocial Re-Evaluation   Current issues with  Current Stress Concerns  Current Stress Concerns      Comments  Reviewed patient health questionnaire (PHQ-9) with patient for follow up. Previously, patients score indicated signs/symptoms of depression.  Reviewed to see if patient is improving symptom wise while in program.  Score improved and patient states that it is because she has been able to work out and has more energy.  Callen has been doing well in rehab.  She is a good place mentally.  She does not claim to have any major stressors now.  Since her daughter was sick, she tries not to let stress get to her any more.  She has been sleeping well.  She  is enjoying coming to class and knows that it is good for her to get out and get in her exercise.       Expected Outcomes  Short: Continue to attend [name] regularly for regular exercise and social engagement. Long: Continue to improve symptoms and manage a positive mental state  Short: Continue to attend for getting out of house.  Long: Continue to stay postive.       Interventions  Encouraged to attend Pulmonary  Rehabilitation for the exercise  -      Continue Psychosocial Services   Follow up required by staff  -         Psychosocial Discharge (Final Psychosocial Re-Evaluation): Psychosocial Re-Evaluation - 05/06/18 1220      Psychosocial Re-Evaluation   Current issues with  Current Stress Concerns    Comments  Amandamarie has been doing well in rehab.  She is a good place mentally.  She does not claim to have any major stressors now.  Since her daughter was sick, she tries not to let stress get to her any more.  She has been sleeping well.  She is enjoying coming to class and knows that it is good for her to get out and get in her exercise.     Expected Outcomes  Short: Continue to attend for getting out of house.  Long: Continue to stay postive.        Education: Education Goals: Education classes will be provided on a weekly basis, covering required topics. Participant will state understanding/return demonstration of topics presented.  Learning Barriers/Preferences: Learning Barriers/Preferences - 02/16/18 1403      Learning Barriers/Preferences   Learning Barriers  None    Learning Preferences  Video       Education Topics:  Initial Evaluation Education: - Verbal, written and demonstration of respiratory meds, oximetry and breathing techniques. Instruction on use of nebulizers and MDIs and importance of monitoring MDI activations.   Pulmonary Rehab from 05/15/2018 in [facility] Cardiac and Pulmonary Rehab  Date  02/16/18  Educator  [facility]  Instruction Review Code  1- Verbalizes Understanding      General Nutrition Guidelines/Fats and Fiber: -Group instruction provided by verbal, written material, models and posters to present the general guidelines for heart healthy nutrition. Gives an explanation and review of dietary fats and fiber.   Pulmonary Rehab from 05/15/2018 in [facility] Cardiac and Pulmonary Rehab  Date  04/22/18  Educator  [facility]  Instruction Review Code  1- Verbalizes Understanding       Controlling Sodium/Reading Food Labels: -Group verbal and written material supporting the discussion of sodium use in heart healthy nutrition. Review and explanation with models, verbal and written materials for utilization of the food label.   Pulmonary Rehab from 05/15/2018 in [facility] Cardiac and Pulmonary Rehab  Date  04/29/18  Educator  [facility]  Instruction Review Code  1- Verbalizes Understanding      Exercise Physiology & General Exercise Guidelines: - Group verbal and written instruction with models to review the exercise physiology of the cardiovascular system and associated critical values. Provides general exercise guidelines with specific guidelines to those with heart or lung disease.    Pulmonary Rehab from 05/15/2018 in [facility] Cardiac and Pulmonary Rehab  Date  04/01/18  Educator  [facility]  Instruction Review Code  1- Verbalizes Understanding      Aerobic Exercise & Resistance Training: - Gives group verbal and written instruction on the various components of exercise. Focuses on aerobic and resistive training programs  and the benefits of this training and how to safely progress through these programs.   Pulmonary Rehab from 05/15/2018 in [facility] Cardiac and Pulmonary Rehab  Date  04/03/18  Educator  [facility]  Instruction Review Code  1- Verbalizes Understanding      Flexibility, Balance, Mind/Body Relaxation: Provides group verbal/written instruction on the benefits of flexibility and balance training, including mind/body exercise modes such as yoga, pilates and tai chi.  Demonstration and skill practice provided.   Pulmonary Rehab from 05/15/2018 in [facility] Cardiac and Pulmonary Rehab  Date  04/08/18  Educator  [facility]  Instruction Review Code  1- Verbalizes Understanding      Stress and Anxiety: - Provides group verbal and written instruction about the health risks of elevated stress and causes of high stress.  Discuss the correlation between heart/lung disease and anxiety and treatment options.  Review healthy ways to manage with stress and anxiety.   Pulmonary Rehab from 05/15/2018 in [facility] Cardiac and Pulmonary Rehab  Date  04/15/18  Educator  [facility]  Instruction Review Code  1- Verbalizes Understanding      Depression: - Provides group verbal and written instruction on the correlation between heart/lung disease and depressed mood, treatment options, and the stigmas associated with seeking treatment.   Pulmonary Rehab from 05/15/2018 in [facility] Cardiac and Pulmonary Rehab  Date  05/13/18  Educator  [facility]  Instruction Review Code  1- Verbalizes Understanding      Exercise & Equipment Safety: - Individual verbal instruction and demonstration of equipment use and safety with use of the equipment.   Pulmonary Rehab from 05/15/2018 in [facility] Cardiac and Pulmonary Rehab  Date  02/16/18  Educator  [facility]  Instruction Review Code  1- Verbalizes Understanding      Infection Prevention: - Provides verbal and written material to individual with discussion of infection control including proper hand washing and proper equipment cleaning during exercise session.   Pulmonary Rehab from 05/15/2018 in [facility] Cardiac and Pulmonary Rehab  Date  02/16/18  Educator  [facility]  Instruction Review Code  1- Verbalizes Understanding      Falls Prevention: - Provides verbal and written material to individual with discussion of falls prevention and safety.   Pulmonary Rehab from 05/15/2018 in [facility] Cardiac and Pulmonary Rehab  Date  02/16/18  Educator  [facility]  Instruction Review Code  1- Verbalizes Understanding      Diabetes: - Individual verbal and written instruction to review signs/symptoms of diabetes, desired ranges of glucose level fasting, after meals and with exercise. Advice that pre and post exercise glucose checks will be done for 3 sessions at entry of program.   Chronic Lung Diseases: - Group verbal and written instruction to review updates, respiratory medications, advancements in procedures and treatments.  Discuss use of supplemental oxygen including available portable oxygen systems, continuous and intermittent flow rates, concentrators, personal use and safety guidelines. Review proper use of inhaler and spacers. Provide informative websites for self-education.    Pulmonary Rehab from 05/15/2018 in [facility] Cardiac and Pulmonary Rehab  Date  04/17/18  Educator  [facility]  Instruction Review Code  1- Verbalizes Understanding      Energy Conservation: - Provide group verbal and written instruction for methods to conserve energy, plan and organize activities. Instruct on pacing techniques, use of adaptive equipment and posture/positioning to relieve shortness of breath.   Pulmonary Rehab from 05/15/2018 in [facility] Cardiac and Pulmonary Rehab  Date  05/15/18  Educator  [facility]  Instruction Review Code  1-  Verbalizes Understanding      Triggers and Exacerbations: - Group verbal and written instruction to review types of environmental triggers and ways to prevent exacerbations. Discuss weather changes, air quality and the benefits of nasal washing. Review warning signs and symptoms to help prevent infections. Discuss techniques for effective airway clearance, coughing, and vibrations.   AED/CPR: - Group verbal and written instruction with the use of models to demonstrate the basic use of the AED with the basic ABC's of resuscitation.   Pulmonary Rehab from 05/15/2018 in [facility] Cardiac and Pulmonary Rehab  Date  03/25/18  Educator  [facility]  Instruction Review Code  1- Actuary and Physiology of the Lungs: - Group verbal and written instruction with the use of models to provide basic lung anatomy and physiology related to function, structure and complications of lung disease.   Anatomy & Physiology of the Heart: - Group verbal and written instruction and models provide basic cardiac anatomy and physiology, with the coronary electrical and arterial systems. Review of Valvular disease and Heart  Failure   Pulmonary Rehab from 05/15/2018 in [facility] Cardiac and Pulmonary Rehab  Date  03/13/18  Educator  [facility]  Instruction Review Code  1- Verbalizes Understanding      Cardiac Medications: - Group verbal and written instruction to review commonly prescribed medications for heart disease. Reviews the medication, class of the drug, and side effects.   Pulmonary Rehab from 05/15/2018 in [facility] Cardiac and Pulmonary Rehab  Date  03/20/18  Educator  [facility]  Instruction Review Code  1- Verbalizes Understanding      Know Your Numbers and Risk Factors: -Group verbal and written instruction about important numbers in your health.  Discussion of what are risk factors and how they play a role in the disease process.  Review of Cholesterol, Blood Pressure, Diabetes, and BMI and the role they play in your overall health.   Pulmonary Rehab from 05/15/2018 in [facility] Cardiac and Pulmonary Rehab  Date  05/06/18  Educator  [facility]  Instruction Review Code  1- Verbalizes Understanding      Sleep Hygiene: -Provides group verbal and written instruction about how sleep can affect your health.  Define sleep hygiene, discuss sleep cycles and impact of sleep habits. Review good sleep hygiene tips.    Pulmonary Rehab from 05/15/2018 in [facility] Cardiac and Pulmonary Rehab  Date  03/18/18  Educator  [facility]  Instruction Review Code  1- Verbalizes Understanding      Other: -Provides group and verbal instruction on various topics (see comments)    Knowledge Questionnaire Score: Knowledge Questionnaire Score - 02/16/18 1401      Knowledge Questionnaire Score   Pre Score  13/18   Reviewed with patient       Core Components/Risk Factors/Patient Goals at Admission: Personal Goals and Risk Factors at Admission - 02/16/18 1427      Core Components/Risk Factors/Patient Goals on Admission    Weight Management  Yes;Weight Loss    Intervention  Weight Management: Develop a combined nutrition and exercise program designed to reach  desired caloric intake, while maintaining appropriate intake of nutrient and fiber, sodium and fats, and appropriate energy expenditure required for the weight goal.;Weight Management: Provide education and appropriate resources to help participant work on and attain dietary goals.    Admit Weight  132 lb (59.9 kg)    Goal Weight: Short Term  127 lb (57.6 kg)    Goal Weight: Long Term  120  lb (54.4 kg)    Expected Outcomes  Short Term: Continue to assess and modify interventions until short term weight is achieved;Long Term: Adherence to nutrition and physical activity/exercise program aimed toward attainment of established weight goal;Weight Loss: Understanding of general recommendations for a balanced deficit meal plan, which promotes 1-2 lb weight loss per week and includes a negative energy balance of [phone] kcal/d;Understanding recommendations for meals to include 15-35% energy as protein, 25-35% energy from fat, 35-60% energy from carbohydrates, less than 258m of dietary cholesterol, 20-35 gm of total fiber daily;Understanding of distribution of calorie intake throughout the day with the consumption of 4-5 meals/snacks    Improve shortness of breath with ADL's  Yes    Intervention  Provide education, individualized exercise plan and daily activity instruction to help decrease symptoms of SOB with activities of daily living.    Expected Outcomes  Short Term: Improve cardiorespiratory fitness to achieve a reduction of symptoms when performing ADLs;Long Term: Be able to perform more ADLs without symptoms or delay the onset of symptoms    Hypertension  Yes    Intervention  Provide education on lifestyle modifcations including regular physical activity/exercise, weight management, moderate sodium restriction and increased consumption of fresh fruit, vegetables, and low fat dairy, alcohol moderation, and smoking cessation.;Monitor prescription use compliance.    Expected Outcomes  Short Term: Continued  assessment and intervention until BP is < 140/985mHG in hypertensive participants. < 130/8075mG in hypertensive participants with diabetes, heart failure or chronic kidney disease.;Long Term: Maintenance of blood pressure at goal levels.       Core Components/Risk Factors/Patient Goals Review:  Goals and Risk Factor Review    Row Name 04/10/18 1239 05/06/18 1225           Core Components/Risk Factors/Patient Goals Review   Personal Goals Review  Weight Management/Obesity;Improve shortness of breath with ADL's;Hypertension  Weight Management/Obesity;Improve shortness of breath with ADL's;Hypertension      Review  IneJodies been doing well in rehab.  Her weight has been staying the same.  Her breathing is starting to improve.  She is doing well with her blood pressures and checks them daily at home. She is also monitoring her oxygen levels too.   IneEllagracentinues to well in rehab. Her weight has been up and down but has not noticed a pattern other than fluid some.  She is planning to take two fluid pills tomorrow to see if it will help.  She is doing better with her breathing and doing more around the house.   She continues to do well with her blood pressures.       Expected Outcomes  Short: Continue to work on weight loss.  Long: Continue to improve breathing.   Short: Continue to monitor weight closely.  Long: Continue to improve breathing.          Core Components/Risk Factors/Patient Goals at Discharge (Final Review):  Goals and Risk Factor Review - 05/06/18 1225      Core Components/Risk Factors/Patient Goals Review   Personal Goals Review  Weight Management/Obesity;Improve shortness of breath with ADL's;Hypertension    Review  IneSamarintinues to well in rehab. Her weight has been up and down but has not noticed a pattern other than fluid some.  She is planning to take two fluid pills tomorrow to see if it will help.  She is doing better with her breathing and doing more around the house.    She continues to do well with  her blood pressures.     Expected Outcomes  Short: Continue to monitor weight closely.  Long: Continue to improve breathing.        ITP Comments: ITP Comments    Row Name 11/25/17 1147 02/16/18 1359 02/23/18 0858 03/23/18 0854 04/20/18 0846   ITP Comments  Medical Evaluation completed. Chart sent for review and changes to Dr. Emily Filbert Director of [place]. Diagnosis can be found in [facility] encounter 08/21/17  Medical Evaluation completed. Chart sent for review and changes to Dr. Emily Filbert Director of [place]. Diagnosis can be found in [facility] encounter 08/21/17  30 day review completed. ITP sent to Dr. Emily Filbert Director of [place]. Continue with ITP unless changes are made by physician.  30 day review completed. ITP sent to Dr. Emily Filbert Director of [place]. Continue with ITP unless changes are made by physician.  30 day review completed. ITP sent to Dr. Emily Filbert Director of [place]. Continue with ITP unless changes are made by physician.   [place] Name 05/18/18 0827           ITP Comments  30 day review completed. ITP sent to Dr. Emily Filbert Director of [place]. Continue with ITP unless changes are made by physician.          Comments: 30 day review

## 2018-05-18 NOTE — Progress Notes (Signed)
Daily Session Note  Patient Details  Name: Ruth Roth MRN: [id number] Date of Birth: [birth year] Referring Provider:     Pulmonary Rehab from 02/16/2018 in [facility] Cardiac and Pulmonary Rehab  Referring Provider  Glori Bickers MD      Encounter Date: 05/18/2018  Check In: Session Check In - 05/18/18 1135      Check-In   Supervising physician immediately available to respond to emergencies  [name] immediately available ER MD    Physician(s)  Jimmye Norman and Quentin Cornwall    Location  [facility]-Cardiac & Pulmonary Rehab    Staff Present  Earlean Shawl, BS, ACSM CEP, Exercise Physiologist;Joseph Foy [place], [place], ACSM CEP, Exercise Physiologist    Medication changes reported      No    Fall or balance concerns reported     No    Warm-up and Cool-down  Performed as group-led instruction    Resistance Training Performed  Yes    VAD Patient?  No    PAD/SET Patient?  No      Pain Assessment   Currently in Pain?  No/denies    Multiple Pain Sites  No          Social History   Tobacco Use  Smoking Status Never Smoker  Smokeless Tobacco Never Used    Goals Met:  Proper associated with RPD/PD & O2 Sat Independence with exercise equipment Exercise tolerated well Strength training completed today  Goals Unmet:  Not Applicable  Comments: Pt able to follow exercise prescription today without complaint.  Will continue to monitor for progression.    Dr. Emily Filbert is Medical Director for [place] and [name].

## 2018-05-20 ENCOUNTER — Ambulatory Visit: Payer: Medicare Other

## 2018-05-20 ENCOUNTER — Other Ambulatory Visit: Payer: Self-pay

## 2018-05-20 ENCOUNTER — Encounter: Payer: Medicare Other | Admitting: *Deleted

## 2018-05-20 DIAGNOSIS — I5032 Chronic diastolic (congestive) heart failure: Secondary | ICD-10-CM | POA: Diagnosis not present

## 2018-05-20 NOTE — Progress Notes (Signed)
Daily Session Note  Patient Details  Name: Ruth Roth MRN: [id number] Date of Birth: [birth year] Referring Provider:     Pulmonary Rehab from 02/16/2018 in [facility] Cardiac and Pulmonary Rehab  Referring Provider  Glori Bickers MD      Encounter Date: 05/20/2018  Check In: Session Check In - 05/20/18 1120      Check-In   Supervising physician immediately available to respond to emergencies  See telemetry face sheet for immediately available ER MD    Physician(s)  Drs. Glena Norfolk    Location  [facility]-Cardiac & Pulmonary Rehab    Staff Present  Renita Papa, RN BSN;Minas Bonser Luan Pulling, [place], RCEP, CCRP, Exercise Physiologist;Joseph Tessie Fass RCP,RRT,BSRT    Medication changes reported      No    Fall or balance concerns reported     No    Warm-up and Cool-down  Performed as group-led instruction    Resistance Training Performed  Yes    VAD Patient?  No    PAD/SET Patient?  No      Pain Assessment   Currently in Pain?  No/denies          Social History   Tobacco Use  Smoking Status Never Smoker  Smokeless Tobacco Never Used    Goals Met:  Proper associated with RPD/PD & O2 Sat Independence with exercise equipment Using PLB without cueing & demonstrates good technique Exercise tolerated well No report of cardiac concerns or symptoms Strength training completed today  Goals Unmet:  Not Applicable  Comments: Pt able to follow exercise prescription today without complaint.  Will continue to monitor for progression.    Dr. Emily Filbert is Medical Director for [place] and [name].

## 2018-05-22 ENCOUNTER — Encounter: Payer: Self-pay | Admitting: Internal Medicine

## 2018-05-22 ENCOUNTER — Ambulatory Visit: Payer: Medicare Other

## 2018-05-22 ENCOUNTER — Ambulatory Visit (INDEPENDENT_AMBULATORY_CARE_PROVIDER_SITE_OTHER): Payer: Medicare Other | Admitting: Internal Medicine

## 2018-05-22 ENCOUNTER — Other Ambulatory Visit: Payer: Self-pay

## 2018-05-22 VITALS — Ht 61.5 in | Wt 137.0 lb

## 2018-05-22 DIAGNOSIS — R05 Cough: Secondary | ICD-10-CM | POA: Diagnosis not present

## 2018-05-22 DIAGNOSIS — I25118 Atherosclerotic heart disease of native coronary artery with other forms of angina pectoris: Secondary | ICD-10-CM | POA: Diagnosis not present

## 2018-05-22 DIAGNOSIS — K219 Gastro-esophageal reflux disease without esophagitis: Secondary | ICD-10-CM | POA: Diagnosis not present

## 2018-05-22 DIAGNOSIS — I5032 Chronic diastolic (congestive) heart failure: Secondary | ICD-10-CM

## 2018-05-22 DIAGNOSIS — K754 Autoimmune hepatitis: Secondary | ICD-10-CM

## 2018-05-22 DIAGNOSIS — R059 Cough, unspecified: Secondary | ICD-10-CM

## 2018-05-22 DIAGNOSIS — I4891 Unspecified atrial fibrillation: Secondary | ICD-10-CM

## 2018-05-22 DIAGNOSIS — E039 Hypothyroidism, unspecified: Secondary | ICD-10-CM

## 2018-05-22 DIAGNOSIS — I10 Essential (primary) hypertension: Secondary | ICD-10-CM

## 2018-05-22 DIAGNOSIS — D509 Iron deficiency anemia, unspecified: Secondary | ICD-10-CM | POA: Diagnosis not present

## 2018-05-22 DIAGNOSIS — K743 Primary biliary cirrhosis: Secondary | ICD-10-CM

## 2018-05-22 DIAGNOSIS — E7800 Pure hypercholesterolemia, unspecified: Secondary | ICD-10-CM

## 2018-05-22 MED ORDER — BENZONATATE 100 MG PO CAPS: 100.0000 mg | ORAL_CAPSULE | Freq: Three times a day (TID) | ORAL | 0 refills | Status: DC | PRN

## 2018-05-22 NOTE — Progress Notes (Signed)
[name], female   DOB: September 29, 1928, 85 y.o.   MRN: [id number]   Subjective:    [name], female    DOB: May 09, 1928, 85 y.o.   MRN: [id number]  HPI  Patient here for a scheduled follow up.  She reports she is doing relatively well.  Increased stress.  Still trying to cope with her daughter's death.  Her granddaughter has recently been diagnosed with colon cancer.  Discussed with her today.  Overall she feels she is handling things relatively well.  Does not feel she needs any further intervention. Some increased congestion and drainage.  Some cough.  No deep chest congestoin.  No sob.  No chest pain.  Using flonase.  No acid reflux.  No abdominal pain.  Bowels moving.  Has f/u scheduled with cardiology next week.  Receiving IV iron.  Being followed by hematology.    Past Medical History:  Diagnosis Date  . (HFpEF) heart failure with preserved ejection fraction ([place]    a. 07/2017 Echo: EF 50-55%, no rwma, mild AS/AI, mod MR, mod dil LA, mod TR, PASP 74mmHg.  . Autoimmune hepatitis ([place])    followed by Dr [name]  . Fibrocystic breast disease   . Glaucoma   . Hypercholesterolemia   . Hypertension   . Hypothyroidism   . Inflammatory arthritis   . MI (myocardial infarction) ([place])   . Neuropathy   . Non-obstructive Coronary artery disease    a. 05/2014 NSTEMI/Cath: LM nl, LAD mild dzs, LCX 60ost hazy (? culprit), RCA mild dzs. EF 60% by echo-->Med Rx; b. 08/2017 Cath: LM 30ost, LAD min irregs, LCX small, 40ost/p, RCA large, min irregs, RPDA/RPL1 nl, EF 50-55%. 4+MR.  . Osteoarthritis   . Permanent atrial fibrillation    a. CHA2DS2VASc = 6-->Xarelto.  . 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 Severe mitral regurgitation    a. 07/2017 Echo: Mod MR; b. 08/2017 Cath: 4+/Severe MR.   Past Surgical History:  Procedure Laterality Date  . ABDOMINAL HYSTERECTOMY  1963   partial, secondary to fibroids  .  APPENDECTOMY    . Billroth    . CARDIAC CATHETERIZATION  05/12/14   [facility]  . CARDIOVERSION N/A 12/26/2016   Procedure: CARDIOVERSION;  Surgeon: [name], MD;  Location: [facility] ORS;  Service: Cardiovascular;  Laterality: N/A;  . CARDIOVERSION N/A 05/16/2017   Procedure: CARDIOVERSION;  Surgeon: [name], MD;  Location: [facility] ORS;  Service: Cardiovascular;  Laterality: N/A;  . CARDIOVERSION N/A 06/09/2017   Procedure: CARDIOVERSION;  Surgeon: [name], MD;  Location: [facility] ORS;  Service: Cardiovascular;  Laterality: N/A;  . CHOLECYSTECTOMY  1998  . COLONOSCOPY  2009  . RIGHT/LEFT HEART CATH AND CORONARY ANGIOGRAPHY Bilateral 08/21/2017   Procedure: RIGHT/LEFT HEART CATH AND CORONARY ANGIOGRAPHY;  Surgeon: [name], MD;  Location: [facility] CV LAB;  Service: Cardiovascular;  Laterality: Bilateral;  . TEE WITHOUT CARDIOVERSION N/A 08/25/2017   Procedure: TRANSESOPHAGEAL ECHOCARDIOGRAM (TEE);  Surgeon: Jolaine Artist, MD;  Location: [facility] ENDOSCOPY;  Service: Cardiovascular;  Laterality: N/A;  . UPPER GI ENDOSCOPY  2004   Family History  Problem Relation Age of Onset  . Stroke Mother   . Esophageal cancer Brother        also had lung cancer  . Ovarian cancer Daughter   . Colon cancer Daughter   . Breast cancer Neg Hx    Social History  Socioeconomic History  . Marital status: Widowed    Spouse name: Not on file  . Number of children: Not on file  . Years of education: Not on file  . Highest education level: Not on file  Occupational History  . Not on file  Social Needs  . Financial resource strain: Not hard at all  . Food insecurity:    Worry: Never true    Inability: Never true  . Transportation needs:    Medical: No    Non-medical: No  Tobacco Use  . Smoking status: Never Smoker  . Smokeless tobacco: Never Used  Substance and Sexual Activity  . Alcohol use: No    Alcohol/week: 0.0 standard drinks  . Drug use: No  . Sexual activity: Never   Lifestyle  . Physical activity:    Days per week: 3 days    Minutes per session: 40 min  . Stress: Not at all  Relationships  . Social connections:    Talks on phone: Not on file    Gets together: Not on file    Attends religious service: Not on file    Active member of club or organization: Not on file    Attends meetings of clubs or organizations: Not on file    Relationship status: Not on file  Other Topics Concern  . Not on file  Social History Narrative  . Not on file    Outpatient Encounter Medications as of 05/22/2018  Medication Sig  . amoxicillin (AMOXIL) 875 MG tablet   . brimonidine-timolol (COMBIGAN) 0.2-0.5 % ophthalmic solution Place 1 drop into the left eye 2 (two) times daily.   . chlorhexidine (PERIDEX) 0.12 % solution SWISH 1 CAPFUL FOR 30 SECONDS THEN SPIT TWICE A DAY, START ONE WEEK PRIOR TO SURGERY.  . dorzolamide (TRUSOPT) 2 % ophthalmic solution Place 1 drop into the left eye 2 (two) times daily.  . fluticasone (FLONASE) 50 MCG/ACT nasal spray Place 2 sprays into both nostrils daily as needed for allergies.   Marland Kitchen gabapentin (NEURONTIN) 300 MG capsule TAKE 2 CAPSULES BY MOUTH FOUR TIMES DAILY  . ipratropium (ATROVENT) 0.06 % nasal spray Place 2 sprays into both nostrils daily as needed for rhinitis.   Marland Kitchen levothyroxine (SYNTHROID, LEVOTHROID) 125 MCG tablet TAKE 1 TABLET(125 MCG) BY MOUTH DAILY BEFORE BREAKFAST  . metoprolol tartrate (LOPRESSOR) 50 MG tablet TAKE 1 TABLET(50 MG) BY MOUTH TWICE DAILY  . pantoprazole (PROTONIX) 40 MG tablet Take 1 tablet (40 mg total) by mouth every evening.  . Rivaroxaban (XARELTO) 15 MG TABS tablet Take 1 tablet (15 mg total) by mouth daily with supper.  . torsemide (DEMADEX) 20 MG tablet Take 1 tablet (20 mg total) by mouth daily.  . traMADol (ULTRAM) 50 MG tablet Take 1 tablet (50 mg total) by mouth daily as needed (for pain.).  Marland Kitchen ursodiol (ACTIGALL) 300 MG capsule Take 300-600 mg by mouth See admin instructions. Take 2 capsules  (600 mg) daily after lunch & 1 capsule (300 mg) at night.  . benzonatate (TESSALON) 100 MG capsule Take 1 capsule (100 mg total) by mouth 3 (three) times daily as needed for cough.  . [DISCONTINUED] ranitidine (ZANTAC) 150 MG tablet TAKE 1 TABLET(150 MG) BY MOUTH DAILY 30 MINUTES BEFORE BREAKFAST (Patient not taking: Reported on 05/05/2018)   No facility-administered encounter medications on file as of 05/22/2018.     Review of Systems  Constitutional: Negative for appetite change and unexpected weight change.  HENT: Positive for congestion and postnasal drip.  Respiratory: Positive for cough. Negative for chest tightness and shortness of breath.   Cardiovascular: Negative for chest pain, palpitations and leg swelling.  Gastrointestinal: Negative for abdominal pain, diarrhea, nausea and vomiting.  Genitourinary: Negative for difficulty urinating and dysuria.  Musculoskeletal: Negative for joint swelling and myalgias.  Skin: Negative for color change and rash.  Neurological: Negative for dizziness, light-headedness and headaches.  Psychiatric/Behavioral: Negative for agitation and dysphoric mood.       Increased stress as outlined.         Objective:    Physical Exam Constitutional:      General: She is not in acute distress.    Appearance: Normal appearance.  HENT:     Nose: Nose normal. No congestion.     Mouth/Throat:     Pharynx: No oropharyngeal exudate or posterior oropharyngeal erythema.  Neck:     Musculoskeletal: Neck supple. No muscular tenderness.     Thyroid: No thyromegaly.  Cardiovascular:     Rate and Rhythm: Normal rate and regular rhythm.  Pulmonary:     Effort: No respiratory distress.     Breath sounds: Normal breath sounds. No wheezing.  Abdominal:     General: Bowel sounds are normal.     Palpations: Abdomen is soft.     Tenderness: There is no abdominal tenderness.  Musculoskeletal:        General: No swelling or tenderness.  Lymphadenopathy:      Cervical: No cervical adenopathy.  Skin:    Findings: No erythema or rash.  Neurological:     Mental Status: She is alert.  Psychiatric:        Mood and Affect: Mood normal.        Behavior: Behavior normal.     BP 120/64 (BP Location: Left Arm, Patient Position: Sitting, Cuff Size: Normal)   Pulse 67   Temp 98.3 F (36.8 C) (Oral)   Resp 15   Ht 5' 1.5" (1.562 m)   Wt 136 lb 12.8 oz (62.1 kg)   SpO2 95%   BMI 25.43 kg/m  Wt Readings from Last 3 Encounters:  05/22/18 136 lb 12.8 oz (62.1 kg)  05/22/18 137 lb (62.1 kg)  05/05/18 140 lb 14.4 oz (63.9 kg)     Lab Results  Component Value Date   WBC 6.2 05/05/2018   HGB 11.4 (L) 05/05/2018   HCT 36.0 05/05/2018   PLT 206 05/05/2018   GLUCOSE 96 02/25/2018   CHOL 144 01/27/2018   TRIG 107.0 01/27/2018   HDL 44.10 01/27/2018   LDLDIRECT 143.5 12/17/2012   LDLCALC 78 01/27/2018   ALT 14 01/27/2018   AST 14 01/27/2018   NA 138 02/25/2018   K 4.7 02/25/2018   CL 103 02/25/2018   CREATININE 1.35 (H) 02/25/2018   BUN 42 (H) 02/25/2018   CO2 27 02/25/2018   TSH 2.26 01/27/2018   INR 1.63 12/29/2017   HGBA1C 5.6 01/27/2018       Assessment & Plan:   Problem List Items Addressed This Visit    Anemia    Has iron deficient anemia.  Being followed by hematology.  IV iron.  Follow.        Atrial fibrillation ([place])    On xarelto.  Followed by cardiology.  Stable.       Autoimmune hepatitis ([place])    Followed by GI.  Liver function tests are wnl.       Relevant Orders   Hepatic function panel   Chronic diastolic heart failure (  [place])    No evidence of volume overload today.  Followed by cardiology.  Continue current medication regimen.  Stable.       Coronary artery disease    Stable.  Followed by cardiology.       Cough    Increased cough and congestion.  Continue flonase and saline nasal spray as directed.  Robitussin DM.  rx given for tessalon perles to have if needed.  Follow.        GERD  (gastroesophageal reflux disease)    Controlled on current regimen.  Follow.       Hypercholesterolemia    Unable to take statin medication.  Low cholesterol diet and exercise.  Follow lipid panel.       Relevant Orders   Lipid panel   Hypertension    Blood pressure under good control.  Continue same medication regimen.  Follow pressures.  Follow metabolic panel.        Relevant Orders   Basic metabolic panel   Hypothyroidism    On thyroid replacement.  Follow tsh.       Primary biliary cholangitis ([place])    Being followed by GI.           Einar Pheasant, MD

## 2018-05-22 NOTE — Progress Notes (Signed)
Daily Session Note  Patient Details  Name: Ruth Roth MRN: [id number] Date of Birth: [birth year] Referring Provider:     Pulmonary Rehab from 02/16/2018 in [facility] Cardiac and Pulmonary Rehab  Referring Provider  Glori Bickers MD      Encounter Date: 05/22/2018  Check In: Session Check In - 05/22/18 1137      Check-In   Supervising physician immediately available to respond to emergencies  [name] immediately available ER MD    Physician(s)  Dr. Burlene Arnt and Corky Downs    Location  [facility]-Cardiac & Pulmonary Rehab    Staff Present  Justin Mend RCP,RRT,BSRT;Jessica Luan [place], MA, RCEP, CCRP, Exercise Physiologist;Krista Frederico Hamman, RN BSN    Medication changes reported      No    Fall or balance concerns reported     No    Warm-up and Cool-down  Performed as group-led Higher education careers adviser Performed  Yes    VAD Patient?  No    PAD/SET Patient?  No      Pain Assessment   Currently in Pain?  No/denies          Social History   Tobacco Use  Smoking Status Never Smoker  Smokeless Tobacco Never Used    Goals Met:  Independence with exercise equipment Exercise tolerated well No report of cardiac concerns or symptoms Strength training completed today  Goals Unmet:  Not Applicable  Comments: Pt able to follow exercise prescription today without complaint.  Will continue to monitor for progression.  [place] Name 02/16/18 1442 05/22/18 1205       6 Minute Walk   Phase  Initial  Discharge    Distance  830 feet  1130 feet    Distance % Change  -  36.1 %    Distance Feet Change  -  300 ft    Walk Time  4.55 minutes  6 minutes    # of Rest Breaks  1 1:27  0    MPH  2.07  2.14    METS  1.05  1.81    RPE  11  11    Perceived Dyspnea   0  1    VO2 Peak  3.67  6.33    Symptoms  Yes (comment)  Yes (comment)    Comments  leg fatigued/painful from hips down back of both legs  SOB, leg fatigued    Resting HR  64 bpm  58 bpm    Resting BP  128/72   120/62    Resting Oxygen Saturation   96 %  93 %    Exercise Oxygen Saturation  during 6 min walk  93 %  89 %    Max Ex. HR  88 bpm  99 bpm    Max Ex. BP  132/72  146/74    2 Minute Post BP  128/70  128/72      Interval HR   1 Minute HR  78  71    2 Minute HR  84  81    3 Minute HR  87  92    4 Minute HR  81  96    5 Minute HR  75  95    6 Minute HR  88  99    2 Minute Post HR  67  83    Interval Heart Rate?  Yes  Yes      Interval Oxygen   Interval Oxygen?  Yes  Yes    Baseline Oxygen Saturation %  96 %  93 %    1 Minute Oxygen Saturation %  93 %  89 %    1 Minute Liters of Oxygen  0 L Room Air  0 L Room Air    2 Minute Oxygen Saturation %  93 %  90 %    2 Minute Liters of Oxygen  0 L  0 L    3 Minute Oxygen Saturation %  94 % rest 3:33-5:00  89 %    3 Minute Liters of Oxygen  0 L  0 L    4 Minute Oxygen Saturation %  94 %  91 %    4 Minute Liters of Oxygen  0 L  0 L    5 Minute Oxygen Saturation %  94 %  89 %    5 Minute Liters of Oxygen  0 L  0 L    6 Minute Oxygen Saturation %  96 %  90 %    6 Minute Liters of Oxygen  0 L  0 L    2 Minute Post Oxygen Saturation %  96 %  93 %    2 Minute Post Liters of Oxygen  0 L  0 L         Dr. Emily Filbert is Medical Director for [place] and [name].

## 2018-05-22 NOTE — Patient Instructions (Signed)
Saline nasal spray - flush nose at least 2-3x/day  Continue flonase nasal spray as you are doing.   Robitussin DM twice a day

## 2018-05-24 ENCOUNTER — Encounter: Payer: Self-pay | Admitting: Internal Medicine

## 2018-05-24 DIAGNOSIS — R059 Cough, unspecified: Secondary | ICD-10-CM | POA: Insufficient documentation

## 2018-05-24 DIAGNOSIS — R05 Cough: Secondary | ICD-10-CM | POA: Insufficient documentation

## 2018-05-24 DIAGNOSIS — K743 Primary biliary cirrhosis: Secondary | ICD-10-CM | POA: Insufficient documentation

## 2018-05-24 NOTE — Assessment & Plan Note (Signed)
On xarelto.  Followed by cardiology.  Stable.

## 2018-05-24 NOTE — Assessment & Plan Note (Signed)
On thyroid replacement.  Follow tsh.  

## 2018-05-24 NOTE — Assessment & Plan Note (Signed)
Controlled on current regimen.  Follow.  

## 2018-05-24 NOTE — Assessment & Plan Note (Signed)
Blood pressure under good control.  Continue same medication regimen.  Follow pressures.  Follow metabolic panel.   

## 2018-05-24 NOTE — Assessment & Plan Note (Signed)
Increased cough and congestion.  Continue flonase and saline nasal spray as directed.  Robitussin DM.  rx given for tessalon perles to have if needed.  Follow.

## 2018-05-24 NOTE — Assessment & Plan Note (Signed)
Followed by GI.  Liver function tests are wnl.

## 2018-05-24 NOTE — Assessment & Plan Note (Signed)
Has iron deficient anemia.  Being followed by hematology.  IV iron.  Follow.

## 2018-05-24 NOTE — Assessment & Plan Note (Signed)
Stable.  Followed by cardiology.   

## 2018-05-24 NOTE — Assessment & Plan Note (Signed)
Being followed by GI 

## 2018-05-24 NOTE — Assessment & Plan Note (Signed)
Unable to take statin medication.  Low cholesterol diet and exercise.  Follow lipid panel.

## 2018-05-24 NOTE — Assessment & Plan Note (Signed)
No evidence of volume overload today.  Followed by cardiology.  Continue current medication regimen.  Stable.

## 2018-05-25 ENCOUNTER — Ambulatory Visit: Payer: Medicare Other

## 2018-05-26 ENCOUNTER — Telehealth (HOSPITAL_COMMUNITY): Payer: Self-pay | Admitting: *Deleted

## 2018-05-26 NOTE — Telephone Encounter (Signed)
Left VM for pt to call back. Need to r/s appt if patient is stable due to covid 19.

## 2018-05-27 ENCOUNTER — Encounter (HOSPITAL_COMMUNITY): Payer: Medicare Other | Admitting: Internal Medicine

## 2018-05-27 ENCOUNTER — Ambulatory Visit: Payer: Medicare Other

## 2018-05-29 ENCOUNTER — Ambulatory Visit: Payer: Medicare Other

## 2018-06-01 ENCOUNTER — Ambulatory Visit: Payer: Medicare Other

## 2018-06-01 ENCOUNTER — Other Ambulatory Visit: Payer: Self-pay | Admitting: Internal Medicine

## 2018-06-01 DIAGNOSIS — I5032 Chronic diastolic (congestive) heart failure: Secondary | ICD-10-CM

## 2018-06-01 DIAGNOSIS — Z1231 Encounter for screening mammogram for malignant neoplasm of breast: Secondary | ICD-10-CM

## 2018-06-03 ENCOUNTER — Ambulatory Visit: Payer: Medicare Other

## 2018-06-04 ENCOUNTER — Other Ambulatory Visit: Payer: Self-pay | Admitting: Internal Medicine

## 2018-06-09 ENCOUNTER — Other Ambulatory Visit: Payer: Self-pay | Admitting: Internal Medicine

## 2018-06-15 ENCOUNTER — Encounter: Payer: Self-pay | Admitting: *Deleted

## 2018-06-15 DIAGNOSIS — I5032 Chronic diastolic (congestive) heart failure: Secondary | ICD-10-CM

## 2018-06-15 NOTE — Progress Notes (Signed)
Pulmonary Individual Treatment Plan  Patient Details  Name: Ruth Roth MRN: [id number] Date of Birth: [birth year] Referring Provider:     Pulmonary Rehab from 02/16/2018 in [facility]  Referring Provider  Glori Bickers MD      Initial Encounter Date:    Pulmonary Rehab from 02/16/2018 in [facility]  Date  02/16/18      Visit Diagnosis: Chronic diastolic heart failure ([place])  Patient's Home Medications on Admission:  Current Outpatient Medications:  .  amoxicillin (AMOXIL) 875 MG tablet, , Disp: , Rfl:  .  benzonatate (TESSALON) 100 MG capsule, Take 1 capsule (100 mg total) by mouth 3 (three) times daily as needed for cough., Disp: 21 capsule, Rfl: 0 .  brimonidine-timolol (COMBIGAN) 0.2-0.5 % ophthalmic solution, Place 1 drop into the left eye 2 (two) times daily. , Disp: , Rfl:  .  chlorhexidine (PERIDEX) 0.12 % solution, SWISH 1 CAPFUL FOR 30 SECONDS THEN SPIT TWICE A DAY, START ONE WEEK PRIOR TO SURGERY., Disp: , Rfl:  .  dorzolamide (TRUSOPT) 2 % ophthalmic solution, Place 1 drop into the left eye 2 (two) times daily., Disp: , Rfl:  .  fluticasone (FLONASE) 50 MCG/ACT nasal spray, Place 2 sprays into both nostrils daily as needed for allergies. , Disp: , Rfl:  .  gabapentin (NEURONTIN) 300 MG capsule, TAKE 2 CAPSULES BY MOUTH FOUR TIMES DAILY, Disp: 720 capsule, Rfl: 0 .  ipratropium (ATROVENT) 0.06 % nasal spray, Place 2 sprays into both nostrils daily as needed for rhinitis. , Disp: , Rfl:  .  levothyroxine (SYNTHROID, LEVOTHROID) 125 MCG tablet, TAKE 1 TABLET BY MOUTH DAILY BEFORE BREAKFAST, Disp: 90 tablet, Rfl: 0 .  metoprolol tartrate (LOPRESSOR) 50 MG tablet, TAKE 1 TABLET(50 MG) BY MOUTH TWICE DAILY, Disp: 180 tablet, Rfl: 3 .  pantoprazole (PROTONIX) 40 MG tablet, Take 1 tablet (40 mg total) by mouth every evening., Disp: 90 tablet, Rfl: 3 .  Rivaroxaban (XARELTO) 15 MG TABS tablet, Take 1 tablet (15 mg total) by mouth daily  with supper., Disp: 30 tablet, Rfl: 6 .  torsemide (DEMADEX) 20 MG tablet, Take 1 tablet (20 mg total) by mouth daily., Disp: 30 tablet, Rfl: 3 .  traMADol (ULTRAM) 50 MG tablet, Take 1 tablet (50 mg total) by mouth daily as needed (for pain.)., Disp: 30 tablet, Rfl: 1 .  ursodiol (ACTIGALL) 300 MG capsule, Take 300-600 mg by mouth See admin instructions. Take 2 capsules (600 mg) daily after lunch & 1 capsule (300 mg) at night., Disp: , Rfl:   Past Medical History: Past Medical History:  Diagnosis Date  . (HFpEF) heart failure with preserved ejection fraction ([place])    a. 07/2017 Echo: EF 50-55%, no rwma, mild AS/AI, mod MR, mod dil LA, mod TR, PASP 19mHg.  . Autoimmune hepatitis ([facility]    followed by Dr SGustavo Lah . Fibrocystic breast disease   . Glaucoma   . Hypercholesterolemia   . Hypertension   . Hypothyroidism   . Inflammatory arthritis   . [place]   . Neuropathy   . Non-obstructive Coronary artery disease    a. 05/2014 NSTEMI/Cath: LM nl, LAD mild dzs, LCX 60ost hazy (? culprit), RCA mild dzs. EF 60% by echo-->Med Rx; b. 08/2017 Cath: LM 30ost, LAD min irregs, LCX small, 40ost/p, RCA large, min irregs, RPDA/RPL1 nl, EF 50-55%. 4+MR.  . Osteoarthritis   . Permanent atrial fibrillation    a. CHA2DS2VASc = 6-->Xarelto.  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 Severe mitral regurgitation    a. 07/2017 Echo: Mod MR; b. 08/2017 Cath: 4+/Severe MR.    Tobacco Use: Social History   Tobacco Use  Smoking Status Never Smoker  Smokeless Tobacco Never Used    Labs: Recent Review Flowsheet Data    Labs for ITP Cardiac and Pulmonary Rehab Latest Ref Rng & Units 03/20/2016 07/29/2016 01/17/2017 05/27/2017 01/27/2018   Cholestrol 0 - 200 mg/dL 230(H) 215(H) 199 159 144   LDLCALC 0 - 99 mg/dL 140(H) 125(H) 110(H) 74 78   LDLDIRECT mg/dL - - - - -   HDL >39.00 mg/dL 67.30 54.20 67.30 64.30 44.10   Trlycerides 0.0  - 149.0 mg/dL 113.0 178.0(H) 107.0 101.0 107.0   Hemoglobin A1c 4.6 - 6.5 % 5.9 6.0 5.8 5.7 5.6   TCO2 0 - 100 mmol/L - - - - -       Pulmonary Assessment Scores: Pulmonary Assessment Scores    Row Name 02/16/18 1359 05/22/18 1138 05/22/18 1208     ADL UCSD   ADL Phase  Entry  Exit  Exit   SOB Score total  57  8  -   Rest  0  0  -   Walk  2  0  -   Stairs  5  4  -   Bath  4  0  -   Dress  5  0  -   Shop  4  0  -     CAT Score   CAT Score  22  11  -     mMRC Score   mMRC Score  0  -  0      Pulmonary Function Assessment: Pulmonary Function Assessment - 02/16/18 1425      Breath   Bilateral Breath Sounds  Clear    Shortness of Breath  Limiting activity;No       Exercise Target Goals: Exercise Program Goal: Individual exercise prescription set using results from initial 6 min walk test and THRR while considering  patient's activity barriers and safety.   Exercise Prescription Goal: Initial exercise prescription builds to 30-45 minutes a day of aerobic activity, 2-3 days per week.  Home exercise guidelines will be given to patient during program as part of exercise prescription that the participant will acknowledge.  Activity Barriers & Risk Stratification: Activity Barriers & Cardiac Risk Stratification - 02/16/18 1444      Activity Barriers & Cardiac Risk Stratification   Activity Barriers  Deconditioning;Muscular Weakness;Balance Concerns;History of Falls;Other (comment);Decreased Ventricular Function    Comments  bilateral leg pain when walking       6 Minute Walk: 6 Minute Walk    Row Name 02/16/18 1442 05/22/18 1205       6 Minute Walk   Phase  Initial  Discharge    Distance  830 feet  1130 feet    Distance % Change  -  36.1 %    Distance Feet Change  -  300 ft    Walk Time  4.55 minutes  6 minutes    # of Rest Breaks  1 1:27  0    MPH  2.07  2.14    METS  1.05  1.81    RPE  11  11    Perceived Dyspnea   0  1    VO2 Peak  3.67  6.33    Symptoms  Yes (comment)  Yes (comment)    Comments  leg fatigued/painful from hips down back of both legs  SOB, leg fatigued    Resting HR  64 bpm  58 bpm    Resting BP  128/72  120/62    Resting Oxygen Saturation   96 %  93 %    Exercise Oxygen Saturation  during 6 min walk  93 %  89 %    Max Ex. HR  88 bpm  99 bpm    Max Ex. BP  132/72  146/74    2 Minute Post BP  128/70  128/72      Interval HR   1 Minute HR  78  71    2 Minute HR  84  81    3 Minute HR  87  92    4 Minute HR  81  96    5 Minute HR  75  95    6 Minute HR  88  99    2 Minute Post HR  67  83    Interval Heart Rate?  Yes  Yes      Interval Oxygen   Interval Oxygen?  Yes  Yes    Baseline Oxygen Saturation %  96 %  93 %    1 Minute Oxygen Saturation %  93 %  89 %    1 Minute Liters of Oxygen  0 L Room Air  0 L Room Air    2 Minute Oxygen Saturation %  93 %  90 %    2 Minute Liters of Oxygen  0 L  0 L    3 Minute Oxygen Saturation %  94 % rest 3:33-5:00  89 %    3 Minute Liters of Oxygen  0 L  0 L    4 Minute Oxygen Saturation %  94 %  91 %    4 Minute Liters of Oxygen  0 L  0 L    5 Minute Oxygen Saturation %  94 %  89 %    5 Minute Liters of Oxygen  0 L  0 L    6 Minute Oxygen Saturation %  96 %  90 %    6 Minute Liters of Oxygen  0 L  0 L    2 Minute Post Oxygen Saturation %  96 %  93 %    2 Minute Post Liters of Oxygen  0 L  0 L      Oxygen Initial Assessment: Oxygen Initial Assessment - 02/16/18 1423      Home Oxygen   Home Oxygen Device  None    Sleep Oxygen Prescription  None    Home Exercise Oxygen Prescription  None    Home at Rest Exercise Oxygen Prescription  None      Initial 6 min Walk   Oxygen Used  None      Program Oxygen Prescription   Program Oxygen Prescription  None      Intervention   Short Term Goals  To learn and understand importance of maintaining oxygen saturations>88%;To learn and demonstrate proper pursed lip breathing techniques or other breathing techniques.;To learn and  understand importance of monitoring SPO2 with pulse oximeter and demonstrate accurate use of the pulse oximeter.    Long  Term Goals  Verbalizes importance of monitoring SPO2 with pulse oximeter and return demonstration;Maintenance of O2 saturations>88%;Exhibits proper breathing techniques, such as pursed lip breathing or other method taught during program session  Oxygen Re-Evaluation: Oxygen Re-Evaluation    Row Name 03/09/18 1143 03/27/18 1155 05/06/18 1228 06/04/18 1044       Program Oxygen Prescription   Program Oxygen Prescription  -  None  None  None      Home Oxygen   Home Oxygen Device  -  None  None  None    Sleep Oxygen Prescription  -  None  None  None    Home Exercise Oxygen Prescription  -  None  None  None    Home at Rest Exercise Oxygen Prescription  -  None  None  None      Goals/Expected Outcomes   Short Term Goals  -  To learn and understand importance of maintaining oxygen saturations>88%;To learn and demonstrate proper pursed lip breathing techniques or other breathing techniques.;To learn and understand importance of monitoring SPO2 with pulse oximeter and demonstrate accurate use of the pulse oximeter.  To learn and understand importance of maintaining oxygen saturations>88%;To learn and demonstrate proper pursed lip breathing techniques or other breathing techniques.;To learn and understand importance of monitoring SPO2 with pulse oximeter and demonstrate accurate use of the pulse oximeter.  To learn and understand importance of maintaining oxygen saturations>88%;To learn and demonstrate proper pursed lip breathing techniques or other breathing techniques.;To learn and understand importance of monitoring SPO2 with pulse oximeter and demonstrate accurate use of the pulse oximeter.    Long  Term Goals  -  Verbalizes importance of monitoring SPO2 with pulse oximeter and return demonstration;Maintenance of O2 saturations>88%;Exhibits proper breathing techniques, such  as pursed lip breathing or other method taught during program session  Verbalizes importance of monitoring SPO2 with pulse oximeter and return demonstration;Maintenance of O2 saturations>88%;Exhibits proper breathing techniques, such as pursed lip breathing or other method taught during program session  Verbalizes importance of monitoring SPO2 with pulse oximeter and return demonstration;Maintenance of O2 saturations>88%;Exhibits proper breathing techniques, such as pursed lip breathing or other method taught during program session    Comments  Reviewed PLB technique with pt.  Talked about how it work and it's important to maintaining his exercise saturations.  Patient is using her PLB and is checking her oxygen at home. She checks her oxygen once a day. She was not exercising at all before she came to rehab. Now she is able to do more and is breathing a little easier.  Ruth Roth has continued to use her PLB at home.  She noticed that her pulse oximeter needs a new battery but she is struggling to get it done.  She is going to try to bring it in to have [place] help her get in fixed.    Ruth Roth has been doing well.  She continues to ues her PLB and finds it helpful. She still hasn't changed the battery in her pulse oximeter, but it works part time.  She will continue to check it.      Goals/Expected Outcomes  Short: Become more profiecient at using PLB.   Long: Become independent at using PLB.  Short: check oxygen and heart rate more routinely at home. Long: maintain checking oxygen independently.  Short: Bring her pulse oximeter in to change battery. Long: Continue to use PLB.   Short: Get battery changed in pulse oximeter  Long: Continue to use PLB to help with breathing.        Oxygen Discharge (Final Oxygen Re-Evaluation): Oxygen Re-Evaluation - 06/04/18 1044      Program Oxygen Prescription   Program Oxygen Prescription  None  Home Oxygen   Home Oxygen Device  None    Sleep Oxygen Prescription  None     Home Exercise Oxygen Prescription  None    Home at Rest Exercise Oxygen Prescription  None      Goals/Expected Outcomes   Short Term Goals  To learn and understand importance of maintaining oxygen saturations>88%;To learn and demonstrate proper pursed lip breathing techniques or other breathing techniques.;To learn and understand importance of monitoring SPO2 with pulse oximeter and demonstrate accurate use of the pulse oximeter.    Long  Term Goals  Verbalizes importance of monitoring SPO2 with pulse oximeter and return demonstration;Maintenance of O2 saturations>88%;Exhibits proper breathing techniques, such as pursed lip breathing or other method taught during program session    Comments  Ruth Roth has been doing well.  She continues to ues her PLB and finds it helpful. She still hasn't changed the battery in her pulse oximeter, but it works part time.  She will continue to check it.      Goals/Expected Outcomes  Short: Get battery changed in pulse oximeter  Long: Continue to use PLB to help with breathing.        Initial Exercise Prescription: Initial Exercise Prescription - 02/16/18 1400      Date of Initial Exercise RX and Referring Provider   Date  02/16/18    Referring Provider  Glori Bickers MD      Treadmill   MPH  1.7    Grade  0.5    Minutes  15    METs  2.42      Recumbant Bike   Level  1    RPM  50    Minutes  15    METs  1.5      NuStep   Level  1    SPM  80    Minutes  15    METs  1.5      Prescription Details   Frequency (times per week)  3    Duration  Progress to 45 minutes of aerobic exercise without signs/symptoms of physical distress      Intensity   THRR 40-80% of Max Heartrate  --   92-119   Ratings of Perceived Exertion  11-13    Perceived Dyspnea  0-4      Progression   Progression  Continue to progress workloads to maintain intensity without signs/symptoms of physical distress.      Resistance Training   Training Prescription  Yes     Weight  3 lbs    Reps  10-15       Perform Capillary Blood Glucose checks as needed.  Exercise Prescription Changes: Exercise Prescription Changes    Row Name 02/16/18 1400 03/12/18 1600 03/18/18 1200 03/23/18 1600 04/07/18 1400     Response to Exercise   Blood Pressure (Admit)  128/72  122/60  -  122/72  126/66   Blood Pressure (Exercise)  132/72  140/60  -  -  -   Blood Pressure (Exit)  128/70  122/60  -  122/60  114/56   Heart Rate (Admit)  64 bpm  77 bpm  -  85 bpm  63 bpm   Heart Rate (Exercise)  88 bpm  80 bpm  -  79 bpm  84 bpm   Heart Rate (Exit)  67 bpm  69 bpm  -  73 bpm  73 bpm   Oxygen Saturation (Admit)  96 %  92 %  -  88 %  96 %   Oxygen Saturation (Exercise)  93 %  90 %  -  95 %  90 %   Oxygen Saturation (Exit)  96 %  97 %  -  96 %  95 %   Rating of Perceived Exertion (Exercise)  11  14  -  15  13   Perceived Dyspnea (Exercise)  0  3  -  1  1   Symptoms  leg fatigue/pain bilateral back of both legs  -  -  none  none   Comments  walk test results  1st day  -  -  -   Duration  -  Progress to 45 minutes of aerobic exercise without signs/symptoms of physical distress  -  Progress to 45 minutes of aerobic exercise without signs/symptoms of physical distress  Continue with 45 min of aerobic exercise without signs/symptoms of physical distress.   Intensity  -  THRR unchanged  -  THRR unchanged  THRR unchanged     Progression   Progression  -  Continue to progress workloads to maintain intensity without signs/symptoms of physical distress.  -  Continue to progress workloads to maintain intensity without signs/symptoms of physical distress.  Continue to progress workloads to maintain intensity without signs/symptoms of physical distress.   Average METs  -  2.6  -  2.47  2.46     Resistance Training   Training Prescription  -  Yes  -  Yes  Yes   Weight  -  3 lb  -  3 lbs  3 lbs   Reps  -  10-15  -  10-15  10-15     Interval Training   Interval Training  -  -  -  No  No      Treadmill   MPH  -  1.7  -  1.7  1.8   Grade  -  0.5  -  0.5  0.5   Minutes  -  15  -  15  15   METs  -  2.42  -  2.42  2.5     Recumbant Bike   Level  -  1  -  2  2   RPM  -  50  -  -  -   Watts  -  -  -  14  14   Minutes  -  15  -  15  15   METs  -  3.23  -  2.49  2.49     NuStep   Level  -  1  -  3  4   SPM  -  80  -  -  -   Minutes  -  15  -  15  15   METs  -  2.1  -  2.2  2.4     Home Exercise Plan   Plans to continue exercise at  -  -  Home (comment) walking and treadmill  Home (comment) walking and treadmill  Home (comment) walking and treadmill   Frequency  -  -  Add 1 additional day to program exercise sessions.  Add 1 additional day to program exercise sessions.  Add 1 additional day to program exercise sessions.   Initial Home Exercises Provided  -  -  03/18/18  03/18/18  03/18/18   Row Name 04/21/18 1300 05/05/18 1500 05/18/18 1600 06/03/18 1500       Response to Exercise   Blood Pressure (  Admit)  122/64  120/60  122/70  120/62    Blood Pressure (Exercise)  -  -  -  146/74    Blood Pressure (Exit)  110/72  98/58  122/64  126/62    Heart Rate (Admit)  60 bpm  67 bpm  66 bpm  59 bpm    Heart Rate (Exercise)  78 bpm  88 bpm  101 bpm  99 bpm    Heart Rate (Exit)  67 bpm  84 bpm  74 bpm  78 bpm    Oxygen Saturation (Admit)  99 %  89 %  91 %  93 %    Oxygen Saturation (Exercise)  87 %  92 %  89 %  88 %    Oxygen Saturation (Exit)  98 %  94 %  96 %  93 %    Rating of Perceived Exertion (Exercise)  13  15  13  13     Perceived Dyspnea (Exercise)  0  3  1  2     Symptoms  none  none  none  none    Duration  Continue with 45 min of aerobic exercise without signs/symptoms of physical distress.  Continue with 45 min of aerobic exercise without signs/symptoms of physical distress.  Continue with 45 min of aerobic exercise without signs/symptoms of physical distress.  Continue with 45 min of aerobic exercise without signs/symptoms of physical distress.    Intensity  THRR  unchanged  THRR unchanged  THRR unchanged  THRR unchanged      Progression   Progression  Continue to progress workloads to maintain intensity without signs/symptoms of physical distress.  Continue to progress workloads to maintain intensity without signs/symptoms of physical distress.  Continue to progress workloads to maintain intensity without signs/symptoms of physical distress.  Continue to progress workloads to maintain intensity without signs/symptoms of physical distress.    Average METs  2.53  2.56  2.55  2.56      Resistance Training   Training Prescription  Yes  Yes  Yes  Yes    Weight  3 lbs  3 lbs  3 lbs  3 lbs    Reps  10-15  10-15  10-15  10-15      Interval Training   Interval Training  No  No  No  No      Treadmill   MPH  1.8  1.8  1.8  1.8    Grade  0.5  0.5  0.5  0.5    Minutes  15  15  15  15     METs  2.5  2.5  2.5  2.5      Recumbant Bike   Level  2  4  4  4     Watts  16  19  19  22     Minutes  15  15  15  15     METs  2.67  2.79  2.79  2.76      NuStep   Level  4  5  5  5     Minutes  15  15  15  15     METs  2.3  2.4  2.1  2.2      Home Exercise Plan   Plans to continue exercise at  Home (comment) walking and treadmill  Home (comment) walking and treadmill  Home (comment) walking and treadmill  Home (comment) walking and treadmill    Frequency  Add 1 additional day to program exercise sessions.  Add 2 additional days to program exercise sessions.  Add 2 additional days to program exercise sessions.  Add 2 additional days to program exercise sessions.    Initial Home Exercises Provided  03/18/18  03/18/18  03/18/18  03/18/18       Exercise Comments: Exercise Comments    Row Name 03/09/18 1142           Exercise Comments  First full day of exercise!  Patient was oriented to gym and equipment including functions, settings, policies, and procedures.  Patient's individual exercise prescription and treatment plan were reviewed.  All starting workloads were  established based on the results of the 6 minute walk test done at initial orientation visit.  The plan for exercise progression was also introduced and progression will be customized based on patient's performance and goals.          Exercise Goals and Review: Exercise Goals    Row Name 02/16/18 1447             Exercise Goals   Increase Physical Activity  Yes       Intervention  Provide advice, education, support and counseling about physical activity/exercise needs.;Develop an individualized exercise prescription for aerobic and resistive training based on initial evaluation findings, risk stratification, comorbidities and participant's personal goals.       Expected Outcomes  Short Term: Attend rehab on a regular basis to increase amount of physical activity.;Long Term: Add in home exercise to make exercise part of routine and to increase amount of physical activity.;Long Term: Exercising regularly at least 3-5 days a week.       Increase Strength and Stamina  Yes       Intervention  Provide advice, education, support and counseling about physical activity/exercise needs.;Develop an individualized exercise prescription for aerobic and resistive training based on initial evaluation findings, risk stratification, comorbidities and participant's personal goals.       Expected Outcomes  Short Term: Increase workloads from initial exercise prescription for resistance, speed, and METs.;Long Term: Improve cardiorespiratory fitness, muscular endurance and strength as measured by increased METs and functional capacity (6MWT);Short Term: Perform resistance training exercises routinely during rehab and add in resistance training at home       Able to understand and use rate of perceived exertion (RPE) scale  Yes       Intervention  Provide education and explanation on how to use RPE scale       Expected Outcomes  Short Term: Able to use RPE daily in rehab to express subjective intensity level;Long  Term:  Able to use RPE to guide intensity level when exercising independently       Able to understand and use Dyspnea scale  Yes       Intervention  Provide education and explanation on how to use Dyspnea scale       Expected Outcomes  Short Term: Able to use Dyspnea scale daily in rehab to express subjective sense of shortness of breath during exertion;Long Term: Able to use Dyspnea scale to guide intensity level when exercising independently       Knowledge and understanding of Target Heart Rate Range (THRR)  Yes       Intervention  Provide education and explanation of THRR including how the numbers were predicted and where they are located for reference       Expected Outcomes  Short Term: Able to state/look up THRR;Short Term: Able to use daily as guideline for intensity in rehab;Long Term:  Able to use THRR to govern intensity when exercising independently       Able to check pulse independently  Yes       Intervention  Provide education and demonstration on how to check pulse in carotid and radial arteries.;Review the importance of being able to check your own pulse for safety during independent exercise       Expected Outcomes  Short Term: Able to explain why pulse checking is important during independent exercise;Long Term: Able to check pulse independently and accurately       Understanding of Exercise Prescription  Yes       Intervention  Provide education, explanation, and written materials on patient's individual exercise prescription       Expected Outcomes  Short Term: Able to explain program exercise prescription;Long Term: Able to explain home exercise prescription to exercise independently          Exercise Goals Re-Evaluation : Exercise Goals Re-Evaluation    [place] Name 03/09/18 1143 03/18/18 1228 03/23/18 1622 04/07/18 1417 04/10/18 1235     Exercise Goal Re-Evaluation   Exercise Goals Review  Increase Physical Activity;Increase Strength and Stamina;Able to understand and use  rate of perceived exertion (RPE) scale;Knowledge and understanding of Target Heart Rate Range (THRR);Understanding of Exercise Prescription;Able to understand and use Dyspnea scale  Increase Physical Activity;Able to understand and use rate of perceived exertion (RPE) scale;Knowledge and understanding of Target Heart Rate Range (THRR);Understanding of Exercise Prescription;Increase Strength and Stamina;Able to understand and use Dyspnea scale;Able to check pulse independently  Increase Physical Activity;Increase Strength and Stamina;Understanding of Exercise Prescription  Increase Physical Activity;Increase Strength and Stamina;Understanding of Exercise Prescription  Increase Physical Activity;Increase Strength and Stamina;Understanding of Exercise Prescription   Comments  Reviewed RPE scale, THR and program prescription with pt today.  Pt voiced understanding and was given a copy of goals to take home.   Reviewed home exercise with pt today.  Pt plans to walk and use treadmill at home for exercise.  Reviewed THR, pulse, RPE, sign and symptoms, and when to call 911 or MD.  Also discussed weather considerations and indoor options.  Pt voiced understanding.  Ruth Roth is doing well in rehab. She is still learning the rotation of equipment.  She is getting 14 watts on the recumbent bike.  We will continue to monitor her progress.   Ruth Roth continues to do well in rehab. She is starting to get the hang of rotation.  She is up to 2.5 METs on the Nustep.  We will start to increase her workloads more and continue to monitor her progress.   Ruth Roth is doing well in rehab.  She is getting in some exercise at home for about 10 min for 5 days a week.  We are going to work on increasing her time to 15 min.    Expected Outcomes  Short: Use RPE daily to regulate intensity. Long: Follow program prescription in THR.  Short: Start to add in treadmill at least one day a week.  Long: Continue to exercise more at home.   Short: Increase  treadmill workload.  Long: Continue to increase strength and stamina.   Short: Increase workloads.  Long: Continue to move more at home.   Short: Increase walking to 15 min at home.  Long: Continue to exercise daily.    Rake Name 04/21/18 1318 05/05/18 1511 05/06/18 1217 05/18/18 1635 06/03/18 1523     Exercise Goal Re-Evaluation   Exercise Goals Review  Increase Physical Activity;Increase Strength  and Stamina;Understanding of Exercise Prescription  Increase Physical Activity;Increase Strength and Stamina;Understanding of Exercise Prescription  Increase Physical Activity;Increase Strength and Stamina;Understanding of Exercise Prescription  Increase Physical Activity;Increase Strength and Stamina;Understanding of Exercise Prescription  Increase Physical Activity;Increase Strength and Stamina;Understanding of Exercise Prescription   Comments  Ruth Roth continues to do well in rehab.  She is up to level 4 on the NuStep.  We will continue to monitor her progress.   Ruth Roth has been doing well in rehab.  She has increase her NuStep to level 5 and level 4 on the bike.  We will continue to monitor her progress.   Ruth Roth continues to do well in rehab.  Today she woke up a little sore all over.  She is getting in some home exercise and averages about 2-3 days a home.  She usually will use the treadmill for 15 min.  She tries to get 30 min sometimes. She has had some weak and dizzy spells this week.  She has not told the doctor about them yet.   Ruth Roth has been doing well in rehab.  She is up to level 4 on the NuStep.  We will continue to monitor her progress.   Ruth Roth had been doing well in rehab and getting close to graduation.  She improved her post 6MWT by 368f!!  She will need to continue to walk at home.  She has not been getting out as much with the weather, but hopes to this weekend as it gets a little warmer out.  We will montior her progression.   Expected Outcomes  Short: Increase recumbent bike.  Long: Continue to  increase strength and stamina  Short: Increase workload on treadmill.  Long: Continue to improve physical activity levels.   Short: Talk to doctor about dizzy spells and increase time to 30 min more consistently.  Long: Continue to increase walking at home.   Short: Increase treadmill.  Long: Continue to walk more at home.   Short: Continue to walk at home.  Long: Continue to maintain activity levels.       Discharge Exercise Prescription (Final Exercise Prescription Changes): Exercise Prescription Changes - 06/03/18 1500      Response to Exercise   Blood Pressure (Admit)  120/62    Blood Pressure (Exercise)  146/74    Blood Pressure (Exit)  126/62    Heart Rate (Admit)  59 bpm    Heart Rate (Exercise)  99 bpm    Heart Rate (Exit)  78 bpm    Oxygen Saturation (Admit)  93 %    Oxygen Saturation (Exercise)  88 %    Oxygen Saturation (Exit)  93 %    Rating of Perceived Exertion (Exercise)  13    Perceived Dyspnea (Exercise)  2    Symptoms  none    Duration  Continue with 45 min of aerobic exercise without signs/symptoms of physical distress.    Intensity  THRR unchanged      Progression   Progression  Continue to progress workloads to maintain intensity without signs/symptoms of physical distress.    Average METs  2.56      Resistance Training   Training Prescription  Yes    Weight  3 lbs    Reps  10-15      Interval Training   Interval Training  No      Treadmill   MPH  1.8    Grade  0.5    Minutes  15    METs  2.5      Recumbant Bike   Level  4    Watts  22    Minutes  15    METs  2.76      NuStep   Level  5    Minutes  15    METs  2.2      Home Exercise Plan   Plans to continue exercise at  Home (comment)   walking and treadmill   Frequency  Add 2 additional days to program exercise sessions.    Initial Home Exercises Provided  03/18/18       Nutrition:  Target Goals: Understanding of nutrition guidelines, daily intake of sodium <1532m, cholesterol  <2072m calories 30% from fat and 7% or less from saturated fats, daily to have 5 or more servings of fruits and vegetables.  Biometrics: Pre Biometrics - 02/16/18 1447      Pre Biometrics   Height  5' 1.5" (1.562 m)    Weight  132 lb 3.2 oz (60 kg)    Waist Circumference  30 inches    Hip Circumference  39 inches    Waist to Hip Ratio  0.77 %    BMI (Calculated)  24.58    Single Leg Stand  0.54 seconds      Post Biometrics - 05/22/18 1207       Post  Biometrics   Height  5' 1.5" (1.562 m)    Weight  137 lb (62.1 kg)    Waist Circumference  32 inches    Hip Circumference  39 inches    Waist to Hip Ratio  0.82 %    BMI (Calculated)  25.47    Single Leg Stand  --   did not want to do it      Nutrition Therapy Plan and Nutrition Goals: Nutrition Therapy & Goals - 05/20/18 1124      Nutrition Therapy   Diet  Heart Healthy/Low Sodium diet    Protein (specify units)  51-64g    Fiber  --   25   Whole Grain Foods  3 servings   Doesn't typically choose whole grains, talked about possibly using whole wheat english muffins   Saturated Fats  --   uses olive oil or non-stick pans to reduce total fat and saturated fat intake, eats salmon frequently   Fruits and Vegetables  5 servings/day   Limited fruit and vegetable intake, suggested adding fruits, vegetables, and beans (ex: add fruit to breakfast)   Sodium  1.5 grams   Or per MD; pt states her doctor told her to eat [id number]     Personal Nutrition Goals   Nutrition Goal  ST: Eationg more fruits and vegetables; she loves strawberries. at ;least two more servings per day. LT: continuing her progress and using an exercise program - possibly ForChartered loss adjusterducate and counsel regarding individualized specific dietary modifications aiming towards targeted core components such as weight, hypertension, lipid management, diabetes, heart failure and other comorbidities.    Expected  Outcomes  Short Term Goal: Understand basic principles of dietary content, such as calories, fat, sodium, cholesterol and nutrients.;Long Term Goal: Adherence to prescribed nutrition plan.       Nutrition Assessments: Nutrition Assessments - 02/16/18 1402      MEDFICTS Scores   Pre Score  0       Nutrition Goals Re-Evaluation: Nutrition Goals Re-Evaluation    RowFoleyme 03/09/18 1257  04/10/18 1237 05/06/18 1223         Goals   Nutrition Goal  Drink at least one additional glass of fluids per day. Try keeping a full glass near you during the day as a visual reminder to drink more  More fluids, limit pink salt  More fluids, heart healthy, limit sodium     Comment  She reports wt to have increased from 132# - over 140# within the past couple of weeks d/t being taken off of her fluid pill temporarily. She also feels that she does not drink enough fluids throughout the day.   Lanisa is trying to get in more fluids.  She is now up to 3 16oz waters each day.  She has also stopped using the pink salt but has not tried the other seasoning.  We did talk about some alternatives.   Zabella has been watching her diet.  She is really focused on cutting back her salt intake and is down to about 1028m a day most days of the week.  She has tried to increase her fluid intake more.  She is still getting more fruits and vegetables.  She still has not tried the Mrs. Dash seasoning yet.      Expected Outcome  Drink one additional glass of water/fluids per day consistently. Continue to take Torsemide as prescribed to limit excess fluid buildup. Monitor wt daily as instructed  Short: Try a Mrs DDeliah Bostonfor garlic  Long: Continue to try to get more fluids.   Short: Increase fluid intake.  Long: Continue to eat better.        Personal Goal #2 Re-Evaluation   Personal Goal #2  Limit Himalayan pink salt the same way that you limit other forms of salt. Use salt free seasonings instead  -  -       Personal Goal #3 Re-Evaluation    Personal Goal #3  Drink adequate fluids each day in conjunction with taking your fluid pill in order to keep wt from excess fluid from accumulating  -  -        Nutrition Goals Discharge (Final Nutrition Goals Re-Evaluation): Nutrition Goals Re-Evaluation - 05/06/18 1223      Goals   Nutrition Goal  More fluids, heart healthy, limit sodium    Comment  ICaffiehas been watching her diet.  She is really focused on cutting back her salt intake and is down to about 10085ma day most days of the week.  She has tried to increase her fluid intake more.  She is still getting more fruits and vegetables.  She still has not tried the Mrs. Dash seasoning yet.     Expected Outcome  Short: Increase fluid intake.  Long: Continue to eat better.        Psychosocial: Target Goals: Acknowledge presence or absence of significant depression and/or stress, maximize coping skills, provide positive support system. Participant is able to verbalize types and ability to use techniques and skills needed for reducing stress and depression.   Initial Review & Psychosocial Screening: Initial Psych Review & Screening - 02/16/18 1425      Initial Review   Current issues with  None Identified      Family Dynamics   Good Support System?  Yes    Comments  Her daughter in law and her neighbor      Barriers   Psychosocial barriers to participate in program  There are no identifiable barriers or psychosocial needs.;The patient should benefit  from training in stress management and relaxation.      Screening Interventions   Interventions  Encouraged to exercise    Expected Outcomes  Short Term goal: Utilizing psychosocial counselor, staff and physician to assist with identification of specific Stressors or current issues interfering with healing process. Setting desired goal for each stressor or current issue identified.;Long Term Goal: Stressors or current issues are controlled or eliminated.;Short Term goal: Identification  and review with participant of any Quality of Life or Depression concerns found by scoring the questionnaire.;Long Term goal: The participant improves quality of Life and PHQ9 Scores as seen by post scores and/or verbalization of changes       Quality of Life Scores:  Scores of 19 and below usually indicate a poorer quality of life in these areas.  A difference of  2-3 points is a clinically meaningful difference.  A difference of 2-3 points in the total score of the Quality of Life Index has been associated with significant improvement in overall quality of life, self-image, physical symptoms, and general health in studies assessing change in quality of life.  PHQ-9: Recent Review Flowsheet Data    Depression screen [facility] 2/9 05/22/2018 04/23/2018 03/27/2018 02/16/2018 11/25/2017   Decreased Interest 0 0 0 3 1   Down, Depressed, Hopeless 0 0 0 0 0   PHQ - 2 Score 0 0 0 3 1   Altered sleeping 0 - 0 0 2   Tired, decreased energy 0 - 1 3 3    Change in appetite 0 - 1 2 0   Feeling bad or failure about yourself  0 - 0 0 0   Trouble concentrating 0 - 0 0 0   Moving slowly or fidgety/restless 0 - 0 0 0   Suicidal thoughts 0 - 0 0 0   PHQ-9 Score 0 - 2 8 6    Difficult doing work/chores Not difficult at all - Not difficult at all Somewhat difficult Somewhat difficult     Interpretation of Total Score  Total Score Depression Severity:  1-4 = Minimal depression, 5-9 = Mild depression, 10-14 = Moderate depression, 15-19 = Moderately severe depression, 20-27 = Severe depression   Psychosocial Evaluation and Intervention:   Psychosocial Re-Evaluation: Psychosocial Re-Evaluation    Row Name 03/27/18 1425 05/06/18 1220 06/04/18 1055         Psychosocial Re-Evaluation   Current issues with  Current Stress Concerns  Current Stress Concerns  Current Stress Concerns     Comments  Reviewed patient health questionnaire (PHQ-9) with patient for follow up. Previously, patients score indicated  signs/symptoms of depression.  Reviewed to see if patient is improving symptom wise while in program.  Score improved and patient states that it is because she has been able to work out and has more energy.  Everlena has been doing well in rehab.  She is a good place mentally.  She does not claim to have any major stressors now.  Since her daughter was sick, she tries not to let stress get to her any more.  She has been sleeping well.  She is enjoying coming to class and knows that it is good for her to get out and get in her exercise.   Ruth Roth is doing well at home. She is staying postive and knows that staying home is the right thing.  She misses coming to class!   She continues to sleep well.  She has not been getting out to exercise but plans to as the  weather gets better.      Expected Outcomes  Short: Continue to attend [name] regularly for regular exercise and social engagement. Long: Continue to improve symptoms and manage a positive mental state  Short: Continue to attend for getting out of house.  Long: Continue to stay postive.   Short: Get out of house to walk.  Long: Continue to stay postive.      Interventions  Encouraged to attend Pulmonary Rehabilitation for the exercise  -  Encouraged to attend Pulmonary Rehabilitation for the exercise     Continue Psychosocial Services   Follow up required by staff  -  Follow up required by staff        Psychosocial Discharge (Final Psychosocial Re-Evaluation): Psychosocial Re-Evaluation - 06/04/18 1055      Psychosocial Re-Evaluation   Current issues with  Current Stress Concerns    Comments  Ruth Roth is doing well at home. She is staying postive and knows that staying home is the right thing.  She misses coming to class!   She continues to sleep well.  She has not been getting out to exercise but plans to as the weather gets better.     Expected Outcomes  Short: Get out of house to walk.  Long: Continue to stay postive.     Interventions  Encouraged to  attend Pulmonary Rehabilitation for the exercise    Continue Psychosocial Services   Follow up required by staff       Education: Education Goals: Education classes will be provided on a weekly basis, covering required topics. Participant will state understanding/return demonstration of topics presented.  Learning Barriers/Preferences: Learning Barriers/Preferences - 02/16/18 1403      Learning Barriers/Preferences   Learning Barriers  None    Learning Preferences  Video       Education Topics:  Initial Evaluation Education: - Verbal, written and demonstration of respiratory meds, oximetry and breathing techniques. Instruction on use of nebulizers and MDIs and importance of monitoring MDI activations.   Pulmonary Rehab from 05/20/2018 in [facility] Cardiac and Pulmonary Rehab  Date  02/16/18  Educator  [facility]  Instruction Review Code  1- Verbalizes Understanding      General Nutrition Guidelines/Fats and Fiber: -Group instruction provided by verbal, written material, models and posters to present the general guidelines for heart healthy nutrition. Gives an explanation and review of dietary fats and fiber.   Pulmonary Rehab from 05/20/2018 in [facility] Cardiac and Pulmonary Rehab  Date  04/22/18  Educator  [facility]  Instruction Review Code  1- Verbalizes Understanding      Controlling Sodium/Reading Food Labels: -Group verbal and written material supporting the discussion of sodium use in heart healthy nutrition. Review and explanation with models, verbal and written materials for utilization of the food label.   Pulmonary Rehab from 05/20/2018 in [facility] Cardiac and Pulmonary Rehab  Date  04/29/18  Educator  [facility]  Instruction Review Code  1- Verbalizes Understanding      Exercise Physiology & General Exercise Guidelines: - Group verbal and written instruction with models to review the exercise physiology of the cardiovascular system and associated critical values. Provides general exercise  guidelines with specific guidelines to those with heart or lung disease.    Pulmonary Rehab from 05/20/2018 in [facility] Cardiac and Pulmonary Rehab  Date  04/01/18  Educator  [facility]  Instruction Review Code  1- Verbalizes Understanding      Aerobic Exercise & Resistance Training: - Gives group verbal and written instruction on the various  components of exercise. Focuses on aerobic and resistive training programs and the benefits of this training and how to safely progress through these programs.   Pulmonary Rehab from 05/20/2018 in [facility] Cardiac and Pulmonary Rehab  Date  04/03/18  Educator  [facility]  Instruction Review Code  1- Verbalizes Understanding      Flexibility, Balance, Mind/Body Relaxation: Provides group verbal/written instruction on the benefits of flexibility and balance training, including mind/body exercise modes such as yoga, pilates and tai chi.  Demonstration and skill practice provided.   Pulmonary Rehab from 05/20/2018 in [facility] Cardiac and Pulmonary Rehab  Date  04/08/18  Educator  [facility]  Instruction Review Code  1- Verbalizes Understanding      Stress and Anxiety: - Provides group verbal and written instruction about the health risks of elevated stress and causes of high stress.  Discuss the correlation between heart/lung disease and anxiety and treatment options. Review healthy ways to manage with stress and anxiety.   Pulmonary Rehab from 05/20/2018 in [facility] Cardiac and Pulmonary Rehab  Date  04/15/18  Educator  [facility]  Instruction Review Code  1- Verbalizes Understanding      Depression: - Provides group verbal and written instruction on the correlation between heart/lung disease and depressed mood, treatment options, and the stigmas associated with seeking treatment.   Pulmonary Rehab from 05/20/2018 in [facility] Cardiac and Pulmonary Rehab  Date  05/13/18  Educator  [facility]  Instruction Review Code  1- Verbalizes Understanding      Exercise & Equipment Safety: - Individual verbal  instruction and demonstration of equipment use and safety with use of the equipment.   Pulmonary Rehab from 05/20/2018 in [facility] Cardiac and Pulmonary Rehab  Date  02/16/18  Educator  [facility]  Instruction Review Code  1- Verbalizes Understanding      Infection Prevention: - Provides verbal and written material to individual with discussion of infection control including proper hand washing and proper equipment cleaning during exercise session.   Pulmonary Rehab from 05/20/2018 in [facility] Cardiac and Pulmonary Rehab  Date  02/16/18  Educator  [facility]  Instruction Review Code  1- Verbalizes Understanding      Falls Prevention: - Provides verbal and written material to individual with discussion of falls prevention and safety.   Pulmonary Rehab from 05/20/2018 in [facility] Cardiac and Pulmonary Rehab  Date  02/16/18  Educator  [facility]  Instruction Review Code  1- Verbalizes Understanding      Diabetes: - Individual verbal and written instruction to review signs/symptoms of diabetes, desired ranges of glucose level fasting, after meals and with exercise. Advice that pre and post exercise glucose checks will be done for 3 sessions at entry of program.   Chronic Lung Diseases: - Group verbal and written instruction to review updates, respiratory medications, advancements in procedures and treatments. Discuss use of supplemental oxygen including available portable oxygen systems, continuous and intermittent flow rates, concentrators, personal use and safety guidelines. Review proper use of inhaler and spacers. Provide informative websites for self-education.    Pulmonary Rehab from 05/20/2018 in [facility] Cardiac and Pulmonary Rehab  Date  04/17/18  Educator  [facility]  Instruction Review Code  1- Verbalizes Understanding      Energy Conservation: - Provide group verbal and written instruction for methods to conserve energy, plan and organize activities. Instruct on pacing techniques, use of adaptive equipment and  posture/positioning to relieve shortness of breath.   Pulmonary Rehab from 05/20/2018 in [facility] Cardiac and Pulmonary Rehab  Date  05/15/18  Educator  [facility]  Instruction Review Code  1- Verbalizes Understanding      Triggers and Exacerbations: - Group verbal and written instruction to review types of environmental triggers and ways to prevent exacerbations. Discuss weather changes, air quality and the benefits of nasal washing. Review warning signs and symptoms to help prevent infections. Discuss techniques for effective airway clearance, coughing, and vibrations.   AED/CPR: - Group verbal and written instruction with the use of models to demonstrate the basic use of the AED with the basic ABC's of resuscitation.   Pulmonary Rehab from 05/20/2018 in [facility] Cardiac and Pulmonary Rehab  Date  03/25/18  Educator  [facility]  Instruction Review Code  1- Actuary and Physiology of the Lungs: - Group verbal and written instruction with the use of models to provide basic lung anatomy and physiology related to function, structure and complications of lung disease.   Pulmonary Rehab from 05/20/2018 in [facility] Cardiac and Pulmonary Rehab  Date  05/20/18  Educator  [facility]  Instruction Review Code  1- Verbalizes Understanding      Anatomy & Physiology of the Heart: - Group verbal and written instruction and models provide basic cardiac anatomy and physiology, with the coronary electrical and arterial systems. Review of Valvular disease and Heart Failure   Pulmonary Rehab from 05/20/2018 in [facility] Cardiac and Pulmonary Rehab  Date  03/13/18  Educator  [facility]  Instruction Review Code  1- Verbalizes Understanding      Cardiac Medications: - Group verbal and written instruction to review commonly prescribed medications for heart disease. Reviews the medication, class of the drug, and side effects.   Pulmonary Rehab from 05/20/2018 in [facility] Cardiac and Pulmonary Rehab  Date  03/20/18  Educator   [facility]  Instruction Review Code  1- Verbalizes Understanding      Know Your Numbers and Risk Factors: -Group verbal and written instruction about important numbers in your health.  Discussion of what are risk factors and how they play a role in the disease process.  Review of Cholesterol, Blood Pressure, Diabetes, and BMI and the role they play in your overall health.   Pulmonary Rehab from 05/20/2018 in [facility] Cardiac and Pulmonary Rehab  Date  05/06/18  Educator  [facility]  Instruction Review Code  1- Verbalizes Understanding      Sleep Hygiene: -Provides group verbal and written instruction about how sleep can affect your health.  Define sleep hygiene, discuss sleep cycles and impact of sleep habits. Review good sleep hygiene tips.    Pulmonary Rehab from 05/20/2018 in [facility] Cardiac and Pulmonary Rehab  Date  03/18/18  Educator  [facility]  Instruction Review Code  1- Verbalizes Understanding      Other: -Provides group and verbal instruction on various topics (see comments)    Knowledge Questionnaire Score: Knowledge Questionnaire Score - 05/22/18 1138      Knowledge Questionnaire Score   Pre Score  13/18    Post Score  15/18   reviewed with patient       Core Components/Risk Factors/Patient Goals at Admission: Personal Goals and Risk Factors at Admission - 02/16/18 1427      Core Components/Risk Factors/Patient Goals on Admission    Weight Management  Yes;Weight Loss    Intervention  Weight Management: Develop a combined nutrition and exercise program designed to reach desired caloric intake, while maintaining appropriate intake of nutrient and fiber, sodium and fats, and appropriate energy expenditure required for the weight goal.;Weight  Management: Provide education and appropriate resources to help participant work on and attain dietary goals.    Admit Weight  132 lb (59.9 kg)    Goal Weight: Short Term  127 lb (57.6 kg)    Goal Weight: Long Term  120 lb (54.4 kg)    Expected Outcomes   Short Term: Continue to assess and modify interventions until short term weight is achieved;Long Term: Adherence to nutrition and physical activity/exercise program aimed toward attainment of established weight goal;Weight Loss: Understanding of general recommendations for a balanced deficit meal plan, which promotes 1-2 lb weight loss per week and includes a negative energy balance of [phone] kcal/d;Understanding recommendations for meals to include 15-35% energy as protein, 25-35% energy from fat, 35-60% energy from carbohydrates, less than 240m of dietary cholesterol, 20-35 gm of total fiber daily;Understanding of distribution of calorie intake throughout the day with the consumption of 4-5 meals/snacks    Improve shortness of breath with ADL's  Yes    Intervention  Provide education, individualized exercise plan and daily activity instruction to help decrease symptoms of SOB with activities of daily living.    Expected Outcomes  Short Term: Improve cardiorespiratory fitness to achieve a reduction of symptoms when performing ADLs;Long Term: Be able to perform more ADLs without symptoms or delay the onset of symptoms    Hypertension  Yes    Intervention  Provide education on lifestyle modifcations including regular physical activity/exercise, weight management, moderate sodium restriction and increased consumption of fresh fruit, vegetables, and low fat dairy, alcohol moderation, and smoking cessation.;Monitor prescription use compliance.    Expected Outcomes  Short Term: Continued assessment and intervention until BP is < 140/96mHG in hypertensive participants. < 130/8060mG in hypertensive participants with diabetes, heart failure or chronic kidney disease.;Long Term: Maintenance of blood pressure at goal levels.       Core Components/Risk Factors/Patient Goals Review:  Goals and Risk Factor Review    Row Name 04/10/18 1239 05/06/18 1225 06/04/18 1053         Core Components/Risk  Factors/Patient Goals Review   Personal Goals Review  Weight Management/Obesity;Improve shortness of breath with ADL's;Hypertension  Weight Management/Obesity;Improve shortness of breath with ADL's;Hypertension  Weight Management/Obesity;Improve shortness of breath with ADL's;Hypertension;Heart Failure     Review  Ruth Roth been doing well in rehab.  Her weight has been staying the same.  Her breathing is starting to improve.  She is doing well with her blood pressures and checks them daily at home. She is also monitoring her oxygen levels too.   Ruth Roth to well in rehab. Her weight has been up and down but has not noticed a pattern other than fluid some.  She is planning to take two fluid pills tomorrow to see if it will help.  She is doing better with her breathing and doing more around the house.   She continues to do well with her blood pressures.   Ruth Roth been doing well. Her weight fluctuates about 1-2 lbs, but no major swings. She denies any heart failure symptoms.  She has not  had any shortness of breath at home.      Expected Outcomes  Short: Continue to work on weight loss.  Long: Continue to improve breathing.   Short: Continue to monitor weight closely.  Long: Continue to improve breathing.   Short: Continue to monitor weight daily.  Long: Continue to manage her heart failure.         Core Components/Risk Factors/Patient  Goals at Discharge (Final Review):  Goals and Risk Factor Review - 06/04/18 1053      Core Components/Risk Factors/Patient Goals Review   Personal Goals Review  Weight Management/Obesity;Improve shortness of breath with ADL's;Hypertension;Heart Failure    Review  Ruth Roth has been doing well. Her weight fluctuates about 1-2 lbs, but no major swings. She denies any heart failure symptoms.  She has not  had any shortness of breath at home.     Expected Outcomes  Short: Continue to monitor weight daily.  Long: Continue to manage her heart failure.        ITP  Comments: ITP Comments    Row Name 02/16/18 1359 02/23/18 0858 03/23/18 0854 04/20/18 0846 05/18/18 0827   ITP Comments  Medical Evaluation completed. Chart sent for review and changes to Dr. Emily Filbert Director of [place]. Diagnosis can be found in [facility] encounter 08/21/17  30 day review completed. ITP sent to Dr. Emily Filbert Director of [place]. Continue with ITP unless changes are made by physician.  30 day review completed. ITP sent to Dr. Emily Filbert Director of [place]. Continue with ITP unless changes are made by physician.  30 day review completed. ITP sent to Dr. Emily Filbert Director of [place]. Continue with ITP unless changes are made by physician.  30 day review completed. ITP sent to Dr. Emily Filbert Director of [place]. Continue with ITP unless changes are made by physician.   [place] Name 06/01/18 [phone] 06/15/18 0946         ITP Comments  Our program is currently closed due to COVID-19.  We are communicating with patient via phone calls and emails.  30 day review completed. ITP sent to Dr. Emily Filbert for review,changes as needed and signature. Continue with ITP unless changes directed by Dr. Sabra Heck.          Comments:

## 2018-06-17 ENCOUNTER — Other Ambulatory Visit: Payer: Self-pay

## 2018-06-17 ENCOUNTER — Ambulatory Visit: Payer: Medicare Other

## 2018-06-17 ENCOUNTER — Ambulatory Visit
Admission: RE | Admit: 2018-06-17 | Discharge: 2018-06-17 | Disposition: A | Discharge status: Home / Self Care | Payer: Medicare Other | Source: Ambulatory Visit | Attending: Gastroenterology | Admitting: Gastroenterology

## 2018-06-17 DIAGNOSIS — D739 Disease of spleen, unspecified: Secondary | ICD-10-CM | POA: Diagnosis not present

## 2018-06-17 DIAGNOSIS — K743 Primary biliary cirrhosis: Secondary | ICD-10-CM | POA: Insufficient documentation

## 2018-06-23 ENCOUNTER — Encounter: Payer: Self-pay | Admitting: *Deleted

## 2018-06-23 DIAGNOSIS — I5032 Chronic diastolic (congestive) heart failure: Secondary | ICD-10-CM

## 2018-06-24 DIAGNOSIS — R195 Other fecal abnormalities: Secondary | ICD-10-CM | POA: Diagnosis not present

## 2018-06-24 DIAGNOSIS — K743 Primary biliary cirrhosis: Secondary | ICD-10-CM | POA: Diagnosis not present

## 2018-06-24 DIAGNOSIS — Z800 Family history of malignant neoplasm of digestive organs: Secondary | ICD-10-CM | POA: Diagnosis not present

## 2018-06-24 DIAGNOSIS — D509 Iron deficiency anemia, unspecified: Secondary | ICD-10-CM | POA: Diagnosis not present

## 2018-06-26 DIAGNOSIS — B309 Viral conjunctivitis, unspecified: Secondary | ICD-10-CM | POA: Diagnosis not present

## 2018-07-01 DIAGNOSIS — K754 Autoimmune hepatitis: Secondary | ICD-10-CM | POA: Diagnosis not present

## 2018-07-06 ENCOUNTER — Telehealth: Payer: Self-pay

## 2018-07-06 NOTE — Telephone Encounter (Signed)

## 2018-07-07 ENCOUNTER — Telehealth (INDEPENDENT_AMBULATORY_CARE_PROVIDER_SITE_OTHER): Payer: Medicare Other | Admitting: Cardiovascular Disease

## 2018-07-07 ENCOUNTER — Encounter: Payer: Self-pay | Admitting: Cardiovascular Disease

## 2018-07-07 ENCOUNTER — Other Ambulatory Visit: Payer: Self-pay

## 2018-07-07 VITALS — BP 114/50 | HR 62 | Ht 62.5 in | Wt 135.0 lb

## 2018-07-07 DIAGNOSIS — I4819 Other persistent atrial fibrillation: Secondary | ICD-10-CM

## 2018-07-07 NOTE — Patient Instructions (Signed)
Medication Instructions:  Continue same medications If you need a refill on your cardiac medications before your next appointment, please call your pharmacy.   Lab work: None If you have labs (blood work) drawn today and your tests are completely normal, you will receive your results only by: . MyChart Message (if you have MyChart) OR . A paper copy in the mail If you have any lab test that is abnormal or we need to change your treatment, we will call you to review the results.  Testing/Procedures: None  Follow-Up: At [facility], you and your health needs are our priority.  As part of our continuing mission to provide you with exceptional heart care, we have created designated Provider Care Teams.  These Care Teams include your primary Cardiologist (physician) and Advanced Practice Providers (APPs -  Physician Assistants and Nurse Practitioners) who all work together to provide you with the care you need, when you need it. You will need a follow up appointment in 4 months.  Please call our office 2 months in advance to schedule this appointment.  You may see Anzal Bartnick, MD or one of the following Advanced Practice Providers on your designated Care Team:   Christopher Berge, NP Ryan Dunn, PA-C . Jacquelyn Visser, PA-C  

## 2018-07-07 NOTE — Progress Notes (Signed)
Virtual Visit via Telephone Note   This visit type was conducted due to national recommendations for restrictions regarding the COVID-19 Pandemic (e.g. social distancing) in an effort to limit this patient's exposure and mitigate transmission in our community.  Due to her co-morbid illnesses, this patient is at least at moderate risk for complications without adequate follow up.  This format is felt to be most appropriate for this patient at this time.  The patient did not have access to video technology/had technical difficulties with video requiring transitioning to audio format only (telephone).  All issues noted in this document were discussed and addressed.  No physical exam could be performed with this format.  Please refer to the patient's chart for her  consent to telehealth for [facility].   Evaluation Performed:  Follow-up visit  Date:  07/07/2018   ID:  Ruth Roth, DOB Feb 23, 1929, MRN [id number]  Patient Location: Home Provider Location: Office  PCP:  Einar Pheasant, MD  Cardiologist:  [name], MD  Electrophysiologist:  None   Chief Complaint: Follow-up visit  History of Present Illness:    Ruth Roth is a 85 y.o. female was reached via phone for a follow-up visit regarding  coronary artery disease ,chronic diastolic heart failure, mitral regurgitation and persistent atrial fibrillation .  She has known history of hypertension, peptic ulcer disease, fibrocystic breast disease, inflammatory arthritis, autoimmune hepatitis and neuropathy.  She had a small non-ST elevation myocardial infarction in March 2016 with acute diastolic heart failure after a GI illness.  Echocardiogram showed normal LV systolic function. Cardiac catheterization showed showed 60% ostial left circumflex hazy stenosis which was possibly the culprit. Mild LAD/RCA disease. She was treated medically.   She had cardioversion 3 times in 2018/2019. She had worsening heart failure 2019.  Right and  left cardiac catheterization in June 2019 showed  showed mild nonobstructive coronary artery disease.  Ejection fraction was 50 to 55% with severe mitral regurgitation.  Right heart catheterization showed severely elevated filling pressures with moderate to severe pulmonary hypertension and severely reduced cardiac output. I transferred the patient to [facility] where she was effectively diuresed with milrinone with subsequent improvement in symptoms.  She had a transesophageal echocardiogram done which showed normal LV systolic function with only mild mitral regurgitation.  The mitral regurgitation was felt to be functional with subsequent improvement after diuresis.  She was subsequently noted to be in sinus rhythm even without an antiarrhythmic medication.  She has been doing very well with no chest pain, worsening dyspnea or leg edema.  No palpitations.  Her weight continues to be stable.  The patient does not have symptoms concerning for COVID-19 infection (fever, chills, cough, or new shortness of breath).    Past Medical History:  Diagnosis Date  . (HFpEF) heart failure with preserved ejection fraction ([place])    a. 07/2017 Echo: EF 50-55%, no rwma, mild AS/AI, mod MR, mod dil LA, mod TR, PASP 27mmHg.  . Autoimmune hepatitis ([place])    followed by Dr [name]  . Fibrocystic breast disease   . Glaucoma   . Hypercholesterolemia   . Hypertension   . Hypothyroidism   . Inflammatory arthritis   . MI (myocardial infarction) ([place])   . Neuropathy   . Non-obstructive Coronary artery disease    a. 05/2014 NSTEMI/Cath: LM nl, LAD mild dzs, LCX 60ost hazy (? culprit), RCA mild dzs. EF 60% by echo-->Med Rx; b. 08/2017 Cath: LM 30ost, LAD min irregs, LCX small, 40ost/p, RCA large,  min irregs, RPDA/RPL1 nl, EF 50-55%. 4+MR.  . Osteoarthritis   . Permanent atrial fibrillation    a. CHA2DS2VASc = 6-->Xarelto.  . 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  Severe mitral regurgitation    a. 07/2017 Echo: Mod MR; b. 08/2017 Cath: 4+/Severe MR.   Past Surgical History:  Procedure Laterality Date  . ABDOMINAL HYSTERECTOMY  1963   partial, secondary to fibroids  . APPENDECTOMY    . Billroth    . CARDIAC CATHETERIZATION  05/12/14   [facility]  . CARDIOVERSION N/A 12/26/2016   Procedure: CARDIOVERSION;  Surgeon: [name], MD;  Location: [facility] ORS;  Service: Cardiovascular;  Laterality: N/A;  . CARDIOVERSION N/A 05/16/2017   Procedure: CARDIOVERSION;  Surgeon: [name], MD;  Location: [facility] ORS;  Service: Cardiovascular;  Laterality: N/A;  . CARDIOVERSION N/A 06/09/2017   Procedure: CARDIOVERSION;  Surgeon: [name], MD;  Location: [facility] ORS;  Service: Cardiovascular;  Laterality: N/A;  . CHOLECYSTECTOMY  1998  . COLONOSCOPY  2009  . RIGHT/LEFT HEART CATH AND CORONARY ANGIOGRAPHY Bilateral 08/21/2017   Procedure: RIGHT/LEFT HEART CATH AND CORONARY ANGIOGRAPHY;  Surgeon: [name], MD;  Location: [place] CV LAB;  Service: Cardiovascular;  Laterality: Bilateral;  . TEE WITHOUT CARDIOVERSION N/A 08/25/2017   Procedure: TRANSESOPHAGEAL ECHOCARDIOGRAM (TEE);  Surgeon: Jolaine Artist, MD;  Location: [facility] ENDOSCOPY;  Service: Cardiovascular;  Laterality: N/A;  . UPPER GI ENDOSCOPY  2004     Current Meds  Medication Sig  . brimonidine-timolol (COMBIGAN) 0.2-0.5 % ophthalmic solution Place 1 drop into the left eye 2 (two) times daily.   . dorzolamide (TRUSOPT) 2 % ophthalmic solution Place 1 drop into the left eye 2 (two) times daily.  . fluticasone (FLONASE) 50 MCG/ACT nasal spray Place 2 sprays into both nostrils daily as needed for allergies.   Marland Kitchen gabapentin (NEURONTIN) 300 MG capsule TAKE 2 CAPSULES BY MOUTH FOUR TIMES DAILY  . ipratropium (ATROVENT) 0.06 % nasal spray Place 2 sprays into both nostrils daily as needed for rhinitis.   Marland Kitchen levothyroxine (SYNTHROID, LEVOTHROID) 125 MCG tablet TAKE 1 TABLET BY MOUTH DAILY BEFORE  BREAKFAST  . metoprolol tartrate (LOPRESSOR) 50 MG tablet TAKE 1 TABLET(50 MG) BY MOUTH TWICE DAILY  . pantoprazole (PROTONIX) 40 MG tablet Take 1 tablet (40 mg total) by mouth every evening.  . Rivaroxaban (XARELTO) 15 MG TABS tablet Take 1 tablet (15 mg total) by mouth daily with supper.  . torsemide (DEMADEX) 20 MG tablet Take 1 tablet (20 mg total) by mouth daily.  . traMADol (ULTRAM) 50 MG tablet Take 1 tablet (50 mg total) by mouth daily as needed (for pain.).  Marland Kitchen ursodiol (ACTIGALL) 300 MG capsule Take 300-600 mg by mouth See admin instructions. Take 2 capsules (600 mg) daily after lunch & 1 capsule (300 mg) at night.     Allergies:   Statins; Haldol [haloperidol lactate]; Librax [chlordiazepoxide-clidinium]; Plavix [clopidogrel bisulfate]; and Ramipril   Social History   Tobacco Use  . Smoking status: Never Smoker  . Smokeless tobacco: Never Used  Substance Use Topics  . Alcohol use: No    Alcohol/week: 0.0 standard drinks  . Drug use: No     Family Hx: The patient's family history includes Colon cancer in her daughter; Esophageal cancer in her brother; Ovarian cancer in her daughter; Stroke in her mother. There is no history of Breast cancer.  ROS:   Please see the history of  present illness.     All other systems reviewed and are negative.   Prior CV studies:   The following studies were reviewed today:  Reviewed results of cardiac testing from last year  Labs/Other Tests and Data Reviewed:    EKG:  No ECG reviewed.  Recent Labs: 08/27/2017: Magnesium 2.2 01/27/2018: ALT 14; TSH 2.26 02/11/2018: B Natriuretic Peptide 713.5 02/25/2018: BUN 42; Creatinine, Ser 1.35; Potassium 4.7; Sodium 138 05/05/2018: Hemoglobin 11.4; Platelets 206   Recent Lipid Panel Lab Results  Component Value Date/Time   CHOL 144 01/27/2018 10:09 AM   TRIG 107.0 01/27/2018 10:09 AM   HDL 44.10 01/27/2018 10:09 AM   CHOLHDL 3 01/27/2018 10:09 AM   LDLCALC 78 01/27/2018 10:09 AM    LDLDIRECT 143.5 12/17/2012 09:55 AM    Wt Readings from Last 3 Encounters:  07/07/18 135 lb (61.2 kg)  05/22/18 136 lb 12.8 oz (62.1 kg)  05/22/18 137 lb (62.1 kg)     Objective:    Vital Signs:  BP (!) 114/50   Pulse 62   Ht 5' 2.5" (1.588 m)   Wt 135 lb (61.2 kg)   BMI 24.30 kg/m    VITAL SIGNS:  reviewed  ASSESSMENT & PLAN:    1.  Persistent atrial fibrillation: She appears to be in sinus rhythm.  Continue  metoprolol 50 mg twice daily. Continue anticoagulation with Xarelto.  2. Chronic diastolic heart failure: She appears to be euvolemic on small dose torsemide.  3. Essential hypertension: Blood pressure is controlled on metoprolol.  4.  Functional mitral regurgitation: Severe MR was only in the setting of severe volume overload.  MR was only mild on most recent transesophageal echo.   COVID-19 Education: The signs and symptoms of COVID-19 were discussed with the patient and how to seek care for testing (follow up with PCP or arrange E-visit).  The importance of social distancing was discussed today.  Time:   Today, I have spent 15 minutes with the patient with telehealth technology discussing the above problems.     Medication Adjustments/Labs and Tests Ordered: Current medicines are reviewed at length with the patient today.  Concerns regarding medicines are outlined above.   Tests Ordered: No orders of the defined types were placed in this encounter.   Medication Changes: No orders of the defined types were placed in this encounter.   Disposition:  Follow up in 4 month(s)  Signed, [name], MD  07/07/2018 2:39 PM    [place]

## 2018-07-14 ENCOUNTER — Encounter: Payer: Self-pay | Admitting: *Deleted

## 2018-07-14 DIAGNOSIS — I5032 Chronic diastolic (congestive) heart failure: Secondary | ICD-10-CM

## 2018-07-22 ENCOUNTER — Other Ambulatory Visit (INDEPENDENT_AMBULATORY_CARE_PROVIDER_SITE_OTHER): Payer: Medicare Other

## 2018-07-22 ENCOUNTER — Other Ambulatory Visit: Payer: Self-pay | Admitting: Internal Medicine

## 2018-07-22 ENCOUNTER — Telehealth: Payer: Self-pay | Admitting: *Deleted

## 2018-07-22 ENCOUNTER — Other Ambulatory Visit: Payer: Self-pay

## 2018-07-22 DIAGNOSIS — E7800 Pure hypercholesterolemia, unspecified: Secondary | ICD-10-CM

## 2018-07-22 DIAGNOSIS — K754 Autoimmune hepatitis: Secondary | ICD-10-CM

## 2018-07-22 DIAGNOSIS — I10 Essential (primary) hypertension: Secondary | ICD-10-CM | POA: Diagnosis not present

## 2018-07-22 DIAGNOSIS — E875 Hyperkalemia: Secondary | ICD-10-CM

## 2018-07-22 DIAGNOSIS — N183 Chronic kidney disease, stage 3 unspecified: Secondary | ICD-10-CM

## 2018-07-22 LAB — BASIC METABOLIC PANEL
BUN: 66 mg/dL — ABNORMAL HIGH (ref 6–23)
CO2: 25 mEq/L (ref 19–32)
Calcium: 9.1 mg/dL (ref 8.4–10.5)
Chloride: 108 mEq/L (ref 96–112)
Creatinine, Ser: 1.53 mg/dL — ABNORMAL HIGH (ref 0.40–1.20)
GFR: 32.02 mL/min — ABNORMAL LOW (ref >60.00)
Glucose, Bld: 88 mg/dL (ref 70–99)
Potassium: 5.2 mEq/L — ABNORMAL HIGH (ref 3.5–5.1)
Sodium: 140 mEq/L (ref 135–145)

## 2018-07-22 LAB — LIPID PANEL
Cholesterol: 197 mg/dL (ref 0–200)
HDL: 51.6 mg/dL (ref >39.00)
LDL Cholesterol: 111 mg/dL — ABNORMAL HIGH (ref 0–99)
NonHDL: 145.84
Total CHOL/HDL Ratio: 4
Triglycerides: 175 mg/dL — ABNORMAL HIGH (ref 0.0–149.0)
VLDL: 35 mg/dL (ref 0.0–40.0)

## 2018-07-22 LAB — HEPATIC FUNCTION PANEL
ALT: 16 U/L (ref 0–35)
AST: 14 U/L (ref 0–37)
Albumin: 4 g/dL (ref 3.5–5.2)
Alkaline Phosphatase: 91 U/L (ref 39–117)
Bilirubin, Direct: 0.1 mg/dL (ref 0.0–0.3)
Total Bilirubin: 0.3 mg/dL (ref 0.2–1.2)
Total Protein: 6.4 g/dL (ref 6.0–8.3)

## 2018-07-22 NOTE — Telephone Encounter (Signed)
Just wanted you to know that I placed order for stat met b.  This is the only test I need.  Thanks

## 2018-07-22 NOTE — Telephone Encounter (Signed)
-----   Message from Einar Pheasant, MD sent at 07/22/2018  1:17 PM EDT ----- Please call pt and notify he that her kidney function has decreased some when compared to the previous check.  Needs to stay hydrated.  Potassium is slightly elevated.  Need to confirm that she is not taking any potasium supplements or using an increased amount of salt substitutes.  Need to recheck stat potassium in the next 24-48 hours.  I will place the order.  Cholesterol increased from last check.  Low cholesterol diet.  We will follow.  Liver function tests wnl.  Also, need to know if she has seen nephrology (kidney specialist).  If not or no appt scheduled, then I would like to place order for referral.  Let me know if agreeable.

## 2018-07-22 NOTE — Telephone Encounter (Signed)
Order placed for stat met b.  See result note.

## 2018-07-22 NOTE — Progress Notes (Signed)
Order placed for nephrology referral.   °

## 2018-07-23 ENCOUNTER — Ambulatory Visit (HOSPITAL_COMMUNITY)
Admission: RE | Admit: 2018-07-23 | Discharge: 2018-07-23 | Disposition: A | Discharge status: Home / Self Care | Payer: Medicare Other | Source: Ambulatory Visit | Attending: Internal Medicine | Admitting: Internal Medicine

## 2018-07-23 DIAGNOSIS — N183 Chronic kidney disease, stage 3 unspecified: Secondary | ICD-10-CM

## 2018-07-23 DIAGNOSIS — D649 Anemia, unspecified: Secondary | ICD-10-CM | POA: Diagnosis not present

## 2018-07-23 DIAGNOSIS — I5032 Chronic diastolic (congestive) heart failure: Secondary | ICD-10-CM

## 2018-07-23 DIAGNOSIS — I340 Nonrheumatic mitral (valve) insufficiency: Secondary | ICD-10-CM

## 2018-07-23 NOTE — Addendum Note (Signed)
Encounter addended by: Marlise Eves, RN on: 07/23/2018 3:35 PM  Actions taken: Clinical Note Signed

## 2018-07-23 NOTE — Telephone Encounter (Signed)
Thanks, I added it to the appt note & will delete the Potassium order when she comes in.

## 2018-07-23 NOTE — Progress Notes (Signed)
No med changes. No new orders. Appointment sent to scheduler.

## 2018-07-23 NOTE — Progress Notes (Signed)
Heart Failure TeleHealth Note  Due to national recommendations of social distancing due to [place] 19, Audio/video telehealth visit is felt to be most appropriate for this patient at this time.  See MyChart message from today for patient consent regarding telehealth for [facility].  Date:  07/23/2018   ID:  Ruth Roth, DOB 1928-10-11, MRN [id number]  Location: Home  Provider location: [place] [facility] Type of Visit: Established patient  PCP:  Einar Pheasant, MD  Cardiologist:  [name], MD Primary HF: Shawnte Winton  Chief Complaint: Heart Failure follow-up   History of Present Illness:  Ruth Roth is a 85 y.o. female with a history of 85 year old female with a history of coronary artery disease, chronic diastolic congestive heart failure, persistent atrial fibrillation, hypertension, peptic ulcer disease, fibrocystic breast disease, inflammatory arthritis, autoimmune hepatitis, and neuropathy.  Admitted to [facility] 08/21/17 s/p R/LHC which showed significantly elevated filling pressures, severe MR, and low cardiac output, and then transferred to [facility] 6/14 for consideration of MitraClip. AKI noted on admission felt to be due to cardiorenal syndrome. She required milrinone and lasix for diuresis. Diuresed 25 lbs and transitioned to 20 mg torsemide daily. Case and images reviewed at length with structural heart team. MR felt to be truly functional with improvement from hemodynamic optimization. Not felt to be MitraClip candidate at this time.Discharge weight: 121 lbs.   She presents via Engineer, civil (consulting) for a telehealth visit today. Says she is doing very well. Denies any LE edema. Mild SOB with exertion. No change. No dizziness. No orthopnea or PND. Goes to the grocery store by herself. Cardiac rehab cancelled due to [place]. She really enjoyed it. Weight up a little bit at 130->135. PCP following labs and said potassium and creatinine was elevated.  Recheck tomorrow. Taking torsemide 20mg  daily.  Cardiac Studies: Echo 10/09/17: EF 55-60%, no AI, mild to mod MR, LA moderately dilated, RA mildly dilated, RV normal, PA peak pressure 52 mmHg  [facility] 08/21/2017 - Mild non-obstructive CAD RHC Procedural Findings:(Obtained from Scan 08/21/17 at 1030) Hemodynamics (mmHg) RA mean17 RV68/11 (19) PA62/27 (44) PCWP41 AO147/67 (102) Cardiac Output(Fick) 2.45 Cardiac Index(Fick) 1.46 PVR 1.23 WU  Echo 08/01/17 LVEF 50-55%, Mild AS, Mod MR, Mod LAE, Mild RAE, Mod TR, PA peak pressure 65 mm Hg.MR ++ calcified, and RV mild/moderately reduced function.   Ruth Roth denies symptoms worrisome for COVID 19.   Past Medical History:  Diagnosis Date  . (HFpEF) heart failure with preserved ejection fraction ([place])    a. 07/2017 Echo: EF 50-55%, no rwma, mild AS/AI, mod MR, mod dil LA, mod TR, PASP 26mmHg.  . Autoimmune hepatitis ([place])    followed by Dr [name]  . Fibrocystic breast disease   . Glaucoma   . Hypercholesterolemia   . Hypertension   . Hypothyroidism   . Inflammatory arthritis   . MI (myocardial infarction) ([place])   . Neuropathy   . Non-obstructive Coronary artery disease    a. 05/2014 NSTEMI/Cath: LM nl, LAD mild dzs, LCX 60ost hazy (? culprit), RCA mild dzs. EF 60% by echo-->Med Rx; b. 08/2017 Cath: LM 30ost, LAD min irregs, LCX small, 40ost/p, RCA large, min irregs, RPDA/RPL1 nl, EF 50-55%. 4+MR.  . Osteoarthritis   . Permanent atrial fibrillation    a. CHA2DS2VASc = 6-->Xarelto.  . 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 Severe mitral regurgitation    a. 07/2017 Echo: Mod MR;  b. 08/2017 Cath: 4+/Severe MR.   Past Surgical History:  Procedure Laterality Date  . ABDOMINAL HYSTERECTOMY  1963   partial, secondary to fibroids  . APPENDECTOMY    . Billroth    . CARDIAC CATHETERIZATION  05/12/14   [facility]  . CARDIOVERSION N/A 12/26/2016   Procedure: CARDIOVERSION;   Surgeon: [name], MD;  Location: [facility] ORS;  Service: Cardiovascular;  Laterality: N/A;  . CARDIOVERSION N/A 05/16/2017   Procedure: CARDIOVERSION;  Surgeon: [name], MD;  Location: [facility] ORS;  Service: Cardiovascular;  Laterality: N/A;  . CARDIOVERSION N/A 06/09/2017   Procedure: CARDIOVERSION;  Surgeon: [name], MD;  Location: [facility] ORS;  Service: Cardiovascular;  Laterality: N/A;  . CHOLECYSTECTOMY  1998  . COLONOSCOPY  2009  . RIGHT/LEFT HEART CATH AND CORONARY ANGIOGRAPHY Bilateral 08/21/2017   Procedure: RIGHT/LEFT HEART CATH AND CORONARY ANGIOGRAPHY;  Surgeon: [name], MD;  Location: [place] CV LAB;  Service: Cardiovascular;  Laterality: Bilateral;  . TEE WITHOUT CARDIOVERSION N/A 08/25/2017   Procedure: TRANSESOPHAGEAL ECHOCARDIOGRAM (TEE);  Surgeon: Jolaine Artist, MD;  Location: [facility] ENDOSCOPY;  Service: Cardiovascular;  Laterality: N/A;  . UPPER GI ENDOSCOPY  2004     Current Outpatient Medications  Medication Sig Dispense Refill  . brimonidine-timolol (COMBIGAN) 0.2-0.5 % ophthalmic solution Place 1 drop into the left eye 2 (two) times daily.     . dorzolamide (TRUSOPT) 2 % ophthalmic solution Place 1 drop into the left eye 2 (two) times daily.    . fluticasone (FLONASE) 50 MCG/ACT nasal spray Place 2 sprays into both nostrils daily as needed for allergies.     Marland Kitchen gabapentin (NEURONTIN) 300 MG capsule TAKE 2 CAPSULES BY MOUTH FOUR TIMES DAILY 720 capsule 0  . ipratropium (ATROVENT) 0.06 % nasal spray Place 2 sprays into both nostrils daily as needed for rhinitis.     Marland Kitchen levothyroxine (SYNTHROID, LEVOTHROID) 125 MCG tablet TAKE 1 TABLET BY MOUTH DAILY BEFORE BREAKFAST 90 tablet 0  . metoprolol tartrate (LOPRESSOR) 50 MG tablet TAKE 1 TABLET(50 MG) BY MOUTH TWICE DAILY 180 tablet 3  . pantoprazole (PROTONIX) 40 MG tablet Take 1 tablet (40 mg total) by mouth every evening. 90 tablet 3  . Rivaroxaban (XARELTO) 15 MG TABS tablet Take 1 tablet (15  mg total) by mouth daily with supper. 30 tablet 6  . torsemide (DEMADEX) 20 MG tablet Take 1 tablet (20 mg total) by mouth daily. 30 tablet 3  . traMADol (ULTRAM) 50 MG tablet Take 1 tablet (50 mg total) by mouth daily as needed (for pain.). 30 tablet 1  . ursodiol (ACTIGALL) 300 MG capsule Take 300-600 mg by mouth See admin instructions. Take 2 capsules (600 mg) daily after lunch & 1 capsule (300 mg) at night.     No current facility-administered medications for this encounter.     Allergies:   Statins; Haldol [haloperidol lactate]; Librax [chlordiazepoxide-clidinium]; Plavix [clopidogrel bisulfate]; and Ramipril   Social History:  The patient  reports that she has never smoked. She has never used smokeless tobacco. She reports that she does not drink alcohol or use drugs.   Family History:  The patient's family history includes Colon cancer in her daughter; Esophageal cancer in her brother; Ovarian cancer in her daughter; Stroke in her mother.   ROS:  Please see the history of present illness.   All other systems are personally reviewed and negative.   Exam:  (Video/Tele Health Call; Exam is subjective and or/visual.) General:  Speaks in  full sentences. No resp difficulty. Lungs: Normal respiratory effort with conversation.  Abdomen: Non-distended per patient report Extremities: Pt denies edema. Neuro: Alert & oriented x 3.   Recent Labs: 08/27/2017: Magnesium 2.2 01/27/2018: TSH 2.26 02/11/2018: B Natriuretic Peptide 713.5 05/05/2018: Hemoglobin 11.4; Platelets 206 07/22/2018: ALT 16; BUN 66; Creatinine, Ser 1.53; Potassium 5.2 No hemolysis seen; Sodium 140  Personally reviewed   Wt Readings from Last 3 Encounters:  07/07/18 61.2 kg (135 lb)  05/22/18 62.1 kg (136 lb 12.8 oz)  05/22/18 62.1 kg (137 lb)      ASSESSMENT AND PLAN:  1.Chronic diastolic congestive heart failure with low output: - Echo 08/01/17 LVEF 50-55%, Mild AS, Mod MR, Mod LAE, Mild RAE, Mod TR, RV moderately  HK PA peak pressure 65 mm Hg. - Cath 08/21/17 with signficantly elevated filling pressures, severe MR, and low cardiac output. Had cardiorenal syndrome on admit with severe MR and RV failure and required milrinone - Echo 10/09/17: EF 55-60%, mild to mod MR - Doing well. NYHA II. Volume status stable. Continue Torsemide 20 daily for now. However sounds like she may have recurrent AKI. I asked her to call us if Dr. Nicki Reaper is going to change her torsemide. May have to go back to 20 qod + extra as needed - Continue Lopressor 50 bid - Cardiac rehab on hold due to [place]. Would benefit from maintenance program if possible 2. Severe mitral regurgitation: - Echo 08/01/17 LVEF 50-55%, Mild AS, Mod MR, Mod LAE, Mild RAE, Mod TR, PA peak pressure 65 mm Hg.MR ++ calcified, and RV mild/moderately reduced function. - TEE 6/17 showed only mild MR with restricted posterior leaflet.  - Echo 10/09/17: EF 55-60%, mild to mod MR -Likely functional MR and MR improved with hemodynamic optimization.  Will follow closely if fails close medical therapy consider MitraClip 3. Persistent atrial fibrillation: - Has previously failed amiodarone and DCCV x3.  - Regular pulse - Continue Lopressor 50 bid.  - Continue Xarelto. No bleeding reporte.  - Seen by Dr. Rayann Heman 12/01/17. He does not recommend either afib ablation or AV node ablation + PPM.   4. Essential hypertension: -Stable. Well controlled.  5. Recurrent AKI - PCP checking.  - I asked her to call us if Dr. Nicki Reaper is going to change her torsemide. May have to go back to 20 qod + extra as neede   COVID screen The patient does not have any symptoms that suggest any further testing/ screening at this time.  Social distancing reinforced today.  Recommended follow-up: 3 months  Relevant cardiac medications were reviewed at length with the patient today.   The patient does not have concerns regarding their medications at this time.   The following changes were  made today:  As above  Today, I have spent 22 minutes with the patient with telehealth technology discussing the above issues .    Signed, Glori Bickers, MD  07/23/2018 2:42 PM  [facility] [place] [address] Heart and [place] [address] [phone] (office) [phone] (fax)

## 2018-07-24 ENCOUNTER — Encounter: Payer: Self-pay | Admitting: Internal Medicine

## 2018-07-24 ENCOUNTER — Ambulatory Visit (INDEPENDENT_AMBULATORY_CARE_PROVIDER_SITE_OTHER): Payer: Medicare Other | Admitting: Internal Medicine

## 2018-07-24 ENCOUNTER — Other Ambulatory Visit (INDEPENDENT_AMBULATORY_CARE_PROVIDER_SITE_OTHER): Payer: Medicare Other

## 2018-07-24 ENCOUNTER — Other Ambulatory Visit: Payer: Self-pay

## 2018-07-24 DIAGNOSIS — E875 Hyperkalemia: Secondary | ICD-10-CM

## 2018-07-24 DIAGNOSIS — I10 Essential (primary) hypertension: Secondary | ICD-10-CM | POA: Diagnosis not present

## 2018-07-24 DIAGNOSIS — I4891 Unspecified atrial fibrillation: Secondary | ICD-10-CM | POA: Diagnosis not present

## 2018-07-24 DIAGNOSIS — D509 Iron deficiency anemia, unspecified: Secondary | ICD-10-CM

## 2018-07-24 DIAGNOSIS — N183 Chronic kidney disease, stage 3 unspecified: Secondary | ICD-10-CM

## 2018-07-24 DIAGNOSIS — I5031 Acute diastolic (congestive) heart failure: Secondary | ICD-10-CM

## 2018-07-24 DIAGNOSIS — M545 Low back pain, unspecified: Secondary | ICD-10-CM

## 2018-07-24 LAB — BASIC METABOLIC PANEL
BUN: 69 mg/dL — ABNORMAL HIGH (ref 6–23)
CO2: 25 mEq/L (ref 19–32)
Calcium: 9.3 mg/dL (ref 8.4–10.5)
Chloride: 103 mEq/L (ref 96–112)
Creatinine, Ser: 1.75 mg/dL — ABNORMAL HIGH (ref 0.40–1.20)
GFR: 27.42 mL/min — ABNORMAL LOW (ref >60.00)
Glucose, Bld: 96 mg/dL (ref 70–99)
Potassium: 4.7 mEq/L (ref 3.5–5.1)
Sodium: 139 mEq/L (ref 135–145)

## 2018-07-24 MED ORDER — TRAMADOL HCL 50 MG PO TABS: 50.0000 mg | ORAL_TABLET | Freq: Every day | ORAL | 0 refills | Status: DC | PRN

## 2018-07-24 NOTE — Progress Notes (Signed)
[name], female   DOB: 03/21/1928, 85 y.o.   MRN: [id number]   Virtual Visit via telephone Note  This visit type was conducted due to national recommendations for restrictions regarding the COVID-19 pandemic (e.g. social distancing).  This format is felt to be most appropriate for this patient at this time.  All issues noted in this document were discussed and addressed.  No physical exam was performed (except for noted visual exam findings with Video Visits).   I connected with Ruth Roth by telephone and verified that I am speaking with the correct person using two identifiers. Location patient: home Location provider: work Persons participating in the telephone visit: patient, provider  I discussed the limitations, risks, security and privacy concerns of performing an evaluation and management service by telephone and the availability of in person appointment.. The patient expressed understanding and agreed to proceed.   Reason for visit: acute visit.   HPI: Reviewed recent labs.  Found to have elevated BUN 69 and creatinine 1.75.  She reports she has been doing relatively well.  Has not been sick.  Denies nausea, vomiting and diarrhea. Trying to stay in due to COVID restrictions.  No fever.  No cough, congestion or sob.  No acid reflux.  No abdominal pain.  Taking demadex daily now.  Eating and drinking.      ROS: See pertinent positives and negatives per HPI.  Past Medical History:  Diagnosis Date  . (HFpEF) heart failure with preserved ejection fraction ([place])    a. 07/2017 Echo: EF 50-55%, no rwma, mild AS/AI, mod MR, mod dil LA, mod TR, PASP 39mmHg.  . Autoimmune hepatitis ([place])    followed by Dr [name]  . Fibrocystic breast disease   . Glaucoma   . Hypercholesterolemia   . Hypertension   . Hypothyroidism   . Inflammatory arthritis   . MI (myocardial infarction) ([place])   . Neuropathy   . Non-obstructive Coronary artery disease    a. 05/2014 NSTEMI/Cath: LM  nl, LAD mild dzs, LCX 60ost hazy (? culprit), RCA mild dzs. EF 60% by echo-->Med Rx; b. 08/2017 Cath: LM 30ost, LAD min irregs, LCX small, 40ost/p, RCA large, min irregs, RPDA/RPL1 nl, EF 50-55%. 4+MR.  . Osteoarthritis   . Permanent atrial fibrillation    a. CHA2DS2VASc = 6-->Xarelto.  . 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 Severe mitral regurgitation    a. 07/2017 Echo: Mod MR; b. 08/2017 Cath: 4+/Severe MR.    Past Surgical History:  Procedure Laterality Date  . ABDOMINAL HYSTERECTOMY  1963   partial, secondary to fibroids  . APPENDECTOMY    . Billroth    . CARDIAC CATHETERIZATION  05/12/14   [facility]  . CARDIOVERSION N/A 12/26/2016   Procedure: CARDIOVERSION;  Surgeon: [name], MD;  Location: [facility] ORS;  Service: Cardiovascular;  Laterality: N/A;  . CARDIOVERSION N/A 05/16/2017   Procedure: CARDIOVERSION;  Surgeon: [name], MD;  Location: [facility] ORS;  Service: Cardiovascular;  Laterality: N/A;  . CARDIOVERSION N/A 06/09/2017   Procedure: CARDIOVERSION;  Surgeon: [name], MD;  Location: [facility] ORS;  Service: Cardiovascular;  Laterality: N/A;  . CHOLECYSTECTOMY  1998  . COLONOSCOPY  2009  . RIGHT/LEFT HEART CATH AND CORONARY ANGIOGRAPHY Bilateral 08/21/2017   Procedure: RIGHT/LEFT HEART CATH AND CORONARY ANGIOGRAPHY;  Surgeon: [name], MD;  Location: [place] CV LAB;  Service: Cardiovascular;  Laterality: Bilateral;  . TEE WITHOUT  CARDIOVERSION N/A 08/25/2017   Procedure: TRANSESOPHAGEAL ECHOCARDIOGRAM (TEE);  Surgeon: Jolaine Artist, MD;  Location: [facility] ENDOSCOPY;  Service: Cardiovascular;  Laterality: N/A;  . UPPER GI ENDOSCOPY  2004    Family History  Problem Relation Age of Onset  . Stroke Mother   . Esophageal cancer Brother        also had lung cancer  . Ovarian cancer Daughter   . Colon cancer Daughter   . Breast cancer Neg Hx     SOCIAL HX: reviewed.    Current Outpatient  Medications:  .  brimonidine-timolol (COMBIGAN) 0.2-0.5 % ophthalmic solution, Place 1 drop into the left eye 2 (two) times daily. , Disp: , Rfl:  .  dorzolamide (TRUSOPT) 2 % ophthalmic solution, Place 1 drop into the left eye 2 (two) times daily., Disp: , Rfl:  .  fluticasone (FLONASE) 50 MCG/ACT nasal spray, Place 2 sprays into both nostrils daily as needed for allergies. , Disp: , Rfl:  .  gabapentin (NEURONTIN) 300 MG capsule, TAKE 2 CAPSULES BY MOUTH FOUR TIMES DAILY, Disp: 720 capsule, Rfl: 0 .  ipratropium (ATROVENT) 0.06 % nasal spray, Place 2 sprays into both nostrils daily as needed for rhinitis. , Disp: , Rfl:  .  levothyroxine (SYNTHROID, LEVOTHROID) 125 MCG tablet, TAKE 1 TABLET BY MOUTH DAILY BEFORE BREAKFAST, Disp: 90 tablet, Rfl: 0 .  metoprolol tartrate (LOPRESSOR) 50 MG tablet, TAKE 1 TABLET(50 MG) BY MOUTH TWICE DAILY, Disp: 180 tablet, Rfl: 3 .  pantoprazole (PROTONIX) 40 MG tablet, Take 1 tablet (40 mg total) by mouth every evening., Disp: 90 tablet, Rfl: 3 .  Rivaroxaban (XARELTO) 15 MG TABS tablet, Take 1 tablet (15 mg total) by mouth daily with supper., Disp: 30 tablet, Rfl: 6 .  torsemide (DEMADEX) 20 MG tablet, Take 1 tablet (20 mg total) by mouth daily., Disp: 30 tablet, Rfl: 3 .  traMADol (ULTRAM) 50 MG tablet, Take 1 tablet (50 mg total) by mouth daily as needed (for pain.)., Disp: 30 tablet, Rfl: 0 .  ursodiol (ACTIGALL) 300 MG capsule, Take 300-600 mg by mouth See admin instructions. Take 2 capsules (600 mg) daily after lunch & 1 capsule (300 mg) at night., Disp: , Rfl:   EXAM:  GENERAL: alert.  Sounds to be in no acute distress.  Answering questions appropriately.    PSYCH/NEURO: pleasant and cooperative, no obvious depression or anxiety, speech and thought processing grossly intact  ASSESSMENT AND PLAN:  Discussed the following assessment and plan:  Acute diastolic CHF (congestive heart failure) (HCC)  Iron deficiency anemia, unspecified iron deficiency  anemia type  Atrial fibrillation, unspecified type ([place])  Essential hypertension  CKD (chronic kidney disease), stage III (HCC)  Midline low back pain without sciatica, unspecified chronicity  Acute diastolic CHF (congestive heart failure) ([place]) Denies increased sob or swelling.  She is on torsemide daily.  Elevated BUN/Cr as outlined.  Decrease torsemide to three days per week.  Just had abdominal ultrasound - no hydronephrosis or other acute kidney abnormality.  Discussed appt with nephrology.  Recheck kidney function after med adjustment.   Anemia Has known iron deficient anemia.  Being followed by hematology.  Has received IV iron.   Atrial fibrillation ([place]) On xarelto.  Followed by cardiology.  She feels - stable.    Hypertension Reports blood pressure has been doing well.  Follow pressures.  Follow metabolic panel.   CKD (chronic kidney disease), stage III ([place]) Worsening renal function as outlined.  Adjust medication - torsemide.  Recent  abdominal ultrasound - no hydronephrosis or other acute abnormality.  Schedule f/u with nephrology.    Back pain Previously saw Dr Sharlet Salina.  Request refill for tramadol.  Check database.  Refilled x 1. F/u with pain clinic referral.     I discussed the assessment and treatment plan with the patient. The patient was provided an opportunity to ask questions and all were answered. The patient agreed with the plan and demonstrated an understanding of the instructions.   The patient was advised to call back or seek an in-person evaluation if the symptoms worsen or if the condition fails to improve as anticipated.  I provided 25 minutes of non-face-to-face time during this encounter.   Einar Pheasant, MD

## 2018-07-26 ENCOUNTER — Encounter: Payer: Self-pay | Admitting: Internal Medicine

## 2018-07-26 DIAGNOSIS — N183 Chronic kidney disease, stage 3 unspecified: Secondary | ICD-10-CM | POA: Insufficient documentation

## 2018-07-26 NOTE — Assessment & Plan Note (Signed)
Worsening renal function as outlined.  Adjust medication - torsemide.  Recent abdominal ultrasound - no hydronephrosis or other acute abnormality.  Schedule f/u with nephrology.

## 2018-07-26 NOTE — Assessment & Plan Note (Signed)
Reports blood pressure has been doing well.  Follow pressures.  Follow metabolic panel.   

## 2018-07-26 NOTE — Assessment & Plan Note (Signed)
On xarelto.  Followed by cardiology.  She feels - stable.

## 2018-07-26 NOTE — Assessment & Plan Note (Signed)
Has known iron deficient anemia.  Being followed by hematology.  Has received IV iron.

## 2018-07-26 NOTE — Assessment & Plan Note (Signed)
Previously saw Dr Sharlet Salina.  Request refill for tramadol.  Check database.  Refilled x 1. F/u with pain clinic referral.

## 2018-07-26 NOTE — Assessment & Plan Note (Signed)
Denies increased sob or swelling.  She is on torsemide daily.  Elevated BUN/Cr as outlined.  Decrease torsemide to three days per week.  Just had abdominal ultrasound - no hydronephrosis or other acute kidney abnormality.  Discussed appt with nephrology.  Recheck kidney function after med adjustment.

## 2018-07-29 DIAGNOSIS — H401131 Primary open-angle glaucoma, bilateral, mild stage: Secondary | ICD-10-CM | POA: Diagnosis not present

## 2018-08-04 ENCOUNTER — Telehealth: Payer: Self-pay | Admitting: *Deleted

## 2018-08-04 NOTE — Telephone Encounter (Signed)
Copied from [place] [id number]. Topic: General - Other >> Aug 04, 2018  9:33 AM Rainey Pines A wrote: Patient would like a callback from scheduler. Patient stated that  Dr. Nicki Reaper asked that patient come back in 2 weeks for labs. Patient was seen on 07-24-2018.

## 2018-08-05 ENCOUNTER — Telehealth: Payer: Self-pay | Admitting: *Deleted

## 2018-08-05 ENCOUNTER — Encounter: Payer: Self-pay | Admitting: *Deleted

## 2018-08-05 DIAGNOSIS — N183 Chronic kidney disease, stage 3 unspecified: Secondary | ICD-10-CM

## 2018-08-05 DIAGNOSIS — E063 Autoimmune thyroiditis: Secondary | ICD-10-CM

## 2018-08-05 DIAGNOSIS — I5032 Chronic diastolic (congestive) heart failure: Secondary | ICD-10-CM

## 2018-08-05 NOTE — Telephone Encounter (Signed)
Orders placed for labs

## 2018-08-05 NOTE — Telephone Encounter (Signed)
Please place future lab orders.   

## 2018-08-07 ENCOUNTER — Other Ambulatory Visit: Payer: Self-pay

## 2018-08-07 ENCOUNTER — Other Ambulatory Visit (INDEPENDENT_AMBULATORY_CARE_PROVIDER_SITE_OTHER): Payer: Medicare Other

## 2018-08-07 DIAGNOSIS — N183 Chronic kidney disease, stage 3 unspecified: Secondary | ICD-10-CM

## 2018-08-07 DIAGNOSIS — E063 Autoimmune thyroiditis: Secondary | ICD-10-CM | POA: Diagnosis not present

## 2018-08-07 LAB — TSH: TSH: 0.12 u[IU]/mL — ABNORMAL LOW (ref 0.35–4.50)

## 2018-08-07 LAB — BASIC METABOLIC PANEL
BUN: 28 mg/dL — ABNORMAL HIGH (ref 6–23)
CO2: 23 mEq/L (ref 19–32)
Calcium: 9 mg/dL (ref 8.4–10.5)
Chloride: 109 mEq/L (ref 96–112)
Creatinine, Ser: 1.04 mg/dL (ref 0.40–1.20)
GFR: 49.99 mL/min — ABNORMAL LOW (ref >60.00)
Glucose, Bld: 80 mg/dL (ref 70–99)
Potassium: 4.8 mEq/L (ref 3.5–5.1)
Sodium: 140 mEq/L (ref 135–145)

## 2018-08-09 ENCOUNTER — Other Ambulatory Visit: Payer: Self-pay | Admitting: Internal Medicine

## 2018-08-09 DIAGNOSIS — N183 Chronic kidney disease, stage 3 unspecified: Secondary | ICD-10-CM

## 2018-08-09 DIAGNOSIS — E039 Hypothyroidism, unspecified: Secondary | ICD-10-CM

## 2018-08-09 NOTE — Progress Notes (Signed)
Order placed for f/u labs.  

## 2018-08-10 ENCOUNTER — Ambulatory Visit: Payer: Self-pay

## 2018-08-10 NOTE — Telephone Encounter (Signed)
Incoming  Call from patient requesting her lab results  Informed pt of Dr.  Einar Pheasant of 08/09/18 test results Pt.  Voiced understanding.  Will check if she has any synthroid for 153mcg  If she dosen't will call office back

## 2018-08-12 DIAGNOSIS — K7460 Unspecified cirrhosis of liver: Secondary | ICD-10-CM | POA: Diagnosis not present

## 2018-08-13 DIAGNOSIS — E039 Hypothyroidism, unspecified: Secondary | ICD-10-CM | POA: Diagnosis not present

## 2018-08-14 ENCOUNTER — Other Ambulatory Visit: Payer: Self-pay | Admitting: Internal Medicine

## 2018-08-19 DIAGNOSIS — K7460 Unspecified cirrhosis of liver: Secondary | ICD-10-CM | POA: Diagnosis not present

## 2018-08-19 DIAGNOSIS — K743 Primary biliary cirrhosis: Secondary | ICD-10-CM | POA: Diagnosis not present

## 2018-08-19 DIAGNOSIS — K754 Autoimmune hepatitis: Secondary | ICD-10-CM | POA: Diagnosis not present

## 2018-08-19 DIAGNOSIS — D509 Iron deficiency anemia, unspecified: Secondary | ICD-10-CM | POA: Diagnosis not present

## 2018-08-20 ENCOUNTER — Other Ambulatory Visit: Payer: Self-pay | Admitting: Gastroenterology

## 2018-08-20 DIAGNOSIS — K754 Autoimmune hepatitis: Secondary | ICD-10-CM

## 2018-08-20 DIAGNOSIS — K7460 Unspecified cirrhosis of liver: Secondary | ICD-10-CM

## 2018-08-20 DIAGNOSIS — K743 Primary biliary cirrhosis: Secondary | ICD-10-CM

## 2018-08-20 DIAGNOSIS — D509 Iron deficiency anemia, unspecified: Secondary | ICD-10-CM

## 2018-08-24 DIAGNOSIS — K219 Gastro-esophageal reflux disease without esophagitis: Secondary | ICD-10-CM | POA: Diagnosis not present

## 2018-08-24 DIAGNOSIS — M810 Age-related osteoporosis without current pathological fracture: Secondary | ICD-10-CM | POA: Diagnosis not present

## 2018-09-01 ENCOUNTER — Telehealth: Payer: Self-pay

## 2018-09-01 NOTE — Telephone Encounter (Signed)
Copied from [place] [id number]. Topic: General - Other >> Sep 01, 2018  3:02 PM Celene Kras A wrote: Reason for CRM: Pt called regarding levothyroxine. Pt states with the lower dose she is having trouble staying active as she gets tired quickly. Pt states her other provider wants her on this medication for 2 more weeks and then wants a blood draw for TSH. Pt states she knows that is two weeks earlier than her blood draw for her kidney functions and is requesting advice. Please advise.

## 2018-09-02 ENCOUNTER — Other Ambulatory Visit: Payer: Self-pay | Admitting: Internal Medicine

## 2018-09-02 ENCOUNTER — Ambulatory Visit: Payer: Medicare Other

## 2018-09-02 DIAGNOSIS — D7589 Other specified diseases of blood and blood-forming organs: Secondary | ICD-10-CM

## 2018-09-02 NOTE — Progress Notes (Signed)
Order placed for f/u labs.  

## 2018-09-02 NOTE — Telephone Encounter (Signed)
Patients lab appt has been moved up.

## 2018-09-03 ENCOUNTER — Other Ambulatory Visit: Payer: Self-pay

## 2018-09-04 ENCOUNTER — Inpatient Hospital Stay (HOSPITAL_BASED_OUTPATIENT_CLINIC_OR_DEPARTMENT_OTHER): Payer: Medicare Other | Admitting: Oncology

## 2018-09-04 ENCOUNTER — Encounter: Payer: Self-pay | Admitting: Oncology

## 2018-09-04 ENCOUNTER — Other Ambulatory Visit: Payer: Self-pay

## 2018-09-04 ENCOUNTER — Inpatient Hospital Stay: Payer: Medicare Other | Attending: Oncology

## 2018-09-04 VITALS — BP 144/85 | HR 82 | Temp 98.2°F | Resp 18 | Wt 139.3 lb

## 2018-09-04 DIAGNOSIS — I2510 Atherosclerotic heart disease of native coronary artery without angina pectoris: Secondary | ICD-10-CM | POA: Insufficient documentation

## 2018-09-04 DIAGNOSIS — Z7951 Long term (current) use of inhaled steroids: Secondary | ICD-10-CM

## 2018-09-04 DIAGNOSIS — Z79899 Other long term (current) drug therapy: Secondary | ICD-10-CM

## 2018-09-04 DIAGNOSIS — R040 Epistaxis: Secondary | ICD-10-CM | POA: Insufficient documentation

## 2018-09-04 DIAGNOSIS — I4821 Permanent atrial fibrillation: Secondary | ICD-10-CM | POA: Insufficient documentation

## 2018-09-04 DIAGNOSIS — D500 Iron deficiency anemia secondary to blood loss (chronic): Secondary | ICD-10-CM | POA: Diagnosis not present

## 2018-09-04 DIAGNOSIS — Z7901 Long term (current) use of anticoagulants: Secondary | ICD-10-CM

## 2018-09-04 DIAGNOSIS — I110 Hypertensive heart disease with heart failure: Secondary | ICD-10-CM

## 2018-09-04 LAB — CBC WITH DIFFERENTIAL/PLATELET
Abs Immature Granulocytes: 0.02 10*3/uL (ref 0.00–0.07)
Basophils Absolute: 0 10*3/uL (ref 0.0–0.1)
Basophils Relative: 1 %
Eosinophils Absolute: 0.2 10*3/uL (ref 0.0–0.5)
Eosinophils Relative: 3 %
HCT: 38 % (ref 36.0–46.0)
Hemoglobin: 11.7 g/dL — ABNORMAL LOW (ref 12.0–15.0)
Immature Granulocytes: 0 %
Lymphocytes Relative: 24 %
Lymphs Abs: 1.5 10*3/uL (ref 0.7–4.0)
MCH: 30.8 pg (ref 26.0–34.0)
MCHC: 30.8 g/dL (ref 30.0–36.0)
MCV: 100 fL (ref 80.0–100.0)
Monocytes Absolute: 0.8 10*3/uL (ref 0.1–1.0)
Monocytes Relative: 12 %
Neutro Abs: 3.8 10*3/uL (ref 1.7–7.7)
Neutrophils Relative %: 60 %
Platelets: 191 10*3/uL (ref 150–400)
RBC: 3.8 MIL/uL — ABNORMAL LOW (ref 3.87–5.11)
RDW: 13.4 % (ref 11.5–15.5)
WBC: 6.4 10*3/uL (ref 4.0–10.5)
nRBC: 0 % (ref 0.0–0.2)

## 2018-09-04 LAB — IRON AND TIBC
Iron: 40 ug/dL (ref 28–170)
Saturation Ratios: 9 % — ABNORMAL LOW (ref 10.4–31.8)
TIBC: 459 ug/dL — ABNORMAL HIGH (ref 250–450)
UIBC: 419 ug/dL

## 2018-09-04 LAB — FERRITIN: Ferritin: 13 ng/mL (ref 11–307)

## 2018-09-04 NOTE — Addendum Note (Signed)
Addended by: Earlie Server on: 09/04/2018 11:41 PM   Modules accepted: Orders

## 2018-09-04 NOTE — Progress Notes (Signed)
Hematology/Oncology follow up note [facility] [phone] Fax:([phone]) [phone]   Patient Care Team: Einar Pheasant, MD as PCP - General (Unknown Physician Specialty) [name], MD as PCP - Cardiology (Cardiology) Bary Castilla, Forest Gleason, MD (General Surgery)  REFERRING PROVIDER: Einar Pheasant, MD CHIEF COMPLAINTS/REASON FOR VISIT:  Evaluation of anemia  HISTORY OF PRESENTING ILLNESS:  Ruth Roth is a  85 y.o.  female with PMH listed below who was referred to me for evaluation of anemia Reviewed patient's recent labs, Labs revealed anemia with hemoglobin of 10.2 on 01/27/2018. MCV 88.9,   Reviewed patient's previous labs ordered by primary care physician's office, anemia is chronic onset , duration is since 2016, gradually declines. No aggravating or improving factors. She takes Xarelto for A Fib. Has recurrent epistaxis. History of IDA, cannot tolerate oral iron supplements.   Associated signs and symptoms: Patient reports fatigue.  deniesSOB with exertion.  Denies weight loss, easy bruising, hematochezia, hemoptysis, hematuria. Context: History of GI bleeding: deneis               History of Chronic kidney disease: CKD               Last colonoscopy: 2007 Patient has a history of autoimmune hepatitis/PBC  Follows up with gastroenterology.  INTERVAL HISTORY Ruth Roth is a 85 y.o. female who has above history reviewed by me today presents for follow up visit for management of anemia Problems and complaints are listed below:  Previously received IV Feraheme 510 mg x 2. Patient is on chronic anticoagulation with Xarelto for atrial fibrillation. Denies hematochezia, hematuria, hematemesis, epistaxis, black tarry stool or easy bruising.   Denies any fatigue.  Energy is at baseline. No new complaints. .   Review of Systems  Constitutional: Negative for appetite change, chills, fatigue and fever.  HENT:   Negative for hearing loss and  voice change.   Eyes: Negative for eye problems.  Respiratory: Negative for chest tightness and cough.   Cardiovascular: Negative for chest pain.  Gastrointestinal: Negative for abdominal distention, abdominal pain and blood in stool.  Endocrine: Negative for hot flashes.  Genitourinary: Negative for difficulty urinating and frequency.   Musculoskeletal: Negative for arthralgias.  Skin: Negative for itching and rash.  Neurological: Negative for extremity weakness.  Hematological: Negative for adenopathy.  Psychiatric/Behavioral: Negative for confusion.    MEDICAL HISTORY:  Past Medical History:  Diagnosis Date  . (HFpEF) heart failure with preserved ejection fraction ([place])    a. 07/2017 Echo: EF 50-55%, no rwma, mild AS/AI, mod MR, mod dil LA, mod TR, PASP 19mmHg.  . Autoimmune hepatitis ([place])    followed by Dr [name]  . Fibrocystic breast disease   . Glaucoma   . Hypercholesterolemia   . Hypertension   . Hypothyroidism   . Inflammatory arthritis   . MI (myocardial infarction) ([place])   . Neuropathy   . Non-obstructive Coronary artery disease    a. 05/2014 NSTEMI/Cath: LM nl, LAD mild dzs, LCX 60ost hazy (? culprit), RCA mild dzs. EF 60% by echo-->Med Rx; b. 08/2017 Cath: LM 30ost, LAD min irregs, LCX small, 40ost/p, RCA large, min irregs, RPDA/RPL1 nl, EF 50-55%. 4+MR.  . Osteoarthritis   . Permanent atrial fibrillation    a. CHA2DS2VASc = 6-->Xarelto.  . 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 Severe mitral regurgitation    a. 07/2017 Echo: Mod MR; b. 08/2017 Cath: 4+/Severe MR.  SURGICAL HISTORY: Past Surgical History:  Procedure Laterality Date  . ABDOMINAL HYSTERECTOMY  1963   partial, secondary to fibroids  . APPENDECTOMY    . Billroth    . CARDIAC CATHETERIZATION  05/12/14   [facility]  . CARDIOVERSION N/A 12/26/2016   Procedure: CARDIOVERSION;  Surgeon: [name], MD;  Location: [facility] ORS;  Service:  Cardiovascular;  Laterality: N/A;  . CARDIOVERSION N/A 05/16/2017   Procedure: CARDIOVERSION;  Surgeon: [name], MD;  Location: [facility] ORS;  Service: Cardiovascular;  Laterality: N/A;  . CARDIOVERSION N/A 06/09/2017   Procedure: CARDIOVERSION;  Surgeon: [name], MD;  Location: [facility] ORS;  Service: Cardiovascular;  Laterality: N/A;  . CHOLECYSTECTOMY  1998  . COLONOSCOPY  2009  . RIGHT/LEFT HEART CATH AND CORONARY ANGIOGRAPHY Bilateral 08/21/2017   Procedure: RIGHT/LEFT HEART CATH AND CORONARY ANGIOGRAPHY;  Surgeon: [name], MD;  Location: [place] [facility];  Service: Cardiovascular;  Laterality: Bilateral;  . TEE WITHOUT CARDIOVERSION N/A 08/25/2017   Procedure: TRANSESOPHAGEAL ECHOCARDIOGRAM (TEE);  Surgeon: Jolaine Artist, MD;  Location: [facility] ENDOSCOPY;  Service: Cardiovascular;  Laterality: N/A;  . UPPER GI ENDOSCOPY  2004    SOCIAL HISTORY: Social History   Socioeconomic History  . Marital status: Widowed    Spouse name: Not on file  . Number of children: Not on file  . Years of education: Not on file  . Highest education level: Not on file  Occupational History  . Not on file  Social Needs  . Financial resource strain: Not hard at all  . Food insecurity    Worry: Never true    Inability: Never true  . Transportation needs    Medical: No    Non-medical: No  Tobacco Use  . Smoking status: Never Smoker  . Smokeless tobacco: Never Used  Substance and Sexual Activity  . Alcohol use: No    Alcohol/week: 0.0 standard drinks  . Drug use: No  . Sexual activity: Never  Lifestyle  . Physical activity    Days per week: 3 days    Minutes per session: 40 min  . Stress: Not at all  Relationships  . Social Herbalist on phone: Not on file    Gets together: Not on file    Attends religious service: Not on file    Active member of club or organization: Not on file    Attends meetings of clubs or organizations: Not on file    Relationship  status: Not on file  . Intimate partner violence    Fear of current or ex partner: Not on file    Emotionally abused: Not on file    Physically abused: Not on file    Forced sexual activity: Not on file  Other Topics Concern  . Not on file  Social History Narrative  . Not on file    FAMILY HISTORY: Family History  Problem Relation Age of Onset  . Stroke Mother   . Esophageal cancer Brother        also had lung cancer  . Ovarian cancer Daughter   . Colon cancer Daughter   . Breast cancer Neg Hx     ALLERGIES:  is allergic to statins; haldol [haloperidol lactate]; librax [chlordiazepoxide-clidinium]; plavix [clopidogrel bisulfate]; and ramipril.  MEDICATIONS:  Current Outpatient Medications  Medication Sig Dispense Refill  . brimonidine-timolol (COMBIGAN) 0.2-0.5 % ophthalmic solution Place 1 drop into the left eye 2 (two) times daily.     . dorzolamide (  TRUSOPT) 2 % ophthalmic solution Place 1 drop into the left eye 2 (two) times daily.    Marland Kitchen gabapentin (NEURONTIN) 300 MG capsule TAKE 2 CAPSULES BY MOUTH FOUR TIMES DAILY 720 capsule 0  . ipratropium (ATROVENT) 0.06 % nasal spray Place 2 sprays into both nostrils daily as needed for rhinitis.     Marland Kitchen latanoprost (XALATAN) 0.005 % ophthalmic solution APPLY 1 DROP(S) IN BOTH EYES ONCE A DAY    . levothyroxine (SYNTHROID) 125 MCG tablet TAKE 1 TABLET BY MOUTH EVERY DAY BEFORE BREAKFAST (Patient taking differently: Take 112 mcg by mouth daily before breakfast. ) 90 tablet 1  . metoprolol tartrate (LOPRESSOR) 50 MG tablet TAKE 1 TABLET(50 MG) BY MOUTH TWICE DAILY 180 tablet 3  . Multiple Vitamin (MULTI-VITAMIN) tablet Take by mouth.    . pantoprazole (PROTONIX) 40 MG tablet Take 1 tablet (40 mg total) by mouth every evening. 90 tablet 3  . Rivaroxaban (XARELTO) 15 MG TABS tablet Take 1 tablet (15 mg total) by mouth daily with supper. 30 tablet 6  . torsemide (DEMADEX) 20 MG tablet Take 1 tablet (20 mg total) by mouth daily. 30 tablet 3   . traMADol (ULTRAM) 50 MG tablet Take 1 tablet (50 mg total) by mouth daily as needed (for pain.). 30 tablet 0  . ursodiol (ACTIGALL) 300 MG capsule Take 300-600 mg by mouth See admin instructions. Take 2 capsules (600 mg) daily after lunch & 1 capsule (300 mg) at night.    . fluticasone (FLONASE) 50 MCG/ACT nasal spray Place 2 sprays into both nostrils daily as needed for allergies.      No current facility-administered medications for this visit.      PHYSICAL EXAMINATION: ECOG PERFORMANCE STATUS: 1 - Symptomatic but completely ambulatory Vitals:   09/04/18 1330  BP: (!) 144/85  Pulse: 82  Resp: 18  Temp: 98.2 F (36.8 C)   Filed Weights   09/04/18 1330  Weight: 139 lb 4.8 oz (63.2 kg)    Physical Exam Constitutional:      General: She is not in acute distress. HENT:     Head: Normocephalic and atraumatic.  Eyes:     General: No scleral icterus.    Pupils: Pupils are equal, round, and reactive to light.  Neck:     Musculoskeletal: Normal range of motion and neck supple.  Cardiovascular:     Rate and Rhythm: Normal rate and regular rhythm.     Heart sounds: Normal heart sounds.     Comments: Irregular rhythm.  Pulmonary:     Effort: Pulmonary effort is normal. No respiratory distress.     Breath sounds: No wheezing.  Abdominal:     General: Bowel sounds are normal. There is no distension.     Palpations: Abdomen is soft. There is no mass.     Tenderness: There is no abdominal tenderness.  Musculoskeletal: Normal range of motion.        General: No deformity.  Skin:    General: Skin is warm and dry.     Findings: No erythema or rash.  Neurological:     Mental Status: She is alert and oriented to person, place, and time.     Cranial Nerves: No cranial nerve deficit.     Coordination: Coordination normal.  Psychiatric:        Behavior: Behavior normal.        Thought Content: Thought content normal.      LABORATORY DATA:  I have reviewed the data as  listed  Lab Results  Component Value Date   WBC 6.4 09/04/2018   HGB 11.7 (L) 09/04/2018   HCT 38.0 09/04/2018   MCV 100.0 09/04/2018   PLT 191 09/04/2018   Recent Labs    01/27/18 1009  02/11/18 1231 02/18/18 1456 02/25/18 1056 07/22/18 0945 07/24/18 0959 08/07/18 1051  NA 136   < > 139 138 138 140 139 140  K 4.2   < > 5.0 4.1 4.7 5.2 No hemolysis seen* 4.7 4.8  CL 97   < > 107 102 103 108 103 109  CO2 28   < > 23 24 27 25 25 23   GLUCOSE 95   < > 66* 96 96 88 96 80  BUN 63*   < > 38* 45* 42* 66* 69* 28*  CREATININE 1.71*   < > 1.25* 1.33* 1.35* 1.53* 1.75* 1.04  CALCIUM 8.4   < > 8.4* 8.3* 8.7* 9.1 9.3 9.0  GFRNONAA  --   --  39* 36* 35*  --   --   --   GFRAA  --   --  45* 42* 41*  --   --   --   PROT 6.2  --   --   --   --  6.4  --   --   ALBUMIN 3.6  --   --   --   --  4.0  --   --   AST 14  --   --   --   --  14  --   --   ALT 14  --   --   --   --  16  --   --   ALKPHOS 119*  --   --   --   --  91  --   --   BILITOT 0.3  --   --   --   --  0.3  --   --   BILIDIR 0.1  --   --   --   --  0.1  --   --    < > = values in this interval not displayed.   Iron/TIBC/Ferritin/ %Sat    Component Value Date/Time   IRON 40 09/04/2018 1308   TIBC 459 (H) 09/04/2018 1308   FERRITIN 13 09/04/2018 1308   IRONPCTSAT 9 (L) 09/04/2018 1308     RADIOGRAPHIC STUDIES: I have personally reviewed the radiological images as listed and agreed with the findings in the report. 06/17/2018 Ultrasound abdomen complete Heterogeneous echotexture in the liver.  Extrahepatic and intrahepatic.  Duct dilation, stable since prior study. Small hyperechoic area within the spleen the largest 7 mm.  Compatible with a hemangioma.  Stable Prior cholecystectomy. Mildly increased texture in the kidney suggest chronic kidney disease.  ASSESSMENT & PLAN:  1. Iron deficiency anemia due to chronic blood loss   2. Chronic anticoagulation   Labs are reviewed and discussed with patient. Patient's hemoglobin has  remained stable. Iron panel is consistent with iron deficiency. Recommend patient to proceed with Feraheme 510 mg x 1.   Recommend patient to continue follow-up with gastroenterology and discuss for colonoscopy. She was recently seen by Dr. [name]. 07/23/2018 stool negative.  Check UA  She also may have a component of anemia secondary to chronic kidney disease. Recommend to repeat labs, MD assessment in 6 months to ensure stability of iron level and hemoglobin level.  Orders Placed This Encounter  Procedures  . CBC with Differential/Platelet  Standing Status:   Future    Standing Expiration Date:   09/04/2019  . Iron and TIBC    Standing Status:   Future    Standing Expiration Date:   09/04/2019  . Ferritin    Standing Status:   Future    Standing Expiration Date:   09/04/2019    All questions were answered. The patient knows to call the clinic with any problems questions or concerns. We spent sufficient time to discuss many aspect of care, questions were answered to patient's satisfaction. Total face to face encounter time for this patient visit was 15 min. >50% of the time was  spent in counseling and coordination of care.   Return of visit: 6 months   Earlie Server, MD, PhD Hematology Oncology [facility] Pager- [phone] 09/04/2018

## 2018-09-04 NOTE — Progress Notes (Signed)
Patient here for follow up. No concerns voiced.  °

## 2018-09-09 DIAGNOSIS — M810 Age-related osteoporosis without current pathological fracture: Secondary | ICD-10-CM | POA: Diagnosis not present

## 2018-09-10 ENCOUNTER — Other Ambulatory Visit: Payer: Self-pay

## 2018-09-10 ENCOUNTER — Inpatient Hospital Stay: Payer: Medicare Other

## 2018-09-10 ENCOUNTER — Inpatient Hospital Stay: Payer: Medicare Other | Attending: Oncology

## 2018-09-10 VITALS — BP 121/78 | HR 78 | Temp 98.0°F | Resp 18

## 2018-09-10 DIAGNOSIS — D509 Iron deficiency anemia, unspecified: Secondary | ICD-10-CM | POA: Diagnosis not present

## 2018-09-10 DIAGNOSIS — D500 Iron deficiency anemia secondary to blood loss (chronic): Secondary | ICD-10-CM

## 2018-09-10 DIAGNOSIS — Z7901 Long term (current) use of anticoagulants: Secondary | ICD-10-CM

## 2018-09-10 LAB — URINALYSIS, DIPSTICK ONLY
Bilirubin Urine: NEGATIVE
Glucose, UA: NEGATIVE mg/dL
Hgb urine dipstick: NEGATIVE
Ketones, ur: NEGATIVE mg/dL
Nitrite: NEGATIVE
Protein, ur: NEGATIVE mg/dL
Specific Gravity, Urine: 1.016 (ref 1.005–1.030)
pH: 5 (ref 5.0–8.0)

## 2018-09-10 MED ORDER — SODIUM CHLORIDE 0.9 % IV SOLN
INTRAVENOUS | Status: DC
  Administered 2018-09-10: 14:00:00 via INTRAVENOUS
  Filled 2018-09-10: qty 250

## 2018-09-10 MED ORDER — SODIUM CHLORIDE 0.9 % IV SOLN
510.0000 mg | Freq: Once | INTRAVENOUS | Status: AC
  Administered 2018-09-10: 14:00:00 510 mg via INTRAVENOUS
  Filled 2018-09-10: qty 17

## 2018-09-14 ENCOUNTER — Ambulatory Visit
Admission: RE | Admit: 2018-09-14 | Discharge: 2018-09-14 | Disposition: A | Discharge status: Home / Self Care | Payer: Medicare Other | Source: Ambulatory Visit | Attending: Internal Medicine | Admitting: Internal Medicine

## 2018-09-14 DIAGNOSIS — N179 Acute kidney failure, unspecified: Secondary | ICD-10-CM | POA: Diagnosis not present

## 2018-09-14 DIAGNOSIS — I10 Essential (primary) hypertension: Secondary | ICD-10-CM | POA: Diagnosis not present

## 2018-09-14 DIAGNOSIS — N183 Chronic kidney disease, stage 3 (moderate): Secondary | ICD-10-CM | POA: Diagnosis not present

## 2018-09-14 DIAGNOSIS — Z1231 Encounter for screening mammogram for malignant neoplasm of breast: Secondary | ICD-10-CM | POA: Diagnosis not present

## 2018-09-14 DIAGNOSIS — D631 Anemia in chronic kidney disease: Secondary | ICD-10-CM | POA: Diagnosis not present

## 2018-09-14 LAB — TSH: TSH: 0.9 (ref 0.41–5.90)

## 2018-09-14 LAB — BASIC METABOLIC PANEL
BUN: 31 — AB (ref 4–21)
Creatinine: 1.2 — AB (ref 0.5–1.1)
Glucose: 76
Potassium: 4.4 (ref 3.4–5.3)
Sodium: 139 (ref 137–147)

## 2018-09-14 LAB — HEPATIC FUNCTION PANEL
ALT: 20 (ref 7–35)
AST: 22 (ref 13–35)
Alkaline Phosphatase: 110 (ref 25–125)
Bilirubin, Total: 0.4

## 2018-09-14 LAB — CBC AND DIFFERENTIAL
HCT: 37 (ref 36–46)
Hemoglobin: 12.1 (ref 12.0–16.0)
Neutrophils Absolute: 3111
Platelets: 222 (ref 150–399)
WBC: 5.1

## 2018-09-15 ENCOUNTER — Ambulatory Visit: Payer: Medicare Other

## 2018-09-16 ENCOUNTER — Ambulatory Visit
Admission: RE | Admit: 2018-09-16 | Discharge: 2018-09-16 | Disposition: A | Discharge status: Home / Self Care | Payer: Medicare Other | Source: Ambulatory Visit | Attending: Gastroenterology | Admitting: Gastroenterology

## 2018-09-16 ENCOUNTER — Other Ambulatory Visit: Payer: Self-pay

## 2018-09-16 DIAGNOSIS — K7460 Unspecified cirrhosis of liver: Secondary | ICD-10-CM | POA: Diagnosis not present

## 2018-09-16 DIAGNOSIS — K743 Primary biliary cirrhosis: Secondary | ICD-10-CM | POA: Diagnosis not present

## 2018-09-16 DIAGNOSIS — D509 Iron deficiency anemia, unspecified: Secondary | ICD-10-CM | POA: Diagnosis not present

## 2018-09-16 DIAGNOSIS — K8309 Other cholangitis: Secondary | ICD-10-CM | POA: Diagnosis not present

## 2018-09-16 DIAGNOSIS — K754 Autoimmune hepatitis: Secondary | ICD-10-CM | POA: Diagnosis not present

## 2018-09-16 MED ORDER — GADOBUTROL 1 MMOL/ML IV SOLN
6.0000 mL | Freq: Once | INTRAVENOUS | Status: AC | PRN
  Administered 2018-09-16: 6 mL via INTRAVENOUS

## 2018-09-18 ENCOUNTER — Telehealth: Payer: Self-pay

## 2018-09-18 NOTE — Telephone Encounter (Signed)
Copied from [place] [phone]. Topic: General - Other >> Sep 18, 2018  2:21 PM Celene Kras A wrote: Reason for CRM: Pt called requesting lab results. Please advise.

## 2018-09-18 NOTE — Telephone Encounter (Signed)
Pt says TSH was tested by nephrology to send to our office. Followed by Dr Harlow Asa for thyroid. Told her I would reach out to nephrology on Monday and see if they faxed results to [place]

## 2018-09-21 NOTE — Telephone Encounter (Signed)
CMA is out of the office today. If she had labs done at another office, they can give her the results.

## 2018-09-21 NOTE — Telephone Encounter (Signed)
Pt calling to check status. Please advise    Cb#[phone]

## 2018-09-22 ENCOUNTER — Encounter: Payer: Self-pay | Admitting: *Deleted

## 2018-09-22 DIAGNOSIS — I5032 Chronic diastolic (congestive) heart failure: Secondary | ICD-10-CM

## 2018-09-22 NOTE — Telephone Encounter (Signed)
Left message for [facility] to fax over results

## 2018-09-24 NOTE — Telephone Encounter (Signed)
Called pt to let her know that I received results from nephrology. LMTCB

## 2018-09-25 ENCOUNTER — Telehealth: Payer: Self-pay | Admitting: *Deleted

## 2018-09-25 NOTE — Telephone Encounter (Signed)
Advised that labs were received and Dr Nicki Reaper will return on Monday.

## 2018-09-25 NOTE — Telephone Encounter (Signed)
Copied from [place] [phone]. Topic: General - Other >> Sep 25, 2018 11:21 AM Celene Kras A wrote: Reason for CRM: Pt returning call from [place]. She states it was about lab results. Attempted to reach office 3x. Please advise.

## 2018-09-28 ENCOUNTER — Other Ambulatory Visit: Payer: Medicare Other

## 2018-10-06 ENCOUNTER — Other Ambulatory Visit: Payer: Self-pay | Admitting: Internal Medicine

## 2018-10-08 ENCOUNTER — Telehealth: Payer: Self-pay | Admitting: *Deleted

## 2018-10-08 NOTE — Telephone Encounter (Signed)
Spoke with Ruth Roth today.  She does want to get back to class. Has not been out except to MD appointments.  She sees Dr Haroldine Laws in Sept.  Will wait until that appt to see how the program is doing and if he approves her return.

## 2018-10-12 DIAGNOSIS — N2581 Secondary hyperparathyroidism of renal origin: Secondary | ICD-10-CM | POA: Diagnosis not present

## 2018-10-12 DIAGNOSIS — D631 Anemia in chronic kidney disease: Secondary | ICD-10-CM | POA: Diagnosis not present

## 2018-10-12 DIAGNOSIS — I10 Essential (primary) hypertension: Secondary | ICD-10-CM | POA: Diagnosis not present

## 2018-10-12 DIAGNOSIS — N183 Chronic kidney disease, stage 3 (moderate): Secondary | ICD-10-CM | POA: Diagnosis not present

## 2018-10-13 ENCOUNTER — Other Ambulatory Visit: Payer: Self-pay

## 2018-10-15 ENCOUNTER — Other Ambulatory Visit: Payer: Self-pay

## 2018-10-15 ENCOUNTER — Encounter: Payer: Self-pay | Admitting: Internal Medicine

## 2018-10-15 ENCOUNTER — Ambulatory Visit (INDEPENDENT_AMBULATORY_CARE_PROVIDER_SITE_OTHER): Payer: Medicare Other | Admitting: Internal Medicine

## 2018-10-15 DIAGNOSIS — M545 Low back pain, unspecified: Secondary | ICD-10-CM

## 2018-10-15 DIAGNOSIS — N183 Chronic kidney disease, stage 3 unspecified: Secondary | ICD-10-CM

## 2018-10-15 DIAGNOSIS — E039 Hypothyroidism, unspecified: Secondary | ICD-10-CM

## 2018-10-15 DIAGNOSIS — K219 Gastro-esophageal reflux disease without esophagitis: Secondary | ICD-10-CM | POA: Diagnosis not present

## 2018-10-15 DIAGNOSIS — M810 Age-related osteoporosis without current pathological fracture: Secondary | ICD-10-CM | POA: Diagnosis not present

## 2018-10-15 DIAGNOSIS — K754 Autoimmune hepatitis: Secondary | ICD-10-CM | POA: Diagnosis not present

## 2018-10-15 DIAGNOSIS — D509 Iron deficiency anemia, unspecified: Secondary | ICD-10-CM

## 2018-10-15 DIAGNOSIS — E7800 Pure hypercholesterolemia, unspecified: Secondary | ICD-10-CM | POA: Diagnosis not present

## 2018-10-15 DIAGNOSIS — I4891 Unspecified atrial fibrillation: Secondary | ICD-10-CM | POA: Diagnosis not present

## 2018-10-15 DIAGNOSIS — K743 Primary biliary cirrhosis: Secondary | ICD-10-CM

## 2018-10-15 DIAGNOSIS — I5031 Acute diastolic (congestive) heart failure: Secondary | ICD-10-CM | POA: Diagnosis not present

## 2018-10-15 DIAGNOSIS — I10 Essential (primary) hypertension: Secondary | ICD-10-CM

## 2018-10-15 DIAGNOSIS — Z0000 Encounter for general adult medical examination without abnormal findings: Secondary | ICD-10-CM

## 2018-10-15 DIAGNOSIS — I25118 Atherosclerotic heart disease of native coronary artery with other forms of angina pectoris: Secondary | ICD-10-CM | POA: Diagnosis not present

## 2018-10-15 MED ORDER — TRAMADOL HCL 50 MG PO TABS: 50.0000 mg | ORAL_TABLET | Freq: Every day | ORAL | 0 refills | Status: DC | PRN

## 2018-10-15 MED ORDER — PANTOPRAZOLE SODIUM 20 MG PO TBEC: 20.0000 mg | DELAYED_RELEASE_TABLET | Freq: Every day | ORAL | 1 refills | Status: DC

## 2018-10-15 NOTE — Progress Notes (Signed)
[name], female   DOB: [birth year], 85 y.o.   MRN: [id number]   Subjective:    [name], female    DOB: [birth year], 85 y.o.   MRN: [id number]  HPI  Patient with past history of diastolic CHF, atrial fib, autoimmune hepatitis and hypertension.  She comes in today to follow up on these issues as well as for a complete physical exam.  She reports she is doing relatively well.  Being followed by hematology for IDA.  Receiving IV iron.  Receiving prolia.  Due f/u bone density 11/2019.  Reports no increased heart rate or palpitations.  No chest pain.  Breathing stable.  No abdominal pain.  Bowels moving.  Has seen Dr Sharlet Salina for back pain.  Takes tramadol.  Discussed referral to pain clinic.  Still agreeable for referral.  Needs tramadol to cover until gets pain clinic appt.  Acid reflux controlled on protonix.  Will decrease to 20mg  q day and see if continues to doing well on lower dose.    Past Medical History:  Diagnosis Date   (HFpEF) heart failure with preserved ejection fraction ([place])    a. 07/2017 Echo: EF 50-55%, no rwma, mild AS/AI, mod MR, mod dil LA, mod TR, PASP 17mmHg.   Autoimmune hepatitis ([place])    followed by Dr [name]   Fibrocystic breast disease    Glaucoma    Hypercholesterolemia    Hypertension    Hypothyroidism    Inflammatory arthritis    MI (myocardial infarction) ([place])    Neuropathy    Non-obstructive Coronary artery disease    a. 05/2014 NSTEMI/Cath: LM nl, LAD mild dzs, LCX 60ost hazy (? culprit), RCA mild dzs. EF 60% by echo-->Med Rx; b. 08/2017 Cath: LM 30ost, LAD min irregs, LCX small, 40ost/p, RCA large, min irregs, RPDA/RPL1 nl, EF 50-55%. 4+MR.   Osteoarthritis    Permanent atrial fibrillation    a. CHA2DS2VASc = 6-->Xarelto.   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 07/2017 Echo: Mod MR; b. 08/2017 Cath: 4+/Severe MR.   Past  Surgical History:  Procedure Laterality Date   ABDOMINAL HYSTERECTOMY  1963   partial, secondary to fibroids   APPENDECTOMY     Billroth     CARDIAC CATHETERIZATION  05/12/14   [place]   CARDIOVERSION N/A 12/26/2016   Procedure: CARDIOVERSION;  Surgeon: [name], MD;  Location: [facility] ORS;  Service: Cardiovascular;  Laterality: N/A;   CARDIOVERSION N/A 05/16/2017   Procedure: CARDIOVERSION;  Surgeon: [name], MD;  Location: [facility] ORS;  Service: Cardiovascular;  Laterality: N/A;   CARDIOVERSION N/A 06/09/2017   Procedure: CARDIOVERSION;  Surgeon: [name], MD;  Location: [facility];  Service: Cardiovascular;  Laterality: N/A;   CHOLECYSTECTOMY  1998   COLONOSCOPY  2009   RIGHT/LEFT HEART CATH AND CORONARY ANGIOGRAPHY Bilateral 08/21/2017   Procedure: RIGHT/LEFT HEART CATH AND CORONARY ANGIOGRAPHY;  Surgeon: [name], MD;  Location: [facility];  Service: Cardiovascular;  Laterality: Bilateral;   TEE WITHOUT CARDIOVERSION N/A 08/25/2017   Procedure: TRANSESOPHAGEAL ECHOCARDIOGRAM (TEE);  Surgeon: Jolaine Artist, MD;  Location[facility];  Service: Cardiovascular;  Laterality: N/A;   UPPER GI ENDOSCOPY  2004   Family History  Problem Relation Age of Onset   Stroke Mother    Esophageal cancer Brother        also had lung cancer  Ovarian cancer Daughter    Colon cancer Daughter    Breast cancer Neg Hx    Social History   Socioeconomic History   Marital status: Widowed    Spouse name: Not on file   Number of children: Not on file   Years of education: Not on file   Highest education level: Not on file  Occupational History   Not on file  Social Needs   Financial resource strain: Not hard at all   Food insecurity    Worry: Never true    Inability: Never true   Transportation needs    Medical: No    Non-medical: No  Tobacco Use   Smoking status: Never Smoker   Smokeless tobacco: Never Used  Substance and  Sexual Activity   Alcohol use: No    Alcohol/week: 0.0 standard drinks   Drug use: No   Sexual activity: Never  Lifestyle   Physical activity    Days per week: 3 days    Minutes per session: 40 min   Stress: Not at all  Relationships   Social connections    Talks on phone: Not on file    Gets together: Not on file    Attends religious service: Not on file    Active member of club or organization: Not on file    Attends meetings of clubs or organizations: Not on file    Relationship status: Not on file  Other Topics Concern   Not on file  Social History Narrative   Not on file    Outpatient Encounter Medications as of 10/15/2018  Medication Sig   brimonidine-timolol (COMBIGAN) 0.2-0.5 % ophthalmic solution Place 1 drop into the left eye 2 (two) times daily.    calcitRIOL (ROCALTROL) 0.25 MCG capsule Take 0.25 mcg by mouth daily.    dorzolamide (TRUSOPT) 2 % ophthalmic solution Place 1 drop into the left eye 2 (two) times daily.   fluticasone (FLONASE) 50 MCG/ACT nasal spray Place 2 sprays into both nostrils daily as needed for allergies.    gabapentin (NEURONTIN) 300 MG capsule TAKE 2 CAPSULES BY MOUTH FOUR TIMES DAILY   ipratropium (ATROVENT) 0.06 % nasal spray Place 2 sprays into both nostrils daily as needed for rhinitis.    latanoprost (XALATAN) 0.005 % ophthalmic solution APPLY 1 DROP(S) IN BOTH EYES ONCE A DAY   levothyroxine (SYNTHROID) 125 MCG tablet TAKE 1 TABLET BY MOUTH EVERY DAY BEFORE BREAKFAST (Patient taking differently: Take 112 mcg by mouth daily before breakfast. )   losartan (COZAAR) 25 MG tablet Take 25 mg by mouth daily.    metoprolol tartrate (LOPRESSOR) 50 MG tablet TAKE 1 TABLET(50 MG) BY MOUTH TWICE DAILY   Multiple Vitamin (MULTI-VITAMIN) tablet Take by mouth.   Rivaroxaban (XARELTO) 15 MG TABS tablet Take 1 tablet (15 mg total) by mouth daily with supper.   torsemide (DEMADEX) 20 MG tablet Take 1 tablet (20 mg total) by mouth daily.     traMADol (ULTRAM) 50 MG tablet Take 1 tablet (50 mg total) by mouth daily as needed (for pain.).   ursodiol (ACTIGALL) 300 MG capsule Take 300-600 mg by mouth See admin instructions. Take 2 capsules (600 mg) daily after lunch & 1 capsule (300 mg) at night.   [DISCONTINUED] pantoprazole (PROTONIX) 40 MG tablet TAKE 1 TABLET BY MOUTH EVERY EVENING   [DISCONTINUED] traMADol (ULTRAM) 50 MG tablet Take 1 tablet (50 mg total) by mouth daily as needed (for pain.).   pantoprazole (PROTONIX) 20 MG tablet Take  1 tablet (20 mg total) by mouth daily.   No facility-administered encounter medications on file as of 10/15/2018.     Review of Systems  Constitutional: Negative for appetite change and unexpected weight change.  HENT: Negative for congestion and sinus pressure.   Eyes: Negative for pain and visual disturbance.  Respiratory: Negative for cough, chest tightness and shortness of breath.   Cardiovascular: Negative for chest pain, palpitations and leg swelling.  Gastrointestinal: Negative for abdominal pain, diarrhea, nausea and vomiting.  Genitourinary: Negative for difficulty urinating and dysuria.  Musculoskeletal: Positive for back pain. Negative for joint swelling and myalgias.  Skin: Negative for color change and rash.  Neurological: Negative for dizziness, light-headedness and headaches.  Hematological: Negative for adenopathy. Does not bruise/bleed easily.  Psychiatric/Behavioral: Negative for agitation and dysphoric mood.       Objective:    Physical Exam Constitutional:      General: She is not in acute distress.    Appearance: Normal appearance. She is well-developed.  HENT:     Right Ear: External ear normal.     Left Ear: External ear normal.  Eyes:     General: No scleral icterus.       Right eye: No discharge.        Left eye: No discharge.     Conjunctiva/sclera: Conjunctivae normal.  Neck:     Musculoskeletal: Neck supple. No muscular tenderness.     Thyroid:  No thyromegaly.  Cardiovascular:     Rate and Rhythm: Normal rate.     Comments: Rate controlled.   Pulmonary:     Effort: No tachypnea, accessory muscle usage or respiratory distress.     Breath sounds: Normal breath sounds. No decreased breath sounds or wheezing.  Chest:     Breasts:        Right: No inverted nipple, mass, nipple discharge or tenderness (no axillary adenopathy).        Left: No inverted nipple, mass, nipple discharge or tenderness (no axilarry adenopathy).  Abdominal:     General: Bowel sounds are normal.     Palpations: Abdomen is soft.     Tenderness: There is no abdominal tenderness.  Musculoskeletal:        General: No swelling or tenderness.  Lymphadenopathy:     Cervical: No cervical adenopathy.  Skin:    Findings: No erythema or rash.  Neurological:     Mental Status: She is alert and oriented to person, place, and time.  Psychiatric:        Mood and Affect: Mood normal.        Behavior: Behavior normal.     BP 124/72 (BP Location: Left Arm, Patient Position: Sitting, Cuff Size: Normal)    Pulse 74    Temp 98.2 F (36.8 C) (Oral)    Resp 15    Ht 5' 2.5" (1.588 m)    Wt 138 lb 9.6 oz (62.9 kg)    SpO2 97%    BMI 24.95 kg/m  Wt Readings from Last 3 Encounters:  10/15/18 138 lb 9.6 oz (62.9 kg)  09/04/18 139 lb 4.8 oz (63.2 kg)  07/07/18 135 lb (61.2 kg)     Lab Results  Component Value Date   WBC 5.1 09/14/2018   HGB 12.1 09/14/2018   HCT 37 09/14/2018   PLT 222 09/14/2018   GLUCOSE 80 08/07/2018   CHOL 197 07/22/2018   TRIG 175.0 (H) 07/22/2018   HDL 51.60 07/22/2018   LDLDIRECT 143.5 12/17/2012  LDLCALC 111 (H) 07/22/2018   ALT 20 09/14/2018   AST 22 09/14/2018   NA 139 09/14/2018   K 4.4 09/14/2018   CL 109 08/07/2018   CREATININE 1.2 (A) 09/14/2018   BUN 31 (A) 09/14/2018   CO2 23 08/07/2018   TSH 0.90 09/14/2018   INR 1.63 12/29/2017   HGBA1C 5.6 01/27/2018    Mr Abdomen Mrcp W Wo Contast  Result Date: 09/16/2018 CLINICAL  DATA:  Autoimmune hepatitis, primary biliary cholangitis and hepatic cirrhosis. Routine liver screening. Remote cholecystectomy. EXAM: MRI ABDOMEN WITHOUT AND WITH CONTRAST (INCLUDING MRCP) TECHNIQUE: Multiplanar multisequence MR imaging of the abdomen was performed both before and after the administration of intravenous contrast. Heavily T2-weighted images of the biliary and pancreatic ducts were obtained, and three-dimensional MRCP images were rendered by post processing. CONTRAST:  6 cc MultiHance IV. COMPARISON:  04/22/2014 CT abdomen/pelvis. 01/07/2012 MRI abdomen. 06/17/2018 abdominal sonogram. FINDINGS: Lower chest: No acute abnormality at the lung bases. Hepatobiliary: Finely lobulated liver surface, compatible with cirrhosis. There is diffuse hepatic hemosiderosis. No liver mass. Cholecystectomy. Mild diffuse central intrahepatic biliary ductal dilatation and dilated extrahepatic bile ducts, minimally increased since 01/07/2012 MRI. Common bile duct diameter 9 mm, previously 8 mm. No biliary strictures or filling defects. No biliary or ampullary masses. No beading of the bile ducts. Pancreas: No pancreatic mass or duct dilation.  No pancreas divisum. Spleen: Normal size spleen. Diffuse splenic hemosiderosis. Numerous subcentimeter lesions scattered throughout the spleen, compatible with Debarah Crape bodies. Adrenals/Urinary Tract: Normal adrenals. No hydronephrosis. Simple exophytic 5.0 cm upper right renal cyst. No suspicious renal masses. Stomach/Bowel: Normal non-distended stomach. Visualized small and large bowel is normal caliber, with no bowel wall thickening. Vascular/Lymphatic: Atherosclerotic nonaneurysmal abdominal aorta. Patent portal, splenic, hepatic and renal veins. No pathologically enlarged lymph nodes in the abdomen. Other: No abdominal ascites or focal fluid collection. Musculoskeletal: No aggressive appearing focal osseous lesions. Chronic mild L2 vertebral compression deformity.  IMPRESSION: 1. Subtle morphologic changes in the liver compatible with the reported history of hepatic cirrhosis. No liver masses. 2. 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 3. Bile ducts are within normal post cholecystectomy limits. No choledocholithiasis. 4.  Aortic Atherosclerosis (ICD10-I70.0). Electronically Signed   By: Ilona Sorrel M.D.   On: 09/16/2018 11:08       Assessment & Plan:   Problem List Items Addressed This Visit    Acute diastolic CHF (congestive heart failure) (HCC)    No increased sob or swelling.  Doing well on current medication regimen.  Breathing stable.  Continue f/u with cardiology.  Doing well on three days per week - torsemide.  Follow metabolic panel.       Relevant Medications   losartan (COZAAR) 25 MG tablet   Anemia    Has iron deficient anemia.  Seeing hematology.  Received IV iron.        Atrial fibrillation ([place])    On xarelto.  Followed by cardiology.  Stable.        Relevant Medications   losartan (COZAAR) 25 MG tablet   Autoimmune hepatitis ([place])    Followed by GI.  Liver function tests are wnl.        Back pain    Previously saw Dr Sharlet Salina.  Refilled tramadol today.  Get her in with pain clinic.        Relevant Medications   traMADol (ULTRAM) 50 MG tablet   CKD (chronic kidney disease), stage III (HCC)    Abdominal ultrasound - no hydronephrosis  or other acute abnormality.  Follow metabolic apnel.        Coronary artery disease    Followed by cardiology.  Stable.        Relevant Medications   losartan (COZAAR) 25 MG tablet   GERD (gastroesophageal reflux disease)    Controlled.        Relevant Medications   pantoprazole (PROTONIX) 20 MG tablet   Health care maintenance    Physical today 10/16/18.  Mammogram 09/14/18 - Birads I.  Colonoscopy 03/2005 - normal.  Continues to follow up with GI.        Hypercholesterolemia    Unable to take statin medication.  Low choleserol diet and exercise.  Follow lipid panel.         Relevant Medications   losartan (COZAAR) 25 MG tablet   Other Relevant Orders   Hepatic function panel   Lipid panel   Hypertension    Blood pressure under good control.  Continue same medication regimen.  Follow pressures.  Follow metabolic panel.        Relevant Medications   losartan (COZAAR) 25 MG tablet   Other Relevant Orders   Basic metabolic panel   Hypothyroidism    On thyroid replacement.  Follow tsh.        Osteoporosis    Has received prolia.  Due bone density 11/2019.        Primary biliary cholangitis ([place])    Being followed by GI.            Einar Pheasant, MD

## 2018-10-18 ENCOUNTER — Encounter: Payer: Self-pay | Admitting: Internal Medicine

## 2018-10-18 NOTE — Assessment & Plan Note (Signed)
Physical today 10/16/18.  Mammogram 09/14/18 - Birads I.  Colonoscopy 03/2005 - normal.  Continues to follow up with GI.

## 2018-10-18 NOTE — Assessment & Plan Note (Signed)
Followed by cardiology. Stable.   

## 2018-10-18 NOTE — Assessment & Plan Note (Signed)
Abdominal ultrasound - no hydronephrosis or other acute abnormality.  Follow metabolic apnel.

## 2018-10-18 NOTE — Assessment & Plan Note (Signed)
On xarelto.  Followed by cardiology.  Stable.

## 2018-10-18 NOTE — Assessment & Plan Note (Signed)
Being followed by GI 

## 2018-10-18 NOTE — Assessment & Plan Note (Signed)
Previously saw Dr Sharlet Salina.  Refilled tramadol today.  Get her in with pain clinic.

## 2018-10-18 NOTE — Assessment & Plan Note (Signed)
Has iron deficient anemia.  Seeing hematology.  Received IV iron.

## 2018-10-18 NOTE — Assessment & Plan Note (Signed)
Unable to take statin medication.  Low choleserol diet and exercise.  Follow lipid panel.

## 2018-10-18 NOTE — Assessment & Plan Note (Signed)
No increased sob or swelling.  Doing well on current medication regimen.  Breathing stable.  Continue f/u with cardiology.  Doing well on three days per week - torsemide.  Follow metabolic panel.

## 2018-10-18 NOTE — Assessment & Plan Note (Signed)
Followed by GI.  Liver function tests are wnl.

## 2018-10-18 NOTE — Assessment & Plan Note (Signed)
Has received prolia.  Due bone density 11/2019.

## 2018-10-18 NOTE — Assessment & Plan Note (Signed)
Blood pressure under good control.  Continue same medication regimen.  Follow pressures.  Follow metabolic panel.   

## 2018-10-18 NOTE — Assessment & Plan Note (Signed)
On thyroid replacement.  Follow tsh.  

## 2018-10-18 NOTE — Assessment & Plan Note (Signed)
Controlled.  

## 2018-10-22 DIAGNOSIS — N183 Chronic kidney disease, stage 3 (moderate): Secondary | ICD-10-CM | POA: Diagnosis not present

## 2018-10-26 ENCOUNTER — Other Ambulatory Visit: Payer: Self-pay

## 2018-10-26 MED ORDER — RIVAROXABAN 15 MG PO TABS: 15.0000 mg | ORAL_TABLET | Freq: Every day | ORAL | 6 refills | Status: DC

## 2018-11-04 ENCOUNTER — Ambulatory Visit: Payer: Medicare Other | Admitting: Nurse Practitioner

## 2018-11-19 ENCOUNTER — Ambulatory Visit (INDEPENDENT_AMBULATORY_CARE_PROVIDER_SITE_OTHER): Payer: Medicare Other | Admitting: Cardiovascular Disease

## 2018-11-19 ENCOUNTER — Encounter: Payer: Self-pay | Admitting: Cardiovascular Disease

## 2018-11-19 ENCOUNTER — Other Ambulatory Visit: Payer: Self-pay

## 2018-11-19 VITALS — BP 138/12 | HR 116 | Ht 62.0 in | Wt 138.8 lb

## 2018-11-19 DIAGNOSIS — I5032 Chronic diastolic (congestive) heart failure: Secondary | ICD-10-CM

## 2018-11-19 DIAGNOSIS — I340 Nonrheumatic mitral (valve) insufficiency: Secondary | ICD-10-CM | POA: Diagnosis not present

## 2018-11-19 DIAGNOSIS — I10 Essential (primary) hypertension: Secondary | ICD-10-CM

## 2018-11-19 DIAGNOSIS — I4891 Unspecified atrial fibrillation: Secondary | ICD-10-CM

## 2018-11-19 DIAGNOSIS — I25118 Atherosclerotic heart disease of native coronary artery with other forms of angina pectoris: Secondary | ICD-10-CM | POA: Diagnosis not present

## 2018-11-19 MED ORDER — AMIODARONE HCL 200 MG PO TABS: 200.0000 mg | ORAL_TABLET | Freq: Two times a day (BID) | ORAL | 3 refills | Status: DC

## 2018-11-19 NOTE — H&P (View-Only) (Signed)
Cardiology Office Note   Date:  11/19/2018   ID:  Ruth Roth, DOB 1928-04-04, MRN [id number]  PCP:  Einar Pheasant, MD  Cardiologist:   [name], MD   Chief Complaint  Patient presents with  . other    4 month f/u c/o sob. Meds reviewed verbally with pt.      History of Present Illness: Ruth Roth is a 85 y.o. female who presents for a follow-up visit regarding coronary artery disease, chronic diastolic heart failure, mitral regurgitation and persistent atrial fibrillation .  She has known history of hypertension, peptic ulcer disease, fibrocystic breast disease, inflammatory arthritis, autoimmune hepatitis and neuropathy.  She had a small non-ST elevation myocardial infarction in March 2016 with acute diastolic heart failure after a GI illness.  Echocardiogram showed normal LV systolic function. Cardiac catheterization showed showed 60% ostial left circumflex hazy stenosis which was possibly the culprit. Mild LAD/RCA disease. She was treated medically.   She had 3 failed cardioversions in 2018/2019. She had worsening heart failure 2019.  Right and left cardiac catheterization in June 2019 showed  showed mild nonobstructive coronary artery disease.  Ejection fraction was 50 to 55% with severe mitral regurgitation.  Right heart catheterization showed severely elevated filling pressures with moderate to severe pulmonary hypertension and severely reduced cardiac output. I transferred the patient to [facility] where she was effectively diuresed with milrinone with subsequent improvement in symptoms.  She had a transesophageal echocardiogram done which showed normal LV systolic function with only mild mitral regurgitation.  The mitral regurgitation was felt to be functional with subsequent improvement after diuresis.  She was subsequently noted to be in sinus rhythm even without an antiarrhythmic medication.   She is noted to be in A. fib with RVR today.  She did notice recent  palpitations and worsening exertional dyspnea.  No chest pain.  She has been taking her medications regularly.     Past Medical History:  Diagnosis Date  . (HFpEF) heart failure with preserved ejection fraction ([place])    a. 07/2017 Echo: EF 50-55%, no rwma, mild AS/AI, mod MR, mod dil LA, mod TR, PASP 53mmHg.  . Autoimmune hepatitis ([place])    followed by Dr [name]  . Fibrocystic breast disease   . Glaucoma   . Hypercholesterolemia   . Hypertension   . Hypothyroidism   . Inflammatory arthritis   . MI (myocardial infarction) ([place])   . Neuropathy   . Non-obstructive Coronary artery disease    a. 05/2014 NSTEMI/Cath: LM nl, LAD mild dzs, LCX 60ost hazy (? culprit), RCA mild dzs. EF 60% by echo-->Med Rx; b. 08/2017 Cath: LM 30ost, LAD min irregs, LCX small, 40ost/p, RCA large, min irregs, RPDA/RPL1 nl, EF 50-55%. 4+MR.  . Osteoarthritis   . Permanent atrial fibrillation    a. CHA2DS2VASc = 6-->Xarelto.  . 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 Severe mitral regurgitation    a. 07/2017 Echo: Mod MR; b. 08/2017 Cath: 4+/Severe MR.    Past Surgical History:  Procedure Laterality Date  . ABDOMINAL HYSTERECTOMY  1963   partial, secondary to fibroids  . APPENDECTOMY    . Billroth    . CARDIAC CATHETERIZATION  05/12/14   [facility]  . CARDIOVERSION N/A 12/26/2016   Procedure: CARDIOVERSION;  Surgeon: [name], MD;  Location: [facility] ORS;  Service: Cardiovascular;  Laterality: N/A;  . CARDIOVERSION N/A 05/16/2017   Procedure: CARDIOVERSION;  Surgeon: [name]  A, MD;  Location: [facility] ORS;  Service: Cardiovascular;  Laterality: N/A;  . CARDIOVERSION N/A 06/09/2017   Procedure: CARDIOVERSION;  Surgeon: [name], MD;  Location: [facility] ORS;  Service: Cardiovascular;  Laterality: N/A;  . CHOLECYSTECTOMY  1998  . COLONOSCOPY  2009  . RIGHT/LEFT HEART CATH AND CORONARY ANGIOGRAPHY Bilateral 08/21/2017   Procedure: RIGHT/LEFT HEART  CATH AND CORONARY ANGIOGRAPHY;  Surgeon: [name], MD;  Location: [place] CV LAB;  Service: Cardiovascular;  Laterality: Bilateral;  . TEE WITHOUT CARDIOVERSION N/A 08/25/2017   Procedure: TRANSESOPHAGEAL ECHOCARDIOGRAM (TEE);  Surgeon: Jolaine Artist, MD;  Location: [facility] ENDOSCOPY;  Service: Cardiovascular;  Laterality: N/A;  . UPPER GI ENDOSCOPY  2004     Current Outpatient Medications  Medication Sig Dispense Refill  . brimonidine-timolol (COMBIGAN) 0.2-0.5 % ophthalmic solution Place 1 drop into the left eye 2 (two) times daily.     . calcitRIOL (ROCALTROL) 0.25 MCG capsule Take 0.25 mcg by mouth daily.     . dorzolamide (TRUSOPT) 2 % ophthalmic solution Place 1 drop into the left eye 2 (two) times daily.    . fluticasone (FLONASE) 50 MCG/ACT nasal spray Place 2 sprays into both nostrils daily as needed for allergies.     Marland Kitchen gabapentin (NEURONTIN) 300 MG capsule TAKE 2 CAPSULES BY MOUTH FOUR TIMES DAILY 720 capsule 0  . ipratropium (ATROVENT) 0.06 % nasal spray Place 2 sprays into both nostrils daily as needed for rhinitis.     Marland Kitchen latanoprost (XALATAN) 0.005 % ophthalmic solution APPLY 1 DROP(S) IN BOTH EYES ONCE A DAY    . levothyroxine (SYNTHROID) 112 MCG tablet Take 112 mcg by mouth daily before breakfast.    . losartan (COZAAR) 25 MG tablet Take 25 mg by mouth daily.     . metoprolol tartrate (LOPRESSOR) 50 MG tablet TAKE 1 TABLET(50 MG) BY MOUTH TWICE DAILY 180 tablet 3  . Multiple Vitamin (MULTI-VITAMIN) tablet Take by mouth.    . pantoprazole (PROTONIX) 20 MG tablet Take 1 tablet (20 mg total) by mouth daily. 90 tablet 1  . Rivaroxaban (XARELTO) 15 MG TABS tablet Take 1 tablet (15 mg total) by mouth daily with supper. 30 tablet 6  . torsemide (DEMADEX) 20 MG tablet Take 1 tablet (20 mg total) by mouth daily. (Patient taking differently: Take 20 mg by mouth every other day. ) 30 tablet 3  . traMADol (ULTRAM) 50 MG tablet Take 1 tablet (50 mg total) by mouth daily as  needed (for pain.). 30 tablet 0  . ursodiol (ACTIGALL) 300 MG capsule Take 300-600 mg by mouth See admin instructions. Take 2 capsules (600 mg) daily after lunch & 1 capsule (300 mg) at night.     No current facility-administered medications for this visit.     Allergies:   Statins, Haldol [haloperidol lactate], Librax [chlordiazepoxide-clidinium], Plavix [clopidogrel bisulfate], and Ramipril    Social History:  The patient  reports that she has never smoked. She has never used smokeless tobacco. She reports that she does not drink alcohol or use drugs.   Family History:  The patient's family history includes Colon cancer in her daughter; Esophageal cancer in her brother; Ovarian cancer in her daughter; Stroke in her mother.    ROS:  Please see the history of present illness.   Otherwise, review of systems are positive for none.   All other systems are reviewed and negative.    PHYSICAL EXAM: VS:  BP (!) 138/12 (BP Location: Left Arm, Patient Position: Sitting,  Cuff Size: Normal)   Pulse (!) 116   Ht 5\' 2"  (1.575 m)   Wt 138 lb 12 oz (62.9 kg)   SpO2 98%   BMI 25.38 kg/m  , BMI Body mass index is 25.38 kg/m. GEN: Well nourished, well developed, in no acute distress  HEENT: normal  Neck: No JVD, carotid bruits, or masses Cardiac: Irregularly irregular and tachycardic; no rubs, or gallops,no edema . 1/ 6 systolic ejection murmur at the aortic area.  Trace bilateral leg edema Respiratory: Clear to auscultation GI: soft, nontender, nondistended, + BS MS: no deformity or atrophy  Skin: warm and dry, no rash Neuro:  Strength and sensation are intact Psych: euthymic mood, full affect   EKG:  EKG is ordered today. The ekg ordered today demonstrates A. fib with RVR with nonspecific ST changes.  Recent Labs: 02/11/2018: B Natriuretic Peptide 713.5 09/14/2018: ALT 20; BUN 31; Creatinine 1.2; Hemoglobin 12.1; Platelets 222; Potassium 4.4; Sodium 139; TSH 0.90    Lipid Panel     Component Value Date/Time   CHOL 197 07/22/2018 0945   TRIG 175.0 (H) 07/22/2018 0945   HDL 51.60 07/22/2018 0945   CHOLHDL 4 07/22/2018 0945   VLDL 35.0 07/22/2018 0945   LDLCALC 111 (H) 07/22/2018 0945   LDLDIRECT 143.5 12/17/2012 0955      Wt Readings from Last 3 Encounters:  11/19/18 138 lb 12 oz (62.9 kg)  10/15/18 138 lb 9.6 oz (62.9 kg)  09/04/18 139 lb 4.8 oz (63.2 kg)       ASSESSMENT AND PLAN:  1.  Persistent atrial fibrillation: She was in sinus rhythm up until recently but she is in A. fib with RVR today.  She has noticed recent increased palpitations and exertional dyspnea.  She did have failed cardioversion in the past but she was significantly volume overloaded at that time.  I am hoping to get her back in sinus rhythm with an antiarrhythmic medication.  I added amiodarone 200 mg twice daily with plans for cardioversion in 10 days.  Continue metoprolol and Xarelto.  She has not missed any dose of Xarelto.   She is aware of the procedure and risks and benefits as she had it before.  2. Chronic diastolic heart failure: She appears to be euvolemic on small dose torsemide.  Her weight has been stable.  This is in spite of being in A. fib with RVR.  3. Essential hypertension: Blood pressure is controlled on current medications.  4.  Functional mitral regurgitation: Severe MR was only in the setting of severe volume overload.  MR was only mild on most recent transesophageal echo.   Disposition:   FU with me in 2 months.  Signed,  [name], MD  11/19/2018 2:01 PM    [facility]

## 2018-11-19 NOTE — Patient Instructions (Signed)
Medication Instructions:  Your physician has recommended you make the following change in your medication:   START Amiodarone 200 mg twice daily. An Rx has been sent to your pharmacy   If you need a refill on your cardiac medications before your next appointment, please call your pharmacy.   Lab work: Risk manager today   You will need a COVID-19 test prior to the procedure. Please report to [facility] medical arts building on 11/26/18 between 12:30-2:30pm.   If you have labs (blood work) drawn today and your tests are completely normal, you will receive your results only by: Marland Kitchen MyChart Message (if you have MyChart) OR . A paper copy in the mail If you have any lab test that is abnormal or we need to change your treatment, we will call you to review the results.  Testing/Procedures: Your physician has recommended that you have a Cardioversion (DCCV). Electrical Cardioversion uses a jolt of electricity to your heart either through paddles or wired patches attached to your chest. This is a controlled, usually prescheduled, procedure. Defibrillation is done under light anesthesia in the hospital, and you usually go home the day of the procedure. This is done to get your heart back into a normal rhythm. You are not awake for the procedure. Please see the instruction sheet given to you today.    Follow-Up: At [facility], you and your health needs are our priority.  As part of our continuing mission to provide you with exceptional heart care, we have created designated Provider Care Teams.  These Care Teams include your primary Cardiologist (physician) and Advanced Practice Providers (APPs -  Physician Assistants and Nurse Practitioners) who all work together to provide you with the care you need, when you need it. You will need a follow up appointment in 2 months. You may see [name], MD or one of the following Advanced Practice Providers on your designated Care Team:   Murray Hodgkins,  NP Christell Faith, PA-C . Marrianne Mood, PA-C  Any Other Special Instructions Will Be Listed Below (If Applicable).  You are scheduled for a Cardioversion on _9/21/20___ with Dr._Arida_ Please arrive at the [facility] at _________ a.m. on the day of your procedure.  We will call you with a procedure time  DIET INSTRUCTIONS:  Nothing to eat or drink after midnight except your medications with a              sip of water.         1) Labs:Today bmet and cbc. You will need a COVID test prior to the procedure.  2) Medications:  YOU MAY TAKE ALL of your remaining medications with a small amount of water.  3) Must have a responsible person to drive you home.  4) Bring a current list of your medications and current insurance cards.    If you have any questions after you get home, please call the office at [phone]

## 2018-11-19 NOTE — Progress Notes (Signed)
Cardiology Office Note   Date:  11/19/2018   ID:  Ruth Roth, DOB 07/15/1928, MRN [id number]  PCP:  Einar Pheasant, MD  Cardiologist:   [name], MD   Chief Complaint  Patient presents with  . other    4 month f/u c/o sob. Meds reviewed verbally with pt.      History of Present Illness: Ruth Roth is a 85 y.o. female who presents for a follow-up visit regarding coronary artery disease, chronic diastolic heart failure, mitral regurgitation and persistent atrial fibrillation .  She has known history of hypertension, peptic ulcer disease, fibrocystic breast disease, inflammatory arthritis, autoimmune hepatitis and neuropathy.  She had a small non-ST elevation myocardial infarction in March 2016 with acute diastolic heart failure after a GI illness.  Echocardiogram showed normal LV systolic function. Cardiac catheterization showed showed 60% ostial left circumflex hazy stenosis which was possibly the culprit. Mild LAD/RCA disease. She was treated medically.   She had 3 failed cardioversions in 2018/2019. She had worsening heart failure 2019.  Right and left cardiac catheterization in June 2019 showed  showed mild nonobstructive coronary artery disease.  Ejection fraction was 50 to 55% with severe mitral regurgitation.  Right heart catheterization showed severely elevated filling pressures with moderate to severe pulmonary hypertension and severely reduced cardiac output. I transferred the patient to [facility] where she was effectively diuresed with milrinone with subsequent improvement in symptoms.  She had a transesophageal echocardiogram done which showed normal LV systolic function with only mild mitral regurgitation.  The mitral regurgitation was felt to be functional with subsequent improvement after diuresis.  She was subsequently noted to be in sinus rhythm even without an antiarrhythmic medication.   She is noted to be in A. fib with RVR today.  She did notice recent  palpitations and worsening exertional dyspnea.  No chest pain.  She has been taking her medications regularly.     Past Medical History:  Diagnosis Date  . (HFpEF) heart failure with preserved ejection fraction ([place])    a. 07/2017 Echo: EF 50-55%, no rwma, mild AS/AI, mod MR, mod dil LA, mod TR, PASP 65mmHg.  . Autoimmune hepatitis ([place])    followed by Dr [name]  . Fibrocystic breast disease   . Glaucoma   . Hypercholesterolemia   . Hypertension   . Hypothyroidism   . Inflammatory arthritis   . MI (myocardial infarction) ([place])   . Neuropathy   . Non-obstructive Coronary artery disease    a. 05/2014 NSTEMI/Cath: LM nl, LAD mild dzs, LCX 60ost hazy (? culprit), RCA mild dzs. EF 60% by echo-->Med Rx; b. 08/2017 Cath: LM 30ost, LAD min irregs, LCX small, 40ost/p, RCA large, min irregs, RPDA/RPL1 nl, EF 50-55%. 4+MR.  . Osteoarthritis   . Permanent atrial fibrillation    a. CHA2DS2VASc = 6-->Xarelto.  . 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 Severe mitral regurgitation    a. 07/2017 Echo: Mod MR; b. 08/2017 Cath: 4+/Severe MR.    Past Surgical History:  Procedure Laterality Date  . ABDOMINAL HYSTERECTOMY  1963   partial, secondary to fibroids  . APPENDECTOMY    . Billroth    . CARDIAC CATHETERIZATION  05/12/14   [facility]  . CARDIOVERSION N/A 12/26/2016   Procedure: CARDIOVERSION;  Surgeon: [name], MD;  Location: [facility] ORS;  Service: Cardiovascular;  Laterality: N/A;  . CARDIOVERSION N/A 05/16/2017   Procedure: CARDIOVERSION;  Surgeon: [name]  A, MD;  Location: [facility] ORS;  Service: Cardiovascular;  Laterality: N/A;  . CARDIOVERSION N/A 06/09/2017   Procedure: CARDIOVERSION;  Surgeon: [name], MD;  Location: [facility] ORS;  Service: Cardiovascular;  Laterality: N/A;  . CHOLECYSTECTOMY  1998  . COLONOSCOPY  2009  . RIGHT/LEFT HEART CATH AND CORONARY ANGIOGRAPHY Bilateral 08/21/2017   Procedure: RIGHT/LEFT HEART  CATH AND CORONARY ANGIOGRAPHY;  Surgeon: [name], MD;  Location: [place] CV LAB;  Service: Cardiovascular;  Laterality: Bilateral;  . TEE WITHOUT CARDIOVERSION N/A 08/25/2017   Procedure: TRANSESOPHAGEAL ECHOCARDIOGRAM (TEE);  Surgeon: Jolaine Artist, MD;  Location: [facility] ENDOSCOPY;  Service: Cardiovascular;  Laterality: N/A;  . UPPER GI ENDOSCOPY  2004     Current Outpatient Medications  Medication Sig Dispense Refill  . brimonidine-timolol (COMBIGAN) 0.2-0.5 % ophthalmic solution Place 1 drop into the left eye 2 (two) times daily.     . calcitRIOL (ROCALTROL) 0.25 MCG capsule Take 0.25 mcg by mouth daily.     . dorzolamide (TRUSOPT) 2 % ophthalmic solution Place 1 drop into the left eye 2 (two) times daily.    . fluticasone (FLONASE) 50 MCG/ACT nasal spray Place 2 sprays into both nostrils daily as needed for allergies.     Marland Kitchen gabapentin (NEURONTIN) 300 MG capsule TAKE 2 CAPSULES BY MOUTH FOUR TIMES DAILY 720 capsule 0  . ipratropium (ATROVENT) 0.06 % nasal spray Place 2 sprays into both nostrils daily as needed for rhinitis.     Marland Kitchen latanoprost (XALATAN) 0.005 % ophthalmic solution APPLY 1 DROP(S) IN BOTH EYES ONCE A DAY    . levothyroxine (SYNTHROID) 112 MCG tablet Take 112 mcg by mouth daily before breakfast.    . losartan (COZAAR) 25 MG tablet Take 25 mg by mouth daily.     . metoprolol tartrate (LOPRESSOR) 50 MG tablet TAKE 1 TABLET(50 MG) BY MOUTH TWICE DAILY 180 tablet 3  . Multiple Vitamin (MULTI-VITAMIN) tablet Take by mouth.    . pantoprazole (PROTONIX) 20 MG tablet Take 1 tablet (20 mg total) by mouth daily. 90 tablet 1  . Rivaroxaban (XARELTO) 15 MG TABS tablet Take 1 tablet (15 mg total) by mouth daily with supper. 30 tablet 6  . torsemide (DEMADEX) 20 MG tablet Take 1 tablet (20 mg total) by mouth daily. (Patient taking differently: Take 20 mg by mouth every other day. ) 30 tablet 3  . traMADol (ULTRAM) 50 MG tablet Take 1 tablet (50 mg total) by mouth daily as  needed (for pain.). 30 tablet 0  . ursodiol (ACTIGALL) 300 MG capsule Take 300-600 mg by mouth See admin instructions. Take 2 capsules (600 mg) daily after lunch & 1 capsule (300 mg) at night.     No current facility-administered medications for this visit.     Allergies:   Statins, Haldol [haloperidol lactate], Librax [chlordiazepoxide-clidinium], Plavix [clopidogrel bisulfate], and Ramipril    Social History:  The patient  reports that she has never smoked. She has never used smokeless tobacco. She reports that she does not drink alcohol or use drugs.   Family History:  The patient's family history includes Colon cancer in her daughter; Esophageal cancer in her brother; Ovarian cancer in her daughter; Stroke in her mother.    ROS:  Please see the history of present illness.   Otherwise, review of systems are positive for none.   All other systems are reviewed and negative.    PHYSICAL EXAM: VS:  BP (!) 138/12 (BP Location: Left Arm, Patient Position: Sitting,  Cuff Size: Normal)   Pulse (!) 116   Ht 5\' 2"  (1.575 m)   Wt 138 lb 12 oz (62.9 kg)   SpO2 98%   BMI 25.38 kg/m  , BMI Body mass index is 25.38 kg/m. GEN: Well nourished, well developed, in no acute distress  HEENT: normal  Neck: No JVD, carotid bruits, or masses Cardiac: Irregularly irregular and tachycardic; no rubs, or gallops,no edema . 1/ 6 systolic ejection murmur at the aortic area.  Trace bilateral leg edema Respiratory: Clear to auscultation GI: soft, nontender, nondistended, + BS MS: no deformity or atrophy  Skin: warm and dry, no rash Neuro:  Strength and sensation are intact Psych: euthymic mood, full affect   EKG:  EKG is ordered today. The ekg ordered today demonstrates A. fib with RVR with nonspecific ST changes.  Recent Labs: 02/11/2018: B Natriuretic Peptide 713.5 09/14/2018: ALT 20; BUN 31; Creatinine 1.2; Hemoglobin 12.1; Platelets 222; Potassium 4.4; Sodium 139; TSH 0.90    Lipid Panel     Component Value Date/Time   CHOL 197 07/22/2018 0945   TRIG 175.0 (H) 07/22/2018 0945   HDL 51.60 07/22/2018 0945   CHOLHDL 4 07/22/2018 0945   VLDL 35.0 07/22/2018 0945   LDLCALC 111 (H) 07/22/2018 0945   LDLDIRECT 143.5 12/17/2012 0955      Wt Readings from Last 3 Encounters:  11/19/18 138 lb 12 oz (62.9 kg)  10/15/18 138 lb 9.6 oz (62.9 kg)  09/04/18 139 lb 4.8 oz (63.2 kg)       ASSESSMENT AND PLAN:  1.  Persistent atrial fibrillation: She was in sinus rhythm up until recently but she is in A. fib with RVR today.  She has noticed recent increased palpitations and exertional dyspnea.  She did have failed cardioversion in the past but she was significantly volume overloaded at that time.  I am hoping to get her back in sinus rhythm with an antiarrhythmic medication.  I added amiodarone 200 mg twice daily with plans for cardioversion in 10 days.  Continue metoprolol and Xarelto.  She has not missed any dose of Xarelto.   She is aware of the procedure and risks and benefits as she had it before.  2. Chronic diastolic heart failure: She appears to be euvolemic on small dose torsemide.  Her weight has been stable.  This is in spite of being in A. fib with RVR.  3. Essential hypertension: Blood pressure is controlled on current medications.  4.  Functional mitral regurgitation: Severe MR was only in the setting of severe volume overload.  MR was only mild on most recent transesophageal echo.   Disposition:   FU with me in 2 months.  Signed,  [name], MD  11/19/2018 2:01 PM    [facility]

## 2018-11-20 LAB — BASIC METABOLIC PANEL
BUN/Creatinine Ratio: 27 (ref 12–28)
BUN: 34 mg/dL — ABNORMAL HIGH (ref 8–27)
CO2: 19 mmol/L — ABNORMAL LOW (ref 20–29)
Calcium: 9 mg/dL (ref 8.7–10.3)
Chloride: 109 mmol/L — ABNORMAL HIGH (ref 96–106)
Creatinine, Ser: 1.27 mg/dL — ABNORMAL HIGH (ref 0.57–1.00)
GFR calc Af Amer: 44 mL/min/{1.73_m2} — ABNORMAL LOW (ref >59)
GFR calc non Af Amer: 38 mL/min/{1.73_m2} — ABNORMAL LOW (ref >59)
Glucose: 93 mg/dL (ref 65–99)
Potassium: 5.2 mmol/L (ref 3.5–5.2)
Sodium: 139 mmol/L (ref 134–144)

## 2018-11-20 LAB — CBC WITH DIFFERENTIAL/PLATELET
Basophils Absolute: 0 10*3/uL (ref 0.0–0.2)
Basos: 0 %
EOS (ABSOLUTE): 0.2 10*3/uL (ref 0.0–0.4)
Eos: 4 %
Hematocrit: 39.5 % (ref 34.0–46.6)
Hemoglobin: 12.8 g/dL (ref 11.1–15.9)
Immature Grans (Abs): 0 10*3/uL (ref 0.0–0.1)
Immature Granulocytes: 0 %
Lymphocytes Absolute: 1.3 10*3/uL (ref 0.7–3.1)
Lymphs: 24 %
MCH: 30.6 pg (ref 26.6–33.0)
MCHC: 32.4 g/dL (ref 31.5–35.7)
MCV: 95 fL (ref 79–97)
Monocytes Absolute: 0.5 10*3/uL (ref 0.1–0.9)
Monocytes: 10 %
Neutrophils Absolute: 3.4 10*3/uL (ref 1.4–7.0)
Neutrophils: 62 %
Platelets: 174 10*3/uL (ref 150–450)
RBC: 4.18 x10E6/uL (ref 3.77–5.28)
RDW: 14.3 % (ref 11.7–15.4)
WBC: 5.4 10*3/uL (ref 3.4–10.8)

## 2018-11-23 ENCOUNTER — Other Ambulatory Visit: Payer: Self-pay

## 2018-11-23 ENCOUNTER — Ambulatory Visit (HOSPITAL_COMMUNITY)
Admission: RE | Admit: 2018-11-23 | Discharge: 2018-11-23 | Disposition: A | Discharge status: Home / Self Care | Payer: Medicare Other | Source: Ambulatory Visit | Attending: Internal Medicine | Admitting: Internal Medicine

## 2018-11-23 VITALS — BP 104/76 | Wt 136.2 lb

## 2018-11-23 DIAGNOSIS — E7800 Pure hypercholesterolemia, unspecified: Secondary | ICD-10-CM | POA: Diagnosis not present

## 2018-11-23 DIAGNOSIS — N183 Chronic kidney disease, stage 3 unspecified: Secondary | ICD-10-CM

## 2018-11-23 DIAGNOSIS — Z79899 Other long term (current) drug therapy: Secondary | ICD-10-CM | POA: Insufficient documentation

## 2018-11-23 DIAGNOSIS — I130 Hypertensive heart and chronic kidney disease with heart failure and stage 1 through stage 4 chronic kidney disease, or unspecified chronic kidney disease: Secondary | ICD-10-CM | POA: Diagnosis not present

## 2018-11-23 DIAGNOSIS — E039 Hypothyroidism, unspecified: Secondary | ICD-10-CM | POA: Diagnosis not present

## 2018-11-23 DIAGNOSIS — H409 Unspecified glaucoma: Secondary | ICD-10-CM | POA: Diagnosis not present

## 2018-11-23 DIAGNOSIS — I2510 Atherosclerotic heart disease of native coronary artery without angina pectoris: Secondary | ICD-10-CM | POA: Diagnosis not present

## 2018-11-23 DIAGNOSIS — I4819 Other persistent atrial fibrillation: Secondary | ICD-10-CM | POA: Insufficient documentation

## 2018-11-23 DIAGNOSIS — K754 Autoimmune hepatitis: Secondary | ICD-10-CM | POA: Insufficient documentation

## 2018-11-23 DIAGNOSIS — I252 Old myocardial infarction: Secondary | ICD-10-CM | POA: Diagnosis not present

## 2018-11-23 DIAGNOSIS — N179 Acute kidney failure, unspecified: Secondary | ICD-10-CM | POA: Diagnosis not present

## 2018-11-23 DIAGNOSIS — I5032 Chronic diastolic (congestive) heart failure: Secondary | ICD-10-CM

## 2018-11-23 DIAGNOSIS — I340 Nonrheumatic mitral (valve) insufficiency: Secondary | ICD-10-CM | POA: Diagnosis not present

## 2018-11-23 DIAGNOSIS — Z7901 Long term (current) use of anticoagulants: Secondary | ICD-10-CM | POA: Insufficient documentation

## 2018-11-23 NOTE — Patient Instructions (Signed)
Please follow up with the [facility] in 2 months.  At the [facility], you and your health needs are our priority. As part of our continuing mission to provide you with exceptional heart care, we have created designated Provider Care Teams. These Care Teams include your primary Cardiologist (physician) and Advanced Practice Providers (APPs- Physician Assistants and Nurse Practitioners) who all work together to provide you with the care you need, when you need it.   You may see any of the following providers on your designated Care Team at your next follow up: . Dr Daniel Bensimhon . Dr Dalton McLean . Amy Clegg, NP   Please be sure to bring in all your medications bottles to every appointment.    

## 2018-11-23 NOTE — Progress Notes (Signed)
Advanced [facility] Failure Clinic Note   Date:  11/23/2018   ID:  Ruth Roth, DOB 1929/01/29, MRN [id number]  Location: Home  Provider location: [place] [facility] Type of Visit: Established patient  PCP:  Einar Pheasant, MD  Cardiologist:  [name], MD Primary HF: Ruth Roth  Chief Complaint: Heart Failure follow-up   History of Present Illness:  Ruth Roth is a 85 y.o. female with a history of 85 year old female with a history of coronary artery disease, chronic diastolic congestive heart failure, persistent atrial fibrillation, hypertension, peptic ulcer disease, fibrocystic breast disease, inflammatory arthritis, autoimmune hepatitis, and neuropathy.  Admitted to [facility] 08/21/17 s/p R/LHC which showed significantly elevated filling pressures, severe MR, and low cardiac output, and then transferred to [facility] 6/14 for consideration of MitraClip. AKI noted on admission felt to be due to cardiorenal syndrome. She required milrinone and lasix for diuresis. Diuresed 25 lbs and transitioned to 20 mg torsemide daily. Case and images reviewed at length with structural heart team. MR felt to be truly functional with improvement from hemodynamic optimization. Not felt to be MitraClip candidate at this time.Discharge weight: 121 lbs.   She presents for routine f/u today. Saw Dr. Fletcher Anon on 9/10 and was back in AF with RVR. Scheduled for DC-CV 9/21. Amio 200 bid started. Has cut abck torsemide from 20mg  daily to 20mg  qod. Says she feels fine. Doing all ADLs without any significant DOE. No palpitations, dizziness, edema, orthopnea or PND. No bleeding on Xarelto.     Cardiac Studies: Echo 10/09/17: EF 55-60%, no AI, mild to mod MR, LA moderately dilated, RA mildly dilated, RV normal, PA peak pressure 52 mmHg  [facility] 08/21/2017 - Mild non-obstructive CAD RHC Procedural Findings:(Obtained from Scan 08/21/17 at 1030) Hemodynamics (mmHg) RA mean17 RV68/11 (19) PA62/27  (44) PCWP41 AO147/67 (102) Cardiac Output(Fick) 2.45 Cardiac Index(Fick) 1.46 PVR 1.23 WU  Echo 08/01/17 LVEF 50-55%, Mild AS, Mod MR, Mod LAE, Mild RAE, Mod TR, PA peak pressure 65 mm Hg.MR ++ calcified, and RV mild/moderately reduced function.      Past Medical History:  Diagnosis Date  . (HFpEF) heart failure with preserved ejection fraction ([facility])    a. 07/2017 Echo: EF 50-55%, no rwma, mild AS/AI, mod MR, mod dil LA, mod TR, PASP 40mmHg.  . Autoimmune hepatitis ([place])    followed by Dr [name]  . Fibrocystic breast disease   . Glaucoma   . Hypercholesterolemia   . Hypertension   . Hypothyroidism   . Inflammatory arthritis   . MI (myocardial infarction) ([place])   . Neuropathy   . Non-obstructive Coronary artery disease    a. 05/2014 NSTEMI/Cath: LM nl, LAD mild dzs, LCX 60ost hazy (? culprit), RCA mild dzs. EF 60% by echo-->Med Rx; b. 08/2017 Cath: LM 30ost, LAD min irregs, LCX small, 40ost/p, RCA large, min irregs, RPDA/RPL1 nl, EF 50-55%. 4+MR.  . Osteoarthritis   . Permanent atrial fibrillation    a. CHA2DS2VASc = 6-->Xarelto.  . 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 Severe mitral regurgitation    a. 07/2017 Echo: Mod MR; b. 08/2017 Cath: 4+/Severe MR.   Past Surgical History:  Procedure Laterality Date  . ABDOMINAL HYSTERECTOMY  1963   partial, secondary to fibroids  . APPENDECTOMY    . Billroth    . CARDIAC CATHETERIZATION  05/12/14   [facility]  . CARDIOVERSION N/A 12/26/2016   Procedure: CARDIOVERSION;  Surgeon: [name]  A, MD;  Location: [facility] ORS;  Service: Cardiovascular;  Laterality: N/A;  . CARDIOVERSION N/A 05/16/2017   Procedure: CARDIOVERSION;  Surgeon: [name], MD;  Location: [facility] ORS;  Service: Cardiovascular;  Laterality: N/A;  . CARDIOVERSION N/A 06/09/2017   Procedure: CARDIOVERSION;  Surgeon: [name], MD;  Location: [facility] ORS;  Service: Cardiovascular;  Laterality: N/A;  .  CHOLECYSTECTOMY  1998  . COLONOSCOPY  2009  . RIGHT/LEFT HEART CATH AND CORONARY ANGIOGRAPHY Bilateral 08/21/2017   Procedure: RIGHT/LEFT HEART CATH AND CORONARY ANGIOGRAPHY;  Surgeon: [name], MD;  Location: [facility];  Service: Cardiovascular;  Laterality: Bilateral;  . TEE WITHOUT CARDIOVERSION N/A 08/25/2017   Procedure: TRANSESOPHAGEAL ECHOCARDIOGRAM (TEE);  Surgeon: Jolaine Artist, MD;  Location: [facility] ENDOSCOPY;  Service: Cardiovascular;  Laterality: N/A;  . UPPER GI ENDOSCOPY  2004     Current Outpatient Medications  Medication Sig Dispense Refill  . amiodarone (PACERONE) 200 MG tablet Take 1 tablet (200 mg total) by mouth 2 (two) times daily. 60 tablet 3  . brimonidine-timolol (COMBIGAN) 0.2-0.5 % ophthalmic solution Place 1 drop into the left eye 2 (two) times daily.     . calcitRIOL (ROCALTROL) 0.25 MCG capsule Take 0.25 mcg by mouth daily.     . dorzolamide (TRUSOPT) 2 % ophthalmic solution Place 1 drop into the left eye 2 (two) times daily.    . fluticasone (FLONASE) 50 MCG/ACT nasal spray Place 2 sprays into both nostrils daily as needed for allergies.     Marland Kitchen gabapentin (NEURONTIN) 300 MG capsule TAKE 2 CAPSULES BY MOUTH FOUR TIMES DAILY 720 capsule 0  . ipratropium (ATROVENT) 0.06 % nasal spray Place 2 sprays into both nostrils daily as needed for rhinitis.     Marland Kitchen latanoprost (XALATAN) 0.005 % ophthalmic solution APPLY 1 DROP(S) IN BOTH EYES ONCE A DAY    . levothyroxine (SYNTHROID) 112 MCG tablet Take 112 mcg by mouth daily before breakfast.    . losartan (COZAAR) 25 MG tablet Take 25 mg by mouth daily.     . metoprolol tartrate (LOPRESSOR) 50 MG tablet TAKE 1 TABLET(50 MG) BY MOUTH TWICE DAILY 180 tablet 3  . Multiple Vitamin (MULTI-VITAMIN) tablet Take by mouth.    . pantoprazole (PROTONIX) 20 MG tablet Take 1 tablet (20 mg total) by mouth daily. 90 tablet 1  . Rivaroxaban (XARELTO) 15 MG TABS tablet Take 1 tablet (15 mg total) by mouth daily with supper.  30 tablet 6  . torsemide (DEMADEX) 20 MG tablet Take 20 mg by mouth every other day.    . traMADol (ULTRAM) 50 MG tablet Take 1 tablet (50 mg total) by mouth daily as needed (for pain.). 30 tablet 0  . ursodiol (ACTIGALL) 300 MG capsule Take 300-600 mg by mouth See admin instructions. Take 2 capsules (600 mg) daily after lunch & 1 capsule (300 mg) at night.     No current facility-administered medications for this encounter.     Allergies:   Statins, Haldol [haloperidol lactate], Librax [chlordiazepoxide-clidinium], Plavix [clopidogrel bisulfate], and Ramipril   Social History:  The patient  reports that she has never smoked. She has never used smokeless tobacco. She reports that she does not drink alcohol or use drugs.   Family History:  The patient's family history includes Colon cancer in her daughter; Esophageal cancer in her brother; Ovarian cancer in her daughter; Stroke in her mother.   ROS:  Please see the history of present illness.   All other systems  are personally reviewed and negative.   Vitals:   11/23/18 1436  BP: 104/76  SpO2: 95%  Weight: 61.8 kg (136 lb 4 oz)    Exam:   General:  Elderly. No resp difficulty HEENT: normal Neck: supple. JVP 6-7. Carotids 2+ bilat; no bruits. No lymphadenopathy or thryomegaly appreciated. Cor: PMI nondisplaced. irregular rate & rhythm. No rubs, gallops or murmurs. Lungs: clear Abdomen: soft, nontender, nondistended. No hepatosplenomegaly. No bruits or masses. Good bowel sounds. Extremities: no cyanosis, clubbing, rash, edema Neuro: alert & orientedx3, cranial nerves grossly intact. moves all 4 extremities w/o difficulty. Affect pleasant  Recent Labs: 02/11/2018: B Natriuretic Peptide 713.5 09/14/2018: ALT 20; TSH 0.90 11/19/2018: BUN 34; Creatinine, Ser 1.27; Hemoglobin 12.8; Platelets 174; Potassium 5.2; Sodium 139  Personally reviewed   Wt Readings from Last 3 Encounters:  11/23/18 61.8 kg (136 lb 4 oz)  11/19/18 62.9 kg (138 lb  12 oz)  10/15/18 62.9 kg (138 lb 9.6 oz)    ECG: AF 102. Personally reviewed   ASSESSMENT AND PLAN:  1.Chronic diastolic congestive heart failure with low output: - Echo 08/01/17 LVEF 50-55%, Mild AS, Mod MR, Mod LAE, Mild RAE, Mod TR, RV moderately HK PA peak pressure 65 mm Hg. - Cath 08/21/17 with signficantly elevated filling pressures, severe MR, and low cardiac output. Had cardiorenal syndrome on admit with severe MR and RV failure and required milrinone - Echo 10/09/17: EF 55-60%, mild to mod MR - Doing well despite recurrent AF. NYHA II. Volume status stable. Continue Torsemide 20 qod. Ok to take extra as needed particularly while in AF.  - Recent labs reviewed. Renal function and K ok.  - Continue Lopressor 50 bid - Cardiac rehab on hold due to [place]. Would benefit from maintenance program if possible  2. Severe mitral regurgitation: - Echo 08/01/17 LVEF 50-55%, Mild AS, Mod MR, Mod LAE, Mild RAE, Mod TR, PA peak pressure 65 mm Hg.MR ++ calcified, and RV mild/moderately reduced function. - TEE 6/17 showed only mild MR with restricted posterior leaflet.  -Echo 10/09/17: EF 55-60%, mild to mod MR -Likely functional MR and MR improved with hemodynamic optimization.  Will follow closely if fails close medical therapy consider MitraClip - Repeat echo at next visit  3. Persistent atrial fibrillation: - Has previously failed amiodarone and DCCV x3.  - However was in NSR at last visit.  - Now back in AF with RVR (rate ~100). Has planned DC-CV with Dr. Fletcher Anon next week.  - Continue Lopressor 50 bid and amio 200 bid. Told her that I think there is 50-50 chance that she will hold NSR after DC-CV - Continue Xarelto. No bleeding. - Seen by Dr. Rayann Heman 12/01/17. He does not recommend either afib ablation or AV node ablation + PPM.    4. Essential hypertension: -Stable. Well controlled.   5. CKD 3 with h/o AKI - Creatinine 1.27 on 11/19/18    Signed, Glori Bickers, MD   11/23/2018 3:07 PM  Advanced [facility] [place] [address] [facility] [place] [address] [phone] (office) [phone] (fax)

## 2018-11-25 DIAGNOSIS — I5032 Chronic diastolic (congestive) heart failure: Secondary | ICD-10-CM

## 2018-11-25 NOTE — Progress Notes (Signed)
Pulmonary Individual Treatment Plan  Patient Details  Name: Ruth Roth MRN: [id number] Date of Birth: Oct 22, 1928 Referring Provider:     Pulmonary Rehab from 02/16/2018 in [facility] Cardiac and Pulmonary Rehab  Referring Provider  Glori Bickers MD      Initial Encounter Date:    Pulmonary Rehab from 02/16/2018 in [facility] Cardiac and Pulmonary Rehab  Date  02/16/18      Visit Diagnosis: Chronic diastolic heart failure ([place])  Patient's Home Medications on Admission:  Current Outpatient Medications:  .  amiodarone (PACERONE) 200 MG tablet, Take 1 tablet (200 mg total) by mouth 2 (two) times daily., Disp: 60 tablet, Rfl: 3 .  brimonidine-timolol (COMBIGAN) 0.2-0.5 % ophthalmic solution, Place 1 drop into the left eye 2 (two) times daily. , Disp: , Rfl:  .  calcitRIOL (ROCALTROL) 0.25 MCG capsule, Take 0.25 mcg by mouth daily. , Disp: , Rfl:  .  dorzolamide (TRUSOPT) 2 % ophthalmic solution, Place 1 drop into the left eye 2 (two) times daily., Disp: , Rfl:  .  fluticasone (FLONASE) 50 MCG/ACT nasal spray, Place 2 sprays into both nostrils daily as needed for allergies. , Disp: , Rfl:  .  gabapentin (NEURONTIN) 300 MG capsule, TAKE 2 CAPSULES BY MOUTH FOUR TIMES DAILY, Disp: 720 capsule, Rfl: 0 .  ipratropium (ATROVENT) 0.06 % nasal spray, Place 2 sprays into both nostrils daily as needed for rhinitis. , Disp: , Rfl:  .  latanoprost (XALATAN) 0.005 % ophthalmic solution, APPLY 1 DROP(S) IN BOTH EYES ONCE A DAY, Disp: , Rfl:  .  levothyroxine (SYNTHROID) 112 MCG tablet, Take 112 mcg by mouth daily before breakfast., Disp: , Rfl:  .  losartan (COZAAR) 25 MG tablet, Take 25 mg by mouth daily. , Disp: , Rfl:  .  metoprolol tartrate (LOPRESSOR) 50 MG tablet, TAKE 1 TABLET(50 MG) BY MOUTH TWICE DAILY, Disp: 180 tablet, Rfl: 3 .  Multiple Vitamin (MULTI-VITAMIN) tablet, Take by mouth., Disp: , Rfl:  .  pantoprazole (PROTONIX) 20 MG tablet, Take 1 tablet (20 mg total) by mouth daily., Disp: 90 tablet,  Rfl: 1 .  Rivaroxaban (XARELTO) 15 MG TABS tablet, Take 1 tablet (15 mg total) by mouth daily with supper., Disp: 30 tablet, Rfl: 6 .  torsemide (DEMADEX) 20 MG tablet, Take 20 mg by mouth every other day., Disp: , Rfl:  .  traMADol (ULTRAM) 50 MG tablet, Take 1 tablet (50 mg total) by mouth daily as needed (for pain.)., Disp: 30 tablet, Rfl: 0 .  ursodiol (ACTIGALL) 300 MG capsule, Take 300-600 mg by mouth See admin instructions. Take 2 capsules (600 mg) daily after lunch & 1 capsule (300 mg) at night., Disp: , Rfl:   Past Medical History: Past Medical History:  Diagnosis Date  . (HFpEF) heart failure with preserved ejection fraction ([place])    a. 07/2017 Echo: EF 50-55%, no rwma, mild AS/AI, mod MR, mod dil LA, mod TR, PASP 22mHg.  . Autoimmune hepatitis ([place]    followed by Dr SGustavo Lah . Fibrocystic breast disease   . Glaucoma   . Hypercholesterolemia   . Hypertension   . Hypothyroidism   . Inflammatory arthritis   . MI (myocardial infarction) ([place]   . Neuropathy   . Non-obstructive Coronary artery disease    a. 05/2014 NSTEMI/Cath: LM nl, LAD mild dzs, LCX 60ost hazy (? culprit), RCA mild dzs. EF 60% by echo-->Med Rx; b. 08/2017 Cath: LM 30ost, LAD min irregs, LCX small, 40ost/p, RCA large, min irregs, RPDA/RPL1 nl,  EF 50-55%. 4+MR.  . Osteoarthritis   . Permanent atrial fibrillation    a. CHA2DS2VASc = 6-->Xarelto.  . 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 Severe mitral regurgitation    a. 07/2017 Echo: Mod MR; b. 08/2017 Cath: 4+/Severe MR.    Tobacco Use: Social History   Tobacco Use  Smoking Status Never Smoker  Smokeless Tobacco Never Used    Labs: Recent Review Flowsheet Data    Labs for ITP Cardiac and Pulmonary Rehab Latest Ref Rng & Units 07/29/2016 01/17/2017 05/27/2017 01/27/2018 07/22/2018   Cholestrol 0 - 200 mg/dL 215(H) 199 159 144 197   LDLCALC 0 - 99 mg/dL 125(H) 110(H) 74 78 111(H)   LDLDIRECT mg/dL - -  - - -   HDL >39.00 mg/dL 54.20 67.30 64.30 44.10 51.60   Trlycerides 0.0 - 149.0 mg/dL 178.0(H) 107.0 101.0 107.0 175.0(H)   Hemoglobin A1c 4.6 - 6.5 % 6.0 5.8 5.7 5.6 -   TCO2 0 - 100 mmol/L - - - - -       Pulmonary Assessment Scores:   UCSD: Self-administered rating of dyspnea associated with activities of daily living (ADLs) 6-point scale (0 = "not at all" to 5 = "maximal or unable to do because of breathlessness")  Scoring Scores range from 0 to 120.  Minimally important difference is 5 units  CAT: CAT can identify the health impairment of COPD patients and is better correlated with disease progression.  CAT has a scoring range of zero to 40. The CAT score is classified into four groups of low (less than 10), medium (10 - 20), high (21-30) and very high (31-40) based on the impact level of disease on health status. A CAT score over 10 suggests significant symptoms.  A worsening CAT score could be explained by an exacerbation, poor medication adherence, poor inhaler technique, or progression of COPD or comorbid conditions.  CAT MCID is 2 points  mMRC: mMRC (Modified Medical Research Council) Dyspnea Scale is used to assess the degree of baseline functional disability in patients of respiratory disease due to dyspnea. No minimal important difference is established. A decrease in score of 1 point or greater is considered a positive change.   Pulmonary Function Assessment:   Exercise Target Goals: Exercise Program Goal: Individual exercise prescription set using results from initial 6 min walk test and THRR while considering  patient's activity barriers and safety.   Exercise Prescription Goal: Initial exercise prescription builds to 30-45 minutes a day of aerobic activity, 2-3 days per week.  Home exercise guidelines will be given to patient during program as part of exercise prescription that the participant will acknowledge.  Activity Barriers & Risk Stratification:   6  Minute Walk:  Oxygen Initial Assessment:   Oxygen Re-Evaluation: Oxygen Re-Evaluation    Row Name 06/04/18 1044 06/23/18 1013 08/05/18 1357 09/22/18 1449       Program Oxygen Prescription   Program Oxygen Prescription  None  None  None  None      Home Oxygen   Home Oxygen Device  None  None  None  None    Sleep Oxygen Prescription  None  None  None  None    Home Exercise Oxygen Prescription  None  None  None  None    Home at Rest Exercise Oxygen Prescription  None  None  None  -      Goals/Expected Outcomes   Short Term Goals  To  learn and understand importance of maintaining oxygen saturations>88%;To learn and demonstrate proper pursed lip breathing techniques or other breathing techniques.;To learn and understand importance of monitoring SPO2 with pulse oximeter and demonstrate accurate use of the pulse oximeter.  To learn and understand importance of maintaining oxygen saturations>88%;To learn and demonstrate proper pursed lip breathing techniques or other breathing techniques.;To learn and understand importance of monitoring SPO2 with pulse oximeter and demonstrate accurate use of the pulse oximeter.  To learn and understand importance of maintaining oxygen saturations>88%;To learn and demonstrate proper pursed lip breathing techniques or other breathing techniques.;To learn and understand importance of monitoring SPO2 with pulse oximeter and demonstrate accurate use of the pulse oximeter.  To learn and understand importance of maintaining oxygen saturations>88%;To learn and demonstrate proper pursed lip breathing techniques or other breathing techniques.;To learn and understand importance of monitoring SPO2 with pulse oximeter and demonstrate accurate use of the pulse oximeter.    Long  Term Goals  Verbalizes importance of monitoring SPO2 with pulse oximeter and return demonstration;Maintenance of O2 saturations>88%;Exhibits proper breathing techniques, such as pursed lip breathing or  other method taught during program session  Verbalizes importance of monitoring SPO2 with pulse oximeter and return demonstration;Maintenance of O2 saturations>88%;Exhibits proper breathing techniques, such as pursed lip breathing or other method taught during program session  Verbalizes importance of monitoring SPO2 with pulse oximeter and return demonstration;Maintenance of O2 saturations>88%;Exhibits proper breathing techniques, such as pursed lip breathing or other method taught during program session  Verbalizes importance of monitoring SPO2 with pulse oximeter and return demonstration;Maintenance of O2 saturations>88%;Exhibits proper breathing techniques, such as pursed lip breathing or other method taught during program session    Comments  Ruth Roth has been doing well.  She continues to ues her PLB and finds it helpful. She still hasn't changed the battery in her pulse oximeter, but it works part time.  She will continue to check it.    Ruth Roth has been doing well.  Her breathing is doing pretty well and she continues to use her PLB.  She has not been out of the house  due to the wind.   Ruth Roth has been getting out to walk more.  Her breathing continues to do well and she is using her PLB.   Ruth Roth continues to use her PLB and finds it helpful for doing things around the house.  She is avoiding the heat as much as possible as it really aggravates her breathing.    Goals/Expected Outcomes  Short: Get battery changed in pulse oximeter  Long: Continue to use PLB to help with breathing.   Short: Continue to use PLB.  Long: Continue to check saturations as able.   Short: Continue to breathe well and use PLB.  Long: Continue to to monitor saturations.   Short: Contiue to use PLB.  Long: Continue to manage pulmonary conditions.       Oxygen Discharge (Final Oxygen Re-Evaluation): Oxygen Re-Evaluation - 09/22/18 1449      Program Oxygen Prescription   Program Oxygen Prescription  None      Home Oxygen   Home  Oxygen Device  None    Sleep Oxygen Prescription  None    Home Exercise Oxygen Prescription  None      Goals/Expected Outcomes   Short Term Goals  To learn and understand importance of maintaining oxygen saturations>88%;To learn and demonstrate proper pursed lip breathing techniques or other breathing techniques.;To learn and understand importance of monitoring SPO2 with pulse oximeter and demonstrate  accurate use of the pulse oximeter.    Long  Term Goals  Verbalizes importance of monitoring SPO2 with pulse oximeter and return demonstration;Maintenance of O2 saturations>88%;Exhibits proper breathing techniques, such as pursed lip breathing or other method taught during program session    Comments  Emri continues to use her PLB and finds it helpful for doing things around the house.  She is avoiding the heat as much as possible as it really aggravates her breathing.    Goals/Expected Outcomes  Short: Contiue to use PLB.  Long: Continue to manage pulmonary conditions.       Initial Exercise Prescription:   Perform Capillary Blood Glucose checks as needed.  Exercise Prescription Changes: Exercise Prescription Changes    Row Name 06/03/18 1500             Response to Exercise   Blood Pressure (Admit)  120/62       Blood Pressure (Exercise)  146/74       Blood Pressure (Exit)  126/62       Heart Rate (Admit)  59 bpm       Heart Rate (Exercise)  99 bpm       Heart Rate (Exit)  78 bpm       Oxygen Saturation (Admit)  93 %       Oxygen Saturation (Exercise)  88 %       Oxygen Saturation (Exit)  93 %       Rating of Perceived Exertion (Exercise)  13       Perceived Dyspnea (Exercise)  2       Symptoms  none       Duration  Continue with 45 min of aerobic exercise without signs/symptoms of physical distress.       Intensity  THRR unchanged         Progression   Progression  Continue to progress workloads to maintain intensity without signs/symptoms of physical distress.        Average METs  2.56         Resistance Training   Training Prescription  Yes       Weight  3 lbs       Reps  10-15         Interval Training   Interval Training  No         Treadmill   MPH  1.8       Grade  0.5       Minutes  15       METs  2.5         Recumbant Bike   Level  4       Watts  22       Minutes  15       METs  2.76         NuStep   Level  5       Minutes  15       METs  2.2         Home Exercise Plan   Plans to continue exercise at  Home (comment) walking and treadmill       Frequency  Add 2 additional days to program exercise sessions.       Initial Home Exercises Provided  03/18/18          Exercise Comments:   Exercise Goals and Review:   Exercise Goals Re-Evaluation : Exercise Goals Re-Evaluation    Row Name 06/03/18 1523 06/23/18 1015 07/14/18 1100  08/05/18 1347 09/22/18 1445     Exercise Goal Re-Evaluation   Exercise Goals Review  Increase Physical Activity;Increase Strength and Stamina;Understanding of Exercise Prescription  Increase Physical Activity;Increase Strength and Stamina;Understanding of Exercise Prescription  Increase Physical Activity;Increase Strength and Stamina;Understanding of Exercise Prescription  Increase Physical Activity;Increase Strength and Stamina;Understanding of Exercise Prescription  Increase Physical Activity;Increase Strength and Stamina;Understanding of Exercise Prescription   Comments  Ruth Roth had been doing well in rehab and getting close to graduation.  She improved her post 6MWT by 32f!!  She will need to continue to walk at home.  She has not been getting out as much with the weather, but hopes to this weekend as it gets a little warmer out.  We will montior her progression.  Ruth Roth not really been getting in her exercise. She has not been able to walk outside with the wind as it bothers her sinuses. She is going to try to get out today as the wind is a little less.   We will continue to encourage her to get out to  walk.   Ruth Roth still not doing her exercise.  She continues to avoid going outside but does do a little inside with her ADLs.  Today is not very windy so I encouraged her to get her coat on and go for a walk.   Ruth Roth finally been able to get back to her walking again!  She has enjoyed getting out of the house more.   Ruth Roth been walking in the house at home. She does not want to get out in the heat as it aggravates her breathing.  She is looking forward to getting back to rehab to finish up.   Expected Outcomes  Short: Continue to walk at home.  Long: Continue to maintain activity levels.   Short: Get outside to walk.  Long: Continue to maintain activity.   Short: Walk more often.   Short: Continue to walk daily  Long: Continue to rebuild activity levels.   Short: Continue to walk/exercise inside daily.  Long: Continue to maintain strength.      Discharge Exercise Prescription (Final Exercise Prescription Changes): Exercise Prescription Changes - 06/03/18 1500      Response to Exercise   Blood Pressure (Admit)  120/62    Blood Pressure (Exercise)  146/74    Blood Pressure (Exit)  126/62    Heart Rate (Admit)  59 bpm    Heart Rate (Exercise)  99 bpm    Heart Rate (Exit)  78 bpm    Oxygen Saturation (Admit)  93 %    Oxygen Saturation (Exercise)  88 %    Oxygen Saturation (Exit)  93 %    Rating of Perceived Exertion (Exercise)  13    Perceived Dyspnea (Exercise)  2    Symptoms  none    Duration  Continue with 45 min of aerobic exercise without signs/symptoms of physical distress.    Intensity  THRR unchanged      Progression   Progression  Continue to progress workloads to maintain intensity without signs/symptoms of physical distress.    Average METs  2.56      Resistance Training   Training Prescription  Yes    Weight  3 lbs    Reps  10-15      Interval Training   Interval Training  No      Treadmill   MPH  1.8    Grade  0.5    Minutes  15    METs  2.5      Recumbant  Bike   Level  4    Watts  22    Minutes  15    METs  2.76      NuStep   Level  5    Minutes  15    METs  2.2      Home Exercise Plan   Plans to continue exercise at  Home (comment)   walking and treadmill   Frequency  Add 2 additional days to program exercise sessions.    Initial Home Exercises Provided  03/18/18       Nutrition:  Target Goals: Understanding of nutrition guidelines, daily intake of sodium <1542m, cholesterol <2058m calories 30% from fat and 7% or less from saturated fats, daily to have 5 or more servings of fruits and vegetables.  Biometrics:    Nutrition Therapy Plan and Nutrition Goals:   Nutrition Assessments:   Nutrition Goals Re-Evaluation:   Nutrition Goals Discharge (Final Nutrition Goals Re-Evaluation):   Psychosocial: Target Goals: Acknowledge presence or absence of significant depression and/or stress, maximize coping skills, provide positive support system. Participant is able to verbalize types and ability to use techniques and skills needed for reducing stress and depression.   Initial Review & Psychosocial Screening:   Quality of Life Scores:  Scores of 19 and below usually indicate a poorer quality of life in these areas.  A difference of  2-3 points is a clinically meaningful difference.  A difference of 2-3 points in the total score of the Quality of Life Index has been associated with significant improvement in overall quality of life, self-image, physical symptoms, and general health in studies assessing change in quality of life.  PHQ-9: Recent Review Flowsheet Data    [facility] 05/22/2018 04/23/2018 03/27/2018 02/16/2018 11/25/2017   Decreased Interest 0 0 0 3 1   Down, Depressed, Hopeless 0 0 0 0 0   PHQ - 2 Score 0 0 0 3 1   Altered sleeping 0 - 0 0 2   Tired, decreased energy 0 - 1 3 3    Change in appetite 0 - 1 2 0   Feeling bad or failure about yourself  0 - 0 0 0   Trouble concentrating 0 - 0 0 0    Moving slowly or fidgety/restless 0 - 0 0 0   Suicidal thoughts 0 - 0 0 0   PHQ-9 Score 0 - 2 8 6    Difficult doing work/chores Not difficult at all - Not difficult at all Somewhat difficult Somewhat difficult     Interpretation of Total Score  Total Score Depression Severity:  1-4 = Minimal depression, 5-9 = Mild depression, 10-14 = Moderate depression, 15-19 = Moderately severe depression, 20-27 = Severe depression   Psychosocial Evaluation and Intervention:   Psychosocial Re-Evaluation: Psychosocial Re-Evaluation    Row Name 06/04/18 1055 06/23/18 1004 07/14/18 1103 08/05/18 1353 09/22/18 1446     Psychosocial Re-Evaluation   Current issues with  Current Stress Concerns  Current Stress Concerns  Current Stress Concerns  Current Stress Concerns  Current Stress Concerns   Comments  Ruth Roth doing well at home. She is staying postive and knows that staying home is the right thing.  She misses coming to class!   She continues to sleep well.  She has not been getting out to exercise but plans to as the weather gets better.   Ruth Roth doing well at  home still.  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  She is finding having someone to talk to helps maintain her sanity and thier friendship.  She is ready to get back to her routine and misses getting out to exercise.  She has not really gotten out much at all, she has not been outside due to the wind and her sinuses.  Overall, she is coping well.   Ruth Roth is doing okay at home. She is still staying inside and not walking.  She contnues to chat with her classmates regularly and ask how everyone is doing.  She misses coming to class and looks forward to getting back to it.   Chelsei has been doing okay at home.  She is getting out to walk more and still checking in with friends and class mates.  She actually had company this morning when I called and had asked for a call back this  afternoon.   Ruth Roth is doing well mentally.  She continues to check in with friends and class mates to stay connected. She is eager to get back to class to finish up.   Expected Outcomes  Short: Get out of house to walk.  Long: Continue to stay postive.   Short: Get out of house to walk.  Long: Continue to connect with friends for support.   Short: Go for walks to get out of house.  Long: Continue to connect with friends.   Short: Continue to get out to walk.  Long: Continue to stay connected.   Short: Continue exercise for mood boost.  Long: Continue to connect with friends.   Interventions  Encouraged to attend Pulmonary Rehabilitation for the exercise  Encouraged to attend Pulmonary Rehabilitation for the exercise  Encouraged to attend Pulmonary Rehabilitation for the exercise  Encouraged to attend Pulmonary Rehabilitation for the exercise  Encouraged to attend Pulmonary Rehabilitation for the exercise   Continue Psychosocial Services   Follow up required by staff  Follow up required by staff  Follow up required by staff  Follow up required by staff  Follow up required by staff      Psychosocial Discharge (Final Psychosocial Re-Evaluation): Psychosocial Re-Evaluation - 09/22/18 1446      Psychosocial Re-Evaluation   Current issues with  Current Stress Concerns    Comments  Ruth Roth is doing well mentally.  She continues to check in with friends and class mates to stay connected. She is eager to get back to class to finish up.    Expected Outcomes  Short: Continue exercise for mood boost.  Long: Continue to connect with friends.    Interventions  Encouraged to attend Pulmonary Rehabilitation for the exercise    Continue Psychosocial Services   Follow up required by staff       Education: Education Goals: Education classes will be provided on a weekly basis, covering required topics. Participant will state understanding/return demonstration of topics presented.  Learning  Barriers/Preferences:   Education Topics:  Initial Evaluation Education: - Verbal, written and demonstration of respiratory meds, oximetry and breathing techniques. Instruction on use of nebulizers and MDIs and importance of monitoring MDI activations.   Pulmonary Rehab from 05/20/2018 in [facility] Cardiac and Pulmonary Rehab  Date  02/16/18  Educator  [facility]  Instruction Review Code  1- Verbalizes Understanding      General Nutrition Guidelines/Fats and Fiber: -Group instruction provided by verbal, written material,  models and posters to present the general guidelines for heart healthy nutrition. Gives an explanation and review of dietary fats and fiber.   Pulmonary Rehab from 05/20/2018 in [facility] Cardiac and Pulmonary Rehab  Date  04/22/18  Educator  [facility]  Instruction Review Code  1- Verbalizes Understanding      Controlling Sodium/Reading Food Labels: -Group verbal and written material supporting the discussion of sodium use in heart healthy nutrition. Review and explanation with models, verbal and written materials for utilization of the food label.   Pulmonary Rehab from 05/20/2018 in [facility] Cardiac and Pulmonary Rehab  Date  04/29/18  Educator  [facility]  Instruction Review Code  1- Verbalizes Understanding      Exercise Physiology & General Exercise Guidelines: - Group verbal and written instruction with models to review the exercise physiology of the cardiovascular system and associated critical values. Provides general exercise guidelines with specific guidelines to those with heart or lung disease.    Pulmonary Rehab from 05/20/2018 in [facility] Cardiac and Pulmonary Rehab  Date  04/01/18  Educator  [facility]  Instruction Review Code  1- Verbalizes Understanding      Aerobic Exercise & Resistance Training: - Gives group verbal and written instruction on the various components of exercise. Focuses on aerobic and resistive training programs and the benefits of this training and how to safely progress  through these programs.   Pulmonary Rehab from 05/20/2018 in [facility] Cardiac and Pulmonary Rehab  Date  04/03/18  Educator  [facility]  Instruction Review Code  1- Verbalizes Understanding      Flexibility, Balance, Mind/Body Relaxation: Provides group verbal/written instruction on the benefits of flexibility and balance training, including mind/body exercise modes such as yoga, pilates and tai chi.  Demonstration and skill practice provided.   Pulmonary Rehab from 05/20/2018 in [facility] Cardiac and Pulmonary Rehab  Date  04/08/18  Educator  [facility]  Instruction Review Code  1- Verbalizes Understanding      Stress and Anxiety: - Provides group verbal and written instruction about the health risks of elevated stress and causes of high stress.  Discuss the correlation between heart/lung disease and anxiety and treatment options. Review healthy ways to manage with stress and anxiety.   Pulmonary Rehab from 05/20/2018 in [facility] Cardiac and Pulmonary Rehab  Date  04/15/18  Educator  [facility]  Instruction Review Code  1- Verbalizes Understanding      Depression: - Provides group verbal and written instruction on the correlation between heart/lung disease and depressed mood, treatment options, and the stigmas associated with seeking treatment.   Pulmonary Rehab from 05/20/2018 in [facility] Cardiac and Pulmonary Rehab  Date  05/13/18  Educator  [place]  Instruction Review Code  1- Verbalizes Understanding      Exercise & Equipment Safety: - Individual verbal instruction and demonstration of equipment use and safety with use of the equipment.   Pulmonary Rehab from 05/20/2018 in [facility] Cardiac and Pulmonary Rehab  Date  02/16/18  Educator  [facility]  Instruction Review Code  1- Verbalizes Understanding      Infection Prevention: - Provides verbal and written material to individual with discussion of infection control including proper hand washing and proper equipment cleaning during exercise session.   Pulmonary Rehab from  05/20/2018 in [facility] Cardiac and Pulmonary Rehab  Date  02/16/18  Educator  [facility]  Instruction Review Code  1- Verbalizes Understanding      Falls Prevention: - Provides verbal and written material to individual with discussion of falls prevention and  safety.   Pulmonary Rehab from 05/20/2018 in [facility] Cardiac and Pulmonary Rehab  Date  02/16/18  Educator  [facility]  Instruction Review Code  1- Verbalizes Understanding      Diabetes: - Individual verbal and written instruction to review signs/symptoms of diabetes, desired ranges of glucose level fasting, after meals and with exercise. Advice that pre and post exercise glucose checks will be done for 3 sessions at entry of program.   Chronic Lung Diseases: - Group verbal and written instruction to review updates, respiratory medications, advancements in procedures and treatments. Discuss use of supplemental oxygen including available portable oxygen systems, continuous and intermittent flow rates, concentrators, personal use and safety guidelines. Review proper use of inhaler and spacers. Provide informative websites for self-education.    Pulmonary Rehab from 05/20/2018 in [facility] Cardiac and Pulmonary Rehab  Date  04/17/18  Educator  [facility]  Instruction Review Code  1- Verbalizes Understanding      Energy Conservation: - Provide group verbal and written instruction for methods to conserve energy, plan and organize activities. Instruct on pacing techniques, use of adaptive equipment and posture/positioning to relieve shortness of breath.   Pulmonary Rehab from 05/20/2018 in [facility] Cardiac and Pulmonary Rehab  Date  05/15/18  Educator  [facility]  Instruction Review Code  1- Verbalizes Understanding      Triggers and Exacerbations: - Group verbal and written instruction to review types of environmental triggers and ways to prevent exacerbations. Discuss weather changes, air quality and the benefits of nasal washing. Review warning signs and symptoms to help  prevent infections. Discuss techniques for effective airway clearance, coughing, and vibrations.   AED/CPR: - Group verbal and written instruction with the use of models to demonstrate the basic use of the AED with the basic ABC's of resuscitation.   Pulmonary Rehab from 05/20/2018 in [facility] Cardiac and Pulmonary Rehab  Date  03/25/18  Educator  [facility]  Instruction Review Code  1- Actuary and Physiology of the Lungs: - Group verbal and written instruction with the use of models to provide basic lung anatomy and physiology related to function, structure and complications of lung disease.   Pulmonary Rehab from 05/20/2018 in [facility] Cardiac and Pulmonary Rehab  Date  05/20/18  Educator  [facility]  Instruction Review Code  1- Verbalizes Understanding      Anatomy & Physiology of the Heart: - Group verbal and written instruction and models provide basic cardiac anatomy and physiology, with the coronary electrical and arterial systems. Review of Valvular disease and Heart Failure   Pulmonary Rehab from 05/20/2018 in [facility] Cardiac and Pulmonary Rehab  Date  03/13/18  Educator  [facility]  Instruction Review Code  1- Verbalizes Understanding      Cardiac Medications: - Group verbal and written instruction to review commonly prescribed medications for heart disease. Reviews the medication, class of the drug, and side effects.   Pulmonary Rehab from 05/20/2018 in [facility] Cardiac and Pulmonary Rehab  Date  03/20/18  Educator  [facility]  Instruction Review Code  1- Verbalizes Understanding      Know Your Numbers and Risk Factors: -Group verbal and written instruction about important numbers in your health.  Discussion of what are risk factors and how they play a role in the disease process.  Review of Cholesterol, Blood Pressure, Diabetes, and BMI and the role they play in your overall health.   Pulmonary Rehab from 05/20/2018 in [facility] Cardiac and Pulmonary Rehab  Date  05/06/18  Educator  [facility]   Instruction Review Code  1- Verbalizes Understanding      Sleep Hygiene: -Provides group verbal and written instruction about how sleep can affect your health.  Define sleep hygiene, discuss sleep cycles and impact of sleep habits. Review good sleep hygiene tips.    Pulmonary Rehab from 05/20/2018 in [facility] Cardiac and Pulmonary Rehab  Date  03/18/18  Educator  [facility]  Instruction Review Code  1- Verbalizes Understanding      Other: -Provides group and verbal instruction on various topics (see comments)    Knowledge Questionnaire Score:    Core Components/Risk Factors/Patient Goals at Admission:   Core Components/Risk Factors/Patient Goals Review:  Goals and Risk Factor Review    Row Name 06/04/18 1053 06/23/18 0957 07/14/18 1106 08/05/18 1355 09/22/18 1447     Core Components/Risk Factors/Patient Goals Review   Personal Goals Review  Weight Management/Obesity;Improve shortness of breath with ADL's;Hypertension;Heart Failure  Weight Management/Obesity;Improve shortness of breath with ADL's;Hypertension;Heart Failure  Weight Management/Obesity;Improve shortness of breath with ADL's;Hypertension;Heart Failure  Weight Management/Obesity;Improve shortness of breath with ADL's;Hypertension;Heart Failure  Weight Management/Obesity;Improve shortness of breath with ADL's;Hypertension;Heart Failure   Review  Ruth Roth has been doing well. Her weight fluctuates about 1-2 lbs, but no major swings. She denies any heart failure symptoms.  She has not  had any shortness of breath at home.   Ruth Roth has not been very active recently.  Her weight continues to go up and down about 1-2lbs regularly.  She thinks she may be a little heavier than when we closed.  She has not noticed any heart failure symptoms and still not certain that she has heart failure.  Her breathing has not been bothering her.  Her biggest issue continues to be her legs when she is walking, but has been tested for PAD and does not have it.   Her  blood pressures have been good and she checks thme 2-3 times a week.    Ruth Roth is still yo-yoing on her weight, but not gaining too much.  She denies heart failure symptoms.  She had a televisit with her cardiologist last week that went very well.  He was pleased with her progress.  She is still able to do her ADls around the house without a problem.   Ruth Roth is doing well.  She thinks her weight is up about 5 lbs total. She has not had any heart failure symptoms recentely.  She continues to use televisits and has had good reports thus far.  Today her blood pressure was 114/68.  She is doing well with her breathing.   Ruth Roth is doing well. She thinks her weight is staying steady now.  She denies heart failure symptoms.  Her pressures have been good and her breathing is good as long as she stays inside.  She is eager to get back to class.   Expected Outcomes  Short: Continue to monitor weight daily.  Long: Continue to manage her heart failure.   Short: Continue to stay on top of weight.  Long: Continue to monitor risk factors.   Short: Contnue to do ADLs and watch weight.  Long: Continue to monitor risk factors.   Short: Continue to work on weight loss.  Long: Continue to manage heart failure  Short: Continue to work on weight loss.  Long: Contineu to monitor risk factors.      Core Components/Risk Factors/Patient Goals at Discharge (Final Review):  Goals and Risk Factor Review - 09/22/18 1447  Core Components/Risk Factors/Patient Goals Review   Personal Goals Review  Weight Management/Obesity;Improve shortness of breath with ADL's;Hypertension;Heart Failure    Review  Ruth Roth is doing well. She thinks her weight is staying steady now.  She denies heart failure symptoms.  Her pressures have been good and her breathing is good as long as she stays inside.  She is eager to get back to class.    Expected Outcomes  Short: Continue to work on weight loss.  Long: Contineu to monitor risk factors.       ITP  Comments: ITP Comments    Row Name 06/01/18 [phone] 06/15/18 0946 11/25/18 1320       ITP Comments  Our program is currently closed due to COVID-19.  We are communicating with patient via phone calls and emails.  30 day review completed. ITP sent to Dr. Emily Filbert for review,changes as needed and signature. Continue with ITP unless changes directed by Dr. Sabra Heck.   Called Patient 5-6 times to resume [name]. Left message for patient. No response received. Patient Discharged. Discharge ITP sent to Dr. Emily Filbert director of [place].        Comments: Discharge ITP

## 2018-11-25 NOTE — Progress Notes (Signed)
Discharge Progress Report  Patient Details  Name: Ruth Roth MRN: [id number] Date of Birth: 06-30-1928 Referring Provider:     Pulmonary Rehab from 02/16/2018 in [facility] Cardiac and Pulmonary Rehab  Referring Provider  Glori Bickers MD       Number of Visits: 32/36  Reason for Discharge:  Early Exit:  Lack of attendance  Smoking History:  Social History   Tobacco Use  Smoking Status Never Smoker  Smokeless Tobacco Never Used    Diagnosis:  Chronic diastolic heart failure ([place])  ADL UCSD:   Initial Exercise Prescription:   Discharge Exercise Prescription (Final Exercise Prescription Changes): Exercise Prescription Changes - 06/03/18 1500      Response to Exercise   Blood Pressure (Admit)  120/62    Blood Pressure (Exercise)  146/74    Blood Pressure (Exit)  126/62    Heart Rate (Admit)  59 bpm    Heart Rate (Exercise)  99 bpm    Heart Rate (Exit)  78 bpm    Oxygen Saturation (Admit)  93 %    Oxygen Saturation (Exercise)  88 %    Oxygen Saturation (Exit)  93 %    Rating of Perceived Exertion (Exercise)  13    Perceived Dyspnea (Exercise)  2    Symptoms  none    Duration  Continue with 45 min of aerobic exercise without signs/symptoms of physical distress.    Intensity  THRR unchanged      Progression   Progression  Continue to progress workloads to maintain intensity without signs/symptoms of physical distress.    Average METs  2.56      Resistance Training   Training Prescription  Yes    Weight  3 lbs    Reps  10-15      Interval Training   Interval Training  No      Treadmill   MPH  1.8    Grade  0.5    Minutes  15    METs  2.5      Recumbant Bike   Level  4    Watts  22    Minutes  15    METs  2.76      NuStep   Level  5    Minutes  15    METs  2.2      Home Exercise Plan   Plans to continue exercise at  Home (comment)   walking and treadmill   Frequency  Add 2 additional days to program exercise sessions.    Initial Home  Exercises Provided  03/18/18       Functional Capacity:   Psychological, QOL, Others - Outcomes: PHQ 2/9: Depression screen [facility] 2/9 05/22/2018 04/23/2018 03/27/2018 02/16/2018 11/25/2017  Decreased Interest 0 0 0 3 1  Down, Depressed, Hopeless 0 0 0 0 0  PHQ - 2 Score 0 0 0 3 1  Altered sleeping 0 - 0 0 2  Tired, decreased energy 0 - 1 3 3   Change in appetite 0 - 1 2 0  Feeling bad or failure about yourself  0 - 0 0 0  Trouble concentrating 0 - 0 0 0  Moving slowly or fidgety/restless 0 - 0 0 0  Suicidal thoughts 0 - 0 0 0  PHQ-9 Score 0 - 2 8 6   Difficult doing work/chores Not difficult at all - Not difficult at all Somewhat difficult Somewhat difficult  Some recent data might be hidden    Quality of Life:  Personal Goals: Goals established at orientation with interventions provided to work toward goal.    Personal Goals Discharge: Goals and Risk Factor Review    Row Name 06/04/18 1053 06/23/18 0957 07/14/18 1106 08/05/18 1355 09/22/18 1447     Core Components/Risk Factors/Patient Goals Review   Personal Goals Review  Weight Management/Obesity;Improve shortness of breath with ADL's;Hypertension;Heart Failure  Weight Management/Obesity;Improve shortness of breath with ADL's;Hypertension;Heart Failure  Weight Management/Obesity;Improve shortness of breath with ADL's;Hypertension;Heart Failure  Weight Management/Obesity;Improve shortness of breath with ADL's;Hypertension;Heart Failure  Weight Management/Obesity;Improve shortness of breath with ADL's;Hypertension;Heart Failure   Review  Ruth Roth has been doing well. Her weight fluctuates about 1-2 lbs, but no major swings. She denies any heart failure symptoms.  She has not  had any shortness of breath at home.   Ruth Roth has not been very active recently.  Her weight continues to go up and down about 1-2lbs regularly.  She thinks she may be a little heavier than when we closed.  She has not noticed any heart failure symptoms and still not  certain that she has heart failure.  Her breathing has not been bothering her.  Her biggest issue continues to be her legs when she is walking, but has been tested for PAD and does not have it.   Her blood pressures have been good and she checks thme 2-3 times a week.    Ruth Roth is still yo-yoing on her weight, but not gaining too much.  She denies heart failure symptoms.  She had a televisit with her cardiologist last week that went very well.  He was pleased with her progress.  She is still able to do her ADls around the house without a problem.   Ruth Roth is doing well.  She thinks her weight is up about 5 lbs total. She has not had any heart failure symptoms recentely.  She continues to use televisits and has had good reports thus far.  Today her blood pressure was 114/68.  She is doing well with her breathing.   Ruth Roth is doing well. She thinks her weight is staying steady now.  She denies heart failure symptoms.  Her pressures have been good and her breathing is good as long as she stays inside.  She is eager to get back to class.   Expected Outcomes  Short: Continue to monitor weight daily.  Long: Continue to manage her heart failure.   Short: Continue to stay on top of weight.  Long: Continue to monitor risk factors.   Short: Contnue to do ADLs and watch weight.  Long: Continue to monitor risk factors.   Short: Continue to work on weight loss.  Long: Continue to manage heart failure  Short: Continue to work on weight loss.  Long: Contineu to monitor risk factors.      Exercise Goals and Review:   Exercise Goals Re-Evaluation: Exercise Goals Re-Evaluation    Row Name 06/03/18 1523 06/23/18 1015 07/14/18 1100 08/05/18 1347 09/22/18 1445     Exercise Goal Re-Evaluation   Exercise Goals Review  Increase Physical Activity;Increase Strength and Stamina;Understanding of Exercise Prescription  Increase Physical Activity;Increase Strength and Stamina;Understanding of Exercise Prescription  Increase Physical  Activity;Increase Strength and Stamina;Understanding of Exercise Prescription  Increase Physical Activity;Increase Strength and Stamina;Understanding of Exercise Prescription  Increase Physical Activity;Increase Strength and Stamina;Understanding of Exercise Prescription   Comments  Ruth Roth had been doing well in rehab and getting close to graduation.  She improved her post 6MWT by 366ft!!  She will need  to continue to walk at home.  She has not been getting out as much with the weather, but hopes to this weekend as it gets a little warmer out.  We will montior her progression.  Ruth Roth has not really been getting in her exercise. She has not been able to walk outside with the wind as it bothers her sinuses. She is going to try to get out today as the wind is a little less.   We will continue to encourage her to get out to walk.   Ruth Roth is still not doing her exercise.  She continues to avoid going outside but does do a little inside with her ADLs.  Today is not very windy so I encouraged her to get her coat on and go for a walk.   Ruth Roth has finally been able to get back to her walking again!  She has enjoyed getting out of the house more.   Ruth Roth has been walking in the house at home. She does not want to get out in the heat as it aggravates her breathing.  She is looking forward to getting back to rehab to finish up.   Expected Outcomes  Short: Continue to walk at home.  Long: Continue to maintain activity levels.   Short: Get outside to walk.  Long: Continue to maintain activity.   Short: Walk more often.   Short: Continue to walk daily  Long: Continue to rebuild activity levels.   Short: Continue to walk/exercise inside daily.  Long: Continue to maintain strength.      Nutrition & Weight - Outcomes:    Nutrition:   Nutrition Discharge:   Education Questionnaire Score:   Goals reviewed with patient; copy given to patient.

## 2018-11-26 ENCOUNTER — Other Ambulatory Visit: Payer: Self-pay

## 2018-11-26 ENCOUNTER — Other Ambulatory Visit
Admission: RE | Admit: 2018-11-26 | Discharge: 2018-11-26 | Disposition: A | Discharge status: Home / Self Care | Payer: Medicare Other | Source: Ambulatory Visit | Attending: Cardiovascular Disease | Admitting: Cardiovascular Disease

## 2018-11-26 DIAGNOSIS — Z20828 Contact with and (suspected) exposure to other viral communicable diseases: Secondary | ICD-10-CM | POA: Diagnosis not present

## 2018-11-26 DIAGNOSIS — Z01812 Encounter for preprocedural laboratory examination: Secondary | ICD-10-CM | POA: Diagnosis not present

## 2018-11-27 LAB — SARS CORONAVIRUS 2 (TAT 6-24 HRS): SARS Coronavirus 2: NEGATIVE

## 2018-11-30 ENCOUNTER — Other Ambulatory Visit: Payer: Self-pay

## 2018-11-30 ENCOUNTER — Ambulatory Visit: Payer: Medicare Other | Admitting: Certified Registered Nurse Anesthetist

## 2018-11-30 ENCOUNTER — Encounter
Admission: RE | Disposition: A | Discharge status: Home / Self Care | Payer: Self-pay | Source: Home / Self Care | Attending: Cardiovascular Disease

## 2018-11-30 ENCOUNTER — Ambulatory Visit
Admission: RE | Admit: 2018-11-30 | Discharge: 2018-11-30 | Disposition: A | Discharge status: Home / Self Care | Payer: Medicare Other | Attending: Cardiovascular Disease | Admitting: Cardiovascular Disease

## 2018-11-30 DIAGNOSIS — Z888 Allergy status to other drugs, medicaments and biological substances status: Secondary | ICD-10-CM | POA: Insufficient documentation

## 2018-11-30 DIAGNOSIS — Z7901 Long term (current) use of anticoagulants: Secondary | ICD-10-CM | POA: Insufficient documentation

## 2018-11-30 DIAGNOSIS — I110 Hypertensive heart disease with heart failure: Secondary | ICD-10-CM | POA: Diagnosis not present

## 2018-11-30 DIAGNOSIS — Z79890 Hormone replacement therapy (postmenopausal): Secondary | ICD-10-CM | POA: Diagnosis not present

## 2018-11-30 DIAGNOSIS — I4821 Permanent atrial fibrillation: Secondary | ICD-10-CM | POA: Diagnosis not present

## 2018-11-30 DIAGNOSIS — M1990 Unspecified osteoarthritis, unspecified site: Secondary | ICD-10-CM | POA: Insufficient documentation

## 2018-11-30 DIAGNOSIS — H409 Unspecified glaucoma: Secondary | ICD-10-CM | POA: Insufficient documentation

## 2018-11-30 DIAGNOSIS — I2510 Atherosclerotic heart disease of native coronary artery without angina pectoris: Secondary | ICD-10-CM | POA: Diagnosis not present

## 2018-11-30 DIAGNOSIS — I130 Hypertensive heart and chronic kidney disease with heart failure and stage 1 through stage 4 chronic kidney disease, or unspecified chronic kidney disease: Secondary | ICD-10-CM | POA: Diagnosis not present

## 2018-11-30 DIAGNOSIS — I5032 Chronic diastolic (congestive) heart failure: Secondary | ICD-10-CM | POA: Diagnosis not present

## 2018-11-30 DIAGNOSIS — N183 Chronic kidney disease, stage 3 (moderate): Secondary | ICD-10-CM | POA: Diagnosis not present

## 2018-11-30 DIAGNOSIS — E039 Hypothyroidism, unspecified: Secondary | ICD-10-CM | POA: Diagnosis not present

## 2018-11-30 DIAGNOSIS — I5033 Acute on chronic diastolic (congestive) heart failure: Secondary | ICD-10-CM | POA: Diagnosis not present

## 2018-11-30 DIAGNOSIS — I340 Nonrheumatic mitral (valve) insufficiency: Secondary | ICD-10-CM | POA: Insufficient documentation

## 2018-11-30 DIAGNOSIS — I4819 Other persistent atrial fibrillation: Secondary | ICD-10-CM | POA: Diagnosis not present

## 2018-11-30 DIAGNOSIS — E7800 Pure hypercholesterolemia, unspecified: Secondary | ICD-10-CM | POA: Diagnosis not present

## 2018-11-30 DIAGNOSIS — Z79899 Other long term (current) drug therapy: Secondary | ICD-10-CM | POA: Insufficient documentation

## 2018-11-30 DIAGNOSIS — I252 Old myocardial infarction: Secondary | ICD-10-CM | POA: Diagnosis not present

## 2018-11-30 DIAGNOSIS — I4891 Unspecified atrial fibrillation: Secondary | ICD-10-CM | POA: Diagnosis not present

## 2018-11-30 HISTORY — PX: CARDIOVERSION: SHX1299

## 2018-11-30 SURGERY — CARDIOVERSION
Anesthesia: General

## 2018-11-30 MED ORDER — PROPOFOL 10 MG/ML IV BOLUS
INTRAVENOUS | Status: DC | PRN
  Administered 2018-11-30: 10 mg via INTRAVENOUS
  Administered 2018-11-30: 40 mg via INTRAVENOUS

## 2018-11-30 MED ORDER — SODIUM CHLORIDE 0.9 % IV SOLN
INTRAVENOUS | Status: DC | PRN
  Administered 2018-11-30: 08:00:00 via INTRAVENOUS

## 2018-11-30 NOTE — Anesthesia Procedure Notes (Signed)
Date/Time: 11/30/2018 8:23 AM Performed by: Johnna Acosta, CRNA Pre-anesthesia Checklist: Patient identified, Emergency Drugs available, Suction available, Patient being monitored and Timeout performed Patient Re-evaluated:Patient Re-evaluated prior to induction Oxygen Delivery Method: Nasal cannula Preoxygenation: Pre-oxygenation with 100% oxygen Induction Type: IV induction

## 2018-11-30 NOTE — Anesthesia Preprocedure Evaluation (Signed)
Anesthesia Evaluation  Patient identified by MRN, date of birth, ID band Patient awake    Reviewed: Allergy & Precautions, NPO status , Patient's Chart, lab work & pertinent test results  History of Anesthesia Complications Negative for: history of anesthetic complications  Airway Mallampati: II  TM Distance: >3 FB Neck ROM: Full    Dental no notable dental hx.    Pulmonary shortness of breath, neg sleep apnea, neg COPD,    breath sounds clear to auscultation- rhonchi (-) wheezing      Cardiovascular hypertension, Pt. on medications + CAD, + Past MI and +CHF (preserved EF)  (-) Cardiac Stents and (-) CABG + dysrhythmias Atrial Fibrillation  Rhythm:Regular Rate:Normal - Systolic murmurs and - Diastolic murmurs Echo 06/10/94: - Left ventricle: The cavity size was normal. Systolic function was   normal. The estimated ejection fraction was in the range of 55%   to 60%. Wall motion was normal; there were no regional wall   motion abnormalities. The study is not technically sufficient to   allow evaluation of LV diastolic function. - Aortic valve: There was mild regurgitation. - Mitral valve: Calcified annulus. There was mild to moderate   regurgitation. - Left atrium: The atrium was moderately dilated. - Right atrium: The atrium was mildly dilated. - Pulmonary arteries: Systolic pressure was moderately elevated. PA   peak pressure: 52 mm Hg (S).    Neuro/Psych neg Seizures negative neurological ROS  negative psych ROS   GI/Hepatic PUD, GERD  ,(+) Hepatitis -, Autoimmune  Endo/Other  neg diabetesHypothyroidism   Renal/GU Renal InsufficiencyRenal disease     Musculoskeletal  (+) Arthritis ,   Abdominal (+) - obese,   Peds  Hematology  (+) anemia ,   Anesthesia Other Findings Past Medical History: No date: (HFpEF) heart failure with preserved ejection fraction ([place])     Comment:  a. 07/2017 Echo: EF 50-55%, no rwma,  mild AS/AI, mod MR,               mod dil LA, mod TR, PASP 31mmHg. No date: Autoimmune hepatitis ([place])     Comment:  followed by Dr [name] No date: Fibrocystic breast disease No date: Glaucoma No date: Hypercholesterolemia No date: Hypertension No date: Hypothyroidism No date: Inflammatory arthritis No date: MI (myocardial infarction) ([place]) No date: Neuropathy No date: Non-obstructive Coronary artery disease     Comment:  a. 05/2014 NSTEMI/Cath: LM nl, LAD mild dzs, LCX 60ost               hazy (? culprit), RCA mild dzs. EF 60% by echo-->Med Rx;               b. 08/2017 Cath: LM 30ost, LAD min irregs, LCX small,               40ost/p, RCA large, min irregs, RPDA/RPL1 nl, EF 50-55%.               4+MR. No date: Osteoarthritis No date: Permanent atrial fibrillation     Comment:  a. CHA2DS2VASc = 6-->Xarelto. 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 07/2017 Echo: Mod MR; b. 08/2017 Cath: 4+/Severe MR.   Reproductive/Obstetrics  Anesthesia Physical Anesthesia Plan  ASA: III  Anesthesia Plan: General   Post-op Pain Management:    Induction: Intravenous  PONV Risk Score and Plan: 2 and Propofol infusion  Airway Management Planned: Natural Airway  Additional Equipment:   Intra-op Plan:   Post-operative Plan:   Informed Consent: I have reviewed the patients History and Physical, chart, labs and discussed the procedure including the risks, benefits and alternatives for the proposed anesthesia with the patient or authorized representative who has indicated his/her understanding and acceptance.     Dental advisory given  Plan Discussed with: CRNA and Anesthesiologist  Anesthesia Plan Comments:         Anesthesia Quick Evaluation

## 2018-11-30 NOTE — CV Procedure (Signed)
Cardioversion note: A standard informed consent was obtained. Timeout was performed. The pads were placed in the anterior posterior fashion. The patient was given propofol by the anesthesia team.  Successful cardioversion was performed with a 200 J. The patient converted to sinus rhythm. Pre-and post EKGs were reviewed. The patient tolerated the procedure with no immediate complications.  Recommendations: Continue same medications and follow-up in 2-3 weeks.  

## 2018-11-30 NOTE — Transfer of Care (Signed)
Immediate Anesthesia Transfer of Care Note  Patient: Ruth Roth  Procedure(s) Performed: CARDIOVERSION (N/A )  Patient Location: PACU  Anesthesia Type:General  Level of Consciousness: drowsy  Airway & Oxygen Therapy: Patient Spontanous Breathing and Patient connected to nasal cannula oxygen  Post-op Assessment: Report given to RN and Post -op Vital signs reviewed and stable  Post vital signs: Reviewed and stable  Last Vitals:  Vitals Value Taken Time  BP 97/51 11/30/18 0835  Temp    Pulse 49 11/30/18 0835  Resp 15 11/30/18 0835  SpO2 100 % 11/30/18 0835    Last Pain:  Vitals:   11/30/18 0811  TempSrc: Oral         Complications: No apparent anesthesia complications

## 2018-11-30 NOTE — Anesthesia Postprocedure Evaluation (Signed)
Anesthesia Post Note  Patient: Ruth Roth  Procedure(s) Performed: CARDIOVERSION (N/A )  Patient location during evaluation: Other (specials recovery) Anesthesia Type: General Level of consciousness: awake and alert and oriented Pain management: pain level controlled Vital Signs Assessment: post-procedure vital signs reviewed and stable Respiratory status: spontaneous breathing, nonlabored ventilation and respiratory function stable Cardiovascular status: blood pressure returned to baseline and stable Postop Assessment: no signs of nausea or vomiting Anesthetic complications: no     Last Vitals:  Vitals:   11/30/18 0900 11/30/18 0915  BP: (!) 119/59 119/65  Pulse: (!) 50 (!) 50  Resp: 17 (!) 24  Temp:    SpO2: 100% 100%    Last Pain:  Vitals:   11/30/18 0915  TempSrc:   PainSc: 0-No pain                 Eben Choinski

## 2018-11-30 NOTE — Interval H&P Note (Signed)
History and Physical Interval Note:  11/30/2018 7:55 AM  Ruth Roth  has presented today for surgery, with the diagnosis of Cardioversion   Afib   Dr Andree Elk approved.  The various methods of treatment have been discussed with the patient and family. After consideration of risks, benefits and other options for treatment, the patient has consented to  Procedure(s): CARDIOVERSION (N/A) as a surgical intervention.  The patient's history has been reviewed, patient examined, no change in status, stable for surgery.  I have reviewed the patient's chart and labs.  Questions were answered to the patient's satisfaction.     [name]

## 2018-11-30 NOTE — Anesthesia Post-op Follow-up Note (Signed)
Anesthesia QCDR form completed.        

## 2018-12-01 ENCOUNTER — Telehealth: Payer: Self-pay | Admitting: Cardiovascular Disease

## 2018-12-01 ENCOUNTER — Encounter: Payer: Self-pay | Admitting: Cardiovascular Disease

## 2018-12-01 MED ORDER — METOPROLOL TARTRATE 50 MG PO TABS: 25.0000 mg | ORAL_TABLET | Freq: Two times a day (BID) | ORAL | Status: DC

## 2018-12-01 MED ORDER — AMIODARONE HCL 200 MG PO TABS: 200.0000 mg | ORAL_TABLET | Freq: Every day | ORAL | Status: DC

## 2018-12-01 NOTE — Telephone Encounter (Signed)
Patient daughter in law states patient had a cardioversion yesterday and is extremely weak and BP is 120/59 HR 45.  STAT if HR is under 50 or over 120 (normal HR is 60-100 beats per minute)  1) What is your heart rate? 45 after moving around was at 52  2) Do you have a log of your heart rate readings (document readings)? no  Do you have any other symptoms? weak

## 2018-12-01 NOTE — Telephone Encounter (Signed)
No DPR on file to talk with the patient's daughter in law [name]. Asked the patient for verbal permission. Patient sts that she does give verbal permission to talk with her daughter in law, but since she is already on the phone I can discuss Dr. Tyrell Antonio instruction with the patient directly.  Per Dr. Fletcher Anon the patient is to 1-HOLD Metoprolol this evening. Then resume Metoprolol tomorrow at 25mg  bid. ( patient will cut her 50mg  tablets in half)  2- REDUCE Amiodarone to 200mg  daily  Patient medication list updated.  Adv the patient to contact the office the end of the week if she does not feel any better or if she remain bradycardic. Patient verbalized understanding to the instructions given and voiced appreciation for the call.

## 2018-12-16 DIAGNOSIS — K7460 Unspecified cirrhosis of liver: Secondary | ICD-10-CM | POA: Diagnosis not present

## 2018-12-26 DIAGNOSIS — J3489 Other specified disorders of nose and nasal sinuses: Secondary | ICD-10-CM | POA: Diagnosis not present

## 2018-12-26 DIAGNOSIS — S022XXA Fracture of nasal bones, initial encounter for closed fracture: Secondary | ICD-10-CM | POA: Diagnosis not present

## 2018-12-26 DIAGNOSIS — W19XXXA Unspecified fall, initial encounter: Secondary | ICD-10-CM | POA: Diagnosis not present

## 2018-12-31 DIAGNOSIS — S022XXA Fracture of nasal bones, initial encounter for closed fracture: Secondary | ICD-10-CM | POA: Diagnosis not present

## 2019-01-04 DIAGNOSIS — N1832 Chronic kidney disease, stage 3b: Secondary | ICD-10-CM | POA: Diagnosis not present

## 2019-01-04 DIAGNOSIS — N2581 Secondary hyperparathyroidism of renal origin: Secondary | ICD-10-CM | POA: Diagnosis not present

## 2019-01-04 DIAGNOSIS — D631 Anemia in chronic kidney disease: Secondary | ICD-10-CM | POA: Diagnosis not present

## 2019-01-04 DIAGNOSIS — I10 Essential (primary) hypertension: Secondary | ICD-10-CM | POA: Diagnosis not present

## 2019-01-06 ENCOUNTER — Other Ambulatory Visit: Payer: Self-pay

## 2019-01-07 ENCOUNTER — Other Ambulatory Visit: Payer: Self-pay

## 2019-01-07 ENCOUNTER — Inpatient Hospital Stay: Payer: Medicare Other | Attending: Oncology

## 2019-01-07 DIAGNOSIS — I5032 Chronic diastolic (congestive) heart failure: Secondary | ICD-10-CM | POA: Insufficient documentation

## 2019-01-07 DIAGNOSIS — Z888 Allergy status to other drugs, medicaments and biological substances status: Secondary | ICD-10-CM | POA: Diagnosis not present

## 2019-01-07 DIAGNOSIS — M1990 Unspecified osteoarthritis, unspecified site: Secondary | ICD-10-CM | POA: Insufficient documentation

## 2019-01-07 DIAGNOSIS — Z8041 Family history of malignant neoplasm of ovary: Secondary | ICD-10-CM | POA: Diagnosis not present

## 2019-01-07 DIAGNOSIS — Z808 Family history of malignant neoplasm of other organs or systems: Secondary | ICD-10-CM | POA: Diagnosis not present

## 2019-01-07 DIAGNOSIS — I110 Hypertensive heart disease with heart failure: Secondary | ICD-10-CM | POA: Diagnosis not present

## 2019-01-07 DIAGNOSIS — I4821 Permanent atrial fibrillation: Secondary | ICD-10-CM | POA: Diagnosis not present

## 2019-01-07 DIAGNOSIS — Z801 Family history of malignant neoplasm of trachea, bronchus and lung: Secondary | ICD-10-CM | POA: Insufficient documentation

## 2019-01-07 DIAGNOSIS — D500 Iron deficiency anemia secondary to blood loss (chronic): Secondary | ICD-10-CM

## 2019-01-07 DIAGNOSIS — I2510 Atherosclerotic heart disease of native coronary artery without angina pectoris: Secondary | ICD-10-CM | POA: Diagnosis not present

## 2019-01-07 DIAGNOSIS — Z8711 Personal history of peptic ulcer disease: Secondary | ICD-10-CM | POA: Insufficient documentation

## 2019-01-07 DIAGNOSIS — D509 Iron deficiency anemia, unspecified: Secondary | ICD-10-CM | POA: Diagnosis not present

## 2019-01-07 DIAGNOSIS — R040 Epistaxis: Secondary | ICD-10-CM | POA: Diagnosis not present

## 2019-01-07 DIAGNOSIS — Z800 Family history of malignant neoplasm of digestive organs: Secondary | ICD-10-CM | POA: Diagnosis not present

## 2019-01-07 DIAGNOSIS — Z823 Family history of stroke: Secondary | ICD-10-CM | POA: Insufficient documentation

## 2019-01-07 DIAGNOSIS — Z7901 Long term (current) use of anticoagulants: Secondary | ICD-10-CM | POA: Diagnosis not present

## 2019-01-07 DIAGNOSIS — R5383 Other fatigue: Secondary | ICD-10-CM | POA: Insufficient documentation

## 2019-01-07 DIAGNOSIS — I252 Old myocardial infarction: Secondary | ICD-10-CM | POA: Diagnosis not present

## 2019-01-07 DIAGNOSIS — E039 Hypothyroidism, unspecified: Secondary | ICD-10-CM | POA: Diagnosis not present

## 2019-01-07 DIAGNOSIS — E7800 Pure hypercholesterolemia, unspecified: Secondary | ICD-10-CM | POA: Diagnosis not present

## 2019-01-07 DIAGNOSIS — Z79899 Other long term (current) drug therapy: Secondary | ICD-10-CM | POA: Insufficient documentation

## 2019-01-07 LAB — CBC WITH DIFFERENTIAL/PLATELET
Abs Immature Granulocytes: 0.03 10*3/uL (ref 0.00–0.07)
Basophils Absolute: 0 10*3/uL (ref 0.0–0.1)
Basophils Relative: 1 %
Eosinophils Absolute: 0.2 10*3/uL (ref 0.0–0.5)
Eosinophils Relative: 4 %
HCT: 41.1 % (ref 36.0–46.0)
Hemoglobin: 13 g/dL (ref 12.0–15.0)
Immature Granulocytes: 1 %
Lymphocytes Relative: 23 %
Lymphs Abs: 1.1 10*3/uL (ref 0.7–4.0)
MCH: 31.6 pg (ref 26.0–34.0)
MCHC: 31.6 g/dL (ref 30.0–36.0)
MCV: 100 fL (ref 80.0–100.0)
Monocytes Absolute: 0.6 10*3/uL (ref 0.1–1.0)
Monocytes Relative: 12 %
Neutro Abs: 3.1 10*3/uL (ref 1.7–7.7)
Neutrophils Relative %: 59 %
Platelets: 179 10*3/uL (ref 150–400)
RBC: 4.11 MIL/uL (ref 3.87–5.11)
RDW: 15.1 % (ref 11.5–15.5)
WBC: 5 10*3/uL (ref 4.0–10.5)
nRBC: 0 % (ref 0.0–0.2)

## 2019-01-07 LAB — IRON AND TIBC
Iron: 70 ug/dL (ref 28–170)
Saturation Ratios: 17 % (ref 10.4–31.8)
TIBC: 406 ug/dL (ref 250–450)
UIBC: 336 ug/dL

## 2019-01-07 LAB — FERRITIN: Ferritin: 74 ng/mL (ref 11–307)

## 2019-01-08 ENCOUNTER — Encounter: Payer: Self-pay | Admitting: Oncology

## 2019-01-08 ENCOUNTER — Other Ambulatory Visit: Payer: Self-pay

## 2019-01-08 ENCOUNTER — Inpatient Hospital Stay (HOSPITAL_BASED_OUTPATIENT_CLINIC_OR_DEPARTMENT_OTHER): Payer: Medicare Other | Admitting: Oncology

## 2019-01-08 ENCOUNTER — Inpatient Hospital Stay: Payer: Medicare Other

## 2019-01-08 VITALS — BP 131/79 | HR 81 | Temp 98.4°F | Resp 18 | Wt 139.3 lb

## 2019-01-08 DIAGNOSIS — D509 Iron deficiency anemia, unspecified: Secondary | ICD-10-CM | POA: Diagnosis not present

## 2019-01-08 DIAGNOSIS — D500 Iron deficiency anemia secondary to blood loss (chronic): Secondary | ICD-10-CM

## 2019-01-08 DIAGNOSIS — Z7901 Long term (current) use of anticoagulants: Secondary | ICD-10-CM

## 2019-01-08 DIAGNOSIS — I2510 Atherosclerotic heart disease of native coronary artery without angina pectoris: Secondary | ICD-10-CM | POA: Diagnosis not present

## 2019-01-08 DIAGNOSIS — R040 Epistaxis: Secondary | ICD-10-CM | POA: Diagnosis not present

## 2019-01-08 DIAGNOSIS — I110 Hypertensive heart disease with heart failure: Secondary | ICD-10-CM | POA: Diagnosis not present

## 2019-01-08 DIAGNOSIS — I4821 Permanent atrial fibrillation: Secondary | ICD-10-CM | POA: Diagnosis not present

## 2019-01-08 DIAGNOSIS — R5383 Other fatigue: Secondary | ICD-10-CM | POA: Diagnosis not present

## 2019-01-08 NOTE — Progress Notes (Signed)
Patient here for follow up. No concerns voiced.  °

## 2019-01-10 NOTE — Progress Notes (Signed)
Hematology/Oncology follow up note [facility] [phone] [phone]   Patient Care Team: Einar Pheasant, MD as PCP - General (Unknown Physician Specialty) [name], MD as PCP - Cardiology (Cardiology) Bary Castilla, Forest Gleason, MD (General Surgery)  REFERRING PROVIDER: Einar Pheasant, MD CHIEF COMPLAINTS/REASON FOR VISIT:  Evaluation of anemia  HISTORY OF PRESENTING ILLNESS:  Ruth Roth is a  85 y.o.  female with PMH listed below who was referred to me for evaluation of anemia Reviewed patient's recent labs, Labs revealed anemia with hemoglobin of 10.2 on 01/27/2018. MCV 88.9,   Reviewed patient's previous labs ordered by primary care physician's office, anemia is chronic onset , duration is since 2016, gradually declines. No aggravating or improving factors. She takes Xarelto for A Fib. Has recurrent epistaxis. History of IDA, cannot tolerate oral iron supplements.   Associated signs and symptoms: Patient reports fatigue.  deniesSOB with exertion.  Denies weight loss, easy bruising, hematochezia, hemoptysis, hematuria. Context: History of GI bleeding: deneis               History of Chronic kidney disease: CKD               Last colonoscopy: 2007 Patient has a history of autoimmune hepatitis/PBC  Follows up with gastroenterology.  # received IV Feraheme 510 mg x 2. INTERVAL HISTORY Ruth Roth is a 85 y.o. female who has above history reviewed by me today presents for follow up visit for management of anemia Problems and complaints are listed below:  Patient has previously received IV iron x2.  Tolerating well. She is on chronic anticoagulation with Xarelto for atrial fibrillation Denies hematochezia, hematuria, hematemesis, epistaxis, black tarry stool or easy bruising.  Today she reports doing well.  No fatigue.  Energy level is at baseline.  No new complaints.   Review of Systems  Constitutional: Negative for appetite  change, chills, fatigue and fever.  HENT:   Negative for hearing loss and voice change.   Eyes: Negative for eye problems.  Respiratory: Negative for chest tightness and cough.   Cardiovascular: Negative for chest pain.  Gastrointestinal: Negative for abdominal distention, abdominal pain and blood in stool.  Endocrine: Negative for hot flashes.  Genitourinary: Negative for difficulty urinating and frequency.   Musculoskeletal: Negative for arthralgias.  Skin: Negative for itching and rash.  Neurological: Negative for extremity weakness.  Hematological: Negative for adenopathy.  Psychiatric/Behavioral: Negative for confusion.    MEDICAL HISTORY:  Past Medical History:  Diagnosis Date   (HFpEF) heart failure with preserved ejection fraction ([place])    a. 07/2017 Echo: EF 50-55%, no rwma, mild AS/AI, mod MR, mod dil LA, mod TR, PASP 64mmHg.   Autoimmune hepatitis ([place])    followed by Dr [name]   Fibrocystic breast disease    Glaucoma    Hypercholesterolemia    Hypertension    Hypothyroidism    Inflammatory arthritis    MI (myocardial infarction) ([place])    Neuropathy    Non-obstructive Coronary artery disease    a. 05/2014 NSTEMI/Cath: LM nl, LAD mild dzs, LCX 60ost hazy (? culprit), RCA mild dzs. EF 60% by echo-->Med Rx; b. 08/2017 Cath: LM 30ost, LAD min irregs, LCX small, 40ost/p, RCA large, min irregs, RPDA/RPL1 nl, EF 50-55%. 4+MR.   Osteoarthritis    Permanent atrial fibrillation (HCC)    a. CHA2DS2VASc = 6-->Xarelto.   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 07/2017 Echo: Mod MR; b. 08/2017 Cath: 4+/Severe MR.    SURGICAL HISTORY: Past Surgical History:  Procedure Laterality Date   ABDOMINAL HYSTERECTOMY  1963   partial, secondary to fibroids   APPENDECTOMY     Billroth     CARDIAC CATHETERIZATION  05/12/14   [place]   CARDIOVERSION N/A 12/26/2016   Procedure:  CARDIOVERSION;  Surgeon: [name], MD;  Location: [facility] ORS;  Service: Cardiovascular;  Laterality: N/A;   CARDIOVERSION N/A 05/16/2017   Procedure: CARDIOVERSION;  Surgeon: [name], MD;  Location: [facility] ORS;  Service: Cardiovascular;  Laterality: N/A;   CARDIOVERSION N/A 06/09/2017   Procedure: CARDIOVERSION;  Surgeon: [name], MD;  Location: [facility] ORS;  Service: Cardiovascular;  Laterality: N/A;   CARDIOVERSION N/A 11/30/2018   Procedure: CARDIOVERSION;  Surgeon: [name], MD;  Location: [facility] ORS;  Service: Cardiovascular;  Laterality: N/A;   CHOLECYSTECTOMY  1998   COLONOSCOPY  2009   RIGHT/LEFT HEART CATH AND CORONARY ANGIOGRAPHY Bilateral 08/21/2017   Procedure: RIGHT/LEFT HEART CATH AND CORONARY ANGIOGRAPHY;  Surgeon: [name], MD;  Location: [facility];  Service: Cardiovascular;  Laterality: Bilateral;   TEE WITHOUT CARDIOVERSION N/A 08/25/2017   Procedure: TRANSESOPHAGEAL ECHOCARDIOGRAM (TEE);  Surgeon: Jolaine Artist, MD;  Location: [facility] ENDOSCOPY;  Service: Cardiovascular;  Laterality: N/A;   UPPER GI ENDOSCOPY  2004    SOCIAL HISTORY: Social History   Socioeconomic History   Marital status: Widowed    Spouse name: Not on file   Number of children: 2   Years of education: Not on file   Highest education level: Not on file  Occupational History   Occupation: retired  Scientist, product/process development strain: Not hard at [employer] insecurity    Worry: Never true    Inability: Never true   Transportation needs    Medical: No    Non-medical: No  Tobacco Use   Smoking status: Never Smoker   Smokeless tobacco: Never Used  Substance and Sexual Activity   Alcohol use: No    Alcohol/week: 0.0 standard drinks   Drug use: No   Sexual activity: Never  Lifestyle   Physical activity    Days per week: 3 days    Minutes per session: 40 min   Stress: Not at all  Relationships   Social connections     Talks on phone: More than three times a week    Gets together: Not on file    Attends religious service: Not on file    Active member of club or organization: Not on file    Attends meetings of clubs or organizations: Not on file    Relationship status: Not on file   Intimate partner violence    Fear of current or ex partner: No    Emotionally abused: No    Physically abused: No    Forced sexual activity: No  Other Topics Concern   Not on file  Social History Narrative   Not on file    FAMILY HISTORY: Family History  Problem Relation Age of Onset   Stroke Mother    Esophageal cancer Brother        also had lung cancer   Ovarian cancer Daughter    Colon cancer Daughter    Breast cancer Neg Hx     ALLERGIES:  is allergic to statins; haldol [haloperidol lactate]; librax [chlordiazepoxide-clidinium]; plavix [clopidogrel bisulfate]; and ramipril.  MEDICATIONS:  Current Outpatient Medications  Medication Sig Dispense Refill   amiodarone (PACERONE) 200 MG tablet Take 1 tablet (200 mg total) by mouth daily.     brimonidine-timolol (COMBIGAN) 0.2-0.5 % ophthalmic solution Place 1 drop into the left eye 2 (two) times daily.      calcitRIOL (ROCALTROL) 0.25 MCG capsule Take 0.25 mcg by mouth daily.      dorzolamide (TRUSOPT) 2 % ophthalmic solution Place 1 drop into the left eye 2 (two) times daily.     fluticasone (FLONASE) 50 MCG/ACT nasal spray Place 2 sprays into both nostrils daily as needed for allergies.      gabapentin (NEURONTIN) 300 MG capsule TAKE 2 CAPSULES BY MOUTH FOUR TIMES DAILY (Patient taking differently: Take 300-600 mg by mouth See admin instructions. 300 mg twice daily, 600 mg at bedtime) 720 capsule 0   ipratropium (ATROVENT) 0.06 % nasal spray Place 2 sprays into both nostrils daily as needed for rhinitis.      latanoprost (XALATAN) 0.005 % ophthalmic solution Place 1 drop into both eyes at bedtime.      levothyroxine (SYNTHROID) 112 MCG tablet  Take 112 mcg by mouth daily before breakfast.     losartan (COZAAR) 25 MG tablet Take 25 mg by mouth daily.      metoprolol tartrate (LOPRESSOR) 50 MG tablet Take 0.5 tablets (25 mg total) by mouth 2 (two) times daily.     Multiple Vitamin (MULTI-VITAMIN) tablet Take 1 tablet by mouth at bedtime.      pantoprazole (PROTONIX) 20 MG tablet Take 1 tablet (20 mg total) by mouth daily. 90 tablet 1   Rivaroxaban (XARELTO) 15 MG TABS tablet Take 1 tablet (15 mg total) by mouth daily with supper. 30 tablet 6   torsemide (DEMADEX) 20 MG tablet Take 20 mg by mouth every other day.     traMADol (ULTRAM) 50 MG tablet Take 1 tablet (50 mg total) by mouth daily as needed (for pain.). 30 tablet 0   ursodiol (ACTIGALL) 300 MG capsule Take 300-600 mg by mouth See admin instructions. Take 2 capsules (600 mg) daily in the morning   & 1 capsule (300 mg) at night.     Multiple Vitamins-Minerals (MULTIVITAMIN WITH MINERALS) tablet Take by mouth.     No current facility-administered medications for this visit.      PHYSICAL EXAMINATION: ECOG PERFORMANCE STATUS: 1 - Symptomatic but completely ambulatory Vitals:   01/08/19 1312  BP: 131/79  Pulse: 81  Resp: 18  Temp: 98.4 F (36.9 C)  SpO2: 97%   Filed Weights   01/08/19 1312  Weight: 139 lb 4.8 oz (63.2 kg)    Physical Exam Constitutional:      General: She is not in acute distress. HENT:     Head: Normocephalic and atraumatic.  Eyes:     General: No scleral icterus.    Pupils: Pupils are equal, round, and reactive to light.  Neck:     Musculoskeletal: Normal range of motion and neck supple.  Cardiovascular:     Rate and Rhythm: Normal rate and regular rhythm.     Heart sounds: Normal heart sounds.     Comments: Irregular rhythm.  Pulmonary:     Effort: Pulmonary effort is normal. No respiratory distress.     Breath sounds: No wheezing.  Abdominal:     General: Bowel sounds are normal. There is no distension.     Palpations:  Abdomen is soft. There is no mass.     Tenderness: There is  no abdominal tenderness.  Musculoskeletal: Normal range of motion.        General: No deformity.  Skin:    General: Skin is warm and dry.     Findings: No erythema or rash.  Neurological:     Mental Status: She is alert and oriented to person, place, and time.     Cranial Nerves: No cranial nerve deficit.     Coordination: Coordination normal.  Psychiatric:        Behavior: Behavior normal.        Thought Content: Thought content normal.      LABORATORY DATA:  I have reviewed the data as listed Lab Results  Component Value Date   WBC 5.0 01/07/2019   HGB 13.0 01/07/2019   HCT 41.1 01/07/2019   MCV 100.0 01/07/2019   PLT 179 01/07/2019   Recent Labs    01/27/18 1009  02/18/18 1456 02/25/18 1056 07/22/18 0945 07/24/18 0959 08/07/18 1051 09/14/18 11/19/18 1422  NA 136   < > 138 138 140 139 140 139 139  K 4.2   < > 4.1 4.7 5.2 No hemolysis seen* 4.7 4.8 4.4 5.2  CL 97   < > 102 103 108 103 109  --  109*  CO2 28   < > 24 27 25 25 23   --  19*  GLUCOSE 95   < > 96 96 88 96 80  --  93  BUN 63*   < > 45* 42* 66* 69* 28* 31* 34*  CREATININE 1.71*   < > 1.33* 1.35* 1.53* 1.75* 1.04 1.2* 1.27*  CALCIUM 8.4   < > 8.3* 8.7* 9.1 9.3 9.0  --  9.0  GFRNONAA  --    < > 36* 35*  --   --   --   --  38*  GFRAA  --    < > 42* 41*  --   --   --   --  44*  PROT 6.2  --   --   --  6.4  --   --   --   --   ALBUMIN 3.6  --   --   --  4.0  --   --   --   --   AST 14  --   --   --  14  --   --  22  --   ALT 14  --   --   --  16  --   --  20  --   ALKPHOS 119*  --   --   --  91  --   --  110  --   BILITOT 0.3  --   --   --  0.3  --   --   --   --   BILIDIR 0.1  --   --   --  0.1  --   --   --   --    < > = values in this interval not displayed.   Iron/TIBC/Ferritin/ %Sat    Component Value Date/Time   IRON 70 01/07/2019 1049   TIBC 406 01/07/2019 1049   FERRITIN 74 01/07/2019 1049   IRONPCTSAT 17 01/07/2019 1049      RADIOGRAPHIC STUDIES: I have personally reviewed the radiological images as listed and agreed with the findings in the report. 06/17/2018 Ultrasound abdomen complete Heterogeneous echotexture in the liver.  Extrahepatic and intrahepatic.  Duct dilation, stable since prior study. Small hyperechoic area within  the spleen the largest 7 mm.  Compatible with a hemangioma.  Stable Prior cholecystectomy. Mildly increased texture in the kidney suggest chronic kidney disease.  ASSESSMENT & PLAN:  1. Iron deficiency anemia due to chronic blood loss   2. Chronic anticoagulation   Labs reviewed and discussed with patient. Patient is not anemic.  Hemoglobin 13. Iron panel shows TIBC 406, iron saturation 17, ferritin 74.  Improved. No need for iron infusion today.  Patient was recently seen by Dr. [name] 12/16/2018.  Recommend to repeat labs, MD assessment in 6 months to ensure stability of iron level and hemoglobin level.  Orders Placed This Encounter  Procedures   CBC with Differential/Platelet    Standing Status:   Future    Standing Expiration Date:   01/08/2020   Iron and TIBC    Standing Status:   Future    Standing Expiration Date:   01/08/2020   Ferritin    Standing Status:   Future    Standing Expiration Date:   01/08/2020    All questions were answered. The patient knows to call the clinic with any problems questions or concerns. We spent sufficient time to discuss many aspect of care, questions were answered to patient's satisfaction. Total face to face encounter time for this patient visit was 15 min. >50% of the time was  spent in counseling and coordination of care.   Return of visit: 6 months   Earlie Server, MD, PhD Hematology Oncology [facility] Pager- [phone] 01/10/2019

## 2019-01-18 DIAGNOSIS — N1832 Chronic kidney disease, stage 3b: Secondary | ICD-10-CM | POA: Diagnosis not present

## 2019-01-21 ENCOUNTER — Other Ambulatory Visit: Payer: Self-pay

## 2019-01-21 ENCOUNTER — Ambulatory Visit (INDEPENDENT_AMBULATORY_CARE_PROVIDER_SITE_OTHER): Payer: Medicare Other | Admitting: Cardiovascular Disease

## 2019-01-21 ENCOUNTER — Encounter: Payer: Self-pay | Admitting: Cardiovascular Disease

## 2019-01-21 VITALS — BP 130/70 | HR 93 | Ht 63.0 in | Wt 140.5 lb

## 2019-01-21 DIAGNOSIS — I2510 Atherosclerotic heart disease of native coronary artery without angina pectoris: Secondary | ICD-10-CM

## 2019-01-21 DIAGNOSIS — I4821 Permanent atrial fibrillation: Secondary | ICD-10-CM | POA: Diagnosis not present

## 2019-01-21 DIAGNOSIS — I5032 Chronic diastolic (congestive) heart failure: Secondary | ICD-10-CM

## 2019-01-21 MED ORDER — TORSEMIDE 20 MG PO TABS: 20.0000 mg | ORAL_TABLET | Freq: Every day | ORAL | 5 refills | Status: DC

## 2019-01-21 NOTE — Progress Notes (Signed)
Cardiology Office Note   Date:  01/21/2019   ID:  [name], DOB Apr 20, 1928, MRN [id number]  PCP:  Ruth Pheasant, MD  Cardiologist:   Ruth Sacramento, MD   Chief Complaint  Patient presents with  . other    2 month follow up s/p cardioversion. Meds reviewed verbally with pt.      History of Present Illness: Ruth Roth is a 85 y.o. female who presents for a follow-up visit regarding coronary artery disease, chronic diastolic heart failure, mitral regurgitation and persistent atrial fibrillation .  She has known history of hypertension, peptic ulcer disease, fibrocystic breast disease, inflammatory arthritis, autoimmune hepatitis and neuropathy.  She had a small non-ST elevation myocardial infarction in March 2016 with acute diastolic heart failure after a GI illness.  Echocardiogram showed normal LV systolic function. Cardiac catheterization showed showed 60% ostial left circumflex hazy stenosis which was possibly the culprit. Mild LAD/RCA disease. She was treated medically.   She had 3 failed cardioversions in 2018/2019. She had worsening heart failure 2019.  Right and left cardiac catheterization in June 2019 showed  showed mild nonobstructive coronary artery disease.  Ejection fraction was 50 to 55% with severe mitral regurgitation.  Right heart catheterization showed severely elevated filling pressures with moderate to severe pulmonary hypertension and severely reduced cardiac output. I transferred the patient to [facility] where she was effectively diuresed with milrinone with subsequent improvement in symptoms.  She had a transesophageal echocardiogram done which showed normal LV systolic function with only mild mitral regurgitation.  The mitral regurgitation was felt to be functional with subsequent improvement after diuresis.  She was subsequently noted to be in sinus rhythm even without an antiarrhythmic medication.   During her last visit in September, she was noted to be  in atrial fibrillation with rapid ventricular response.  I added amiodarone 200 mg twice daily and proceeded with successful cardioversion.  Torsemide was decreased to every other day due to worsening renal function.  Since then, she noticed worsening exertional dyspnea, 4 pound weight gain and worsening leg edema.  She is noted to be back in atrial fibrillation today.  She did not feel much difference between sinus rhythm and A. fib.    Past Medical History:  Diagnosis Date  . (HFpEF) heart failure with preserved ejection fraction ([place])    a. 07/2017 Echo: EF 50-55%, no rwma, mild AS/AI, mod MR, mod dil LA, mod TR, PASP 35mmHg.  . Autoimmune hepatitis ([place])    followed by Dr Ruth Roth  . Fibrocystic breast disease   . Glaucoma   . Hypercholesterolemia   . Hypertension   . Hypothyroidism   . Inflammatory arthritis   . MI (myocardial infarction) ([place])   . Neuropathy   . Non-obstructive Coronary artery disease    a. 05/2014 NSTEMI/Cath: LM nl, LAD mild dzs, LCX 60ost hazy (? culprit), RCA mild dzs. EF 60% by echo-->Med Rx; b. 08/2017 Cath: LM 30ost, LAD min irregs, LCX small, 40ost/p, RCA large, min irregs, RPDA/RPL1 nl, EF 50-55%. 4+MR.  . Osteoarthritis   . Permanent atrial fibrillation (HCC)    a. CHA2DS2VASc = 6-->Xarelto.  . 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 Severe mitral regurgitation    a. 07/2017 Echo: Mod MR; b. 08/2017 Cath: 4+/Severe MR.    Past Surgical History:  Procedure Laterality Date  . ABDOMINAL HYSTERECTOMY  1963   partial, secondary to fibroids  . APPENDECTOMY    .  Billroth    . CARDIAC CATHETERIZATION  05/12/14   [facility]  . CARDIOVERSION N/A 12/26/2016   Procedure: CARDIOVERSION;  Surgeon: Ruth Hampshire, MD;  Location: [facility] ORS;  Service: Cardiovascular;  Laterality: N/A;  . CARDIOVERSION N/A 05/16/2017   Procedure: CARDIOVERSION;  Surgeon: Ruth Hampshire, MD;  Location: [facility] ORS;  Service:  Cardiovascular;  Laterality: N/A;  . CARDIOVERSION N/A 06/09/2017   Procedure: CARDIOVERSION;  Surgeon: Ruth Hampshire, MD;  Location: [facility] ORS;  Service: Cardiovascular;  Laterality: N/A;  . CARDIOVERSION N/A 11/30/2018   Procedure: CARDIOVERSION;  Surgeon: Ruth Hampshire, MD;  Location: [facility] ORS;  Service: Cardiovascular;  Laterality: N/A;  . CHOLECYSTECTOMY  1998  . COLONOSCOPY  2009  . RIGHT/LEFT HEART CATH AND CORONARY ANGIOGRAPHY Bilateral 08/21/2017   Procedure: RIGHT/LEFT HEART CATH AND CORONARY ANGIOGRAPHY;  Surgeon: Ruth Hampshire, MD;  Location: [place] CV LAB;  Service: Cardiovascular;  Laterality: Bilateral;  . TEE WITHOUT CARDIOVERSION N/A 08/25/2017   Procedure: TRANSESOPHAGEAL ECHOCARDIOGRAM (TEE);  Surgeon: Ruth Artist, MD;  Location: [facility] ENDOSCOPY;  Service: Cardiovascular;  Laterality: N/A;  . UPPER GI ENDOSCOPY  2004     Current Outpatient Medications  Medication Sig Dispense Refill  . amiodarone (PACERONE) 200 MG tablet Take 1 tablet (200 mg total) by mouth daily.    . brimonidine-timolol (COMBIGAN) 0.2-0.5 % ophthalmic solution Place 1 drop into the left eye 2 (two) times daily.     . calcitRIOL (ROCALTROL) 0.25 MCG capsule Take 0.25 mcg by mouth daily.     . dorzolamide (TRUSOPT) 2 % ophthalmic solution Place 1 drop into the left eye 2 (two) times daily.    . fluticasone (FLONASE) 50 MCG/ACT nasal spray Place 2 sprays into both nostrils daily as needed for allergies.     Marland Kitchen gabapentin (NEURONTIN) 300 MG capsule TAKE 2 CAPSULES BY MOUTH FOUR TIMES DAILY (Patient taking differently: Take 300-600 mg by mouth See admin instructions. 300 mg twice daily, 600 mg at bedtime) 720 capsule 0  . ipratropium (ATROVENT) 0.06 % nasal spray Place 2 sprays into both nostrils daily as needed for rhinitis.     Marland Kitchen latanoprost (XALATAN) 0.005 % ophthalmic solution Place 1 drop into both eyes at bedtime.     Marland Kitchen levothyroxine (SYNTHROID) 112 MCG tablet Take 112 mcg by mouth  daily before breakfast.    . losartan (COZAAR) 25 MG tablet Take 25 mg by mouth daily.     . metoprolol tartrate (LOPRESSOR) 50 MG tablet Take 0.5 tablets (25 mg total) by mouth 2 (two) times daily.    . Multiple Vitamin (MULTI-VITAMIN) tablet Take 1 tablet by mouth at bedtime.     . Multiple Vitamins-Minerals (MULTIVITAMIN WITH MINERALS) tablet Take by mouth.    . pantoprazole (PROTONIX) 20 MG tablet Take 1 tablet (20 mg total) by mouth daily. 90 tablet 1  . Rivaroxaban (XARELTO) 15 MG TABS tablet Take 1 tablet (15 mg total) by mouth daily with supper. 30 tablet 6  . torsemide (DEMADEX) 20 MG tablet Take 20 mg by mouth every other day.    . traMADol (ULTRAM) 50 MG tablet Take 1 tablet (50 mg total) by mouth daily as needed (for pain.). 30 tablet 0  . ursodiol (ACTIGALL) 300 MG capsule Take 300-600 mg by mouth See admin instructions. Take 2 capsules (600 mg) daily in the morning   & 1 capsule (300 mg) at night.     No current facility-administered medications for this visit.     Allergies:  Statins, Haldol [haloperidol lactate], Librax [chlordiazepoxide-clidinium], Plavix [clopidogrel bisulfate], and Ramipril    Social History:  The patient  reports that she has never smoked. She has never used smokeless tobacco. She reports that she does not drink alcohol or use drugs.   Family History:  The patient's family history includes Colon cancer in her daughter; Esophageal cancer in her brother; Ovarian cancer in her daughter; Stroke in her mother.    ROS:  Please see the history of present illness.   Otherwise, review of systems are positive for none.   All other systems are reviewed and negative.    PHYSICAL EXAM: VS:  BP 130/70 (BP Location: Left Arm, Patient Position: Sitting, Cuff Size: Normal)   Pulse 93   Ht 5\' 3"  (1.6 m)   Wt 140 lb 8 oz (63.7 kg)   BMI 24.89 kg/m  , BMI Body mass index is 24.89 kg/m. GEN: Well nourished, well developed, in no acute distress  HEENT: normal  Neck:  Mild JVD, carotid bruits, or masses Cardiac: Irregularly irregular ; no rubs, or gallops,no edema . 1/ 6 systolic ejection murmur at the aortic area.  Mild bilateral leg edema Respiratory: Clear to auscultation GI: soft, nontender, nondistended, + BS MS: no deformity or atrophy  Skin: warm and dry, no rash Neuro:  Strength and sensation are intact Psych: euthymic mood, full affect   EKG:  EKG is ordered today. The ekg ordered today demonstrates atrial fibrillation with ventricular rate of 93 bpm.  Low voltage.  Recent Labs: 02/11/2018: B Natriuretic Peptide 713.5 09/14/2018: ALT 20; TSH 0.90 11/19/2018: BUN 34; Creatinine, Ser 1.27; Potassium 5.2; Sodium 139 01/07/2019: Hemoglobin 13.0; Platelets 179    Lipid Panel    Component Value Date/Time   CHOL 197 07/22/2018 0945   TRIG 175.0 (H) 07/22/2018 0945   HDL 51.60 07/22/2018 0945   CHOLHDL 4 07/22/2018 0945   VLDL 35.0 07/22/2018 0945   LDLCALC 111 (H) 07/22/2018 0945   LDLDIRECT 143.5 12/17/2012 0955      Wt Readings from Last 3 Encounters:  01/21/19 140 lb 8 oz (63.7 kg)  01/08/19 139 lb 4.8 oz (63.2 kg)  11/30/18 135 lb (61.2 kg)       ASSESSMENT AND PLAN:  1.    Permanent atrial fibrillation: She underwent recent successful cardioversion but she is noted to be back in atrial fibrillation.  She failed multiple cardioversions in the past.  She likely has permanent atrial fibrillation and I recommend rate control.  I discontinued amiodarone.  Continue metoprolol and uptitrate as tolerated.  2. Chronic diastolic heart failure: She she seems to be mildly volume overloaded since torsemide was decreased to every other day.  I elected to change the dose back to 20 mg daily.  Check basic metabolic profile in 1 week.  She has been following with Dr. Holley Raring for chronic kidney disease.    3. Essential hypertension: Blood pressure is controlled on current medications.  4.  Functional mitral regurgitation: Severe MR was only in  the setting of severe volume overload.  MR was only mild on most recent transesophageal echo.   Disposition:   FU with me in 3 months.  Signed,  Ruth Sacramento, MD  01/21/2019 2:05 PM    [place]

## 2019-01-21 NOTE — Patient Instructions (Signed)
Medication Instructions:  Your physician has recommended you make the following change in your medication:   1) STOP Amiodarone  2) INCREASE Torsemide to 20mg  daily. An Rx has been sent to your pharmacy.  *If you need a refill on your cardiac medications before your next appointment, please call your pharmacy*  Lab Work: Your physician recommends that you return for lab work in: 1 week (bmet)  [place] to have your lab drawn when you see the [facility] on 01/28/19  If you have labs (blood work) drawn today and your tests are completely normal, you will receive your results only by: Marland Kitchen MyChart Message (if you have MyChart) OR . A paper copy in the mail If you have any lab test that is abnormal or we need to change your treatment, we will call you to review the results.  Testing/Procedures: None ordered  Follow-Up: At [facility], you and your health needs are our priority.  As part of our continuing mission to provide you with exceptional heart care, we have created designated Provider Care Teams.  These Care Teams include your primary Cardiologist (physician) and Advanced Practice Providers (APPs -  Physician Assistants and Nurse Practitioners) who all work together to provide you with the care you need, when you need it.  Your next appointment:   3 months  The format for your next appointment:   In Person  Provider:    You may see [name], MD or one of the following Advanced Practice Providers on your designated Care Team:    Murray Hodgkins, NP  Christell Faith, PA-C  Marrianne Mood, PA-C   Other Instructions N/A

## 2019-01-28 ENCOUNTER — Other Ambulatory Visit: Payer: Self-pay

## 2019-01-28 ENCOUNTER — Ambulatory Visit (HOSPITAL_COMMUNITY)
Admission: RE | Admit: 2019-01-28 | Discharge: 2019-01-28 | Disposition: A | Discharge status: Home / Self Care | Payer: Medicare Other | Source: Ambulatory Visit | Attending: Internal Medicine | Admitting: Internal Medicine

## 2019-01-28 VITALS — BP 114/70 | HR 69 | Wt 139.4 lb

## 2019-01-28 DIAGNOSIS — I4819 Other persistent atrial fibrillation: Secondary | ICD-10-CM | POA: Diagnosis not present

## 2019-01-28 DIAGNOSIS — Z888 Allergy status to other drugs, medicaments and biological substances status: Secondary | ICD-10-CM | POA: Diagnosis not present

## 2019-01-28 DIAGNOSIS — E7800 Pure hypercholesterolemia, unspecified: Secondary | ICD-10-CM | POA: Diagnosis not present

## 2019-01-28 DIAGNOSIS — I252 Old myocardial infarction: Secondary | ICD-10-CM | POA: Diagnosis not present

## 2019-01-28 DIAGNOSIS — N183 Chronic kidney disease, stage 3 unspecified: Secondary | ICD-10-CM | POA: Insufficient documentation

## 2019-01-28 DIAGNOSIS — E039 Hypothyroidism, unspecified: Secondary | ICD-10-CM | POA: Diagnosis not present

## 2019-01-28 DIAGNOSIS — N1832 Chronic kidney disease, stage 3b: Secondary | ICD-10-CM | POA: Diagnosis not present

## 2019-01-28 DIAGNOSIS — Z823 Family history of stroke: Secondary | ICD-10-CM | POA: Insufficient documentation

## 2019-01-28 DIAGNOSIS — Z8041 Family history of malignant neoplasm of ovary: Secondary | ICD-10-CM | POA: Diagnosis not present

## 2019-01-28 DIAGNOSIS — I2510 Atherosclerotic heart disease of native coronary artery without angina pectoris: Secondary | ICD-10-CM | POA: Insufficient documentation

## 2019-01-28 DIAGNOSIS — I5032 Chronic diastolic (congestive) heart failure: Secondary | ICD-10-CM | POA: Diagnosis not present

## 2019-01-28 DIAGNOSIS — Z79899 Other long term (current) drug therapy: Secondary | ICD-10-CM | POA: Diagnosis not present

## 2019-01-28 DIAGNOSIS — H409 Unspecified glaucoma: Secondary | ICD-10-CM | POA: Diagnosis not present

## 2019-01-28 DIAGNOSIS — I130 Hypertensive heart and chronic kidney disease with heart failure and stage 1 through stage 4 chronic kidney disease, or unspecified chronic kidney disease: Secondary | ICD-10-CM | POA: Diagnosis not present

## 2019-01-28 DIAGNOSIS — I340 Nonrheumatic mitral (valve) insufficiency: Secondary | ICD-10-CM | POA: Insufficient documentation

## 2019-01-28 DIAGNOSIS — Z8711 Personal history of peptic ulcer disease: Secondary | ICD-10-CM | POA: Insufficient documentation

## 2019-01-28 DIAGNOSIS — I480 Paroxysmal atrial fibrillation: Secondary | ICD-10-CM

## 2019-01-28 DIAGNOSIS — Z79890 Hormone replacement therapy (postmenopausal): Secondary | ICD-10-CM | POA: Insufficient documentation

## 2019-01-28 DIAGNOSIS — Z7901 Long term (current) use of anticoagulants: Secondary | ICD-10-CM | POA: Diagnosis not present

## 2019-01-28 DIAGNOSIS — Z800 Family history of malignant neoplasm of digestive organs: Secondary | ICD-10-CM | POA: Diagnosis not present

## 2019-01-28 LAB — BASIC METABOLIC PANEL
Anion gap: 12 (ref 5–15)
BUN: 48 mg/dL — ABNORMAL HIGH (ref 8–23)
CO2: 20 mmol/L — ABNORMAL LOW (ref 22–32)
Calcium: 8.5 mg/dL — ABNORMAL LOW (ref 8.9–10.3)
Chloride: 106 mmol/L (ref 98–111)
Creatinine, Ser: 1.79 mg/dL — ABNORMAL HIGH (ref 0.44–1.00)
GFR calc Af Amer: 29 mL/min — ABNORMAL LOW (ref >60)
GFR calc non Af Amer: 25 mL/min — ABNORMAL LOW (ref >60)
Glucose, Bld: 74 mg/dL (ref 70–99)
Potassium: 4.3 mmol/L (ref 3.5–5.1)
Sodium: 138 mmol/L (ref 135–145)

## 2019-01-28 LAB — BRAIN NATRIURETIC PEPTIDE: B Natriuretic Peptide: 359 pg/mL — ABNORMAL HIGH (ref 0.0–100.0)

## 2019-01-28 NOTE — Progress Notes (Signed)
Advanced Heart Failure Clinic Note   Date:  01/28/2019   ID:  Ruth Roth, DOB [birth year], MRN [id number]  Location: Home  Provider location: [place] [facility] Type of Visit: Established patient  PCP:  Einar Pheasant, MD  Cardiologist:  [name], MD Primary HF:   Chief Complaint: Heart Failure follow-up   History of Present Illness:  Ruth Roth is a 85 y.o. female with a history of 85 year old female with a history of coronary artery disease, chronic diastolic congestive heart failure, persistent atrial fibrillation, hypertension, peptic ulcer disease, fibrocystic breast disease, inflammatory arthritis, autoimmune hepatitis, and neuropathy.  Admitted to [facility] 08/21/17 s/p R/LHC which showed significantly elevated filling pressures, severe MR, and low cardiac output, and then transferred to [facility] 6/14 for consideration of MitraClip. AKI noted on admission felt to be due to cardiorenal syndrome. She required milrinone and lasix for diuresis. Diuresed 25 lbs and transitioned to 20 mg torsemide daily. Case and images reviewed at length with structural heart team. MR felt to be truly functional with improvement from hemodynamic optimization. Not felt to be MitraClip candidate at this time.Discharge weight: 121 lbs.   She presents for routine f/u today. Saw Dr. Fletcher Anon on 11/19/18 and was back in AF with RVR. Amio 200 bid started. Underwent DC-CV on 9/210/20. PCP cut back torsemide to 20 qOD due to AKI but developed volume overload. Torsemide increased back to 20 daily. Saw Dr. Fletcher Anon last week and was back in AF with rate 93. Amio stopped. Feels good. Doing all ADLs without problem. Denies palpitations, CP, orthopnea or PND. No bleeding on Xarelto.     Cardiac Studies: Echo 10/09/17: EF 55-60%, no AI, mild to mod MR, LA moderately dilated, RA mildly dilated, RV normal, PA peak pressure 52 mmHg  [facility] 08/21/2017 - Mild non-obstructive CAD RHC  Procedural Findings:(Obtained from Scan 08/21/17 at 1030) Hemodynamics (mmHg) RA mean17 RV68/11 (19) PA62/27 (44) PCWP41 AO147/67 (102) Cardiac Output(Fick) 2.45 Cardiac Index(Fick) 1.46 PVR 1.23 WU  Echo 08/01/17 LVEF 50-55%, Mild AS, Mod MR, Mod LAE, Mild RAE, Mod TR, PA peak pressure 65 mm Hg.MR ++ calcified, and RV mild/moderately reduced function.      Past Medical History:  Diagnosis Date  . (HFpEF) heart failure with preserved ejection fraction ([place])    a. 07/2017 Echo: EF 50-55%, no rwma, mild AS/AI, mod MR, mod dil LA, mod TR, PASP 44mmHg.  . Autoimmune hepatitis ([place])    followed by Dr Ruth Roth  . Fibrocystic breast disease   . Glaucoma   . Hypercholesterolemia   . Hypertension   . Hypothyroidism   . Inflammatory arthritis   . MI (myocardial infarction) (Ruth Roth)   . Neuropathy   . Non-obstructive Coronary artery disease    a. 05/2014 NSTEMI/Cath: LM nl, LAD mild dzs, LCX 60ost hazy (? culprit), RCA mild dzs. EF 60% by echo-->Med Rx; b. 08/2017 Cath: LM 30ost, LAD min irregs, LCX small, 40ost/p, RCA large, min irregs, RPDA/RPL1 nl, EF 50-55%. 4+MR.  . Osteoarthritis   . Permanent atrial fibrillation (HCC)    a. CHA2DS2VASc = 6-->Xarelto.  . 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 Severe mitral regurgitation    a. 07/2017 Echo: Mod MR; b. 08/2017 Cath: 4+/Severe MR.   Past Surgical History:  Procedure Laterality Date  . ABDOMINAL HYSTERECTOMY  1963   partial, secondary to fibroids  . APPENDECTOMY    . Billroth    .  CARDIAC CATHETERIZATION  05/12/14   [facility]  . CARDIOVERSION N/A 12/26/2016   Procedure: CARDIOVERSION;  Surgeon: Ruth Hampshire, MD;  Location: [facility] ORS;  Service: Cardiovascular;  Laterality: N/A;  . CARDIOVERSION N/A 05/16/2017   Procedure: CARDIOVERSION;  Surgeon: Ruth Hampshire, MD;  Location: [facility] ORS;  Service: Cardiovascular;  Laterality: N/A;  . CARDIOVERSION N/A 06/09/2017    Procedure: CARDIOVERSION;  Surgeon: Ruth Hampshire, MD;  Location: [facility] ORS;  Service: Cardiovascular;  Laterality: N/A;  . CARDIOVERSION N/A 11/30/2018   Procedure: CARDIOVERSION;  Surgeon: Ruth Hampshire, MD;  Location: [facility] ORS;  Service: Cardiovascular;  Laterality: N/A;  . CHOLECYSTECTOMY  1998  . COLONOSCOPY  2009  . RIGHT/LEFT HEART CATH AND CORONARY ANGIOGRAPHY Bilateral 08/21/2017   Procedure: RIGHT/LEFT HEART CATH AND CORONARY ANGIOGRAPHY;  Surgeon: Ruth Hampshire, MD;  Location: [place] CV LAB;  Service: Cardiovascular;  Laterality: Bilateral;  . TEE WITHOUT CARDIOVERSION N/A 08/25/2017   Procedure: TRANSESOPHAGEAL ECHOCARDIOGRAM (TEE);  Surgeon: Ruth Artist, MD;  Location: [facility] ENDOSCOPY;  Service: Cardiovascular;  Laterality: N/A;  . UPPER GI ENDOSCOPY  2004     Current Outpatient Medications  Medication Sig Dispense Refill  . brimonidine-timolol (COMBIGAN) 0.2-0.5 % ophthalmic solution Place 1 drop into the left eye 2 (two) times daily.     . calcitRIOL (ROCALTROL) 0.25 MCG capsule Take 0.25 mcg by mouth daily.     . dorzolamide (TRUSOPT) 2 % ophthalmic solution Place 1 drop into the left eye 2 (two) times daily.    . fluticasone (FLONASE) 50 MCG/ACT nasal spray Place 2 sprays into both nostrils daily as needed for allergies.     Ruth Roth gabapentin (NEURONTIN) 300 MG capsule TAKE 2 CAPSULES BY MOUTH FOUR TIMES DAILY (Patient taking differently: Take 300-600 mg by mouth See admin instructions. 300 mg twice daily, 600 mg at bedtime) 720 capsule 0  . ipratropium (ATROVENT) 0.06 % nasal spray Place 2 sprays into both nostrils daily as needed for rhinitis.     Ruth Roth latanoprost (XALATAN) 0.005 % ophthalmic solution Place 1 drop into both eyes at bedtime.     Ruth Roth levothyroxine (SYNTHROID) 112 MCG tablet Take 112 mcg by mouth daily before breakfast.    . losartan (COZAAR) 25 MG tablet Take 25 mg by mouth daily.     . metoprolol tartrate (LOPRESSOR) 50 MG tablet Take 0.5 tablets  (25 mg total) by mouth 2 (two) times daily.    . Multiple Vitamin (MULTI-VITAMIN) tablet Take 1 tablet by mouth at bedtime.     . Multiple Vitamins-Minerals (MULTIVITAMIN WITH MINERALS) tablet Take by mouth.    . pantoprazole (PROTONIX) 20 MG tablet Take 1 tablet (20 mg total) by mouth daily. 90 tablet 1  . Rivaroxaban (XARELTO) 15 MG TABS tablet Take 1 tablet (15 mg total) by mouth daily with supper. 30 tablet 6  . torsemide (DEMADEX) 20 MG tablet Take 1 tablet (20 mg total) by mouth daily. 30 tablet 5  . traMADol (ULTRAM) 50 MG tablet Take 1 tablet (50 mg total) by mouth daily as needed (for pain.). 30 tablet 0  . ursodiol (ACTIGALL) 300 MG capsule Take 300-600 mg by mouth See admin instructions. Take 2 capsules (600 mg) daily in the morning   & 1 capsule (300 mg) at night.     No current facility-administered medications for this encounter.     Allergies:   Statins, Haldol [haloperidol lactate], Librax [chlordiazepoxide-clidinium], Plavix [clopidogrel bisulfate], and Ramipril   Social History:  The patient  reports that she has never smoked. She has never used smokeless tobacco. She reports that she does not drink alcohol or use drugs.   Family History:  The patient's family history includes Colon cancer in her daughter; Esophageal cancer in her brother; Ovarian cancer in her daughter; Stroke in her mother.   ROS:  Please see the history of present illness.   All other systems are personally reviewed and negative.   Vitals:   01/28/19 1401  BP: 114/70  Pulse: 69  SpO2: 97%  Weight: 63.2 kg (139 lb 6 oz)    Exam:   General:  Elderly. No resp difficulty HEENT: normal Neck: supple. JVP 6-7 . Carotids 2+ bilat; no bruits. No lymphadenopathy or thryomegaly appreciated. Cor: PMI nondisplaced. Irregular rate & rhythm. Soft MR Lungs: clear Abdomen: soft, nontender, nondistended. No hepatosplenomegaly. No bruits or masses. Good bowel sounds. Extremities: no cyanosis, clubbing, rash,  edema Neuro: alert & orientedx3, cranial nerves grossly intact. moves all 4 extremities w/o difficulty. Affect pleasant   Recent Labs: 02/11/2018: B Natriuretic Peptide 713.5 09/14/2018: ALT 20; TSH 0.90 11/19/2018: BUN 34; Creatinine, Ser 1.27; Potassium 5.2; Sodium 139 01/07/2019: Hemoglobin 13.0; Platelets 179  Personally reviewed   Wt Readings from Last 3 Encounters:  01/28/19 63.2 kg (139 lb 6 oz)  01/21/19 63.7 kg (140 lb 8 oz)  01/08/19 63.2 kg (139 lb 4.8 oz)    ECG: AF 83 bpm Personally reviewed    ASSESSMENT AND PLAN:  1.Chronic diastolic congestive heart failure with low output: - Echo 08/01/17 LVEF 50-55%, Mild AS, Mod MR, Mod LAE, Mild RAE, Mod TR, RV moderately HK PA peak pressure 65 mm Hg. - Cath 08/21/17 with signficantly elevated filling pressures, severe MR, and low cardiac output. Had cardiorenal syndrome on admit with severe MR and RV failure and required milrinone - Echo 10/09/17: EF 55-60%, mild to mod MR - Doing well despite recurrent AF. NYHA II. Volume status stable. Continue Torsemide 20 daily (failed reduction to 20 qOD). Ok to take extra as needed - Continue Lopressor 525 bid, can increase as needed  2. Severe mitral regurgitation: - Echo 08/01/17 LVEF 50-55%, Mild AS, Mod MR, Mod LAE, Mild RAE, Mod TR, PA peak pressure 65 mm Hg.MR ++ calcified, and RV mild/moderately reduced function. - TEE 6/17 showed only mild MR with restricted posterior leaflet.  -Echo 10/09/17: EF 55-60%, mild to mod MR -Likely functional MR and MR improved with hemodynamic optimization.  Will follow closely if fails close medical therapy consider MitraClip - Repeat echo at [facility]  3. Persistent atrial fibrillation: - Has previously failed amiodarone and DCCV x3.  - Failed DC-CV with amio on 11/30/18 - Doubt we will be able to maintain NSR. Seems to be tolerating AF ok. If can maintain rate control will need AVN ablation and CRT  - Continue Lopressor 25 bid. Titrate as needed -  Continue Xarelto. No bleeding. - Seen by Dr. Rayann Heman 12/01/17. He did not recommend either afib ablation or AV node ablation + PPM at that time  4. Essential hypertension: -Stable. Well controlled.   5. CKD 3 with h/o AKI - Creatinine 1.27 on 11/19/18 - Repeat today   Signed, Glori Bickers, MD  01/28/2019 2:05 PM  Advanced Heart Failure [place] [address] [facility] and [place] [address] [phone] (office) [phone] (fax)

## 2019-01-28 NOTE — Patient Instructions (Signed)
Labs done today, we will notify you of abnormal results  Your physician has requested that you have an echocardiogram. Echocardiography is a painless test that uses sound waves to create images of your heart. It provides your doctor with information about the size and shape of your heart and how well your heart's chambers and valves are working. This procedure takes approximately one hour. There are no restrictions for this procedure.  Your physician recommends that you schedule a follow-up appointment in: 4 months  If you have any questions or concerns before your next appointment please send [place] a message through [place] or call our office at [phone].  At the [facility], you and your health needs are our priority. As part of our continuing mission to provide you with exceptional heart care, we have created designated Provider Care Teams. These Care Teams include your primary Cardiologist (physician) and Advanced Practice Providers (APPs- Physician Assistants and Nurse Practitioners) who all work together to provide you with the care you need, when you need it.   You may see any of the following providers on your designated Care Team at your next follow up: Marland Kitchen Dr Glori Bickers . Dr Loralie Champagne . Darrick Grinder, NP . Lyda Jester, PA   Please be sure to bring in all your medications bottles to every appointment.

## 2019-02-03 NOTE — Addendum Note (Signed)
Encounter addended by: Scarlette Calico, RN on: 02/03/2019 2:28 PM  Actions taken: Order list changed, Diagnosis association updated

## 2019-02-10 ENCOUNTER — Other Ambulatory Visit: Payer: Self-pay

## 2019-02-12 ENCOUNTER — Other Ambulatory Visit (INDEPENDENT_AMBULATORY_CARE_PROVIDER_SITE_OTHER): Payer: Medicare Other

## 2019-02-12 ENCOUNTER — Other Ambulatory Visit: Payer: Medicare Other

## 2019-02-12 ENCOUNTER — Other Ambulatory Visit: Payer: Self-pay

## 2019-02-12 DIAGNOSIS — Z23 Encounter for immunization: Secondary | ICD-10-CM

## 2019-02-12 DIAGNOSIS — E7800 Pure hypercholesterolemia, unspecified: Secondary | ICD-10-CM

## 2019-02-12 DIAGNOSIS — I10 Essential (primary) hypertension: Secondary | ICD-10-CM

## 2019-02-12 DIAGNOSIS — E039 Hypothyroidism, unspecified: Secondary | ICD-10-CM

## 2019-02-12 DIAGNOSIS — D7589 Other specified diseases of blood and blood-forming organs: Secondary | ICD-10-CM

## 2019-02-12 LAB — LIPID PANEL
Cholesterol: 195 mg/dL (ref 0–200)
HDL: 57.2 mg/dL (ref >39.00)
LDL Cholesterol: 107 mg/dL — ABNORMAL HIGH (ref 0–99)
NonHDL: 138.06
Total CHOL/HDL Ratio: 3
Triglycerides: 153 mg/dL — ABNORMAL HIGH (ref 0.0–149.0)
VLDL: 30.6 mg/dL (ref 0.0–40.0)

## 2019-02-12 LAB — HEPATIC FUNCTION PANEL
ALT: 16 U/L (ref 0–35)
AST: 12 U/L (ref 0–37)
Albumin: 4.3 g/dL (ref 3.5–5.2)
Alkaline Phosphatase: 87 U/L (ref 39–117)
Bilirubin, Direct: 0.1 mg/dL (ref 0.0–0.3)
Total Bilirubin: 0.5 mg/dL (ref 0.2–1.2)
Total Protein: 6.7 g/dL (ref 6.0–8.3)

## 2019-02-12 LAB — BASIC METABOLIC PANEL
BUN: 44 mg/dL — ABNORMAL HIGH (ref 6–23)
CO2: 21 mEq/L (ref 19–32)
Calcium: 8.8 mg/dL (ref 8.4–10.5)
Chloride: 107 mEq/L (ref 96–112)
Creatinine, Ser: 1.61 mg/dL — ABNORMAL HIGH (ref 0.40–1.20)
GFR: 30.15 mL/min — ABNORMAL LOW (ref >60.00)
Glucose, Bld: 95 mg/dL (ref 70–99)
Potassium: 4.4 mEq/L (ref 3.5–5.1)
Sodium: 140 mEq/L (ref 135–145)

## 2019-02-12 LAB — TSH: TSH: 2 u[IU]/mL (ref 0.35–4.50)

## 2019-02-14 LAB — FOLATE RBC: RBC Folate: >1000 ng/mL RBC (ref >280)

## 2019-02-16 ENCOUNTER — Ambulatory Visit (INDEPENDENT_AMBULATORY_CARE_PROVIDER_SITE_OTHER): Payer: Medicare Other | Admitting: Internal Medicine

## 2019-02-16 ENCOUNTER — Other Ambulatory Visit: Payer: Self-pay

## 2019-02-16 DIAGNOSIS — E039 Hypothyroidism, unspecified: Secondary | ICD-10-CM | POA: Diagnosis not present

## 2019-02-16 DIAGNOSIS — I5032 Chronic diastolic (congestive) heart failure: Secondary | ICD-10-CM | POA: Diagnosis not present

## 2019-02-16 DIAGNOSIS — N183 Chronic kidney disease, stage 3 unspecified: Secondary | ICD-10-CM | POA: Diagnosis not present

## 2019-02-16 DIAGNOSIS — K219 Gastro-esophageal reflux disease without esophagitis: Secondary | ICD-10-CM | POA: Diagnosis not present

## 2019-02-16 DIAGNOSIS — J329 Chronic sinusitis, unspecified: Secondary | ICD-10-CM

## 2019-02-16 DIAGNOSIS — D500 Iron deficiency anemia secondary to blood loss (chronic): Secondary | ICD-10-CM | POA: Diagnosis not present

## 2019-02-16 DIAGNOSIS — I2510 Atherosclerotic heart disease of native coronary artery without angina pectoris: Secondary | ICD-10-CM | POA: Diagnosis not present

## 2019-02-16 DIAGNOSIS — D509 Iron deficiency anemia, unspecified: Secondary | ICD-10-CM

## 2019-02-16 DIAGNOSIS — E7800 Pure hypercholesterolemia, unspecified: Secondary | ICD-10-CM

## 2019-02-16 DIAGNOSIS — I10 Essential (primary) hypertension: Secondary | ICD-10-CM | POA: Diagnosis not present

## 2019-02-16 DIAGNOSIS — M545 Low back pain, unspecified: Secondary | ICD-10-CM

## 2019-02-16 DIAGNOSIS — I4821 Permanent atrial fibrillation: Secondary | ICD-10-CM

## 2019-02-16 DIAGNOSIS — K754 Autoimmune hepatitis: Secondary | ICD-10-CM

## 2019-02-16 MED ORDER — TRAMADOL HCL 50 MG PO TABS: 50.0000 mg | ORAL_TABLET | Freq: Every day | ORAL | 0 refills | Status: DC | PRN

## 2019-02-16 MED ORDER — IPRATROPIUM BROMIDE 0.06 % NA SOLN: 1.0000 | Freq: Every day | NASAL | 0 refills | Status: DC | PRN

## 2019-02-16 MED ORDER — AMOXICILLIN 875 MG PO TABS: 875.0000 mg | ORAL_TABLET | Freq: Two times a day (BID) | ORAL | 0 refills | Status: DC

## 2019-02-16 NOTE — Progress Notes (Signed)
[name], female   DOB: 1929-02-23, 85 y.o.   MRN: [id number]   Virtual Visit via telephone Note  This visit type was conducted due to national recommendations for restrictions regarding the COVID-19 pandemic (e.g. social distancing).  This format is felt to be most appropriate for this patient at this time.  All issues noted in this document were discussed and addressed.  No physical exam was performed (except for noted visual exam findings with Video Visits).   I connected with Ruth Roth by telephone and verified that I am speaking with the correct person using two identifiers. Location patient: home Location provider: work Persons participating in the virtual visit: patient, provider  I discussed the limitations, risks, security and privacy concerns of performing an evaluation and management service by telephone and the availability of in person appointments.  The patient expressed understanding and agreed to proceed.   Reason for visit: scheduled follow up.   HPI: She reports she is doing relatively well.  Does report increased sinus congestion and pressure.  Increased nasal congestion.  Increased mucus production and drainage.  Sore throat.  Symptoms started one week ago.  Persistent symptoms.  Feels similar to previous sinus infection.  No chest congestion or increased cough.  Breathing overall stable.  Has been seeing hematology - Dr Tasia Catchings.  Stable.  Last hgb 13.  Saw neprhology 10/20.  Stable.  Recommended f/u in 4 months.  Persistent increased back pain.  Takes tramadol.  Discussed pain clinic.  She is agreeable for referral.  Sees cardiology - afib with RVR.  Reports no increased heart rate or palpitations.  Back to daily torsemide.  Off amiodarone.  Feels heart stable.     ROS: See pertinent positives and negatives per HPI.  Past Medical History:  Diagnosis Date  . (HFpEF) heart failure with preserved ejection fraction ([place])    a. 07/2017 Echo: EF 50-55%, no rwma, mild  AS/AI, mod MR, mod dil LA, mod TR, PASP 72mmHg.  . Autoimmune hepatitis ([place])    followed by Dr [name]  . Fibrocystic breast disease   . Glaucoma   . Hypercholesterolemia   . Hypertension   . Hypothyroidism   . Inflammatory arthritis   . MI (myocardial infarction) ([place])   . Neuropathy   . Non-obstructive Coronary artery disease    a. 05/2014 NSTEMI/Cath: LM nl, LAD mild dzs, LCX 60ost hazy (? culprit), RCA mild dzs. EF 60% by echo-->Med Rx; b. 08/2017 Cath: LM 30ost, LAD min irregs, LCX small, 40ost/p, RCA large, min irregs, RPDA/RPL1 nl, EF 50-55%. 4+MR.  . Osteoarthritis   . Permanent atrial fibrillation (HCC)    a. CHA2DS2VASc = 6-->Xarelto.  . 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 Severe mitral regurgitation    a. 07/2017 Echo: Mod MR; b. 08/2017 Cath: 4+/Severe MR.    Past Surgical History:  Procedure Laterality Date  . ABDOMINAL HYSTERECTOMY  1963   partial, secondary to fibroids  . APPENDECTOMY    . Billroth    . CARDIAC CATHETERIZATION  05/12/14   [facility]  . CARDIOVERSION N/A 12/26/2016   Procedure: CARDIOVERSION;  Surgeon: [name], MD;  Location: [facility] ORS;  Service: Cardiovascular;  Laterality: N/A;  . CARDIOVERSION N/A 05/16/2017   Procedure: CARDIOVERSION;  Surgeon: [name], MD;  Location: [facility] ORS;  Service: Cardiovascular;  Laterality: N/A;  . CARDIOVERSION N/A 06/09/2017   Procedure: CARDIOVERSION;  Surgeon: [name], MD;  Location: [facility] ORS;  Service: Cardiovascular;  Laterality: N/A;  . CARDIOVERSION N/A 11/30/2018   Procedure: CARDIOVERSION;  Surgeon: [name], MD;  Location: [facility] ORS;  Service: Cardiovascular;  Laterality: N/A;  . CHOLECYSTECTOMY  1998  . COLONOSCOPY  2009  . RIGHT/LEFT HEART CATH AND CORONARY ANGIOGRAPHY Bilateral 08/21/2017   Procedure: RIGHT/LEFT HEART CATH AND CORONARY ANGIOGRAPHY;  Surgeon: [name], MD;  Location: [facility];  Service:  Cardiovascular;  Laterality: Bilateral;  . TEE WITHOUT CARDIOVERSION N/A 08/25/2017   Procedure: TRANSESOPHAGEAL ECHOCARDIOGRAM (TEE);  Surgeon: Jolaine Artist, MD;  Location: [facility] ENDOSCOPY;  Service: Cardiovascular;  Laterality: N/A;  . UPPER GI ENDOSCOPY  2004    Family History  Problem Relation Age of Onset  . Stroke Mother   . Esophageal cancer Brother        also had lung cancer  . Ovarian cancer Daughter   . Colon cancer Daughter   . Breast cancer Neg Hx     SOCIAL HX: reviewed.    Current Outpatient Medications:  .  amoxicillin (AMOXIL) 875 MG tablet, Take 1 tablet (875 mg total) by mouth 2 (two) times daily., Disp: 20 tablet, Rfl: 0 .  brimonidine-timolol (COMBIGAN) 0.2-0.5 % ophthalmic solution, Place 1 drop into the left eye 2 (two) times daily. , Disp: , Rfl:  .  calcitRIOL (ROCALTROL) 0.25 MCG capsule, Take 0.25 mcg by mouth daily. , Disp: , Rfl:  .  dorzolamide (TRUSOPT) 2 % ophthalmic solution, Place 1 drop into the left eye 2 (two) times daily., Disp: , Rfl:  .  fluticasone (FLONASE) 50 MCG/ACT nasal spray, Place 2 sprays into both nostrils daily as needed for allergies. , Disp: , Rfl:  .  gabapentin (NEURONTIN) 300 MG capsule, TAKE 2 CAPSULES BY MOUTH FOUR TIMES DAILY (Patient taking differently: Take 300-600 mg by mouth See admin instructions. 300 mg twice daily, 600 mg at bedtime), Disp: 720 capsule, Rfl: 0 .  ipratropium (ATROVENT) 0.06 % nasal spray, Place 1 spray into both nostrils daily as needed for rhinitis., Disp: 15 mL, Rfl: 0 .  latanoprost (XALATAN) 0.005 % ophthalmic solution, Place 1 drop into both eyes at bedtime. , Disp: , Rfl:  .  levothyroxine (SYNTHROID) 112 MCG tablet, Take 112 mcg by mouth daily before breakfast., Disp: , Rfl:  .  losartan (COZAAR) 25 MG tablet, Take 25 mg by mouth daily. , Disp: , Rfl:  .  metoprolol tartrate (LOPRESSOR) 50 MG tablet, Take 0.5 tablets (25 mg total) by mouth 2 (two) times daily., Disp: , Rfl:  .  Multiple Vitamin  (MULTI-VITAMIN) tablet, Take 1 tablet by mouth at bedtime. , Disp: , Rfl:  .  Multiple Vitamins-Minerals (MULTIVITAMIN WITH MINERALS) tablet, Take by mouth., Disp: , Rfl:  .  pantoprazole (PROTONIX) 20 MG tablet, Take 1 tablet (20 mg total) by mouth daily., Disp: 90 tablet, Rfl: 1 .  Rivaroxaban (XARELTO) 15 MG TABS tablet, Take 1 tablet (15 mg total) by mouth daily with supper., Disp: 30 tablet, Rfl: 6 .  torsemide (DEMADEX) 20 MG tablet, Take 1 tablet (20 mg total) by mouth daily., Disp: 30 tablet, Rfl: 5 .  traMADol (ULTRAM) 50 MG tablet, Take 1 tablet (50 mg total) by mouth daily as needed (for pain.)., Disp: 30 tablet, Rfl: 0 .  ursodiol (ACTIGALL) 300 MG capsule, Take 300-600 mg by mouth See admin instructions. Take 2 capsules (600 mg) daily in the morning   & 1 capsule (300 mg) at night., Disp: ,  Rfl:   EXAM:  GENERAL: alert.  Sounds to be in no acute distress.  Answering questions appropriately.    PSYCH/NEURO: pleasant and cooperative, no obvious depression or anxiety, speech and thought processing grossly intact  ASSESSMENT AND PLAN:  Discussed the following assessment and plan:  Anemia Has a history of iron deficient anemia.  Seeing hematology.  Last hgb 13.    Atrial fibrillation ([place]) Followed by cardiology.  Stable.  On xarelto.    Autoimmune hepatitis Followed by GI.  Follow liver function tests.    Back pain Previously saw Dr Sharlet Salina.  Taking tramadol.  Discussed pain clinic.  She is interested in f/u with pain clinic for further evaluation and treatment.    Chronic diastolic heart failure Followed by cardiology.  On torsemide daily.  Stable.   CKD (chronic kidney disease), stage III (HCC) Follow metabolic panel. Previous abdominal ultrasound - no hydronephrosis or other acute abnormality.    Coronary artery disease Followed by cardiology.  Stable.    GERD (gastroesophageal reflux disease) Controlled.   Hypercholesterolemia Unable to take statin medication.   Low cholesterol diet and exercise.  Follow lipid panel.    Hypertension Blood pressure under good control.  Continue same medication regimen.  Follow pressures.  Follow metabolic panel.    Hypothyroidism On thyroid replacement.  Follow tsh.   Iron deficiency anemia Has followed by hematology.    Sinusitis Symptoms consistent with sinus infection.  Treat with atrovent nasal spray and saline nasal spray as outlined.  Mucinex/robitussin as directed.  Prescription for amoxicillin sent in.  Take if symptoms progress or do not resolve.     I discussed the assessment and treatment plan with the patient. The patient was provided an opportunity to ask questions and all were answered. The patient agreed with the plan and demonstrated an understanding of the instructions.   The patient was advised to call back or seek an in-person evaluation if the symptoms worsen or if the condition fails to improve as anticipated.  I provided 22 minutes of non-face-to-face time during this encounter.   Einar Pheasant, MD

## 2019-02-20 ENCOUNTER — Encounter: Payer: Self-pay | Admitting: Internal Medicine

## 2019-02-20 DIAGNOSIS — J329 Chronic sinusitis, unspecified: Secondary | ICD-10-CM | POA: Insufficient documentation

## 2019-02-20 NOTE — Assessment & Plan Note (Signed)
Followed by cardiology.  Stable.  On xarelto.

## 2019-02-20 NOTE — Assessment & Plan Note (Signed)
Blood pressure under good control.  Continue same medication regimen.  Follow pressures.  Follow metabolic panel.   

## 2019-02-20 NOTE — Assessment & Plan Note (Signed)
Unable to take statin medication.  Low cholesterol diet and exercise.  Follow lipid panel.  

## 2019-02-20 NOTE — Assessment & Plan Note (Signed)
Previously saw Dr Sharlet Salina.  Taking tramadol.  Discussed pain clinic.  She is interested in f/u with pain clinic for further evaluation and treatment.

## 2019-02-20 NOTE — Assessment & Plan Note (Signed)
Symptoms consistent with sinus infection.  Treat with atrovent nasal spray and saline nasal spray as outlined.  Mucinex/robitussin as directed.  Prescription for amoxicillin sent in.  Take if symptoms progress or do not resolve.

## 2019-02-20 NOTE — Assessment & Plan Note (Signed)
On thyroid replacement.  Follow tsh.  

## 2019-02-20 NOTE — Assessment & Plan Note (Signed)
Followed by GI.  Follow liver function tests.  

## 2019-02-20 NOTE — Assessment & Plan Note (Signed)
Has a history of iron deficient anemia.  Seeing hematology.  Last hgb 13.

## 2019-02-20 NOTE — Assessment & Plan Note (Signed)
Followed by cardiology. Stable.   

## 2019-02-20 NOTE — Assessment & Plan Note (Signed)
Has followed by hematology.

## 2019-02-20 NOTE — Assessment & Plan Note (Signed)
Follow metabolic panel. Previous abdominal ultrasound - no hydronephrosis or other acute abnormality.

## 2019-02-20 NOTE — Assessment & Plan Note (Signed)
Followed by cardiology.  On torsemide daily.  Stable.

## 2019-02-20 NOTE — Assessment & Plan Note (Signed)
Controlled.  

## 2019-02-23 ENCOUNTER — Other Ambulatory Visit: Payer: Self-pay | Admitting: Cardiovascular Disease

## 2019-02-26 ENCOUNTER — Other Ambulatory Visit: Payer: Self-pay | Admitting: Cardiovascular Disease

## 2019-03-03 ENCOUNTER — Other Ambulatory Visit: Payer: Self-pay

## 2019-03-03 ENCOUNTER — Ambulatory Visit (INDEPENDENT_AMBULATORY_CARE_PROVIDER_SITE_OTHER): Payer: Medicare Other

## 2019-03-03 DIAGNOSIS — I5032 Chronic diastolic (congestive) heart failure: Secondary | ICD-10-CM

## 2019-03-08 ENCOUNTER — Other Ambulatory Visit: Payer: Medicare Other

## 2019-03-08 ENCOUNTER — Ambulatory Visit: Payer: Medicare Other | Admitting: Oncology

## 2019-03-16 DIAGNOSIS — Z79899 Other long term (current) drug therapy: Secondary | ICD-10-CM | POA: Insufficient documentation

## 2019-03-16 DIAGNOSIS — M899 Disorder of bone, unspecified: Secondary | ICD-10-CM | POA: Insufficient documentation

## 2019-03-16 DIAGNOSIS — Z789 Other specified health status: Secondary | ICD-10-CM | POA: Insufficient documentation

## 2019-03-16 DIAGNOSIS — G894 Chronic pain syndrome: Secondary | ICD-10-CM | POA: Insufficient documentation

## 2019-03-16 NOTE — Progress Notes (Signed)
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 Fibrocystic breast disease, Glaucoma, Hypercholesterolemia, Hypertension, Hypothyroidism, Inflammatory arthritis, MI (myocardial infarction) ([place]), Neuropathy, Non-obstructive Coronary artery disease, Osteoarthritis, Permanent atrial fibrillation ([place]), PUD (peptic ulcer disease), and Severe mitral regurgitation. Family: family history includes Colon cancer in her daughter; Esophageal cancer in her brother; Ovarian cancer in her daughter; Stroke in her mother.  Past Surgical History:  Procedure Laterality Date  . ABDOMINAL HYSTERECTOMY  1963   partial, secondary to fibroids  . APPENDECTOMY    . Billroth    . CARDIAC  CATHETERIZATION  05/12/14   [facility]  . CARDIOVERSION  N/A 12/26/2016   Procedure: CARDIOVERSION;  Surgeon: [name], MD;  Location: [facility] ORS;  Service: Cardiovascular;  Laterality: N/A;  . CARDIOVERSION N/A 05/16/2017   Procedure: CARDIOVERSION;  Surgeon: [name], MD;  Location: [facility] ORS;  Service: Cardiovascular;  Laterality: N/A;  . CARDIOVERSION N/A 06/09/2017   Procedure: CARDIOVERSION;  Surgeon: [name], MD;  Location: [facility] ORS;  Service: Cardiovascular;  Laterality: N/A;  . CARDIOVERSION N/A 11/30/2018   Procedure: CARDIOVERSION;  Surgeon: [name], MD;  Location: [facility] ORS;  Service: Cardiovascular;  Laterality: N/A;  . CHOLECYSTECTOMY  1998  . COLONOSCOPY  2009  . RIGHT/LEFT HEART CATH AND CORONARY ANGIOGRAPHY Bilateral 08/21/2017   Procedure: RIGHT/LEFT HEART CATH AND CORONARY ANGIOGRAPHY;  Surgeon: [name], MD;  Location: [place] [facility];  Service: Cardiovascular;  Laterality: Bilateral;  . TEE WITHOUT CARDIOVERSION N/A 08/25/2017   Procedure: TRANSESOPHAGEAL ECHOCARDIOGRAM (TEE);  Surgeon: Jolaine Artist, MD;  Location: [facility] ENDOSCOPY;  Service: Cardiovascular;  Laterality: N/A;  . UPPER GI ENDOSCOPY  2004   Active Ambulatory Problems    Diagnosis Date Noted  . Hypothyroidism 04/30/2011  . Thyroiditis, lymphocytic 04/30/2011  . Benign hypertension 01/16/2012  . Hypercholesteremia 01/16/2012  . Autoimmune hepatitis ([place]) 01/20/2012  . Osteoporosis 01/20/2012  . GERD (gastroesophageal reflux disease) 10/18/2012  . Gastritis and gastroduodenitis 11/04/2012  . Palpitations 04/17/2014  . Health care maintenance 04/17/2014  . Renal insufficiency 05/06/2014  . Dyspnea 05/09/2014  . Coronary artery disease 11/12/2017  . Chronic diastolic heart failure ([place]) 05/24/2014  . Abnormal CXR 10/02/2014  . Back pain 05/19/2016  . Dizziness 08/09/2016  . Persistent atrial fibrillation ([place]) 11/12/2017  . Anemia 06/26/2013  . Acute on chronic  diastolic (congestive) heart failure ([place]) 05/24/2014  . Severe mitral regurgitation 11/12/2017  . Pulmonary hypertension, unspecified ([place]) 11/12/2017  . Acute on chronic diastolic CHF (congestive heart failure) ([place]) 08/21/2017  . Acute diastolic CHF (congestive heart failure) ([place]) 08/21/2017  . Itching 10/05/2017  . Iron deficiency anemia 02/25/2018  . Cough 05/24/2018  . Primary biliary cholangitis ([place]) 05/24/2018  . Stage 3 chronic kidney disease 07/26/2018  . Sinusitis 02/20/2019  . Benign meningioma ([place]) 09/20/2014  . Peripheral neuropathy 11/12/2017  . Onychomycosis due to dermatophyte 06/26/2013  . Hyperlipidemia 01/16/2012  . Exostosis 06/26/2013  . Chronic pain syndrome 03/16/2019  . Pharmacologic therapy 03/16/2019  . Disorder of skeletal system 03/16/2019  . Problems influencing health status 03/16/2019  . Chronic low back pain (Primary Area of Pain) (Bilateral) (L>R) w/o sciatica 03/17/2019  . Chronic hip pain (Secondary area of Pain) (Bilateral) (R>L) 03/17/2019  . Chronic lower extremity pain (Bilateral) 03/17/2019  . Chronic anticoagulation (Xarelto) 03/17/2019  . Lumbar spinal stenosis (L3-4, L4-5) w/ neurogenic claudication 03/17/2019  . Abnormal MRI, lumbar spine 03/17/2019  . Lumbar foraminal stenosis (L5-S1) (Bilateral) 03/17/2019  . Lumbar facet hypertrophy 03/17/2019  . Lumbar facet syndrome (Bilateral) 03/17/2019   Resolved Ambulatory Problems    Diagnosis Date Noted  . Acute URI 04/05/2015   Past Medical History:  Diagnosis Date  . (HFpEF) heart failure with preserved ejection fraction ([place])   . Fibrocystic breast disease   . Glaucoma   . Hypercholesterolemia   . Hypertension   . Inflammatory arthritis   . MI (myocardial infarction) ([place])   . Neuropathy   . Osteoarthritis   . Permanent atrial fibrillation ([place])   . PUD (peptic ulcer disease)    Assessment  Primary Diagnosis & Pertinent Problem List: The primary  encounter diagnosis was  Chronic low back pain (Primary Area of Pain) (Bilateral) (L>R) w/o sciatica. Diagnoses of Lumbar spinal stenosis (L3-4, L4-5) w/ neurogenic claudication, Lumbar foraminal stenosis (L5-S1) (Bilateral), Lumbar facet hypertrophy, Lumbar facet syndrome (Bilateral), Chronic hip pain (Secondary area of Pain) (Bilateral) (R>L), Chronic lower extremity pain (Bilateral), Abnormal MRI, lumbar spine, Chronic pain syndrome, Pharmacologic therapy, Disorder of skeletal system, Problems influencing health status, and Chronic anticoagulation (Xarelto) were also pertinent to this visit.  Visit Diagnosis (New problems to examiner): 1. Chronic low back pain (Primary Area of Pain) (Bilateral) (L>R) w/o sciatica   2. Lumbar spinal stenosis (L3-4, L4-5) w/ neurogenic claudication   3. Lumbar foraminal stenosis (L5-S1) (Bilateral)   4. Lumbar facet hypertrophy   5. Lumbar facet syndrome (Bilateral)   6. Chronic hip pain (Secondary area of Pain) (Bilateral) (R>L)   7. Chronic lower extremity pain (Bilateral)   8. Abnormal MRI, lumbar spine   9. Chronic pain syndrome   10. Pharmacologic therapy   11. Disorder of skeletal system   12. Problems influencing health status   13. Chronic anticoagulation (Xarelto)    Plan of Care (Initial workup plan)  Note: Ms. Eckersley was reminded that as per protocol, today's visit has been an evaluation only. We have not taken over the patient's controlled substance management.  Problem-specific plan: No problem-specific Assessment & Plan notes found for this encounter.   Lab Orders     UDS (Comprehensive-24) (Tox[facility]ure) ([facility]) (New Pt.)     Comprehensive Metabolic Panel w/GFR     Magnesium     Vitamin B12     Sedimentation rate     Vitamin D (D2+D3)     CRP  Imaging Orders     DG Lumbar Spine Complete w/ Flex/Ext (6 Views)     DG Hip (Right)     DG Hip (Left) Referral Orders  No referral(s) requested today   Procedure Orders    No procedure(s) ordered today    Pharmacotherapy (current): Medications ordered:  No orders of the defined types were placed in this encounter.  Medications administered during this visit: Marijah Larranaga. Tomkinson had no medications administered during this visit.   Pharmacological management options:  Opioid Analgesics: The patient was informed that there is no guarantee that she would be a candidate for opioid analgesics. The decision will be made following CDC guidelines. This decision will be based on the results of diagnostic studies, as well as Ms. Nass's risk profile.   Membrane stabilizer: To be determined at a later time  Muscle relaxant: To be determined at a later time  NSAID: To be determined at a later time  Other analgesic(s): To be determined at a later time   Interventional management options: Ruth Roth was informed that there is no guarantee that she would be a candidate for interventional therapies. The decision will be based on the results of diagnostic studies, as well as Ms. Boom's risk profile.  Procedure(s) under consideration:  NOTE: XARELTO ANTICOAGULATION (Stop: 3 days  Restart: 6 hrs)  Diagnostic left L3-4 LESI #1  Diagnostic left L4-5 LESI  Diagnostic bilateral L5-S1 TFESI  Diagnostic bilateral lumbar facet block  Possible bilateral lumbar facet RFA  Diagnostic bilateral intra-articular hip joint injection    Provider-requested follow-up: Return for (VV), (2V), (s/p Tests).  Future Appointments  Date Time Provider [place]  04/26/2019 12:00 PM O'Brien-Blaney, Bryson Corona, LPN [facility]  09/20/4578  1:20 PM [name], MD [facility]  06/02/2019 12:00 PM Bensimhon,  Shaune Pascal, MD [facility] None  06/29/2019 11:15 AM [facility]  07/01/2019  1:30 PM Einar Pheasant, MD [facility]  07/07/2019  1:00 PM [facility] None  07/09/2019  1:00 PM Earlie Server, MD [facility] None  07/09/2019  1:30 PM [facility] INFUSION CHAIR 1 [facility] None    Total  duration of non-face-to-face encounter: 30 minutes.  Primary Care Physician: Einar Pheasant, MD Location: [facility] Note by: Gaspar Cola, MD Date: 03/17/2019; Time: 11:33 AM  Note: This dictation was prepared with Dragon dictation. Any transcriptional errors that may result from this process are unintentional.

## 2019-03-17 ENCOUNTER — Ambulatory Visit: Payer: Medicare Other | Attending: Pain Medicine | Admitting: Pain Medicine

## 2019-03-17 ENCOUNTER — Other Ambulatory Visit: Payer: Self-pay

## 2019-03-17 ENCOUNTER — Telehealth: Payer: Self-pay | Admitting: Cardiovascular Disease

## 2019-03-17 VITALS — Ht 62.0 in | Wt 140.0 lb

## 2019-03-17 DIAGNOSIS — M79604 Pain in right leg: Secondary | ICD-10-CM | POA: Diagnosis not present

## 2019-03-17 DIAGNOSIS — Z79899 Other long term (current) drug therapy: Secondary | ICD-10-CM | POA: Diagnosis not present

## 2019-03-17 DIAGNOSIS — G894 Chronic pain syndrome: Secondary | ICD-10-CM | POA: Diagnosis not present

## 2019-03-17 DIAGNOSIS — Z789 Other specified health status: Secondary | ICD-10-CM | POA: Diagnosis not present

## 2019-03-17 DIAGNOSIS — G8929 Other chronic pain: Secondary | ICD-10-CM

## 2019-03-17 DIAGNOSIS — M899 Disorder of bone, unspecified: Secondary | ICD-10-CM

## 2019-03-17 DIAGNOSIS — M25551 Pain in right hip: Secondary | ICD-10-CM | POA: Diagnosis not present

## 2019-03-17 DIAGNOSIS — M47816 Spondylosis without myelopathy or radiculopathy, lumbar region: Secondary | ICD-10-CM | POA: Diagnosis not present

## 2019-03-17 DIAGNOSIS — M48062 Spinal stenosis, lumbar region with neurogenic claudication: Secondary | ICD-10-CM | POA: Diagnosis not present

## 2019-03-17 DIAGNOSIS — Z7901 Long term (current) use of anticoagulants: Secondary | ICD-10-CM | POA: Diagnosis not present

## 2019-03-17 DIAGNOSIS — M25552 Pain in left hip: Secondary | ICD-10-CM

## 2019-03-17 DIAGNOSIS — M48061 Spinal stenosis, lumbar region without neurogenic claudication: Secondary | ICD-10-CM | POA: Diagnosis not present

## 2019-03-17 DIAGNOSIS — R937 Abnormal findings on diagnostic imaging of other parts of musculoskeletal system: Secondary | ICD-10-CM

## 2019-03-17 DIAGNOSIS — M545 Low back pain, unspecified: Secondary | ICD-10-CM | POA: Insufficient documentation

## 2019-03-17 DIAGNOSIS — M79605 Pain in left leg: Secondary | ICD-10-CM

## 2019-03-17 NOTE — Telephone Encounter (Signed)
   [facility] Pre-operative Risk Assessment    Request for surgical clearance:  1. What type of surgery is being performed? Spinal procedure   2. When is this surgery scheduled? TBD  3. What type of clearance is required (medical clearance vs. Pharmacy clearance to hold med vs. Both)? both  4. Are there any medications that need to be held prior to surgery and how long? Xarelto stopped 3 days before procedure   5. Practice name and name of physician performing surgery? [facility] pain [facility], Dr Dossie Arbour   6. What is your office phone number [phone]   7.   What is your office fax number [phone]  8.   Anesthesia type (None, local, MAC, general) ? None listed    Ruth Roth 03/17/2019, 3:36 PM  _________________________________________________________________   (provider comments below)

## 2019-03-17 NOTE — Progress Notes (Signed)
Clearance faxed 03/17/19

## 2019-03-17 NOTE — Telephone Encounter (Signed)
Pt takes Xarelto for afib with CHADS2VASc score of 6 (age x2, sex, CHF, HTN, CAD). SCr 1.61, CrCl 36mL/min, pt appropriately on reduced dose of Xarelto 15mg  daily.  Recommend holding Xarelto for 3-4 days prior to spinal procedure due to reduced renal function.

## 2019-03-18 NOTE — Telephone Encounter (Signed)
   Primary Cardiologist: [name], MD  Primary Advance Heart Failure: .Glori Bickers, MD   Chart reviewed as part of pre-operative protocol coverage. Patient was contacted 03/18/2019 in reference to pre-operative risk assessment for pending surgery as outlined below.  Ruth Roth was last seen on 01/28/2019 by Dr. Haroldine Laws.  Since that day, Ruth Roth has done okay from a cardiac standpoint. She is quite limited in mobility by her back pain and unable to complete 4 METs. She has chronic DOE which is unchanged in recent months. Due to limited activity, will route to Dr. Ardyth Man. Fletcher Anon to weigh in on preop risk.  Per pharmacy recommendations, patient can hold xarelto 3-4 days prior to her upcoming spinal injection. Xarelto should be restarted when she is cleared to do so by Dr. Dossie Arbour.  Once I hear back from Dr. Haroldine Laws and/or Dr. Fletcher Anon, I will route this recommendation to the requesting party via [place] fax function and remove from pre-op pool.  Abigail Butts, PA-C 03/18/2019, 9:52 AM

## 2019-03-18 NOTE — Telephone Encounter (Signed)
Spinal injection is a low risk procedure and does not require further cardiac work-up.

## 2019-03-18 NOTE — Telephone Encounter (Signed)
   Primary Cardiologist: [name], MD  Chart reviewed as part of pre-operative protocol coverage. Per Dr. Fletcher Anon, spinal injection is low risk. Patient does not require further cardiac work-up prior to upcoming spinal injection.   I will route this recommendation to the requesting party via Epic fax function and remove from pre-op pool.  Please call with questions.  Abigail Butts, PA-C 03/18/2019, 1:39 PM

## 2019-03-19 NOTE — Telephone Encounter (Signed)
She is on Xarelto and would be high-risk for bleed with spinal injection. Would stop at least 3 days before.

## 2019-03-24 DIAGNOSIS — M810 Age-related osteoporosis without current pathological fracture: Secondary | ICD-10-CM | POA: Diagnosis not present

## 2019-04-04 ENCOUNTER — Other Ambulatory Visit: Payer: Self-pay | Admitting: Internal Medicine

## 2019-04-14 ENCOUNTER — Other Ambulatory Visit: Payer: Self-pay | Admitting: Internal Medicine

## 2019-04-15 ENCOUNTER — Other Ambulatory Visit: Payer: Self-pay

## 2019-04-15 ENCOUNTER — Telehealth: Payer: Self-pay | Admitting: Internal Medicine

## 2019-04-15 DIAGNOSIS — H401131 Primary open-angle glaucoma, bilateral, mild stage: Secondary | ICD-10-CM | POA: Diagnosis not present

## 2019-04-15 MED ORDER — IPRATROPIUM BROMIDE 0.06 % NA SOLN: 1.0000 | Freq: Every day | NASAL | 0 refills | Status: DC | PRN

## 2019-04-15 NOTE — Telephone Encounter (Signed)
Rx refilled.

## 2019-04-15 NOTE — Telephone Encounter (Signed)
Pt states she needs refill on ipratropium (ATROVENT) 0.06% nasal spray  [address] in [place]  Please advise

## 2019-04-26 ENCOUNTER — Ambulatory Visit: Payer: Medicare Other

## 2019-04-28 ENCOUNTER — Ambulatory Visit: Payer: Medicare Other

## 2019-04-30 ENCOUNTER — Other Ambulatory Visit: Payer: Self-pay

## 2019-04-30 ENCOUNTER — Encounter: Payer: Self-pay | Admitting: Cardiovascular Disease

## 2019-04-30 ENCOUNTER — Ambulatory Visit (INDEPENDENT_AMBULATORY_CARE_PROVIDER_SITE_OTHER): Payer: Medicare Other | Admitting: Cardiovascular Disease

## 2019-04-30 VITALS — BP 120/64 | HR 98 | Ht 63.0 in | Wt 141.5 lb

## 2019-04-30 DIAGNOSIS — I10 Essential (primary) hypertension: Secondary | ICD-10-CM | POA: Diagnosis not present

## 2019-04-30 DIAGNOSIS — I4821 Permanent atrial fibrillation: Secondary | ICD-10-CM | POA: Diagnosis not present

## 2019-04-30 DIAGNOSIS — I5032 Chronic diastolic (congestive) heart failure: Secondary | ICD-10-CM | POA: Diagnosis not present

## 2019-04-30 MED ORDER — METOPROLOL TARTRATE 75 MG PO TABS: 75.0000 mg | ORAL_TABLET | Freq: Two times a day (BID) | ORAL | 5 refills | Status: DC

## 2019-04-30 NOTE — Patient Instructions (Signed)
Medication Instructions:  Your physician has recommended you make the following change in your medication:   INCREASE Metoprolol to 75 mg twice daily. An Rx has been sent to your pharmacy.  *If you need a refill on your cardiac medications before your next appointment, please call your pharmacy*  Lab Work: None ordered If you have labs (blood work) drawn today and your tests are completely normal, you will receive your results only by: Marland Kitchen MyChart Message (if you have MyChart) OR . A paper copy in the mail If you have any lab test that is abnormal or we need to change your treatment, we will call you to review the results.  Testing/Procedures: None ordered  Follow-Up: At [facility], you and your health needs are our priority.  As part of our continuing mission to provide you with exceptional heart care, we have created designated Provider Care Teams.  These Care Teams include your primary Cardiologist (physician) and Advanced Practice Providers (APPs -  Physician Assistants and Nurse Practitioners) who all work together to provide you with the care you need, when you need it.  Your next appointment:   4 month(s)  The format for your next appointment:   In Person  Provider:    You may see [name], MD or one of the following Advanced Practice Providers on your designated Care Team:    Murray Hodgkins, NP  Christell Faith, PA-C  Marrianne Mood, PA-C   Other Instructions N/A

## 2019-04-30 NOTE — Progress Notes (Signed)
Cardiology Office Note   Date:  04/30/2019   ID:  Ruth Roth, DOB 12/10/1928, MRN [id number]  PCP:  Einar Pheasant, MD  Cardiologist:   [name], MD   Chief Complaint  Patient presents with  . other    3 month follow up. Meds reviewed by the pt. verbally. Pt. c/o shortness of breath with walking a long distance.       History of Present Illness: Ruth Roth is a 86 y.o. female who presents for a follow-up visit regarding coronary artery disease, chronic diastolic heart failure, mitral regurgitation and persistent atrial fibrillation .  She has known history of hypertension, peptic ulcer disease, fibrocystic breast disease, inflammatory arthritis, autoimmune hepatitis and neuropathy.  She had a small non-ST elevation myocardial infarction in March 2016 with acute diastolic heart failure after a GI illness.  Echocardiogram showed normal LV systolic function. Cardiac catheterization showed showed 60% ostial left circumflex hazy stenosis which was possibly the culprit. Mild LAD/RCA disease. She was treated medically.   She had worsening heart failure 2019.  Right and left cardiac catheterization in June 2019 showed  showed mild nonobstructive coronary artery disease.  Ejection fraction was 50 to 55% with severe mitral regurgitation.  Right heart catheterization showed severely elevated filling pressures with moderate to severe pulmonary hypertension and severely reduced cardiac output. I transferred the patient to [facility] where she was effectively diuresed with milrinone with subsequent improvement in symptoms.  She had a transesophageal echocardiogram done which showed normal LV systolic function with only mild mitral regurgitation.  The mitral regurgitation was felt to be functional with subsequent improvement after diuresis.  She had recurrent atrial fibrillation in spite of multiple cardioversions and using amiodarone.  Thus, I elected to continue with rate control she has been  doing reasonably well. She does complain of exertional palpitations that leads to shortness of breath.  No chest pain.  Her weight increased few pounds but she denies any orthopnea or leg edema.   Past Medical History:  Diagnosis Date  . (HFpEF) heart failure with preserved ejection fraction ([place])    a. 07/2017 Echo: EF 50-55%, no rwma, mild AS/AI, mod MR, mod dil LA, mod TR, PASP 55mmHg.  . Autoimmune hepatitis ([place])    followed by Dr [name]  . Fibrocystic breast disease   . Glaucoma   . Hypercholesterolemia   . Hypertension   . Hypothyroidism   . Inflammatory arthritis   . MI (myocardial infarction) ([place])   . Neuropathy   . Non-obstructive Coronary artery disease    a. 05/2014 NSTEMI/Cath: LM nl, LAD mild dzs, LCX 60ost hazy (? culprit), RCA mild dzs. EF 60% by echo-->Med Rx; b. 08/2017 Cath: LM 30ost, LAD min irregs, LCX small, 40ost/p, RCA large, min irregs, RPDA/RPL1 nl, EF 50-55%. 4+MR.  . Osteoarthritis   . Permanent atrial fibrillation (H[facility])    a. CHA2DS2VASc = 6-->Xarelto.  . 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 Severe mitral regurgitation    a. 07/2017 Echo: Mod MR; b. 08/2017 Cath: 4+/Severe MR.    Past Surgical History:  Procedure Laterality Date  . ABDOMINAL HYSTERECTOMY  1963   partial, secondary to fibroids  . APPENDECTOMY    . Billroth    . CARDIAC CATHETERIZATION  05/12/14   [facility]  . CARDIOVERSION N/A 12/26/2016   Procedure: CARDIOVERSION;  Surgeon: [name], MD;  Location: [facility] ORS;  Service: Cardiovascular;  Laterality: N/A;  .  CARDIOVERSION N/A 05/16/2017   Procedure: CARDIOVERSION;  Surgeon: [name], MD;  Location: [facility] ORS;  Service: Cardiovascular;  Laterality: N/A;  . CARDIOVERSION N/A 06/09/2017   Procedure: CARDIOVERSION;  Surgeon: [name], MD;  Location: [facility] ORS;  Service: Cardiovascular;  Laterality: N/A;  . CARDIOVERSION N/A 11/30/2018   Procedure: CARDIOVERSION;   Surgeon: [name], MD;  Location: [facility] ORS;  Service: Cardiovascular;  Laterality: N/A;  . CHOLECYSTECTOMY  1998  . COLONOSCOPY  2009  . RIGHT/LEFT HEART CATH AND CORONARY ANGIOGRAPHY Bilateral 08/21/2017   Procedure: RIGHT/LEFT HEART CATH AND CORONARY ANGIOGRAPHY;  Surgeon: [name], MD;  Location: [place] [facility] LAB;  Service: Cardiovascular;  Laterality: Bilateral;  . TEE WITHOUT CARDIOVERSION N/A 08/25/2017   Procedure: TRANSESOPHAGEAL ECHOCARDIOGRAM (TEE);  Surgeon: Jolaine Artist, MD;  Location: [facility] ENDOSCOPY;  Service: Cardiovascular;  Laterality: N/A;  . UPPER GI ENDOSCOPY  2004     Current Outpatient Medications  Medication Sig Dispense Refill  . brimonidine-timolol (COMBIGAN) 0.2-0.5 % ophthalmic solution Place 1 drop into the left eye 2 (two) times daily.     . calcitRIOL (ROCALTROL) 0.25 MCG capsule Take 0.25 mcg by mouth daily.     . dorzolamide (TRUSOPT) 2 % ophthalmic solution Place 1 drop into the left eye 2 (two) times daily.    . fluticasone (FLONASE) 50 MCG/ACT nasal spray Place 2 sprays into both nostrils daily as needed for allergies.     Marland Kitchen gabapentin (NEURONTIN) 300 MG capsule TAKE 2 CAPSULES BY MOUTH FOUR TIMES DAILY (Patient taking differently: Take 300-600 mg by mouth See admin instructions. 300 mg twice daily, 600 mg at bedtime) 720 capsule 0  . ipratropium (ATROVENT) 0.06 % nasal spray Place 1 spray into both nostrils daily as needed for rhinitis. 15 mL 0  . latanoprost (XALATAN) 0.005 % ophthalmic solution Place 1 drop into both eyes at bedtime.     Marland Kitchen levothyroxine (SYNTHROID) 112 MCG tablet Take 112 mcg by mouth daily before breakfast.    . losartan (COZAAR) 25 MG tablet Take 25 mg by mouth daily.     . metoprolol tartrate (LOPRESSOR) 50 MG tablet TAKE 1 TABLET BY MOUTH TWICE DAILY 180 tablet 0  . Multiple Vitamin (MULTI-VITAMIN) tablet Take 1 tablet by mouth at bedtime.     . pantoprazole (PROTONIX) 20 MG tablet TAKE 1 TABLET BY MOUTH EVERY  DAY 90 tablet 1  . Rivaroxaban (XARELTO) 15 MG TABS tablet Take 1 tablet (15 mg total) by mouth daily with supper. 30 tablet 6  . torsemide (DEMADEX) 20 MG tablet Take 1 tablet (20 mg total) by mouth daily. 30 tablet 5  . traMADol (ULTRAM) 50 MG tablet Take 1 tablet (50 mg total) by mouth daily as needed (for pain.). 30 tablet 0  . ursodiol (ACTIGALL) 300 MG capsule Take 300-600 mg by mouth See admin instructions. Take 2 capsules (600 mg) daily in the morning   & 1 capsule (300 mg) at night.     No current facility-administered medications for this visit.    Allergies:   Statins, Haldol [haloperidol lactate], Librax [chlordiazepoxide-clidinium], Plavix [clopidogrel bisulfate], and Ramipril    Social History:  The patient  reports that she has never smoked. She has never used smokeless tobacco. She reports that she does not drink alcohol or use drugs.   Family History:  The patient's family history includes Colon cancer in her daughter; Esophageal cancer in her brother; Ovarian cancer in her daughter; Stroke in her mother.  ROS:  Please see the history of present illness.   Otherwise, review of systems are positive for none.   All other systems are reviewed and negative.    PHYSICAL EXAM: VS:  BP 120/64 (BP Location: Left Arm, Patient Position: Sitting, Cuff Size: Normal)   Pulse 98   Ht 5\' 3"  (1.6 m)   Wt 141 lb 8 oz (64.2 kg)   SpO2 98%   BMI 25.07 kg/m  , BMI Body mass index is 25.07 kg/m. GEN: Well nourished, well developed, in no acute distress  HEENT: normal  Neck: No JVD, carotid bruits, or masses Cardiac: Irregularly irregular ; no rubs, or gallops,no edema . 1/ 6 systolic ejection murmur at the aortic area.  Mild bilateral leg edema Respiratory: Clear to auscultation GI: soft, nontender, nondistended, + BS MS: no deformity or atrophy  Skin: warm and dry, no rash Neuro:  Strength and sensation are intact Psych: euthymic mood, full affect   EKG:  EKG is ordered today.  The ekg ordered today demonstrates atrial fibrillation with ventricular rate of 98 bpm.  No voltage.  No significant ST or T wave changes.   Recent Labs: 01/07/2019: Hemoglobin 13.0; Platelets 179 01/28/2019: B Natriuretic Peptide 359.0 02/12/2019: ALT 16; BUN 44; Creatinine, Ser 1.61; Potassium 4.4; Sodium 140; TSH 2.00    Lipid Panel    Component Value Date/Time   CHOL 195 02/12/2019 1139   TRIG 153.0 (H) 02/12/2019 1139   HDL 57.20 02/12/2019 1139   CHOLHDL 3 02/12/2019 1139   VLDL 30.6 02/12/2019 1139   LDLCALC 107 (H) 02/12/2019 1139   LDLDIRECT 143.5 12/17/2012 0955      Wt Readings from Last 3 Encounters:  04/30/19 141 lb 8 oz (64.2 kg)  03/16/19 140 lb (63.5 kg)  02/16/19 134 lb (60.8 kg)       ASSESSMENT AND PLAN:  1.    Permanent atrial fibrillation: Ventricular rate does not seem to be optimally controlled.  Thus, I elected to increase metoprolol to 75 mg twice daily.  Continue anticoagulation with Xarelto 15 mg daily given underlying chronic kidney disease.    2. Chronic diastolic heart failure:  In spite of some weight gain, she appears to be euvolemic clinically.  Continue current dose of torsemide. She has been following with Dr. Holley Raring for chronic kidney disease.    3. Essential hypertension: Blood pressure is controlled on current medications.  4.  Functional mitral regurgitation: Severe MR was only in the setting of severe volume overload.  MR was only mild on most recent transesophageal echo.   Disposition:   FU with me in 4 months.  Signed,  [name], MD  04/30/2019 1:14 PM    [facility]

## 2019-05-04 ENCOUNTER — Other Ambulatory Visit: Payer: Self-pay | Admitting: Internal Medicine

## 2019-05-08 ENCOUNTER — Other Ambulatory Visit: Payer: Self-pay | Admitting: Internal Medicine

## 2019-05-10 DIAGNOSIS — D631 Anemia in chronic kidney disease: Secondary | ICD-10-CM | POA: Diagnosis not present

## 2019-05-10 DIAGNOSIS — I10 Essential (primary) hypertension: Secondary | ICD-10-CM | POA: Diagnosis not present

## 2019-05-10 DIAGNOSIS — N2581 Secondary hyperparathyroidism of renal origin: Secondary | ICD-10-CM | POA: Diagnosis not present

## 2019-05-10 DIAGNOSIS — N1832 Chronic kidney disease, stage 3b: Secondary | ICD-10-CM | POA: Diagnosis not present

## 2019-05-11 ENCOUNTER — Other Ambulatory Visit: Payer: Self-pay | Admitting: General Surgery

## 2019-05-11 DIAGNOSIS — L723 Sebaceous cyst: Secondary | ICD-10-CM | POA: Diagnosis not present

## 2019-05-12 ENCOUNTER — Other Ambulatory Visit: Payer: Self-pay

## 2019-05-12 MED ORDER — METOPROLOL TARTRATE 50 MG PO TABS: 75.0000 mg | ORAL_TABLET | Freq: Two times a day (BID) | ORAL | 5 refills | Status: DC

## 2019-05-13 ENCOUNTER — Ambulatory Visit (INDEPENDENT_AMBULATORY_CARE_PROVIDER_SITE_OTHER): Payer: Medicare Other

## 2019-05-13 ENCOUNTER — Other Ambulatory Visit: Payer: Self-pay

## 2019-05-13 VITALS — Ht 63.0 in | Wt 141.0 lb

## 2019-05-13 DIAGNOSIS — Z0000 Encounter for general adult medical examination without abnormal findings: Secondary | ICD-10-CM | POA: Diagnosis not present

## 2019-05-13 LAB — SURGICAL PATHOLOGY

## 2019-05-13 NOTE — Patient Instructions (Addendum)
  Ms. Woodburn , Thank you for taking time to come for your Medicare Wellness Visit. I appreciate your ongoing commitment to your health goals. Please review the following plan we discussed and let me know if I can assist you in the future.   These are the goals we discussed: Goals      Patient Stated   . Increase physical activity (pt-stated)       This is a list of the screening recommended for you and due dates:  Health Maintenance  Topic Date Due  . Mammogram  09/14/2019  . Tetanus Vaccine  08/16/2020  . Flu Shot  Completed  . DEXA scan (bone density measurement)  Completed  . Pneumonia vaccines  Completed

## 2019-05-13 NOTE — Progress Notes (Addendum)
Subjective:   Ruth Roth is a 86 y.o. female who presents for Medicare Annual (Subsequent) preventive examination.  Review of Systems:  No ROS.  Medicare Wellness Virtual Visit.  Visual/audio telehealth visit, UTA vital signs.   Ht/Wt provided.  See social history for additional risk factors.   Cardiac Risk Factors include: advanced age (>70men, >24 women);hypertension     Objective:     Vitals: Ht 5\' 3"  (1.6 m)   Wt 141 lb (64 kg)   BMI 24.98 kg/m   Body mass index is 24.98 kg/m.  Advanced Directives 05/13/2019 03/16/2019 01/08/2019 09/04/2018 05/05/2018 04/23/2018 02/17/2018  Does Patient Have a Medical Advance Directive? Yes Yes Yes Yes Yes Yes Yes  Type of Academic librarian Living will;Healthcare Power of [place];Living will [place];Living will [place];Living will  Does patient want to make changes to medical advance directive? No - Patient declined - - - - No - Patient declined -  Copy of [place] in Chart? No - copy requested - - No - copy requested No - copy requested No - copy requested -  Would patient like information on creating a medical advance directive? - - - - - - -    Tobacco Social History   Tobacco Use  Smoking Status Never Smoker  Smokeless Tobacco Never Used     Counseling given: Not Answered   Clinical Intake:  Pre-visit preparation completed: Yes        Diabetes: No  How often do you need to have someone help you when you read instructions, pamphlets, or other written materials from your doctor or pharmacy?: 1 - Never  Interpreter Needed?: No     Past Medical History:  Diagnosis Date  . (HFpEF) heart failure with preserved ejection fraction ([place])    a. 07/2017 Echo: EF 50-55%, no rwma, mild AS/AI, mod MR, mod dil LA, mod TR, PASP 74mmHg.  . Autoimmune hepatitis ([place])    followed by Dr [name]  . Fibrocystic breast disease   . Glaucoma   . Hypercholesterolemia   . Hypertension   . Hypothyroidism   . Inflammatory arthritis   . MI (myocardial infarction) ([place])   . Neuropathy   . Non-obstructive Coronary artery disease    a. 05/2014 NSTEMI/Cath: LM nl, LAD mild dzs, LCX 60ost hazy (? culprit), RCA mild dzs. EF 60% by echo-->Med Rx; b. 08/2017 Cath: LM 30ost, LAD min irregs, LCX small, 40ost/p, RCA large, min irregs, RPDA/RPL1 nl, EF 50-55%. 4+MR.  . Osteoarthritis   . Permanent atrial fibrillation (HCC)    a. CHA2DS2VASc = 6-->Xarelto.  . 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 Severe mitral regurgitation    a. 07/2017 Echo: Mod MR; b. 08/2017 Cath: 4+/Severe MR.   Past Surgical History:  Procedure Laterality Date  . ABDOMINAL HYSTERECTOMY  1963   partial, secondary to fibroids  . APPENDECTOMY    . Billroth    . CARDIAC CATHETERIZATION  05/12/14   [facility]  . CARDIOVERSION N/A 12/26/2016   Procedure: CARDIOVERSION;  Surgeon: [name], MD;  Location: [facility] ORS;  Service: Cardiovascular;  Laterality: N/A;  . CARDIOVERSION N/A 05/16/2017   Procedure: CARDIOVERSION;  Surgeon: [name], MD;  Location: [facility] ORS;  Service: Cardiovascular;  Laterality: N/A;  . CARDIOVERSION N/A 06/09/2017   Procedure: CARDIOVERSION;  Surgeon: Fletcher Anon,  Mertie Clause, MD;  Location: [facility] ORS;  Service: Cardiovascular;  Laterality: N/A;  . CARDIOVERSION N/A 11/30/2018   Procedure: CARDIOVERSION;  Surgeon: [name], MD;  Location: [facility] ORS;  Service: Cardiovascular;  Laterality: N/A;  . CHOLECYSTECTOMY  1998  . COLONOSCOPY  2009  . RIGHT/LEFT HEART CATH AND CORONARY ANGIOGRAPHY Bilateral 08/21/2017   Procedure: RIGHT/LEFT HEART CATH AND CORONARY ANGIOGRAPHY;  Surgeon: [name], MD;  Location: [facility];  Service: Cardiovascular;  Laterality: Bilateral;  . TEE WITHOUT CARDIOVERSION N/A 08/25/2017     Procedure: TRANSESOPHAGEAL ECHOCARDIOGRAM (TEE);  Surgeon: Jolaine Artist, MD;  Location: [facility] ENDOSCOPY;  Service: Cardiovascular;  Laterality: N/A;  . UPPER GI ENDOSCOPY  2004   Family History  Problem Relation Age of Onset  . Stroke Mother   . Esophageal cancer Brother        also had lung cancer  . Ovarian cancer Daughter   . Colon cancer Daughter   . Breast cancer Neg Hx    Social History   Socioeconomic History  . Marital status: Widowed    Spouse name: Not on file  . Number of children: 2  . Years of education: Not on file  . Highest education level: Not on file  Occupational History  . Occupation: retired  Tobacco Use  . Smoking status: Never Smoker  . Smokeless tobacco: Never Used  Substance and Sexual Activity  . Alcohol use: No    Alcohol/week: 0.0 standard drinks  . Drug use: No  . Sexual activity: Never  Other Topics Concern  . Not on file  Social History Narrative  . Not on file   Social Determinants of Health   Financial Resource Strain:   . Difficulty of Paying Living Expenses: Not on file  Food Insecurity:   . Worried About Charity fundraiser in the Last Year: Not on file  . Ran Out of Food in the Last Year: Not on file  Transportation Needs:   . Lack of Transportation (Medical): Not on file  . Lack of Transportation (Non-Medical): Not on file  Physical Activity:   . Days of Exercise per Week: Not on file  . Minutes of Exercise per Session: Not on file  Stress:   . Feeling of Stress : Not on file  Social Connections: Unknown  . Frequency of Communication with Friends and Family: More than three times a week  . Frequency of Social Gatherings with Friends and Family: Not on file  . Attends Religious Services: Not on file  . Active Member of Clubs or Organizations: Not on file  . Attends Archivist Meetings: Not on file  . Marital Status: Not on file    Outpatient Encounter Medications as of 05/13/2019  Medication Sig  .  brimonidine-timolol (COMBIGAN) 0.2-0.5 % ophthalmic solution Place 1 drop into the left eye 2 (two) times daily.   . calcitRIOL (ROCALTROL) 0.25 MCG capsule Take 0.25 mcg by mouth daily.   . dorzolamide (TRUSOPT) 2 % ophthalmic solution Place 1 drop into the left eye 2 (two) times daily.  . fluticasone (FLONASE) 50 MCG/ACT nasal spray Place 2 sprays into both nostrils daily as needed for allergies.   Marland Kitchen gabapentin (NEURONTIN) 300 MG capsule TAKE 2 CAPSULES BY MOUTH FOUR TIMES DAILY (Patient taking differently: Take 300-600 mg by mouth See admin instructions. 300 mg twice daily, 600 mg at bedtime)  . ipratropium (ATROVENT) 0.06 % nasal spray PLACE 1 SPRAY INTO BOTH NOSTRILS DAILY AS NEEDED  FOR RHINITIS.  Marland Kitchen latanoprost (XALATAN) 0.005 % ophthalmic solution Place 1 drop into both eyes at bedtime.   Marland Kitchen levothyroxine (SYNTHROID) 112 MCG tablet Take 112 mcg by mouth daily before breakfast.  . losartan (COZAAR) 25 MG tablet Take 25 mg by mouth daily.   . metoprolol tartrate (LOPRESSOR) 50 MG tablet Take 1.5 tablets (75 mg total) by mouth 2 (two) times daily.  . Multiple Vitamin (MULTI-VITAMIN) tablet Take 1 tablet by mouth at bedtime.   . pantoprazole (PROTONIX) 20 MG tablet TAKE 1 TABLET BY MOUTH EVERY DAY  . Rivaroxaban (XARELTO) 15 MG TABS tablet Take 1 tablet (15 mg total) by mouth daily with supper.  . torsemide (DEMADEX) 20 MG tablet Take 1 tablet (20 mg total) by mouth daily.  . traMADol (ULTRAM) 50 MG tablet Take 1 tablet (50 mg total) by mouth daily as needed (for pain.).  Marland Kitchen ursodiol (ACTIGALL) 300 MG capsule Take 300-600 mg by mouth See admin instructions. Take 2 capsules (600 mg) daily in the morning   & 1 capsule (300 mg) at night.   No facility-administered encounter medications on file as of 05/13/2019.    Activities of Daily Living In your present state of health, do you have any difficulty performing the following activities: 05/13/2019 11/30/2018  Hearing? N N  Vision? N N  Difficulty  concentrating or making decisions? N N  Walking or climbing stairs? Y N  Comment Difficulty with stairs, paces herself. -  Dressing or bathing? N N  Doing errands, shopping? N -  Preparing Food and eating ? N -  Using the Toilet? N -  In the past six months, have you accidently leaked urine? N -  Do you have problems with loss of bowel control? N -  Managing your Medications? N -  Managing your Finances? N -  Housekeeping or managing your Housekeeping? N -  Some recent data might be hidden    Patient Care Team: Einar Pheasant, MD as PCP - General (Unknown Physician Specialty) [name], MD as PCP - Cardiology (Cardiology) Bensimhon, Shaune Pascal, MD as PCP - Advanced Heart Failure (Cardiology) Bary Castilla Forest Gleason, MD (General Surgery)    Assessment:   This is a routine wellness examination for Aneta.  Nurse connected with patient 05/13/19 at 11:30 AM EST by a telephone enabled telemedicine application and verified that I am speaking with the correct person using two identifiers. Patient stated full name and DOB. Patient gave permission to continue with virtual visit. Patient's location was at home and Nurse's location was at [place] office.   Patient is alert and oriented x3. Patient denies difficulty focusing or concentrating. Patient likes to read and complete crossword puzzles for brain stimulation.   Health Maintenance Due: See completed HM at the end of note.   Eye: Visual acuity not assessed. Virtual visit. Followed by their ophthalmologist.  Dental: Visits every 6 months.    Hearing: Demonstrates normal hearing during visit.  Safety:  Patient feels safe at home- yes Patient does have smoke detectors at home- yes Patient does wear sunscreen or protective clothing when in direct sunlight - yes Patient does wear seat belt when in a moving vehicle - yes Patient drives- yes Adequate lighting in walkways free from debris- yes Grab bars and handrails used as  appropriate- yes Ambulates with an assistive device- no Cell phone on person when ambulating outside of the home- yes  Social: Alcohol intake - no   Smoking history- never   Smokers in  home? none Illicit drug use? none  Medication: Taking as directed and without issues.  Pill box in use -yes  Self managed - yes   Covid-19: Precautions and sickness symptoms discussed. Wears mask, social distancing, hand hygiene as appropriate.   Activities of Daily Living Patient denies needing assistance with: household chores, feeding themselves, getting from bed to chair, getting to the toilet, bathing/showering, dressing, managing money, or preparing meals.   Discussed the importance of a healthy diet, water intake and the benefits of aerobic exercise.   Physical activity- active in the home, no routine. Plans to return to heart track gym later in the season.   Diet:  Regular Water: good intake  Other Providers Patient Care Team: Einar Pheasant, MD as PCP - General (Unknown Physician Specialty) [name], MD as PCP - Cardiology (Cardiology) Bensimhon, Shaune Pascal, MD as PCP - Advanced Heart Failure (Cardiology) Bary Castilla Forest Gleason, MD (General Surgery)  Exercise Activities and Dietary recommendations Current Exercise Habits: Home exercise routine, Intensity: Mild  Goals      Patient Stated   . Increase physical activity (pt-stated)       Fall Risk Fall Risk  05/13/2019 04/23/2018 02/16/2018 11/25/2017 04/21/2017  Falls in the past year? 0 0 1 Yes No  Number falls in past yr: - - 1 1 -  Injury with Fall? - - 1 Yes -  Risk Factor Category  - - High Risk (2 or more Points) High Fall Risk -  Risk for fall due to : - - Impaired balance/gait Impaired balance/gait;Other (Comment) -  Risk for fall due to: Comment - - - patient states she hit her head recently from being too dizzy -  Follow up Falls evaluation completed - Falls evaluation completed;Education provided;Falls prevention  discussed;Follow up appointment Falls evaluation completed;Education provided;Falls prevention discussed;Follow up appointment -   Timed Get Up and Go performed: no, virtual visit  Depression Screen PHQ 2/9 Scores 05/13/2019 05/22/2018 04/23/2018 03/27/2018  PHQ - 2 Score 0 0 0 0  PHQ- 9 Score - 0 - 2     Cognitive Function MMSE - Mini Mental State Exam 04/19/2015  Orientation to time 5  Orientation to Place 5  Registration 3  Attention/ Calculation 5  Recall 3  Language- name 2 objects 2  Language- repeat 1  Language- follow 3 step command 3  Language- read & follow direction 1  Write a sentence 1  Copy design 1  Total score 30     6CIT Screen 05/13/2019 04/23/2018 04/21/2017 04/18/2016  What Year? 0 points 0 points 0 points 0 points  What month? 0 points 0 points 0 points 0 points  What time? 0 points 0 points 0 points 0 points  Count back from 20 0 points 0 points 0 points 0 points  Months in reverse 0 points 0 points 0 points 0 points  Repeat phrase 0 points 0 points 0 points 0 points  Total Score 0 0 0 0    Immunization History  Administered Date(s) Administered  . Fluad Quad(high Dose 65+) 02/12/2019  . Influenza, High Dose Seasonal PF 01/23/2016, 01/21/2017, 01/30/2018  . Influenza,inj,Quad PF,6+ Mos 12/24/2012, 01/11/2014, 01/16/2015  . Influenza-Unspecified 12/10/2011  . Moderna SARS-COVID-2 Vaccination 03/23/2019, 04/24/2019  . Pneumococcal Conjugate-13 02/22/2014  . Pneumococcal Polysaccharide-23 07/30/2016  . Td 08/17/2010   Screening Tests Health Maintenance  Topic Date Due  . MAMMOGRAM  09/14/2019  . TETANUS/TDAP  08/16/2020  . INFLUENZA VACCINE  Completed  . DEXA SCAN  Completed  . PNA vac Low Risk Adult  Completed       Plan:   Keep all routine maintenance appointments.   Next scheduled fasting lab 07/08/19 @ 1030  Follow up 07/13/19 @ 2:00  Medicare Attestation I have personally reviewed: The patient's medical and social history Their use of  alcohol, tobacco or illicit drugs Their current medications and supplements The patient's functional ability including ADLs,fall risks, home safety risks, cognitive, and hearing and visual impairment Diet and physical activities Evidence for depression   I have reviewed and discussed with patient certain preventive protocols, quality metrics, and best practice recommendations.   Varney Biles, LPN  04/15/9100   Reviewed above information.  Agree with assessment and plan.    Dr Nicki Reaper

## 2019-05-25 DIAGNOSIS — J300 Vasomotor rhinitis: Secondary | ICD-10-CM | POA: Diagnosis not present

## 2019-05-25 DIAGNOSIS — J340 Abscess, furuncle and carbuncle of nose: Secondary | ICD-10-CM | POA: Diagnosis not present

## 2019-05-25 DIAGNOSIS — J0100 Acute maxillary sinusitis, unspecified: Secondary | ICD-10-CM | POA: Diagnosis not present

## 2019-05-31 ENCOUNTER — Other Ambulatory Visit: Payer: Self-pay | Admitting: Cardiovascular Disease

## 2019-05-31 NOTE — Telephone Encounter (Signed)
Refill request

## 2019-05-31 NOTE — Telephone Encounter (Signed)
Pt's age 86, wt 64 kg, SCr 1.61, CrCl 23.93, last ov w/ MA 04/30/19.

## 2019-06-02 ENCOUNTER — Other Ambulatory Visit: Payer: Self-pay

## 2019-06-02 ENCOUNTER — Encounter (HOSPITAL_COMMUNITY): Payer: Self-pay | Admitting: Internal Medicine

## 2019-06-02 ENCOUNTER — Ambulatory Visit (HOSPITAL_COMMUNITY)
Admission: RE | Admit: 2019-06-02 | Discharge: 2019-06-02 | Disposition: A | Discharge status: Home / Self Care | Payer: Medicare Other | Source: Ambulatory Visit | Attending: Internal Medicine | Admitting: Internal Medicine

## 2019-06-02 VITALS — BP 134/70 | HR 84 | Wt 142.4 lb

## 2019-06-02 DIAGNOSIS — Z79890 Hormone replacement therapy (postmenopausal): Secondary | ICD-10-CM | POA: Diagnosis not present

## 2019-06-02 DIAGNOSIS — G629 Polyneuropathy, unspecified: Secondary | ICD-10-CM | POA: Diagnosis not present

## 2019-06-02 DIAGNOSIS — Z888 Allergy status to other drugs, medicaments and biological substances status: Secondary | ICD-10-CM | POA: Diagnosis not present

## 2019-06-02 DIAGNOSIS — Z79899 Other long term (current) drug therapy: Secondary | ICD-10-CM | POA: Diagnosis not present

## 2019-06-02 DIAGNOSIS — N1832 Chronic kidney disease, stage 3b: Secondary | ICD-10-CM

## 2019-06-02 DIAGNOSIS — I2510 Atherosclerotic heart disease of native coronary artery without angina pectoris: Secondary | ICD-10-CM | POA: Diagnosis not present

## 2019-06-02 DIAGNOSIS — I130 Hypertensive heart and chronic kidney disease with heart failure and stage 1 through stage 4 chronic kidney disease, or unspecified chronic kidney disease: Secondary | ICD-10-CM | POA: Insufficient documentation

## 2019-06-02 DIAGNOSIS — I252 Old myocardial infarction: Secondary | ICD-10-CM | POA: Insufficient documentation

## 2019-06-02 DIAGNOSIS — H409 Unspecified glaucoma: Secondary | ICD-10-CM | POA: Insufficient documentation

## 2019-06-02 DIAGNOSIS — I5032 Chronic diastolic (congestive) heart failure: Secondary | ICD-10-CM | POA: Diagnosis not present

## 2019-06-02 DIAGNOSIS — Z7901 Long term (current) use of anticoagulants: Secondary | ICD-10-CM | POA: Insufficient documentation

## 2019-06-02 DIAGNOSIS — I4821 Permanent atrial fibrillation: Secondary | ICD-10-CM

## 2019-06-02 DIAGNOSIS — I340 Nonrheumatic mitral (valve) insufficiency: Secondary | ICD-10-CM | POA: Diagnosis not present

## 2019-06-02 DIAGNOSIS — N183 Chronic kidney disease, stage 3 unspecified: Secondary | ICD-10-CM | POA: Diagnosis not present

## 2019-06-02 DIAGNOSIS — I482 Chronic atrial fibrillation, unspecified: Secondary | ICD-10-CM | POA: Insufficient documentation

## 2019-06-02 DIAGNOSIS — K754 Autoimmune hepatitis: Secondary | ICD-10-CM | POA: Diagnosis not present

## 2019-06-02 DIAGNOSIS — E039 Hypothyroidism, unspecified: Secondary | ICD-10-CM | POA: Insufficient documentation

## 2019-06-02 DIAGNOSIS — Z8711 Personal history of peptic ulcer disease: Secondary | ICD-10-CM | POA: Insufficient documentation

## 2019-06-02 LAB — COMPREHENSIVE METABOLIC PANEL
ALT: 41 U/L (ref 0–44)
AST: 14 U/L — ABNORMAL LOW (ref 15–41)
Albumin: 3.9 g/dL (ref 3.5–5.0)
Alkaline Phosphatase: 88 U/L (ref 38–126)
Anion gap: 12 (ref 5–15)
BUN: 48 mg/dL — ABNORMAL HIGH (ref 8–23)
CO2: 20 mmol/L — ABNORMAL LOW (ref 22–32)
Calcium: 8.4 mg/dL — ABNORMAL LOW (ref 8.9–10.3)
Chloride: 107 mmol/L (ref 98–111)
Creatinine, Ser: 1.58 mg/dL — ABNORMAL HIGH (ref 0.44–1.00)
GFR calc Af Amer: 33 mL/min — ABNORMAL LOW (ref >60)
GFR calc non Af Amer: 29 mL/min — ABNORMAL LOW (ref >60)
Glucose, Bld: 85 mg/dL (ref 70–99)
Potassium: 3.7 mmol/L (ref 3.5–5.1)
Sodium: 139 mmol/L (ref 135–145)
Total Bilirubin: 0.6 mg/dL (ref 0.3–1.2)
Total Protein: 6.5 g/dL (ref 6.5–8.1)

## 2019-06-02 LAB — CBC
HCT: 43.6 % (ref 36.0–46.0)
Hemoglobin: 13.3 g/dL (ref 12.0–15.0)
MCH: 31.6 pg (ref 26.0–34.0)
MCHC: 30.5 g/dL (ref 30.0–36.0)
MCV: 103.6 fL — ABNORMAL HIGH (ref 80.0–100.0)
Platelets: 177 10*3/uL (ref 150–400)
RBC: 4.21 MIL/uL (ref 3.87–5.11)
RDW: 13.9 % (ref 11.5–15.5)
WBC: 8.5 10*3/uL (ref 4.0–10.5)
nRBC: 0 % (ref 0.0–0.2)

## 2019-06-02 LAB — BRAIN NATRIURETIC PEPTIDE: B Natriuretic Peptide: 383.8 pg/mL — ABNORMAL HIGH (ref 0.0–100.0)

## 2019-06-02 NOTE — Patient Instructions (Signed)
Labs done today, we will call you for abnormal results  Please call our office in July to schedule your follow up appointment  If you have any questions or concerns before your next appointment please send [place] a message through [place] or call our office at [phone].  At the [facility], you and your health needs are our priority. As part of our continuing mission to provide you with exceptional heart care, we have created designated Provider Care Teams. These Care Teams include your primary Cardiologist (physician) and Advanced Practice Providers (APPs- Physician Assistants and Nurse Practitioners) who all work together to provide you with the care you need, when you need it.   You may see any of the following providers on your designated Care Team at your next follow up: Marland Kitchen Dr Glori Bickers . Dr Loralie Champagne . Darrick Grinder, NP . Lyda Jester, PA . Audry Riles, PharmD   Please be sure to bring in all your medications bottles to every appointment.

## 2019-06-02 NOTE — Progress Notes (Signed)
Advanced [facility] Failure Clinic Note   Date:  06/02/2019   ID:  Ruth Roth, DOB 1928-06-25, MRN [id number]  Location: Home  Provider location: [facility] Type of Visit: Established patient  PCP:  Einar Pheasant, MD  Cardiologist:  [name], MD Primary HF: Chelcy Bolda  Chief Complaint: Heart Failure follow-up   History of Present Illness:  Ruth Roth is a 86 y.o. female with a history of 86 year old female with a history of coronary artery disease, chronic diastolic congestive heart failure, persistent atrial fibrillation, hypertension, peptic ulcer disease, fibrocystic breast disease, inflammatory arthritis, autoimmune hepatitis, and neuropathy.  Admitted to [facility] 08/21/17 s/p R/LHC which showed significantly elevated filling pressures, severe MR, and low cardiac output, and then transferred to [facility] 6/14 for consideration of MitraClip. AKI noted on admission felt to be due to cardiorenal syndrome. She required milrinone and lasix for diuresis. Diuresed 25 lbs and transitioned to 20 mg torsemide daily. Case and images reviewed at length with structural heart team. MR felt to be truly functional with improvement from hemodynamic optimization. Not felt to be MitraClip candidate at this time.Discharge weight: 121 lbs.   She presents for routine f/u today. In 2020 had recurrent AF with RVR Amio 200 bid started. Underwent DC-CV on 9/210/20. PCP cut back torsemide to 20 qOD due to AKI but developed volume overload. Torsemide increased back to 20 daily. Developed recurrent AF and Amio stopped. Saw Dr. Fletcher Anon in 04/30/19 and metoprolol increased to 75 bid for better rate control. Says she felt better on the 50 but is tolerating this ok. Breathing pretty good except for sinuses. Was placed on steroids. Has gained 5 pounds. Taking torsemide 20 daily. No edema, orthopnea or PND. No bleeding on Xarelto.     Cardiac Studies: Echo 10/09/17: EF 55-60%, no AI, mild to  mod MR, LA moderately dilated, RA mildly dilated, RV normal, PA peak pressure 52 mmHg  [facility] 08/21/2017 - Mild non-obstructive CAD RHC Procedural Findings:(Obtained from Scan 08/21/17 at 1030) Hemodynamics (mmHg) RA mean17 RV68/11 (19) PA62/27 (44) PCWP41 AO147/67 (102) Cardiac Output(Fick) 2.45 Cardiac Index(Fick) 1.46 PVR 1.23 WU  Echo 08/01/17 LVEF 50-55%, Mild AS, Mod MR, Mod LAE, Mild RAE, Mod TR, PA peak pressure 65 mm Hg.MR ++ calcified, and RV mild/moderately reduced function.      Past Medical History:  Diagnosis Date  . (HFpEF) heart failure with preserved ejection fraction ([place])    a. 07/2017 Echo: EF 50-55%, no rwma, mild AS/AI, mod MR, mod dil LA, mod TR, PASP 70mmHg.  . Autoimmune hepatitis ([place])    followed by Dr [name]  . Fibrocystic breast disease   . Glaucoma   . Hypercholesterolemia   . Hypertension   . Hypothyroidism   . Inflammatory arthritis   . MI (myocardial infarction) ([place])   . Neuropathy   . Non-obstructive Coronary artery disease    a. 05/2014 NSTEMI/Cath: LM nl, LAD mild dzs, LCX 60ost hazy (? culprit), RCA mild dzs. EF 60% by echo-->Med Rx; b. 08/2017 Cath: LM 30ost, LAD min irregs, LCX small, 40ost/p, RCA large, min irregs, RPDA/RPL1 nl, EF 50-55%. 4+MR.  . Osteoarthritis   . Permanent atrial fibrillation (HCC)    a. CHA2DS2VASc = 6-->Xarelto.  . 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 Severe mitral regurgitation    a. 07/2017 Echo: Mod MR; b. 08/2017 Cath: 4+/Severe MR.   Past Surgical History:  Procedure Laterality Date  .  ABDOMINAL HYSTERECTOMY  1963   partial, secondary to fibroids  . APPENDECTOMY    . Billroth    . CARDIAC CATHETERIZATION  05/12/14   [facility]  . CARDIOVERSION N/A 12/26/2016   Procedure: CARDIOVERSION;  Surgeon: [name], MD;  Location: [facility] ORS;  Service: Cardiovascular;  Laterality: N/A;  . CARDIOVERSION N/A 05/16/2017   Procedure: CARDIOVERSION;   Surgeon: [name], MD;  Location: [facility] ORS;  Service: Cardiovascular;  Laterality: N/A;  . CARDIOVERSION N/A 06/09/2017   Procedure: CARDIOVERSION;  Surgeon: [name], MD;  Location: [facility] ORS;  Service: Cardiovascular;  Laterality: N/A;  . CARDIOVERSION N/A 11/30/2018   Procedure: CARDIOVERSION;  Surgeon: [name], MD;  Location: [facility] ORS;  Service: Cardiovascular;  Laterality: N/A;  . CHOLECYSTECTOMY  1998  . COLONOSCOPY  2009  . RIGHT/LEFT HEART CATH AND CORONARY ANGIOGRAPHY Bilateral 08/21/2017   Procedure: RIGHT/LEFT HEART CATH AND CORONARY ANGIOGRAPHY;  Surgeon: [name], MD;  Location: [place] [facility];  Service: Cardiovascular;  Laterality: Bilateral;  . TEE WITHOUT CARDIOVERSION N/A 08/25/2017   Procedure: TRANSESOPHAGEAL ECHOCARDIOGRAM (TEE);  Surgeon: Jolaine Artist, MD;  Location: [facility] ENDOSCOPY;  Service: Cardiovascular;  Laterality: N/A;  . UPPER GI ENDOSCOPY  2004     Current Outpatient Medications  Medication Sig Dispense Refill  . brimonidine-timolol (COMBIGAN) 0.2-0.5 % ophthalmic solution Place 1 drop into the left eye 2 (two) times daily.     . calcitRIOL (ROCALTROL) 0.25 MCG capsule Take 0.25 mcg by mouth daily.     . cefdinir (OMNICEF) 300 MG capsule Take 300 mg by mouth 2 (two) times daily.    . dorzolamide (TRUSOPT) 2 % ophthalmic solution Place 1 drop into the left eye 2 (two) times daily.    . fluticasone (FLONASE) 50 MCG/ACT nasal spray Place 2 sprays into both nostrils daily as needed for allergies.     Marland Kitchen gabapentin (NEURONTIN) 300 MG capsule TAKE 2 CAPSULES BY MOUTH FOUR TIMES DAILY (Patient taking differently: Take 300-600 mg by mouth See admin instructions. 300 mg twice daily, 600 mg at bedtime) 720 capsule 0  . gentamicin ointment (GARAMYCIN) 0.1 % PLEASE SEE ATTACHED FOR DETAILED DIRECTIONS    . ipratropium (ATROVENT) 0.06 % nasal spray PLACE 1 SPRAY INTO BOTH NOSTRILS DAILY AS NEEDED FOR RHINITIS. 15 mL 0  . latanoprost  (XALATAN) 0.005 % ophthalmic solution Place 1 drop into both eyes at bedtime.     Marland Kitchen levothyroxine (SYNTHROID) 112 MCG tablet Take 112 mcg by mouth daily before breakfast.    . losartan (COZAAR) 25 MG tablet Take 25 mg by mouth daily.     . metoprolol tartrate (LOPRESSOR) 50 MG tablet Take 1.5 tablets (75 mg total) by mouth 2 (two) times daily. 90 tablet 5  . Multiple Vitamin (MULTI-VITAMIN) tablet Take 1 tablet by mouth at bedtime.     . pantoprazole (PROTONIX) 20 MG tablet TAKE 1 TABLET BY MOUTH EVERY DAY 90 tablet 1  . torsemide (DEMADEX) 20 MG tablet Take 1 tablet (20 mg total) by mouth daily. 30 tablet 5  . traMADol (ULTRAM) 50 MG tablet Take 1 tablet (50 mg total) by mouth daily as needed (for pain.). 30 tablet 0  . ursodiol (ACTIGALL) 300 MG capsule Take 300-600 mg by mouth See admin instructions. Take 2 capsules (600 mg) daily in the morning   & 1 capsule (300 mg) at night.    Alveda Reasons 15 MG TABS tablet TAKE 1 TABLET (15 MG TOTAL) BY MOUTH DAILY  WITH SUPPER. 30 tablet 6   No current facility-administered medications for this encounter.    Allergies:   Statins, Haldol [haloperidol lactate], Librax [chlordiazepoxide-clidinium], Plavix [clopidogrel bisulfate], and Ramipril   Social History:  The patient  reports that she has never smoked. She has never used smokeless tobacco. She reports that she does not drink alcohol or use drugs.   Family History:  The patient's family history includes Colon cancer in her daughter; Esophageal cancer in her brother; Ovarian cancer in her daughter; Stroke in her mother.   ROS:  Please see the history of present illness.   All other systems are personally reviewed and negative.   Vitals:   06/02/19 1203  BP: 134/70  Pulse: 84  SpO2: 97%  Weight: 64.6 kg (142 lb 6.4 oz)    Exam:   General:  Elderly. No resp difficulty HEENT: normal Neck: supple. JVP 7. Carotids 2+ bilat; no bruits. No lymphadenopathy or thryomegaly appreciated. Cor: PMI  nondisplaced. Irregular rate & rhythm. Soft MR  Lungs: clear Abdomen: soft, nontender, nondistended. No hepatosplenomegaly. No bruits or masses. Good bowel sounds. Extremities: no cyanosis, clubbing, rash, trace edema Neuro: alert & orientedx3, cranial nerves grossly intact. moves all 4 extremities w/o difficulty. Affect pleasant    Recent Labs: 01/07/2019: Hemoglobin 13.0; Platelets 179 01/28/2019: B Natriuretic Peptide 359.0 02/12/2019: ALT 16; BUN 44; Creatinine, Ser 1.61; Potassium 4.4; Sodium 140; TSH 2.00  Personally reviewed   Wt Readings from Last 3 Encounters:  06/02/19 64.6 kg (142 lb 6.4 oz)  05/13/19 64 kg (141 lb)  04/30/19 64.2 kg (141 lb 8 oz)       ASSESSMENT AND PLAN:  1.Chronic diastolic congestive heart failure with h/o low output: - Echo 08/01/17 LVEF 50-55%, Mild AS, Mod MR, Mod LAE, Mild RAE, Mod TR, RV moderately HK PA peak pressure 65 mm Hg. - Cath 08/21/17 with signficantly elevated filling pressures, severe MR, and low cardiac output. Had cardiorenal syndrome on admit with severe MR and RV failure and required milrinone - Echo 10/09/17: EF 55-60%, mild to mod MR - Doing well despite recurrent AF.  - Echo 12/20 EF 60-65% mod MR  - Stable NYHA II. Volume status mildly elevated in setting of recent steroid course. Continue Torsemide 20 daily (failed reduction to 20 qOD). Will double up tomorrow to help with mild volume overload   2. Severe mitral regurgitation: - Echo 08/01/17 LVEF 50-55%, Mild AS, Mod MR, Mod LAE, Mild RAE, Mod TR, PA peak pressure 65 mm Hg.MR ++ calcified, and RV mild/moderately reduced function. - TEE 6/17 showed only mild MR with restricted posterior leaflet.  -Echo 10/09/17: EF 55-60%, mild to mod MR -  Echo 12/20 EF 60-65% mod MR  -Likely functional MR and MR improved with hemodynamic optimization.  Doing well now. Will follow closely if fails close medical therapy consider MitraClip  3. Chronic atrial fibrillation: - Has  previously failed amiodarone and DCCV x3.  - Failed DC-CV with amio on 11/30/18 - Doubt we will be able to maintain NSR. Seems to be tolerating AF ok. If cannot maintain rate control will need AVN ablation and CRT  - Continue Lopressor 75 bid. (dose recently increased by Dr. Fletcher Anon and rate better) - Continue Xarelto. No bleeding. CBC today - Seen by Dr. Rayann Heman 12/01/17. He did not recommend either afib ablation or AV node ablation + PPM at that time  4. Essential hypertension: -Stable. Well controlled.   5. CKD 3 with h/o AKI - Creatinine 1.27 on  11/19/18 - repeat today   Signed, Glori Bickers, MD  06/02/2019 12:50 PM  [facility] [address] [facility] [place] [address] [phone] (office) [phone] (fax)

## 2019-06-08 DIAGNOSIS — J301 Allergic rhinitis due to pollen: Secondary | ICD-10-CM | POA: Diagnosis not present

## 2019-06-08 DIAGNOSIS — J0100 Acute maxillary sinusitis, unspecified: Secondary | ICD-10-CM | POA: Diagnosis not present

## 2019-06-12 IMAGING — CR DG CHEST 2V
2 series · 2 of 2 positions shown · non-contrast
Comparison: Prior chest x-ray 05/10/2014

CLINICAL DATA: 88-year-old female with shortness of breath for the
past 2 days

EXAM:
CHEST  2 VIEW

[chest pa]
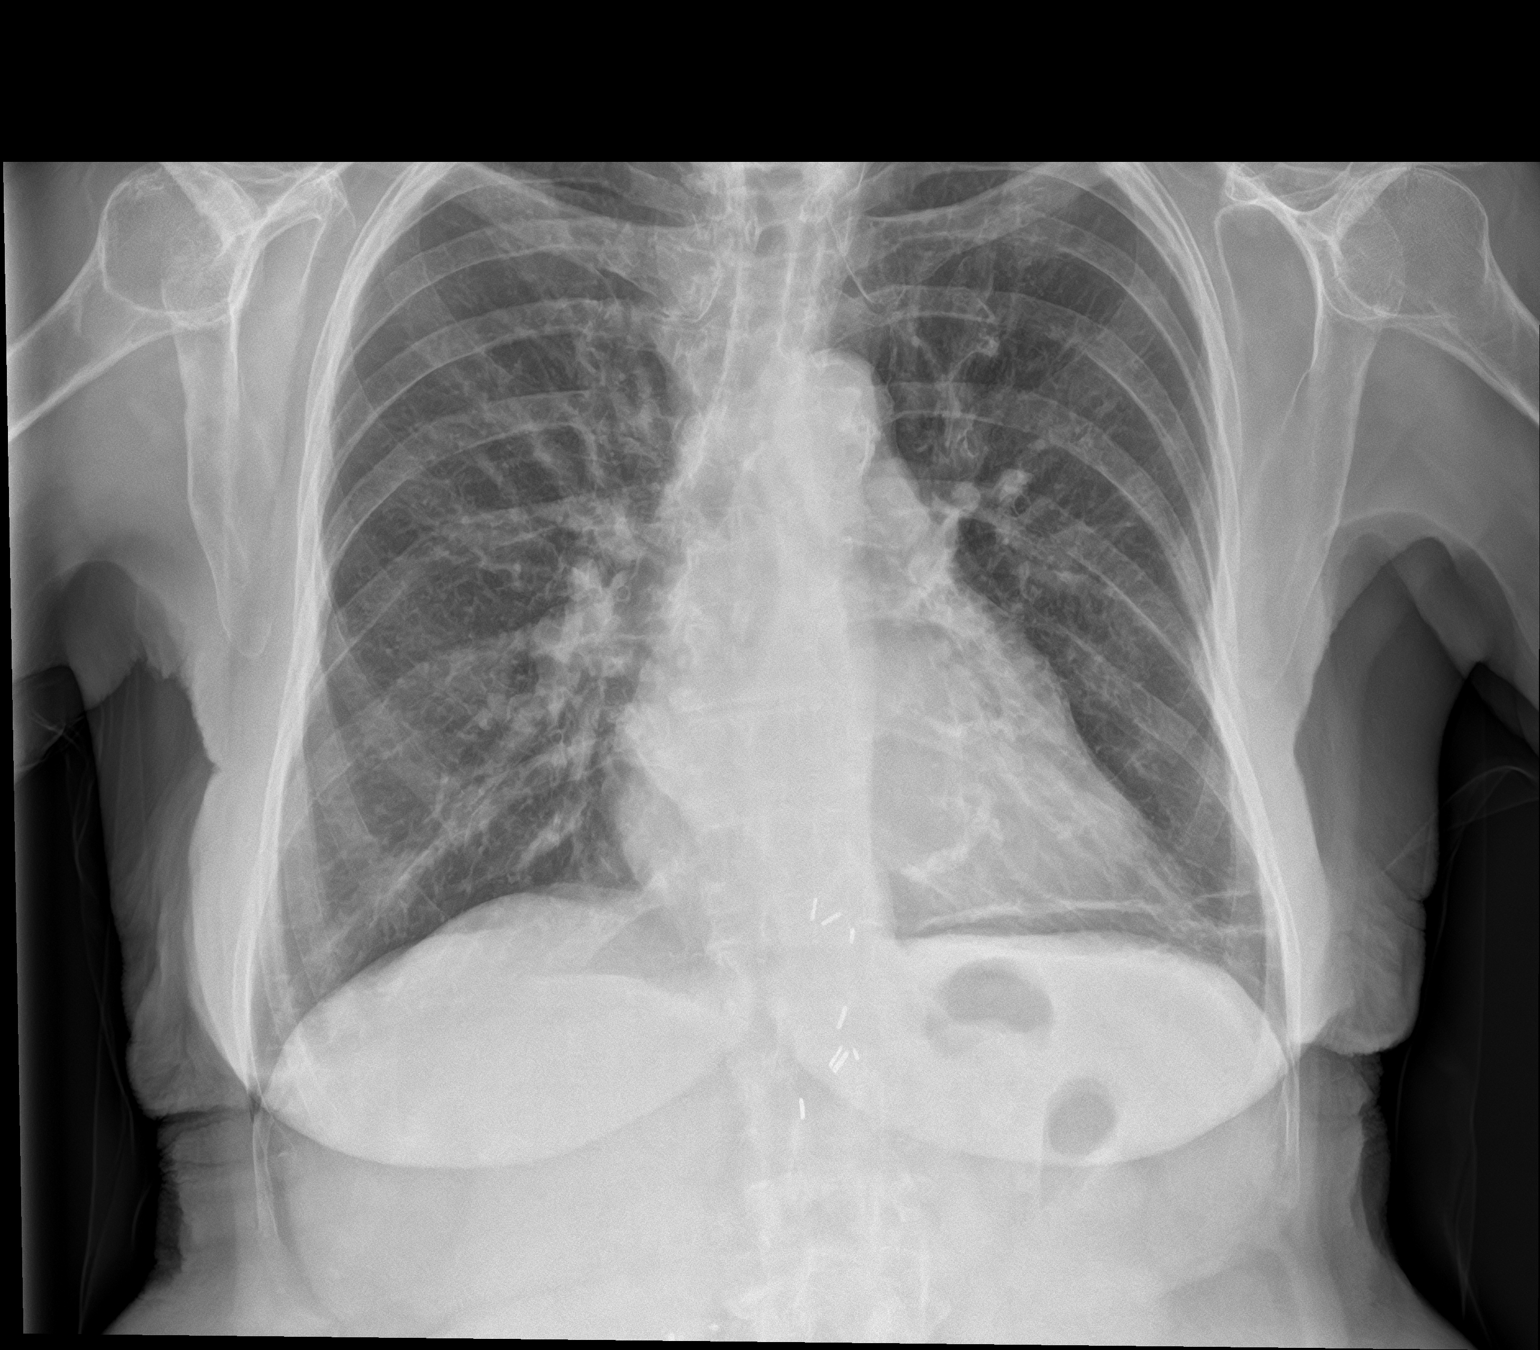

[chest lat]
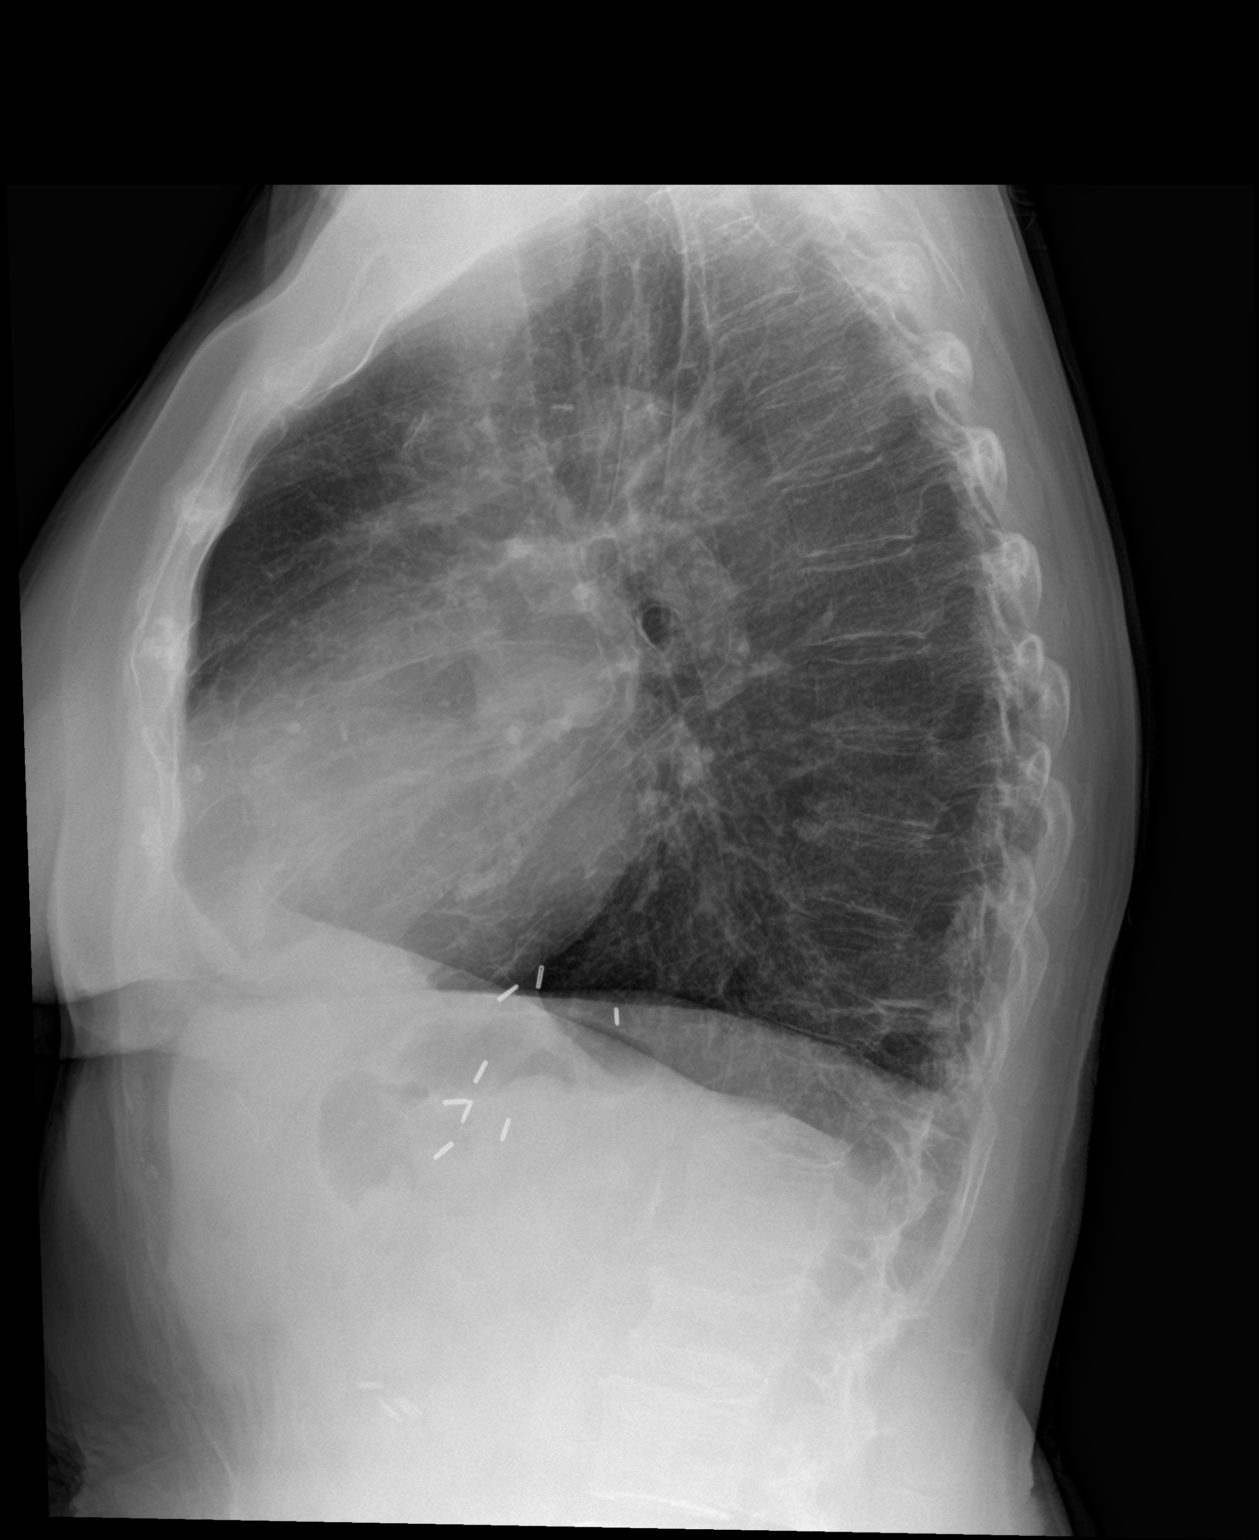

[2 of 2 positions shown; findings below may reference images not displayed]

FINDINGS: Stable cardiomegaly. Mild pulmonary vascular congestion without
overt edema. No pleural effusion, pneumothorax or focal airspace
consolidation. Stable linear scarring versus chronic atelectasis in
the left lower lobe. Persistent pulmonary hyperinflation and central
bronchitic changes. No acute osseous abnormality. Atherosclerotic
calcifications again present in the transverse aorta.
IMPRESSION: 1. No acute cardiopulmonary process.
2. Stable mild cardiomegaly and pulmonary vascular congestion
without overt edema.
3. Stable hyperinflation and bronchitic changes consistent with
underlying COPD.
4. Chronic linear atelectasis versus scarring in the left lung base.
5.  Aortic Atherosclerosis ([id number]-170.0)

## 2019-06-17 ENCOUNTER — Other Ambulatory Visit: Payer: Self-pay | Admitting: Gastroenterology

## 2019-06-17 DIAGNOSIS — K7460 Unspecified cirrhosis of liver: Secondary | ICD-10-CM | POA: Diagnosis not present

## 2019-06-23 ENCOUNTER — Ambulatory Visit
Admission: RE | Admit: 2019-06-23 | Discharge: 2019-06-23 | Disposition: A | Discharge status: Home / Self Care | Payer: Medicare Other | Source: Ambulatory Visit | Attending: Gastroenterology | Admitting: Gastroenterology

## 2019-06-23 ENCOUNTER — Other Ambulatory Visit: Payer: Self-pay

## 2019-06-23 DIAGNOSIS — K7460 Unspecified cirrhosis of liver: Secondary | ICD-10-CM | POA: Diagnosis not present

## 2019-06-29 ENCOUNTER — Other Ambulatory Visit: Payer: Medicare Other

## 2019-07-01 ENCOUNTER — Ambulatory Visit: Payer: Medicare Other | Admitting: Internal Medicine

## 2019-07-05 DIAGNOSIS — K754 Autoimmune hepatitis: Secondary | ICD-10-CM | POA: Diagnosis not present

## 2019-07-06 DIAGNOSIS — H401131 Primary open-angle glaucoma, bilateral, mild stage: Secondary | ICD-10-CM | POA: Diagnosis not present

## 2019-07-07 ENCOUNTER — Inpatient Hospital Stay: Payer: Medicare Other | Attending: Oncology

## 2019-07-07 ENCOUNTER — Other Ambulatory Visit: Payer: Self-pay

## 2019-07-07 DIAGNOSIS — D500 Iron deficiency anemia secondary to blood loss (chronic): Secondary | ICD-10-CM

## 2019-07-07 DIAGNOSIS — R5383 Other fatigue: Secondary | ICD-10-CM | POA: Diagnosis not present

## 2019-07-07 DIAGNOSIS — Z8042 Family history of malignant neoplasm of prostate: Secondary | ICD-10-CM | POA: Diagnosis not present

## 2019-07-07 DIAGNOSIS — Z8711 Personal history of peptic ulcer disease: Secondary | ICD-10-CM | POA: Insufficient documentation

## 2019-07-07 DIAGNOSIS — M1990 Unspecified osteoarthritis, unspecified site: Secondary | ICD-10-CM | POA: Insufficient documentation

## 2019-07-07 DIAGNOSIS — Z800 Family history of malignant neoplasm of digestive organs: Secondary | ICD-10-CM | POA: Diagnosis not present

## 2019-07-07 DIAGNOSIS — E039 Hypothyroidism, unspecified: Secondary | ICD-10-CM | POA: Insufficient documentation

## 2019-07-07 DIAGNOSIS — I2510 Atherosclerotic heart disease of native coronary artery without angina pectoris: Secondary | ICD-10-CM | POA: Diagnosis not present

## 2019-07-07 DIAGNOSIS — Z7901 Long term (current) use of anticoagulants: Secondary | ICD-10-CM | POA: Diagnosis not present

## 2019-07-07 DIAGNOSIS — I110 Hypertensive heart disease with heart failure: Secondary | ICD-10-CM | POA: Insufficient documentation

## 2019-07-07 DIAGNOSIS — I5032 Chronic diastolic (congestive) heart failure: Secondary | ICD-10-CM | POA: Diagnosis not present

## 2019-07-07 DIAGNOSIS — Z801 Family history of malignant neoplasm of trachea, bronchus and lung: Secondary | ICD-10-CM | POA: Insufficient documentation

## 2019-07-07 DIAGNOSIS — I252 Old myocardial infarction: Secondary | ICD-10-CM | POA: Insufficient documentation

## 2019-07-07 DIAGNOSIS — I4821 Permanent atrial fibrillation: Secondary | ICD-10-CM | POA: Insufficient documentation

## 2019-07-07 DIAGNOSIS — Z888 Allergy status to other drugs, medicaments and biological substances status: Secondary | ICD-10-CM | POA: Diagnosis not present

## 2019-07-07 DIAGNOSIS — Z823 Family history of stroke: Secondary | ICD-10-CM | POA: Diagnosis not present

## 2019-07-07 DIAGNOSIS — D509 Iron deficiency anemia, unspecified: Secondary | ICD-10-CM | POA: Diagnosis not present

## 2019-07-07 DIAGNOSIS — Z79899 Other long term (current) drug therapy: Secondary | ICD-10-CM | POA: Insufficient documentation

## 2019-07-07 LAB — CBC WITH DIFFERENTIAL/PLATELET
Abs Immature Granulocytes: 0.05 10*3/uL (ref 0.00–0.07)
Basophils Absolute: 0 10*3/uL (ref 0.0–0.1)
Basophils Relative: 1 %
Eosinophils Absolute: 0.4 10*3/uL (ref 0.0–0.5)
Eosinophils Relative: 6 %
HCT: 38.9 % (ref 36.0–46.0)
Hemoglobin: 12.3 g/dL (ref 12.0–15.0)
Immature Granulocytes: 1 %
Lymphocytes Relative: 16 %
Lymphs Abs: 1.1 10*3/uL (ref 0.7–4.0)
MCH: 32.1 pg (ref 26.0–34.0)
MCHC: 31.6 g/dL (ref 30.0–36.0)
MCV: 101.6 fL — ABNORMAL HIGH (ref 80.0–100.0)
Monocytes Absolute: 0.6 10*3/uL (ref 0.1–1.0)
Monocytes Relative: 10 %
Neutro Abs: 4.4 10*3/uL (ref 1.7–7.7)
Neutrophils Relative %: 66 %
Platelets: 232 10*3/uL (ref 150–400)
RBC: 3.83 MIL/uL — ABNORMAL LOW (ref 3.87–5.11)
RDW: 15.9 % — ABNORMAL HIGH (ref 11.5–15.5)
WBC: 6.5 10*3/uL (ref 4.0–10.5)
nRBC: 0 % (ref 0.0–0.2)

## 2019-07-07 LAB — IRON AND TIBC
Iron: 52 ug/dL (ref 28–170)
Saturation Ratios: 13 % (ref 10.4–31.8)
TIBC: 406 ug/dL (ref 250–450)
UIBC: 354 ug/dL

## 2019-07-07 LAB — FERRITIN: Ferritin: 37 ng/mL (ref 11–307)

## 2019-07-08 ENCOUNTER — Other Ambulatory Visit: Payer: Self-pay

## 2019-07-08 ENCOUNTER — Other Ambulatory Visit (INDEPENDENT_AMBULATORY_CARE_PROVIDER_SITE_OTHER): Payer: Medicare Other

## 2019-07-08 DIAGNOSIS — E7800 Pure hypercholesterolemia, unspecified: Secondary | ICD-10-CM

## 2019-07-08 DIAGNOSIS — I10 Essential (primary) hypertension: Secondary | ICD-10-CM

## 2019-07-08 LAB — LIPID PANEL
Cholesterol: 126 mg/dL (ref 0–200)
HDL: 44.2 mg/dL (ref >39.00)
LDL Cholesterol: 57 mg/dL (ref 0–99)
NonHDL: 81.94
Total CHOL/HDL Ratio: 3
Triglycerides: 124 mg/dL (ref 0.0–149.0)
VLDL: 24.8 mg/dL (ref 0.0–40.0)

## 2019-07-08 LAB — BASIC METABOLIC PANEL
BUN: 49 mg/dL — ABNORMAL HIGH (ref 6–23)
CO2: 26 mEq/L (ref 19–32)
Calcium: 9.9 mg/dL (ref 8.4–10.5)
Chloride: 102 mEq/L (ref 96–112)
Creatinine, Ser: 1.77 mg/dL — ABNORMAL HIGH (ref 0.40–1.20)
GFR: 27.01 mL/min — ABNORMAL LOW (ref >60.00)
Glucose, Bld: 97 mg/dL (ref 70–99)
Potassium: 4.1 mEq/L (ref 3.5–5.1)
Sodium: 139 mEq/L (ref 135–145)

## 2019-07-08 LAB — HEPATIC FUNCTION PANEL
ALT: 18 U/L (ref 0–35)
AST: 15 U/L (ref 0–37)
Albumin: 4 g/dL (ref 3.5–5.2)
Alkaline Phosphatase: 85 U/L (ref 39–117)
Bilirubin, Direct: 0.2 mg/dL (ref 0.0–0.3)
Total Bilirubin: 0.5 mg/dL (ref 0.2–1.2)
Total Protein: 6.9 g/dL (ref 6.0–8.3)

## 2019-07-09 ENCOUNTER — Inpatient Hospital Stay (HOSPITAL_BASED_OUTPATIENT_CLINIC_OR_DEPARTMENT_OTHER): Payer: Medicare Other | Admitting: Oncology

## 2019-07-09 ENCOUNTER — Inpatient Hospital Stay: Payer: Medicare Other

## 2019-07-09 ENCOUNTER — Encounter: Payer: Self-pay | Admitting: Oncology

## 2019-07-09 ENCOUNTER — Telehealth: Payer: Self-pay | Admitting: *Deleted

## 2019-07-09 ENCOUNTER — Other Ambulatory Visit: Payer: Self-pay

## 2019-07-09 VITALS — BP 93/65 | HR 71 | Temp 97.7°F | Resp 18 | Wt 139.0 lb

## 2019-07-09 DIAGNOSIS — D500 Iron deficiency anemia secondary to blood loss (chronic): Secondary | ICD-10-CM

## 2019-07-09 DIAGNOSIS — D509 Iron deficiency anemia, unspecified: Secondary | ICD-10-CM | POA: Diagnosis not present

## 2019-07-09 DIAGNOSIS — I252 Old myocardial infarction: Secondary | ICD-10-CM | POA: Diagnosis not present

## 2019-07-09 DIAGNOSIS — I110 Hypertensive heart disease with heart failure: Secondary | ICD-10-CM | POA: Diagnosis not present

## 2019-07-09 DIAGNOSIS — R5383 Other fatigue: Secondary | ICD-10-CM | POA: Diagnosis not present

## 2019-07-09 DIAGNOSIS — E039 Hypothyroidism, unspecified: Secondary | ICD-10-CM | POA: Diagnosis not present

## 2019-07-09 DIAGNOSIS — I4821 Permanent atrial fibrillation: Secondary | ICD-10-CM | POA: Diagnosis not present

## 2019-07-09 NOTE — Progress Notes (Signed)
Hematology/Oncology follow up note [facility] [phone] [phone]   Patient Care Team: Einar Pheasant, MD as PCP - General (Unknown Physician Specialty) [name], MD as PCP - Cardiology (Cardiology) Bensimhon, Shaune Pascal, MD as PCP - Advanced Heart Failure (Cardiology) Byrnett, Forest Gleason, MD (General Surgery) Earlie Server, MD as Consulting Physician (Oncology)  REFERRING PROVIDER: Einar Pheasant, MD CHIEF COMPLAINTS/REASON FOR VISIT:  Evaluation of anemia  HISTORY OF PRESENTING ILLNESS:  Ruth Roth is a  86 y.o.  female with PMH listed below who was referred to me for evaluation of anemia Reviewed patient's recent labs, Labs revealed anemia with hemoglobin of 10.2 on 01/27/2018. MCV 88.9,   Reviewed patient's previous labs ordered by primary care physician's office, anemia is chronic onset , duration is since 2016, gradually declines. No aggravating or improving factors. She takes Xarelto for A Fib. Has recurrent epistaxis. History of IDA, cannot tolerate oral iron supplements.   Associated signs and symptoms: Patient reports fatigue.  deniesSOB with exertion.  Denies weight loss, easy bruising, hematochezia, hemoptysis, hematuria. Context: History of GI bleeding: deneis               History of Chronic kidney disease: CKD               Last colonoscopy: 2007 Patient has a history of autoimmune hepatitis/PBC  Follows up with gastroenterology.  # received IV Feraheme 510 mg x 2. INTERVAL HISTORY Ruth Roth is a 86 y.o. female who has above history reviewed by me today presents for follow up visit for management of anemia Problems and complaints are listed below:  Patient is on chronic anticoagulation with Xarelto for atrial fibrillation.  Denies any bleeding events. She reports doing well.  No fatigue. .   Review of Systems  Constitutional: Negative for appetite change, chills, fatigue and fever.  HENT:   Negative for  hearing loss and voice change.   Eyes: Negative for eye problems.  Respiratory: Negative for chest tightness and cough.   Cardiovascular: Negative for chest pain.  Gastrointestinal: Negative for abdominal distention, abdominal pain and blood in stool.  Endocrine: Negative for hot flashes.  Genitourinary: Negative for difficulty urinating and frequency.   Musculoskeletal: Negative for arthralgias.  Skin: Negative for itching and rash.  Neurological: Negative for extremity weakness.  Hematological: Negative for adenopathy.  Psychiatric/Behavioral: Negative for confusion.    MEDICAL HISTORY:  Past Medical History:  Diagnosis Date  . (HFpEF) heart failure with preserved ejection fraction ([place])    a. 07/2017 Echo: EF 50-55%, no rwma, mild AS/AI, mod MR, mod dil LA, mod TR, PASP 66mmHg.  . Autoimmune hepatitis ([place])    followed by Dr [name]  . Fibrocystic breast disease   . Glaucoma   . Hypercholesterolemia   . Hypertension   . Hypothyroidism   . Inflammatory arthritis   . MI (myocardial infarction) ([place])   . Neuropathy   . Non-obstructive Coronary artery disease    a. 05/2014 NSTEMI/Cath: LM nl, LAD mild dzs, LCX 60ost hazy (? culprit), RCA mild dzs. EF 60% by echo-->Med Rx; b. 08/2017 Cath: LM 30ost, LAD min irregs, LCX small, 40ost/p, RCA large, min irregs, RPDA/RPL1 nl, EF 50-55%. 4+MR.  . Osteoarthritis   . Permanent atrial fibrillation (HCC)    a. CHA2DS2VASc = 6-->Xarelto.  . 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 Severe mitral regurgitation    a. 07/2017 Echo: Mod MR;  b. 08/2017 Cath: 4+/Severe MR.    SURGICAL HISTORY: Past Surgical History:  Procedure Laterality Date  . ABDOMINAL HYSTERECTOMY  1963   partial, secondary to fibroids  . APPENDECTOMY    . Billroth    . CARDIAC CATHETERIZATION  05/12/14   [facility]  . CARDIOVERSION N/A 12/26/2016   Procedure: CARDIOVERSION;  Surgeon: [name], MD;  Location: [facility]  ORS;  Service: Cardiovascular;  Laterality: N/A;  . CARDIOVERSION N/A 05/16/2017   Procedure: CARDIOVERSION;  Surgeon: [name], MD;  Location: [facility] ORS;  Service: Cardiovascular;  Laterality: N/A;  . CARDIOVERSION N/A 06/09/2017   Procedure: CARDIOVERSION;  Surgeon: [name], MD;  Location: [facility] ORS;  Service: Cardiovascular;  Laterality: N/A;  . CARDIOVERSION N/A 11/30/2018   Procedure: CARDIOVERSION;  Surgeon: [name], MD;  Location: [facility] ORS;  Service: Cardiovascular;  Laterality: N/A;  . CHOLECYSTECTOMY  1998  . COLONOSCOPY  2009  . RIGHT/LEFT HEART CATH AND CORONARY ANGIOGRAPHY Bilateral 08/21/2017   Procedure: RIGHT/LEFT HEART CATH AND CORONARY ANGIOGRAPHY;  Surgeon: [name], MD;  Location: [facility] LAB;  Service: Cardiovascular;  Laterality: Bilateral;  . TEE WITHOUT CARDIOVERSION N/A 08/25/2017   Procedure: TRANSESOPHAGEAL ECHOCARDIOGRAM (TEE);  Surgeon: Jolaine Artist, MD;  Location: [facility] ENDOSCOPY;  Service: Cardiovascular;  Laterality: N/A;  . UPPER GI ENDOSCOPY  2004    SOCIAL HISTORY: Social History   Socioeconomic History  . Marital status: Widowed    Spouse name: Not on file  . Number of children: 2  . Years of education: Not on file  . Highest education level: Not on file  Occupational History  . Occupation: retired  Tobacco Use  . Smoking status: Never Smoker  . Smokeless tobacco: Never Used  Substance and Sexual Activity  . Alcohol use: No    Alcohol/week: 0.0 standard drinks  . Drug use: No  . Sexual activity: Never  Other Topics Concern  . Not on file  Social History Narrative  . Not on file   Social Determinants of Health   Financial Resource Strain:   . Difficulty of Paying Living Expenses:   Food Insecurity:   . Worried About Charity fundraiser in the Last Year:   . Arboriculturist in the Last Year:   Transportation Needs:   . Film/video editor (Medical):   Marland Kitchen Lack of Transportation (Non-Medical):    Physical Activity:   . Days of Exercise per Week:   . Minutes of Exercise per Session:   Stress:   . Feeling of Stress :   Social Connections: Unknown  . Frequency of Communication with Friends and Family: More than three times a week  . Frequency of Social Gatherings with Friends and Family: Not on file  . Attends Religious Services: Not on file  . Active Member of Clubs or Organizations: Not on file  . Attends Archivist Meetings: Not on file  . Marital Status: Not on file  Intimate Partner Violence: Not At Risk  . Fear of Current or Ex-Partner: No  . Emotionally Abused: No  . Physically Abused: No  . Sexually Abused: No    FAMILY HISTORY: Family History  Problem Relation Age of Onset  . Stroke Mother   . Esophageal cancer Brother        also had lung cancer  . Ovarian cancer Daughter   . Colon cancer Daughter   . Breast cancer Neg Hx     ALLERGIES:  is allergic  to statins; haldol [haloperidol lactate]; librax [chlordiazepoxide-clidinium]; plavix [clopidogrel bisulfate]; and ramipril.  MEDICATIONS:  Current Outpatient Medications  Medication Sig Dispense Refill  . brimonidine-timolol (COMBIGAN) 0.2-0.5 % ophthalmic solution Place 1 drop into the left eye 2 (two) times daily.     . calcitRIOL (ROCALTROL) 0.25 MCG capsule Take 0.25 mcg by mouth daily.     . dorzolamide (TRUSOPT) 2 % ophthalmic solution Place 1 drop into the left eye 2 (two) times daily.    . fluticasone (FLONASE) 50 MCG/ACT nasal spray Place 2 sprays into both nostrils daily as needed for allergies.     Marland Kitchen gabapentin (NEURONTIN) 300 MG capsule TAKE 2 CAPSULES BY MOUTH FOUR TIMES DAILY (Patient taking differently: Take 300-600 mg by mouth See admin instructions. 300 mg twice daily, 600 mg at bedtime) 720 capsule 0  . ipratropium (ATROVENT) 0.06 % nasal spray PLACE 1 SPRAY INTO BOTH NOSTRILS DAILY AS NEEDED FOR RHINITIS. 15 mL 0  . latanoprost (XALATAN) 0.005 % ophthalmic solution Place 1 drop into  both eyes at bedtime.     Marland Kitchen levothyroxine (SYNTHROID) 112 MCG tablet Take 112 mcg by mouth daily before breakfast.    . losartan (COZAAR) 25 MG tablet Take 25 mg by mouth daily.     . metoprolol tartrate (LOPRESSOR) 50 MG tablet Take 1.5 tablets (75 mg total) by mouth 2 (two) times daily. 90 tablet 5  . Multiple Vitamin (MULTI-VITAMIN) tablet Take 1 tablet by mouth at bedtime.     . pantoprazole (PROTONIX) 20 MG tablet TAKE 1 TABLET BY MOUTH EVERY DAY 90 tablet 1  . torsemide (DEMADEX) 20 MG tablet Take 1 tablet (20 mg total) by mouth daily. 30 tablet 5  . traMADol (ULTRAM) 50 MG tablet Take 1 tablet (50 mg total) by mouth daily as needed (for pain.). 30 tablet 0  . ursodiol (ACTIGALL) 300 MG capsule Take 300-600 mg by mouth See admin instructions. Take 2 capsules (600 mg) daily in the morning   & 1 capsule (300 mg) at night.    Alveda Reasons 15 MG TABS tablet TAKE 1 TABLET (15 MG TOTAL) BY MOUTH DAILY WITH SUPPER. 30 tablet 6  . cefdinir (OMNICEF) 300 MG capsule Take 300 mg by mouth 2 (two) times daily.    Marland Kitchen gentamicin ointment (GARAMYCIN) 0.1 % PLEASE SEE ATTACHED FOR DETAILED DIRECTIONS     No current facility-administered medications for this visit.     PHYSICAL EXAMINATION: ECOG PERFORMANCE STATUS: 1 - Symptomatic but completely ambulatory Vitals:   07/09/19 1310  BP: 93/65  Pulse: 71  Resp: 18  Temp: 97.7 F (36.5 C)   Filed Weights   07/09/19 1310  Weight: 139 lb (63 kg)    Physical Exam Constitutional:      General: She is not in acute distress. HENT:     Head: Normocephalic and atraumatic.  Eyes:     General: No scleral icterus.    Pupils: Pupils are equal, round, and reactive to light.  Cardiovascular:     Rate and Rhythm: Normal rate and regular rhythm.     Heart sounds: Normal heart sounds.     Comments: Irregular rhythm.  Pulmonary:     Effort: Pulmonary effort is normal. No respiratory distress.     Breath sounds: No wheezing.  Abdominal:     General: Bowel  sounds are normal. There is no distension.     Palpations: Abdomen is soft. There is no mass.     Tenderness: There is no abdominal tenderness.  Musculoskeletal:        General: No deformity. Normal range of motion.     Cervical back: Normal range of motion and neck supple.  Skin:    General: Skin is warm and dry.     Findings: No erythema or rash.  Neurological:     Mental Status: She is alert and oriented to person, place, and time. Mental status is at baseline.     Cranial Nerves: No cranial nerve deficit.     Coordination: Coordination normal.  Psychiatric:        Mood and Affect: Mood normal.      LABORATORY DATA:  I have reviewed the data as listed Lab Results  Component Value Date   WBC 6.5 07/07/2019   HGB 12.3 07/07/2019   HCT 38.9 07/07/2019   MCV 101.6 (H) 07/07/2019   PLT 232 07/07/2019   Recent Labs    07/22/18 0945 07/22/18 0945 07/24/18 0959 08/07/18 1051 11/19/18 1422 01/28/19 1439 02/12/19 1139 06/02/19 1315 07/08/19 1028  NA 140   < >   < >   < > 139 138 140 139 139  K 5.2 No hemolysis seen*   < >   < >   < > 5.2 4.3 4.4 3.7 4.1  CL 108   < >   < >   < > 109* 106 107 107 102  CO2 25   < >   < >   < > 19* 20* 21 20* 26  GLUCOSE 88   < >   < >   < > 93 74 95 85 97  BUN 66*   < >   < >   < > 34* 48* 44* 48* 49*  CREATININE 1.53*   < >   < >   < > 1.27* 1.79* 1.61* 1.58* 1.77*  CALCIUM 9.1   < >   < >   < > 9.0 8.5* 8.8 8.4* 9.9  GFRNONAA  --   --   --   --  38* 25*  --  29*  --   GFRAA  --   --   --   --  44* 29*  --  33*  --   PROT 6.4   < >  --   --   --   --  6.7 6.5 6.9  ALBUMIN 4.0   < >  --   --   --   --  4.3 3.9 4.0  AST 14   < >   < >  --   --   --  12 14* 15  ALT 16   < >   < >  --   --   --  16 41 18  ALKPHOS 91   < >   < >  --   --   --  87 88 85  BILITOT 0.3   < >  --   --   --   --  0.5 0.6 0.5  BILIDIR 0.1  --   --   --   --   --  0.1  --  0.2   < > = values in this interval not displayed.   Iron/TIBC/Ferritin/ %Sat      Component Value Date/Time   IRON 52 07/07/2019 1246   TIBC 406 07/07/2019 1246   FERRITIN 37 07/07/2019 1246   IRONPCTSAT 13 07/07/2019 1246     RADIOGRAPHIC STUDIES: I have personally reviewed the radiological  images as listed and agreed with the findings in the report. 06/17/2018 Ultrasound abdomen complete Heterogeneous echotexture in the liver.  Extrahepatic and intrahepatic.  Duct dilation, stable since prior study. Small hyperechoic area within the spleen the largest 7 mm.  Compatible with a hemangioma.  Stable Prior cholecystectomy. Mildly increased texture in the kidney suggest chronic kidney disease.  ASSESSMENT & PLAN:  1. Iron deficiency anemia due to chronic blood loss   Labs reviewed and discussed with patient. Patient is not anemic.  Hemoglobin remained stable at 12.3. Iron panel showed stable ferritin at 37, iron saturation 13. Hold off IV iron today. Patient is on chronic anticoagulation. Recommend continue to follow-up blood counts in 6 months. Recommend patient to continue multivitamin.  Recommend to repeat labs, MD assessment in 6 months to ensure stability of iron level and hemoglobin level.  Orders Placed This Encounter  Procedures  . CBC with Differential/Platelet    Standing Status:   Future    Standing Expiration Date:   07/08/2020  . Ferritin    Standing Status:   Future    Standing Expiration Date:   07/08/2020  . Iron and TIBC    Standing Status:   Future    Standing Expiration Date:   07/08/2020    All questions were answered. The patient knows to call the clinic with any problems questions or concerns. We spent sufficient time to discuss many aspect of care, questions were answered to patient's satisfaction.   Return of visit: 6 months   Earlie Server, MD, PhD Hematology Oncology [facility] Pager- [phone] 07/09/2019

## 2019-07-09 NOTE — Progress Notes (Signed)
Patient here for follow up. No concerns voiced.  °

## 2019-07-09 NOTE — Telephone Encounter (Signed)
Left message for patient to return call to office concerning lab results.

## 2019-07-09 NOTE — Telephone Encounter (Signed)
-----   Message from Einar Pheasant, MD sent at 07/09/2019  8:01 AM EDT ----- Notify pt that her kidney function has decreased some when compared to the previous check.  She sees Dr Holley R[facility].  Please forward labs to him and also make sure with pt that she has a f/u scheduled with him.  Continue to avoid antiinflammatories.  Cholesterol looks good.  Liver function tests are wnl.

## 2019-07-13 ENCOUNTER — Other Ambulatory Visit: Payer: Self-pay

## 2019-07-13 ENCOUNTER — Ambulatory Visit (INDEPENDENT_AMBULATORY_CARE_PROVIDER_SITE_OTHER): Payer: Medicare Other | Admitting: Internal Medicine

## 2019-07-13 ENCOUNTER — Ambulatory Visit (INDEPENDENT_AMBULATORY_CARE_PROVIDER_SITE_OTHER): Payer: Medicare Other

## 2019-07-13 VITALS — BP 128/72 | HR 87 | Temp 96.8°F | Resp 16 | Ht 63.0 in | Wt 139.0 lb

## 2019-07-13 DIAGNOSIS — D500 Iron deficiency anemia secondary to blood loss (chronic): Secondary | ICD-10-CM | POA: Diagnosis not present

## 2019-07-13 DIAGNOSIS — I272 Pulmonary hypertension, unspecified: Secondary | ICD-10-CM

## 2019-07-13 DIAGNOSIS — R0602 Shortness of breath: Secondary | ICD-10-CM

## 2019-07-13 DIAGNOSIS — Z1231 Encounter for screening mammogram for malignant neoplasm of breast: Secondary | ICD-10-CM

## 2019-07-13 DIAGNOSIS — I5032 Chronic diastolic (congestive) heart failure: Secondary | ICD-10-CM | POA: Diagnosis not present

## 2019-07-13 DIAGNOSIS — I4819 Other persistent atrial fibrillation: Secondary | ICD-10-CM | POA: Diagnosis not present

## 2019-07-13 DIAGNOSIS — E039 Hypothyroidism, unspecified: Secondary | ICD-10-CM | POA: Diagnosis not present

## 2019-07-13 DIAGNOSIS — K754 Autoimmune hepatitis: Secondary | ICD-10-CM

## 2019-07-13 DIAGNOSIS — E7800 Pure hypercholesterolemia, unspecified: Secondary | ICD-10-CM

## 2019-07-13 DIAGNOSIS — K743 Primary biliary cirrhosis: Secondary | ICD-10-CM | POA: Diagnosis not present

## 2019-07-13 DIAGNOSIS — N184 Chronic kidney disease, stage 4 (severe): Secondary | ICD-10-CM | POA: Diagnosis not present

## 2019-07-13 DIAGNOSIS — K219 Gastro-esophageal reflux disease without esophagitis: Secondary | ICD-10-CM | POA: Diagnosis not present

## 2019-07-13 DIAGNOSIS — Z23 Encounter for immunization: Secondary | ICD-10-CM

## 2019-07-13 NOTE — Progress Notes (Signed)
[name], female   DOB: 06/29/1928, 86 y.o.   MRN: [id number]   Subjective:    [name], female    DOB: [birth year], 86 y.o.   MRN: [id number]  HPI This visit occurred during the SARS-CoV-2 public health emergency.  Safety protocols were in place, including screening questions prior to the visit, additional usage of staff PPE, and extensive cleaning of exam room while observing appropriate contact time as indicated for disinfecting solutions.  Patient here for a scheduled follow up.  Here to f/u regarding her afib, hypercholesterolemia and CKD.  She reports noticing some increased sob with exertion.  Since it is warmer, she has been getting outside more.  Noticed being more sob with exertion.  Has to sit and rest.  Is taking torsemide daily.  Weight is stable.  No chest pain.  No cough or congestion.  Due to f/u with Dr Fletcher Anon in 08/2019.  Saw Dr Holley Raring 05/2019 for f/u CKD.  Stable.  No abdominal pain.  Bowels moving.  Seeing GI - f/u cirrhosis.  Labs - immune to hepatitis A.  Does need hepatitis B vaccine.  Ok to get today.    Past Medical History:  Diagnosis Date  . (HFpEF) heart failure with preserved ejection fraction ([place])    a. 07/2017 Echo: EF 50-55%, no rwma, mild AS/AI, mod MR, mod dil LA, mod TR, PASP 46mmHg.  . Autoimmune hepatitis ([place])    followed by Dr [name]  . Fibrocystic breast disease   . Glaucoma   . Hypercholesterolemia   . Hypertension   . Hypothyroidism   . Inflammatory arthritis   . MI (myocardial infarction) ([place])   . Neuropathy   . Non-obstructive Coronary artery disease    a. 05/2014 NSTEMI/Cath: LM nl, LAD mild dzs, LCX 60ost hazy (? culprit), RCA mild dzs. EF 60% by echo-->Med Rx; b. 08/2017 Cath: LM 30ost, LAD min irregs, LCX small, 40ost/p, RCA large, min irregs, RPDA/RPL1 nl, EF 50-55%. 4+MR.  . Osteoarthritis   . Permanent atrial fibrillation (HCC)    a. CHA2DS2VASc = 6-->Xarelto.  . 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 Severe mitral regurgitation    a. 07/2017 Echo: Mod MR; b. 08/2017 Cath: 4+/Severe MR.   Past Surgical History:  Procedure Laterality Date  . ABDOMINAL HYSTERECTOMY  1963   partial, secondary to fibroids  . APPENDECTOMY    . Billroth    . CARDIAC CATHETERIZATION  05/12/14   [facility]  . CARDIOVERSION N/A 12/26/2016   Procedure: CARDIOVERSION;  Surgeon: [name], MD;  Location: [facility];  Service: Cardiovascular;  Laterality: N/A;  . CARDIOVERSION N/A 05/16/2017   Procedure: CARDIOVERSION;  Surgeon: [name], MD;  Location: [facility];  Service: Cardiovascular;  Laterality: N/A;  . CARDIOVERSION N/A 06/09/2017   Procedure: CARDIOVERSION;  Surgeon: [name], MD;  Location: [facility];  Service: Cardiovascular;  Laterality: N/A;  . CARDIOVERSION N/A 11/30/2018   Procedure: CARDIOVERSION;  Surgeon: [name], MD;  Location: [facility];  Service: Cardiovascular;  Laterality: N/A;  . CHOLECYSTECTOMY  1998  . COLONOSCOPY  2009  . RIGHT/LEFT HEART CATH AND CORONARY ANGIOGRAPHY Bilateral 08/21/2017   Procedure: RIGHT/LEFT HEART CATH AND CORONARY ANGIOGRAPHY;  Surgeon: [name], MD;  Location: [facility];  Service: Cardiovascular;  Laterality: Bilateral;  . TEE WITHOUT CARDIOVERSION N/A 08/25/2017   Procedure: TRANSESOPHAGEAL ECHOCARDIOGRAM (TEE);  Surgeon: Glori Bickers  R, MD;  Location: [facility];  Service: Cardiovascular;  Laterality: N/A;  . UPPER GI ENDOSCOPY  2004   Family History  Problem Relation Age of Onset  . Stroke Mother   . Esophageal cancer Brother        also had lung cancer  . Ovarian cancer Daughter   . Colon cancer Daughter   . Breast cancer Neg Hx    Social History   Socioeconomic History  . Marital status: Widowed    Spouse name: Not on file  . Number of children: 2  . Years of education: Not on file  . Highest education level: Not on file  Occupational History  .  Occupation: retired  Tobacco Use  . Smoking status: Never Smoker  . Smokeless tobacco: Never Used  Substance and Sexual Activity  . Alcohol use: No    Alcohol/week: 0.0 standard drinks  . Drug use: No  . Sexual activity: Never  Other Topics Concern  . Not on file  Social History Narrative  . Not on file   Social Determinants of Health   Financial Resource Strain:   . Difficulty of Paying Living Expenses:   Food Insecurity:   . Worried About Charity fundraiser in the Last Year:   . Arboriculturist in the Last Year:   Transportation Needs:   . Film/video editor (Medical):   Marland Kitchen Lack of Transportation (Non-Medical):   Physical Activity:   . Days of Exercise per Week:   . Minutes of Exercise per Session:   Stress:   . Feeling of Stress :   Social Connections: Unknown  . Frequency of Communication with Friends and Family: More than three times a week  . Frequency of Social Gatherings with Friends and Family: Not on file  . Attends Religious Services: Not on file  . Active Member of Clubs or Organizations: Not on file  . Attends Archivist Meetings: Not on file  . Marital Status: Not on file    Outpatient Encounter Medications as of 07/13/2019  Medication Sig  . brimonidine-timolol (COMBIGAN) 0.2-0.5 % ophthalmic solution Place 1 drop into the left eye 2 (two) times daily.   . calcitRIOL (ROCALTROL) 0.25 MCG capsule Take 0.25 mcg by mouth daily.   . dorzolamide (TRUSOPT) 2 % ophthalmic solution Place 1 drop into the left eye 2 (two) times daily.  . fluticasone (FLONASE) 50 MCG/ACT nasal spray Place 2 sprays into both nostrils daily as needed for allergies.   Marland Kitchen gabapentin (NEURONTIN) 300 MG capsule TAKE 2 CAPSULES BY MOUTH FOUR TIMES DAILY (Patient taking differently: Take 300-600 mg by mouth See admin instructions. 300 mg twice daily, 600 mg at bedtime)  . gentamicin ointment (GARAMYCIN) 0.1 % PLEASE SEE ATTACHED FOR DETAILED DIRECTIONS  . ipratropium (ATROVENT)  0.06 % nasal spray PLACE 1 SPRAY INTO BOTH NOSTRILS DAILY AS NEEDED FOR RHINITIS.  Marland Kitchen latanoprost (XALATAN) 0.005 % ophthalmic solution Place 1 drop into both eyes at bedtime.   Marland Kitchen levothyroxine (SYNTHROID) 112 MCG tablet Take 112 mcg by mouth daily before breakfast.  . losartan (COZAAR) 25 MG tablet Take 25 mg by mouth daily.   . metoprolol tartrate (LOPRESSOR) 50 MG tablet Take 1.5 tablets (75 mg total) by mouth 2 (two) times daily.  . Multiple Vitamin (MULTI-VITAMIN) tablet Take 1 tablet by mouth at bedtime.   . pantoprazole (PROTONIX) 20 MG tablet TAKE 1 TABLET BY MOUTH EVERY DAY  . torsemide (DEMADEX) 20 MG tablet Take 1 tablet (  20 mg total) by mouth daily.  . traMADol (ULTRAM) 50 MG tablet Take 1 tablet (50 mg total) by mouth daily as needed (for pain.).  Marland Kitchen ursodiol (ACTIGALL) 300 MG capsule Take 300-600 mg by mouth See admin instructions. Take 2 capsules (600 mg) daily in the morning   & 1 capsule (300 mg) at night.  Alveda Reasons 15 MG TABS tablet TAKE 1 TABLET (15 MG TOTAL) BY MOUTH DAILY WITH SUPPER.  . [DISCONTINUED] cefdinir (OMNICEF) 300 MG capsule Take 300 mg by mouth 2 (two) times daily.  . [DISCONTINUED] traMADol (ULTRAM) 50 MG tablet Take 1 tablet (50 mg total) by mouth daily as needed (for pain.).   No facility-administered encounter medications on file as of 07/13/2019.    Review of Systems  Constitutional: Negative for appetite change and unexpected weight change.  HENT: Negative for congestion and sinus pressure.   Respiratory: Positive for shortness of breath. Negative for cough and chest tightness.   Cardiovascular: Negative for chest pain, palpitations and leg swelling.  Gastrointestinal: Negative for abdominal pain, diarrhea, nausea and vomiting.  Genitourinary: Negative for difficulty urinating and dysuria.  Musculoskeletal: Negative for joint swelling and myalgias.  Skin: Negative for color change and rash.  Neurological: Negative for dizziness, light-headedness and  headaches.  Psychiatric/Behavioral: Negative for agitation and dysphoric mood.       Objective:    Physical Exam Vitals reviewed.  Constitutional:      General: She is not in acute distress.    Appearance: Normal appearance.  HENT:     Head: Normocephalic and atraumatic.     Right Ear: External ear normal.     Left Ear: External ear normal.  Eyes:     General: No scleral icterus.       Right eye: No discharge.        Left eye: No discharge.     Conjunctiva/sclera: Conjunctivae normal.  Neck:     Thyroid: No thyromegaly.  Cardiovascular:     Rate and Rhythm: Normal rate and regular rhythm.  Pulmonary:     Effort: No respiratory distress.     Breath sounds: Normal breath sounds. No wheezing.  Abdominal:     General: Bowel sounds are normal.     Palpations: Abdomen is soft.     Tenderness: There is no abdominal tenderness.  Musculoskeletal:        General: No swelling or tenderness.     Cervical back: Neck supple. No tenderness.  Lymphadenopathy:     Cervical: No cervical adenopathy.  Skin:    Findings: No erythema or rash.  Neurological:     Mental Status: She is alert.  Psychiatric:        Mood and Affect: Mood normal.        Behavior: Behavior normal.     BP 128/72   Pulse 87   Temp (!) 96.8 F (36 C)   Resp 16   Ht 5\' 3"  (1.6 m)   Wt 139 lb (63 kg)   SpO2 98%   BMI 24.62 kg/m  Wt Readings from Last 3 Encounters:  07/13/19 139 lb (63 kg)  07/09/19 139 lb (63 kg)  06/02/19 142 lb 6.4 oz (64.6 kg)     Lab Results  Component Value Date   WBC 6.5 07/07/2019   HGB 12.3 07/07/2019   HCT 38.9 07/07/2019   PLT 232 07/07/2019   GLUCOSE 102 (H) 07/13/2019   CHOL 126 07/08/2019   TRIG 124.0 07/08/2019   HDL 44.20  07/08/2019   LDLDIRECT 143.5 12/17/2012   LDLCALC 57 07/08/2019   ALT 18 07/08/2019   AST 15 07/08/2019   NA 136 07/13/2019   K 4.7 07/13/2019   CL 103 07/13/2019   CREATININE 2.05 (H) 07/13/2019   BUN 59 (H) 07/13/2019   CO2 25  07/13/2019   TSH 2.00 02/12/2019   INR 1.63 12/29/2017   HGBA1C 5.6 01/27/2018    US Abdomen Complete  Result Date: 06/23/2019 CLINICAL DATA:  Hepatic cirrhosis. EXAM: ABDOMEN ULTRASOUND COMPLETE COMPARISON:  September 16, 2018.  June 17, 2018. FINDINGS: Gallbladder: Status post cholecystectomy. Common bile duct: Diameter: 12 mm which is most likely due to post cholecystectomy status. Liver: Heterogeneous echotexture of hepatic parenchyma is noted with slightly nodular contours suggesting hepatic cirrhosis. No definite focal sonographic hepatic abnormality is noted. Portal vein is patent on color Doppler imaging with normal direction of blood flow towards the liver. IVC: No abnormality visualized. Pancreas: Not visualized due to overlying bowel gas. Spleen: Size and appearance within normal limits. Right Kidney: Length: 9.6 cm. 4.6 cm simple cyst is seen arising from upper pole. Echogenicity within normal limits. No mass or hydronephrosis visualized. Left Kidney: Length: 8.4 cm. Echogenicity within normal limits. No mass or hydronephrosis visualized. Abdominal aorta: No aneurysm visualized. Other findings: None. IMPRESSION: Findings consistent with hepatic cirrhosis. No definite focal sonographic hepatic abnormality is noted. Status post cholecystectomy. 4.6 cm simple right renal cyst. Electronically Signed   By: Marijo Conception M.D.   On: 06/23/2019 15:59       Assessment & Plan:   Problem List Items Addressed This Visit    Autoimmune hepatitis ([place])    Followed by GI.  Follow liver function tests.        Chronic diastolic heart failure ([place])    Followed by cardiology.  No evidence of volume overload today on exam. Weight stable.  On torsemide daily.  With sob with exertion.  EKG as outlined.  Check cxr.  F/u with cardiology.       CKD (chronic kidney disease) stage 4, GFR 15-29 ml/min (HCC)    Worsening renal function.  Elevated BUN/CR.  On torsemide daily.  Will change to qod for next four days  and then resume daily.  Will recheck metabolic panel within one week.  Recent abdominal ultrasound revealed no hydronephrosis or acute abnormality.  Avoid antiinflammatories.  With f/u labs, will check SPEP and UPEP.  Information and labs forwarded to nephrology.  They will arrange f/u appt for pt.        Relevant Orders   Basic metabolic panel (Completed)   GERD (gastroesophageal reflux disease)    Upper symptoms controlled on protonix.  Follow.       Hypercholesteremia    Unable to take statin medication.  Follow lipid panel.       Hypothyroidism    On thyroid replacement.  Follow tsh.       Iron deficiency anemia    Has seen hematology.  Follow cbc and iron studies.        Persistent atrial fibrillation (HCC)    On xarelto and metoprolol.  Rated controlled.  Followed by cardiology. Stable.       Primary biliary cholangitis ([place])    Being followed by GI.  Follow liver function tests.  Recent labs - immune to hepatitis A.  Needs Hep B vaccine.  Agreed - today.       Pulmonary hypertension, unspecified (HCC)    Elevated pulmonary artery pressure  noted on recent echo.  Being followed by cardiology.  With increased sob with exertion, f/u with cardiology as outlined.  Pending w/up, may need pulmonary evaluation.        SOB (shortness of breath) on exertion    Noticed increased sob with exertion.  EKG - afib with controlled ventricular rate.  No acute ischemic changes.  Sees cardiology.  Scheduled for f/u 08/2019.  Contact for earlier f/u.  Check cxr.        Relevant Orders   EKG 12-Lead   DG Chest 2 View (Completed)    Other Visit Diagnoses    Visit for screening mammogram    -  Primary   Relevant Orders   MM 3D SCREEN BREAST BILATERAL   Need for hepatitis B vaccination       Relevant Orders   Heplisav-B (HepB-CPG) Vaccine (Completed)      I spent more than 40 minutes with the patient and more than 50% of the time was spent in consultation regarding the above. Time  spent discussing her current symptoms and concerns.  Time also spent discussing further w/up and treatment plan.      Einar Pheasant, MD

## 2019-07-14 LAB — BASIC METABOLIC PANEL
BUN: 59 mg/dL — ABNORMAL HIGH (ref 6–23)
CO2: 25 mEq/L (ref 19–32)
Calcium: 12 mg/dL — ABNORMAL HIGH (ref 8.4–10.5)
Chloride: 103 mEq/L (ref 96–112)
Creatinine, Ser: 2.05 mg/dL — ABNORMAL HIGH (ref 0.40–1.20)
GFR: 22.79 mL/min — ABNORMAL LOW (ref >60.00)
Glucose, Bld: 102 mg/dL — ABNORMAL HIGH (ref 70–99)
Potassium: 4.7 mEq/L (ref 3.5–5.1)
Sodium: 136 mEq/L (ref 135–145)

## 2019-07-15 MED ORDER — TRAMADOL HCL 50 MG PO TABS: 50.0000 mg | ORAL_TABLET | Freq: Every day | ORAL | 0 refills | Status: DC | PRN

## 2019-07-16 ENCOUNTER — Other Ambulatory Visit: Payer: Self-pay | Admitting: Internal Medicine

## 2019-07-16 DIAGNOSIS — N184 Chronic kidney disease, stage 4 (severe): Secondary | ICD-10-CM

## 2019-07-16 NOTE — Progress Notes (Signed)
Order placed for f/u labs.  

## 2019-07-19 ENCOUNTER — Encounter: Payer: Self-pay | Admitting: Internal Medicine

## 2019-07-19 ENCOUNTER — Other Ambulatory Visit (INDEPENDENT_AMBULATORY_CARE_PROVIDER_SITE_OTHER): Payer: Medicare Other

## 2019-07-19 ENCOUNTER — Other Ambulatory Visit: Payer: Self-pay

## 2019-07-19 DIAGNOSIS — N184 Chronic kidney disease, stage 4 (severe): Secondary | ICD-10-CM | POA: Diagnosis not present

## 2019-07-19 LAB — BASIC METABOLIC PANEL
BUN: 57 mg/dL — ABNORMAL HIGH (ref 6–23)
CO2: 20 mEq/L (ref 19–32)
Calcium: 10.1 mg/dL (ref 8.4–10.5)
Chloride: 108 mEq/L (ref 96–112)
Creatinine, Ser: 1.58 mg/dL — ABNORMAL HIGH (ref 0.40–1.20)
GFR: 30.78 mL/min — ABNORMAL LOW (ref >60.00)
Glucose, Bld: 84 mg/dL (ref 70–99)
Potassium: 4.4 mEq/L (ref 3.5–5.1)
Sodium: 136 mEq/L (ref 135–145)

## 2019-07-19 NOTE — Assessment & Plan Note (Signed)
On xarelto and metoprolol.  Rated controlled.  Followed by cardiology. Stable.

## 2019-07-19 NOTE — Assessment & Plan Note (Signed)
Worsening renal function.  Elevated BUN/CR.  On torsemide daily.  Will change to qod for next four days and then resume daily.  Will recheck metabolic panel within one week.  Recent abdominal ultrasound revealed no hydronephrosis or acute abnormality.  Avoid antiinflammatories.  With f/u labs, will check SPEP and UPEP.  Information and labs forwarded to nephrology.  They will arrange f/u appt for pt.

## 2019-07-19 NOTE — Assessment & Plan Note (Signed)
Upper symptoms controlled on protonix.  Follow.  

## 2019-07-19 NOTE — Assessment & Plan Note (Signed)
Unable to take statin medication.  Follow lipid panel.  

## 2019-07-19 NOTE — Assessment & Plan Note (Signed)
Noticed increased sob with exertion.  EKG - afib with controlled ventricular rate.  No acute ischemic changes.  Sees cardiology.  Scheduled for f/u 08/2019.  Contact for earlier f/u.  Check cxr.

## 2019-07-19 NOTE — Assessment & Plan Note (Signed)
Has seen hematology.  Follow cbc and iron studies.  

## 2019-07-19 NOTE — Assessment & Plan Note (Addendum)
Being followed by GI.  Follow liver function tests.  Recent labs - immune to hepatitis A.  Needs Hep B vaccine.  Agreed - today.

## 2019-07-19 NOTE — Assessment & Plan Note (Signed)
Followed by cardiology.  No evidence of volume overload today on exam. Weight stable.  On torsemide daily.  With sob with exertion.  EKG as outlined.  Check cxr.  F/u with cardiology.

## 2019-07-19 NOTE — Assessment & Plan Note (Signed)
Followed by GI.  Follow liver function tests.  

## 2019-07-19 NOTE — Assessment & Plan Note (Signed)
On thyroid replacement.  Follow tsh.  

## 2019-07-19 NOTE — Assessment & Plan Note (Signed)
Elevated pulmonary artery pressure noted on recent echo.  Being followed by cardiology.  With increased sob with exertion, f/u with cardiology as outlined.  Pending w/up, may need pulmonary evaluation.

## 2019-07-21 DIAGNOSIS — J320 Chronic maxillary sinusitis: Secondary | ICD-10-CM | POA: Diagnosis not present

## 2019-07-21 DIAGNOSIS — K046 Periapical abscess with sinus: Secondary | ICD-10-CM | POA: Diagnosis not present

## 2019-07-21 DIAGNOSIS — J0100 Acute maxillary sinusitis, unspecified: Secondary | ICD-10-CM | POA: Diagnosis not present

## 2019-07-21 DIAGNOSIS — J324 Chronic pansinusitis: Secondary | ICD-10-CM | POA: Diagnosis not present

## 2019-07-21 LAB — PROTEIN ELECTROPHORESIS, SERUM
Albumin ELP: 3.5 g/dL — ABNORMAL LOW (ref 3.8–4.8)
Alpha 1: 0.4 g/dL — ABNORMAL HIGH (ref 0.2–0.3)
Alpha 2: 0.9 g/dL (ref 0.5–0.9)
Beta 2: 0.2 g/dL (ref 0.2–0.5)
Beta Globulin: 0.4 g/dL (ref 0.4–0.6)
Gamma Globulin: 0.8 g/dL (ref 0.8–1.7)
Total Protein: 6.2 g/dL (ref 6.1–8.1)

## 2019-07-21 LAB — PROTEIN ELECTROPHORESIS, URINE REFLEX
Albumin ELP, Urine: 23.6 %
Alpha-1-Globulin, U: 0.9 %
Alpha-2-Globulin, U: 5.2 %
Beta Globulin, U: 38.2 %
Gamma Globulin, U: 32.2 %
Protein, Ur: 5.8 mg/dL

## 2019-07-21 LAB — PARATHYROID HORMONE, INTACT (NO CA): PTH: 10 pg/mL — ABNORMAL LOW (ref 14–64)

## 2019-07-22 ENCOUNTER — Other Ambulatory Visit: Payer: Self-pay | Admitting: Cardiovascular Disease

## 2019-07-22 ENCOUNTER — Telehealth: Payer: Self-pay

## 2019-07-22 ENCOUNTER — Telehealth: Payer: Self-pay | Admitting: Internal Medicine

## 2019-07-22 NOTE — Telephone Encounter (Signed)
-----  Message from Anthonette Legato, MD sent at 07/15/2019  8:04 AM EDT ----- Regarding: RE: follow up - labs That does look worse.  I saw her last month.  Unclear why her calcium is high now.  We probably need to check spep and upep and recheck PTH.  PTH in March was 53.  We can see her back in the office soon.  We will call her.  Thanks for letting me know.  [place] ----- Message ----- From: Einar Pheasant, MD Sent: 07/15/2019   7:27 AM EDT To: Anthonette Legato, MD Subject: follow up - labs                               Ms Schnepp is a pt that you follow for her CKD.  She also has a history of afib and CHF.  She is on torsemide.  Recent labs revealed worsening kidney function.  She was here for a routine follow up and I rechecked - met b.  Her kidney function is continuing to decline and calcium is significantly elevated.  She does report noticing some sob with exertion.  CXR is clear.  I was going to decrease her torsemide.  The last time I decreased the dose (for a short period of time), she had problems with increased fluid retention.  I wanted you to be aware of the above and see if there is anything else you recommend.  Also, do you need to see her for an earlier appt.  Of note, she just had an abdominal ultrasound that revealed a 4.6cm cyst right kidney.  No other acute changes.    Thank you for your help Einar Pheasant

## 2019-07-22 NOTE — Telephone Encounter (Signed)
-----  Message from [name], MD sent at 07/21/2019  4:05 PM EDT ----- Regarding: RE: update It seems that her weight has been stable which is a good sign.  We should continue with torsemide 20 mg daily.  I think we continue with current management for now and monitor her symptoms closely.  Hopefully, she is not going into heart failure again.  I will have her follow-up with me in a month to reevaluate.  Lattie Haw,  Please schedule this patient to see me in 1 month.  Thanks   ----- Message ----- From: Einar Pheasant, MD Sent: 07/20/2019   6:51 AM EDT To: [name], MD Subject: update                                         I saw Ms Murdock for a regular follow up appointment.  She reported increased sob with exertion.  EKG - afib with controlled ventricular rate.  CXR clear.  No evidence of volume overload on exam.  Taking torsemide daily.  Met b revealed worsening renal function and significant elevation in her calcium.   I had her decrease her torsemide to qod for a few days.  Her f/u labs revealed her kidney function improved some.  Calcium level back to normal.  SPEP and UPEP pending.  At this time, she is to resume taking torsemide daily.  The last time I decreased her torsemide dose she started having problems with volume overload.  I still do not have a good explanation for her worsening sob.  I wanted to give you an update and see if you felt she needed an earlier follow up with you of if there is anything more you would like for me to do.  Also, I would like to get your opinion about her torsemide dosing.    Thank you for your help with her.   Einar Pheasant

## 2019-07-22 NOTE — Telephone Encounter (Signed)
-----  Message from Muhammad A Arida, MD sent at 07/21/2019  4:05 PM EDT ----- Regarding: RE: update It seems that her weight has been stable which is a good sign.  We should continue with torsemide 20 mg daily.  I think we continue with current management for now and monitor her symptoms closely.  Hopefully, she is not going into heart failure again.  I will have her follow-up with me in a month to reevaluate.  Ruth Roth,  Please schedule this patient to see me in 1 month.  Thanks   ----- Message ----- From: Scott, Charlene, MD Sent: 07/20/2019   6:51 AM EDT To: Muhammad A Arida, MD Subject: update                                         I saw Ruth Roth for a regular follow up appointment.  She reported increased sob with exertion.  EKG - afib with controlled ventricular rate.  CXR clear.  No evidence of volume overload on exam.  Taking torsemide daily.  Met b revealed worsening renal function and significant elevation in her calcium.   I had her decrease her torsemide to qod for a few days.  Her f/u labs revealed her kidney function improved some.  Calcium level back to normal.  SPEP and UPEP pending.  At this time, she is to resume taking torsemide daily.  The last time I decreased her torsemide dose she started having problems with volume overload.  I still do not have a good explanation for her worsening sob.  I wanted to give you an update and see if you felt she needed an earlier follow up with you of if there is anything more you would like for me to do.  Also, I would like to get your opinion about her torsemide dosing.    Thank you for your help with her.   Charlene Scott   

## 2019-08-05 DIAGNOSIS — E039 Hypothyroidism, unspecified: Secondary | ICD-10-CM | POA: Diagnosis not present

## 2019-08-14 ENCOUNTER — Other Ambulatory Visit: Payer: Self-pay | Admitting: Internal Medicine

## 2019-08-31 ENCOUNTER — Ambulatory Visit (INDEPENDENT_AMBULATORY_CARE_PROVIDER_SITE_OTHER): Payer: Medicare Other | Admitting: Internal Medicine

## 2019-08-31 ENCOUNTER — Other Ambulatory Visit: Payer: Self-pay

## 2019-08-31 DIAGNOSIS — M810 Age-related osteoporosis without current pathological fracture: Secondary | ICD-10-CM

## 2019-08-31 DIAGNOSIS — E063 Autoimmune thyroiditis: Secondary | ICD-10-CM

## 2019-08-31 DIAGNOSIS — N183 Chronic kidney disease, stage 3 unspecified: Secondary | ICD-10-CM

## 2019-08-31 DIAGNOSIS — I340 Nonrheumatic mitral (valve) insufficiency: Secondary | ICD-10-CM

## 2019-08-31 DIAGNOSIS — I272 Pulmonary hypertension, unspecified: Secondary | ICD-10-CM | POA: Diagnosis not present

## 2019-08-31 DIAGNOSIS — K743 Primary biliary cirrhosis: Secondary | ICD-10-CM

## 2019-08-31 DIAGNOSIS — K219 Gastro-esophageal reflux disease without esophagitis: Secondary | ICD-10-CM

## 2019-08-31 DIAGNOSIS — I5032 Chronic diastolic (congestive) heart failure: Secondary | ICD-10-CM

## 2019-08-31 DIAGNOSIS — E7800 Pure hypercholesterolemia, unspecified: Secondary | ICD-10-CM

## 2019-08-31 DIAGNOSIS — K754 Autoimmune hepatitis: Secondary | ICD-10-CM

## 2019-08-31 DIAGNOSIS — R0602 Shortness of breath: Secondary | ICD-10-CM | POA: Diagnosis not present

## 2019-08-31 DIAGNOSIS — I4819 Other persistent atrial fibrillation: Secondary | ICD-10-CM

## 2019-08-31 DIAGNOSIS — D500 Iron deficiency anemia secondary to blood loss (chronic): Secondary | ICD-10-CM

## 2019-08-31 NOTE — Progress Notes (Signed)
Patient ID: VALKYRIE GUARDIOLA, female   DOB: May 31, 1928, 86 y.o.   MRN: [id number]   Subjective:    Patient ID: [name], female    DOB: 20-Apr-1928, 86 y.o.   MRN: [id number]  HPI This visit occurred during the SARS-CoV-2 public health emergency.  Safety protocols were in place, including screening questions prior to the visit, additional usage of staff PPE, and extensive cleaning of exam room while observing appropriate contact time as indicated for disinfecting solutions.  Patient here for a scheduled follow up.  Has a history of recurrent afib with RVR and started on amiodarone.  Underwent DC -CV on 11/19/18.  On torsemide secondary to volume overload.  Developed recurrent afib and amio stopped.  Saw Dr Fletcher Anon 04/30/19 and metoprolol increased to 75mg  bid for better rate control.  Tolerating.  Breathing stable.  On abx.  Having tooth pulled.  Able to eat.  No chest pain.  No abdominal pain.  Occasional loose stool.  No persistent diarrhea.  Does report bilateral hip pain.  Worse with walking.  Tries to stay active.    Past Medical History:  Diagnosis Date  . (HFpEF) heart failure with preserved ejection fraction ([place])    a. 07/2017 Echo: EF 50-55%, no rwma, mild AS/AI, mod MR, mod dil LA, mod TR, PASP 58mmHg.  . Autoimmune hepatitis ([place])    followed by Dr [name]  . Fibrocystic breast disease   . Glaucoma   . Hypercholesterolemia   . Hypertension   . Hypothyroidism   . Inflammatory arthritis   . MI (myocardial infarction) ([place])   . Neuropathy   . Non-obstructive Coronary artery disease    a. 05/2014 NSTEMI/Cath: LM nl, LAD mild dzs, LCX 60ost hazy (? culprit), RCA mild dzs. EF 60% by echo-->Med Rx; b. 08/2017 Cath: LM 30ost, LAD min irregs, LCX small, 40ost/p, RCA large, min irregs, RPDA/RPL1 nl, EF 50-55%. 4+MR.  . Osteoarthritis   . Permanent atrial fibrillation (HCC)    a. CHA2DS2VASc = 6-->Xarelto.  . 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 Severe mitral regurgitation    a. 07/2017 Echo: Mod MR; b. 08/2017 Cath: 4+/Severe MR.   Past Surgical History:  Procedure Laterality Date  . ABDOMINAL HYSTERECTOMY  1963   partial, secondary to fibroids  . APPENDECTOMY    . Billroth    . CARDIAC CATHETERIZATION  05/12/14   [facility]  . CARDIOVERSION N/A 12/26/2016   Procedure: CARDIOVERSION;  Surgeon: [name], MD;  Location: [facility] ORS;  Service: Cardiovascular;  Laterality: N/A;  . CARDIOVERSION N/A 05/16/2017   Procedure: CARDIOVERSION;  Surgeon: [name], MD;  Location: [facility] ORS;  Service: Cardiovascular;  Laterality: N/A;  . CARDIOVERSION N/A 06/09/2017   Procedure: CARDIOVERSION;  Surgeon: [name], MD;  Location: [facility] ORS;  Service: Cardiovascular;  Laterality: N/A;  . CARDIOVERSION N/A 11/30/2018   Procedure: CARDIOVERSION;  Surgeon: [name], MD;  Location: [facility] ORS;  Service: Cardiovascular;  Laterality: N/A;  . CHOLECYSTECTOMY  1998  . COLONOSCOPY  2009  . RIGHT/LEFT HEART CATH AND CORONARY ANGIOGRAPHY Bilateral 08/21/2017   Procedure: RIGHT/LEFT HEART CATH AND CORONARY ANGIOGRAPHY;  Surgeon: [name], MD;  Location: [facility];  Service: Cardiovascular;  Laterality: Bilateral;  . TEE WITHOUT CARDIOVERSION N/A 08/25/2017   Procedure: TRANSESOPHAGEAL ECHOCARDIOGRAM (TEE);  Surgeon: Jolaine Artist, MD;  Location: [facility] ENDOSCOPY;  Service: Cardiovascular;  Laterality: N/A;  . UPPER GI  ENDOSCOPY  2004   Family History  Problem Relation Age of Onset  . Stroke Mother   . Esophageal cancer Brother        also had lung cancer  . Ovarian cancer Daughter   . Colon cancer Daughter   . Breast cancer Neg Hx    Social History   Socioeconomic History  . Marital status: Widowed    Spouse name: Not on file  . Number of children: 2  . Years of education: Not on file  . Highest education level: Not on file  Occupational History  . Occupation: retired  Tobacco Use  . Smoking  status: Never Smoker  . Smokeless tobacco: Never Used  Vaping Use  . Vaping Use: Never used  Substance and Sexual Activity  . Alcohol use: No    Alcohol/week: 0.0 standard drinks  . Drug use: No  . Sexual activity: Never  Other Topics Concern  . Not on file  Social History Narrative  . Not on file   Social Determinants of Health   Financial Resource Strain:   . Difficulty of Paying Living Expenses:   Food Insecurity:   . Worried About Charity fundraiser in the Last Year:   . Arboriculturist in the Last Year:   Transportation Needs:   . Film/video editor (Medical):   Marland Kitchen Lack of Transportation (Non-Medical):   Physical Activity:   . Days of Exercise per Week:   . Minutes of Exercise per Session:   Stress:   . Feeling of Stress :   Social Connections: Unknown  . Frequency of Communication with Friends and Family: More than three times a week  . Frequency of Social Gatherings with Friends and Family: Not on file  . Attends Religious Services: Not on file  . Active Member of Clubs or Organizations: Not on file  . Attends Archivist Meetings: Not on file  . Marital Status: Not on file    Outpatient Encounter Medications as of 08/31/2019  Medication Sig  . brimonidine-timolol (COMBIGAN) 0.2-0.5 % ophthalmic solution Place 1 drop into the left eye 2 (two) times daily.   . calcitRIOL (ROCALTROL) 0.25 MCG capsule Take 0.25 mcg by mouth daily.   . dorzolamide (TRUSOPT) 2 % ophthalmic solution Place 1 drop into the left eye 2 (two) times daily.  . fluticasone (FLONASE) 50 MCG/ACT nasal spray Place 2 sprays into both nostrils daily as needed for allergies.   Marland Kitchen gabapentin (NEURONTIN) 300 MG capsule TAKE 2 CAPSULES BY MOUTH FOUR TIMES DAILY (Patient taking differently: Take 300-600 mg by mouth See admin instructions. 300 mg twice daily, 600 mg at bedtime)  . gentamicin ointment (GARAMYCIN) 0.1 % PLEASE SEE ATTACHED FOR DETAILED DIRECTIONS  . ipratropium (ATROVENT) 0.06 %  nasal spray PLACE 1 SPRAY INTO BOTH NOSTRILS DAILY AS NEEDED FOR RHINITIS.  Marland Kitchen latanoprost (XALATAN) 0.005 % ophthalmic solution Place 1 drop into both eyes at bedtime.   Marland Kitchen levothyroxine (SYNTHROID) 112 MCG tablet Take 112 mcg by mouth daily before breakfast.  . losartan (COZAAR) 25 MG tablet Take 25 mg by mouth daily.   . metoprolol tartrate (LOPRESSOR) 50 MG tablet Take 1.5 tablets (75 mg total) by mouth 2 (two) times daily.  . Multiple Vitamin (MULTI-VITAMIN) tablet Take 1 tablet by mouth at bedtime.   . pantoprazole (PROTONIX) 20 MG tablet TAKE 1 TABLET BY MOUTH EVERY DAY  . torsemide (DEMADEX) 20 MG tablet TAKE 1 TABLET BY MOUTH EVERY DAY  . traMADol (  ULTRAM) 50 MG tablet Take 1 tablet (50 mg total) by mouth daily as needed (for pain.).  Marland Kitchen ursodiol (ACTIGALL) 300 MG capsule Take 300-600 mg by mouth See admin instructions. Take 2 capsules (600 mg) daily in the morning   & 1 capsule (300 mg) at night.  Alveda Reasons 15 MG TABS tablet TAKE 1 TABLET (15 MG TOTAL) BY MOUTH DAILY WITH SUPPER.   No facility-administered encounter medications on file as of 08/31/2019.    Review of Systems  Constitutional: Negative for appetite change and unexpected weight change.  HENT: Negative for congestion and sinus pressure.   Respiratory: Negative for cough and chest tightness.        Breathing stable.    Cardiovascular: Negative for chest pain and palpitations.  Gastrointestinal: Negative for abdominal pain, nausea and vomiting.       Occasional loose stool.   Genitourinary: Negative for difficulty urinating and dysuria.  Musculoskeletal: Negative for joint swelling and myalgias.  Skin: Negative for color change and rash.  Neurological: Negative for dizziness, light-headedness and headaches.  Psychiatric/Behavioral: Negative for agitation and dysphoric mood.       Objective:    Physical Exam Constitutional:      General: She is not in acute distress.    Appearance: Normal appearance.  HENT:      Head: Normocephalic and atraumatic.     Right Ear: External ear normal.     Left Ear: External ear normal.  Eyes:     General:        Right eye: No discharge.        Left eye: No discharge.     Conjunctiva/sclera: Conjunctivae normal.  Neck:     Thyroid: No thyromegaly.  Cardiovascular:     Rate and Rhythm: Normal rate.     Comments: Irregular rhythm - rate controlled.  Pulmonary:     Effort: No respiratory distress.     Breath sounds: Normal breath sounds. No wheezing.  Abdominal:     General: Bowel sounds are normal.     Palpations: Abdomen is soft.     Tenderness: There is no abdominal tenderness.  Musculoskeletal:        General: No swelling or tenderness.     Cervical back: Neck supple. No tenderness.  Lymphadenopathy:     Cervical: No cervical adenopathy.  Skin:    Findings: No erythema or rash.  Neurological:     Mental Status: She is alert.  Psychiatric:        Mood and Affect: Mood normal.        Behavior: Behavior normal.     BP 114/66   Pulse (!) 58   Temp 98 F (36.7 C)   Resp 16   Ht 5\' 3"  (1.6 m)   Wt 139 lb 12.8 oz (63.4 kg)   SpO2 99%   BMI 24.76 kg/m  Wt Readings from Last 3 Encounters:  08/31/19 139 lb 12.8 oz (63.4 kg)  07/13/19 139 lb (63 kg)  07/09/19 139 lb (63 kg)     Lab Results  Component Value Date   WBC 6.5 07/07/2019   HGB 12.3 07/07/2019   HCT 38.9 07/07/2019   PLT 232 07/07/2019   GLUCOSE 84 07/19/2019   CHOL 126 07/08/2019   TRIG 124.0 07/08/2019   HDL 44.20 07/08/2019   LDLDIRECT 143.5 12/17/2012   LDLCALC 57 07/08/2019   ALT 18 07/08/2019   AST 15 07/08/2019   NA 136 07/19/2019   K 4.4 07/19/2019  CL 108 07/19/2019   CREATININE 1.58 (H) 07/19/2019   BUN 57 (H) 07/19/2019   CO2 20 07/19/2019   TSH 2.00 02/12/2019   INR 1.63 12/29/2017   HGBA1C 5.6 01/27/2018    US Abdomen Complete  Result Date: 06/23/2019 CLINICAL DATA:  Hepatic cirrhosis. EXAM: ABDOMEN ULTRASOUND COMPLETE COMPARISON:  September 16, 2018.  June 17, 2018. FINDINGS: Gallbladder: Status post cholecystectomy. Common bile duct: Diameter: 12 mm which is most likely due to post cholecystectomy status. Liver: Heterogeneous echotexture of hepatic parenchyma is noted with slightly nodular contours suggesting hepatic cirrhosis. No definite focal sonographic hepatic abnormality is noted. Portal vein is patent on color Doppler imaging with normal direction of blood flow towards the liver. IVC: No abnormality visualized. Pancreas: Not visualized due to overlying bowel gas. Spleen: Size and appearance within normal limits. Right Kidney: Length: 9.6 cm. 4.6 cm simple cyst is seen arising from upper pole. Echogenicity within normal limits. No mass or hydronephrosis visualized. Left Kidney: Length: 8.4 cm. Echogenicity within normal limits. No mass or hydronephrosis visualized. Abdominal aorta: No aneurysm visualized. Other findings: None. IMPRESSION: Findings consistent with hepatic cirrhosis. No definite focal sonographic hepatic abnormality is noted. Status post cholecystectomy. 4.6 cm simple right renal cyst. Electronically Signed   By: Marijo Conception M.D.   On: 06/23/2019 15:59       Assessment & Plan:   Problem List Items Addressed This Visit    Autoimmune hepatitis ([place])    Followed by GI.  Follow liver function tests.        Chronic diastolic heart failure ([place])    Followed by cardiology.  No evidence of volume overload on exam today.  On torsemide daily.  Follow metabolic panel.        CKD (chronic kidney disease) stage 3, GFR 30-59 ml/min    Recent GFR 30.  Improved.  Avoid antiinflammatories.  Follow metabolic panel.        GERD (gastroesophageal reflux disease)    Upper symptoms controlled on protonix.  Follow.       Hypercholesteremia    Unable to take statin medication.  Follow lipid panel.       Iron deficiency anemia    Has seen hematology.  Follow cbc and iron studies.        Osteoporosis    On prolia.  Due bone density  11/2019.        Persistent atrial fibrillation (HCC)    On xarelto and metoprolol for rate control.  Follow.  Stable.  Continue f/u with cardiology.       Primary biliary cholangitis ([place])    Being followed by GI.  Follow liver function tests.        Pulmonary hypertension, unspecified (HCC)    Elevated pulmonary artery pressure noted on recent echo.  Followed by cardiology.  Continue torsemide.       Severe mitral regurgitation    On torsemide daily.  No evidence of volume overload on exam.  Breathing stable.        SOB (shortness of breath) on exertion    On metoprolol 75mg  bid for rate control.  Taking torsemide daily.  No evidence of volume overload on exam.  Follow.        Thyroiditis, lymphocytic    Has been followed by Dr Harlow Asa.  Evaluated 08/05/19.  Stable.  Recommended continuing synthroid 158mcg q day.  Recommended f/u in one year.            Einar Pheasant,  MD

## 2019-09-05 ENCOUNTER — Encounter: Payer: Self-pay | Admitting: Internal Medicine

## 2019-09-05 NOTE — Assessment & Plan Note (Signed)
On prolia.  Due bone density 11/2019.

## 2019-09-05 NOTE — Assessment & Plan Note (Signed)
On torsemide daily.  No evidence of volume overload on exam.  Breathing stable.

## 2019-09-05 NOTE — Assessment & Plan Note (Signed)
Has been followed by Dr Harlow Asa.  Evaluated 08/05/19.  Stable.  Recommended continuing synthroid 146mcg q day.  Recommended f/u in one year.

## 2019-09-05 NOTE — Assessment & Plan Note (Signed)
Followed by cardiology.  No evidence of volume overload on exam today.  On torsemide daily.  Follow metabolic panel.

## 2019-09-05 NOTE — Assessment & Plan Note (Signed)
Followed by GI.  Follow liver function tests.  

## 2019-09-05 NOTE — Assessment & Plan Note (Signed)
Unable to take statin medication.  Follow lipid panel.  

## 2019-09-05 NOTE — Assessment & Plan Note (Signed)
Recent GFR 30.  Improved.  Avoid antiinflammatories.  Follow metabolic panel.

## 2019-09-05 NOTE — Assessment & Plan Note (Signed)
Upper symptoms controlled on protonix.  Follow.  

## 2019-09-05 NOTE — Assessment & Plan Note (Signed)
Elevated pulmonary artery pressure noted on recent echo.  Followed by cardiology.  Continue torsemide.

## 2019-09-05 NOTE — Assessment & Plan Note (Signed)
Has seen hematology.  Follow cbc and iron studies.  

## 2019-09-05 NOTE — Assessment & Plan Note (Signed)
On metoprolol 75mg  bid for rate control.  Taking torsemide daily.  No evidence of volume overload on exam.  Follow.

## 2019-09-05 NOTE — Assessment & Plan Note (Signed)
On xarelto and metoprolol for rate control.  Follow.  Stable.  Continue f/u with cardiology.

## 2019-09-05 NOTE — Assessment & Plan Note (Signed)
Being followed by GI.  Follow liver function tests.

## 2019-09-07 ENCOUNTER — Other Ambulatory Visit: Payer: Self-pay

## 2019-09-07 ENCOUNTER — Ambulatory Visit (INDEPENDENT_AMBULATORY_CARE_PROVIDER_SITE_OTHER): Payer: Medicare Other | Admitting: Cardiovascular Disease

## 2019-09-07 ENCOUNTER — Encounter: Payer: Self-pay | Admitting: Cardiovascular Disease

## 2019-09-07 VITALS — BP 90/66 | HR 102 | Ht 63.0 in | Wt 140.1 lb

## 2019-09-07 DIAGNOSIS — I4819 Other persistent atrial fibrillation: Secondary | ICD-10-CM

## 2019-09-07 DIAGNOSIS — I2510 Atherosclerotic heart disease of native coronary artery without angina pectoris: Secondary | ICD-10-CM | POA: Diagnosis not present

## 2019-09-07 DIAGNOSIS — I5032 Chronic diastolic (congestive) heart failure: Secondary | ICD-10-CM | POA: Diagnosis not present

## 2019-09-07 MED ORDER — METOPROLOL TARTRATE 50 MG PO TABS: 50.0000 mg | ORAL_TABLET | Freq: Two times a day (BID) | ORAL | 2 refills | Status: DC

## 2019-09-07 MED ORDER — METOPROLOL TARTRATE 25 MG PO TABS
25.0000 mg | ORAL_TABLET | Freq: Two times a day (BID) | ORAL | 2 refills | Status: DC
Start: 2019-09-07 — End: 2020-02-10

## 2019-09-07 NOTE — Patient Instructions (Signed)
Medication Instructions:  Your physician recommends that you continue on your current medications as directed. Please refer to the Current Medication list given to you today.  A refill for Metoprolol 75 mg twice daily (2) 50 mg and (2) 25 mg tablets twice daily has been sent to your pharmacy.  *If you need a refill on your cardiac medications before your next appointment, please call your pharmacy*   Lab Work: None ordered If you have labs (blood work) drawn today and your tests are completely normal, you will receive your results only by: Marland Kitchen MyChart Message (if you have MyChart) OR . A paper copy in the mail If you have any lab test that is abnormal or we need to change your treatment, we will call you to review the results.   Testing/Procedures: None ordered   Follow-Up: At [facility], you and your health needs are our priority.  As part of our continuing mission to provide you with exceptional heart care, we have created designated Provider Care Teams.  These Care Teams include your primary Cardiologist (physician) and Advanced Practice Providers (APPs -  Physician Assistants and Nurse Practitioners) who all work together to provide you with the care you need, when you need it.  We recommend signing up for the patient portal called "MyChart".  Sign up information is provided on this After Visit Summary.  MyChart is used to connect with patients for Virtual Visits (Telemedicine).  Patients are able to view lab/test results, encounter notes, upcoming appointments, etc.  Non-urgent messages can be sent to your provider as well.   To learn more about what you can do with MyChart, go to [email or web address].    Your next appointment:   6 month(s)  The format for your next appointment:   In Person  Provider:    You may see [name], MD or one of the following Advanced Practice Providers on your designated Care Team:    Murray Hodgkins, NP  Christell Faith, PA-C  Marrianne Mood, PA-C    Other Instructions N/A

## 2019-09-07 NOTE — Progress Notes (Signed)
Cardiology Office Note   Date:  09/07/2019   ID:  SYLVER VANTASSELL, DOB Mar 28, 1928, MRN [id number]  PCP:  Einar Pheasant, MD  Cardiologist:   [name], MD   Chief Complaint  Patient presents with  . office visit    Pt has swelling in hands. Meds verbally reviewed w/ pt.      History of Present Illness: Ruth Roth is a 86 y.o. female who presents for a follow-up visit regarding coronary artery disease, chronic diastolic heart failure, mitral regurgitation and persistent atrial fibrillation .  She has known history of hypertension, peptic ulcer disease, fibrocystic breast disease, inflammatory arthritis, autoimmune hepatitis and neuropathy.  She had a small non-ST elevation myocardial infarction in March 2016 with acute diastolic heart failure after a GI illness.  Echocardiogram showed normal LV systolic function. Cardiac catheterization showed showed 60% ostial left circumflex hazy stenosis which was possibly the culprit. Mild LAD/RCA disease. She was treated medically.   She had worsening heart failure 2019.  Right and left cardiac catheterization in June 2019 showed  showed mild nonobstructive coronary artery disease.  Ejection fraction was 50 to 55% with severe mitral regurgitation.  Right heart catheterization showed severely elevated filling pressures with moderate to severe pulmonary hypertension and severely reduced cardiac output. I transferred the patient to [facility] where she was effectively diuresed with milrinone with subsequent improvement in symptoms.  She had a transesophageal echocardiogram done which showed normal LV systolic function with only mild mitral regurgitation.  The mitral regurgitation was felt to be functional with subsequent improvement after diuresis. Atrial fibrillation is currently being treated with rate control due to symptomatic maintaining sinus rhythm.  She has been doing reasonably well with improvement in rate since metoprolol was increased to 75 mg  twice daily.  She reports stable exertional dyspnea with no dizziness.  No chest pain.   Past Medical History:  Diagnosis Date  . (HFpEF) heart failure with preserved ejection fraction ([place])    a. 07/2017 Echo: EF 50-55%, no rwma, mild AS/AI, mod MR, mod dil LA, mod TR, PASP 54mmHg.  . Autoimmune hepatitis ([place])    followed by Dr [name]  . Fibrocystic breast disease   . Glaucoma   . Hypercholesterolemia   . Hypertension   . Hypothyroidism   . Inflammatory arthritis   . MI (myocardial infarction) ([place])   . Neuropathy   . Non-obstructive Coronary artery disease    a. 05/2014 NSTEMI/Cath: LM nl, LAD mild dzs, LCX 60ost hazy (? culprit), RCA mild dzs. EF 60% by echo-->Med Rx; b. 08/2017 Cath: LM 30ost, LAD min irregs, LCX small, 40ost/p, RCA large, min irregs, RPDA/RPL1 nl, EF 50-55%. 4+MR.  . Osteoarthritis   . Permanent atrial fibrillation (HCC)    a. CHA2DS2VASc = 6-->Xarelto.  . 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 Severe mitral regurgitation    a. 07/2017 Echo: Mod MR; b. 08/2017 Cath: 4+/Severe MR.    Past Surgical History:  Procedure Laterality Date  . ABDOMINAL HYSTERECTOMY  1963   partial, secondary to fibroids  . APPENDECTOMY    . Billroth    . CARDIAC CATHETERIZATION  05/12/14   [facility]  . CARDIOVERSION N/A 12/26/2016   Procedure: CARDIOVERSION;  Surgeon: [name], MD;  Location: [facility] ORS;  Service: Cardiovascular;  Laterality: N/A;  . CARDIOVERSION N/A 05/16/2017   Procedure: CARDIOVERSION;  Surgeon: [name], MD;  Location: [facility] ORS;  Service: Cardiovascular;  Laterality: N/A;  . CARDIOVERSION N/A 06/09/2017   Procedure: CARDIOVERSION;  Surgeon: [name], MD;  Location: [facility] ORS;  Service: Cardiovascular;  Laterality: N/A;  . CARDIOVERSION N/A 11/30/2018   Procedure: CARDIOVERSION;  Surgeon: [name], MD;  Location: [facility] ORS;  Service: Cardiovascular;  Laterality: N/A;  .  CHOLECYSTECTOMY  1998  . COLONOSCOPY  2009  . RIGHT/LEFT HEART CATH AND CORONARY ANGIOGRAPHY Bilateral 08/21/2017   Procedure: RIGHT/LEFT HEART CATH AND CORONARY ANGIOGRAPHY;  Surgeon: [name], MD;  Location: [place] CV LAB;  Service: Cardiovascular;  Laterality: Bilateral;  . TEE WITHOUT CARDIOVERSION N/A 08/25/2017   Procedure: TRANSESOPHAGEAL ECHOCARDIOGRAM (TEE);  Surgeon: Jolaine Artist, MD;  Location: [facility] ENDOSCOPY;  Service: Cardiovascular;  Laterality: N/A;  . UPPER GI ENDOSCOPY  2004     Current Outpatient Medications  Medication Sig Dispense Refill  . brimonidine-timolol (COMBIGAN) 0.2-0.5 % ophthalmic solution Place 1 drop into the left eye 2 (two) times daily.     . calcitRIOL (ROCALTROL) 0.25 MCG capsule Take 0.25 mcg by mouth daily.     . dorzolamide (TRUSOPT) 2 % ophthalmic solution Place 1 drop into the left eye 2 (two) times daily.    . fluticasone (FLONASE) 50 MCG/ACT nasal spray Place 2 sprays into both nostrils daily as needed for allergies.     Marland Kitchen gabapentin (NEURONTIN) 300 MG capsule TAKE 2 CAPSULES BY MOUTH FOUR TIMES DAILY (Patient taking differently: Take 300-600 mg by mouth See admin instructions. 300 mg twice daily, 600 mg at bedtime) 720 capsule 0  . gentamicin ointment (GARAMYCIN) 0.1 % PLEASE SEE ATTACHED FOR DETAILED DIRECTIONS    . ipratropium (ATROVENT) 0.06 % nasal spray PLACE 1 SPRAY INTO BOTH NOSTRILS DAILY AS NEEDED FOR RHINITIS. 15 mL 1  . latanoprost (XALATAN) 0.005 % ophthalmic solution Place 1 drop into both eyes at bedtime.     Marland Kitchen levothyroxine (SYNTHROID) 112 MCG tablet Take 112 mcg by mouth daily before breakfast.    . losartan (COZAAR) 25 MG tablet Take 25 mg by mouth daily.     . Multiple Vitamin (MULTI-VITAMIN) tablet Take 1 tablet by mouth at bedtime.     . pantoprazole (PROTONIX) 20 MG tablet TAKE 1 TABLET BY MOUTH EVERY DAY 90 tablet 1  . torsemide (DEMADEX) 20 MG tablet TAKE 1 TABLET BY MOUTH EVERY DAY 90 tablet 3  . traMADol  (ULTRAM) 50 MG tablet Take 1 tablet (50 mg total) by mouth daily as needed (for pain.). 30 tablet 0  . ursodiol (ACTIGALL) 300 MG capsule Take 300-600 mg by mouth See admin instructions. Take 2 capsules (600 mg) daily in the morning   & 1 capsule (300 mg) at night.    Alveda Reasons 15 MG TABS tablet TAKE 1 TABLET (15 MG TOTAL) BY MOUTH DAILY WITH SUPPER. 30 tablet 6  . metoprolol tartrate (LOPRESSOR) 50 MG tablet Take 1.5 tablets (75 mg total) by mouth 2 (two) times daily. 90 tablet 5   No current facility-administered medications for this visit.    Allergies:   Statins, Haldol [haloperidol lactate], Librax [chlordiazepoxide-clidinium], Plavix [clopidogrel bisulfate], and Ramipril    Social History:  The patient  reports that she has never smoked. She has never used smokeless tobacco. She reports that she does not drink alcohol and does not use drugs.   Family History:  The patient's family history includes Colon cancer in her daughter; Esophageal cancer in her brother; Ovarian cancer in her daughter; Stroke in her mother.  ROS:  Please see the history of present illness.   Otherwise, review of systems are positive for none.   All other systems are reviewed and negative.    PHYSICAL EXAM: VS:  BP 90/66 (BP Location: Left Arm, Patient Position: Sitting, Cuff Size: Normal)   Pulse (!) 102   Ht 5\' 3"  (1.6 m)   Wt 140 lb 2 oz (63.6 kg)   SpO2 92%   BMI 24.82 kg/m  , BMI Body mass index is 24.82 kg/m. GEN: Well nourished, well developed, in no acute distress  HEENT: normal  Neck: No JVD, carotid bruits, or masses Cardiac: Irregularly irregular ; no rubs, or gallops,no edema . 1/ 6 systolic ejection murmur at the aortic area.  Mild bilateral leg edema Respiratory: Clear to auscultation GI: soft, nontender, nondistended, + BS MS: no deformity or atrophy  Skin: warm and dry, no rash Neuro:  Strength and sensation are intact Psych: euthymic mood, full affect   EKG:  EKG is ordered  today. The ekg ordered today demonstrates atrial fibrillation with ventricular rate of 102 bpm.   Recent Labs: 02/12/2019: TSH 2.00 06/02/2019: B Natriuretic Peptide 383.8 07/07/2019: Hemoglobin 12.3; Platelets 232 07/08/2019: ALT 18 07/19/2019: BUN 57; Creatinine, Ser 1.58; Potassium 4.4; Sodium 136    Lipid Panel    Component Value Date/Time   CHOL 126 07/08/2019 1028   TRIG 124.0 07/08/2019 1028   HDL 44.20 07/08/2019 1028   CHOLHDL 3 07/08/2019 1028   VLDL 24.8 07/08/2019 1028   LDLCALC 57 07/08/2019 1028   LDLDIRECT 143.5 12/17/2012 0955      Wt Readings from Last 3 Encounters:  09/07/19 140 lb 2 oz (63.6 kg)  08/31/19 139 lb 12.8 oz (63.4 kg)  07/13/19 139 lb (63 kg)       ASSESSMENT AND PLAN:  1.    Permanent atrial fibrillation: Ventricular rate improved with the increase of metoprolol tartrate to 75 mg twice daily.  Continue anticoagulation with Xarelto 15 mg daily  2. Chronic diastolic heart failure:  Her weight has been stable and she appears to be euvolemic on current dose of torsemide 20 mg daily.  3. Essential hypertension: Blood pressure is controlled on current medications.  I rechecked her blood pressure and it was 110/68.  4.  Functional mitral regurgitation: Severe MR was only in the setting of severe volume overload.  MR was only mild on most recent transesophageal echo.  5.  Coronary artery disease involving native coronary arteries without angina: Continue medical therapy.   Disposition:   FU with me in 6 months.  Signed,  [name], MD  09/07/2019 4:10 PM    [facility]

## 2019-09-09 ENCOUNTER — Encounter: Payer: Self-pay | Admitting: Nurse Practitioner

## 2019-09-09 ENCOUNTER — Ambulatory Visit (INDEPENDENT_AMBULATORY_CARE_PROVIDER_SITE_OTHER): Payer: Medicare Other | Admitting: Nurse Practitioner

## 2019-09-09 ENCOUNTER — Other Ambulatory Visit: Payer: Self-pay

## 2019-09-09 VITALS — BP 120/62 | HR 67 | Temp 97.7°F | Ht 63.0 in | Wt 141.0 lb

## 2019-09-09 DIAGNOSIS — M7989 Other specified soft tissue disorders: Secondary | ICD-10-CM

## 2019-09-09 DIAGNOSIS — M25551 Pain in right hip: Secondary | ICD-10-CM

## 2019-09-09 DIAGNOSIS — N183 Chronic kidney disease, stage 3 unspecified: Secondary | ICD-10-CM

## 2019-09-09 DIAGNOSIS — M25552 Pain in left hip: Secondary | ICD-10-CM

## 2019-09-09 DIAGNOSIS — M79642 Pain in left hand: Secondary | ICD-10-CM

## 2019-09-09 DIAGNOSIS — K743 Primary biliary cirrhosis: Secondary | ICD-10-CM

## 2019-09-09 DIAGNOSIS — M79641 Pain in right hand: Secondary | ICD-10-CM

## 2019-09-09 DIAGNOSIS — G8929 Other chronic pain: Secondary | ICD-10-CM

## 2019-09-09 DIAGNOSIS — Z7901 Long term (current) use of anticoagulants: Secondary | ICD-10-CM

## 2019-09-09 DIAGNOSIS — D500 Iron deficiency anemia secondary to blood loss (chronic): Secondary | ICD-10-CM

## 2019-09-09 NOTE — Patient Instructions (Addendum)
Please go to the lab today.   I have placed a referral into Dr. Cynda Familia in rheumatology.You may be developing inflammatory arthritis.   We will consider medication after reviewing your  lab work.   Heat helps your pain and may continue to use warm water.   Follow up with Dr. Nicki Reaper in 4 weeks.  Get help right away if:  Your hand becomes warm, red, or swollen.  Your hand is numb or tingling.  Your hand is extremely swollen or deformed.  Your hand or fingers turn white or blue.  You cannot move your hand, wrist, or fingers. Summary  Many things can cause hand pain.  Contact your health care provider if your pain does not get better after a few days of self care.  Minimize stress on your hands and wrists as much as possible.  Do not do activities that make your pain worse.

## 2019-09-09 NOTE — Progress Notes (Signed)
Established Patient Office Visit  Subjective:  [name], female    DOB: 1928-10-22  Age: 86 y.o. MRN: [id number]  CC:  Chief Complaint  Patient presents with  . Acute Visit    swollen hands and hip pain    HPI MICHAELANN GUNNOE is an 86 year old patient with history of autoimmune hepatitis, primary biliary cholangitis, stage IIIb chronic kidney disease, CHF, hypothyroidism, Afib on Xarelto presents for bilateral  hand swelling since Sunday. She felt fine when she went to bed Saturday and woke up with diffuse swelling in her bilateral  hands/fingers with stiffness. She could not get close to making a fist secondary to the swelling on Sunday. She could not use her hands . Over the week, this has improved and the swelling has diminished. She is finding soaking her hands in warm water helps decrease the stiffness and pain.  She has also been having left hip/buttock pain that radiates up her back.  This started Monday and is improving. Patient has seen Dr. Jefm Bryant last year for Prolia shots.  She would like to see him again regarding her arthritis swelling/pain.  No other joints are bothering her.  She has noted no fevers or chills.  No skin rash. No lower extremity edema.  She is taking Augmentin for recent dental work.  She has noted no jaundice, or right upper quadrant abdominal pain.  Past Medical History:  Diagnosis Date  . (HFpEF) heart failure with preserved ejection fraction ([place])    a. 07/2017 Echo: EF 50-55%, no rwma, mild AS/AI, mod MR, mod dil LA, mod TR, PASP 44mHg.  . [place]immune [place]    followed by Dr SGustavo Lah . Fibrocystic breast disease   . Glaucoma   . Hypercholesterolemia   . Hypertension   . Hypothyroidism   . Inflammatory arthritis   . [place]   . Neuropathy   . Non-obstructive Coronary artery disease    a. 05/2014 NSTEMI/Cath: LM nl, LAD mild dzs, LCX 60ost hazy (? culprit), RCA mild dzs. EF 60% by echo-->Med Rx; b.  08/2017 Cath: LM 30ost, LAD min irregs, LCX small, 40ost/p, RCA large, min irregs, RPDA/RPL1 nl, EF 50-55%. 4+MR.  . Osteoarthritis   . Permanent atrial fibrillation (HCC)    a. CHA2DS2VASc = 6-->Xarelto.  . 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 Severe mitral regurgitation    a. 07/2017 Echo: Mod MR; b. 08/2017 Cath: 4+/Severe MR.    Past Surgical History:  Procedure Laterality Date  . ABDOMINAL HYSTERECTOMY  1963   partial, secondary to fibroids  . APPENDECTOMY    . Billroth    . CARDIAC CATHETERIZATION  05/12/14   [facility]  . CARDIOVERSION N/A 12/26/2016   Procedure: CARDIOVERSION;  Surgeon: AWellington Hampshire MD;  Location: [facility] ORS;  Service: Cardiovascular;  Laterality: N/A;  . CARDIOVERSION N/A 05/16/2017   Procedure: CARDIOVERSION;  Surgeon: AWellington Hampshire MD;  Location: [facility] ORS;  Service: Cardiovascular;  Laterality: N/A;  . CARDIOVERSION N/A 06/09/2017   Procedure: CARDIOVERSION;  Surgeon: AWellington Hampshire MD;  Location: [facility] ORS;  Service: Cardiovascular;  Laterality: N/A;  . CARDIOVERSION N/A 11/30/2018   Procedure: CARDIOVERSION;  Surgeon: AWellington Hampshire MD;  Location: [facility] ORS;  Service: Cardiovascular;  Laterality: N/A;  . CHOLECYSTECTOMY  1998  . COLONOSCOPY  2009  . RIGHT/LEFT HEART CATH AND CORONARY ANGIOGRAPHY Bilateral 08/21/2017   Procedure: RIGHT/LEFT HEART CATH AND CORONARY  ANGIOGRAPHY;  Surgeon: [name], MD;  Location: [place] CV LAB;  Service: Cardiovascular;  Laterality: Bilateral;  . TEE WITHOUT CARDIOVERSION N/A 08/25/2017   Procedure: TRANSESOPHAGEAL ECHOCARDIOGRAM (TEE);  Surgeon: Jolaine Artist, MD;  Location: [facility] ENDOSCOPY;  Service: Cardiovascular;  Laterality: N/A;  . UPPER GI ENDOSCOPY  2004    Family History  Problem Relation Age of Onset  . Stroke Mother   . Esophageal cancer Brother        also had lung cancer  . Ovarian cancer Daughter   . Colon cancer Daughter   .  Breast cancer Neg Hx     Social History   Socioeconomic History  . Marital status: Widowed    Spouse name: Not on file  . Number of children: 2  . Years of education: Not on file  . Highest education level: Not on file  Occupational History  . Occupation: retired  Tobacco Use  . Smoking status: Never Smoker  . Smokeless tobacco: Never Used  Vaping Use  . Vaping Use: Never used  Substance and Sexual Activity  . Alcohol use: No    Alcohol/week: 0.0 standard drinks  . Drug use: No  . Sexual activity: Never  Other Topics Concern  . Not on file  Social History Narrative  . Not on file   Social Determinants of Health   Financial Resource Strain:   . Difficulty of Paying Living Expenses:   Food Insecurity:   . Worried About Charity fundraiser in the Last Year:   . Arboriculturist in the Last Year:   Transportation Needs:   . Film/video editor (Medical):   Marland Kitchen Lack of Transportation (Non-Medical):   Physical Activity:   . Days of Exercise per Week:   . Minutes of Exercise per Session:   Stress:   . Feeling of Stress :   Social Connections: Unknown  . Frequency of Communication with Friends and Family: More than three times a week  . Frequency of Social Gatherings with Friends and Family: Not on file  . Attends Religious Services: Not on file  . Active Member of Clubs or Organizations: Not on file  . Attends Archivist Meetings: Not on file  . Marital Status: Not on file  Intimate Partner Violence: Not At Risk  . Fear of Current or Ex-Partner: No  . Emotionally Abused: No  . Physically Abused: No  . Sexually Abused: No    Outpatient Medications Prior to Visit  Medication Sig Dispense Refill  . calcitRIOL (ROCALTROL) 0.25 MCG capsule Take 0.25 mcg by mouth daily.     . dorzolamide (TRUSOPT) 2 % ophthalmic solution Place 1 drop into the left eye 2 (two) times daily.    . fluticasone (FLONASE) 50 MCG/ACT nasal spray Place 2 sprays into both nostrils  daily as needed for allergies.     Marland Kitchen gabapentin (NEURONTIN) 300 MG capsule TAKE 2 CAPSULES BY MOUTH FOUR TIMES DAILY (Patient taking differently: Take 300-600 mg by mouth See admin instructions. 300 mg twice daily, 600 mg at bedtime) 720 capsule 0  . ipratropium (ATROVENT) 0.06 % nasal spray PLACE 1 SPRAY INTO BOTH NOSTRILS DAILY AS NEEDED FOR RHINITIS. 15 mL 1  . latanoprost (XALATAN) 0.005 % ophthalmic solution Place 1 drop into both eyes at bedtime.     Marland Kitchen levothyroxine (SYNTHROID) 112 MCG tablet Take 112 mcg by mouth daily before breakfast.    . losartan (COZAAR) 25 MG tablet Take 25 mg by  mouth daily.     . metoprolol tartrate (LOPRESSOR) 25 MG tablet Take 1 tablet (25 mg total) by mouth 2 (two) times daily. 180 tablet 2  . metoprolol tartrate (LOPRESSOR) 50 MG tablet Take 1 tablet (50 mg total) by mouth 2 (two) times daily. 180 tablet 2  . Multiple Vitamin (MULTI-VITAMIN) tablet Take 1 tablet by mouth at bedtime.     . pantoprazole (PROTONIX) 20 MG tablet TAKE 1 TABLET BY MOUTH EVERY DAY 90 tablet 1  . torsemide (DEMADEX) 20 MG tablet TAKE 1 TABLET BY MOUTH EVERY DAY 90 tablet 3  . traMADol (ULTRAM) 50 MG tablet Take 1 tablet (50 mg total) by mouth daily as needed (for pain.). 30 tablet 0  . ursodiol (ACTIGALL) 300 MG capsule Take 300-600 mg by mouth See admin instructions. Take 2 capsules (600 mg) daily in the morning   & 1 capsule (300 mg) at night.    Alveda Reasons 15 MG TABS tablet TAKE 1 TABLET (15 MG TOTAL) BY MOUTH DAILY WITH SUPPER. 30 tablet 6  . brimonidine-timolol (COMBIGAN) 0.2-0.5 % ophthalmic solution Place 1 drop into the left eye 2 (two) times daily.  (Patient not taking: Reported on 09/09/2019)    . gentamicin ointment (GARAMYCIN) 0.1 % PLEASE SEE ATTACHED FOR DETAILED DIRECTIONS (Patient not taking: Reported on 09/09/2019)     No facility-administered medications prior to visit.    Allergies  Allergen Reactions  . Statins Other (See Comments)    Affect her liver, Caused liver  problems   . Haldol [Haloperidol Lactate] Rash  . Librax [Chlordiazepoxide-Clidinium] Rash  . Plavix [Clopidogrel Bisulfate] Rash  . Ramipril Rash    ROS Review of Systems  Constitutional: Negative for chills and fever.  HENT:       She had a tooth pulled last Tues- one week ago- and has been taking  Augmentin x 10 days and still on it. No problems with her teeth/gums or sinuses.   Eyes: Negative.   Respiratory: Negative for cough and shortness of breath.   Cardiovascular: Negative for chest pain, palpitations and leg swelling.  Gastrointestinal: Negative.   Endocrine: Negative.   Genitourinary: Negative.   Musculoskeletal: Positive for arthralgias, back pain and joint swelling. Negative for gait problem, myalgias, neck pain and neck stiffness.  Skin: Negative.  Negative for rash.  Neurological: Negative for dizziness, weakness, light-headedness and headaches.  Hematological: Negative.   Psychiatric/Behavioral: Negative.       Objective:    Physical Exam Vitals reviewed.  Constitutional:      Appearance: Normal appearance.  HENT:     Head: Normocephalic.  Eyes:     Pupils: Pupils are equal, round, and reactive to light.  Cardiovascular:     Rate and Rhythm: Normal rate and regular rhythm.     Pulses: Normal pulses.     Heart sounds: Normal heart sounds.  Pulmonary:     Effort: Pulmonary effort is normal.     Breath sounds: Normal breath sounds.  Abdominal:     General: Bowel sounds are normal.     Palpations: Abdomen is soft.     Tenderness: There is no abdominal tenderness.  Musculoskeletal:        General: Normal range of motion.     Cervical back: Neck supple.     Comments: Rt hand swelling of the  2nd and 3rd finger- decreased flexion  secondary to pain. No erythema, warmth, but positive tenderness 2 and 3 MCP.   Left hand: thumb is the  most painful- better ROM hand -full ROM but tender thumb MCP.   Hip no tender bursa, normal ROM, and gait.   Skin:     General: Skin is warm and dry.     Findings: No bruising or erythema.  Neurological:     General: No focal deficit present.     Mental Status: She is alert and oriented to person, place, and time.  Psychiatric:        Mood and Affect: Mood normal.        Behavior: Behavior normal.     BP 120/62 (BP Location: Left Arm, Patient Position: Sitting, Cuff Size: Normal)   Pulse 67   Temp 97.7 F (36.5 C) (Oral)   Ht _0  (1.6 m)   Wt 141 lb (64 kg)   SpO2 97%   BMI 24.98 kg/m  Wt Readings from Last 3 Encounters:  09/09/19 141 lb (64 kg)  09/07/19 140 lb 2 oz (63.6 kg)  08/31/19 139 lb 12.8 oz (63.4 kg)     There are no preventive care reminders to display for this patient.  There are no preventive care reminders to display for this patient.  Lab Results  Component Value Date   TSH 2.00 02/12/2019   Lab Results  Component Value Date   WBC 6.7 09/09/2019   HGB 11.3 (L) 09/09/2019   HCT 34.9 (L) 09/09/2019   MCV 101.5 (H) 09/09/2019   PLT 184.0 09/09/2019   Lab Results  Component Value Date   NA 134 (L) 09/09/2019   K 4.4 09/09/2019   CO2 17 (L) 09/09/2019   GLUCOSE 99 09/09/2019   BUN 63 (H) 09/09/2019   CREATININE 1.63 (H) 09/09/2019   BILITOT 0.4 09/09/2019   ALKPHOS 105 09/09/2019   AST 14 09/09/2019   ALT 16 09/09/2019   PROT 6.5 09/09/2019   ALBUMIN 3.9 09/09/2019   CALCIUM 9.1 09/09/2019   ANIONGAP 12 06/02/2019   GFR 29.69 (L) 09/09/2019   Lab Results  Component Value Date   CHOL 126 07/08/2019   Lab Results  Component Value Date   HDL 44.20 07/08/2019   Lab Results  Component Value Date   LDLCALC 57 07/08/2019   Lab Results  Component Value Date   TRIG 124.0 07/08/2019   Lab Results  Component Value Date   CHOLHDL 3 07/08/2019   Lab Results  Component Value Date   HGBA1C 5.6 01/27/2018      Assessment & Plan:   Problem List Items Addressed This Visit      Digestive   Primary biliary cholangitis (HCC)     Genitourinary    CKD (chronic kidney disease) stage 3, GFR 30-59 ml/min     Other   Chronic hip pain (Secondary area of Pain) (Bilateral) (R>L) (Chronic)   Chronic anticoagulation (Xarelto) (Chronic)   Iron deficiency anemia   Bilateral hand swelling   Relevant Orders   Ambulatory referral to Rheumatology   Sedimentation rate (Completed)   C-reactive protein (Completed)   Antinuclear Antib (ANA)   Rheumatoid factor (Completed)   Comp Met (CMET) (Completed)   CBC with Differential/Platelet (Completed)   Bilateral hand pain - Primary   Relevant Orders   Ambulatory referral to Rheumatology   Sedimentation rate (Completed)   C-reactive protein (Completed)   Antinuclear Antib (ANA)   Rheumatoid factor (Completed)   Comp Met (CMET) (Completed)   CBC with Differential/Platelet (Completed)      No orders of the defined types were placed in this encounter.  I have placed a referral into Dr. Cynda Familia in rheumatology.Consider new onset of  inflammatory arthritis. Heat helps her pain and may continue to use warm water.  The hand swelling and pain is diminishing over the week. Occasional Tylenol use. Cannot recommend NSAID's d/t CKD. Consider role of prednisone.   Follow up with Dr. Nicki Reaper in 4 weeks.  Get help right away if:  Your hand becomes warm, red, or swollen.  Your hand is numb or tingling.  Your hand is extremely swollen or deformed.  Your hand or fingers turn white or blue.  You cannot move your hand, wrist, or fingers. Summary  Many things can cause hand pain.  Contact your health care provider if your pain does not get better after a few days of self care.  Minimize stress on your hands and wrists as much as possible.  Do not do activities that make your pain worse.  Follow-up: Return in about 4 weeks (around 10/07/2019).    Denice Paradise, NP

## 2019-09-10 LAB — CBC WITH DIFFERENTIAL/PLATELET
Basophils Absolute: 0.1 10*3/uL (ref 0.0–0.1)
Basophils Relative: 1.2 % (ref 0.0–3.0)
Eosinophils Absolute: 0.1 10*3/uL (ref 0.0–0.7)
Eosinophils Relative: 0.8 % (ref 0.0–5.0)
HCT: 34.9 % — ABNORMAL LOW (ref 36.0–46.0)
Hemoglobin: 11.3 g/dL — ABNORMAL LOW (ref 12.0–15.0)
Lymphocytes Relative: 11.9 % — ABNORMAL LOW (ref 12.0–46.0)
Lymphs Abs: 0.8 10*3/uL (ref 0.7–4.0)
MCHC: 32.4 g/dL (ref 30.0–36.0)
MCV: 101.5 fl — ABNORMAL HIGH (ref 78.0–100.0)
Monocytes Absolute: 1.1 10*3/uL — ABNORMAL HIGH (ref 0.1–1.0)
Monocytes Relative: 16.5 % — ABNORMAL HIGH (ref 3.0–12.0)
Neutro Abs: 4.6 10*3/uL (ref 1.4–7.7)
Neutrophils Relative %: 69.6 % (ref 43.0–77.0)
Platelets: 184 10*3/uL (ref 150.0–400.0)
RBC: 3.44 Mil/uL — ABNORMAL LOW (ref 3.87–5.11)
RDW: 15.4 % (ref 11.5–15.5)
WBC: 6.7 10*3/uL (ref 4.0–10.5)

## 2019-09-10 LAB — COMPREHENSIVE METABOLIC PANEL
ALT: 16 U/L (ref 0–35)
AST: 14 U/L (ref 0–37)
Albumin: 3.9 g/dL (ref 3.5–5.2)
Alkaline Phosphatase: 105 U/L (ref 39–117)
BUN: 63 mg/dL — ABNORMAL HIGH (ref 6–23)
CO2: 17 mEq/L — ABNORMAL LOW (ref 19–32)
Calcium: 9.1 mg/dL (ref 8.4–10.5)
Chloride: 104 mEq/L (ref 96–112)
Creatinine, Ser: 1.63 mg/dL — ABNORMAL HIGH (ref 0.40–1.20)
GFR: 29.69 mL/min — ABNORMAL LOW (ref >60.00)
Glucose, Bld: 99 mg/dL (ref 70–99)
Potassium: 4.4 mEq/L (ref 3.5–5.1)
Sodium: 134 mEq/L — ABNORMAL LOW (ref 135–145)
Total Bilirubin: 0.4 mg/dL (ref 0.2–1.2)
Total Protein: 6.5 g/dL (ref 6.0–8.3)

## 2019-09-10 LAB — SEDIMENTATION RATE: Sed Rate: 93 mm/hr — ABNORMAL HIGH (ref 0–30)

## 2019-09-10 LAB — C-REACTIVE PROTEIN: CRP: 14.3 mg/dL (ref 0.5–20.0)

## 2019-09-13 ENCOUNTER — Encounter: Payer: Self-pay | Admitting: Nurse Practitioner

## 2019-09-14 ENCOUNTER — Telehealth: Payer: Self-pay

## 2019-09-14 DIAGNOSIS — N184 Chronic kidney disease, stage 4 (severe): Secondary | ICD-10-CM | POA: Diagnosis not present

## 2019-09-14 LAB — ANTI-NUCLEAR AB-TITER (ANA TITER)
ANA TITER: 1:80 {titer} — ABNORMAL HIGH
ANA Titer 1: 1:320 {titer} — ABNORMAL HIGH

## 2019-09-14 LAB — RHEUMATOID FACTOR: Rheumatoid fact SerPl-aCnc: <14 IU/mL (ref <14)

## 2019-09-14 LAB — ANA: Anti Nuclear Antibody (ANA): POSITIVE — AB

## 2019-09-14 NOTE — Telephone Encounter (Signed)
LMTCB for lab results and to see how patient is doing

## 2019-09-14 NOTE — Telephone Encounter (Signed)
Spoke with patient and she is finally much better; swelling has come down too. Patient states Rheum did call her Friday but they had no appts this week or next. They told her they would call her back once the doctor looked at her chart to see if she could be worked in. I advised patient to call them back to see when they may be able to see her.

## 2019-09-14 NOTE — Telephone Encounter (Signed)
I agree. Thank you.

## 2019-09-15 ENCOUNTER — Ambulatory Visit
Admission: RE | Admit: 2019-09-15 | Discharge: 2019-09-15 | Disposition: A | Discharge status: Home / Self Care | Payer: Medicare Other | Source: Ambulatory Visit | Attending: Internal Medicine | Admitting: Internal Medicine

## 2019-09-15 DIAGNOSIS — Z1231 Encounter for screening mammogram for malignant neoplasm of breast: Secondary | ICD-10-CM | POA: Diagnosis not present

## 2019-09-23 DIAGNOSIS — M810 Age-related osteoporosis without current pathological fracture: Secondary | ICD-10-CM | POA: Diagnosis not present

## 2019-09-27 DIAGNOSIS — M79641 Pain in right hand: Secondary | ICD-10-CM | POA: Diagnosis not present

## 2019-09-27 DIAGNOSIS — M19042 Primary osteoarthritis, left hand: Secondary | ICD-10-CM | POA: Diagnosis not present

## 2019-09-27 DIAGNOSIS — M79642 Pain in left hand: Secondary | ICD-10-CM | POA: Diagnosis not present

## 2019-09-27 DIAGNOSIS — M19041 Primary osteoarthritis, right hand: Secondary | ICD-10-CM | POA: Diagnosis not present

## 2019-09-27 DIAGNOSIS — M1990 Unspecified osteoarthritis, unspecified site: Secondary | ICD-10-CM | POA: Diagnosis not present

## 2019-09-27 DIAGNOSIS — M85842 Other specified disorders of bone density and structure, left hand: Secondary | ICD-10-CM | POA: Diagnosis not present

## 2019-10-11 DIAGNOSIS — M1990 Unspecified osteoarthritis, unspecified site: Secondary | ICD-10-CM | POA: Diagnosis not present

## 2019-10-11 DIAGNOSIS — M65331 Trigger finger, right middle finger: Secondary | ICD-10-CM | POA: Diagnosis not present

## 2019-10-11 DIAGNOSIS — Z79899 Other long term (current) drug therapy: Secondary | ICD-10-CM | POA: Diagnosis not present

## 2019-10-12 ENCOUNTER — Other Ambulatory Visit: Payer: Self-pay | Admitting: Internal Medicine

## 2019-10-15 ENCOUNTER — Ambulatory Visit: Payer: Medicare Other | Admitting: Internal Medicine

## 2019-10-26 ENCOUNTER — Other Ambulatory Visit: Payer: Self-pay

## 2019-10-26 ENCOUNTER — Ambulatory Visit (INDEPENDENT_AMBULATORY_CARE_PROVIDER_SITE_OTHER): Payer: Medicare Other | Admitting: Internal Medicine

## 2019-10-26 ENCOUNTER — Encounter: Payer: Self-pay | Admitting: Internal Medicine

## 2019-10-26 DIAGNOSIS — I272 Pulmonary hypertension, unspecified: Secondary | ICD-10-CM | POA: Diagnosis not present

## 2019-10-26 DIAGNOSIS — E039 Hypothyroidism, unspecified: Secondary | ICD-10-CM | POA: Diagnosis not present

## 2019-10-26 DIAGNOSIS — D500 Iron deficiency anemia secondary to blood loss (chronic): Secondary | ICD-10-CM | POA: Diagnosis not present

## 2019-10-26 DIAGNOSIS — I5032 Chronic diastolic (congestive) heart failure: Secondary | ICD-10-CM | POA: Diagnosis not present

## 2019-10-26 DIAGNOSIS — G6289 Other specified polyneuropathies: Secondary | ICD-10-CM | POA: Diagnosis not present

## 2019-10-26 DIAGNOSIS — K743 Primary biliary cirrhosis: Secondary | ICD-10-CM | POA: Diagnosis not present

## 2019-10-26 DIAGNOSIS — I4819 Other persistent atrial fibrillation: Secondary | ICD-10-CM | POA: Diagnosis not present

## 2019-10-26 DIAGNOSIS — E7800 Pure hypercholesterolemia, unspecified: Secondary | ICD-10-CM | POA: Diagnosis not present

## 2019-10-26 DIAGNOSIS — I2510 Atherosclerotic heart disease of native coronary artery without angina pectoris: Secondary | ICD-10-CM

## 2019-10-26 DIAGNOSIS — D509 Iron deficiency anemia, unspecified: Secondary | ICD-10-CM

## 2019-10-26 DIAGNOSIS — R0602 Shortness of breath: Secondary | ICD-10-CM | POA: Diagnosis not present

## 2019-10-26 DIAGNOSIS — M7989 Other specified soft tissue disorders: Secondary | ICD-10-CM

## 2019-10-26 DIAGNOSIS — I5031 Acute diastolic (congestive) heart failure: Secondary | ICD-10-CM

## 2019-10-26 DIAGNOSIS — N183 Chronic kidney disease, stage 3 unspecified: Secondary | ICD-10-CM | POA: Diagnosis not present

## 2019-10-26 DIAGNOSIS — K754 Autoimmune hepatitis: Secondary | ICD-10-CM

## 2019-10-26 MED ORDER — TRAMADOL HCL 50 MG PO TABS: 50.0000 mg | ORAL_TABLET | Freq: Two times a day (BID) | ORAL | 0 refills | Status: DC | PRN

## 2019-10-26 NOTE — Progress Notes (Signed)
[name], female   DOB: 1928/11/13, 86 y.o.   MRN: [id number]   Subjective:    [name], female    DOB: 02-Apr-1928, 86 y.o.   MRN: [id number]  HPI This visit occurred during the SARS-CoV-2 public health emergency.  Safety protocols were in place, including screening questions prior to the visit, additional usage of staff PPE, and extensive cleaning of exam room while observing appropriate contact time as indicated for disinfecting solutions.  Patient here for a scheduled follow up.  She reports she has been doing relatively well.  Recently evaluated by Dr Jefm Bryant - bilateral hand swelling.  Started on plaquenil.  Hands are better.  Taking tramadol and tylenol.  Saw nephrology 09/2019.  Stable.  Saw cardiology 09/07/19 - afib.  Continues on metoprolol and xarelto.  On torsemide.  No evidence of volume overload on exam.  Breathing stable.  No chest pain.  No acid reflux or abdominal pain reported.  Bowels stable.  Handling stress.    Past Medical History:  Diagnosis Date  . (HFpEF) heart failure with preserved ejection fraction ([place])    a. 07/2017 Echo: EF 50-55%, no rwma, mild AS/AI, mod MR, mod dil LA, mod TR, PASP 74mmHg.  . Autoimmune hepatitis ([place])    followed by Dr [name]  . Fibrocystic breast disease   . Glaucoma   . Hypercholesterolemia   . Hypertension   . Hypothyroidism   . Inflammatory arthritis   . MI (myocardial infarction) ([place])   . Neuropathy   . Non-obstructive Coronary artery disease    a. 05/2014 NSTEMI/Cath: LM nl, LAD mild dzs, LCX 60ost hazy (? culprit), RCA mild dzs. EF 60% by echo-->Med Rx; b. 08/2017 Cath: LM 30ost, LAD min irregs, LCX small, 40ost/p, RCA large, min irregs, RPDA/RPL1 nl, EF 50-55%. 4+MR.  . Osteoarthritis   . Permanent atrial fibrillation (HCC)    a. CHA2DS2VASc = 6-->Xarelto.  . 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 Severe mitral regurgitation    a. 07/2017  Echo: Mod MR; b. 08/2017 Cath: 4+/Severe MR.   Past Surgical History:  Procedure Laterality Date  . ABDOMINAL HYSTERECTOMY  1963   partial, secondary to fibroids  . APPENDECTOMY    . Billroth    . CARDIAC CATHETERIZATION  05/12/14   [facility]  . CARDIOVERSION N/A 12/26/2016   Procedure: CARDIOVERSION;  Surgeon: [name], MD;  Location: [facility] ORS;  Service: Cardiovascular;  Laterality: N/A;  . CARDIOVERSION N/A 05/16/2017   Procedure: CARDIOVERSION;  Surgeon: [name], MD;  Location: [facility] ORS;  Service: Cardiovascular;  Laterality: N/A;  . CARDIOVERSION N/A 06/09/2017   Procedure: CARDIOVERSION;  Surgeon: [name], MD;  Location: [facility] ORS;  Service: Cardiovascular;  Laterality: N/A;  . CARDIOVERSION N/A 11/30/2018   Procedure: CARDIOVERSION;  Surgeon: [name], MD;  Location: [facility] ORS;  Service: Cardiovascular;  Laterality: N/A;  . CHOLECYSTECTOMY  1998  . COLONOSCOPY  2009  . RIGHT/LEFT HEART CATH AND CORONARY ANGIOGRAPHY Bilateral 08/21/2017   Procedure: RIGHT/LEFT HEART CATH AND CORONARY ANGIOGRAPHY;  Surgeon: [name], MD;  Location: [place] [facility] LAB;  Service: Cardiovascular;  Laterality: Bilateral;  . TEE WITHOUT CARDIOVERSION N/A 08/25/2017   Procedure: TRANSESOPHAGEAL ECHOCARDIOGRAM (TEE);  Surgeon: Jolaine Artist, MD;  Location: [facility] ENDOSCOPY;  Service: Cardiovascular;  Laterality: N/A;  . UPPER GI ENDOSCOPY  2004   Family History  Problem Relation Age of Onset  .  Stroke Mother   . Esophageal cancer Brother        also had lung cancer  . Ovarian cancer Daughter   . Colon cancer Daughter   . Breast cancer Neg Hx    Social History   Socioeconomic History  . Marital status: Widowed    Spouse name: Not on file  . Number of children: 2  . Years of education: Not on file  . Highest education level: Not on file  Occupational History  . Occupation: retired  Tobacco Use  . Smoking status: Never Smoker  . Smokeless tobacco: Never Used    Vaping Use  . Vaping Use: Never used  Substance and Sexual Activity  . Alcohol use: No    Alcohol/week: 0.0 standard drinks  . Drug use: No  . Sexual activity: Never  Other Topics Concern  . Not on file  Social History Narrative  . Not on file   Social Determinants of Health   Financial Resource Strain:   . Difficulty of Paying Living Expenses: Not on file  Food Insecurity:   . Worried About Charity fundraiser in the Last Year: Not on file  . Ran Out of Food in the Last Year: Not on file  Transportation Needs:   . Lack of Transportation (Medical): Not on file  . Lack of Transportation (Non-Medical): Not on file  Physical Activity:   . Days of Exercise per Week: Not on file  . Minutes of Exercise per Session: Not on file  Stress:   . Feeling of Stress : Not on file  Social Connections: Unknown  . Frequency of Communication with Friends and Family: More than three times a week  . Frequency of Social Gatherings with Friends and Family: Not on file  . Attends Religious Services: Not on file  . Active Member of Clubs or Organizations: Not on file  . Attends Archivist Meetings: Not on file  . Marital Status: Not on file    Outpatient Encounter Medications as of 10/26/2019  Medication Sig  . calcitRIOL (ROCALTROL) 0.25 MCG capsule Take 0.25 mcg by mouth daily.   . dorzolamide (TRUSOPT) 2 % ophthalmic solution Place 1 drop into the left eye 2 (two) times daily.  . fluticasone (FLONASE) 50 MCG/ACT nasal spray Place 2 sprays into both nostrils daily as needed for allergies.   Marland Kitchen gabapentin (NEURONTIN) 300 MG capsule TAKE 2 CAPSULES BY MOUTH FOUR TIMES DAILY (Patient taking differently: Take 300-600 mg by mouth See admin instructions. 300 mg twice daily, 600 mg at bedtime)  . hydroxychloroquine (PLAQUENIL) 200 MG tablet Take 200 mg by mouth daily.  Marland Kitchen ipratropium (ATROVENT) 0.06 % nasal spray PLACE 1 SPRAY INTO BOTH NOSTRILS DAILY AS NEEDED FOR RHINITIS.  Marland Kitchen latanoprost  (XALATAN) 0.005 % ophthalmic solution Place 1 drop into both eyes at bedtime.   Marland Kitchen levothyroxine (SYNTHROID) 112 MCG tablet Take 112 mcg by mouth daily before breakfast.  . losartan (COZAAR) 25 MG tablet Take 25 mg by mouth daily.   . metoprolol tartrate (LOPRESSOR) 25 MG tablet Take 1 tablet (25 mg total) by mouth 2 (two) times daily.  . metoprolol tartrate (LOPRESSOR) 50 MG tablet Take 1 tablet (50 mg total) by mouth 2 (two) times daily.  . Multiple Vitamin (MULTI-VITAMIN) tablet Take 1 tablet by mouth at bedtime.   . pantoprazole (PROTONIX) 20 MG tablet TAKE 1 TABLET BY MOUTH EVERY DAY  . torsemide (DEMADEX) 20 MG tablet TAKE 1 TABLET BY MOUTH EVERY DAY  .  traMADol (ULTRAM) 50 MG tablet Take 1 tablet (50 mg total) by mouth 2 (two) times daily as needed (for pain.).  Marland Kitchen ursodiol (ACTIGALL) 300 MG capsule Take 300-600 mg by mouth See admin instructions. Take 2 capsules (600 mg) daily in the morning   & 1 capsule (300 mg) at night.  Alveda Reasons 15 MG TABS tablet TAKE 1 TABLET (15 MG TOTAL) BY MOUTH DAILY WITH SUPPER.  . [DISCONTINUED] traMADol (ULTRAM) 50 MG tablet Take 1 tablet (50 mg total) by mouth daily as needed (for pain.).   No facility-administered encounter medications on file as of 10/26/2019.    Review of Systems  Constitutional: Negative for appetite change and unexpected weight change.  HENT: Negative for congestion and sinus pressure.   Respiratory: Negative for cough and chest tightness.        Breathing stable.   Cardiovascular: Negative for chest pain, palpitations and leg swelling.  Gastrointestinal: Negative for abdominal pain, diarrhea, nausea and vomiting.  Genitourinary: Negative for difficulty urinating and dysuria.  Musculoskeletal: Negative for myalgias.       Recent swelling and pain - bilateral hands.    Skin: Negative for color change and rash.  Neurological: Negative for dizziness, light-headedness and headaches.  Psychiatric/Behavioral: Negative for agitation and  dysphoric mood.       Objective:    Physical Exam Vitals reviewed.  Constitutional:      General: She is not in acute distress.    Appearance: Normal appearance.  HENT:     Head: Normocephalic and atraumatic.     Right Ear: External ear normal.     Left Ear: External ear normal.  Eyes:     General: No scleral icterus.       Right eye: No discharge.        Left eye: No discharge.     Conjunctiva/sclera: Conjunctivae normal.  Neck:     Thyroid: No thyromegaly.  Cardiovascular:     Rate and Rhythm: Normal rate and regular rhythm.  Pulmonary:     Effort: No respiratory distress.     Breath sounds: Normal breath sounds. No wheezing.  Abdominal:     General: Bowel sounds are normal.     Palpations: Abdomen is soft.     Tenderness: There is no abdominal tenderness.  Musculoskeletal:        General: No swelling or tenderness.     Cervical back: Neck supple. No tenderness.  Lymphadenopathy:     Cervical: No cervical adenopathy.  Skin:    Findings: No erythema or rash.  Neurological:     Mental Status: She is alert.  Psychiatric:        Mood and Affect: Mood normal.        Behavior: Behavior normal.     BP 122/68   Pulse 63   Temp 97.9 F (36.6 C) (Oral)   Resp 16   Ht 5\' 3"  (1.6 m)   Wt 139 lb (63 kg)   SpO2 99%   BMI 24.62 kg/m  Wt Readings from Last 3 Encounters:  10/26/19 139 lb (63 kg)  09/09/19 141 lb (64 kg)  09/07/19 140 lb 2 oz (63.6 kg)     Lab Results  Component Value Date   WBC 6.7 09/09/2019   HGB 11.3 (L) 09/09/2019   HCT 34.9 (L) 09/09/2019   PLT 184.0 09/09/2019   GLUCOSE 99 09/09/2019   CHOL 126 07/08/2019   TRIG 124.0 07/08/2019   HDL 44.20 07/08/2019   LDLDIRECT 143.5  12/17/2012   LDLCALC 57 07/08/2019   ALT 16 09/09/2019   AST 14 09/09/2019   NA 134 (L) 09/09/2019   K 4.4 09/09/2019   CL 104 09/09/2019   CREATININE 1.63 (H) 09/09/2019   BUN 63 (H) 09/09/2019   CO2 17 (L) 09/09/2019   TSH 2.00 02/12/2019   INR 1.63  12/29/2017   HGBA1C 5.6 01/27/2018    MM 3D SCREEN BREAST BILATERAL  Result Date: 09/15/2019 CLINICAL DATA:  Screening. EXAM: DIGITAL SCREENING BILATERAL MAMMOGRAM WITH TOMO AND CAD COMPARISON:  Previous exam(s). ACR Breast Density Category c: The breast tissue is heterogeneously dense, which may obscure small masses. FINDINGS: There are no findings suspicious for malignancy. Images were processed with CAD. IMPRESSION: No mammographic evidence of malignancy. A result letter of this screening mammogram will be mailed directly to the patient. RECOMMENDATION: Screening mammogram in one year. (Code:SM-B-01Y) BI-RADS CATEGORY  1: Negative. Electronically Signed   By: Franki Cabot M.D.   On: 09/15/2019 13:59       Assessment & Plan:   Problem List Items Addressed This Visit    SOB (shortness of breath) on exertion    Taking torsemide.  On metoprolol.  No evidence of volume overload.  Breathing stable.        Pulmonary hypertension, unspecified ([place])    Noted on previous echo.  Followed by cardiology.       Primary biliary cholangitis ([place])    Being followed by GI.  Stable.       Persistent atrial fibrillation (HCC)    On xarelto and metoprolol.  Stable.        Peripheral neuropathy (Chronic)    On gabapentin.  Stable.       Iron deficiency anemia    Has seen hematology.  Follow cbc and iron studies.       Hypothyroidism    On thyroid replacement.  Follow tsh.       Hypercholesteremia    Unable to take statin medication.  Follow lipid panel.       Coronary artery disease    Followed by cardiology.  Stable.       CKD (chronic kidney disease) stage 3, GFR 30-59 ml/min    Avoid antiinflammatories.  Follow metabolic panel.       Chronic diastolic heart failure ([place])    Followed by cardiology.  On torsemide.  Follow metabolic panel.       Bilateral hand swelling    On plaquenil.       Autoimmune hepatitis ([place])    Followed by GI.  Follow liver function tests.          Anemia    Has a history of iron deficient anemia.  Has seen hematology.  Follow cbc and iron studies.        Acute diastolic CHF (congestive heart failure) (HCC)    Breathing stable. Taking torsemide.  No evidence of volume overload.  Follow.           Einar Pheasant, MD

## 2019-11-01 ENCOUNTER — Encounter: Payer: Self-pay | Admitting: Internal Medicine

## 2019-11-01 NOTE — Assessment & Plan Note (Signed)
Breathing stable. Taking torsemide.  No evidence of volume overload.  Follow.

## 2019-11-01 NOTE — Assessment & Plan Note (Signed)
Noted on previous echo.  Followed by cardiology.

## 2019-11-01 NOTE — Assessment & Plan Note (Signed)
Followed by GI.  Follow liver function tests.  

## 2019-11-01 NOTE — Assessment & Plan Note (Signed)
On gabapentin.  Stable.  

## 2019-11-01 NOTE — Assessment & Plan Note (Signed)
Unable to take statin medication.  Follow lipid panel.  

## 2019-11-01 NOTE — Assessment & Plan Note (Signed)
Followed by cardiology.  On torsemide.  Follow metabolic panel.

## 2019-11-01 NOTE — Assessment & Plan Note (Signed)
Avoid antiinflammatories.  Follow metabolic panel.  

## 2019-11-01 NOTE — Assessment & Plan Note (Signed)
Has seen hematology.  Follow cbc and iron studies.  

## 2019-11-01 NOTE — Assessment & Plan Note (Signed)
On plaquenil

## 2019-11-01 NOTE — Assessment & Plan Note (Signed)
On xarelto and metoprolol.  Stable.

## 2019-11-01 NOTE — Assessment & Plan Note (Signed)
Has a history of iron deficient anemia.  Has seen hematology.  Follow cbc and iron studies.

## 2019-11-01 NOTE — Assessment & Plan Note (Signed)
Being followed by GI.  Stable.

## 2019-11-01 NOTE — Assessment & Plan Note (Signed)
On thyroid replacement.  Follow tsh.  

## 2019-11-01 NOTE — Assessment & Plan Note (Signed)
Followed by cardiology. Stable.   

## 2019-11-01 NOTE — Assessment & Plan Note (Signed)
Taking torsemide.  On metoprolol.  No evidence of volume overload.  Breathing stable.

## 2019-11-02 DIAGNOSIS — M1990 Unspecified osteoarthritis, unspecified site: Secondary | ICD-10-CM | POA: Diagnosis not present

## 2019-11-02 DIAGNOSIS — Z79899 Other long term (current) drug therapy: Secondary | ICD-10-CM | POA: Diagnosis not present

## 2019-11-09 DIAGNOSIS — E790 Hyperuricemia without signs of inflammatory arthritis and tophaceous disease: Secondary | ICD-10-CM | POA: Diagnosis not present

## 2019-11-09 DIAGNOSIS — M1990 Unspecified osteoarthritis, unspecified site: Secondary | ICD-10-CM | POA: Diagnosis not present

## 2019-11-09 DIAGNOSIS — N1832 Chronic kidney disease, stage 3b: Secondary | ICD-10-CM | POA: Diagnosis not present

## 2019-11-09 DIAGNOSIS — M79642 Pain in left hand: Secondary | ICD-10-CM | POA: Diagnosis not present

## 2019-11-09 DIAGNOSIS — K7460 Unspecified cirrhosis of liver: Secondary | ICD-10-CM | POA: Diagnosis not present

## 2019-11-09 DIAGNOSIS — M79641 Pain in right hand: Secondary | ICD-10-CM | POA: Diagnosis not present

## 2019-11-15 NOTE — Progress Notes (Signed)
Advanced Heart Failure Clinic Note   Date:  11/15/2019   ID:  Ruth Roth, DOB Apr 29, 1928, MRN [id number]  Location: Home  Provider location: [place] [facility] Type of Visit: Established patient  PCP:  Einar Pheasant, MD  Cardiologist:  [name], MD Primary HF: [name]  Chief Complaint: Heart Failure follow-up   History of Present Illness:  Ruth Roth is a 86 y.o. female with a history of CAD, chronic diastolic HF with severe mitral reguritationm, chronic atrial fibrillation, hypertension, peptic ulcer disease.  Admitted [facility] 08/21/17 s/p R/LHC which showed significantly elevated filling pressures, severe MR, and low cardiac output, and then transferred to [facility] 6/14 for consideration of MitraClip. AKI noted on admission felt to be due to cardiorenal syndrome. She required milrinone and lasix for diuresis. Diuresed 25 lbs and transitioned to 20 mg torsemide daily. Case and images reviewed at length with structural heart team. MR felt to be truly functional with improvement from hemodynamic optimization. Not felt to be MitraClip candidate.Discharge weight: 121 lbs.   In 2020 had recurrent AF with RVR Amio 200 bid started. Underwent DC-CV on 9/210/20. PCP cut back torsemide to 20 qOD due to AKI but developed volume overload. Torsemide increased back to 20 daily. Developed recurrent AF and Amio stopped. Saw Dr. Fletcher Anon in 04/30/19 and metoprolol increased to 75 bid for better rate control. Says she felt better on the 50 but is tolerating this ok.  Here for routine f/u. Says she is doing pretty good. Still dyspneic with mild exertion but no change. No edema, orthopnea or PND. No dizziness. No bleeding with Xarelto.     Cardiac Studies: Echo 10/09/17: EF 55-60%, no AI, mild to mod MR, LA moderately dilated, RA mildly dilated, RV normal, PA peak pressure 52 mmHg  [facility] 08/21/2017 - Mild non-obstructive CAD RHC Procedural Findings:(Obtained from Scan  08/21/17 at 1030) Hemodynamics (mmHg) RA mean17 RV68/11 (19) PA62/27 (44) PCWP41 AO147/67 (102) Cardiac Output(Fick) 2.45 Cardiac Index(Fick) 1.46 PVR 1.23 WU  Echo 08/01/17 LVEF 50-55%, Mild AS, Mod MR, Mod LAE, Mild RAE, Mod TR, PA peak pressure 65 mm Hg.MR ++ calcified, and RV mild/moderately reduced function.      Past Medical History:  Diagnosis Date  . (HFpEF) heart failure with preserved ejection fraction ([place])    a. 07/2017 Echo: EF 50-55%, no rwma, mild AS/AI, mod MR, mod dil LA, mod TR, PASP 50mmHg.  . Autoimmune hepatitis ([place])    followed by Dr [name]  . Fibrocystic breast disease   . Glaucoma   . Hypercholesterolemia   . Hypertension   . Hypothyroidism   . Inflammatory arthritis   . MI (myocardial infarction) ([place])   . Neuropathy   . Non-obstructive Coronary artery disease    a. 05/2014 NSTEMI/Cath: LM nl, LAD mild dzs, LCX 60ost hazy (? culprit), RCA mild dzs. EF 60% by echo-->Med Rx; b. 08/2017 Cath: LM 30ost, LAD min irregs, LCX small, 40ost/p, RCA large, min irregs, RPDA/RPL1 nl, EF 50-55%. 4+MR.  . Osteoarthritis   . Permanent atrial fibrillation (HCC)    a. CHA2DS2VASc = 6-->Xarelto.  . 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 Severe mitral regurgitation    a. 07/2017 Echo: Mod MR; b. 08/2017 Cath: 4+/Severe MR.   Past Surgical History:  Procedure Laterality Date  . ABDOMINAL HYSTERECTOMY  1963   partial, secondary to fibroids  . APPENDECTOMY    . Billroth    .  CARDIAC CATHETERIZATION  05/12/14   [facility]  . CARDIOVERSION N/A 12/26/2016   Procedure: CARDIOVERSION;  Surgeon: [name], MD;  Location: [facility] ORS;  Service: Cardiovascular;  Laterality: N/A;  . CARDIOVERSION N/A 05/16/2017   Procedure: CARDIOVERSION;  Surgeon: [name], MD;  Location: [facility] ORS;  Service: Cardiovascular;  Laterality: N/A;  . CARDIOVERSION N/A 06/09/2017   Procedure: CARDIOVERSION;  Surgeon: [name], MD;  Location: [facility] ORS;  Service: Cardiovascular;  Laterality: N/A;  . CARDIOVERSION N/A 11/30/2018   Procedure: CARDIOVERSION;  Surgeon: [name], MD;  Location: [facility] ORS;  Service: Cardiovascular;  Laterality: N/A;  . CHOLECYSTECTOMY  1998  . COLONOSCOPY  2009  . RIGHT/LEFT HEART CATH AND CORONARY ANGIOGRAPHY Bilateral 08/21/2017   Procedure: RIGHT/LEFT HEART CATH AND CORONARY ANGIOGRAPHY;  Surgeon: [name], MD;  Location: [place] CV LAB;  Service: Cardiovascular;  Laterality: Bilateral;  . TEE WITHOUT CARDIOVERSION N/A 08/25/2017   Procedure: TRANSESOPHAGEAL ECHOCARDIOGRAM (TEE);  Surgeon: Jolaine Artist, MD;  Location: [facility] ENDOSCOPY;  Service: Cardiovascular;  Laterality: N/A;  . UPPER GI ENDOSCOPY  2004     Current Outpatient Medications  Medication Sig Dispense Refill  . calcitRIOL (ROCALTROL) 0.25 MCG capsule Take 0.25 mcg by mouth daily.     . dorzolamide (TRUSOPT) 2 % ophthalmic solution Place 1 drop into the left eye 2 (two) times daily.    . fluticasone (FLONASE) 50 MCG/ACT nasal spray Place 2 sprays into both nostrils daily as needed for allergies.     Marland Kitchen gabapentin (NEURONTIN) 300 MG capsule TAKE 2 CAPSULES BY MOUTH FOUR TIMES DAILY (Patient taking differently: Take 300-600 mg by mouth See admin instructions. 300 mg twice daily, 600 mg at bedtime) 720 capsule 0  . hydroxychloroquine (PLAQUENIL) 200 MG tablet Take 200 mg by mouth daily.    Marland Kitchen ipratropium (ATROVENT) 0.06 % nasal spray PLACE 1 SPRAY INTO BOTH NOSTRILS DAILY AS NEEDED FOR RHINITIS. 15 mL 1  . latanoprost (XALATAN) 0.005 % ophthalmic solution Place 1 drop into both eyes at bedtime.     Marland Kitchen levothyroxine (SYNTHROID) 112 MCG tablet Take 112 mcg by mouth daily before breakfast.    . losartan (COZAAR) 25 MG tablet Take 25 mg by mouth daily.     . metoprolol tartrate (LOPRESSOR) 25 MG tablet Take 1 tablet (25 mg total) by mouth 2 (two) times daily. 180 tablet 2  . metoprolol tartrate  (LOPRESSOR) 50 MG tablet Take 1 tablet (50 mg total) by mouth 2 (two) times daily. 180 tablet 2  . Multiple Vitamin (MULTI-VITAMIN) tablet Take 1 tablet by mouth at bedtime.     . pantoprazole (PROTONIX) 20 MG tablet TAKE 1 TABLET BY MOUTH EVERY DAY 90 tablet 1  . torsemide (DEMADEX) 20 MG tablet TAKE 1 TABLET BY MOUTH EVERY DAY 90 tablet 3  . traMADol (ULTRAM) 50 MG tablet Take 1 tablet (50 mg total) by mouth 2 (two) times daily as needed (for pain.). 60 tablet 0  . ursodiol (ACTIGALL) 300 MG capsule Take 300-600 mg by mouth See admin instructions. Take 2 capsules (600 mg) daily in the morning   & 1 capsule (300 mg) at night.    Alveda Reasons 15 MG TABS tablet TAKE 1 TABLET (15 MG TOTAL) BY MOUTH DAILY WITH SUPPER. 30 tablet 6   No current facility-administered medications for this encounter.    Allergies:   Statins, Haldol [haloperidol lactate], Librax [chlordiazepoxide-clidinium], Plavix [clopidogrel bisulfate], and Ramipril   Social History:  The patient  reports that she has never smoked. She has never used smokeless tobacco. She reports that she does not drink alcohol and does not use drugs.   Family History:  The patient's family history includes Colon cancer in her daughter; Esophageal cancer in her brother; Ovarian cancer in her daughter; Stroke in her mother.   ROS:  Please see the history of present illness.   All other systems are personally reviewed and negative.   Vitals:   11/16/19 1422  BP: 100/65  Pulse: 85  SpO2: 96%  Weight: 62.6 kg (138 lb)    Exam:   General:  Elderly. No resp difficulty HEENT: normal Neck: supple. JVP 7 Carotids 2+ bilat; no bruits. No lymphadenopathy or thryomegaly appreciated. Cor: PMI nondisplaced. Irregular rate & rhythm. 2/6 MR Lungs: clear Abdomen: soft, nontender, nondistended. No hepatosplenomegaly. No bruits or masses. Good bowel sounds. Extremities: no cyanosis, clubbing, rash, edema Neuro: alert & orientedx3, cranial nerves grossly  intact. moves all 4 extremities w/o difficulty. Affect pleasant   Recent Labs: 02/12/2019: TSH 2.00 06/02/2019: B Natriuretic Peptide 383.8 09/09/2019: ALT 16; BUN 63; Creatinine, Ser 1.63; Hemoglobin 11.3; Platelets 184.0; Potassium 4.4; Sodium 134  Personally reviewed   Wt Readings from Last 3 Encounters:  10/26/19 63 kg (139 lb)  09/09/19 64 kg (141 lb)  09/07/19 63.6 kg (140 lb 2 oz)       ASSESSMENT AND PLAN:  1.Chronic diastolic congestive heart failure with h/o low output: - Echo 08/01/17 LVEF 50-55%, Mild AS, Mod MR, Mod LAE, Mild RAE, Mod TR, RV moderately HK PA peak pressure 65 mm Hg. - Cath 08/21/17 with signficantly elevated filling pressures, severe MR, and low cardiac output. Had cardiorenal syndrome on admit with severe MR and RV failure and required milrinone - Echo 10/09/17: EF 55-60%, mild to mod MR - Echo 12/20 EF 60-65% mod MR  - Stable NYHA II-III. Volume status looks good. Continue current regimen - Labs today  2. Severe mitral regurgitation: - Echo 08/01/17 LVEF 50-55%, Mild AS, Mod MR, Mod LAE, Mild RAE, Mod TR, PA peak pressure 65 mm Hg.MR ++ calcified, and RV mild/moderately reduced function. - TEE 6/17 showed only mild MR with restricted posterior leaflet.  -Echo 10/09/17: EF 55-60%, mild to mod MR -  Echo 12/20 EF 60-65% mod MR  -Likely functional MR and MR improved with hemodynamic optimization.  Doing well now. Will follow closely if fails close medical therapy consider MitraClip. No change  3. Chronic atrial fibrillation: - Has previously failed amiodarone and DCCV x3.  - Failed DC-CV with amio on 11/30/18 - Doubt we will be able to maintain NSR. Seems to be tolerating AF ok. If cannot maintain rate control will need AVN ablation and CRT  - Continue Lopressor 75 bid. (dose recently increased by Dr. Fletcher Anon and rate better) - Continue Xarelto. No bleeding. CBC today - Seen by Dr. Rayann Heman 12/01/17. He did not recommend either afib ablation or AV node  ablation + PPM at that time - Has AF on ECG today with rate of 97. Will get Zio to ensure adequacy of rate control. Would like to avoid titrating b-blocker up as it has caused fatigue in past.   4. Essential hypertension: -Blood pressure well controlled. Continue current regimen.  5. CKD 3 with h/o AKI - Creatinine 1.27 -> 1.63 on 09/09/19 - repeat today  Signed, Glori Bickers, MD  11/15/2019 10:28 PM  Advanced [facility] [place]  [id number] [phone] (office) [phone] (fax)

## 2019-11-16 ENCOUNTER — Ambulatory Visit (HOSPITAL_COMMUNITY)
Admission: RE | Admit: 2019-11-16 | Discharge: 2019-11-16 | Disposition: A | Discharge status: Home / Self Care | Payer: Medicare Other | Source: Ambulatory Visit | Attending: Internal Medicine | Admitting: Internal Medicine

## 2019-11-16 ENCOUNTER — Other Ambulatory Visit: Payer: Self-pay

## 2019-11-16 VITALS — BP 100/65 | HR 85 | Wt 138.0 lb

## 2019-11-16 DIAGNOSIS — I2510 Atherosclerotic heart disease of native coronary artery without angina pectoris: Secondary | ICD-10-CM | POA: Diagnosis not present

## 2019-11-16 DIAGNOSIS — I10 Essential (primary) hypertension: Secondary | ICD-10-CM

## 2019-11-16 DIAGNOSIS — I482 Chronic atrial fibrillation, unspecified: Secondary | ICD-10-CM | POA: Insufficient documentation

## 2019-11-16 DIAGNOSIS — N179 Acute kidney failure, unspecified: Secondary | ICD-10-CM | POA: Diagnosis not present

## 2019-11-16 DIAGNOSIS — N1832 Chronic kidney disease, stage 3b: Secondary | ICD-10-CM | POA: Diagnosis not present

## 2019-11-16 DIAGNOSIS — Z79899 Other long term (current) drug therapy: Secondary | ICD-10-CM | POA: Diagnosis not present

## 2019-11-16 DIAGNOSIS — E039 Hypothyroidism, unspecified: Secondary | ICD-10-CM | POA: Insufficient documentation

## 2019-11-16 DIAGNOSIS — H409 Unspecified glaucoma: Secondary | ICD-10-CM | POA: Diagnosis not present

## 2019-11-16 DIAGNOSIS — I5032 Chronic diastolic (congestive) heart failure: Secondary | ICD-10-CM | POA: Diagnosis not present

## 2019-11-16 DIAGNOSIS — I252 Old myocardial infarction: Secondary | ICD-10-CM | POA: Insufficient documentation

## 2019-11-16 DIAGNOSIS — I340 Nonrheumatic mitral (valve) insufficiency: Secondary | ICD-10-CM

## 2019-11-16 DIAGNOSIS — G629 Polyneuropathy, unspecified: Secondary | ICD-10-CM | POA: Insufficient documentation

## 2019-11-16 DIAGNOSIS — Z90711 Acquired absence of uterus with remaining cervical stump: Secondary | ICD-10-CM | POA: Diagnosis not present

## 2019-11-16 DIAGNOSIS — Z79890 Hormone replacement therapy (postmenopausal): Secondary | ICD-10-CM | POA: Insufficient documentation

## 2019-11-16 DIAGNOSIS — Z8711 Personal history of peptic ulcer disease: Secondary | ICD-10-CM | POA: Insufficient documentation

## 2019-11-16 DIAGNOSIS — N183 Chronic kidney disease, stage 3 unspecified: Secondary | ICD-10-CM | POA: Insufficient documentation

## 2019-11-16 DIAGNOSIS — I130 Hypertensive heart and chronic kidney disease with heart failure and stage 1 through stage 4 chronic kidney disease, or unspecified chronic kidney disease: Secondary | ICD-10-CM | POA: Insufficient documentation

## 2019-11-16 DIAGNOSIS — Z888 Allergy status to other drugs, medicaments and biological substances status: Secondary | ICD-10-CM | POA: Insufficient documentation

## 2019-11-16 DIAGNOSIS — E7800 Pure hypercholesterolemia, unspecified: Secondary | ICD-10-CM | POA: Insufficient documentation

## 2019-11-16 DIAGNOSIS — Z7901 Long term (current) use of anticoagulants: Secondary | ICD-10-CM | POA: Insufficient documentation

## 2019-11-16 LAB — BASIC METABOLIC PANEL
Anion gap: 11 (ref 5–15)
BUN: 69 mg/dL — ABNORMAL HIGH (ref 8–23)
CO2: 19 mmol/L — ABNORMAL LOW (ref 22–32)
Calcium: 9.5 mg/dL (ref 8.9–10.3)
Chloride: 106 mmol/L (ref 98–111)
Creatinine, Ser: 2.08 mg/dL — ABNORMAL HIGH (ref 0.44–1.00)
GFR calc Af Amer: 24 mL/min — ABNORMAL LOW (ref >60)
GFR calc non Af Amer: 21 mL/min — ABNORMAL LOW (ref >60)
Glucose, Bld: 85 mg/dL (ref 70–99)
Potassium: 4.8 mmol/L (ref 3.5–5.1)
Sodium: 136 mmol/L (ref 135–145)

## 2019-11-16 LAB — CBC
HCT: 41.4 % (ref 36.0–46.0)
Hemoglobin: 12.5 g/dL (ref 12.0–15.0)
MCH: 30.5 pg (ref 26.0–34.0)
MCHC: 30.2 g/dL (ref 30.0–36.0)
MCV: 101 fL — ABNORMAL HIGH (ref 80.0–100.0)
Platelets: 190 10*3/uL (ref 150–400)
RBC: 4.1 MIL/uL (ref 3.87–5.11)
RDW: 14.4 % (ref 11.5–15.5)
WBC: 5.9 10*3/uL (ref 4.0–10.5)
nRBC: 0 % (ref 0.0–0.2)

## 2019-11-16 LAB — BRAIN NATRIURETIC PEPTIDE: B Natriuretic Peptide: 511.5 pg/mL — ABNORMAL HIGH (ref 0.0–100.0)

## 2019-11-16 NOTE — Patient Instructions (Signed)
Labs done today, your results will be available in MyChart, we will contact you for abnormal readings.  Please call our office in January 2022 to schedule your follow up appointment  If you have any questions or concerns before your next appointment please send [place] a message through [place] or call our office at [phone].    TO LEAVE A MESSAGE FOR THE NURSE SELECT OPTION 2, PLEASE LEAVE A MESSAGE INCLUDING:  YOUR NAME  DATE OF BIRTH  CALL BACK NUMBER  REASON FOR CALL**this is important as we prioritize the call backs  YOU WILL RECEIVE A CALL BACK THE SAME DAY AS LONG AS YOU CALL BEFORE 4:00 PM  At the [facility], you and your health needs are our priority. As part of our continuing mission to provide you with exceptional heart care, we have created designated Provider Care Teams. These Care Teams include your primary Cardiologist (physician) and Advanced Practice Providers (APPs- Physician Assistants and Nurse Practitioners) who all work together to provide you with the care you need, when you need it.   You may see any of the following providers on your designated Care Team at your next follow up:  Dr Glori Bickers  Dr Haynes Kerns, NP  Lyda Jester, [place]  Audry Riles, PharmD   Please be sure to bring in all your medications bottles to every appointment.

## 2019-12-06 DIAGNOSIS — I482 Chronic atrial fibrillation, unspecified: Secondary | ICD-10-CM | POA: Diagnosis not present

## 2019-12-06 DIAGNOSIS — M8588 Other specified disorders of bone density and structure, other site: Secondary | ICD-10-CM | POA: Diagnosis not present

## 2019-12-13 NOTE — Addendum Note (Signed)
Encounter addended by: Micki Riley, RN on: 12/13/2019 12:16 PM  Actions taken: Imaging Exam ended

## 2019-12-21 ENCOUNTER — Other Ambulatory Visit: Payer: Self-pay | Admitting: Gastroenterology

## 2019-12-21 DIAGNOSIS — K7460 Unspecified cirrhosis of liver: Secondary | ICD-10-CM

## 2019-12-21 DIAGNOSIS — K754 Autoimmune hepatitis: Secondary | ICD-10-CM | POA: Diagnosis not present

## 2019-12-28 ENCOUNTER — Ambulatory Visit: Payer: Medicare Other

## 2020-01-03 ENCOUNTER — Ambulatory Visit
Admission: RE | Admit: 2020-01-03 | Discharge: 2020-01-03 | Disposition: A | Discharge status: Home / Self Care | Payer: Medicare Other | Source: Ambulatory Visit | Attending: Gastroenterology | Admitting: Gastroenterology

## 2020-01-03 ENCOUNTER — Other Ambulatory Visit: Payer: Self-pay

## 2020-01-03 ENCOUNTER — Inpatient Hospital Stay: Payer: Medicare Other | Attending: Oncology

## 2020-01-03 DIAGNOSIS — E039 Hypothyroidism, unspecified: Secondary | ICD-10-CM | POA: Insufficient documentation

## 2020-01-03 DIAGNOSIS — M1990 Unspecified osteoarthritis, unspecified site: Secondary | ICD-10-CM | POA: Insufficient documentation

## 2020-01-03 DIAGNOSIS — Z823 Family history of stroke: Secondary | ICD-10-CM | POA: Insufficient documentation

## 2020-01-03 DIAGNOSIS — Z8041 Family history of malignant neoplasm of ovary: Secondary | ICD-10-CM | POA: Insufficient documentation

## 2020-01-03 DIAGNOSIS — I5032 Chronic diastolic (congestive) heart failure: Secondary | ICD-10-CM | POA: Insufficient documentation

## 2020-01-03 DIAGNOSIS — N200 Calculus of kidney: Secondary | ICD-10-CM | POA: Diagnosis not present

## 2020-01-03 DIAGNOSIS — I252 Old myocardial infarction: Secondary | ICD-10-CM | POA: Insufficient documentation

## 2020-01-03 DIAGNOSIS — Z801 Family history of malignant neoplasm of trachea, bronchus and lung: Secondary | ICD-10-CM | POA: Insufficient documentation

## 2020-01-03 DIAGNOSIS — I2510 Atherosclerotic heart disease of native coronary artery without angina pectoris: Secondary | ICD-10-CM | POA: Insufficient documentation

## 2020-01-03 DIAGNOSIS — Z8711 Personal history of peptic ulcer disease: Secondary | ICD-10-CM | POA: Diagnosis not present

## 2020-01-03 DIAGNOSIS — I4821 Permanent atrial fibrillation: Secondary | ICD-10-CM | POA: Insufficient documentation

## 2020-01-03 DIAGNOSIS — K754 Autoimmune hepatitis: Secondary | ICD-10-CM | POA: Insufficient documentation

## 2020-01-03 DIAGNOSIS — E7800 Pure hypercholesterolemia, unspecified: Secondary | ICD-10-CM | POA: Diagnosis not present

## 2020-01-03 DIAGNOSIS — D7589 Other specified diseases of blood and blood-forming organs: Secondary | ICD-10-CM | POA: Diagnosis not present

## 2020-01-03 DIAGNOSIS — D509 Iron deficiency anemia, unspecified: Secondary | ICD-10-CM | POA: Insufficient documentation

## 2020-01-03 DIAGNOSIS — K7689 Other specified diseases of liver: Secondary | ICD-10-CM | POA: Diagnosis not present

## 2020-01-03 DIAGNOSIS — K7460 Unspecified cirrhosis of liver: Secondary | ICD-10-CM

## 2020-01-03 DIAGNOSIS — Z9049 Acquired absence of other specified parts of digestive tract: Secondary | ICD-10-CM | POA: Diagnosis not present

## 2020-01-03 DIAGNOSIS — Z800 Family history of malignant neoplasm of digestive organs: Secondary | ICD-10-CM | POA: Diagnosis not present

## 2020-01-03 DIAGNOSIS — R040 Epistaxis: Secondary | ICD-10-CM | POA: Insufficient documentation

## 2020-01-03 DIAGNOSIS — Z888 Allergy status to other drugs, medicaments and biological substances status: Secondary | ICD-10-CM | POA: Insufficient documentation

## 2020-01-03 DIAGNOSIS — Z7901 Long term (current) use of anticoagulants: Secondary | ICD-10-CM | POA: Insufficient documentation

## 2020-01-03 DIAGNOSIS — Z79899 Other long term (current) drug therapy: Secondary | ICD-10-CM | POA: Insufficient documentation

## 2020-01-03 DIAGNOSIS — D500 Iron deficiency anemia secondary to blood loss (chronic): Secondary | ICD-10-CM

## 2020-01-03 DIAGNOSIS — N281 Cyst of kidney, acquired: Secondary | ICD-10-CM | POA: Diagnosis not present

## 2020-01-03 LAB — CBC WITH DIFFERENTIAL/PLATELET
Abs Immature Granulocytes: 0.04 10*3/uL (ref 0.00–0.07)
Basophils Absolute: 0 10*3/uL (ref 0.0–0.1)
Basophils Relative: 1 %
Eosinophils Absolute: 0.4 10*3/uL (ref 0.0–0.5)
Eosinophils Relative: 5 %
HCT: 39.9 % (ref 36.0–46.0)
Hemoglobin: 12.5 g/dL (ref 12.0–15.0)
Immature Granulocytes: 1 %
Lymphocytes Relative: 14 %
Lymphs Abs: 0.9 10*3/uL (ref 0.7–4.0)
MCH: 31.8 pg (ref 26.0–34.0)
MCHC: 31.3 g/dL (ref 30.0–36.0)
MCV: 101.5 fL — ABNORMAL HIGH (ref 80.0–100.0)
Monocytes Absolute: 0.7 10*3/uL (ref 0.1–1.0)
Monocytes Relative: 10 %
Neutro Abs: 4.6 10*3/uL (ref 1.7–7.7)
Neutrophils Relative %: 69 %
Platelets: 179 10*3/uL (ref 150–400)
RBC: 3.93 MIL/uL (ref 3.87–5.11)
RDW: 15.5 % (ref 11.5–15.5)
WBC: 6.6 10*3/uL (ref 4.0–10.5)
nRBC: 0 % (ref 0.0–0.2)

## 2020-01-03 LAB — IRON AND TIBC
Iron: 76 ug/dL (ref 28–170)
Saturation Ratios: 17 % (ref 10.4–31.8)
TIBC: 454 ug/dL — ABNORMAL HIGH (ref 250–450)
UIBC: 378 ug/dL

## 2020-01-03 LAB — FERRITIN: Ferritin: 30 ng/mL (ref 11–307)

## 2020-01-04 DIAGNOSIS — M069 Rheumatoid arthritis, unspecified: Secondary | ICD-10-CM | POA: Diagnosis not present

## 2020-01-04 DIAGNOSIS — Z79899 Other long term (current) drug therapy: Secondary | ICD-10-CM | POA: Diagnosis not present

## 2020-01-04 DIAGNOSIS — H401131 Primary open-angle glaucoma, bilateral, mild stage: Secondary | ICD-10-CM | POA: Diagnosis not present

## 2020-01-05 ENCOUNTER — Encounter: Payer: Self-pay | Admitting: Oncology

## 2020-01-05 ENCOUNTER — Other Ambulatory Visit: Payer: Self-pay

## 2020-01-05 ENCOUNTER — Inpatient Hospital Stay (HOSPITAL_BASED_OUTPATIENT_CLINIC_OR_DEPARTMENT_OTHER): Payer: Medicare Other | Admitting: Oncology

## 2020-01-05 VITALS — BP 144/75 | HR 87 | Temp 98.1°F | Resp 16 | Wt 141.7 lb

## 2020-01-05 DIAGNOSIS — I252 Old myocardial infarction: Secondary | ICD-10-CM | POA: Diagnosis not present

## 2020-01-05 DIAGNOSIS — K754 Autoimmune hepatitis: Secondary | ICD-10-CM | POA: Diagnosis not present

## 2020-01-05 DIAGNOSIS — D500 Iron deficiency anemia secondary to blood loss (chronic): Secondary | ICD-10-CM

## 2020-01-05 DIAGNOSIS — I4821 Permanent atrial fibrillation: Secondary | ICD-10-CM | POA: Diagnosis not present

## 2020-01-05 DIAGNOSIS — D7589 Other specified diseases of blood and blood-forming organs: Secondary | ICD-10-CM | POA: Diagnosis not present

## 2020-01-05 DIAGNOSIS — Z7901 Long term (current) use of anticoagulants: Secondary | ICD-10-CM

## 2020-01-05 DIAGNOSIS — D509 Iron deficiency anemia, unspecified: Secondary | ICD-10-CM | POA: Diagnosis not present

## 2020-01-05 DIAGNOSIS — R040 Epistaxis: Secondary | ICD-10-CM | POA: Diagnosis not present

## 2020-01-05 MED ORDER — VITAMIN B-12 1000 MCG PO TABS: 1000.0000 ug | ORAL_TABLET | Freq: Every day | ORAL | 3 refills | Status: DC

## 2020-01-05 NOTE — Progress Notes (Signed)
Hematology/Oncology follow up note [facility] [phone] [phone]   Patient Care Team: Einar Pheasant, MD as PCP - General (Unknown Physician Specialty) [name], MD as PCP - Cardiology (Cardiology) Bensimhon, Shaune Pascal, MD as PCP - Advanced Heart Failure (Cardiology) Byrnett, Forest Gleason, MD (General Surgery) Earlie Server, MD as Consulting Physician (Oncology)  REFERRING PROVIDER: Einar Pheasant, MD CHIEF COMPLAINTS/REASON FOR VISIT:  Evaluation of anemia  HISTORY OF PRESENTING ILLNESS:  Ruth Roth is a  86 y.o.  female with PMH listed below who was referred to me for evaluation of anemia Reviewed patient's recent labs, Labs revealed anemia with hemoglobin of 10.2 on 01/27/2018. MCV 88.9,   Reviewed patient's previous labs ordered by primary care physician's office, anemia is chronic onset , duration is since 2016, gradually declines. No aggravating or improving factors. She takes Xarelto for A Fib. Has recurrent epistaxis. History of IDA, cannot tolerate oral iron supplements.   Associated signs and symptoms: Patient reports fatigue.  deniesSOB with exertion.  Denies weight loss, easy bruising, hematochezia, hemoptysis, hematuria. Context: History of GI bleeding: deneis               History of Chronic kidney disease: CKD               Last colonoscopy: 2007 Patient has a history of autoimmune hepatitis/PBC  Follows up with gastroenterology.  # received IV Feraheme 510 mg x 2. INTERVAL HISTORY Ruth Roth is a 86 y.o. female who has above history reviewed by me today presents for follow up visit for management of anemia Problems and complaints are listed below:  Patient is on chronic anticoagulation with Xarelto for atrial fibrillation. Patient has no new complaints.  Denies any bleeding events. Chronic fatigue unchanged.    Review of Systems  Constitutional: Positive for fatigue. Negative for appetite change, chills  and fever.  HENT:   Negative for hearing loss and voice change.   Eyes: Negative for eye problems.  Respiratory: Negative for chest tightness and cough.   Cardiovascular: Negative for chest pain.  Gastrointestinal: Negative for abdominal distention, abdominal pain and blood in stool.  Endocrine: Negative for hot flashes.  Genitourinary: Negative for difficulty urinating and frequency.   Musculoskeletal: Negative for arthralgias.  Skin: Negative for itching and rash.  Neurological: Negative for extremity weakness.  Hematological: Negative for adenopathy.  Psychiatric/Behavioral: Negative for confusion.    MEDICAL HISTORY:  Past Medical History:  Diagnosis Date  . (HFpEF) heart failure with preserved ejection fraction ([place])    a. 07/2017 Echo: EF 50-55%, no rwma, mild AS/AI, mod MR, mod dil LA, mod TR, PASP 34mmHg.  . Autoimmune hepatitis ([place])    followed by Dr [name]  . Fibrocystic breast disease   . Glaucoma   . Hypercholesterolemia   . Hypertension   . Hypothyroidism   . Inflammatory arthritis   . MI (myocardial infarction) ([place])   . Neuropathy   . Non-obstructive Coronary artery disease    a. 05/2014 NSTEMI/Cath: LM nl, LAD mild dzs, LCX 60ost hazy (? culprit), RCA mild dzs. EF 60% by echo-->Med Rx; b. 08/2017 Cath: LM 30ost, LAD min irregs, LCX small, 40ost/p, RCA large, min irregs, RPDA/RPL1 nl, EF 50-55%. 4+MR.  . Osteoarthritis   . Permanent atrial fibrillation (HCC)    a. CHA2DS2VASc = 6-->Xarelto.  . 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 Severe mitral regurgitation    a. 07/2017  Echo: Mod MR; b. 08/2017 Cath: 4+/Severe MR.    SURGICAL HISTORY: Past Surgical History:  Procedure Laterality Date  . ABDOMINAL HYSTERECTOMY  1963   partial, secondary to fibroids  . APPENDECTOMY    . Billroth    . CARDIAC CATHETERIZATION  05/12/14   [facility]  . CARDIOVERSION N/A 12/26/2016   Procedure: CARDIOVERSION;  Surgeon: [name], MD;  Location: [facility] ORS;  Service: Cardiovascular;  Laterality: N/A;  . CARDIOVERSION N/A 05/16/2017   Procedure: CARDIOVERSION;  Surgeon: [name], MD;  Location: [facility] ORS;  Service: Cardiovascular;  Laterality: N/A;  . CARDIOVERSION N/A 06/09/2017   Procedure: CARDIOVERSION;  Surgeon: [name], MD;  Location: [facility] ORS;  Service: Cardiovascular;  Laterality: N/A;  . CARDIOVERSION N/A 11/30/2018   Procedure: CARDIOVERSION;  Surgeon: [name], MD;  Location: [facility] ORS;  Service: Cardiovascular;  Laterality: N/A;  . CHOLECYSTECTOMY  1998  . COLONOSCOPY  2009  . RIGHT/LEFT HEART CATH AND CORONARY ANGIOGRAPHY Bilateral 08/21/2017   Procedure: RIGHT/LEFT HEART CATH AND CORONARY ANGIOGRAPHY;  Surgeon: [name], MD;  Location: [facility];  Service: Cardiovascular;  Laterality: Bilateral;  . TEE WITHOUT CARDIOVERSION N/A 08/25/2017   Procedure: TRANSESOPHAGEAL ECHOCARDIOGRAM (TEE);  Surgeon: Jolaine Artist, MD;  Location: [facility] ENDOSCOPY;  Service: Cardiovascular;  Laterality: N/A;  . UPPER GI ENDOSCOPY  2004    SOCIAL HISTORY: Social History   Socioeconomic History  . Marital status: Widowed    Spouse name: Not on file  . Number of children: 2  . Years of education: Not on file  . Highest education level: Not on file  Occupational History  . Occupation: retired  Tobacco Use  . Smoking status: Never Smoker  . Smokeless tobacco: Never Used  Vaping Use  . Vaping Use: Never used  Substance and Sexual Activity  . Alcohol use: No    Alcohol/week: 0.0 standard drinks  . Drug use: No  . Sexual activity: Never  Other Topics Concern  . Not on file  Social History Narrative  . Not on file   Social Determinants of Health   Financial Resource Strain:   . Difficulty of Paying Living Expenses: Not on file  Food Insecurity:   . Worried About Charity fundraiser in the Last Year: Not on file  . Ran Out of Food in the Last Year: Not on file   Transportation Needs:   . Lack of Transportation (Medical): Not on file  . Lack of Transportation (Non-Medical): Not on file  Physical Activity:   . Days of Exercise per Week: Not on file  . Minutes of Exercise per Session: Not on file  Stress:   . Feeling of Stress : Not on file  Social Connections:   . Frequency of Communication with Friends and Family: Not on file  . Frequency of Social Gatherings with Friends and Family: Not on file  . Attends Religious Services: Not on file  . Active Member of Clubs or Organizations: Not on file  . Attends Archivist Meetings: Not on file  . Marital Status: Not on file  Intimate Partner Violence:   . Fear of Current or Ex-Partner: Not on file  . Emotionally Abused: Not on file  . Physically Abused: Not on file  . Sexually Abused: Not on file    FAMILY HISTORY: Family History  Problem Relation Age of Onset  . Stroke Mother   . Esophageal cancer Brother  also had lung cancer  . Ovarian cancer Daughter   . Colon cancer Daughter   . Breast cancer Neg Hx     ALLERGIES:  is allergic to statins, haldol [haloperidol lactate], librax [chlordiazepoxide-clidinium], plavix [clopidogrel bisulfate], and ramipril.  MEDICATIONS:  Current Outpatient Medications  Medication Sig Dispense Refill  . calcitRIOL (ROCALTROL) 0.25 MCG capsule Take 0.25 mcg by mouth daily.     . dorzolamide (TRUSOPT) 2 % ophthalmic solution Place 1 drop into the left eye 2 (two) times daily.    . fluticasone (FLONASE) 50 MCG/ACT nasal spray Place 2 sprays into both nostrils daily as needed for allergies.     Marland Kitchen gabapentin (NEURONTIN) 300 MG capsule Take 300 mg by mouth 4 (four) times daily.    . hydroxychloroquine (PLAQUENIL) 200 MG tablet Take 200 mg by mouth daily.    Marland Kitchen ipratropium (ATROVENT) 0.06 % nasal spray PLACE 1 SPRAY INTO BOTH NOSTRILS DAILY AS NEEDED FOR RHINITIS. 15 mL 1  . latanoprost (XALATAN) 0.005 % ophthalmic solution Place 1 drop into both  eyes at bedtime.     Marland Kitchen levothyroxine (SYNTHROID) 112 MCG tablet Take 112 mcg by mouth daily before breakfast.    . losartan (COZAAR) 25 MG tablet Take 25 mg by mouth daily.     . metoprolol tartrate (LOPRESSOR) 25 MG tablet Take 1 tablet (25 mg total) by mouth 2 (two) times daily. 180 tablet 2  . metoprolol tartrate (LOPRESSOR) 50 MG tablet Take 1 tablet (50 mg total) by mouth 2 (two) times daily. 180 tablet 2  . Multiple Vitamin (MULTI-VITAMIN) tablet Take 1 tablet by mouth at bedtime.     . pantoprazole (PROTONIX) 20 MG tablet TAKE 1 TABLET BY MOUTH EVERY DAY 90 tablet 1  . torsemide (DEMADEX) 20 MG tablet TAKE 1 TABLET BY MOUTH EVERY DAY 90 tablet 3  . traMADol (ULTRAM) 50 MG tablet Take 1 tablet (50 mg total) by mouth 2 (two) times daily as needed (for pain.). 60 tablet 0  . ursodiol (ACTIGALL) 300 MG capsule Take 300-600 mg by mouth See admin instructions. Take 2 capsules (600 mg) daily in the morning   & 1 capsule (300 mg) at night.    Alveda Reasons 15 MG TABS tablet TAKE 1 TABLET (15 MG TOTAL) BY MOUTH DAILY WITH SUPPER. 30 tablet 6  . vitamin B-12 (CYANOCOBALAMIN) 1000 MCG tablet Take 1 tablet (1,000 mcg total) by mouth daily. 30 tablet 3   No current facility-administered medications for this visit.     PHYSICAL EXAMINATION: ECOG PERFORMANCE STATUS: 1 - Symptomatic but completely ambulatory Vitals:   01/05/20 1331  BP: (!) 144/75  Pulse: 87  Resp: 16  Temp: 98.1 F (36.7 C)   Filed Weights   01/05/20 1331  Weight: 141 lb 11.2 oz (64.3 kg)    Physical Exam Constitutional:      General: She is not in acute distress. HENT:     Head: Normocephalic and atraumatic.  Eyes:     General: No scleral icterus.    Pupils: Pupils are equal, round, and reactive to light.  Cardiovascular:     Rate and Rhythm: Normal rate and regular rhythm.     Heart sounds: Normal heart sounds.     Comments: Irregular rhythm.  Pulmonary:     Effort: Pulmonary effort is normal. No respiratory  distress.     Breath sounds: No wheezing.  Abdominal:     General: Bowel sounds are normal. There is no distension.  Palpations: Abdomen is soft. There is no mass.     Tenderness: There is no abdominal tenderness.  Musculoskeletal:        General: No deformity. Normal range of motion.     Cervical back: Normal range of motion and neck supple.  Skin:    General: Skin is warm and dry.     Findings: No erythema or rash.  Neurological:     Mental Status: She is alert and oriented to person, place, and time. Mental status is at baseline.     Cranial Nerves: No cranial nerve deficit.     Coordination: Coordination normal.  Psychiatric:        Mood and Affect: Mood normal.      LABORATORY DATA:  I have reviewed the data as listed Lab Results  Component Value Date   WBC 6.6 01/03/2020   HGB 12.5 01/03/2020   HCT 39.9 01/03/2020   MCV 101.5 (H) 01/03/2020   PLT 179 01/03/2020   Recent Labs    01/28/19 1439 01/28/19 1439 02/12/19 1139 02/12/19 1139 06/02/19 1315 06/02/19 1315 07/08/19 1028 07/13/19 1458 07/19/19 1032 09/09/19 1454 11/16/19 1456  NA 138   < > 140   < > 139   < > 139   < > 136 134* 136  K 4.3   < > 4.4   < > 3.7   < > 4.1   < > 4.4 4.4 4.8  CL 106   < > 107   < > 107   < > 102   < > 108 104 106  CO2 20*   < > 21   < > 20*   < > 26   < > 20 17* 19*  GLUCOSE 74   < > 95   < > 85   < > 97   < > 84 99 85  BUN 48*   < > 44*   < > 48*   < > 49*   < > 57* 63* 69*  CREATININE 1.79*   < > 1.61*   < > 1.58*   < > 1.77*   < > 1.58* 1.63* 2.08*  CALCIUM 8.5*   < > 8.8   < > 8.4*   < > 9.9   < > 10.1 9.1 9.5  GFRNONAA 25*  --   --   --  29*  --   --   --   --   --  21*  GFRAA 29*  --   --   --  33*  --   --   --   --   --  24*  PROT  --   --  6.7   < > 6.5   < > 6.9  --  6.2 6.5  --   ALBUMIN  --   --  4.3   < > 3.9  --  4.0  --   --  3.9  --   AST  --   --  12   < > 14*  --  15  --   --  14  --   ALT  --   --  16   < > 41  --  18  --   --  16  --   ALKPHOS  --    --  87   < > 88  --  85  --   --  105  --   BILITOT  --   --  0.5   < > 0.6  --  0.5  --   --  0.4  --   BILIDIR  --   --  0.1  --   --   --  0.2  --   --   --   --    < > = values in this interval not displayed.   Iron/TIBC/Ferritin/ %Sat    Component Value Date/Time   IRON 76 01/03/2020 1302   TIBC 454 (H) 01/03/2020 1302   FERRITIN 30 01/03/2020 1302   IRONPCTSAT 17 01/03/2020 1302     RADIOGRAPHIC STUDIES: I have personally reviewed the radiological images as listed and agreed with the findings in the report. 06/17/2018 Ultrasound abdomen complete Heterogeneous echotexture in the liver.  Extrahepatic and intrahepatic.  Duct dilation, stable since prior study. Small hyperechoic area within the spleen the largest 7 mm.  Compatible with a hemangioma.  Stable Prior cholecystectomy. Mildly increased texture in the kidney suggest chronic kidney disease.  ASSESSMENT & PLAN:  1. Iron deficiency anemia due to chronic blood loss   2. Chronic anticoagulation   3. Macrocytosis    #Iron deficiency anemia, Labs reviewed and discussed with patient. Stable iron store with normal hemoglobin.  No need for additional IV Venofer treatment at this point. Recommend patient to continue multivitamin.  #Macrocytosis, MCV 101.  Recommend patient to take empiric vitamin B12 1000 MCG daily.  I will check a B12 level at the next visit.  #History of autoimmune hepatitis/PB C, continue follow-up with gastroenterology.  Orders Placed This Encounter  Procedures  . CBC with Differential/Platelet    Standing Status:   Future    Standing Expiration Date:   01/04/2021  . Comprehensive metabolic panel    Standing Status:   Future    Standing Expiration Date:   01/04/2021  . Vitamin B12    Standing Status:   Future    Standing Expiration Date:   01/04/2021  . Iron and TIBC    Standing Status:   Future    Standing Expiration Date:   01/04/2021  . Folate    Standing Status:   Future    Standing  Expiration Date:   01/04/2021  . Ferritin    Standing Status:   Future    Standing Expiration Date:   01/04/2021    All questions were answered. The patient knows to call the clinic with any problems questions or concerns. We spent sufficient time to discuss many aspect of care, questions were answered to patient's satisfaction.   Return of visit: 6 months   Earlie Server, MD, PhD Hematology Oncology [facility] Pager- [phone] 01/05/2020

## 2020-01-05 NOTE — Progress Notes (Signed)
Patient is fatigued.

## 2020-01-07 ENCOUNTER — Other Ambulatory Visit: Payer: Self-pay | Admitting: Cardiovascular Disease

## 2020-01-07 NOTE — Telephone Encounter (Signed)
Refill request

## 2020-01-20 DIAGNOSIS — D631 Anemia in chronic kidney disease: Secondary | ICD-10-CM | POA: Diagnosis not present

## 2020-01-20 DIAGNOSIS — I10 Essential (primary) hypertension: Secondary | ICD-10-CM | POA: Diagnosis not present

## 2020-01-20 DIAGNOSIS — N1832 Chronic kidney disease, stage 3b: Secondary | ICD-10-CM | POA: Diagnosis not present

## 2020-01-20 DIAGNOSIS — N2581 Secondary hyperparathyroidism of renal origin: Secondary | ICD-10-CM | POA: Diagnosis not present

## 2020-01-28 ENCOUNTER — Ambulatory Visit (INDEPENDENT_AMBULATORY_CARE_PROVIDER_SITE_OTHER): Payer: Medicare Other | Admitting: Internal Medicine

## 2020-01-28 VITALS — BP 114/70 | HR 88 | Temp 97.4°F | Resp 16 | Ht 63.0 in | Wt 143.8 lb

## 2020-01-28 DIAGNOSIS — I10 Essential (primary) hypertension: Secondary | ICD-10-CM

## 2020-01-28 DIAGNOSIS — E7800 Pure hypercholesterolemia, unspecified: Secondary | ICD-10-CM | POA: Diagnosis not present

## 2020-01-28 DIAGNOSIS — Z0000 Encounter for general adult medical examination without abnormal findings: Secondary | ICD-10-CM

## 2020-01-28 DIAGNOSIS — Z23 Encounter for immunization: Secondary | ICD-10-CM

## 2020-01-28 DIAGNOSIS — I4819 Other persistent atrial fibrillation: Secondary | ICD-10-CM | POA: Diagnosis not present

## 2020-01-28 DIAGNOSIS — I272 Pulmonary hypertension, unspecified: Secondary | ICD-10-CM

## 2020-01-28 DIAGNOSIS — E039 Hypothyroidism, unspecified: Secondary | ICD-10-CM | POA: Diagnosis not present

## 2020-01-28 DIAGNOSIS — I5032 Chronic diastolic (congestive) heart failure: Secondary | ICD-10-CM

## 2020-01-28 DIAGNOSIS — M545 Low back pain, unspecified: Secondary | ICD-10-CM

## 2020-01-28 DIAGNOSIS — K754 Autoimmune hepatitis: Secondary | ICD-10-CM | POA: Diagnosis not present

## 2020-01-28 DIAGNOSIS — K219 Gastro-esophageal reflux disease without esophagitis: Secondary | ICD-10-CM

## 2020-01-28 DIAGNOSIS — K743 Primary biliary cirrhosis: Secondary | ICD-10-CM

## 2020-01-28 DIAGNOSIS — I2510 Atherosclerotic heart disease of native coronary artery without angina pectoris: Secondary | ICD-10-CM | POA: Diagnosis not present

## 2020-01-28 DIAGNOSIS — D509 Iron deficiency anemia, unspecified: Secondary | ICD-10-CM | POA: Diagnosis not present

## 2020-01-28 DIAGNOSIS — N183 Chronic kidney disease, stage 3 unspecified: Secondary | ICD-10-CM

## 2020-01-28 MED ORDER — TRAMADOL HCL 50 MG PO TABS
50.0000 mg | ORAL_TABLET | Freq: Two times a day (BID) | ORAL | 0 refills | Status: DC | PRN
Start: 2020-01-28 — End: 2020-06-01

## 2020-01-28 MED ORDER — GABAPENTIN 300 MG PO CAPS
ORAL_CAPSULE | ORAL | 2 refills | Status: DC
Start: 2020-01-28 — End: 2023-05-23

## 2020-01-28 NOTE — Progress Notes (Signed)
[name], female   DOB: 1929-02-25, 86 y.o.   MRN: [id number]   Subjective:    [name], female    DOB: 07-29-1928, 86 y.o.   MRN: [id number]  HPI This visit occurred during the SARS-CoV-2 public health emergency.  Safety protocols were in place, including screening questions prior to the visit, additional usage of staff PPE, and extensive cleaning of exam room while observing appropriate contact time as indicated for disinfecting solutions.  Patient here for her physical exam. She reports she feels things are relatively stable.  Has a history of chronic diastolic CHF followed by cardiology.  No increased heart rate or palpitations noted.  No evidence of volume overload.  No chest pain.  Breathing stable.  No increased heart rate or palpitations.  Eating.  No nausea or vomiting.  Bowels moving.  Followed by nephrology - kidney function stable.  Has pain - hips/legs.  Sees Dr Jefm Bryant - joint pains.  Plans to f/u with Dr Jefm Bryant to discuss.  Takes tramadol - on averaging one per day.  This helps her to function.  Discussed refill.  Also request refill for gabapentin.    Past Medical History:  Diagnosis Date  . (HFpEF) heart failure with preserved ejection fraction ([place])    a. 07/2017 Echo: EF 50-55%, no rwma, mild AS/AI, mod MR, mod dil LA, mod TR, PASP 21mmHg.  . Autoimmune hepatitis ([place])    followed by Dr [name]  . Fibrocystic breast disease   . Glaucoma   . Hypercholesterolemia   . Hypertension   . Hypothyroidism   . Inflammatory arthritis   . MI (myocardial infarction) ([place])   . Neuropathy   . Non-obstructive Coronary artery disease    a. 05/2014 NSTEMI/Cath: LM nl, LAD mild dzs, LCX 60ost hazy (? culprit), RCA mild dzs. EF 60% by echo-->Med Rx; b. 08/2017 Cath: LM 30ost, LAD min irregs, LCX small, 40ost/p, RCA large, min irregs, RPDA/RPL1 nl, EF 50-55%. 4+MR.  . Osteoarthritis   . Permanent atrial fibrillation (HCC)    a. CHA2DS2VASc = 6-->Xarelto.  .  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 Severe mitral regurgitation    a. 07/2017 Echo: Mod MR; b. 08/2017 Cath: 4+/Severe MR.   Past Surgical History:  Procedure Laterality Date  . ABDOMINAL HYSTERECTOMY  1963   partial, secondary to fibroids  . APPENDECTOMY    . Billroth    . CARDIAC CATHETERIZATION  05/12/14   [facility]  . CARDIOVERSION N/A 12/26/2016   Procedure: CARDIOVERSION;  Surgeon: [name], MD;  Location: [facility] ORS;  Service: Cardiovascular;  Laterality: N/A;  . CARDIOVERSION N/A 05/16/2017   Procedure: CARDIOVERSION;  Surgeon: [name], MD;  Location: [facility];  Service: Cardiovascular;  Laterality: N/A;  . CARDIOVERSION N/A 06/09/2017   Procedure: CARDIOVERSION;  Surgeon: [name], MD;  Location: [facility];  Service: Cardiovascular;  Laterality: N/A;  . CARDIOVERSION N/A 11/30/2018   Procedure: CARDIOVERSION;  Surgeon: [name], MD;  Location: [facility];  Service: Cardiovascular;  Laterality: N/A;  . CHOLECYSTECTOMY  1998  . COLONOSCOPY  2009  . RIGHT/LEFT HEART CATH AND CORONARY ANGIOGRAPHY Bilateral 08/21/2017   Procedure: RIGHT/LEFT HEART CATH AND CORONARY ANGIOGRAPHY;  Surgeon: [name], MD;  Location: [facility];  Service: Cardiovascular;  Laterality: Bilateral;  . TEE WITHOUT CARDIOVERSION N/A 08/25/2017   Procedure: TRANSESOPHAGEAL ECHOCARDIOGRAM (TEE);  Surgeon: Jolaine Artist, MD;  Location: [facility];  Service: Cardiovascular;  Laterality: N/A;  . UPPER GI ENDOSCOPY  2004   Family History  Problem Relation Age of Onset  . Stroke Mother   . Esophageal cancer Brother        also had lung cancer  . Ovarian cancer Daughter   . Colon cancer Daughter   . Breast cancer Neg Hx    Social History   Socioeconomic History  . Marital status: Widowed    Spouse name: Not on file  . Number of children: 2  . Years of education: Not on file  . Highest education level:  Not on file  Occupational History  . Occupation: retired  Tobacco Use  . Smoking status: Never Smoker  . Smokeless tobacco: Never Used  Vaping Use  . Vaping Use: Never used  Substance and Sexual Activity  . Alcohol use: No    Alcohol/week: 0.0 standard drinks  . Drug use: No  . Sexual activity: Never  Other Topics Concern  . Not on file  Social History Narrative  . Not on file   Social Determinants of Health   Financial Resource Strain:   . Difficulty of Paying Living Expenses: Not on file  Food Insecurity:   . Worried About Charity fundraiser in the Last Year: Not on file  . Ran Out of Food in the Last Year: Not on file  Transportation Needs:   . Lack of Transportation (Medical): Not on file  . Lack of Transportation (Non-Medical): Not on file  Physical Activity:   . Days of Exercise per Week: Not on file  . Minutes of Exercise per Session: Not on file  Stress:   . Feeling of Stress : Not on file  Social Connections:   . Frequency of Communication with Friends and Family: Not on file  . Frequency of Social Gatherings with Friends and Family: Not on file  . Attends Religious Services: Not on file  . Active Member of Clubs or Organizations: Not on file  . Attends Archivist Meetings: Not on file  . Marital Status: Not on file    Outpatient Encounter Medications as of 01/28/2020  Medication Sig  . calcitRIOL (ROCALTROL) 0.25 MCG capsule Take 0.25 mcg by mouth daily.   . dorzolamide (TRUSOPT) 2 % ophthalmic solution Place 1 drop into the left eye 2 (two) times daily.  . fluticasone (FLONASE) 50 MCG/ACT nasal spray Place 2 sprays into both nostrils daily as needed for allergies.   Marland Kitchen gabapentin (NEURONTIN) 300 MG capsule One capsule tid and two capsules q hs.  . hydroxychloroquine (PLAQUENIL) 200 MG tablet Take 200 mg by mouth daily.  Marland Kitchen ipratropium (ATROVENT) 0.06 % nasal spray PLACE 1 SPRAY INTO BOTH NOSTRILS DAILY AS NEEDED FOR RHINITIS.  Marland Kitchen latanoprost  (XALATAN) 0.005 % ophthalmic solution Place 1 drop into both eyes at bedtime.   Marland Kitchen levothyroxine (SYNTHROID) 112 MCG tablet Take 112 mcg by mouth daily before breakfast.  . losartan (COZAAR) 25 MG tablet Take 25 mg by mouth daily.   . metoprolol tartrate (LOPRESSOR) 25 MG tablet Take 1 tablet (25 mg total) by mouth 2 (two) times daily.  . metoprolol tartrate (LOPRESSOR) 50 MG tablet Take 1 tablet (50 mg total) by mouth 2 (two) times daily.  . Multiple Vitamin (MULTI-VITAMIN) tablet Take 1 tablet by mouth at bedtime.   . pantoprazole (PROTONIX) 20 MG tablet TAKE 1 TABLET BY MOUTH EVERY DAY  . torsemide (DEMADEX) 20 MG tablet TAKE 1 TABLET  BY MOUTH EVERY DAY  . traMADol (ULTRAM) 50 MG tablet Take 1 tablet (50 mg total) by mouth 2 (two) times daily as needed (for pain.).  Marland Kitchen ursodiol (ACTIGALL) 300 MG capsule Take 300-600 mg by mouth See admin instructions. Take 2 capsules (600 mg) daily in the morning   & 1 capsule (300 mg) at night.  . vitamin B-12 (CYANOCOBALAMIN) 1000 MCG tablet Take 1 tablet (1,000 mcg total) by mouth daily.  Alveda Reasons 15 MG TABS tablet TAKE 1 TABLET (15 MG TOTAL) BY MOUTH DAILY WITH SUPPER.  . [DISCONTINUED] gabapentin (NEURONTIN) 300 MG capsule Take 300 mg by mouth 4 (four) times daily.  . [DISCONTINUED] traMADol (ULTRAM) 50 MG tablet Take 1 tablet (50 mg total) by mouth 2 (two) times daily as needed (for pain.).   No facility-administered encounter medications on file as of 01/28/2020.    Review of Systems  Constitutional: Negative for appetite change and unexpected weight change.  HENT: Negative for congestion, sinus pressure and sore throat.   Eyes: Negative for pain and visual disturbance.  Respiratory: Negative for cough and chest tightness.        Breathing stable.   Cardiovascular: Negative for chest pain, palpitations and leg swelling.  Gastrointestinal: Negative for abdominal pain, diarrhea, nausea and vomiting.  Genitourinary: Negative for difficulty urinating  and dysuria.  Musculoskeletal: Negative for joint swelling.       Knee pain and joint pains as outlined.    Skin: Negative for color change and rash.  Neurological: Negative for dizziness, light-headedness and headaches.  Hematological: Negative for adenopathy. Does not bruise/bleed easily.  Psychiatric/Behavioral: Negative for agitation and dysphoric mood.       Objective:    Physical Exam Vitals reviewed.  Constitutional:      General: She is not in acute distress.    Appearance: Normal appearance. She is well-developed.  HENT:     Head: Normocephalic and atraumatic.     Right Ear: External ear normal.     Left Ear: External ear normal.  Eyes:     General: No scleral icterus.       Right eye: No discharge.        Left eye: No discharge.     Conjunctiva/sclera: Conjunctivae normal.  Neck:     Thyroid: No thyromegaly.  Cardiovascular:     Rate and Rhythm: Normal rate.     Comments: Rate controlled.  Pulmonary:     Effort: No tachypnea, accessory muscle usage or respiratory distress.     Breath sounds: Normal breath sounds. No decreased breath sounds or wheezing.  Chest:     Breasts:        Right: No inverted nipple, mass, nipple discharge or tenderness (no axillary adenopathy).        Left: No inverted nipple, mass, nipple discharge or tenderness (no axilarry adenopathy).  Abdominal:     General: Bowel sounds are normal.     Palpations: Abdomen is soft.     Tenderness: There is no abdominal tenderness.  Musculoskeletal:        General: No swelling or tenderness.     Cervical back: Neck supple. No tenderness.  Lymphadenopathy:     Cervical: No cervical adenopathy.  Skin:    Findings: No erythema or rash.  Neurological:     Mental Status: She is alert and oriented to person, place, and time.  Psychiatric:        Mood and Affect: Mood normal.  Behavior: Behavior normal.     BP 114/70   Pulse 88   Temp (!) 97.4 F (36.3 C) (Oral)   Resp 16   Ht 5\' 3"   (1.6 m)   Wt 143 lb 12.8 oz (65.2 kg)   SpO2 99%   BMI 25.47 kg/m  Wt Readings from Last 3 Encounters:  01/28/20 143 lb 12.8 oz (65.2 kg)  01/05/20 141 lb 11.2 oz (64.3 kg)  11/16/19 138 lb (62.6 kg)     Lab Results  Component Value Date   WBC 6.6 01/03/2020   HGB 12.5 01/03/2020   HCT 39.9 01/03/2020   PLT 179 01/03/2020   GLUCOSE 85 11/16/2019   CHOL 126 07/08/2019   TRIG 124.0 07/08/2019   HDL 44.20 07/08/2019   LDLDIRECT 143.5 12/17/2012   LDLCALC 57 07/08/2019   ALT 16 09/09/2019   AST 14 09/09/2019   NA 136 11/16/2019   K 4.8 11/16/2019   CL 106 11/16/2019   CREATININE 2.08 (H) 11/16/2019   BUN 69 (H) 11/16/2019   CO2 19 (L) 11/16/2019   TSH 2.00 02/12/2019   INR 1.63 12/29/2017   HGBA1C 5.6 01/27/2018    US Abdomen Complete  Result Date: 01/03/2020 CLINICAL DATA:  Hepatitis, cirrhosis EXAM: ABDOMEN ULTRASOUND COMPLETE COMPARISON:  Ultrasound 06/23/2019 FINDINGS: Gallbladder: Surgically absent. Common bile duct: Diameter: 10.2 mm, diminished from prior, likely reflecting combination of post cholecystectomy reservoir effect and senescent change. Liver: Diffuse hepatic heterogeneity with a nodular surface contour likely reflecting stigmata of cirrhosis. No focal liver lesion. Mild intrahepatic biliary prominence, likely reflective of the changes above. Portal vein is patent on color Doppler imaging with normal direction of blood flow towards the liver. IVC: No abnormality visualized. Pancreas: Nonvisualized due to bowel gas. Spleen: Mild heterogeneity, nonspecific. No focal lesion. Normal size. Right Kidney: Length: 8 cm. Simple appearing echogenic cyst arising from the upper pole right kidney measuring 4.0 x 4.1 x 4.3 cm. Additional echogenic, shadowing focus with twinkle artifact in the upper pole right kidney likely to reflect a small 8 mm calculus. Normal cortical echogenicity. No concerning renal mass or hydronephrosis. Left Kidney: Length: 8.3 cm. Echogenicity is  within normal limits. No concerning renal mass, shadowing calculus or hydronephrosis. Abdominal aorta: Echogenic atherosclerotic calcifications are present. Normal distal tapering. No evidence of aneurysm. Other findings: None. IMPRESSION: 1. Diffuse hepatic heterogeneity with a nodular surface contour likely reflecting stigmata of cirrhosis. No focal liver lesion is seen. 2. Simple appearing cyst arising from the upper pole of the right kidney. 3. Nonobstructing 8 mm right upper pole renal calculus. 4. Post cholecystectomy with mild prominence of the common bile duct and intrahepatic biliary ducts, likely reflect combination of post cholecystectomy reservoir effect and senescent change. 5. Mild heterogeneity of the spleen, nonspecific. No focal splenic lesion. 6.  Aortic Atherosclerosis (ICD10-I70.0). Electronically Signed   By: Lovena Le M.D.   On: 01/03/2020 20:29       Assessment & Plan:   Problem List Items Addressed This Visit    Pulmonary hypertension, unspecified ([place])    Followed by cardiology.       Primary biliary cholangitis (HCC)    Followed by GI.  Stable.  Follow liver function tests.        Persistent atrial fibrillation (HCC)    On xarelto and metoprolol.  Rated controlled.  Follow.       Hypothyroidism    On thyroid replacement.  Follow tsh.       Hypercholesteremia  Unable to take statin medication.  Follow lipid panel.       Health care maintenance    Physical today 01/28/20.  Mammogram 09/15/19 - Birads I.  Continues f/u with GI.       GERD (gastroesophageal reflux disease)    Upper symptoms controlled.  On protonix.        Coronary artery disease    Continue risk factor modification.  Continue metoprolol and losartan.        CKD (chronic kidney disease) stage 3, GFR 30-59 ml/min (HCC)    Avoid antiinflammatories.  Followed by nephrology.  Follow metabolic panel.       Chronic diastolic heart failure (HCC)    Followed by cardiology - on metoprolol  and torsemide.  No evidence of volume overload.  Follow metabolic panel.       Benign hypertension    Continue losartan and metoprolol.  Pressures controlled.  Follow pressure.  Follow metabolic panel.       Back pain    Previously saw Dr Sharlet Salina.  Takes tramadol.  Needs this to help her get around.  Stable.  Follow.       Relevant Medications   traMADol (ULTRAM) 50 MG tablet   Autoimmune hepatitis ([place])    Follow liver function tests.        Anemia    Has a history of iron deficiency.  Followed by hematology.        Other Visit Diagnoses    Need for immunization against influenza    -  Primary   Relevant Orders   Flu Vaccine QUAD High Dose(Fluad) (Completed)       Einar Pheasant, MD

## 2020-01-29 ENCOUNTER — Encounter: Payer: Self-pay | Admitting: Internal Medicine

## 2020-01-29 NOTE — Assessment & Plan Note (Signed)
Upper symptoms controlled.  On protonix.  

## 2020-01-29 NOTE — Assessment & Plan Note (Signed)
Follow liver function tests.   

## 2020-01-29 NOTE — Assessment & Plan Note (Signed)
Avoid antiinflammatories.  Followed by nephrology.  Follow metabolic panel.  

## 2020-01-29 NOTE — Assessment & Plan Note (Signed)
Followed by GI.  Stable.  Follow liver function tests.  

## 2020-01-29 NOTE — Assessment & Plan Note (Signed)
On xarelto and metoprolol.  Rated controlled.  Follow.

## 2020-01-29 NOTE — Assessment & Plan Note (Signed)
Followed by cardiology 

## 2020-01-29 NOTE — Assessment & Plan Note (Signed)
On thyroid replacement.  Follow tsh.  

## 2020-01-29 NOTE — Assessment & Plan Note (Signed)
Physical today 01/28/20.  Mammogram 09/15/19 - Birads I.  Continues f/u with GI.

## 2020-01-29 NOTE — Assessment & Plan Note (Signed)
Continue losartan and metoprolol.  Pressures controlled.  Follow pressure.  Follow metabolic panel.

## 2020-01-29 NOTE — Assessment & Plan Note (Signed)
Continue risk factor modification.  Continue metoprolol and losartan.

## 2020-01-29 NOTE — Assessment & Plan Note (Signed)
Unable to take statin medication.  Follow lipid panel.  

## 2020-01-29 NOTE — Assessment & Plan Note (Signed)
Previously saw Dr Sharlet Salina.  Takes tramadol.  Needs this to help her get around.  Stable.  Follow.

## 2020-01-29 NOTE — Assessment & Plan Note (Signed)
Followed by cardiology - on metoprolol and torsemide.  No evidence of volume overload.  Follow metabolic panel.

## 2020-01-29 NOTE — Assessment & Plan Note (Signed)
Has a history of iron deficiency.  Followed by hematology.

## 2020-02-06 ENCOUNTER — Other Ambulatory Visit: Payer: Self-pay | Admitting: Internal Medicine

## 2020-02-08 DIAGNOSIS — M1990 Unspecified osteoarthritis, unspecified site: Secondary | ICD-10-CM | POA: Diagnosis not present

## 2020-02-10 ENCOUNTER — Encounter: Payer: Self-pay | Admitting: Cardiovascular Disease

## 2020-02-10 ENCOUNTER — Ambulatory Visit (INDEPENDENT_AMBULATORY_CARE_PROVIDER_SITE_OTHER): Payer: Medicare Other | Admitting: Cardiovascular Disease

## 2020-02-10 ENCOUNTER — Other Ambulatory Visit: Payer: Self-pay

## 2020-02-10 VITALS — BP 120/78 | HR 102 | Ht 63.0 in | Wt 142.0 lb

## 2020-02-10 DIAGNOSIS — I10 Essential (primary) hypertension: Secondary | ICD-10-CM | POA: Diagnosis not present

## 2020-02-10 DIAGNOSIS — I4819 Other persistent atrial fibrillation: Secondary | ICD-10-CM | POA: Diagnosis not present

## 2020-02-10 DIAGNOSIS — I5033 Acute on chronic diastolic (congestive) heart failure: Secondary | ICD-10-CM | POA: Diagnosis not present

## 2020-02-10 DIAGNOSIS — I2510 Atherosclerotic heart disease of native coronary artery without angina pectoris: Secondary | ICD-10-CM | POA: Diagnosis not present

## 2020-02-10 DIAGNOSIS — I4821 Permanent atrial fibrillation: Secondary | ICD-10-CM | POA: Diagnosis not present

## 2020-02-10 MED ORDER — TORSEMIDE 20 MG PO TABS
40.0000 mg | ORAL_TABLET | Freq: Every day | ORAL | 3 refills | Status: DC
Start: 2020-02-10 — End: 2020-04-06

## 2020-02-10 MED ORDER — METOPROLOL TARTRATE 100 MG PO TABS: 100.0000 mg | ORAL_TABLET | Freq: Two times a day (BID) | ORAL | 3 refills | Status: DC

## 2020-02-10 NOTE — Patient Instructions (Signed)
Medication Instructions:  Please STOP TAKING losartan (COZAAR) 25 MG tablet We have increase your metoprolol tartrate (LOPRESSOR) to 100 MG tablet by mouth twice a day We have increase your torsemide (DEMADEX)  To 40 MG tablet by mouth once a day  *If you need a refill on your cardiac medications before your next appointment, please call your pharmacy*   Lab Work: CBC, CMP, and  BNP have been order today in our clinic  If your tests are completely normal, you will receive your results only by: Marland Kitchen MyChart Message (if you have MyChart) OR . A paper copy in the mail If you have any lab test that is abnormal or we need to change your treatment, we will call you to review the results.   Testing/Procedures: Echocardiogram for acute/chronic diastolic heart failure   Your physician has requested that you have an echocardiogram. Echocardiography is a painless test that uses sound waves to create images of your heart. It provides your doctor with information about the size and shape of your heart and how well your heart's chambers and valves are working. This procedure takes approximately one hour. There are no restrictions for this procedure.  There is a possibility that an IV may need to be started during your test to inject an image enhancing agent. This is done to obtain more optimal pictures of your heart. Therefore we ask that you do at least drink some water prior to coming in to hydrate your veins.     Follow-Up: At [facility], you and your health needs are our priority.  As part of our continuing mission to provide you with exceptional heart care, we have created designated Provider Care Teams.  These Care Teams include your primary Cardiologist (physician) and Advanced Practice Providers (APPs -  Physician Assistants and Nurse Practitioners) who all work together to provide you with the care you need, when you need it.  We recommend signing up for the patient portal called "MyChart".   Sign up information is provided on this After Visit Summary.  MyChart is used to connect with patients for Virtual Visits (Telemedicine).  Patients are able to view lab/test results, encounter notes, upcoming appointments, etc.  Non-urgent messages can be sent to your provider as well.   To learn more about what you can do with MyChart, go to [email or web address].    Your next appointment:   1 month(s)  The format for your next appointment:   In Person  Provider:   You may see [name], MD or one of the following Advanced Practice Providers on your designated Care Team:    Murray Hodgkins, NP  Christell Faith, PA-C  Marrianne Mood, PA-C  Cadence [place], [place]  Laurann Montana, NP    Other Instructions N/A   Echocardiogram An echocardiogram is a procedure that uses painless sound waves (ultrasound) to produce an image of the heart. Images from an echocardiogram can provide important information about:  Signs of coronary artery disease (CAD).  Aneurysm detection. An aneurysm is a weak or damaged part of an artery wall that bulges out from the normal force of blood pumping through the body.  Heart size and shape. Changes in the size or shape of the heart can be associated with certain conditions, including heart failure, aneurysm, and CAD.  Heart muscle function.  Heart valve function.  Signs of a past heart attack.  Fluid buildup around the heart.  Thickening of the heart muscle.  A tumor or infectious growth around the  heart valves. Tell a health care provider about:  Any allergies you have.  All medicines you are taking, including vitamins, herbs, eye drops, creams, and over-the-counter medicines.  Any blood disorders you have.  Any surgeries you have had.  Any medical conditions you have.  Whether you are pregnant or may be pregnant. What are the risks? Generally, this is a safe procedure. However, problems may occur, including:  Allergic reaction  to dye (contrast) that may be used during the procedure. What happens before the procedure? No specific preparation is needed. You may eat and drink normally. What happens during the procedure?   An IV tube may be inserted into one of your veins.  You may receive contrast through this tube. A contrast is an injection that improves the quality of the pictures from your heart.  A gel will be applied to your chest.  A wand-like tool (transducer) will be moved over your chest. The gel will help to transmit the sound waves from the transducer.  The sound waves will harmlessly bounce off of your heart to allow the heart images to be captured in real-time motion. The images will be recorded on a computer. The procedure may vary among health care providers and hospitals. What happens after the procedure?  You may return to your normal, everyday life, including diet, activities, and medicines, unless your health care provider tells you not to do that. Summary  An echocardiogram is a procedure that uses painless sound waves (ultrasound) to produce an image of the heart.  Images from an echocardiogram can provide important information about the size and shape of your heart, heart muscle function, heart valve function, and fluid buildup around your heart.  You do not need to do anything to prepare before this procedure. You may eat and drink normally.  After the echocardiogram is completed, you may return to your normal, everyday life, unless your health care provider tells you not to do that. This information is not intended to replace advice given to you by your health care provider. Make sure you discuss any questions you have with your health care provider. Document Revised: 06/18/2018 Document Reviewed: 03/30/2016 Elsevier Patient Education  [place].

## 2020-02-10 NOTE — Progress Notes (Signed)
Cardiology Office Note   Date:  02/10/2020   ID:  Ruth Roth, DOB 1928/06/20, MRN [id number]  PCP:  Einar Pheasant, MD  Cardiologist:   [name], MD   Chief Complaint  Patient presents with  . office visit    6 month F/U-Patient reports DOE, fatigue, and weakness. Symptoms began about one week ago; Meds verbally reviewed with patient.      History of Present Illness: Ruth Roth is a 86 y.o. female who presents for a follow-up visit regarding coronary artery disease, chronic diastolic heart failure, mitral regurgitation and persistent atrial fibrillation .  She has known history of hypertension, peptic ulcer disease, fibrocystic breast disease, inflammatory arthritis, autoimmune hepatitis and neuropathy.  She had a small non-ST elevation myocardial infarction in March 2016 with acute diastolic heart failure after a GI illness.  Echocardiogram showed normal LV systolic function. Cardiac catheterization showed showed 60% ostial left circumflex hazy stenosis which was possibly the culprit. Mild LAD/RCA disease. She was treated medically.   She had worsening heart failure 2019.  Right and left cardiac catheterization in June 2019 showed  showed mild nonobstructive coronary artery disease.  Ejection fraction was 50 to 55% with severe mitral regurgitation.  Right heart catheterization showed severely elevated filling pressures with moderate to severe pulmonary hypertension and severely reduced cardiac output. I transferred the patient to [facility] where she was effectively diuresed with milrinone with subsequent improvement in symptoms.  She had a transesophageal echocardiogram done which showed normal LV systolic function with only mild mitral regurgitation.  The mitral regurgitation was felt to be functional with subsequent improvement after diuresis. Atrial fibrillation is currently being treated with rate control due to symptomatic maintaining sinus rhythm.    She reports significant  worsening of symptoms over the last week with increased shortness of breath, palpitations and giving out very easily.  Her weight is up 2 pounds.  She does have underlying chronic kidney disease.  She has been taking her medications regularly.   Past Medical History:  Diagnosis Date  . (HFpEF) heart failure with preserved ejection fraction ([place])    a. 07/2017 Echo: EF 50-55%, no rwma, mild AS/AI, mod MR, mod dil LA, mod TR, PASP 32mmHg.  . Autoimmune hepatitis ([place])    followed by Dr [name]  . Fibrocystic breast disease   . Glaucoma   . Hypercholesterolemia   . Hypertension   . Hypothyroidism   . Inflammatory arthritis   . MI (myocardial infarction) ([place])   . Neuropathy   . Non-obstructive Coronary artery disease    a. 05/2014 NSTEMI/Cath: LM nl, LAD mild dzs, LCX 60ost hazy (? culprit), RCA mild dzs. EF 60% by echo-->Med Rx; b. 08/2017 Cath: LM 30ost, LAD min irregs, LCX small, 40ost/p, RCA large, min irregs, RPDA/RPL1 nl, EF 50-55%. 4+MR.  . Osteoarthritis   . Permanent atrial fibrillation (HCC)    a. CHA2DS2VASc = 6-->Xarelto.  . 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 Severe mitral regurgitation    a. 07/2017 Echo: Mod MR; b. 08/2017 Cath: 4+/Severe MR.    Past Surgical History:  Procedure Laterality Date  . ABDOMINAL HYSTERECTOMY  1963   partial, secondary to fibroids  . APPENDECTOMY    . Billroth    . CARDIAC CATHETERIZATION  05/12/14   [facility]  . CARDIOVERSION N/A 12/26/2016   Procedure: CARDIOVERSION;  Surgeon: [name], MD;  Location: [facility] ORS;  Service: Cardiovascular;  Laterality:  N/A;  . CARDIOVERSION N/A 05/16/2017   Procedure: CARDIOVERSION;  Surgeon: [name], MD;  Location: [facility] ORS;  Service: Cardiovascular;  Laterality: N/A;  . CARDIOVERSION N/A 06/09/2017   Procedure: CARDIOVERSION;  Surgeon: [name], MD;  Location: [facility] ORS;  Service: Cardiovascular;  Laterality: N/A;  . CARDIOVERSION  N/A 11/30/2018   Procedure: CARDIOVERSION;  Surgeon: [name], MD;  Location: [facility] ORS;  Service: Cardiovascular;  Laterality: N/A;  . CHOLECYSTECTOMY  1998  . COLONOSCOPY  2009  . RIGHT/LEFT HEART CATH AND CORONARY ANGIOGRAPHY Bilateral 08/21/2017   Procedure: RIGHT/LEFT HEART CATH AND CORONARY ANGIOGRAPHY;  Surgeon: [name], MD;  Location: [place] CV LAB;  Service: Cardiovascular;  Laterality: Bilateral;  . TEE WITHOUT CARDIOVERSION N/A 08/25/2017   Procedure: TRANSESOPHAGEAL ECHOCARDIOGRAM (TEE);  Surgeon: Jolaine Artist, MD;  Location: [facility] ENDOSCOPY;  Service: Cardiovascular;  Laterality: N/A;  . UPPER GI ENDOSCOPY  2004     Current Outpatient Medications  Medication Sig Dispense Refill  . calcitRIOL (ROCALTROL) 0.25 MCG capsule Take 0.25 mcg by mouth daily.     . dorzolamide (TRUSOPT) 2 % ophthalmic solution Place 1 drop into the left eye 2 (two) times daily.    . fluticasone (FLONASE) 50 MCG/ACT nasal spray Place 2 sprays into both nostrils daily as needed for allergies.     Marland Kitchen gabapentin (NEURONTIN) 300 MG capsule One capsule tid and two capsules q hs. 150 capsule 2  . ipratropium (ATROVENT) 0.06 % nasal spray PLACE 1 SPRAY INTO BOTH NOSTRILS DAILY AS NEEDED FOR RHINITIS. 15 mL 1  . latanoprost (XALATAN) 0.005 % ophthalmic solution Place 1 drop into both eyes at bedtime.     Marland Kitchen levothyroxine (SYNTHROID) 112 MCG tablet Take 112 mcg by mouth daily before breakfast.    . losartan (COZAAR) 25 MG tablet Take 25 mg by mouth daily.     . metoprolol tartrate (LOPRESSOR) 25 MG tablet Take 1 tablet (25 mg total) by mouth 2 (two) times daily. 180 tablet 2  . metoprolol tartrate (LOPRESSOR) 50 MG tablet Take 1 tablet (50 mg total) by mouth 2 (two) times daily. 180 tablet 2  . Multiple Vitamin (MULTI-VITAMIN) tablet Take 1 tablet by mouth at bedtime.     . pantoprazole (PROTONIX) 20 MG tablet TAKE 1 TABLET BY MOUTH EVERY DAY 90 tablet 1  . torsemide (DEMADEX) 20 MG tablet  TAKE 1 TABLET BY MOUTH EVERY DAY 90 tablet 3  . traMADol (ULTRAM) 50 MG tablet Take 1 tablet (50 mg total) by mouth 2 (two) times daily as needed (for pain.). 60 tablet 0  . ursodiol (ACTIGALL) 300 MG capsule Take 300-600 mg by mouth See admin instructions. Take 2 capsules (600 mg) daily in the morning   & 1 capsule (300 mg) at night.    . vitamin B-12 (CYANOCOBALAMIN) 1000 MCG tablet Take 1 tablet (1,000 mcg total) by mouth daily. 30 tablet 3  . XARELTO 15 MG TABS tablet TAKE 1 TABLET (15 MG TOTAL) BY MOUTH DAILY WITH SUPPER. 30 tablet 6   No current facility-administered medications for this visit.    Allergies:   Statins, Haldol [haloperidol lactate], Librax [chlordiazepoxide-clidinium], Plavix [clopidogrel bisulfate], and Ramipril    Social History:  The patient  reports that she has never smoked. She has never used smokeless tobacco. She reports that she does not drink alcohol and does not use drugs.   Family History:  The patient's family history includes Colon cancer in her daughter; Esophageal cancer in  her brother; Ovarian cancer in her daughter; Stroke in her mother.    ROS:  Please see the history of present illness.   Otherwise, review of systems are positive for none.   All other systems are reviewed and negative.    PHYSICAL EXAM: VS:  BP 120/78 (BP Location: Left Arm, Patient Position: Sitting, Cuff Size: Normal)   Pulse (!) 102   Ht 5\' 3"  (1.6 m)   Wt 142 lb (64.4 kg)   SpO2 94%   BMI 25.15 kg/m  , BMI Body mass index is 25.15 kg/m. GEN: Well nourished, well developed, in no acute distress  HEENT: normal  Neck: No JVD, carotid bruits, or masses Cardiac: Irregularly irregular and mildly tachycardic; no rubs, or gallops.  Mild bilateral leg edema. 1/ 6 systolic ejection murmur at the aortic area.  Mild bilateral leg edema Respiratory: Clear to auscultation GI: soft, nontender, nondistended, + BS MS: no deformity or atrophy  Skin: warm and dry, no rash Neuro:   Strength and sensation are intact Psych: euthymic mood, full affect   EKG:  EKG is ordered today. The ekg ordered today demonstrates atrial fibrillation with ventricular rate of 102 bpm.   Recent Labs: 02/12/2019: TSH 2.00 09/09/2019: ALT 16 11/16/2019: B Natriuretic Peptide 511.5; BUN 69; Creatinine, Ser 2.08; Potassium 4.8; Sodium 136 01/03/2020: Hemoglobin 12.5; Platelets 179    Lipid Panel    Component Value Date/Time   CHOL 126 07/08/2019 1028   TRIG 124.0 07/08/2019 1028   HDL 44.20 07/08/2019 1028   CHOLHDL 3 07/08/2019 1028   VLDL 24.8 07/08/2019 1028   LDLCALC 57 07/08/2019 1028   LDLDIRECT 143.5 12/17/2012 0955      Wt Readings from Last 3 Encounters:  02/10/20 142 lb (64.4 kg)  01/28/20 143 lb 12.8 oz (65.2 kg)  01/05/20 141 lb 11.2 oz (64.3 kg)       ASSESSMENT AND PLAN:  1.    Permanent atrial fibrillation: Ventricular rate is not optimally controlled.  I elected to increase metoprolol to 100 mg twice daily.  Continue anticoagulation with Xarelto 15 mg daily  2.  Acute on chronic diastolic heart failure:  Worsening fatigue, shortness of breath and leg edema over the last week.  Her weight is close to her baseline but she appears to be mildly volume overloaded.  I am going to obtain routine labs.  We will repeat echocardiogram.  I increase torsemide to 40 mg once daily  3. Essential hypertension: I elected to discontinue losartan given up titration of metoprolol.  4.  Functional mitral regurgitation: Severe MR was only in the setting of severe volume overload.    Will check this with echocardiogram.  5.  Coronary artery disease involving native coronary arteries without angina: Continue medical therapy.   Disposition:   FU with me in 1 month.  Signed,  [name], MD  02/10/2020 3:18 PM    [place]

## 2020-02-11 ENCOUNTER — Telehealth (HOSPITAL_COMMUNITY): Payer: Self-pay | Admitting: Internal Medicine

## 2020-02-11 LAB — COMPREHENSIVE METABOLIC PANEL
ALT: 29 IU/L (ref 0–32)
AST: 11 IU/L (ref 0–40)
Albumin/Globulin Ratio: 2.1 (ref 1.2–2.2)
Albumin: 4.4 g/dL (ref 3.6–4.6)
Alkaline Phosphatase: 92 IU/L (ref 44–121)
BUN/Creatinine Ratio: 28 (ref 12–28)
BUN: 59 mg/dL — ABNORMAL HIGH (ref 8–27)
Bilirubin Total: 0.3 mg/dL (ref 0.0–1.2)
CO2: 18 mmol/L — ABNORMAL LOW (ref 20–29)
Calcium: 9.2 mg/dL (ref 8.7–10.3)
Chloride: 109 mmol/L — ABNORMAL HIGH (ref 96–106)
Creatinine, Ser: 2.1 mg/dL — ABNORMAL HIGH (ref 0.57–1.00)
GFR calc Af Amer: 24 mL/min/{1.73_m2} — ABNORMAL LOW (ref >59)
GFR calc non Af Amer: 20 mL/min/{1.73_m2} — ABNORMAL LOW (ref >59)
Globulin, Total: 2.1 g/dL (ref 1.5–4.5)
Glucose: 128 mg/dL — ABNORMAL HIGH (ref 65–99)
Potassium: 4.8 mmol/L (ref 3.5–5.2)
Sodium: 143 mmol/L (ref 134–144)
Total Protein: 6.5 g/dL (ref 6.0–8.5)

## 2020-02-11 LAB — CBC
Hematocrit: 35.2 % (ref 34.0–46.6)
Hemoglobin: 11.9 g/dL (ref 11.1–15.9)
MCH: 32.3 pg (ref 26.6–33.0)
MCHC: 33.8 g/dL (ref 31.5–35.7)
MCV: 96 fL (ref 79–97)
Platelets: 189 10*3/uL (ref 150–450)
RBC: 3.68 x10E6/uL — ABNORMAL LOW (ref 3.77–5.28)
RDW: 14.3 % (ref 11.7–15.4)
WBC: 5 10*3/uL (ref 3.4–10.8)

## 2020-02-11 LAB — BRAIN NATRIURETIC PEPTIDE: BNP: 539.8 pg/mL — ABNORMAL HIGH (ref 0.0–100.0)

## 2020-02-15 DIAGNOSIS — M25561 Pain in right knee: Secondary | ICD-10-CM | POA: Diagnosis not present

## 2020-02-15 DIAGNOSIS — N1832 Chronic kidney disease, stage 3b: Secondary | ICD-10-CM | POA: Diagnosis not present

## 2020-02-15 DIAGNOSIS — K7460 Unspecified cirrhosis of liver: Secondary | ICD-10-CM | POA: Diagnosis not present

## 2020-02-15 DIAGNOSIS — M1990 Unspecified osteoarthritis, unspecified site: Secondary | ICD-10-CM | POA: Diagnosis not present

## 2020-02-15 DIAGNOSIS — E790 Hyperuricemia without signs of inflammatory arthritis and tophaceous disease: Secondary | ICD-10-CM | POA: Diagnosis not present

## 2020-02-23 ENCOUNTER — Telehealth: Payer: Self-pay | Admitting: Cardiovascular Disease

## 2020-02-23 NOTE — Telephone Encounter (Signed)
Please call to discuss application for Xarelto.

## 2020-02-23 NOTE — Telephone Encounter (Signed)
Filled out assistance form MD portion and placed in Dr. Tyrell Antonio office to be signed.

## 2020-02-23 NOTE — Telephone Encounter (Signed)
Spoke with Desiree, they are asking if MD's portion of pt assistance form has been completed. We can fax to her when completed and they will send for pt. They have pt's portion ready.

## 2020-02-24 NOTE — Telephone Encounter (Signed)
Confirmed the patients name and dob and refaxed. Fax confirmation received.

## 2020-02-24 NOTE — Telephone Encounter (Signed)
Mrs Britta Mccreedy calling to discuss forms.  She received the signed form but it did not have this patient name on the form.  Please resend

## 2020-03-07 ENCOUNTER — Other Ambulatory Visit: Payer: Medicare Other

## 2020-03-08 ENCOUNTER — Other Ambulatory Visit: Payer: Self-pay

## 2020-03-08 ENCOUNTER — Ambulatory Visit (INDEPENDENT_AMBULATORY_CARE_PROVIDER_SITE_OTHER): Payer: Medicare Other

## 2020-03-08 DIAGNOSIS — I5033 Acute on chronic diastolic (congestive) heart failure: Secondary | ICD-10-CM

## 2020-03-08 DIAGNOSIS — I4821 Permanent atrial fibrillation: Secondary | ICD-10-CM

## 2020-03-08 LAB — ECHOCARDIOGRAM COMPLETE
AR max vel: 0.84 cm2
AV Area VTI: 0.74 cm2
AV Area mean vel: 0.78 cm2
AV Mean grad: 5.3 mmHg
AV Peak grad: 9.2 mmHg
Ao pk vel: 1.51 m/s
Calc EF: 46.1 %
P 1/2 time: 372 msec
S' Lateral: 2 cm
Single Plane A2C EF: 47.6 %
Single Plane A4C EF: 47 %

## 2020-03-15 ENCOUNTER — Telehealth: Payer: Self-pay

## 2020-03-15 NOTE — Telephone Encounter (Addendum)
Patient made aware of echo results.  Patient reduced herTorsemide on her own to 20 mg daily 2-3 weeks ago. She says that it was causing her to urinate to frequently. Discussed with her the importance of medication adherence and the reason for the dosage of her torsemide being increased to help keep her from retaining fluid.  She has not been weighing herself. She sts that she checks her BP and HR once a week. I asked her to provide her most recent readings. She is not sure of the exact number "117/77 75 bpm". She is not able to check her O2, since her pulse ox at home needs batteries.  She reports that her sob and swelling is stable.  Adv the patient that I will fwd the update to Dr. Fletcher Anon and call back with his recommendation.  Marland Kitchen

## 2020-03-15 NOTE — Telephone Encounter (Signed)
-----   Message from [name], MD sent at 03/15/2020 11:21 AM EST ----- Inform patient that echo showed normal ejection fraction with moderate mitral regurgitation. These findings are overall stable. However, her pulmonary pressure is severely elevated indicating significant volume overload. We already increased the dose of torsemide to 40 mg once daily. Please get an update about the patient's symptoms, her weight and vital signs.

## 2020-03-16 ENCOUNTER — Other Ambulatory Visit
Admission: RE | Admit: 2020-03-16 | Discharge: 2020-03-16 | Disposition: A | Discharge status: Home / Self Care | Payer: Medicare Other | Source: Ambulatory Visit | Attending: Rheumatology | Admitting: Rheumatology

## 2020-03-16 DIAGNOSIS — M25561 Pain in right knee: Secondary | ICD-10-CM | POA: Diagnosis not present

## 2020-03-16 LAB — SYNOVIAL CELL COUNT + DIFF, W/ CRYSTALS
Crystals, Fluid: NONE SEEN
Eosinophils-Synovial: 0 %
Lymphocytes-Synovial Fld: 93 %
Monocyte-Macrophage-Synovial Fluid: 5 %
Neutrophil, Synovial: 2 %
WBC, Synovial: 1646 /mm3 — ABNORMAL HIGH (ref 0–200)

## 2020-03-17 NOTE — Telephone Encounter (Signed)
Based on the echo results she should take torsemide 40 mg once daily or if she prefers 20 mg twice daily.

## 2020-03-20 NOTE — Telephone Encounter (Signed)
Spoke with the patient. Patient made aware of Dr. Tyrell Antonio response and recommendation.  Patient verbalized understanding.

## 2020-03-20 NOTE — Telephone Encounter (Addendum)
Called to give the patient Dr. Arida's recommendation. [email or web address]. 

## 2020-03-29 ENCOUNTER — Encounter (HOSPITAL_COMMUNITY): Payer: Medicare Other | Admitting: Internal Medicine

## 2020-04-05 ENCOUNTER — Encounter (HOSPITAL_COMMUNITY): Payer: Self-pay | Admitting: Internal Medicine

## 2020-04-05 ENCOUNTER — Other Ambulatory Visit: Payer: Self-pay

## 2020-04-05 ENCOUNTER — Ambulatory Visit (HOSPITAL_COMMUNITY)
Admission: RE | Admit: 2020-04-05 | Discharge: 2020-04-05 | Disposition: A | Discharge status: Home / Self Care | Payer: Medicare Other | Source: Ambulatory Visit | Attending: Internal Medicine | Admitting: Internal Medicine

## 2020-04-05 VITALS — BP 118/80 | HR 84 | Wt 138.8 lb

## 2020-04-05 DIAGNOSIS — I5022 Chronic systolic (congestive) heart failure: Secondary | ICD-10-CM | POA: Diagnosis not present

## 2020-04-05 DIAGNOSIS — Z888 Allergy status to other drugs, medicaments and biological substances status: Secondary | ICD-10-CM | POA: Insufficient documentation

## 2020-04-05 DIAGNOSIS — I130 Hypertensive heart and chronic kidney disease with heart failure and stage 1 through stage 4 chronic kidney disease, or unspecified chronic kidney disease: Secondary | ICD-10-CM | POA: Insufficient documentation

## 2020-04-05 DIAGNOSIS — I252 Old myocardial infarction: Secondary | ICD-10-CM | POA: Insufficient documentation

## 2020-04-05 DIAGNOSIS — I4821 Permanent atrial fibrillation: Secondary | ICD-10-CM | POA: Diagnosis not present

## 2020-04-05 DIAGNOSIS — N184 Chronic kidney disease, stage 4 (severe): Secondary | ICD-10-CM | POA: Diagnosis not present

## 2020-04-05 DIAGNOSIS — Z7901 Long term (current) use of anticoagulants: Secondary | ICD-10-CM | POA: Diagnosis not present

## 2020-04-05 DIAGNOSIS — Z79899 Other long term (current) drug therapy: Secondary | ICD-10-CM | POA: Insufficient documentation

## 2020-04-05 DIAGNOSIS — I5032 Chronic diastolic (congestive) heart failure: Secondary | ICD-10-CM

## 2020-04-05 DIAGNOSIS — I509 Heart failure, unspecified: Secondary | ICD-10-CM | POA: Diagnosis not present

## 2020-04-05 DIAGNOSIS — I2510 Atherosclerotic heart disease of native coronary artery without angina pectoris: Secondary | ICD-10-CM | POA: Insufficient documentation

## 2020-04-05 DIAGNOSIS — R0600 Dyspnea, unspecified: Secondary | ICD-10-CM | POA: Diagnosis not present

## 2020-04-05 DIAGNOSIS — I340 Nonrheumatic mitral (valve) insufficiency: Secondary | ICD-10-CM | POA: Insufficient documentation

## 2020-04-05 LAB — COMPREHENSIVE METABOLIC PANEL
ALT: 19 U/L (ref 0–44)
AST: 18 U/L (ref 15–41)
Albumin: 4 g/dL (ref 3.5–5.0)
Alkaline Phosphatase: 69 U/L (ref 38–126)
Anion gap: 12 (ref 5–15)
BUN: 92 mg/dL — ABNORMAL HIGH (ref 8–23)
CO2: 25 mmol/L (ref 22–32)
Calcium: 9.3 mg/dL (ref 8.9–10.3)
Chloride: 101 mmol/L (ref 98–111)
Creatinine, Ser: 1.8 mg/dL — ABNORMAL HIGH (ref 0.44–1.00)
GFR, Estimated: 27 mL/min — ABNORMAL LOW (ref >60)
Glucose, Bld: 97 mg/dL (ref 70–99)
Potassium: 4 mmol/L (ref 3.5–5.1)
Sodium: 138 mmol/L (ref 135–145)
Total Bilirubin: 0.6 mg/dL (ref 0.3–1.2)
Total Protein: 6.8 g/dL (ref 6.5–8.1)

## 2020-04-05 LAB — CBC
HCT: 40.7 % (ref 36.0–46.0)
Hemoglobin: 13.3 g/dL (ref 12.0–15.0)
MCH: 33.3 pg (ref 26.0–34.0)
MCHC: 32.7 g/dL (ref 30.0–36.0)
MCV: 102 fL — ABNORMAL HIGH (ref 80.0–100.0)
Platelets: 157 10*3/uL (ref 150–400)
RBC: 3.99 MIL/uL (ref 3.87–5.11)
RDW: 14 % (ref 11.5–15.5)
WBC: 5.4 10*3/uL (ref 4.0–10.5)
nRBC: 0 % (ref 0.0–0.2)

## 2020-04-05 LAB — BRAIN NATRIURETIC PEPTIDE: B Natriuretic Peptide: 754 pg/mL — ABNORMAL HIGH (ref 0.0–100.0)

## 2020-04-05 NOTE — Progress Notes (Signed)
Advanced Heart Failure Clinic Note   Date:  04/05/2020   ID:  Ruth Roth, DOB [birth year], MRN [id number]  Location: Home  Provider location: [place] [facility] Type of Visit: Established patient  PCP:  Einar Pheasant, MD  Cardiologist:  [name], MD Primary HF: [place] Nephrology: Dr. Holley Raring  Chief Complaint: Heart Failure follow-up   History of Present Illness:  Ruth Roth is a 87 y.o. female with a history of CAD, chronic diastolic HF with severe mitral reguritationm, chronic atrial fibrillation, hypertension, peptic ulcer disease.  Admitted [facility] 08/21/17 s/p R/LHC which showed significantly elevated filling pressures, severe MR, and low cardiac output, and then transferred to [facility] 6/14 for consideration of MitraClip. AKI noted on admission felt to be due to cardiorenal syndrome. She required milrinone and lasix for diuresis. Diuresed 25 lbs and transitioned to 20 mg torsemide daily. Case and images reviewed at length with structural heart team. MR felt to be truly functional with improvement from hemodynamic optimization. Not felt to be MitraClip candidate.Discharge weight: 121 lbs.   In 2020 had recurrent AF with RVR Amio 200 bid started. Underwent DC-CV on 9/210/20. PCP cut back torsemide to 20 qOD due to AKI but developed volume overload. Torsemide increased back to 20 daily. Developed recurrent AF and Amio stopped. Saw Dr. Fletcher Anon in 04/30/19 and metoprolol increased to 75 bid for better rate control. Says she felt better on the 50 but is tolerating this ok.  Here for routine f/u. Says she is doing pretty good. Still dyspneic with mild to moderate exertion. Can do all ADLs and go to the store. Has bad arthritis in her knees. No edema, orthopnea or PND. No bleeding with Xarelto.   Echo 12/21 EF 55-60% Moderate RV HK. Personally reviewed   Cardiac Studies: Echo 10/09/17: EF 55-60%, no AI, mild to mod MR, LA moderately dilated, RA mildly dilated,  RV normal, PA peak pressure 52 mmHg  [facility] 08/21/2017 - Mild non-obstructive CAD RHC Procedural Findings:(Obtained from Scan 08/21/17 at 1030) Hemodynamics (mmHg) RA mean17 RV68/11 (19) PA62/27 (44) PCWP41 AO147/67 (102) Cardiac Output(Fick) 2.45 Cardiac Index(Fick) 1.46 PVR 1.23 WU  Echo 08/01/17 LVEF 50-55%, Mild AS, Mod MR, Mod LAE, Mild RAE, Mod TR, PA peak pressure 65 mm Hg.MR ++ calcified, and RV mild/moderately reduced function.      Past Medical History:  Diagnosis Date  . (HFpEF) heart failure with preserved ejection fraction ([place])    a. 07/2017 Echo: EF 50-55%, no rwma, mild AS/AI, mod MR, mod dil LA, mod TR, PASP 78mmHg.  . Autoimmune hepatitis ([place])    followed by Dr [name]  . Fibrocystic breast disease   . Glaucoma   . Hypercholesterolemia   . Hypertension   . Hypothyroidism   . Inflammatory arthritis   . MI (myocardial infarction) ([place])   . Neuropathy   . Non-obstructive Coronary artery disease    a. 05/2014 NSTEMI/Cath: LM nl, LAD mild dzs, LCX 60ost hazy (? culprit), RCA mild dzs. EF 60% by echo-->Med Rx; b. 08/2017 Cath: LM 30ost, LAD min irregs, LCX small, 40ost/p, RCA large, min irregs, RPDA/RPL1 nl, EF 50-55%. 4+MR.  . Osteoarthritis   . Permanent atrial fibrillation (HCC)    a. CHA2DS2VASc = 6-->Xarelto.  . 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 Severe mitral regurgitation    a. 07/2017 Echo: Mod MR; b. 08/2017 Cath: 4+/Severe MR.   Past Surgical History:  Procedure  Laterality Date  . ABDOMINAL HYSTERECTOMY  1963   partial, secondary to fibroids  . APPENDECTOMY    . Billroth    . CARDIAC CATHETERIZATION  05/12/14   [facility]  . CARDIOVERSION N/A 12/26/2016   Procedure: CARDIOVERSION;  Surgeon: [name], MD;  Location: [facility] ORS;  Service: Cardiovascular;  Laterality: N/A;  . CARDIOVERSION N/A 05/16/2017   Procedure: CARDIOVERSION;  Surgeon: [name], MD;  Location: [facility]  ORS;  Service: Cardiovascular;  Laterality: N/A;  . CARDIOVERSION N/A 06/09/2017   Procedure: CARDIOVERSION;  Surgeon: [name], MD;  Location: [facility] ORS;  Service: Cardiovascular;  Laterality: N/A;  . CARDIOVERSION N/A 11/30/2018   Procedure: CARDIOVERSION;  Surgeon: [name], MD;  Location: [facility] ORS;  Service: Cardiovascular;  Laterality: N/A;  . CHOLECYSTECTOMY  1998  . COLONOSCOPY  2009  . RIGHT/LEFT HEART CATH AND CORONARY ANGIOGRAPHY Bilateral 08/21/2017   Procedure: RIGHT/LEFT HEART CATH AND CORONARY ANGIOGRAPHY;  Surgeon: [name], MD;  Location: [place] CV LAB;  Service: Cardiovascular;  Laterality: Bilateral;  . TEE WITHOUT CARDIOVERSION N/A 08/25/2017   Procedure: TRANSESOPHAGEAL ECHOCARDIOGRAM (TEE);  Surgeon: Jolaine Artist, MD;  Location: [facility] ENDOSCOPY;  Service: Cardiovascular;  Laterality: N/A;  . UPPER GI ENDOSCOPY  2004     Current Outpatient Medications  Medication Sig Dispense Refill  . calcitRIOL (ROCALTROL) 0.25 MCG capsule Take 0.25 mcg by mouth daily.     . dorzolamide (TRUSOPT) 2 % ophthalmic solution Place 1 drop into the left eye 2 (two) times daily.    . fluticasone (FLONASE) 50 MCG/ACT nasal spray Place 2 sprays into both nostrils daily as needed for allergies.     Marland Kitchen gabapentin (NEURONTIN) 300 MG capsule One capsule tid and two capsules q hs. 150 capsule 2  . hydroxychloroquine (PLAQUENIL) 200 MG tablet Take 1 tablet by mouth daily.    Marland Kitchen ipratropium (ATROVENT) 0.06 % nasal spray PLACE 1 SPRAY INTO BOTH NOSTRILS DAILY AS NEEDED FOR RHINITIS. 15 mL 1  . latanoprost (XALATAN) 0.005 % ophthalmic solution Place 1 drop into both eyes at bedtime.     Marland Kitchen levothyroxine (SYNTHROID) 112 MCG tablet Take 112 mcg by mouth daily before breakfast.    . losartan (COZAAR) 25 MG tablet Take 1 tablet by mouth daily.    . metoprolol tartrate (LOPRESSOR) 100 MG tablet Take 1 tablet (100 mg total) by mouth 2 (two) times daily. 180 tablet 3  . Multiple  Vitamin (MULTI-VITAMIN) tablet Take 1 tablet by mouth at bedtime.     . pantoprazole (PROTONIX) 20 MG tablet TAKE 1 TABLET BY MOUTH EVERY DAY 90 tablet 1  . torsemide (DEMADEX) 20 MG tablet Take 2 tablets (40 mg total) by mouth daily. 180 tablet 3  . traMADol (ULTRAM) 50 MG tablet Take 1 tablet (50 mg total) by mouth 2 (two) times daily as needed (for pain.). 60 tablet 0  . ursodiol (ACTIGALL) 300 MG capsule Take 300-600 mg by mouth See admin instructions. Take 2 capsules (600 mg) daily in the morning   & 1 capsule (300 mg) at night.    . vitamin B-12 (CYANOCOBALAMIN) 1000 MCG tablet Take 1 tablet (1,000 mcg total) by mouth daily. 30 tablet 3  . XARELTO 15 MG TABS tablet TAKE 1 TABLET (15 MG TOTAL) BY MOUTH DAILY WITH SUPPER. 30 tablet 6   No current facility-administered medications for this encounter.    Allergies:   Statins, Haldol [haloperidol lactate], Librax [chlordiazepoxide-clidinium], Plavix [clopidogrel bisulfate], and Ramipril  Social History:  The patient  reports that she has never smoked. She has never used smokeless tobacco. She reports that she does not drink alcohol and does not use drugs.   Family History:  The patient's family history includes Colon cancer in her daughter; Esophageal cancer in her brother; Ovarian cancer in her daughter; Stroke in her mother.   ROS:  Please see the history of present illness.   All other systems are personally reviewed and negative.   Vitals:   04/05/20 1350  BP: 118/80  Pulse: 84  SpO2: 99%  Weight: 63 kg (138 lb 12.8 oz)    Exam:   General:  Elderly. No resp difficulty General:  Well appearing. No resp difficulty HEENT: normal Neck: supple. no JVD. Carotids 2+ bilat; no bruits. No lymphadenopathy or thryomegaly appreciated. Cor: PMI nondisplaced. Irregular rate & rhythm. 2/6 MR. Lungs: clear Abdomen: soft, nontender, nondistended. No hepatosplenomegaly. No bruits or masses. Good bowel sounds. Extremities: no cyanosis,  clubbing, rash, edema Neuro: alert & orientedx3, cranial nerves grossly intact. moves all 4 extremities w/o difficulty. Affect pleasant   Recent Labs: 02/10/2020: ALT 29; BNP 539.8; BUN 59; Creatinine, Ser 2.10; Hemoglobin 11.9; Platelets 189; Potassium 4.8; Sodium 143  Personally reviewed   Wt Readings from Last 3 Encounters:  04/05/20 63 kg (138 lb 12.8 oz)  02/10/20 64.4 kg (142 lb)  01/28/20 65.2 kg (143 lb 12.8 oz)     ASSESSMENT AND PLAN:  1.Chronic diastolic congestive heart failure with h/o low output: - Echo 08/01/17 LVEF 50-55%, Mild AS, Mod MR, Mod LAE, Mild RAE, Mod TR, RV moderately HK PA peak pressure 65 mm Hg. - Cath 08/21/17 with signficantly elevated filling pressures, severe MR, and low cardiac output. Had cardiorenal syndrome on admit with severe MR and RV failure and required milrinone - Echo 10/09/17: EF 55-60%, mild to mod MR - Echo 12/20 EF 60-65% mod MR  - Echo 12/21 EF 55-60% Moderate RV HK. Personally reviewed - Stable NYHA II-III. Volume status looks good. Continue current regimen - Labs today  2. Severe mitral regurgitation: - Echo 08/01/17 LVEF 50-55%, Mild AS, Mod MR, Mod LAE, Mild RAE, Mod TR, PA peak pressure 65 mm Hg.MR ++ calcified, and RV mild/moderately reduced function. - TEE 6/17 showed only mild MR with restricted posterior leaflet.  -Echo 10/09/17: EF 55-60%, mild to mod MR -  Echo 12/20 EF 60-65% mod MR  -Likely functional MR and MR improved with hemodynamic optimization.  Doing well now. Will follow closely if fails close medical therapy consider MitraClip. No change  3. Chronic atrial fibrillation: - Has previously failed amiodarone and DCCV x3.  - Failed DC-CV with amio on 11/30/18 - Doubt we will be able to maintain NSR. Seems to be tolerating AF ok. If cannot maintain rate control will need AVN ablation and CRT  - Continue Lopressor 75 bid. (dose recently increased by Dr. Fletcher Anon and rate better) - Continue Xarelto. No bleeding. CBC  today - Seen by Dr. Rayann Heman 12/01/17. He did not recommend either afib ablation or AV node ablation + PPM at that time - Good rate control. Zio 9/21 Chronic AF. Mean HR 90.   4. Essential hypertension: - Blood pressure well controlled. Continue current regimen.  5. CKD IV with h/o AKI - Creatinine has been trending up over the last year 1.27 -> 1.63 on 09/09/19 -> 2.1 12/21 - repeat today - Follows with Dr. Holley Raring  Signed, Glori Bickers, MD  04/05/2020 1:56 PM  Advanced Heart  Failure [place] [phone] (office) [phone] (fax)

## 2020-04-05 NOTE — Patient Instructions (Signed)
Labs done today, your results will be available in MyChart, we will contact you for abnormal readings.  Please call our office in June 2022 for an appointment   If you have any questions or concerns before your next appointment please send [place] a message through [place] or call our office at [phone].    TO LEAVE A MESSAGE FOR THE NURSE SELECT OPTION 2, PLEASE LEAVE A MESSAGE INCLUDING: . YOUR NAME . DATE OF BIRTH . CALL BACK NUMBER . REASON FOR CALL**this is important as we prioritize the call backs  [place] AS LONG AS YOU CALL BEFORE 4:00 PM  At the [facility], you and your health needs are our priority. As part of our continuing mission to provide you with exceptional heart care, we have created designated Provider Care Teams. These Care Teams include your primary Cardiologist (physician) and Advanced Practice Providers (APPs- Physician Assistants and Nurse Practitioners) who all work together to provide you with the care you need, when you need it.   You may see any of the following providers on your designated Care Team at your next follow up: Marland Kitchen Dr Glori Bickers . Dr Loralie Champagne . Darrick Grinder, NP . Lyda Jester, [place] . Audry Riles, PharmD   Please be sure to bring in all your medications bottles to every appointment.

## 2020-04-05 NOTE — Addendum Note (Signed)
Encounter addended by: Stanford Scotland, RN on: 04/05/2020 2:33 PM  Actions taken: Visit diagnoses modified, Order list changed, Diagnosis association updated, Charge Capture section accepted, Clinical Note Signed

## 2020-04-06 ENCOUNTER — Encounter: Payer: Self-pay | Admitting: Cardiovascular Disease

## 2020-04-06 ENCOUNTER — Telehealth: Payer: Self-pay

## 2020-04-06 ENCOUNTER — Ambulatory Visit (INDEPENDENT_AMBULATORY_CARE_PROVIDER_SITE_OTHER): Payer: Medicare Other | Admitting: Cardiovascular Disease

## 2020-04-06 VITALS — BP 110/70 | HR 106 | Ht 63.0 in | Wt 139.5 lb

## 2020-04-06 DIAGNOSIS — I4821 Permanent atrial fibrillation: Secondary | ICD-10-CM | POA: Diagnosis not present

## 2020-04-06 DIAGNOSIS — I2510 Atherosclerotic heart disease of native coronary artery without angina pectoris: Secondary | ICD-10-CM

## 2020-04-06 DIAGNOSIS — I5033 Acute on chronic diastolic (congestive) heart failure: Secondary | ICD-10-CM | POA: Diagnosis not present

## 2020-04-06 DIAGNOSIS — I340 Nonrheumatic mitral (valve) insufficiency: Secondary | ICD-10-CM | POA: Diagnosis not present

## 2020-04-06 DIAGNOSIS — I10 Essential (primary) hypertension: Secondary | ICD-10-CM

## 2020-04-06 MED ORDER — TORSEMIDE 20 MG PO TABS
ORAL_TABLET | ORAL | 0 refills | Status: DC
Start: 2020-04-06 — End: 2020-06-13

## 2020-04-06 MED ORDER — LOSARTAN POTASSIUM 25 MG PO TABS
25.0000 mg | ORAL_TABLET | Freq: Every day | ORAL | 2 refills | Status: DC
Start: 2020-04-06 — End: 2020-04-06

## 2020-04-06 MED ORDER — METOPROLOL TARTRATE 100 MG PO TABS: 100.0000 mg | ORAL_TABLET | Freq: Two times a day (BID) | ORAL | 2 refills | Status: DC

## 2020-04-06 MED ORDER — RIVAROXABAN 15 MG PO TABS: ORAL_TABLET | ORAL | 1 refills | Status: DC

## 2020-04-06 NOTE — Progress Notes (Signed)
Cardiology Office Note   Date:  04/06/2020   ID:  Ruth Roth, DOB 03/10/1929, MRN [id number]  PCP:  Einar Pheasant, MD  Cardiologist:   [name], MD   Chief Complaint  Patient presents with  . 1 month follow up.     Patient c/o shortness of breath with little exertion. Medications reviewed by the patient verbally.       History of Present Illness: Ruth Roth is a 87 y.o. female who presents for a follow-up visit regarding coronary artery disease, chronic diastolic heart failure, mitral regurgitation and persistent atrial fibrillation .  She has known history of hypertension, peptic ulcer disease, fibrocystic breast disease, inflammatory arthritis, autoimmune hepatitis and neuropathy.  She had a small non-ST elevation myocardial infarction in March 2016 with acute diastolic heart failure after a GI illness.  Echocardiogram showed normal LV systolic function. Cardiac catheterization showed showed 60% ostial left circumflex hazy stenosis which was possibly the culprit. Mild LAD/RCA disease. She was treated medically.   She had worsening heart failure 2019.  Right and left cardiac catheterization in June 2019 showed  showed mild nonobstructive coronary artery disease.  Ejection fraction was 50 to 55% with severe mitral regurgitation.  Right heart catheterization showed severely elevated filling pressures with moderate to severe pulmonary hypertension and severely reduced cardiac output. I transferred the patient to [facility] where she was effectively diuresed with milrinone with subsequent improvement in symptoms.  She had a transesophageal echocardiogram done which showed normal LV systolic function with only mild mitral regurgitation.  The mitral regurgitation was felt to be functional with subsequent improvement after diuresis. Atrial fibrillation is currently being treated with rate control due to symptomatic maintaining sinus rhythm.    She was seen recently for increased  shortness of breath and palpitations with weight gain.  I increased her torsemide to 40 mg once daily.  I also increase metoprolol to 100 mg twice daily and discontinued losartan.  An echocardiogram was done which showed normal LV systolic function, severe pulmonary hypertension and moderate mitral regurgitation.  She reports improvement in shortness of breath.  She saw Dr. Haroldine Laws yesterday and had labs done.  Her creatinine was stable at 1.8 but BUN was elevated at 92.   Past Medical History:  Diagnosis Date  . (HFpEF) heart failure with preserved ejection fraction ([place])    a. 07/2017 Echo: EF 50-55%, no rwma, mild AS/AI, mod MR, mod dil LA, mod TR, PASP 42mmHg.  . Autoimmune hepatitis ([place])    followed by Dr [name]  . Fibrocystic breast disease   . Glaucoma   . Hypercholesterolemia   . Hypertension   . Hypothyroidism   . Inflammatory arthritis   . MI (myocardial infarction) ([place])   . Neuropathy   . Non-obstructive Coronary artery disease    a. 05/2014 NSTEMI/Cath: LM nl, LAD mild dzs, LCX 60ost hazy (? culprit), RCA mild dzs. EF 60% by echo-->Med Rx; b. 08/2017 Cath: LM 30ost, LAD min irregs, LCX small, 40ost/p, RCA large, min irregs, RPDA/RPL1 nl, EF 50-55%. 4+MR.  . Osteoarthritis   . Permanent atrial fibrillation (HCC)    a. CHA2DS2VASc = 6-->Xarelto.  . 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 Severe mitral regurgitation    a. 07/2017 Echo: Mod MR; b. 08/2017 Cath: 4+/Severe MR.    Past Surgical History:  Procedure Laterality Date  . ABDOMINAL HYSTERECTOMY  1963   partial, secondary to fibroids  .  APPENDECTOMY    . Billroth    . CARDIAC CATHETERIZATION  05/12/14   [facility]  . CARDIOVERSION N/A 12/26/2016   Procedure: CARDIOVERSION;  Surgeon: [name], MD;  Location: [facility] ORS;  Service: Cardiovascular;  Laterality: N/A;  . CARDIOVERSION N/A 05/16/2017   Procedure: CARDIOVERSION;  Surgeon: [name], MD;   Location: [facility] ORS;  Service: Cardiovascular;  Laterality: N/A;  . CARDIOVERSION N/A 06/09/2017   Procedure: CARDIOVERSION;  Surgeon: [name], MD;  Location: [facility] ORS;  Service: Cardiovascular;  Laterality: N/A;  . CARDIOVERSION N/A 11/30/2018   Procedure: CARDIOVERSION;  Surgeon: [name], MD;  Location: [facility] ORS;  Service: Cardiovascular;  Laterality: N/A;  . CHOLECYSTECTOMY  1998  . COLONOSCOPY  2009  . RIGHT/LEFT HEART CATH AND CORONARY ANGIOGRAPHY Bilateral 08/21/2017   Procedure: RIGHT/LEFT HEART CATH AND CORONARY ANGIOGRAPHY;  Surgeon: [name], MD;  Location: [place] [facility];  Service: Cardiovascular;  Laterality: Bilateral;  . TEE WITHOUT CARDIOVERSION N/A 08/25/2017   Procedure: TRANSESOPHAGEAL ECHOCARDIOGRAM (TEE);  Surgeon: Jolaine Artist, MD;  Location: [facility] ENDOSCOPY;  Service: Cardiovascular;  Laterality: N/A;  . UPPER GI ENDOSCOPY  2004     Current Outpatient Medications  Medication Sig Dispense Refill  . calcitRIOL (ROCALTROL) 0.25 MCG capsule Take 0.25 mcg by mouth daily.     . dorzolamide (TRUSOPT) 2 % ophthalmic solution Place 1 drop into the left eye 2 (two) times daily.    . fluticasone (FLONASE) 50 MCG/ACT nasal spray Place 2 sprays into both nostrils daily as needed for allergies.     Marland Kitchen gabapentin (NEURONTIN) 300 MG capsule One capsule tid and two capsules q hs. 150 capsule 2  . hydroxychloroquine (PLAQUENIL) 200 MG tablet Take 1 tablet by mouth daily.    Marland Kitchen ipratropium (ATROVENT) 0.06 % nasal spray PLACE 1 SPRAY INTO BOTH NOSTRILS DAILY AS NEEDED FOR RHINITIS. 15 mL 1  . latanoprost (XALATAN) 0.005 % ophthalmic solution Place 1 drop into both eyes at bedtime.     Marland Kitchen levothyroxine (SYNTHROID) 112 MCG tablet Take 112 mcg by mouth daily before breakfast.    . losartan (COZAAR) 25 MG tablet Take 1 tablet by mouth daily.    . metoprolol tartrate (LOPRESSOR) 100 MG tablet Take 1 tablet (100 mg total) by mouth 2 (two) times daily. 180 tablet 3   . Multiple Vitamin (MULTI-VITAMIN) tablet Take 1 tablet by mouth at bedtime.     . pantoprazole (PROTONIX) 20 MG tablet TAKE 1 TABLET BY MOUTH EVERY DAY 90 tablet 1  . torsemide (DEMADEX) 20 MG tablet Take 2 tablets (40 mg total) by mouth daily. 180 tablet 3  . traMADol (ULTRAM) 50 MG tablet Take 1 tablet (50 mg total) by mouth 2 (two) times daily as needed (for pain.). 60 tablet 0  . ursodiol (ACTIGALL) 300 MG capsule Take 300-600 mg by mouth See admin instructions. Take 2 capsules (600 mg) daily in the morning   & 1 capsule (300 mg) at night.    . vitamin B-12 (CYANOCOBALAMIN) 1000 MCG tablet Take 1 tablet (1,000 mcg total) by mouth daily. 30 tablet 3  . XARELTO 15 MG TABS tablet TAKE 1 TABLET (15 MG TOTAL) BY MOUTH DAILY WITH SUPPER. 30 tablet 6   No current facility-administered medications for this visit.    Allergies:   Statins, Haldol [haloperidol lactate], Librax [chlordiazepoxide-clidinium], Plavix [clopidogrel bisulfate], and Ramipril    Social History:  The patient  reports that she has never smoked. She has never  used smokeless tobacco. She reports that she does not drink alcohol and does not use drugs.   Family History:  The patient's family history includes Colon cancer in her daughter; Esophageal cancer in her brother; Ovarian cancer in her daughter; Stroke in her mother.    ROS:  Please see the history of present illness.   Otherwise, review of systems are positive for none.   All other systems are reviewed and negative.    PHYSICAL EXAM: VS:  BP 110/70 (BP Location: Left Arm, Patient Position: Sitting, Cuff Size: Normal)   Pulse (!) 106   Ht 5\' 3"  (1.6 m)   Wt 139 lb 8 oz (63.3 kg)   SpO2 92%   BMI 24.71 kg/m  , BMI Body mass index is 24.71 kg/m. GEN: Well nourished, well developed, in no acute distress  HEENT: normal  Neck: No JVD, carotid bruits, or masses Cardiac: Irregularly irregular and mildly tachycardic; no rubs, or gallops.  Trace bilateral leg edema. 1/  6 systolic ejection murmur at the aortic area.  Mild bilateral leg edema Respiratory: Clear to auscultation GI: soft, nontender, nondistended, + BS MS: no deformity or atrophy  Skin: warm and dry, no rash Neuro:  Strength and sensation are intact Psych: euthymic mood, full affect   EKG:  EKG is ordered today. The ekg ordered today demonstrates atrial fibrillation with ventricular rate of 105 bpm.   Recent Labs: 04/05/2020: ALT 19; B Natriuretic Peptide 754.0; BUN 92; Creatinine, Ser 1.80; Hemoglobin 13.3; Platelets 157; Potassium 4.0; Sodium 138    Lipid Panel    Component Value Date/Time   CHOL 126 07/08/2019 1028   TRIG 124.0 07/08/2019 1028   HDL 44.20 07/08/2019 1028   CHOLHDL 3 07/08/2019 1028   VLDL 24.8 07/08/2019 1028   LDLCALC 57 07/08/2019 1028   LDLDIRECT 143.5 12/17/2012 0955      Wt Readings from Last 3 Encounters:  04/06/20 139 lb 8 oz (63.3 kg)  04/05/20 138 lb 12.8 oz (63 kg)  02/10/20 142 lb (64.4 kg)       ASSESSMENT AND PLAN:  1.    Permanent atrial fibrillation: Ventricular rate is reasonably controlled on metoprolol 100 mg twice daily.  Continue anticoagulation with Xarelto 15 mg daily given underlying chronic kidney disease.  2.  Chronic diastolic heart failure: Volume overload improved after increasing torsemide to 40 mg once daily.  However, labs showed significantly elevated BUN to creatinine ratio suggestive of some volume depletion.  I am going to change the dose of torsemide to 40 mg once daily alternating with 20 mg once daily.   3. Essential hypertension: I recently discontinued losartan given increasing the dose of metoprolol.  4.  Functional mitral regurgitation: This was moderate on recent echocardiogram.  5.  Coronary artery disease involving native coronary arteries without angina: Continue medical therapy.   Disposition:   FU with me in 3 months.  Signed,  [name], MD  04/06/2020 2:20 PM    [place]

## 2020-04-06 NOTE — Telephone Encounter (Signed)
Per Dr. Fletcher Anon that patients Losartan d/c and should not be on the patients medication list.  Losartan was e-scribed to the patients pharmacy [facility] and cancelled the prescription.  Called the patient and left a detailed message on her voicemail. Patient is to contact the office of any questions.

## 2020-04-06 NOTE — Addendum Note (Signed)
Addended by: Lamar Laundry on: 04/06/2020 03:42 PM   Modules accepted: Orders

## 2020-04-06 NOTE — Patient Instructions (Signed)
Medication Instructions:  Your physician has recommended you make the following change in your medication:   CHANGE Torsemide to alternate taking (2) tablets 40 mg daily and (1) tablet 20 mg daily the next day.   *If you need a refill on your cardiac medications before your next appointment, please call your pharmacy*   Lab Work: None ordered If you have labs (blood work) drawn today and your tests are completely normal, you will receive your results only by: Marland Kitchen MyChart Message (if you have MyChart) OR . A paper copy in the mail If you have any lab test that is abnormal or we need to change your treatment, we will call you to review the results.   Testing/Procedures: None ordered   Follow-Up: At [facility], you and your health needs are our priority.  As part of our continuing mission to provide you with exceptional heart care, we have created designated Provider Care Teams.  These Care Teams include your primary Cardiologist (physician) and Advanced Practice Providers (APPs -  Physician Assistants and Nurse Practitioners) who all work together to provide you with the care you need, when you need it.  We recommend signing up for the patient portal called "MyChart".  Sign up information is provided on this After Visit Summary.  MyChart is used to connect with patients for Virtual Visits (Telemedicine).  Patients are able to view lab/test results, encounter notes, upcoming appointments, etc.  Non-urgent messages can be sent to your provider as well.   To learn more about what you can do with MyChart, go to [email or web address].    Your next appointment:   3 month(s)  The format for your next appointment:   In Person  Provider:   You may see [name], MD or one of the following Advanced Practice Providers on your designated Care Team:    Murray Hodgkins, NP  Christell Faith, PA-C  Marrianne Mood, PA-C  Cadence [place], [place]  Laurann Montana, NP    Other  Instructions N/A

## 2020-04-10 DIAGNOSIS — M810 Age-related osteoporosis without current pathological fracture: Secondary | ICD-10-CM | POA: Diagnosis not present

## 2020-04-17 ENCOUNTER — Other Ambulatory Visit: Payer: Self-pay | Admitting: *Deleted

## 2020-04-17 MED ORDER — VITAMIN B-12 1000 MCG PO TABS: 1000.0000 ug | ORAL_TABLET | Freq: Every day | ORAL | 3 refills | Status: DC

## 2020-04-25 ENCOUNTER — Other Ambulatory Visit: Payer: Self-pay | Admitting: Oncology

## 2020-05-08 ENCOUNTER — Inpatient Hospital Stay: Payer: Medicare Other | Attending: Oncology

## 2020-05-08 DIAGNOSIS — D500 Iron deficiency anemia secondary to blood loss (chronic): Secondary | ICD-10-CM | POA: Insufficient documentation

## 2020-05-08 LAB — CBC WITH DIFFERENTIAL/PLATELET
Abs Immature Granulocytes: 0.02 10*3/uL (ref 0.00–0.07)
Basophils Absolute: 0 10*3/uL (ref 0.0–0.1)
Basophils Relative: 1 %
Eosinophils Absolute: 0.3 10*3/uL (ref 0.0–0.5)
Eosinophils Relative: 5 %
HCT: 41.9 % (ref 36.0–46.0)
Hemoglobin: 12.9 g/dL (ref 12.0–15.0)
Immature Granulocytes: 0 %
Lymphocytes Relative: 13 %
Lymphs Abs: 0.8 10*3/uL (ref 0.7–4.0)
MCH: 32.5 pg (ref 26.0–34.0)
MCHC: 30.8 g/dL (ref 30.0–36.0)
MCV: 105.5 fL — ABNORMAL HIGH (ref 80.0–100.0)
Monocytes Absolute: 0.6 10*3/uL (ref 0.1–1.0)
Monocytes Relative: 11 %
Neutro Abs: 4.1 10*3/uL (ref 1.7–7.7)
Neutrophils Relative %: 70 %
Platelets: 187 10*3/uL (ref 150–400)
RBC: 3.97 MIL/uL (ref 3.87–5.11)
RDW: 14.4 % (ref 11.5–15.5)
WBC: 5.8 10*3/uL (ref 4.0–10.5)
nRBC: 0 % (ref 0.0–0.2)

## 2020-05-08 LAB — IRON AND TIBC
Iron: 62 ug/dL (ref 28–170)
Saturation Ratios: 12 % (ref 10.4–31.8)
TIBC: 517 ug/dL — ABNORMAL HIGH (ref 250–450)
UIBC: 455 ug/dL

## 2020-05-08 LAB — COMPREHENSIVE METABOLIC PANEL
ALT: 20 U/L (ref 0–44)
AST: 15 U/L (ref 15–41)
Albumin: 4.1 g/dL (ref 3.5–5.0)
Alkaline Phosphatase: 87 U/L (ref 38–126)
Anion gap: 12 (ref 5–15)
BUN: 82 mg/dL — ABNORMAL HIGH (ref 8–23)
CO2: 25 mmol/L (ref 22–32)
Calcium: 9.4 mg/dL (ref 8.9–10.3)
Chloride: 101 mmol/L (ref 98–111)
Creatinine, Ser: 1.53 mg/dL — ABNORMAL HIGH (ref 0.44–1.00)
GFR, Estimated: 32 mL/min — ABNORMAL LOW (ref >60)
Glucose, Bld: 119 mg/dL — ABNORMAL HIGH (ref 70–99)
Potassium: 3.8 mmol/L (ref 3.5–5.1)
Sodium: 138 mmol/L (ref 135–145)
Total Bilirubin: 0.6 mg/dL (ref 0.3–1.2)
Total Protein: 6.9 g/dL (ref 6.5–8.1)

## 2020-05-08 LAB — FOLATE: Folate: 68 ng/mL (ref >5.9)

## 2020-05-08 LAB — VITAMIN B12: Vitamin B-12: 3037 pg/mL — ABNORMAL HIGH (ref 180–914)

## 2020-05-08 LAB — FERRITIN: Ferritin: 24 ng/mL (ref 11–307)

## 2020-05-09 ENCOUNTER — Inpatient Hospital Stay: Payer: Medicare Other | Attending: Oncology | Admitting: Oncology

## 2020-05-09 ENCOUNTER — Inpatient Hospital Stay: Payer: Medicare Other

## 2020-05-09 ENCOUNTER — Encounter: Payer: Self-pay | Admitting: Oncology

## 2020-05-09 VITALS — BP 120/74 | HR 67 | Temp 97.9°F | Resp 18 | Wt 141.2 lb

## 2020-05-09 DIAGNOSIS — K754 Autoimmune hepatitis: Secondary | ICD-10-CM | POA: Diagnosis not present

## 2020-05-09 DIAGNOSIS — I110 Hypertensive heart disease with heart failure: Secondary | ICD-10-CM | POA: Insufficient documentation

## 2020-05-09 DIAGNOSIS — Z9049 Acquired absence of other specified parts of digestive tract: Secondary | ICD-10-CM | POA: Diagnosis not present

## 2020-05-09 DIAGNOSIS — R040 Epistaxis: Secondary | ICD-10-CM | POA: Insufficient documentation

## 2020-05-09 DIAGNOSIS — Z79899 Other long term (current) drug therapy: Secondary | ICD-10-CM | POA: Insufficient documentation

## 2020-05-09 DIAGNOSIS — D7589 Other specified diseases of blood and blood-forming organs: Secondary | ICD-10-CM | POA: Insufficient documentation

## 2020-05-09 DIAGNOSIS — I2510 Atherosclerotic heart disease of native coronary artery without angina pectoris: Secondary | ICD-10-CM | POA: Insufficient documentation

## 2020-05-09 DIAGNOSIS — D469 Myelodysplastic syndrome, unspecified: Secondary | ICD-10-CM | POA: Insufficient documentation

## 2020-05-09 DIAGNOSIS — Z888 Allergy status to other drugs, medicaments and biological substances status: Secondary | ICD-10-CM | POA: Diagnosis not present

## 2020-05-09 DIAGNOSIS — Z8041 Family history of malignant neoplasm of ovary: Secondary | ICD-10-CM | POA: Diagnosis not present

## 2020-05-09 DIAGNOSIS — D500 Iron deficiency anemia secondary to blood loss (chronic): Secondary | ICD-10-CM | POA: Insufficient documentation

## 2020-05-09 DIAGNOSIS — Z823 Family history of stroke: Secondary | ICD-10-CM | POA: Insufficient documentation

## 2020-05-09 DIAGNOSIS — Z7901 Long term (current) use of anticoagulants: Secondary | ICD-10-CM | POA: Diagnosis not present

## 2020-05-09 DIAGNOSIS — Z801 Family history of malignant neoplasm of trachea, bronchus and lung: Secondary | ICD-10-CM | POA: Diagnosis not present

## 2020-05-09 DIAGNOSIS — E7800 Pure hypercholesterolemia, unspecified: Secondary | ICD-10-CM | POA: Diagnosis not present

## 2020-05-09 DIAGNOSIS — Z8711 Personal history of peptic ulcer disease: Secondary | ICD-10-CM | POA: Diagnosis not present

## 2020-05-09 DIAGNOSIS — I5032 Chronic diastolic (congestive) heart failure: Secondary | ICD-10-CM | POA: Diagnosis not present

## 2020-05-09 DIAGNOSIS — I252 Old myocardial infarction: Secondary | ICD-10-CM | POA: Insufficient documentation

## 2020-05-09 DIAGNOSIS — E039 Hypothyroidism, unspecified: Secondary | ICD-10-CM | POA: Diagnosis not present

## 2020-05-09 DIAGNOSIS — R5383 Other fatigue: Secondary | ICD-10-CM | POA: Insufficient documentation

## 2020-05-09 DIAGNOSIS — Z808 Family history of malignant neoplasm of other organs or systems: Secondary | ICD-10-CM | POA: Insufficient documentation

## 2020-05-09 DIAGNOSIS — I4821 Permanent atrial fibrillation: Secondary | ICD-10-CM | POA: Insufficient documentation

## 2020-05-09 DIAGNOSIS — Z800 Family history of malignant neoplasm of digestive organs: Secondary | ICD-10-CM | POA: Diagnosis not present

## 2020-05-09 NOTE — Progress Notes (Signed)
Hematology/Oncology follow up note [facility] [phone] [phone]   Patient Care Team: Einar Pheasant, MD as PCP - General (Unknown Physician Specialty) [name], MD as PCP - Cardiology (Cardiology) Bensimhon, Shaune Pascal, MD as PCP - Advanced Heart Failure (Cardiology) Byrnett, Forest Gleason, MD (General Surgery) Earlie Server, MD as Consulting Physician (Oncology)  REFERRING PROVIDER: Einar Pheasant, MD CHIEF COMPLAINTS/REASON FOR VISIT:  Evaluation of anemia  HISTORY OF PRESENTING ILLNESS:  Ruth Roth is a  87 y.o.  female with PMH listed below who was referred to me for evaluation of anemia Reviewed patient's recent labs, Labs revealed anemia with hemoglobin of 10.2 on 01/27/2018. MCV 88.9,   Reviewed patient's previous labs ordered by primary care physician's office, anemia is chronic onset , duration is since 2016, gradually declines. No aggravating or improving factors. She takes Xarelto for A Fib. Has recurrent epistaxis. History of IDA, cannot tolerate oral iron supplements.   Associated signs and symptoms: Patient reports fatigue.  deniesSOB with exertion.  Denies weight loss, easy bruising, hematochezia, hemoptysis, hematuria. Context: History of GI bleeding: deneis               History of Chronic kidney disease: CKD               Last colonoscopy: 2007 Patient has a history of autoimmune hepatitis/PBC  Follows up with gastroenterology.  # received IV Feraheme 510 mg x 2. INTERVAL HISTORY Ruth Roth is a 87 y.o. female who has above history reviewed by me today presents for follow up visit for management of anemia Problems and complaints are listed below:  Patient is on chronic anticoagulation with Xarelto for atrial fibrillation. Denies any constitutional symptoms.  Feeling well.    Review of Systems  Constitutional: Positive for fatigue. Negative for appetite change, chills and fever.  HENT:   Negative for  hearing loss and voice change.   Eyes: Negative for eye problems.  Respiratory: Negative for chest tightness and cough.   Cardiovascular: Negative for chest pain.  Gastrointestinal: Negative for abdominal distention, abdominal pain and blood in stool.  Endocrine: Negative for hot flashes.  Genitourinary: Negative for difficulty urinating and frequency.   Musculoskeletal: Negative for arthralgias.  Skin: Negative for itching and rash.  Neurological: Negative for extremity weakness.  Hematological: Negative for adenopathy.  Psychiatric/Behavioral: Negative for confusion.    MEDICAL HISTORY:  Past Medical History:  Diagnosis Date  . (HFpEF) heart failure with preserved ejection fraction ([place])    a. 07/2017 Echo: EF 50-55%, no rwma, mild AS/AI, mod MR, mod dil LA, mod TR, PASP 7mmHg.  . Autoimmune hepatitis ([place])    followed by Dr [name]  . Fibrocystic breast disease   . Glaucoma   . Hypercholesterolemia   . Hypertension   . Hypothyroidism   . Inflammatory arthritis   . MI (myocardial infarction) ([place])   . Neuropathy   . Non-obstructive Coronary artery disease    a. 05/2014 NSTEMI/Cath: LM nl, LAD mild dzs, LCX 60ost hazy (? culprit), RCA mild dzs. EF 60% by echo-->Med Rx; b. 08/2017 Cath: LM 30ost, LAD min irregs, LCX small, 40ost/p, RCA large, min irregs, RPDA/RPL1 nl, EF 50-55%. 4+MR.  . Osteoarthritis   . Permanent atrial fibrillation (HCC)    a. CHA2DS2VASc = 6-->Xarelto.  . 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 Severe mitral regurgitation    a. 07/2017 Echo: Mod MR; b. 08/2017 Cath:  4+/Severe MR.    SURGICAL HISTORY: Past Surgical History:  Procedure Laterality Date  . ABDOMINAL HYSTERECTOMY  1963   partial, secondary to fibroids  . APPENDECTOMY    . Billroth    . CARDIAC CATHETERIZATION  05/12/14   [facility]  . CARDIOVERSION N/A 12/26/2016   Procedure: CARDIOVERSION;  Surgeon: [name], MD;  Location: [facility]  ORS;  Service: Cardiovascular;  Laterality: N/A;  . CARDIOVERSION N/A 05/16/2017   Procedure: CARDIOVERSION;  Surgeon: [name], MD;  Location: [facility] ORS;  Service: Cardiovascular;  Laterality: N/A;  . CARDIOVERSION N/A 06/09/2017   Procedure: CARDIOVERSION;  Surgeon: [name], MD;  Location: [facility] ORS;  Service: Cardiovascular;  Laterality: N/A;  . CARDIOVERSION N/A 11/30/2018   Procedure: CARDIOVERSION;  Surgeon: [name], MD;  Location: [facility] ORS;  Service: Cardiovascular;  Laterality: N/A;  . CHOLECYSTECTOMY  1998  . COLONOSCOPY  2009  . RIGHT/LEFT HEART CATH AND CORONARY ANGIOGRAPHY Bilateral 08/21/2017   Procedure: RIGHT/LEFT HEART CATH AND CORONARY ANGIOGRAPHY;  Surgeon: [name], MD;  Location: [place] [facility] LAB;  Service: Cardiovascular;  Laterality: Bilateral;  . TEE WITHOUT CARDIOVERSION N/A 08/25/2017   Procedure: TRANSESOPHAGEAL ECHOCARDIOGRAM (TEE);  Surgeon: Jolaine Artist, MD;  Location: [facility] ENDOSCOPY;  Service: Cardiovascular;  Laterality: N/A;  . UPPER GI ENDOSCOPY  2004    SOCIAL HISTORY: Social History   Socioeconomic History  . Marital status: Widowed    Spouse name: Not on file  . Number of children: 2  . Years of education: Not on file  . Highest education level: Not on file  Occupational History  . Occupation: retired  Tobacco Use  . Smoking status: Never Smoker  . Smokeless tobacco: Never Used  Vaping Use  . Vaping Use: Never used  Substance and Sexual Activity  . Alcohol use: No    Alcohol/week: 0.0 standard drinks  . Drug use: No  . Sexual activity: Never  Other Topics Concern  . Not on file  Social History Narrative  . Not on file   Social Determinants of Health   Financial Resource Strain: Not on file  Food Insecurity: Not on file  Transportation Needs: Not on file  Physical Activity: Not on file  Stress: Not on file  Social Connections: Not on file  Intimate Partner Violence: Not on file    FAMILY  HISTORY: Family History  Problem Relation Age of Onset  . Stroke Mother   . Esophageal cancer Brother        also had lung cancer  . Ovarian cancer Daughter   . Colon cancer Daughter   . Breast cancer Neg Hx     ALLERGIES:  is allergic to statins, haldol [haloperidol lactate], librax [chlordiazepoxide-clidinium], plavix [clopidogrel bisulfate], and ramipril.  MEDICATIONS:  Current Outpatient Medications  Medication Sig Dispense Refill  . calcitRIOL (ROCALTROL) 0.25 MCG capsule Take 0.25 mcg by mouth daily.     . [employer] VITAMIN B12 1000 MCG tablet TAKE 1 TABLET BY MOUTH EVERY DAY 60 tablet 1  . dorzolamide (TRUSOPT) 2 % ophthalmic solution Place 1 drop into the left eye 2 (two) times daily.    . fluticasone (FLONASE) 50 MCG/ACT nasal spray Place 2 sprays into both nostrils daily as needed for allergies.     Marland Kitchen gabapentin (NEURONTIN) 300 MG capsule One capsule tid and two capsules q hs. 150 capsule 2  . hydroxychloroquine (PLAQUENIL) 200 MG tablet Take 1 tablet by mouth daily.    Marland Kitchen ipratropium (ATROVENT) 0.06 %  nasal spray PLACE 1 SPRAY INTO BOTH NOSTRILS DAILY AS NEEDED FOR RHINITIS. 15 mL 1  . latanoprost (XALATAN) 0.005 % ophthalmic solution Place 1 drop into both eyes at bedtime.     Marland Kitchen levothyroxine (SYNTHROID) 112 MCG tablet Take 112 mcg by mouth daily before breakfast.    . losartan (COZAAR) 25 MG tablet Take 25 mg by mouth daily.    . metoprolol tartrate (LOPRESSOR) 100 MG tablet Take 1 tablet (100 mg total) by mouth 2 (two) times daily. 180 tablet 2  . Multiple Vitamin (MULTI-VITAMIN) tablet Take 1 tablet by mouth at bedtime.     . pantoprazole (PROTONIX) 20 MG tablet TAKE 1 TABLET BY MOUTH EVERY DAY 90 tablet 1  . Rivaroxaban (XARELTO) 15 MG TABS tablet TAKE 1 TABLET (15 MG TOTAL) BY MOUTH DAILY WITH SUPPER. 90 tablet 1  . torsemide (DEMADEX) 20 MG tablet Alternate (2) tablets 40 mg daily and then (1) tablet 20 mg daily the next day. 144 tablet 0  . traMADol (ULTRAM) 50 MG tablet  Take 1 tablet (50 mg total) by mouth 2 (two) times daily as needed (for pain.). 60 tablet 0  . ursodiol (ACTIGALL) 300 MG capsule Take 300-600 mg by mouth See admin instructions. Take 2 capsules (600 mg) daily in the morning   & 1 capsule (300 mg) at night.     No current facility-administered medications for this visit.     PHYSICAL EXAMINATION: ECOG PERFORMANCE STATUS: 1 - Symptomatic but completely ambulatory Vitals:   05/09/20 1310  BP: 120/74  Pulse: 67  Resp: 18  Temp: 97.9 F (36.6 C)   Filed Weights   05/09/20 1310  Weight: 141 lb 3.2 oz (64 kg)    Physical Exam Constitutional:      General: She is not in acute distress. HENT:     Head: Normocephalic and atraumatic.  Eyes:     General: No scleral icterus.    Pupils: Pupils are equal, round, and reactive to light.  Cardiovascular:     Rate and Rhythm: Normal rate and regular rhythm.     Heart sounds: Normal heart sounds.     Comments: Irregular rhythm.  Pulmonary:     Effort: Pulmonary effort is normal. No respiratory distress.     Breath sounds: No wheezing.  Abdominal:     General: Bowel sounds are normal. There is no distension.     Palpations: Abdomen is soft. There is no mass.     Tenderness: There is no abdominal tenderness.  Musculoskeletal:        General: No deformity. Normal range of motion.     Cervical back: Normal range of motion and neck supple.  Skin:    General: Skin is warm and dry.     Findings: No erythema or rash.  Neurological:     Mental Status: She is alert and oriented to person, place, and time. Mental status is at baseline.     Cranial Nerves: No cranial nerve deficit.     Coordination: Coordination normal.  Psychiatric:        Mood and Affect: Mood normal.      LABORATORY DATA:  I have reviewed the data as listed Lab Results  Component Value Date   WBC 5.8 05/08/2020   HGB 12.9 05/08/2020   HCT 41.9 05/08/2020   MCV 105.5 (H) 05/08/2020   PLT 187 05/08/2020   Recent  Labs    06/02/19 1315 07/08/19 1028 07/13/19 1458 11/16/19 1456 02/10/20 1542 04/05/20  1430 05/08/20 1312  NA 139 139   < > 136 143 138 138  K 3.7 4.1   < > 4.8 4.8 4.0 3.8  CL 107 102   < > 106 109* 101 101  CO2 20* 26   < > 19* 18* 25 25  GLUCOSE 85 97   < > 85 128* 97 119*  BUN 48* 49*   < > 69* 59* 92* 82*  CREATININE 1.58* 1.77*   < > 2.08* 2.10* 1.80* 1.53*  CALCIUM 8.4* 9.9   < > 9.5 9.2 9.3 9.4  GFRNONAA 29*  --   --  21* 20* 27* 32*  GFRAA 33*  --   --  24* 24*  --   --   PROT 6.5 6.9   < >  --  6.5 6.8 6.9  ALBUMIN 3.9 4.0   < >  --  4.4 4.0 4.1  AST 14* 15   < >  --  11 18 15   ALT 41 18   < >  --  29 19 20   ALKPHOS 88 85   < >  --  92 69 87  BILITOT 0.6 0.5   < >  --  0.3 0.6 0.6  BILIDIR  --  0.2  --   --   --   --   --    < > = values in this interval not displayed.   Iron/TIBC/Ferritin/ %Sat    Component Value Date/Time   IRON 62 05/08/2020 1312   TIBC 517 (H) 05/08/2020 1312   FERRITIN 24 05/08/2020 1312   IRONPCTSAT 12 05/08/2020 1312     RADIOGRAPHIC STUDIES: I have personally reviewed the radiological images as listed and agreed with the findings in the report. 06/17/2018 Ultrasound abdomen complete Heterogeneous echotexture in the liver.  Extrahepatic and intrahepatic.  Duct dilation, stable since prior study. Small hyperechoic area within the spleen the largest 7 mm.  Compatible with a hemangioma.  Stable Prior cholecystectomy. Mildly increased texture in the kidney suggest chronic kidney disease.  ASSESSMENT & PLAN:  1. Iron deficiency anemia due to chronic blood loss   2. Macrocytosis    #Iron deficiency anemia Labs are reviewed and discussed with patient. Patient has stable hemoglobin count at 12.9. Stable borderline iron store with normal hemoglobin.   IV Feraheme at this point.  Most likely she may need in the future.   #Macrocytosis, MCV 105.  ?  Underlying MDS.  Continue observation. Vitamin B12 level is elevated in 3000.  Advised  patient to hold B12 supplementation for 2 to 3 weeks.  Resume over-the-counter vitamin B12 500 MCG weekly. #History of autoimmune hepatitis/PB C, continue follow-up with gastroenterology.  Orders Placed This Encounter  Procedures  . Folate    Standing Status:   Future    Standing Expiration Date:   05/09/2021  . Ferritin    Standing Status:   Future    Standing Expiration Date:   05/09/2021  . Iron and TIBC    Standing Status:   Future    Standing Expiration Date:   05/09/2021  . Vitamin B12    Standing Status:   Future    Standing Expiration Date:   05/09/2021  . CBC with Differential/Platelet    Standing Status:   Future    Standing Expiration Date:   05/09/2021    All questions were answered. The patient knows to call the clinic with any problems questions or concerns. We spent sufficient time to  discuss many aspect of care, questions were answered to patient's satisfaction.   Return of visit: 4 months   Earlie Server, MD, PhD Hematology Oncology [facility] Pager- [phone] 05/09/2020

## 2020-05-09 NOTE — Progress Notes (Signed)
Pt here for follow up. No new concerns voiced.   

## 2020-05-16 ENCOUNTER — Ambulatory Visit (INDEPENDENT_AMBULATORY_CARE_PROVIDER_SITE_OTHER): Payer: Medicare Other

## 2020-05-16 VITALS — Ht 63.0 in | Wt 141.0 lb

## 2020-05-16 DIAGNOSIS — Z0000 Encounter for general adult medical examination without abnormal findings: Secondary | ICD-10-CM

## 2020-05-16 NOTE — Patient Instructions (Addendum)
Ruth Roth , Thank you for taking time to come for your Medicare Wellness Visit. I appreciate your ongoing commitment to your health goals. Please review the following plan we discussed and let me know if I can assist you in the future.   These are the goals we discussed: Goals      Patient Stated   .  Increase physical activity (pt-stated)       This is a list of the screening recommended for you and due dates:  Health Maintenance  Topic Date Due  . Tetanus Vaccine  08/16/2020  . Mammogram  09/14/2020  . Flu Shot  Completed  . DEXA scan (bone density measurement)  Completed  . COVID-19 Vaccine  Completed  . Pneumonia vaccines  Completed  . HPV Vaccine  Aged Out    Immunizations Immunization History  Administered Date(s) Administered  . Fluad Quad(high Dose 65+) 02/12/2019, 01/28/2020  . Hepb-cpg 07/13/2019  . Influenza, High Dose Seasonal PF 01/23/2016, 01/21/2017, 01/30/2018  . Influenza,inj,Quad PF,6+ Mos 12/24/2012, 01/11/2014, 01/16/2015  . Influenza-Unspecified 12/10/2011  . Moderna Sars-Covid-2 Vaccination 03/23/2019, 04/24/2019, 01/10/2020  . Pneumococcal Conjugate-13 02/22/2014  . Pneumococcal Polysaccharide-23 07/30/2016  . Td 08/17/2010   Keep all routine maintenance appointments.   Follow up 06/01/20 @ 1:30  Advanced directives: End of life planning; Advance aging; Advanced directives discussed.  Copy of current HCPOA/Living Will requested.    Conditions/risks identified: none new.  Follow up in one year for your annual wellness visit.   Preventive Care 37 Years and Older, Female Preventive care refers to lifestyle choices and visits with your health care provider that can promote health and wellness. What does preventive care include?  A yearly physical exam. This is also called an annual well check.  Dental exams once or twice a year.  Routine eye exams. Ask your health care provider how often you should have your eyes checked.  Personal lifestyle  choices, including:  Daily care of your teeth and gums.  Regular physical activity.  Eating a healthy diet.  Avoiding tobacco and drug use.  Limiting alcohol use.  Practicing safe sex.  Taking low-dose aspirin every day.  Taking vitamin and mineral supplements as recommended by your health care provider. What happens during an annual well check? The services and screenings done by your health care provider during your annual well check will depend on your age, overall health, lifestyle risk factors, and family history of disease. Counseling  Your health care provider may ask you questions about your:  Alcohol use.  Tobacco use.  Drug use.  Emotional well-being.  Home and relationship well-being.  Sexual activity.  Eating habits.  History of falls.  Memory and ability to understand (cognition).  Work and work Statistician.  Reproductive health. Screening  You may have the following tests or measurements:  Height, weight, and BMI.  Blood pressure.  Lipid and cholesterol levels. These may be checked every 5 years, or more frequently if you are over 86 years old.  Skin check.  Lung cancer screening. You may have this screening every year starting at age 32 if you have a 30-pack-year history of smoking and currently smoke or have quit within the past 15 years.  Fecal occult blood test (FOBT) of the stool. You may have this test every year starting at age 37.  Flexible sigmoidoscopy or colonoscopy. You may have a sigmoidoscopy every 5 years or a colonoscopy every 10 years starting at age 9.  Hepatitis C blood test.  Hepatitis  B blood test.  Sexually transmitted disease (STD) testing.  Diabetes screening. This is done by checking your blood sugar (glucose) after you have not eaten for a while (fasting). You may have this done every 1-3 years.  Bone density scan. This is done to screen for osteoporosis. You may have this done starting at age  68.  Mammogram. This may be done every 1-2 years. Talk to your health care provider about how often you should have regular mammograms. Talk with your health care provider about your test results, treatment options, and if necessary, the need for more tests. Vaccines  Your health care provider may recommend certain vaccines, such as:  Influenza vaccine. This is recommended every year.  Tetanus, diphtheria, and acellular pertussis (Tdap, Td) vaccine. You may need a Td booster every 10 years.  Zoster vaccine. You may need this after age 72.  Pneumococcal 13-valent conjugate (PCV13) vaccine. One dose is recommended after age 65.  Pneumococcal polysaccharide (PPSV23) vaccine. One dose is recommended after age 42. Talk to your health care provider about which screenings and vaccines you need and how often you need them. This information is not intended to replace advice given to you by your health care provider. Make sure you discuss any questions you have with your health care provider. Document Released: 03/24/2015 Document Revised: 11/15/2015 Document Reviewed: 12/27/2014 Elsevier Interactive Patient Education  2017 [employer] Prevention in the Home Falls can cause injuries. They can happen to people of all ages. There are many things you can do to make your home safe and to help prevent falls. What can I do on the outside of my home?  Regularly fix the edges of walkways and driveways and fix any cracks.  Remove anything that might make you trip as you walk through a door, such as a raised step or threshold.  Trim any bushes or trees on the path to your home.  Use bright outdoor lighting.  Clear any walking paths of anything that might make someone trip, such as rocks or tools.  Regularly check to see if handrails are loose or broken. Make sure that both sides of any steps have handrails.  Any raised decks and porches should have guardrails on the edges.  Have any  leaves, snow, or ice cleared regularly.  Use sand or salt on walking paths during winter.  Clean up any spills in your garage right away. This includes oil or grease spills. What can I do in the bathroom?  Use night lights.  Install grab bars by the toilet and in the tub and shower. Do not use towel bars as grab bars.  Use non-skid mats or decals in the tub or shower.  If you need to sit down in the shower, use a plastic, non-slip stool.  Keep the floor dry. Clean up any water that spills on the floor as soon as it happens.  Remove soap buildup in the tub or shower regularly.  Attach bath mats securely with double-sided non-slip rug tape.  Do not have throw rugs and other things on the floor that can make you trip. What can I do in the bedroom?  Use night lights.  Make sure that you have a light by your bed that is easy to reach.  Do not use any sheets or blankets that are too big for your bed. They should not hang down onto the floor.  Have a firm chair that has side arms. You can use this for  support while you get dressed.  Do not have throw rugs and other things on the floor that can make you trip. What can I do in the kitchen?  Clean up any spills right away.  Avoid walking on wet floors.  Keep items that you use a lot in easy-to-reach places.  If you need to reach something above you, use a strong step stool that has a grab bar.  Keep electrical cords out of the way.  Do not use floor polish or wax that makes floors slippery. If you must use wax, use non-skid floor wax.  Do not have throw rugs and other things on the floor that can make you trip. What can I do with my stairs?  Do not leave any items on the stairs.  Make sure that there are handrails on both sides of the stairs and use them. Fix handrails that are broken or loose. Make sure that handrails are as long as the stairways.  Check any carpeting to make sure that it is firmly attached to the stairs.  Fix any carpet that is loose or worn.  Avoid having throw rugs at the top or bottom of the stairs. If you do have throw rugs, attach them to the floor with carpet tape.  Make sure that you have a light switch at the top of the stairs and the bottom of the stairs. If you do not have them, ask someone to add them for you. What else can I do to help prevent falls?  Wear shoes that:  Do not have high heels.  Have rubber bottoms.  Are comfortable and fit you well.  Are closed at the toe. Do not wear sandals.  If you use a stepladder:  Make sure that it is fully opened. Do not climb a closed stepladder.  Make sure that both sides of the stepladder are locked into place.  Ask someone to hold it for you, if possible.  Clearly mark and make sure that you can see:  Any grab bars or handrails.  First and last steps.  Where the edge of each step is.  Use tools that help you move around (mobility aids) if they are needed. These include:  Canes.  Walkers.  Scooters.  Crutches.  Turn on the lights when you go into a dark area. Replace any light bulbs as soon as they burn out.  Set up your furniture so you have a clear path. Avoid moving your furniture around.  If any of your floors are uneven, fix them.  If there are any pets around you, be aware of where they are.  Review your medicines with your doctor. Some medicines can make you feel dizzy. This can increase your chance of falling. Ask your doctor what other things that you can do to help prevent falls. This information is not intended to replace advice given to you by your health care provider. Make sure you discuss any questions you have with your health care provider. Document Released: 12/22/2008 Document Revised: 08/03/2015 Document Reviewed: 04/01/2014 Elsevier Interactive Patient Education  2017 [employer].

## 2020-05-16 NOTE — Progress Notes (Signed)
Subjective:   Ruth Roth is a 87 y.o. female who presents for Medicare Annual (Subsequent) preventive examination.  Review of Systems    No ROS.  Medicare Wellness Virtual Visit.   Cardiac Risk Factors include: advanced age (>88men, >26 women)     Objective:    Today's Vitals   05/16/20 1137  Weight: 141 lb (64 kg)  Height: 5\' 3"  (1.6 m)   Body mass index is 24.98 kg/m.  Advanced Directives 05/16/2020 05/09/2020 01/05/2020 07/09/2019 05/13/2019 03/16/2019 01/08/2019  Does Patient Have a Medical Advance Directive? Yes Yes Yes Yes Yes Yes Yes  Type of Advance Directive Healthcare Power of [place] -  Does patient want to make changes to medical advance directive? No - Patient declined - - - No - Patient declined - -  Copy of [place] in Chart? No - copy requested - - No - copy requested No - copy requested - -  Would patient like information on creating a medical advance directive? - - - - - - -    Current Medications (verified) Outpatient Encounter Medications as of 05/16/2020  Medication Sig  . calcitRIOL (ROCALTROL) 0.25 MCG capsule Take 0.25 mcg by mouth daily.   . [facility] VITAMIN B12 1000 MCG tablet TAKE 1 TABLET BY MOUTH EVERY DAY  . dorzolamide (TRUSOPT) 2 % ophthalmic solution Place 1 drop into the left eye 2 (two) times daily.  . fluticasone (FLONASE) 50 MCG/ACT nasal spray Place 2 sprays into both nostrils daily as needed for allergies.   Marland Kitchen gabapentin (NEURONTIN) 300 MG capsule One capsule tid and two capsules q hs.  . hydroxychloroquine (PLAQUENIL) 200 MG tablet Take 1 tablet by mouth daily.  Marland Kitchen ipratropium (ATROVENT) 0.06 % nasal spray PLACE 1 SPRAY INTO BOTH NOSTRILS DAILY AS NEEDED FOR RHINITIS.  Marland Kitchen latanoprost (XALATAN) 0.005 % ophthalmic solution Place 1 drop into both eyes at  bedtime.   Marland Kitchen levothyroxine (SYNTHROID) 112 MCG tablet Take 112 mcg by mouth daily before breakfast.  . losartan (COZAAR) 25 MG tablet Take 25 mg by mouth daily.  . metoprolol tartrate (LOPRESSOR) 100 MG tablet Take 1 tablet (100 mg total) by mouth 2 (two) times daily.  . Multiple Vitamin (MULTI-VITAMIN) tablet Take 1 tablet by mouth at bedtime.   . pantoprazole (PROTONIX) 20 MG tablet TAKE 1 TABLET BY MOUTH EVERY DAY  . Rivaroxaban (XARELTO) 15 MG TABS tablet TAKE 1 TABLET (15 MG TOTAL) BY MOUTH DAILY WITH SUPPER.  Marland Kitchen torsemide (DEMADEX) 20 MG tablet Alternate (2) tablets 40 mg daily and then (1) tablet 20 mg daily the next day.  . traMADol (ULTRAM) 50 MG tablet Take 1 tablet (50 mg total) by mouth 2 (two) times daily as needed (for pain.).  Marland Kitchen ursodiol (ACTIGALL) 300 MG capsule Take 300-600 mg by mouth See admin instructions. Take 2 capsules (600 mg) daily in the morning   & 1 capsule (300 mg) at night.   No facility-administered encounter medications on file as of 05/16/2020.    Allergies (verified) Statins, Haldol [haloperidol lactate], Librax [chlordiazepoxide-clidinium], Plavix [clopidogrel bisulfate], and Ramipril   History: Past Medical History:  Diagnosis Date  . (HFpEF) heart failure with preserved ejection fraction ([place])    a. 07/2017 Echo: EF 50-55%, no rwma, mild AS/AI, mod MR, mod dil LA, mod TR, PASP 103mmHg.  . Autoimmune hepatitis ([place])  followed by Dr [name]  . Fibrocystic breast disease   . Glaucoma   . Hypercholesterolemia   . Hypertension   . Hypothyroidism   . Inflammatory arthritis   . MI (myocardial infarction) ([place])   . Neuropathy   . Non-obstructive Coronary artery disease    a. 05/2014 NSTEMI/Cath: LM nl, LAD mild dzs, LCX 60ost hazy (? culprit), RCA mild dzs. EF 60% by echo-->Med Rx; b. 08/2017 Cath: LM 30ost, LAD min irregs, LCX small, 40ost/p, RCA large, min irregs, RPDA/RPL1 nl, EF 50-55%. 4+MR.  . Osteoarthritis   . Permanent atrial fibrillation (HCC)     a. CHA2DS2VASc = 6-->Xarelto.  . 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 Severe mitral regurgitation    a. 07/2017 Echo: Mod MR; b. 08/2017 Cath: 4+/Severe MR.   Past Surgical History:  Procedure Laterality Date  . ABDOMINAL HYSTERECTOMY  1963   partial, secondary to fibroids  . APPENDECTOMY    . Billroth    . CARDIAC CATHETERIZATION  05/12/14   [facility]  . CARDIOVERSION N/A 12/26/2016   Procedure: CARDIOVERSION;  Surgeon: [name], MD;  Location: [facility] ORS;  Service: Cardiovascular;  Laterality: N/A;  . CARDIOVERSION N/A 05/16/2017   Procedure: CARDIOVERSION;  Surgeon: [name], MD;  Location: [facility] ORS;  Service: Cardiovascular;  Laterality: N/A;  . CARDIOVERSION N/A 06/09/2017   Procedure: CARDIOVERSION;  Surgeon: [name], MD;  Location: [facility] ORS;  Service: Cardiovascular;  Laterality: N/A;  . CARDIOVERSION N/A 11/30/2018   Procedure: CARDIOVERSION;  Surgeon: [name], MD;  Location: [facility] ORS;  Service: Cardiovascular;  Laterality: N/A;  . CHOLECYSTECTOMY  1998  . COLONOSCOPY  2009  . RIGHT/LEFT HEART CATH AND CORONARY ANGIOGRAPHY Bilateral 08/21/2017   Procedure: RIGHT/LEFT HEART CATH AND CORONARY ANGIOGRAPHY;  Surgeon: [name], MD;  Location: [facility];  Service: Cardiovascular;  Laterality: Bilateral;  . TEE WITHOUT CARDIOVERSION N/A 08/25/2017   Procedure: TRANSESOPHAGEAL ECHOCARDIOGRAM (TEE);  Surgeon: Jolaine Artist, MD;  Location: [facility] ENDOSCOPY;  Service: Cardiovascular;  Laterality: N/A;  . UPPER GI ENDOSCOPY  2004   Family History  Problem Relation Age of Onset  . Stroke Mother   . Esophageal cancer Brother        also had lung cancer  . Ovarian cancer Daughter   . Colon cancer Daughter   . Breast cancer Neg Hx    Social History   Socioeconomic History  . Marital status: Widowed    Spouse name: Not on file  . Number of children: 2  . Years of education: Not on  file  . Highest education level: Not on file  Occupational History  . Occupation: retired  Tobacco Use  . Smoking status: Never Smoker  . Smokeless tobacco: Never Used  Vaping Use  . Vaping Use: Never used  Substance and Sexual Activity  . Alcohol use: No    Alcohol/week: 0.0 standard drinks  . Drug use: No  . Sexual activity: Never  Other Topics Concern  . Not on file  Social History Narrative  . Not on file   Social Determinants of Health   Financial Resource Strain: Low Risk   . Difficulty of Paying Living Expenses: Not hard at all  Food Insecurity: No Food Insecurity  . Worried About Charity fundraiser in the Last Year: Never true  . Ran Out of Food in the Last Year: Never true  Transportation Needs: No Transportation  Needs  . Lack of Transportation (Medical): No  . Lack of Transportation (Non-Medical): No  Physical Activity: Insufficiently Active  . Days of Exercise per Week: 3 days  . Minutes of Exercise per Session: 40 min  Stress: No Stress Concern Present  . Feeling of Stress : Not at all  Social Connections: Unknown  . Frequency of Communication with Friends and Family: More than three times a week  . Frequency of Social Gatherings with Friends and Family: More than three times a week  . Attends Religious Services: Not on file  . Active Member of Clubs or Organizations: Not on file  . Attends Archivist Meetings: Not on file  . Marital Status: Not on file    Tobacco Counseling Counseling given: Not Answered   Clinical Intake:  Pre-visit preparation completed: Yes        Diabetes: No  How often do you need to have someone help you when you read instructions, pamphlets, or other written materials from your doctor or pharmacy?: 1 - Never    Interpreter Needed?: No    Activities of Daily Living In your present state of health, do you have any difficulty performing the following activities: 05/16/2020  Hearing? N  Vision? N  Difficulty  concentrating or making decisions? N  Walking or climbing stairs? N  Dressing or bathing? N  Doing errands, shopping? N  Preparing Food and eating ? N  Using the Toilet? N  In the past six months, have you accidently leaked urine? Y  Comment Managed with daily liner  Do you have problems with loss of bowel control? N  Managing your Medications? N  Managing your Finances? N  Housekeeping or managing your Housekeeping? N  Some recent data might be hidden    Patient Care Team: Einar Pheasant, MD as PCP - General (Unknown Physician Specialty) [name], MD as PCP - Cardiology (Cardiology) Bensimhon, Shaune Pascal, MD as PCP - Advanced Heart Failure (Cardiology) Bary Castilla Forest Gleason, MD (General Surgery) Earlie Server, MD as Consulting Physician (Oncology)  Indicate any recent Medical Services you may have received from other than [facility] providers in the past year (date may be approximate).     Assessment:   This is a routine wellness examination for Aldine.  I connected with Madisynn today by telephone and verified that I am speaking with the correct person using two identifiers. Location patient: home Location provider: work Persons participating in the virtual visit: patient, Marine scientist.    I discussed the limitations, risks, security and privacy concerns of performing an evaluation and management service by telephone and the availability of in person appointments. The patient expressed understanding and verbally consented to this telephonic visit.    Interactive audio and video telecommunications were attempted between this provider and patient, however failed, due to patient having technical difficulties OR patient did not have access to video capability.  We continued and completed visit with audio only.  Some vital signs may be absent or patient reported.   Hearing/Vision screen  Hearing Screening   125Hz  250Hz  500Hz  1000Hz  2000Hz  3000Hz  4000Hz  6000Hz  8000Hz   Right ear:           Left  ear:           Comments: Patient is able to hear conversational tones without difficulty.  No issues reported.   Vision Screening Comments: Followed by [facility]  Wears corrective lenses  Cataract extraction, bilateral  Visual acuity not assessed per patient preference since  they have regular follow up with the ophthalmologist    Dietary issues and exercise activities discussed: Current Exercise Habits: Home exercise routine, Type of exercise: walking, Intensity: Mild  Healthy diet Good water intake  Goals      Patient Stated   .  Increase physical activity (pt-stated)      Depression Screen PHQ 2/9 Scores 05/16/2020 05/13/2019 05/22/2018 04/23/2018 03/27/2018 02/16/2018 11/25/2017  PHQ - 2 Score 0 0 0 0 0 3 1  PHQ- 9 Score - - 0 - 2 8 6     Fall Risk Fall Risk  05/16/2020 05/13/2019 04/23/2018 02/16/2018 11/25/2017  Falls in the past year? 0 0 0 1 Yes  Number falls in past yr: 0 - - 1 1  Injury with Fall? 0 - - 1 Yes  Risk Factor Category  - - - High Risk (2 or more Points) High Fall Risk  Risk for fall due to : - - - Impaired balance/gait Impaired balance/gait;Other (Comment)  Risk for fall due to: Comment - - - - patient states she hit her head recently from being too dizzy  Follow up Falls evaluation completed Falls evaluation completed - Falls evaluation completed;Education provided;Falls prevention discussed;Follow up appointment Falls evaluation completed;Education provided;Falls prevention discussed;Follow up appointment    FALL RISK PREVENTION PERTAINING TO THE HOME: Handrails in use when climbing stairs? Yes Home free of loose throw rugs in walkways, pet beds, electrical cords, etc? Yes  Adequate lighting in your home to reduce risk of falls? Yes   ASSISTIVE DEVICES UTILIZED TO PREVENT FALLS: Life alert? No  Use of a cane, walker or w/c? No   TIMED UP AND GO: Was the test performed? No . Virtual visit.   Cognitive Function: MMSE - Mini Mental State Exam 04/19/2015   Orientation to time 5  Orientation to Place 5  Registration 3  Attention/ Calculation 5  Recall 3  Language- name 2 objects 2  Language- repeat 1  Language- follow 3 step command 3  Language- read & follow direction 1  Write a sentence 1  Copy design 1  Total score 30     6CIT Screen 05/16/2020 05/13/2019 04/23/2018 04/21/2017 04/18/2016  What Year? 0 points 0 points 0 points 0 points 0 points  What month? 0 points 0 points 0 points 0 points 0 points  What time? 0 points 0 points 0 points 0 points 0 points  Count back from 20 0 points 0 points 0 points 0 points 0 points  Months in reverse 0 points 0 points 0 points 0 points 0 points  Repeat phrase - 0 points 0 points 0 points 0 points  Total Score - 0 0 0 0    Immunizations Immunization History  Administered Date(s) Administered  . Fluad Quad(high Dose 65+) 02/12/2019, 01/28/2020  . Hepb-cpg 07/13/2019  . Influenza, High Dose Seasonal PF 01/23/2016, 01/21/2017, 01/30/2018  . Influenza,inj,Quad PF,6+ Mos 12/24/2012, 01/11/2014, 01/16/2015  . Influenza-Unspecified 12/10/2011  . Moderna Sars-Covid-2 Vaccination 03/23/2019, 04/24/2019, 01/10/2020  . Pneumococcal Conjugate-13 02/22/2014  . Pneumococcal Polysaccharide-23 07/30/2016  . Td 08/17/2010    Health Maintenance There are no preventive care reminders to display for this patient. Health Maintenance  Topic Date Due  . TETANUS/TDAP  08/16/2020  . MAMMOGRAM  09/14/2020  . INFLUENZA VACCINE  Completed  . DEXA SCAN  Completed  . COVID-19 Vaccine  Completed  . PNA vac Low Risk Adult  Completed  . HPV VACCINES  Aged Out    Colorectal cancer screening: No  longer required.    Mammogram status: Completed 09/15/19. Repeat every year  Lung Cancer Screening: (Low Dose CT Chest recommended if Age 60-80 years, 30 pack-year currently smoking OR have quit w/in 15years.) does not qualify.   Vision Screening: Recommended annual ophthalmology exams for early detection of glaucoma and  other disorders of the eye. Is the patient up to date with their annual eye exam?  Yes   Dental Screening: Recommended annual dental exams for proper oral hygiene.  Community Resource Referral / Chronic Care Management: CRR required this visit?  No   CCM required this visit?  No      Plan:   Keep all routine maintenance appointments.   Follow up 06/01/20 @ 1:30  I have personally reviewed and noted the following in the patient's chart:   . Medical and social history . Use of alcohol, tobacco or illicit drugs  . Current medications and supplements . Functional ability and status . Nutritional status . Physical activity . Advanced directives . List of other physicians . Hospitalizations, surgeries, and ER visits in previous 12 months . Vitals . Screenings to include cognitive, depression, and falls . Referrals and appointments  In addition, I have reviewed and discussed with patient certain preventive protocols, quality metrics, and best practice recommendations. A written personalized care plan for preventive services as well as general preventive health recommendations were provided to patient via mail.     Varney Biles, LPN   08/14/628

## 2020-05-23 DIAGNOSIS — D631 Anemia in chronic kidney disease: Secondary | ICD-10-CM | POA: Diagnosis not present

## 2020-05-23 DIAGNOSIS — N2581 Secondary hyperparathyroidism of renal origin: Secondary | ICD-10-CM | POA: Diagnosis not present

## 2020-05-23 DIAGNOSIS — I10 Essential (primary) hypertension: Secondary | ICD-10-CM | POA: Diagnosis not present

## 2020-05-23 DIAGNOSIS — N1832 Chronic kidney disease, stage 3b: Secondary | ICD-10-CM | POA: Diagnosis not present

## 2020-05-23 DIAGNOSIS — N179 Acute kidney failure, unspecified: Secondary | ICD-10-CM | POA: Diagnosis not present

## 2020-06-01 ENCOUNTER — Ambulatory Visit (INDEPENDENT_AMBULATORY_CARE_PROVIDER_SITE_OTHER): Payer: Medicare Other | Admitting: Internal Medicine

## 2020-06-01 ENCOUNTER — Telehealth: Payer: Self-pay | Admitting: Internal Medicine

## 2020-06-01 ENCOUNTER — Encounter: Payer: Self-pay | Admitting: Internal Medicine

## 2020-06-01 ENCOUNTER — Other Ambulatory Visit: Payer: Self-pay

## 2020-06-01 DIAGNOSIS — E039 Hypothyroidism, unspecified: Secondary | ICD-10-CM | POA: Diagnosis not present

## 2020-06-01 DIAGNOSIS — G6289 Other specified polyneuropathies: Secondary | ICD-10-CM

## 2020-06-01 DIAGNOSIS — I340 Nonrheumatic mitral (valve) insufficiency: Secondary | ICD-10-CM

## 2020-06-01 DIAGNOSIS — I5032 Chronic diastolic (congestive) heart failure: Secondary | ICD-10-CM

## 2020-06-01 DIAGNOSIS — N183 Chronic kidney disease, stage 3 unspecified: Secondary | ICD-10-CM

## 2020-06-01 DIAGNOSIS — D500 Iron deficiency anemia secondary to blood loss (chronic): Secondary | ICD-10-CM

## 2020-06-01 DIAGNOSIS — K743 Primary biliary cirrhosis: Secondary | ICD-10-CM | POA: Diagnosis not present

## 2020-06-01 DIAGNOSIS — I4819 Other persistent atrial fibrillation: Secondary | ICD-10-CM | POA: Diagnosis not present

## 2020-06-01 DIAGNOSIS — M810 Age-related osteoporosis without current pathological fracture: Secondary | ICD-10-CM

## 2020-06-01 DIAGNOSIS — K219 Gastro-esophageal reflux disease without esophagitis: Secondary | ICD-10-CM

## 2020-06-01 DIAGNOSIS — E7800 Pure hypercholesterolemia, unspecified: Secondary | ICD-10-CM | POA: Diagnosis not present

## 2020-06-01 DIAGNOSIS — I272 Pulmonary hypertension, unspecified: Secondary | ICD-10-CM

## 2020-06-01 DIAGNOSIS — D509 Iron deficiency anemia, unspecified: Secondary | ICD-10-CM

## 2020-06-01 DIAGNOSIS — I2510 Atherosclerotic heart disease of native coronary artery without angina pectoris: Secondary | ICD-10-CM

## 2020-06-01 DIAGNOSIS — K754 Autoimmune hepatitis: Secondary | ICD-10-CM

## 2020-06-01 MED ORDER — TRAMADOL HCL 50 MG PO TABS: 50.0000 mg | ORAL_TABLET | Freq: Two times a day (BID) | ORAL | 0 refills | Status: DC | PRN

## 2020-06-01 NOTE — Telephone Encounter (Signed)
Letter written for mailbox to be moved.

## 2020-06-01 NOTE — Progress Notes (Signed)
[name], female   DOB: Jun 18, 1928, 87 y.o.   MRN: [id number]   Subjective:    [name], female    DOB: 1928/05/11, 87 y.o.   MRN: [id number]  HPI This visit occurred during the SARS-CoV-2 public health emergency.  Safety protocols were in place, including screening questions prior to the visit, additional usage of staff PPE, and extensive cleaning of exam room while observing appropriate contact time as indicated for disinfecting solutions.  Patient here for a scheduled follow up.  Here to follow up regarding her blood pressure and cholesterol.  Seeing nephrology for CKD. Off losartan. Metoprolol dose increased.  Kidney function stable.  Seeing hematology for f/u hgb.  Last 13.4.  Tries to stay active.  No chest pain.  Breathing stable.  Taking stool softener.  Last bowel movement - yesterday. Pain - right knee - hip down.  Taking tramadol one per day.  States needs to be able to get around and function.  Sees Dr Jefm Bryant.     Past Medical History:  Diagnosis Date  . (HFpEF) heart failure with preserved ejection fraction ([place])    a. 07/2017 Echo: EF 50-55%, no rwma, mild AS/AI, mod MR, mod dil LA, mod TR, PASP 35mmHg.  . Autoimmune hepatitis ([place])    followed by Dr [name]  . Fibrocystic breast disease   . Glaucoma   . Hypercholesterolemia   . Hypertension   . Hypothyroidism   . Inflammatory arthritis   . MI (myocardial infarction) ([place])   . Neuropathy   . Non-obstructive Coronary artery disease    a. 05/2014 NSTEMI/Cath: LM nl, LAD mild dzs, LCX 60ost hazy (? culprit), RCA mild dzs. EF 60% by echo-->Med Rx; b. 08/2017 Cath: LM 30ost, LAD min irregs, LCX small, 40ost/p, RCA large, min irregs, RPDA/RPL1 nl, EF 50-55%. 4+MR.  . Osteoarthritis   . Permanent atrial fibrillation (HCC)    a. CHA2DS2VASc = 6-->Xarelto.  . 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 Severe mitral regurgitation    a. 07/2017 Echo:  Mod MR; b. 08/2017 Cath: 4+/Severe MR.   Past Surgical History:  Procedure Laterality Date  . ABDOMINAL HYSTERECTOMY  1963   partial, secondary to fibroids  . APPENDECTOMY    . Billroth    . CARDIAC CATHETERIZATION  05/12/14   [facility]  . CARDIOVERSION N/A 12/26/2016   Procedure: CARDIOVERSION;  Surgeon: [name], MD;  Location: [facility] ORS;  Service: Cardiovascular;  Laterality: N/A;  . CARDIOVERSION N/A 05/16/2017   Procedure: CARDIOVERSION;  Surgeon: [name], MD;  Location: [facility] ORS;  Service: Cardiovascular;  Laterality: N/A;  . CARDIOVERSION N/A 06/09/2017   Procedure: CARDIOVERSION;  Surgeon: [name], MD;  Location: [facility] ORS;  Service: Cardiovascular;  Laterality: N/A;  . CARDIOVERSION N/A 11/30/2018   Procedure: CARDIOVERSION;  Surgeon: [name], MD;  Location: [facility] ORS;  Service: Cardiovascular;  Laterality: N/A;  . CHOLECYSTECTOMY  1998  . COLONOSCOPY  2009  . RIGHT/LEFT HEART CATH AND CORONARY ANGIOGRAPHY Bilateral 08/21/2017   Procedure: RIGHT/LEFT HEART CATH AND CORONARY ANGIOGRAPHY;  Surgeon: [name], MD;  Location: [facility];  Service: Cardiovascular;  Laterality: Bilateral;  . TEE WITHOUT CARDIOVERSION N/A 08/25/2017   Procedure: TRANSESOPHAGEAL ECHOCARDIOGRAM (TEE);  Surgeon: Jolaine Artist, MD;  Location: [facility] END[facility]COPY;  Service: Cardiovascular;  Laterality: N/A;  . UPPER GI ENDOSCOPY  2004   Family History  Problem Relation  Age of Onset  . Stroke Mother   . Esophageal cancer Brother        also had lung cancer  . Ovarian cancer Daughter   . Colon cancer Daughter   . Breast cancer Neg Hx    Social History   Socioeconomic History  . Marital status: Widowed    Spouse name: Not on file  . Number of children: 2  . Years of education: Not on file  . Highest education level: Not on file  Occupational History  . Occupation: retired  Tobacco Use  . Smoking status: Never Smoker  . Smokeless tobacco: Never Used   Vaping Use  . Vaping Use: Never used  Substance and Sexual Activity  . Alcohol use: No    Alcohol/week: 0.0 standard drinks  . Drug use: No  . Sexual activity: Never  Other Topics Concern  . Not on file  Social History Narrative  . Not on file   Social Determinants of Health   Financial Resource Strain: Low Risk   . Difficulty of Paying Living Expenses: Not hard at all  Food Insecurity: No Food Insecurity  . Worried About Charity fundraiser in the Last Year: Never true  . Ran Out of Food in the Last Year: Never true  Transportation Needs: No Transportation Needs  . Lack of Transportation (Medical): No  . Lack of Transportation (Non-Medical): No  Physical Activity: Insufficiently Active  . Days of Exercise per Week: 3 days  . Minutes of Exercise per Session: 40 min  Stress: No Stress Concern Present  . Feeling of Stress : Not at all  Social Connections: Unknown  . Frequency of Communication with Friends and Family: More than three times a week  . Frequency of Social Gatherings with Friends and Family: More than three times a week  . Attends Religious Services: Not on file  . Active Member of Clubs or Organizations: Not on file  . Attends Archivist Meetings: Not on file  . Marital Status: Not on file    Outpatient Encounter Medications as of 06/01/2020  Medication Sig  . [employer] VITAMIN B12 1000 MCG tablet TAKE 1 TABLET BY MOUTH EVERY DAY  . dorzolamide (TRUSOPT) 2 % ophthalmic solution Place 1 drop into the left eye 2 (two) times daily.  . fluticasone (FLONASE) 50 MCG/ACT nasal spray Place 2 sprays into both nostrils daily as needed for allergies.   Marland Kitchen gabapentin (NEURONTIN) 300 MG capsule One capsule tid and two capsules q hs.  . hydroxychloroquine (PLAQUENIL) 200 MG tablet Take 1 tablet by mouth daily.  Marland Kitchen ipratropium (ATROVENT) 0.06 % nasal spray PLACE 1 SPRAY INTO BOTH NOSTRILS DAILY AS NEEDED FOR RHINITIS.  Marland Kitchen latanoprost (XALATAN) 0.005 % ophthalmic solution  Place 1 drop into both eyes at bedtime.   Marland Kitchen levothyroxine (SYNTHROID) 112 MCG tablet Take 112 mcg by mouth daily before breakfast.  . metoprolol tartrate (LOPRESSOR) 100 MG tablet Take 1 tablet (100 mg total) by mouth 2 (two) times daily.  . Multiple Vitamin (MULTI-VITAMIN) tablet Take 1 tablet by mouth at bedtime.   . pantoprazole (PROTONIX) 20 MG tablet TAKE 1 TABLET BY MOUTH EVERY DAY  . Rivaroxaban (XARELTO) 15 MG TABS tablet TAKE 1 TABLET (15 MG TOTAL) BY MOUTH DAILY WITH SUPPER.  Marland Kitchen torsemide (DEMADEX) 20 MG tablet Alternate (2) tablets 40 mg daily and then (1) tablet 20 mg daily the next day.  . traMADol (ULTRAM) 50 MG tablet Take 1 tablet (50 mg total) by mouth  2 (two) times daily as needed (for pain.).  Marland Kitchen ursodiol (ACTIGALL) 300 MG capsule Take 300-600 mg by mouth See admin instructions. Take 2 capsules (600 mg) daily in the morning   & 1 capsule (300 mg) at night.  . [DISCONTINUED] calcitRIOL (ROCALTROL) 0.25 MCG capsule Take 0.25 mcg by mouth daily.   . [DISCONTINUED] losartan (COZAAR) 25 MG tablet Take 25 mg by mouth daily.  . [DISCONTINUED] traMADol (ULTRAM) 50 MG tablet Take 1 tablet (50 mg total) by mouth 2 (two) times daily as needed (for pain.).   No facility-administered encounter medications on file as of 06/01/2020.    Review of Systems  Constitutional: Negative for appetite change and unexpected weight change.  HENT: Negative for congestion and sinus pressure.   Respiratory: Negative for cough and chest tightness.        Breathing stable.   Cardiovascular: Negative for chest pain, palpitations and leg swelling.  Gastrointestinal: Negative for abdominal pain, diarrhea, nausea and vomiting.  Genitourinary: Negative for difficulty urinating and dysuria.  Musculoskeletal:       Right knee (hip to knee) pain.    Skin: Negative for color change and rash.  Neurological: Negative for dizziness, light-headedness and headaches.  Psychiatric/Behavioral: Negative for agitation and  dysphoric mood.       Objective:    Physical Exam Vitals reviewed.  Constitutional:      General: She is not in acute distress.    Appearance: Normal appearance.  HENT:     Head: Normocephalic and atraumatic.     Right Ear: External ear normal.     Left Ear: External ear normal.  Eyes:     General: No scleral icterus.       Right eye: No discharge.        Left eye: No discharge.     Conjunctiva/sclera: Conjunctivae normal.  Neck:     Thyroid: No thyromegaly.  Cardiovascular:     Rate and Rhythm: Normal rate and regular rhythm.  Pulmonary:     Effort: No respiratory distress.     Breath sounds: Normal breath sounds. No wheezing.  Abdominal:     General: Bowel sounds are normal.     Palpations: Abdomen is soft.     Tenderness: There is no abdominal tenderness.  Musculoskeletal:        General: No swelling or tenderness.     Cervical back: Neck supple. No tenderness.  Lymphadenopathy:     Cervical: No cervical adenopathy.  Skin:    Findings: No erythema or rash.  Neurological:     Mental Status: She is alert.  Psychiatric:        Mood and Affect: Mood normal.        Behavior: Behavior normal.     BP 128/74   Pulse (!) 59   Temp 97.8 F (36.6 C) (Oral)   Resp 16   Ht 5\' 3"  (1.6 m)   Wt 138 lb 6.4 oz (62.8 kg)   SpO2 99%   BMI 24.52 kg/m  Wt Readings from Last 3 Encounters:  06/01/20 138 lb 6.4 oz (62.8 kg)  05/16/20 141 lb (64 kg)  05/09/20 141 lb 3.2 oz (64 kg)     Lab Results  Component Value Date   WBC 5.8 05/08/2020   HGB 12.9 05/08/2020   HCT 41.9 05/08/2020   PLT 187 05/08/2020   GLUCOSE 119 (H) 05/08/2020   CHOL 126 07/08/2019   TRIG 124.0 07/08/2019   HDL 44.20 07/08/2019   LDLDIRECT  143.5 12/17/2012   LDLCALC 57 07/08/2019   ALT 20 05/08/2020   AST 15 05/08/2020   NA 138 05/08/2020   K 3.8 05/08/2020   CL 101 05/08/2020   CREATININE 1.53 (H) 05/08/2020   BUN 82 (H) 05/08/2020   CO2 25 05/08/2020   TSH 2.00 02/12/2019   INR 1.63  12/29/2017   HGBA1C 5.6 01/27/2018       Assessment & Plan:   Problem List Items Addressed This Visit    Anemia    Has iron deficiency.  Follow cbc and iron studies.       Autoimmune hepatitis ([place])    Follow liver function tests.        Chronic diastolic heart failure ([place])    Followed by cardiology. On torsemide and metoprlol.  Continue xarelto.  No evidence of volume overload.  Follow.  Stable.       CKD (chronic kidney disease) stage 3, GFR 30-59 ml/min (HCC)    Avoid antiinflammatories. Off losartan.  Follow metabolic panel.  Continue f/u with nephrology.       GERD (gastroesophageal reflux disease)    Upper symptoms controlled.  On protonix.       Hypercholesteremia    Unable to take statin medication.  Follow lipid panel.       Relevant Orders   TSH   Lipid panel   Hepatic function panel   Basic metabolic panel   Hypothyroidism    On thyroid replacement.  Follow tsh.       Iron deficiency anemia    Has seen hematology  - follow cbc and iron studies.       Osteoporosis    Continue prolia.       Peripheral neuropathy (Chronic)    On gabapentin.        Persistent atrial fibrillation (HCC)    On metoprolol and xarelto.  Stable.  Follow.       Primary biliary cholangitis (HCC)    Followed by GI.  Has been stable.  Follow liver function tests.        Pulmonary hypertension, unspecified ([place])    Followed by cardiology.       Severe mitral regurgitation    No evidence of volume overload.  Takes torsemide.  Follow metabolic panel.           Einar Pheasant, MD

## 2020-06-04 ENCOUNTER — Encounter: Payer: Self-pay | Admitting: Internal Medicine

## 2020-06-04 NOTE — Assessment & Plan Note (Signed)
Followed by GI.  Has been stable.  Follow liver function tests.

## 2020-06-04 NOTE — Assessment & Plan Note (Signed)
Continue prolia.  

## 2020-06-04 NOTE — Assessment & Plan Note (Signed)
On gabapentin.  

## 2020-06-04 NOTE — Assessment & Plan Note (Signed)
Follow liver function tests.   

## 2020-06-04 NOTE — Assessment & Plan Note (Signed)
Has seen hematology  - follow cbc and iron studies.

## 2020-06-04 NOTE — Assessment & Plan Note (Signed)
Upper symptoms controlled.  On protonix.  

## 2020-06-04 NOTE — Assessment & Plan Note (Signed)
Followed by cardiology. On torsemide and metoprlol.  Continue xarelto.  No evidence of volume overload.  Follow.  Stable.

## 2020-06-04 NOTE — Assessment & Plan Note (Signed)
Followed by cardiology 

## 2020-06-04 NOTE — Assessment & Plan Note (Signed)
On metoprolol and xarelto.  Stable.  Follow.

## 2020-06-04 NOTE — Assessment & Plan Note (Signed)
No evidence of volume overload.  Takes torsemide.  Follow metabolic panel.

## 2020-06-04 NOTE — Assessment & Plan Note (Signed)
Unable to take statin medication.  Follow lipid panel.  

## 2020-06-04 NOTE — Assessment & Plan Note (Signed)
On thyroid replacement.  Follow tsh.  

## 2020-06-04 NOTE — Assessment & Plan Note (Signed)
Avoid antiinflammatories. Off losartan.  Follow metabolic panel.  Continue f/u with nephrology.

## 2020-06-04 NOTE — Assessment & Plan Note (Signed)
Has iron deficiency.  Follow cbc and iron studies.  

## 2020-06-13 ENCOUNTER — Other Ambulatory Visit: Payer: Self-pay | Admitting: Cardiovascular Disease

## 2020-06-13 NOTE — Telephone Encounter (Signed)
Rx request sent to pharmacy.  

## 2020-06-21 DIAGNOSIS — H401131 Primary open-angle glaucoma, bilateral, mild stage: Secondary | ICD-10-CM | POA: Diagnosis not present

## 2020-06-22 ENCOUNTER — Other Ambulatory Visit (INDEPENDENT_AMBULATORY_CARE_PROVIDER_SITE_OTHER): Payer: Medicare Other

## 2020-06-22 ENCOUNTER — Other Ambulatory Visit: Payer: Self-pay

## 2020-06-22 DIAGNOSIS — E7800 Pure hypercholesterolemia, unspecified: Secondary | ICD-10-CM | POA: Diagnosis not present

## 2020-06-22 DIAGNOSIS — R935 Abnormal findings on diagnostic imaging of other abdominal regions, including retroperitoneum: Secondary | ICD-10-CM | POA: Diagnosis not present

## 2020-06-22 DIAGNOSIS — K743 Primary biliary cirrhosis: Secondary | ICD-10-CM | POA: Diagnosis not present

## 2020-06-22 DIAGNOSIS — Z5181 Encounter for therapeutic drug level monitoring: Secondary | ICD-10-CM | POA: Diagnosis not present

## 2020-06-22 LAB — BASIC METABOLIC PANEL
BUN: 46 mg/dL — ABNORMAL HIGH (ref 6–23)
CO2: 26 mEq/L (ref 19–32)
Calcium: 9.1 mg/dL (ref 8.4–10.5)
Chloride: 106 mEq/L (ref 96–112)
Creatinine, Ser: 1.64 mg/dL — ABNORMAL HIGH (ref 0.40–1.20)
GFR: 27.48 mL/min — ABNORMAL LOW (ref >60.00)
Glucose, Bld: 88 mg/dL (ref 70–99)
Potassium: 4.6 mEq/L (ref 3.5–5.1)
Sodium: 140 mEq/L (ref 135–145)

## 2020-06-22 LAB — LIPID PANEL
Cholesterol: 143 mg/dL (ref 0–200)
HDL: 48.4 mg/dL (ref >39.00)
LDL Cholesterol: 67 mg/dL (ref 0–99)
NonHDL: 94.82
Total CHOL/HDL Ratio: 3
Triglycerides: 139 mg/dL (ref 0.0–149.0)
VLDL: 27.8 mg/dL (ref 0.0–40.0)

## 2020-06-22 LAB — HEPATIC FUNCTION PANEL
ALT: 13 U/L (ref 0–35)
AST: 11 U/L (ref 0–37)
Albumin: 3.9 g/dL (ref 3.5–5.2)
Alkaline Phosphatase: 77 U/L (ref 39–117)
Bilirubin, Direct: 0.1 mg/dL (ref 0.0–0.3)
Total Bilirubin: 0.5 mg/dL (ref 0.2–1.2)
Total Protein: 6.3 g/dL (ref 6.0–8.3)

## 2020-06-22 LAB — TSH: TSH: 2.12 u[IU]/mL (ref 0.35–4.50)

## 2020-07-06 ENCOUNTER — Other Ambulatory Visit: Payer: Self-pay

## 2020-07-06 ENCOUNTER — Ambulatory Visit (INDEPENDENT_AMBULATORY_CARE_PROVIDER_SITE_OTHER): Payer: Medicare Other | Admitting: Cardiovascular Disease

## 2020-07-06 ENCOUNTER — Encounter: Payer: Self-pay | Admitting: Cardiovascular Disease

## 2020-07-06 VITALS — BP 134/70 | HR 94 | Ht 62.0 in | Wt 140.2 lb

## 2020-07-06 DIAGNOSIS — I10 Essential (primary) hypertension: Secondary | ICD-10-CM | POA: Diagnosis not present

## 2020-07-06 DIAGNOSIS — I340 Nonrheumatic mitral (valve) insufficiency: Secondary | ICD-10-CM

## 2020-07-06 DIAGNOSIS — I2510 Atherosclerotic heart disease of native coronary artery without angina pectoris: Secondary | ICD-10-CM | POA: Diagnosis not present

## 2020-07-06 DIAGNOSIS — I5032 Chronic diastolic (congestive) heart failure: Secondary | ICD-10-CM | POA: Diagnosis not present

## 2020-07-06 DIAGNOSIS — I4821 Permanent atrial fibrillation: Secondary | ICD-10-CM | POA: Diagnosis not present

## 2020-07-06 NOTE — Patient Instructions (Signed)

## 2020-07-06 NOTE — Progress Notes (Signed)
Cardiology Office Note   Date:  07/06/2020   ID:  Ruth Roth, DOB March 20, 1928, MRN [id number]  PCP:  Einar Pheasant, MD  Cardiologist:   [name], MD   Chief Complaint  Patient presents with  . Follow-up    3 month f/u no complaints today. Meds reviewed verbally with pt.      History of Present Illness: Ruth Roth is a 87 y.o. female who presents for a follow-up visit regarding coronary artery disease, chronic diastolic heart failure, mitral regurgitation and persistent atrial fibrillation .  She has known history of hypertension, chronic kidney disease, peptic ulcer disease, fibrocystic breast disease, inflammatory arthritis, autoimmune hepatitis with cirrhosis and neuropathy.  She had a small non-ST elevation myocardial infarction in March 2016 with acute diastolic heart failure after a GI illness.  Echocardiogram showed normal LV systolic function. Cardiac catheterization showed showed 60% ostial left circumflex hazy stenosis which was possibly the culprit. Mild LAD/RCA disease. She was treated medically.   She had worsening heart failure 2019.  Right and left cardiac catheterization in June 2019 showed  showed mild nonobstructive coronary artery disease.  Ejection fraction was 50 to 55% with severe mitral regurgitation.  Right heart catheterization showed severely elevated filling pressures with moderate to severe pulmonary hypertension and severely reduced cardiac output. I transferred the patient to [facility] where she was effectively diuresed with milrinone with subsequent improvement in symptoms.  She had a transesophageal echocardiogram done which showed normal LV systolic function with only mild mitral regurgitation.  The mitral regurgitation was felt to be functional with subsequent improvement after diuresis. Atrial fibrillation is currently being treated with rate control due to symptomatic maintaining sinus rhythm.    Atrial fibrillation has been treated with rate  control as we could not keep her in sinus rhythm even antiarrhythmic medication.  Ventricular rate was difficult to control but improved after increasing metoprolol to 100 mg twice daily. Most recent echocardiogram in December 2021 showed normal LV systolic function, severe pulmonary hypertension and moderate mitral regurgitation. We will increase the dose of torsemide to 40 mg once daily but had some worsening of her kidney function and thus the dose was changed to 40 mg daily alternating with 20 mg daily.  She has been doing reasonably well on current regimen with stable weight.  She reports stable exertional dyspnea.  Palpitations improved with increasing metoprolol.   Past Medical History:  Diagnosis Date  . (HFpEF) heart failure with preserved ejection fraction ([place])    a. 07/2017 Echo: EF 50-55%, no rwma, mild AS/AI, mod MR, mod dil LA, mod TR, PASP 25mmHg.  . Autoimmune hepatitis ([place])    followed by Dr [name]  . Fibrocystic breast disease   . Glaucoma   . Hypercholesterolemia   . Hypertension   . Hypothyroidism   . Inflammatory arthritis   . MI (myocardial infarction) ([place])   . Neuropathy   . Non-obstructive Coronary artery disease    a. 05/2014 NSTEMI/Cath: LM nl, LAD mild dzs, LCX 60ost hazy (? culprit), RCA mild dzs. EF 60% by echo-->Med Rx; b. 08/2017 Cath: LM 30ost, LAD min irregs, LCX small, 40ost/p, RCA large, min irregs, RPDA/RPL1 nl, EF 50-55%. 4+MR.  . Osteoarthritis   . Permanent atrial fibrillation (HCC)    a. CHA2DS2VASc = 6-->Xarelto.  . 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 Severe mitral regurgitation    a. 07/2017 Echo: Mod MR; b. 08/2017 Cath:  4+/Severe MR.    Past Surgical History:  Procedure Laterality Date  . ABDOMINAL HYSTERECTOMY  1963   partial, secondary to fibroids  . APPENDECTOMY    . Billroth    . CARDIAC CATHETERIZATION  05/12/14   [facility]  . CARDIOVERSION N/A 12/26/2016   Procedure:  CARDIOVERSION;  Surgeon: [name], MD;  Location: [facility] ORS;  Service: Cardiovascular;  Laterality: N/A;  . CARDIOVERSION N/A 05/16/2017   Procedure: CARDIOVERSION;  Surgeon: [name], MD;  Location: [facility] ORS;  Service: Cardiovascular;  Laterality: N/A;  . CARDIOVERSION N/A 06/09/2017   Procedure: CARDIOVERSION;  Surgeon: [name], MD;  Location: [facility] ORS;  Service: Cardiovascular;  Laterality: N/A;  . CARDIOVERSION N/A 11/30/2018   Procedure: CARDIOVERSION;  Surgeon: [name], MD;  Location: [facility] ORS;  Service: Cardiovascular;  Laterality: N/A;  . CHOLECYSTECTOMY  1998  . COLONOSCOPY  2009  . RIGHT/LEFT HEART CATH AND CORONARY ANGIOGRAPHY Bilateral 08/21/2017   Procedure: RIGHT/LEFT HEART CATH AND CORONARY ANGIOGRAPHY;  Surgeon: [name], MD;  Location: [place];  Service: Cardiovascular;  Laterality: Bilateral;  . TEE WITHOUT CARDIOVERSION N/A 08/25/2017   Procedure: TRANSESOPHAGEAL ECHOCARDIOGRAM (TEE);  Surgeon: Jolaine Artist, MD;  Location: [facility] ENDOSCOPY;  Service: Cardiovascular;  Laterality: N/A;  . UPPER GI ENDOSCOPY  2004     Current Outpatient Medications  Medication Sig Dispense Refill  . CVS VITAMIN B12 1000 MCG tablet TAKE 1 TABLET BY MOUTH EVERY DAY 60 tablet 1  . dorzolamide (TRUSOPT) 2 % ophthalmic solution Place 1 drop into the left eye 2 (two) times daily.    . fluticasone (FLONASE) 50 MCG/ACT nasal spray Place 2 sprays into both nostrils daily as needed for allergies.     Marland Kitchen gabapentin (NEURONTIN) 300 MG capsule One capsule tid and two capsules q hs. 150 capsule 2  . hydroxychloroquine (PLAQUENIL) 200 MG tablet Take 1 tablet by mouth daily.    Marland Kitchen ipratropium (ATROVENT) 0.06 % nasal spray PLACE 1 SPRAY INTO BOTH NOSTRILS DAILY AS NEEDED FOR RHINITIS. 15 mL 1  . latanoprost (XALATAN) 0.005 % ophthalmic solution Place 1 drop into both eyes at bedtime.     Marland Kitchen levothyroxine (SYNTHROID) 112 MCG tablet Take 112 mcg by mouth  daily before breakfast.    . metoprolol tartrate (LOPRESSOR) 100 MG tablet Take 1 tablet (100 mg total) by mouth 2 (two) times daily. 180 tablet 2  . Multiple Vitamin (MULTI-VITAMIN) tablet Take 1 tablet by mouth at bedtime.     . pantoprazole (PROTONIX) 20 MG tablet TAKE 1 TABLET BY MOUTH EVERY DAY 90 tablet 1  . Rivaroxaban (XARELTO) 15 MG TABS tablet TAKE 1 TABLET (15 MG TOTAL) BY MOUTH DAILY WITH SUPPER. 90 tablet 1  . torsemide (DEMADEX) 20 MG tablet ALTERNATE 2 TABLETS BY MOUTH ONE DAY AND 1 TABLET THE NEXT DAY. 144 tablet 0  . traMADol (ULTRAM) 50 MG tablet Take 1 tablet (50 mg total) by mouth 2 (two) times daily as needed (for pain.). 60 tablet 0  . ursodiol (ACTIGALL) 300 MG capsule Take 300-600 mg by mouth See admin instructions. Take 2 capsules (600 mg) daily in the morning   & 1 capsule (300 mg) at night.     No current facility-administered medications for this visit.    Allergies:   Statins, Haldol [haloperidol lactate], Librax [chlordiazepoxide-clidinium], Plavix [clopidogrel bisulfate], and Ramipril    Social History:  The patient  reports that she has never smoked. She has never used smokeless  tobacco. She reports that she does not drink alcohol and does not use drugs.   Family History:  The patient's family history includes Colon cancer in her daughter; Esophageal cancer in her brother; Ovarian cancer in her daughter; Stroke in her mother.    ROS:  Please see the history of present illness.   Otherwise, review of systems are positive for none.   All other systems are reviewed and negative.    PHYSICAL EXAM: VS:  BP 134/70 (BP Location: Left Arm, Patient Position: Sitting, Cuff Size: Normal)   Pulse 94   Ht 5\' 2"  (1.575 m)   Wt 140 lb 4 oz (63.6 kg)   SpO2 98%   BMI 25.65 kg/m  , BMI Body mass index is 25.65 kg/m. GEN: Well nourished, well developed, in no acute distress  HEENT: normal  Neck: No JVD, carotid bruits, or masses Cardiac: Irregularly irregular and  mildly tachycardic; no rubs, or gallops.  Trace bilateral leg edema. 1/ 6 systolic ejection murmur at the aortic area.  Mild bilateral leg edema Respiratory: Clear to auscultation GI: soft, nontender, nondistended, + BS MS: no deformity or atrophy  Skin: warm and dry, no rash Neuro:  Strength and sensation are intact Psych: euthymic mood, full affect   EKG:  EKG is ordered today. The ekg ordered today demonstrates atrial fibrillation with ventricular rate of 94   Recent Labs: 04/05/2020: B Natriuretic Peptide 754.0 05/08/2020: Hemoglobin 12.9; Platelets 187 06/22/2020: ALT 13; BUN 46; Creatinine, Ser 1.64; Potassium 4.6; Sodium 140; TSH 2.12    Lipid Panel    Component Value Date/Time   CHOL 143 06/22/2020 1018   TRIG 139.0 06/22/2020 1018   HDL 48.40 06/22/2020 1018   CHOLHDL 3 06/22/2020 1018   VLDL 27.8 06/22/2020 1018   LDLCALC 67 06/22/2020 1018   LDLDIRECT 143.5 12/17/2012 0955      Wt Readings from Last 3 Encounters:  07/06/20 140 lb 4 oz (63.6 kg)  06/01/20 138 lb 6.4 oz (62.8 kg)  05/16/20 141 lb (64 kg)       ASSESSMENT AND PLAN:  1.    Permanent atrial fibrillation: Ventricular rate is reasonably controlled on metoprolol 100 mg twice daily.  Continue anticoagulation with Xarelto 15 mg daily given underlying chronic kidney disease.  2.  Chronic diastolic heart failure: She appears to be euvolemic on current dose of torsemide.  3. Essential hypertension: Blood pressure is controlled on current medications.  4.  Functional mitral regurgitation: This was moderate on recent echocardiogram.  5.  Coronary artery disease involving native coronary arteries without angina: Continue medical therapy.   Disposition:   FU with me in 6 months.  Signed,  [name], MD  07/06/2020 7:25 PM    [facility]

## 2020-07-21 DIAGNOSIS — H401131 Primary open-angle glaucoma, bilateral, mild stage: Secondary | ICD-10-CM | POA: Diagnosis not present

## 2020-08-02 DIAGNOSIS — K743 Primary biliary cirrhosis: Secondary | ICD-10-CM | POA: Diagnosis not present

## 2020-08-02 DIAGNOSIS — N281 Cyst of kidney, acquired: Secondary | ICD-10-CM | POA: Diagnosis not present

## 2020-08-02 DIAGNOSIS — K7689 Other specified diseases of liver: Secondary | ICD-10-CM | POA: Diagnosis not present

## 2020-08-02 DIAGNOSIS — K8309 Other cholangitis: Secondary | ICD-10-CM | POA: Diagnosis not present

## 2020-08-15 DIAGNOSIS — M1990 Unspecified osteoarthritis, unspecified site: Secondary | ICD-10-CM | POA: Diagnosis not present

## 2020-08-15 DIAGNOSIS — M810 Age-related osteoporosis without current pathological fracture: Secondary | ICD-10-CM | POA: Diagnosis not present

## 2020-08-15 DIAGNOSIS — L509 Urticaria, unspecified: Secondary | ICD-10-CM | POA: Diagnosis not present

## 2020-08-15 DIAGNOSIS — K7460 Unspecified cirrhosis of liver: Secondary | ICD-10-CM | POA: Diagnosis not present

## 2020-08-15 DIAGNOSIS — N1832 Chronic kidney disease, stage 3b: Secondary | ICD-10-CM | POA: Diagnosis not present

## 2020-08-15 DIAGNOSIS — E790 Hyperuricemia without signs of inflammatory arthritis and tophaceous disease: Secondary | ICD-10-CM | POA: Diagnosis not present

## 2020-08-22 DIAGNOSIS — E039 Hypothyroidism, unspecified: Secondary | ICD-10-CM | POA: Diagnosis not present

## 2020-09-01 ENCOUNTER — Ambulatory Visit (HOSPITAL_COMMUNITY)
Admission: RE | Admit: 2020-09-01 | Discharge: 2020-09-01 | Disposition: A | Discharge status: Home / Self Care | Payer: Medicare Other | Source: Ambulatory Visit | Attending: Internal Medicine | Admitting: Internal Medicine

## 2020-09-01 ENCOUNTER — Other Ambulatory Visit: Payer: Self-pay

## 2020-09-01 VITALS — BP 120/66 | HR 97 | Ht 62.0 in | Wt 141.2 lb

## 2020-09-01 DIAGNOSIS — Z888 Allergy status to other drugs, medicaments and biological substances status: Secondary | ICD-10-CM | POA: Diagnosis not present

## 2020-09-01 DIAGNOSIS — N184 Chronic kidney disease, stage 4 (severe): Secondary | ICD-10-CM | POA: Diagnosis not present

## 2020-09-01 DIAGNOSIS — Z7901 Long term (current) use of anticoagulants: Secondary | ICD-10-CM | POA: Insufficient documentation

## 2020-09-01 DIAGNOSIS — I130 Hypertensive heart and chronic kidney disease with heart failure and stage 1 through stage 4 chronic kidney disease, or unspecified chronic kidney disease: Secondary | ICD-10-CM | POA: Insufficient documentation

## 2020-09-01 DIAGNOSIS — I5032 Chronic diastolic (congestive) heart failure: Secondary | ICD-10-CM | POA: Insufficient documentation

## 2020-09-01 DIAGNOSIS — I252 Old myocardial infarction: Secondary | ICD-10-CM | POA: Insufficient documentation

## 2020-09-01 DIAGNOSIS — I340 Nonrheumatic mitral (valve) insufficiency: Secondary | ICD-10-CM

## 2020-09-01 DIAGNOSIS — E7800 Pure hypercholesterolemia, unspecified: Secondary | ICD-10-CM | POA: Diagnosis not present

## 2020-09-01 DIAGNOSIS — I2510 Atherosclerotic heart disease of native coronary artery without angina pectoris: Secondary | ICD-10-CM | POA: Diagnosis not present

## 2020-09-01 DIAGNOSIS — I4821 Permanent atrial fibrillation: Secondary | ICD-10-CM | POA: Insufficient documentation

## 2020-09-01 DIAGNOSIS — M109 Gout, unspecified: Secondary | ICD-10-CM | POA: Diagnosis not present

## 2020-09-01 DIAGNOSIS — Z79899 Other long term (current) drug therapy: Secondary | ICD-10-CM | POA: Diagnosis not present

## 2020-09-01 DIAGNOSIS — M1000 Idiopathic gout, unspecified site: Secondary | ICD-10-CM | POA: Diagnosis not present

## 2020-09-01 LAB — BASIC METABOLIC PANEL WITH GFR
Anion gap: 8 (ref 5–15)
BUN: 52 mg/dL — ABNORMAL HIGH (ref 8–23)
CO2: 27 mmol/L (ref 22–32)
Calcium: 9 mg/dL (ref 8.9–10.3)
Chloride: 102 mmol/L (ref 98–111)
Creatinine, Ser: 1.89 mg/dL — ABNORMAL HIGH (ref 0.44–1.00)
GFR, Estimated: 25 mL/min — ABNORMAL LOW (ref >60)
Glucose, Bld: 70 mg/dL (ref 70–99)
Potassium: 5.2 mmol/L — ABNORMAL HIGH (ref 3.5–5.1)
Sodium: 137 mmol/L (ref 135–145)

## 2020-09-01 LAB — CBC
HCT: 39.4 % (ref 36.0–46.0)
Hemoglobin: 12.4 g/dL (ref 12.0–15.0)
MCH: 32.5 pg (ref 26.0–34.0)
MCHC: 31.5 g/dL (ref 30.0–36.0)
MCV: 103.4 fL — ABNORMAL HIGH (ref 80.0–100.0)
Platelets: 175 10*3/uL (ref 150–400)
RBC: 3.81 MIL/uL — ABNORMAL LOW (ref 3.87–5.11)
RDW: 14.6 % (ref 11.5–15.5)
WBC: 5.9 10*3/uL (ref 4.0–10.5)
nRBC: 0 % (ref 0.0–0.2)

## 2020-09-01 LAB — URIC ACID: Uric Acid, Serum: 8.8 mg/dL — ABNORMAL HIGH (ref 2.5–7.1)

## 2020-09-01 MED ORDER — COLCHICINE 0.6 MG PO TABS: 0.6000 mg | ORAL_TABLET | Freq: Every day | ORAL | 2 refills | Status: DC | PRN

## 2020-09-01 NOTE — Progress Notes (Signed)
Advanced Heart Failure Clinic Note   Date:  09/01/2020   ID:  Ruth Roth, DOB 09/28/1928, MRN [id number]  Location: Home  Provider location: [facility] Type of Visit: Established patient  PCP:  Einar Pheasant, MD  Cardiologist:  [name], MD Primary HF: [place] Nephrology: Dr. Holley Raring  Chief Complaint: Heart Failure follow-up   History of Present Illness:  Ruth Roth is a 87 y.o. female with a history of CAD, chronic diastolic HF with severe mitral reguritationm, chronic atrial fibrillation, hypertension, peptic ulcer disease.   Admitted [facility] 08/21/17 s/p R/LHC which showed significantly elevated filling pressures, severe MR, and low cardiac output, and then transferred to [facility] 6/14 for consideration of MitraClip.  AKI noted on admission felt to be due to cardiorenal syndrome. She required milrinone and lasix for diuresis. Diuresed 25 lbs and transitioned to 20 mg torsemide daily. Case and images reviewed at length with structural heart team. MR felt to be truly functional with improvement from hemodynamic optimization. Not felt to be MitraClip candidate. Discharge weight: 121 lbs.    In 2020 had recurrent AF with RVR Amio 200 bid started. Underwent DC-CV on 9/210/20. PCP cut back torsemide to 20 qOD due to AKI but developed volume overload. Torsemide increased back to 20 daily. Developed recurrent AF and Amio stopped. Saw Dr. Fletcher Anon in 04/30/19 and metoprolol increased to 75 bid for better rate control. Says she felt better on the 50 but is tolerating this ok.  Here for routine f/u. Says she is doing pretty well. Breathing ok. Able to do all ADLs. No CP. This past week developed pain in R ankle which spread to R great toe. Wonders if it is gout.   Echo 12/21 EF 55-60% Moderate RV HK. Personally reviewed   Cardiac Studies: Echo 10/09/17: EF 55-60%, no AI, mild to mod MR, LA moderately dilated, RA mildly dilated, RV normal, PA peak pressure 52  mmHg   [facility] 08/21/2017 - Mild non-obstructive CAD RHC Procedural Findings: (Obtained from Scan 08/21/17 at 1030) Hemodynamics (mmHg) RA mean 17 RV 68/11 (19) PA 62/27 (44) PCWP 41  AO 147/67 (102) Cardiac Output (Fick) 2.45 Cardiac Index  (Fick) 1.46 PVR 1.23 WU   Echo 08/01/17 LVEF 50-55%, Mild AS, Mod MR, Mod LAE, Mild RAE, Mod TR, PA peak pressure 65 mm Hg. MR ++ calcified, and RV mild/moderately reduced function.       Past Medical History:  Diagnosis Date   (HFpEF) heart failure with preserved ejection fraction ([place])    a. 07/2017 Echo: EF 50-55%, no rwma, mild AS/AI, mod MR, mod dil LA, mod TR, PASP 51mmHg.   Autoimmune hepatitis ([place])    followed by Dr [name]   Fibrocystic breast disease    Glaucoma    Hypercholesterolemia    Hypertension    Hypothyroidism    Inflammatory arthritis    MI (myocardial infarction) ([place])    Neuropathy    Non-obstructive Coronary artery disease    a. 05/2014 NSTEMI/Cath: LM nl, LAD mild dzs, LCX 60ost hazy (? culprit), RCA mild dzs. EF 60% by echo-->Med Rx; b. 08/2017 Cath: LM 30ost, LAD min irregs, LCX small, 40ost/p, RCA large, min irregs, RPDA/RPL1 nl, EF 50-55%. 4+MR.   Osteoarthritis    Permanent atrial fibrillation (HCC)    a. CHA2DS2VASc = 6-->Xarelto.   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 07/2017  Echo: Mod MR; b. 08/2017 Cath: 4+/Severe MR.   Past Surgical History:  Procedure Laterality Date   ABDOMINAL HYSTERECTOMY  1963   partial, secondary to fibroids   APPENDECTOMY     Billroth     CARDIAC CATHETERIZATION  05/12/14   [place]   CARDIOVERSION N/A 12/26/2016   Procedure: CARDIOVERSION;  Surgeon: [name], MD;  Location: [facility] ORS;  Service: Cardiovascular;  Laterality: N/A;   CARDIOVERSION N/A 05/16/2017   Procedure: CARDIOVERSION;  Surgeon: [name], MD;  Location: [facility] ORS;  Service: Cardiovascular;  Laterality: N/A;    CARDIOVERSION N/A 06/09/2017   Procedure: CARDIOVERSION;  Surgeon: [name], MD;  Location: [facility] ORS;  Service: Cardiovascular;  Laterality: N/A;   CARDIOVERSION N/A 11/30/2018   Procedure: CARDIOVERSION;  Surgeon: [name], MD;  Location: [facility] ORS;  Service: Cardiovascular;  Laterality: N/A;   CHOLECYSTECTOMY  1998   COLONOSCOPY  2009   RIGHT/LEFT HEART CATH AND CORONARY ANGIOGRAPHY Bilateral 08/21/2017   Procedure: RIGHT/LEFT HEART CATH AND CORONARY ANGIOGRAPHY;  Surgeon: [name], MD;  Location: [place] CV LAB;  Service: Cardiovascular;  Laterality: Bilateral;   TEE WITHOUT CARDIOVERSION N/A 08/25/2017   Procedure: TRANSESOPHAGEAL ECHOCARDIOGRAM (TEE);  Surgeon: Jolaine Artist, MD;  Location: [facility] ENDOSCOPY;  Service: Cardiovascular;  Laterality: N/A;   UPPER GI ENDOSCOPY  2004     Current Outpatient Medications  Medication Sig Dispense Refill   CVS VITAMIN B12 1000 MCG tablet TAKE 1 TABLET BY MOUTH EVERY DAY 60 tablet 1   dorzolamide (TRUSOPT) 2 % ophthalmic solution Place 1 drop into the left eye 2 (two) times daily.     fluticasone (FLONASE) 50 MCG/ACT nasal spray Place 2 sprays into both nostrils daily as needed for allergies.      gabapentin (NEURONTIN) 300 MG capsule One capsule tid and two capsules q hs. 150 capsule 2   hydroxychloroquine (PLAQUENIL) 200 MG tablet Take 1 tablet by mouth daily.     ipratropium (ATROVENT) 0.03 % nasal spray SMARTSIG:2 Spray(s) Both Nares 3 Times Daily PRN     latanoprost (XALATAN) 0.005 % ophthalmic solution Place 1 drop into both eyes at bedtime.      levothyroxine (SYNTHROID) 112 MCG tablet Take 112 mcg by mouth daily before breakfast.     metoprolol tartrate (LOPRESSOR) 100 MG tablet Take 1 tablet (100 mg total) by mouth 2 (two) times daily. 180 tablet 2   Multiple Vitamin (MULTI-VITAMIN) tablet Take 1 tablet by mouth at bedtime.      pantoprazole (PROTONIX) 20 MG tablet TAKE 1 TABLET BY MOUTH EVERY DAY 90 tablet 1    Rivaroxaban (XARELTO) 15 MG TABS tablet TAKE 1 TABLET (15 MG TOTAL) BY MOUTH DAILY WITH SUPPER. 90 tablet 1   torsemide (DEMADEX) 20 MG tablet ALTERNATE 2 TABLETS BY MOUTH ONE DAY AND 1 TABLET THE NEXT DAY. 144 tablet 0   traMADol (ULTRAM) 50 MG tablet Take 1 tablet (50 mg total) by mouth 2 (two) times daily as needed (for pain.). 60 tablet 0   ursodiol (ACTIGALL) 300 MG capsule Take 300-600 mg by mouth See admin instructions. Take 2 capsules (600 mg) daily in the morning   & 1 capsule (300 mg) at night.     No current facility-administered medications for this encounter.    Allergies:   Statins, Haldol [haloperidol lactate], Librax [chlordiazepoxide-clidinium], Plavix [clopidogrel bisulfate], and Ramipril   Social History:  The patient  reports that she has never smoked. She has never used smokeless  tobacco. She reports that she does not drink alcohol and does not use drugs.   Family History:  The patient's family history includes Colon cancer in her daughter; Esophageal cancer in her brother; Ovarian cancer in her daughter; Stroke in her mother.   ROS:  Please see the history of present illness.   All other systems are personally reviewed and negative.   Vitals:   09/01/20 1244  BP: 120/66  Pulse: 97  SpO2: 97%  Weight: 64 kg (141 lb 3.2 oz)  Height: 5\' 2"  (1.575 m)    Exam:   General:  Elderly. No resp difficulty HEENT: normal Neck: supple. no JVD. Carotids 2+ bilat; no bruits. No lymphadenopathy or thryomegaly appreciated. Cor: PMI nondisplaced. Irregular rate & rhythm. 2/6 MR. Lungs: clear Abdomen: soft, nontender, nondistended. No hepatosplenomegaly. No bruits or masses. Good bowel sounds. Extremities: no cyanosis, clubbing, rash, edema left ankle and 1st toe sore Neuro: alert & orientedx3, cranial nerves grossly intact. moves all 4 extremities w/o difficulty. Affect pleasant   Recent Labs: 04/05/2020: B Natriuretic Peptide 754.0 05/08/2020: Hemoglobin 12.9; Platelets  187 06/22/2020: ALT 13; BUN 46; Creatinine, Ser 1.64; Potassium 4.6; Sodium 140; TSH 2.12  Personally reviewed   Wt Readings from Last 3 Encounters:  09/01/20 64 kg (141 lb 3.2 oz)  07/06/20 63.6 kg (140 lb 4 oz)  06/01/20 62.8 kg (138 lb 6.4 oz)     ASSESSMENT AND PLAN:  1.  Chronic diastolic congestive heart failure with h/o low output:  - Echo 08/01/17 LVEF 50-55%, Mild AS, Mod MR, Mod LAE, Mild RAE, Mod TR, RV moderately HK PA peak pressure 65 mm Hg. - Cath 08/21/17 with signficantly elevated filling pressures, severe MR, and low cardiac output. Had cardiorenal syndrome on admit with severe MR and RV failure and required milrinone - Echo 10/09/17: EF 55-60%, mild to mod MR - Echo 12/20 EF 60-65% mod MR  - Echo 12/21 EF 55-60% Moderate RV HK.  Moderate MR  - Stable NYHA II. Volume status looks good. Continue current regimen of torsemide 40 daily alternating with 20 daily. - Labs today  2.  Severe mitral regurgitation:  - Echo 08/01/17 LVEF 50-55%, Mild AS, Mod MR, Mod LAE, Mild RAE, Mod TR, PA peak pressure 65 mm Hg. MR ++ calcified, and RV mild/moderately reduced function.  - TEE 6/17 showed only mild MR with restricted posterior leaflet.  -Echo 10/09/17: EF 55-60%, mild to mod MR  -  Echo 12/20 EF 60-65% mod MR  - Likely functional MR and MR improved with hemodynamic optimization.  Doing well now. Will follow closely if fails close medical therapy consider MitraClip.  - Remains stable   3.  Chronic atrial fibrillation: - Has previously failed amiodarone and DCCV x3.  - Doubt we will be able to maintain NSR. Seems to be tolerating AF ok. If cannot maintain rate control will need AVN ablation and CRT  - Continue metoprolol 100 bid.  - Continue Xarelto. No bleeding. CBC today - Seen by Dr. Rayann Heman 12/01/17. He did not recommend either afib ablation or AV node ablation + PPM at that time - Good rate control. Zio 9/21 Chronic AF. Mean HR 90.   4.  Essential hypertension:  - Blood pressure  well controlled. Continue current regimen.  5. CKD IV with h/o AKI - Creatinine stable 1.8-1.9 - recheck labs today - Follows with Dr. Holley Raring  6. Acute gout in left ankle and 1st toe - check uric acid - colchicine 0.6 daily prn  Ruth Bickers, MD  11:43 PM   [facility] [place] [address] [facility] [place] [address] [phone] (office) [phone] (fax)

## 2020-09-01 NOTE — Patient Instructions (Signed)
Take Colchicine 0.6 mg Daily FOR 2-3 DAYS as needed for gout pain  Labs done today, we will call you with abnormal results  Please call our office in November to schedule your follow up appointment and echocardiogram  If you have any questions or concerns before your next appointment please send [place] a message through [facility] or call our office at [phone].    TO LEAVE A MESSAGE FOR THE NURSE SELECT OPTION 2, PLEASE LEAVE A MESSAGE INCLUDING: YOUR NAME DATE OF BIRTH CALL BACK NUMBER REASON FOR CALL**this is important as we prioritize the call backs  YOU WILL RECEIVE A CALL BACK THE SAME DAY AS LONG AS YOU CALL BEFORE 4:00 PM  At the [facility], you and your health needs are our priority. As part of our continuing mission to provide you with exceptional heart care, we have created designated Provider Care Teams. These Care Teams include your primary Cardiologist (physician) and Advanced Practice Providers (APPs- Physician Assistants and Nurse Practitioners) who all work together to provide you with the care you need, when you need it.   You may see any of the following providers on your designated Care Team at your next follow up: Dr Glori Bickers Dr Loralie Champagne Dr Patrice Paradise, NP Lyda Jester, [place] Ginnie Smart Audry Riles, PharmD   Please be sure to bring in all your medications bottles to every appointment.

## 2020-09-04 ENCOUNTER — Telehealth (HOSPITAL_COMMUNITY): Payer: Self-pay | Admitting: *Deleted

## 2020-09-04 DIAGNOSIS — I5032 Chronic diastolic (congestive) heart failure: Secondary | ICD-10-CM

## 2020-09-04 NOTE — Telephone Encounter (Signed)
-----   Message from Jolaine Artist, MD sent at 09/03/2020  3:11 PM EDT ----- Repeat BMET in 2 weeks please

## 2020-09-04 NOTE — Telephone Encounter (Signed)
Pt aware, request labs be done at PCP office, order placed

## 2020-09-07 ENCOUNTER — Other Ambulatory Visit: Payer: Self-pay

## 2020-09-07 ENCOUNTER — Encounter: Payer: Self-pay | Admitting: Internal Medicine

## 2020-09-07 ENCOUNTER — Ambulatory Visit (INDEPENDENT_AMBULATORY_CARE_PROVIDER_SITE_OTHER): Payer: Medicare Other | Admitting: Internal Medicine

## 2020-09-07 VITALS — BP 120/76 | HR 77 | Temp 97.7°F | Ht 62.01 in | Wt 140.2 lb

## 2020-09-07 DIAGNOSIS — I5032 Chronic diastolic (congestive) heart failure: Secondary | ICD-10-CM

## 2020-09-07 DIAGNOSIS — E039 Hypothyroidism, unspecified: Secondary | ICD-10-CM | POA: Diagnosis not present

## 2020-09-07 DIAGNOSIS — N183 Chronic kidney disease, stage 3 unspecified: Secondary | ICD-10-CM | POA: Diagnosis not present

## 2020-09-07 DIAGNOSIS — I340 Nonrheumatic mitral (valve) insufficiency: Secondary | ICD-10-CM

## 2020-09-07 DIAGNOSIS — I2510 Atherosclerotic heart disease of native coronary artery without angina pectoris: Secondary | ICD-10-CM

## 2020-09-07 DIAGNOSIS — D500 Iron deficiency anemia secondary to blood loss (chronic): Secondary | ICD-10-CM

## 2020-09-07 DIAGNOSIS — K754 Autoimmune hepatitis: Secondary | ICD-10-CM | POA: Diagnosis not present

## 2020-09-07 DIAGNOSIS — I272 Pulmonary hypertension, unspecified: Secondary | ICD-10-CM | POA: Diagnosis not present

## 2020-09-07 DIAGNOSIS — I4819 Other persistent atrial fibrillation: Secondary | ICD-10-CM | POA: Diagnosis not present

## 2020-09-07 DIAGNOSIS — K219 Gastro-esophageal reflux disease without esophagitis: Secondary | ICD-10-CM | POA: Diagnosis not present

## 2020-09-07 DIAGNOSIS — D329 Benign neoplasm of meninges, unspecified: Secondary | ICD-10-CM

## 2020-09-07 DIAGNOSIS — M810 Age-related osteoporosis without current pathological fracture: Secondary | ICD-10-CM

## 2020-09-07 DIAGNOSIS — I10 Essential (primary) hypertension: Secondary | ICD-10-CM

## 2020-09-07 DIAGNOSIS — K743 Primary biliary cirrhosis: Secondary | ICD-10-CM

## 2020-09-07 DIAGNOSIS — E7800 Pure hypercholesterolemia, unspecified: Secondary | ICD-10-CM

## 2020-09-07 MED ORDER — TRAMADOL HCL 50 MG PO TABS: 50.0000 mg | ORAL_TABLET | Freq: Two times a day (BID) | ORAL | 0 refills | Status: DC | PRN

## 2020-09-07 NOTE — Progress Notes (Signed)
[name], female   DOB: 11-27-1928, 87 y.o.   MRN: [id number]   Subjective:    [name], female    DOB: Aug 04, 1928, 87 y.o.   MRN: [id number]  HPI This visit occurred during the SARS-CoV-2 public health emergency.  Safety protocols were in place, including screening questions prior to the visit, additional usage of staff PPE, and extensive cleaning of exam room while observing appropriate contact time as indicated for disinfecting solutions.   Patient here for a scheduled follow up.  Here to follow up regarding her blood pressure and cholesterol.  Just saw cardiology 09/01/20.  Breathing ok.  No chest pain. Reported increased pain right ankle that extended into her right great toe.  Prescribed colchicine.  Recommended continuing torsemide 40mg  alternating with 20mg  qod.  Continues on metoprolol and xarelto.  Eating.  No nausea or vomiting.  Bowels moving.  Foot is better.  Discussed preventative medication.  She is concerned regarding rash (localized) - concern rash from plaquenil.  Does not appear to be c/w drug rash.  No fever.  No headache.    Past Medical History:  Diagnosis Date   (HFpEF) heart failure with preserved ejection fraction ([place])    a. 07/2017 Echo: EF 50-55%, no rwma, mild AS/AI, mod MR, mod dil LA, mod TR, PASP 28mmHg.   Autoimmune hepatitis ([place])    followed by Dr [name]   Fibrocystic breast disease    Glaucoma    Hypercholesterolemia    Hypertension    Hypothyroidism    Inflammatory arthritis    MI (myocardial infarction) ([place])    Neuropathy    Non-obstructive Coronary artery disease    a. 05/2014 NSTEMI/Cath: LM nl, LAD mild dzs, LCX 60ost hazy (? culprit), RCA mild dzs. EF 60% by echo-->Med Rx; b. 08/2017 Cath: LM 30ost, LAD min irregs, LCX small, 40ost/p, RCA large, min irregs, RPDA/RPL1 nl, EF 50-55%. 4+MR.   Osteoarthritis    Permanent atrial fibrillation (HCC)    a. CHA2DS2VASc = 6-->Xarelto.   PUD (peptic ulcer disease)    requiring  Billroth II surgery with resulting dumping syndrome   Severe mitral regurgitation    a. 07/2017 Echo: Mod MR; b. 08/2017 Cath: 4+/Severe MR.   Past Surgical History:  Procedure Laterality Date   ABDOMINAL HYSTERECTOMY  1963   partial, secondary to fibroids   APPENDECTOMY     Billroth     CARDIAC CATHETERIZATION  05/12/14   [place]   CARDIOVERSION N/A 12/26/2016   Procedure: CARDIOVERSION;  Surgeon: [name], MD;  Location: [facility] ORS;  Service: Cardiovascular;  Laterality: N/A;   CARDIOVERSION N/A 05/16/2017   Procedure: CARDIOVERSION;  Surgeon: [name], MD;  Location: [facility] ORS;  Service: Cardiovascular;  Laterality: N/A;   CARDIOVERSION N/A 06/09/2017   Procedure: CARDIOVERSION;  Surgeon: [name], MD;  Location: [facility] ORS;  Service: Cardiovascular;  Laterality: N/A;   CARDIOVERSION N/A 11/30/2018   Procedure: CARDIOVERSION;  Surgeon: [name], MD;  Location: [facility] ORS;  Service: Cardiovascular;  Laterality: N/A;   CHOLECYSTECTOMY  1998   COLONOSCOPY  2009   RIGHT/LEFT HEART CATH AND CORONARY ANGIOGRAPHY Bilateral 08/21/2017   Procedure: RIGHT/LEFT HEART CATH AND CORONARY ANGIOGRAPHY;  Surgeon: [name], MD;  Location: [place] [facility];  Service: Cardiovascular;  Laterality: Bilateral;   TEE WITHOUT CARDIOVERSION N/A 08/25/2017   Procedure: TRANSESOPHAGEAL ECHOCARDIOGRAM (TEE);  Surgeon: Jolaine Artist, MD;  Location: [place];  Service: Cardiovascular;  Laterality: N/A;  UPPER GI ENDOSCOPY  2004   Family History  Problem Relation Age of Onset   Stroke Mother    Esophageal cancer Brother        also had lung cancer   Ovarian cancer Daughter    Colon cancer Daughter    Breast cancer Neg Hx    Social History   Socioeconomic History   Marital status: Widowed    Spouse name: Not on file   Number of children: 2   Years of education: Not on file   Highest education level: Not on file  Occupational History   Occupation: retired   Tobacco Use   Smoking status: Never   Smokeless tobacco: Never  Vaping Use   Vaping Use: Never used  Substance and Sexual Activity   Alcohol use: No    Alcohol/week: 0.0 standard drinks   Drug use: No   Sexual activity: Never  Other Topics Concern   Not on file  Social History Narrative   Not on file   Social Determinants of Health   Financial Resource Strain: Low Risk    Difficulty of Paying Living Expenses: Not hard at all  Food Insecurity: No Food Insecurity   Worried About Charity fundraiser in the Last Year: Never true   [place] in the Last Year: Never true  Transportation Needs: No Transportation Needs   Lack of Transportation (Medical): No   Lack of Transportation (Non-Medical): No  Physical Activity: Insufficiently Active   Days of Exercise per Week: 3 days   Minutes of Exercise per Session: 40 min  Stress: No Stress Concern Present   Feeling of Stress : Not at all  Social Connections: Unknown   Frequency of Communication with Friends and Family: More than three times a week   Frequency of Social Gatherings with Friends and Family: More than three times a week   Attends Religious Services: Not on Electrical engineer or Organizations: Not on file   Attends Archivist Meetings: Not on file   Marital Status: Not on file    Review of Systems  Constitutional:  Negative for appetite change and fever.  HENT:  Negative for congestion and sinus pressure.   Respiratory:  Negative for cough and chest tightness.        Breathing stable.    Cardiovascular:  Negative for chest pain, palpitations and leg swelling.  Gastrointestinal:  Negative for abdominal pain, diarrhea, nausea and vomiting.  Genitourinary:  Negative for difficulty urinating and dysuria.  Musculoskeletal:  Negative for myalgias.       Foot pain as outlined.    Skin:  Negative for color change and wound.  Neurological:  Negative for dizziness, light-headedness and headaches.   Psychiatric/Behavioral:  Negative for agitation and dysphoric mood.       Objective:    Physical Exam Vitals reviewed.  Constitutional:      General: She is not in acute distress.    Appearance: Normal appearance.  HENT:     Head: Normocephalic and atraumatic.     Right Ear: External ear normal.     Left Ear: External ear normal.  Eyes:     General: No scleral icterus.       Right eye: No discharge.        Left eye: No discharge.     Conjunctiva/sclera: Conjunctivae normal.  Neck:     Thyroid: No thyromegaly.  Cardiovascular:     Rate and Rhythm: Normal  rate and regular rhythm.  Pulmonary:     Effort: No respiratory distress.     Breath sounds: Normal breath sounds. No wheezing.  Abdominal:     General: Bowel sounds are normal.     Palpations: Abdomen is soft.     Tenderness: There is no abdominal tenderness.  Musculoskeletal:        General: No swelling or tenderness.     Cervical back: Neck supple. No tenderness.  Lymphadenopathy:     Cervical: No cervical adenopathy.  Skin:    Findings: No erythema or rash.  Neurological:     Mental Status: She is alert.  Psychiatric:        Mood and Affect: Mood normal.        Behavior: Behavior normal.    BP 120/76   Pulse 77   Temp 97.7 F (36.5 C)   Ht 5' 2.01" (1.575 m)   Wt 140 lb 3.2 oz (63.6 kg)   SpO2 99%   BMI 25.64 kg/m  Wt Readings from Last 3 Encounters:  09/07/20 140 lb 3.2 oz (63.6 kg)  09/01/20 141 lb 3.2 oz (64 kg)  07/06/20 140 lb 4 oz (63.6 kg)    Outpatient Encounter Medications as of 09/07/2020  Medication Sig   colchicine 0.6 MG tablet Take 1 tablet (0.6 mg total) by mouth daily as needed.   CVS VITAMIN B12 1000 MCG tablet TAKE 1 TABLET BY MOUTH EVERY DAY   dorzolamide (TRUSOPT) 2 % ophthalmic solution Place 1 drop into the left eye 2 (two) times daily.   fluticasone (FLONASE) 50 MCG/ACT nasal spray Place 2 sprays into both nostrils daily as needed for allergies.    gabapentin (NEURONTIN) 300  MG capsule One capsule tid and two capsules q hs.   hydroxychloroquine (PLAQUENIL) 200 MG tablet Take 1 tablet by mouth daily.   ipratropium (ATROVENT) 0.03 % nasal spray SMARTSIG:2 Spray(s) Both Nares 3 Times Daily PRN   latanoprost (XALATAN) 0.005 % ophthalmic solution Place 1 drop into both eyes at bedtime.    levothyroxine (SYNTHROID) 112 MCG tablet Take 112 mcg by mouth daily before breakfast.   Multiple Vitamin (MULTI-VITAMIN) tablet Take 1 tablet by mouth at bedtime.    pantoprazole (PROTONIX) 20 MG tablet TAKE 1 TABLET BY MOUTH EVERY DAY   Rivaroxaban (XARELTO) 15 MG TABS tablet TAKE 1 TABLET (15 MG TOTAL) BY MOUTH DAILY WITH SUPPER.   torsemide (DEMADEX) 20 MG tablet ALTERNATE 2 TABLETS BY MOUTH ONE DAY AND 1 TABLET THE NEXT DAY.   ursodiol (ACTIGALL) 300 MG capsule Take 300-600 mg by mouth See admin instructions. Take 2 capsules (600 mg) daily in the morning   & 1 capsule (300 mg) at night.   [DISCONTINUED] traMADol (ULTRAM) 50 MG tablet Take 1 tablet (50 mg total) by mouth 2 (two) times daily as needed (for pain.).   metoprolol tartrate (LOPRESSOR) 100 MG tablet Take 1 tablet (100 mg total) by mouth 2 (two) times daily.   traMADol (ULTRAM) 50 MG tablet Take 1 tablet (50 mg total) by mouth 2 (two) times daily as needed (for pain.).   No facility-administered encounter medications on file as of 09/07/2020.     Lab Results  Component Value Date   WBC 5.9 09/01/2020   HGB 12.4 09/01/2020   HCT 39.4 09/01/2020   PLT 175 09/01/2020   GLUCOSE 70 09/01/2020   CHOL 143 06/22/2020   TRIG 139.0 06/22/2020   HDL 48.40 06/22/2020   LDLDIRECT 143.5 12/17/2012  LDLCALC 67 06/22/2020   ALT 13 06/22/2020   AST 11 06/22/2020   NA 137 09/01/2020   K 5.2 (H) 09/01/2020   CL 102 09/01/2020   CREATININE 1.89 (H) 09/01/2020   BUN 52 (H) 09/01/2020   CO2 27 09/01/2020   TSH 2.12 06/22/2020   INR 1.63 12/29/2017   HGBA1C 5.6 01/27/2018       Assessment & Plan:   Problem List Items  Addressed This Visit     Autoimmune hepatitis ([place])    Follow liver function tests.  Followed by GI.        Relevant Orders   Hepatic function panel   Benign hypertension    Continue losartan and metoprolol.  Pressures controlled.  Follow pressure.  Follow metabolic panel.        Benign meningioma ([place])    Meningioma noted on previous MRI.  Saw Dr Jorene Minors (oncology [facility]).  Recommended f/u in 4 years.  Overdue.  Discuss with Ms Villescas regarding f/u.  Of note, no mention on MRI 2018.        Chronic diastolic heart failure (HCC)    Continue torsemide and metoprolol.  Continue xarelto.  No evidence of volume overload on exam.  Breathing stable.        CKD (chronic kidney disease) stage 3, GFR 30-59 ml/min (HCC)    Avoid antiinflammatories.  Follow metabolic panel.        GERD (gastroesophageal reflux disease)    Upper symptoms controlled.  On protonix.        Hypercholesteremia    Unable to take statin medication.  Follow lipid panel.        Hypothyroidism    On thyroid replacement.  Follow tsh.        Relevant Orders   TSH   Iron deficiency anemia    Has seen hematology.  Follow cbc and iron studies.        Osteoporosis - Primary    Continue prolia.        Persistent atrial fibrillation (HCC)    Continue metoprolol and xarelto.  Stable.  Rate controlled.        Primary biliary cholangitis (HCC)    Followed by GI. Stable. Follow liver function tests.         Pulmonary hypertension, unspecified ([place])    Followed by cardiology.        Severe mitral regurgitation    No evidence of volume overload.  Improved with current medication regimen.  Continue torsemide.           Einar Pheasant, MD

## 2020-09-08 ENCOUNTER — Inpatient Hospital Stay: Payer: Medicare Other | Attending: Oncology

## 2020-09-08 DIAGNOSIS — Z7901 Long term (current) use of anticoagulants: Secondary | ICD-10-CM | POA: Insufficient documentation

## 2020-09-08 DIAGNOSIS — D7589 Other specified diseases of blood and blood-forming organs: Secondary | ICD-10-CM | POA: Insufficient documentation

## 2020-09-08 DIAGNOSIS — D500 Iron deficiency anemia secondary to blood loss (chronic): Secondary | ICD-10-CM | POA: Insufficient documentation

## 2020-09-08 DIAGNOSIS — Z79899 Other long term (current) drug therapy: Secondary | ICD-10-CM | POA: Insufficient documentation

## 2020-09-08 DIAGNOSIS — I4891 Unspecified atrial fibrillation: Secondary | ICD-10-CM | POA: Insufficient documentation

## 2020-09-12 ENCOUNTER — Encounter: Payer: Self-pay | Admitting: Oncology

## 2020-09-12 ENCOUNTER — Other Ambulatory Visit: Payer: Self-pay

## 2020-09-12 ENCOUNTER — Inpatient Hospital Stay (HOSPITAL_BASED_OUTPATIENT_CLINIC_OR_DEPARTMENT_OTHER): Payer: Medicare Other | Admitting: Oncology

## 2020-09-12 ENCOUNTER — Encounter: Payer: Self-pay | Admitting: Internal Medicine

## 2020-09-12 ENCOUNTER — Inpatient Hospital Stay: Payer: Medicare Other

## 2020-09-12 VITALS — BP 130/71 | HR 75 | Temp 96.6°F | Resp 17 | Wt 136.0 lb

## 2020-09-12 DIAGNOSIS — D500 Iron deficiency anemia secondary to blood loss (chronic): Secondary | ICD-10-CM

## 2020-09-12 DIAGNOSIS — Z79899 Other long term (current) drug therapy: Secondary | ICD-10-CM | POA: Diagnosis not present

## 2020-09-12 DIAGNOSIS — Z7901 Long term (current) use of anticoagulants: Secondary | ICD-10-CM | POA: Diagnosis not present

## 2020-09-12 DIAGNOSIS — D7589 Other specified diseases of blood and blood-forming organs: Secondary | ICD-10-CM

## 2020-09-12 DIAGNOSIS — I4891 Unspecified atrial fibrillation: Secondary | ICD-10-CM | POA: Diagnosis not present

## 2020-09-12 LAB — CBC WITH DIFFERENTIAL/PLATELET
Abs Immature Granulocytes: 0.01 10*3/uL (ref 0.00–0.07)
Basophils Absolute: 0 10*3/uL (ref 0.0–0.1)
Basophils Relative: 1 %
Eosinophils Absolute: 0.3 10*3/uL (ref 0.0–0.5)
Eosinophils Relative: 5 %
HCT: 42.4 % (ref 36.0–46.0)
Hemoglobin: 13.4 g/dL (ref 12.0–15.0)
Immature Granulocytes: 0 %
Lymphocytes Relative: 13 %
Lymphs Abs: 0.7 10*3/uL (ref 0.7–4.0)
MCH: 32.3 pg (ref 26.0–34.0)
MCHC: 31.6 g/dL (ref 30.0–36.0)
MCV: 102.2 fL — ABNORMAL HIGH (ref 80.0–100.0)
Monocytes Absolute: 0.7 10*3/uL (ref 0.1–1.0)
Monocytes Relative: 13 %
Neutro Abs: 3.8 10*3/uL (ref 1.7–7.7)
Neutrophils Relative %: 68 %
Platelets: 187 10*3/uL (ref 150–400)
RBC: 4.15 MIL/uL (ref 3.87–5.11)
RDW: 14.8 % (ref 11.5–15.5)
WBC: 5.5 10*3/uL (ref 4.0–10.5)
nRBC: 0 % (ref 0.0–0.2)

## 2020-09-12 LAB — IRON AND TIBC
Iron: 92 ug/dL (ref 28–170)
Saturation Ratios: 19 % (ref 10.4–31.8)
TIBC: 490 ug/dL — ABNORMAL HIGH (ref 250–450)
UIBC: 398 ug/dL

## 2020-09-12 LAB — VITAMIN B12: Vitamin B-12: 1525 pg/mL — ABNORMAL HIGH (ref 180–914)

## 2020-09-12 LAB — RETIC PANEL
Immature Retic Fract: 14.4 % (ref 2.3–15.9)
RBC.: 4.14 MIL/uL (ref 3.87–5.11)
Retic Count, Absolute: 90.5 10*3/uL (ref 19.0–186.0)
Retic Ct Pct: 2.2 % (ref 0.4–3.1)
Reticulocyte Hemoglobin: 33.4 pg (ref >27.9)

## 2020-09-12 LAB — FOLATE: Folate: 80 ng/mL (ref >5.9)

## 2020-09-12 LAB — FERRITIN: Ferritin: 25 ng/mL (ref 11–307)

## 2020-09-12 NOTE — Assessment & Plan Note (Signed)
Continue metoprolol and xarelto.  Stable.  Rate controlled.

## 2020-09-12 NOTE — Assessment & Plan Note (Signed)
Follow liver function tests.  Followed by GI.  

## 2020-09-12 NOTE — Progress Notes (Signed)
Hematology/Oncology follow up note [facility] [phone] [phone]   Patient Care Team: Einar Pheasant, MD as PCP - General (Unknown Physician Specialty) [name], MD as PCP - Cardiology (Cardiology) Bensimhon, Shaune Pascal, MD as PCP - Advanced Heart Failure (Cardiology) Byrnett, Forest Gleason, MD (General Surgery) Earlie Server, MD as Consulting Physician (Oncology)  REFERRING PROVIDER: Einar Pheasant, MD CHIEF COMPLAINTS/REASON FOR VISIT:  Evaluation of anemia  HISTORY OF PRESENTING ILLNESS:  Ruth Roth is a  87 y.o.  female with PMH listed below who was referred to me for evaluation of anemia Reviewed patient's recent labs, Labs revealed anemia with hemoglobin of 10.2 on 01/27/2018. MCV 88.9,   Reviewed patient's previous labs ordered by primary care physician's office, anemia is chronic onset , duration is since 2016, gradually declines. No aggravating or improving factors. Ruth Roth takes Xarelto for A Fib. Has recurrent epistaxis. History of IDA, cannot tolerate oral iron supplements.   Associated signs and symptoms: Patient reports fatigue.  deniesSOB with exertion.  Denies weight loss, easy bruising, hematochezia, hemoptysis, hematuria. Context: History of GI bleeding: deneis               History of Chronic kidney disease: CKD               Last colonoscopy: 2007 Patient has a history of autoimmune hepatitis/PBC  Follows up with gastroenterology.  # received IV Feraheme 510 mg x 2. INTERVAL HISTORY [name] is a 87 y.o. female who has above history reviewed by me today presents for follow up visit for management of anemia Problems and complaints are listed below:  Patient is on chronic anticoagulation with Xarelto for atrial fibrillation. Patient did not get blood work done prior to today's visit.  Ruth Roth reports feeling well.  Chronic fatigue at baseline.  Not worse.  No nausea vomiting diarrhea    Review of Systems   Constitutional:  Positive for fatigue. Negative for appetite change, chills and fever.  HENT:   Negative for hearing loss and voice change.   Eyes:  Negative for eye problems.  Respiratory:  Negative for chest tightness and cough.   Cardiovascular:  Negative for chest pain.  Gastrointestinal:  Negative for abdominal distention, abdominal pain and blood in stool.  Endocrine: Negative for hot flashes.  Genitourinary:  Negative for difficulty urinating and frequency.   Musculoskeletal:  Negative for arthralgias.  Skin:  Negative for itching and rash.  Neurological:  Negative for extremity weakness.  Hematological:  Negative for adenopathy.  Psychiatric/Behavioral:  Negative for confusion.    MEDICAL HISTORY:  Past Medical History:  Diagnosis Date   (HFpEF) heart failure with preserved ejection fraction ([place])    a. 07/2017 Echo: EF 50-55%, no rwma, mild AS/AI, mod MR, mod dil LA, mod TR, PASP 54mmHg.   Autoimmune hepatitis ([place])    followed by Dr [name]   Fibrocystic breast disease    Glaucoma    Hypercholesterolemia    Hypertension    Hypothyroidism    Inflammatory arthritis    MI (myocardial infarction) ([place])    Neuropathy    Non-obstructive Coronary artery disease    a. 05/2014 NSTEMI/Cath: LM nl, LAD mild dzs, LCX 60ost hazy (? culprit), RCA mild dzs. EF 60% by echo-->Med Rx; b. 08/2017 Cath: LM 30ost, LAD min irregs, LCX small, 40ost/p, RCA large, min irregs, RPDA/RPL1 nl, EF 50-55%. 4+MR.   Osteoarthritis    Permanent atrial fibrillation (HCC)    a. CHA2DS2VASc = 6-->Xarelto.  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 07/2017 Echo: Mod MR; b. 08/2017 Cath: 4+/Severe MR.    SURGICAL HISTORY: Past Surgical History:  Procedure Laterality Date   ABDOMINAL HYSTERECTOMY  1963   partial, secondary to fibroids   APPENDECTOMY     Billroth     CARDIAC CATHETERIZATION  05/12/14   [place]    CARDIOVERSION N/A 12/26/2016   Procedure: CARDIOVERSION;  Surgeon: [name], MD;  Location: [facility] ORS;  Service: Cardiovascular;  Laterality: N/A;   CARDIOVERSION N/A 05/16/2017   Procedure: CARDIOVERSION;  Surgeon: [name], MD;  Location: [facility] ORS;  Service: Cardiovascular;  Laterality: N/A;   CARDIOVERSION N/A 06/09/2017   Procedure: CARDIOVERSION;  Surgeon: [name], MD;  Location: [facility] ORS;  Service: Cardiovascular;  Laterality: N/A;   CARDIOVERSION N/A 11/30/2018   Procedure: CARDIOVERSION;  Surgeon: [name], MD;  Location: [facility] ORS;  Service: Cardiovascular;  Laterality: N/A;   CHOLECYSTECTOMY  1998   COLONOSCOPY  2009   RIGHT/LEFT HEART CATH AND CORONARY ANGIOGRAPHY Bilateral 08/21/2017   Procedure: RIGHT/LEFT HEART CATH AND CORONARY ANGIOGRAPHY;  Surgeon: [name], MD;  Location: [place] CV LAB;  Service: Cardiovascular;  Laterality: Bilateral;   TEE WITHOUT CARDIOVERSION N/A 08/25/2017   Procedure: TRANSESOPHAGEAL ECHOCARDIOGRAM (TEE);  Surgeon: Jolaine Artist, MD;  Location: [facility] ENDOSCOPY;  Service: Cardiovascular;  Laterality: N/A;   UPPER GI ENDOSCOPY  2004    SOCIAL HISTORY: Social History   Socioeconomic History   Marital status: Widowed    Spouse name: Not on file   Number of children: 2   Years of education: Not on file   Highest education level: Not on file  Occupational History   Occupation: retired  Tobacco Use   Smoking status: Never   Smokeless tobacco: Never  Vaping Use   Vaping Use: Never used  Substance and Sexual Activity   Alcohol use: No    Alcohol/week: 0.0 standard drinks   Drug use: No   Sexual activity: Never  Other Topics Concern   Not on file  Social History Narrative   Not on file   Social Determinants of Health   Financial Resource Strain: Low Risk    Difficulty of Paying Living Expenses: Not hard at all  Food Insecurity: No Food Insecurity   Worried About Charity fundraiser in the  Last Year: Never true   [place] in the Last Year: Never true  Transportation Needs: No Transportation Needs   Lack of Transportation (Medical): No   Lack of Transportation (Non-Medical): No  Physical Activity: Insufficiently Active   Days of Exercise per Week: 3 days   Minutes of Exercise per Session: 40 min  Stress: No Stress Concern Present   Feeling of Stress : Not at all  Social Connections: Unknown   Frequency of Communication with Friends and Family: More than three times a week   Frequency of Social Gatherings with Friends and Family: More than three times a week   Attends Religious Services: Not on Electrical engineer or Organizations: Not on file   Attends Archivist Meetings: Not on file   Marital Status: Not on file  Intimate Partner Violence: Not At Risk   Fear of Current or Ex-Partner: No   Emotionally Abused: No   Physically Abused: No   Sexually Abused: No  FAMILY HISTORY: Family History  Problem Relation Age of Onset   Stroke Mother    Esophageal cancer Brother        also had lung cancer   Ovarian cancer Daughter    Colon cancer Daughter    Breast cancer Neg Hx     ALLERGIES:  is allergic to statins, haldol [haloperidol lactate], librax [chlordiazepoxide-clidinium], plavix [clopidogrel bisulfate], and ramipril.  MEDICATIONS:  Current Outpatient Medications  Medication Sig Dispense Refill   colchicine 0.6 MG tablet Take 1 tablet (0.6 mg total) by mouth daily as needed. 10 tablet 2   [employer] VITAMIN B12 1000 MCG tablet TAKE 1 TABLET BY MOUTH EVERY DAY 60 tablet 1   dorzolamide (TRUSOPT) 2 % ophthalmic solution Place 1 drop into the left eye 2 (two) times daily.     fluticasone (FLONASE) 50 MCG/ACT nasal spray Place 2 sprays into both nostrils daily as needed for allergies.      gabapentin (NEURONTIN) 300 MG capsule One capsule tid and two capsules q hs. 150 capsule 2   hydroxychloroquine (PLAQUENIL) 200 MG tablet Take 1 tablet by  mouth daily.     ipratropium (ATROVENT) 0.03 % nasal spray SMARTSIG:2 Spray(s) Both Nares 3 Times Daily PRN     latanoprost (XALATAN) 0.005 % ophthalmic solution Place 1 drop into both eyes at bedtime.      levothyroxine (SYNTHROID) 112 MCG tablet Take 112 mcg by mouth daily before breakfast.     Multiple Vitamin (MULTI-VITAMIN) tablet Take 1 tablet by mouth at bedtime.      pantoprazole (PROTONIX) 20 MG tablet TAKE 1 TABLET BY MOUTH EVERY DAY 90 tablet 1   Rivaroxaban (XARELTO) 15 MG TABS tablet TAKE 1 TABLET (15 MG TOTAL) BY MOUTH DAILY WITH SUPPER. 90 tablet 1   torsemide (DEMADEX) 20 MG tablet ALTERNATE 2 TABLETS BY MOUTH ONE DAY AND 1 TABLET THE NEXT DAY. 144 tablet 0   traMADol (ULTRAM) 50 MG tablet Take 1 tablet (50 mg total) by mouth 2 (two) times daily as needed (for pain.). 60 tablet 0   ursodiol (ACTIGALL) 300 MG capsule Take 300-600 mg by mouth See admin instructions. Take 2 capsules (600 mg) daily in the morning   & 1 capsule (300 mg) at night.     metoprolol tartrate (LOPRESSOR) 100 MG tablet Take 1 tablet (100 mg total) by mouth 2 (two) times daily. 180 tablet 2   No current facility-administered medications for this visit.     PHYSICAL EXAMINATION: ECOG PERFORMANCE STATUS: 1 - Symptomatic but completely ambulatory Vitals:   09/12/20 1337  BP: 130/71  Pulse: 75  Resp: 17  Temp: (!) 96.6 F (35.9 C)  SpO2: 93%   Filed Weights   09/12/20 1337  Weight: 136 lb (61.7 kg)    Physical Exam Constitutional:      General: Ruth Roth is not in acute distress. HENT:     Head: Normocephalic and atraumatic.  Eyes:     General: No scleral icterus.    Pupils: Pupils are equal, round, and reactive to light.  Cardiovascular:     Rate and Rhythm: Normal rate and regular rhythm.     Heart sounds: Normal heart sounds.     Comments: Irregular rhythm.  Pulmonary:     Effort: Pulmonary effort is normal. No respiratory distress.     Breath sounds: No wheezing.  Abdominal:     General:  Bowel sounds are normal. There is no distension.     Palpations: Abdomen is soft. There is  no mass.     Tenderness: There is no abdominal tenderness.  Musculoskeletal:        General: No deformity. Normal range of motion.     Cervical back: Normal range of motion and neck supple.  Skin:    General: Skin is warm and dry.     Findings: No erythema or rash.  Neurological:     Mental Status: Ruth Roth is alert and oriented to person, place, and time. Mental status is at baseline.     Cranial Nerves: No cranial nerve deficit.     Coordination: Coordination normal.  Psychiatric:        Mood and Affect: Mood normal.     LABORATORY DATA:  I have reviewed the data as listed Lab Results  Component Value Date   WBC 5.5 09/12/2020   HGB 13.4 09/12/2020   HCT 42.4 09/12/2020   MCV 102.2 (H) 09/12/2020   PLT 187 09/12/2020   Recent Labs    11/16/19 1456 11/16/19 1456 02/10/20 1542 04/05/20 1430 05/08/20 1312 06/22/20 1018 09/01/20 1343  NA 136  --  143 138 138 140 137  K 4.8  --  4.8 4.0 3.8 4.6 5.2*  CL 106  --  109* 101 101 106 102  CO2 19*  --  18* 25 25 26 27   GLUCOSE 85  --  128* 97 119* 88 70  BUN 69*  --  59* 92* 82* 46* 52*  CREATININE 2.08*  --  2.10* 1.80* 1.53* 1.64* 1.89*  CALCIUM 9.5  --  9.2 9.3 9.4 9.1 9.0  GFRNONAA 21*  --  20* 27* 32*  --  25*  GFRAA 24*  --  24*  --   --   --   --   PROT  --   --  6.5 6.8 6.9 6.3  --   ALBUMIN  --   --  4.4 4.0 4.1 3.9  --   AST  --    < > 11 18 15 11   --   ALT  --    < > 29 19 20 13   --   ALKPHOS  --    < > 92 69 87 77  --   BILITOT  --   --  0.3 0.6 0.6 0.5  --   BILIDIR  --   --   --   --   --  0.1  --    < > = values in this interval not displayed.    Iron/TIBC/Ferritin/ %Sat    Component Value Date/Time   IRON 92 09/12/2020 1312   TIBC 490 (H) 09/12/2020 1312   FERRITIN 25 09/12/2020 1312   IRONPCTSAT 19 09/12/2020 1312     RADIOGRAPHIC STUDIES: I have personally reviewed the radiological images as listed and  agreed with the findings in the report. 06/17/2018 Ultrasound abdomen complete Heterogeneous echotexture in the liver.  Extrahepatic and intrahepatic.  Duct dilation, stable since prior study. Small hyperechoic area within the spleen the largest 7 mm.  Compatible with a hemangioma.  Stable Prior cholecystectomy. Mildly increased texture in the kidney suggest chronic kidney disease.  ASSESSMENT & PLAN:  1. Iron deficiency anemia due to chronic blood loss   2. Macrocytosis    #Iron deficiency anemia Some of the labs are available after patient's visit CBC showed hemoglobin is stable. Iron labs are stable with normal iron saturation 19, and ferritin level 25. I will hold off IV Feraheme at this point   #Macrocytosis, MCV  105.  ?  Underlying MDS.  Ruth Roth is asymptomatic.   Vitamin B12 level was elevated in 3000.  Continue over-the-counter vitamin B12 500 MCG weekly.  #History of autoimmune hepatitis/PBC, continue follow-up with gastroenterology.  Orders Placed This Encounter  Procedures   CBC with Differential/Platelet    Standing Status:   Future    Standing Expiration Date:   09/12/2021   Ferritin    Standing Status:   Future    Standing Expiration Date:   09/12/2021   Folate    Standing Status:   Future    Standing Expiration Date:   09/12/2021   Iron and TIBC    Standing Status:   Future    Standing Expiration Date:   09/12/2021   Vitamin B12    Standing Status:   Future    Standing Expiration Date:   09/12/2021    All questions were answered. The patient knows to call the clinic with any problems questions or concerns. We spent sufficient time to discuss many aspect of care, questions were answered to patient's satisfaction.   Return of visit: 6 months   Earlie Server, MD, PhD Hematology Oncology [facility] Pager- [phone] 09/12/2020

## 2020-09-12 NOTE — Assessment & Plan Note (Signed)
Continue prolia.  

## 2020-09-12 NOTE — Assessment & Plan Note (Signed)
On thyroid replacement.  Follow tsh.  

## 2020-09-12 NOTE — Assessment & Plan Note (Signed)
Upper symptoms controlled.  On protonix.  

## 2020-09-12 NOTE — Assessment & Plan Note (Signed)
No evidence of volume overload.  Improved with current medication regimen.  Continue torsemide.

## 2020-09-12 NOTE — Assessment & Plan Note (Addendum)
Meningioma noted on previous MRI.  Saw Dr Jorene Minors (oncology [facility]).  Recommended f/u in 4 years.  Overdue.  Discuss with Ms Pinho regarding f/u.  Of note, no mention on MRI 2018.

## 2020-09-12 NOTE — Progress Notes (Signed)
Patient here for oncology follow-up appointment, expresses concerns of diarrhea and SOB with exertion

## 2020-09-12 NOTE — Assessment & Plan Note (Signed)
Continue torsemide and metoprolol.  Continue xarelto.  No evidence of volume overload on exam.  Breathing stable.

## 2020-09-12 NOTE — Assessment & Plan Note (Signed)
Continue losartan and metoprolol.  Pressures controlled.  Follow pressure.  Follow metabolic panel.

## 2020-09-12 NOTE — Assessment & Plan Note (Signed)
Unable to take statin medication.  Follow lipid panel.  

## 2020-09-12 NOTE — Assessment & Plan Note (Signed)
Avoid antiinflammatories.  Follow metabolic panel.  

## 2020-09-12 NOTE — Assessment & Plan Note (Signed)
Followed by cardiology 

## 2020-09-12 NOTE — Assessment & Plan Note (Signed)
Has seen hematology.  Follow cbc and iron studies.  

## 2020-09-12 NOTE — Assessment & Plan Note (Signed)
Followed by GI.  Stable.  Follow liver function tests.  

## 2020-09-17 DIAGNOSIS — Z20822 Contact with and (suspected) exposure to covid-19: Secondary | ICD-10-CM | POA: Diagnosis not present

## 2020-09-19 ENCOUNTER — Other Ambulatory Visit: Payer: Self-pay

## 2020-09-19 ENCOUNTER — Other Ambulatory Visit (INDEPENDENT_AMBULATORY_CARE_PROVIDER_SITE_OTHER): Payer: Medicare Other

## 2020-09-19 DIAGNOSIS — I5032 Chronic diastolic (congestive) heart failure: Secondary | ICD-10-CM

## 2020-09-20 LAB — BASIC METABOLIC PANEL
BUN/Creatinine Ratio: 26 (ref 12–28)
BUN: 33 mg/dL (ref 10–36)
CO2: 23 mmol/L (ref 20–29)
Calcium: 9 mg/dL (ref 8.7–10.3)
Chloride: 102 mmol/L (ref 96–106)
Creatinine, Ser: 1.29 mg/dL — ABNORMAL HIGH (ref 0.57–1.00)
Glucose: 93 mg/dL (ref 65–99)
Potassium: 5 mmol/L (ref 3.5–5.2)
Sodium: 140 mmol/L (ref 134–144)
eGFR: 39 mL/min/{1.73_m2} — ABNORMAL LOW (ref >59)

## 2020-09-21 DIAGNOSIS — M810 Age-related osteoporosis without current pathological fracture: Secondary | ICD-10-CM | POA: Diagnosis not present

## 2020-09-21 DIAGNOSIS — E559 Vitamin D deficiency, unspecified: Secondary | ICD-10-CM | POA: Diagnosis not present

## 2020-09-28 ENCOUNTER — Telehealth: Payer: Self-pay | Admitting: Internal Medicine

## 2020-09-28 DIAGNOSIS — I10 Essential (primary) hypertension: Secondary | ICD-10-CM | POA: Diagnosis not present

## 2020-09-28 DIAGNOSIS — N184 Chronic kidney disease, stage 4 (severe): Secondary | ICD-10-CM | POA: Diagnosis not present

## 2020-09-28 DIAGNOSIS — D631 Anemia in chronic kidney disease: Secondary | ICD-10-CM | POA: Diagnosis not present

## 2020-09-28 DIAGNOSIS — N2581 Secondary hyperparathyroidism of renal origin: Secondary | ICD-10-CM | POA: Diagnosis not present

## 2020-09-28 NOTE — Telephone Encounter (Signed)
Opened in error

## 2020-09-28 NOTE — Telephone Encounter (Signed)
Please notify Ruth Roth that I spoke to Dr Jefm Bryant.  At her previous visit, she was concerned about a possible rash to plaquenil.  Dr Jefm Bryant is ok with her stopping plaquenil.  He recommended that we could start allopurinol 50mg  q day - to prevent further gout flares.  If any questions or problems, let me know.

## 2020-09-29 NOTE — Telephone Encounter (Signed)
LMTCB

## 2020-10-02 ENCOUNTER — Other Ambulatory Visit: Payer: Self-pay

## 2020-10-02 NOTE — Telephone Encounter (Signed)
The lowest tablet it comes in is 100mg .  Given her renal function, he had suggested 50mg .  If can cut in 1/2, would recommend 1/2 tablet q day.

## 2020-10-02 NOTE — Telephone Encounter (Signed)
I talked to Ruth Roth and she is agreeable to do this. Also d/c'd her plaquenil but when I tried to send in allopurinol it is only giving me 100 mg tablets and the directions state 50 mg q day. Just want to confirm with you what needs to be sent in before sending to pharmacy.

## 2020-10-03 ENCOUNTER — Other Ambulatory Visit: Payer: Self-pay

## 2020-10-03 MED ORDER — ALLOPURINOL 100 MG PO TABS: 50.0000 mg | ORAL_TABLET | Freq: Every day | ORAL | 1 refills | Status: DC

## 2020-10-03 NOTE — Telephone Encounter (Signed)
Pt returned your call.  

## 2020-10-03 NOTE — Telephone Encounter (Signed)
LMTCB

## 2020-10-03 NOTE — Telephone Encounter (Signed)
Rx sent in for allopurinol 100 mg- 0.5 tablets q day.

## 2020-10-04 NOTE — Telephone Encounter (Signed)
Patient is aware that allopurinol has been sent in and that she is to stop plaquenil and start allopurinol 1/2 tablet (50mg ) once daily.

## 2020-10-11 DIAGNOSIS — M810 Age-related osteoporosis without current pathological fracture: Secondary | ICD-10-CM | POA: Diagnosis not present

## 2020-10-17 ENCOUNTER — Other Ambulatory Visit: Payer: Self-pay | Admitting: Internal Medicine

## 2020-10-20 DIAGNOSIS — H401132 Primary open-angle glaucoma, bilateral, moderate stage: Secondary | ICD-10-CM | POA: Diagnosis not present

## 2020-10-24 ENCOUNTER — Other Ambulatory Visit: Payer: Self-pay | Admitting: Internal Medicine

## 2020-10-24 DIAGNOSIS — Z1231 Encounter for screening mammogram for malignant neoplasm of breast: Secondary | ICD-10-CM

## 2020-11-01 ENCOUNTER — Other Ambulatory Visit: Payer: Self-pay

## 2020-11-01 ENCOUNTER — Ambulatory Visit
Admission: RE | Admit: 2020-11-01 | Discharge: 2020-11-01 | Disposition: A | Discharge status: Home / Self Care | Payer: Medicare Other | Source: Ambulatory Visit | Attending: Internal Medicine | Admitting: Internal Medicine

## 2020-11-01 DIAGNOSIS — Z1231 Encounter for screening mammogram for malignant neoplasm of breast: Secondary | ICD-10-CM | POA: Insufficient documentation

## 2020-11-16 ENCOUNTER — Ambulatory Visit (INDEPENDENT_AMBULATORY_CARE_PROVIDER_SITE_OTHER): Payer: Medicare Other | Admitting: Internal Medicine

## 2020-11-16 ENCOUNTER — Other Ambulatory Visit: Payer: Self-pay

## 2020-11-16 ENCOUNTER — Encounter: Payer: Self-pay | Admitting: Internal Medicine

## 2020-11-16 VITALS — BP 128/70 | HR 92 | Temp 97.9°F | Resp 16 | Ht 62.0 in | Wt 137.6 lb

## 2020-11-16 DIAGNOSIS — I5032 Chronic diastolic (congestive) heart failure: Secondary | ICD-10-CM | POA: Diagnosis not present

## 2020-11-16 DIAGNOSIS — I272 Pulmonary hypertension, unspecified: Secondary | ICD-10-CM | POA: Diagnosis not present

## 2020-11-16 DIAGNOSIS — I10 Essential (primary) hypertension: Secondary | ICD-10-CM

## 2020-11-16 DIAGNOSIS — D500 Iron deficiency anemia secondary to blood loss (chronic): Secondary | ICD-10-CM

## 2020-11-16 DIAGNOSIS — N1832 Chronic kidney disease, stage 3b: Secondary | ICD-10-CM

## 2020-11-16 DIAGNOSIS — E039 Hypothyroidism, unspecified: Secondary | ICD-10-CM | POA: Diagnosis not present

## 2020-11-16 DIAGNOSIS — D329 Benign neoplasm of meninges, unspecified: Secondary | ICD-10-CM | POA: Diagnosis not present

## 2020-11-16 DIAGNOSIS — K219 Gastro-esophageal reflux disease without esophagitis: Secondary | ICD-10-CM | POA: Diagnosis not present

## 2020-11-16 DIAGNOSIS — J329 Chronic sinusitis, unspecified: Secondary | ICD-10-CM | POA: Diagnosis not present

## 2020-11-16 DIAGNOSIS — I2510 Atherosclerotic heart disease of native coronary artery without angina pectoris: Secondary | ICD-10-CM

## 2020-11-16 DIAGNOSIS — K754 Autoimmune hepatitis: Secondary | ICD-10-CM | POA: Diagnosis not present

## 2020-11-16 DIAGNOSIS — E7800 Pure hypercholesterolemia, unspecified: Secondary | ICD-10-CM

## 2020-11-16 DIAGNOSIS — I4819 Other persistent atrial fibrillation: Secondary | ICD-10-CM

## 2020-11-16 DIAGNOSIS — M810 Age-related osteoporosis without current pathological fracture: Secondary | ICD-10-CM

## 2020-11-16 DIAGNOSIS — J029 Acute pharyngitis, unspecified: Secondary | ICD-10-CM | POA: Diagnosis not present

## 2020-11-16 DIAGNOSIS — K743 Primary biliary cirrhosis: Secondary | ICD-10-CM

## 2020-11-16 MED ORDER — AMOXICILLIN 875 MG PO TABS: 875.0000 mg | ORAL_TABLET | Freq: Two times a day (BID) | ORAL | 0 refills | Status: AC

## 2020-11-16 NOTE — Patient Instructions (Signed)
Take a probiotic daily while you are on the antibiotics and for two weeks after completing the antibiotics.    Examples of probiotics are:  align, florastor or culturelle.

## 2020-11-16 NOTE — Progress Notes (Signed)
[name], female   DOB: 07-18-1928, 87 y.o.   MRN: [id number]   Subjective:    [name], female    DOB: Nov 26, 1928, 87 y.o.   MRN: [id number]  This visit occurred during the SARS-CoV-2 public health emergency.  Safety protocols were in place, including screening questions prior to the visit, additional usage of staff PPE, and extensive cleaning of exam room while observing appropriate contact time as indicated for disinfecting solutions.   Patient here for scheduled follow up.   Chief Complaint  Patient presents with   Hypertension   Hypothyroidism   Osteoporosis   Atrial Fibrillation   .   HPI Seeing nephrology - f/u CKD.  Off losartan.  Stable.  Eating.  No nausea or vomiting.  Does report head pressure.  Clear mucus.  Sore throat.  Some drainage.  Started over the last couple of days.  No chest congestion.  Using flonase.  No chest pain or increased sob.  Breathing stable. Seeing Dr Honor Junes - prolia.  Planning f/u bone density 11/2021.  No abdominal pain.  Bowels moving.     Past Medical History:  Diagnosis Date   (HFpEF) heart failure with preserved ejection fraction ([place])    a. 07/2017 Echo: EF 50-55%, no rwma, mild AS/AI, mod MR, mod dil LA, mod TR, PASP 21mmHg.   Autoimmune hepatitis ([place])    followed by Dr [name]   Fibrocystic breast disease    Glaucoma    Hypercholesterolemia    Hypertension    Hypothyroidism    Inflammatory arthritis    MI (myocardial infarction) ([place])    Neuropathy    Non-obstructive Coronary artery disease    a. 05/2014 NSTEMI/Cath: LM nl, LAD mild dzs, LCX 60ost hazy (? culprit), RCA mild dzs. EF 60% by echo-->Med Rx; b. 08/2017 Cath: LM 30ost, LAD min irregs, LCX small, 40ost/p, RCA large, min irregs, RPDA/RPL1 nl, EF 50-55%. 4+MR.   Osteoarthritis    Permanent atrial fibrillation (HCC)    a. CHA2DS2VASc = 6-->Xarelto.   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    Severe mitral regurgitation    a. 07/2017 Echo: Mod MR; b. 08/2017 Cath: 4+/Severe MR.   Past Surgical History:  Procedure Laterality Date   ABDOMINAL HYSTERECTOMY  1963   partial, secondary to fibroids   APPENDECTOMY     Billroth     CARDIAC CATHETERIZATION  05/12/14   [place]   CARDIOVERSION N/A 12/26/2016   Procedure: CARDIOVERSION;  Surgeon: [name], MD;  Location: [facility];  Service: Cardiovascular;  Laterality: N/A;   CARDIOVERSION N/A 05/16/2017   Procedure: CARDIOVERSION;  Surgeon: [name], MD;  Location: [facility];  Service: Cardiovascular;  Laterality: N/A;   CARDIOVERSION N/A 06/09/2017   Procedure: CARDIOVERSION;  Surgeon: [name], MD;  Location: [facility];  Service: Cardiovascular;  Laterality: N/A;   CARDIOVERSION N/A 11/30/2018   Procedure: CARDIOVERSION;  Surgeon: [name], MD;  Location: [facility];  Service: Cardiovascular;  Laterality: N/A;   CHOLECYSTECTOMY  1998   COLONOSCOPY  2009   RIGHT/LEFT HEART CATH AND CORONARY ANGIOGRAPHY Bilateral 08/21/2017   Procedure: RIGHT/LEFT HEART CATH AND CORONARY ANGIOGRAPHY;  Surgeon: [name], MD;  Location: [place] [facility];  Service: Cardiovascular;  Laterality: Bilateral;   TEE WITHOUT CARDIOVERSION N/A 08/25/2017   Procedure: TRANSESOPHAGEAL ECHOCARDIOGRAM (TEE);  Surgeon: Jolaine Artist, MD;  Location: [place];  Service: Cardiovascular;  Laterality: N/A;   UPPER GI ENDOSCOPY  2004   Family History  Problem Relation Age of Onset   Stroke Mother    Esophageal cancer Brother        also had lung cancer   Ovarian cancer Daughter    Colon cancer Daughter    Breast cancer Neg Hx    Social History   Socioeconomic History   Marital status: Widowed    Spouse name: Not on file   Number of children: 2   Years of education: Not on file   Highest education level: Not on file  Occupational History   Occupation: retired  Tobacco Use   Smoking status: Never   Smokeless tobacco:  Never  Vaping Use   Vaping Use: Never used  Substance and Sexual Activity   Alcohol use: No    Alcohol/week: 0.0 standard drinks   Drug use: No   Sexual activity: Never  Other Topics Concern   Not on file  Social History Narrative   Not on file   Social Determinants of Health   Financial Resource Strain: Low Risk    Difficulty of Paying Living Expenses: Not hard at all  Food Insecurity: No Food Insecurity   Worried About Charity fundraiser in the Last Year: Never true   [place] in the Last Year: Never true  Transportation Needs: No Transportation Needs   Lack of Transportation (Medical): No   Lack of Transportation (Non-Medical): No  Physical Activity: Insufficiently Active   Days of Exercise per Week: 3 days   Minutes of Exercise per Session: 40 min  Stress: No Stress Concern Present   Feeling of Stress : Not at all  Social Connections: Unknown   Frequency of Communication with Friends and Family: More than three times a week   Frequency of Social Gatherings with Friends and Family: More than three times a week   Attends Religious Services: Not on Electrical engineer or Organizations: Not on file   Attends Archivist Meetings: Not on file   Marital Status: Not on file    Review of Systems  Constitutional:  Negative for appetite change and unexpected weight change.  HENT:  Positive for congestion and sinus pressure.   Respiratory:  Negative for cough, chest tightness and shortness of breath.   Cardiovascular:  Negative for chest pain, palpitations and leg swelling.  Gastrointestinal:  Negative for abdominal pain, diarrhea, nausea and vomiting.  Genitourinary:  Negative for difficulty urinating and dysuria.  Musculoskeletal:  Negative for joint swelling and myalgias.  Skin:  Negative for color change and rash.  Neurological:  Negative for dizziness, light-headedness and headaches.  Psychiatric/Behavioral:  Negative for agitation and dysphoric  mood.       Objective:     BP 128/70   Pulse 92   Temp 97.9 F (36.6 C)   Resp 16   Ht 5\' 2"  (1.575 m)   Wt 137 lb 9.6 oz (62.4 kg)   SpO2 97%   BMI 25.17 kg/m  Wt Readings from Last 3 Encounters:  11/16/20 137 lb 9.6 oz (62.4 kg)  09/12/20 136 lb (61.7 kg)  09/07/20 140 lb 3.2 oz (63.6 kg)    Physical Exam Vitals reviewed.  Constitutional:      General: She is not in acute distress.    Appearance: Normal appearance.  HENT:     Head: Normocephalic and atraumatic.     Comments: Increased pressure - palpation sinus.  Right Ear: Tympanic membrane, ear canal and external ear normal.     Left Ear: Tympanic membrane, ear canal and external ear normal.  Eyes:     General: No scleral icterus.       Right eye: No discharge.        Left eye: No discharge.     Conjunctiva/sclera: Conjunctivae normal.  Neck:     Thyroid: No thyromegaly.  Cardiovascular:     Rate and Rhythm: Normal rate and regular rhythm.  Pulmonary:     Effort: No respiratory distress.     Breath sounds: Normal breath sounds. No wheezing.  Abdominal:     General: Bowel sounds are normal.     Palpations: Abdomen is soft.     Tenderness: There is no abdominal tenderness.  Musculoskeletal:        General: No swelling or tenderness.     Cervical back: Neck supple. No tenderness.  Lymphadenopathy:     Cervical: No cervical adenopathy.  Skin:    Findings: No erythema or rash.  Neurological:     Mental Status: She is alert.  Psychiatric:        Mood and Affect: Mood normal.        Behavior: Behavior normal.     Outpatient Encounter Medications as of 11/16/2020  Medication Sig   amoxicillin (AMOXIL) 875 MG tablet Take 1 tablet (875 mg total) by mouth 2 (two) times daily for 10 days.   CYANOCOBALAMIN PO Take 500 mcg by mouth daily.   allopurinol (ZYLOPRIM) 100 MG tablet Take 0.5 tablets (50 mg total) by mouth daily.   colchicine 0.6 MG tablet Take 1 tablet (0.6 mg total) by mouth daily as needed.    dorzolamide (TRUSOPT) 2 % ophthalmic solution Place 1 drop into the left eye 2 (two) times daily.   fluticasone (FLONASE) 50 MCG/ACT nasal spray Place 2 sprays into both nostrils daily as needed for allergies.    gabapentin (NEURONTIN) 300 MG capsule One capsule tid and two capsules q hs.   ipratropium (ATROVENT) 0.03 % nasal spray SMARTSIG:2 Spray(s) Both Nares 3 Times Daily PRN   latanoprost (XALATAN) 0.005 % ophthalmic solution Place 1 drop into both eyes at bedtime.    levothyroxine (SYNTHROID) 112 MCG tablet Take 112 mcg by mouth daily before breakfast.   metoprolol tartrate (LOPRESSOR) 100 MG tablet Take 1 tablet (100 mg total) by mouth 2 (two) times daily.   Multiple Vitamin (MULTI-VITAMIN) tablet Take 1 tablet by mouth at bedtime.    pantoprazole (PROTONIX) 20 MG tablet TAKE 1 TABLET BY MOUTH EVERY DAY   Rivaroxaban (XARELTO) 15 MG TABS tablet TAKE 1 TABLET (15 MG TOTAL) BY MOUTH DAILY WITH SUPPER.   torsemide (DEMADEX) 20 MG tablet ALTERNATE 2 TABLETS BY MOUTH ONE DAY AND 1 TABLET THE NEXT DAY.   traMADol (ULTRAM) 50 MG tablet Take 1 tablet (50 mg total) by mouth 2 (two) times daily as needed (for pain.).   ursodiol (ACTIGALL) 300 MG capsule Take 300-600 mg by mouth See admin instructions. Take 2 capsules (600 mg) daily in the morning   & 1 capsule (300 mg) at night.   [DISCONTINUED] [employer] VITAMIN B12 1000 MCG tablet TAKE 1 TABLET BY MOUTH EVERY DAY   No facility-administered encounter medications on file as of 11/16/2020.     Lab Results  Component Value Date   WBC 5.5 09/12/2020   HGB 13.4 09/12/2020   HCT 42.4 09/12/2020   PLT 187 09/12/2020   GLUCOSE 93 09/19/2020  CHOL 143 06/22/2020   TRIG 139.0 06/22/2020   HDL 48.40 06/22/2020   LDLDIRECT 143.5 12/17/2012   LDLCALC 67 06/22/2020   ALT 13 06/22/2020   AST 11 06/22/2020   NA 140 09/19/2020   K 5.0 09/19/2020   CL 102 09/19/2020   CREATININE 1.29 (H) 09/19/2020   BUN 33 09/19/2020   CO2 23 09/19/2020   TSH 2.12  06/22/2020   INR 1.63 12/29/2017   HGBA1C 5.6 01/27/2018    MM 3D SCREEN BREAST BILATERAL  Result Date: 11/03/2020 CLINICAL DATA:  Screening. EXAM: DIGITAL SCREENING BILATERAL MAMMOGRAM WITH TOMOSYNTHESIS AND CAD TECHNIQUE: Bilateral screening digital craniocaudal and mediolateral oblique mammograms were obtained. Bilateral screening digital breast tomosynthesis was performed. The images were evaluated with computer-aided detection. COMPARISON:  Previous exam(s). ACR Breast Density Category c: The breast tissue is heterogeneously dense, which may obscure small masses. FINDINGS: There are no findings suspicious for malignancy. IMPRESSION: No mammographic evidence of malignancy. A result letter of this screening mammogram will be mailed directly to the patient. RECOMMENDATION: Screening mammogram in one year. (Code:SM-B-01Y) BI-RADS CATEGORY  1: Negative. Electronically Signed   By: Marin Olp M.D.   On: 11/03/2020 10:53      Assessment & Plan:   Problem List Items Addressed This Visit     Autoimmune hepatitis ([place])    Follow liver function tests.  Followed by GI.       Benign hypertension    Continue losartan and metoprolol.  Pressures controlled.  Follow pressure.  Follow metabolic panel.       Relevant Orders   Basic metabolic panel   Benign meningioma ([place])    Evaluated by Dr Jorene Minors (oncology [facility]).  Discussed with her today.  States was told no further w/up or f/u neded.  Of note, no mention on MRI 2018.       Chronic diastolic heart failure (HCC)    Continue torsemide and metoprolol.  Continue xarelto.  No evidence of volume overload on exam.  Breathing stable.       CKD (chronic kidney disease) stage 3, GFR 30-59 ml/min (HCC)    Avoid antiinflammatories.  Follow metabolic panel.       Coronary artery disease    Continue risk factor modification.  Continue metoprolol and losartan.       GERD (gastroesophageal reflux disease)    Upper symptoms controlled.  On  protonix.       Hypercholesteremia    Unable to take statin medication.  Follow lipid panel.       Relevant Orders   Hepatic function panel   Lipid panel   Hypothyroidism    On thyroid replacement.  Follow tsh.       Iron deficiency anemia    Has seen hematology.  Follow cbc and iron studies.       Relevant Medications   CYANOCOBALAMIN PO   Osteoporosis    Continue prolia.       Persistent atrial fibrillation (HCC)    Continue metoprolol and xarelto.  Stable.  Rate controlled.       Primary biliary cholangitis (HCC)    Followed by GI. Stable. Follow liver function tests.        Pulmonary hypertension, unspecified ([place])    Followed by cardiology.       Sinusitis    Symptoms feel c/w her previous sinus infections.  Treat with amoxicillin.  Saline nasal spray and flonase nasal spray as directed. Swab for covid.  Quarantine until results available.  Follow.  Call with update.       Relevant Medications   amoxicillin (AMOXIL) 875 MG tablet   Other Visit Diagnoses     Sore throat    -  Primary   Relevant Orders   Novel Coronavirus, NAA ([facility]) (Completed)        Einar Pheasant, MD

## 2020-11-18 LAB — NOVEL CORONAVIRUS, NAA: SARS-CoV-2, NAA: NOT DETECTED

## 2020-11-18 LAB — SARS-COV-2, NAA 2 DAY TAT

## 2020-11-19 ENCOUNTER — Encounter: Payer: Self-pay | Admitting: Internal Medicine

## 2020-11-19 NOTE — Assessment & Plan Note (Signed)
Followed by GI.  Stable.  Follow liver function tests.  

## 2020-11-19 NOTE — Assessment & Plan Note (Signed)
Continue prolia.  

## 2020-11-19 NOTE — Assessment & Plan Note (Signed)
Continue risk factor modification.  Continue metoprolol and losartan.

## 2020-11-19 NOTE — Assessment & Plan Note (Signed)
Follow liver function tests.  Followed by GI.  

## 2020-11-19 NOTE — Assessment & Plan Note (Signed)
Has seen hematology.  Follow cbc and iron studies.  

## 2020-11-19 NOTE — Assessment & Plan Note (Signed)
Avoid antiinflammatories.  Follow metabolic panel.  

## 2020-11-19 NOTE — Assessment & Plan Note (Signed)
Continue metoprolol and xarelto.  Stable.  Rate controlled.

## 2020-11-19 NOTE — Assessment & Plan Note (Signed)
Upper symptoms controlled.  On protonix.  

## 2020-11-19 NOTE — Assessment & Plan Note (Signed)
Symptoms feel c/w her previous sinus infections.  Treat with amoxicillin.  Saline nasal spray and flonase nasal spray as directed. Swab for covid.  Quarantine until results available.  Follow.  Call with update.

## 2020-11-19 NOTE — Assessment & Plan Note (Signed)
Evaluated by Dr Jorene Minors (oncology [facility]).  Discussed with her today.  States was told no further w/up or f/u neded.  Of note, no mention on MRI 2018.

## 2020-11-19 NOTE — Assessment & Plan Note (Signed)
On thyroid replacement.  Follow tsh.  

## 2020-11-19 NOTE — Assessment & Plan Note (Signed)
Continue losartan and metoprolol.  Pressures controlled.  Follow pressure.  Follow metabolic panel.

## 2020-11-19 NOTE — Assessment & Plan Note (Signed)
Continue torsemide and metoprolol.  Continue xarelto.  No evidence of volume overload on exam.  Breathing stable.

## 2020-11-19 NOTE — Assessment & Plan Note (Signed)
Unable to take statin medication.  Follow lipid panel.  

## 2020-11-19 NOTE — Assessment & Plan Note (Signed)
Followed by cardiology 

## 2020-12-01 ENCOUNTER — Other Ambulatory Visit: Payer: Self-pay | Admitting: Internal Medicine

## 2020-12-01 NOTE — Telephone Encounter (Signed)
LMTCB

## 2020-12-01 NOTE — Telephone Encounter (Signed)
Patient returned office phone call. 

## 2020-12-01 NOTE — Telephone Encounter (Signed)
Ok to refill thyroid medication.  Please confirm dose taking.

## 2020-12-01 NOTE — Telephone Encounter (Signed)
Are you ok with filling for her?

## 2020-12-01 NOTE — Telephone Encounter (Signed)
Patient calling in and needing her levothyroxine refilled. States she just needs Dr Nicki Reaper to fill this once until she can get a new thyroid doctor.   Pended for your approval or denial.

## 2020-12-04 ENCOUNTER — Other Ambulatory Visit: Payer: Self-pay

## 2020-12-04 ENCOUNTER — Other Ambulatory Visit: Payer: Self-pay | Admitting: Internal Medicine

## 2020-12-04 MED ORDER — LEVOTHYROXINE SODIUM 112 MCG PO TABS: 112.0000 ug | ORAL_TABLET | Freq: Every day | ORAL | 1 refills | Status: DC

## 2020-12-04 NOTE — Telephone Encounter (Signed)
Confirmed taking levothyroxine 112 mcg. Sent in rx to [facility] per pt request.

## 2020-12-07 ENCOUNTER — Other Ambulatory Visit
Admission: RE | Admit: 2020-12-07 | Discharge: 2020-12-07 | Disposition: A | Discharge status: Home / Self Care | Payer: Medicare Other | Source: Ambulatory Visit | Attending: Rheumatology | Admitting: Rheumatology

## 2020-12-07 DIAGNOSIS — G8929 Other chronic pain: Secondary | ICD-10-CM | POA: Insufficient documentation

## 2020-12-07 DIAGNOSIS — M25562 Pain in left knee: Secondary | ICD-10-CM | POA: Diagnosis not present

## 2020-12-07 DIAGNOSIS — K7460 Unspecified cirrhosis of liver: Secondary | ICD-10-CM | POA: Diagnosis not present

## 2020-12-07 DIAGNOSIS — M1990 Unspecified osteoarthritis, unspecified site: Secondary | ICD-10-CM | POA: Diagnosis not present

## 2020-12-07 DIAGNOSIS — E790 Hyperuricemia without signs of inflammatory arthritis and tophaceous disease: Secondary | ICD-10-CM | POA: Diagnosis not present

## 2020-12-07 DIAGNOSIS — N1832 Chronic kidney disease, stage 3b: Secondary | ICD-10-CM | POA: Diagnosis not present

## 2020-12-07 DIAGNOSIS — L509 Urticaria, unspecified: Secondary | ICD-10-CM | POA: Diagnosis not present

## 2020-12-07 LAB — SYNOVIAL CELL COUNT + DIFF, W/ CRYSTALS
Crystals, Fluid: NONE SEEN
Eosinophils-Synovial: 0 %
Lymphocytes-Synovial Fld: 42 %
Monocyte-Macrophage-Synovial Fluid: 54 %
Neutrophil, Synovial: 4 %
WBC, Synovial: 120 /mm3 (ref 0–200)

## 2020-12-26 ENCOUNTER — Other Ambulatory Visit: Payer: Self-pay | Admitting: Cardiovascular Disease

## 2020-12-26 ENCOUNTER — Other Ambulatory Visit: Payer: Self-pay | Admitting: Gastroenterology

## 2020-12-26 DIAGNOSIS — K7460 Unspecified cirrhosis of liver: Secondary | ICD-10-CM | POA: Diagnosis not present

## 2020-12-26 DIAGNOSIS — K219 Gastro-esophageal reflux disease without esophagitis: Secondary | ICD-10-CM | POA: Diagnosis not present

## 2020-12-26 NOTE — Telephone Encounter (Signed)
Prescription refill request for Xarelto received.  Indication:afib Last office visit:bensimhon 09/01/20 Weight:62.4kg Age:78f Scr:1.29 09/19/20 CrCl:28.6

## 2020-12-26 NOTE — Telephone Encounter (Signed)
Refill Request.  

## 2020-12-27 ENCOUNTER — Other Ambulatory Visit: Payer: Self-pay | Admitting: Cardiovascular Disease

## 2020-12-27 NOTE — Telephone Encounter (Signed)
Refill Request.  

## 2021-01-03 ENCOUNTER — Ambulatory Visit
Admission: RE | Admit: 2021-01-03 | Discharge: 2021-01-03 | Disposition: A | Discharge status: Home / Self Care | Payer: Medicare Other | Source: Ambulatory Visit | Attending: Gastroenterology | Admitting: Gastroenterology

## 2021-01-03 ENCOUNTER — Other Ambulatory Visit: Payer: Self-pay

## 2021-01-03 DIAGNOSIS — N281 Cyst of kidney, acquired: Secondary | ICD-10-CM | POA: Diagnosis not present

## 2021-01-03 DIAGNOSIS — K7460 Unspecified cirrhosis of liver: Secondary | ICD-10-CM | POA: Diagnosis not present

## 2021-01-04 ENCOUNTER — Ambulatory Visit (INDEPENDENT_AMBULATORY_CARE_PROVIDER_SITE_OTHER): Payer: Medicare Other | Admitting: Cardiovascular Disease

## 2021-01-04 VITALS — BP 148/78 | HR 92 | Ht 62.0 in | Wt 136.4 lb

## 2021-01-04 DIAGNOSIS — I482 Chronic atrial fibrillation, unspecified: Secondary | ICD-10-CM | POA: Diagnosis not present

## 2021-01-04 DIAGNOSIS — I2510 Atherosclerotic heart disease of native coronary artery without angina pectoris: Secondary | ICD-10-CM | POA: Diagnosis not present

## 2021-01-04 DIAGNOSIS — I5032 Chronic diastolic (congestive) heart failure: Secondary | ICD-10-CM

## 2021-01-04 DIAGNOSIS — I10 Essential (primary) hypertension: Secondary | ICD-10-CM | POA: Diagnosis not present

## 2021-01-04 DIAGNOSIS — I340 Nonrheumatic mitral (valve) insufficiency: Secondary | ICD-10-CM

## 2021-01-04 MED ORDER — METOPROLOL TARTRATE 100 MG PO TABS: 150.0000 mg | ORAL_TABLET | Freq: Two times a day (BID) | ORAL | 0 refills | Status: DC

## 2021-01-04 NOTE — Progress Notes (Signed)
Cardiology Office Note   Date:  01/04/2021   ID:  GERARD BONUS, DOB [birth year], MRN [id number]  PCP:  Einar Pheasant, MD  Cardiologist:   [name], MD   Chief Complaint  Patient presents with   Other    C/o sob and elevated HR. Meds reviewed verbally with pt.       History of Present Illness: ROBYNE MATAR is a 87 y.o. female who presents for a follow-up visit regarding coronary artery disease, chronic diastolic heart failure, mitral regurgitation and persistent atrial fibrillation .  She has known history of hypertension, chronic kidney disease, peptic ulcer disease, fibrocystic breast disease, inflammatory arthritis, autoimmune hepatitis with cirrhosis and neuropathy.  She had a small non-ST elevation myocardial infarction in March 2016 with acute diastolic heart failure after a GI illness.  Echocardiogram showed normal LV systolic function. Cardiac catheterization showed showed 60% ostial left circumflex hazy stenosis which was possibly the culprit. Mild LAD/RCA disease. She was treated medically.    She had worsening heart failure 2019.  Right and left cardiac catheterization in June 2019 showed  showed mild nonobstructive coronary artery disease.  Ejection fraction was 50 to 55% with severe mitral regurgitation.  Right heart catheterization showed severely elevated filling pressures with moderate to severe pulmonary hypertension and severely reduced cardiac output. I transferred the patient to [facility] where she was effectively diuresed with milrinone with subsequent improvement in symptoms.  She had a transesophageal echocardiogram done which showed normal LV systolic function with only mild mitral regurgitation.  The mitral regurgitation was felt to be functional with subsequent improvement after diuresis. Atrial fibrillation is currently being treated with rate control due to symptomatic maintaining sinus rhythm.    Atrial fibrillation has been treated with rate control as we  could not keep her in sinus rhythm even antiarrhythmic medication.  Most recent echocardiogram in December 2021 showed normal LV systolic function, severe pulmonary hypertension and moderate mitral regurgitation.  She reports significant exertional palpitations and tachycardia with worsening shortness of breath when her heart rate is high.  No chest pain.  Her heart rate goes above 100 easily with any physical exertion.   Past Medical History:  Diagnosis Date   (HFpEF) heart failure with preserved ejection fraction ([place])    a. 07/2017 Echo: EF 50-55%, no rwma, mild AS/AI, mod MR, mod dil LA, mod TR, PASP 32mmHg.   Autoimmune hepatitis ([place])    followed by Dr [name]   Fibrocystic breast disease    Glaucoma    Hypercholesterolemia    Hypertension    Hypothyroidism    Inflammatory arthritis    MI (myocardial infarction) ([place])    Neuropathy    Non-obstructive Coronary artery disease    a. 05/2014 NSTEMI/Cath: LM nl, LAD mild dzs, LCX 60ost hazy (? culprit), RCA mild dzs. EF 60% by echo-->Med Rx; b. 08/2017 Cath: LM 30ost, LAD min irregs, LCX small, 40ost/p, RCA large, min irregs, RPDA/RPL1 nl, EF 50-55%. 4+MR.   Osteoarthritis    Permanent atrial fibrillation (HCC)    a. CHA2DS2VASc = 6-->Xarelto.   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 07/2017 Echo: Mod MR; b. 08/2017 Cath: 4+/Severe MR.    Past Surgical History:  Procedure Laterality Date   ABDOMINAL HYSTERECTOMY  1963   partial, secondary to fibroids   APPENDECTOMY     Billroth     CARDIAC CATHETERIZATION  05/12/14  [place] N/A 12/26/2016   Procedure: CARDIOVERSION;  Surgeon: [name], MD;  Location: [facility];  Service: Cardiovascular;  Laterality: N/A;   CARDIOVERSION N/A 05/16/2017   Procedure: CARDIOVERSION;  Surgeon: [name], MD;  Location: [facility];  Service: Cardiovascular;  Laterality: N/A;    CARDIOVERSION N/A 06/09/2017   Procedure: CARDIOVERSION;  Surgeon: [name], MD;  Location: [facility];  Service: Cardiovascular;  Laterality: N/A;   CARDIOVERSION N/A 11/30/2018   Procedure: CARDIOVERSION;  Surgeon: [name], MD;  Location: [facility];  Service: Cardiovascular;  Laterality: N/A;   CHOLECYSTECTOMY  1998   COLONOSCOPY  2009   RIGHT/LEFT HEART CATH AND CORONARY ANGIOGRAPHY Bilateral 08/21/2017   Procedure: RIGHT/LEFT HEART CATH AND CORONARY ANGIOGRAPHY;  Surgeon: [name], MD;  Location: [place] [facility];  Service: Cardiovascular;  Laterality: Bilateral;   TEE WITHOUT CARDIOVERSION N/A 08/25/2017   Procedure: TRANSESOPHAGEAL ECHOCARDIOGRAM (TEE);  Surgeon: Jolaine Artist, MD;  Location: [facility];  Service: Cardiovascular;  Laterality: N/A;   UPPER GI ENDOSCOPY  2004     Current Outpatient Medications  Medication Sig Dispense Refill   allopurinol (ZYLOPRIM) 100 MG tablet Take 0.5 tablets (50 mg total) by mouth daily. 45 tablet 1   colchicine 0.6 MG tablet Take 1 tablet (0.6 mg total) by mouth daily as needed. 10 tablet 2   CYANOCOBALAMIN PO Take 500 mcg by mouth once a week.     dorzolamide (TRUSOPT) 2 % ophthalmic solution Place 1 drop into the left eye 2 (two) times daily.     fluticasone (FLONASE) 50 MCG/ACT nasal spray Place 2 sprays into both nostrils daily as needed for allergies.      gabapentin (NEURONTIN) 300 MG capsule One capsule tid and two capsules q hs. 150 capsule 2   ipratropium (ATROVENT) 0.03 % nasal spray SMARTSIG:2 Spray(s) Both Nares 3 Times Daily PRN     latanoprost (XALATAN) 0.005 % ophthalmic solution Place 1 drop into both eyes at bedtime.      levothyroxine (SYNTHROID) 112 MCG tablet TAKE 1 TABLET(112 MCG) BY MOUTH DAILY BEFORE BREAKFAST 90 tablet 1   metoprolol tartrate (LOPRESSOR) 100 MG tablet Take 1 tablet (100 mg total) by mouth 2 (two) times daily. 180 tablet 2   Multiple Vitamin (MULTI-VITAMIN) tablet Take 1  tablet by mouth at bedtime.      pantoprazole (PROTONIX) 20 MG tablet TAKE 1 TABLET BY MOUTH EVERY DAY 90 tablet 1   torsemide (DEMADEX) 20 MG tablet ALTERNATE 2 TABLETS BY MOUTH ONE DAY AND 1 TABLET THE NEXT DAY. 144 tablet 0   traMADol (ULTRAM) 50 MG tablet Take 1 tablet (50 mg total) by mouth 2 (two) times daily as needed (for pain.). 60 tablet 0   ursodiol (ACTIGALL) 300 MG capsule Take 300-600 mg by mouth See admin instructions. Take 2 capsules (600 mg) daily in the morning   & 1 capsule (300 mg) at night.     XARELTO 15 MG TABS tablet TAKE 1 TABLET(15 MG) BY MOUTH DAILY WITH SUPPER 90 tablet 1   No current facility-administered medications for this visit.    Allergies:   Statins, Haldol [haloperidol lactate], Librax [chlordiazepoxide-clidinium], Plavix [clopidogrel bisulfate], and Ramipril    Social History:  The patient  reports that she has never smoked. She has never used smokeless tobacco. She reports that she does not drink alcohol and does not use drugs.   Family History:  The patient's family history includes Colon cancer in  her daughter; Esophageal cancer in her brother; Ovarian cancer in her daughter; Stroke in her mother.    ROS:  Please see the history of present illness.   Otherwise, review of systems are positive for none.   All other systems are reviewed and negative.    PHYSICAL EXAM: VS:  BP (!) 148/78 (BP Location: Left Arm, Patient Position: Sitting, Cuff Size: Normal)   Pulse 92   Ht 5\' 2"  (1.575 m)   Wt 136 lb 6 oz (61.9 kg)   SpO2 96%   BMI 24.94 kg/m  , BMI Body mass index is 24.94 kg/m. GEN: Well nourished, well developed, in no acute distress  HEENT: normal  Neck: No JVD, carotid bruits, or masses Cardiac: Irregularly irregular and mildly tachycardic; no rubs, or gallops.  1/ 6 systolic ejection murmur at the aortic area.  Mild bilateral leg edema Respiratory: Clear to auscultation GI: soft, nontender, nondistended, + BS MS: no deformity or atrophy   Skin: warm and dry, no rash Neuro:  Strength and sensation are intact Psych: euthymic mood, full affect   EKG:  EKG is ordered today. The ekg ordered today demonstrates atrial fibrillation with ventricular rate of 92   Recent Labs: 04/05/2020: B Natriuretic Peptide 754.0 06/22/2020: ALT 13; TSH 2.12 09/12/2020: Hemoglobin 13.4; Platelets 187 09/19/2020: BUN 33; Creatinine, Ser 1.29; Potassium 5.0; Sodium 140    Lipid Panel    Component Value Date/Time   CHOL 143 06/22/2020 1018   TRIG 139.0 06/22/2020 1018   HDL 48.40 06/22/2020 1018   CHOLHDL 3 06/22/2020 1018   VLDL 27.8 06/22/2020 1018   LDLCALC 67 06/22/2020 1018   LDLDIRECT 143.5 12/17/2012 0955      Wt Readings from Last 3 Encounters:  01/04/21 136 lb 6 oz (61.9 kg)  11/16/20 137 lb 9.6 oz (62.4 kg)  09/12/20 136 lb (61.7 kg)       ASSESSMENT AND PLAN:  1.    Permanent atrial fibrillation: She reports significant exertional palpitations with worsening shortness of breath.  Ventricular rate does not seem to be optimally controlled.  I elected to increase metoprolol to 150 mg twice daily.    Continue anticoagulation with Xarelto 15 mg daily given underlying chronic kidney disease.  2.  Chronic diastolic heart failure: She appears to be euvolemic on current dose of torsemide.   3. Essential hypertension: Blood pressure is mildly elevated and should improve with increasing metoprolol.   4.  Functional mitral regurgitation: This was moderate on recent echocardiogram.  5.  Coronary artery disease involving native coronary arteries without angina: Continue medical therapy.   Disposition:   FU with me in 2 months.  Signed,  [name], MD  01/04/2021 1:33 PM    [facility]

## 2021-01-04 NOTE — Patient Instructions (Signed)
Medication Instructions:  Your physician has recommended you make the following change in your medication:   INCREASE Metoprolol tartrate 100 mg and take 1 & 1/2 tablet (one and one half tablet 150 mg) twice a day  *If you need a refill on your cardiac medications before your next appointment, please call your pharmacy*   Lab Work: None  If you have labs (blood work) drawn today and your tests are completely normal, you will receive your results only by: [place] (if you have MyChart) OR A paper copy in the mail If you have any lab test that is abnormal or we need to change your treatment, we will call you to review the results.   Testing/Procedures: None   Follow-Up: At [facility], you and your health needs are our priority.  As part of our continuing mission to provide you with exceptional heart care, we have created designated Provider Care Teams.  These Care Teams include your primary Cardiologist (physician) and Advanced Practice Providers (APPs -  Physician Assistants and Nurse Practitioners) who all work together to provide you with the care you need, when you need it.   Your next appointment:   2 month(s)  The format for your next appointment:   In Person  Provider:   You may see [name], MD or one of the following Advanced Practice Providers on your designated Care Team:   Murray Hodgkins, NP Christell Faith, PA-C Marrianne Mood, PA-C Cadence [place], [place]

## 2021-01-11 NOTE — Addendum Note (Signed)
Addended by: Britt Bottom on: 01/11/2021 07:59 AM   Modules accepted: Orders

## 2021-01-31 DIAGNOSIS — I10 Essential (primary) hypertension: Secondary | ICD-10-CM | POA: Diagnosis not present

## 2021-01-31 DIAGNOSIS — N1832 Chronic kidney disease, stage 3b: Secondary | ICD-10-CM | POA: Diagnosis not present

## 2021-01-31 DIAGNOSIS — N2581 Secondary hyperparathyroidism of renal origin: Secondary | ICD-10-CM | POA: Diagnosis not present

## 2021-02-15 DIAGNOSIS — Z20822 Contact with and (suspected) exposure to covid-19: Secondary | ICD-10-CM | POA: Diagnosis not present

## 2021-02-16 ENCOUNTER — Other Ambulatory Visit: Payer: Self-pay

## 2021-02-16 ENCOUNTER — Other Ambulatory Visit: Payer: Self-pay | Admitting: Internal Medicine

## 2021-02-16 ENCOUNTER — Ambulatory Visit (INDEPENDENT_AMBULATORY_CARE_PROVIDER_SITE_OTHER): Payer: Medicare Other | Admitting: Internal Medicine

## 2021-02-16 VITALS — BP 117/60 | HR 129 | Temp 97.6°F | Resp 16 | Ht 62.0 in | Wt 137.5 lb

## 2021-02-16 DIAGNOSIS — K754 Autoimmune hepatitis: Secondary | ICD-10-CM | POA: Diagnosis not present

## 2021-02-16 DIAGNOSIS — E7800 Pure hypercholesterolemia, unspecified: Secondary | ICD-10-CM

## 2021-02-16 DIAGNOSIS — M810 Age-related osteoporosis without current pathological fracture: Secondary | ICD-10-CM

## 2021-02-16 DIAGNOSIS — R0602 Shortness of breath: Secondary | ICD-10-CM

## 2021-02-16 DIAGNOSIS — I5032 Chronic diastolic (congestive) heart failure: Secondary | ICD-10-CM | POA: Diagnosis not present

## 2021-02-16 DIAGNOSIS — D500 Iron deficiency anemia secondary to blood loss (chronic): Secondary | ICD-10-CM

## 2021-02-16 DIAGNOSIS — Z23 Encounter for immunization: Secondary | ICD-10-CM

## 2021-02-16 DIAGNOSIS — D329 Benign neoplasm of meninges, unspecified: Secondary | ICD-10-CM

## 2021-02-16 DIAGNOSIS — I2510 Atherosclerotic heart disease of native coronary artery without angina pectoris: Secondary | ICD-10-CM

## 2021-02-16 DIAGNOSIS — E039 Hypothyroidism, unspecified: Secondary | ICD-10-CM

## 2021-02-16 DIAGNOSIS — I10 Essential (primary) hypertension: Secondary | ICD-10-CM | POA: Diagnosis not present

## 2021-02-16 DIAGNOSIS — K219 Gastro-esophageal reflux disease without esophagitis: Secondary | ICD-10-CM | POA: Diagnosis not present

## 2021-02-16 DIAGNOSIS — I340 Nonrheumatic mitral (valve) insufficiency: Secondary | ICD-10-CM

## 2021-02-16 DIAGNOSIS — G6289 Other specified polyneuropathies: Secondary | ICD-10-CM

## 2021-02-16 DIAGNOSIS — N1832 Chronic kidney disease, stage 3b: Secondary | ICD-10-CM

## 2021-02-16 DIAGNOSIS — K743 Primary biliary cirrhosis: Secondary | ICD-10-CM | POA: Diagnosis not present

## 2021-02-16 DIAGNOSIS — I4819 Other persistent atrial fibrillation: Secondary | ICD-10-CM | POA: Diagnosis not present

## 2021-02-16 DIAGNOSIS — I272 Pulmonary hypertension, unspecified: Secondary | ICD-10-CM | POA: Diagnosis not present

## 2021-02-16 MED ORDER — DILTIAZEM HCL ER COATED BEADS 120 MG PO CP24: 120.0000 mg | ORAL_CAPSULE | Freq: Every day | ORAL | 1 refills | Status: DC

## 2021-02-16 MED ORDER — TRAMADOL HCL 50 MG PO TABS: 50.0000 mg | ORAL_TABLET | Freq: Two times a day (BID) | ORAL | 0 refills | Status: DC | PRN

## 2021-02-16 NOTE — Progress Notes (Signed)
[name], female   DOB: 08/20/1928, 87 y.o.   MRN: [id number]   Subjective:    [name], female    DOB: 09-25-1928, 87 y.o.   MRN: [id number]  This visit occurred during the SARS-CoV-2 public health emergency.  Safety protocols were in place, including screening questions prior to the visit, additional usage of staff PPE, and extensive cleaning of exam room while observing appropriate contact time as indicated for disinfecting solutions.   Patient here for a scheduled follow up.   Chief Complaint  Patient presents with   Follow-up   Atrial Fibrillation    .   HPI Was seen 01/04/21 - Dr Fletcher Anon.  Reported exertional palpitations and tachycardia with sob at that visit.  Metoprolol increased to 150mg  bid.  Continues on xarelto.  Since increasing metoprolol, symptoms have improved some.  Still notices sob with exertion, but feels breathing is stable overall.  No chest pain.  No increased cough or congestion.  No acid reflux reported.  No abdominal pain or bowel change reported.  Saw GI.  Recommended continuing ursodiol.  Recent ultrasound ok.  Still with increased pain - knee and low bck.  Request refill tramadol.     Past Medical History:  Diagnosis Date   (HFpEF) heart failure with preserved ejection fraction ([place])    a. 07/2017 Echo: EF 50-55%, no rwma, mild AS/AI, mod MR, mod dil LA, mod TR, PASP 17mmHg.   Autoimmune hepatitis ([place])    followed by Dr [name]   Fibrocystic breast disease    Glaucoma    Hypercholesterolemia    Hypertension    Hypothyroidism    Inflammatory arthritis    MI (myocardial infarction) ([place])    Neuropathy    Non-obstructive Coronary artery disease    a. 05/2014 NSTEMI/Cath: LM nl, LAD mild dzs, LCX 60ost hazy (? culprit), RCA mild dzs. EF 60% by echo-->Med Rx; b. 08/2017 Cath: LM 30ost, LAD min irregs, LCX small, 40ost/p, RCA large, min irregs, RPDA/RPL1 nl, EF 50-55%. 4+MR.   Osteoarthritis    Permanent atrial fibrillation (HCC)     a. CHA2DS2VASc = 6-->Xarelto.   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 07/2017 Echo: Mod MR; b. 08/2017 Cath: 4+/Severe MR.   Past Surgical History:  Procedure Laterality Date   ABDOMINAL HYSTERECTOMY  1963   partial, secondary to fibroids   APPENDECTOMY     [place]     CARDIAC CATHETERIZATION  05/12/14   [place]   CARDIOVERSION N/A 12/26/2016   Procedure: CARDIOVERSION;  Surgeon: [name], MD;  Location: [facility] ORS;  Service: Cardiovascular;  Laterality: N/A;   CARDIOVERSION N/A 05/16/2017   Procedure: CARDIOVERSION;  Surgeon: [name], MD;  Location: [facility] ORS;  Service: Cardiovascular;  Laterality: N/A;   CARDIOVERSION N/A 06/09/2017   Procedure: CARDIOVERSION;  Surgeon: [name], MD;  Location: [facility] ORS;  Service: Cardiovascular;  Laterality: N/A;   CARDIOVERSION N/A 11/30/2018   Procedure: CARDIOVERSION;  Surgeon: [name], MD;  Location: [facility] ORS;  Service: Cardiovascular;  Laterality: N/A;   CHOLECYSTECTOMY  1998   COLONOSCOPY  2009   RIGHT/LEFT HEART CATH AND CORONARY ANGIOGRAPHY Bilateral 08/21/2017   Procedure: RIGHT/LEFT HEART CATH AND CORONARY ANGIOGRAPHY;  Surgeon: [name], MD;  Location: [facility];  Service: Cardiovascular;  Laterality: Bilateral;   TEE WITHOUT CARDIOVERSION N/A 08/25/2017   Procedure:  TRANSESOPHAGEAL ECHOCARDIOGRAM (TEE);  Surgeon: Jolaine Artist, MD;  Location: [facility] ENDOSCOPY;  Service: Cardiovascular;  Laterality: N/A;   UPPER GI ENDOSCOPY  2004   Family History  Problem Relation Age of Onset   Stroke Mother    Esophageal cancer Brother        also had lung cancer   Ovarian cancer Daughter    Colon cancer Daughter    Breast cancer Neg Hx    Social History   Socioeconomic History   Marital status: Widowed    Spouse name: Not on file   Number of children: 2   Years of education: Not on file   Highest  education level: Not on file  Occupational History   Occupation: retired  Tobacco Use   Smoking status: Never   Smokeless tobacco: Never  Vaping Use   Vaping Use: Never used  Substance and Sexual Activity   Alcohol use: No    Alcohol/week: 0.0 standard drinks   Drug use: No   Sexual activity: Never  Other Topics Concern   Not on file  Social History Narrative   Not on file   Social Determinants of Health   Financial Resource Strain: Low Risk    Difficulty of Paying Living Expenses: Not hard at all  Food Insecurity: No Food Insecurity   Worried About Charity fundraiser in the Last Year: Never true   [place] in the Last Year: Never true  Transportation Needs: No Transportation Needs   Lack of Transportation (Medical): No   Lack of Transportation (Non-Medical): No  Physical Activity: Insufficiently Active   Days of Exercise per Week: 3 days   Minutes of Exercise per Session: 40 min  Stress: No Stress Concern Present   Feeling of Stress : Not at all  Social Connections: Unknown   Frequency of Communication with Friends and Family: More than three times a week   Frequency of Social Gatherings with Friends and Family: More than three times a week   Attends Religious Services: Not on Electrical engineer or Organizations: Not on file   Attends Archivist Meetings: Not on file   Marital Status: Not on file     Review of Systems  Constitutional:  Negative for appetite change and unexpected weight change.  HENT:  Negative for congestion and sinus pressure.   Respiratory:  Negative for cough and chest tightness.        Breathing stable.   Cardiovascular:  Negative for chest pain and leg swelling.       Still with intermittent notice of palpitations.  Overall stable  - maybe improved some.    Gastrointestinal:  Negative for abdominal pain, diarrhea, nausea and vomiting.  Genitourinary:  Negative for difficulty urinating and dysuria.   Musculoskeletal:  Negative for joint swelling and myalgias.  Skin:  Negative for color change and rash.  Neurological:  Negative for dizziness, light-headedness and headaches.  Psychiatric/Behavioral:  Negative for agitation and dysphoric mood.       Objective:     BP 117/60   Pulse (!) 129   Temp 97.6 F (36.4 C) (Oral)   Resp 16   Ht 5\' 2"  (1.575 m)   Wt 137 lb 8 oz (62.4 kg)   SpO2 94%   BMI 25.15 kg/m  Wt Readings from Last 3 Encounters:  02/16/21 137 lb 8 oz (62.4 kg)  01/04/21 136 lb 6 oz (61.9 kg)  11/16/20 137 lb 9.6 oz (  62.4 kg)    Physical Exam Vitals reviewed.  Constitutional:      General: She is not in acute distress.    Appearance: Normal appearance.  HENT:     Head: Normocephalic and atraumatic.     Right Ear: External ear normal.     Left Ear: External ear normal.  Eyes:     General: No scleral icterus.       Right eye: No discharge.        Left eye: No discharge.     Conjunctiva/sclera: Conjunctivae normal.  Neck:     Thyroid: No thyromegaly.  Cardiovascular:     Rate and Rhythm: Normal rate.     Comments: Irregular rhythm - rate 120.  Pulmonary:     Effort: No respiratory distress.     Breath sounds: Normal breath sounds. No wheezing.  Abdominal:     General: Bowel sounds are normal.     Palpations: Abdomen is soft.     Tenderness: There is no abdominal tenderness.  Musculoskeletal:        General: No swelling or tenderness.     Cervical back: Neck supple. No tenderness.  Lymphadenopathy:     Cervical: No cervical adenopathy.  Skin:    Findings: No erythema or rash.  Neurological:     Mental Status: She is alert.  Psychiatric:        Mood and Affect: Mood normal.        Behavior: Behavior normal.     Outpatient Encounter Medications as of 02/16/2021  Medication Sig   allopurinol (ZYLOPRIM) 100 MG tablet Take 0.5 tablets (50 mg total) by mouth daily.   colchicine 0.6 MG tablet Take 1 tablet (0.6 mg total) by mouth daily as needed.    CYANOCOBALAMIN PO Take 500 mcg by mouth once a week.   dorzolamide (TRUSOPT) 2 % ophthalmic solution Place 1 drop into the left eye 2 (two) times daily.   fluticasone (FLONASE) 50 MCG/ACT nasal spray Place 2 sprays into both nostrils daily as needed for allergies.    gabapentin (NEURONTIN) 300 MG capsule One capsule tid and two capsules q hs.   ipratropium (ATROVENT) 0.03 % nasal spray SMARTSIG:2 Spray(s) Both Nares 3 Times Daily PRN   latanoprost (XALATAN) 0.005 % ophthalmic solution Place 1 drop into both eyes at bedtime.    levothyroxine (SYNTHROID) 112 MCG tablet TAKE 1 TABLET(112 MCG) BY MOUTH DAILY BEFORE BREAKFAST   metoprolol tartrate (LOPRESSOR) 100 MG tablet Take 1.5 tablets (150 mg total) by mouth 2 (two) times daily.   Multiple Vitamin (MULTI-VITAMIN) tablet Take 1 tablet by mouth at bedtime.    pantoprazole (PROTONIX) 20 MG tablet TAKE 1 TABLET BY MOUTH EVERY DAY   torsemide (DEMADEX) 20 MG tablet ALTERNATE 2 TABLETS BY MOUTH ONE DAY AND 1 TABLET THE NEXT DAY.   ursodiol (ACTIGALL) 300 MG capsule Take 300-600 mg by mouth See admin instructions. Take 2 capsules (600 mg) daily in the morning   & 1 capsule (300 mg) at night.   XARELTO 15 MG TABS tablet TAKE 1 TABLET(15 MG) BY MOUTH DAILY WITH SUPPER   [DISCONTINUED] diltiazem (CARDIZEM CD) 120 MG 24 hr capsule Take 1 capsule (120 mg total) by mouth daily.   [DISCONTINUED] traMADol (ULTRAM) 50 MG tablet Take 1 tablet (50 mg total) by mouth 2 (two) times daily as needed (for pain.).   traMADol (ULTRAM) 50 MG tablet Take 1 tablet (50 mg total) by mouth 2 (two) times daily as needed (for pain.).  No facility-administered encounter medications on file as of 02/16/2021.     Lab Results  Component Value Date   WBC 5.5 09/12/2020   HGB 13.4 09/12/2020   HCT 42.4 09/12/2020   PLT 187 09/12/2020   GLUCOSE 93 09/19/2020   CHOL 143 06/22/2020   TRIG 139.0 06/22/2020   HDL 48.40 06/22/2020   LDLDIRECT 143.5 12/17/2012   LDLCALC 67  06/22/2020   ALT 13 06/22/2020   AST 11 06/22/2020   NA 140 09/19/2020   K 5.0 09/19/2020   CL 102 09/19/2020   CREATININE 1.29 (H) 09/19/2020   BUN 33 09/19/2020   CO2 23 09/19/2020   TSH 2.12 06/22/2020   INR 1.63 12/29/2017   HGBA1C 5.6 01/27/2018    US Abdomen Limited RUQ (LIVER/GB)  Result Date: 01/05/2021 CLINICAL DATA:  Cirrhosis. EXAM: ULTRASOUND ABDOMEN LIMITED RIGHT UPPER QUADRANT COMPARISON:  Right upper quadrant ultrasound dated 01/03/2020. FINDINGS: Gallbladder: Cholecystectomy. Common bile duct: Diameter: 15 mm Liver: The liver demonstrates a coarsened echotexture with irregular contour in keeping with cirrhosis. No discrete mass. Portal vein is patent on color Doppler imaging with normal direction of blood flow towards the liver. Other: There is a 4.4 cm right renal upper pole cyst. A 3 mm nonobstructing stone versus vascular calcification in the upper pole of the right kidney. IMPRESSION: 1. Cirrhosis. 2. Patent main portal vein with hepatopetal flow. 3. No ascites. 4. Right renal cyst. Electronically Signed   By: Anner Crete M.D.   On: 01/05/2021 00:33       Assessment & Plan:   Problem List Items Addressed This Visit     Autoimmune hepatitis ([place])    Follow liver function tests.  Followed by GI.       Benign hypertension    Continue losartan and metoprolol.  Blood pressure controlled.  Adding cardizem to help with rate control. Follow pressure.  Follow metabolic panel.       Benign meningioma ([place])    Evaluated by Dr Jorene Minors (oncology [facility]).  States was told no further w/up or f/u neded.  Of note, no mention on MRI 2018.       Chronic diastolic heart failure (HCC)    Continue torsemide and metoprolol.  Continue xarelto.  No evidence of volume overload on exam.  Breathing stable.       CKD (chronic kidney disease) stage 3, GFR 30-59 ml/min (HCC)    Avoid antiinflammatories.  Follow metabolic panel.       GERD (gastroesophageal reflux disease)     Upper symptoms controlled.  On protonix.       Hypercholesteremia    Unable to take statin medication.  Follow lipid panel.       Hypothyroidism    On thyroid replacement.  Follow tsh.       Iron deficiency anemia    Has seen hematology.  Follow cbc and iron studies.       Osteoporosis    prolia - last 10/11/20.       Persistent atrial fibrillation (HCC) - Primary    afib with RVR.  Increased rate today on exam.  On metoprolol 150mg  bid.  Discussed with cardiology.  Add cardizem 120mg  q day.  Follow.  Prior to leaving, ventricular rate 110.  Arrange earlier f/u with cardiology. Continue xarelto.       Relevant Orders   EKG 12-Lead (Completed)   Primary biliary cholangitis ([place])    Followed by GI. Stable. Follow liver function tests.  Pulmonary hypertension, unspecified ([place])    Followed by cardiology.      Severe mitral regurgitation    No evidence of volume overload.  Continue torsemide.       SOB (shortness of breath) on exertion    Taking torsemide.  On metoprolol.  Add cardizem for better rate control.  No evidence of volume overload.  Follow.        Peripheral neuropathy (Chronic)    On gabapentin.       Other Visit Diagnoses     Need for immunization against influenza       Relevant Orders   Flu Vaccine QUAD High Dose(Fluad) (Completed)        Einar Pheasant, MD

## 2021-02-17 ENCOUNTER — Encounter: Payer: Self-pay | Admitting: Internal Medicine

## 2021-02-17 NOTE — Assessment & Plan Note (Signed)
Follow liver function tests.  Followed by GI.  

## 2021-02-17 NOTE — Assessment & Plan Note (Signed)
Followed by cardiology 

## 2021-02-17 NOTE — Assessment & Plan Note (Signed)
Continue torsemide and metoprolol.  Continue xarelto.  No evidence of volume overload on exam.  Breathing stable.

## 2021-02-17 NOTE — Assessment & Plan Note (Signed)
Followed by GI.  Stable.  Follow liver function tests.  

## 2021-02-17 NOTE — Assessment & Plan Note (Signed)
Avoid antiinflammatories.  Follow metabolic panel.  

## 2021-02-17 NOTE — Assessment & Plan Note (Signed)
Unable to take statin medication.  Follow lipid panel.  

## 2021-02-17 NOTE — Assessment & Plan Note (Signed)
Evaluated by Dr Allen Friedman (oncology [facility]).  States was told no further w/up or f/u neded.  Of note, no mention on MRI 2018.  

## 2021-02-17 NOTE — Assessment & Plan Note (Signed)
Upper symptoms controlled.  On protonix.  

## 2021-02-17 NOTE — Assessment & Plan Note (Signed)
On thyroid replacement.  Follow tsh.  

## 2021-02-17 NOTE — Assessment & Plan Note (Signed)
Continue losartan and metoprolol.  Blood pressure controlled.  Adding cardizem to help with rate control. Follow pressure.  Follow metabolic panel.

## 2021-02-17 NOTE — Assessment & Plan Note (Signed)
prolia - last 10/11/20.

## 2021-02-17 NOTE — Assessment & Plan Note (Signed)
No evidence of volume overload.  Continue torsemide.

## 2021-02-17 NOTE — Assessment & Plan Note (Signed)
On gabapentin.  

## 2021-02-17 NOTE — Assessment & Plan Note (Addendum)
afib with RVR.  Increased rate today on exam.  On metoprolol 150mg  bid.  Discussed with cardiology.  Add cardizem 120mg  q day.  Follow.  Prior to leaving, ventricular rate 110.  Arrange earlier f/u with cardiology. Continue xarelto.

## 2021-02-17 NOTE — Assessment & Plan Note (Signed)
Taking torsemide.  On metoprolol.  Add cardizem for better rate control.  No evidence of volume overload.  Follow.

## 2021-02-17 NOTE — Assessment & Plan Note (Signed)
Has seen hematology.  Follow cbc and iron studies.  

## 2021-02-21 ENCOUNTER — Telehealth: Payer: Self-pay | Admitting: Internal Medicine

## 2021-02-21 ENCOUNTER — Telehealth: Payer: Self-pay | Admitting: Cardiovascular Disease

## 2021-02-21 NOTE — Telephone Encounter (Signed)
Per dr. Nicki Reaper discussed case with Dr. Fletcher Anon and fu needed   Added to 12/22 at 320 pm

## 2021-02-21 NOTE — Telephone Encounter (Signed)
Agree that she needs to be seen.  This sounds like shingles instead of a drug rash - (isolated to one area and painful).  Needs to be seen asap so can determine etiology of rash.  Larena Glassman, please call her and see if she will be agreeable to be seen.

## 2021-02-21 NOTE — Telephone Encounter (Signed)
Results have been relayed to the patient. The patient verbalized understanding. She states that the new mediation she is on has given her a rash on her left breast. It starts at her side and runs to nipple. Only place it is located. It is is itchy and painful.  I suggest she been seen in case shingles she states that it is not shingles but wants to know what she should do about the medicine since it is causing this. She is using benadryl. No SOB, no rash anywhere else.

## 2021-02-21 NOTE — Telephone Encounter (Signed)
Patient scheduled with padonda to evaluate rash

## 2021-02-21 NOTE — Telephone Encounter (Signed)
I saw Ruth Roth last Friday.  She was in afib with RVR.  She sees Dr Fletcher Anon.  She is on metoprolol and we added cardizem.  Pt aware  that I talked with Dr Fletcher Anon and was to call her with a f/u appt with him.  Please notify her that her f/u with him is scheduled for 03/01/21 - 3:20.

## 2021-02-22 ENCOUNTER — Ambulatory Visit: Payer: Medicare Other | Admitting: Family

## 2021-02-28 DIAGNOSIS — N1832 Chronic kidney disease, stage 3b: Secondary | ICD-10-CM | POA: Diagnosis not present

## 2021-03-01 ENCOUNTER — Ambulatory Visit (INDEPENDENT_AMBULATORY_CARE_PROVIDER_SITE_OTHER): Payer: Medicare Other | Admitting: Cardiovascular Disease

## 2021-03-01 ENCOUNTER — Encounter: Payer: Self-pay | Admitting: Cardiovascular Disease

## 2021-03-01 ENCOUNTER — Other Ambulatory Visit: Payer: Self-pay

## 2021-03-01 VITALS — BP 138/71 | HR 86 | Ht 62.0 in | Wt 139.1 lb

## 2021-03-01 DIAGNOSIS — I4821 Permanent atrial fibrillation: Secondary | ICD-10-CM

## 2021-03-01 DIAGNOSIS — I10 Essential (primary) hypertension: Secondary | ICD-10-CM

## 2021-03-01 DIAGNOSIS — I5032 Chronic diastolic (congestive) heart failure: Secondary | ICD-10-CM

## 2021-03-01 DIAGNOSIS — I340 Nonrheumatic mitral (valve) insufficiency: Secondary | ICD-10-CM | POA: Diagnosis not present

## 2021-03-01 DIAGNOSIS — I2510 Atherosclerotic heart disease of native coronary artery without angina pectoris: Secondary | ICD-10-CM | POA: Diagnosis not present

## 2021-03-01 NOTE — Patient Instructions (Signed)
Medication Instructions:  °Your physician recommends that you continue on your current medications as directed. Please refer to the Current Medication list given to you today. ° °*If you need a refill on your cardiac medications before your next appointment, please call your pharmacy* ° ° °Lab Work: °None ordered °If you have labs (blood work) drawn today and your tests are completely normal, you will receive your results only by: °MyChart Message (if you have MyChart) OR °A paper copy in the mail °If you have any lab test that is abnormal or we need to change your treatment, we will call you to review the results. ° ° °Testing/Procedures: °None ordered ° ° °Follow-Up: °At [facility], you and your health needs are our priority.  As part of our continuing mission to provide you with exceptional heart care, we have created designated Provider Care Teams.  These Care Teams include your primary Cardiologist (physician) and Advanced Practice Providers (APPs -  Physician Assistants and Nurse Practitioners) who all work together to provide you with the care you need, when you need it. ° °We recommend signing up for the patient portal called "MyChart".  Sign up information is provided on this After Visit Summary.  MyChart is used to connect with patients for Virtual Visits (Telemedicine).  Patients are able to view lab/test results, encounter notes, upcoming appointments, etc.  Non-urgent messages can be sent to your provider as well.   °To learn more about what you can do with MyChart, go to [email or web address]://[email or web address].com.   ° °Your next appointment:   °4 month(s) ° °The format for your next appointment:   °In Person ° °Provider:   °You may see Muhammad Arida, MD or one of the following Advanced Practice Providers on your designated Care Team:   °Christopher Berge, NP °Ryan Dunn, PA-C °Cadence Furth, PA-C{ ° ° ° ° °Other Instructions °N/A ° °

## 2021-03-01 NOTE — Progress Notes (Signed)
Cardiology Office Note   Date:  03/01/2021   ID:  Ruth Roth, DOB May 02, 1928, MRN [id number]  PCP:  Einar Pheasant, MD  Cardiologist:   [name], MD   Chief Complaint  Patient presents with   Other    2 month f/u no complaints today. Meds reviewed verbally with pt.       History of Present Illness: Ruth Roth is a 87 y.o. female who presents for a follow-up visit regarding coronary artery disease, chronic diastolic heart failure, mitral regurgitation and persistent atrial fibrillation .  She has known history of hypertension, chronic kidney disease, peptic ulcer disease, fibrocystic breast disease, inflammatory arthritis, autoimmune hepatitis with cirrhosis and neuropathy.  She had a small non-ST elevation myocardial infarction in March 2016 with acute diastolic heart failure after a GI illness.  Echocardiogram showed normal LV systolic function. Cardiac catheterization showed showed 60% ostial left circumflex hazy stenosis which was possibly the culprit. Mild LAD/RCA disease. She was treated medically.    She had worsening heart failure 2019.  Right and left cardiac catheterization in June 2019 showed  showed mild nonobstructive coronary artery disease.  Ejection fraction was 50 to 55% with severe mitral regurgitation.  Right heart catheterization showed severely elevated filling pressures with moderate to severe pulmonary hypertension and severely reduced cardiac output. I transferred the patient to [facility] where she was effectively diuresed with milrinone with subsequent improvement in symptoms.  She had a transesophageal echocardiogram done which showed normal LV systolic function with only mild mitral regurgitation.  The mitral regurgitation was felt to be functional with subsequent improvement after diuresis. Atrial fibrillation is currently being treated with rate control due to symptomatic maintaining sinus rhythm.    Atrial fibrillation has been treated with rate  control as we could not keep her in sinus rhythm even antiarrhythmic medication.  Most recent echocardiogram in December 2021 showed normal LV systolic function, severe pulmonary hypertension and moderate mitral regurgitation.  The patient has difficult to control ventricular rate.  During her last visit with me, I increase metoprolol to 150 mg twice daily.  She was subsequently seen by Dr. Nicki Reaper and she was even more tachycardic with a heart rate of 129 bpm.  Thus, we elected to start on diltiazem extended release 120 mg once daily.  Since then, her ventricular rate has improved.  She did develop a rash after starting the medication but it seems to be improving.  Past Medical History:  Diagnosis Date   (HFpEF) heart failure with preserved ejection fraction ([place])    a. 07/2017 Echo: EF 50-55%, no rwma, mild AS/AI, mod MR, mod dil LA, mod TR, PASP 75mmHg.   Autoimmune hepatitis ([place])    followed by Dr [name]   Fibrocystic breast disease    Glaucoma    Hypercholesterolemia    Hypertension    Hypothyroidism    Inflammatory arthritis    MI (myocardial infarction) ([place])    Neuropathy    Non-obstructive Coronary artery disease    a. 05/2014 NSTEMI/Cath: LM nl, LAD mild dzs, LCX 60ost hazy (? culprit), RCA mild dzs. EF 60% by echo-->Med Rx; b. 08/2017 Cath: LM 30ost, LAD min irregs, LCX small, 40ost/p, RCA large, min irregs, RPDA/RPL1 nl, EF 50-55%. 4+MR.   Osteoarthritis    Permanent atrial fibrillation (HCC)    a. CHA2DS2VASc = 6-->Xarelto.   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 07/2017 Echo: Mod MR; b. 08/2017 Cath: 4+/Severe MR.    Past Surgical History:  Procedure Laterality Date   ABDOMINAL HYSTERECTOMY  1963   partial, secondary to fibroids   APPENDECTOMY     Billroth     CARDIAC CATHETERIZATION  05/12/14   [place]   CARDIOVERSION N/A 12/26/2016   Procedure: CARDIOVERSION;  Surgeon: [name], MD;  Location: [facility] ORS;  Service: Cardiovascular;  Laterality: N/A;   CARDIOVERSION N/A 05/16/2017   Procedure: CARDIOVERSION;  Surgeon: [name], MD;  Location: [facility] ORS;  Service: Cardiovascular;  Laterality: N/A;   CARDIOVERSION N/A 06/09/2017   Procedure: CARDIOVERSION;  Surgeon: [name], MD;  Location: [facility] ORS;  Service: Cardiovascular;  Laterality: N/A;   CARDIOVERSION N/A 11/30/2018   Procedure: CARDIOVERSION;  Surgeon: [name], MD;  Location: [facility] ORS;  Service: Cardiovascular;  Laterality: N/A;   CHOLECYSTECTOMY  1998   COLONOSCOPY  2009   RIGHT/LEFT HEART CATH AND CORONARY ANGIOGRAPHY Bilateral 08/21/2017   Procedure: RIGHT/LEFT HEART CATH AND CORONARY ANGIOGRAPHY;  Surgeon: [name], MD;  Location: [place] [facility];  Service: Cardiovascular;  Laterality: Bilateral;   TEE WITHOUT CARDIOVERSION N/A 08/25/2017   Procedure: TRANSESOPHAGEAL ECHOCARDIOGRAM (TEE);  Surgeon: Jolaine Artist, MD;  Location: [facility] ENDOSCOPY;  Service: Cardiovascular;  Laterality: N/A;   UPPER GI ENDOSCOPY  2004     Current Outpatient Medications  Medication Sig Dispense Refill   allopurinol (ZYLOPRIM) 100 MG tablet Take 0.5 tablets (50 mg total) by mouth daily. 45 tablet 1   colchicine 0.6 MG tablet Take 1 tablet (0.6 mg total) by mouth daily as needed. 10 tablet 2   CYANOCOBALAMIN PO Take 500 mcg by mouth once a week.     diltiazem (CARDIZEM CD) 120 MG 24 hr capsule TAKE 1 CAPSULE(120 MG) BY MOUTH DAILY 90 capsule 1   dorzolamide (TRUSOPT) 2 % ophthalmic solution Place 1 drop into the left eye 2 (two) times daily.     fluticasone (FLONASE) 50 MCG/ACT nasal spray Place 2 sprays into both nostrils daily as needed for allergies.      gabapentin (NEURONTIN) 300 MG capsule One capsule tid and two capsules q hs. 150 capsule 2   ipratropium (ATROVENT) 0.03 % nasal spray SMARTSIG:2 Spray(s) Both Nares 3 Times Daily PRN     latanoprost (XALATAN) 0.005 % ophthalmic  solution Place 1 drop into both eyes at bedtime.      levothyroxine (SYNTHROID) 112 MCG tablet TAKE 1 TABLET(112 MCG) BY MOUTH DAILY BEFORE BREAKFAST 90 tablet 1   metoprolol tartrate (LOPRESSOR) 100 MG tablet Take 1.5 tablets (150 mg total) by mouth 2 (two) times daily. 270 tablet 0   Multiple Vitamin (MULTI-VITAMIN) tablet Take 1 tablet by mouth at bedtime.      pantoprazole (PROTONIX) 20 MG tablet TAKE 1 TABLET BY MOUTH EVERY DAY 90 tablet 1   torsemide (DEMADEX) 20 MG tablet ALTERNATE 2 TABLETS BY MOUTH ONE DAY AND 1 TABLET THE NEXT DAY. 144 tablet 0   traMADol (ULTRAM) 50 MG tablet Take 1 tablet (50 mg total) by mouth 2 (two) times daily as needed (for pain.). 60 tablet 0   ursodiol (ACTIGALL) 300 MG capsule Take 300-600 mg by mouth See admin instructions. Take 2 capsules (600 mg) daily in the morning   & 1 capsule (300 mg) at night.     XARELTO 15 MG TABS tablet TAKE 1 TABLET(15 MG) BY MOUTH DAILY WITH SUPPER 90 tablet 1   No  current facility-administered medications for this visit.    Allergies:   Statins, Haldol [haloperidol lactate], Librax [chlordiazepoxide-clidinium], Plavix [clopidogrel bisulfate], and Ramipril    Social History:  The patient  reports that she has never smoked. She has never used smokeless tobacco. She reports that she does not drink alcohol and does not use drugs.   Family History:  The patient's family history includes Colon cancer in her daughter; Esophageal cancer in her brother; Ovarian cancer in her daughter; Stroke in her mother.    ROS:  Please see the history of present illness.   Otherwise, review of systems are positive for none.   All other systems are reviewed and negative.    PHYSICAL EXAM: VS:  BP 138/71 (BP Location: Left Arm, Patient Position: Sitting, Cuff Size: Normal)    Pulse 86    Ht 5\' 2"  (1.575 m)    Wt 139 lb 2 oz (63.1 kg)    SpO2 96%    BMI 25.45 kg/m  , BMI Body mass index is 25.45 kg/m. GEN: Well nourished, well developed, in no  acute distress  HEENT: normal  Neck: No JVD, carotid bruits, or masses Cardiac: Irregularly irregular; no rubs, or gallops.  1/ 6 systolic ejection murmur at the aortic area.  Mild bilateral leg edema Respiratory: Clear to auscultation GI: soft, nontender, nondistended, + BS MS: no deformity or atrophy  Skin: warm and dry, no rash Neuro:  Strength and sensation are intact Psych: euthymic mood, full affect   EKG:  EKG is ordered today. The ekg ordered today demonstrates atrial fibrillation with ventricular rate of 86 bpm, low voltage and nonspecific T wave changes.   Recent Labs: 04/05/2020: B Natriuretic Peptide 754.0 06/22/2020: ALT 13; TSH 2.12 09/12/2020: Hemoglobin 13.4; Platelets 187 09/19/2020: BUN 33; Creatinine, Ser 1.29; Potassium 5.0; Sodium 140    Lipid Panel    Component Value Date/Time   CHOL 143 06/22/2020 1018   TRIG 139.0 06/22/2020 1018   HDL 48.40 06/22/2020 1018   CHOLHDL 3 06/22/2020 1018   VLDL 27.8 06/22/2020 1018   LDLCALC 67 06/22/2020 1018   LDLDIRECT 143.5 12/17/2012 0955      Wt Readings from Last 3 Encounters:  03/01/21 139 lb 2 oz (63.1 kg)  02/16/21 137 lb 8 oz (62.4 kg)  01/04/21 136 lb 6 oz (61.9 kg)       ASSESSMENT AND PLAN:  1.    Permanent atrial fibrillation: Ventricular rate is much more controlled with the addition of diltiazem extended release.  She did develop a rash with this but it seems to be mild and improving.  Thus, we will continue the medication for now.  Continue metoprolol.    Continue anticoagulation with Xarelto 15 mg daily given underlying chronic kidney disease.  2.  Chronic diastolic heart failure: She appears to be euvolemic on current dose of torsemide.   3. Essential hypertension: Blood pressures well controlled on current medications.   4.  Functional mitral regurgitation: This was moderate on recent echocardiogram.  5.  Coronary artery disease involving native coronary arteries without angina: Continue  medical therapy.   Disposition:   FU with me in 4 months.  Signed,  [name], MD  03/01/2021 11:56 AM    [place]

## 2021-03-07 ENCOUNTER — Other Ambulatory Visit: Payer: Self-pay | Admitting: *Deleted

## 2021-03-07 DIAGNOSIS — D500 Iron deficiency anemia secondary to blood loss (chronic): Secondary | ICD-10-CM

## 2021-03-08 ENCOUNTER — Other Ambulatory Visit
Admission: RE | Admit: 2021-03-08 | Discharge: 2021-03-08 | Disposition: A | Discharge status: Home / Self Care | Payer: Medicare Other | Source: Ambulatory Visit | Attending: Rheumatology | Admitting: Rheumatology

## 2021-03-08 DIAGNOSIS — E790 Hyperuricemia without signs of inflammatory arthritis and tophaceous disease: Secondary | ICD-10-CM | POA: Diagnosis not present

## 2021-03-08 DIAGNOSIS — M25562 Pain in left knee: Secondary | ICD-10-CM | POA: Diagnosis not present

## 2021-03-08 DIAGNOSIS — M1990 Unspecified osteoarthritis, unspecified site: Secondary | ICD-10-CM | POA: Diagnosis not present

## 2021-03-08 DIAGNOSIS — G8929 Other chronic pain: Secondary | ICD-10-CM | POA: Insufficient documentation

## 2021-03-08 DIAGNOSIS — K7460 Unspecified cirrhosis of liver: Secondary | ICD-10-CM | POA: Diagnosis not present

## 2021-03-08 DIAGNOSIS — N1832 Chronic kidney disease, stage 3b: Secondary | ICD-10-CM | POA: Diagnosis not present

## 2021-03-08 LAB — SYNOVIAL CELL COUNT + DIFF, W/ CRYSTALS
Eosinophils-Synovial: 0 %
Lymphocytes-Synovial Fld: 20 %
Monocyte-Macrophage-Synovial Fluid: 58 %
Neutrophil, Synovial: 22 %
WBC, Synovial: 74 /mm3 (ref 0–200)

## 2021-03-10 ENCOUNTER — Other Ambulatory Visit: Payer: Self-pay | Admitting: Cardiovascular Disease

## 2021-03-15 ENCOUNTER — Inpatient Hospital Stay: Payer: Medicare Other | Attending: Oncology

## 2021-03-15 ENCOUNTER — Other Ambulatory Visit (HOSPITAL_COMMUNITY): Payer: Self-pay

## 2021-03-15 ENCOUNTER — Other Ambulatory Visit: Payer: Self-pay

## 2021-03-15 DIAGNOSIS — Z7901 Long term (current) use of anticoagulants: Secondary | ICD-10-CM | POA: Insufficient documentation

## 2021-03-15 DIAGNOSIS — I4821 Permanent atrial fibrillation: Secondary | ICD-10-CM | POA: Insufficient documentation

## 2021-03-15 DIAGNOSIS — Z888 Allergy status to other drugs, medicaments and biological substances status: Secondary | ICD-10-CM | POA: Diagnosis not present

## 2021-03-15 DIAGNOSIS — I2510 Atherosclerotic heart disease of native coronary artery without angina pectoris: Secondary | ICD-10-CM | POA: Insufficient documentation

## 2021-03-15 DIAGNOSIS — Z800 Family history of malignant neoplasm of digestive organs: Secondary | ICD-10-CM | POA: Insufficient documentation

## 2021-03-15 DIAGNOSIS — R040 Epistaxis: Secondary | ICD-10-CM | POA: Diagnosis not present

## 2021-03-15 DIAGNOSIS — I5032 Chronic diastolic (congestive) heart failure: Secondary | ICD-10-CM | POA: Diagnosis not present

## 2021-03-15 DIAGNOSIS — Z808 Family history of malignant neoplasm of other organs or systems: Secondary | ICD-10-CM | POA: Insufficient documentation

## 2021-03-15 DIAGNOSIS — R5383 Other fatigue: Secondary | ICD-10-CM | POA: Diagnosis not present

## 2021-03-15 DIAGNOSIS — D7589 Other specified diseases of blood and blood-forming organs: Secondary | ICD-10-CM | POA: Diagnosis not present

## 2021-03-15 DIAGNOSIS — Z8041 Family history of malignant neoplasm of ovary: Secondary | ICD-10-CM | POA: Diagnosis not present

## 2021-03-15 DIAGNOSIS — I252 Old myocardial infarction: Secondary | ICD-10-CM | POA: Diagnosis not present

## 2021-03-15 DIAGNOSIS — Z23 Encounter for immunization: Secondary | ICD-10-CM | POA: Insufficient documentation

## 2021-03-15 DIAGNOSIS — E039 Hypothyroidism, unspecified: Secondary | ICD-10-CM | POA: Insufficient documentation

## 2021-03-15 DIAGNOSIS — K754 Autoimmune hepatitis: Secondary | ICD-10-CM | POA: Diagnosis not present

## 2021-03-15 DIAGNOSIS — Z9049 Acquired absence of other specified parts of digestive tract: Secondary | ICD-10-CM | POA: Insufficient documentation

## 2021-03-15 DIAGNOSIS — N183 Chronic kidney disease, stage 3 unspecified: Secondary | ICD-10-CM | POA: Insufficient documentation

## 2021-03-15 DIAGNOSIS — Z8711 Personal history of peptic ulcer disease: Secondary | ICD-10-CM | POA: Diagnosis not present

## 2021-03-15 DIAGNOSIS — E538 Deficiency of other specified B group vitamins: Secondary | ICD-10-CM | POA: Insufficient documentation

## 2021-03-15 DIAGNOSIS — D500 Iron deficiency anemia secondary to blood loss (chronic): Secondary | ICD-10-CM | POA: Diagnosis not present

## 2021-03-15 DIAGNOSIS — Z801 Family history of malignant neoplasm of trachea, bronchus and lung: Secondary | ICD-10-CM | POA: Insufficient documentation

## 2021-03-15 DIAGNOSIS — I130 Hypertensive heart and chronic kidney disease with heart failure and stage 1 through stage 4 chronic kidney disease, or unspecified chronic kidney disease: Secondary | ICD-10-CM | POA: Insufficient documentation

## 2021-03-15 DIAGNOSIS — Z79899 Other long term (current) drug therapy: Secondary | ICD-10-CM | POA: Insufficient documentation

## 2021-03-15 LAB — CBC WITH DIFFERENTIAL/PLATELET
Abs Immature Granulocytes: 0.05 10*3/uL (ref 0.00–0.07)
Basophils Absolute: 0 10*3/uL (ref 0.0–0.1)
Basophils Relative: 0 %
Eosinophils Absolute: 0.1 10*3/uL (ref 0.0–0.5)
Eosinophils Relative: 1 %
HCT: 40.7 % (ref 36.0–46.0)
Hemoglobin: 12.8 g/dL (ref 12.0–15.0)
Immature Granulocytes: 1 %
Lymphocytes Relative: 12 %
Lymphs Abs: 0.9 10*3/uL (ref 0.7–4.0)
MCH: 33.2 pg (ref 26.0–34.0)
MCHC: 31.4 g/dL (ref 30.0–36.0)
MCV: 105.4 fL — ABNORMAL HIGH (ref 80.0–100.0)
Monocytes Absolute: 0.9 10*3/uL (ref 0.1–1.0)
Monocytes Relative: 11 %
Neutro Abs: 5.7 10*3/uL (ref 1.7–7.7)
Neutrophils Relative %: 75 %
Platelets: 186 10*3/uL (ref 150–400)
RBC: 3.86 MIL/uL — ABNORMAL LOW (ref 3.87–5.11)
RDW: 14.5 % (ref 11.5–15.5)
WBC: 7.6 10*3/uL (ref 4.0–10.5)
nRBC: 0 % (ref 0.0–0.2)

## 2021-03-15 LAB — VITAMIN B12: Vitamin B-12: 739 pg/mL (ref 180–914)

## 2021-03-15 LAB — FERRITIN: Ferritin: 68 ng/mL (ref 11–307)

## 2021-03-15 LAB — FOLATE: Folate: 66 ng/mL (ref >5.9)

## 2021-03-16 ENCOUNTER — Ambulatory Visit (HOSPITAL_BASED_OUTPATIENT_CLINIC_OR_DEPARTMENT_OTHER)
Admission: RE | Admit: 2021-03-16 | Discharge: 2021-03-16 | Disposition: A | Discharge status: Home / Self Care | Payer: Medicare Other | Source: Ambulatory Visit | Attending: Internal Medicine | Admitting: Internal Medicine

## 2021-03-16 ENCOUNTER — Encounter (HOSPITAL_COMMUNITY): Payer: Self-pay | Admitting: Internal Medicine

## 2021-03-16 ENCOUNTER — Ambulatory Visit (HOSPITAL_COMMUNITY)
Admission: RE | Admit: 2021-03-16 | Discharge: 2021-03-16 | Disposition: A | Discharge status: Home / Self Care | Payer: Medicare Other | Source: Ambulatory Visit | Attending: Internal Medicine | Admitting: Internal Medicine

## 2021-03-16 VITALS — BP 120/84 | HR 112 | Wt 136.2 lb

## 2021-03-16 DIAGNOSIS — Z8711 Personal history of peptic ulcer disease: Secondary | ICD-10-CM | POA: Insufficient documentation

## 2021-03-16 DIAGNOSIS — I2510 Atherosclerotic heart disease of native coronary artery without angina pectoris: Secondary | ICD-10-CM | POA: Insufficient documentation

## 2021-03-16 DIAGNOSIS — Z79899 Other long term (current) drug therapy: Secondary | ICD-10-CM | POA: Diagnosis not present

## 2021-03-16 DIAGNOSIS — I340 Nonrheumatic mitral (valve) insufficiency: Secondary | ICD-10-CM | POA: Diagnosis not present

## 2021-03-16 DIAGNOSIS — I4821 Permanent atrial fibrillation: Secondary | ICD-10-CM | POA: Diagnosis not present

## 2021-03-16 DIAGNOSIS — N184 Chronic kidney disease, stage 4 (severe): Secondary | ICD-10-CM | POA: Insufficient documentation

## 2021-03-16 DIAGNOSIS — I083 Combined rheumatic disorders of mitral, aortic and tricuspid valves: Secondary | ICD-10-CM | POA: Diagnosis not present

## 2021-03-16 DIAGNOSIS — I482 Chronic atrial fibrillation, unspecified: Secondary | ICD-10-CM

## 2021-03-16 DIAGNOSIS — I130 Hypertensive heart and chronic kidney disease with heart failure and stage 1 through stage 4 chronic kidney disease, or unspecified chronic kidney disease: Secondary | ICD-10-CM | POA: Diagnosis not present

## 2021-03-16 DIAGNOSIS — Z7901 Long term (current) use of anticoagulants: Secondary | ICD-10-CM | POA: Diagnosis not present

## 2021-03-16 DIAGNOSIS — I5032 Chronic diastolic (congestive) heart failure: Secondary | ICD-10-CM | POA: Diagnosis not present

## 2021-03-16 LAB — CBC
HCT: 40.5 % (ref 36.0–46.0)
Hemoglobin: 13.2 g/dL (ref 12.0–15.0)
MCH: 34 pg (ref 26.0–34.0)
MCHC: 32.6 g/dL (ref 30.0–36.0)
MCV: 104.4 fL — ABNORMAL HIGH (ref 80.0–100.0)
Platelets: 167 10*3/uL (ref 150–400)
RBC: 3.88 MIL/uL (ref 3.87–5.11)
RDW: 14.3 % (ref 11.5–15.5)
WBC: 5.8 10*3/uL (ref 4.0–10.5)
nRBC: 0 % (ref 0.0–0.2)

## 2021-03-16 LAB — BASIC METABOLIC PANEL
Anion gap: 8 (ref 5–15)
BUN: 40 mg/dL — ABNORMAL HIGH (ref 8–23)
CO2: 22 mmol/L (ref 22–32)
Calcium: 8.8 mg/dL — ABNORMAL LOW (ref 8.9–10.3)
Chloride: 107 mmol/L (ref 98–111)
Creatinine, Ser: 1.3 mg/dL — ABNORMAL HIGH (ref 0.44–1.00)
GFR, Estimated: 39 mL/min — ABNORMAL LOW (ref >60)
Glucose, Bld: 86 mg/dL (ref 70–99)
Potassium: 4.2 mmol/L (ref 3.5–5.1)
Sodium: 137 mmol/L (ref 135–145)

## 2021-03-16 LAB — ECHOCARDIOGRAM COMPLETE
AR max vel: 1.25 cm2
AV Peak grad: 9.7 mmHg
Ao pk vel: 1.56 m/s
Area-P 1/2: 4.04 cm2
Calc EF: 58.2 %
MV VTI: 1.46 cm2
P 1/2 time: 448 ms
S' Lateral: 2.1 cm
Single Plane A2C EF: 57.4 %
Single Plane A4C EF: 57.3 %

## 2021-03-16 NOTE — Progress Notes (Signed)
Advanced Heart Failure Clinic Note   Date:  03/16/2021   ID:  Ruth Roth, DOB 1928-06-02, MRN [id number]  Location: Home  Provider location: [facility] Type of Visit: Established patient  PCP:  Einar Pheasant, MD  Cardiologist:  [name], MD Primary HF: [place] [facility]: Dr. Holley Raring  Chief Complaint: Heart Failure follow-up   History of Present Illness:  Ruth Roth is a 88 y.o. female with a history of CAD, chronic diastolic HF with severe mitral reguritationm, chronic atrial fibrillation, hypertension, peptic ulcer disease.   Admitted [facility] 08/21/17 s/p R/LHC which showed significantly elevated filling pressures, severe MR, and low cardiac output, and then transferred to [facility] 6/14 for consideration of MitraClip.  AKI noted on admission felt to be due to cardiorenal syndrome. She required milrinone and lasix for diuresis. Diuresed 25 lbs and transitioned to 20 mg torsemide daily. Case and images reviewed at length with structural heart team. MR felt to be truly functional with improvement from hemodynamic optimization. Not felt to be MitraClip candidate. Discharge weight: 121 lbs.    In 2020 had recurrent AF with RVR Amio 200 bid started. Underwent DC-CV on 9/210/20. PCP cut back torsemide to 20 qOD due to AKI but developed volume overload. Torsemide increased back to 20 daily. Developed recurrent AF and Amio stopped. Saw Dr. Fletcher Anon in 04/30/19 and metoprolol increased to 75 bid for better rate control. Says she felt better on the 50 but is tolerating this ok.  Here for routine follow-up. Doing well. No SOB, edema orthopnea or PND. HR 70-80s at home on her BP cuff. No bleeding with Xarelto.    Echo 12/21 EF 55-60% Moderate RV HK. Personally reviewed  Echo today 03/16/20 EF 60-65% RV normal. Mild MR/AI Personally reviewed   Cardiac Studies: Echo 10/09/17: EF 55-60%, no AI, mild to mod MR, LA moderately dilated, RA mildly dilated, RV normal, PA  peak pressure 52 mmHg   [facility] 08/21/2017 - Mild non-obstructive CAD RHC Procedural Findings: (Obtained from Scan 08/21/17 at 1030) Hemodynamics (mmHg) RA mean 17 RV 68/11 (19) PA 62/27 (44) PCWP 41  AO 147/67 (102) Cardiac Output (Fick) 2.45 Cardiac Index  (Fick) 1.46 PVR 1.23 WU   Echo 08/01/17 LVEF 50-55%, Mild AS, Mod MR, Mod LAE, Mild RAE, Mod TR, PA peak pressure 65 mm Hg. MR ++ calcified, and RV mild/moderately reduced function.       Past Medical History:  Diagnosis Date   (HFpEF) heart failure with preserved ejection fraction ([place])    a. 07/2017 Echo: EF 50-55%, no rwma, mild AS/AI, mod MR, mod dil LA, mod TR, PASP 43mmHg.   Autoimmune hepatitis ([place])    followed by Dr [name]   Fibrocystic breast disease    Glaucoma    Hypercholesterolemia    Hypertension    Hypothyroidism    Inflammatory arthritis    MI (myocardial infarction) ([place])    Neuropathy    Non-obstructive Coronary artery disease    a. 05/2014 NSTEMI/Cath: LM nl, LAD mild dzs, LCX 60ost hazy (? culprit), RCA mild dzs. EF 60% by echo-->Med Rx; b. 08/2017 Cath: LM 30ost, LAD min irregs, LCX small, 40ost/p, RCA large, min irregs, RPDA/RPL1 nl, EF 50-55%. 4+MR.   Osteoarthritis    Permanent atrial fibrillation (HCC)    a. CHA2DS2VASc = 6-->Xarelto.   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 07/2017 Echo:  Mod MR; b. 08/2017 Cath: 4+/Severe MR.   Past Surgical History:  Procedure Laterality Date   ABDOMINAL HYSTERECTOMY  1963   partial, secondary to fibroids   APPENDECTOMY     Billroth     CARDIAC CATHETERIZATION  05/12/14   [place]   CARDIOVERSION N/A 12/26/2016   Procedure: CARDIOVERSION;  Surgeon: [name], MD;  Location: [facility] ORS;  Service: Cardiovascular;  Laterality: N/A;   CARDIOVERSION N/A 05/16/2017   Procedure: CARDIOVERSION;  Surgeon: [name], MD;  Location: [facility] ORS;  Service: Cardiovascular;   Laterality: N/A;   CARDIOVERSION N/A 06/09/2017   Procedure: CARDIOVERSION;  Surgeon: [name], MD;  Location: [facility] ORS;  Service: Cardiovascular;  Laterality: N/A;   CARDIOVERSION N/A 11/30/2018   Procedure: CARDIOVERSION;  Surgeon: [name], MD;  Location: [facility] ORS;  Service: Cardiovascular;  Laterality: N/A;   CHOLECYSTECTOMY  1998   COLONOSCOPY  2009   RIGHT/LEFT HEART CATH AND CORONARY ANGIOGRAPHY Bilateral 08/21/2017   Procedure: RIGHT/LEFT HEART CATH AND CORONARY ANGIOGRAPHY;  Surgeon: [name], MD;  Location: [place] [facility];  Service: Cardiovascular;  Laterality: Bilateral;   TEE WITHOUT CARDIOVERSION N/A 08/25/2017   Procedure: TRANSESOPHAGEAL ECHOCARDIOGRAM (TEE);  Surgeon: Jolaine Artist, MD;  Location: [facility] ENDOSCOPY;  Service: Cardiovascular;  Laterality: N/A;   UPPER GI ENDOSCOPY  2004     Current Outpatient Medications  Medication Sig Dispense Refill   allopurinol (ZYLOPRIM) 100 MG tablet Take 0.5 tablets (50 mg total) by mouth daily. 45 tablet 1   colchicine 0.6 MG tablet Take 1 tablet (0.6 mg total) by mouth daily as needed. 10 tablet 2   CYANOCOBALAMIN PO Take 500 mcg by mouth once a week.     diltiazem (CARDIZEM CD) 120 MG 24 hr capsule TAKE 1 CAPSULE(120 MG) BY MOUTH DAILY 90 capsule 1   dorzolamide (TRUSOPT) 2 % ophthalmic solution Place 1 drop into the left eye 2 (two) times daily.     fluticasone (FLONASE) 50 MCG/ACT nasal spray Place 2 sprays into both nostrils daily as needed for allergies.      gabapentin (NEURONTIN) 300 MG capsule One capsule tid and two capsules q hs. 150 capsule 2   hydroxychloroquine (PLAQUENIL) 200 MG tablet Take 200 mg by mouth every other day.     ipratropium (ATROVENT) 0.03 % nasal spray SMARTSIG:2 Spray(s) Both Nares 3 Times Daily PRN     latanoprost (XALATAN) 0.005 % ophthalmic solution Place 1 drop into both eyes at bedtime.      levothyroxine (SYNTHROID) 112 MCG tablet TAKE 1 TABLET(112 MCG) BY MOUTH  DAILY BEFORE BREAKFAST 90 tablet 1   metoprolol tartrate (LOPRESSOR) 100 MG tablet Take 1.5 tablets (150 mg total) by mouth 2 (two) times daily. 270 tablet 0   Multiple Vitamin (MULTI-VITAMIN) tablet Take 1 tablet by mouth at bedtime.      pantoprazole (PROTONIX) 20 MG tablet TAKE 1 TABLET BY MOUTH EVERY DAY 90 tablet 1   torsemide (DEMADEX) 20 MG tablet ALTERNATE 2 TABLETS BY MOUTH ONE DAY AND 1 TABLET THE NEXT DAY. 144 tablet 0   traMADol (ULTRAM) 50 MG tablet Take 1 tablet (50 mg total) by mouth 2 (two) times daily as needed (for pain.). 60 tablet 0   ursodiol (ACTIGALL) 300 MG capsule Take 300-600 mg by mouth See admin instructions. Take 2 capsules (600 mg) daily in the morning   & 1 capsule (300 mg) at night.     XARELTO 15 MG TABS tablet TAKE 1  TABLET(15 MG) BY MOUTH DAILY WITH SUPPER 90 tablet 1   No current facility-administered medications for this encounter.    Allergies:   Statins, Haldol [haloperidol lactate], Librax [chlordiazepoxide-clidinium], Plavix [clopidogrel bisulfate], and Ramipril   Social History:  The patient  reports that she has never smoked. She has never used smokeless tobacco. She reports that she does not drink alcohol and does not use drugs.   Family History:  The patient's family history includes Colon cancer in her daughter; Esophageal cancer in her brother; Ovarian cancer in her daughter; Stroke in her mother.   ROS:  Please see the history of present illness.   All other systems are personally reviewed and negative.   Vitals:   03/16/21 1212  BP: 120/84  Pulse: (!) 112  SpO2: 100%  Weight: 61.8 kg (136 lb 3.2 oz)     Exam:   General:  Well appearing. No resp difficulty HEENT: normal Neck: supple. JVP 7  Carotids 2+ bilat; no bruits. No lymphadenopathy or thryomegaly appreciated. Cor: PMI nondisplaced. Irregular rate & rhythm. No rubs, gallops or murmurs. Lungs: clear Abdomen: soft, nontender, nondistended. No hepatosplenomegaly. No bruits or  masses. Good bowel sounds. Extremities: no cyanosis, clubbing, rash, edema Neuro: alert & orientedx3, cranial nerves grossly intact. moves all 4 extremities w/o difficulty. Affect pleasant  Recent Labs: 04/05/2020: B Natriuretic Peptide 754.0 06/22/2020: ALT 13; TSH 2.12 09/19/2020: BUN 33; Creatinine, Ser 1.29; Potassium 5.0; Sodium 140 03/15/2021: Hemoglobin 12.8; Platelets 186  Personally reviewed   Wt Readings from Last 3 Encounters:  03/16/21 61.8 kg (136 lb 3.2 oz)  03/01/21 63.1 kg (139 lb 2 oz)  02/16/21 62.4 kg (137 lb 8 oz)     ASSESSMENT AND PLAN:  1.  Chronic diastolic congestive heart failure with h/o low output:  - Echo 08/01/17 LVEF 50-55%, Mild AS, Mod MR, Mod LAE, Mild RAE, Mod TR, RV moderately HK PA peak pressure 65 mm Hg. - Cath 08/21/17 with signficantly elevated filling pressures, severe MR, and low cardiac output. Had cardiorenal syndrome on admit with severe MR and RV failure and required milrinone - Echo 10/09/17: EF 55-60%, mild to mod MR - Echo 12/20 EF 60-65% mod MR  - Echo 12/21 EF 55-60% Moderate RV HK.  Moderate MR  - Echo today 03/16/20 EF 60-65% RV normal. Mild MR/AI Personally reviewed - Stable NYHA II. Volume status looks good. Continue current regimen of torsemide 40 daily alternating with 20 daily. - Labs today  2.  Severe mitral regurgitation:  - Echo 08/01/17 LVEF 50-55%, Mild AS, Mod MR, Mod LAE, Mild RAE, Mod TR, PA peak pressure 65 mm Hg. MR ++ calcified, and RV mild/moderately reduced function.  - TEE 6/17 showed only mild MR with restricted posterior leaflet.  -Echo 10/09/17: EF 55-60%, mild to mod MR  -  Echo 12/20 EF 60-65% mod MR  - Echo today 03/16/20 EF 60-65% RV normal. Mild MR/AI Personally reviewed - Likely functional MR and MR improved with hemodynamic optimization.  Doing well now. MR mild.    3.  Chronic atrial fibrillation: - Has previously failed amiodarone and DCCV x3.  - Doubt we will be able to maintain NSR. Seems to be tolerating AF  ok. If cannot maintain rate control will need AVN ablation and CRT  - Continue metoprolol 100 bid.  - Continue Xarelto. No bleeding. CBC today - Seen by Dr. Rayann Heman 12/01/17. He did not recommend either afib ablation or AV node ablation + PPM at that time - Good  rate control. Zio 9/21 Chronic AF. Mean HR 90.   4.  Essential hypertension:  - Blood pressure well controlled. Continue current regimen.  5. CKD IV with h/o AKI - Creatinine stable 1.8-1.9 - recheck labs today - Follows with Dr. Loletta Parish, MD  12:43 PM   [place]  [phone] (office) [phone] (fax)

## 2021-03-16 NOTE — Patient Instructions (Signed)
Good to see you today  Lab work done today we will contact you with any abnormal results  Your physician recommends that you schedule a follow-up appointment in: 6 months Call office in May for July appointment  If you have any questions or concerns before your next appointment please send [place] a message through [place] or call our office at [phone].    TO LEAVE A MESSAGE FOR THE NURSE SELECT OPTION 2, PLEASE LEAVE A MESSAGE INCLUDING: YOUR NAME DATE OF BIRTH CALL BACK NUMBER REASON FOR CALL**this is important as we prioritize the call backs  YOU WILL RECEIVE A CALL BACK THE SAME DAY AS LONG AS YOU CALL BEFORE 4:00 PM  At the [facility], you and your health needs are our priority. As part of our continuing mission to provide you with exceptional heart care, we have created designated Provider Care Teams. These Care Teams include your primary Cardiologist (physician) and Advanced Practice Providers (APPs- Physician Assistants and Nurse Practitioners) who all work together to provide you with the care you need, when you need it.   You may see any of the following providers on your designated Care Team at your next follow up: Dr Glori Bickers Dr Haynes Kerns, NP Lyda Jester, [place] [facility] [place], [place] Audry Riles, PharmD   Please be sure to bring in all your medications bottles to every appointment.   Do the following things EVERYDAY: Weigh yourself in the morning before breakfast. Write it down and keep it in a log. Take your medicines as prescribed Eat low salt foods--Limit salt (sodium) to 2000 mg per day.  Stay as active as you can everyday Limit all fluids for the day to less than 2 liters

## 2021-03-19 ENCOUNTER — Inpatient Hospital Stay (HOSPITAL_BASED_OUTPATIENT_CLINIC_OR_DEPARTMENT_OTHER): Payer: Medicare Other | Admitting: Oncology

## 2021-03-19 ENCOUNTER — Inpatient Hospital Stay: Payer: Medicare Other

## 2021-03-19 ENCOUNTER — Encounter: Payer: Self-pay | Admitting: Oncology

## 2021-03-19 ENCOUNTER — Other Ambulatory Visit: Payer: Self-pay

## 2021-03-19 VITALS — BP 139/95 | HR 62 | Temp 96.4°F | Resp 18 | Wt 135.1 lb

## 2021-03-19 DIAGNOSIS — D7589 Other specified diseases of blood and blood-forming organs: Secondary | ICD-10-CM | POA: Diagnosis not present

## 2021-03-19 DIAGNOSIS — R5383 Other fatigue: Secondary | ICD-10-CM | POA: Diagnosis not present

## 2021-03-19 DIAGNOSIS — E538 Deficiency of other specified B group vitamins: Secondary | ICD-10-CM

## 2021-03-19 DIAGNOSIS — D500 Iron deficiency anemia secondary to blood loss (chronic): Secondary | ICD-10-CM

## 2021-03-19 DIAGNOSIS — K754 Autoimmune hepatitis: Secondary | ICD-10-CM | POA: Diagnosis not present

## 2021-03-19 DIAGNOSIS — R040 Epistaxis: Secondary | ICD-10-CM | POA: Diagnosis not present

## 2021-03-19 NOTE — Progress Notes (Signed)
Patient here for follow up. No new concerns voiced.  °

## 2021-03-19 NOTE — Progress Notes (Signed)
Hematology/Oncology follow up note [phone] [phone]   Patient Care Team: Einar Pheasant, MD as PCP - General (Unknown Physician Specialty) [name], MD as PCP - Cardiology (Cardiology) Bensimhon, Shaune Pascal, MD as PCP - Advanced Heart Failure (Cardiology) Byrnett, Forest Gleason, MD (General Surgery) Earlie Server, MD as Consulting Physician (Oncology)  REFERRING PROVIDER: Einar Pheasant, MD CHIEF COMPLAINTS/REASON FOR VISIT:  Follow-up for anemia  HISTORY OF PRESENTING ILLNESS:  Ruth Roth is a  88 y.o.  female with PMH listed below who was referred to me for evaluation of anemia Reviewed patient's recent labs, Labs revealed anemia with hemoglobin of 10.2 on 01/27/2018. MCV 88.9,   Reviewed patient's previous labs ordered by primary care physician's office, anemia is chronic onset , duration is since 2016, gradually declines. No aggravating or improving factors. She takes Xarelto for A Fib. Has recurrent epistaxis. History of IDA, cannot tolerate oral iron supplements.   Associated signs and symptoms: Patient reports fatigue.  deniesSOB with exertion.  Denies weight loss, easy bruising, hematochezia, hemoptysis, hematuria. Context: History of GI bleeding: deneis               History of Chronic kidney disease: CKD               Last colonoscopy: 2007 Patient has a history of autoimmune hepatitis/PBC  Follows up with gastroenterology.  # received IV Feraheme 510 mg x 2. INTERVAL HISTORY Ruth Roth is a 88 y.o. female who has above history reviewed by me today presents for follow up visit for management of anemia  Patient is on chronic anticoagulation with Xarelto for atrial fibrillation. Denies any acute bleeding events. She reports feeling well.  No significant more fatigue then her baseline.     Review of Systems  Constitutional:  Positive for fatigue. Negative for appetite change, chills and fever.  HENT:   Negative for hearing loss and  voice change.   Eyes:  Negative for eye problems.  Respiratory:  Negative for chest tightness and cough.   Cardiovascular:  Negative for chest pain.  Gastrointestinal:  Negative for abdominal distention, abdominal pain and blood in stool.  Endocrine: Negative for hot flashes.  Genitourinary:  Negative for difficulty urinating and frequency.   Musculoskeletal:  Negative for arthralgias.  Skin:  Negative for itching and rash.  Neurological:  Negative for extremity weakness.  Hematological:  Negative for adenopathy.  Psychiatric/Behavioral:  Negative for confusion.    MEDICAL HISTORY:  Past Medical History:  Diagnosis Date   (HFpEF) heart failure with preserved ejection fraction ([place])    a. 07/2017 Echo: EF 50-55%, no rwma, mild AS/AI, mod MR, mod dil LA, mod TR, PASP 103mHg.   Autoimmune hepatitis ([place]    followed by Dr SGustavo Lah  Fibrocystic breast disease    Glaucoma    Hypercholesterolemia    Hypertension    Hypothyroidism    Inflammatory arthritis    MI (myocardial infarction) ([place]    Neuropathy    Non-obstructive Coronary artery disease    a. 05/2014 NSTEMI/Cath: LM nl, LAD mild dzs, LCX 60ost hazy (? culprit), RCA mild dzs. EF 60% by echo-->Med Rx; b. 08/2017 Cath: LM 30ost, LAD min irregs, LCX small, 40ost/p, RCA large, min irregs, RPDA/RPL1 nl, EF 50-55%. 4+MR.   Osteoarthritis    Permanent atrial fibrillation (HCC)    a. CHA2DS2VASc = 6-->Xarelto.   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 07/2017 Echo: Mod MR; b. 08/2017 Cath: 4+/Severe MR.    SURGICAL HISTORY: Past Surgical History:  Procedure Laterality Date   ABDOMINAL HYSTERECTOMY  1963   partial, secondary to fibroids   APPENDECTOMY     [place]     CARDIAC CATHETERIZATION  05/12/14   [place]   CARDIOVERSION N/A 12/26/2016   Procedure: CARDIOVERSION;  Surgeon: [name], MD;  Location: [facility] ORS;  Service:  Cardiovascular;  Laterality: N/A;   CARDIOVERSION N/A 05/16/2017   Procedure: CARDIOVERSION;  Surgeon: [name], MD;  Location: [facility];  Service: Cardiovascular;  Laterality: N/A;   CARDIOVERSION N/A 06/09/2017   Procedure: CARDIOVERSION;  Surgeon: [name], MD;  Location: [facility];  Service: Cardiovascular;  Laterality: N/A;   CARDIOVERSION N/A 11/30/2018   Procedure: CARDIOVERSION;  Surgeon: [name], MD;  Location: [facility];  Service: Cardiovascular;  Laterality: N/A;   CHOLECYSTECTOMY  1998   COLONOSCOPY  2009   RIGHT/LEFT HEART CATH AND CORONARY ANGIOGRAPHY Bilateral 08/21/2017   Procedure: RIGHT/LEFT HEART CATH AND CORONARY ANGIOGRAPHY;  Surgeon: [name], MD;  Location: [place] [facility];  Service: Cardiovascular;  Laterality: Bilateral;   TEE WITHOUT CARDIOVERSION N/A 08/25/2017   Procedure: TRANSESOPHAGEAL ECHOCARDIOGRAM (TEE);  Surgeon: Jolaine Artist, MD;  Location: [facility] ENDOSCOPY;  Service: Cardiovascular;  Laterality: N/A;   UPPER GI ENDOSCOPY  2004    SOCIAL HISTORY: Social History   Socioeconomic History   Marital status: Widowed    Spouse name: Not on file   Number of children: 2   Years of education: Not on file   Highest education level: Not on file  Occupational History   Occupation: retired  Tobacco Use   Smoking status: Never   Smokeless tobacco: Never  Vaping Use   Vaping Use: Never used  Substance and Sexual Activity   Alcohol use: No    Alcohol/week: 0.0 standard drinks   Drug use: No   Sexual activity: Never  Other Topics Concern   Not on file  Social History Narrative   Not on file   Social Determinants of Health   Financial Resource Strain: Low Risk    Difficulty of Paying Living Expenses: Not hard at all  Food Insecurity: No Food Insecurity   Worried About Charity fundraiser in the Last Year: Never true   [place] in the Last Year: Never true  Transportation Needs: No Transportation Needs   Lack  of Transportation (Medical): No   Lack of Transportation (Non-Medical): No  Physical Activity: Insufficiently Active   Days of Exercise per Week: 3 days   Minutes of Exercise per Session: 40 min  Stress: No Stress Concern Present   Feeling of Stress : Not at all  Social Connections: Unknown   Frequency of Communication with Friends and Family: More than three times a week   Frequency of Social Gatherings with Friends and Family: More than three times a week   Attends Religious Services: Not on Electrical engineer or Organizations: Not on file   Attends Archivist Meetings: Not on file   Marital Status: Not on file  Intimate Partner Violence: Not At Risk   Fear of Current or Ex-Partner: No   Emotionally Abused: No   Physically Abused: No   Sexually Abused: No    FAMILY HISTORY: Family History  Problem Relation Age of Onset   Stroke Mother    Esophageal cancer  Brother        also had lung cancer   Ovarian cancer Daughter    Colon cancer Daughter    Breast cancer Neg Hx     ALLERGIES:  is allergic to statins, haldol [haloperidol lactate], librax [chlordiazepoxide-clidinium], plavix [clopidogrel bisulfate], and ramipril.  MEDICATIONS:  Current Outpatient Medications  Medication Sig Dispense Refill   allopurinol (ZYLOPRIM) 100 MG tablet Take 0.5 tablets (50 mg total) by mouth daily. 45 tablet 1   CYANOCOBALAMIN PO Take 500 mcg by mouth once a week.     diltiazem (CARDIZEM CD) 120 MG 24 hr capsule TAKE 1 CAPSULE(120 MG) BY MOUTH DAILY 90 capsule 1   dorzolamide (TRUSOPT) 2 % ophthalmic solution Place 1 drop into the left eye 2 (two) times daily.     fluticasone (FLONASE) 50 MCG/ACT nasal spray Place 2 sprays into both nostrils daily as needed for allergies.      gabapentin (NEURONTIN) 300 MG capsule One capsule tid and two capsules q hs. 150 capsule 2   hydroxychloroquine (PLAQUENIL) 200 MG tablet Take 200 mg by mouth every other day.     ipratropium  (ATROVENT) 0.03 % nasal spray SMARTSIG:2 Spray(s) Both Nares 3 Times Daily PRN     latanoprost (XALATAN) 0.005 % ophthalmic solution Place 1 drop into both eyes at bedtime.      levothyroxine (SYNTHROID) 112 MCG tablet TAKE 1 TABLET(112 MCG) BY MOUTH DAILY BEFORE BREAKFAST 90 tablet 1   metoprolol tartrate (LOPRESSOR) 100 MG tablet Take 1.5 tablets (150 mg total) by mouth 2 (two) times daily. 270 tablet 0   Multiple Vitamin (MULTI-VITAMIN) tablet Take 1 tablet by mouth at bedtime.      pantoprazole (PROTONIX) 20 MG tablet TAKE 1 TABLET BY MOUTH EVERY DAY 90 tablet 1   torsemide (DEMADEX) 20 MG tablet ALTERNATE 2 TABLETS BY MOUTH ONE DAY AND 1 TABLET THE NEXT DAY. 144 tablet 0   traMADol (ULTRAM) 50 MG tablet Take 1 tablet (50 mg total) by mouth 2 (two) times daily as needed (for pain.). 60 tablet 0   ursodiol (ACTIGALL) 300 MG capsule Take 300-600 mg by mouth See admin instructions. Take 2 capsules (600 mg) daily in the morning   & 1 capsule (300 mg) at night.     chlorhexidine (PERIDEX) 0.12 % solution SMARTSIG:By Mouth     colchicine 0.6 MG tablet Take 1 tablet (0.6 mg total) by mouth daily as needed. (Patient not taking: Reported on 03/19/2021) 10 tablet 2   XARELTO 15 MG TABS tablet TAKE 1 TABLET(15 MG) BY MOUTH DAILY WITH SUPPER 90 tablet 1   No current facility-administered medications for this visit.     PHYSICAL EXAMINATION: ECOG PERFORMANCE STATUS: 1 - Symptomatic but completely ambulatory Vitals:   03/19/21 1327  BP: (!) 139/95  Pulse: 62  Resp: 18  Temp: (!) 96.4 F (35.8 C)   Filed Weights   03/19/21 1327  Weight: 135 lb 1.6 oz (61.3 kg)    Physical Exam Constitutional:      General: She is not in acute distress. HENT:     Head: Normocephalic and atraumatic.  Eyes:     General: No scleral icterus.    Pupils: Pupils are equal, round, and reactive to light.  Cardiovascular:     Rate and Rhythm: Normal rate and regular rhythm.     Heart sounds: Normal heart sounds.      Comments: Irregular rhythm.  Pulmonary:     Effort: Pulmonary effort is normal. No respiratory  distress.     Breath sounds: No wheezing.  Abdominal:     General: Bowel sounds are normal. There is no distension.     Palpations: Abdomen is soft. There is no mass.     Tenderness: There is no abdominal tenderness.  Musculoskeletal:        General: No deformity. Normal range of motion.     Cervical back: Normal range of motion and neck supple.  Skin:    General: Skin is warm and dry.     Findings: No erythema or rash.  Neurological:     Mental Status: She is alert and oriented to person, place, and time. Mental status is at baseline.     Cranial Nerves: No cranial nerve deficit.     Coordination: Coordination normal.  Psychiatric:        Mood and Affect: Mood normal.     LABORATORY DATA:  I have reviewed the data as listed Lab Results  Component Value Date   WBC 5.8 03/16/2021   HGB 13.2 03/16/2021   HCT 40.5 03/16/2021   MCV 104.4 (H) 03/16/2021   PLT 167 03/16/2021   Recent Labs    04/05/20 1430 05/08/20 1312 06/22/20 1018 09/01/20 1343 09/19/20 1004 03/16/21 1311  NA 138 138 140 137 140 137  K 4.0 3.8 4.6 5.2* 5.0 4.2  CL 101 101 106 102 102 107  CO2 25 25 26 27 23 22   GLUCOSE 97 119* 88 70 93 86  BUN 92* 82* 46* 52* 33 40*  CREATININE 1.80* 1.53* 1.64* 1.89* 1.29* 1.30*  CALCIUM 9.3 9.4 9.1 9.0 9.0 8.8*  GFRNONAA 27* 32*  --  25*  --  39*  PROT 6.8 6.9 6.3  --   --   --   ALBUMIN 4.0 4.1 3.9  --   --   --   AST 18 15 11   --   --   --   ALT 19 20 13   --   --   --   ALKPHOS 69 87 77  --   --   --   BILITOT 0.6 0.6 0.5  --   --   --   BILIDIR  --   --  0.1  --   --   --     Iron/TIBC/Ferritin/ %Sat    Component Value Date/Time   IRON 92 09/12/2020 1312   TIBC 490 (H) 09/12/2020 1312   FERRITIN 68 03/15/2021 1340   IRONPCTSAT 19 09/12/2020 1312     RADIOGRAPHIC STUDIES: I have personally reviewed the radiological images as listed and agreed with the  findings in the report. 06/17/2018 Ultrasound abdomen complete Heterogeneous echotexture in the liver.  Extrahepatic and intrahepatic.  Duct dilation, stable since prior study. Small hyperechoic area within the spleen the largest 7 mm.  Compatible with a hemangioma.  Stable Prior cholecystectomy. Mildly increased texture in the kidney suggest chronic kidney disease.  ASSESSMENT & PLAN:  1. Iron deficiency anemia due to chronic blood loss   2. Macrocytosis   3. Vitamin B 12 deficiency    #Iron deficiency anemia Labs reviewed and discussed with patient Ferritin is 68, CBC showed normal hemoglobin.  I will hold off IV iron at this point.  Continue monitoring every 6 months.   #History of vitamin B12 deficiency., B12 is adequate, around 1500 Continue oral vitamin B12-OTC supply-500 MCG weekly.  #Macrocytosis, MCV 105.  Side effects due to allopurinol versus?  Underlying MDS.  She is asymptomatic.  I  will hold off bone marrow biopsy right now   #History of autoimmune hepatitis/PBC, continue follow-up with gastroenterology.  Orders Placed This Encounter  Procedures   CBC with Differential/Platelet    Standing Status:   Future    Standing Expiration Date:   03/19/2022   Ferritin    Standing Status:   Future    Standing Expiration Date:   03/19/2022   Iron and TIBC    Standing Status:   Future    Standing Expiration Date:   03/19/2022   Folate    Standing Status:   Future    Standing Expiration Date:   03/19/2022   Vitamin B12    Standing Status:   Future    Standing Expiration Date:   03/19/2022    All questions were answered. The patient knows to call the clinic with any problems questions or concerns. We spent sufficient time to discuss many aspect of care, questions were answered to patient's satisfaction.   Return of visit: 6 months   Earlie Server, MD, PhD 03/19/2021

## 2021-03-20 ENCOUNTER — Ambulatory Visit: Payer: Medicare Other | Admitting: Cardiovascular Disease

## 2021-03-24 ENCOUNTER — Other Ambulatory Visit: Payer: Self-pay

## 2021-03-24 ENCOUNTER — Ambulatory Visit
Admission: EM | Admit: 2021-03-24 | Discharge: 2021-03-24 | Disposition: A | Discharge status: Home / Self Care | Payer: Medicare Other | Attending: Family Medicine | Admitting: Family Medicine

## 2021-03-24 ENCOUNTER — Other Ambulatory Visit: Payer: Self-pay | Admitting: Internal Medicine

## 2021-03-24 DIAGNOSIS — Z1152 Encounter for screening for COVID-19: Secondary | ICD-10-CM | POA: Diagnosis not present

## 2021-03-24 DIAGNOSIS — J069 Acute upper respiratory infection, unspecified: Secondary | ICD-10-CM

## 2021-03-24 MED ORDER — FLUTICASONE PROPIONATE 50 MCG/ACT NA SUSP: 1.0000 | Freq: Two times a day (BID) | NASAL | 2 refills | Status: DC

## 2021-03-24 MED ORDER — AMOXICILLIN-POT CLAVULANATE 875-125 MG PO TABS: 1.0000 | ORAL_TABLET | Freq: Two times a day (BID) | ORAL | 0 refills | Status: DC

## 2021-03-24 NOTE — Discharge Instructions (Signed)
Try Coricidin HBP, Flonase nasal spray, sinus rinses, over-the-counter cough syrup and Mucinex for the next 4 to 5 days.  If you continue to worsen over the duration of that time, you may then start the antibiotic that was sent over.  Try to avoid taking this as I do not think that at this time it will help with your symptoms as they appear viral.  Follow up with your primary care provider next week to recheck your symptoms.

## 2021-03-24 NOTE — ED Triage Notes (Signed)
Patient states that last Tuesday she thought she only had a cold.   Patient states that she is hoarse and has a little cough  Patient states that her head is hurting  Denies Fever

## 2021-03-25 ENCOUNTER — Other Ambulatory Visit: Payer: Self-pay | Admitting: Internal Medicine

## 2021-03-25 LAB — COVID-19, FLU A+B NAA
Influenza A, NAA: NOT DETECTED
Influenza B, NAA: NOT DETECTED
SARS-CoV-2, NAA: DETECTED — AB

## 2021-03-28 NOTE — ED Provider Notes (Signed)
[facility]    CSN: [id number] Arrival date & time: 03/24/21  1257      History   Chief Complaint Chief Complaint  Patient presents with   sinus issues    Sinus issues and head stopped up    HPI Ruth Roth is a 88 y.o. female.   Presenting today with several day history of hoarseness, cough, congestion. She denies fever, chills, CP, SOB, abdominal pain, N/V/D. So far trying OTC cold and congestion medications with minimal relief. Hx of seasonal allergies on atrovent, flonase.    Past Medical History:  Diagnosis Date   (HFpEF) heart failure with preserved ejection fraction ([place])    a. 07/2017 Echo: EF 50-55%, no rwma, mild AS/AI, mod MR, mod dil LA, mod TR, PASP 61mmHg.   Autoimmune hepatitis ([place])    followed by Dr [name]   Fibrocystic breast disease    Glaucoma    Hypercholesterolemia    Hypertension    Hypothyroidism    Inflammatory arthritis    MI (myocardial infarction) ([place])    Neuropathy    Non-obstructive Coronary artery disease    a. 05/2014 NSTEMI/Cath: LM nl, LAD mild dzs, LCX 60ost hazy (? culprit), RCA mild dzs. EF 60% by echo-->Med Rx; b. 08/2017 Cath: LM 30ost, LAD min irregs, LCX small, 40ost/p, RCA large, min irregs, RPDA/RPL1 nl, EF 50-55%. 4+MR.   Osteoarthritis    Permanent atrial fibrillation (HCC)    a. CHA2DS2VASc = 6-->Xarelto.   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 07/2017 Echo: Mod MR; b. 08/2017 Cath: 4+/Severe MR.    Patient Active Problem List   Diagnosis Date Noted   Bilateral hand swelling 09/09/2019   Bilateral hand pain 09/09/2019   SOB (shortness of breath) on exertion 07/13/2019   Chronic low back pain (Primary Area of Pain) (Bilateral) (L>R) w/o sciatica 03/17/2019   Chronic hip pain (Secondary area of Pain) (Bilateral) (R>L) 03/17/2019   Chronic lower extremity pain (Bilateral) 03/17/2019   Chronic anticoagulation (Xarelto)  03/17/2019   Lumbar spinal stenosis (L3-4, L4-5) w/ neurogenic claudication 03/17/2019   Abnormal MRI, lumbar spine 03/17/2019   Lumbar foraminal stenosis (L5-S1) (Bilateral) 03/17/2019   Lumbar facet hypertrophy 03/17/2019   Lumbar facet syndrome (Bilateral) 03/17/2019   Chronic pain syndrome 03/16/2019   Pharmacologic therapy 03/16/2019   Disorder of skeletal system 03/16/2019   Problems influencing health status 03/16/2019   Sinusitis 02/20/2019   CKD (chronic kidney disease) stage 3, GFR 30-59 ml/min (HCC) 07/26/2018   Cough 05/24/2018   Primary biliary cholangitis ([place]) 05/24/2018   Iron deficiency anemia 02/25/2018   Coronary artery disease 11/12/2017   Persistent atrial fibrillation ([place]) 11/12/2017   Severe mitral regurgitation 11/12/2017   Pulmonary hypertension, unspecified ([place]) 11/12/2017   Peripheral neuropathy 11/12/2017   Itching 10/05/2017   Acute on chronic diastolic CHF (congestive heart failure) ([place]) 95/63/8756   Acute diastolic CHF (congestive heart failure) ([place]) 08/21/2017   Dizziness 08/09/2016   Back pain 05/19/2016   Abnormal CXR 10/02/2014   Benign meningioma ([place]) 09/20/2014   Chronic diastolic heart failure ([place]) 05/24/2014   Acute on chronic diastolic (congestive) heart failure ([place]) 05/24/2014   Dyspnea 05/09/2014   Renal insufficiency 05/06/2014   Palpitations 04/17/2014   Health care maintenance 04/17/2014   Anemia 06/26/2013   Onychomycosis due to dermatophyte 06/26/2013   Exostosis 06/26/2013   Gastritis and gastroduodenitis 11/04/2012   GERD (gastroesophageal  reflux disease) 10/18/2012   Autoimmune hepatitis ([place]) 01/20/2012   Osteoporosis 01/20/2012   Benign hypertension 01/16/2012   Hypercholesteremia 01/16/2012   Hyperlipidemia 01/16/2012   Hypothyroidism 04/30/2011   Thyroiditis, lymphocytic 04/30/2011    Past Surgical History:  Procedure Laterality Date   ABDOMINAL HYSTERECTOMY  1963   partial, secondary to fibroids    APPENDECTOMY     Billroth     CARDIAC CATHETERIZATION  05/12/14   [place]   CARDIOVERSION N/A 12/26/2016   Procedure: CARDIOVERSION;  Surgeon: [name], MD;  Location: [facility];  Service: Cardiovascular;  Laterality: N/A;   CARDIOVERSION N/A 05/16/2017   Procedure: CARDIOVERSION;  Surgeon: [name], MD;  Location: [facility];  Service: Cardiovascular;  Laterality: N/A;   CARDIOVERSION N/A 06/09/2017   Procedure: CARDIOVERSION;  Surgeon: [name], MD;  Location: [facility];  Service: Cardiovascular;  Laterality: N/A;   CARDIOVERSION N/A 11/30/2018   Procedure: CARDIOVERSION;  Surgeon: [name], MD;  Location: [facility];  Service: Cardiovascular;  Laterality: N/A;   CHOLECYSTECTOMY  1998   COLONOSCOPY  2009   RIGHT/LEFT HEART CATH AND CORONARY ANGIOGRAPHY Bilateral 08/21/2017   Procedure: RIGHT/LEFT HEART CATH AND CORONARY ANGIOGRAPHY;  Surgeon: [name], MD;  Location: [facility];  Service: Cardiovascular;  Laterality: Bilateral;   TEE WITHOUT CARDIOVERSION N/A 08/25/2017   Procedure: TRANSESOPHAGEAL ECHOCARDIOGRAM (TEE);  Surgeon: Jolaine Artist, MD;  Location: [facility];  Service: Cardiovascular;  Laterality: N/A;   UPPER GI ENDOSCOPY  2004    OB History     Gravida  2   Para  2   Term      Preterm      AB      Living         SAB      IAB      Ectopic      Multiple      Live Births           Obstetric Comments  1st Menstrual Cycle:  15 1st Pregnancy:  21          Home Medications    Prior to Admission medications   Medication Sig Start Date End Date Taking? Authorizing Provider  amoxicillin-clavulanate (AUGMENTIN) 875-125 MG tablet Take 1 tablet by mouth every 12 (twelve) hours. 03/24/21  Yes Volney American, PA-C  fluticasone [facility]) 50 MCG/ACT nasal spray Place 1 spray into both nostrils 2 (two) times daily. 03/24/21  Yes Volney American, PA-C  allopurinol (ZYLOPRIM) 100 MG tablet TAKE 1/2  TABLET(50 MG) BY MOUTH DAILY 03/26/21   Einar Pheasant, MD  chlorhexidine (PERIDEX) 0.12 % solution SMARTSIG:By Mouth 02/07/21   [provider]  colchicine 0.6 MG tablet Take 1 tablet (0.6 mg total) by mouth daily as needed. Patient not taking: Reported on 03/19/2021 09/01/20   Bensimhon, Shaune Pascal, MD  CYANOCOBALAMIN PO Take 500 mcg by mouth once a week.    [provider]  diltiazem (CARDIZEM CD) 120 MG 24 hr capsule TAKE 1 CAPSULE(120 MG) BY MOUTH DAILY 03/26/21   Einar Pheasant, MD  dorzolamide (TRUSOPT) 2 % ophthalmic solution Place 1 drop into the left eye 2 (two) times daily.    [provider]  fluticasone (FLONASE) 50 MCG/ACT nasal spray Place 2 sprays into both nostrils daily as needed for allergies.  08/27/16   [provider]  gabapentin (NEURONTIN) 300 MG capsule One capsule tid and two capsules q hs. 01/28/20   Einar Pheasant, MD  hydroxychloroquine (PLAQUENIL) 200 MG tablet Take 200 mg by mouth every other day.    [provider]  ipratropium (ATROVENT) 0.03 % nasal spray SMARTSIG:2 Spray(s) Both Nares 3 Times Daily PRN 04/19/20   [provider]  latanoprost (XALATAN) 0.005 % ophthalmic solution Place 1 drop into both eyes at bedtime.  07/29/18   [provider]  levothyroxine (SYNTHROID) 112 MCG tablet TAKE 1 TABLET(112 MCG) BY MOUTH DAILY BEFORE BREAKFAST 12/04/20   Einar Pheasant, MD  metoprolol tartrate (LOPRESSOR) 100 MG tablet Take 1.5 tablets (150 mg total) by mouth 2 (two) times daily. 03/13/21   [name], MD  Multiple Vitamin (MULTI-VITAMIN) tablet Take 1 tablet by mouth at bedtime.     [provider]  pantoprazole (PROTONIX) 20 MG tablet TAKE 1 TABLET BY MOUTH EVERY DAY 10/17/20   Einar Pheasant, MD  torsemide (DEMADEX) 20 MG tablet ALTERNATE 2 TABLETS BY MOUTH ONE DAY AND 1 TABLET THE NEXT DAY. 06/13/20   [name], MD  traMADol (ULTRAM) 50 MG tablet Take 1 tablet (50 mg total) by mouth 2 (two)  times daily as needed (for pain.). 02/16/21 02/16/22  Einar Pheasant, MD  ursodiol (ACTIGALL) 300 MG capsule Take 300-600 mg by mouth See admin instructions. Take 2 capsules (600 mg) daily in the morning   & 1 capsule (300 mg) at night.    [provider]  XARELTO 15 MG TABS tablet TAKE 1 TABLET(15 MG) BY MOUTH DAILY WITH SUPPER 12/26/20   Bensimhon, Shaune Pascal, MD    Family History Family History  Problem Relation Age of Onset   Stroke Mother    Esophageal cancer Brother        also had lung cancer   Ovarian cancer Daughter    Colon cancer Daughter    Breast cancer Neg Hx     Social History Social History   Tobacco Use   Smoking status: Never   Smokeless tobacco: Never  Vaping Use   Vaping Use: Never used  Substance Use Topics   Alcohol use: No    Alcohol/week: 0.0 standard drinks   Drug use: No     Allergies   Statins, Haldol [haloperidol lactate], Librax [chlordiazepoxide-clidinium], Plavix [clopidogrel bisulfate], and Ramipril   Review of Systems Review of Systems PER HPI  Physical Exam Triage Vital Signs ED Triage Vitals  Enc Vitals Group     BP 03/24/21 1351 134/71     Pulse Rate 03/24/21 1351 63     Resp 03/24/21 1351 16     Temp 03/24/21 1351 97.6 F (36.4 C)     Temp Source 03/24/21 1351 Oral     SpO2 03/24/21 1351 96 %     Weight --      Height --      Head Circumference --      Peak Flow --      Pain Score 03/24/21 1352 7     Pain Loc --      Pain Edu? --      Excl. in [place]? --    No data found.  Updated Vital Signs BP 134/71 (BP Location: Right Arm)    Pulse 63    Temp 97.6 F (36.4 C) (Oral)    Resp 16    SpO2 96%   Visual Acuity Right Eye Distance:   Left Eye Distance:   Bilateral Distance:    Right Eye Near:   Left Eye Near:    Bilateral Near:  Physical Exam Vitals and nursing note reviewed.  Constitutional:      Appearance: Normal appearance.  HENT:     Head: Atraumatic.     Right Ear: Tympanic membrane and  external ear normal.     Left Ear: Tympanic membrane and external ear normal.     Nose: Congestion present.     Mouth/Throat:     Mouth: Mucous membranes are moist.     Pharynx: Posterior oropharyngeal erythema present.  Eyes:     Extraocular Movements: Extraocular movements intact.     Conjunctiva/sclera: Conjunctivae normal.  Cardiovascular:     Rate and Rhythm: Normal rate and regular rhythm.     Heart sounds: Normal heart sounds.  Pulmonary:     Effort: Pulmonary effort is normal.     Breath sounds: Normal breath sounds. No wheezing.  Musculoskeletal:        General: Normal range of motion.     Cervical back: Normal range of motion and neck supple.  Skin:    General: Skin is warm and dry.  Neurological:     Mental Status: She is alert and oriented to person, place, and time.  Psychiatric:        Mood and Affect: Mood normal.        Thought Content: Thought content normal.     UC Treatments / Results  Labs (all labs ordered are listed, but only abnormal results are displayed) Labs Reviewed  COVID-19, FLU A+B NAA - Abnormal; Notable for the following components:      Result Value   SARS-CoV-2, NAA Detected (*)    All other components within normal limits   Narrative:    Performed at:  [address], [place], [place]  [id number] Lab Director: Rush Farmer MD, Phone:  [phone]    EKG   Radiology No results found.  Procedures Procedures (including critical care time)  Medications Ordered in UC Medications - No data to display  Initial Impression / Assessment and Plan / UC Course  I have reviewed the triage vital signs and the nursing notes.  Pertinent labs & imaging results that were available during my care of the patient were reviewed by me and considered in my medical decision making (see chart for details).     Try coricidin, nasal sprays, sinus rinses. If not improving over next 4-5 days and if not improving, may start  antibiotic sent. Return for worsening sxs. COVID and flu test pending.   Final Clinical Impressions(s) / UC Diagnoses   Final diagnoses:  Encounter for screening for COVID-19  Viral URI with cough     Discharge Instructions      Try Coricidin HBP, Flonase nasal spray, sinus rinses, over-the-counter cough syrup and Mucinex for the next 4 to 5 days.  If you continue to worsen over the duration of that time, you may then start the antibiotic that was sent over.  Try to avoid taking this as I do not think that at this time it will help with your symptoms as they appear viral.  Follow up with your primary care provider next week to recheck your symptoms.    ED Prescriptions     Medication Sig Dispense Auth. Provider   fluticasone (FLONASE) 50 MCG/ACT nasal spray Place 1 spray into both nostrils 2 (two) times daily. 16 g Volney American, PA-C   amoxicillin-clavulanate (AUGMENTIN) 875-125 MG tablet Take 1 tablet by mouth every 12 (twelve) hours. 14 tablet Volney American, [place]  PDMP not reviewed this encounter.   Volney American, [place] 03/28/21 2316

## 2021-04-10 ENCOUNTER — Other Ambulatory Visit (HOSPITAL_COMMUNITY): Payer: Self-pay

## 2021-04-10 ENCOUNTER — Encounter: Payer: Self-pay | Admitting: Oncology

## 2021-04-13 DIAGNOSIS — H401132 Primary open-angle glaucoma, bilateral, moderate stage: Secondary | ICD-10-CM | POA: Diagnosis not present

## 2021-04-19 ENCOUNTER — Other Ambulatory Visit: Payer: Self-pay | Admitting: Internal Medicine

## 2021-04-24 DIAGNOSIS — M810 Age-related osteoporosis without current pathological fracture: Secondary | ICD-10-CM | POA: Diagnosis not present

## 2021-04-30 ENCOUNTER — Other Ambulatory Visit: Payer: Self-pay | Admitting: Internal Medicine

## 2021-05-07 DIAGNOSIS — Z20822 Contact with and (suspected) exposure to covid-19: Secondary | ICD-10-CM | POA: Diagnosis not present

## 2021-05-08 DIAGNOSIS — H401132 Primary open-angle glaucoma, bilateral, moderate stage: Secondary | ICD-10-CM | POA: Diagnosis not present

## 2021-05-16 DIAGNOSIS — Z20828 Contact with and (suspected) exposure to other viral communicable diseases: Secondary | ICD-10-CM | POA: Diagnosis not present

## 2021-05-17 ENCOUNTER — Ambulatory Visit: Payer: Medicare Other

## 2021-05-18 ENCOUNTER — Other Ambulatory Visit: Payer: Self-pay

## 2021-05-18 ENCOUNTER — Ambulatory Visit (INDEPENDENT_AMBULATORY_CARE_PROVIDER_SITE_OTHER): Payer: Medicare Other | Admitting: Internal Medicine

## 2021-05-18 DIAGNOSIS — M810 Age-related osteoporosis without current pathological fracture: Secondary | ICD-10-CM | POA: Diagnosis not present

## 2021-05-18 DIAGNOSIS — E039 Hypothyroidism, unspecified: Secondary | ICD-10-CM

## 2021-05-18 DIAGNOSIS — K743 Primary biliary cirrhosis: Secondary | ICD-10-CM | POA: Diagnosis not present

## 2021-05-18 DIAGNOSIS — K219 Gastro-esophageal reflux disease without esophagitis: Secondary | ICD-10-CM | POA: Diagnosis not present

## 2021-05-18 DIAGNOSIS — E7800 Pure hypercholesterolemia, unspecified: Secondary | ICD-10-CM

## 2021-05-18 DIAGNOSIS — D329 Benign neoplasm of meninges, unspecified: Secondary | ICD-10-CM

## 2021-05-18 DIAGNOSIS — D500 Iron deficiency anemia secondary to blood loss (chronic): Secondary | ICD-10-CM | POA: Diagnosis not present

## 2021-05-18 DIAGNOSIS — I10 Essential (primary) hypertension: Secondary | ICD-10-CM

## 2021-05-18 DIAGNOSIS — I5032 Chronic diastolic (congestive) heart failure: Secondary | ICD-10-CM

## 2021-05-18 DIAGNOSIS — I272 Pulmonary hypertension, unspecified: Secondary | ICD-10-CM | POA: Diagnosis not present

## 2021-05-18 DIAGNOSIS — K754 Autoimmune hepatitis: Secondary | ICD-10-CM | POA: Diagnosis not present

## 2021-05-18 DIAGNOSIS — I4819 Other persistent atrial fibrillation: Secondary | ICD-10-CM | POA: Diagnosis not present

## 2021-05-18 DIAGNOSIS — M25562 Pain in left knee: Secondary | ICD-10-CM

## 2021-05-18 DIAGNOSIS — N1832 Chronic kidney disease, stage 3b: Secondary | ICD-10-CM

## 2021-05-18 MED ORDER — LEVOTHYROXINE SODIUM 112 MCG PO TABS: ORAL_TABLET | ORAL | 2 refills | Status: DC

## 2021-05-18 MED ORDER — TRAMADOL HCL 50 MG PO TABS: 50.0000 mg | ORAL_TABLET | Freq: Two times a day (BID) | ORAL | 0 refills | Status: DC | PRN

## 2021-05-18 NOTE — Progress Notes (Signed)
[name], female   DOB: 03-21-1928, 88 y.o.   MRN: [id number]   Subjective:    [name], female    DOB: February 01, 1929, 88 y.o.   MRN: [id number]  This visit occurred during the SARS-CoV-2 public health emergency.  Safety protocols were in place, including screening questions prior to the visit, additional usage of staff PPE, and extensive cleaning of exam room while observing appropriate contact time as indicated for disinfecting solutions.   Patient here for a scheduled follow up.   Chief Complaint  Patient presents with   Gastroesophageal Reflux   Hypertension   Hyperlipidemia   .   HPI On xarelto for afib.  Seeing Dr Tasia Catchings for [place].  Most recent cbc - normal hgb.  I V iron held 03/19/21.  Continues on oral B12.  Is s/p injection - knee - seeing Dr Jefm Bryant.  Injection helped.  Still with pain - left knee and extends down into her left lateral calf.  Plans to discuss with Dr Lavonne Chick at her scheduled follow up appt.  Seeing cardiology - afib - rate controlled.  She is now on metoprolol and diltiazem.  Feels better.  Denies increased heart rate or palpitations.  Breathing stable.  No chest pain.  No abdominal pain.  Intermittent flares of diarrhea.  Discussed starting fiber.     Past Medical History:  Diagnosis Date   (HFpEF) heart failure with preserved ejection fraction ([place])    a. 07/2017 Echo: EF 50-55%, no rwma, mild AS/AI, mod MR, mod dil LA, mod TR, PASP 67mHg.   Autoimmune hepatitis ([place]    followed by Dr SGustavo Lah  Fibrocystic breast disease    Glaucoma    Hypercholesterolemia    Hypertension    Hypothyroidism    Inflammatory arthritis    MI (myocardial infarction) ([place]    Neuropathy    Non-obstructive Coronary artery disease    a. 05/2014 NSTEMI/Cath: LM nl, LAD mild dzs, LCX 60ost hazy (? culprit), RCA mild dzs. EF 60% by echo-->Med Rx; b. 08/2017 Cath: LM 30ost, LAD min irregs, LCX small, 40ost/p, RCA large, min irregs, RPDA/RPL1 nl, EF 50-55%. 4+MR.    Osteoarthritis    Permanent atrial fibrillation (HCC)    a. CHA2DS2VASc = 6-->Xarelto.   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 07/2017 Echo: Mod MR; b. 08/2017 Cath: 4+/Severe MR.   Past Surgical History:  Procedure Laterality Date   ABDOMINAL HYSTERECTOMY  1963   partial, secondary to fibroids   APPENDECTOMY     Billroth     CARDIAC CATHETERIZATION  05/12/14   [place]  CARDIOVERSION N/A 12/26/2016   Procedure: CARDIOVERSION;  Surgeon: AWellington Hampshire MD;  Location: [facility] ORS;  Service: Cardiovascular;  Laterality: N/A;   CARDIOVERSION N/A 05/16/2017   Procedure: CARDIOVERSION;  Surgeon: AWellington Hampshire MD;  Location: [facility];  Service: Cardiovascular;  Laterality: N/A;   CARDIOVERSION N/A 06/09/2017   Procedure: CARDIOVERSION;  Surgeon: AWellington Hampshire MD;  Location: [facility];  Service: Cardiovascular;  Laterality: N/A;   CARDIOVERSION N/A 11/30/2018   Procedure: CARDIOVERSION;  Surgeon: AWellington Hampshire MD;  Location: [facility];  Service: Cardiovascular;  Laterality: N/A;   CHOLECYSTECTOMY  1998   COLONOSCOPY  2009   RIGHT/LEFT HEART CATH AND CORONARY ANGIOGRAPHY Bilateral 08/21/2017   Procedure: RIGHT/LEFT HEART CATH AND CORONARY ANGIOGRAPHY;  Surgeon: AWellington Hampshire MD;  Location: [facility];  Service: Cardiovascular;  Laterality: Bilateral;   TEE WITHOUT CARDIOVERSION N/A 08/25/2017   Procedure: TRANSESOPHAGEAL ECHOCARDIOGRAM (TEE);  Surgeon: Jolaine Artist, MD;  Location: [facility] ENDOSCOPY;  Service: Cardiovascular;  Laterality: N/A;   UPPER GI ENDOSCOPY  2004   Family History  Problem Relation Age of Onset   Stroke Mother    Esophageal cancer Brother        also had lung cancer   Ovarian cancer Daughter    Colon cancer Daughter    Breast cancer Neg Hx    Social History   Socioeconomic History   Marital status: Widowed    Spouse name: Not on file   Number of  children: 2   Years of education: Not on file   Highest education level: Not on file  Occupational History   Occupation: retired  Tobacco Use   Smoking status: Never   Smokeless tobacco: Never  Vaping Use   Vaping Use: Never used  Substance and Sexual Activity   Alcohol use: No    Alcohol/week: 0.0 standard drinks   Drug use: No   Sexual activity: Never  Other Topics Concern   Not on file  Social History Narrative   Not on file   Social Determinants of Health   Financial Resource Strain: Not on file  Food Insecurity: Not on file  Transportation Needs: Not on file  Physical Activity: Not on file  Stress: Not on file  Social Connections: Not on file     Review of Systems  Constitutional:  Negative for appetite change and unexpected weight change.  HENT:  Negative for congestion and sinus pressure.   Respiratory:  Negative for cough and chest tightness.        Breathing stable.   Cardiovascular:  Negative for chest pain, palpitations and leg swelling.  Gastrointestinal:  Negative for abdominal pain, diarrhea, nausea and vomiting.  Genitourinary:  Negative for difficulty urinating and dysuria.  Musculoskeletal:  Negative for joint swelling and myalgias.  Skin:  Negative for color change and rash.  Neurological:  Negative for dizziness, light-headedness and headaches.  Psychiatric/Behavioral:  Negative for agitation and dysphoric mood.       Objective:     BP 130/72    Pulse 82    Temp 97.9 F (36.6 C)    Resp 16    Wt 139 lb (63 kg)    SpO2 99%    BMI 25.42 kg/m  Wt Readings from Last 3 Encounters:  05/18/21 139 lb (63 kg)  03/19/21 135 lb 1.6 oz (61.3 kg)  03/16/21 136 lb 3.2 oz (61.8 kg)    Physical Exam Vitals reviewed.  Constitutional:      General: She is not in acute distress.    Appearance: Normal appearance.  HENT:     Head: Normocephalic and atraumatic.     Right Ear: External ear normal.     Left Ear: External ear normal.  Eyes:     General: No  scleral icterus.       Right eye: No discharge.        Left eye: No discharge.     Conjunctiva/sclera: Conjunctivae normal.  Neck:     Thyroid: No thyromegaly.  Cardiovascular:     Rate and Rhythm: Normal rate and regular rhythm.  Pulmonary:     Effort: No respiratory distress.     Breath sounds: Normal breath sounds. No wheezing.  Abdominal:     General: Bowel sounds are normal.  Palpations: Abdomen is soft.     Tenderness: There is no abdominal tenderness.  Musculoskeletal:        General: No swelling or tenderness.     Cervical back: Neck supple. No tenderness.  Lymphadenopathy:     Cervical: No cervical adenopathy.  Skin:    Findings: No erythema or rash.  Neurological:     Mental Status: She is alert.  Psychiatric:        Mood and Affect: Mood normal.        Behavior: Behavior normal.     Outpatient Encounter Medications as of 05/18/2021  Medication Sig   allopurinol (ZYLOPRIM) 100 MG tablet TAKE 1/2 TABLET(50 MG) BY MOUTH DAILY   chlorhexidine (PERIDEX) 0.12 % solution SMARTSIG:By Mouth   CYANOCOBALAMIN PO Take 500 mcg by mouth once a week.   diltiazem (CARDIZEM CD) 120 MG 24 hr capsule TAKE 1 CAPSULE(120 MG) BY MOUTH DAILY   dorzolamide (TRUSOPT) 2 % ophthalmic solution Place 1 drop into the left eye 2 (two) times daily.   fluticasone (FLONASE) 50 MCG/ACT nasal spray Place 2 sprays into both nostrils daily as needed for allergies.    fluticasone (FLONASE) 50 MCG/ACT nasal spray Place 1 spray into both nostrils 2 (two) times daily.   gabapentin (NEURONTIN) 300 MG capsule One capsule tid and two capsules q hs.   hydroxychloroquine (PLAQUENIL) 200 MG tablet Take 200 mg by mouth every other day.   ipratropium (ATROVENT) 0.03 % nasal spray SMARTSIG:2 Spray(s) Both Nares 3 Times Daily PRN   latanoprost (XALATAN) 0.005 % ophthalmic solution Place 1 drop into both eyes at bedtime.    levothyroxine (SYNTHROID) 112 MCG tablet TAKE 1 TABLET(112 MCG) BY MOUTH DAILY BEFORE  BREAKFAST   metoprolol tartrate (LOPRESSOR) 100 MG tablet Take 1.5 tablets (150 mg total) by mouth 2 (two) times daily.   Multiple Vitamin (MULTI-VITAMIN) tablet Take 1 tablet by mouth at bedtime.    pantoprazole (PROTONIX) 20 MG tablet TAKE 1 TABLET BY MOUTH EVERY DAY   torsemide (DEMADEX) 20 MG tablet ALTERNATE 2 TABLETS BY MOUTH ONE DAY AND 1 TABLET THE NEXT DAY.   traMADol (ULTRAM) 50 MG tablet Take 1 tablet (50 mg total) by mouth 2 (two) times daily as needed (for pain.).   ursodiol (ACTIGALL) 300 MG capsule Take 300-600 mg by mouth See admin instructions. Take 2 capsules (600 mg) daily in the morning   & 1 capsule (300 mg) at night.   XARELTO 15 MG TABS tablet TAKE 1 TABLET(15 MG) BY MOUTH DAILY WITH SUPPER   [DISCONTINUED] amoxicillin-clavulanate (AUGMENTIN) 875-125 MG tablet Take 1 tablet by mouth every 12 (twelve) hours.   [DISCONTINUED] colchicine 0.6 MG tablet Take 1 tablet (0.6 mg total) by mouth daily as needed. (Patient not taking: Reported on 03/19/2021)   [DISCONTINUED] levothyroxine (SYNTHROID) 112 MCG tablet TAKE 1 TABLET(112 MCG) BY MOUTH DAILY BEFORE BREAKFAST   [DISCONTINUED] traMADol (ULTRAM) 50 MG tablet Take 1 tablet (50 mg total) by mouth 2 (two) times daily as needed (for pain.).   No facility-administered encounter medications on file as of 05/18/2021.     Lab Results  Component Value Date   WBC 5.8 03/16/2021   HGB 13.2 03/16/2021   HCT 40.5 03/16/2021   PLT 167 03/16/2021   GLUCOSE 86 03/16/2021   CHOL 143 06/22/2020   TRIG 139.0 06/22/2020   HDL 48.40 06/22/2020   LDLDIRECT 143.5 12/17/2012   LDLCALC 67 06/22/2020   ALT 13 06/22/2020   AST 11 06/22/2020  NA 137 03/16/2021   K 4.2 03/16/2021   CL 107 03/16/2021   CREATININE 1.30 (H) 03/16/2021   BUN 40 (H) 03/16/2021   CO2 22 03/16/2021   TSH 2.12 06/22/2020   INR 1.63 12/29/2017   HGBA1C 5.6 01/27/2018       Assessment & Plan:   Problem List Items Addressed This Visit     Autoimmune hepatitis  ([place])    Follow liver function tests.  Followed by GI.       Benign hypertension    Continue losartan, diltiazem and metoprolol.  Blood pressure controlled.  Follow pressure.  Follow metabolic panel.       Benign meningioma ([place])    Evaluated by Dr Jorene Minors (oncology [facility]).  States was told no further w/up or f/u neded.  Of note, no mention on MRI 2018.       Chronic diastolic heart failure (HCC)    Continue torsemide and metoprolol.  Continue xarelto.  No evidence of volume overload on exam.  Breathing stable.       CKD (chronic kidney disease) stage 3, GFR 30-59 ml/min (HCC)    Avoid antiinflammatories.  Follow metabolic panel. Continues on losartan.       GERD (gastroesophageal reflux disease)    Upper symptoms controlled.  On protonix.       Hypercholesteremia    Unable to take statin medication.  Follow lipid panel.       Hypothyroidism    On thyroid replacement.  Follow tsh.       Relevant Medications   levothyroxine (SYNTHROID) 112 MCG tablet   Iron deficiency anemia    Seeing Dr Tasia Catchings.  Last hgb wnl.  Follow cbc and iron studies.       Left knee pain    S/p injection.  Plan f/u with Dr Lavonne Chick.       Osteoporosis    prolia - last 10/11/20. Overdue.  Need to schedule      Persistent atrial fibrillation (HCC)    On metoprolol and diltiazem.  Rated controlled.  Continue xarelto.  Continue f/u with cardiology.        Primary biliary cholangitis (HCC)    Followed by GI. Stable. Follow liver function tests.        Pulmonary hypertension, unspecified ([place])    Followed by cardiology.         Einar Pheasant, MD

## 2021-05-21 ENCOUNTER — Ambulatory Visit: Payer: Medicare Other | Admitting: Internal Medicine

## 2021-05-21 ENCOUNTER — Encounter: Payer: Self-pay | Admitting: Internal Medicine

## 2021-05-21 DIAGNOSIS — M25562 Pain in left knee: Secondary | ICD-10-CM | POA: Insufficient documentation

## 2021-05-21 NOTE — Assessment & Plan Note (Signed)
Continue torsemide and metoprolol.  Continue xarelto.  No evidence of volume overload on exam.  Breathing stable.  ?

## 2021-05-21 NOTE — Assessment & Plan Note (Signed)
Continue losartan, diltiazem and metoprolol.  Blood pressure controlled.  Follow pressure.  Follow metabolic panel.  ?

## 2021-05-21 NOTE — Assessment & Plan Note (Signed)
On thyroid replacement.  Follow tsh.  

## 2021-05-21 NOTE — Assessment & Plan Note (Signed)
Upper symptoms controlled.  On protonix.  

## 2021-05-21 NOTE — Assessment & Plan Note (Signed)
S/p injection.  Plan f/u with Dr Lavonne Chick.  ?

## 2021-05-21 NOTE — Assessment & Plan Note (Signed)
On metoprolol and diltiazem.  Rated controlled.  Continue xarelto.  Continue f/u with cardiology.   ?

## 2021-05-21 NOTE — Assessment & Plan Note (Signed)
Evaluated by Dr Allen Friedman (oncology [facility]).  States was told no further w/up or f/u neded.  Of note, no mention on MRI 2018.  

## 2021-05-21 NOTE — Assessment & Plan Note (Signed)
Followed by cardiology 

## 2021-05-21 NOTE — Assessment & Plan Note (Addendum)
Avoid antiinflammatories.  Follow metabolic panel. Continues on losartan.  ?

## 2021-05-21 NOTE — Assessment & Plan Note (Signed)
Followed by GI.  Stable.  Follow liver function tests.  

## 2021-05-21 NOTE — Assessment & Plan Note (Signed)
Unable to take statin medication.  Follow lipid panel.  

## 2021-05-21 NOTE — Assessment & Plan Note (Signed)
prolia - last 10/11/20. Overdue.  Need to schedule ?

## 2021-05-21 NOTE — Assessment & Plan Note (Signed)
Seeing Dr Yu.  Last hgb wnl.  Follow cbc and iron studies.  

## 2021-05-21 NOTE — Assessment & Plan Note (Signed)
Follow liver function tests.  Followed by GI.  

## 2021-05-23 DIAGNOSIS — H401132 Primary open-angle glaucoma, bilateral, moderate stage: Secondary | ICD-10-CM | POA: Diagnosis not present

## 2021-05-26 ENCOUNTER — Other Ambulatory Visit: Payer: Self-pay | Admitting: Internal Medicine

## 2021-05-28 ENCOUNTER — Other Ambulatory Visit: Payer: Self-pay

## 2021-05-28 ENCOUNTER — Ambulatory Visit (INDEPENDENT_AMBULATORY_CARE_PROVIDER_SITE_OTHER): Payer: Medicare Other

## 2021-05-28 VITALS — BP 138/82 | HR 91 | Temp 97.1°F | Resp 15 | Ht 62.0 in | Wt 140.0 lb

## 2021-05-28 DIAGNOSIS — Z0000 Encounter for general adult medical examination without abnormal findings: Secondary | ICD-10-CM

## 2021-05-28 NOTE — Progress Notes (Signed)
Subjective:   Ruth Roth is a 88 y.o. female who presents for Medicare Annual (Subsequent) preventive examination.  Review of Systems    No ROS.  Medicare Wellness Virtual Visit.  See social history for additional risk factors.   Cardiac Risk Factors include: advanced age (>10men, >56 women)     Objective:    Today's Vitals   05/28/21 1449  BP: 138/82  Pulse: 91  Resp: 15  Temp: (!) 97.1 F (36.2 C)  SpO2: 97%  Weight: 140 lb (63.5 kg)  Height: 5\' 2"  (1.575 m)   Body mass index is 25.61 kg/m.  Advanced Directives 05/28/2021 05/16/2020 05/09/2020 01/05/2020 07/09/2019 05/13/2019 03/16/2019  Does Patient Have a Medical Advance Directive? Yes Yes Yes Yes Yes Yes Yes  Type of Advertising copywriter Living will;Healthcare Power of [employer] Power of Attorney Living will;Healthcare Power of Attorney Healthcare Power of Attorney Living will;Healthcare Power of Attorney  Does patient want to make changes to medical advance directive? No - Patient declined No - Patient declined - - - No - Patient declined -  Copy of Healthcare Power of Attorney in Chart? No - copy requested No - copy requested - - No - copy requested No - copy requested -  Would patient like information on creating a medical advance directive? - - - - - - -    Current Medications (verified) Outpatient Encounter Medications as of 05/28/2021  Medication Sig   allopurinol (ZYLOPRIM) 100 MG tablet TAKE 1/2 TABLET(50 MG) BY MOUTH DAILY   chlorhexidine (PERIDEX) 0.12 % solution SMARTSIG:By Mouth   CYANOCOBALAMIN PO Take 500 mcg by mouth once a week.   diltiazem (CARDIZEM CD) 120 MG 24 hr capsule TAKE 1 CAPSULE(120 MG) BY MOUTH DAILY   dorzolamide (TRUSOPT) 2 % ophthalmic solution Place 1 drop into the left eye 2 (two) times daily.   fluticasone (FLONASE) 50 MCG/ACT nasal spray Place 2 sprays into both nostrils daily as needed for allergies.    fluticasone (FLONASE)  50 MCG/ACT nasal spray Place 1 spray into both nostrils 2 (two) times daily.   gabapentin (NEURONTIN) 300 MG capsule One capsule tid and two capsules q hs.   hydroxychloroquine (PLAQUENIL) 200 MG tablet Take 200 mg by mouth every other day.   ipratropium (ATROVENT) 0.03 % nasal spray SMARTSIG:2 Spray(s) Both Nares 3 Times Daily PRN   latanoprost (XALATAN) 0.005 % ophthalmic solution Place 1 drop into both eyes at bedtime.    levothyroxine (SYNTHROID) 112 MCG tablet TAKE 1 TABLET(112 MCG) BY MOUTH DAILY BEFORE BREAKFAST   metoprolol tartrate (LOPRESSOR) 100 MG tablet Take 1.5 tablets (150 mg total) by mouth 2 (two) times daily.   Multiple Vitamin (MULTI-VITAMIN) tablet Take 1 tablet by mouth at bedtime.    pantoprazole (PROTONIX) 20 MG tablet TAKE 1 TABLET BY MOUTH EVERY DAY   torsemide (DEMADEX) 20 MG tablet ALTERNATE 2 TABLETS BY MOUTH ONE DAY AND 1 TABLET THE NEXT DAY.   traMADol (ULTRAM) 50 MG tablet Take 1 tablet (50 mg total) by mouth 2 (two) times daily as needed (for pain.).   ursodiol (ACTIGALL) 300 MG capsule Take 300-600 mg by mouth See admin instructions. Take 2 capsules (600 mg) daily in the morning   & 1 capsule (300 mg) at night.   XARELTO 15 MG TABS tablet TAKE 1 TABLET(15 MG) BY MOUTH DAILY WITH SUPPER   No facility-administered encounter medications on file as of 05/28/2021.    Allergies (verified)  Statins, Haldol [haloperidol lactate], Librax [chlordiazepoxide-clidinium], Plavix [clopidogrel bisulfate], and Ramipril   History: Past Medical History:  Diagnosis Date   (HFpEF) heart failure with preserved ejection fraction (HCC)    a. 07/2017 Echo: EF 50-55%, no rwma, mild AS/AI, mod MR, mod dil LA, mod TR, PASP .   Autoimmune hepatitis (HCC)    followed by Dr Marva Panda   Fibrocystic breast disease    Glaucoma    Hypercholesterolemia    Hypertension    Hypothyroidism    Inflammatory arthritis    MI (myocardial infarction) (HCC)    Neuropathy    Non-obstructive  Coronary artery disease    a. 05/2014 NSTEMI/Cath: LM nl, LAD mild dzs, LCX 60ost hazy (? culprit), RCA mild dzs. EF 60% by echo-->Med Rx; b. 08/2017 Cath: LM 30ost, LAD min irregs, LCX small, 40ost/p, RCA large, min irregs, RPDA/RPL1 nl, EF 50-55%. 4+MR.   Osteoarthritis    Permanent atrial fibrillation (HCC)    a. CHA2DS2VASc = 6-->Xarelto.   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 07/2017 Echo: Mod MR; b. 08/2017 Cath: 4+/Severe MR.   Past Surgical History:  Procedure Laterality Date   ABDOMINAL HYSTERECTOMY  1963   partial, secondary to fibroids   APPENDECTOMY     Billroth     CARDIAC CATHETERIZATION  05/12/14   [facility]   CARDIOVERSION N/A 12/26/2016   Procedure: CARDIOVERSION;  Surgeon: Iran Ouch, MD;  Location: [facility] ORS;  Service: Cardiovascular;  Laterality: N/A;   CARDIOVERSION N/A 05/16/2017   Procedure: CARDIOVERSION;  Surgeon: Iran Ouch, MD;  Location: [facility] ORS;  Service: Cardiovascular;  Laterality: N/A;   CARDIOVERSION N/A 06/09/2017   Procedure: CARDIOVERSION;  Surgeon: Iran Ouch, MD;  Location: [facility] ORS;  Service: Cardiovascular;  Laterality: N/A;   CARDIOVERSION N/A 11/30/2018   Procedure: CARDIOVERSION;  Surgeon: Iran Ouch, MD;  Location: [facility] ORS;  Service: Cardiovascular;  Laterality: N/A;   CHOLECYSTECTOMY  1998   COLONOSCOPY  2009   RIGHT/LEFT HEART CATH AND CORONARY ANGIOGRAPHY Bilateral 08/21/2017   Procedure: RIGHT/LEFT HEART CATH AND CORONARY ANGIOGRAPHY;  Surgeon: Iran Ouch, MD;  Location: [facility] INVASIVE CV LAB;  Service: Cardiovascular;  Laterality: Bilateral;   TEE WITHOUT CARDIOVERSION N/A 08/25/2017   Procedure: TRANSESOPHAGEAL ECHOCARDIOGRAM (TEE);  Surgeon: Dolores Patty, MD;  Location[facility]COPY;  Service: Cardiovascular;  Laterality: N/A;   UPPER GI ENDOSCOPY  2004   Family History  Problem Relation Age of Onset   Stroke Mother     Esophageal cancer Brother        also had lung cancer   Ovarian cancer Daughter    Colon cancer Daughter    Breast cancer Neg Hx    Social History   Socioeconomic History   Marital status: Widowed    Spouse name: Not on file   Number of children: 2   Years of education: Not on file   Highest education level: Not on file  Occupational History   Occupation: retired  Tobacco Use   Smoking status: Never   Smokeless tobacco: Never  Vaping Use   Vaping Use: Never used  Substance and Sexual Activity   Alcohol use: No    Alcohol/week: 0.0 standard drinks   Drug use: No   Sexual activity: Never  Other Topics Concern   Not on file  Social History Narrative   Not on file   Social Determinants of  Health   Financial Resource Strain: Low Risk    Difficulty of Paying Living Expenses: Not hard at all  Food Insecurity: No Food Insecurity   Worried About Running Out of Food in the Last Year: Never true   Ran Out of Food in the Last Year: Never true  Transportation Needs: No Transportation Needs   Lack of Transportation (Medical): No   Lack of Transportation (Non-Medical): No  Physical Activity: Insufficiently Active   Days of Exercise per Week: 3 days   Minutes of Exercise per Session: 40 min  Stress: No Stress Concern Present   Feeling of Stress : Not at all  Social Connections: Unknown   Frequency of Communication with Friends and Family: More than three times a week   Frequency of Social Gatherings with Friends and Family: More than three times a week   Attends Religious Services: Not on Scientist, clinical (histocompatibility and immunogenetics) or Organizations: Not on file   Attends Banker Meetings: Not on file   Marital Status: Not on file    Tobacco Counseling Counseling given: Not Answered   Clinical Intake:  Pre-visit preparation completed: Yes        Diabetes: No  How often do you need to have someone help you when you read instructions, pamphlets, or other written  materials from your doctor or pharmacy?: 1 - Never    Interpreter Needed?: No      Activities of Daily Living In your present state of health, do you have any difficulty performing the following activities: 05/28/2021  Hearing? N  Vision? N  Difficulty concentrating or making decisions? N  Walking or climbing stairs? N  Dressing or bathing? N  Doing errands, shopping? N  Preparing Food and eating ? N  Using the Toilet? N  In the past six months, have you accidently leaked urine? N  Do you have problems with loss of bowel control? N  Managing your Medications? N  Managing your Finances? N  Housekeeping or managing your Housekeeping? N  Some recent data might be hidden    Patient Care Team: Dale , MD as PCP - General (Unknown Physician Specialty) Iran Ouch, MD as PCP - Cardiology (Cardiology) Bensimhon, Bevelyn Buckles, MD as PCP - Advanced Heart Failure (Cardiology) Lemar Livings Merrily Pew, MD (General Surgery) Rickard Patience, MD as Consulting Physician (Oncology)  Indicate any recent Medical Services you may have received from other than [facility] providers in the past year (date may be approximate).     Assessment:   This is a routine wellness examination for [place].  Hearing/Vision screen Hearing Screening - Comments:: Patient is able to hear conversational tones without difficulty. No issues reported.  Vision Screening - Comments:: Followed by [facility]  Wears corrective lenses Cataract extraction, bilateral They have regular follow up with the ophthalmologist   Dietary issues and exercise activities discussed: Current Exercise Habits: Home exercise routine, Type of exercise: walking, Intensity: Mild   Goals Addressed               This Visit's Progress     Patient Stated     Maintain Healthy Lifestyle (pt-stated)        Stay active  Healthy diet Stay hydrated       Depression Screen PHQ 2/9 Scores 05/28/2021 02/16/2021 09/07/2020 05/16/2020  05/13/2019 05/22/2018 04/23/2018  PHQ - 2 Score 0 0 0 0 0 0 0  PHQ- 9 Score - - - - - 0 -  Fall Risk Fall Risk  02/16/2021 09/07/2020 05/16/2020 05/13/2019 04/23/2018  Falls in the past year? 0 0 0 0 0  Number falls in past yr: 0 0 0 - -  Injury with Fall? 0 0 0 - -  Risk Factor Category  - - - - -  Risk for fall due to : No Fall Risks - - - -  Risk for fall due to: Comment - - - - -  Follow up Falls evaluation completed Falls evaluation completed Falls evaluation completed Falls evaluation completed -    FALL RISK PREVENTION PERTAINING TO THE HOME: Home free of loose throw rugs in walkways, pet beds, electrical cords, etc? Yes  Adequate lighting in your home to reduce risk of falls? Yes   ASSISTIVE DEVICES UTILIZED TO PREVENT FALLS: Life alert? No  Use of a cane, walker or w/c? No   TIMED UP AND GO: Was the test performed? Yes . Ambulates without assistance. Steady gait. Moderate pace.   Cognitive Function: Patient is alert and oriented x3.  MMSE - Mini Mental State Exam 04/19/2015  Orientation to time 5  Orientation to Place 5  Registration 3  Attention/ Calculation 5  Recall 3  Language- name 2 objects 2  Language- repeat 1  Language- follow 3 step command 3  Language- read & follow direction 1  Write a sentence 1  Copy design 1  Total score 30     6CIT Screen 05/28/2021 05/16/2020 05/13/2019 04/23/2018 04/21/2017  What Year? 0 points 0 points 0 points 0 points 0 points  What month? 0 points 0 points 0 points 0 points 0 points  What time? 0 points 0 points 0 points 0 points 0 points  Count back from 20 0 points 0 points 0 points 0 points 0 points  Months in reverse 0 points 0 points 0 points 0 points 0 points  Repeat phrase 0 points - 0 points 0 points 0 points  Total Score 0 - 0 0 0    Immunizations Immunization History  Administered Date(s) Administered   Fluad Quad(high Dose 65+) 02/12/2019, 01/28/2020, 02/16/2021   Hepb-cpg 07/13/2019   Influenza, High Dose Seasonal PF  01/23/2016, 01/21/2017, 01/30/2018   Influenza,inj,Quad PF,6+ Mos 12/24/2012, 01/11/2014, 01/16/2015   Influenza-Unspecified 12/10/2011   Moderna Sars-Covid-2 Vaccination 03/23/2019, 04/24/2019, 01/10/2020, 08/23/2020   Pneumococcal Conjugate-13 02/22/2014   Pneumococcal Polysaccharide-23 07/30/2016   Td 08/17/2010   Screening Tests Health Maintenance  Topic Date Due   COVID-19 Vaccine (5 - Booster for Moderna series) 06/03/2021 (Originally 10/18/2020)   TETANUS/TDAP  09/07/2021 (Originally 08/16/2020)   Zoster Vaccines- Shingrix (1 of 2) 12/08/2021 (Originally 05/28/1947)   MAMMOGRAM  11/01/2021   Pneumonia Vaccine 98+ Years old  Completed   INFLUENZA VACCINE  Completed   DEXA SCAN  Completed   HPV VACCINES  Aged Out   Health Maintenance There are no preventive care reminders to display for this patient.  Lung Cancer Screening: (Low Dose CT Chest recommended if Age 82-80 years, 30 pack-year currently smoking OR have quit w/in 15years.) does not qualify.   Hepatitis C Screening: does not qualify  Vision Screening: Recommended annual ophthalmology exams for early detection of glaucoma and other disorders of the eye.  Dental Screening: Recommended annual dental exams for proper oral hygiene  Community Resource Referral / Chronic Care Management: CRR required this visit?  No   CCM required this visit?  No      Plan:   Keep all routine maintenance appointments.   I  have personally reviewed and noted the following in the patient's chart:   Medical and social history Use of alcohol, tobacco or illicit drugs  Current medications and supplements including opioid prescriptions.  Functional ability and status Nutritional status Physical activity Advanced directives List of other physicians Hospitalizations, surgeries, and ER visits in previous 12 months Vitals Screenings to include cognitive, depression, and falls Referrals and appointments  In addition, I have reviewed and  discussed with patient certain preventive protocols, quality metrics, and best practice recommendations. A written personalized care plan for preventive services as well as general preventive health recommendations were provided to patient.     Ashok Pall, LPN   1/61/0960

## 2021-05-28 NOTE — Patient Instructions (Addendum)
[name] , Thank you for taking time to come for your Medicare Wellness Visit. I appreciate your ongoing commitment to your health goals. Please review the following plan we discussed and let me know if I can assist you in the future.   These are the goals we discussed:  Goals       Patient Stated     Maintain Healthy Lifestyle (pt-stated)      Stay active  Healthy diet Stay hydrated        This is a list of the screening recommended for you and due dates:  Health Maintenance  Topic Date Due   COVID-19 Vaccine (5 - Booster for Moderna series) 06/03/2021*   Tetanus Vaccine  09/07/2021*   Zoster (Shingles) Vaccine (1 of 2) 12/08/2021*   Mammogram  11/01/2021   Pneumonia Vaccine  Completed   Flu Shot  Completed   DEXA scan (bone density measurement)  Completed   HPV Vaccine  Aged Out  *Topic was postponed. The date shown is not the original due date.    Opioid Pain Medicine Management Opioids are powerful medicines that are used to treat moderate to severe pain. When used for short periods of time, they can help you to: Sleep better. Do better in physical or occupational therapy. Feel better in the first few days after an injury. Recover from surgery. Opioids should be taken with the supervision of a trained health care provider. They should be taken for the shortest period of time possible. This is because opioids can be addictive, and the longer you take opioids, the greater your risk of addiction. This addiction can also be called opioid use disorder. What are the risks? Using opioid pain medicines for longer than 3 days increases your risk of side effects. Side effects include: Constipation. Nausea and vomiting. Breathing difficulties (respiratory depression). Drowsiness. Confusion. Opioid use disorder. Itching. Taking opioid pain medicine for a long period of time can affect your ability to do daily tasks. It also puts you at risk for: Motor vehicle  crashes. Depression. Suicide. Heart attack. Overdose, which can be life-threatening. What is a pain treatment plan? A pain treatment plan is an agreement between you and your health care provider. Pain is unique to each person, and treatments vary depending on your condition. To manage your pain, you and your health care provider need to work together. To help you do this: Discuss the goals of your treatment, including how much pain you might expect to have and how you will manage the pain. Review the risks and benefits of taking opioid medicines. Remember that a good treatment plan uses more than one approach and minimizes the chance of side effects. Be honest about the amount of medicines you take and about any drug or alcohol use. Get pain medicine prescriptions from only one health care provider. Pain can be managed with many types of alternative treatments. Ask your health care provider to refer you to one or more specialists who can help you manage pain through: Physical or occupational therapy. Counseling (cognitive behavioral therapy). Good nutrition. Biofeedback. Massage. Meditation. Non-opioid medicine. Following a gentle exercise program. How to use opioid pain medicine Taking medicine Take your pain medicine exactly as told by your health care provider. Take it only when you need it. If your pain gets less severe, you may take less than your prescribed dose if your health care provider approves. If you are not having pain, do nottake pain medicine unless your health care provider tells  you to take it. If your pain is severe, do nottry to treat it yourself by taking more pills than instructed on your prescription. Contact your health care provider for help. Write down the times when you take your pain medicine. It is easy to become confused while on pain medicine. Writing the time can help you avoid overdose. Take other over-the-counter or prescription medicines only as told by  your health care provider. Keeping yourself and others safe  While you are taking opioid pain medicine: Do not drive, use machinery, or power tools. Do not sign legal documents. Do not drink alcohol. Do not take sleeping pills. Do not supervise children by yourself. Do not do activities that require climbing or being in high places. Do not go to a lake, river, ocean, spa, or swimming pool. Do not share your pain medicine with anyone. Keep pain medicine in a locked cabinet or in a secure area where pets and children cannot reach it. Stopping your use of opioids If you have been taking opioid medicine for more than a few weeks, you may need to slowly decrease (taper) how much you take until you stop completely. Tapering your use of opioids can decrease your risk of symptoms of withdrawal, such as: Pain and cramping in the abdomen. Nausea. Sweating. Sleepiness. Restlessness. Uncontrollable shaking (tremors). Cravings for the medicine. Do not attempt to taper your use of opioids on your own. Talk with your health care provider about how to do this. Your health care provider may prescribe a step-down schedule based on how much medicine you are taking and how long you have been taking it. Getting rid of leftover pills Do not save any leftover pills. Get rid of leftover pills safely by: Taking the medicine to a prescription take-back program. This is usually offered by the county or law enforcement. Bringing them to a pharmacy that has a drug disposal container. Flushing them down the toilet. Check the label or package insert of your medicine to see whether this is safe to do. Throwing them out in the trash. Check the label or package insert of your medicine to see whether this is safe to do. If it is safe to throw it out, remove the medicine from the original container, put it into a sealable bag or container, and mix it with used coffee grounds, food scraps, dirt, or cat litter before putting  it in the trash. Follow these instructions at home: Activity Do exercises as told by your health care provider. Avoid activities that make your pain worse. Return to your normal activities as told by your health care provider. Ask your health care provider what activities are safe for you. General instructions You may need to take these actions to prevent or treat constipation: Drink enough fluid to keep your urine pale yellow. Take over-the-counter or prescription medicines. Eat foods that are high in fiber, such as beans, whole grains, and fresh fruits and vegetables. Limit foods that are high in fat and processed sugars, such as fried or sweet foods. Keep all follow-up visits. This is important. Where to find support If you have been taking opioids for a long time, you may benefit from receiving support for quitting from a local support group or counselor. Ask your health care provider for a referral to these resources in your area. Where to find more information Centers for Disease Control and Prevention (CDC): FootballExhibition.com.br [place] Food and Drug Administration (FDA): PumpkinSearch.com.ee Get help right away if: You may have taken too  much of an opioid (overdosed). Common symptoms of an overdose: Your breathing is slower or more shallow than normal. You have a very slow heartbeat (pulse). You have slurred speech. You have nausea and vomiting. Your pupils become very small. You have other potential symptoms: You are very confused. You faint or feel like you will faint. You have cold, clammy skin. You have blue lips or fingernails. You have thoughts of harming yourself or harming others. These symptoms may represent a serious problem that is an emergency. Do not wait to see if the symptoms will go away. Get medical help right away. Call your local emergency services (911 in the [place] Do not drive yourself to the hospital.  If you ever feel like you may hurt yourself or others, or have thoughts  about taking your own life, get help right away. Go to your nearest emergency department or: Call your local emergency services (911 in the [place] Call the [facility] (570 825 3374 in the [place] Call a suicide crisis helpline, such as the National Suicide Prevention Lifeline at 787-610-6938 or 988 in the [place] This is open 24 hours a day in the [place] Text the Crisis Text Line at 541-684-9005 (in the [place] Summary Opioid medicines can help you manage moderate to severe pain for a short period of time. A pain treatment plan is an agreement between you and your health care provider. Discuss the goals of your treatment, including how much pain you might expect to have and how you will manage the pain. If you think that you or someone else may have taken too much of an opioid, get medical help right away. This information is not intended to replace advice given to you by your health care provider. Make sure you discuss any questions you have with your health care provider. Document Revised: 09/20/2020 Document Reviewed: 06/07/2020 Elsevier Patient Education  2022 [employer].

## 2021-05-31 ENCOUNTER — Other Ambulatory Visit: Payer: Medicare Other

## 2021-06-04 DIAGNOSIS — N184 Chronic kidney disease, stage 4 (severe): Secondary | ICD-10-CM | POA: Diagnosis not present

## 2021-06-04 DIAGNOSIS — D631 Anemia in chronic kidney disease: Secondary | ICD-10-CM | POA: Diagnosis not present

## 2021-06-04 DIAGNOSIS — N2581 Secondary hyperparathyroidism of renal origin: Secondary | ICD-10-CM | POA: Diagnosis not present

## 2021-06-04 DIAGNOSIS — N1832 Chronic kidney disease, stage 3b: Secondary | ICD-10-CM | POA: Diagnosis not present

## 2021-06-04 DIAGNOSIS — I10 Essential (primary) hypertension: Secondary | ICD-10-CM | POA: Diagnosis not present

## 2021-06-05 ENCOUNTER — Other Ambulatory Visit (INDEPENDENT_AMBULATORY_CARE_PROVIDER_SITE_OTHER): Payer: Medicare Other

## 2021-06-05 ENCOUNTER — Other Ambulatory Visit: Payer: Self-pay

## 2021-06-05 DIAGNOSIS — I10 Essential (primary) hypertension: Secondary | ICD-10-CM | POA: Diagnosis not present

## 2021-06-05 DIAGNOSIS — E7800 Pure hypercholesterolemia, unspecified: Secondary | ICD-10-CM | POA: Diagnosis not present

## 2021-06-05 DIAGNOSIS — E039 Hypothyroidism, unspecified: Secondary | ICD-10-CM | POA: Diagnosis not present

## 2021-06-05 DIAGNOSIS — K754 Autoimmune hepatitis: Secondary | ICD-10-CM | POA: Diagnosis not present

## 2021-06-05 LAB — HEPATIC FUNCTION PANEL
ALT: 19 U/L (ref 0–35)
AST: 16 U/L (ref 0–37)
Albumin: 4.4 g/dL (ref 3.5–5.2)
Alkaline Phosphatase: 115 U/L (ref 39–117)
Bilirubin, Direct: 0.1 mg/dL (ref 0.0–0.3)
Total Bilirubin: 0.4 mg/dL (ref 0.2–1.2)
Total Protein: 6.5 g/dL (ref 6.0–8.3)

## 2021-06-05 LAB — LIPID PANEL
Cholesterol: 162 mg/dL (ref 0–200)
HDL: 59.5 mg/dL (ref >39.00)
LDL Cholesterol: 72 mg/dL (ref 0–99)
NonHDL: 102.35
Total CHOL/HDL Ratio: 3
Triglycerides: 153 mg/dL — ABNORMAL HIGH (ref 0.0–149.0)
VLDL: 30.6 mg/dL (ref 0.0–40.0)

## 2021-06-05 LAB — BASIC METABOLIC PANEL
BUN: 57 mg/dL — ABNORMAL HIGH (ref 6–23)
CO2: 23 mEq/L (ref 19–32)
Calcium: 9.6 mg/dL (ref 8.4–10.5)
Chloride: 104 mEq/L (ref 96–112)
Creatinine, Ser: 1.78 mg/dL — ABNORMAL HIGH (ref 0.40–1.20)
GFR: 24.74 mL/min — ABNORMAL LOW (ref >60.00)
Glucose, Bld: 94 mg/dL (ref 70–99)
Potassium: 4.5 mEq/L (ref 3.5–5.1)
Sodium: 139 mEq/L (ref 135–145)

## 2021-06-05 LAB — TSH: TSH: 1.85 u[IU]/mL (ref 0.35–5.50)

## 2021-06-06 ENCOUNTER — Telehealth: Payer: Self-pay | Admitting: Internal Medicine

## 2021-06-06 DIAGNOSIS — N1832 Chronic kidney disease, stage 3b: Secondary | ICD-10-CM

## 2021-06-06 DIAGNOSIS — K7460 Unspecified cirrhosis of liver: Secondary | ICD-10-CM | POA: Diagnosis not present

## 2021-06-06 DIAGNOSIS — M159 Polyosteoarthritis, unspecified: Secondary | ICD-10-CM | POA: Diagnosis not present

## 2021-06-06 DIAGNOSIS — M25562 Pain in left knee: Secondary | ICD-10-CM | POA: Diagnosis not present

## 2021-06-06 DIAGNOSIS — M1990 Unspecified osteoarthritis, unspecified site: Secondary | ICD-10-CM | POA: Diagnosis not present

## 2021-06-06 DIAGNOSIS — G8929 Other chronic pain: Secondary | ICD-10-CM | POA: Diagnosis not present

## 2021-06-06 NOTE — Telephone Encounter (Signed)
Called and left Ms Riess a message.  Unable to get in touch with her.  Dr Holley Raring reviewed labs.  Per Dr Holley Raring - Have her hold the torsemide tomorrow and Friday, resume on Saturday and come back Monday for repeat renal function testing.  Once resumes on Saturday, then continue torsemide one po qod,.  Needs f/u met b on Monday.   ?

## 2021-06-07 NOTE — Addendum Note (Signed)
Addended by: Lars Masson on: 06/07/2021 11:33 AM ? ? Modules accepted: Orders ? ?

## 2021-06-07 NOTE — Telephone Encounter (Signed)
Spoke with patient. Will hold torsemide today and tomorrow and then resume qod on Saturday. Scheduled for recheck on Monday  ?

## 2021-06-10 DIAGNOSIS — Z20822 Contact with and (suspected) exposure to covid-19: Secondary | ICD-10-CM | POA: Diagnosis not present

## 2021-06-11 ENCOUNTER — Other Ambulatory Visit (INDEPENDENT_AMBULATORY_CARE_PROVIDER_SITE_OTHER): Payer: Medicare Other

## 2021-06-11 DIAGNOSIS — N1832 Chronic kidney disease, stage 3b: Secondary | ICD-10-CM

## 2021-06-12 ENCOUNTER — Encounter: Payer: Self-pay | Admitting: Cardiovascular Disease

## 2021-06-12 ENCOUNTER — Ambulatory Visit (INDEPENDENT_AMBULATORY_CARE_PROVIDER_SITE_OTHER): Payer: Medicare Other | Admitting: Cardiovascular Disease

## 2021-06-12 ENCOUNTER — Telehealth: Payer: Self-pay | Admitting: *Deleted

## 2021-06-12 VITALS — BP 116/70 | HR 69 | Ht 62.0 in | Wt 141.4 lb

## 2021-06-12 DIAGNOSIS — I5032 Chronic diastolic (congestive) heart failure: Secondary | ICD-10-CM | POA: Diagnosis not present

## 2021-06-12 DIAGNOSIS — I340 Nonrheumatic mitral (valve) insufficiency: Secondary | ICD-10-CM | POA: Diagnosis not present

## 2021-06-12 DIAGNOSIS — I2510 Atherosclerotic heart disease of native coronary artery without angina pectoris: Secondary | ICD-10-CM

## 2021-06-12 DIAGNOSIS — I10 Essential (primary) hypertension: Secondary | ICD-10-CM

## 2021-06-12 DIAGNOSIS — I4821 Permanent atrial fibrillation: Secondary | ICD-10-CM

## 2021-06-12 LAB — BASIC METABOLIC PANEL
BUN: 45 mg/dL — ABNORMAL HIGH (ref 6–23)
CO2: 23 mEq/L (ref 19–32)
Calcium: 8.9 mg/dL (ref 8.4–10.5)
Chloride: 104 mEq/L (ref 96–112)
Creatinine, Ser: 1.77 mg/dL — ABNORMAL HIGH (ref 0.40–1.20)
GFR: 24.9 mL/min — ABNORMAL LOW (ref >60.00)
Glucose, Bld: 38 mg/dL — CL (ref 70–99)
Potassium: 4.5 mEq/L (ref 3.5–5.1)
Sodium: 138 mEq/L (ref 135–145)

## 2021-06-12 NOTE — Telephone Encounter (Signed)
Do not have other lab results, but called lab of blood sugar 38.  Need to call her and confirm doing ok.  Is she feeling ok.  Eating?  Has she eaten since her labs.   ?

## 2021-06-12 NOTE — Telephone Encounter (Signed)
CRITICAL VALUE STICKER ? ?CRITICAL VALUE: Glucose-38 (L) ? ?RECEIVER (on-site recipient of call): Jari Favre, CMA/XT ? ?DATE & TIME NOTIFIED: 06/12/21 @ 10:53am ? ?MESSENGER (representative from lab): [place] @ [place] [facility] ? ?MD NOTIFIED: Dr. Einar Pheasant ? ?TIME OF NOTIFICATION: 10:52am  ? ?RESPONSE:   ?

## 2021-06-12 NOTE — Patient Instructions (Signed)

## 2021-06-12 NOTE — Progress Notes (Signed)
?  ?Cardiology Office Note ? ? ?Date:  06/12/2021  ? ?ID:  Ruth Roth, DOB 30-Jul-1928, MRN [id number] ? ?PCP:  Einar Pheasant, MD  ?Cardiologist:   [name], MD  ? ?Chief Complaint  ?Patient presents with  ? Other  ?  4 month f/u c/o sharp left chest pain for about 1hr Saturday. Meds reviewed verbally with pt.  ? ? ? ?  ?History of Present Illness: ?Ruth Roth is a 88 y.o. female who presents for a follow-up visit regarding coronary artery disease, chronic diastolic heart failure, mitral regurgitation and persistent atrial fibrillation .  ?She has known history of hypertension, chronic kidney disease, peptic ulcer disease, fibrocystic breast disease, inflammatory arthritis, autoimmune hepatitis with cirrhosis and neuropathy.  ?She had a small non-ST elevation myocardial infarction in March 2016 with acute diastolic heart failure after a GI illness.  Echocardiogram showed normal LV systolic function. Cardiac catheterization showed showed 60% ostial left circumflex hazy stenosis which was possibly the culprit. Mild LAD/RCA disease. She was treated medically.  ?  ?She had worsening heart failure 2019.  Right and left cardiac catheterization in June 2019 showed  showed mild nonobstructive coronary artery disease.  Ejection fraction was 50 to 55% with severe mitral regurgitation.  Right heart catheterization showed severely elevated filling pressures with moderate to severe pulmonary hypertension and severely reduced cardiac output. ?I transferred the patient to [facility] where she was effectively diuresed with milrinone with subsequent improvement in symptoms.  She had a transesophageal echocardiogram done which showed normal LV systolic function with only mild mitral regurgitation.  The mitral regurgitation was felt to be functional with subsequent improvement after diuresis. ?Atrial fibrillation is currently being treated with rate control due to symptomatic maintaining sinus rhythm.   ? ?Atrial fibrillation has  been treated with rate control as we could not keep her in sinus rhythm even antiarrhythmic medication.  ?Most recent echocardiogram in December 2021 showed normal LV systolic function, severe pulmonary hypertension and moderate mitral regurgitation. ? ?Rate control was difficult in the past but improved significantly with the addition of diltiazem.  She has been doing very well with no chest pain or worsening dyspnea.  No significant leg edema.  Her weight has been stable around 140 pounds. ? ?Past Medical History:  ?Diagnosis Date  ? (HFpEF) heart failure with preserved ejection fraction ([place])   ? a. 07/2017 Echo: EF 50-55%, no rwma, mild AS/AI, mod MR, mod dil LA, mod TR, PASP 4mHg.  ? Autoimmune hepatitis ([place]   ? followed by Dr SGustavo Lah ? Fibrocystic breast disease   ? Glaucoma   ? Hypercholesterolemia   ? Hypertension   ? Hypothyroidism   ? Inflammatory arthritis   ? [place]) [place]   ? Neuropathy   ? Non-obstructive Coronary artery disease   ? a. 05/2014 NSTEMI/Cath: LM nl, LAD mild dzs, LCX 60ost hazy (? culprit), RCA mild dzs. EF 60% by echo-->Med Rx; b. 08/2017 Cath: LM 30ost, LAD min irregs, LCX small, 40ost/p, RCA large, min irregs, RPDA/RPL1 nl, EF 50-55%. 4+MR.  ? Osteoarthritis   ? Permanent atrial fibrillation ([place]   ? a. CHA2DS2VASc = 6-->Xarelto.  ? PUD (peptic ulcer disease)   ? 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 Severe mitral regurgitation   ? a. 07/2017 Echo: Mod MR; b. 08/2017 Cath: 4+/Severe MR.  ? ? ?Past Surgical History:  ?Procedure Laterality Date  ? ABDOMINAL HYSTERECTOMY  1963  ? partial, secondary to fibroids  ?  APPENDECTOMY    ? Billroth    ? CARDIAC CATHETERIZATION  05/12/14  ? [place]  ? CARDIOVERSION N/A 12/26/2016  ? Procedure: CARDIOVERSION;  Surgeon: [name], MD;  Location: [facility] ORS;  Service: Cardiovascular;  Laterality: N/A;  ? CARDIOVERSION N/A 05/16/2017  ? Procedure: CARDIOVERSION;  Surgeon: [name], MD;  Location:  [facility] ORS;  Service: Cardiovascular;  Laterality: N/A;  ? CARDIOVERSION N/A 06/09/2017  ? Procedure: CARDIOVERSION;  Surgeon: [name], MD;  Location: [facility] ORS;  Service: Cardiovascular;  Laterality: N/A;  ? CARDIOVERSION N/A 11/30/2018  ? Procedure: CARDIOVERSION;  Surgeon: [name], MD;  Location: [facility] ORS;  Service: Cardiovascular;  Laterality: N/A;  ? CHOLECYSTECTOMY  1998  ? COLONOSCOPY  2009  ? RIGHT/LEFT HEART CATH AND CORONARY ANGIOGRAPHY Bilateral 08/21/2017  ? Procedure: RIGHT/LEFT HEART CATH AND CORONARY ANGIOGRAPHY;  Surgeon: [name], MD;  Location: [place] [facility];  Service: Cardiovascular;  Laterality: Bilateral;  ? TEE WITHOUT CARDIOVERSION N/A 08/25/2017  ? Procedure: TRANSESOPHAGEAL ECHOCARDIOGRAM (TEE);  Surgeon: Jolaine Artist, MD;  Location: [facility];  Service: Cardiovascular;  Laterality: N/A;  ? UPPER GI ENDOSCOPY  2004  ? ? ? ?Current Outpatient Medications  ?Medication Sig Dispense Refill  ? allopurinol (ZYLOPRIM) 100 MG tablet TAKE 1/2 TABLET(50 MG) BY MOUTH DAILY 45 tablet 1  ? colchicine 0.6 MG tablet Take 0.6 mg by mouth daily as needed.    ? CYANOCOBALAMIN PO Take 500 mcg by mouth once a week.    ? diltiazem (CARDIZEM CD) 120 MG 24 hr capsule TAKE 1 CAPSULE(120 MG) BY MOUTH DAILY 90 capsule 1  ? dorzolamide (TRUSOPT) 2 % ophthalmic solution Place 1 drop into the left eye 2 (two) times daily.    ? fluticasone (FLONASE) 50 MCG/ACT nasal spray Place 2 sprays into both nostrils daily as needed for allergies.     ? fluticasone (FLONASE) 50 MCG/ACT nasal spray Place 1 spray into both nostrils 2 (two) times daily. 16 g 2  ? gabapentin (NEURONTIN) 300 MG capsule One capsule tid and two capsules q hs. 150 capsule 2  ? hydroxychloroquine (PLAQUENIL) 200 MG tablet Take 200 mg by mouth every other day.    ? ipratropium (ATROVENT) 0.03 % nasal spray SMARTSIG:2 Spray(s) Both Nares 3 Times Daily PRN    ? latanoprost (XALATAN) 0.005 % ophthalmic solution Place 1 drop  into both eyes at bedtime.     ? levothyroxine (SYNTHROID) 112 MCG tablet TAKE 1 TABLET(112 MCG) BY MOUTH DAILY BEFORE BREAKFAST 90 tablet 2  ? metoprolol tartrate (LOPRESSOR) 100 MG tablet Take 1.5 tablets (150 mg total) by mouth 2 (two) times daily. 270 tablet 0  ? Multiple Vitamin (MULTI-VITAMIN) tablet Take 1 tablet by mouth at bedtime.     ? torsemide (DEMADEX) 20 MG tablet ALTERNATE 2 TABLETS BY MOUTH ONE DAY AND 1 TABLET THE NEXT DAY. 144 tablet 0  ? traMADol (ULTRAM) 50 MG tablet Take 1 tablet (50 mg total) by mouth 2 (two) times daily as needed (for pain.). 60 tablet 0  ? ursodiol (ACTIGALL) 300 MG capsule Take 300-600 mg by mouth See admin instructions. Take 2 capsules (600 mg) daily in the morning   & 1 capsule (300 mg) at night.    ? XARELTO 15 MG TABS tablet TAKE 1 TABLET(15 MG) BY MOUTH DAILY WITH SUPPER 90 tablet 1  ? ?No current facility-administered medications for this visit.  ? ? ?Allergies:   Statins, Haldol [haloperidol lactate], Librax [chlordiazepoxide-clidinium], Plavix [  clopidogrel bisulfate], and Ramipril  ? ? ?Social History:  The patient  reports that she has never smoked. She has never used smokeless tobacco. She reports that she does not drink alcohol and does not use drugs.  ? ?Family History:  The patient's family history includes Colon cancer in her daughter; Esophageal cancer in her brother; Ovarian cancer in her daughter; Stroke in her mother.  ? ? ?ROS:  Please see the history of present illness.   Otherwise, review of systems are positive for none.   All other systems are reviewed and negative.  ? ? ?PHYSICAL EXAM: ?VS:  BP 116/70 (BP Location: Left Arm, Patient Position: Sitting, Cuff Size: Normal)   Pulse 69   Ht '5\' 2"'$  (1.575 m)   Wt 141 lb 6 oz (64.1 kg)   SpO2 98%   BMI 25.86 kg/m?  , BMI Body mass index is 25.86 kg/m?. ?GEN: Well nourished, well developed, in no acute distress  ?HEENT: normal  ?Neck: No JVD, carotid bruits, or masses ?Cardiac: Irregularly irregular; no  rubs, or gallops.  1/ 6 systolic ejection murmur at the aortic area.  Mild bilateral leg edema ?Respiratory: Clear to auscultation ?GI: soft, nontender, nondistended, + BS ?MS: no deformity or atrophy  ?Ski

## 2021-06-12 NOTE — Telephone Encounter (Signed)
LMTCB. Pt had labs drawn yesterday. ?

## 2021-06-13 NOTE — Telephone Encounter (Signed)
Lm for pt to cb.

## 2021-06-14 DIAGNOSIS — Z20822 Contact with and (suspected) exposure to covid-19: Secondary | ICD-10-CM | POA: Diagnosis not present

## 2021-06-14 NOTE — Telephone Encounter (Signed)
See result note. LM for patient/ ?

## 2021-06-18 ENCOUNTER — Other Ambulatory Visit: Payer: Self-pay

## 2021-06-18 DIAGNOSIS — I272 Pulmonary hypertension, unspecified: Secondary | ICD-10-CM

## 2021-06-18 NOTE — Telephone Encounter (Signed)
Pt called in stating that she was speaking with someone and got disconnected... Pt called back.. Try to transfer pt... Pt disconnected line... ?

## 2021-06-18 NOTE — Telephone Encounter (Signed)
Attempted to call pt back - line disconnected. ? ?

## 2021-06-18 NOTE — Telephone Encounter (Signed)
Pt returning call. Pt requesting callback.  °

## 2021-06-19 ENCOUNTER — Telehealth: Payer: Self-pay

## 2021-06-19 NOTE — Telephone Encounter (Signed)
Pt called back and I read the message to her and she understood 

## 2021-06-19 NOTE — Telephone Encounter (Signed)
Lm for pt to cb re: labs ? ?Can tell pt to have her monitor her weight, since we have decreased her fluid pill. If gains more than three pounds in one day or 5 pounds in one week, or increased sob, etc. - let us know.    ?

## 2021-06-21 DIAGNOSIS — G8929 Other chronic pain: Secondary | ICD-10-CM | POA: Diagnosis not present

## 2021-06-21 DIAGNOSIS — M1712 Unilateral primary osteoarthritis, left knee: Secondary | ICD-10-CM | POA: Diagnosis not present

## 2021-06-21 DIAGNOSIS — M25562 Pain in left knee: Secondary | ICD-10-CM | POA: Diagnosis not present

## 2021-06-24 ENCOUNTER — Other Ambulatory Visit: Payer: Self-pay | Admitting: Internal Medicine

## 2021-06-25 DIAGNOSIS — Z20822 Contact with and (suspected) exposure to covid-19: Secondary | ICD-10-CM | POA: Diagnosis not present

## 2021-06-26 DIAGNOSIS — K219 Gastro-esophageal reflux disease without esophagitis: Secondary | ICD-10-CM | POA: Diagnosis not present

## 2021-06-26 DIAGNOSIS — K743 Primary biliary cirrhosis: Secondary | ICD-10-CM | POA: Diagnosis not present

## 2021-06-27 ENCOUNTER — Other Ambulatory Visit: Payer: Self-pay | Admitting: Gastroenterology

## 2021-06-27 DIAGNOSIS — K743 Primary biliary cirrhosis: Secondary | ICD-10-CM

## 2021-06-28 ENCOUNTER — Other Ambulatory Visit: Payer: Medicare Other

## 2021-06-28 DIAGNOSIS — M1712 Unilateral primary osteoarthritis, left knee: Secondary | ICD-10-CM | POA: Diagnosis not present

## 2021-07-09 ENCOUNTER — Ambulatory Visit: Payer: Medicare Other

## 2021-07-09 ENCOUNTER — Other Ambulatory Visit (INDEPENDENT_AMBULATORY_CARE_PROVIDER_SITE_OTHER): Payer: Medicare Other

## 2021-07-09 DIAGNOSIS — I272 Pulmonary hypertension, unspecified: Secondary | ICD-10-CM | POA: Diagnosis not present

## 2021-07-09 LAB — BASIC METABOLIC PANEL
BUN: 27 mg/dL — ABNORMAL HIGH (ref 6–23)
CO2: 23 mEq/L (ref 19–32)
Calcium: 9 mg/dL (ref 8.4–10.5)
Chloride: 109 mEq/L (ref 96–112)
Creatinine, Ser: 1.32 mg/dL — ABNORMAL HIGH (ref 0.40–1.20)
GFR: 35.39 mL/min — ABNORMAL LOW (ref >60.00)
Glucose, Bld: 95 mg/dL (ref 70–99)
Potassium: 5.1 mEq/L (ref 3.5–5.1)
Sodium: 141 mEq/L (ref 135–145)

## 2021-07-11 ENCOUNTER — Ambulatory Visit (INDEPENDENT_AMBULATORY_CARE_PROVIDER_SITE_OTHER): Payer: Medicare Other | Admitting: Family

## 2021-07-11 ENCOUNTER — Encounter: Payer: Self-pay | Admitting: Family

## 2021-07-11 VITALS — BP 122/70 | HR 64 | Temp 98.0°F | Ht 62.0 in | Wt 140.0 lb

## 2021-07-11 DIAGNOSIS — B029 Zoster without complications: Secondary | ICD-10-CM | POA: Diagnosis not present

## 2021-07-11 DIAGNOSIS — I2510 Atherosclerotic heart disease of native coronary artery without angina pectoris: Secondary | ICD-10-CM

## 2021-07-11 MED ORDER — VALACYCLOVIR HCL 500 MG PO TABS: 500.0000 mg | ORAL_TABLET | Freq: Three times a day (TID) | ORAL | 0 refills | Status: DC

## 2021-07-11 NOTE — Assessment & Plan Note (Addendum)
Rash and presentation most consistent with herpes zoster.  History of chronic kidney disease.  Crt Cl 21 mL/min ?Renally dosed valacyclovir and advised to start valacyclovir '500mg'$  q8 hours x 7 days.  I have advised against the use of NSAIDs.  Advised that she may trial over-the-counter topical capsaicin for pain control.  Close follow-up  to ensure rash is resolving and pain adequately controlled as scheduled 07/16/2021.  Encouraged her once rash completely resolved that she may have Shingrix vaccine. ?

## 2021-07-11 NOTE — Patient Instructions (Signed)
Start valacyclovir, antiviral to treat shingles.  ? ? As discussed you may use over-the-counter capsaicin which is made from helping hot peppers to treat pain neuropathic pain from shingles ? ? please try this in an area which is unaffected by rash to ensure that you are not allergic to it. ? ?Please let me know how you are doing and most certainly if rash does not respond to antiviral ? ?Shingles ? ?Shingles, which is also known as herpes zoster, is an infection that causes a painful skin rash and fluid-filled blisters. It is caused by a virus. ?Shingles only develops in people who: ?Have had chickenpox. ?Have been vaccinated against chickenpox. Shingles is rare in this group. ?What are the causes? ?Shingles is caused by varicella-zoster virus. This is the same virus that causes chickenpox. After a person is exposed to the virus, it stays in the body in an inactive (dormant) state. Shingles develops if the virus is reactivated. This can happen many years after the first (initial) exposure to the virus. It is not known what causes this virus to be reactivated. ?What increases the risk? ?People who have had chickenpox or received the chickenpox vaccine are at risk for shingles. Shingles infection is more common in people who: ?Are older than 86 years of age. ?Have a weakened disease-fighting system (immune system), such as people with: ?HIV (human immunodeficiency virus). ?AIDS (acquired immunodeficiency syndrome). ?Cancer. ?Are taking medicines that weaken the immune system, such as organ transplant medicines. ?Are experiencing a lot of stress. ?What are the signs or symptoms? ?Early symptoms of this condition include itching, tingling, and pain in an area on your skin. Pain may be described as burning, stabbing, or throbbing. ?A few days or weeks after early symptoms start, a painful red rash appears. The rash is usually on one side of the body and has a band-like or belt-like pattern. The rash eventually turns  into fluid-filled blisters that break open, change into scabs, and dry up in about 2-3 weeks. ?At any time during the infection, you may also develop: ?A fever. ?Chills. ?A headache. ?Nausea. ?How is this diagnosed? ?This condition is diagnosed with a skin exam. Skin or fluid samples (a culture) may be taken from the blisters before a diagnosis is made. ?How is this treated? ?The rash may last for several weeks. There is not a specific cure for this condition. Your health care provider may prescribe medicines to help you manage pain, recover more quickly, and avoid long-term problems. Medicines may include: ?Antiviral medicines. ?Anti-inflammatory medicines. ?Pain medicines. ?Anti-itching medicines (antihistamines). ?If the area involved is on your face, you may be referred to a specialist, such as an eye doctor (ophthalmologist) or an ear, nose, and throat (ENT) doctor (otorhinolaryngologist) to help you avoid eye problems, chronic pain, or disability. ?Follow these instructions at home: ?Medicines ?Take over-the-counter and prescription medicines only as told by your health care provider. ?Apply an anti-itch cream or numbing cream to the affected area as told by your health care provider. ?Relieving itching and discomfort ? ?Apply cold, wet cloths (cold compresses) to the area of the rash or blisters as told by your health care provider. ?Cool baths can be soothing. Try adding baking soda or dry oatmeal to the water to reduce itching. Do not bathe in hot water. ?Use calamine lotion as recommended by your health care provider. This is an over-the-counter lotion that helps to relieve itchiness. ?Blister and rash care ?Keep your rash covered with a loose bandage (dressing). Wear  loose-fitting clothing to help ease the pain of material rubbing against the rash. ?Wash your hands with soap and water for at least 20 seconds before and after you change your dressing. If soap and water are not available, use hand  sanitizer. ?Change your dressing as told by your health care provider. ?Keep your rash and blisters clean by washing the area with mild soap and cool water as told by your health care provider. ?Check your rash every day for signs of infection. Check for: ?More redness, swelling, or pain. ?Fluid or blood. ?Warmth. ?Pus or a bad smell. ?Do not scratch your rash or pick at your blisters. To help avoid scratching: ?Keep your fingernails clean and cut short. ?Wear gloves or mittens while you sleep, if scratching is a problem. ?General instructions ?Rest as told by your health care provider. ?Wash your hands often with soap and water for at least 20 seconds. If soap and water are not available, use hand sanitizer. Doing this lowers your chance of getting a bacterial skin infection. ?Before your blisters change into scabs, your shingles infection can cause chickenpox in people who have never had it or have never been vaccinated against it. To prevent this from happening, avoid contact with other people, especially: ?Babies. ?Pregnant women. ?Children who have eczema. ?Older people who have transplants. ?People who have chronic illnesses, such as cancer or AIDS. ?Keep all follow-up visits. This is important. ?How is this prevented? ?Getting vaccinated is the best way to prevent shingles and protect against shingles complications. If you have not been vaccinated, talk with your health care provider about getting the vaccine. ?Where to find more information ?Centers for Disease Control and Prevention: http://www.wolf.info/ ?Contact a health care provider if: ?Your pain is not relieved with prescribed medicines. ?Your pain does not get better after the rash heals. ?You have any of these signs of infection: ?More redness, swelling, or pain around the rash. ?Fluid or blood coming from the rash. ?Warmth coming from your rash. ?Pus or a bad smell coming from the rash. ?A fever. ?Get help right away if: ?The rash is on your face or  nose. ?You have facial pain, pain around your eye area, or loss of feeling on one side of your face. ?You have difficulty seeing. ?You have ear pain or have ringing in your ear. ?You have a loss of taste. ?Your condition gets worse. ?Summary ?Shingles, also known as herpes zoster, is an infection that causes a painful skin rash and fluid-filled blisters. ?This condition is diagnosed with a skin exam. Skin or fluid samples (a culture) may be taken from the blisters. ?Keep your rash covered with a loose bandage (dressing). Wear loose-fitting clothing to help ease the pain of material rubbing against the rash. ?Before your blisters change into scabs, your shingles infection can cause chickenpox in people who have never had it or have never been vaccinated against it. ?This information is not intended to replace advice given to you by your health care provider. Make sure you discuss any questions you have with your health care provider. ?Document Revised: 02/21/2020 Document Reviewed: 02/21/2020 ?Elsevier Patient Education ? [place]. ? ? ? ? ? ?

## 2021-07-11 NOTE — Progress Notes (Signed)
? ?Subjective:  ? ? [name], female    DOB: June 08, 1928, 88 y.o.   MRN: [id number] ? ?CC: Ruth Roth is a 88 y.o. female who presents today for an acute visit.   ? ?HPI: She is accompanied by daughter-in-law. ?1 week ago noticed pain right leg, buttocks. 2 days ago a painful vesicular rash appeared.  ?No fever, nausea, vomiting.  She has had shingles in the past.  Has not tried any medication for this. ? ? ? ? ?HISTORY:  ?Past Medical History:  ?Diagnosis Date  ? (HFpEF) heart failure with preserved ejection fraction ([place])   ? a. 07/2017 Echo: EF 50-55%, no rwma, mild AS/AI, mod MR, mod dil LA, mod TR, PASP 51mHg.  ? Autoimmune hepatitis ([place]   ? followed by Dr SGustavo Lah ? Fibrocystic breast disease   ? Glaucoma   ? Hypercholesterolemia   ? Hypertension   ? Hypothyroidism   ? Inflammatory arthritis   ? [place]   ? Neuropathy   ? Non-obstructive Coronary artery disease   ? a. 05/2014 NSTEMI/Cath: LM nl, LAD mild dzs, LCX 60ost hazy (? culprit), RCA mild dzs. EF 60% by echo-->Med Rx; b. 08/2017 Cath: LM 30ost, LAD min irregs, LCX small, 40ost/p, RCA large, min irregs, RPDA/RPL1 nl, EF 50-55%. 4+MR.  ? Osteoarthritis   ? Permanent atrial fibrillation ([place]   ? a. CHA2DS2VASc = 6-->Xarelto.  ? PUD (peptic ulcer disease)   ? 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 Severe mitral regurgitation   ? a. 07/2017 Echo: Mod MR; b. 08/2017 Cath: 4+/Severe MR.  ? ?Past Surgical History:  ?Procedure Laterality Date  ? ABDOMINAL HYSTERECTOMY  1963  ? partial, secondary to fibroids  ? APPENDECTOMY    ? Billroth    ? CARDIAC CATHETERIZATION  05/12/14  ? [place] ? CARDIOVERSION N/A 12/26/2016  ? Procedure: CARDIOVERSION;  Surgeon: AWellington Hampshire MD;  Location: [facility];  Service: Cardiovascular;  Laterality: N/A;  ? CARDIOVERSION N/A 05/16/2017  ? Procedure: CARDIOVERSION;  Surgeon: AWellington Hampshire MD;  Location: [facility];  Service: Cardiovascular;  Laterality: N/A;  ?  CARDIOVERSION N/A 06/09/2017  ? Procedure: CARDIOVERSION;  Surgeon: AWellington Hampshire MD;  Location: [facility];  Service: Cardiovascular;  Laterality: N/A;  ? CARDIOVERSION N/A 11/30/2018  ? Procedure: CARDIOVERSION;  Surgeon: AWellington Hampshire MD;  Location: [facility];  Service: Cardiovascular;  Laterality: N/A;  ? CHOLECYSTECTOMY  1998  ? COLONOSCOPY  2009  ? RIGHT/LEFT HEART CATH AND CORONARY ANGIOGRAPHY Bilateral 08/21/2017  ? Procedure: RIGHT/LEFT HEART CATH AND CORONARY ANGIOGRAPHY;  Surgeon: AWellington Hampshire MD;  [facility];  Service: Cardiovascular;  Laterality: Bilateral;  ? TEE WITHOUT CARDIOVERSION N/A 08/25/2017  ? Procedure: TRANSESOPHAGEAL ECHOCARDIOGRAM (TEE);  Surgeon: BJolaine Artist MD;  [facility];  Service: Cardiovascular;  Laterality: N/A;  ? UPPER GI ENDOSCOPY  2004  ? ?Family History  ?Problem Relation Age of Onset  ? Stroke Mother   ? Esophageal cancer Brother   ?     also had lung cancer  ? Ovarian cancer Daughter   ? Colon cancer Daughter   ? Breast cancer Neg Hx   ? ? ?Allergies: Statins, Haldol [haloperidol lactate], Librax [chlordiazepoxide-clidinium], Plavix [clopidogrel bisulfate], and Ramipril ?Current Outpatient Medications on File Prior to Visit  ?Medication Sig Dispense Refill  ? allopurinol (ZYLOPRIM) 100 MG tablet TAKE 1/2 TABLET(50 MG) BY MOUTH DAILY 45 tablet 1  ?  colchicine 0.6 MG tablet Take 0.6 mg by mouth daily as needed.    ? CYANOCOBALAMIN PO Take 500 mcg by mouth once a week.    ? diltiazem (CARDIZEM CD) 120 MG 24 hr capsule TAKE 1 CAPSULE(120 MG) BY MOUTH DAILY 90 capsule 1  ? dorzolamide (TRUSOPT) 2 % ophthalmic solution Place 1 drop into the left eye 2 (two) times daily.    ? fluticasone (FLONASE) 50 MCG/ACT nasal spray Place 2 sprays into both nostrils daily as needed for allergies.     ? fluticasone (FLONASE) 50 MCG/ACT nasal spray Place 1 spray into both nostrils 2 (two) times daily. 16 g 2  ? gabapentin (NEURONTIN) 300 MG capsule  One capsule tid and two capsules q hs. 150 capsule 2  ? hydroxychloroquine (PLAQUENIL) 200 MG tablet Take 200 mg by mouth every other day.    ? ipratropium (ATROVENT) 0.03 % nasal spray SMARTSIG:2 Spray(s) Both Nares 3 Times Daily PRN    ? latanoprost (XALATAN) 0.005 % ophthalmic solution Place 1 drop into both eyes at bedtime.     ? levothyroxine (SYNTHROID) 112 MCG tablet TAKE 1 TABLET(112 MCG) BY MOUTH DAILY BEFORE BREAKFAST 90 tablet 2  ? metoprolol tartrate (LOPRESSOR) 100 MG tablet Take 1.5 tablets (150 mg total) by mouth 2 (two) times daily. 270 tablet 0  ? Multiple Vitamin (MULTI-VITAMIN) tablet Take 1 tablet by mouth at bedtime.     ? torsemide (DEMADEX) 20 MG tablet ALTERNATE 2 TABLETS BY MOUTH ONE DAY AND 1 TABLET THE NEXT DAY. 144 tablet 0  ? traMADol (ULTRAM) 50 MG tablet Take 1 tablet (50 mg total) by mouth 2 (two) times daily as needed (for pain.). 60 tablet 0  ? ursodiol (ACTIGALL) 300 MG capsule Take 300-600 mg by mouth See admin instructions. Take 2 capsules (600 mg) daily in the morning   & 1 capsule (300 mg) at night.    ? XARELTO 15 MG TABS tablet TAKE 1 TABLET(15 MG) BY MOUTH DAILY WITH SUPPER 90 tablet 1  ? ?No current facility-administered medications on file prior to visit.  ? ? ?Social History  ? ?Tobacco Use  ? Smoking status: Never  ? Smokeless tobacco: Never  ?Vaping Use  ? Vaping Use: Never used  ?Substance Use Topics  ? Alcohol use: No  ?  Alcohol/week: 0.0 standard drinks  ? Drug use: No  ? ? ?Review of Systems  ?Constitutional:  Negative for chills and fever.  ?Respiratory:  Negative for cough.   ?Cardiovascular:  Negative for chest pain and palpitations.  ?Gastrointestinal:  Negative for nausea and vomiting.  ?Skin:  Positive for rash.  ?   ?Objective:  ?  ?BP 122/70 (BP Location: Left Arm, Patient Position: Sitting, Cuff Size: Small)   Pulse 64   Temp 98 ?F (36.7 ?C) (Temporal)   Ht '5\' 2"'$  (1.575 m)   Wt 140 lb (63.5 kg)   SpO2 99%   BMI 25.61 kg/m?  ? ? ?Physical Exam ?Vitals  reviewed.  ?Constitutional:   ?   Appearance: She is well-developed.  ?Eyes:  ?   Conjunctiva/sclera: Conjunctivae normal.  ?Cardiovascular:  ?   Rate and Rhythm: Normal rate and regular rhythm.  ?   Pulses: Normal pulses.  ?   Heart sounds: Normal heart sounds.  ?Pulmonary:  ?   Effort: Pulmonary effort is normal.  ?   Breath sounds: Normal breath sounds. No wheezing, rhonchi or rales.  ?Skin: ?   General: Skin is warm and dry.  ? ?    ?  Comments: Grouped non draining vesicular lesions noted right medial thigh proximal to buttocks.  ?Neurological:  ?   Mental Status: She is alert.  ?Psychiatric:     ?   Speech: Speech normal.     ?   Behavior: Behavior normal.     ?   Thought Content: Thought content normal.  ? ? ?   ?Assessment & Plan:  ? ? ?Problem List Items Addressed This Visit   ? ?  ? Other  ? Shingles - Primary  ?  Rash and presentation most consistent with herpes zoster.  History of chronic kidney disease.  Crt Cl 21 mL/min ?Renally dosed valacyclovir and advised to start valacyclovir '500mg'$  q8 hours x 7 days.  I have advised against the use of NSAIDs.  Advised that she may trial over-the-counter topical capsaicin for pain control.  Close follow-up  to ensure rash is resolving and pain adequately controlled as scheduled 07/16/2021.  Encouraged her once rash completely resolved that she may have Shingrix vaccine. ? ?  ?  ? Relevant Medications  ? valACYclovir (VALTREX) 500 MG tablet  ? ? ? ?I am having Ruth Roth. Taliaferro start on valACYclovir. I am also having her maintain her fluticasone, dorzolamide, ursodiol, latanoprost, Multi-Vitamin, gabapentin, torsemide, ipratropium, CYANOCOBALAMIN PO, metoprolol tartrate, hydroxychloroquine, allopurinol, fluticasone, diltiazem, traMADol, levothyroxine, colchicine, and Xarelto. ? ? ?Meds ordered this encounter  ?Medications  ? valACYclovir (VALTREX) 500 MG tablet  ?  Sig: Take 1 tablet (500 mg total) by mouth every 8 (eight) hours for 7 days.  ?  Dispense:  21 tablet  ?   Refill:  0  ?  Order Specific Question:   Supervising Provider  ?  Answer:   Crecencio Mc [[id number]]  ? ? ?Return precautions given.  ? ?Risks, benefits, and alternatives of the medications and treatment plan

## 2021-07-12 ENCOUNTER — Ambulatory Visit: Payer: Medicare Other

## 2021-07-14 DIAGNOSIS — Z20822 Contact with and (suspected) exposure to covid-19: Secondary | ICD-10-CM | POA: Diagnosis not present

## 2021-07-16 ENCOUNTER — Encounter: Payer: Self-pay | Admitting: Internal Medicine

## 2021-07-16 ENCOUNTER — Ambulatory Visit (INDEPENDENT_AMBULATORY_CARE_PROVIDER_SITE_OTHER): Payer: Medicare Other | Admitting: Internal Medicine

## 2021-07-16 VITALS — BP 126/70 | HR 86 | Temp 98.0°F | Resp 20 | Ht 62.0 in | Wt 139.0 lb

## 2021-07-16 DIAGNOSIS — I2510 Atherosclerotic heart disease of native coronary artery without angina pectoris: Secondary | ICD-10-CM | POA: Diagnosis not present

## 2021-07-16 DIAGNOSIS — K219 Gastro-esophageal reflux disease without esophagitis: Secondary | ICD-10-CM

## 2021-07-16 DIAGNOSIS — I5032 Chronic diastolic (congestive) heart failure: Secondary | ICD-10-CM

## 2021-07-16 DIAGNOSIS — I4819 Other persistent atrial fibrillation: Secondary | ICD-10-CM | POA: Diagnosis not present

## 2021-07-16 DIAGNOSIS — B029 Zoster without complications: Secondary | ICD-10-CM | POA: Diagnosis not present

## 2021-07-16 DIAGNOSIS — I10 Essential (primary) hypertension: Secondary | ICD-10-CM

## 2021-07-16 DIAGNOSIS — N1832 Chronic kidney disease, stage 3b: Secondary | ICD-10-CM

## 2021-07-16 DIAGNOSIS — K754 Autoimmune hepatitis: Secondary | ICD-10-CM | POA: Diagnosis not present

## 2021-07-16 DIAGNOSIS — Z20822 Contact with and (suspected) exposure to covid-19: Secondary | ICD-10-CM | POA: Diagnosis not present

## 2021-07-16 DIAGNOSIS — E875 Hyperkalemia: Secondary | ICD-10-CM

## 2021-07-16 LAB — POTASSIUM: Potassium: 5.1 mEq/L (ref 3.5–5.1)

## 2021-07-16 NOTE — Progress Notes (Signed)
[name], female   DOB: [birth year], 88 y.o.   MRN: [id number] ? ? ?Subjective:  ? ? [name], female    DOB: 1928-10-07, 88 y.o.   MRN: [id number] ? ?This visit occurred during the SARS-CoV-2 public health emergency.  Safety protocols were in place, including screening questions prior to the visit, additional usage of staff PPE, and extensive cleaning of exam room while observing appropriate contact time as indicated for disinfecting solutions.  ? ?Patient here for work in appt.  ? ?Chief Complaint  ?Patient presents with  ? Follow-up  ?  Follow up for shingles  ? .  ? ?HPI ?Was evaluated by Mable Paris 07/11/21 - diagnosed with shingles.  Placed on valtrex.  OTC capasaicin.  Symptoms started one week prior - right leg pain and pain into buttocks.  Noticed rash a couple of days prior to appt.  Comes in today - reports increased pain.  Hard to sit.  Sitting aggravates pain.  Eating.  No nausea or vomiting.  Breathing stable.   ? ? ?Past Medical History:  ?Diagnosis Date  ? (HFpEF) heart failure with preserved ejection fraction ([place])   ? a. 07/2017 Echo: EF 50-55%, no rwma, mild AS/AI, mod MR, mod dil LA, mod TR, PASP 55mHg.  ? Autoimmune hepatitis ([place]   ? followed by Dr SGustavo Lah ? Fibrocystic breast disease   ? Glaucoma   ? Hypercholesterolemia   ? Hypertension   ? Hypothyroidism   ? Inflammatory arthritis   ? [place]) [place]   ? Neuropathy   ? Non-obstructive Coronary artery disease   ? a. 05/2014 NSTEMI/Cath: LM nl, LAD mild dzs, LCX 60ost hazy (? culprit), RCA mild dzs. EF 60% by echo-->Med Rx; b. 08/2017 Cath: LM 30ost, LAD min irregs, LCX small, 40ost/p, RCA large, min irregs, RPDA/RPL1 nl, EF 50-55%. 4+MR.  ? Osteoarthritis   ? Permanent atrial fibrillation ([place]   ? a. CHA2DS2VASc = 6-->Xarelto.  ? PUD (peptic ulcer disease)   ? 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 Severe mitral regurgitation   ? a. 07/2017 Echo: Mod MR; b. 08/2017 Cath:  4+/Severe MR.  ? ?Past Surgical History:  ?Procedure Laterality Date  ? ABDOMINAL HYSTERECTOMY  1963  ? partial, secondary to fibroids  ? APPENDECTOMY    ? Billroth    ? CARDIAC CATHETERIZATION  05/12/14  ? [place] ? CARDIOVERSION N/A 12/26/2016  ? Procedure: CARDIOVERSION;  Surgeon: AWellington Hampshire MD;  Location: [facility];  Service: Cardiovascular;  Laterality: N/A;  ? CARDIOVERSION N/A 05/16/2017  ? Procedure: CARDIOVERSION;  Surgeon: AWellington Hampshire MD;  Location: [facility];  Service: Cardiovascular;  Laterality: N/A;  ? CARDIOVERSION N/A 06/09/2017  ? Procedure: CARDIOVERSION;  Surgeon: AWellington Hampshire MD;  Location: [facility];  Service: Cardiovascular;  Laterality: N/A;  ? CARDIOVERSION N/A 11/30/2018  ? Procedure: CARDIOVERSION;  Surgeon: AWellington Hampshire MD;  Location: [facility];  Service: Cardiovascular;  Laterality: N/A;  ? CHOLECYSTECTOMY  1998  ? COLONOSCOPY  2009  ? RIGHT/LEFT HEART CATH AND CORONARY ANGIOGRAPHY Bilateral 08/21/2017  ? Procedure: RIGHT/LEFT HEART CATH AND CORONARY ANGIOGRAPHY;  Surgeon: AWellington Hampshire MD;  [facility];  Service: Cardiovascular;  Laterality: Bilateral;  ? TEE WITHOUT CARDIOVERSION N/A 08/25/2017  ? Procedure: TRANSESOPHAGEAL ECHOCARDIOGRAM (TEE);  Surgeon: BJolaine Artist MD;  [facility];  Service: Cardiovascular;  Laterality: N/A;  ? UPPER GI ENDOSCOPY  2004  ? ?  Family History  ?Problem Relation Age of Onset  ? Stroke Mother   ? Esophageal cancer Brother   ?     also had lung cancer  ? Ovarian cancer Daughter   ? Colon cancer Daughter   ? Breast cancer Neg Hx   ? ?Social History  ? ?Socioeconomic History  ? Marital status: Widowed  ?  Spouse name: Not on file  ? Number of children: 2  ? Years of education: Not on file  ? Highest education level: Not on file  ?Occupational History  ? Occupation: retired  ?Tobacco Use  ? Smoking status: Never  ? Smokeless tobacco: Never  ?Vaping Use  ? Vaping Use: Never used  ?Substance and Sexual  Activity  ? Alcohol use: No  ?  Alcohol/week: 0.0 standard drinks  ? Drug use: No  ? Sexual activity: Never  ?Other Topics Concern  ? Not on file  ?Social History Narrative  ? Not on file  ? ?Social Determinants of Health  ? ?Financial Resource Strain: Low Risk   ? Difficulty of Paying Living Expenses: Not hard at all  ?Food Insecurity: No Food Insecurity  ? Worried About Charity fundraiser in the Last Year: Never true  ? Ran Out of Food in the Last Year: Never true  ?Transportation Needs: No Transportation Needs  ? Lack of Transportation (Medical): No  ? Lack of Transportation (Non-Medical): No  ?Physical Activity: Insufficiently Active  ? Days of Exercise per Week: 3 days  ? Minutes of Exercise per Session: 40 min  ?Stress: No Stress Concern Present  ? Feeling of Stress : Not at all  ?Social Connections: Unknown  ? Frequency of Communication with Friends and Family: More than three times a week  ? Frequency of Social Gatherings with Friends and Family: More than three times a week  ? Attends Religious Services: Not on file  ? Active Member of Clubs or Organizations: Not on file  ? Attends Archivist Meetings: Not on file  ? Marital Status: Not on file  ? ? ? ?Review of Systems  ?Constitutional:  Negative for chills and fever.  ?HENT:  Negative for congestion and sinus pressure.   ?Respiratory:  Negative for cough, chest tightness and shortness of breath.   ?Cardiovascular:  Negative for chest pain, palpitations and leg swelling.  ?Gastrointestinal:  Negative for abdominal pain, diarrhea, nausea and vomiting.  ?Genitourinary:  Negative for difficulty urinating and dysuria.  ?Musculoskeletal:  Negative for joint swelling and myalgias.  ?     Leg and buttock pain as outlined.   ?Skin:  Positive for rash. Negative for wound.  ?Neurological:  Negative for dizziness and headaches.  ?Psychiatric/Behavioral:  Negative for agitation and dysphoric mood.   ? ?   ?Objective:  ?  ? ?BP 126/70 (BP Location: Left Arm,  Patient Position: Sitting, Cuff Size: Small)   Pulse 86   Temp 98 ?F (36.7 ?C) (Temporal)   Resp 20   Ht '5\' 2"'$  (1.575 m)   Wt 139 lb (63 kg)   SpO2 99%   BMI 25.42 kg/m?  ?Wt Readings from Last 3 Encounters:  ?07/16/21 139 lb (63 kg)  ?07/11/21 140 lb (63.5 kg)  ?06/12/21 141 lb 6 oz (64.1 kg)  ? ? ?Physical Exam ?Vitals reviewed.  ?Constitutional:   ?   General: She is not in acute distress. ?   Appearance: Normal appearance.  ?HENT:  ?   Head: Normocephalic and atraumatic.  ?  Right Ear: External ear normal.  ?   Left Ear: External ear normal.  ?Eyes:  ?   General: No scleral icterus.    ?   Right eye: No discharge.     ?   Left eye: No discharge.  ?   Conjunctiva/sclera: Conjunctivae normal.  ?Neck:  ?   Thyroid: No thyromegaly.  ?Cardiovascular:  ?   Rate and Rhythm: Normal rate and regular rhythm.  ?Pulmonary:  ?   Effort: No respiratory distress.  ?   Breath sounds: Normal breath sounds. No wheezing.  ?Abdominal:  ?   General: Bowel sounds are normal.  ?   Palpations: Abdomen is soft.  ?   Tenderness: There is no abdominal tenderness.  ?Musculoskeletal:     ?   General: No swelling or tenderness.  ?   Cervical back: Neck supple. No tenderness.  ?Lymphadenopathy:  ?   Cervical: No cervical adenopathy.  ?Skin: ?   Comments: Erythematous rash with drying vesicles - right buttock and leg.    ?Neurological:  ?   Mental Status: She is alert.  ?Psychiatric:     ?   Mood and Affect: Mood normal.     ?   Behavior: Behavior normal.  ? ? ? ?Outpatient Encounter Medications as of 07/16/2021  ?Medication Sig  ? allopurinol (ZYLOPRIM) 100 MG tablet TAKE 1/2 TABLET(50 MG) BY MOUTH DAILY  ? colchicine 0.6 MG tablet Take 0.6 mg by mouth daily as needed.  ? CYANOCOBALAMIN PO Take 500 mcg by mouth once a week.  ? diltiazem (CARDIZEM CD) 120 MG 24 hr capsule TAKE 1 CAPSULE(120 MG) BY MOUTH DAILY  ? dorzolamide (TRUSOPT) 2 % ophthalmic solution Place 1 drop into the left eye 2 (two) times daily.  ? fluticasone (FLONASE) 50  MCG/ACT nasal spray Place 2 sprays into both nostrils daily as needed for allergies.   ? fluticasone (FLONASE) 50 MCG/ACT nasal spray Place 1 spray into both nostrils 2 (two) times daily.  ? gabapentin (NEURONTI

## 2021-07-22 NOTE — Assessment & Plan Note (Signed)
Continue torsemide and metoprolol.  Continue xarelto.  No evidence of volume overload on exam.  Breathing stable.  ?

## 2021-07-22 NOTE — Assessment & Plan Note (Signed)
Avoid antiinflammatories.  Follow metabolic panel. Continues on losartan.  ?

## 2021-07-22 NOTE — Assessment & Plan Note (Signed)
Upper symptoms controlled.  On protonix.  

## 2021-07-22 NOTE — Assessment & Plan Note (Signed)
On metoprolol and diltiazem.  Rated controlled.  Continue xarelto.  Continue f/u with cardiology.   ?

## 2021-07-22 NOTE — Assessment & Plan Note (Signed)
Continue losartan, diltiazem and metoprolol.  Blood pressure controlled.  Follow pressure.  Follow metabolic panel.  ?

## 2021-07-22 NOTE — Assessment & Plan Note (Signed)
Recent diagnosis of shingles.  Increased pain. Completing valtrex.  Continue tylenol.  Instructed her to take her tramadol.  Has not been taking.  Follow closely.  ?

## 2021-07-22 NOTE — Assessment & Plan Note (Signed)
Follow liver function tests.  Followed by GI.  

## 2021-07-24 ENCOUNTER — Other Ambulatory Visit: Payer: Self-pay

## 2021-07-24 ENCOUNTER — Telehealth: Payer: Self-pay | Admitting: Internal Medicine

## 2021-07-24 MED ORDER — TRAMADOL HCL 50 MG PO TABS: 50.0000 mg | ORAL_TABLET | Freq: Two times a day (BID) | ORAL | 1 refills | Status: DC | PRN

## 2021-07-24 NOTE — Telephone Encounter (Signed)
Rx sent in for tramadol #60 with one refill.  Please document all medications she is taking for pain  and how taking (including tramadol and gabapentin).  ?

## 2021-07-24 NOTE — Telephone Encounter (Signed)
Noted.  As we discussed, have her decrease tramadol - to bid.   ?

## 2021-07-24 NOTE — Telephone Encounter (Signed)
Pt called stating she has had bad shingles for four weeks.  Pt would like tramadol called in and would to be called by cma ?

## 2021-07-24 NOTE — Telephone Encounter (Signed)
S/w pt - rx Tramadol sent to Dr Nicki Reaper for signature ?

## 2021-07-24 NOTE — Telephone Encounter (Signed)
Pt is taking Tramadol q6hrs per my conversation with her earlier. ?Lm to find out how she is taking Gabapentin, and if she is taking anything else for pain. ?

## 2021-07-30 ENCOUNTER — Ambulatory Visit (INDEPENDENT_AMBULATORY_CARE_PROVIDER_SITE_OTHER): Payer: Medicare Other | Admitting: Internal Medicine

## 2021-07-30 ENCOUNTER — Telehealth: Payer: Self-pay | Admitting: Nurse Practitioner

## 2021-07-30 ENCOUNTER — Encounter: Payer: Self-pay | Admitting: Internal Medicine

## 2021-07-30 VITALS — BP 120/60 | HR 40 | Temp 97.5°F | Resp 12 | Ht 62.0 in | Wt 139.2 lb

## 2021-07-30 DIAGNOSIS — K219 Gastro-esophageal reflux disease without esophagitis: Secondary | ICD-10-CM

## 2021-07-30 DIAGNOSIS — I2510 Atherosclerotic heart disease of native coronary artery without angina pectoris: Secondary | ICD-10-CM | POA: Diagnosis not present

## 2021-07-30 DIAGNOSIS — M810 Age-related osteoporosis without current pathological fracture: Secondary | ICD-10-CM

## 2021-07-30 DIAGNOSIS — E7800 Pure hypercholesterolemia, unspecified: Secondary | ICD-10-CM

## 2021-07-30 DIAGNOSIS — I4819 Other persistent atrial fibrillation: Secondary | ICD-10-CM

## 2021-07-30 DIAGNOSIS — E039 Hypothyroidism, unspecified: Secondary | ICD-10-CM | POA: Diagnosis not present

## 2021-07-30 DIAGNOSIS — B029 Zoster without complications: Secondary | ICD-10-CM

## 2021-07-30 DIAGNOSIS — N1832 Chronic kidney disease, stage 3b: Secondary | ICD-10-CM | POA: Diagnosis not present

## 2021-07-30 DIAGNOSIS — I5032 Chronic diastolic (congestive) heart failure: Secondary | ICD-10-CM | POA: Diagnosis not present

## 2021-07-30 DIAGNOSIS — I10 Essential (primary) hypertension: Secondary | ICD-10-CM | POA: Diagnosis not present

## 2021-07-30 DIAGNOSIS — K743 Primary biliary cirrhosis: Secondary | ICD-10-CM

## 2021-07-30 DIAGNOSIS — K754 Autoimmune hepatitis: Secondary | ICD-10-CM

## 2021-07-30 DIAGNOSIS — R0602 Shortness of breath: Secondary | ICD-10-CM | POA: Diagnosis not present

## 2021-07-30 DIAGNOSIS — I272 Pulmonary hypertension, unspecified: Secondary | ICD-10-CM | POA: Diagnosis not present

## 2021-07-30 DIAGNOSIS — I25118 Atherosclerotic heart disease of native coronary artery with other forms of angina pectoris: Secondary | ICD-10-CM

## 2021-07-30 NOTE — Progress Notes (Unsigned)
Patient ID: AIVA MISKELL, female   DOB: 17-Jun-1928, 88 y.o.   MRN: [id number]   Subjective:    [name], female    DOB: 03/03/1929, 88 y.o.   MRN: [id number]  This visit occurred during the SARS-CoV-2 public health emergency.  Safety protocols were in place, including screening questions prior to the visit, additional usage of staff PPE, and extensive cleaning of exam room while observing appropriate contact time as indicated for disinfecting solutions.   Patient here for  Chief Complaint  Patient presents with   Follow-up   Shortness of Breath   .   HPI    Past Medical History:  Diagnosis Date   (HFpEF) heart failure with preserved ejection fraction ([place])    a. 07/2017 Echo: EF 50-55%, no rwma, mild AS/AI, mod MR, mod dil LA, mod TR, PASP 6mHg.   Autoimmune hepatitis ([place]    followed by Dr SGustavo Lah  Fibrocystic breast disease    Glaucoma    Hypercholesterolemia    Hypertension    Hypothyroidism    Inflammatory arthritis    MI (myocardial infarction) ([facility]    Neuropathy    Non-obstructive Coronary artery disease    a. 05/2014 NSTEMI/Cath: LM nl, LAD mild dzs, LCX 60ost hazy (? culprit), RCA mild dzs. EF 60% by echo-->Med Rx; b. 08/2017 Cath: LM 30ost, LAD min irregs, LCX small, 40ost/p, RCA large, min irregs, RPDA/RPL1 nl, EF 50-55%. 4+MR.   Osteoarthritis    Permanent atrial fibrillation (HCC)    a. CHA2DS2VASc = 6-->Xarelto.   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 07/2017 Echo: Mod MR; b. 08/2017 Cath: 4+/Severe MR.   Past Surgical History:  Procedure Laterality Date   ABDOMINAL HYSTERECTOMY  1963   partial, secondary to fibroids   APPENDECTOMY     Billroth     CARDIAC CATHETERIZATION  [place]12/14   [place]  CARDIOVERSION N/A 12/26/2016   Procedure: CARDIOVERSION;  Surgeon: AWellington Hampshire MD;  Location: [facility];  Service: Cardiovascular;  Laterality: N/A;    CARDIOVERSION N/A 05/16/2017   Procedure: CARDIOVERSION;  Surgeon: AWellington Hampshire MD;  Location: [facility];  Service: Cardiovascular;  Laterality: N/A;   CARDIOVERSION N/A 06/09/2017   Procedure: CARDIOVERSION;  Surgeon: AWellington Hampshire MD;  Location: [facility];  Service: Cardiovascular;  Laterality: N/A;   CARDIOVERSION N/A 11/30/2018   Procedure: CARDIOVERSION;  Surgeon: AWellington Hampshire MD;  Location: [facility];  Service: Cardiovascular;  Laterality: N/A;   CHOLECYSTECTOMY  1998   COLONOSCOPY  2009   RIGHT/LEFT HEART CATH AND CORONARY ANGIOGRAPHY Bilateral 08/21/2017   Procedure: RIGHT/LEFT HEART CATH AND CORONARY ANGIOGRAPHY;  Surgeon: AWellington Hampshire MD;  [facility];  Service: Cardiovascular;  Laterality: Bilateral;   TEE WITHOUT CARDIOVERSION N/A 08/25/2017   Procedure: TRANSESOPHAGEAL ECHOCARDIOGRAM (TEE);  Surgeon: BJolaine Artist MD;  [facility];  Service: Cardiovascular;  Laterality: N/A;   UPPER GI ENDOSCOPY  2004   Family History  Problem Relation Age of Onset   Stroke Mother    Esophageal cancer Brother        also had lung cancer   Ovarian cancer Daughter    Colon cancer Daughter    Breast cancer Neg Hx    Social History   Socioeconomic History   Marital status: Widowed    Spouse name: Not on file   Number of  children: 2   Years of education: Not on file   Highest education level: Not on file  Occupational History   Occupation: retired  Tobacco Use   Smoking status: Never   Smokeless tobacco: Never  Vaping Use   Vaping Use: Never used  Substance and Sexual Activity   Alcohol use: No    Alcohol/week: 0.0 standard drinks   Drug use: No   Sexual activity: Never  Other Topics Concern   Not on file  Social History Narrative   Not on file   Social Determinants of Health   Financial Resource Strain: Low Risk    Difficulty of Paying Living Expenses: Not hard at all  Food Insecurity: No Food Insecurity   Worried About  Charity fundraiser in the Last Year: Never true   [place] in the Last Year: Never true  Transportation Needs: No Transportation Needs   Lack of Transportation (Medical): No   Lack of Transportation (Non-Medical): No  Physical Activity: Insufficiently Active   Days of Exercise per Week: 3 days   Minutes of Exercise per Session: 40 min  Stress: No Stress Concern Present   Feeling of Stress : Not at all  Social Connections: Unknown   Frequency of Communication with Friends and Family: More than three times a week   Frequency of Social Gatherings with Friends and Family: More than three times a week   Attends Religious Services: Not on Electrical engineer or Organizations: Not on file   Attends Archivist Meetings: Not on file   Marital Status: Not on file     Review of Systems     Objective:     BP 120/60 (BP Location: Left Arm, Patient Position: Sitting, Cuff Size: Small)   Pulse (!) 40   Temp (!) 97.5 F (36.4 C) (Temporal)   Resp 12   Ht '5\' 2"'$  (1.575 m)   Wt 139 lb 3.2 oz (63.1 kg)   SpO2 96%   BMI 25.46 kg/m  Wt Readings from Last 3 Encounters:  07/30/21 139 lb 3.2 oz (63.1 kg)  07/16/21 139 lb (63 kg)  07/11/21 140 lb (63.5 kg)    Physical Exam   Outpatient Encounter Medications as of 07/30/2021  Medication Sig   allopurinol (ZYLOPRIM) 100 MG tablet TAKE 1/2 TABLET(50 MG) BY MOUTH DAILY   colchicine 0.6 MG tablet Take 0.6 mg by mouth daily as needed.   CYANOCOBALAMIN PO Take 500 mcg by mouth once a week.   diltiazem (CARDIZEM CD) 120 MG 24 hr capsule TAKE 1 CAPSULE(120 MG) BY MOUTH DAILY   dorzolamide (TRUSOPT) 2 % ophthalmic solution Place 1 drop into the left eye 2 (two) times daily.   fluticasone (FLONASE) 50 MCG/ACT nasal spray Place 2 sprays into both nostrils daily as needed for allergies.    fluticasone (FLONASE) 50 MCG/ACT nasal spray Place 1 spray into both nostrils 2 (two) times daily.   gabapentin (NEURONTIN) 300 MG  capsule One capsule tid and two capsules q hs.   hydroxychloroquine (PLAQUENIL) 200 MG tablet Take 200 mg by mouth every other day.   ipratropium (ATROVENT) 0.03 % nasal spray SMARTSIG:2 Spray(s) Both Nares 3 Times Daily PRN   latanoprost (XALATAN) 0.005 % ophthalmic solution Place 1 drop into both eyes at bedtime.    levothyroxine (SYNTHROID) 112 MCG tablet TAKE 1 TABLET(112 MCG) BY MOUTH DAILY BEFORE BREAKFAST   metoprolol tartrate (LOPRESSOR) 100 MG tablet Take 1.5 tablets (150 mg  total) by mouth 2 (two) times daily.   Multiple Vitamin (MULTI-VITAMIN) tablet Take 1 tablet by mouth at bedtime.    torsemide (DEMADEX) 20 MG tablet ALTERNATE 2 TABLETS BY MOUTH ONE DAY AND 1 TABLET THE NEXT DAY.   traMADol (ULTRAM) 50 MG tablet Take 1 tablet (50 mg total) by mouth 2 (two) times daily as needed (for pain.).   ursodiol (ACTIGALL) 300 MG capsule Take 300-600 mg by mouth See admin instructions. Take 2 capsules (600 mg) daily in the morning   & 1 capsule (300 mg) at night.   XARELTO 15 MG TABS tablet TAKE 1 TABLET(15 MG) BY MOUTH DAILY WITH SUPPER   No facility-administered encounter medications on file as of 07/30/2021.     Lab Results  Component Value Date   WBC 5.8 03/16/2021   HGB 13.2 03/16/2021   HCT 40.5 03/16/2021   PLT 167 03/16/2021   GLUCOSE 95 07/09/2021   CHOL 162 06/05/2021   TRIG 153.0 (H) 06/05/2021   HDL 59.50 06/05/2021   LDLDIRECT 143.5 12/17/2012   LDLCALC 72 06/05/2021   ALT 19 06/05/2021   AST 16 06/05/2021   NA 141 07/09/2021   K 5.1 07/16/2021   CL 109 07/09/2021   CREATININE 1.32 (H) 07/09/2021   BUN 27 (H) 07/09/2021   CO2 23 07/09/2021   TSH 1.85 06/05/2021   INR 1.63 12/29/2017   HGBA1C 5.6 01/27/2018       Assessment & Plan:   Problem List Items Addressed This Visit     Dyspnea - Primary   Relevant Orders   EKG 12-Lead (Completed)   CBC w/Diff   TSH   Basic Metabolic Panel (BMET)   T4, free   Hypothyroidism   Relevant Orders   TSH   T4, free    Persistent atrial fibrillation (HCC)     Einar Pheasant, MD

## 2021-07-30 NOTE — Patient Instructions (Signed)
Hold diltiazem.   Decrease metoprolol to '100mg'$  twice a day.   Do not take evening dose today.

## 2021-07-30 NOTE — Telephone Encounter (Signed)
Attempted to schedule appt .

## 2021-07-31 ENCOUNTER — Encounter: Payer: Self-pay | Admitting: Internal Medicine

## 2021-07-31 ENCOUNTER — Encounter: Payer: Self-pay | Admitting: Nurse Practitioner

## 2021-07-31 ENCOUNTER — Ambulatory Visit (INDEPENDENT_AMBULATORY_CARE_PROVIDER_SITE_OTHER): Payer: Medicare Other

## 2021-07-31 ENCOUNTER — Ambulatory Visit (INDEPENDENT_AMBULATORY_CARE_PROVIDER_SITE_OTHER): Payer: Medicare Other | Admitting: Nurse Practitioner

## 2021-07-31 VITALS — BP 128/70 | HR 51 | Ht 62.0 in | Wt 139.1 lb

## 2021-07-31 DIAGNOSIS — N183 Chronic kidney disease, stage 3 unspecified: Secondary | ICD-10-CM

## 2021-07-31 DIAGNOSIS — I5032 Chronic diastolic (congestive) heart failure: Secondary | ICD-10-CM

## 2021-07-31 DIAGNOSIS — I340 Nonrheumatic mitral (valve) insufficiency: Secondary | ICD-10-CM

## 2021-07-31 DIAGNOSIS — E785 Hyperlipidemia, unspecified: Secondary | ICD-10-CM

## 2021-07-31 DIAGNOSIS — I10 Essential (primary) hypertension: Secondary | ICD-10-CM

## 2021-07-31 DIAGNOSIS — E039 Hypothyroidism, unspecified: Secondary | ICD-10-CM | POA: Diagnosis not present

## 2021-07-31 DIAGNOSIS — I4811 Longstanding persistent atrial fibrillation: Secondary | ICD-10-CM

## 2021-07-31 DIAGNOSIS — I2510 Atherosclerotic heart disease of native coronary artery without angina pectoris: Secondary | ICD-10-CM

## 2021-07-31 LAB — BASIC METABOLIC PANEL
BUN: 49 mg/dL — ABNORMAL HIGH (ref 6–23)
CO2: 23 mEq/L (ref 19–32)
Calcium: 8.4 mg/dL (ref 8.4–10.5)
Chloride: 105 mEq/L (ref 96–112)
Creatinine, Ser: 1.83 mg/dL — ABNORMAL HIGH (ref 0.40–1.20)
GFR: 23.91 mL/min — ABNORMAL LOW (ref >60.00)
Glucose, Bld: 78 mg/dL (ref 70–99)
Potassium: 4.8 mEq/L (ref 3.5–5.1)
Sodium: 137 mEq/L (ref 135–145)

## 2021-07-31 LAB — CBC WITH DIFFERENTIAL/PLATELET
Basophils Absolute: 0.1 10*3/uL (ref 0.0–0.1)
Basophils Relative: 1 % (ref 0.0–3.0)
Eosinophils Absolute: 0.1 10*3/uL (ref 0.0–0.7)
Eosinophils Relative: 2.3 % (ref 0.0–5.0)
HCT: 38.1 % (ref 36.0–46.0)
Hemoglobin: 12 g/dL (ref 12.0–15.0)
Lymphocytes Relative: 12.3 % (ref 12.0–46.0)
Lymphs Abs: 0.8 10*3/uL (ref 0.7–4.0)
MCHC: 31.6 g/dL (ref 30.0–36.0)
MCV: 106.9 fl — ABNORMAL HIGH (ref 78.0–100.0)
Monocytes Absolute: 0.7 10*3/uL (ref 0.1–1.0)
Monocytes Relative: 10.7 % (ref 3.0–12.0)
Neutro Abs: 4.8 10*3/uL (ref 1.4–7.7)
Neutrophils Relative %: 73.7 % (ref 43.0–77.0)
Platelets: 192 10*3/uL (ref 150.0–400.0)
RBC: 3.56 Mil/uL — ABNORMAL LOW (ref 3.87–5.11)
RDW: 16.6 % — ABNORMAL HIGH (ref 11.5–15.5)
WBC: 6.5 10*3/uL (ref 4.0–10.5)

## 2021-07-31 LAB — T4, FREE: Free T4: 1.02 ng/dL (ref 0.60–1.60)

## 2021-07-31 LAB — TSH: TSH: 4.15 u[IU]/mL (ref 0.35–5.50)

## 2021-07-31 MED ORDER — TORSEMIDE 20 MG PO TABS: 20.0000 mg | ORAL_TABLET | ORAL | 3 refills | Status: DC

## 2021-07-31 MED ORDER — METOPROLOL TARTRATE 50 MG PO TABS: 50.0000 mg | ORAL_TABLET | Freq: Two times a day (BID) | ORAL | 3 refills | Status: DC

## 2021-07-31 NOTE — Assessment & Plan Note (Signed)
Continue torsemide and metoprolol.  Continue xarelto.  SOB - feel related to bradycardia.  No evidence of volume overload on exam. Weight stable.  Check met b, cbc and thyroid function tests.

## 2021-07-31 NOTE — Assessment & Plan Note (Signed)
SOB on exertion as outlined.  EKG SR/SB with ventricular rate 44.  Hold diltiazem.  Metoprolol change to 133m bid.  appt tomorrow with cardiology.  No evidence of volume overload on exam.  Weight stable.  Check cbc, met b and thyroid function tests. Discussed the need to monitor for increased heart rate, given reduction in beta blocker and with holding diltiazem.  Discussed possible need for EP evaluation if tachy/brady.

## 2021-07-31 NOTE — Assessment & Plan Note (Signed)
Followed by GI.  Stable.  Follow liver function tests.  

## 2021-07-31 NOTE — Assessment & Plan Note (Signed)
Avoid antiinflammatories.  Follow metabolic panel. Continues on losartan.

## 2021-07-31 NOTE — Addendum Note (Signed)
Addended by: Valora Corporal on: 07/31/2021 04:28 PM   Modules accepted: Orders

## 2021-07-31 NOTE — Patient Instructions (Signed)
Medication Instructions:  Your physician has recommended you make the following change in your medication:   DECREASE Metoprolol tartrate to 50 mg twice a day  *If you need a refill on your cardiac medications before your next appointment, please call your pharmacy*   Lab Work: None  If you have labs (blood work) drawn today and your tests are completely normal, you will receive your results only by: [place] (if you have MyChart) OR A paper copy in the mail If you have any lab test that is abnormal or we need to change your treatment, we will call you to review the results.   Testing/Procedures: Your provider has ordered a heart monitor to wear for 14 days. This will be mailed to your home with instructions on placement. Once you have finished the time frame requested, you will return monitor in box provided.      Follow-Up: At [facility], you and your health needs are our priority.  As part of our continuing mission to provide you with exceptional heart care, we have created designated Provider Care Teams.  These Care Teams include your primary Cardiologist (physician) and Advanced Practice Providers (APPs -  Physician Assistants and Nurse Practitioners) who all work together to provide you with the care you need, when you need it.  We recommend signing up for the patient portal called "MyChart".  Sign up information is provided on this After Visit Summary.  MyChart is used to connect with patients for Virtual Visits (Telemedicine).  Patients are able to view lab/test results, encounter notes, upcoming appointments, etc.  Non-urgent messages can be sent to your provider as well.   To learn more about what you can do with MyChart, go to [email or web address].    Your next appointment:   2 week(s)  Schedule for August 15, 2021 at 8:50 am per Ignacia Bayley NP  The format for your next appointment:   In Person  Provider:   Murray Hodgkins, NP       Important  Information About Sugar

## 2021-07-31 NOTE — Assessment & Plan Note (Signed)
Increased sob on exertion.  EKG - SR/SB with ventricular rate 44.  Weight stable.  No evidence of volume overload on exam.  Lungs clear. No cough or congestion.  Decrease metoprolol to '100mg'$  bid.  Hold diltiazem.  appt tomorrow with cardiology.  Follow.

## 2021-07-31 NOTE — Progress Notes (Signed)
Office Visit    Patient Name: Ruth Roth Date of Encounter: 07/31/2021  Primary Care Provider:  Einar Pheasant, MD Primary Cardiologist:  [name], MD  Chief Complaint    88 year old female with a history of nonobstructive CAD, chronic HFpEF, mitral regurgitation, permanent atrial fibrillation, hypertension, hyperlipidemia, stage III chronic kidney disease, hypothyroidism, peptic ulcer disease, dumping syndrome, arthritis, neuropathy, fibrocystic breast disease, and autoimmune hepatitis, who presents for follow-up related to dyspnea.  Past Medical History    Past Medical History:  Diagnosis Date   (HFpEF) heart failure with preserved ejection fraction ([place])    a. 07/2017 Echo: EF 50-55%, no rwma, mild AS/AI, mod MR, mod dil LA, mod TR, PASP 69mHg; b. 03/2021 Echo: EF 60-65%. No rwma. Nl RV fxn. Sev BAE. Mild MR. Mild AI/AS.   Autoimmune hepatitis ([place]    followed by Dr SGustavo Lah  Fibrocystic breast disease    Glaucoma    Hypercholesterolemia    Hypertension    Hypothyroidism    Inflammatory arthritis    MI (myocardial infarction) ([facility]    Mitral regurgitation    a. 07/2017 Echo: Mod MR; b. 08/2017 Cath: 4+/Severe MR; c. 08/2017 TEE: Mild MR; d. 03/2021 Echo: Mild MR.   Neuropathy    Non-obstructive Coronary artery disease    a. 05/2014 NSTEMI/Cath: LM nl, LAD mild dzs, LCX 60ost hazy (? culprit), RCA mild dzs. EF 60% by echo-->Med Rx; b. 08/2017 Cath: LM 30ost, LAD min irregs, LCX small, 40ost/p, RCA large, min irregs, RPDA/RPL1 nl, EF 50-55%. 4+MR.   Osteoarthritis    Permanent atrial fibrillation (HCC)    a. CHA2DS2VASc = 6-->Xarelto.   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   Past Surgical History:  Procedure Laterality Date   ABDOMINAL HYSTERECTOMY  1963   partial, secondary to fibroids   APPENDECTOMY     Billroth     CARDIAC CATHETERIZATION  05/12/14   [facility]   CARDIOVERSION N/A 12/26/2016   Procedure:  CARDIOVERSION;  Surgeon: AWellington Hampshire MD;  Location: [facility];  Service: Cardiovascular;  Laterality: N/A;   CARDIOVERSION N/A 05/16/2017   Procedure: CARDIOVERSION;  Surgeon: AWellington Hampshire MD;  Location: [facility];  Service: Cardiovascular;  Laterality: N/A;   CARDIOVERSION N/A 06/09/2017   Procedure: CARDIOVERSION;  Surgeon: AWellington Hampshire MD;  Location: [facility];  Service: Cardiovascular;  Laterality: N/A;   CARDIOVERSION N/A 11/30/2018   Procedure: CARDIOVERSION;  Surgeon: AWellington Hampshire MD;  Location: [facility];  Service: Cardiovascular;  Laterality: N/A;   CHOLECYSTECTOMY  1998   COLONOSCOPY  2009   RIGHT/LEFT HEART CATH AND CORONARY ANGIOGRAPHY Bilateral 08/21/2017   Procedure: RIGHT/LEFT HEART CATH AND CORONARY ANGIOGRAPHY;  Surgeon: AWellington Hampshire MD;  [facility];  Service: Cardiovascular;  Laterality: Bilateral;   TEE WITHOUT CARDIOVERSION N/A 08/25/2017   Procedure: TRANSESOPHAGEAL ECHOCARDIOGRAM (TEE);  Surgeon: BJolaine Artist MD;  [facility];  Service: Cardiovascular;  Laterality: N/A;   UPPER GI ENDOSCOPY  2004    Allergies  Allergies  Allergen Reactions   Statins Other (See Comments)    Affect her liver, Caused liver problems    Haldol [Haloperidol Lactate] Rash   Librax [Chlordiazepoxide-Clidinium] Rash   Plavix [Clopidogrel Bisulfate] Rash   Ramipril Rash    History of Present Illness    88year old female with a history of nonobstructive CAD, chronic HFpEF, mitral regurgitation, permanent atrial fibrillation, hypertension, hyperlipidemia, stage III chronic  kidney disease, hypothyroidism, peptic ulcer disease, dumping syndrome, arthritis, neuropathy, fibrocystic breast disease, and autoimmune hepatitis.  In March 2016, she suffered a non-STEMI in the setting of acute diastolic heart failure following a GI illness.  Echocardiogram showed normal LV function.  Diagnostic catheterization showed a 6% ostial left circumflex  hazy stenosis, which was possibly the culprit.  She otherwise had mild LAD and RCA disease, and she was medically managed.  In 2019, she had worsening heart failure and underwent right and left heart diagnostic catheterization in June 2019, showing mild, nonobstructive CAD, and EF of 50 to 55%, and severe mitral regurgitation.  Right heart catheterization at [facility] showed severely elevated filling pressures with moderate to severe pulmonary hypertension and severely reduced cardiac output.  She was transferred to [facility], and evaluated by our advanced heart failure team.  She was placed on milrinone and effectively diuresed.  Transesophageal echocardiogram showed normal LV function and only mild mitral regurgitation.  It was felt that MR was functional in the setting of volume overload.  She has had multiple subsequent echocardiograms with her most recent echo in January 2023 showing an EF of 60 to 65% without regional wall motion abnormalities, normal RV function severe biatrial enlargement, mild mitral regurgitation, mild AI, and mild aortic stenosis.  [name] was last seen in cardiology clinic in April 2023, at which time she was felt to be euvolemic and feeling well.  Since then however, she developed shingles with pain in her right posterior leg and foot.  She has been managing this with tramadol and Tylenol.  She was seen by primary care on May 22 with complaints of dyspnea, was noted to be bradycardic at 44 bpm.  Though she was previously felt to be permanent atrial fibrillation, she was in sinus rhythm.  Diltiazem was discontinued and metoprolol was reduced to 100 twice daily.  She did not take metoprolol last night or this morning.  She feels a little bit better this morning but did feel little bit of dyspnea when she was in the shower.  She denies palpitations, chest pain, presyncope, syncope, edema, or early satiety.  Home Medications    Current Outpatient Medications  Medication Sig Dispense  Refill   allopurinol (ZYLOPRIM) 100 MG tablet TAKE 1/2 TABLET(50 MG) BY MOUTH DAILY 45 tablet 1   colchicine 0.6 MG tablet Take 0.6 mg by mouth daily as needed.     CYANOCOBALAMIN PO Take 500 mcg by mouth once a week.     dorzolamide (TRUSOPT) 2 % ophthalmic solution Place 1 drop into the left eye 2 (two) times daily.     fluticasone (FLONASE) 50 MCG/ACT nasal spray Place 2 sprays into both nostrils daily as needed for allergies.      gabapentin (NEURONTIN) 300 MG capsule One capsule tid and two capsules q hs. 150 capsule 2   hydroxychloroquine (PLAQUENIL) 200 MG tablet Take 200 mg by mouth every other day.     ipratropium (ATROVENT) 0.03 % nasal spray SMARTSIG:2 Spray(s) Both Nares 3 Times Daily PRN     latanoprost (XALATAN) 0.005 % ophthalmic solution Place 1 drop into both eyes at bedtime.      levothyroxine (SYNTHROID) 112 MCG tablet TAKE 1 TABLET(112 MCG) BY MOUTH DAILY BEFORE BREAKFAST 90 tablet 2   metoprolol tartrate (LOPRESSOR) 50 MG tablet Take 1 tablet (50 mg total) by mouth 2 (two) times daily. 180 tablet 3   Multiple Vitamin (MULTI-VITAMIN) tablet Take 1 tablet by mouth at bedtime.  traMADol (ULTRAM) 50 MG tablet Take 1 tablet (50 mg total) by mouth 2 (two) times daily as needed (for pain.). 60 tablet 1   ursodiol (ACTIGALL) 300 MG capsule Take 300-600 mg by mouth See admin instructions. Take 2 capsules (600 mg) daily in the morning   & 1 capsule (300 mg) at night.     XARELTO 15 MG TABS tablet TAKE 1 TABLET(15 MG) BY MOUTH DAILY WITH SUPPER 90 tablet 1   torsemide (DEMADEX) 20 MG tablet Take 1 tablet (20 mg total) by mouth every other day. ALTERNATE 2 TABLETS BY MOUTH ONE DAY AND 1 TABLET THE NEXT DAY. 45 tablet 3   No current facility-administered medications for this visit.     Review of Systems    Recent fatigue with dyspnea in the setting of bradycardia.  Slightly better today.  She denies chest pain, palpitations, PND, orthopnea, dizziness, syncope, edema, or early  satiety.  All other systems reviewed and are otherwise negative except as noted above.    Physical Exam    VS:  BP 128/70 (BP Location: Left Arm, Patient Position: Sitting, Cuff Size: Normal)   Pulse (!) 51   Ht '5\' 2"'$  (1.575 m)   Wt 139 lb 2 oz (63.1 kg)   SpO2 97%   BMI 25.45 kg/m  , BMI Body mass index is 25.45 kg/m.     GEN: Well nourished, well developed, in no acute distress. HEENT: normal. Neck: Supple, no JVD, carotid bruits, or masses. Cardiac: RRR, 2/6 systolic murmur heard throughout, loudest at the upper sternal borders.  No rubs or gallops. No clubbing, cyanosis, edema.  Radials 2+/PT 1+ and equal bilaterally.  Respiratory:  Respirations regular and unlabored, clear to auscultation bilaterally. GI: Soft, nontender, nondistended, BS + x 4. MS: no deformity or atrophy. Skin: warm and dry, no rash. Neuro:  Strength and sensation are intact. Psych: Normal affect.  Accessory Clinical Findings    ECG personally reviewed by me today -sinus bradycardia, 51, prolonged QT, nonspecific T changes- no acute changes.  Lab Results  Component Value Date   WBC 6.5 07/30/2021   HGB 12.0 07/30/2021   HCT 38.1 07/30/2021   MCV 106.9 (H) 07/30/2021   PLT 192.0 07/30/2021   Lab Results  Component Value Date   CREATININE 1.83 (H) 07/30/2021   BUN 49 (H) 07/30/2021   NA 137 07/30/2021   K 4.8 07/30/2021   CL 105 07/30/2021   CO2 23 07/30/2021   Lab Results  Component Value Date   ALT 19 06/05/2021   AST 16 06/05/2021   ALKPHOS 115 06/05/2021   BILITOT 0.4 06/05/2021   Lab Results  Component Value Date   CHOL 162 06/05/2021   HDL 59.50 06/05/2021   LDLCALC 72 06/05/2021   LDLDIRECT 143.5 12/17/2012   TRIG 153.0 (H) 06/05/2021   CHOLHDL 3 06/05/2021    Lab Results  Component Value Date   HGBA1C 5.6 01/27/2018   Lab Results  Component Value Date   TSH 4.15 07/30/2021    Assessment & Plan    1.  Sinus bradycardia/longstanding persistent atrial fibrillation:  Patient with a history of atrial fibrillation dating back to 2019.  Historically, she has been well rate controlled on beta-blocker and calcium channel blocker therapy, including at her last cardiology visit in April.  2 days ago, she felt very fatigued and noted dyspnea with activity.  She was seen by primary care yesterday and noted to be in sinus bradycardia at a rate of 44  bpm.  She had already taken 150 mg of metoprolol and 120 mg of diltiazem that day.  She was advised to hold her evening dose and follow-up here today.  She has yet to take metoprolol yet today.  Heart rate 51 this morning.  She is feeling better but did feel little bit short of breath with her shower this morning.  ECG shows sinus bradycardia with nonspecific T changes.  QTc is mildly prolonged at 462.  She did have labs drawn yesterday-potassium normal.  I advised that she should reduce her metoprolol to 50 mg twice daily and to hold for heart rate less than 50.  She is to remain off of diltiazem therapy and is otherwise anticoagulated with reduced dose Xarelto.  I am going to place a ZIO monitor for the next 2 weeks to evaluate for higher grades of heart block as well as assess heart rate excursions and for recurrence of atrial fibrillation, which I suspect will occur.  I do have some concern that she may develop tachybrady syndrome, at which time she will require EP evaluation.  2.  Chronic HFpEF: Echo earlier this year with normal LV function and mild MR/AI/AS.  Weight has been stable at home and she is euvolemic on examination today.  She remains on torsemide 20 mg every other day, and about once a month will use an additional tablet for weight gain.  Heart rate and blood pressure are controlled-adjustment to metoprolol as outlined above.  3.  Essential hypertension: Stable.  4.  Coronary artery disease: Nonobstructive CAD by catheterization 2019.  She has not been experiencing any chest pain.  Normal LV function by echo earlier  this year.  If fatigue/dyspnea persist despite improvement in heart rate, will need to reconsider ischemic evaluation.  No statin in the setting of intolerance, though LDL was 72 in March.  5.  Hypothyroidism: She is on Synthroid.  TSH just returned at 4.15.  6.  Stage III chronic kidney disease: Creatinine slightly higher than usual baseline at 1.83.  Currently, she is taking torsemide 20 mg every other day and weight has been stable and she is euvolemic on exam.  No change to dose today.  7.  Much regurgitation: Initially felt to be severe back in 2019 but following diuresis, noted to be mild.  MR remains mild on most recent echo and severity is largely felt to be functional in nature.  8.  Disposition: Follow-up event monitoring.  Follow-up in clinic in 2 weeks or sooner if necessary.  Murray Hodgkins, NP 07/31/2021, 12:42 PM

## 2021-07-31 NOTE — Assessment & Plan Note (Signed)
On thyroid replacement.  Follow tsh. Given bradycardia on exam, check TSH and Free T4 today.

## 2021-07-31 NOTE — Assessment & Plan Note (Signed)
Upper symptoms controlled.  On protonix.  

## 2021-07-31 NOTE — Assessment & Plan Note (Signed)
Healing.  Persistent pain.  Taking tramadol.  Is better today.  Follow.

## 2021-07-31 NOTE — Assessment & Plan Note (Signed)
Blood pressure controlled.  Continue losartan.  Stop diltiazem as outlined. Decrease metoprolol to '100mg'$  bid. Follow pressure.  Follow metabolic panel.

## 2021-07-31 NOTE — Assessment & Plan Note (Signed)
Has been in afib - on metoprolol and diltiazem.  EKG today - SR/SB with ventricular rate 44.  SOB as outlined.  Will hold diltiazem.  Decrease metoprolol to '100mg'$  bid.  Discussed with cardiology.  appt tomorrow with cardiology.  Continue xarelto.

## 2021-07-31 NOTE — Assessment & Plan Note (Signed)
Followed by cardiology 

## 2021-07-31 NOTE — Assessment & Plan Note (Signed)
Unable to take statin medication.  Follow lipid panel.  

## 2021-07-31 NOTE — Assessment & Plan Note (Signed)
Continue risk factor modification.  Continue metoprolol (adjusted dose) and losartan.

## 2021-08-01 NOTE — Assessment & Plan Note (Signed)
prolia - last 10/11/20. Overdue.  Need to schedule

## 2021-08-01 NOTE — Assessment & Plan Note (Signed)
Follow liver function tests.  Followed by GI.  

## 2021-08-03 ENCOUNTER — Telehealth: Payer: Self-pay | Admitting: *Deleted

## 2021-08-03 ENCOUNTER — Other Ambulatory Visit: Payer: Self-pay

## 2021-08-03 DIAGNOSIS — I4811 Longstanding persistent atrial fibrillation: Secondary | ICD-10-CM

## 2021-08-03 DIAGNOSIS — I272 Pulmonary hypertension, unspecified: Secondary | ICD-10-CM

## 2021-08-03 NOTE — Telephone Encounter (Signed)
Please place future orders for lab appt.  

## 2021-08-03 NOTE — Telephone Encounter (Signed)
Bmet ordered

## 2021-08-07 ENCOUNTER — Telehealth: Payer: Self-pay | Admitting: Nurse Practitioner

## 2021-08-07 MED ORDER — METOPROLOL TARTRATE 100 MG PO TABS: 100.0000 mg | ORAL_TABLET | Freq: Two times a day (BID) | ORAL | 3 refills | Status: DC

## 2021-08-07 NOTE — Telephone Encounter (Signed)
I suspect that she's back in atrial fibrillation.  She should take an additional metoprolol '50mg'$  this morning and plan to take '100mg'$  this afternoon.

## 2021-08-07 NOTE — Telephone Encounter (Signed)
Spoke with patient and reviewed provider recommendations to take additional 50 mg metoprolol now and then 100 mg tonight. Advised I would get further clarification on continued doses and be in touch. She verbalized understanding with no further questions at this time.

## 2021-08-07 NOTE — Telephone Encounter (Signed)
   Pt c/o BP issue: STAT if pt c/o blurred vision, one-sided weakness or slurred speech  1. What are your last 5 BP readings? BP 161/110 HR 126  2. Are you having any other symptoms (ex. Dizziness, headache, blurred vision, passed out)?   3. What is your BP issue? Pt said, his BP and HR is elevated. She is still waiting for her new heart monitor. She wanted to know if she needs to increase her meds

## 2021-08-07 NOTE — Telephone Encounter (Signed)
Spoke with patient and she reports elevated blood pressures. Had her recheck while on phone and reading was 173/110 with HR of 128. She wanted to know if we should adjust her medications given these elevated readings. At her last visit her metoprolol was adjusted. Please review and advise.

## 2021-08-07 NOTE — Telephone Encounter (Signed)
Called and spoke with patient to review additional instructions to increase metoprolol to 100 mg twice daily and to monitor her heart rates and give [place] a call if they are persistently low. She verbalized understanding of these additional instructions with no further questions at this time.    Per Secure Chat with provider Murray Hodgkins NP we cut her back to 50 bid when I saw her.  she used to be on 100 bid and also dilt, but we had to drop stuff b/c she converted to sinus brady and felt bad.  so yeah, she should go back to 100 bid on metop but will need to let us know asap if hr's drop again.

## 2021-08-09 ENCOUNTER — Other Ambulatory Visit: Payer: Medicare Other

## 2021-08-10 ENCOUNTER — Ambulatory Visit (INDEPENDENT_AMBULATORY_CARE_PROVIDER_SITE_OTHER): Payer: Medicare Other | Admitting: Internal Medicine

## 2021-08-10 ENCOUNTER — Telehealth: Payer: Self-pay | Admitting: Internal Medicine

## 2021-08-10 ENCOUNTER — Other Ambulatory Visit (INDEPENDENT_AMBULATORY_CARE_PROVIDER_SITE_OTHER): Payer: Medicare Other

## 2021-08-10 ENCOUNTER — Telehealth: Payer: Self-pay | Admitting: *Deleted

## 2021-08-10 DIAGNOSIS — R300 Dysuria: Secondary | ICD-10-CM

## 2021-08-10 DIAGNOSIS — I10 Essential (primary) hypertension: Secondary | ICD-10-CM

## 2021-08-10 DIAGNOSIS — N3000 Acute cystitis without hematuria: Secondary | ICD-10-CM | POA: Diagnosis not present

## 2021-08-10 DIAGNOSIS — I4819 Other persistent atrial fibrillation: Secondary | ICD-10-CM

## 2021-08-10 DIAGNOSIS — R350 Frequency of micturition: Secondary | ICD-10-CM | POA: Diagnosis not present

## 2021-08-10 DIAGNOSIS — I272 Pulmonary hypertension, unspecified: Secondary | ICD-10-CM

## 2021-08-10 LAB — POCT URINALYSIS DIP (MANUAL ENTRY)
Bilirubin, UA: NEGATIVE
Glucose, UA: NEGATIVE mg/dL
Ketones, POC UA: NEGATIVE mg/dL
Nitrite, UA: NEGATIVE
Protein Ur, POC: 100 mg/dL — AB
Spec Grav, UA: 1.02 (ref 1.010–1.025)
Urobilinogen, UA: 0.2 E.U./dL
pH, UA: 5.5 (ref 5.0–8.0)

## 2021-08-10 LAB — BASIC METABOLIC PANEL
BUN: 25 mg/dL — ABNORMAL HIGH (ref 6–23)
CO2: 23 mEq/L (ref 19–32)
Calcium: 9.6 mg/dL (ref 8.4–10.5)
Chloride: 106 mEq/L (ref 96–112)
Creatinine, Ser: 1.15 mg/dL (ref 0.40–1.20)
GFR: 41.74 mL/min — ABNORMAL LOW (ref >60.00)
Glucose, Bld: 77 mg/dL (ref 70–99)
Potassium: 5 mEq/L (ref 3.5–5.1)
Sodium: 137 mEq/L (ref 135–145)

## 2021-08-10 MED ORDER — ALIGN 4 MG PO CAPS
ORAL_CAPSULE | ORAL | 0 refills | Status: DC
Start: 2021-08-10 — End: 2023-05-23

## 2021-08-10 MED ORDER — CEFDINIR 300 MG PO CAPS: 300.0000 mg | ORAL_CAPSULE | Freq: Two times a day (BID) | ORAL | 0 refills | Status: DC

## 2021-08-10 NOTE — Telephone Encounter (Signed)
Pt was here today to repeat BMP & mentioned that she is coming in on Tuesday for a possible UTI. I went ahead and collected a urine specimen since she stated she has never had a UTI before & not quite sure what the symptoms are.  Pt c/o urinary burning since Monday, worse by Tuesday. Increased urinary frequency & urgency. No unusual odor but noticed that her urine has a darker orange tint. She has no taking anything otc to help with symptoms.  NOTE: I have pended 4 tests for you to choose from if you would like to test her urine today. Thanks

## 2021-08-10 NOTE — Progress Notes (Signed)
[name], female   DOB: [birth year], 88 y.o.   MRN: [id number]   Virtual Visit via video Note  All issues noted in this document were discussed and addressed.  No physical exam was performed (except for noted visual exam findings with Video Visits).   I connected with Ruth Roth by a video enabled telemedicine application and verified that I am speaking with the correct person using two identifiers. Location patient: home Location provider: work  Persons participating in the virtual visit: patient, provider  The limitations, risks, security and privacy concerns of performing an evaluation and management service by video and the availability of in person appointments have been discussed.  It has also been discussed with the patient that there may be a patient responsible charge related to this service. The patient expressed understanding and agreed to proceed.   Reason for visit: work in appt  HPI: Work in with concerns regarding a possible UTI.  States symptoms started Monday (four days ago).  Reported increased frequency.  Dysuria.  Urgency.  Some occasional dribbling.  Dark urine.  No fever. Some previous diarrhea.  No vomiting.  No abdominal pain or back pain.     ROS: See pertinent positives and negatives per HPI.  Past Medical History:  Diagnosis Date   (HFpEF) heart failure with preserved ejection fraction ([place])    a. 07/2017 Echo: EF 50-55%, no rwma, mild AS/AI, mod MR, mod dil LA, mod TR, PASP 86mHg; b. 03/2021 Echo: EF 60-65%. No rwma. Nl RV fxn. Sev BAE. Mild MR. Mild AI/AS.   Autoimmune hepatitis ([place]    followed by Dr SGustavo Lah  Fibrocystic breast disease    Glaucoma    Hypercholesterolemia    Hypertension    Hypothyroidism    Inflammatory arthritis    MI (myocardial infarction) ([place]    Mitral regurgitation    a. 07/2017 Echo: Mod MR; b. 08/2017 Cath: 4+/Severe MR; c. 08/2017 TEE: Mild MR; d. 03/2021 Echo: Mild MR.   Neuropathy    Non-obstructive Coronary  artery disease    a. 05/2014 NSTEMI/Cath: LM nl, LAD mild dzs, LCX 60ost hazy (? culprit), RCA mild dzs. EF 60% by echo-->Med Rx; b. 08/2017 Cath: LM 30ost, LAD min irregs, LCX small, 40ost/p, RCA large, min irregs, RPDA/RPL1 nl, EF 50-55%. 4+MR.   Osteoarthritis    Permanent atrial fibrillation (HCC)    a. CHA2DS2VASc = 6-->Xarelto.   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    Past Surgical History:  Procedure Laterality Date   ABDOMINAL HYSTERECTOMY  1963   partial, secondary to fibroids   APPENDECTOMY     Billroth     CARDIAC CATHETERIZATION  05/12/14   [facility]   CARDIOVERSION N/A 12/26/2016   Procedure: CARDIOVERSION;  Surgeon: AWellington Hampshire MD;  Location: [facility] ORS;  Service: Cardiovascular;  Laterality: N/A;   CARDIOVERSION N/A 05/16/2017   Procedure: CARDIOVERSION;  Surgeon: AWellington Hampshire MD;  Location: [facility] ORS;  Service: Cardiovascular;  Laterality: N/A;   CARDIOVERSION N/A 06/09/2017   Procedure: CARDIOVERSION;  Surgeon: AWellington Hampshire MD;  Location: [facility] ORS;  Service: Cardiovascular;  Laterality: N/A;   CARDIOVERSION N/A 11/30/2018   Procedure: CARDIOVERSION;  Surgeon: AWellington Hampshire MD;  Location: [facility] ORS;  Service: Cardiovascular;  Laterality: N/A;   CHOLECYSTECTOMY  1998   COLONOSCOPY  2009   RIGHT/LEFT HEART CATH AND CORONARY ANGIOGRAPHY Bilateral 08/21/2017   Procedure: RIGHT/LEFT HEART CATH AND CORONARY  ANGIOGRAPHY;  Surgeon: [name], MD;  Location: [facility];  Service: Cardiovascular;  Laterality: Bilateral;   TEE WITHOUT CARDIOVERSION N/A 08/25/2017   Procedure: TRANSESOPHAGEAL ECHOCARDIOGRAM (TEE);  Surgeon: Jolaine Artist, MD;  Location: [facility] ENDOSCOPY;  Service: Cardiovascular;  Laterality: N/A;   UPPER GI ENDOSCOPY  2004    Family History  Problem Relation Age of Onset   Stroke Mother    Esophageal cancer Brother        also had lung cancer   Ovarian cancer Daughter    Colon  cancer Daughter    Breast cancer Neg Hx     SOCIAL HX: reviewed   Current Outpatient Medications:    allopurinol (ZYLOPRIM) 100 MG tablet, TAKE 1/2 TABLET(50 MG) BY MOUTH DAILY, Disp: 45 tablet, Rfl: 1   cefdinir (OMNICEF) 300 MG capsule, Take 1 capsule (300 mg total) by mouth 2 (two) times daily., Disp: 10 capsule, Rfl: 0   colchicine 0.6 MG tablet, Take 0.6 mg by mouth daily as needed., Disp: , Rfl:    CYANOCOBALAMIN PO, Take 500 mcg by mouth once a week., Disp: , Rfl:    dorzolamide (TRUSOPT) 2 % ophthalmic solution, Place 1 drop into the left eye 2 (two) times daily., Disp: , Rfl:    fluticasone (FLONASE) 50 MCG/ACT nasal spray, Place 2 sprays into both nostrils daily as needed for allergies. , Disp: , Rfl:    gabapentin (NEURONTIN) 300 MG capsule, One capsule tid and two capsules q hs., Disp: 150 capsule, Rfl: 2   hydroxychloroquine (PLAQUENIL) 200 MG tablet, Take 200 mg by mouth every other day., Disp: , Rfl:    ipratropium (ATROVENT) 0.03 % nasal spray, SMARTSIG:2 Spray(s) Both Nares 3 Times Daily PRN, Disp: , Rfl:    latanoprost (XALATAN) 0.005 % ophthalmic solution, Place 1 drop into both eyes at bedtime. , Disp: , Rfl:    levothyroxine (SYNTHROID) 112 MCG tablet, TAKE 1 TABLET(112 MCG) BY MOUTH DAILY BEFORE BREAKFAST, Disp: 90 tablet, Rfl: 2   metoprolol tartrate (LOPRESSOR) 100 MG tablet, Take 1 tablet (100 mg total) by mouth 2 (two) times daily., Disp: 60 tablet, Rfl: 3   Multiple Vitamin (MULTI-VITAMIN) tablet, Take 1 tablet by mouth at bedtime. , Disp: , Rfl:    Probiotic Product (ALIGN) 4 MG CAPS, Take one capsule q day, Disp: 30 capsule, Rfl: 0   torsemide (DEMADEX) 20 MG tablet, Take 1 tablet (20 mg total) by mouth every other day., Disp: 45 tablet, Rfl: 3   traMADol (ULTRAM) 50 MG tablet, Take 1 tablet (50 mg total) by mouth 2 (two) times daily as needed (for pain.)., Disp: 60 tablet, Rfl: 1   ursodiol (ACTIGALL) 300 MG capsule, Take 300-600 mg by mouth See admin  instructions. Take 2 capsules (600 mg) daily in the morning   & 1 capsule (300 mg) at night., Disp: , Rfl:    XARELTO 15 MG TABS tablet, TAKE 1 TABLET(15 MG) BY MOUTH DAILY WITH SUPPER, Disp: 90 tablet, Rfl: 1  EXAM:  GENERAL: alert. Sounds to be in no acute distress.  Answering questions appropriately.    PSYCH/NEURO: pleasant and cooperative, no obvious depression or anxiety, speech and thought processing grossly intact  ASSESSMENT AND PLAN:  Discussed the following assessment and plan:  Problem List Items Addressed This Visit     Benign hypertension    On metoprolol and losartan.  Follow pressures.        Persistent atrial fibrillation (HCC)    Recently found to  be in SR.  Medication being adjusted.  Follow.        UTI (urinary tract infection)    Symptoms and urine dip - appear to be c/w UTI.  Treat with omnicef.  Await urine culture results.  Probiotics as directed.  Follow.        Relevant Medications   cefdinir (OMNICEF) 300 MG capsule    Return if symptoms worsen or fail to improve.   I discussed the assessment and treatment plan with the patient. The patient was provided an opportunity to ask questions and all were answered. The patient agreed with the plan and demonstrated an understanding of the instructions.   The patient was advised to call back or seek an in-person evaluation if the symptoms worsen or if the condition fails to improve as anticipated.  I provided 23 minutes of non-face-to-face time during this encounter.   Einar Pheasant, MD

## 2021-08-10 NOTE — Telephone Encounter (Signed)
Received a message from [facility]. Called back and was notified that patient had one episode of atrial fibrillation with RVR (VR 100-126bpm) and lasted for 90s. Called the patient without response.

## 2021-08-10 NOTE — Telephone Encounter (Signed)
Given concerns, see if agreeable for appt today to discuss.

## 2021-08-11 DIAGNOSIS — I4811 Longstanding persistent atrial fibrillation: Secondary | ICD-10-CM | POA: Diagnosis not present

## 2021-08-11 DIAGNOSIS — I10 Essential (primary) hypertension: Secondary | ICD-10-CM | POA: Diagnosis not present

## 2021-08-11 DIAGNOSIS — I5032 Chronic diastolic (congestive) heart failure: Secondary | ICD-10-CM | POA: Diagnosis not present

## 2021-08-11 DIAGNOSIS — I340 Nonrheumatic mitral (valve) insufficiency: Secondary | ICD-10-CM | POA: Diagnosis not present

## 2021-08-12 ENCOUNTER — Encounter: Payer: Self-pay | Admitting: Internal Medicine

## 2021-08-12 DIAGNOSIS — N390 Urinary tract infection, site not specified: Secondary | ICD-10-CM | POA: Insufficient documentation

## 2021-08-12 NOTE — Assessment & Plan Note (Signed)
On metoprolol and losartan.  Follow pressures.

## 2021-08-12 NOTE — Assessment & Plan Note (Signed)
Recently found to be in [place].  Medication being adjusted.  Follow.

## 2021-08-12 NOTE — Assessment & Plan Note (Signed)
Symptoms and urine dip - appear to be c/w UTI.  Treat with omnicef.  Await urine culture results.  Probiotics as directed.  Follow.

## 2021-08-14 ENCOUNTER — Ambulatory Visit (INDEPENDENT_AMBULATORY_CARE_PROVIDER_SITE_OTHER): Payer: Medicare Other | Admitting: Internal Medicine

## 2021-08-14 ENCOUNTER — Encounter: Payer: Self-pay | Admitting: Internal Medicine

## 2021-08-14 VITALS — BP 122/74 | HR 107 | Temp 97.9°F | Resp 19 | Ht 62.0 in | Wt 134.0 lb

## 2021-08-14 DIAGNOSIS — I5032 Chronic diastolic (congestive) heart failure: Secondary | ICD-10-CM

## 2021-08-14 DIAGNOSIS — E7800 Pure hypercholesterolemia, unspecified: Secondary | ICD-10-CM

## 2021-08-14 DIAGNOSIS — K743 Primary biliary cirrhosis: Secondary | ICD-10-CM

## 2021-08-14 DIAGNOSIS — E039 Hypothyroidism, unspecified: Secondary | ICD-10-CM | POA: Diagnosis not present

## 2021-08-14 DIAGNOSIS — I4819 Other persistent atrial fibrillation: Secondary | ICD-10-CM

## 2021-08-14 DIAGNOSIS — I272 Pulmonary hypertension, unspecified: Secondary | ICD-10-CM

## 2021-08-14 DIAGNOSIS — N1832 Chronic kidney disease, stage 3b: Secondary | ICD-10-CM | POA: Diagnosis not present

## 2021-08-14 DIAGNOSIS — I10 Essential (primary) hypertension: Secondary | ICD-10-CM

## 2021-08-14 DIAGNOSIS — I2510 Atherosclerotic heart disease of native coronary artery without angina pectoris: Secondary | ICD-10-CM

## 2021-08-14 DIAGNOSIS — K219 Gastro-esophageal reflux disease without esophagitis: Secondary | ICD-10-CM

## 2021-08-14 DIAGNOSIS — B029 Zoster without complications: Secondary | ICD-10-CM

## 2021-08-14 DIAGNOSIS — K754 Autoimmune hepatitis: Secondary | ICD-10-CM

## 2021-08-14 DIAGNOSIS — I25118 Atherosclerotic heart disease of native coronary artery with other forms of angina pectoris: Secondary | ICD-10-CM

## 2021-08-14 DIAGNOSIS — D500 Iron deficiency anemia secondary to blood loss (chronic): Secondary | ICD-10-CM | POA: Diagnosis not present

## 2021-08-14 NOTE — Progress Notes (Unsigned)
[name], female   DOB: 07/03/1928, 88 y.o.   MRN: [id number]   Subjective:    [name], female    DOB: 1929/01/22, 88 y.o.   MRN: [id number]   Patient here for a scheduled follow up.   Chief Complaint  Patient presents with   Follow-up   Urinary Tract Infection   Hypertension   .   HPI On recent visit, she had reported increased sob.  Was found to be bradycardic (44 bpm) and in SR.  Diltiazem was discontinued.  Metoprolol dose was decreased. Saw cardiology in follow.  Zio monitor placed.  (Currently wearing).  Since that visit, she has noticed some episodes of increased heart rate.  Per review, apparently Irhythm - episode afib -(08/10/21).  She continues on metoprolol '100mg'$  bid and xarelto.  She is currently off torsemide and is weighing herself daily.  Weight is stable.  No chest pain.  Breathing is better.  Not as sob.  Eating.  No nausea or vomiting.  Was recently evaluated with concerns regarding a possible UTI.  Only able to provide enough urine to do a urine dip.  Given symptoms and urine dip findings, elected to treat.  She is feeling better.  Has noticed some loose stool.  No increased diarrhea.  No abdominal pain.  Still having increased pain - posterior right thigh - shingles.  Taking tramadol and gabapentin.     Past Medical History:  Diagnosis Date   (HFpEF) heart failure with preserved ejection fraction ([place])    a. 07/2017 Echo: EF 50-55%, no rwma, mild AS/AI, mod MR, mod dil LA, mod TR, PASP 19mHg; b. 03/2021 Echo: EF 60-65%. No rwma. Nl RV fxn. Sev BAE. Mild MR. Mild AI/AS.   Autoimmune hepatitis ([place]    followed by Dr SGustavo Lah  Fibrocystic breast disease    Glaucoma    Hypercholesterolemia    Hypertension    Hypothyroidism    Inflammatory arthritis    MI (myocardial infarction) ([place]    Mitral regurgitation    a. 07/2017 Echo: Mod MR; b. 08/2017 Cath: 4+/Severe MR; c. 08/2017 TEE: Mild MR; d. 03/2021 Echo: Mild MR.   Neuropathy     Non-obstructive Coronary artery disease    a. 05/2014 NSTEMI/Cath: LM nl, LAD mild dzs, LCX 60ost hazy (? culprit), RCA mild dzs. EF 60% by echo-->Med Rx; b. 08/2017 Cath: LM 30ost, LAD min irregs, LCX small, 40ost/p, RCA large, min irregs, RPDA/RPL1 nl, EF 50-55%. 4+MR.   Osteoarthritis    Permanent atrial fibrillation (HCC)    a. CHA2DS2VASc = 6-->Xarelto.   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   Past Surgical History:  Procedure Laterality Date   ABDOMINAL HYSTERECTOMY  1963   partial, secondary to fibroids   APPENDECTOMY     Billroth     CARDIAC CATHETERIZATION  05/12/14   [facility]   CARDIOVERSION N/A 12/26/2016   Procedure: CARDIOVERSION;  Surgeon: AWellington Hampshire MD;  Location: [facility] ORS;  Service: Cardiovascular;  Laterality: N/A;   CARDIOVERSION N/A 05/16/2017   Procedure: CARDIOVERSION;  Surgeon: AWellington Hampshire MD;  Location: [facility] ORS;  Service: Cardiovascular;  Laterality: N/A;   CARDIOVERSION N/A 06/09/2017   Procedure: CARDIOVERSION;  Surgeon: AWellington Hampshire MD;  Location: [facility] ORS;  Service: Cardiovascular;  Laterality: N/A;   CARDIOVERSION N/A 11/30/2018   Procedure: CARDIOVERSION;  Surgeon: AWellington Hampshire MD;  Location: [facility] ORS;  Service: Cardiovascular;  Laterality: N/A;   CHOLECYSTECTOMY  1998   COLONOSCOPY  2009   RIGHT/LEFT HEART CATH AND CORONARY ANGIOGRAPHY Bilateral 08/21/2017   Procedure: RIGHT/LEFT HEART CATH AND CORONARY ANGIOGRAPHY;  Surgeon: [name], MD;  Location: [facility] [facility];  Service: Cardiovascular;  Laterality: Bilateral;   TEE WITHOUT CARDIOVERSION N/A 08/25/2017   Procedure: TRANSESOPHAGEAL ECHOCARDIOGRAM (TEE);  Surgeon: Jolaine Artist, MD;  Location: [facility] ENDOSCOPY;  Service: Cardiovascular;  Laterality: N/A;   UPPER GI ENDOSCOPY  2004   Family History  Problem Relation Age of Onset   Stroke Mother    Esophageal cancer Brother        also had lung cancer   Ovarian  cancer Daughter    Colon cancer Daughter    Breast cancer Neg Hx    Social History   Socioeconomic History   Marital status: Widowed    Spouse name: Not on file   Number of children: 2   Years of education: Not on file   Highest education level: Not on file  Occupational History   Occupation: retired  Tobacco Use   Smoking status: Never   Smokeless tobacco: Never  Vaping Use   Vaping Use: Never used  Substance and Sexual Activity   Alcohol use: No    Alcohol/week: 0.0 standard drinks   Drug use: No   Sexual activity: Never  Other Topics Concern   Not on file  Social History Narrative   Not on file   Social Determinants of Health   Financial Resource Strain: Low Risk    Difficulty of Paying Living Expenses: Not hard at all  Food Insecurity: No Food Insecurity   Worried About Charity fundraiser in the Last Year: Never true   [place] in the Last Year: Never true  Transportation Needs: No Transportation Needs   Lack of Transportation (Medical): No   Lack of Transportation (Non-Medical): No  Physical Activity: Insufficiently Active   Days of Exercise per Week: 3 days   Minutes of Exercise per Session: 40 min  Stress: No Stress Concern Present   Feeling of Stress : Not at all  Social Connections: Unknown   Frequency of Communication with Friends and Family: More than three times a week   Frequency of Social Gatherings with Friends and Family: More than three times a week   Attends Religious Services: Not on Electrical engineer or Organizations: Not on file   Attends Archivist Meetings: Not on file   Marital Status: Not on file     Review of Systems  Constitutional:  Negative for appetite change and unexpected weight change.  HENT:  Positive for congestion. Negative for sinus pressure.   Respiratory:  Negative for cough and chest tightness.        Breathing stable.    Cardiovascular:  Negative for chest pain, palpitations and leg  swelling.  Gastrointestinal:  Negative for abdominal pain, nausea and vomiting.       Some loose stool as outlined.   Genitourinary:  Negative for difficulty urinating and dysuria.  Musculoskeletal:  Negative for joint swelling and myalgias.  Skin:  Negative for color change and rash.  Neurological:  Negative for dizziness, light-headedness and headaches.  Psychiatric/Behavioral:  Negative for agitation and dysphoric mood.       Objective:     BP 122/74 (BP Location: Left Arm, Patient Position: Sitting, Cuff Size: Small)   Pulse (!) 107   Temp 97.9 F (  36.6 C) (Temporal)   Resp 19   Ht '5\' 2"'$  (1.575 m)   Wt 134 lb (60.8 kg)   SpO2 98%   BMI 24.51 kg/m  Wt Readings from Last 3 Encounters:  08/14/21 134 lb (60.8 kg)  08/10/21 139 lb (63 kg)  07/31/21 139 lb 2 oz (63.1 kg)    Physical Exam Vitals reviewed.  Constitutional:      General: She is not in acute distress.    Appearance: Normal appearance.  HENT:     Head: Normocephalic and atraumatic.     Right Ear: External ear normal.     Left Ear: External ear normal.  Eyes:     General: No scleral icterus.       Right eye: No discharge.        Left eye: No discharge.     Conjunctiva/sclera: Conjunctivae normal.  Neck:     Thyroid: No thyromegaly.  Cardiovascular:     Comments: Appears irregular - ventricular rate 88-100.  Pulmonary:     Effort: No respiratory distress.     Breath sounds: Normal breath sounds. No wheezing.  Abdominal:     General: Bowel sounds are normal.     Palpations: Abdomen is soft.     Tenderness: There is no abdominal tenderness.  Musculoskeletal:        General: No swelling or tenderness.     Cervical back: Neck supple. No tenderness.  Lymphadenopathy:     Cervical: No cervical adenopathy.  Skin:    Findings: No erythema or rash.  Neurological:     Mental Status: She is alert.  Psychiatric:        Mood and Affect: Mood normal.        Behavior: Behavior normal.     Outpatient  Encounter Medications as of 08/14/2021  Medication Sig   allopurinol (ZYLOPRIM) 100 MG tablet TAKE 1/2 TABLET(50 MG) BY MOUTH DAILY   cefdinir (OMNICEF) 300 MG capsule Take 1 capsule (300 mg total) by mouth 2 (two) times daily.   colchicine 0.6 MG tablet Take 0.6 mg by mouth daily as needed.   CYANOCOBALAMIN PO Take 500 mcg by mouth once a week.   dorzolamide (TRUSOPT) 2 % ophthalmic solution Place 1 drop into the left eye 2 (two) times daily.   fluticasone (FLONASE) 50 MCG/ACT nasal spray Place 2 sprays into both nostrils daily as needed for allergies.    gabapentin (NEURONTIN) 300 MG capsule One capsule tid and two capsules q hs.   hydroxychloroquine (PLAQUENIL) 200 MG tablet Take 200 mg by mouth every other day.   ipratropium (ATROVENT) 0.03 % nasal spray SMARTSIG:2 Spray(s) Both Nares 3 Times Daily PRN   latanoprost (XALATAN) 0.005 % ophthalmic solution Place 1 drop into both eyes at bedtime.    levothyroxine (SYNTHROID) 112 MCG tablet TAKE 1 TABLET(112 MCG) BY MOUTH DAILY BEFORE BREAKFAST   metoprolol tartrate (LOPRESSOR) 100 MG tablet Take 1 tablet (100 mg total) by mouth 2 (two) times daily.   Multiple Vitamin (MULTI-VITAMIN) tablet Take 1 tablet by mouth at bedtime.    Probiotic Product (ALIGN) 4 MG CAPS Take one capsule q day   torsemide (DEMADEX) 20 MG tablet Take 1 tablet (20 mg total) by mouth every other day.   traMADol (ULTRAM) 50 MG tablet Take 1 tablet (50 mg total) by mouth 2 (two) times daily as needed (for pain.).   ursodiol (ACTIGALL) 300 MG capsule Take 300-600 mg by mouth See admin instructions. Take 2 capsules (600 mg)  daily in the morning   & 1 capsule (300 mg) at night.   XARELTO 15 MG TABS tablet TAKE 1 TABLET(15 MG) BY MOUTH DAILY WITH SUPPER   No facility-administered encounter medications on file as of 08/14/2021.     Lab Results  Component Value Date   WBC 6.5 07/30/2021   HGB 12.0 07/30/2021   HCT 38.1 07/30/2021   PLT 192.0 07/30/2021   GLUCOSE 77 08/10/2021    CHOL 162 06/05/2021   TRIG 153.0 (H) 06/05/2021   HDL 59.50 06/05/2021   LDLDIRECT 143.5 12/17/2012   LDLCALC 72 06/05/2021   ALT 19 06/05/2021   AST 16 06/05/2021   NA 137 08/10/2021   K 5.0 08/10/2021   CL 106 08/10/2021   CREATININE 1.15 08/10/2021   BUN 25 (H) 08/10/2021   CO2 23 08/10/2021   TSH 4.15 07/30/2021   INR 1.63 12/29/2017   HGBA1C 5.6 01/27/2018       Assessment & Plan:   Problem List Items Addressed This Visit     Atherosclerotic heart disease of native coronary artery with other forms of angina pectoris ([place])    Continue risk factor modification.  Continue metoprolol and xarelto.        Autoimmune hepatitis ([place])    Follow liver function tests.  Followed by GI.        Benign hypertension    On metoprolol and losartan.  Follow pressures.        Chronic diastolic heart failure (HCC)    Currently off torsemide.  Weight stable.  She is weighing daily.  No increased sob.  Continue to weigh daily.  Parameters given - for torsemide dosing.          CKD (chronic kidney disease) stage 3, GFR 30-59 ml/min (HCC)    Off torsemide currently.  Weighing daily.  Last GFR improved.  Avoid antiinflammatories.  Follow metabolic panel.        GERD (gastroesophageal reflux disease)    Upper symptoms controlled.  On protonix.        Hypercholesteremia    Unable to take statin medication.  Follow lipid panel.        Hypothyroidism    On thyroid replacement.  Follow tsh. Recent check wnl.        Iron deficiency anemia    Seeing Dr Tasia Catchings.  Last hgb wnl.  Follow cbc and iron studies.        Persistent atrial fibrillation (HCC) - Primary    Recent - SR/SB.  Medication adjusted.  Appears to be back in afib now (question in and out).  EKG today as outlined. Wearing zio monitor.  Per 08/10/21 - phone note - episode afib.  Continue xarelto.  Continue metoprolol. Discussed the possibility of adding back diltiazem or using prn.  Sees cardiology tomorrow.  Will hold  on adjusting medication today.  Have discussed the possibility of need for EP evaluation.         Relevant Orders   EKG 12-Lead (Completed)   Primary biliary cholangitis ([place])    Followed by GI. Stable. Follow liver function tests.         Pulmonary hypertension, unspecified ([place])    Followed by cardiology.        Shingles    No open lesions.  Taking tramadol and gabapentin and tylenol.  Still with increased pain.  Follow          Einar Pheasant, MD

## 2021-08-15 ENCOUNTER — Ambulatory Visit (INDEPENDENT_AMBULATORY_CARE_PROVIDER_SITE_OTHER): Payer: Medicare Other | Admitting: Nurse Practitioner

## 2021-08-15 ENCOUNTER — Encounter: Payer: Self-pay | Admitting: Nurse Practitioner

## 2021-08-15 ENCOUNTER — Encounter: Payer: Self-pay | Admitting: Internal Medicine

## 2021-08-15 VITALS — BP 152/102 | HR 126 | Ht 62.0 in | Wt 133.8 lb

## 2021-08-15 DIAGNOSIS — I2510 Atherosclerotic heart disease of native coronary artery without angina pectoris: Secondary | ICD-10-CM | POA: Diagnosis not present

## 2021-08-15 DIAGNOSIS — I5032 Chronic diastolic (congestive) heart failure: Secondary | ICD-10-CM

## 2021-08-15 DIAGNOSIS — I10 Essential (primary) hypertension: Secondary | ICD-10-CM

## 2021-08-15 DIAGNOSIS — I340 Nonrheumatic mitral (valve) insufficiency: Secondary | ICD-10-CM

## 2021-08-15 DIAGNOSIS — N1832 Chronic kidney disease, stage 3b: Secondary | ICD-10-CM | POA: Diagnosis not present

## 2021-08-15 DIAGNOSIS — E039 Hypothyroidism, unspecified: Secondary | ICD-10-CM

## 2021-08-15 DIAGNOSIS — I4811 Longstanding persistent atrial fibrillation: Secondary | ICD-10-CM

## 2021-08-15 MED ORDER — DILTIAZEM HCL ER COATED BEADS 120 MG PO CP24: 120.0000 mg | ORAL_CAPSULE | Freq: Every day | ORAL | 3 refills | Status: DC

## 2021-08-15 MED ORDER — DILTIAZEM HCL ER COATED BEADS 180 MG PO CP24: 180.0000 mg | ORAL_CAPSULE | Freq: Every day | ORAL | 3 refills | Status: DC

## 2021-08-15 NOTE — Assessment & Plan Note (Signed)
On metoprolol and losartan.  Follow pressures.

## 2021-08-15 NOTE — Assessment & Plan Note (Signed)
On thyroid replacement.  Follow tsh. Recent check wnl.

## 2021-08-15 NOTE — Assessment & Plan Note (Signed)
No open lesions.  Taking tramadol and gabapentin and tylenol.  Still with increased pain.  Follow

## 2021-08-15 NOTE — Assessment & Plan Note (Signed)
Unable to take statin medication.  Follow lipid panel.  

## 2021-08-15 NOTE — Assessment & Plan Note (Signed)
Followed by GI.  Stable.  Follow liver function tests.  

## 2021-08-15 NOTE — Assessment & Plan Note (Signed)
Currently off torsemide.  Weight stable.  She is weighing daily.  No increased sob.  Continue to weigh daily.  Parameters given - for torsemide dosing.

## 2021-08-15 NOTE — Assessment & Plan Note (Signed)
Recent - SR/SB.  Medication adjusted.  Appears to be back in afib now (question in and out).  EKG today as outlined. Wearing zio monitor.  Per 08/10/21 - phone note - episode afib.  Continue xarelto.  Continue metoprolol. Discussed the possibility of adding back diltiazem or using prn.  Sees cardiology tomorrow.  Will hold on adjusting medication today.  Have discussed the possibility of need for EP evaluation.

## 2021-08-15 NOTE — Assessment & Plan Note (Signed)
Followed by cardiology 

## 2021-08-15 NOTE — Progress Notes (Signed)
Office Visit    Patient Name: Ruth Roth Date of Encounter: 08/15/2021  Primary Care Provider:  Einar Pheasant, MD Primary Cardiologist:  [name], MD  Chief Complaint    88 year old female with history of nonobstructive CAD, chronic HFpEF, mitral regurgitation, atrial fibrillation, hypertension, hyperlipidemia, stage III chronic kidney disease, hypothyroidism, peptic ulcer disease, dumping syndrome, arthritis, neuropathy, fibrocystic breast disease, and autoimmune hepatitis, who presents for follow-up related to atrial fibrillation with recent bradycardia in the setting of sinus rhythm.  Past Medical History    Past Medical History:  Diagnosis Date   (HFpEF) heart failure with preserved ejection fraction ([facility])    a. 07/2017 Echo: EF 50-55%, no rwma, mild AS/AI, mod MR, mod dil LA, mod TR, PASP 50mHg; b. 03/2021 Echo: EF 60-65%. No rwma. Nl RV fxn. Sev BAE. Mild MR. Mild AI/AS.   Autoimmune hepatitis ([facility]    followed by Dr SGustavo Lah  Fibrocystic breast disease    Glaucoma    Hypercholesterolemia    Hypertension    Hypothyroidism    Inflammatory arthritis    MI ([place]ocardial infarction) ([facility]    Mitral regurgitation    a. 07/2017 Echo: Mod MR; b. 08/2017 Cath: 4+/Severe MR; c. 08/2017 TEE: Mild MR; d. 03/2021 Echo: Mild MR.   Neuropathy    Non-obstructive Coronary artery disease    a. 05/2014 NSTEMI/Cath: LM nl, LAD mild dzs, LCX 60ost hazy (? culprit), RCA mild dzs. EF 60% by echo-->Med Rx; b. 08/2017 Cath: LM 30ost, LAD min irregs, LCX small, 40ost/p, RCA large, min irregs, RPDA/RPL1 nl, EF 50-55%. 4+MR.   Osteoarthritis    Permanent atrial fibrillation (HCC)    a. CHA2DS2VASc = 6-->Xarelto.   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   Past Surgical History:  Procedure Laterality Date   ABDOMINAL HYSTERECTOMY  1963   partial, secondary to fibroids   APPENDECTOMY     Billroth     CARDIAC CATHETERIZATION  05/12/14    [facility]   CARDIOVERSION N/A 12/26/2016   Procedure: CARDIOVERSION;  Surgeon: AWellington Hampshire MD;  Location: [facility] ORS;  Service: Cardiovascular;  Laterality: N/A;   CARDIOVERSION N/A 05/16/2017   Procedure: CARDIOVERSION;  Surgeon: AWellington Hampshire MD;  Location: [facility] ORS;  Service: Cardiovascular;  Laterality: N/A;   CARDIOVERSION N/A 06/09/2017   Procedure: CARDIOVERSION;  Surgeon: AWellington Hampshire MD;  Location: [facility] ORS;  Service: Cardiovascular;  Laterality: N/A;   CARDIOVERSION N/A 11/30/2018   Procedure: CARDIOVERSION;  Surgeon: AWellington Hampshire MD;  Location: [facility] ORS;  Service: Cardiovascular;  Laterality: N/A;   CHOLECYSTECTOMY  1998   COLONOSCOPY  2009   RIGHT/LEFT HEART CATH AND CORONARY ANGIOGRAPHY Bilateral 08/21/2017   Procedure: RIGHT/LEFT HEART CATH AND CORONARY ANGIOGRAPHY;  Surgeon: AWellington Hampshire MD;  [facility];  Service: Cardiovascular;  Laterality: Bilateral;   TEE WITHOUT CARDIOVERSION N/A 08/25/2017   Procedure: TRANSESOPHAGEAL ECHOCARDIOGRAM (TEE);  Surgeon: BJolaine Artist MD;  [facility];  Service: Cardiovascular;  Laterality: N/A;   UPPER GI ENDOSCOPY  2004    Allergies  Allergies  Allergen Reactions   Statins Other (See Comments)    Affect her liver, Caused liver problems  Caused liver problems Affect her liver Liver damage Other reaction(s): Other (See Comments) Liver damage Affect her liver, Caused liver problems   Hydroxychloroquine Hives   Haldol [Haloperidol Lactate] Rash   Librax [Chlordiazepoxide-Clidinium] Rash   Plavix [Clopidogrel Bisulfate] Rash  Ramipril Rash    History of Present Illness    88 year old female with the above past medical history including nonobstructive CAD, chronic HFpEF, mitral regurgitation, atrial fibrillation, hypertension, hyperlipidemia, stage III chronic kidney disease, hypothyroidism, peptic ulcer disease, dumping syndrome, arthritis, neuropathy, fibrocystic breast  disease, and autoimmune hepatitis.  In March 2016, she suffered a non-STEMI in the setting of acute diastolic heart failure following a GI illness.  Echo showed normal LV function.  Diagnostic cath showed a 60% hazy ostial left circumflex stenosis, which was possibly the culprit.  She otherwise had mild LAD and RCA disease, and was medically managed.  In 2019, she had worsening heart failure and underwent right and left heart diagnostic catheterization in June 2019, showing mild, nonobstructive CAD, and an EF of 50 to 55% with severe mitral regurgitation.  Right heart catheterization at [facility] showed severely elevated filling pressures with moderate to severe pulm hypertension, and severely reduced cardiac output.  She was transferred to [facility], and evaluated by our advanced heart failure team.  She was placed on milrinone and effectively diuresed.  Transesophageal echocardiogram showed normal LV function and only mild mitral regurgitation.  There was felt that MR was functional in the setting of volume overload.  She has had multiple subsequent echocardiograms with her most recent echo in January 2023, showing an EF of 60 to 65% without regional wall motion abnormalities, normal RV function, severe biatrial enlargement, mild mitral regurgitation, mild AI, and mild aortic stenosis.  [name] was recently seen by primary care in May 22 with complaints of dyspnea, and was noted to be bradycardic.  She was in sinus rhythm.  Between primary care visit and subsequent follow-up with [place] on May 23, adjustments were made to AV nodal blocking agents, and diltiazem was discontinued.  A Zio monitor was placed to assess for tachybradycardia syndrome.  She contacted our office on May 30 indicate that blood pressure and heart rates were elevated.  It was presumed that she was back in atrial fibrillation (heart rate 126), and metoprolol was increased to 100 mg twice daily.  Heart rate was 107 at primary care follow-up on June  6.  Preliminary review of available monitoring reports through the iRhythm application confirms atrial fibrillation with rates in the 70s to 120s dating back to at least June 1, when she first put monitor on.  She notes palpitations but denies chest pain or dyspnea.  She has been having some discomfort in her right lower leg in the setting of a strangles infection.  She also recently had a urinary tract infection, from which she is now recovered.  She denies PND, orthopnea, dizziness, syncope, edema, fatigue, or early satiety.  Home Medications    Current Outpatient Medications  Medication Sig Dispense Refill   allopurinol (ZYLOPRIM) 100 MG tablet TAKE 1/2 TABLET(50 MG) BY MOUTH DAILY 45 tablet 1   cefdinir (OMNICEF) 300 MG capsule Take 1 capsule (300 mg total) by mouth 2 (two) times daily. 10 capsule 0   colchicine 0.6 MG tablet Take 0.6 mg by mouth daily as needed.     CYANOCOBALAMIN PO Take 500 mcg by mouth once a week.     dorzolamide (TRUSOPT) 2 % ophthalmic solution Place 1 drop into the left eye 2 (two) times daily.     fluticasone (FLONASE) 50 MCG/ACT nasal spray Place 2 sprays into both nostrils daily as needed for allergies.      gabapentin (NEURONTIN) 300 MG capsule One capsule tid and two capsules  q hs. 150 capsule 2   hydroxychloroquine (PLAQUENIL) 200 MG tablet Take 200 mg by mouth every other day.     ipratropium (ATROVENT) 0.03 % nasal spray SMARTSIG:2 Spray(s) Both Nares 3 Times Daily PRN     latanoprost (XALATAN) 0.005 % ophthalmic solution Place 1 drop into both eyes at bedtime.      levothyroxine (SYNTHROID) 112 MCG tablet TAKE 1 TABLET(112 MCG) BY MOUTH DAILY BEFORE BREAKFAST 90 tablet 2   metoprolol tartrate (LOPRESSOR) 100 MG tablet Take 1 tablet (100 mg total) by mouth 2 (two) times daily. 60 tablet 3   Multiple Vitamin (MULTI-VITAMIN) tablet Take 1 tablet by mouth at bedtime.      Probiotic Product (ALIGN) 4 MG CAPS Take one capsule q day 30 capsule 0   torsemide  (DEMADEX) 20 MG tablet Take 1 tablet (20 mg total) by mouth every other day. 45 tablet 3   traMADol (ULTRAM) 50 MG tablet Take 1 tablet (50 mg total) by mouth 2 (two) times daily as needed (for pain.). 60 tablet 1   ursodiol (ACTIGALL) 300 MG capsule Take 300-600 mg by mouth See admin instructions. Take 2 capsules (600 mg) daily in the morning   & 1 capsule (300 mg) at night.     XARELTO 15 MG TABS tablet TAKE 1 TABLET(15 MG) BY MOUTH DAILY WITH SUPPER 90 tablet 1   No current facility-administered medications for this visit.     Review of Systems    Notes palpitations.  Dealing with shingles of the right upper leg.  Denies chest pain, dyspnea, PND, orthopnea, dizziness, syncope, edema, or early satiety.  All other systems reviewed and are otherwise negative except as noted above.    Physical Exam    VS:  BP (!) 152/102 (BP Location: Left Arm, Patient Position: Sitting, Cuff Size: Normal)   Pulse (!) 126   Ht '5\' 2"'$  (1.575 m)   Wt 133 lb 12.8 oz (60.7 kg)   SpO2 98%   BMI 24.47 kg/m  , BMI Body mass index is 24.47 kg/m.     GEN: Well nourished, well developed, in no acute distress. HEENT: normal. Neck: Supple, no JVD, carotid bruits, or masses. Cardiac: Irregularly irregular, tachycardic, 2/6 systolic murmur heard throughout, no rubs, or gallops. No clubbing, cyanosis, edema.  Radials/PT 1+ and equal bilaterally.  Respiratory:  Respirations regular and unlabored, clear to auscultation bilaterally. GI: Soft, nontender, nondistended, BS + x 4. MS: no deformity or atrophy. Skin: warm and dry, no rash. Neuro:  Strength and sensation are intact. Psych: Normal affect.  Accessory Clinical Findings    ECG personally reviewed by me today -supraventricular tachycardia, likely atrial flutter at 126 bpm- no acute changes.  Lab Results  Component Value Date   WBC 6.5 07/30/2021   HGB 12.0 07/30/2021   HCT 38.1 07/30/2021   MCV 106.9 (H) 07/30/2021   PLT 192.0 07/30/2021   Lab Results   Component Value Date   CREATININE 1.15 08/10/2021   BUN 25 (H) 08/10/2021   NA 137 08/10/2021   K 5.0 08/10/2021   CL 106 08/10/2021   CO2 23 08/10/2021   Lab Results  Component Value Date   ALT 19 06/05/2021   AST 16 06/05/2021   ALKPHOS 115 06/05/2021   BILITOT 0.4 06/05/2021   Lab Results  Component Value Date   CHOL 162 06/05/2021   HDL 59.50 06/05/2021   LDLCALC 72 06/05/2021   LDLDIRECT 143.5 12/17/2012   TRIG 153.0 (H) 06/05/2021  CHOLHDL 3 06/05/2021    Lab Results  Component Value Date   HGBA1C 5.6 01/27/2018    Assessment & Plan    1.  Longstanding persistent atrial fibrillation: Patient with a history of atrial fibrillation dating back to 2019 with historically good rate control on beta-blocker and calcium channel blocker therapy.  Seen May 23 secondary to bradycardia in the setting of conversion to sinus rhythm.  Diltiazem was discontinued at that time and metoprolol was adjusted back to 50 mg twice daily.  A Zio monitor was placed.  Within a week, she had recurrent tachycardia and monitoring has shown persistent atrial fibrillation with rates trending in the 70s to 120s.  Metoprolol has since been increased back to 100 mg twice daily.  She notes palpitations but otherwise is not particularly symptomatic, despite tachycardia.  Heart rate today is 126 and her rhythm is more regular than what was seen on monitoring even this morning, suggesting SVT versus atrial flutter.  She is currently asymptomatic.  I will add back diltiazem CD 120 mg daily, as this was effective in controlling her rate previously and she will wear a ZIO monitor for 1 more week, allowing [place] to assess for rate control and recurrent bradycardia.  She understands that if she proves to have tachybradycardia syndrome, she will require EP evaluation and likely permanent pacemaker.  She remains anticoagulated on rivaroxaban.  2.  Chronic HFpEF: Echo earlier this year with normal LV function and mild  MR/AI/AS.  She is euvolemic on examination and with the exception of palpitations, feeling well.  Adding back diltiazem for improved heart rate control as outlined above.  3.  Essential hypertension: Blood pressure elevated this morning however, she has yet to take her morning dose of metoprolol.  She will continue to follow blood pressure at home with the addition of diltiazem, which she typically takes at night.  4.  Coronary artery disease: Nonobstructive CAD by catheterization 2019.  She denies chest pain.  Normal LV function earlier this year.  Improvement in fatigue and dyspnea with reversion back to atrial fibrillation.  5.  Hypothyroidism: On Synthroid.  TSH normal at 4.15 in May.  6.  Stage III chronic kidney disease: Creatinine was above baseline at 1.83 at evaluation in May.  Currently only using torsemide as needed.  7.  Mitral regurgitation: Initially felt to be severe back in 2019, but following diuresis, noted to be mild.  MR was mild on most recent echo earlier this year, and is felt to be largely functional in nature.  8.  Disposition: Complete Zio monitoring course.  Follow-up in 2 weeks or sooner if necessary.   Murray Hodgkins, NP 08/15/2021, 8:58 AM

## 2021-08-15 NOTE — Addendum Note (Signed)
Addended by: Valora Corporal on: 08/15/2021 12:16 PM   Modules accepted: Orders

## 2021-08-15 NOTE — Patient Instructions (Addendum)
Medication Instructions:  Your physician has recommended you make the following change in your medication:   RESTART Diltiazem 120 mg once daily   *If you need a refill on your cardiac medications before your next appointment, please call your pharmacy*   Lab Work: None  If you have labs (blood work) drawn today and your tests are completely normal, you will receive your results only by: [place] (if you have MyChart) OR A paper copy in the mail If you have any lab test that is abnormal or we need to change your treatment, we will call you to review the results.   Testing/Procedures: None   Follow-Up: At [facility], you and your health needs are our priority.  As part of our continuing mission to provide you with exceptional heart care, we have created designated Provider Care Teams.  These Care Teams include your primary Cardiologist (physician) and Advanced Practice Providers (APPs -  Physician Assistants and Nurse Practitioners) who all work together to provide you with the care you need, when you need it.  We recommend signing up for the patient portal called "MyChart".  Sign up information is provided on this After Visit Summary.  MyChart is used to connect with patients for Virtual Visits (Telemedicine).  Patients are able to view lab/test results, encounter notes, upcoming appointments, etc.  Non-urgent messages can be sent to your provider as well.   To learn more about what you can do with MyChart, go to [email or web address].    Your next appointment:   2 week(s)  The format for your next appointment:   In Person  Provider:   [name], MD or Murray Hodgkins, NP       Important Information About Sugar

## 2021-08-15 NOTE — Assessment & Plan Note (Signed)
Follow liver function tests.  Followed by GI.  

## 2021-08-15 NOTE — Assessment & Plan Note (Signed)
Seeing Dr Yu.  Last hgb wnl.  Follow cbc and iron studies.  

## 2021-08-15 NOTE — Assessment & Plan Note (Signed)
Off torsemide currently.  Weighing daily.  Last GFR improved.  Avoid antiinflammatories.  Follow metabolic panel.

## 2021-08-15 NOTE — Assessment & Plan Note (Signed)
Upper symptoms controlled.  On protonix.  

## 2021-08-15 NOTE — Assessment & Plan Note (Addendum)
Continue risk factor modification.  Continue metoprolol and xarelto.  

## 2021-08-17 ENCOUNTER — Telehealth: Payer: Self-pay | Admitting: Cardiovascular Disease

## 2021-08-17 DIAGNOSIS — I495 Sick sinus syndrome: Secondary | ICD-10-CM

## 2021-08-17 NOTE — Telephone Encounter (Signed)
Calling with abnormal results for pt.

## 2021-08-17 NOTE — Telephone Encounter (Signed)
Please refer to EP for evaluation of tachycardia-bradycardia syndrome and possible need for pacemaker.

## 2021-08-17 NOTE — Telephone Encounter (Signed)
Slow a-fib 37 bpm for 60 secs. Today 2:50pm. Called pt, she states she felt a "short on her breath" earlier time but it wasn't a big deal.  Informed DOD, Dr. Audie Box. Orders to have pt follow up with Dr. Fletcher Anon.

## 2021-08-17 NOTE — Telephone Encounter (Signed)
Call was disconnected while trying to get Triage on the line. Please advise

## 2021-08-17 NOTE — Telephone Encounter (Signed)
[facility] to call NL triage the covering nurses for this afternoon but will call me back if she cannot reach anyone at that office.

## 2021-08-17 NOTE — Telephone Encounter (Signed)
Ruth Roth from [facility] is calling for additional results on this pt

## 2021-08-17 NOTE — Telephone Encounter (Signed)
This has been addressed in another call from today. Closing this encounter.

## 2021-08-20 NOTE — Telephone Encounter (Signed)
Spoke with patient and reviewed provider recommendations for referral to EP specialist. Advised order was placed and someone from scheduling would be calling to assist with appointment. She verbalized understanding with no further questions at this time.

## 2021-08-20 NOTE — Addendum Note (Signed)
Addended by: Valora Corporal on: 08/20/2021 09:18 AM   Modules accepted: Orders

## 2021-08-22 ENCOUNTER — Telehealth: Payer: Self-pay | Admitting: Nurse Practitioner

## 2021-08-22 NOTE — Telephone Encounter (Signed)
LVM to schedule EP consult

## 2021-08-22 NOTE — Telephone Encounter (Signed)
Patient received call from office but was not sure who called and was just returning call to follow-up.

## 2021-08-22 NOTE — Telephone Encounter (Signed)
Spoke with patient and reviewed recommendations for referral. Scheduled for August with Dr. Quentin Ore and spoke with his nurse regarding referral and to inquire if sooner appointment possible. Discussed briefly reason for this referral and she has appointment next week to see Murray Hodgkins NP. She was agreeable with plan with no further questions at this time. Will route to Dr. Mardene Speak nurse for review.

## 2021-08-23 ENCOUNTER — Ambulatory Visit (INDEPENDENT_AMBULATORY_CARE_PROVIDER_SITE_OTHER): Payer: Medicare Other | Admitting: Internal Medicine

## 2021-08-23 ENCOUNTER — Encounter: Payer: Self-pay | Admitting: Internal Medicine

## 2021-08-23 ENCOUNTER — Ambulatory Visit (INDEPENDENT_AMBULATORY_CARE_PROVIDER_SITE_OTHER): Payer: Medicare Other

## 2021-08-23 VITALS — BP 150/92 | HR 118 | Temp 98.5°F | Ht 62.0 in | Wt 136.0 lb

## 2021-08-23 DIAGNOSIS — R0781 Pleurodynia: Secondary | ICD-10-CM

## 2021-08-23 DIAGNOSIS — K754 Autoimmune hepatitis: Secondary | ICD-10-CM

## 2021-08-23 DIAGNOSIS — D509 Iron deficiency anemia, unspecified: Secondary | ICD-10-CM

## 2021-08-23 DIAGNOSIS — D500 Iron deficiency anemia secondary to blood loss (chronic): Secondary | ICD-10-CM

## 2021-08-23 DIAGNOSIS — N644 Mastodynia: Secondary | ICD-10-CM | POA: Diagnosis not present

## 2021-08-23 DIAGNOSIS — B029 Zoster without complications: Secondary | ICD-10-CM

## 2021-08-23 DIAGNOSIS — M810 Age-related osteoporosis without current pathological fracture: Secondary | ICD-10-CM | POA: Diagnosis not present

## 2021-08-23 DIAGNOSIS — I700 Atherosclerosis of aorta: Secondary | ICD-10-CM | POA: Diagnosis not present

## 2021-08-23 DIAGNOSIS — I4819 Other persistent atrial fibrillation: Secondary | ICD-10-CM

## 2021-08-23 DIAGNOSIS — I2510 Atherosclerotic heart disease of native coronary artery without angina pectoris: Secondary | ICD-10-CM

## 2021-08-23 DIAGNOSIS — N1832 Chronic kidney disease, stage 3b: Secondary | ICD-10-CM

## 2021-08-23 DIAGNOSIS — I5032 Chronic diastolic (congestive) heart failure: Secondary | ICD-10-CM | POA: Diagnosis not present

## 2021-08-23 DIAGNOSIS — S2241XA Multiple fractures of ribs, right side, initial encounter for closed fracture: Secondary | ICD-10-CM | POA: Diagnosis not present

## 2021-08-23 DIAGNOSIS — I10 Essential (primary) hypertension: Secondary | ICD-10-CM

## 2021-08-23 DIAGNOSIS — I272 Pulmonary hypertension, unspecified: Secondary | ICD-10-CM

## 2021-08-23 DIAGNOSIS — I25118 Atherosclerotic heart disease of native coronary artery with other forms of angina pectoris: Secondary | ICD-10-CM

## 2021-08-23 DIAGNOSIS — K219 Gastro-esophageal reflux disease without esophagitis: Secondary | ICD-10-CM

## 2021-08-23 DIAGNOSIS — K743 Primary biliary cirrhosis: Secondary | ICD-10-CM | POA: Diagnosis not present

## 2021-08-23 DIAGNOSIS — E039 Hypothyroidism, unspecified: Secondary | ICD-10-CM | POA: Diagnosis not present

## 2021-08-23 DIAGNOSIS — E7800 Pure hypercholesterolemia, unspecified: Secondary | ICD-10-CM

## 2021-08-23 DIAGNOSIS — G6289 Other specified polyneuropathies: Secondary | ICD-10-CM | POA: Diagnosis not present

## 2021-08-23 NOTE — Progress Notes (Signed)
[name], female   DOB: 1928-09-10, 88 y.o.   MRN: [id number]   Subjective:    [name], female    DOB: 1928-07-12, 88 y.o.   MRN: [id number]   Patient here for a scheduled follow up.   Chief Complaint  Patient presents with   Follow-up   .   HPI History of chronic HFpEF, afib and hypertension.  Here to follow up on these issues.  Recent bradycardia and medication adjusted.  Zio monitor placed.  She subsequently had recurrent tachycardia and monitor showing afib - rates 70-120s.  Is back on metoprolol '100mg'$  bid and diltizaem '120mg'$  q day.  Comes in today - heart rate 120 on presentation.  EKG - ventricular rate 120 (appears more regular).  Feels breathing is stable.  Has noticed some right breast/chest/rib pain.  Tender to touch.  No known injury.  No increased cough or congestion.  Eating.  No nausea or vomiting.  No abdominal pain.  Bowels moving.  Shingles pain - improved.     Past Medical History:  Diagnosis Date   (HFpEF) heart failure with preserved ejection fraction ([place])    a. 07/2017 Echo: EF 50-55%, no rwma, mild AS/AI, mod MR, mod dil LA, mod TR, PASP 71mHg; b. 03/2021 Echo: EF 60-65%. No rwma. Nl RV fxn. Sev BAE. Mild MR. Mild AI/AS.   Autoimmune hepatitis ([place]    followed by Dr SGustavo Lah  Fibrocystic breast disease    Glaucoma    Hypercholesterolemia    Hypertension    Hypothyroidism    Inflammatory arthritis    MI (myocardial infarction) ([place]    Mitral regurgitation    a. 07/2017 Echo: Mod MR; b. 08/2017 Cath: 4+/Severe MR; c. 08/2017 TEE: Mild MR; d. 03/2021 Echo: Mild MR.   Neuropathy    Non-obstructive Coronary artery disease    a. 05/2014 NSTEMI/Cath: LM nl, LAD mild dzs, LCX 60ost hazy (? culprit), RCA mild dzs. EF 60% by echo-->Med Rx; b. 08/2017 Cath: LM 30ost, LAD min irregs, LCX small, 40ost/p, RCA large, min irregs, RPDA/RPL1 nl, EF 50-55%. 4+MR.   Osteoarthritis    Permanent atrial fibrillation (HCC)    a. CHA2DS2VASc =  6-->Xarelto.   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   Past Surgical History:  Procedure Laterality Date   ABDOMINAL HYSTERECTOMY  1963   partial, secondary to fibroids   APPENDECTOMY     Billroth     CARDIAC CATHETERIZATION  05/12/14   [facility]   CARDIOVERSION N/A 12/26/2016   Procedure: CARDIOVERSION;  Surgeon: AWellington Hampshire MD;  Location: [facility] ORS;  Service: Cardiovascular;  Laterality: N/A;   CARDIOVERSION N/A 05/16/2017   Procedure: CARDIOVERSION;  Surgeon: AWellington Hampshire MD;  Location: [facility] ORS;  Service: Cardiovascular;  Laterality: N/A;   CARDIOVERSION N/A 06/09/2017   Procedure: CARDIOVERSION;  Surgeon: AWellington Hampshire MD;  Location: [facility] ORS;  Service: Cardiovascular;  Laterality: N/A;   CARDIOVERSION N/A 11/30/2018   Procedure: CARDIOVERSION;  Surgeon: AWellington Hampshire MD;  Location: [facility] ORS;  Service: Cardiovascular;  Laterality: N/A;   CHOLECYSTECTOMY  1998   COLONOSCOPY  2009   RIGHT/LEFT HEART CATH AND CORONARY ANGIOGRAPHY Bilateral 08/21/2017   Procedure: RIGHT/LEFT HEART CATH AND CORONARY ANGIOGRAPHY;  Surgeon: AWellington Hampshire MD;  [facility];  Service: Cardiovascular;  Laterality: Bilateral;   TEE WITHOUT CARDIOVERSION N/A 08/25/2017   Procedure: TRANSESOPHAGEAL ECHOCARDIOGRAM (TEE);  Surgeon:  Bensimhon, Shaune Pascal, MD;  Location: [facility] ENDOSCOPY;  Service: Cardiovascular;  Laterality: N/A;   UPPER GI ENDOSCOPY  2004   Family History  Problem Relation Age of Onset   Stroke Mother    Esophageal cancer Brother        also had lung cancer   Ovarian cancer Daughter    Colon cancer Daughter    Breast cancer Neg Hx    Social History   Socioeconomic History   Marital status: Widowed    Spouse name: Not on file   Number of children: 2   Years of education: Not on file   Highest education level: Not on file  Occupational History   Occupation: retired  Tobacco Use   Smoking status: Never    Smokeless tobacco: Never  Vaping Use   Vaping Use: Never used  Substance and Sexual Activity   Alcohol use: No    Alcohol/week: 0.0 standard drinks of alcohol   Drug use: No   Sexual activity: Never  Other Topics Concern   Not on file  Social History Narrative   Not on file   Social Determinants of Health   Financial Resource Strain: Low Risk  (05/28/2021)   Overall Financial Resource Strain (CARDIA)    Difficulty of Paying Living Expenses: Not hard at all  Food Insecurity: No Food Insecurity (05/28/2021)   Hunger Vital Sign    Worried About Running Out of Food in the Last Year: Never true    [place] in the Last Year: Never true  Transportation Needs: No Transportation Needs (05/28/2021)   PRAPARE - Hydrologist (Medical): No    Lack of Transportation (Non-Medical): No  Physical Activity: Insufficiently Active (05/28/2021)   Exercise Vital Sign    Days of Exercise per Week: 3 days    Minutes of Exercise per Session: 40 min  Stress: No Stress Concern Present (05/28/2021)   [place]    Feeling of Stress : Not at all  Social Connections: Unknown (05/28/2021)   Social Connection and Isolation Panel [NHANES]    Frequency of Communication with Friends and Family: More than three times a week    Frequency of Social Gatherings with Friends and Family: More than three times a week    Attends Religious Services: Not on Advertising copywriter or Organizations: Not on file    Attends Archivist Meetings: Not on file    Marital Status: Not on file     Review of Systems  Constitutional:  Negative for appetite change and unexpected weight change.  HENT:  Negative for congestion and sinus pressure.   Respiratory:  Negative for cough, chest tightness and shortness of breath.   Cardiovascular:  Negative for palpitations and leg swelling.       Chest/rib pain - right -  tender to touch.    Gastrointestinal:  Negative for abdominal pain, diarrhea, nausea and vomiting.  Genitourinary:  Negative for difficulty urinating and dysuria.  Musculoskeletal:  Negative for joint swelling and myalgias.  Skin:  Negative for color change.       Skin changes - right posterior leg - from shingles.  No open lesions.   Neurological:  Negative for dizziness, light-headedness and headaches.  Psychiatric/Behavioral:  Negative for agitation and dysphoric mood.        Objective:     BP (!) 150/92 (BP Location: Left Arm, Patient Position:  Sitting, Cuff Size: Normal)   Pulse (!) 118   Temp 98.5 F (36.9 C) (Oral)   Ht '5\' 2"'$  (1.575 m)   Wt 136 lb (61.7 kg)   SpO2 96%   BMI 24.87 kg/m  Wt Readings from Last 3 Encounters:  08/23/21 136 lb (61.7 kg)  08/15/21 133 lb 12.8 oz (60.7 kg)  08/14/21 134 lb (60.8 kg)    Physical Exam Vitals reviewed.  Constitutional:      General: She is not in acute distress.    Appearance: Normal appearance.  HENT:     Head: Normocephalic and atraumatic.     Right Ear: External ear normal.     Left Ear: External ear normal.  Eyes:     General: No scleral icterus.       Right eye: No discharge.        Left eye: No discharge.     Conjunctiva/sclera: Conjunctivae normal.  Neck:     Thyroid: No thyromegaly.  Cardiovascular:     Rate and Rhythm: Tachycardia present.     Comments: Ventricular rate 100-120 in office.  Pulmonary:     Effort: No respiratory distress.     Breath sounds: Normal breath sounds. No wheezing.  Abdominal:     General: Bowel sounds are normal.     Palpations: Abdomen is soft.     Tenderness: There is no abdominal tenderness.  Musculoskeletal:        General: No swelling or tenderness.     Cervical back: Neck supple. No tenderness.  Lymphadenopathy:     Cervical: No cervical adenopathy.  Skin:    Findings: No erythema or rash.  Neurological:     Mental Status: She is alert.  Psychiatric:        Mood and  Affect: Mood normal.        Behavior: Behavior normal.      Outpatient Encounter Medications as of 08/23/2021  Medication Sig   allopurinol (ZYLOPRIM) 100 MG tablet TAKE 1/2 TABLET(50 MG) BY MOUTH DAILY   colchicine 0.6 MG tablet Take 0.6 mg by mouth daily as needed.   CYANOCOBALAMIN PO Take 500 mcg by mouth once a week.   diltiazem (CARDIZEM CD) 120 MG 24 hr capsule Take 1 capsule (120 mg total) by mouth daily.   dorzolamide (TRUSOPT) 2 % ophthalmic solution Place 1 drop into the left eye 2 (two) times daily.   fluticasone (FLONASE) 50 MCG/ACT nasal spray Place 2 sprays into both nostrils daily as needed for allergies.    gabapentin (NEURONTIN) 300 MG capsule One capsule tid and two capsules q hs.   hydroxychloroquine (PLAQUENIL) 200 MG tablet Take 200 mg by mouth every other day.   ipratropium (ATROVENT) 0.03 % nasal spray SMARTSIG:2 Spray(s) Both Nares 3 Times Daily PRN   latanoprost (XALATAN) 0.005 % ophthalmic solution Place 1 drop into both eyes at bedtime.    levothyroxine (SYNTHROID) 112 MCG tablet TAKE 1 TABLET(112 MCG) BY MOUTH DAILY BEFORE BREAKFAST   metoprolol tartrate (LOPRESSOR) 100 MG tablet Take 1 tablet (100 mg total) by mouth 2 (two) times daily.   Multiple Vitamin (MULTI-VITAMIN) tablet Take 1 tablet by mouth at bedtime.    Probiotic Product (ALIGN) 4 MG CAPS Take one capsule q day   torsemide (DEMADEX) 20 MG tablet Take 1 tablet (20 mg total) by mouth every other day.   traMADol (ULTRAM) 50 MG tablet Take 1 tablet (50 mg total) by mouth 2 (two) times daily as needed (for pain.).  ursodiol (ACTIGALL) 300 MG capsule Take 300-600 mg by mouth See admin instructions. Take 2 capsules (600 mg) daily in the morning   & 1 capsule (300 mg) at night.   XARELTO 15 MG TABS tablet TAKE 1 TABLET(15 MG) BY MOUTH DAILY WITH SUPPER   [DISCONTINUED] cefdinir (OMNICEF) 300 MG capsule Take 1 capsule (300 mg total) by mouth 2 (two) times daily. (Patient not taking: Reported on 08/23/2021)    No facility-administered encounter medications on file as of 08/23/2021.     Lab Results  Component Value Date   WBC 6.5 07/30/2021   HGB 12.0 07/30/2021   HCT 38.1 07/30/2021   PLT 192.0 07/30/2021   GLUCOSE 77 08/10/2021   CHOL 162 06/05/2021   TRIG 153.0 (H) 06/05/2021   HDL 59.50 06/05/2021   LDLDIRECT 143.5 12/17/2012   LDLCALC 72 06/05/2021   ALT 19 06/05/2021   AST 16 06/05/2021   NA 137 08/10/2021   K 5.0 08/10/2021   CL 106 08/10/2021   CREATININE 1.15 08/10/2021   BUN 25 (H) 08/10/2021   CO2 23 08/10/2021   TSH 4.15 07/30/2021   INR 1.63 12/29/2017   HGBA1C 5.6 01/27/2018       Assessment & Plan:   Problem List Items Addressed This Visit     Anemia    Has iron deficiency.  Follow cbc and iron studies.       Relevant Orders   CBC with Differential/Platelet   Atherosclerotic heart disease of native coronary artery with other forms of angina pectoris ([place])    Continue risk factor modification.  Continue metoprolol and xarelto.       Autoimmune hepatitis ([place])    Follow liver function tests.  Followed by GI.       Relevant Orders   Hepatic function panel   Benign hypertension    On metoprolol and diltiazem.   Follow pressures.       Relevant Orders   Basic metabolic panel   Chronic diastolic heart failure ([place])    Weight has overall been stable.  Up 2-3 pounds from recent visit.  Continue to weigh daily.  Torsemide as directed.  Follow. Breathing stable       CKD (chronic kidney disease) stage 3, GFR 30-59 ml/min (HCC)     Avoid antiinflammatories.  Follow metabolic panel.       GERD (gastroesophageal reflux disease)    Upper symptoms controlled.  On protonix.       Hypercholesteremia    Unable to take statin medication.  Follow lipid panel.       Relevant Orders   Lipid panel   Hypothyroidism    On thyroid replacement.  Follow tsh. Recent check wnl.       Iron deficiency anemia    Seeing Dr Tasia Catchings.  Last hgb wnl.  Follow cbc and iron  studies.       Relevant Orders   CBC with Differential/Platelet   Osteoporosis    prolia - last 04/24/21 (Dr Gabriel Carina).       Relevant Orders   VITAMIN D 25 Hydroxy (Vit-D Deficiency, Fractures)   Persistent atrial fibrillation (HCC) - Primary    Recent - SR/SB.  Medication adjusted.  Has since been back in afib with increased heart rate.  Medications added back and she is now on metoprolol '100mg'$  bid and restarted diltiazem '120mg'$  q day.  Wearing zio monitor.  In reviewing, had recent episode - slow afib - 37 bpm for 60 seconds.  Today, heart  rate initially 120.  EKG - ventricular rate 120 (rhythm appears to be more regular).  Concern regarding tacycardia/bradycardia syndrome.  Has been referred to Dr Quentin Ore.  States has an appt to see him at the end of August.  Discussed with her today regarding holding on making any changes in her medication given issues with bradycardia as outlined.  Discuss with cardiology.  Pt agreeable.   Addendum:  Contacted Dr Fletcher Anon.  Agreed with holding on making any changes in medication.  He contacted Dr Quentin Ore regarding earlier appt.         Relevant Orders   EKG 12-Lead (Completed)   Primary biliary cholangitis ([place])    Followed by GI. Stable. Follow liver function tests.        Pulmonary hypertension, unspecified ([place])    Followed by cardiology.       Rib pain on right side    Tender with palpation.  No known trauma.  No bruising or rash.  No palpable abnormality of breast.  On tylenol, gabapentin and tramadol for shingles pain.  Check xray.  No pain with deep breathing and no increased sob.       Relevant Orders   DG Ribs Unilateral W/Chest Right (Completed)   Shingles    Pain has improved.  Continue current medication regimen as outlined above.  Follow.       Peripheral neuropathy (Chronic)    On gabapentin.         Einar Pheasant, MD

## 2021-08-25 ENCOUNTER — Telehealth: Payer: Self-pay | Admitting: Internal Medicine

## 2021-08-25 NOTE — Assessment & Plan Note (Signed)
Recent - SR/SB.  Medication adjusted.  Has since been back in afib with increased heart rate.  Medications added back and she is now on metoprolol '100mg'$  bid and restarted diltiazem '120mg'$  q day.  Wearing zio monitor.  In reviewing, had recent episode - slow afib - 37 bpm for 60 seconds.  Today, heart rate initially 120.  EKG - ventricular rate 120 (rhythm appears to be more regular).  Concern regarding tacycardia/bradycardia syndrome.  Has been referred to Dr Quentin Ore.  States has an appt to see him at the end of August.  Discussed with her today regarding holding on making any changes in her medication given issues with bradycardia as outlined.  Discuss with cardiology.  Pt agreeable.   Addendum:  Contacted Dr Fletcher Anon.  Agreed with holding on making any changes in medication.  He contacted Dr Quentin Ore regarding earlier appt.

## 2021-08-25 NOTE — Telephone Encounter (Signed)
-----   Message from Theora Gianotti, NP sent at 08/15/2021 12:31 PM EDT ----- Regarding: RE: update Thanks.  I saw her earlier and her HR was in the 120's - hadn't taken AM metoprolol yet.  Zio thus far has shown afib w/ rates in the 70's to 120's (ecg today looks more like flutter).  She's surprisingly resilient despite relative tachycardia throughout the past week.  I did add back the dilt cd 120, as this did the trick before, and it's still hard to imagine that after years of being in afib, her 88 y/o heart has decided to be paroxysmal w/ tachy-brady.  But if it has, we did discuss that she'd end up w/ a pacer.  She's wearing the zio for another week so we'll be able to assess her HR response w/ addition of dilt and catch is she suddenly becomes brady.  She has f/u here again in 2 wks.  Take care,  Gerald Stabs ----- Message ----- From: Einar Pheasant, MD Sent: 08/15/2021   5:21 AM EDT To: Theora Gianotti, NP Subject: update                                         Ms Rabelo is scheduled to see you today.  I saw her yesterday for follow up.  As you know, her medication was recently adjusted given SB - last visit.  She is currently on metoprolol '100mg'$  bid.  Has zio monitor.  Has had some issues of intermittent increased heart rate recently.  Per 08/10/21 phone message, afib episode. I performed EKG in office yesterday due to irregularity noted on exam.  Ventricular rate 100.  I did not change her medication, given that she is seeing you today.  I did previously mention to her the possibility of EP evaluation.  She is currently off torsemide(due to previous - worsened kidney function).  Weight is stable.  She is weighing daily.  I am still trying to get her shingles pain controlled.  Just wanted to give you an update prior to her visit.  Let me know if you need me to do anything.  I appreciate you seeing her.    Thank you  Randell Patient

## 2021-08-25 NOTE — Telephone Encounter (Signed)
Late entry - spoke to Ms Sanzone - 08/23/21 (2100)  and she informed me she was feeling better. Heart rate had slowed down.  Contacted Dr Fletcher Anon.  He agreed with holding on making any medication changes.  He contacted Dr Quentin Ore regarding a possible earlier appt.

## 2021-08-27 ENCOUNTER — Encounter: Payer: Self-pay | Admitting: Internal Medicine

## 2021-08-27 NOTE — Assessment & Plan Note (Signed)
On thyroid replacement.  Follow tsh. Recent check wnl.

## 2021-08-27 NOTE — Assessment & Plan Note (Signed)
Has iron deficiency.  Follow cbc and iron studies.  

## 2021-08-27 NOTE — Assessment & Plan Note (Signed)
Followed by cardiology 

## 2021-08-27 NOTE — Assessment & Plan Note (Signed)
Upper symptoms controlled.  On protonix.  

## 2021-08-27 NOTE — Assessment & Plan Note (Addendum)
prolia - last 04/24/21 (Dr Solum).  

## 2021-08-27 NOTE — Assessment & Plan Note (Signed)
On gabapentin.  

## 2021-08-27 NOTE — Assessment & Plan Note (Signed)
Pain has improved.  Continue current medication regimen as outlined above.  Follow.

## 2021-08-27 NOTE — Assessment & Plan Note (Signed)
Followed by GI.  Stable.  Follow liver function tests.  

## 2021-08-27 NOTE — Assessment & Plan Note (Addendum)
On metoprolol and diltiazem.   Follow pressures.  

## 2021-08-27 NOTE — Assessment & Plan Note (Signed)
Weight has overall been stable.  Up 2-3 pounds from recent visit.  Continue to weigh daily.  Torsemide as directed.  Follow. Breathing stable

## 2021-08-27 NOTE — Assessment & Plan Note (Signed)
Seeing Dr Yu.  Last hgb wnl.  Follow cbc and iron studies.  

## 2021-08-27 NOTE — Assessment & Plan Note (Signed)
Unable to take statin medication.  Follow lipid panel.  

## 2021-08-27 NOTE — Assessment & Plan Note (Addendum)
Tender with palpation.  No known trauma.  No bruising or rash.  No palpable abnormality of breast.  On tylenol, gabapentin and tramadol for shingles pain.  Check xray.  No pain with deep breathing and no increased sob.

## 2021-08-27 NOTE — Assessment & Plan Note (Signed)
Avoid antiinflammatories.  Follow metabolic panel.  

## 2021-08-27 NOTE — Assessment & Plan Note (Signed)
Continue risk factor modification.  Continue metoprolol and xarelto.  

## 2021-08-27 NOTE — Assessment & Plan Note (Signed)
Follow liver function tests.  Followed by GI.  

## 2021-08-29 ENCOUNTER — Encounter: Payer: Self-pay | Admitting: Oncology

## 2021-08-29 ENCOUNTER — Telehealth: Payer: Self-pay | Admitting: Nurse Practitioner

## 2021-08-29 ENCOUNTER — Ambulatory Visit: Payer: Medicare Other | Admitting: Nurse Practitioner

## 2021-08-29 NOTE — Telephone Encounter (Signed)
Spoke with patient and reviewed that provider said she really didn't need to come in today since she is coming in next week to see Dr. Quentin Ore. Patient has appt 09/05/21 with Dr. Quentin Ore. She was agreeable with this and confirmed appointment next week. No further needs at this time.

## 2021-09-05 ENCOUNTER — Other Ambulatory Visit
Admission: RE | Admit: 2021-09-05 | Discharge: 2021-09-05 | Disposition: A | Discharge status: Home / Self Care | Payer: Medicare Other | Attending: Cardiology | Admitting: Cardiology

## 2021-09-05 ENCOUNTER — Encounter: Payer: Self-pay | Admitting: Cardiology

## 2021-09-05 ENCOUNTER — Ambulatory Visit (INDEPENDENT_AMBULATORY_CARE_PROVIDER_SITE_OTHER): Payer: Medicare Other | Admitting: Cardiology

## 2021-09-05 ENCOUNTER — Encounter (HOSPITAL_BASED_OUTPATIENT_CLINIC_OR_DEPARTMENT_OTHER): Payer: Self-pay | Admitting: *Deleted

## 2021-09-05 ENCOUNTER — Encounter: Payer: Self-pay | Admitting: *Deleted

## 2021-09-05 VITALS — BP 137/86 | HR 110 | Ht 62.0 in | Wt 133.0 lb

## 2021-09-05 DIAGNOSIS — I5032 Chronic diastolic (congestive) heart failure: Secondary | ICD-10-CM | POA: Insufficient documentation

## 2021-09-05 DIAGNOSIS — I110 Hypertensive heart disease with heart failure: Secondary | ICD-10-CM | POA: Diagnosis not present

## 2021-09-05 DIAGNOSIS — I4811 Longstanding persistent atrial fibrillation: Secondary | ICD-10-CM | POA: Insufficient documentation

## 2021-09-05 DIAGNOSIS — I495 Sick sinus syndrome: Secondary | ICD-10-CM | POA: Insufficient documentation

## 2021-09-05 DIAGNOSIS — Z01818 Encounter for other preprocedural examination: Secondary | ICD-10-CM

## 2021-09-05 LAB — BASIC METABOLIC PANEL
Anion gap: 8 (ref 5–15)
BUN: 39 mg/dL — ABNORMAL HIGH (ref 8–23)
CO2: 23 mmol/L (ref 22–32)
Calcium: 9.2 mg/dL (ref 8.9–10.3)
Chloride: 108 mmol/L (ref 98–111)
Creatinine, Ser: 1.11 mg/dL — ABNORMAL HIGH (ref 0.44–1.00)
GFR, Estimated: 47 mL/min — ABNORMAL LOW (ref >60)
Glucose, Bld: 87 mg/dL (ref 70–99)
Potassium: 4.8 mmol/L (ref 3.5–5.1)
Sodium: 139 mmol/L (ref 135–145)

## 2021-09-05 LAB — CBC WITH DIFFERENTIAL/PLATELET
Abs Immature Granulocytes: 0.01 10*3/uL (ref 0.00–0.07)
Basophils Absolute: 0 10*3/uL (ref 0.0–0.1)
Basophils Relative: 1 %
Eosinophils Absolute: 0.2 10*3/uL (ref 0.0–0.5)
Eosinophils Relative: 4 %
HCT: 42.6 % (ref 36.0–46.0)
Hemoglobin: 13.3 g/dL (ref 12.0–15.0)
Immature Granulocytes: 0 %
Lymphocytes Relative: 13 %
Lymphs Abs: 0.7 10*3/uL (ref 0.7–4.0)
MCH: 32.8 pg (ref 26.0–34.0)
MCHC: 31.2 g/dL (ref 30.0–36.0)
MCV: 104.9 fL — ABNORMAL HIGH (ref 80.0–100.0)
Monocytes Absolute: 0.5 10*3/uL (ref 0.1–1.0)
Monocytes Relative: 10 %
Neutro Abs: 3.7 10*3/uL (ref 1.7–7.7)
Neutrophils Relative %: 72 %
Platelets: 192 10*3/uL (ref 150–400)
RBC: 4.06 MIL/uL (ref 3.87–5.11)
RDW: 14.5 % (ref 11.5–15.5)
WBC: 5.2 10*3/uL (ref 4.0–10.5)
nRBC: 0 % (ref 0.0–0.2)

## 2021-09-05 NOTE — Patient Instructions (Addendum)
Medications: Your physician recommends that you continue on your current medications as directed. Please refer to the Current Medication list given to you today. *If you need a refill on your cardiac medications before your next appointment, please call your pharmacy*  Lab Work: CBC/ diff, BMP If you have labs (blood work) drawn today and your tests are completely normal, you will receive your results only by: [place] (if you have MyChart) OR A paper copy in the mail If you have any lab test that is abnormal or we need to change your treatment, we will call you to review the results.  Testing/Procedures: Your physician has recommended that you have a pacemaker inserted. A pacemaker is a small device that is placed under the skin of your chest or abdomen to help control abnormal heart rhythms. This device uses electrical pulses to prompt the heart to beat at a normal rate. Pacemakers are used to treat heart rhythms that are too slow. Wire (leads) are attached to the pacemaker that goes into the chambers of you heart. This is done in the hospital and usually requires and overnight stay. Please see the instruction sheet given to you today for more information.   Follow-Up: At [facility], you and your health needs are our priority.  As part of our continuing mission to provide you with exceptional heart care, we have created designated Provider Care Teams.  These Care Teams include your primary Cardiologist (physician) and Advanced Practice Providers (APPs -  Physician Assistants and Nurse Practitioners) who all work together to provide you with the care you need, when you need it.  Your physician wants you to follow-up in: Pacemaker implant July 21. Post procedure- 10-14 day with Device Clinic and 91 day post with Dr. Quentin Ore.  We recommend signing up for the patient portal called "MyChart".  Sign up information is provided on this After Visit Summary.  MyChart is used to connect with  patients for Virtual Visits (Telemedicine).  Patients are able to view lab/test results, encounter notes, upcoming appointments, etc.  Non-urgent messages can be sent to your provider as well.   To learn more about what you can do with MyChart, go to [email or web address].    Any Other Special Instructions Will Be Listed Below (If Applicable).  Pacemaker Implantation, Adult Pacemaker implantation is a procedure to place a pacemaker inside the chest. A pacemaker is a small computer that sends electrical signals to the heart and helps the heart beat normally. A pacemaker also stores information about heart rhythms. You may need pacemaker implantation if you have: A slow heartbeat (bradycardia). Loss of consciousness that happens repeatedly (syncope) or repeated episodes of dizziness or light-headedness because of an irregular heart rate. Shortness of breath (dyspnea) due to heart problems. The pacemaker usually attaches to your heart through a wire called a lead. One or two leads may be needed. There are different types of pacemakers: Transvenous pacemaker. This type is placed under the skin or muscle of your upper chest area. The lead goes through a vein in the chest area to reach the inside of the heart. Epicardial pacemaker. This type is placed under the skin or muscle of your chest or abdomen. The lead goes through your chest to the outside of the heart. Tell a health care provider about: Any allergies you have. All medicines you are taking, including vitamins, herbs, eye drops, creams, and over-the-counter medicines. Any problems you or family members have had with anesthetic medicines. Any blood or bone disorders  you have. Any surgeries you have had. Any medical conditions you have. Whether you are pregnant or may be pregnant. What are the risks? Generally, this is a safe procedure. However, problems may occur, including: Infection. Bleeding. Failure of the pacemaker or the  lead. Collapse of a lung or bleeding into a lung. Blood clot inside a blood vessel with a lead. Damage to the heart. Infection inside the heart (endocarditis). Allergic reactions to medicines. What happens before the procedure? Staying hydrated Follow instructions from your health care provider about hydration, which may include: Up to 2 hours before the procedure - you may continue to drink clear liquids, such as water, clear fruit juice, black coffee, and plain tea.  Eating and drinking restrictions Follow instructions from your health care provider about eating and drinking, which may include: 8 hours before the procedure - stop eating heavy meals or foods, such as meat, fried foods, or fatty foods. 6 hours before the procedure - stop eating light meals or foods, such as toast or cereal. 6 hours before the procedure - stop drinking milk or drinks that contain milk. 2 hours before the procedure - stop drinking clear liquids. Medicines Ask your health care provider about: Changing or stopping your regular medicines. This is especially important if you are taking diabetes medicines or blood thinners. Taking medicines such as aspirin and ibuprofen. These medicines can thin your blood. Do not take these medicines unless your health care provider tells you to take them. Taking over-the-counter medicines, vitamins, herbs, and supplements. Tests You may have: A heart evaluation. This may include: An electrocardiogram (ECG). This involves placing patches on your skin to check your heart rhythm. A chest X-ray. An echocardiogram. This is a test that uses sound waves (ultrasound) to produce an image of the heart. A cardiac rhythm monitor. This is used to record your heart rhythm and any events for a longer period of time. Blood tests. Genetic testing. General instructions Do not use any products that contain nicotine or tobacco for at least 4 weeks before the procedure. These products include  cigarettes, e-cigarettes, and chewing tobacco. If you need help quitting, ask your health care provider. Ask your health care provider: How your surgery site will be marked. What steps will be taken to help prevent infection. These steps may include: Removing hair at the surgery site. Washing skin with a germ-killing soap. Receiving antibiotic medicine. Plan to have someone take you home from the hospital or clinic. If you will be going home right after the procedure, plan to have someone with you for 24 hours. What happens during the procedure? An IV will be inserted into one of your veins. You will be given one or more of the following: A medicine to help you relax (sedative). A medicine to numb the area (local anesthetic). A medicine to make you fall asleep (general anesthetic). The next steps vary depending on the type of pacemaker you will be getting. If you are getting a transvenous pacemaker: An incision will be made in your upper chest. A pocket will be made for the pacemaker. It may be placed under the skin or between layers of muscle. The lead will be inserted into a blood vessel that goes to the heart. While X-rays are taken by an imaging machine (fluoroscopy), the lead will be advanced through the vein to the inside of your heart. The other end of the lead will be tunneled under the skin and attached to the pacemaker. If you are getting  an epicardial pacemaker: An incision will be made near your ribs or breastbone (sternum) for the lead. The lead will be attached to the outside of your heart. Another incision will be made in your chest or upper abdomen to create a pocket for the pacemaker. The free end of the lead will be tunneled under the skin and attached to the pacemaker. The transvenous or epicardial pacemaker will be tested. Imaging studies may be done to check the lead position. The incisions will be closed with stitches (sutures), adhesive strips, or skin  glue. Bandages (dressings) will be placed over the incisions. The procedure may vary among health care providers and hospitals. What happens after the procedure? Your blood pressure, heart rate, breathing rate, and blood oxygen level will be monitored until you leave the hospital or clinic. You may be given antibiotics. You will be given pain medicine. An ECG and chest X-rays will be done. You may need to wear a continuous type of ECG (Holter monitor) to check your heart rhythm. Your health care provider will program the pacemaker. If you were given a sedative during the procedure, it can affect you for several hours. Do not drive or operate machinery until your health care provider says that it is safe. You will be given a pacemaker identification card. This card lists the implant date, device model, and manufacturer of your pacemaker. Summary A pacemaker is a small computer that sends electrical signals to the heart and helps the heart beat normally. There are different types of pacemakers. A pacemaker may be placed under the skin or muscle of your chest or abdomen. Follow instructions from your health care provider about eating and drinking and about taking medicines before the procedure. This information is not intended to replace advice given to you by your health care provider. Make sure you discuss any questions you have with your health care provider. Document Revised: 11/07/2020 Document Reviewed: 01/27/2019 Elsevier Patient Education  [place].

## 2021-09-05 NOTE — Progress Notes (Signed)
Electrophysiology Office Note:    Date:  09/05/2021   ID:  Ruth Roth, DOB [birth year], MRN [id number]  PCP:  Einar Pheasant, MD  [facility] Cardiologist:  [name], MD  [facility] Electrophysiologist:  Vickie Epley, MD   Referring MD: Einar Pheasant, MD   Chief Complaint: Tachycardia-bradycardia syndrome  History of Present Illness:    Ruth Roth is a 88 y.o. female who presents for an evaluation of tachycardia-bradycardia syndrome at the request of Dr. Nicki Reaper and Jorja Loa. Their medical history includes chronic diastolic heart failure, hypertension, hypothyroidism, mitral regurgitation, atrial fibrillation on Xarelto.  The patient [name] on August 15, 2021.  In the past she has had episodes of symptomatic tachycardia while in atrial fibrillation with ventricular rates greater than 120 bpm.  Use of nodal blockers has resulted in symptomatic bradycardia with rates in the 30s.     Past Medical History:  Diagnosis Date   (HFpEF) heart failure with preserved ejection fraction ([place])    a. 07/2017 Echo: EF 50-55%, no rwma, mild AS/AI, mod MR, mod dil LA, mod TR, PASP 75mHg; b. 03/2021 Echo: EF 60-65%. No rwma. Nl RV fxn. Sev BAE. Mild MR. Mild AI/AS.   Autoimmune hepatitis ([facility]    followed by Dr SGustavo Lah  Fibrocystic breast disease    Glaucoma    Hypercholesterolemia    Hypertension    Hypothyroidism    Inflammatory arthritis    [place]ocardial infarction) [place]    Mitral regurgitation    a. 07/2017 Echo: Mod MR; b. 08/2017 Cath: 4+/Severe MR; c. 08/2017 TEE: Mild MR; d. 03/2021 Echo: Mild MR.   Neuropathy    Non-obstructive Coronary artery disease    a. 05/2014 NSTEMI/Cath: LM nl, LAD mild dzs, LCX 60ost hazy (? culprit), RCA mild dzs. EF 60% by echo-->Med Rx; b. 08/2017 Cath: LM 30ost, LAD min irregs, LCX small, 40ost/p, RCA large, min irregs, RPDA/RPL1 nl, EF 50-55%. 4+MR.   Osteoarthritis    Permanent atrial fibrillation (HCC)    a. CHA2DS2VASc =  6-->Xarelto.   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    Past Surgical History:  Procedure Laterality Date   ABDOMINAL HYSTERECTOMY  1963   partial, secondary to fibroids   APPENDECTOMY     Billroth     CARDIAC CATHETERIZATION  05/12/14   [facility]   CARDIOVERSION N/A 12/26/2016   Procedure: CARDIOVERSION;  Surgeon: AWellington Hampshire MD;  Location: [facility] ORS;  Service: Cardiovascular;  Laterality: N/A;   CARDIOVERSION N/A 05/16/2017   Procedure: CARDIOVERSION;  Surgeon: AWellington Hampshire MD;  Location: [facility] ORS;  Service: Cardiovascular;  Laterality: N/A;   CARDIOVERSION N/A 06/09/2017   Procedure: CARDIOVERSION;  Surgeon: AWellington Hampshire MD;  Location: [facility] ORS;  Service: Cardiovascular;  Laterality: N/A;   CARDIOVERSION N/A 11/30/2018   Procedure: CARDIOVERSION;  Surgeon: AWellington Hampshire MD;  Location: [facility] ORS;  Service: Cardiovascular;  Laterality: N/A;   CHOLECYSTECTOMY  1998   COLONOSCOPY  2009   RIGHT/LEFT HEART CATH AND CORONARY ANGIOGRAPHY Bilateral 08/21/2017   Procedure: RIGHT/LEFT HEART CATH AND CORONARY ANGIOGRAPHY;  Surgeon: AWellington Hampshire MD;  [facility];  Service: Cardiovascular;  Laterality: Bilateral;   TEE WITHOUT CARDIOVERSION N/A 08/25/2017   Procedure: TRANSESOPHAGEAL ECHOCARDIOGRAM (TEE);  Surgeon: BJolaine Artist MD;  [facility];  Service: Cardiovascular;  Laterality: N/A;   UPPER GI ENDOSCOPY  2004    Current Medications:  Current Meds  Medication Sig   allopurinol (ZYLOPRIM) 100 MG tablet TAKE 1/2 TABLET(50 MG) BY MOUTH DAILY   colchicine 0.6 MG tablet Take 0.6 mg by mouth daily as needed.   CYANOCOBALAMIN PO Take 500 mcg by mouth once a week.   diltiazem (CARDIZEM CD) 120 MG 24 hr capsule Take 1 capsule (120 mg total) by mouth daily.   dorzolamide (TRUSOPT) 2 % ophthalmic solution Place 1 drop into the left eye 2 (two) times daily.   fluticasone (FLONASE) 50 MCG/ACT  nasal spray Place 2 sprays into both nostrils daily as needed for allergies.    gabapentin (NEURONTIN) 300 MG capsule One capsule tid and two capsules q hs.   hydroxychloroquine (PLAQUENIL) 200 MG tablet Take 200 mg by mouth every other day.   ipratropium (ATROVENT) 0.03 % nasal spray SMARTSIG:2 Spray(s) Both Nares 3 Times Daily PRN   latanoprost (XALATAN) 0.005 % ophthalmic solution Place 1 drop into both eyes at bedtime.    levothyroxine (SYNTHROID) 112 MCG tablet TAKE 1 TABLET(112 MCG) BY MOUTH DAILY BEFORE BREAKFAST   metoprolol tartrate (LOPRESSOR) 100 MG tablet Take 1 tablet (100 mg total) by mouth 2 (two) times daily.   Multiple Vitamin (MULTI-VITAMIN) tablet Take 1 tablet by mouth at bedtime.    Probiotic Product (ALIGN) 4 MG CAPS Take one capsule q day   torsemide (DEMADEX) 20 MG tablet Take 1 tablet (20 mg total) by mouth every other day.   traMADol (ULTRAM) 50 MG tablet Take 1 tablet (50 mg total) by mouth 2 (two) times daily as needed (for pain.).   ursodiol (ACTIGALL) 300 MG capsule Take 300-600 mg by mouth See admin instructions. Take 2 capsules (600 mg) daily in the morning   & 1 capsule (300 mg) at night.   XARELTO 15 MG TABS tablet TAKE 1 TABLET(15 MG) BY MOUTH DAILY WITH SUPPER     Allergies:   Statins, Hydroxychloroquine, Haldol [haloperidol lactate], Librax [chlordiazepoxide-clidinium], Plavix [clopidogrel bisulfate], and Ramipril   Social History   Socioeconomic History   Marital status: Widowed    Spouse name: Not on file   Number of children: 2   Years of education: Not on file   Highest education level: Not on file  Occupational History   Occupation: retired  Tobacco Use   Smoking status: Never   Smokeless tobacco: Never  Vaping Use   Vaping Use: Never used  Substance and Sexual Activity   Alcohol use: No    Alcohol/week: 0.0 standard drinks of alcohol   Drug use: No   Sexual activity: Never  Other Topics Concern   Not on file  Social History Narrative    Not on file   Social Determinants of Health   Financial Resource Strain: Low Risk  (05/28/2021)   Overall Financial Resource Strain (CARDIA)    Difficulty of Paying Living Expenses: Not hard at all  Food Insecurity: No Food Insecurity (05/28/2021)   Hunger Vital Sign    Worried About Running Out of Food in the Last Year: Never true    [place] in the Last Year: Never true  Transportation Needs: No Transportation Needs (05/28/2021)   PRAPARE - Hydrologist (Medical): No    Lack of Transportation (Non-Medical): No  Physical Activity: Insufficiently Active (05/28/2021)   Exercise Vital Sign    Days of Exercise per Week: 3 days    Minutes of Exercise per Session: 40 min  Stress: No Stress Concern Present (05/28/2021)  [place] Questionnaire    Feeling of Stress : Not at all  Social Connections: Unknown (05/28/2021)   Social Connection and Isolation Panel [NHANES]    Frequency of Communication with Friends and Family: More than three times a week    Frequency of Social Gatherings with Friends and Family: More than three times a week    Attends Religious Services: Not on Advertising copywriter or Organizations: Not on file    Attends Archivist Meetings: Not on file    Marital Status: Not on file     Family History: The patient's family history includes Colon cancer in her daughter; Esophageal cancer in her brother; Ovarian cancer in her daughter; Stroke in her mother. There is no history of Breast cancer.  ROS:   Please see the history of present illness.    All other systems reviewed and are negative.  EKGs/Labs/Other Studies Reviewed:    The following studies were reviewed today:  07/31/2021 ECG      Recent Labs: 06/05/2021: ALT 19 07/30/2021: Hemoglobin 12.0; Platelets 192.0; TSH 4.15 08/10/2021: BUN 25; Creatinine, Ser 1.15; Potassium 5.0; Sodium 137  Recent Lipid  Panel    Component Value Date/Time   CHOL 162 06/05/2021 0959   TRIG 153.0 (H) 06/05/2021 0959   HDL 59.50 06/05/2021 0959   CHOLHDL 3 06/05/2021 0959   VLDL 30.6 06/05/2021 0959   LDLCALC 72 06/05/2021 0959   LDLDIRECT 143.5 12/17/2012 0955    Physical Exam:    VS:  BP 137/86   Pulse (!) 110   Ht '5\' 2"'$  (1.575 m)   Wt 133 lb (60.3 kg)   SpO2 96%   BMI 24.33 kg/m     Wt Readings from Last 3 Encounters:  09/05/21 133 lb (60.3 kg)  08/23/21 136 lb (61.7 kg)  08/15/21 133 lb 12.8 oz (60.7 kg)     GEN:  Well nourished, well developed in no acute distress.  Elderly HEENT: Normal NECK: No JVD; No carotid bruits LYMPHATICS: No lymphadenopathy CARDIAC: Irregularly irregular tachycardic, no murmurs, rubs, gallops RESPIRATORY:  Clear to auscultation without rales, wheezing or rhonchi  ABDOMEN: Soft, non-tender, non-distended MUSCULOSKELETAL:  No edema; No deformity  SKIN: Warm and dry NEUROLOGIC:  Alert and oriented x 3 PSYCHIATRIC:  Normal affect       ASSESSMENT:    1. Tachycardia-bradycardia syndrome ([place])   2. Chronic heart failure with preserved ejection fraction (HFpEF) ([place])   3. Longstanding persistent atrial fibrillation (HCC)    PLAN:    In order of problems listed above:  #Tachybradycardia syndrome Highly symptomatic with fatigue.  Surprisingly she had an episode of sinus bradycardia in May and her atrial fibrillation has widely variable ventricular rates.  I have recommended permanent pacing in an effort to help manage her variable heart rates.  Once pacemaker is implanted, would favor starting antiarrhythmics versus nodal blockers.  I discussed the permanent pacemaker procedure in detail with the patient including the risks and recovery and she wishes to proceed.  We will plan for an Abbott dual-chamber permanent pacemaker with left bundle area lead.  Risks, benefits, alternatives to PPM implantation were discussed in detail with the patient today. The  patient understands that the risks include but are not limited to bleeding, infection, pneumothorax, perforation, tamponade, vascular damage, renal failure, MI, stroke, death, and lead dislodgement and wishes to proceed.  We will therefore schedule device implantation at the next available time.  #  Persistent atrial fibrillation Stop Xarelto 3 days prior to the pacemaker implant.  Likely restart it 5 days after.  Symptomatic with widely variable ventricular rates.   Total time spent with patient today 60 minutes. This includes reviewing records, evaluating the patient and coordinating care.  Medication Adjustments/Labs and Tests Ordered: Current medicines are reviewed at length with the patient today.  Concerns regarding medicines are outlined above.  No orders of the defined types were placed in this encounter.  No orders of the defined types were placed in this encounter.    Signed, Hilton Cork. Quentin Ore, MD, [facility], [facility] 09/05/2021 1:55 PM    Electrophysiology [facility]

## 2021-09-05 NOTE — H&P (View-Only) (Signed)
Electrophysiology Office Note:    Date:  09/05/2021   ID:  Ruth Roth, DOB [birth year], MRN [id number]  PCP:  Einar Pheasant, MD  [facility] Cardiologist:  [name], MD  [facility] Electrophysiologist:  Vickie Epley, MD   Referring MD: Einar Pheasant, MD   Chief Complaint: Tachycardia-bradycardia syndrome  History of Present Illness:    Ruth Roth is a 88 y.o. female who presents for an evaluation of tachycardia-bradycardia syndrome at the request of Dr. Nicki Reaper and Jorja Loa. Their medical history includes chronic diastolic heart failure, hypertension, hypothyroidism, mitral regurgitation, atrial fibrillation on Xarelto.  The patient Ruth Roth on August 15, 2021.  In the past she has had episodes of symptomatic tachycardia while in atrial fibrillation with ventricular rates greater than 120 bpm.  Use of nodal blockers has resulted in symptomatic bradycardia with rates in the 30s.     Past Medical History:  Diagnosis Date   (HFpEF) heart failure with preserved ejection fraction ([place])    a. 07/2017 Echo: EF 50-55%, no rwma, mild AS/AI, mod MR, mod dil LA, mod TR, PASP 69mHg; b. 03/2021 Echo: EF 60-65%. No rwma. Nl RV fxn. Sev BAE. Mild MR. Mild AI/AS.   [place]immune [place]    followed by Dr SGustavo Lah  Fibrocystic breast disease    Glaucoma    Hypercholesterolemia    Hypertension    Hypothyroidism    Inflammatory arthritis    MI (myocardial infarction) ([place]    Mitral regurgitation    a. 07/2017 Echo: Mod MR; b. 08/2017 Cath: 4+/Severe MR; c. 08/2017 TEE: Mild MR; d. 03/2021 Echo: Mild MR.   Neuropathy    Non-obstructive Coronary artery disease    a. 05/2014 NSTEMI/Cath: LM nl, LAD mild dzs, LCX 60ost hazy (? culprit), RCA mild dzs. EF 60% by echo-->Med Rx; b. 08/2017 Cath: LM 30ost, LAD min irregs, LCX small, 40ost/p, RCA large, min irregs, RPDA/RPL1 nl, EF 50-55%. 4+MR.   Osteoarthritis    Permanent atrial fibrillation (HCC)    a. CHA2DS2VASc =  6-->Xarelto.   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    Past Surgical History:  Procedure Laterality Date   ABDOMINAL HYSTERECTOMY  1963   partial, secondary to fibroids   APPENDECTOMY     Billroth     CARDIAC CATHETERIZATION  05/12/14   [facility]   CARDIOVERSION N/A 12/26/2016   Procedure: CARDIOVERSION;  Surgeon: AWellington Hampshire MD;  Location: [facility] ORS;  Service: Cardiovascular;  Laterality: N/A;   CARDIOVERSION N/A 05/16/2017   Procedure: CARDIOVERSION;  Surgeon: AWellington Hampshire MD;  Location: [facility] ORS;  Service: Cardiovascular;  Laterality: N/A;   CARDIOVERSION N/A 06/09/2017   Procedure: CARDIOVERSION;  Surgeon: AWellington Hampshire MD;  Location: [facility] ORS;  Service: Cardiovascular;  Laterality: N/A;   CARDIOVERSION N/A 11/30/2018   Procedure: CARDIOVERSION;  Surgeon: AWellington Hampshire MD;  Location: [facility] ORS;  Service: Cardiovascular;  Laterality: N/A;   CHOLECYSTECTOMY  1998   COLONOSCOPY  2009   RIGHT/LEFT HEART CATH AND CORONARY ANGIOGRAPHY Bilateral 08/21/2017   Procedure: RIGHT/LEFT HEART CATH AND CORONARY ANGIOGRAPHY;  Surgeon: AWellington Hampshire MD;  [facility];  Service: Cardiovascular;  Laterality: Bilateral;   TEE WITHOUT CARDIOVERSION N/A 08/25/2017   Procedure: TRANSESOPHAGEAL ECHOCARDIOGRAM (TEE);  Surgeon: BJolaine Artist MD;  [facility];  Service: Cardiovascular;  Laterality: N/A;   UPPER GI ENDOSCOPY  2004    Current Medications:  Current Meds  Medication Sig   allopurinol (ZYLOPRIM) 100 MG tablet TAKE 1/2 TABLET(50 MG) BY MOUTH DAILY   colchicine 0.6 MG tablet Take 0.6 mg by mouth daily as needed.   CYANOCOBALAMIN PO Take 500 mcg by mouth once a week.   diltiazem (CARDIZEM CD) 120 MG 24 hr capsule Take 1 capsule (120 mg total) by mouth daily.   dorzolamide (TRUSOPT) 2 % ophthalmic solution Place 1 drop into the left eye 2 (two) times daily.   fluticasone (FLONASE) 50 MCG/ACT  nasal spray Place 2 sprays into both nostrils daily as needed for allergies.    gabapentin (NEURONTIN) 300 MG capsule One capsule tid and two capsules q hs.   hydroxychloroquine (PLAQUENIL) 200 MG tablet Take 200 mg by mouth every other day.   ipratropium (ATROVENT) 0.03 % nasal spray SMARTSIG:2 Spray(s) Both Nares 3 Times Daily PRN   latanoprost (XALATAN) 0.005 % ophthalmic solution Place 1 drop into both eyes at bedtime.    levothyroxine (SYNTHROID) 112 MCG tablet TAKE 1 TABLET(112 MCG) BY MOUTH DAILY BEFORE BREAKFAST   metoprolol tartrate (LOPRESSOR) 100 MG tablet Take 1 tablet (100 mg total) by mouth 2 (two) times daily.   Multiple Vitamin (MULTI-VITAMIN) tablet Take 1 tablet by mouth at bedtime.    Probiotic Product (ALIGN) 4 MG CAPS Take one capsule q day   torsemide (DEMADEX) 20 MG tablet Take 1 tablet (20 mg total) by mouth every other day.   traMADol (ULTRAM) 50 MG tablet Take 1 tablet (50 mg total) by mouth 2 (two) times daily as needed (for pain.).   ursodiol (ACTIGALL) 300 MG capsule Take 300-600 mg by mouth See admin instructions. Take 2 capsules (600 mg) daily in the morning   & 1 capsule (300 mg) at night.   XARELTO 15 MG TABS tablet TAKE 1 TABLET(15 MG) BY MOUTH DAILY WITH SUPPER     Allergies:   Statins, Hydroxychloroquine, Haldol [haloperidol lactate], Librax [chlordiazepoxide-clidinium], Plavix [clopidogrel bisulfate], and Ramipril   Social History   Socioeconomic History   Marital status: Widowed    Spouse name: Not on file   Number of children: 2   Years of education: Not on file   Highest education level: Not on file  Occupational History   Occupation: retired  Tobacco Use   Smoking status: Never   Smokeless tobacco: Never  Vaping Use   Vaping Use: Never used  Substance and Sexual Activity   Alcohol use: No    Alcohol/week: 0.0 standard drinks of alcohol   Drug use: No   Sexual activity: Never  Other Topics Concern   Not on file  Social History Narrative    Not on file   Social Determinants of Health   Financial Resource Strain: Low Risk  (05/28/2021)   Overall Financial Resource Strain (CARDIA)    Difficulty of Paying Living Expenses: Not hard at all  Food Insecurity: No Food Insecurity (05/28/2021)   Hunger Vital Sign    Worried About Running Out of Food in the Last Year: Never true    [place] in the Last Year: Never true  Transportation Needs: No Transportation Needs (05/28/2021)   PRAPARE - Hydrologist (Medical): No    Lack of Transportation (Non-Medical): No  Physical Activity: Insufficiently Active (05/28/2021)   Exercise Vital Sign    Days of Exercise per Week: 3 days    Minutes of Exercise per Session: 40 min  Stress: No Stress Concern Present (05/28/2021)  [place] Questionnaire    Feeling of Stress : Not at all  Social Connections: Unknown (05/28/2021)   Social Connection and Isolation Panel [NHANES]    Frequency of Communication with Friends and Family: More than three times a week    Frequency of Social Gatherings with Friends and Family: More than three times a week    Attends Religious Services: Not on Advertising copywriter or Organizations: Not on file    Attends Archivist Meetings: Not on file    Marital Status: Not on file     Family History: The patient's family history includes Colon cancer in her daughter; Esophageal cancer in her brother; Ovarian cancer in her daughter; Stroke in her mother. There is no history of Breast cancer.  ROS:   Please see the history of present illness.    All other systems reviewed and are negative.  EKGs/Labs/Other Studies Reviewed:    The following studies were reviewed today:  07/31/2021 ECG      Recent Labs: 06/05/2021: ALT 19 07/30/2021: Hemoglobin 12.0; Platelets 192.0; TSH 4.15 08/10/2021: BUN 25; Creatinine, Ser 1.15; Potassium 5.0; Sodium 137  Recent Lipid  Panel    Component Value Date/Time   CHOL 162 06/05/2021 0959   TRIG 153.0 (H) 06/05/2021 0959   HDL 59.50 06/05/2021 0959   CHOLHDL 3 06/05/2021 0959   VLDL 30.6 06/05/2021 0959   LDLCALC 72 06/05/2021 0959   LDLDIRECT 143.5 12/17/2012 0955    Physical Exam:    VS:  BP 137/86   Pulse (!) 110   Ht '5\' 2"'$  (1.575 m)   Wt 133 lb (60.3 kg)   SpO2 96%   BMI 24.33 kg/m     Wt Readings from Last 3 Encounters:  09/05/21 133 lb (60.3 kg)  08/23/21 136 lb (61.7 kg)  08/15/21 133 lb 12.8 oz (60.7 kg)     GEN:  Well nourished, well developed in no acute distress.  Elderly HEENT: Normal NECK: No JVD; No carotid bruits LYMPHATICS: No lymphadenopathy CARDIAC: Irregularly irregular tachycardic, no murmurs, rubs, gallops RESPIRATORY:  Clear to auscultation without rales, wheezing or rhonchi  ABDOMEN: Soft, non-tender, non-distended MUSCULOSKELETAL:  No edema; No deformity  SKIN: Warm and dry NEUROLOGIC:  Alert and oriented x 3 PSYCHIATRIC:  Normal affect       ASSESSMENT:    1. Tachycardia-bradycardia syndrome ([place])   2. Chronic heart failure with preserved ejection fraction (HFpEF) ([place])   3. Longstanding persistent atrial fibrillation (HCC)    PLAN:    In order of problems listed above:  #Tachybradycardia syndrome Highly symptomatic with fatigue.  Surprisingly she had an episode of sinus bradycardia in May and her atrial fibrillation has widely variable ventricular rates.  I have recommended permanent pacing in an effort to help manage her variable heart rates.  Once pacemaker is implanted, would favor starting antiarrhythmics versus nodal blockers.  I discussed the permanent pacemaker procedure in detail with the patient including the risks and recovery and she wishes to proceed.  We will plan for an Abbott dual-chamber permanent pacemaker with left bundle area lead.  Risks, benefits, alternatives to PPM implantation were discussed in detail with the patient today. The  patient understands that the risks include but are not limited to bleeding, infection, pneumothorax, perforation, tamponade, vascular damage, renal failure, MI, stroke, death, and lead dislodgement and wishes to proceed.  We will therefore schedule device implantation at the next available time.  #  Persistent atrial fibrillation Stop Xarelto 3 days prior to the pacemaker implant.  Likely restart it 5 days after.  Symptomatic with widely variable ventricular rates.   Total time spent with patient today 60 minutes. This includes reviewing records, evaluating the patient and coordinating care.  Medication Adjustments/Labs and Tests Ordered: Current medicines are reviewed at length with the patient today.  Concerns regarding medicines are outlined above.  No orders of the defined types were placed in this encounter.  No orders of the defined types were placed in this encounter.    Signed, Hilton Cork. Quentin Ore, MD, [facility], [facility] 09/05/2021 1:55 PM    Electrophysiology [facility]

## 2021-09-14 ENCOUNTER — Inpatient Hospital Stay: Payer: Medicare Other | Attending: Oncology

## 2021-09-14 DIAGNOSIS — I110 Hypertensive heart disease with heart failure: Secondary | ICD-10-CM | POA: Diagnosis not present

## 2021-09-14 DIAGNOSIS — I5032 Chronic diastolic (congestive) heart failure: Secondary | ICD-10-CM | POA: Diagnosis not present

## 2021-09-14 DIAGNOSIS — Z888 Allergy status to other drugs, medicaments and biological substances status: Secondary | ICD-10-CM | POA: Insufficient documentation

## 2021-09-14 DIAGNOSIS — K754 Autoimmune hepatitis: Secondary | ICD-10-CM | POA: Insufficient documentation

## 2021-09-14 DIAGNOSIS — Z808 Family history of malignant neoplasm of other organs or systems: Secondary | ICD-10-CM | POA: Insufficient documentation

## 2021-09-14 DIAGNOSIS — E538 Deficiency of other specified B group vitamins: Secondary | ICD-10-CM | POA: Insufficient documentation

## 2021-09-14 DIAGNOSIS — E7800 Pure hypercholesterolemia, unspecified: Secondary | ICD-10-CM | POA: Insufficient documentation

## 2021-09-14 DIAGNOSIS — I252 Old myocardial infarction: Secondary | ICD-10-CM | POA: Insufficient documentation

## 2021-09-14 DIAGNOSIS — D500 Iron deficiency anemia secondary to blood loss (chronic): Secondary | ICD-10-CM

## 2021-09-14 DIAGNOSIS — Z823 Family history of stroke: Secondary | ICD-10-CM | POA: Diagnosis not present

## 2021-09-14 DIAGNOSIS — R040 Epistaxis: Secondary | ICD-10-CM | POA: Diagnosis not present

## 2021-09-14 DIAGNOSIS — Z79890 Hormone replacement therapy (postmenopausal): Secondary | ICD-10-CM | POA: Diagnosis not present

## 2021-09-14 DIAGNOSIS — E039 Hypothyroidism, unspecified: Secondary | ICD-10-CM | POA: Diagnosis not present

## 2021-09-14 DIAGNOSIS — Z801 Family history of malignant neoplasm of trachea, bronchus and lung: Secondary | ICD-10-CM | POA: Diagnosis not present

## 2021-09-14 DIAGNOSIS — Z9049 Acquired absence of other specified parts of digestive tract: Secondary | ICD-10-CM | POA: Insufficient documentation

## 2021-09-14 DIAGNOSIS — Z800 Family history of malignant neoplasm of digestive organs: Secondary | ICD-10-CM | POA: Insufficient documentation

## 2021-09-14 DIAGNOSIS — Z7901 Long term (current) use of anticoagulants: Secondary | ICD-10-CM | POA: Insufficient documentation

## 2021-09-14 DIAGNOSIS — D7589 Other specified diseases of blood and blood-forming organs: Secondary | ICD-10-CM | POA: Diagnosis not present

## 2021-09-14 DIAGNOSIS — D509 Iron deficiency anemia, unspecified: Secondary | ICD-10-CM | POA: Insufficient documentation

## 2021-09-14 DIAGNOSIS — Z79899 Other long term (current) drug therapy: Secondary | ICD-10-CM | POA: Insufficient documentation

## 2021-09-14 DIAGNOSIS — Z8711 Personal history of peptic ulcer disease: Secondary | ICD-10-CM | POA: Diagnosis not present

## 2021-09-14 LAB — CBC WITH DIFFERENTIAL/PLATELET
Abs Immature Granulocytes: 0.01 10*3/uL (ref 0.00–0.07)
Basophils Absolute: 0 10*3/uL (ref 0.0–0.1)
Basophils Relative: 0 %
Eosinophils Absolute: 0.2 10*3/uL (ref 0.0–0.5)
Eosinophils Relative: 4 %
HCT: 40.7 % (ref 36.0–46.0)
Hemoglobin: 12.9 g/dL (ref 12.0–15.0)
Immature Granulocytes: 0 %
Lymphocytes Relative: 14 %
Lymphs Abs: 0.8 10*3/uL (ref 0.7–4.0)
MCH: 33.3 pg (ref 26.0–34.0)
MCHC: 31.7 g/dL (ref 30.0–36.0)
MCV: 105.2 fL — ABNORMAL HIGH (ref 80.0–100.0)
Monocytes Absolute: 0.5 10*3/uL (ref 0.1–1.0)
Monocytes Relative: 10 %
Neutro Abs: 3.9 10*3/uL (ref 1.7–7.7)
Neutrophils Relative %: 72 %
Platelets: 181 10*3/uL (ref 150–400)
RBC: 3.87 MIL/uL (ref 3.87–5.11)
RDW: 14.4 % (ref 11.5–15.5)
WBC: 5.5 10*3/uL (ref 4.0–10.5)
nRBC: 0 % (ref 0.0–0.2)

## 2021-09-14 LAB — IRON AND TIBC
Iron: 69 ug/dL (ref 28–170)
Saturation Ratios: 16 % (ref 10.4–31.8)
TIBC: 438 ug/dL (ref 250–450)
UIBC: 369 ug/dL

## 2021-09-14 LAB — VITAMIN B12: Vitamin B-12: 837 pg/mL (ref 180–914)

## 2021-09-14 LAB — FERRITIN: Ferritin: 35 ng/mL (ref 11–307)

## 2021-09-17 ENCOUNTER — Inpatient Hospital Stay (HOSPITAL_BASED_OUTPATIENT_CLINIC_OR_DEPARTMENT_OTHER): Payer: Medicare Other | Admitting: Oncology

## 2021-09-17 ENCOUNTER — Inpatient Hospital Stay: Payer: Medicare Other

## 2021-09-17 ENCOUNTER — Encounter: Payer: Self-pay | Admitting: Oncology

## 2021-09-17 VITALS — BP 152/92 | HR 111 | Temp 97.9°F | Resp 18 | Wt 134.6 lb

## 2021-09-17 DIAGNOSIS — E538 Deficiency of other specified B group vitamins: Secondary | ICD-10-CM | POA: Diagnosis not present

## 2021-09-17 DIAGNOSIS — D500 Iron deficiency anemia secondary to blood loss (chronic): Secondary | ICD-10-CM | POA: Diagnosis not present

## 2021-09-17 DIAGNOSIS — I110 Hypertensive heart disease with heart failure: Secondary | ICD-10-CM | POA: Diagnosis not present

## 2021-09-17 DIAGNOSIS — D509 Iron deficiency anemia, unspecified: Secondary | ICD-10-CM | POA: Diagnosis not present

## 2021-09-17 DIAGNOSIS — E7800 Pure hypercholesterolemia, unspecified: Secondary | ICD-10-CM | POA: Diagnosis not present

## 2021-09-17 DIAGNOSIS — D7589 Other specified diseases of blood and blood-forming organs: Secondary | ICD-10-CM

## 2021-09-17 DIAGNOSIS — E039 Hypothyroidism, unspecified: Secondary | ICD-10-CM | POA: Diagnosis not present

## 2021-09-17 MED FILL — Ferumoxytol Inj 510 MG/17ML (30 MG/ML) (Elemental Fe): INTRAVENOUS | Qty: 17 | Status: AC

## 2021-09-17 NOTE — Progress Notes (Signed)
Pt here for follow up. No new concerns voiced.   

## 2021-09-17 NOTE — Progress Notes (Signed)
Hematology/Oncology Progress note [phone] [phone]      Patient Care Team: Einar Pheasant, MD as PCP - General (Unknown Physician Specialty) [name], MD as PCP - Cardiology (Cardiology) Bensimhon, Shaune Pascal, MD as PCP - Advanced Heart Failure (Cardiology) Vickie Epley, MD as PCP - Electrophysiology (Cardiology) Byrnett, Forest Gleason, MD (General Surgery) Earlie Server, MD as Consulting Physician (Oncology)  REFERRING PROVIDER: Einar Pheasant, MD CHIEF COMPLAINTS/REASON FOR VISIT:  Follow-up for iron deficiency anemia  HISTORY OF PRESENTING ILLNESS:  Ruth Roth is a  88 y.o.  female presents for follow-up of iron deficiency anemia. She has a history of iron deficiency anemia, cannot tolerate oral iron supplementation.  Previously tolerated IV Feraheme treatments. She takes Xarelto for A Fib. Has recurrent epistaxis. History of IDA, cannot tolerate oral iron supplements.   INTERVAL HISTORY Ruth Roth is a 88 y.o. female who has above history reviewed by me today presents for follow up visit for management of anemia She reports feeling well.  She takes vitamin B12 500 mcg once a week. She has no new concerns today.    Review of Systems  Constitutional:  Positive for fatigue. Negative for appetite change, chills and fever.  HENT:   Negative for hearing loss and voice change.   Eyes:  Negative for eye problems.  Respiratory:  Negative for chest tightness and cough.   Cardiovascular:  Negative for chest pain.  Gastrointestinal:  Negative for abdominal distention, abdominal pain and blood in stool.  Endocrine: Negative for hot flashes.  Genitourinary:  Negative for difficulty urinating and frequency.   Musculoskeletal:  Negative for arthralgias.  Skin:  Negative for itching and rash.  Neurological:  Negative for extremity weakness.  Hematological:  Negative for adenopathy.  Psychiatric/Behavioral:  Negative for confusion.     MEDICAL  HISTORY:  Past Medical History:  Diagnosis Date   (HFpEF) heart failure with preserved ejection fraction ([place])    a. 07/2017 Echo: EF 50-55%, no rwma, mild AS/AI, mod MR, mod dil LA, mod TR, PASP 55mHg; b. 03/2021 Echo: EF 60-65%. No rwma. Nl RV fxn. Sev BAE. Mild MR. Mild AI/AS.   Autoimmune hepatitis ([facility]    followed by Dr SGustavo Lah  Fibrocystic breast disease    Glaucoma    Hypercholesterolemia    Hypertension    Hypothyroidism    Inflammatory arthritis    MI (myocardial infarction) ([facility]    Mitral regurgitation    a. 07/2017 Echo: Mod MR; b. 08/2017 Cath: 4+/Severe MR; c. 08/2017 TEE: Mild MR; d. 03/2021 Echo: Mild MR.   Neuropathy    Non-obstructive Coronary artery disease    a. 05/2014 NSTEMI/Cath: LM nl, LAD mild dzs, LCX 60ost hazy (? culprit), RCA mild dzs. EF 60% by echo-->Med Rx; b. 08/2017 Cath: LM 30ost, LAD min irregs, LCX small, 40ost/p, RCA large, min irregs, RPDA/RPL1 nl, EF 50-55%. 4+MR.   Osteoarthritis    Permanent atrial fibrillation (HCC)    a. CHA2DS2VASc = 6-->Xarelto.   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    SURGICAL HISTORY: Past Surgical History:  Procedure Laterality Date   ABDOMINAL HYSTERECTOMY  1963   partial, secondary to fibroids   APPENDECTOMY     [place]     CARDIAC CATHETERIZATION  05/12/14   [place]  CARDIOVERSION N/A 12/26/2016   Procedure: CARDIOVERSION;  Surgeon: AWellington Hampshire MD;  Location: [facility] ORS;  Service: Cardiovascular;  Laterality: N/A;  CARDIOVERSION N/A 05/16/2017   Procedure: CARDIOVERSION;  Surgeon: [name], MD;  Location: [facility] ORS;  Service: Cardiovascular;  Laterality: N/A;   CARDIOVERSION N/A 06/09/2017   Procedure: CARDIOVERSION;  Surgeon: [name], MD;  Location: [facility] ORS;  Service: Cardiovascular;  Laterality: N/A;   CARDIOVERSION N/A 11/30/2018   Procedure: CARDIOVERSION;  Surgeon: [name], MD;  Location: [facility] ORS;  Service: Cardiovascular;   Laterality: N/A;   CHOLECYSTECTOMY  1998   COLONOSCOPY  2009   RIGHT/LEFT HEART CATH AND CORONARY ANGIOGRAPHY Bilateral 08/21/2017   Procedure: RIGHT/LEFT HEART CATH AND CORONARY ANGIOGRAPHY;  Surgeon: [name], MD;  Location: [place] [facility];  Service: Cardiovascular;  Laterality: Bilateral;   TEE WITHOUT CARDIOVERSION N/A 08/25/2017   Procedure: TRANSESOPHAGEAL ECHOCARDIOGRAM (TEE);  Surgeon: Jolaine Artist, MD;  Location: [facility] ENDOSCOPY;  Service: Cardiovascular;  Laterality: N/A;   UPPER GI ENDOSCOPY  2004    SOCIAL HISTORY: Social History   Socioeconomic History   Marital status: Widowed    Spouse name: Not on file   Number of children: 2   Years of education: Not on file   Highest education level: Not on file  Occupational History   Occupation: retired  Tobacco Use   Smoking status: Never   Smokeless tobacco: Never  Vaping Use   Vaping Use: Never used  Substance and Sexual Activity   Alcohol use: No    Alcohol/week: 0.0 standard drinks of alcohol   Drug use: No   Sexual activity: Never  Other Topics Concern   Not on file  Social History Narrative   Not on file   Social Determinants of Health   Financial Resource Strain: Low Risk  (05/28/2021)   Overall Financial Resource Strain (CARDIA)    Difficulty of Paying Living Expenses: Not hard at all  Food Insecurity: No Food Insecurity (05/28/2021)   Hunger Vital Sign    Worried About Running Out of Food in the Last Year: Never true    [place] in the Last Year: Never true  Transportation Needs: No Transportation Needs (05/28/2021)   PRAPARE - Hydrologist (Medical): No    Lack of Transportation (Non-Medical): No  Physical Activity: Insufficiently Active (05/28/2021)   Exercise Vital Sign    Days of Exercise per Week: 3 days    Minutes of Exercise per Session: 40 min  Stress: No Stress Concern Present (05/28/2021)   [place]    Feeling of Stress : Not at all  Social Connections: Unknown (05/28/2021)   Social Connection and Isolation Panel [NHANES]    Frequency of Communication with Friends and Family: More than three times a week    Frequency of Social Gatherings with Friends and Family: More than three times a week    Attends Religious Services: Not on file    Active Member of Clubs or Organizations: Not on file    Attends Archivist Meetings: Not on file    Marital Status: Not on file  Intimate Partner Violence: Not At Risk (05/28/2021)   Humiliation, Afraid, Rape, and Kick questionnaire    Fear of Current or Ex-Partner: No    Emotionally Abused: No    Physically Abused: No    Sexually Abused: No    FAMILY HISTORY: Family History  Problem Relation Age of Onset   Stroke Mother    Esophageal cancer Brother  also had lung cancer   Ovarian cancer Daughter    Colon cancer Daughter    Breast cancer Neg Hx     ALLERGIES:  is allergic to statins, hydroxychloroquine, haldol [haloperidol lactate], librax [chlordiazepoxide-clidinium], plavix [clopidogrel bisulfate], and ramipril.  MEDICATIONS:  Current Outpatient Medications  Medication Sig Dispense Refill   CYANOCOBALAMIN PO Take 500 mcg by mouth once a week.     diltiazem (CARDIZEM CD) 120 MG 24 hr capsule Take 1 capsule (120 mg total) by mouth daily. 90 capsule 3   dorzolamide (TRUSOPT) 2 % ophthalmic solution Place 1 drop into the left eye 2 (two) times daily.     gabapentin (NEURONTIN) 300 MG capsule One capsule tid and two capsules q hs. 150 capsule 2   hydroxychloroquine (PLAQUENIL) 200 MG tablet Take 200 mg by mouth every other day.     ipratropium (ATROVENT) 0.03 % nasal spray SMARTSIG:2 Spray(s) Both Nares 3 Times Daily PRN     latanoprost (XALATAN) 0.005 % ophthalmic solution Place 1 drop into both eyes at bedtime.      levothyroxine (SYNTHROID) 112 MCG tablet TAKE 1 TABLET(112 MCG) BY MOUTH DAILY  BEFORE BREAKFAST 90 tablet 2   metoprolol tartrate (LOPRESSOR) 100 MG tablet Take 1 tablet (100 mg total) by mouth 2 (two) times daily. 60 tablet 3   Multiple Vitamin (MULTI-VITAMIN) tablet Take 1 tablet by mouth at bedtime.      Probiotic Product (ALIGN) 4 MG CAPS Take one capsule q day 30 capsule 0   torsemide (DEMADEX) 20 MG tablet Take 1 tablet (20 mg total) by mouth every other day. 45 tablet 3   traMADol (ULTRAM) 50 MG tablet Take 1 tablet (50 mg total) by mouth 2 (two) times daily as needed (for pain.). 60 tablet 1   ursodiol (ACTIGALL) 300 MG capsule Take 300-600 mg by mouth See admin instructions. Take 2 capsules (600 mg) daily in the morning   & 1 capsule (300 mg) at night.     XARELTO 15 MG TABS tablet TAKE 1 TABLET(15 MG) BY MOUTH DAILY WITH SUPPER 90 tablet 1   allopurinol (ZYLOPRIM) 100 MG tablet TAKE 1/2 TABLET(50 MG) BY MOUTH DAILY (Patient not taking: Reported on 09/17/2021) 45 tablet 1   colchicine 0.6 MG tablet Take 0.6 mg by mouth daily as needed. (Patient not taking: Reported on 09/17/2021)     fluticasone (FLONASE) 50 MCG/ACT nasal spray Place 2 sprays into both nostrils daily as needed for allergies.  (Patient not taking: Reported on 09/17/2021)     No current facility-administered medications for this visit.     PHYSICAL EXAMINATION: ECOG PERFORMANCE STATUS: 1 - Symptomatic but completely ambulatory Vitals:   09/17/21 1312  BP: (!) 152/92  Pulse: (!) 111  Resp: 18  Temp: 97.9 F (36.6 C)   Filed Weights   09/17/21 1312  Weight: 134 lb 9.6 oz (61.1 kg)    Physical Exam Constitutional:      General: She is not in acute distress. HENT:     Head: Normocephalic and atraumatic.  Eyes:     General: No scleral icterus.    Pupils: Pupils are equal, round, and reactive to light.  Cardiovascular:     Rate and Rhythm: Normal rate and regular rhythm.     Heart sounds: Normal heart sounds.     Comments: Irregular rhythm.  Pulmonary:     Effort: Pulmonary effort is  normal. No respiratory distress.     Breath sounds: No wheezing.  Abdominal:  General: Bowel sounds are normal. There is no distension.     Palpations: Abdomen is soft. There is no mass.     Tenderness: There is no abdominal tenderness.  Musculoskeletal:        General: No deformity. Normal range of motion.     Cervical back: Normal range of motion and neck supple.  Skin:    General: Skin is warm and dry.     Findings: No erythema or rash.  Neurological:     Mental Status: She is alert and oriented to person, place, and time. Mental status is at baseline.     Cranial Nerves: No cranial nerve deficit.     Coordination: Coordination normal.  Psychiatric:        Mood and Affect: Mood normal.      LABORATORY DATA:  I have reviewed the data as listed Lab Results  Component Value Date   WBC 5.5 09/14/2021   HGB 12.9 09/14/2021   HCT 40.7 09/14/2021   MCV 105.2 (H) 09/14/2021   PLT 181 09/14/2021   Recent Labs    03/16/21 1311 06/05/21 0959 06/11/21 1443 07/30/21 1557 08/10/21 1050 09/05/21 1521  NA 137 139   < > 137 137 139  K 4.2 4.5   < > 4.8 5.0 4.8  CL 107 104   < > 105 106 108  CO2 22 23   < > '23 23 23  '$ GLUCOSE 86 94   < > 78 77 87  BUN 40* 57*   < > 49* 25* 39*  CREATININE 1.30* 1.78*   < > 1.83* 1.15 1.11*  CALCIUM 8.8* 9.6   < > 8.4 9.6 9.2  GFRNONAA 39*  --   --   --   --  47*  PROT  --  6.5  --   --   --   --   ALBUMIN  --  4.4  --   --   --   --   AST  --  16  --   --   --   --   ALT  --  19  --   --   --   --   ALKPHOS  --  115  --   --   --   --   BILITOT  --  0.4  --   --   --   --   BILIDIR  --  0.1  --   --   --   --    < > = values in this interval not displayed.    Iron/TIBC/Ferritin/ %Sat    Component Value Date/Time   IRON 69 09/14/2021 1352   TIBC 438 09/14/2021 1352   FERRITIN 35 09/14/2021 1352   IRONPCTSAT 16 09/14/2021 1352     RADIOGRAPHIC STUDIES: I have personally reviewed the radiological images as listed and agreed with  the findings in the report. 06/17/2018 Ultrasound abdomen complete Heterogeneous echotexture in the liver.  Extrahepatic and intrahepatic.  Duct dilation, stable since prior study. Small hyperechoic area within the spleen the largest 7 mm.  Compatible with a hemangioma.  Stable Prior cholecystectomy. Mildly increased texture in the kidney suggest chronic kidney disease.  ASSESSMENT & PLAN:  1. Iron deficiency anemia due to chronic blood loss   2. Macrocytosis   3. Vitamin B 12 deficiency    #Iron deficiency anemia Labs reviewed and discussed with patient. Patient has normal hemoglobin.  Adequate iron panel.  No need for IV iron  treatments  #History of vitamin B12 deficiency., B12 is adequate. Continue oral vitamin B12-OTC supply-500 MCG weekly.  #Macrocytosis, MCV 105.  Probably secondary to medication side effects, cannot rule out MDS.  Given patient's advanced age and other medical conditions.  I recommend observation.  #History of autoimmune hepatitis/PBC, continue follow-up with gastroenterology.  Orders Placed This Encounter  Procedures   CBC with Differential/Platelet    Standing Status:   Future    Standing Expiration Date:   09/18/2022   Comprehensive metabolic panel    Standing Status:   Future    Standing Expiration Date:   09/18/2022   Iron and TIBC    Standing Status:   Future    Standing Expiration Date:   09/18/2022   Folate    Standing Status:   Future    Standing Expiration Date:   09/18/2022   Ferritin    Standing Status:   Future    Standing Expiration Date:   09/18/2022   Vitamin B12    Standing Status:   Future    Standing Expiration Date:   09/18/2022   Lactate dehydrogenase    Standing Status:   Future    Standing Expiration Date:   09/18/2022    All questions were answered. The patient knows to call the clinic with any problems questions or concerns. We spent sufficient time to discuss many aspect of care, questions were answered to patient's  satisfaction.   Return of visit: 12 months   Earlie Server, MD, PhD 09/17/2021

## 2021-09-21 DIAGNOSIS — H401132 Primary open-angle glaucoma, bilateral, moderate stage: Secondary | ICD-10-CM | POA: Diagnosis not present

## 2021-09-27 DIAGNOSIS — E039 Hypothyroidism, unspecified: Secondary | ICD-10-CM | POA: Diagnosis not present

## 2021-09-27 NOTE — Pre-Procedure Instructions (Signed)
Attempted to call patient regarding procedure instructions for tomorrow.  Left voice mail on on the following items: Changed Arrival time to 1130, instead of 1:30 Nothing to eat or drink after midnight Ok to take medication AM of procedure Responsible person to drive you home and stay with you for 24 hrs Wash with special soap night before and morning of procedure  Also spoke to her son DEMANI WEYRAUCH, he is the one bringing her to the hospital.  Informed him of the time change.

## 2021-09-28 ENCOUNTER — Ambulatory Visit (HOSPITAL_COMMUNITY): Payer: Medicare Other

## 2021-09-28 ENCOUNTER — Ambulatory Visit (HOSPITAL_COMMUNITY)
Admission: RE | Admit: 2021-09-28 | Discharge: 2021-09-28 | Disposition: A | Discharge status: Home / Self Care | Payer: Medicare Other | Attending: Cardiology | Admitting: Cardiology

## 2021-09-28 ENCOUNTER — Encounter (HOSPITAL_COMMUNITY)
Admission: RE | Disposition: A | Discharge status: Home / Self Care | Payer: Self-pay | Source: Home / Self Care | Attending: Cardiology

## 2021-09-28 ENCOUNTER — Other Ambulatory Visit: Payer: Self-pay

## 2021-09-28 DIAGNOSIS — I4811 Longstanding persistent atrial fibrillation: Secondary | ICD-10-CM | POA: Insufficient documentation

## 2021-09-28 DIAGNOSIS — I110 Hypertensive heart disease with heart failure: Secondary | ICD-10-CM | POA: Insufficient documentation

## 2021-09-28 DIAGNOSIS — I4819 Other persistent atrial fibrillation: Secondary | ICD-10-CM | POA: Diagnosis not present

## 2021-09-28 DIAGNOSIS — E039 Hypothyroidism, unspecified: Secondary | ICD-10-CM | POA: Diagnosis not present

## 2021-09-28 DIAGNOSIS — I495 Sick sinus syndrome: Secondary | ICD-10-CM | POA: Insufficient documentation

## 2021-09-28 DIAGNOSIS — I5032 Chronic diastolic (congestive) heart failure: Secondary | ICD-10-CM | POA: Diagnosis not present

## 2021-09-28 DIAGNOSIS — Z7901 Long term (current) use of anticoagulants: Secondary | ICD-10-CM | POA: Diagnosis not present

## 2021-09-28 DIAGNOSIS — Z950 Presence of cardiac pacemaker: Secondary | ICD-10-CM | POA: Diagnosis not present

## 2021-09-28 DIAGNOSIS — I340 Nonrheumatic mitral (valve) insufficiency: Secondary | ICD-10-CM | POA: Insufficient documentation

## 2021-09-28 HISTORY — PX: PACEMAKER IMPLANT: EP1218

## 2021-09-28 SURGERY — PACEMAKER IMPLANT
Anesthesia: LOCAL

## 2021-09-28 MED ORDER — DILTIAZEM HCL ER COATED BEADS 240 MG PO CP24: 240.0000 mg | ORAL_CAPSULE | Freq: Every day | ORAL | 3 refills | Status: DC

## 2021-09-28 MED ORDER — FENTANYL CITRATE (PF) 100 MCG/2ML IJ SOLN
INTRAMUSCULAR | Status: DC | PRN
  Administered 2021-09-28: 25 ug via INTRAVENOUS

## 2021-09-28 MED ORDER — CHLORHEXIDINE GLUCONATE 4 % EX LIQD
4.0000 | Freq: Once | CUTANEOUS | Status: DC
  Filled 2021-09-28: qty 60

## 2021-09-28 MED ORDER — SODIUM CHLORIDE 0.9 % IV SOLN: INTRAVENOUS | Status: DC

## 2021-09-28 MED ORDER — SODIUM CHLORIDE 0.9 % IV SOLN
INTRAVENOUS | Status: AC
  Filled 2021-09-28: qty 2

## 2021-09-28 MED ORDER — LIDOCAINE HCL 1 % IJ SOLN
INTRAMUSCULAR | Status: AC
  Filled 2021-09-28: qty 60

## 2021-09-28 MED ORDER — SODIUM CHLORIDE 0.9 % IV SOLN
80.0000 mg | INTRAVENOUS | Status: AC
  Administered 2021-09-28: 80 mg

## 2021-09-28 MED ORDER — ONDANSETRON HCL 4 MG/2ML IJ SOLN: 4.0000 mg | Freq: Four times a day (QID) | INTRAMUSCULAR | Status: DC | PRN

## 2021-09-28 MED ORDER — HEPARIN (PORCINE) IN NACL 1000-0.9 UT/500ML-% IV SOLN
INTRAVENOUS | Status: AC
  Filled 2021-09-28: qty 500

## 2021-09-28 MED ORDER — ACETAMINOPHEN 325 MG PO TABS: 325.0000 mg | ORAL_TABLET | ORAL | Status: DC | PRN

## 2021-09-28 MED ORDER — HEPARIN (PORCINE) IN NACL 1000-0.9 UT/500ML-% IV SOLN
INTRAVENOUS | Status: DC | PRN
  Administered 2021-09-28: 500 mL

## 2021-09-28 MED ORDER — MIDAZOLAM HCL 5 MG/5ML IJ SOLN
INTRAMUSCULAR | Status: DC | PRN
  Administered 2021-09-28: 1 mg via INTRAVENOUS

## 2021-09-28 MED ORDER — POVIDONE-IODINE 10 % EX SWAB: 2.0000 | Freq: Once | CUTANEOUS | Status: DC

## 2021-09-28 MED ORDER — LIDOCAINE HCL (PF) 1 % IJ SOLN
INTRAMUSCULAR | Status: DC | PRN
  Administered 2021-09-28: 60 mL

## 2021-09-28 MED ORDER — MIDAZOLAM HCL 5 MG/5ML IJ SOLN
INTRAMUSCULAR | Status: AC
  Filled 2021-09-28: qty 5

## 2021-09-28 MED ORDER — CEFAZOLIN SODIUM-DEXTROSE 2-4 GM/100ML-% IV SOLN
2.0000 g | INTRAVENOUS | Status: AC
  Administered 2021-09-28: 2 g via INTRAVENOUS

## 2021-09-28 MED ORDER — FENTANYL CITRATE (PF) 100 MCG/2ML IJ SOLN
INTRAMUSCULAR | Status: AC
  Filled 2021-09-28: qty 2

## 2021-09-28 MED ORDER — CEFAZOLIN SODIUM-DEXTROSE 2-4 GM/100ML-% IV SOLN
INTRAVENOUS | Status: AC
  Filled 2021-09-28: qty 100

## 2021-09-28 SURGICAL SUPPLY — 14 items
CABLE SURGICAL S-101-97-12 (CABLE) ×2 IMPLANT
CATH CPS LOCATOR 3D SM (CATHETERS) ×1 IMPLANT
HELIX LOCKING TOOL (MISCELLANEOUS) ×2 IMPLANT
LEAD TENDRIL MRI 46CM LPA1200M (Lead) ×1 IMPLANT
LEAD TENDRIL SDX 2088TC-58CM (Lead) ×1 IMPLANT
PACEMAKER ASSURITY DR-RF (Pacemaker) ×1 IMPLANT
PAD DEFIB RADIO PHYSIO CONN (PAD) ×2 IMPLANT
SHEATH 8FR PRELUDE SNAP 13 (SHEATH) ×1 IMPLANT
SHEATH 9FR PRELUDE SNAP 13 (SHEATH) ×1 IMPLANT
SHEATH PROBE COVER 6X72 (BAG) ×1 IMPLANT
SLITTER AGILIS HISPRO (INSTRUMENTS) ×1 IMPLANT
TOOL HELIX LOCKING (MISCELLANEOUS) IMPLANT
TRAY PACEMAKER INSERTION (PACKS) ×2 IMPLANT
WIRE HI TORQ VERSACORE-J 145CM (WIRE) ×1 IMPLANT

## 2021-09-28 NOTE — Interval H&P Note (Signed)
History and Physical Interval Note:  09/28/2021 1:06 PM  Ruth Roth  has presented today for surgery, with the diagnosis of bradycardia.  The various methods of treatment have been discussed with the patient and family. After consideration of risks, benefits and other options for treatment, the patient has consented to  Procedure(s): PACEMAKER IMPLANT (N/A) as a surgical intervention.  The patient's history has been reviewed, patient examined, no change in status, stable for surgery.  I have reviewed the patient's chart and labs.  Questions were answered to the patient's satisfaction.     Juniel Groene T Bryson Palen

## 2021-09-28 NOTE — Progress Notes (Signed)
Pt assisted with dressing-UE shoulder immobilizer secured

## 2021-09-28 NOTE — Discharge Instructions (Addendum)
Supplemental Discharge Instructions for  Pacemaker/Defibrillator Patients  Tomorrow, 09/29/21, send in a device transmission  Activity No heavy lifting or vigorous activity with your left/right arm for 6 to 8 weeks.  Do not raise your left/right arm above your head for one week.  Gradually raise your affected arm as drawn below.             10/03/21                     10/04/21                   10/05/21                  10/06/21 __  NO DRIVING (if you drive), for  1 week ; you may begin driving on   0/10/07  .  WOUND CARE Keep the wound area clean and dry.  Do not get this area wet , no showers for one week; you may shower on  10/06/21  . Tomorrow, 09/29/21, remove the arm sling Tomorrow, 09/29/21 remove the outer plastic bandage.  Underneath the plastic bandage there are steri strips (paper tapes), DO NOT remove these. The tape/steri-strips on your wound will fall off; do not pull them off.  No bandage is needed on the site.  DO  NOT apply any creams, oils, or ointments to the wound area. If you notice any drainage or discharge from the wound, any swelling or bruising at the site, or you develop a fever > 101? F after you are discharged home, call the office at once.  Special Instructions You are still able to use cellular telephones; use the ear opposite the side where you have your pacemaker/defibrillator.  Avoid carrying your cellular phone near your device. When traveling through airports, show security personnel your identification card to avoid being screened in the metal detectors.  Ask the security personnel to use the hand wand. Avoid arc welding equipment, MRI testing (magnetic resonance imaging), TENS units (transcutaneous nerve stimulators).  Call the office for questions about other devices. Avoid electrical appliances that are in poor condition or are not properly grounded. Microwave ovens are safe to be near or to operate.  After Your Pacemaker   You have a [employer]  Pacemaker  ACTIVITY Do not lift your arm above shoulder height for 1 week after your procedure. After 7 days, you may progress as below.  You should remove your sling 24 hours after your procedure, unless otherwise instructed by your provider.     Friday October 05, 2021  Saturday October 06, 2021 Sunday October 07, 2021 Monday October 08, 2021   Do not lift, push, pull, or carry anything over 10 pounds with the affected arm until 6 weeks (Friday November 09, 2021 ) after your procedure.   You may drive AFTER your wound check, unless you have been told otherwise by your provider.   Ask your healthcare provider when you can go back to work   INCISION/Dressing If you are on a blood thinner such as Coumadin, Xarelto, Eliquis, Plavix, or Pradaxa please confirm with your provider when this should be resumed.   If large square, outer bandage is left in place, this can be removed after 24 hours from your procedure. Do not remove steri-strips or glue as below.   Monitor your Pacemaker site for redness, swelling, and drainage. Call the device clinic at [phone] if you experience these symptoms or fever/chills.  If  your incision is sealed with Steri-strips or staples, you may shower 7 days after your procedure or when told by your provider. Do not remove the steri-strips or let the shower hit directly on your site. You may wash around your site with soap and water.    If you were discharged in a sling, please do not wear this during the day more than 48 hours after your surgery unless otherwise instructed. This may increase the risk of stiffness and soreness in your shoulder.   Avoid lotions, ointments, or perfumes over your incision until it is well-healed.  You may use a hot tub or a pool AFTER your wound check appointment if the incision is completely closed.  Pacemaker Alerts:  Some alerts are vibratory and others beep. These are NOT emergencies. Please call our office to let us know. If this occurs  at night or on weekends, it can wait until the next business day. Send a remote transmission.  If your device is capable of reading fluid status (for heart failure), you will be offered monthly monitoring to review this with you.   DEVICE MANAGEMENT Remote monitoring is used to monitor your pacemaker from home. This monitoring is scheduled every 91 days by our office. It allows [place] to keep an eye on the functioning of your device to ensure it is working properly. You will routinely see your Electrophysiologist annually (more often if necessary).   You should receive your ID card for your new device in 4-8 weeks. Keep this card with you at all times once received. Consider wearing a medical alert bracelet or necklace.  Your Pacemaker may be MRI compatible. This will be discussed at your next office visit/wound check.  You should avoid contact with strong electric or magnetic fields.   Do not use amateur (ham) radio equipment or electric (arc) welding torches. MP3 player headphones with magnets should not be used. Some devices are safe to use if held at least 12 inches (30 cm) from your Pacemaker. These include power tools, lawn mowers, and speakers. If you are unsure if something is safe to use, ask your health care provider.  When using your cell phone, hold it to the ear that is on the opposite side from the Pacemaker. Do not leave your cell phone in a pocket over the Pacemaker.  You may safely use electric blankets, heating pads, computers, and microwave ovens.  Call the office right away if: You have chest pain. You feel more short of breath than you have felt before. You feel more light-headed than you have felt before. Your incision starts to open up.  This information is not intended to replace advice given to you by your health care provider. Make sure you discuss any questions you have with your health care provider.

## 2021-09-28 NOTE — Progress Notes (Signed)
Dr Quentin Ore notified of post CXR and cont'd ST-advised to proceed with d/c

## 2021-10-01 ENCOUNTER — Encounter (HOSPITAL_COMMUNITY): Payer: Self-pay | Admitting: Cardiology

## 2021-10-01 ENCOUNTER — Telehealth: Payer: Self-pay

## 2021-10-01 ENCOUNTER — Other Ambulatory Visit: Payer: Self-pay | Admitting: Internal Medicine

## 2021-10-01 NOTE — Telephone Encounter (Signed)
-----   Message from [place], [place] sent at 09/28/2021  5:09 PM EDT ----- Same day d/c  [facility] PPM [facility]

## 2021-10-01 NOTE — Telephone Encounter (Signed)
Follow-up after same day discharge: Implant date: 09/28/21 MD: Lars Mage, MD Device: Abbott PPM Location: Left Chest   Wound check visit: 10/10/21 2:40 90 day MD follow-up: 12/12/21 1:20  Remote Transmission received: assisted patient/son with sending manual transmission  Dressing/sling removed: yes  Confirm OAC restart on: 10/03/21   Successful telephone encounter to patient to follow up post PPM implant. Patient states she is doing well. Denies bleeding or swelling at site. Minimal soreness. Advised to continue to refrain from showering and continue to adhere to arm restrictions. Patient and son provided device clinic contact at [phone]. Appointments confirmed as above.

## 2021-10-02 ENCOUNTER — Telehealth: Payer: Self-pay

## 2021-10-02 NOTE — Telephone Encounter (Signed)
We will adjust when she comes in for wound clinic check 10/10/21.

## 2021-10-02 NOTE — Telephone Encounter (Signed)
Merlin alert for "exceeded AT/AF burden". Presenting rhythm AFlutter with Vs/Vp (known). Hx of tachy/brady (AFib) with [place] to restart 10/03/21. Op note reveals patient to be in AFib at implant. Rate histogram with rates 120-140bpm approx 75% of the time. See Routing for further review of rates. See presenting rhythm of activation encounter 10/01/21.   RN who completed same day d/c yesterday 10/03/21, spoke to patient and patient had no complaints. See note from hospital admission that patient was on nodal blockers which resulted in symptomatic bradycardia with rates in 30's. Routing to Dr. Quentin Ore for review.

## 2021-10-03 ENCOUNTER — Encounter (HOSPITAL_COMMUNITY): Payer: Self-pay | Admitting: Internal Medicine

## 2021-10-03 ENCOUNTER — Ambulatory Visit (HOSPITAL_COMMUNITY)
Admission: RE | Admit: 2021-10-03 | Discharge: 2021-10-03 | Disposition: A | Discharge status: Home / Self Care | Payer: Medicare Other | Source: Ambulatory Visit | Attending: Internal Medicine | Admitting: Internal Medicine

## 2021-10-03 VITALS — BP 112/70 | HR 69 | Wt 132.6 lb

## 2021-10-03 DIAGNOSIS — N1832 Chronic kidney disease, stage 3b: Secondary | ICD-10-CM

## 2021-10-03 DIAGNOSIS — Z8711 Personal history of peptic ulcer disease: Secondary | ICD-10-CM | POA: Diagnosis not present

## 2021-10-03 DIAGNOSIS — I495 Sick sinus syndrome: Secondary | ICD-10-CM | POA: Diagnosis not present

## 2021-10-03 DIAGNOSIS — I2510 Atherosclerotic heart disease of native coronary artery without angina pectoris: Secondary | ICD-10-CM | POA: Insufficient documentation

## 2021-10-03 DIAGNOSIS — Z79899 Other long term (current) drug therapy: Secondary | ICD-10-CM | POA: Insufficient documentation

## 2021-10-03 DIAGNOSIS — I5032 Chronic diastolic (congestive) heart failure: Secondary | ICD-10-CM | POA: Diagnosis not present

## 2021-10-03 DIAGNOSIS — N184 Chronic kidney disease, stage 4 (severe): Secondary | ICD-10-CM | POA: Diagnosis not present

## 2021-10-03 DIAGNOSIS — I130 Hypertensive heart and chronic kidney disease with heart failure and stage 1 through stage 4 chronic kidney disease, or unspecified chronic kidney disease: Secondary | ICD-10-CM | POA: Diagnosis not present

## 2021-10-03 DIAGNOSIS — Z7901 Long term (current) use of anticoagulants: Secondary | ICD-10-CM | POA: Insufficient documentation

## 2021-10-03 DIAGNOSIS — Z888 Allergy status to other drugs, medicaments and biological substances status: Secondary | ICD-10-CM | POA: Insufficient documentation

## 2021-10-03 DIAGNOSIS — I340 Nonrheumatic mitral (valve) insufficiency: Secondary | ICD-10-CM | POA: Diagnosis not present

## 2021-10-03 DIAGNOSIS — Z950 Presence of cardiac pacemaker: Secondary | ICD-10-CM | POA: Diagnosis not present

## 2021-10-03 DIAGNOSIS — I482 Chronic atrial fibrillation, unspecified: Secondary | ICD-10-CM | POA: Insufficient documentation

## 2021-10-03 NOTE — Patient Instructions (Signed)
Your physician recommends that you schedule a follow-up appointment in: 6 months with an echocardiogram, **PLEASE CALL OUR OFFICE IN NOVEMBER TO SCHEDULE THIS APPOINTMENT

## 2021-10-03 NOTE — Progress Notes (Signed)
Advanced Heart Failure Clinic Note   Date:  10/03/2021   ID:  Ruth Roth, DOB 03-Apr-1928, MRN [id number]  Location: Home  Provider location: [facility] Type of Visit: Established patient  PCP:  Einar Pheasant, MD  Cardiologist:  [name], MD Primary HF: [place] [facility]: Dr. Holley Raring  Chief Complaint: Heart Failure follow-up   History of Present Illness:  Ruth Roth is a 88 y.o. female with a history of CAD, chronic diastolic HF with severe mitral reguritationm, chronic atrial fibrillation, hypertension, peptic ulcer disease.   Admitted [facility] 08/21/17 s/p R/LHC which showed significantly elevated filling pressures, severe MR, and low cardiac output, and then transferred to [facility] 6/14 for consideration of MitraClip.  AKI noted on admission felt to be due to cardiorenal syndrome. She required milrinone and lasix for diuresis. Diuresed 25 lbs and transitioned to 20 mg torsemide daily. Case and images reviewed at length with structural heart team. MR felt to be truly functional with improvement from hemodynamic optimization. Not felt to be MitraClip candidate. Discharge weight: 121 lbs.    In 2020 had recurrent AF with RVR Amio 200 bid started. Underwent DC-CV on 9/210/20. PCP cut back torsemide to 20 qOD due to AKI but developed volume overload. Torsemide increased back to 20 daily. Developed recurrent AF and Amio stopped. Saw Dr. Fletcher Anon in 04/30/19 and metoprolol increased to 75 bid for better rate control. Says she felt better on the 50 but is tolerating this ok.  Echo 12/21 EF 55-60% Moderate RV HK. Personally reviewed  Echo 03/16/21 EF 60-65% RV normal. Mild MR/AI Personally reviewed  Here with family for f/u. 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 Diltiazem increased to 240. Off Xarelto until tomorrow. Feels good. Denies SOB, orthopnea or PND. PM site ok   Cardiac Studies: Echo 10/09/17: EF 55-60%, no AI, mild to mod MR, LA  moderately dilated, RA mildly dilated, RV normal, PA peak pressure 52 mmHg   [facility] 08/21/2017 - Mild non-obstructive CAD RHC Procedural Findings: (Obtained from Scan 08/21/17 at 1030) Hemodynamics (mmHg) RA mean 17 RV 68/11 (19) PA 62/27 (44) PCWP 41  AO 147/67 (102) Cardiac Output (Fick) 2.45 Cardiac Index  (Fick) 1.46 PVR 1.23 WU   Echo 08/01/17 LVEF 50-55%, Mild AS, Mod MR, Mod LAE, Mild RAE, Mod TR, PA peak pressure 65 mm Hg. MR ++ calcified, and RV mild/moderately reduced function.       Past Medical History:  Diagnosis Date   (HFpEF) heart failure with preserved ejection fraction ([place])    a. 07/2017 Echo: EF 50-55%, no rwma, mild AS/AI, mod MR, mod dil LA, mod TR, PASP 32mHg; b. 03/2021 Echo: EF 60-65%. No rwma. Nl RV fxn. Sev BAE. Mild MR. Mild AI/AS.   [place]immune hepatitis ([place]    followed by Dr SGustavo Lah  Fibrocystic breast disease    Glaucoma    Hypercholesterolemia    Hypertension    Hypothyroidism    Inflammatory arthritis    MI (myocardial infarction) ([place]    Mitral regurgitation    a. 07/2017 Echo: Mod MR; b. 08/2017 Cath: 4+/Severe MR; c. 08/2017 TEE: Mild MR; d. 03/2021 Echo: Mild MR.   Neuropathy    Non-obstructive Coronary artery disease    a. 05/2014 NSTEMI/Cath: LM nl, LAD mild dzs, LCX 60ost hazy (? culprit), RCA mild dzs. EF 60% by echo-->Med Rx; b. 08/2017 Cath: LM 30ost, LAD min irregs, LCX small, 40ost/p, RCA large, min irregs, RPDA/RPL1 nl, EF  50-55%. 4+MR.   Osteoarthritis    Permanent atrial fibrillation (HCC)    a. CHA2DS2VASc = 6-->Xarelto.   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   Past Surgical History:  Procedure Laterality Date   ABDOMINAL HYSTERECTOMY  1963   partial, secondary to fibroids   APPENDECTOMY     Billroth     CARDIAC CATHETERIZATION  05/12/14   [facility]   CARDIOVERSION N/A 12/26/2016   Procedure: CARDIOVERSION;  Surgeon: [name], MD;  Location: [facility] ORS;  Service:  Cardiovascular;  Laterality: N/A;   CARDIOVERSION N/A 05/16/2017   Procedure: CARDIOVERSION;  Surgeon: [name], MD;  Location: [facility] ORS;  Service: Cardiovascular;  Laterality: N/A;   CARDIOVERSION N/A 06/09/2017   Procedure: CARDIOVERSION;  Surgeon: [name], MD;  Location: [facility] ORS;  Service: Cardiovascular;  Laterality: N/A;   CARDIOVERSION N/A 11/30/2018   Procedure: CARDIOVERSION;  Surgeon: [name], MD;  Location: [facility] ORS;  Service: Cardiovascular;  Laterality: N/A;   [place]  2009   PACEMAKER IMPLANT N/A 09/28/2021   Procedure: PACEMAKER IMPLANT;  Surgeon: Vickie Epley, MD;  Location: [facility];  Service: Cardiovascular;  Laterality: N/A;   RIGHT/LEFT HEART CATH AND CORONARY ANGIOGRAPHY Bilateral 08/21/2017   Procedure: RIGHT/LEFT HEART CATH AND CORONARY ANGIOGRAPHY;  Surgeon: [name], MD;  Location: [facility];  Service: Cardiovascular;  Laterality: Bilateral;   TEE WITHOUT CARDIOVERSION N/A 08/25/2017   Procedure: TRANSESOPHAGEAL ECHOCARDIOGRAM (TEE);  Surgeon: Jolaine Artist, MD;  Location: [facility] ENDOSCOPY;  Service: Cardiovascular;  Laterality: N/A;   UPPER GI ENDOSCOPY  2004     Current Outpatient Medications  Medication Sig Dispense Refill   allopurinol (ZYLOPRIM) 100 MG tablet TAKE 1/2 TABLET(50 MG) BY MOUTH DAILY 45 tablet 1   dorzolamide (TRUSOPT) 2 % ophthalmic solution Place 1 drop into the left eye 2 (two) times daily.     fluticasone (FLONASE) 50 MCG/ACT nasal spray Place 2 sprays into both nostrils daily as needed for allergies.     gabapentin (NEURONTIN) 300 MG capsule One capsule tid and two capsules q hs. 150 capsule 2   hydroxychloroquine (PLAQUENIL) 200 MG tablet Take 200 mg by mouth every other day.     ipratropium (ATROVENT) 0.03 % nasal spray Place 2 sprays into both nostrils 3 (three) times daily as needed for rhinitis.     latanoprost (XALATAN) 0.005 % ophthalmic solution  Place 1 drop into both eyes at bedtime.      levothyroxine (SYNTHROID) 112 MCG tablet TAKE 1 TABLET(112 MCG) BY MOUTH DAILY BEFORE BREAKFAST 90 tablet 2   metoprolol tartrate (LOPRESSOR) 100 MG tablet Take 1 tablet (100 mg total) by mouth 2 (two) times daily. 60 tablet 3   Multiple Vitamin (MULTI-VITAMIN) tablet Take 1 tablet by mouth at bedtime.      Probiotic Product (ALIGN) 4 MG CAPS Take one capsule q day 30 capsule 0   torsemide (DEMADEX) 20 MG tablet Take 1 tablet (20 mg total) by mouth every other day. 45 tablet 3   traMADol (ULTRAM) 50 MG tablet Take 1 tablet (50 mg total) by mouth 2 (two) times daily as needed (for pain.). 60 tablet 1   ursodiol (ACTIGALL) 300 MG capsule Take 300-600 mg by mouth See admin instructions. Take 2 capsules (600 mg) daily in the morning   & 1 capsule (300 mg) at night.     vitamin B-12 (CYANOCOBALAMIN)  500 MCG tablet Take 500 mcg by mouth every Monday.     XARELTO 15 MG TABS tablet TAKE 1 TABLET(15 MG) BY MOUTH DAILY WITH SUPPER 90 tablet 1   diltiazem (CARDIZEM CD) 240 MG 24 hr capsule Take 1 capsule (240 mg total) by mouth daily. 90 capsule 3   No current facility-administered medications for this encounter.    Allergies:   Statins, Hydroxychloroquine, Haldol [haloperidol lactate], Librax [chlordiazepoxide-clidinium], Plavix [clopidogrel bisulfate], and Ramipril   Social History:  The patient  reports that she has never smoked. She has never used smokeless tobacco. She reports that she does not drink alcohol and does not use drugs.   Family History:  The patient's family history includes Colon cancer in her daughter; Esophageal cancer in her brother; Ovarian cancer in her daughter; Stroke in her mother.   ROS:  Please see the history of present illness.   All other systems are personally reviewed and negative.   Vitals:   10/03/21 1414  BP: 112/70  Pulse: 69  SpO2: 96%  Weight: 60.1 kg (132 lb 9.6 oz)     Exam:   General:  Elderly No resp  difficulty HEENT: normal Neck: supple. no JVD. Carotids 2+ bilat; no bruits. No lymphadenopathy or thryomegaly appreciated. Cor: PMI nondisplaced. Regular rate & rhythm. 2/6 MR. PM site mild bruising, otherwise ok  Lungs: clear Abdomen: soft, nontender, nondistended. No hepatosplenomegaly. No bruits or masses. Good bowel sounds. Extremities: no cyanosis, clubbing, rash, edema Neuro: alert & orientedx3, cranial nerves grossly intact. moves all 4 extremities w/o difficulty. Affect pleasant   Recent Labs: 06/05/2021: ALT 19 07/30/2021: TSH 4.15 09/05/2021: BUN 39; Creatinine, Ser 1.11; Potassium 4.8; Sodium 139 09/14/2021: Hemoglobin 12.9; Platelets 181  Personally reviewed   Wt Readings from Last 3 Encounters:  10/03/21 60.1 kg (132 lb 9.6 oz)  09/28/21 59.9 kg (132 lb)  09/17/21 61.1 kg (134 lb 9.6 oz)     ASSESSMENT AND PLAN:  1.  Chronic diastolic congestive heart failure with h/o low output:  - Echo 08/01/17 LVEF 50-55%, Mild AS, Mod MR, Mod LAE, Mild RAE, Mod TR, RV moderately HK PA peak pressure 65 mm Hg. - Cath 08/21/17 with signficantly elevated filling pressures, severe MR, and low cardiac output. Had cardiorenal syndrome on admit with severe MR and RV failure and required milrinone - Echo 10/09/17: EF 55-60%, mild to mod MR - Echo 12/20 EF 60-65% mod MR  - Echo 12/21 EF 55-60% Moderate RV HK.  Moderate MR  - Echo 03/16/21 EF 60-65% RV normal. Mild MR/AI Personally reviewed - Stable NYHA II. Volume status looks good Continue current regimen of torsemide 40 daily alternating with 20 daily. - Recent labs ok   2.  Severe mitral regurgitation:  - Echo 08/01/17 LVEF 50-55%, Mild AS, Mod MR, Mod LAE, Mild RAE, Mod TR, PA peak pressure 65 mm Hg. MR ++ calcified, and RV mild/moderately reduced function.  - TEE 6/17 showed only mild MR with restricted posterior leaflet.  -Echo 10/09/17: EF 55-60%, mild to mod MR  -  Echo 12/20 EF 60-65% mod MR  - Echo 03/16/21 EF 60-65% RV normal. Mild MR/AI   - Likely functional MR and MR has improved with hemodynamic optimization.  Doing well now. MR mild.  - Repet echo in 6 months   3.  Chronic atrial fibrillation with tachy/brady syndrome: - Has previously failed amiodarone and DCCV x3.  - Continue Xarelto. No bleeding. CBC today - Zio 9/21 Chronic AF. Mean HR 90.  -  Now s/p PPM in 7/23. Device interrogated in clinic today. 72% AF. Rate ok on metoprolol/diltiazem - Followed by EP  4.  Essential hypertension:  - Blood pressure well controlled. Continue current regimen.  5. CKD IV with h/o AKI - Creatinine has been stable 1.8-1.9 - Recent labs with Scr 1.1  Glori Bickers, MD  2:33 PM   [facility] [address] [facility] and [place] [address] [phone] (office) [phone] (fax)

## 2021-10-08 ENCOUNTER — Telehealth: Payer: Self-pay | Admitting: Cardiology

## 2021-10-08 MED ORDER — DILTIAZEM HCL ER COATED BEADS 240 MG PO CP24: 240.0000 mg | ORAL_CAPSULE | Freq: Every day | ORAL | 3 refills | Status: DC

## 2021-10-08 NOTE — Addendum Note (Signed)
Addended by: Darrell Jewel on: 10/08/2021 05:34 PM   Modules accepted: Orders

## 2021-10-08 NOTE — Telephone Encounter (Signed)
Based on looking at the refill it looks like it was sent in under, "no print,". Resent in as normal.

## 2021-10-08 NOTE — Telephone Encounter (Signed)
*  STAT* If patient is at the pharmacy, call can be transferred to refill team.   1. Which medications need to be refilled? (please list name of each medication and dose if known) diltiazem (CARDIZEM CD) 240 MG 24 hr capsule  2. Which pharmacy/location (including street and city if local pharmacy) is medication to be sent to? [facility] [place], [address] AT [facility] [address] [place]  3. Do they need a 30 day or 90 day supply? 90 day  Patient states pharmacy never received it

## 2021-10-08 NOTE — Telephone Encounter (Signed)
Please advise if ok to refill. Last filled by  [facility] Ordering/Authorizing: Baldwin Jamaica, PA-C

## 2021-10-10 ENCOUNTER — Ambulatory Visit (INDEPENDENT_AMBULATORY_CARE_PROVIDER_SITE_OTHER): Payer: Medicare Other

## 2021-10-10 DIAGNOSIS — I495 Sick sinus syndrome: Secondary | ICD-10-CM

## 2021-10-10 DIAGNOSIS — Z950 Presence of cardiac pacemaker: Secondary | ICD-10-CM

## 2021-10-10 LAB — CUP PACEART INCLINIC DEVICE CHECK
Date Time Interrogation Session: 20230802165224
Implantable Lead Implant Date: 20230721
Implantable Lead Implant Date: 20230721
Implantable Lead Location: 753859
Implantable Lead Location: 753860
Implantable Pulse Generator Implant Date: 20230721
Pulse Gen Model: 2272
Pulse Gen Serial Number: 8087042

## 2021-10-10 NOTE — Patient Instructions (Addendum)
   After Your Pacemaker   Monitor your pacemaker site for redness, swelling, and drainage. Call the device clinic at [phone] if you experience these symptoms or fever/chills.  Your incision was closed with Steri-strips or staples:  You may shower 7 days after your procedure and wash your incision with soap and water. Avoid lotions, ointments, or perfumes over your incision until it is well-healed.  You may use a hot tub or a pool after your wound check appointment if the incision is completely closed.  Do not lift, push or pull greater than 10 pounds with the affected arm until 6 weeks after your procedure. There are no other restrictions in arm movement after your wound check appointment.  UNTIL AFTER SEPTEMBER 1ST.   You may drive, unless driving has been restricted by your healthcare providers.   Remote monitoring is used to monitor your pacemaker from home. This monitoring is scheduled every 91 days by our office. It allows [place] to keep an eye on the functioning of your device to ensure it is working properly. You will routinely see your Electrophysiologist annually (more often if necessary).

## 2021-10-10 NOTE — Progress Notes (Signed)
Wound check appointment. Steri-strips removed. Wound without redness or edema. Incision edges approximated, wound well healed. Normal device function. Thresholds, sensing, and impedances consistent with implant measurements. Device programmed at 3.5V/auto capture programmed on for extra safety margin until 3 month visit. Histogram distribution appropriate for patient and level of activity. AT/AF burden 85%.  All AFIB alerts disabled per Dr. Quentin Ore. Patient educated about wound care, arm mobility, lifting restrictions. ROV in 3 months with implanting physician.  Changes to Session:   Assistance with changes with [place] [employer] recommendations.  AT/AT Burden Alert turned OFF AT/AF Episode Alert turned OFF Atrial Tachycardia Detection Rate programmed from 170bpm to 140bpm PVAB programmed from 90 ms to 70m V Rate during AT/AF Alert programmed OFF

## 2021-10-11 ENCOUNTER — Other Ambulatory Visit (INDEPENDENT_AMBULATORY_CARE_PROVIDER_SITE_OTHER): Payer: Medicare Other

## 2021-10-11 DIAGNOSIS — M810 Age-related osteoporosis without current pathological fracture: Secondary | ICD-10-CM

## 2021-10-11 DIAGNOSIS — I10 Essential (primary) hypertension: Secondary | ICD-10-CM | POA: Diagnosis not present

## 2021-10-11 DIAGNOSIS — E7800 Pure hypercholesterolemia, unspecified: Secondary | ICD-10-CM | POA: Diagnosis not present

## 2021-10-11 DIAGNOSIS — D509 Iron deficiency anemia, unspecified: Secondary | ICD-10-CM

## 2021-10-11 DIAGNOSIS — K754 Autoimmune hepatitis: Secondary | ICD-10-CM | POA: Diagnosis not present

## 2021-10-11 LAB — BASIC METABOLIC PANEL
BUN: 36 mg/dL — ABNORMAL HIGH (ref 6–23)
CO2: 24 mEq/L (ref 19–32)
Calcium: 9.1 mg/dL (ref 8.4–10.5)
Chloride: 105 mEq/L (ref 96–112)
Creatinine, Ser: 1.33 mg/dL — ABNORMAL HIGH (ref 0.40–1.20)
GFR: 35.01 mL/min — ABNORMAL LOW (ref >60.00)
Glucose, Bld: 105 mg/dL — ABNORMAL HIGH (ref 70–99)
Potassium: 4.7 mEq/L (ref 3.5–5.1)
Sodium: 138 mEq/L (ref 135–145)

## 2021-10-11 LAB — HEPATIC FUNCTION PANEL
ALT: 16 U/L (ref 0–35)
AST: 12 U/L (ref 0–37)
Albumin: 4.1 g/dL (ref 3.5–5.2)
Alkaline Phosphatase: 94 U/L (ref 39–117)
Bilirubin, Direct: 0.1 mg/dL (ref 0.0–0.3)
Total Bilirubin: 0.5 mg/dL (ref 0.2–1.2)
Total Protein: 6.3 g/dL (ref 6.0–8.3)

## 2021-10-11 LAB — LIPID PANEL
Cholesterol: 152 mg/dL (ref 0–200)
HDL: 56.7 mg/dL (ref >39.00)
LDL Cholesterol: 68 mg/dL (ref 0–99)
NonHDL: 95.4
Total CHOL/HDL Ratio: 3
Triglycerides: 136 mg/dL (ref 0.0–149.0)
VLDL: 27.2 mg/dL (ref 0.0–40.0)

## 2021-10-11 LAB — VITAMIN D 25 HYDROXY (VIT D DEFICIENCY, FRACTURES): VITD: 72.22 ng/mL (ref 30.00–100.00)

## 2021-10-11 LAB — CBC WITH DIFFERENTIAL/PLATELET
Basophils Absolute: 0 10*3/uL (ref 0.0–0.1)
Basophils Relative: 0.8 % (ref 0.0–3.0)
Eosinophils Absolute: 0.3 10*3/uL (ref 0.0–0.7)
Eosinophils Relative: 5.6 % — ABNORMAL HIGH (ref 0.0–5.0)
HCT: 39.3 % (ref 36.0–46.0)
Hemoglobin: 12.6 g/dL (ref 12.0–15.0)
Lymphocytes Relative: 11.3 % — ABNORMAL LOW (ref 12.0–46.0)
Lymphs Abs: 0.6 10*3/uL — ABNORMAL LOW (ref 0.7–4.0)
MCHC: 32.1 g/dL (ref 30.0–36.0)
MCV: 104.2 fl — ABNORMAL HIGH (ref 78.0–100.0)
Monocytes Absolute: 0.6 10*3/uL (ref 0.1–1.0)
Monocytes Relative: 9.9 % (ref 3.0–12.0)
Neutro Abs: 4.1 10*3/uL (ref 1.4–7.7)
Neutrophils Relative %: 72.4 % (ref 43.0–77.0)
Platelets: 194 10*3/uL (ref 150.0–400.0)
RBC: 3.77 Mil/uL — ABNORMAL LOW (ref 3.87–5.11)
RDW: 15.9 % — ABNORMAL HIGH (ref 11.5–15.5)
WBC: 5.6 10*3/uL (ref 4.0–10.5)

## 2021-10-15 ENCOUNTER — Other Ambulatory Visit: Payer: Self-pay | Admitting: Internal Medicine

## 2021-10-16 DIAGNOSIS — E559 Vitamin D deficiency, unspecified: Secondary | ICD-10-CM | POA: Diagnosis not present

## 2021-10-16 DIAGNOSIS — M810 Age-related osteoporosis without current pathological fracture: Secondary | ICD-10-CM | POA: Diagnosis not present

## 2021-10-17 ENCOUNTER — Encounter: Payer: Self-pay | Admitting: Internal Medicine

## 2021-10-17 ENCOUNTER — Ambulatory Visit (INDEPENDENT_AMBULATORY_CARE_PROVIDER_SITE_OTHER): Payer: Medicare Other | Admitting: Internal Medicine

## 2021-10-17 VITALS — BP 122/78 | HR 119 | Temp 98.2°F | Resp 16 | Ht 62.0 in | Wt 135.6 lb

## 2021-10-17 DIAGNOSIS — I25118 Atherosclerotic heart disease of native coronary artery with other forms of angina pectoris: Secondary | ICD-10-CM

## 2021-10-17 DIAGNOSIS — N1832 Chronic kidney disease, stage 3b: Secondary | ICD-10-CM

## 2021-10-17 DIAGNOSIS — I10 Essential (primary) hypertension: Secondary | ICD-10-CM | POA: Diagnosis not present

## 2021-10-17 DIAGNOSIS — K219 Gastro-esophageal reflux disease without esophagitis: Secondary | ICD-10-CM

## 2021-10-17 DIAGNOSIS — K743 Primary biliary cirrhosis: Secondary | ICD-10-CM

## 2021-10-17 DIAGNOSIS — G6289 Other specified polyneuropathies: Secondary | ICD-10-CM | POA: Diagnosis not present

## 2021-10-17 DIAGNOSIS — I4819 Other persistent atrial fibrillation: Secondary | ICD-10-CM

## 2021-10-17 DIAGNOSIS — I272 Pulmonary hypertension, unspecified: Secondary | ICD-10-CM

## 2021-10-17 DIAGNOSIS — E039 Hypothyroidism, unspecified: Secondary | ICD-10-CM | POA: Diagnosis not present

## 2021-10-17 DIAGNOSIS — D329 Benign neoplasm of meninges, unspecified: Secondary | ICD-10-CM

## 2021-10-17 DIAGNOSIS — M810 Age-related osteoporosis without current pathological fracture: Secondary | ICD-10-CM

## 2021-10-17 DIAGNOSIS — I2510 Atherosclerotic heart disease of native coronary artery without angina pectoris: Secondary | ICD-10-CM

## 2021-10-17 DIAGNOSIS — I5032 Chronic diastolic (congestive) heart failure: Secondary | ICD-10-CM | POA: Diagnosis not present

## 2021-10-17 DIAGNOSIS — D509 Iron deficiency anemia, unspecified: Secondary | ICD-10-CM

## 2021-10-17 DIAGNOSIS — D500 Iron deficiency anemia secondary to blood loss (chronic): Secondary | ICD-10-CM

## 2021-10-17 DIAGNOSIS — K754 Autoimmune hepatitis: Secondary | ICD-10-CM

## 2021-10-17 DIAGNOSIS — E7800 Pure hypercholesterolemia, unspecified: Secondary | ICD-10-CM

## 2021-10-17 MED ORDER — TRAMADOL HCL 50 MG PO TABS: 50.0000 mg | ORAL_TABLET | Freq: Two times a day (BID) | ORAL | 1 refills | Status: DC | PRN

## 2021-10-17 NOTE — Progress Notes (Signed)
[name], female   DOB: [birth year], 88 y.o.   MRN: [id number]   Subjective:    [name], female    DOB: 05/27/1928, 88 y.o.   MRN: [id number]   Patient here for a scheduled follow up.   Chief Complaint  Patient presents with   Atrial Fibrillation   .   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  Diltiazem increased to '240mg'$  q day.  Echo 03/16/21 EF 60-65% RV normal. Mild MR/AI. Followed by cardiology.  Breathing is overall better.  No increased cough or congestion.  No chest pain reported.  Taking torsemide q week.  No acid reflux reported.  No abdominal pain.  Bowels moving.  Receiving prolia.  Blood pressure doing well.  Saw Dr Harlow Asa 09/27/21 - felt stable.  Recommended f/u in one year.  Request refill tramadol - needs to control pain to allow her to be more active.     Past Medical History:  Diagnosis Date   (HFpEF) heart failure with preserved ejection fraction ([place])    a. 07/2017 Echo: EF 50-55%, no rwma, mild AS/AI, mod MR, mod dil LA, mod TR, PASP 8mHg; b. 03/2021 Echo: EF 60-65%. No rwma. Nl RV fxn. Sev BAE. Mild MR. Mild AI/AS.   Autoimmune hepatitis ([place]    followed by Dr SGustavo Lah  Fibrocystic breast disease    Glaucoma    Hypercholesterolemia    Hypertension    Hypothyroidism    Inflammatory arthritis    MI (myocardial infarction) ([place]    Mitral regurgitation    a. 07/2017 Echo: Mod MR; b. 08/2017 Cath: 4+/Severe MR; c. 08/2017 TEE: Mild MR; d. 03/2021 Echo: Mild MR.   Neuropathy    Non-obstructive Coronary artery disease    a. 05/2014 NSTEMI/Cath: LM nl, LAD mild dzs, LCX 60ost hazy (? culprit), RCA mild dzs. EF 60% by echo-->Med Rx; b. 08/2017 Cath: LM 30ost, LAD min irregs, LCX small, 40ost/p, RCA large, min irregs, RPDA/RPL1 nl, EF 50-55%. 4+MR.   Osteoarthritis    Permanent atrial fibrillation (HCC)    a. CHA2DS2VASc = 6-->Xarelto.   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   Past Surgical History:  Procedure Laterality Date   ABDOMINAL HYSTERECTOMY  1963   partial, secondary to fibroids   APPENDECTOMY     Billroth     CARDIAC CATHETERIZATION  05/12/14   [facility]   CARDIOVERSION N/A 12/26/2016   Procedure: CARDIOVERSION;  Surgeon: AWellington Hampshire MD;  Location: [facility] ORS;  Service: Cardiovascular;  Laterality: N/A;   CARDIOVERSION N/A 05/16/2017   Procedure: CARDIOVERSION;  Surgeon: AWellington Hampshire MD;  Location: [facility] ORS;  Service: Cardiovascular;  Laterality: N/A;   CARDIOVERSION N/A 06/09/2017   Procedure: CARDIOVERSION;  Surgeon: AWellington Hampshire MD;  Location: [facility] ORS;  Service: Cardiovascular;  Laterality: N/A;   CARDIOVERSION N/A 11/30/2018   Procedure: CARDIOVERSION;  Surgeon: AWellington Hampshire MD;  Location: [facility] ORS;  Service: Cardiovascular;  Laterality: N/A;   [place] 2009   PACEMAKER IMPLANT N/A 09/28/2021   Procedure: PACEMAKER IMPLANT;  Surgeon: LVickie Epley MD;  [facility];  Service: Cardiovascular;  Laterality: N/A;   RIGHT/LEFT HEART CATH AND CORONARY ANGIOGRAPHY Bilateral 08/21/2017   Procedure: RIGHT/LEFT HEART CATH AND CORONARY ANGIOGRAPHY;  Surgeon: AWellington Hampshire MD;  [facility];  Service: Cardiovascular;  Laterality: Bilateral;  TEE WITHOUT CARDIOVERSION N/A 08/25/2017   Procedure: TRANSESOPHAGEAL ECHOCARDIOGRAM (TEE);  Surgeon: Jolaine Artist, MD;  Location: [facility] END[facility]COPY;  Service: Cardiovascular;  Laterality: N/A;   UPPER GI ENDOSCOPY  2004   Family History  Problem Relation Age of Onset   Stroke Mother    Esophageal cancer Brother        also had lung cancer   Ovarian cancer Daughter    Colon cancer Daughter    Breast cancer Neg Hx    Social History   Socioeconomic History   Marital status: Widowed    Spouse name: Not on file   Number of children: 2   Years of education: Not on file   Highest education level: Not on file   Occupational History   Occupation: retired  Tobacco Use   Smoking status: Never   Smokeless tobacco: Never  Vaping Use   Vaping Use: Never used  Substance and Sexual Activity   Alcohol use: No    Alcohol/week: 0.0 standard drinks of alcohol   Drug use: No   Sexual activity: Never  Other Topics Concern   Not on file  Social History Narrative   Not on file   Social Determinants of Health   Financial Resource Strain: Low Risk  (05/28/2021)   Overall Financial Resource Strain (CARDIA)    Difficulty of Paying Living Expenses: Not hard at all  Food Insecurity: No Food Insecurity (05/28/2021)   Hunger Vital Sign    Worried About Running Out of Food in the Last Year: Never true    [place] in the Last Year: Never true  Transportation Needs: No Transportation Needs (05/28/2021)   PRAPARE - Hydrologist (Medical): No    Lack of Transportation (Non-Medical): No  Physical Activity: Insufficiently Active (05/28/2021)   Exercise Vital Sign    Days of Exercise per Week: 3 days    Minutes of Exercise per Session: 40 min  Stress: No Stress Concern Present (05/28/2021)   [place]    Feeling of Stress : Not at all  Social Connections: Unknown (05/28/2021)   Social Connection and Isolation Panel [NHANES]    Frequency of Communication with Friends and Family: More than three times a week    Frequency of Social Gatherings with Friends and Family: More than three times a week    Attends Religious Services: Not on Advertising copywriter or Organizations: Not on file    Attends Archivist Meetings: Not on file    Marital Status: Not on file     Review of Systems  Constitutional:  Negative for appetite change and unexpected weight change.  HENT:  Negative for congestion and sinus pressure.   Respiratory:  Negative for cough, chest tightness and shortness of breath.    Cardiovascular:  Negative for chest pain and palpitations.       No increased swelling.   Gastrointestinal:  Negative for abdominal pain, diarrhea, nausea and vomiting.  Genitourinary:  Negative for difficulty urinating and dysuria.  Musculoskeletal:  Negative for joint swelling and myalgias.  Skin:  Negative for color change and rash.  Neurological:  Negative for dizziness, light-headedness and headaches.  Psychiatric/Behavioral:  Negative for agitation and dysphoric mood.        Objective:     BP 122/78 (BP Location: Left Arm, Patient Position: Sitting, Cuff Size: Small)   Pulse (!) 119   Temp  98.2 F (36.8 C) (Temporal)   Resp 16   Ht '5\' 2"'$  (1.575 m)   Wt 135 lb 9.6 oz (61.5 kg)   SpO2 97%   BMI 24.80 kg/m  Wt Readings from Last 3 Encounters:  10/17/21 135 lb 9.6 oz (61.5 kg)  10/03/21 132 lb 9.6 oz (60.1 kg)  09/28/21 132 lb (59.9 kg)    Physical Exam Vitals reviewed.  Constitutional:      General: She is not in acute distress.    Appearance: Normal appearance.  HENT:     Head: Normocephalic and atraumatic.     Right Ear: External ear normal.     Left Ear: External ear normal.  Eyes:     General: No scleral icterus.       Right eye: No discharge.        Left eye: No discharge.     Conjunctiva/sclera: Conjunctivae normal.  Neck:     Thyroid: No thyromegaly.  Cardiovascular:     Rate and Rhythm: Normal rate and regular rhythm.  Pulmonary:     Effort: No respiratory distress.     Breath sounds: Normal breath sounds. No wheezing.  Abdominal:     General: Bowel sounds are normal.     Palpations: Abdomen is soft.     Tenderness: There is no abdominal tenderness.  Musculoskeletal:        General: No swelling or tenderness.     Cervical back: Neck supple. No tenderness.  Lymphadenopathy:     Cervical: No cervical adenopathy.  Skin:    Findings: No erythema or rash.  Neurological:     Mental Status: She is alert.  Psychiatric:        Mood and Affect: Mood  normal.        Behavior: Behavior normal.      Outpatient Encounter Medications as of 10/17/2021  Medication Sig   allopurinol (ZYLOPRIM) 100 MG tablet TAKE 1/2 TABLET(50 MG) BY MOUTH DAILY   diltiazem (CARDIZEM CD) 240 MG 24 hr capsule Take 1 capsule (240 mg total) by mouth daily.   dorzolamide (TRUSOPT) 2 % ophthalmic solution Place 1 drop into the left eye 2 (two) times daily.   fluticasone (FLONASE) 50 MCG/ACT nasal spray Place 2 sprays into both nostrils daily as needed for allergies.   gabapentin (NEURONTIN) 300 MG capsule One capsule tid and two capsules q hs.   hydroxychloroquine (PLAQUENIL) 200 MG tablet Take 200 mg by mouth every other day.   ipratropium (ATROVENT) 0.03 % nasal spray Place 2 sprays into both nostrils 3 (three) times daily as needed for rhinitis.   latanoprost (XALATAN) 0.005 % ophthalmic solution Place 1 drop into both eyes at bedtime.    levothyroxine (SYNTHROID) 112 MCG tablet TAKE 1 TABLET(112 MCG) BY MOUTH DAILY BEFORE BREAKFAST   metoprolol tartrate (LOPRESSOR) 100 MG tablet Take 1 tablet (100 mg total) by mouth 2 (two) times daily.   Multiple Vitamin (MULTI-VITAMIN) tablet Take 1 tablet by mouth at bedtime.    Probiotic Product (ALIGN) 4 MG CAPS Take one capsule q day   torsemide (DEMADEX) 20 MG tablet Take 1 tablet (20 mg total) by mouth every other day.   ursodiol (ACTIGALL) 300 MG capsule Take 300-600 mg by mouth See admin instructions. Take 2 capsules (600 mg) daily in the morning   & 1 capsule (300 mg) at night.   vitamin B-12 (CYANOCOBALAMIN) 500 MCG tablet Take 500 mcg by mouth every Monday.   XARELTO 15 MG TABS tablet TAKE  1 TABLET(15 MG) BY MOUTH DAILY WITH SUPPER   [DISCONTINUED] traMADol (ULTRAM) 50 MG tablet Take 1 tablet (50 mg total) by mouth 2 (two) times daily as needed (for pain.).   traMADol (ULTRAM) 50 MG tablet Take 1 tablet (50 mg total) by mouth 2 (two) times daily as needed (for pain.).   No facility-administered encounter medications  on file as of 10/17/2021.     Lab Results  Component Value Date   WBC 5.6 10/11/2021   HGB 12.6 10/11/2021   HCT 39.3 10/11/2021   PLT 194.0 10/11/2021   GLUCOSE 88 10/26/2021   CHOL 152 10/11/2021   TRIG 136.0 10/11/2021   HDL 56.70 10/11/2021   LDLDIRECT 143.5 12/17/2012   LDLCALC 68 10/11/2021   ALT 16 10/11/2021   AST 12 10/11/2021   NA 136 10/26/2021   K 5.0 10/26/2021   CL 104 10/26/2021   CREATININE 1.11 (H) 10/26/2021   BUN 35 10/26/2021   CO2 20 10/26/2021   TSH 4.15 07/30/2021   INR 1.63 12/29/2017   HGBA1C 5.6 01/27/2018       Assessment & Plan:   Problem List Items Addressed This Visit     Anemia    Has iron deficiency.  Follow cbc and iron studies.       Atherosclerotic heart disease of native coronary artery with other forms of angina pectoris ([place])    Continue risk factor modification.  Continue metoprolol and xarelto.       Autoimmune hepatitis ([place])    Follow liver function tests.  Followed by GI.       Benign hypertension    On metoprolol and diltiazem.   Follow pressures.       Benign meningioma ([place])    Evaluated by Dr Jorene Minors (oncology [facility]).  States was told no further w/up or f/u neded.  Of note, no mention on MRI 2018.       Chronic diastolic heart failure (HCC)    Continue to weight daily.  Feels breathing is better.  Taking torsemide q week.  Follow.       CKD (chronic kidney disease) stage 3, GFR 30-59 ml/min (HCC) - Primary     Avoid antiinflammatories.  Follow metabolic panel.       Relevant Orders   Basic metabolic panel (Completed)   GERD (gastroesophageal reflux disease)    Upper symptoms controlled.  On protonix.       Hypercholesteremia    Unable to take statin medication.  Follow lipid panel.       Hypothyroidism    On thyroid replacement.  Follow tsh.        Iron deficiency anemia    Seeing Dr Tasia Catchings.  Last hgb wnl.  Follow cbc and iron studies.       Osteoporosis    prolia - last 04/24/21 (Dr Gabriel Carina).        Persistent atrial fibrillation (HCC)    On diltiazem '240mg'$  q day.  S/p pacemaker placement.  Followed by cardiology.       Primary biliary cholangitis (HCC)    Followed by GI. Stable. Follow liver function tests.        Pulmonary hypertension, unspecified ([place])    Followed by cardiology.       Peripheral neuropathy (Chronic)    On gabapentin.         Einar Pheasant, MD

## 2021-10-18 ENCOUNTER — Other Ambulatory Visit: Payer: Self-pay | Admitting: Internal Medicine

## 2021-10-18 ENCOUNTER — Telehealth: Payer: Self-pay

## 2021-10-18 DIAGNOSIS — E875 Hyperkalemia: Secondary | ICD-10-CM

## 2021-10-18 LAB — BASIC METABOLIC PANEL
BUN: 31 mg/dL — ABNORMAL HIGH (ref 6–23)
CO2: 21 mEq/L (ref 19–32)
Calcium: 8.8 mg/dL (ref 8.4–10.5)
Chloride: 107 mEq/L (ref 96–112)
Creatinine, Ser: 1.31 mg/dL — ABNORMAL HIGH (ref 0.40–1.20)
GFR: 35.65 mL/min — ABNORMAL LOW (ref >60.00)
Glucose, Bld: 62 mg/dL — ABNORMAL LOW (ref 70–99)
Potassium: 5.4 mEq/L — ABNORMAL HIGH (ref 3.5–5.1)
Sodium: 138 mEq/L (ref 135–145)

## 2021-10-18 NOTE — Progress Notes (Signed)
Order placed for stat potassium at [facility]

## 2021-10-18 NOTE — Telephone Encounter (Signed)
LMTCB patient needs potassium rechecked ASAP.

## 2021-10-19 ENCOUNTER — Other Ambulatory Visit
Admission: RE | Admit: 2021-10-19 | Discharge: 2021-10-19 | Disposition: A | Discharge status: Home / Self Care | Payer: Medicare Other | Attending: Internal Medicine | Admitting: Internal Medicine

## 2021-10-19 ENCOUNTER — Other Ambulatory Visit: Payer: Self-pay

## 2021-10-19 DIAGNOSIS — E875 Hyperkalemia: Secondary | ICD-10-CM | POA: Diagnosis not present

## 2021-10-19 LAB — POTASSIUM: Potassium: 5.3 mmol/L — ABNORMAL HIGH (ref 3.5–5.1)

## 2021-10-26 ENCOUNTER — Other Ambulatory Visit (INDEPENDENT_AMBULATORY_CARE_PROVIDER_SITE_OTHER): Payer: Medicare Other

## 2021-10-26 ENCOUNTER — Other Ambulatory Visit: Payer: Self-pay | Admitting: Internal Medicine

## 2021-10-26 DIAGNOSIS — E875 Hyperkalemia: Secondary | ICD-10-CM | POA: Diagnosis not present

## 2021-10-26 NOTE — Addendum Note (Signed)
Addended by: Neta Ehlers on: 10/26/2021 02:27 PM   Modules accepted: Orders

## 2021-10-27 LAB — BASIC METABOLIC PANEL
BUN/Creatinine Ratio: 32 — ABNORMAL HIGH (ref 12–28)
BUN: 35 mg/dL (ref 10–36)
CO2: 20 mmol/L (ref 20–29)
Calcium: 9.1 mg/dL (ref 8.7–10.3)
Chloride: 104 mmol/L (ref 96–106)
Creatinine, Ser: 1.11 mg/dL — ABNORMAL HIGH (ref 0.57–1.00)
Glucose: 88 mg/dL (ref 70–99)
Potassium: 5 mmol/L (ref 3.5–5.2)
Sodium: 136 mmol/L (ref 134–144)
eGFR: 47 mL/min/{1.73_m2} — ABNORMAL LOW (ref >59)

## 2021-10-28 ENCOUNTER — Encounter: Payer: Self-pay | Admitting: Internal Medicine

## 2021-10-28 NOTE — Assessment & Plan Note (Signed)
Followed by GI.  Stable.  Follow liver function tests.  

## 2021-10-28 NOTE — Assessment & Plan Note (Signed)
prolia - last 04/24/21 (Dr Gabriel Carina).

## 2021-10-28 NOTE — Assessment & Plan Note (Signed)
Upper symptoms controlled.  On protonix.  

## 2021-10-28 NOTE — Assessment & Plan Note (Signed)
Seeing Dr Tasia Catchings.  Last hgb wnl.  Follow cbc and iron studies.

## 2021-10-28 NOTE — Assessment & Plan Note (Signed)
Unable to take statin medication.  Follow lipid panel.  

## 2021-10-28 NOTE — Assessment & Plan Note (Signed)
Continue to weight daily.  Feels breathing is better.  Taking torsemide q week.  Follow.

## 2021-10-28 NOTE — Assessment & Plan Note (Signed)
Has iron deficiency.  Follow cbc and iron studies.  

## 2021-10-28 NOTE — Assessment & Plan Note (Signed)
Followed by cardiology 

## 2021-10-28 NOTE — Assessment & Plan Note (Signed)
Avoid antiinflammatories.  Follow metabolic panel.  

## 2021-10-28 NOTE — Assessment & Plan Note (Signed)
On gabapentin.  

## 2021-10-28 NOTE — Assessment & Plan Note (Signed)
On thyroid replacement.  Follow tsh.  

## 2021-10-28 NOTE — Assessment & Plan Note (Signed)
On diltiazem '240mg'$  q day.  S/p pacemaker placement.  Followed by cardiology.

## 2021-10-28 NOTE — Assessment & Plan Note (Signed)
Evaluated by Dr Allen Friedman (oncology [facility]).  States was told no further w/up or f/u neded.  Of note, no mention on MRI 2018.  

## 2021-10-28 NOTE — Assessment & Plan Note (Signed)
Continue risk factor modification.  Continue metoprolol and xarelto.  

## 2021-10-28 NOTE — Assessment & Plan Note (Signed)
Follow liver function tests.  Followed by GI.  

## 2021-10-28 NOTE — Assessment & Plan Note (Signed)
On metoprolol and diltiazem.   Follow pressures.

## 2021-10-29 DIAGNOSIS — N1831 Chronic kidney disease, stage 3a: Secondary | ICD-10-CM | POA: Diagnosis not present

## 2021-10-29 DIAGNOSIS — I10 Essential (primary) hypertension: Secondary | ICD-10-CM | POA: Diagnosis not present

## 2021-10-29 DIAGNOSIS — M810 Age-related osteoporosis without current pathological fracture: Secondary | ICD-10-CM | POA: Diagnosis not present

## 2021-10-29 DIAGNOSIS — N2581 Secondary hyperparathyroidism of renal origin: Secondary | ICD-10-CM | POA: Diagnosis not present

## 2021-10-31 ENCOUNTER — Ambulatory Visit: Payer: Medicare Other | Admitting: Cardiology

## 2021-11-10 DIAGNOSIS — M8588 Other specified disorders of bone density and structure, other site: Secondary | ICD-10-CM | POA: Diagnosis not present

## 2021-12-10 DIAGNOSIS — E790 Hyperuricemia without signs of inflammatory arthritis and tophaceous disease: Secondary | ICD-10-CM | POA: Diagnosis not present

## 2021-12-10 DIAGNOSIS — Z79899 Other long term (current) drug therapy: Secondary | ICD-10-CM | POA: Diagnosis not present

## 2021-12-10 DIAGNOSIS — Z8739 Personal history of other diseases of the musculoskeletal system and connective tissue: Secondary | ICD-10-CM | POA: Diagnosis not present

## 2021-12-11 NOTE — Progress Notes (Unsigned)
Electrophysiology Office Follow up Visit Note:    Date:  12/12/2021   ID:  Ruth Roth, DOB 1928-07-11, MRN [id number]  PCP:  Einar Pheasant, MD  [facility] Cardiologist:  [name], MD  [facility] Electrophysiologist:  Vickie Epley, MD    Interval History:    Ruth Roth is a 88 y.o. female who presents for a follow up visit.  She had a permanent pacemaker implanted September 28, 2021 for tachybradycardia syndrome.  Today she tells me that she is doing very well.  No complaints.  She is extremely active.  She is taking her Xarelto without any issues of bleeding.       Past Medical History:  Diagnosis Date   (HFpEF) heart failure with preserved ejection fraction ([place])    a. 07/2017 Echo: EF 50-55%, no rwma, mild AS/AI, mod MR, mod dil LA, mod TR, PASP 77mHg; b. 03/2021 Echo: EF 60-65%. No rwma. Nl RV fxn. Sev BAE. Mild MR. Mild AI/AS.   Autoimmune hepatitis ([place]    followed by Dr SGustavo Lah  Fibrocystic breast disease    Glaucoma    Hypercholesterolemia    Hypertension    Hypothyroidism    Inflammatory arthritis    MI (myocardial infarction) ([place]    Mitral regurgitation    a. 07/2017 Echo: Mod MR; b. 08/2017 Cath: 4+/Severe MR; c. 08/2017 TEE: Mild MR; d. 03/2021 Echo: Mild MR.   Neuropathy    Non-obstructive Coronary artery disease    a. 05/2014 NSTEMI/Cath: LM nl, LAD mild dzs, LCX 60ost hazy (? culprit), RCA mild dzs. EF 60% by echo-->Med Rx; b. 08/2017 Cath: LM 30ost, LAD min irregs, LCX small, 40ost/p, RCA large, min irregs, RPDA/RPL1 nl, EF 50-55%. 4+MR.   Osteoarthritis    Permanent atrial fibrillation (HCC)    a. CHA2DS2VASc = 6-->Xarelto.   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    Past Surgical History:  Procedure Laterality Date   ABDOMINAL HYSTERECTOMY  1963   partial, secondary to fibroids   APPENDECTOMY     Billroth     CARDIAC CATHETERIZATION  05/12/14   [facility]   CARDIOVERSION N/A 12/26/2016    Procedure: CARDIOVERSION;  Surgeon: AWellington Hampshire MD;  Location: [facility] ORS;  Service: Cardiovascular;  Laterality: N/A;   CARDIOVERSION N/A 05/16/2017   Procedure: CARDIOVERSION;  Surgeon: AWellington Hampshire MD;  Location: [facility] ORS;  Service: Cardiovascular;  Laterality: N/A;   CARDIOVERSION N/A 06/09/2017   Procedure: CARDIOVERSION;  Surgeon: AWellington Hampshire MD;  Location: [facility] ORS;  Service: Cardiovascular;  Laterality: N/A;   CARDIOVERSION N/A 11/30/2018   Procedure: CARDIOVERSION;  Surgeon: AWellington Hampshire MD;  Location: [facility] ORS;  Service: Cardiovascular;  Laterality: N/A;   [place] 2009   PACEMAKER IMPLANT N/A 09/28/2021   Procedure: PACEMAKER IMPLANT;  Surgeon: LVickie Epley MD;  [facility];  Service: Cardiovascular;  Laterality: N/A;   RIGHT/LEFT HEART CATH AND CORONARY ANGIOGRAPHY Bilateral 08/21/2017   Procedure: RIGHT/LEFT HEART CATH AND CORONARY ANGIOGRAPHY;  Surgeon: AWellington Hampshire MD;  [facility];  Service: Cardiovascular;  Laterality: Bilateral;   TEE WITHOUT CARDIOVERSION N/A 08/25/2017   Procedure: TRANSESOPHAGEAL ECHOCARDIOGRAM (TEE);  Surgeon: BJolaine Artist MD;  [facility];  Service: Cardiovascular;  Laterality: N/A;   UPPER GI ENDOSCOPY  2004    Current Medications: Current Meds  Medication Sig   allopurinol (ZYLOPRIM)  100 MG tablet TAKE 1/2 TABLET(50 MG) BY MOUTH DAILY   diltiazem (CARDIZEM CD) 240 MG 24 hr capsule Take 1 capsule (240 mg total) by mouth daily.   dorzolamide (TRUSOPT) 2 % ophthalmic solution Place 1 drop into the left eye 2 (two) times daily.   fluticasone (FLONASE) 50 MCG/ACT nasal spray Place 2 sprays into both nostrils daily as needed for allergies.   gabapentin (NEURONTIN) 300 MG capsule One capsule tid and two capsules q hs.   hydroxychloroquine (PLAQUENIL) 200 MG tablet Take 200 mg by mouth every other day.   ipratropium (ATROVENT) 0.03 % nasal  spray Place 2 sprays into both nostrils 3 (three) times daily as needed for rhinitis.   latanoprost (XALATAN) 0.005 % ophthalmic solution Place 1 drop into both eyes at bedtime.    levothyroxine (SYNTHROID) 112 MCG tablet TAKE 1 TABLET(112 MCG) BY MOUTH DAILY BEFORE BREAKFAST   Multiple Vitamin (MULTI-VITAMIN) tablet Take 1 tablet by mouth at bedtime.    Probiotic Product (ALIGN) 4 MG CAPS Take one capsule q day   traMADol (ULTRAM) 50 MG tablet Take 1 tablet (50 mg total) by mouth 2 (two) times daily as needed (for pain.).   ursodiol (ACTIGALL) 300 MG capsule Take 300-600 mg by mouth See admin instructions. Take 2 capsules (600 mg) daily in the morning   & 1 capsule (300 mg) at night.   vitamin B-12 (CYANOCOBALAMIN) 500 MCG tablet Take 500 mcg by mouth every Monday.   XARELTO 15 MG TABS tablet TAKE 1 TABLET(15 MG) BY MOUTH DAILY WITH SUPPER     Allergies:   Statins, Hydroxychloroquine, Haldol [haloperidol lactate], Librax [chlordiazepoxide-clidinium], Plavix [clopidogrel bisulfate], and Ramipril   Social History   Socioeconomic History   Marital status: Widowed    Spouse name: Not on file   Number of children: 2   Years of education: Not on file   Highest education level: Not on file  Occupational History   Occupation: retired  Tobacco Use   Smoking status: Never   Smokeless tobacco: Never  Vaping Use   Vaping Use: Never used  Substance and Sexual Activity   Alcohol use: No    Alcohol/week: 0.0 standard drinks of alcohol   Drug use: No   Sexual activity: Never  Other Topics Concern   Not on file  Social History Narrative   Not on file   Social Determinants of Health   Financial Resource Strain: Low Risk  (05/28/2021)   Overall Financial Resource Strain (CARDIA)    Difficulty of Paying Living Expenses: Not hard at all  Food Insecurity: No Food Insecurity (05/28/2021)   Hunger Vital Sign    Worried About Running Out of Food in the Last Year: Never true    [place] in  the Last Year: Never true  Transportation Needs: No Transportation Needs (05/28/2021)   PRAPARE - Hydrologist (Medical): No    Lack of Transportation (Non-Medical): No  Physical Activity: Insufficiently Active (05/28/2021)   Exercise Vital Sign    Days of Exercise per Week: 3 days    Minutes of Exercise per Session: 40 min  Stress: No Stress Concern Present (05/28/2021)   [place]    Feeling of Stress : Not at all  Social Connections: Unknown (05/28/2021)   Social Connection and Isolation Panel [NHANES]    Frequency of Communication with Friends and Family: More than three times a week    Frequency  of Social Gatherings with Friends and Family: More than three times a week    Attends Religious Services: Not on Advertising copywriter or Organizations: Not on file    Attends Archivist Meetings: Not on file    Marital Status: Not on file     Family History: The patient's family history includes Colon cancer in her daughter; Esophageal cancer in her brother; Ovarian cancer in her daughter; Stroke in her mother. There is no history of Breast cancer.  ROS:   Please see the history of present illness.    All other systems reviewed and are negative.  EKGs/Labs/Other Studies Reviewed:    The following studies were reviewed today:  December 12, 2021 in clinic device interrogation personally reviewed Battery longevity 6 to 9 years V pacing 41% A-fib burden 99% Decreased lead outputs to maximize battery longevity    Recent Labs: 07/30/2021: TSH 4.15 10/11/2021: ALT 16; Hemoglobin 12.6; Platelets 194.0 10/26/2021: BUN 35; Creatinine, Ser 1.11; Potassium 5.0; Sodium 136  Recent Lipid Panel    Component Value Date/Time   CHOL 152 10/11/2021 0956   TRIG 136.0 10/11/2021 0956   HDL 56.70 10/11/2021 0956   CHOLHDL 3 10/11/2021 0956   VLDL 27.2 10/11/2021 0956   LDLCALC 68  10/11/2021 0956   LDLDIRECT 143.5 12/17/2012 0955    Physical Exam:    VS:  BP 132/68   Pulse 67   Ht '5\' 2"'$  (1.575 m)   Wt 136 lb (61.7 kg)   SpO2 94%   BMI 24.87 kg/m     Wt Readings from Last 3 Encounters:  12/12/21 136 lb (61.7 kg)  10/17/21 135 lb 9.6 oz (61.5 kg)  10/03/21 132 lb 9.6 oz (60.1 kg)     GEN:  Well nourished, well developed in no acute distress.  Appears younger than stated age 78: Normal NECK: No JVD; No carotid bruits LYMPHATICS: No lymphadenopathy CARDIAC: Regular rhythm, no murmurs, rubs, gallops.  Pacemaker pocket well-healed RESPIRATORY:  Clear to auscultation without rales, wheezing or rhonchi  ABDOMEN: Soft, non-tender, non-distended MUSCULOSKELETAL:  No edema; No deformity  SKIN: Warm and dry NEUROLOGIC:  Alert and oriented x 3 PSYCHIATRIC:  Normal affect        ASSESSMENT:    1. Tachycardia-bradycardia syndrome ([place])   2. Cardiac pacemaker in situ   3. Persistent atrial fibrillation ([place])   4. Chronic diastolic heart failure (HCC)    PLAN:    In order of problems listed above:  #Tachybradycardia syndrome #Permanent pacemaker in situ Device functioning appropriately.  Continue remote monitoring.  #Atrial fibrillation, persistent Continue Xarelto for stroke prophylaxis.  Continue diltiazem and metoprolol.   #Chronic diastolic heart failure NYHA class II.  Warm and dry on exam today.  Follows with Dr. Haroldine Laws in the heart failure clinic.  Follow-up 1 year or sooner as needed with APP.   Medication Adjustments/Labs and Tests Ordered: Current medicines are reviewed at length with the patient today.  Concerns regarding medicines are outlined above.  No orders of the defined types were placed in this encounter.  No orders of the defined types were placed in this encounter.    Signed, Lars Mage, MD, [facility], [facility] 12/12/2021 2:04 PM    Electrophysiology [facility]

## 2021-12-12 ENCOUNTER — Encounter: Payer: Self-pay | Admitting: Internal Medicine

## 2021-12-12 ENCOUNTER — Ambulatory Visit: Payer: Medicare Other | Attending: Cardiology | Admitting: Cardiology

## 2021-12-12 ENCOUNTER — Encounter: Payer: Self-pay | Admitting: Cardiology

## 2021-12-12 VITALS — BP 132/68 | HR 67 | Ht 62.0 in | Wt 136.0 lb

## 2021-12-12 DIAGNOSIS — I5032 Chronic diastolic (congestive) heart failure: Secondary | ICD-10-CM | POA: Diagnosis not present

## 2021-12-12 DIAGNOSIS — I495 Sick sinus syndrome: Secondary | ICD-10-CM | POA: Insufficient documentation

## 2021-12-12 DIAGNOSIS — Z950 Presence of cardiac pacemaker: Secondary | ICD-10-CM | POA: Diagnosis not present

## 2021-12-12 DIAGNOSIS — I4819 Other persistent atrial fibrillation: Secondary | ICD-10-CM | POA: Insufficient documentation

## 2021-12-12 NOTE — Patient Instructions (Signed)
Medication Instructions:  none *If you need a refill on your cardiac medications before your next appointment, please call your pharmacy*   Lab Work: none If you have labs (blood work) drawn today and your tests are completely normal, you will receive your results only by: [place] (if you have MyChart) OR A paper copy in the mail If you have any lab test that is abnormal or we need to change your treatment, we will call you to review the results.   Testing/Procedures: none   Follow-Up: At [facility], you and your health needs are our priority.  As part of our continuing mission to provide you with exceptional heart care, we have created designated Provider Care Teams.  These Care Teams include your primary Cardiologist (physician) and Advanced Practice Providers (APPs -  Physician Assistants and Nurse Practitioners) who all work together to provide you with the care you need, when you need it.  We recommend signing up for the patient portal called "MyChart".  Sign up information is provided on this After Visit Summary.  MyChart is used to connect with patients for Virtual Visits (Telemedicine).  Patients are able to view lab/test results, encounter notes, upcoming appointments, etc.  Non-urgent messages can be sent to your provider as well.   To learn more about what you can do with MyChart, go to [email or web address].    Your next appointment:   1 year(s)  The format for your next appointment:   In Person  Provider:   You will see one of the following Advanced Practice Providers on your designated Care Team:   Murray Hodgkins, NP Christell Faith, PA-C Cadence Kathlen Mody, PA-C Gerrie Nordmann, NP      Other Instructions none  Important Information About Sugar

## 2021-12-13 ENCOUNTER — Ambulatory Visit: Payer: Medicare Other | Attending: Cardiovascular Disease | Admitting: Cardiovascular Disease

## 2021-12-13 ENCOUNTER — Encounter: Payer: Self-pay | Admitting: Cardiovascular Disease

## 2021-12-13 VITALS — BP 126/60 | HR 71 | Ht 62.0 in | Wt 135.5 lb

## 2021-12-13 DIAGNOSIS — I340 Nonrheumatic mitral (valve) insufficiency: Secondary | ICD-10-CM | POA: Insufficient documentation

## 2021-12-13 DIAGNOSIS — I10 Essential (primary) hypertension: Secondary | ICD-10-CM | POA: Insufficient documentation

## 2021-12-13 DIAGNOSIS — I2510 Atherosclerotic heart disease of native coronary artery without angina pectoris: Secondary | ICD-10-CM | POA: Diagnosis not present

## 2021-12-13 DIAGNOSIS — I5032 Chronic diastolic (congestive) heart failure: Secondary | ICD-10-CM | POA: Insufficient documentation

## 2021-12-13 DIAGNOSIS — I4821 Permanent atrial fibrillation: Secondary | ICD-10-CM | POA: Insufficient documentation

## 2021-12-13 MED ORDER — DILTIAZEM HCL ER COATED BEADS 180 MG PO CP24: 180.0000 mg | ORAL_CAPSULE | Freq: Every day | ORAL | 2 refills | Status: DC

## 2021-12-13 NOTE — Patient Instructions (Signed)
Medication Instructions:  Your physician has recommended you make the following change in your medication:   REDUCE Diltiazem to 180 mg daily. An Rx has been sent to your pharmacy.  *If you need a refill on your cardiac medications before your next appointment, please call your pharmacy*   Lab Work: None ordered If you have labs (blood work) drawn today and your tests are completely normal, you will receive your results only by: [place] (if you have MyChart) OR A paper copy in the mail If you have any lab test that is abnormal or we need to change your treatment, we will call you to review the results.   Testing/Procedures: None ordered   Follow-Up: At [facility], you and your health needs are our priority.  As part of our continuing mission to provide you with exceptional heart care, we have created designated Provider Care Teams.  These Care Teams include your primary Cardiologist (physician) and Advanced Practice Providers (APPs -  Physician Assistants and Nurse Practitioners) who all work together to provide you with the care you need, when you need it.  We recommend signing up for the patient portal called "MyChart".  Sign up information is provided on this After Visit Summary.  MyChart is used to connect with patients for Virtual Visits (Telemedicine).  Patients are able to view lab/test results, encounter notes, upcoming appointments, etc.  Non-urgent messages can be sent to your provider as well.   To learn more about what you can do with MyChart, go to [email or web address].    Your next appointment:   6 month(s)  The format for your next appointment:   In Person  Provider:   You may see [name], MD or one of the following Advanced Practice Providers on your designated Care Team:   Murray Hodgkins, NP Christell Faith, PA-C Cadence Kathlen Mody, PA-C Gerrie Nordmann, NP    Other Instructions N/A  Important Information About Sugar

## 2021-12-13 NOTE — Progress Notes (Signed)
Cardiology Office Note   Date:  12/13/2021   ID:  Ruth Roth, DOB 11/16/1928, MRN [id number]  PCP:  Einar Pheasant, MD  Cardiologist:   [name], MD   Chief Complaint  Patient presents with   Other    Pt wants check up c/o edema ankles. Meds reviewed verbally with pt.       History of Present Illness: Ruth Roth is a 88 y.o. female who presents for a follow-up visit regarding coronary artery disease, chronic diastolic heart failure, mitral regurgitation, tachybradycardia syndrome status post pacemaker placement and persistent atrial fibrillation .  She has known history of hypertension, chronic kidney disease, peptic ulcer disease, fibrocystic breast disease, inflammatory arthritis, autoimmune hepatitis with cirrhosis and neuropathy.  She had a small non-ST elevation myocardial infarction in March 2016 with acute diastolic heart failure after a GI illness.  Echocardiogram showed normal LV systolic function. Cardiac catheterization showed showed 60% ostial left circumflex hazy stenosis which was possibly the culprit. Mild LAD/RCA disease. She was treated medically.    She had worsening heart failure 2019.  Right and left cardiac catheterization in June 2019 showed  showed mild nonobstructive coronary artery disease.  Ejection fraction was 50 to 55% with severe mitral regurgitation.  Right heart catheterization showed severely elevated filling pressures with moderate to severe pulmonary hypertension and severely reduced cardiac output. I transferred the patient to [facility] where she was effectively diuresed with milrinone with subsequent improvement in symptoms.  She had a transesophageal echocardiogram done which showed normal LV systolic function with only mild mitral regurgitation.  The mitral regurgitation was felt to be functional with subsequent improvement after diuresis. Atrial fibrillation is currently being treated with rate control due to symptomatic maintaining sinus  rhythm.    Atrial fibrillation has been treated with rate control as we could not keep her in sinus rhythm even antiarrhythmic medication.  Most recent echocardiogram in January 2023 showed normal LV systolic function with mild mitral regurgitation.  She had significant fluctuations in heart rate and ultimately underwent pacemaker placement in July which helped with rate control of her atrial fibrillation.  The dose of diltiazem was doubled.  Since then, she reports worsening bilateral leg swelling.  No chest pain.  She reports stable exertional dyspnea.  Past Medical History:  Diagnosis Date   (HFpEF) heart failure with preserved ejection fraction ([place])    a. 07/2017 Echo: EF 50-55%, no rwma, mild AS/AI, mod MR, mod dil LA, mod TR, PASP 14mHg; b. 03/2021 Echo: EF 60-65%. No rwma. Nl RV fxn. Sev BAE. Mild MR. Mild AI/AS.   [place]immune [place]    followed by Dr Ruth Roth  Fibrocystic breast disease    Glaucoma    Hypercholesterolemia    Hypertension    Hypothyroidism    Inflammatory arthritis    [place]    Mitral regurgitation    a. 07/2017 Echo: Mod MR; b. 08/2017 Cath: 4+/Severe MR; c. 08/2017 TEE: Mild MR; d. 03/2021 Echo: Mild MR.   Neuropathy    Non-obstructive Coronary artery disease    a. 05/2014 NSTEMI/Cath: LM nl, LAD mild dzs, LCX 60ost hazy (? culprit), RCA mild dzs. EF 60% by echo-->Med Rx; b. 08/2017 Cath: LM 30ost, LAD min irregs, LCX small, 40ost/p, RCA large, min irregs, RPDA/RPL1 nl, EF 50-55%. 4+MR.   Osteoarthritis    Permanent atrial fibrillation (HCC)    a. CHA2DS2VASc = 6-->Xarelto.   PUD (peptic ulcer disease)    requiring [facility]  II surgery with resulting dumping syndrome    Past Surgical History:  Procedure Laterality Date   ABDOMINAL HYSTERECTOMY  1963   partial, secondary to fibroids   APPENDECTOMY     [facility]     CARDIAC CATHETERIZATION  05/12/14   [facility]   CARDIOVERSION N/A 12/26/2016   Procedure: CARDIOVERSION;  Surgeon:  Ruth Hampshire, MD;  Location: [facility] ORS;  Service: Cardiovascular;  Laterality: N/A;   CARDIOVERSION N/A 05/16/2017   Procedure: CARDIOVERSION;  Surgeon: Ruth Hampshire, MD;  Location: [facility] ORS;  Service: Cardiovascular;  Laterality: N/A;   CARDIOVERSION N/A 06/09/2017   Procedure: CARDIOVERSION;  Surgeon: Ruth Hampshire, MD;  Location: [facility] ORS;  Service: Cardiovascular;  Laterality: N/A;   CARDIOVERSION N/A 11/30/2018   Procedure: CARDIOVERSION;  Surgeon: Ruth Hampshire, MD;  Location: [place] ORS;  Service: Cardiovascular;  Laterality: N/A;   [name]  2009   PACEMAKER IMPLANT N/A 09/28/2021   Procedure: PACEMAKER IMPLANT;  Surgeon: Ruth Epley, MD;  Location: [place] CV LAB;  Service: Cardiovascular;  Laterality: N/A;   RIGHT/LEFT HEART CATH AND CORONARY ANGIOGRAPHY Bilateral 08/21/2017   Procedure: RIGHT/LEFT HEART CATH AND CORONARY ANGIOGRAPHY;  Surgeon: Ruth Hampshire, MD;  Location: [place] CV LAB;  Service: Cardiovascular;  Laterality: Bilateral;   TEE WITHOUT CARDIOVERSION N/A 08/25/2017   Procedure: TRANSESOPHAGEAL ECHOCARDIOGRAM (TEE);  Surgeon: Ruth Artist, MD;  Location: [facility] ENDOSCOPY;  Service: Cardiovascular;  Laterality: N/A;   UPPER GI ENDOSCOPY  2004     Current Outpatient Medications  Medication Sig Dispense Refill   diltiazem (CARDIZEM CD) 240 MG 24 hr capsule Take 1 capsule (240 mg total) by mouth daily. 90 capsule 3   dorzolamide (TRUSOPT) 2 % ophthalmic solution Place 1 drop into the left eye 2 (two) times daily.     fluticasone (FLONASE) 50 MCG/ACT nasal spray Place 2 sprays into both nostrils daily as needed for allergies.     gabapentin (NEURONTIN) 300 MG capsule One capsule tid and two capsules q hs. 150 capsule 2   hydroxychloroquine (PLAQUENIL) 200 MG tablet Take 200 mg by mouth every other day.     ipratropium (ATROVENT) 0.03 % nasal spray Place 2 sprays into both nostrils 3 (three) times daily as needed  for rhinitis.     latanoprost (XALATAN) 0.005 % ophthalmic solution Place 1 drop into both eyes at bedtime.      levothyroxine (SYNTHROID) 112 MCG tablet TAKE 1 TABLET(112 MCG) BY MOUTH DAILY BEFORE BREAKFAST 90 tablet 2   metoprolol tartrate (LOPRESSOR) 100 MG tablet Take 1 tablet (100 mg total) by mouth 2 (two) times daily. 60 tablet 3   Multiple Vitamin (MULTI-VITAMIN) tablet Take 1 tablet by mouth at bedtime.      Probiotic Product (ALIGN) 4 MG CAPS Take one capsule q day 30 capsule 0   torsemide (DEMADEX) 20 MG tablet Take 1 tablet (20 mg total) by mouth every other day. 45 tablet 3   traMADol (ULTRAM) 50 MG tablet Take 1 tablet (50 mg total) by mouth 2 (two) times daily as needed (for pain.). 60 tablet 1   ursodiol (ACTIGALL) 300 MG capsule Take 300-600 mg by mouth See admin instructions. Take 2 capsules (600 mg) daily in the morning   & 1 capsule (300 mg) at night.     vitamin B-12 (CYANOCOBALAMIN) 500 MCG tablet Take 500 mcg by mouth every Monday.     XARELTO 15 MG TABS tablet TAKE 1 TABLET(15 MG) BY  MOUTH DAILY WITH SUPPER 90 tablet 1   allopurinol (ZYLOPRIM) 100 MG tablet TAKE 1/2 TABLET(50 MG) BY MOUTH DAILY (Patient not taking: Reported on 12/13/2021) 45 tablet 1   No current facility-administered medications for this visit.    Allergies:   Statins, Hydroxychloroquine, Haldol [haloperidol lactate], Librax [chlordiazepoxide-clidinium], Plavix [clopidogrel bisulfate], and Ramipril    Social History:  The patient  reports that she has never smoked. She has never used smokeless tobacco. She reports that she does not drink alcohol and does not use drugs.   Family History:  The patient's family history includes Colon cancer in her daughter; Esophageal cancer in her brother; Ovarian cancer in her daughter; Stroke in her mother.    ROS:  Please see the history of present illness.   Otherwise, review of systems are positive for none.   All other systems are reviewed and negative.     PHYSICAL EXAM: VS:  BP 126/60 (BP Location: Left Arm, Patient Position: Sitting, Cuff Size: Normal)   Pulse 71   Ht '5\' 2"'$  (1.575 m)   Wt 135 lb 8 oz (61.5 kg)   SpO2 97%   BMI 24.78 kg/m  , BMI Body mass index is 24.78 kg/m. GEN: Well nourished, well developed, in no acute distress  HEENT: normal  Neck: No JVD, carotid bruits, or masses Cardiac: Irregularly irregular; no rubs, or gallops.  1/ 6 systolic ejection murmur at the aortic area.  Mild bilateral leg edema Respiratory: Clear to auscultation GI: soft, nontender, nondistended, + BS MS: no deformity or atrophy  Skin: warm and dry, no rash Neuro:  Strength and sensation are intact Psych: euthymic mood, full affect   EKG:  EKG is ordered today. The ekg ordered today demonstrates: Coarse atrial fibrillation with ventricular paced beats.  Recent Labs: 07/30/2021: TSH 4.15 10/11/2021: ALT 16; Hemoglobin 12.6; Platelets 194.0 10/26/2021: BUN 35; Creatinine, Ser 1.11; Potassium 5.0; Sodium 136    Lipid Panel    Component Value Date/Time   CHOL 152 10/11/2021 0956   TRIG 136.0 10/11/2021 0956   HDL 56.70 10/11/2021 0956   CHOLHDL 3 10/11/2021 0956   VLDL 27.2 10/11/2021 0956   LDLCALC 68 10/11/2021 0956   LDLDIRECT 143.5 12/17/2012 0955      Wt Readings from Last 3 Encounters:  12/13/21 135 lb 8 oz (61.5 kg)  12/12/21 136 lb (61.7 kg)  10/17/21 135 lb 9.6 oz (61.5 kg)       ASSESSMENT AND PLAN:  1.    Permanent atrial fibrillation: Ventricular rate is well controlled on metoprolol and diltiazem.  She is tolerating anticoagulation with low-dose Xarelto.  If her creatinine continues to run above 1.5, I will consider switching her to low-dose Eliquis to minimize the risk of bleeding. Given worsening bilateral leg edema, I elected to decrease diltiazem extended release 180 mg once daily.  Continue metoprolol 100 mg twice daily.  2.  Chronic diastolic heart failure: She appears to be euvolemic on current dose of  torsemide.  Recent labs showed stable renal function with a creatinine of 1.4.   3. Essential hypertension: Blood pressures well controlled on current medications.   4.  Functional mitral regurgitation: This was mild on recent echocardiogram.  5.  Coronary artery disease involving native coronary arteries without angina: Continue medical therapy.   Disposition:   FU with me in 6 months.  Signed,  [name], MD  12/13/2021 2:28 PM    [place]

## 2021-12-19 ENCOUNTER — Encounter: Payer: Self-pay | Admitting: Internal Medicine

## 2021-12-19 ENCOUNTER — Ambulatory Visit (INDEPENDENT_AMBULATORY_CARE_PROVIDER_SITE_OTHER): Payer: Medicare Other | Admitting: Internal Medicine

## 2021-12-19 VITALS — BP 120/68 | HR 115 | Temp 97.8°F | Ht 62.0 in | Wt 137.6 lb

## 2021-12-19 DIAGNOSIS — J329 Chronic sinusitis, unspecified: Secondary | ICD-10-CM

## 2021-12-19 DIAGNOSIS — I5032 Chronic diastolic (congestive) heart failure: Secondary | ICD-10-CM | POA: Diagnosis not present

## 2021-12-19 DIAGNOSIS — D329 Benign neoplasm of meninges, unspecified: Secondary | ICD-10-CM | POA: Diagnosis not present

## 2021-12-19 DIAGNOSIS — I10 Essential (primary) hypertension: Secondary | ICD-10-CM

## 2021-12-19 DIAGNOSIS — K743 Primary biliary cirrhosis: Secondary | ICD-10-CM

## 2021-12-19 DIAGNOSIS — D500 Iron deficiency anemia secondary to blood loss (chronic): Secondary | ICD-10-CM

## 2021-12-19 DIAGNOSIS — I25118 Atherosclerotic heart disease of native coronary artery with other forms of angina pectoris: Secondary | ICD-10-CM | POA: Diagnosis not present

## 2021-12-19 DIAGNOSIS — K754 Autoimmune hepatitis: Secondary | ICD-10-CM

## 2021-12-19 DIAGNOSIS — N1832 Chronic kidney disease, stage 3b: Secondary | ICD-10-CM

## 2021-12-19 DIAGNOSIS — K219 Gastro-esophageal reflux disease without esophagitis: Secondary | ICD-10-CM

## 2021-12-19 DIAGNOSIS — Z1231 Encounter for screening mammogram for malignant neoplasm of breast: Secondary | ICD-10-CM

## 2021-12-19 DIAGNOSIS — I272 Pulmonary hypertension, unspecified: Secondary | ICD-10-CM

## 2021-12-19 DIAGNOSIS — I4819 Other persistent atrial fibrillation: Secondary | ICD-10-CM | POA: Diagnosis not present

## 2021-12-19 DIAGNOSIS — E039 Hypothyroidism, unspecified: Secondary | ICD-10-CM | POA: Diagnosis not present

## 2021-12-19 DIAGNOSIS — I495 Sick sinus syndrome: Secondary | ICD-10-CM | POA: Diagnosis not present

## 2021-12-19 DIAGNOSIS — E7800 Pure hypercholesterolemia, unspecified: Secondary | ICD-10-CM

## 2021-12-19 DIAGNOSIS — I2510 Atherosclerotic heart disease of native coronary artery without angina pectoris: Secondary | ICD-10-CM

## 2021-12-19 LAB — BASIC METABOLIC PANEL
BUN: 27 mg/dL — ABNORMAL HIGH (ref 6–23)
CO2: 22 mEq/L (ref 19–32)
Calcium: 8.4 mg/dL (ref 8.4–10.5)
Chloride: 107 mEq/L (ref 96–112)
Creatinine, Ser: 1.13 mg/dL (ref 0.40–1.20)
GFR: 42.52 mL/min — ABNORMAL LOW (ref >60.00)
Glucose, Bld: 102 mg/dL — ABNORMAL HIGH (ref 70–99)
Potassium: 5.6 mEq/L — ABNORMAL HIGH (ref 3.5–5.1)
Sodium: 135 mEq/L (ref 135–145)

## 2021-12-19 LAB — HEPATIC FUNCTION PANEL
ALT: 15 U/L (ref 0–35)
AST: 15 U/L (ref 0–37)
Albumin: 4 g/dL (ref 3.5–5.2)
Alkaline Phosphatase: 121 U/L — ABNORMAL HIGH (ref 39–117)
Bilirubin, Direct: 0.1 mg/dL (ref 0.0–0.3)
Total Bilirubin: 0.3 mg/dL (ref 0.2–1.2)
Total Protein: 6.3 g/dL (ref 6.0–8.3)

## 2021-12-19 LAB — TSH: TSH: 10.34 u[IU]/mL — ABNORMAL HIGH (ref 0.35–5.50)

## 2021-12-19 MED ORDER — AMOXICILLIN 875 MG PO TABS: 875.0000 mg | ORAL_TABLET | Freq: Two times a day (BID) | ORAL | 0 refills | Status: AC

## 2021-12-19 NOTE — Progress Notes (Signed)
Patient ID: SHAVONDA WIEDMAN, female   DOB: 1929-01-29, 88 y.o.   MRN: [id number]   Subjective:    Patient ID: [name], female    DOB: 06/06/1928, 88 y.o.   MRN: [id number]   Patient here for  Chief Complaint  Patient presents with   Follow-up    2 month follow up   .   HPI Here to follow up regarding afib, CAD, chronic diastolic heart failure and hypertension.  S/p pacemaker placement in July. On xarelto. Was evaluated by Dr Fletcher Anon 12/13/21 - worsening edema.  He decreased her diltiazem to '180mg'$ .  Recommended continuing metoprolol '100mg'$  bid.  Swelling has improved. Saw Dr Quentin Ore 12/12/21 - stable. 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  Evaluated 12/10/21 - worsening joint/hip pain.  Takes allopurinal and plaquenil.  Needs tramadol prn.  Knee - worse when first up.  Continue f/u with ortho/rheumatology. Sees Dr Holley Raring for her CKD. Reports over the last week, increased sinus pressure and increased drainage.  Yellow mucus production.  No fever.  No chest congestion or increased cough.  No chest pain.  Breathing stable.  No nausea or vomiting.  Prolia - 10/29/21.    Past Medical History:  Diagnosis Date   (HFpEF) heart failure with preserved ejection fraction ([place])    a. 07/2017 Echo: EF 50-55%, no rwma, mild AS/AI, mod MR, mod dil LA, mod TR, PASP 32mHg; b. 03/2021 Echo: EF 60-65%. No rwma. Nl RV fxn. Sev BAE. Mild MR. Mild AI/AS.   Autoimmune hepatitis ([place]    followed by Dr SGustavo Lah  Fibrocystic breast disease    Glaucoma    Hypercholesterolemia    Hypertension    Hypothyroidism    Inflammatory arthritis    MI (myocardial infarction) ([place]    Mitral regurgitation    a. 07/2017 Echo: Mod MR; b. 08/2017 Cath: 4+/Severe MR; c. 08/2017 TEE: Mild MR; d. 03/2021 Echo: Mild MR.   Neuropathy    Non-obstructive Coronary artery disease    a. 05/2014 NSTEMI/Cath: LM nl, LAD mild dzs, LCX 60ost hazy (? culprit), RCA mild dzs. EF 60% by echo-->Med Rx; b. 08/2017 Cath: LM 30ost,  LAD min irregs, LCX small, 40ost/p, RCA large, min irregs, RPDA/RPL1 nl, EF 50-55%. 4+MR.   Osteoarthritis    Permanent atrial fibrillation (HCC)    a. CHA2DS2VASc = 6-->Xarelto.   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   Past Surgical History:  Procedure Laterality Date   ABDOMINAL HYSTERECTOMY  1963   partial, secondary to fibroids   APPENDECTOMY     Billroth     CARDIAC CATHETERIZATION  05/12/14   [facility]   CARDIOVERSION N/A 12/26/2016   Procedure: CARDIOVERSION;  Surgeon: AWellington Hampshire MD;  Location: [facility] ORS;  Service: Cardiovascular;  Laterality: N/A;   CARDIOVERSION N/A 05/16/2017   Procedure: CARDIOVERSION;  Surgeon: AWellington Hampshire MD;  Location: [facility] ORS;  Service: Cardiovascular;  Laterality: N/A;   CARDIOVERSION N/A 06/09/2017   Procedure: CARDIOVERSION;  Surgeon: AWellington Hampshire MD;  Location: [facility] ORS;  Service: Cardiovascular;  Laterality: N/A;   CARDIOVERSION N/A 11/30/2018   Procedure: CARDIOVERSION;  Surgeon: AWellington Hampshire MD;  Location: [facility] ORS;  Service: Cardiovascular;  Laterality: N/A;   [place]   PACEMAKER IMPLANT N/A 09/28/2021   Procedure: PACEMAKER IMPLANT;  Surgeon: LVickie Epley MD;  [facility];  Service: Cardiovascular;  Laterality: N/A;  RIGHT/LEFT HEART CATH AND CORONARY ANGIOGRAPHY Bilateral 08/21/2017   Procedure: RIGHT/LEFT HEART CATH AND CORONARY ANGIOGRAPHY;  Surgeon: [name], MD;  Location: [facility];  Service: Cardiovascular;  Laterality: Bilateral;   TEE WITHOUT CARDIOVERSION N/A 08/25/2017   Procedure: TRANSESOPHAGEAL ECHOCARDIOGRAM (TEE);  Surgeon: Jolaine Artist, MD;  Location: [facility];  Service: Cardiovascular;  Laterality: N/A;   UPPER GI ENDOSCOPY  2004   Family History  Problem Relation Age of Onset   Stroke Mother    Esophageal cancer Brother        also had lung cancer   Ovarian cancer  Daughter    Colon cancer Daughter    Breast cancer Neg Hx    Social History   Socioeconomic History   Marital status: Widowed    Spouse name: Not on file   Number of children: 2   Years of education: Not on file   Highest education level: Not on file  Occupational History   Occupation: retired  Tobacco Use   Smoking status: Never   Smokeless tobacco: Never  Vaping Use   Vaping Use: Never used  Substance and Sexual Activity   Alcohol use: No    Alcohol/week: 0.0 standard drinks of alcohol   Drug use: No   Sexual activity: Never  Other Topics Concern   Not on file  Social History Narrative   Not on file   Social Determinants of Health   Financial Resource Strain: Low Risk  (05/28/2021)   Overall Financial Resource Strain (CARDIA)    Difficulty of Paying Living Expenses: Not hard at all  Food Insecurity: No Food Insecurity (05/28/2021)   Hunger Vital Sign    Worried About Running Out of Food in the Last Year: Never true    [place] in the Last Year: Never true  Transportation Needs: No Transportation Needs (05/28/2021)   PRAPARE - Hydrologist (Medical): No    Lack of Transportation (Non-Medical): No  Physical Activity: Insufficiently Active (05/28/2021)   Exercise Vital Sign    Days of Exercise per Week: 3 days    Minutes of Exercise per Session: 40 min  Stress: No Stress Concern Present (05/28/2021)   [place]    Feeling of Stress : Not at all  Social Connections: Unknown (05/28/2021)   Social Connection and Isolation Panel [NHANES]    Frequency of Communication with Friends and Family: More than three times a week    Frequency of Social Gatherings with Friends and Family: More than three times a week    Attends Religious Services: Not on Advertising copywriter or Organizations: Not on file    Attends Archivist Meetings: Not on file    Marital  Status: Not on file     Review of Systems  Constitutional:  Negative for appetite change, fever and unexpected weight change.  HENT:  Positive for congestion, postnasal drip and sinus pressure.   Respiratory:  Negative for cough and chest tightness.        Breathing stable.   Cardiovascular:  Negative for chest pain and palpitations.       No increased swelling. Improved.   Gastrointestinal:  Negative for abdominal pain, diarrhea, nausea and vomiting.  Genitourinary:  Negative for difficulty urinating and dysuria.  Musculoskeletal:  Negative for joint swelling and myalgias.  Skin:  Negative for color change and rash.  Neurological:  Negative for dizziness and headaches.  Psychiatric/Behavioral:  Negative for agitation and dysphoric mood.        Objective:     BP 120/68 (BP Location: Left Arm, Patient Position: Sitting, Cuff Size: Normal)   Pulse (!) 115   Temp 97.8 F (36.6 C) (Oral)   Ht '5\' 2"'$  (1.575 m)   Wt 137 lb 9.6 oz (62.4 kg)   SpO2 96%   BMI 25.17 kg/m  Wt Readings from Last 3 Encounters:  12/19/21 137 lb 9.6 oz (62.4 kg)  12/13/21 135 lb 8 oz (61.5 kg)  12/12/21 136 lb (61.7 kg)    Physical Exam Vitals reviewed.  Constitutional:      General: She is not in acute distress.    Appearance: Normal appearance.  HENT:     Head: Normocephalic and atraumatic.     Right Ear: External ear normal.     Left Ear: External ear normal.  Eyes:     General: No scleral icterus.       Right eye: No discharge.        Left eye: No discharge.     Conjunctiva/sclera: Conjunctivae normal.  Neck:     Thyroid: No thyromegaly.  Cardiovascular:     Rate and Rhythm: Normal rate and regular rhythm.  Pulmonary:     Effort: No respiratory distress.     Breath sounds: Normal breath sounds. No wheezing.  Abdominal:     General: Bowel sounds are normal.     Palpations: Abdomen is soft.     Tenderness: There is no abdominal tenderness.  Musculoskeletal:        General: No  swelling or tenderness.     Cervical back: Neck supple. No tenderness.  Lymphadenopathy:     Cervical: No cervical adenopathy.  Skin:    Findings: No erythema or rash.  Neurological:     Mental Status: She is alert.  Psychiatric:        Mood and Affect: Mood normal.        Behavior: Behavior normal.      Outpatient Encounter Medications as of 12/19/2021  Medication Sig   allopurinol (ZYLOPRIM) 100 MG tablet TAKE 1/2 TABLET(50 MG) BY MOUTH DAILY   amoxicillin (AMOXIL) 875 MG tablet Take 1 tablet (875 mg total) by mouth 2 (two) times daily for 10 days.   diltiazem (CARDIZEM CD) 180 MG 24 hr capsule Take 1 capsule (180 mg total) by mouth daily.   dorzolamide (TRUSOPT) 2 % ophthalmic solution Place 1 drop into the left eye 2 (two) times daily.   fluticasone (FLONASE) 50 MCG/ACT nasal spray Place 2 sprays into both nostrils daily as needed for allergies.   gabapentin (NEURONTIN) 300 MG capsule One capsule tid and two capsules q hs.   hydroxychloroquine (PLAQUENIL) 200 MG tablet Take 200 mg by mouth every other day.   ipratropium (ATROVENT) 0.03 % nasal spray Place 2 sprays into both nostrils 3 (three) times daily as needed for rhinitis.   latanoprost (XALATAN) 0.005 % ophthalmic solution Place 1 drop into both eyes at bedtime.    levothyroxine (SYNTHROID) 112 MCG tablet TAKE 1 TABLET(112 MCG) BY MOUTH DAILY BEFORE BREAKFAST   Multiple Vitamin (MULTI-VITAMIN) tablet Take 1 tablet by mouth at bedtime.    Probiotic Product (ALIGN) 4 MG CAPS Take one capsule q day   traMADol (ULTRAM) 50 MG tablet Take 1 tablet (50 mg total) by mouth 2 (two) times daily as needed (for pain.).   ursodiol (ACTIGALL) 300 MG capsule  Take 300-600 mg by mouth See admin instructions. Take 2 capsules (600 mg) daily in the morning   & 1 capsule (300 mg) at night.   vitamin B-12 (CYANOCOBALAMIN) 500 MCG tablet Take 500 mcg by mouth every Monday.   XARELTO 15 MG TABS tablet TAKE 1 TABLET(15 MG) BY MOUTH DAILY WITH SUPPER    metoprolol tartrate (LOPRESSOR) 100 MG tablet Take 1 tablet (100 mg total) by mouth 2 (two) times daily.   torsemide (DEMADEX) 20 MG tablet Take 1 tablet (20 mg total) by mouth every other day.   No facility-administered encounter medications on file as of 12/19/2021.     Lab Results  Component Value Date   WBC 5.6 10/11/2021   HGB 12.6 10/11/2021   HCT 39.3 10/11/2021   PLT 194.0 10/11/2021   GLUCOSE 102 (H) 12/19/2021   CHOL 152 10/11/2021   TRIG 136.0 10/11/2021   HDL 56.70 10/11/2021   LDLDIRECT 143.5 12/17/2012   LDLCALC 68 10/11/2021   ALT 15 12/19/2021   AST 15 12/19/2021   NA 135 12/19/2021   K 5.6 No hemolysis seen (H) 12/19/2021   CL 107 12/19/2021   CREATININE 1.13 12/19/2021   BUN 27 (H) 12/19/2021   CO2 22 12/19/2021   TSH 10.34 (H) 12/19/2021   INR 1.63 12/29/2017   HGBA1C 5.6 01/27/2018       Assessment & Plan:   Problem List Items Addressed This Visit     Atherosclerotic heart disease of native coronary artery with other forms of angina pectoris ([place])    Continue risk factor modification.  Continue metoprolol and xarelto.       Autoimmune hepatitis ([place])    Follow liver function tests.  Followed by GI.       Benign hypertension    On metoprolol and diltiazem.   Follow pressures.       Benign meningioma ([place])    Evaluated by Dr Jorene Minors (oncology [facility]).  States was told no further w/up or f/u neded.  Of note, no mention on MRI 2018.       Chronic diastolic heart failure (HCC)    Continue to weigh daily.  Feels breathing is better.  No evidence of volume overload today. Follow.       CKD (chronic kidney disease) stage 3, GFR 30-59 ml/min (HCC)     Avoid antiinflammatories.  Follow metabolic panel.       Relevant Orders   Hepatic function panel (Completed)   Basic metabolic panel (Completed)   GERD (gastroesophageal reflux disease)    Upper symptoms controlled.  On protonix.       Hypercholesteremia    Unable to take statin  medication.  Follow lipid panel.       Hypothyroidism    On thyroid replacement.  Follow tsh.        Iron deficiency anemia    Has iron deficiency.  Follow cbc and iron studies.       Persistent atrial fibrillation ([place])    Dr Fletcher Anon (12/13/21) - decreased diltiazem to '180mg'$  q day.  Lower extremity swelling has improved.  Overall heart stable.        Relevant Orders   TSH (Completed)   Primary biliary cholangitis ([place])    Followed by GI. Stable. Follow liver function tests.        Pulmonary hypertension, unspecified ([place])    Followed by cardiology.       Sinusitis    Symptoms and exam appear to be c/w sinus  infection.  Feels similar to previous infections.  Amoxicillin as directed.  Saline nasal spray and steroid nasal spray as directed.  Follow.  Call with update.        Relevant Medications   amoxicillin (AMOXIL) 875 MG tablet   Tachy-brady syndrome (HCC)    S/p pacemaker placement 09/2021.       Other Visit Diagnoses     Encounter for screening mammogram for malignant neoplasm of breast    -  Primary   Relevant Orders   MM 3D SCREEN BREAST BILATERAL        Einar Pheasant, MD

## 2021-12-19 NOTE — Patient Instructions (Signed)
Saline nasal spray - flush nose at least 2x/day  Flonase nasal spray  - 2 sprays each nostril one time per day.  Do this in the evening.   Take a probiotic daily while you are on the antibiotics and for two weeks after completing the antibiotics.

## 2021-12-20 ENCOUNTER — Telehealth: Payer: Self-pay

## 2021-12-20 ENCOUNTER — Other Ambulatory Visit: Payer: Self-pay | Admitting: Internal Medicine

## 2021-12-20 DIAGNOSIS — E875 Hyperkalemia: Secondary | ICD-10-CM

## 2021-12-20 NOTE — Progress Notes (Signed)
Order placed for f/u stat potassium.

## 2021-12-20 NOTE — Telephone Encounter (Signed)
LMTCB for lab results.  

## 2021-12-23 ENCOUNTER — Encounter: Payer: Self-pay | Admitting: Internal Medicine

## 2021-12-23 NOTE — Assessment & Plan Note (Signed)
S/p pacemaker placement 09/2021.

## 2021-12-23 NOTE — Assessment & Plan Note (Signed)
Symptoms and exam appear to be c/w sinus infection.  Feels similar to previous infections.  Amoxicillin as directed.  Saline nasal spray and steroid nasal spray as directed.  Follow.  Call with update.

## 2021-12-23 NOTE — Assessment & Plan Note (Signed)
Evaluated by Dr Allen Friedman (oncology [facility]).  States was told no further w/up or f/u neded.  Of note, no mention on MRI 2018.  

## 2021-12-23 NOTE — Assessment & Plan Note (Signed)
Has iron deficiency.  Follow cbc and iron studies.  

## 2021-12-23 NOTE — Assessment & Plan Note (Signed)
Continue risk factor modification.  Continue metoprolol and xarelto.  

## 2021-12-23 NOTE — Assessment & Plan Note (Signed)
Avoid antiinflammatories.  Follow metabolic panel.  

## 2021-12-23 NOTE — Assessment & Plan Note (Signed)
Continue to weigh daily.  Feels breathing is better.  No evidence of volume overload today. Follow.

## 2021-12-23 NOTE — Assessment & Plan Note (Signed)
On metoprolol and diltiazem.   Follow pressures.

## 2021-12-23 NOTE — Assessment & Plan Note (Signed)
Upper symptoms controlled.  On protonix.  

## 2021-12-23 NOTE — Assessment & Plan Note (Signed)
Followed by cardiology 

## 2021-12-23 NOTE — Assessment & Plan Note (Signed)
On thyroid replacement.  Follow tsh.  

## 2021-12-23 NOTE — Assessment & Plan Note (Signed)
Unable to take statin medication.  Follow lipid panel.  

## 2021-12-23 NOTE — Assessment & Plan Note (Signed)
Dr Fletcher Anon (12/13/21) - decreased diltiazem to '180mg'$  q day.  Lower extremity swelling has improved.  Overall heart stable.

## 2021-12-23 NOTE — Assessment & Plan Note (Signed)
Followed by GI.  Stable.  Follow liver function tests.  

## 2021-12-23 NOTE — Assessment & Plan Note (Signed)
Follow liver function tests.  Followed by GI.  

## 2021-12-25 DIAGNOSIS — K745 Biliary cirrhosis, unspecified: Secondary | ICD-10-CM | POA: Diagnosis not present

## 2021-12-27 ENCOUNTER — Other Ambulatory Visit: Payer: Self-pay

## 2021-12-27 DIAGNOSIS — E039 Hypothyroidism, unspecified: Secondary | ICD-10-CM

## 2021-12-27 DIAGNOSIS — N1832 Chronic kidney disease, stage 3b: Secondary | ICD-10-CM

## 2021-12-27 MED ORDER — LEVOTHYROXINE SODIUM 125 MCG PO TABS: 125.0000 ug | ORAL_TABLET | Freq: Every day | ORAL | 3 refills | Status: DC

## 2021-12-28 ENCOUNTER — Other Ambulatory Visit: Payer: Self-pay | Admitting: Internal Medicine

## 2021-12-28 ENCOUNTER — Other Ambulatory Visit
Admission: RE | Admit: 2021-12-28 | Discharge: 2021-12-28 | Disposition: A | Discharge status: Home / Self Care | Payer: Medicare Other | Attending: Internal Medicine | Admitting: Internal Medicine

## 2021-12-28 DIAGNOSIS — E875 Hyperkalemia: Secondary | ICD-10-CM | POA: Diagnosis not present

## 2021-12-28 LAB — POTASSIUM: Potassium: 5.5 mmol/L — ABNORMAL HIGH (ref 3.5–5.1)

## 2021-12-28 NOTE — Progress Notes (Signed)
Order placed for stat potassium to be drawn at [facility] 

## 2021-12-31 ENCOUNTER — Ambulatory Visit: Payer: Medicare Other | Attending: Cardiology

## 2021-12-31 DIAGNOSIS — I495 Sick sinus syndrome: Secondary | ICD-10-CM | POA: Insufficient documentation

## 2021-12-31 DIAGNOSIS — Z950 Presence of cardiac pacemaker: Secondary | ICD-10-CM | POA: Insufficient documentation

## 2022-01-01 LAB — CUP PACEART REMOTE DEVICE CHECK
Battery Remaining Longevity: 95 mo
Battery Remaining Percentage: 95.5 %
Battery Voltage: 3.01 V
Brady Statistic AP VP Percent: 0 %
Brady Statistic AP VS Percent: 0 %
Brady Statistic AS VP Percent: 86 %
Brady Statistic AS VS Percent: 13 %
Brady Statistic RA Percent Paced: 0 %
Brady Statistic RV Percent Paced: 30 %
Date Time Interrogation Session: 20231023020019
Implantable Lead Connection Status: 753985
Implantable Lead Connection Status: 753985
Implantable Lead Implant Date: 20230721
Implantable Lead Implant Date: 20230721
Implantable Lead Location: 753859
Implantable Lead Location: 753860
Implantable Pulse Generator Implant Date: 20230721
Lead Channel Impedance Value: 480 Ohm
Lead Channel Impedance Value: 490 Ohm
Lead Channel Pacing Threshold Amplitude: 0.75 V
Lead Channel Pacing Threshold Pulse Width: 0.5 ms
Lead Channel Sensing Intrinsic Amplitude: 11.4 mV
Lead Channel Sensing Intrinsic Amplitude: 2 mV
Lead Channel Setting Pacing Amplitude: 2.5 V
Lead Channel Setting Pacing Amplitude: 3.5 V
Lead Channel Setting Pacing Pulse Width: 0.5 ms
Lead Channel Setting Sensing Sensitivity: 2 mV
Pulse Gen Model: 2272
Pulse Gen Serial Number: 8087042

## 2022-01-02 ENCOUNTER — Telehealth: Payer: Self-pay

## 2022-01-02 NOTE — Telephone Encounter (Signed)
Noted, K food list mailed to pt for her review.

## 2022-01-02 NOTE — Telephone Encounter (Signed)
Patient states she is returning our call.  I read Dr. Randell Patient Scott's message to patient.  I spoke with Everrett Coombe, CMA, and he states he will mail patient information on foods with increased potassium.  I relayed message to patient and verified that the address we have on file for patient is correct.

## 2022-01-02 NOTE — Telephone Encounter (Signed)
Lvm for pt to return call in regards to lab results.  Per Dr.Scott: Please notify Ruth Roth that her potassium remains elevated.  (Slightly improved from last check, but sill elevated).  Continue to avoid foods with increased potassium.  Make sure she has this information.  Also, she has seen Dr Holley Raring for her kidneys.  (Dr Holley Raring - [facility]).  He is aware of her potassium.  Please schedule a f/u appt with him to f/u regarding her elevated potassium.  He is aware and suggested f/u appt.

## 2022-01-03 ENCOUNTER — Ambulatory Visit
Admission: RE | Admit: 2022-01-03 | Discharge: 2022-01-03 | Disposition: A | Discharge status: Home / Self Care | Payer: Medicare Other | Source: Ambulatory Visit | Attending: Gastroenterology | Admitting: Gastroenterology

## 2022-01-03 DIAGNOSIS — K838 Other specified diseases of biliary tract: Secondary | ICD-10-CM | POA: Diagnosis not present

## 2022-01-03 DIAGNOSIS — K743 Primary biliary cirrhosis: Secondary | ICD-10-CM | POA: Insufficient documentation

## 2022-01-03 DIAGNOSIS — Z9049 Acquired absence of other specified parts of digestive tract: Secondary | ICD-10-CM | POA: Diagnosis not present

## 2022-01-03 DIAGNOSIS — K7460 Unspecified cirrhosis of liver: Secondary | ICD-10-CM | POA: Diagnosis not present

## 2022-01-09 DIAGNOSIS — H401132 Primary open-angle glaucoma, bilateral, moderate stage: Secondary | ICD-10-CM | POA: Diagnosis not present

## 2022-01-11 ENCOUNTER — Telehealth: Payer: Self-pay | Admitting: Cardiology

## 2022-01-11 NOTE — Telephone Encounter (Signed)
Patient is returning call regarding remote PPM check.

## 2022-01-11 NOTE — Telephone Encounter (Signed)
Spoke with patient, looks like Otila Kluver called based off remote transmission from Dr. Quentin Ore who advised patient to go to AF clinic for rate control patient agreeable informed patient AF clinic would reach and schedule, patient voiced understanding

## 2022-01-17 ENCOUNTER — Ambulatory Visit
Admission: RE | Admit: 2022-01-17 | Discharge: 2022-01-17 | Disposition: A | Discharge status: Home / Self Care | Payer: Medicare Other | Source: Ambulatory Visit | Attending: Internal Medicine | Admitting: Internal Medicine

## 2022-01-17 DIAGNOSIS — Z1231 Encounter for screening mammogram for malignant neoplasm of breast: Secondary | ICD-10-CM | POA: Insufficient documentation

## 2022-01-20 ENCOUNTER — Other Ambulatory Visit: Payer: Self-pay | Admitting: Nurse Practitioner

## 2022-01-21 ENCOUNTER — Telehealth: Payer: Self-pay | Admitting: Cardiovascular Disease

## 2022-01-21 MED ORDER — RIVAROXABAN 15 MG PO TABS: ORAL_TABLET | ORAL | 1 refills | Status: DC

## 2022-01-21 NOTE — Telephone Encounter (Signed)
Refill request for Xarelto  metoprolol tartrate (LOPRESSOR) 100 MG tablet 180 tablet 1 01/21/2022    Sig: TAKE 1 TABLET(100 MG) BY MOUTH TWICE DAILY   Sent to pharmacy as: metoprolol tartrate (LOPRESSOR) 100 MG tablet   Notes to Pharmacy: **Patient requests 90 days supply**   E-Prescribing Status: Receipt confirmed by pharmacy (01/21/2022  8:40 AM EST)    Pharmacy  [id number] - [place], [address]

## 2022-01-21 NOTE — Telephone Encounter (Signed)
Prescription refill request for Xarelto received.  Indication: AF Last office visit:  12/13/21  Rod Can MD Weight: 61.5kg Age: 88 Scr: 1.13 on 12/19/21 CrCl: 31.48  Continue Low dose Xarelto '15mg'$  daily as currently taking due to baseline Scr and age. Refill approved.

## 2022-01-21 NOTE — Telephone Encounter (Signed)
See below

## 2022-01-21 NOTE — Telephone Encounter (Signed)
*  STAT* If patient is at the pharmacy, call can be transferred to refill team.   1. Which medications need to be refilled? (please list name of each medication and dose if known) XARELTO 15 MG TABS tablet metoprolol tartrate (LOPRESSOR) 100 MG tablet   2. Which pharmacy/location (including street and city if local pharmacy) is medication to be sent to? [facility] [place], [address] AT [facility] [address]   3. Do they need a 30 day or 90 day supply? [place]

## 2022-01-23 ENCOUNTER — Ambulatory Visit (HOSPITAL_COMMUNITY)
Admission: RE | Admit: 2022-01-23 | Discharge: 2022-01-23 | Disposition: A | Discharge status: Home / Self Care | Payer: Medicare Other | Source: Ambulatory Visit | Attending: Physician Assistant | Admitting: Physician Assistant

## 2022-01-23 ENCOUNTER — Encounter (HOSPITAL_COMMUNITY): Payer: Self-pay | Admitting: Physician Assistant

## 2022-01-23 VITALS — BP 138/100 | HR 115 | Ht 62.0 in | Wt 139.0 lb

## 2022-01-23 DIAGNOSIS — I130 Hypertensive heart and chronic kidney disease with heart failure and stage 1 through stage 4 chronic kidney disease, or unspecified chronic kidney disease: Secondary | ICD-10-CM | POA: Diagnosis not present

## 2022-01-23 DIAGNOSIS — K754 Autoimmune hepatitis: Secondary | ICD-10-CM | POA: Diagnosis not present

## 2022-01-23 DIAGNOSIS — N189 Chronic kidney disease, unspecified: Secondary | ICD-10-CM | POA: Insufficient documentation

## 2022-01-23 DIAGNOSIS — K279 Peptic ulcer, site unspecified, unspecified as acute or chronic, without hemorrhage or perforation: Secondary | ICD-10-CM | POA: Insufficient documentation

## 2022-01-23 DIAGNOSIS — I2510 Atherosclerotic heart disease of native coronary artery without angina pectoris: Secondary | ICD-10-CM | POA: Diagnosis not present

## 2022-01-23 DIAGNOSIS — I5032 Chronic diastolic (congestive) heart failure: Secondary | ICD-10-CM | POA: Diagnosis not present

## 2022-01-23 DIAGNOSIS — D6869 Other thrombophilia: Secondary | ICD-10-CM | POA: Insufficient documentation

## 2022-01-23 DIAGNOSIS — I10 Essential (primary) hypertension: Secondary | ICD-10-CM | POA: Insufficient documentation

## 2022-01-23 DIAGNOSIS — Z950 Presence of cardiac pacemaker: Secondary | ICD-10-CM | POA: Insufficient documentation

## 2022-01-23 DIAGNOSIS — I4821 Permanent atrial fibrillation: Secondary | ICD-10-CM

## 2022-01-23 DIAGNOSIS — I495 Sick sinus syndrome: Secondary | ICD-10-CM | POA: Diagnosis not present

## 2022-01-23 MED ORDER — METOPROLOL TARTRATE 100 MG PO TABS: 150.0000 mg | ORAL_TABLET | Freq: Two times a day (BID) | ORAL | 1 refills | Status: DC

## 2022-01-23 NOTE — Patient Instructions (Signed)
Increase metoprolol to '150mg'$  twice a day

## 2022-01-23 NOTE — Progress Notes (Signed)
Primary Care Physician: Einar Pheasant, MD Primary Cardiologist: Dr Fletcher Anon Primary Electrophysiologist: Dr Quentin Ore [facility]: Dr Haroldine Laws Referring Physician: Dr Jenny Reichmann is a 88 y.o. female with a history of hypertension, tachybradycardia syndrome, chronic kidney disease, HFpEF, peptic ulcer disease, autoimmune hepatitis with cirrhosis, neuropathy, CAD, hypothyroidism, atrial fibrillation who presents for consultation in the [facility].  The patient is s/p multiple cardioversions on amiodarone but failed to maintain SR. She has been in permanent atrial fibrillation. Patient is on Xarelto for a CHADS2VASC score of 6. She underwent PPM implant 09/28/21 for tachybradycardia syndrome.   On follow up today, patient was noted to have suboptimal rate control on her last device interrogation. She does notice her heart racing at times and is fatigued. No bleeding issues on anticoagulation.   Today, she denies symptoms of chest pain, shortness of breath, orthopnea, PND, lower extremity edema, dizziness, presyncope, syncope, snoring, daytime somnolence, bleeding, or neurologic sequela. The patient is tolerating medications without difficulties and is otherwise without complaint today.    Atrial Fibrillation Risk Factors:  she does not have symptoms or diagnosis of sleep apnea. she does not have a history of rheumatic fever.   she has a BMI of Body mass index is 25.42 kg/m.Marland Kitchen Filed Weights   01/23/22 1439  Weight: 63 kg    Family History  Problem Relation Age of Onset   Stroke Mother    Esophageal cancer Brother        also had lung cancer   Ovarian cancer Daughter    Colon cancer Daughter    Breast cancer Neg Hx      Atrial Fibrillation Management history:  Previous antiarrhythmic drugs: amiodarone  Previous cardioversions: 12/26/16, 05/16/17, 06/09/17, 11/30/18 Previous ablations: none CHADS2VASC score: 6 Anticoagulation history:  Xarelto   Past Medical History:  Diagnosis Date   (HFpEF) heart failure with preserved ejection fraction ([place])    a. 07/2017 Echo: EF 50-55%, no rwma, mild AS/AI, mod MR, mod dil LA, mod TR, PASP 88mHg; b. 03/2021 Echo: EF 60-65%. No rwma. Nl RV fxn. Sev BAE. Mild MR. Mild AI/AS.   Autoimmune hepatitis ([place]    followed by Dr SGustavo Lah  Fibrocystic breast disease    Glaucoma    Hypercholesterolemia    Hypertension    Hypothyroidism    Inflammatory arthritis    [place]) [place]    Mitral regurgitation    a. 07/2017 Echo: Mod MR; b. 08/2017 Cath: 4+/Severe MR; c. 08/2017 TEE: Mild MR; d. 03/2021 Echo: Mild MR.   Neuropathy    Non-obstructive Coronary artery disease    a. 05/2014 NSTEMI/Cath: LM nl, LAD mild dzs, LCX 60ost hazy (? culprit), RCA mild dzs. EF 60% by echo-->Med Rx; b. 08/2017 Cath: LM 30ost, LAD min irregs, LCX small, 40ost/p, RCA large, min irregs, RPDA/RPL1 nl, EF 50-55%. 4+MR.   Osteoarthritis    Permanent atrial fibrillation (HCC)    a. CHA2DS2VASc = 6-->Xarelto.   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   Past Surgical History:  Procedure Laterality Date   ABDOMINAL HYSTERECTOMY  1963   partial, secondary to fibroids   APPENDECTOMY     Billroth     CARDIAC CATHETERIZATION  05/12/14   [place]  CARDIOVERSION N/A 12/26/2016   Procedure: CARDIOVERSION;  Surgeon: AWellington Hampshire MD;  Location: [facility];  Service: Cardiovascular;  Laterality: N/A;   CARDIOVERSION N/A 05/16/2017  Procedure: CARDIOVERSION;  Surgeon: [name], MD;  Location: [facility];  Service: Cardiovascular;  Laterality: N/A;   CARDIOVERSION N/A 06/09/2017   Procedure: CARDIOVERSION;  Surgeon: [name], MD;  Location: [facility];  Service: Cardiovascular;  Laterality: N/A;   CARDIOVERSION N/A 11/30/2018   Procedure: CARDIOVERSION;  Surgeon: [name], MD;  Location: [facility];  Service: Cardiovascular;  Laterality: N/A;    CHOLECYSTECTOMY  1998   COLONOSCOPY  2009   PACEMAKER IMPLANT N/A 09/28/2021   Procedure: PACEMAKER IMPLANT;  Surgeon: Vickie Epley, MD;  Location: [place] [facility];  Service: Cardiovascular;  Laterality: N/A;   RIGHT/LEFT HEART CATH AND CORONARY ANGIOGRAPHY Bilateral 08/21/2017   Procedure: RIGHT/LEFT HEART CATH AND CORONARY ANGIOGRAPHY;  Surgeon: [name], MD;  Location: [place] [facility];  Service: Cardiovascular;  Laterality: Bilateral;   TEE WITHOUT CARDIOVERSION N/A 08/25/2017   Procedure: TRANSESOPHAGEAL ECHOCARDIOGRAM (TEE);  Surgeon: Jolaine Artist, MD;  Location: [facility];  Service: Cardiovascular;  Laterality: N/A;   UPPER GI ENDOSCOPY  2004    Current Outpatient Medications  Medication Sig Dispense Refill   allopurinol (ZYLOPRIM) 100 MG tablet TAKE 1/2 TABLET(50 MG) BY MOUTH DAILY 45 tablet 1   diltiazem (CARDIZEM CD) 180 MG 24 hr capsule Take 1 capsule (180 mg total) by mouth daily. 90 capsule 2   dorzolamide (TRUSOPT) 2 % ophthalmic solution Place 1 drop into the left eye 2 (two) times daily.     fluticasone (FLONASE) 50 MCG/ACT nasal spray Place 2 sprays into both nostrils daily as needed for allergies.     gabapentin (NEURONTIN) 300 MG capsule One capsule tid and two capsules q hs. 150 capsule 2   hydroxychloroquine (PLAQUENIL) 200 MG tablet Take 200 mg by mouth every other day.     ipratropium (ATROVENT) 0.03 % nasal spray Place 2 sprays into both nostrils 3 (three) times daily as needed for rhinitis.     latanoprost (XALATAN) 0.005 % ophthalmic solution Place 1 drop into both eyes at bedtime.      levothyroxine (SYNTHROID) 125 MCG tablet Take 1 tablet (125 mcg total) by mouth daily. 90 tablet 3   Multiple Vitamin (MULTI-VITAMIN) tablet Take 1 tablet by mouth at bedtime.      Probiotic Product (ALIGN) 4 MG CAPS Take one capsule q day 30 capsule 0   Rivaroxaban (XARELTO) 15 MG TABS tablet TAKE 1 TABLET(15 MG) BY MOUTH DAILY WITH SUPPER 90 tablet 1    torsemide (DEMADEX) 20 MG tablet Take 1 tablet (20 mg total) by mouth every other day. 45 tablet 3   traMADol (ULTRAM) 50 MG tablet Take 1 tablet (50 mg total) by mouth 2 (two) times daily as needed (for pain.). 60 tablet 1   ursodiol (ACTIGALL) 300 MG capsule Take 300-600 mg by mouth See admin instructions. Take 2 capsules (600 mg) daily in the morning   & 1 capsule (300 mg) at night.     vitamin B-12 (CYANOCOBALAMIN) 500 MCG tablet Take 500 mcg by mouth every Monday.     metoprolol tartrate (LOPRESSOR) 100 MG tablet Take 1.5 tablets (150 mg total) by mouth 2 (two) times daily. 180 tablet 1   No current facility-administered medications for this encounter.    Allergies  Allergen Reactions   Statins Other (See Comments)    Liver damage    Hydroxychloroquine Hives   Haldol [Haloperidol Lactate] Rash   Librax [Chlordiazepoxide-Clidinium] Rash   Plavix [Clopidogrel Bisulfate] Rash   Ramipril Rash  Social History   Socioeconomic History   Marital status: Widowed    Spouse name: Not on file   Number of children: 2   Years of education: Not on file   Highest education level: Not on file  Occupational History   Occupation: retired  Tobacco Use   Smoking status: Never   Smokeless tobacco: Never   Tobacco comments:    Never smoke 01/23/22  Vaping Use   Vaping Use: Never used  Substance and Sexual Activity   Alcohol use: No    Alcohol/week: 0.0 standard drinks of alcohol   Drug use: No   Sexual activity: Never  Other Topics Concern   Not on file  Social History Narrative   Not on file   Social Determinants of Health   Financial Resource Strain: Low Risk  (05/28/2021)   Overall Financial Resource Strain (CARDIA)    Difficulty of Paying Living Expenses: Not hard at all  Food Insecurity: No Food Insecurity (05/28/2021)   Hunger Vital Sign    Worried About Running Out of Food in the Last Year: Never true    [place] in the Last Year: Never true  Transportation  Needs: No Transportation Needs (05/28/2021)   PRAPARE - Hydrologist (Medical): No    Lack of Transportation (Non-Medical): No  Physical Activity: Insufficiently Active (05/28/2021)   Exercise Vital Sign    Days of Exercise per Week: 3 days    Minutes of Exercise per Session: 40 min  Stress: No Stress Concern Present (05/28/2021)   [place]    Feeling of Stress : Not at all  Social Connections: Unknown (05/28/2021)   Social Connection and Isolation Panel [NHANES]    Frequency of Communication with Friends and Family: More than three times a week    Frequency of Social Gatherings with Friends and Family: More than three times a week    Attends Religious Services: Not on file    Active Member of Clubs or Organizations: Not on file    Attends Archivist Meetings: Not on file    Marital Status: Not on file  Intimate Partner Violence: Not At Risk (05/28/2021)   Humiliation, Afraid, Rape, and Kick questionnaire    Fear of Current or Ex-Partner: No    Emotionally Abused: No    Physically Abused: No    Sexually Abused: No     ROS- All systems are reviewed and negative except as per the HPI above.  Physical Exam: Vitals:   01/23/22 1439  BP: (!) 138/100  Pulse: (!) 115  Weight: 63 kg  Height: '5\' 2"'$  (1.575 m)    GEN- The patient is a well appearing elderly female, alert and oriented x 3 today.   Head- normocephalic, atraumatic Eyes-  Sclera clear, conjunctiva pink Ears- hearing intact Oropharynx- clear Neck- supple  Lungs- Clear to ausculation bilaterally, normal work of breathing Heart- Regular rate and rhythm, tachycardia, no murmurs, rubs or gallops  GI- soft, NT, ND, + BS Extremities- no clubbing, cyanosis, or edema MS- no significant deformity or atrophy Skin- no rash or lesion Psych- euthymic mood, full affect Neuro- strength and sensation are intact  Wt Readings from  Last 3 Encounters:  01/23/22 63 kg  12/19/21 62.4 kg  12/13/21 61.5 kg    EKG today demonstrates  Atypical flutter with 2:1 block Vent. rate 115 BPM PR interval * ms QRS duration 84 ms QT/QTcB 354/489 ms  Echo 03/16/21 demonstrated   1. Left ventricular ejection fraction, by estimation, is 60 to 65%. The  left ventricle has normal function. The left ventricle has no regional  wall motion abnormalities. Left ventricular diastolic function could not  be evaluated.   2. Right ventricular systolic function is normal. The right ventricular  size is normal.   3. Left atrial size was severely dilated.   4. Right atrial size was severely dilated.   5. The mitral valve is degenerative. Mild mitral valve regurgitation. No  evidence of mitral stenosis. Moderate mitral annular calcification.   6. The non-coronary cusp appears fused. The aortic valve is tricuspid.  There is severe calcifcation of the aortic valve. Aortic valve  regurgitation is mild. Mild aortic valve stenosis.   7. The inferior vena cava is normal in size with greater than 50%  respiratory variability, suggesting right atrial pressure of 3 mmHg.   Epic records are reviewed at length today  CHA2DS2-VASc Score = 6  The patient's score is based upon: CHF History: 1 HTN History: 1 Diabetes History: 0 Stroke History: 0 Vascular Disease History: 1 Age Score: 2 Gender Score: 1       ASSESSMENT AND PLAN: 1. Permanent Atrial Fibrillation (ICD10:  I48.11) The patient's CHA2DS2-VASc score is 6, indicating a 9.7% annual risk of stroke.   Patient in permanent atrial fibrillation. HR >110 bpm 45% of the time on device interrogation.  We discussed options for rate control. She is hesitant to increase diltiazem as she had lower extremity edema at higher doses. Will increase Lopressor to 150 mg BID Continue Xarelto 15 mg daily  2. Secondary Hypercoagulable State (ICD10:  D68.69) The patient is at significant risk for  stroke/thromboembolism based upon her CHA2DS2-VASc Score of 6.  Continue Rivaroxaban (Xarelto).   3. Tachybradycardia syndrome S/p PPM, followed by Dr Quentin Ore and the device clinic.  4. HTN Diastolic elevated today, increase BB as above.  5. CAD No anginal symptoms. Followed by Dr Fletcher Anon    Follow up in the AF clinic in 3 weeks.    [facility] [address] [place], [place] [address] [phone] 01/23/2022 4:23 PM

## 2022-01-30 IMAGING — DX DG CHEST 2V
2 series · 2 of 2 positions shown · non-contrast
Comparison: PA and lateral chest 08/22/2017 and 05/10/2014.

CLINICAL DATA: Shortness of breath on exertion.

EXAM:
CHEST - 2 VIEW

[chest pa]
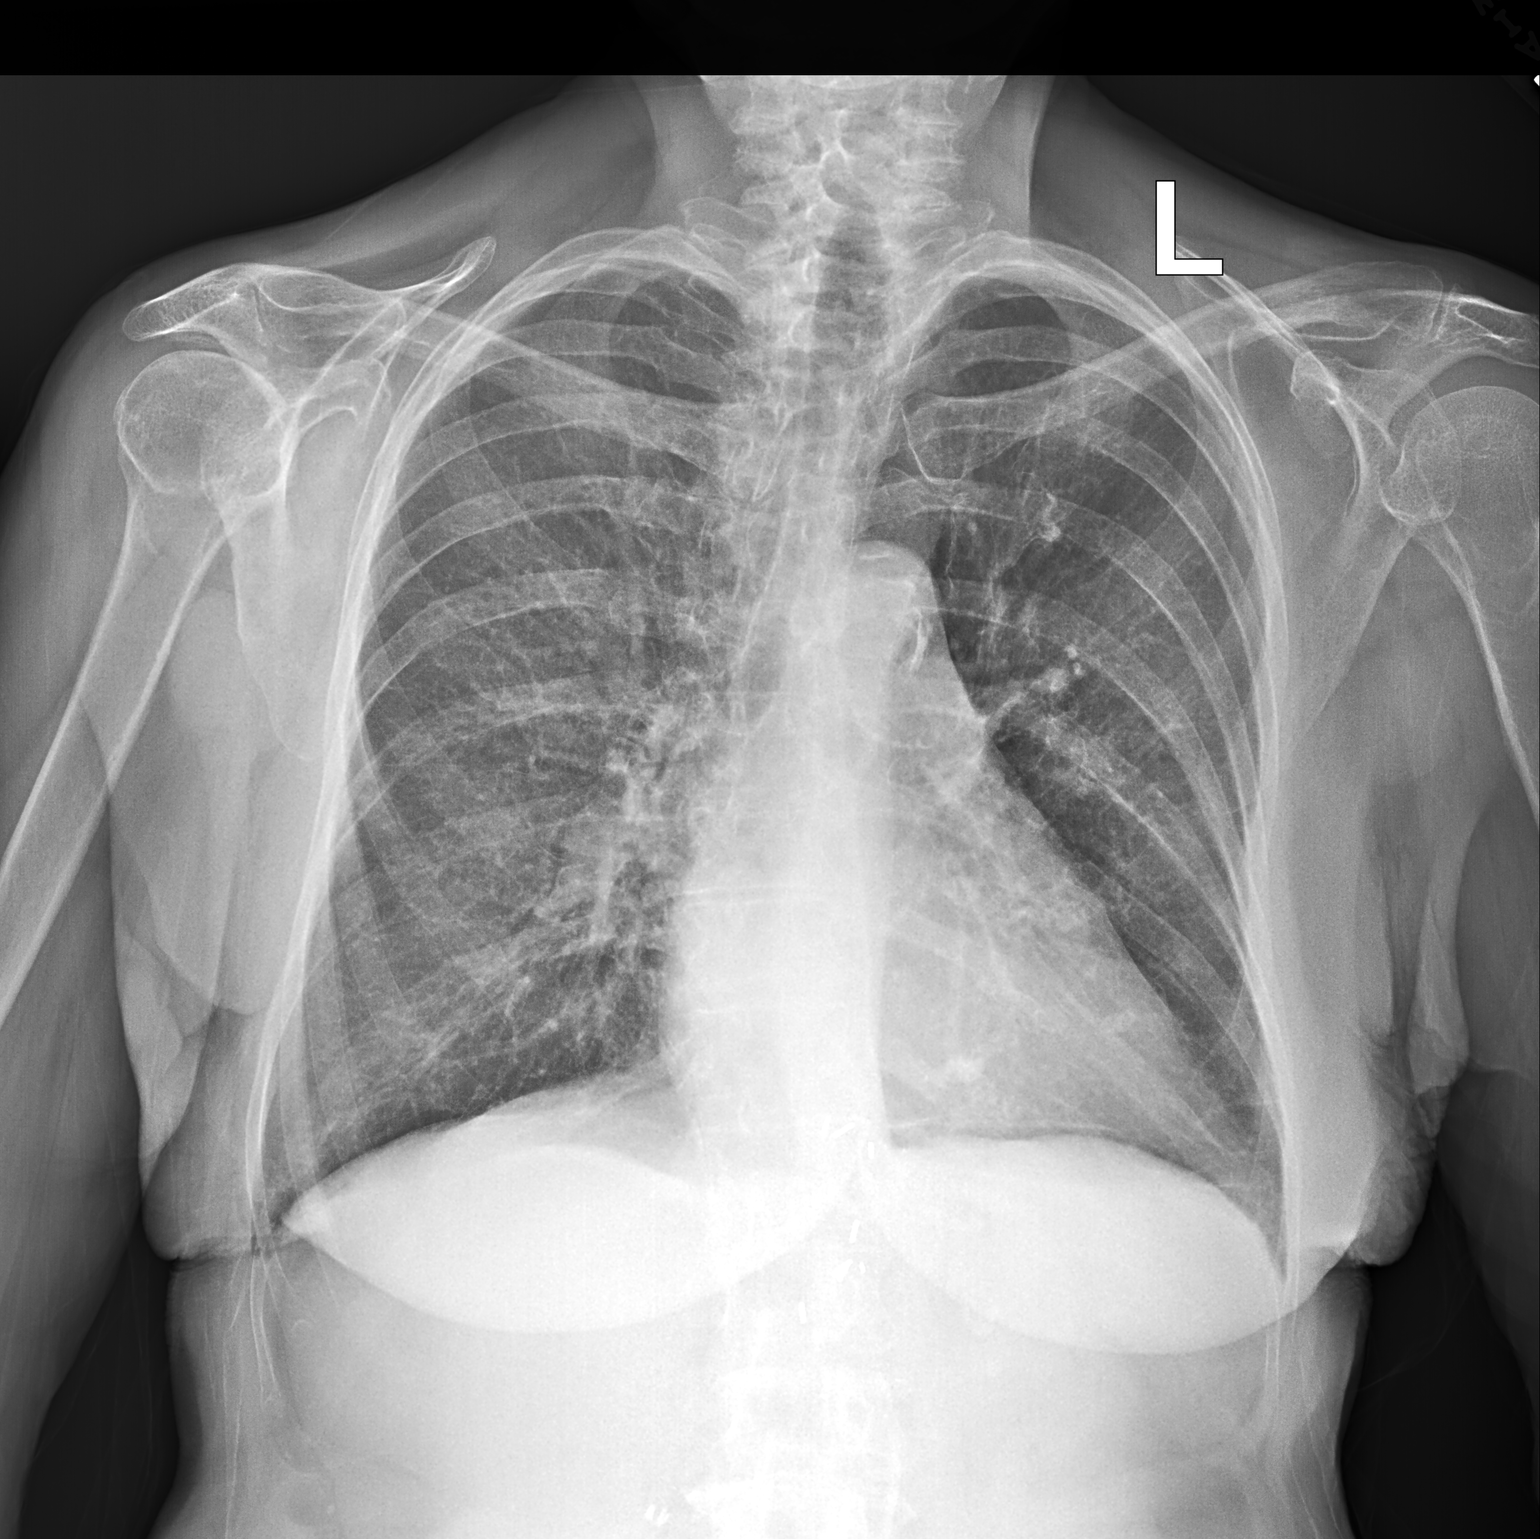

[chest lat]
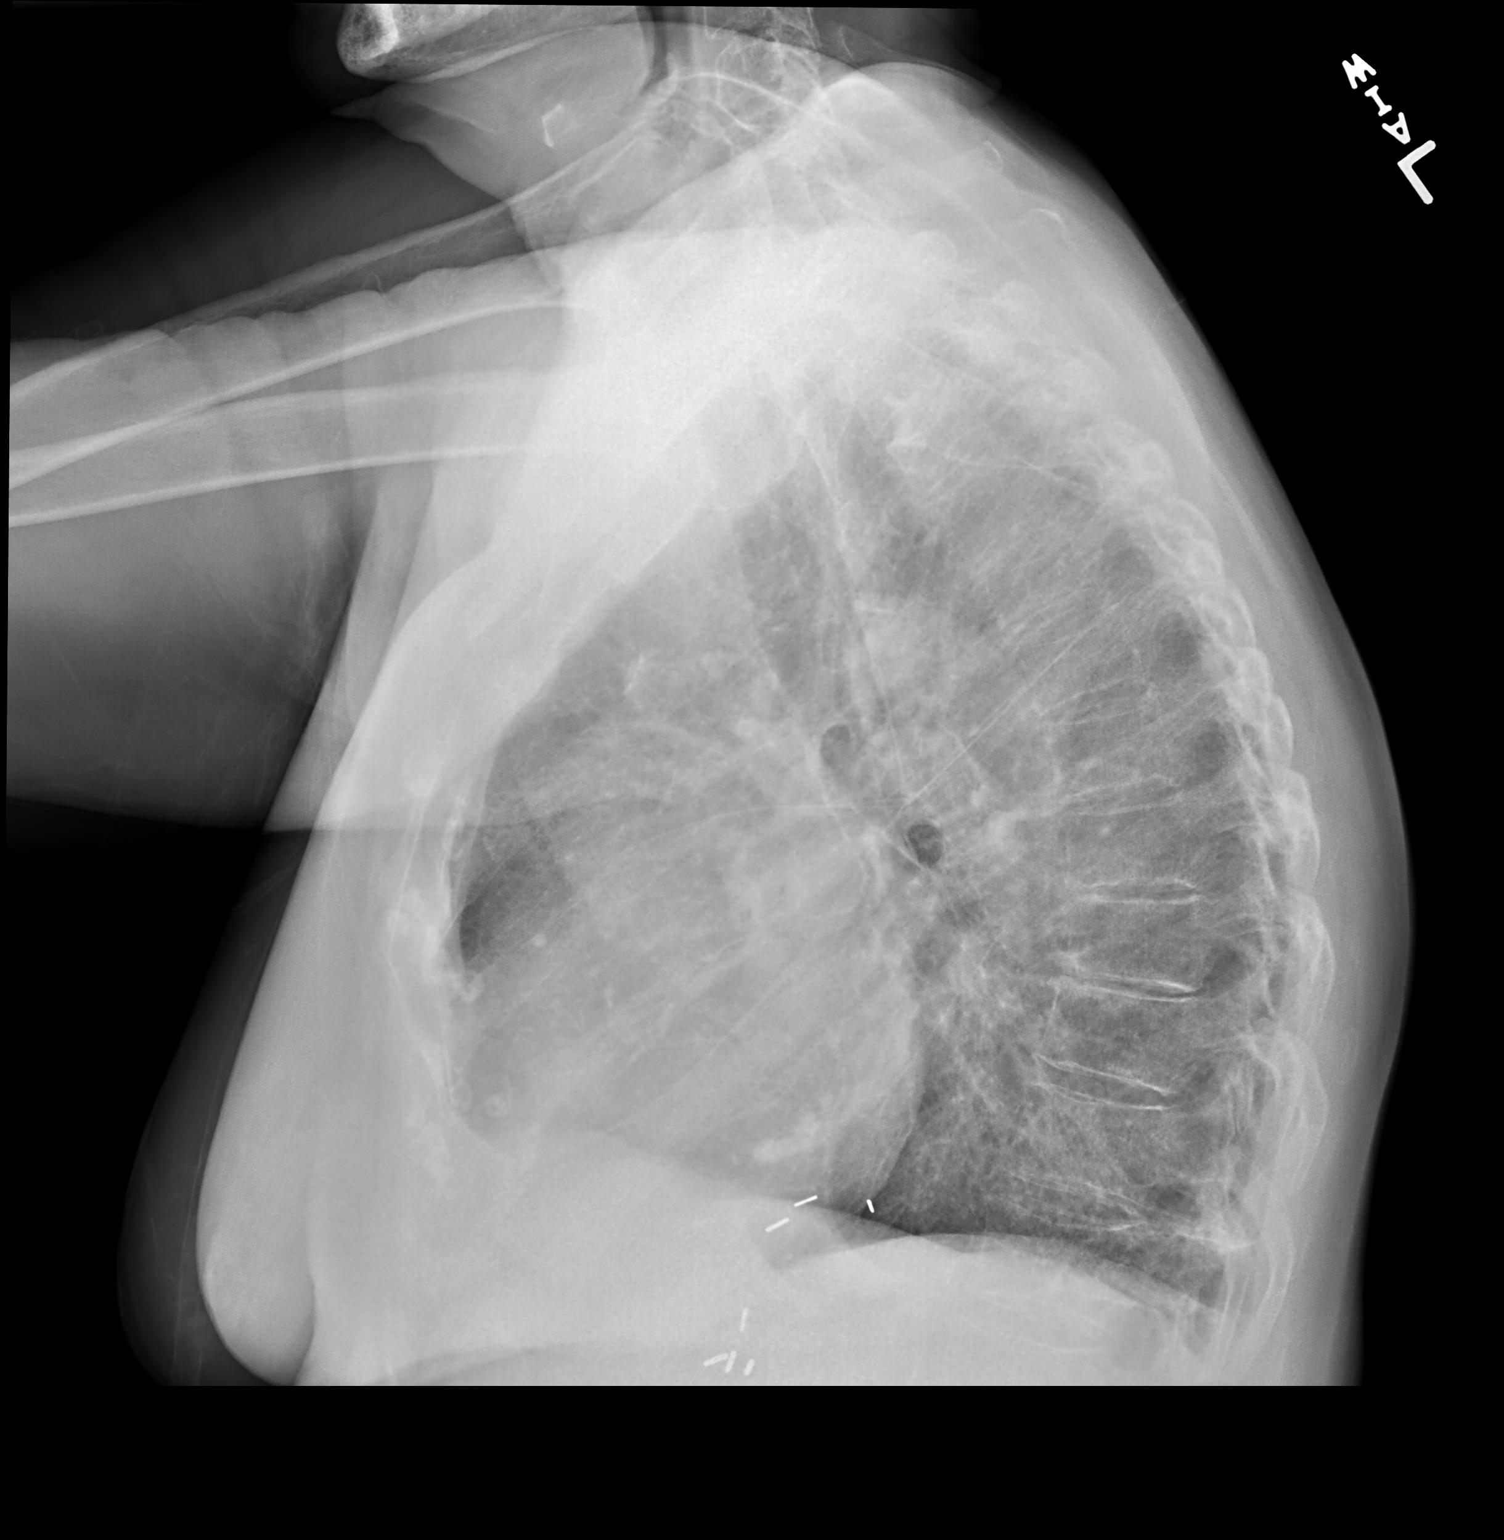

[2 of 2 positions shown; findings below may reference images not displayed]

FINDINGS: The lungs are clear. Heart size is normal. Aortic atherosclerosis.
No pneumothorax or pleural effusion. Surgical clips at the GE
junction noted.
IMPRESSION: No acute disease.

Aortic Atherosclerosis ([id number]).

## 2022-02-04 NOTE — Progress Notes (Signed)
Remote pacemaker transmission.   

## 2022-02-08 DIAGNOSIS — H401132 Primary open-angle glaucoma, bilateral, moderate stage: Secondary | ICD-10-CM | POA: Diagnosis not present

## 2022-02-11 ENCOUNTER — Other Ambulatory Visit (INDEPENDENT_AMBULATORY_CARE_PROVIDER_SITE_OTHER): Payer: Medicare Other

## 2022-02-11 DIAGNOSIS — E039 Hypothyroidism, unspecified: Secondary | ICD-10-CM | POA: Diagnosis not present

## 2022-02-11 DIAGNOSIS — N1832 Chronic kidney disease, stage 3b: Secondary | ICD-10-CM | POA: Diagnosis not present

## 2022-02-11 LAB — HEPATIC FUNCTION PANEL
ALT: 15 U/L (ref 0–35)
AST: 13 U/L (ref 0–37)
Albumin: 4 g/dL (ref 3.5–5.2)
Alkaline Phosphatase: 138 U/L — ABNORMAL HIGH (ref 39–117)
Bilirubin, Direct: 0.1 mg/dL (ref 0.0–0.3)
Total Bilirubin: 0.4 mg/dL (ref 0.2–1.2)
Total Protein: 6.3 g/dL (ref 6.0–8.3)

## 2022-02-11 LAB — TSH: TSH: 6.15 u[IU]/mL — ABNORMAL HIGH (ref 0.35–5.50)

## 2022-02-13 ENCOUNTER — Other Ambulatory Visit: Payer: Self-pay | Admitting: Internal Medicine

## 2022-02-13 DIAGNOSIS — I5032 Chronic diastolic (congestive) heart failure: Secondary | ICD-10-CM

## 2022-02-13 DIAGNOSIS — E7800 Pure hypercholesterolemia, unspecified: Secondary | ICD-10-CM

## 2022-02-13 DIAGNOSIS — K754 Autoimmune hepatitis: Secondary | ICD-10-CM

## 2022-02-13 DIAGNOSIS — N1832 Chronic kidney disease, stage 3b: Secondary | ICD-10-CM

## 2022-02-13 DIAGNOSIS — E039 Hypothyroidism, unspecified: Secondary | ICD-10-CM

## 2022-02-13 NOTE — Progress Notes (Signed)
Orders placed for future labs.

## 2022-02-14 ENCOUNTER — Ambulatory Visit (HOSPITAL_COMMUNITY)
Admission: RE | Admit: 2022-02-14 | Discharge: 2022-02-14 | Disposition: A | Discharge status: Home / Self Care | Payer: Medicare Other | Source: Ambulatory Visit | Attending: Physician Assistant | Admitting: Physician Assistant

## 2022-02-14 ENCOUNTER — Encounter (HOSPITAL_COMMUNITY): Payer: Self-pay | Admitting: Physician Assistant

## 2022-02-14 VITALS — BP 128/80 | HR 85 | Ht 62.0 in | Wt 136.4 lb

## 2022-02-14 DIAGNOSIS — N189 Chronic kidney disease, unspecified: Secondary | ICD-10-CM | POA: Insufficient documentation

## 2022-02-14 DIAGNOSIS — I495 Sick sinus syndrome: Secondary | ICD-10-CM | POA: Insufficient documentation

## 2022-02-14 DIAGNOSIS — I4811 Longstanding persistent atrial fibrillation: Secondary | ICD-10-CM | POA: Diagnosis not present

## 2022-02-14 DIAGNOSIS — Z7901 Long term (current) use of anticoagulants: Secondary | ICD-10-CM | POA: Diagnosis not present

## 2022-02-14 DIAGNOSIS — I4821 Permanent atrial fibrillation: Secondary | ICD-10-CM | POA: Diagnosis not present

## 2022-02-14 DIAGNOSIS — I2510 Atherosclerotic heart disease of native coronary artery without angina pectoris: Secondary | ICD-10-CM | POA: Insufficient documentation

## 2022-02-14 DIAGNOSIS — I129 Hypertensive chronic kidney disease with stage 1 through stage 4 chronic kidney disease, or unspecified chronic kidney disease: Secondary | ICD-10-CM | POA: Diagnosis not present

## 2022-02-14 DIAGNOSIS — D6869 Other thrombophilia: Secondary | ICD-10-CM

## 2022-02-14 NOTE — Progress Notes (Signed)
Primary Care Physician: Einar Pheasant, MD Primary Cardiologist: Dr Fletcher Anon Primary Electrophysiologist: Dr Quentin Ore [facility]: Dr Haroldine Laws Referring Physician: Dr Jenny Reichmann is a 88 y.o. female with a history of hypertension, tachybradycardia syndrome, chronic kidney disease, HFpEF, peptic ulcer disease, autoimmune hepatitis with cirrhosis, neuropathy, CAD, hypothyroidism, atrial fibrillation who presents for consultation in the [facility].  The patient is s/p multiple cardioversions on amiodarone but failed to maintain SR. She has been in permanent atrial fibrillation. Patient is on Xarelto for a CHADS2VASC score of 6. She underwent PPM implant 09/28/21 for tachybradycardia syndrome.   On follow up today, patient reports that she feels about the same. She is "sleepy" but his has been chronic for her. He checks her heart rate which shows 100-110s with activity but slows to under 100 with rest. No bleeding issues on anticoagulation.   Today, she denies symptoms of palpitations, chest pain, orthopnea, PND, lower extremity edema, dizziness, presyncope, syncope, snoring, daytime somnolence, bleeding, or neurologic sequela. The patient is tolerating medications without difficulties and is otherwise without complaint today.    Atrial Fibrillation Risk Factors:  she does not have symptoms or diagnosis of sleep apnea. she does not have a history of rheumatic fever.   she has a BMI of Body mass index is 24.95 kg/m.Marland Kitchen Filed Weights   02/14/22 1326  Weight: 61.9 kg    Family History  Problem Relation Age of Onset   Stroke Mother    Esophageal cancer Brother        also had lung cancer   Ovarian cancer Daughter    Colon cancer Daughter    Breast cancer Neg Hx      Atrial Fibrillation Management history:  Previous antiarrhythmic drugs: amiodarone  Previous cardioversions: 12/26/16, 05/16/17, 06/09/17, 11/30/18 Previous ablations: none CHADS2VASC score:  6 Anticoagulation history: Xarelto   Past Medical History:  Diagnosis Date   (HFpEF) heart failure with preserved ejection fraction ([place])    a. 07/2017 Echo: EF 50-55%, no rwma, mild AS/AI, mod MR, mod dil LA, mod TR, PASP 98mHg; b. 03/2021 Echo: EF 60-65%. No rwma. Nl RV fxn. Sev BAE. Mild MR. Mild AI/AS.   [place]immune [place]    followed by Dr SGustavo Lah  Fibrocystic breast disease    Glaucoma    Hypercholesterolemia    Hypertension    Hypothyroidism    Inflammatory arthritis    [place]ocardial infarction) [place]    Mitral regurgitation    a. 07/2017 Echo: Mod MR; b. 08/2017 Cath: 4+/Severe MR; c. 08/2017 TEE: Mild MR; d. 03/2021 Echo: Mild MR.   Neuropathy    Non-obstructive Coronary artery disease    a. 05/2014 NSTEMI/Cath: LM nl, LAD mild dzs, LCX 60ost hazy (? culprit), RCA mild dzs. EF 60% by echo-->Med Rx; b. 08/2017 Cath: LM 30ost, LAD min irregs, LCX small, 40ost/p, RCA large, min irregs, RPDA/RPL1 nl, EF 50-55%. 4+MR.   Osteoarthritis    Permanent atrial fibrillation (HCC)    a. CHA2DS2VASc = 6-->Xarelto.   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   Past Surgical History:  Procedure Laterality Date   ABDOMINAL HYSTERECTOMY  1963   partial, secondary to fibroids   APPENDECTOMY     Billroth     CARDIAC CATHETERIZATION  05/12/14   [facility] [place]  CARDIOVERSION N/A 12/26/2016   Procedure: CARDIOVERSION;  Surgeon: AWellington Hampshire MD;  Location: [facility] ORS;  Service: Cardiovascular;  Laterality: N/A;   CARDIOVERSION N/A 05/16/2017   Procedure: CARDIOVERSION;  Surgeon: [name], MD;  Location: [facility] ORS;  Service: Cardiovascular;  Laterality: N/A;   CARDIOVERSION N/A 06/09/2017   Procedure: CARDIOVERSION;  Surgeon: [name], MD;  Location: [facility] ORS;  Service: Cardiovascular;  Laterality: N/A;   CARDIOVERSION N/A 11/30/2018   Procedure: CARDIOVERSION;  Surgeon: [name], MD;  Location: [place] ORS;  Service:  Cardiovascular;  Laterality: N/A;   CHOLECYSTECTOMY  1998   COLONOSCOPY  2009   PACEMAKER IMPLANT N/A 09/28/2021   Procedure: PACEMAKER IMPLANT;  Surgeon: Vickie Epley, MD;  Location: [place] CV LAB;  Service: Cardiovascular;  Laterality: N/A;   RIGHT/LEFT HEART CATH AND CORONARY ANGIOGRAPHY Bilateral 08/21/2017   Procedure: RIGHT/LEFT HEART CATH AND CORONARY ANGIOGRAPHY;  Surgeon: [name], MD;  Location: [place] CV LAB;  Service: Cardiovascular;  Laterality: Bilateral;   TEE WITHOUT CARDIOVERSION N/A 08/25/2017   Procedure: TRANSESOPHAGEAL ECHOCARDIOGRAM (TEE);  Surgeon: Jolaine Artist, MD;  Location: [facility] ENDOSCOPY;  Service: Cardiovascular;  Laterality: N/A;   UPPER GI ENDOSCOPY  2004    Current Outpatient Medications  Medication Sig Dispense Refill   allopurinol (ZYLOPRIM) 100 MG tablet TAKE 1/2 TABLET(50 MG) BY MOUTH DAILY 45 tablet 1   diltiazem (CARDIZEM CD) 180 MG 24 hr capsule Take 1 capsule (180 mg total) by mouth daily. 90 capsule 2   dorzolamide (TRUSOPT) 2 % ophthalmic solution Place 1 drop into the left eye 2 (two) times daily.     fluticasone (FLONASE) 50 MCG/ACT nasal spray Place 2 sprays into both nostrils daily as needed for allergies.     gabapentin (NEURONTIN) 300 MG capsule One capsule tid and two capsules q hs. 150 capsule 2   hydroxychloroquine (PLAQUENIL) 200 MG tablet Take 200 mg by mouth every other day.     ipratropium (ATROVENT) 0.03 % nasal spray Place 2 sprays into both nostrils 3 (three) times daily as needed for rhinitis.     latanoprost (XALATAN) 0.005 % ophthalmic solution Place 1 drop into both eyes at bedtime.      levothyroxine (SYNTHROID) 125 MCG tablet Take 1 tablet (125 mcg total) by mouth daily. 90 tablet 3   metoprolol tartrate (LOPRESSOR) 100 MG tablet Take 1.5 tablets (150 mg total) by mouth 2 (two) times daily. 180 tablet 1   Multiple Vitamin (MULTI-VITAMIN) tablet Take 1 tablet by mouth at bedtime.      Probiotic Product  (ALIGN) 4 MG CAPS Take one capsule q day 30 capsule 0   Rivaroxaban (XARELTO) 15 MG TABS tablet TAKE 1 TABLET(15 MG) BY MOUTH DAILY WITH SUPPER 90 tablet 1   torsemide (DEMADEX) 20 MG tablet Take 1 tablet (20 mg total) by mouth every other day. 45 tablet 3   traMADol (ULTRAM) 50 MG tablet Take 1 tablet (50 mg total) by mouth 2 (two) times daily as needed (for pain.). 60 tablet 1   ursodiol (ACTIGALL) 300 MG capsule Take 300-600 mg by mouth See admin instructions. Take 2 capsules (600 mg) daily in the morning   & 1 capsule (300 mg) at night.     vitamin B-12 (CYANOCOBALAMIN) 500 MCG tablet Take 500 mcg by mouth every Monday.     No current facility-administered medications for this encounter.    Allergies  Allergen Reactions   Statins Other (See Comments)    Liver damage    Hydroxychloroquine Hives   Haldol [Haloperidol Lactate] Rash   Librax [Chlordiazepoxide-Clidinium] Rash   Plavix [  Clopidogrel Bisulfate] Rash   Ramipril Rash    Social History   Socioeconomic History   Marital status: Widowed    Spouse name: Not on file   Number of children: 2   Years of education: Not on file   Highest education level: Not on file  Occupational History   Occupation: retired  Tobacco Use   Smoking status: Never   Smokeless tobacco: Never   Tobacco comments:    Never smoke 01/23/22  Vaping Use   Vaping Use: Never used  Substance and Sexual Activity   Alcohol use: No    Alcohol/week: 0.0 standard drinks of alcohol   Drug use: No   Sexual activity: Never  Other Topics Concern   Not on file  Social History Narrative   Not on file   Social Determinants of Health   Financial Resource Strain: Low Risk  (05/28/2021)   Overall Financial Resource Strain (CARDIA)    Difficulty of Paying Living Expenses: Not hard at all  Food Insecurity: No Food Insecurity (05/28/2021)   Hunger Vital Sign    Worried About Running Out of Food in the Last Year: Never true    [place] in the Last  Year: Never true  Transportation Needs: No Transportation Needs (05/28/2021)   PRAPARE - Hydrologist (Medical): No    Lack of Transportation (Non-Medical): No  Physical Activity: Insufficiently Active (05/28/2021)   Exercise Vital Sign    Days of Exercise per Week: 3 days    Minutes of Exercise per Session: 40 min  Stress: No Stress Concern Present (05/28/2021)   [place]    Feeling of Stress : Not at all  Social Connections: Unknown (05/28/2021)   Social Connection and Isolation Panel [NHANES]    Frequency of Communication with Friends and Family: More than three times a week    Frequency of Social Gatherings with Friends and Family: More than three times a week    Attends Religious Services: Not on file    Active Member of Clubs or Organizations: Not on file    Attends Archivist Meetings: Not on file    Marital Status: Not on file  Intimate Partner Violence: Not At Risk (05/28/2021)   Humiliation, Afraid, Rape, and Kick questionnaire    Fear of Current or Ex-Partner: No    Emotionally Abused: No    Physically Abused: No    Sexually Abused: No     ROS- All systems are reviewed and negative except as per the HPI above.  Physical Exam: Vitals:   02/14/22 1326  BP: 128/80  Pulse: 85  Weight: 61.9 kg  Height: '5\' 2"'$  (1.575 m)     GEN- The patient is a well appearing elderly female, alert and oriented x 3 today.   HEENT-head normocephalic, atraumatic, sclera clear, conjunctiva pink, hearing intact, trachea midline. Lungs- Clear to ausculation bilaterally, normal work of breathing Heart- irregular rate and rhythm, no murmurs, rubs or gallops  GI- soft, NT, ND, + BS Extremities- no clubbing, cyanosis, or edema MS- no significant deformity or atrophy Skin- no rash or lesion Psych- euthymic mood, full affect Neuro- strength and sensation are intact   Wt Readings from Last  3 Encounters:  02/14/22 61.9 kg  01/23/22 63 kg  12/19/21 62.4 kg    EKG today demonstrates  Afib, occasional V pacing Vent. rate 85 BPM PR interval * ms QRS duration 78 ms QT/QTcB  374/445 ms  Echo 03/16/21 demonstrated   1. Left ventricular ejection fraction, by estimation, is 60 to 65%. The  left ventricle has normal function. The left ventricle has no regional  wall motion abnormalities. Left ventricular diastolic function could not  be evaluated.   2. Right ventricular systolic function is normal. The right ventricular  size is normal.   3. Left atrial size was severely dilated.   4. Right atrial size was severely dilated.   5. The mitral valve is degenerative. Mild mitral valve regurgitation. No  evidence of mitral stenosis. Moderate mitral annular calcification.   6. The non-coronary cusp appears fused. The aortic valve is tricuspid.  There is severe calcifcation of the aortic valve. Aortic valve  regurgitation is mild. Mild aortic valve stenosis.   7. The inferior vena cava is normal in size with greater than 50%  respiratory variability, suggesting right atrial pressure of 3 mmHg.   Epic records are reviewed at length today  CHA2DS2-VASc Score = 6  The patient's score is based upon: CHF History: 1 HTN History: 1 Diabetes History: 0 Stroke History: 0 Vascular Disease History: 1 Age Score: 2 Gender Score: 1       ASSESSMENT AND PLAN: 1. Permanent Atrial Fibrillation (ICD10:  I48.11) The patient's CHA2DS2-VASc score is 6, indicating a 9.7% annual risk of stroke.   Patient in permanent atrial fibrillation.  Rate control improved on higher dose of BB.  Continue diltiazem 180 mg daily. Recall she had increased lower extremity edema at higher doses.  Continue Lopressor 150 mg BID Continue Xarelto 15 mg daily  2. Secondary Hypercoagulable State (ICD10:  D68.69) The patient is at significant risk for stroke/thromboembolism based upon her CHA2DS2-VASc Score of 6.   Continue Rivaroxaban (Xarelto).   3. Tachybradycardia syndrome S/p PPM, followed by Dr Quentin Ore and the device clinic.   4. HTN Stable, no changes today.  5. CAD Followed by Dr Fletcher Anon  No anginal symptoms.   Follow up with Dr Haroldine Laws as scheduled and with Dr Fletcher Anon and Dr Quentin Ore per recalls.    [facility] [address] [place], [place] [address] [phone] 02/14/2022 1:37 PM

## 2022-02-15 ENCOUNTER — Encounter: Payer: Self-pay | Admitting: Family

## 2022-02-15 ENCOUNTER — Ambulatory Visit (INDEPENDENT_AMBULATORY_CARE_PROVIDER_SITE_OTHER): Payer: Medicare Other | Admitting: Family

## 2022-02-15 VITALS — BP 118/76 | HR 111 | Temp 98.0°F | Wt 136.4 lb

## 2022-02-15 DIAGNOSIS — L299 Pruritus, unspecified: Secondary | ICD-10-CM

## 2022-02-15 DIAGNOSIS — J329 Chronic sinusitis, unspecified: Secondary | ICD-10-CM | POA: Diagnosis not present

## 2022-02-15 DIAGNOSIS — R051 Acute cough: Secondary | ICD-10-CM

## 2022-02-15 DIAGNOSIS — I2510 Atherosclerotic heart disease of native coronary artery without angina pectoris: Secondary | ICD-10-CM

## 2022-02-15 LAB — POCT INFLUENZA A/B
Influenza A, POC: NEGATIVE
Influenza B, POC: NEGATIVE

## 2022-02-15 LAB — POC COVID19 BINAXNOW: SARS Coronavirus 2 Ag: NEGATIVE

## 2022-02-15 LAB — POCT RAPID STREP A (OFFICE): Rapid Strep A Screen: NEGATIVE

## 2022-02-15 MED ORDER — AMOXICILLIN-POT CLAVULANATE 875-125 MG PO TABS: 1.0000 | ORAL_TABLET | Freq: Two times a day (BID) | ORAL | 0 refills | Status: AC

## 2022-02-15 MED ORDER — MOMETASONE FUROATE 0.1 % EX CREA: 1.0000 | TOPICAL_CREAM | Freq: Every day | CUTANEOUS | 0 refills | Status: DC

## 2022-02-15 NOTE — Patient Instructions (Addendum)
Start Augmentin which is an antibiotic. Ensure to take probiotics while on antibiotics and also for 2 weeks after completion. This can either be by eating yogurt daily or taking a probiotic supplement over the counter such as Culturelle.It is important to re-colonize the gut with good bacteria and also to prevent any diarrheal infections associated with antibiotic use.   Please let me know if facial pain does not resolve.  I have also sent in ointment for you to use in your left ear for itching.

## 2022-02-15 NOTE — Progress Notes (Signed)
Subjective:    [name], female    DOB: 1928/04/07, 88 y.o.   MRN: [id number]  CC: Ruth Roth is a 88 y.o. female who presents today for an acute visit.    HPI: Complains of facial pain x 2 days. 'my whole face hurts'. She feels she has sinus infection and describes that this usually occurs twice per year.  Endorses left ear pressure, itching   No fever, HA, vision changes or vision loss, facial numbness.  SOB is baseline.   She has been taking cloricidin.   No h/o lung disease. Never smoker.   Treated with amoxicillin 12/19/21 and reports symptoms today are similar.   History of lower chronic diastolic heart failure, persistent atrial fibrillation, pulmonary hypertension, severe mitral regurgitation, GERD, hypothyroidism  She has pacemaker Follow-up with cardiology 01/23/2022.  She remains on diltiazem, Lopressor and Xarelto. HISTORY:  Past Medical History:  Diagnosis Date   (HFpEF) heart failure with preserved ejection fraction ([place])    a. 07/2017 Echo: EF 50-55%, no rwma, mild AS/AI, mod MR, mod dil LA, mod TR, PASP 60mHg; b. 03/2021 Echo: EF 60-65%. No rwma. Nl RV fxn. Sev BAE. Mild MR. Mild AI/AS.   [place]    followed by Dr SGustavo Lah  Fibrocystic breast disease    Glaucoma    Hypercholesterolemia    Hypertension    Hypothyroidism    Inflammatory arthritis    [place]ocardial infarction) ([place]    Mitral regurgitation    a. 07/2017 Echo: Mod MR; b. 08/2017 Cath: 4+/Severe MR; c. 08/2017 TEE: Mild MR; d. 03/2021 Echo: Mild MR.   Neuropathy    Non-obstructive Coronary artery disease    a. 05/2014 NSTEMI/Cath: LM nl, LAD mild dzs, LCX 60ost hazy (? culprit), RCA mild dzs. EF 60% by echo-->Med Rx; b. 08/2017 Cath: LM 30ost, LAD min irregs, LCX small, 40ost/p, RCA large, min irregs, RPDA/RPL1 nl, EF 50-55%. 4+MR.   Osteoarthritis    Permanent atrial fibrillation (HCC)    a. CHA2DS2VASc = 6-->Xarelto.   PUD (peptic ulcer disease)    requiring  Billroth II surgery with resulting dumping syndrome   Past Surgical History:  Procedure Laterality Date   ABDOMINAL HYSTERECTOMY  1963   partial, secondary to fibroids   APPENDECTOMY     Billroth     CARDIAC CATHETERIZATION  05/12/14   [facility]   CARDIOVERSION N/A 12/26/2016   Procedure: CARDIOVERSION;  Surgeon: AWellington Hampshire MD;  Location: [facility] ORS;  Service: Cardiovascular;  Laterality: N/A;   CARDIOVERSION N/A 05/16/2017   Procedure: CARDIOVERSION;  Surgeon: AWellington Hampshire MD;  Location: [facility] ORS;  Service: Cardiovascular;  Laterality: N/A;   CARDIOVERSION N/A 06/09/2017   Procedure: CARDIOVERSION;  Surgeon: AWellington Hampshire MD;  Location: [facility] ORS;  Service: Cardiovascular;  Laterality: N/A;   CARDIOVERSION N/A 11/30/2018   Procedure: CARDIOVERSION;  Surgeon: AWellington Hampshire MD;  Location: [facility] ORS;  Service: Cardiovascular;  Laterality: N/A;   [place]   PACEMAKER IMPLANT N/A 09/28/2021   Procedure: PACEMAKER IMPLANT;  Surgeon: LVickie Epley MD;  [place] [facility];  Service: Cardiovascular;  Laterality: N/A;   RIGHT/LEFT HEART CATH AND CORONARY ANGIOGRAPHY Bilateral 08/21/2017   Procedure: RIGHT/LEFT HEART CATH AND CORONARY ANGIOGRAPHY;  Surgeon: AWellington Hampshire MD;  [place];  Service: Cardiovascular;  Laterality: Bilateral;   TEE WITHOUT CARDIOVERSION N/A 08/25/2017   Procedure: TRANSESOPHAGEAL ECHOCARDIOGRAM (TEE);  Surgeon: Jolaine Artist, MD;  Location: [facility] ENDOSCOPY;  Service: Cardiovascular;  Laterality: N/A;   UPPER GI ENDOSCOPY  2004   Family History  Problem Relation Age of Onset   Stroke Mother    Esophageal cancer Brother        also had lung cancer   Ovarian cancer Daughter    Colon cancer Daughter    Breast cancer Neg Hx     Allergies: Statins, Hydroxychloroquine, Haldol [haloperidol lactate], Librax [chlordiazepoxide-clidinium], Plavix [clopidogrel bisulfate], and Ramipril Current  Outpatient Medications on File Prior to Visit  Medication Sig Dispense Refill   allopurinol (ZYLOPRIM) 100 MG tablet TAKE 1/2 TABLET(50 MG) BY MOUTH DAILY 45 tablet 1   diltiazem (CARDIZEM CD) 180 MG 24 hr capsule Take 1 capsule (180 mg total) by mouth daily. 90 capsule 2   dorzolamide (TRUSOPT) 2 % ophthalmic solution Place 1 drop into the left eye 2 (two) times daily.     fluticasone (FLONASE) 50 MCG/ACT nasal spray Place 2 sprays into both nostrils daily as needed for allergies.     gabapentin (NEURONTIN) 300 MG capsule One capsule tid and two capsules q hs. 150 capsule 2   hydroxychloroquine (PLAQUENIL) 200 MG tablet Take 200 mg by mouth every other day.     ipratropium (ATROVENT) 0.03 % nasal spray Place 2 sprays into both nostrils 3 (three) times daily as needed for rhinitis.     latanoprost (XALATAN) 0.005 % ophthalmic solution Place 1 drop into both eyes at bedtime.      levothyroxine (SYNTHROID) 125 MCG tablet Take 1 tablet (125 mcg total) by mouth daily. 90 tablet 3   metoprolol tartrate (LOPRESSOR) 100 MG tablet Take 1.5 tablets (150 mg total) by mouth 2 (two) times daily. 180 tablet 1   Multiple Vitamin (MULTI-VITAMIN) tablet Take 1 tablet by mouth at bedtime.      Probiotic Product (ALIGN) 4 MG CAPS Take one capsule q day 30 capsule 0   Rivaroxaban (XARELTO) 15 MG TABS tablet TAKE 1 TABLET(15 MG) BY MOUTH DAILY WITH SUPPER 90 tablet 1   traMADol (ULTRAM) 50 MG tablet Take 1 tablet (50 mg total) by mouth 2 (two) times daily as needed (for pain.). 60 tablet 1   ursodiol (ACTIGALL) 300 MG capsule Take 300-600 mg by mouth See admin instructions. Take 2 capsules (600 mg) daily in the morning   & 1 capsule (300 mg) at night.     vitamin B-12 (CYANOCOBALAMIN) 500 MCG tablet Take 500 mcg by mouth every Monday.     torsemide (DEMADEX) 20 MG tablet Take 1 tablet (20 mg total) by mouth every other day. 45 tablet 3   No current facility-administered medications on file prior to visit.     Social History   Tobacco Use   Smoking status: Never   Smokeless tobacco: Never   Tobacco comments:    Never smoke 01/23/22  Vaping Use   Vaping Use: Never used  Substance Use Topics   Alcohol use: No    Alcohol/week: 0.0 standard drinks of alcohol   Drug use: No    Review of Systems  Constitutional:  Negative for chills and fever.  HENT:  Positive for ear pain and sinus pain. Negative for congestion and facial swelling.   Eyes:  Negative for visual disturbance.  Respiratory:  Negative for cough.   Cardiovascular:  Negative for chest pain and palpitations.  Gastrointestinal:  Negative for nausea and vomiting.  Neurological:  Negative for headaches.  Objective:    BP 118/76   Pulse (!) 111   Temp 98 F (36.7 C) (Oral)   Wt 136 lb 6.4 oz (61.9 kg)   SpO2 96%   BMI 24.95 kg/m    Physical Exam Vitals reviewed.  Constitutional:      Appearance: She is well-developed.  HENT:     Head: Normocephalic and atraumatic.     Right Ear: Hearing, tympanic membrane, ear canal and external ear normal. No decreased hearing noted. No drainage, swelling or tenderness. No middle ear effusion. No foreign body. Tympanic membrane is not erythematous or bulging.     Left Ear: Hearing, tympanic membrane, ear canal and external ear normal. No decreased hearing noted. No drainage, swelling or tenderness.  No middle ear effusion. No foreign body. Tympanic membrane is not erythematous or bulging.     Nose: Nose normal. No rhinorrhea.     Right Sinus: No maxillary sinus tenderness or frontal sinus tenderness.     Left Sinus: No maxillary sinus tenderness or frontal sinus tenderness.     Mouth/Throat:     Pharynx: Uvula midline. No oropharyngeal exudate or posterior oropharyngeal erythema.     Tonsils: No tonsillar abscesses.  Eyes:     Conjunctiva/sclera: Conjunctivae normal.  Cardiovascular:     Rate and Rhythm: Regular rhythm.     Pulses: Normal pulses.     Heart sounds: Normal  heart sounds.  Pulmonary:     Effort: Pulmonary effort is normal.     Breath sounds: Normal breath sounds. No wheezing, rhonchi or rales.  Lymphadenopathy:     Head:     Right side of head: No submental, submandibular, tonsillar, preauricular, posterior auricular or occipital adenopathy.     Left side of head: No submental, submandibular, tonsillar, preauricular, posterior auricular or occipital adenopathy.     Cervical: No cervical adenopathy.  Skin:    General: Skin is warm and dry.  Neurological:     Mental Status: She is alert.  Psychiatric:        Speech: Speech normal.        Behavior: Behavior normal.        Thought Content: Thought content normal.        Assessment & Plan:   Problem List Items Addressed This Visit       Respiratory   Sinusitis - Primary    Afebrile.  Nontoxic in appearance .  Negative flu, strep and COVID . although duration has been 2 days, based on severity of facial pain and patient discomfort , we agreed to treat for suspected bacterial sinusitis.  CrtCl 32 ml/min , therefore augmentin doesn't need to be renally dosed. Start augmentin and probiotics.        Relevant Medications   amoxicillin-clavulanate (AUGMENTIN) 875-125 MG tablet     Other   Cough   Relevant Orders   POC COVID-19 (Completed)   POCT rapid strep A (Completed)   POCT Influenza A/B (Completed)   Other Visit Diagnoses     Ear itching       Relevant Medications   mometasone (ELOCON) 0.1 % cream         I am having Ruth Roth start on amoxicillin-clavulanate and mometasone. I am also having her maintain her fluticasone, dorzolamide, ursodiol, latanoprost, Multi-Vitamin, gabapentin, ipratropium, hydroxychloroquine, torsemide, Align, vitamin B-12, allopurinol, traMADol, diltiazem, levothyroxine, Rivaroxaban, and metoprolol tartrate.   Meds ordered this encounter  Medications   amoxicillin-clavulanate (AUGMENTIN) 875-125 MG tablet    Sig: Take  1 tablet by mouth 2  (two) times daily for 7 days.    Dispense:  14 tablet    Refill:  0    Order Specific Question:   Supervising Provider    Answer:   Deborra Medina L [[id number]]   mometasone (ELOCON) 0.1 % cream    Sig: Apply 1 Application topically daily. Appear pea sized ( or less) amount to external ear, and slightly in canal.    Dispense:  15 g    Refill:  0    Order Specific Question:   Supervising Provider    Answer:   Crecencio Mc [[id number]]    Return precautions given.   Risks, benefits, and alternatives of the medications and treatment plan prescribed today were discussed, and patient expressed understanding.   Education regarding symptom management and diagnosis given to patient on AVS.  Continue to follow with Einar Pheasant, MD for routine health maintenance.   [name] and I agreed with plan.   Mable Paris, FNP

## 2022-02-15 NOTE — Assessment & Plan Note (Addendum)
Afebrile.  Nontoxic in appearance .  Negative flu, strep and COVID . although duration has been 2 days, based on severity of facial pain and patient discomfort , we agreed to treat for suspected bacterial sinusitis.  CrtCl 32 ml/min , therefore augmentin doesn't need to be renally dosed. Start augmentin and probiotics.

## 2022-02-27 ENCOUNTER — Other Ambulatory Visit (INDEPENDENT_AMBULATORY_CARE_PROVIDER_SITE_OTHER): Payer: Medicare Other

## 2022-02-27 DIAGNOSIS — N1832 Chronic kidney disease, stage 3b: Secondary | ICD-10-CM

## 2022-02-27 DIAGNOSIS — E039 Hypothyroidism, unspecified: Secondary | ICD-10-CM | POA: Diagnosis not present

## 2022-02-27 DIAGNOSIS — E7800 Pure hypercholesterolemia, unspecified: Secondary | ICD-10-CM

## 2022-02-27 DIAGNOSIS — N2581 Secondary hyperparathyroidism of renal origin: Secondary | ICD-10-CM | POA: Diagnosis not present

## 2022-02-27 DIAGNOSIS — K754 Autoimmune hepatitis: Secondary | ICD-10-CM

## 2022-02-27 DIAGNOSIS — E875 Hyperkalemia: Secondary | ICD-10-CM

## 2022-02-27 DIAGNOSIS — I5032 Chronic diastolic (congestive) heart failure: Secondary | ICD-10-CM

## 2022-02-27 DIAGNOSIS — I10 Essential (primary) hypertension: Secondary | ICD-10-CM | POA: Diagnosis not present

## 2022-02-27 DIAGNOSIS — R600 Localized edema: Secondary | ICD-10-CM | POA: Diagnosis not present

## 2022-02-27 LAB — BASIC METABOLIC PANEL
BUN: 34 mg/dL — ABNORMAL HIGH (ref 6–23)
CO2: 23 mEq/L (ref 19–32)
Calcium: 8.5 mg/dL (ref 8.4–10.5)
Chloride: 108 mEq/L (ref 96–112)
Creatinine, Ser: 1.34 mg/dL — ABNORMAL HIGH (ref 0.40–1.20)
GFR: 34.61 mL/min — ABNORMAL LOW (ref >60.00)
Glucose, Bld: 83 mg/dL (ref 70–99)
Potassium: 4.9 mEq/L (ref 3.5–5.1)
Sodium: 139 mEq/L (ref 135–145)

## 2022-02-27 LAB — LIPID PANEL
Cholesterol: 136 mg/dL (ref 0–200)
HDL: 53.4 mg/dL (ref >39.00)
LDL Cholesterol: 59 mg/dL (ref 0–99)
NonHDL: 82.32
Total CHOL/HDL Ratio: 3
Triglycerides: 115 mg/dL (ref 0.0–149.0)
VLDL: 23 mg/dL (ref 0.0–40.0)

## 2022-02-27 LAB — HEPATIC FUNCTION PANEL
ALT: 17 U/L (ref 0–35)
AST: 16 U/L (ref 0–37)
Albumin: 4 g/dL (ref 3.5–5.2)
Alkaline Phosphatase: 103 U/L (ref 39–117)
Bilirubin, Direct: 0.1 mg/dL (ref 0.0–0.3)
Total Bilirubin: 0.4 mg/dL (ref 0.2–1.2)
Total Protein: 6.2 g/dL (ref 6.0–8.3)

## 2022-02-27 LAB — TSH: TSH: 1.9 u[IU]/mL (ref 0.35–5.50)

## 2022-03-07 ENCOUNTER — Other Ambulatory Visit: Payer: Self-pay | Admitting: Cardiovascular Disease

## 2022-03-18 ENCOUNTER — Other Ambulatory Visit: Payer: Self-pay | Admitting: Internal Medicine

## 2022-03-25 ENCOUNTER — Encounter: Payer: Self-pay | Admitting: Oncology

## 2022-04-01 ENCOUNTER — Encounter: Payer: Self-pay | Admitting: Oncology

## 2022-04-01 ENCOUNTER — Ambulatory Visit: Payer: Medicare Other | Attending: Cardiology

## 2022-04-01 ENCOUNTER — Ambulatory Visit: Payer: Medicare Other | Admitting: Internal Medicine

## 2022-04-01 DIAGNOSIS — I495 Sick sinus syndrome: Secondary | ICD-10-CM

## 2022-04-02 ENCOUNTER — Ambulatory Visit (INDEPENDENT_AMBULATORY_CARE_PROVIDER_SITE_OTHER): Payer: Medicare Other | Admitting: Internal Medicine

## 2022-04-02 ENCOUNTER — Encounter: Payer: Self-pay | Admitting: Internal Medicine

## 2022-04-02 VITALS — BP 136/74 | HR 62 | Temp 97.8°F | Resp 16 | Ht 62.0 in | Wt 135.4 lb

## 2022-04-02 DIAGNOSIS — K7460 Unspecified cirrhosis of liver: Secondary | ICD-10-CM

## 2022-04-02 DIAGNOSIS — M810 Age-related osteoporosis without current pathological fracture: Secondary | ICD-10-CM

## 2022-04-02 DIAGNOSIS — K754 Autoimmune hepatitis: Secondary | ICD-10-CM | POA: Diagnosis not present

## 2022-04-02 DIAGNOSIS — K219 Gastro-esophageal reflux disease without esophagitis: Secondary | ICD-10-CM

## 2022-04-02 DIAGNOSIS — D509 Iron deficiency anemia, unspecified: Secondary | ICD-10-CM

## 2022-04-02 DIAGNOSIS — I272 Pulmonary hypertension, unspecified: Secondary | ICD-10-CM

## 2022-04-02 DIAGNOSIS — M25562 Pain in left knee: Secondary | ICD-10-CM

## 2022-04-02 DIAGNOSIS — K743 Primary biliary cirrhosis: Secondary | ICD-10-CM

## 2022-04-02 DIAGNOSIS — D329 Benign neoplasm of meninges, unspecified: Secondary | ICD-10-CM

## 2022-04-02 DIAGNOSIS — E039 Hypothyroidism, unspecified: Secondary | ICD-10-CM

## 2022-04-02 DIAGNOSIS — Z0000 Encounter for general adult medical examination without abnormal findings: Secondary | ICD-10-CM

## 2022-04-02 DIAGNOSIS — I4819 Other persistent atrial fibrillation: Secondary | ICD-10-CM | POA: Diagnosis not present

## 2022-04-02 DIAGNOSIS — E7800 Pure hypercholesterolemia, unspecified: Secondary | ICD-10-CM | POA: Diagnosis not present

## 2022-04-02 DIAGNOSIS — I25118 Atherosclerotic heart disease of native coronary artery with other forms of angina pectoris: Secondary | ICD-10-CM

## 2022-04-02 DIAGNOSIS — N1832 Chronic kidney disease, stage 3b: Secondary | ICD-10-CM

## 2022-04-02 DIAGNOSIS — I10 Essential (primary) hypertension: Secondary | ICD-10-CM | POA: Diagnosis not present

## 2022-04-02 DIAGNOSIS — G6289 Other specified polyneuropathies: Secondary | ICD-10-CM

## 2022-04-02 DIAGNOSIS — I5032 Chronic diastolic (congestive) heart failure: Secondary | ICD-10-CM

## 2022-04-02 DIAGNOSIS — D6869 Other thrombophilia: Secondary | ICD-10-CM

## 2022-04-02 DIAGNOSIS — R0602 Shortness of breath: Secondary | ICD-10-CM

## 2022-04-02 MED ORDER — TRAMADOL HCL 50 MG PO TABS: 50.0000 mg | ORAL_TABLET | Freq: Two times a day (BID) | ORAL | 1 refills | Status: DC | PRN

## 2022-04-02 MED ORDER — NEOMYCIN-POLYMYXIN-HC 1 % OT SOLN: 3.0000 [drp] | Freq: Four times a day (QID) | OTIC | 0 refills | Status: DC

## 2022-04-02 NOTE — Assessment & Plan Note (Signed)
Physical today 04/02/22.  Mammogram 01/17/22 - Birads I.  Continues f/u with GI.

## 2022-04-02 NOTE — Progress Notes (Unsigned)
Subjective:    [name], female    DOB: Jul 31, 1928, 89 y.o.   MRN: [id number]  Patient here for No chief complaint on file.   HPI With past history of afib, CAD, chronic diastolic heart failure and hypertension.  S/p pacemaker placement in July.  Comes in to f/u on these issues as well as for a complete physical exam.   Seeing rheumatology for undifferentiated inflammatory arthritis. Taking allopurinol and plaquenil.     Past Medical History:  Diagnosis Date   (HFpEF) heart failure with preserved ejection fraction ([place])    a. 07/2017 Echo: EF 50-55%, no rwma, mild AS/AI, mod MR, mod dil LA, mod TR, PASP 27mHg; b. 03/2021 Echo: EF 60-65%. No rwma. Nl RV fxn. Sev BAE. Mild MR. Mild AI/AS.   [place]    follow[place] by Dr SGustavo Lah  Fibrocystic breast disease    Glaucoma    Hypercholesterolemia    Hypertension    Hypothyroidism    Inflammatory arthritis    MI ([place]ocardial infarction) ([place]    Mitral regurgitation    a. 07/2017 Echo: Mod MR; b. 08/2017 Cath: 4+/Severe MR; c. 08/2017 TEE: Mild MR; d. 03/2021 Echo: Mild MR.   Neuropathy    Non-obstructive Coronary artery disease    a. 05/2014 NSTEMI/Cath: LM nl, LAD mild dzs, LCX 60ost hazy (? culprit), RCA mild dzs. EF 60% by echo-->Med Rx; b. 08/2017 Cath: LM 30ost, LAD min irregs, LCX small, 40ost/p, RCA large, min irregs, RPDA/RPL1 nl, EF 50-55%. 4+MR.   Osteoarthritis    Permanent atrial fibrillation (HCC)    a. CHA2DS2VASc = 6-->Xarelto.   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   Past Surgical History:  Procedure Laterality Date   ABDOMINAL HYSTERECTOMY  1963   partial, secondary to fibroids   APPENDECTOMY     Billroth     CARDIAC CATHETERIZATION  05/12/14   [facility]   CARDIOVERSION N/A 12/26/2016   Procedure: CARDIOVERSION;  Surgeon: AWellington Hampshire MD;  Location: [facility] ORS;  Service: Cardiovascular;  Laterality: N/A;   CARDIOVERSION N/A 05/16/2017    Procedure: CARDIOVERSION;  Surgeon: AWellington Hampshire MD;  Location: [facility] ORS;  Service: Cardiovascular;  Laterality: N/A;   CARDIOVERSION N/A 06/09/2017   Procedure: CARDIOVERSION;  Surgeon: AWellington Hampshire MD;  Location: [facility] ORS;  Service: Cardiovascular;  Laterality: N/A;   CARDIOVERSION N/A 11/30/2018   Procedure: CARDIOVERSION;  Surgeon: AWellington Hampshire MD;  Location: [facility] ORS;  Service: Cardiovascular;  Laterality: N/A;   [place] 2009   PACEMAKER IMPLANT N/A 09/28/2021   Procedure: PACEMAKER IMPLANT;  Surgeon: LVickie Epley MD;  [facility];  Service: Cardiovascular;  Laterality: N/A;   RIGHT/LEFT HEART CATH AND CORONARY ANGIOGRAPHY Bilateral 08/21/2017   Procedure: RIGHT/LEFT HEART CATH AND CORONARY ANGIOGRAPHY;  Surgeon: AWellington Hampshire MD;  [facility];  Service: Cardiovascular;  Laterality: Bilateral;   TEE WITHOUT CARDIOVERSION N/A 08/25/2017   Procedure: TRANSESOPHAGEAL ECHOCARDIOGRAM (TEE);  Surgeon: BJolaine Artist MD;  [facility];  Service: Cardiovascular;  Laterality: N/A;   UPPER GI ENDOSCOPY  2004   Family History  Problem Relation Age of Onset   Stroke Mother    Esophageal cancer Brother        also had lung cancer   Ovarian cancer Daughter    Colon cancer Daughter    Breast cancer Neg Hx  Social History   Socioeconomic History   Marital status: Widowed    Spouse name: Not on file   Number of children: 2   Years of education: Not on file   Highest education level: Not on file  Occupational History   Occupation: retired  Tobacco Use   Smoking status: Never   Smokeless tobacco: Never   Tobacco comments:    Never smoke 01/23/22  Vaping Use   Vaping Use: Never used  Substance and Sexual Activity   Alcohol use: No    Alcohol/week: 0.0 standard drinks of alcohol   Drug use: No   Sexual activity: Never  Other Topics Concern   Not on file  Social History  Narrative   Not on file   Social Determinants of Health   Financial Resource Strain: Low Risk  (05/28/2021)   Overall Financial Resource Strain (CARDIA)    Difficulty of Paying Living Expenses: Not hard at all  Food Insecurity: No Food Insecurity (05/28/2021)   Hunger Vital Sign    Worried About Running Out of Food in the Last Year: Never true    [place] in the Last Year: Never true  Transportation Needs: No Transportation Needs (05/28/2021)   PRAPARE - Hydrologist (Medical): No    Lack of Transportation (Non-Medical): No  Physical Activity: Insufficiently Active (05/28/2021)   Exercise Vital Sign    Days of Exercise per Week: 3 days    Minutes of Exercise per Session: 40 min  Stress: No Stress Concern Present (05/28/2021)   [place]    Feeling of Stress : Not at all  Social Connections: Unknown (05/28/2021)   Social Connection and Isolation Panel [NHANES]    Frequency of Communication with Friends and Family: More than three times a week    Frequency of Social Gatherings with Friends and Family: More than three times a week    Attends Religious Services: Not on file    Active Member of Clubs or Organizations: Not on file    Attends Archivist Meetings: Not on file    Marital Status: Not on file     Review of Systems     Objective:     There were no vitals taken for this visit. Wt Readings from Last 3 Encounters:  02/15/22 136 lb 6.4 oz (61.9 kg)  02/14/22 136 lb 6.4 oz (61.9 kg)  01/23/22 139 lb (63 kg)    Physical Exam   Outpatient Encounter Medications as of 04/02/2022  Medication Sig   allopurinol (ZYLOPRIM) 100 MG tablet TAKE 1/2 TABLET(50 MG) BY MOUTH DAILY   diltiazem (CARDIZEM CD) 180 MG 24 hr capsule Take 1 capsule (180 mg total) by mouth daily.   dorzolamide (TRUSOPT) 2 % ophthalmic solution Place 1 drop into the left eye 2 (two) times daily.    fluticasone (FLONASE) 50 MCG/ACT nasal spray Place 2 sprays into both nostrils daily as needed for allergies.   gabapentin (NEURONTIN) 300 MG capsule One capsule tid and two capsules q hs.   hydroxychloroquine (PLAQUENIL) 200 MG tablet Take 200 mg by mouth every other day.   ipratropium (ATROVENT) 0.03 % nasal spray Place 2 sprays into both nostrils 3 (three) times daily as needed for rhinitis.   latanoprost (XALATAN) 0.005 % ophthalmic solution Place 1 drop into both eyes at bedtime.    levothyroxine (SYNTHROID) 125 MCG tablet Take 1 tablet (125 mcg total) by mouth daily.  metoprolol tartrate (LOPRESSOR) 100 MG tablet TAKE 1 AND 1/2 TABLETS(150 MG) BY MOUTH TWICE DAILY   mometasone (ELOCON) 0.1 % cream Apply 1 Application topically daily. Appear pea sized ( or less) amount to external ear, and slightly in canal.   Multiple Vitamin (MULTI-VITAMIN) tablet Take 1 tablet by mouth at bedtime.    Probiotic Product (ALIGN) 4 MG CAPS Take one capsule q day   Rivaroxaban (XARELTO) 15 MG TABS tablet TAKE 1 TABLET(15 MG) BY MOUTH DAILY WITH SUPPER   torsemide (DEMADEX) 20 MG tablet Take 1 tablet (20 mg total) by mouth every other day.   traMADol (ULTRAM) 50 MG tablet Take 1 tablet (50 mg total) by mouth 2 (two) times daily as needed (for pain.).   ursodiol (ACTIGALL) 300 MG capsule Take 300-600 mg by mouth See admin instructions. Take 2 capsules (600 mg) daily in the morning   & 1 capsule (300 mg) at night.   vitamin B-12 (CYANOCOBALAMIN) 500 MCG tablet Take 500 mcg by mouth every Monday.   No facility-administered encounter medications on file as of 04/02/2022.     Lab Results  Component Value Date   WBC 5.6 10/11/2021   HGB 12.6 10/11/2021   HCT 39.3 10/11/2021   PLT 194.0 10/11/2021   GLUCOSE 83 02/27/2022   CHOL 136 02/27/2022   TRIG 115.0 02/27/2022   HDL 53.40 02/27/2022   LDLDIRECT 143.5 12/17/2012   LDLCALC 59 02/27/2022   ALT 17 02/27/2022   AST 16 02/27/2022   NA 139 02/27/2022   K  4.9 02/27/2022   CL 108 02/27/2022   CREATININE 1.34 (H) 02/27/2022   BUN 34 (H) 02/27/2022   CO2 23 02/27/2022   TSH 1.90 02/27/2022   INR 1.63 12/29/2017   HGBA1C 5.6 01/27/2018    No results found.     Assessment & Plan:  There are no diagnoses linked to this encounter.   Einar Pheasant, MD

## 2022-04-02 NOTE — Patient Instructions (Signed)
Increase diltiazem to '240mg'$  per day

## 2022-04-03 DIAGNOSIS — K7460 Unspecified cirrhosis of liver: Secondary | ICD-10-CM | POA: Insufficient documentation

## 2022-04-03 LAB — CUP PACEART REMOTE DEVICE CHECK
Battery Remaining Longevity: 91 mo
Battery Remaining Percentage: 95.5 %
Battery Voltage: 3.01 V
Brady Statistic AP VP Percent: 0 %
Brady Statistic AP VS Percent: 0 %
Brady Statistic AS VP Percent: 81 %
Brady Statistic AS VS Percent: 19 %
Brady Statistic RA Percent Paced: 0 %
Brady Statistic RV Percent Paced: 35 %
Date Time Interrogation Session: 20240122020013
Implantable Lead Connection Status: 753985
Implantable Lead Connection Status: 753985
Implantable Lead Implant Date: 20230721
Implantable Lead Implant Date: 20230721
Implantable Lead Location: 753859
Implantable Lead Location: 753860
Implantable Pulse Generator Implant Date: 20230721
Lead Channel Impedance Value: 460 Ohm
Lead Channel Impedance Value: 460 Ohm
Lead Channel Pacing Threshold Amplitude: 0.75 V
Lead Channel Pacing Threshold Pulse Width: 0.5 ms
Lead Channel Sensing Intrinsic Amplitude: 1.8 mV
Lead Channel Sensing Intrinsic Amplitude: 11.8 mV
Lead Channel Setting Pacing Amplitude: 2.5 V
Lead Channel Setting Pacing Amplitude: 3.5 V
Lead Channel Setting Pacing Pulse Width: 0.5 ms
Lead Channel Setting Sensing Sensitivity: 2 mV
Pulse Gen Model: 2272
Pulse Gen Serial Number: 8087042

## 2022-04-03 LAB — BASIC METABOLIC PANEL
BUN: 39 mg/dL — ABNORMAL HIGH (ref 6–23)
CO2: 23 mEq/L (ref 19–32)
Calcium: 9.6 mg/dL (ref 8.4–10.5)
Chloride: 105 mEq/L (ref 96–112)
Creatinine, Ser: 1.27 mg/dL — ABNORMAL HIGH (ref 0.40–1.20)
GFR: 36.88 mL/min — ABNORMAL LOW (ref >60.00)
Glucose, Bld: 78 mg/dL (ref 70–99)
Potassium: 5.6 mEq/L — ABNORMAL HIGH (ref 3.5–5.1)
Sodium: 138 mEq/L (ref 135–145)

## 2022-04-03 LAB — HEPATIC FUNCTION PANEL
ALT: 15 U/L (ref 0–35)
AST: 12 U/L (ref 0–37)
Albumin: 4.1 g/dL (ref 3.5–5.2)
Alkaline Phosphatase: 104 U/L (ref 39–117)
Bilirubin, Direct: 0.1 mg/dL (ref 0.0–0.3)
Total Bilirubin: 0.4 mg/dL (ref 0.2–1.2)
Total Protein: 6.3 g/dL (ref 6.0–8.3)

## 2022-04-03 LAB — TSH: TSH: 3.41 u[IU]/mL (ref 0.35–5.50)

## 2022-04-03 NOTE — Assessment & Plan Note (Signed)
Avoid antiinflammatories.  Follow metabolic panel.  

## 2022-04-03 NOTE — Assessment & Plan Note (Signed)
On gabapentin.  

## 2022-04-03 NOTE — Assessment & Plan Note (Signed)
Followed by GI

## 2022-04-03 NOTE — Assessment & Plan Note (Signed)
On thyroid replacement.  Follow tsh.

## 2022-04-03 NOTE — Assessment & Plan Note (Signed)
Upper symptoms controlled.  On protonix.  

## 2022-04-03 NOTE — Assessment & Plan Note (Signed)
Continue xarelto

## 2022-04-03 NOTE — Assessment & Plan Note (Signed)
Has been seeing Dr Gabriel Carina.  Prolia.

## 2022-04-03 NOTE — Assessment & Plan Note (Signed)
Unable to take statin medication.  Follow lipid panel.

## 2022-04-03 NOTE — Assessment & Plan Note (Signed)
Followed by GI. Stable. Follow liver function tests.

## 2022-04-03 NOTE — Assessment & Plan Note (Signed)
Followed by cardiology. 

## 2022-04-03 NOTE — Assessment & Plan Note (Signed)
S/p gel injection.  Persistent pain as outlined.  Wants to hold on further intervention at this time. Plans to f/u with rheumatology for knee pain and left hip/leg pain.

## 2022-04-03 NOTE — Assessment & Plan Note (Signed)
Evaluated by Dr Jorene Minors (oncology [facility]).  States was told no further w/up or f/u neded.  Of note, no mention on MRI 2018.

## 2022-04-03 NOTE — Assessment & Plan Note (Signed)
Follow liver function tests.  Followed by GI.

## 2022-04-03 NOTE — Assessment & Plan Note (Signed)
Has iron deficiency.  Follow cbc and iron studies.

## 2022-04-03 NOTE — Assessment & Plan Note (Addendum)
Lungs clear. No cough or congestion. With increased sob with exertion.  Increased heart rate as outlined.  Being followed in afib clinic.  Evaluated 01/23/22 - pt in afib - heart rate >110 45% of the time on device interrogation at that time.  Lopressor increased to '150mg'$  bid.  She is also taking diltiazem '180mg'$  q day.  Previously decreased from '240mg'$  due to increased lower extremity swelling.  Continues on xarelto. Heart rate increased today.  EKG - ventricular rate 117 - afib/flutter.  Discussed treatment options. Already on an increased dose of metoprolol.  Had swelling with increasing diltiazem.  Would avoid digoxin given her CKD.  After discussion, she was agreeable to try increasing diltiazem to '240mg'$  q day.  Will monitor for lower extremity swelling.  She has f/u with Dr Haroldine Laws this week.  Will discuss further treatment with him.

## 2022-04-03 NOTE — Assessment & Plan Note (Signed)
On metoprolol and diltiazem.   Adjust diltiazem dose as outlined.  Check metabolic panel today. Follow pressures.

## 2022-04-03 NOTE — Assessment & Plan Note (Signed)
Continue to weigh daily.  No evidence of volume overload today. Follow.

## 2022-04-03 NOTE — Assessment & Plan Note (Signed)
Continue risk factor modification.  Continue metoprolol and xarelto.

## 2022-04-03 NOTE — Assessment & Plan Note (Signed)
With increased sob with exertion.  Increased heart rate as outlined.  Being followed in afib clinic.  Evaluated 01/23/22 - pt in afib - heart rate >110 45% of the time on device interrogation at that time.  Lopressor increased to '150mg'$  bid.  She is also taking diltiazem '180mg'$  q day.  Previously decreased from '240mg'$  due to increased lower extremity swelling.  Continues on xarelto. Heart rate increased today.  EKG - ventricular rate 117 - afib/flutter.  Discussed treatment options. Already on an increased dose of metoprolol.  Had swelling with increasing diltiazem.  Would avoid digoxin given her CKD.  After discussion, she was agreeable to try increasing diltiazem to '240mg'$  q day.  Will monitor for lower extremity swelling.  She has f/u with Dr Haroldine Laws this week.  Will discuss further treatment with him.

## 2022-04-04 ENCOUNTER — Other Ambulatory Visit (HOSPITAL_COMMUNITY): Payer: Self-pay | Admitting: Internal Medicine

## 2022-04-04 ENCOUNTER — Inpatient Hospital Stay (HOSPITAL_COMMUNITY)
Admission: RE | Admit: 2022-04-04 | Discharge: 2022-04-04 | Disposition: A | Discharge status: Home / Self Care | Payer: Medicare Other | Source: Ambulatory Visit | Attending: Internal Medicine | Admitting: Internal Medicine

## 2022-04-04 ENCOUNTER — Ambulatory Visit (HOSPITAL_BASED_OUTPATIENT_CLINIC_OR_DEPARTMENT_OTHER)
Admission: RE | Admit: 2022-04-04 | Discharge: 2022-04-04 | Disposition: A | Discharge status: Home / Self Care | Payer: Medicare Other | Source: Ambulatory Visit | Attending: Internal Medicine | Admitting: Internal Medicine

## 2022-04-04 ENCOUNTER — Ambulatory Visit (HOSPITAL_COMMUNITY)
Admission: RE | Admit: 2022-04-04 | Discharge: 2022-04-04 | Disposition: A | Discharge status: Home / Self Care | Payer: Medicare Other | Source: Ambulatory Visit | Attending: Internal Medicine | Admitting: Internal Medicine

## 2022-04-04 VITALS — BP 130/84 | HR 122 | Wt 133.4 lb

## 2022-04-04 DIAGNOSIS — I4892 Unspecified atrial flutter: Secondary | ICD-10-CM | POA: Diagnosis not present

## 2022-04-04 DIAGNOSIS — I2510 Atherosclerotic heart disease of native coronary artery without angina pectoris: Secondary | ICD-10-CM | POA: Diagnosis not present

## 2022-04-04 DIAGNOSIS — R000 Tachycardia, unspecified: Secondary | ICD-10-CM | POA: Diagnosis not present

## 2022-04-04 DIAGNOSIS — I5032 Chronic diastolic (congestive) heart failure: Secondary | ICD-10-CM | POA: Insufficient documentation

## 2022-04-04 DIAGNOSIS — N179 Acute kidney failure, unspecified: Secondary | ICD-10-CM | POA: Diagnosis not present

## 2022-04-04 DIAGNOSIS — I130 Hypertensive heart and chronic kidney disease with heart failure and stage 1 through stage 4 chronic kidney disease, or unspecified chronic kidney disease: Secondary | ICD-10-CM | POA: Insufficient documentation

## 2022-04-04 DIAGNOSIS — I4821 Permanent atrial fibrillation: Secondary | ICD-10-CM

## 2022-04-04 DIAGNOSIS — Z7901 Long term (current) use of anticoagulants: Secondary | ICD-10-CM | POA: Diagnosis not present

## 2022-04-04 DIAGNOSIS — Z950 Presence of cardiac pacemaker: Secondary | ICD-10-CM

## 2022-04-04 DIAGNOSIS — Z8711 Personal history of peptic ulcer disease: Secondary | ICD-10-CM | POA: Diagnosis not present

## 2022-04-04 DIAGNOSIS — N184 Chronic kidney disease, stage 4 (severe): Secondary | ICD-10-CM | POA: Insufficient documentation

## 2022-04-04 DIAGNOSIS — Z79899 Other long term (current) drug therapy: Secondary | ICD-10-CM | POA: Diagnosis not present

## 2022-04-04 LAB — CBC
HCT: 39.5 % (ref 36.0–46.0)
Hemoglobin: 12.6 g/dL (ref 12.0–15.0)
MCH: 31.7 pg (ref 26.0–34.0)
MCHC: 31.9 g/dL (ref 30.0–36.0)
MCV: 99.2 fL (ref 80.0–100.0)
Platelets: 231 10*3/uL (ref 150–400)
RBC: 3.98 MIL/uL (ref 3.87–5.11)
RDW: 14.7 % (ref 11.5–15.5)
WBC: 6.8 10*3/uL (ref 4.0–10.5)
nRBC: 0 % (ref 0.0–0.2)

## 2022-04-04 LAB — BASIC METABOLIC PANEL
Anion gap: 11 (ref 5–15)
BUN: 36 mg/dL — ABNORMAL HIGH (ref 8–23)
CO2: 22 mmol/L (ref 22–32)
Calcium: 9.2 mg/dL (ref 8.9–10.3)
Chloride: 104 mmol/L (ref 98–111)
Creatinine, Ser: 1.38 mg/dL — ABNORMAL HIGH (ref 0.44–1.00)
GFR, Estimated: 36 mL/min — ABNORMAL LOW (ref >60)
Glucose, Bld: 95 mg/dL (ref 70–99)
Potassium: 3.9 mmol/L (ref 3.5–5.1)
Sodium: 137 mmol/L (ref 135–145)

## 2022-04-04 LAB — BRAIN NATRIURETIC PEPTIDE: B Natriuretic Peptide: 394 pg/mL — ABNORMAL HIGH (ref 0.0–100.0)

## 2022-04-04 LAB — ECHOCARDIOGRAM COMPLETE
P 1/2 time: 381 msec
S' Lateral: 2.1 cm

## 2022-04-04 NOTE — Patient Instructions (Signed)
There has been no changes to your medications.  Labs done today, your results will be available in MyChart, we will contact you for abnormal readings.  Your provider has recommended that  you wear a Zio Patch for 14 days.  This monitor will record your heart rhythm for our review.  IF you have any symptoms while wearing the monitor please press the button.  If you have any issues with the patch or you notice a red or orange light on it please call the company at [phone].  Once you remove the patch please mail it back to the company as soon as possible so we can get the results.  Your physician recommends that you schedule a follow-up appointment in: 1 week at the [facility] office for an EKG. You will be called to have this appointment arranged.  If you have any questions or concerns before your next appointment please send [place] a message through [place] or call our office at [phone].    TO LEAVE A MESSAGE FOR THE NURSE SELECT OPTION 2, PLEASE LEAVE A MESSAGE INCLUDING: YOUR NAME DATE OF BIRTH CALL BACK NUMBER REASON FOR CALL**this is important as we prioritize the call backs  YOU WILL RECEIVE A CALL BACK THE SAME DAY AS LONG AS YOU CALL BEFORE 4:00 PM  At the [facility], you and your health needs are our priority. As part of our continuing mission to provide you with exceptional heart care, we have created designated Provider Care Teams. These Care Teams include your primary Cardiologist (physician) and Advanced Practice Providers (APPs- Physician Assistants and Nurse Practitioners) who all work together to provide you with the care you need, when you need it.   You may see any of the following providers on your designated Care Team at your next follow up: Dr Glori Bickers Dr Loralie Champagne Dr. Roxana Hires, NP Lyda Jester, [place] [facility] [place], [place] Forestine Na, NP Audry Riles, PharmD   Please be sure to bring in all your  medications bottles to every appointment.

## 2022-04-04 NOTE — Progress Notes (Signed)
Advanced Heart Failure Clinic Note   Date:  04/04/2022   ID:  Ruth Roth, DOB 09/03/1928, MRN [id number]  Location: Home  Provider location: [place] [facility] Type of Visit: Established patient  PCP:  Einar Pheasant, MD  Cardiologist:  [name], MD Primary HF: [place] [facility]rology: Dr. Holley Raring  Chief Complaint: Heart Failure follow-up   History of Present Illness: Ruth Roth is a 89 y.o. female with a history of CAD, chronic diastolic HF with severe mitral reguritationm, chronic atrial fibrillation, hypertension, peptic ulcer disease.   Admitted [facility] 08/21/17 s/p R/LHC which showed significantly elevated filling pressures, severe MR, and low cardiac output, and then transferred to [facility] 6/14 for consideration of MitraClip.  AKI noted on admission felt to be due to cardiorenal syndrome. She required milrinone and lasix for diuresis. Diuresed 25 lbs and transitioned to 20 mg torsemide daily. Case and images reviewed at length with structural heart team. MR felt to be truly functional with improvement from hemodynamic optimization. Not felt to be MitraClip candidate. Discharge weight: 121 lbs.    In 2020 had recurrent AF with RVR Amio 200 bid started. Underwent DC-CV on 9/210/20. PCP cut back torsemide to 20 qOD due to AKI but developed volume overload. Torsemide increased back to 20 daily. Developed recurrent AF and Amio stopped. Saw Dr. Fletcher Anon in 04/30/19 and metoprolol increased to 75 bid for better rate control. Says she felt better on the 50 but is tolerating this ok.  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   Here with friend for f/u. Overall feels good. SOB only with exertion. Feels her heart intermittently race, says this is pretty normal for her. Saw her PCP Monday and her HR was found to be in 110s, they increased her diltiazem back to '240mg'$  as it had recently been decreased 2/2 leg swelling. Taking her diuretic PRN, last took Monday.    Echo today EF 60-65%, RV normal, LA/RA severely dilated, mild MR, mild-mod TR EKG showed atrial flutter 2:1  Cardiac Studies Echo 03/16/21 EF 60-65% RV normal. Mild MR/AI  Echo 12/21 EF 55-60% Moderate RV HK.  Echo 10/09/17: EF 55-60%, no AI, mild to mod MR, LA moderately dilated, RA mildly dilated, RV normal, PA peak pressure 52 mmHg   [facility] 08/21/2017 - Mild non-obstructive CAD RHC Procedural Findings: (Obtained from Scan 08/21/17 at 1030) Hemodynamics (mmHg) RA mean 17 RV 68/11 (19) PA 62/27 (44) PCWP 41  AO 147/67 (102) Cardiac Output (Fick) 2.45 Cardiac Index  (Fick) 1.46 PVR 1.23 WU   Echo 08/01/17 LVEF 50-55%, Mild AS, Mod MR, Mod LAE, Mild RAE, Mod TR, PA peak pressure 65 mm Hg. MR ++ calcified, and RV mild/moderately reduced function.       Past Medical History:  Diagnosis Date   (HFpEF) heart failure with preserved ejection fraction ([place])    a. 07/2017 Echo: EF 50-55%, no rwma, mild AS/AI, mod MR, mod dil LA, mod TR, PASP 78mHg; b. 03/2021 Echo: EF 60-65%. No rwma. Nl RV fxn. Sev BAE. Mild MR. Mild AI/AS.   Autoimmune hepatitis ([place]    followed by Dr SGustavo Lah  Fibrocystic breast disease    Glaucoma    Hypercholesterolemia    Hypertension    Hypothyroidism    Inflammatory arthritis    MI (myocardial infarction) ([place]    Mitral regurgitation    a. 07/2017 Echo: Mod MR; b. 08/2017 Cath: 4+/Severe MR; c. 08/2017 TEE: Mild MR; d. 03/2021 Echo: Mild  MR.   Neuropathy    Non-obstructive Coronary artery disease    a. 05/2014 NSTEMI/Cath: LM nl, LAD mild dzs, LCX 60ost hazy (? culprit), RCA mild dzs. EF 60% by echo-->Med Rx; b. 08/2017 Cath: LM 30ost, LAD min irregs, LCX small, 40ost/p, RCA large, min irregs, RPDA/RPL1 nl, EF 50-55%. 4+MR.   Osteoarthritis    Permanent atrial fibrillation (HCC)    a. CHA2DS2VASc = 6-->Xarelto.   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   Past Surgical History:  Procedure Laterality Date    ABDOMINAL HYSTERECTOMY  1963   partial, secondary to fibroids   APPENDECTOMY     Billroth     CARDIAC CATHETERIZATION  05/12/14   [facility]   CARDIOVERSION N/A 12/26/2016   Procedure: CARDIOVERSION;  Surgeon: [name], MD;  Location: [facility] ORS;  Service: Cardiovascular;  Laterality: N/A;   CARDIOVERSION N/A 05/16/2017   Procedure: CARDIOVERSION;  Surgeon: [name], MD;  Location: [facility] ORS;  Service: Cardiovascular;  Laterality: N/A;   CARDIOVERSION N/A 06/09/2017   Procedure: CARDIOVERSION;  Surgeon: [name], MD;  Location: [facility] ORS;  Service: Cardiovascular;  Laterality: N/A;   CARDIOVERSION N/A 11/30/2018   Procedure: CARDIOVERSION;  Surgeon: [name], MD;  Location: [facility] ORS;  Service: Cardiovascular;  Laterality: N/A;   [place]  2009   PACEMAKER IMPLANT N/A 09/28/2021   Procedure: PACEMAKER IMPLANT;  Surgeon: Vickie Epley, MD;  Location: [place] CV LAB;  Service: Cardiovascular;  Laterality: N/A;   RIGHT/LEFT HEART CATH AND CORONARY ANGIOGRAPHY Bilateral 08/21/2017   Procedure: RIGHT/LEFT HEART CATH AND CORONARY ANGIOGRAPHY;  Surgeon: [name], MD;  Location: [place] CV LAB;  Service: Cardiovascular;  Laterality: Bilateral;   TEE WITHOUT CARDIOVERSION N/A 08/25/2017   Procedure: TRANSESOPHAGEAL ECHOCARDIOGRAM (TEE);  Surgeon: Jolaine Artist, MD;  Location: [facility] ENDOSCOPY;  Service: Cardiovascular;  Laterality: N/A;   UPPER GI ENDOSCOPY  2004     Current Outpatient Medications  Medication Sig Dispense Refill   allopurinol (ZYLOPRIM) 100 MG tablet TAKE 1/2 TABLET(50 MG) BY MOUTH DAILY 45 tablet 1   diltiazem (CARDIZEM CD) 180 MG 24 hr capsule Take 1 capsule (180 mg total) by mouth daily. 90 capsule 2   dorzolamide (TRUSOPT) 2 % ophthalmic solution Place 1 drop into the left eye 2 (two) times daily.     fluticasone (FLONASE) 50 MCG/ACT nasal spray Place 2 sprays into both nostrils daily as needed for  allergies.     gabapentin (NEURONTIN) 300 MG capsule One capsule tid and two capsules q hs. 150 capsule 2   hydroxychloroquine (PLAQUENIL) 200 MG tablet Take 200 mg by mouth every other day.     ipratropium (ATROVENT) 0.03 % nasal spray Place 2 sprays into both nostrils 3 (three) times daily as needed for rhinitis.     latanoprost (XALATAN) 0.005 % ophthalmic solution Place 1 drop into both eyes at bedtime.      levothyroxine (SYNTHROID) 125 MCG tablet Take 1 tablet (125 mcg total) by mouth daily. 90 tablet 3   metoprolol tartrate (LOPRESSOR) 100 MG tablet TAKE 1 AND 1/2 TABLETS(150 MG) BY MOUTH TWICE DAILY 270 tablet 1   mometasone (ELOCON) 0.1 % cream Apply 1 Application topically daily. Appear pea sized ( or less) amount to external ear, and slightly in canal. 15 g 0   Multiple Vitamin (MULTI-VITAMIN) tablet Take 1 tablet by mouth at bedtime.  NEOMYCIN-POLYMYXIN-HYDROCORTISONE (CORTISPORIN) 1 % SOLN OTIC solution Place 3 drops into the left ear 4 (four) times daily. 10 mL 0   Probiotic Product (ALIGN) 4 MG CAPS Take one capsule q day 30 capsule 0   Rivaroxaban (XARELTO) 15 MG TABS tablet TAKE 1 TABLET(15 MG) BY MOUTH DAILY WITH SUPPER 90 tablet 1   torsemide (DEMADEX) 20 MG tablet Take 1 tablet (20 mg total) by mouth every other day. 45 tablet 3   traMADol (ULTRAM) 50 MG tablet Take 1 tablet (50 mg total) by mouth 2 (two) times daily as needed (for pain.). 60 tablet 1   ursodiol (ACTIGALL) 300 MG capsule Take 300-600 mg by mouth See admin instructions. Take 2 capsules (600 mg) daily in the morning   & 1 capsule (300 mg) at night.     vitamin B-12 (CYANOCOBALAMIN) 500 MCG tablet Take 500 mcg by mouth every Monday.     No current facility-administered medications for this encounter.    Allergies:   Statins, Hydroxychloroquine, Haldol [haloperidol lactate], Librax [chlordiazepoxide-clidinium], Plavix [clopidogrel bisulfate], and Ramipril   Social History:  The patient  reports that she has  never smoked. She has never used smokeless tobacco. She reports that she does not drink alcohol and does not use drugs.   Family History:  The patient's family history includes Colon cancer in her daughter; Esophageal cancer in her brother; Ovarian cancer in her daughter; Stroke in her mother.   ROS:  Please see the history of present illness.   All other systems are personally reviewed and negative.   There were no vitals filed for this visit.  EKG: Atrial flutter 2:1 121 bpm  Exam:   General:  elderly appearing.  No respiratory difficulty HEENT: normal Neck: supple. No JVD. Carotids 2+ bilat; no bruits. No lymphadenopathy or thyromegaly appreciated. Cor: PMI nondisplaced. Irregular rate & rhythm. No rubs, gallops or murmurs. Lungs: clear Abdomen: soft, nontender, nondistended. No hepatosplenomegaly. No bruits or masses. Good bowel sounds. Extremities: no cyanosis, clubbing, rash, non-pitting bilateral ankle edema  Neuro: alert & oriented x 3, cranial nerves grossly intact. moves all 4 extremities w/o difficulty. Affect pleasant.   Recent Labs: 10/11/2021: Hemoglobin 12.6; Platelets 194.0 04/02/2022: ALT 15; BUN 39; Creatinine, Ser 1.27; Potassium 5.6 No hemolysis seen; Sodium 138; TSH 3.41  Personally reviewed   Wt Readings from Last 3 Encounters:  04/02/22 61.4 kg (135 lb 6.4 oz)  02/15/22 61.9 kg (136 lb 6.4 oz)  02/14/22 61.9 kg (136 lb 6.4 oz)     ASSESSMENT AND PLAN: 1.  Chronic diastolic congestive heart failure with h/o low output:  - Echo 08/01/17 LVEF 50-55%, Mild AS, Mod MR, Mod LAE, Mild RAE, Mod TR, RV moderately HK PA peak pressure 65 mm Hg. - Cath 08/21/17 with signficantly elevated filling pressures, severe MR, and low cardiac output. Had cardiorenal syndrome on admit with severe MR and RV failure and required milrinone - Echo 10/09/17: EF 55-60%, mild to mod MR - Echo 12/20 EF 60-65% mod MR  - Echo 12/21 EF 55-60% Moderate RV HK.  Moderate MR  - Echo 03/16/21 EF 60-65%  RV normal. Mild MR/AI Personally reviewed - Echo today EF 60-65%, RV normal, LA/RA severely dilated, mild MR, mild-mod TR - Stable NYHA II. Volume status looks good, previously on torsemide 40 daily alternating with 20 daily, has been taking PRN appears to be working for her. - Repeat BMET with recent K elevation at PCP  2.  Severe mitral regurgitation:  - Echo 08/01/17  LVEF 50-55%, Mild AS, Mod MR, Mod LAE, Mild RAE, Mod TR, PA peak pressure 65 mm Hg. MR ++ calcified, and RV mild/moderately reduced function.  - TEE 6/17 showed only mild MR with restricted posterior leaflet.  -Echo 10/09/17: EF 55-60%, mild to mod MR  - Echo 12/20 EF 60-65% mod MR  - Echo 03/16/21 EF 60-65% RV normal. Mild MR/AI  - Echo today EF 60-65%, RV normal, mild MR - Likely functional MR and MR has improved with hemodynamic optimization.  Doing well now. MR mild.    3.  Chronic atrial fibrillation with tachy/brady syndrome / Atrial Flutter  - Has previously failed amiodarone and DCCV x3.  - Continue Xarelto. No bleeding. CBC today - Zio 9/21 Chronic AF. Mean HR 90.  - Now s/p PPM in 7/23. Device unable to fully be interrogated today.  - Currently on 240 diltiazem and 150 metoprolol BID - EKG showed atrial flutter 2:1. Has Hx of same. - Attempted to pace her out of rhythm, unsuccessful. Had been in it since this morning, previously rate controlled for the most part.  - Will place 2 week zio to better access burden - Will f/u in 1 week for repeat EKG - Followed by EP  4.  Essential hypertension:  - Blood pressure well controlled. Continue current regimen.  5. CKD IV with h/o AKI - Creatinine has been stable 1.8-1.9 - Recent labs with Scr 1.27 - labs today  Forestine Na, AGACNP-BC  1:49 PM  Advanced [facility] [place] [address] [phone] (office) [phone] (fax)  Patient seen and examined with the above-signed Advanced Practice  Provider and/or Housestaff. I personally reviewed laboratory data, imaging studies and relevant notes. I independently examined the patient and formulated the important aspects of the plan. I have edited the note to reflect any of my changes or salient points. I have personally discussed the plan with the patient and/or family.  Symptomatically stable. Echo today EF 60%. However now in AFL with RVR (typically in rate-controlled AFib). Diltiazem increased back to 240  Pacer interrogated in clinic today and seems like AFL started this am (also had episode earlier in month). We tried to overdrive pace with no effect  General:  Elderly  No resp difficulty HEENT: normal Neck: supple. no JVD. Carotids 2+ bilat; no bruits. No lymphadenopathy or thryomegaly appreciated. Cor: PMI nondisplaced. Irregular tachy  2/6 AS Lungs: clear Abdomen: soft, nontender, nondistended. No hepatosplenomegaly. No bruits or masses. Good bowel sounds. Extremities: no cyanosis, clubbing, rash, edema Neuro: alert & orientedx3, cranial nerves grossly intact. moves all 4 extremities w/o difficulty. Affect pleasant  Failed overdrive pacing. Will Place zio to further evaluate burden and adequacy of rate control. She will follow HRs on pulse ok at home. Bring to clinic next week for ECG. If AFL with RVR persists may need DC-CV.   Total time spent 45 minutes. Over half that time spent discussing above.    Glori Bickers, MD  3:33 PM

## 2022-04-05 ENCOUNTER — Telehealth: Payer: Self-pay | Admitting: Cardiovascular Disease

## 2022-04-05 NOTE — Telephone Encounter (Signed)
Patient called stating Dr. Haroldine Laws told her to call our office to set up an appt to have an EKG done on Wednesday, because he was going to be in our [place] office.

## 2022-04-05 NOTE — Telephone Encounter (Signed)
Pt is supposed to have EKG at [facility] next week, unsure why it wasn't sch until 2/8, we will call pt and resch for next week when Dr Haroldine Laws is at [facility] clinic

## 2022-04-05 NOTE — Telephone Encounter (Signed)
The patient called in stating that Dr. Haroldine Laws wanted her to have an EKG done in his office next Wednesday 1/30. She has been advised that this was the cardiology office for Dr. Fletcher Anon.   She has an appointment for an EKG on 2/8 at currently. Message sent to the provider to see if he wanted one done earlier.

## 2022-04-08 ENCOUNTER — Telehealth: Payer: Self-pay | Admitting: Internal Medicine

## 2022-04-08 NOTE — Telephone Encounter (Signed)
Spoke to patients son requesting patient come in this week perferably on 1/31 to do her EKG nurse visit instead of 2/8/ Patients son JV will talk to his mom and have her call us back to confirm if she can make it or not.   Rosaly Labarbera, NT

## 2022-04-10 ENCOUNTER — Ambulatory Visit: Payer: Medicare Other | Attending: Internal Medicine | Admitting: Internal Medicine

## 2022-04-10 DIAGNOSIS — I482 Chronic atrial fibrillation, unspecified: Secondary | ICD-10-CM | POA: Insufficient documentation

## 2022-04-10 DIAGNOSIS — I4892 Unspecified atrial flutter: Secondary | ICD-10-CM | POA: Insufficient documentation

## 2022-04-10 DIAGNOSIS — Z950 Presence of cardiac pacemaker: Secondary | ICD-10-CM | POA: Insufficient documentation

## 2022-04-10 NOTE — Progress Notes (Signed)
Pt in for repeat EKG, EKG with afib, reviewed by Dr Haroldine Laws, HR has been under control at home as well. No changes, f/u in 2 months, appt sch

## 2022-04-10 NOTE — Progress Notes (Signed)
ECG shows patient back in her chronic AFib (AFL with RVR no longer present)  HRs at home back to baseline 70-80s   Glori Bickers, MD  5:40 PM

## 2022-04-18 ENCOUNTER — Other Ambulatory Visit: Payer: Self-pay | Admitting: Physician Assistant

## 2022-04-18 DIAGNOSIS — M159 Polyosteoarthritis, unspecified: Secondary | ICD-10-CM | POA: Diagnosis not present

## 2022-04-18 DIAGNOSIS — Z8739 Personal history of other diseases of the musculoskeletal system and connective tissue: Secondary | ICD-10-CM | POA: Diagnosis not present

## 2022-04-18 DIAGNOSIS — M4726 Other spondylosis with radiculopathy, lumbar region: Secondary | ICD-10-CM | POA: Diagnosis not present

## 2022-04-29 DIAGNOSIS — I4892 Unspecified atrial flutter: Secondary | ICD-10-CM | POA: Diagnosis not present

## 2022-04-29 NOTE — Addendum Note (Signed)
Encounter addended by: Micki Riley, RN on: 04/29/2022 1:03 PM  Actions taken: Imaging Exam ended

## 2022-05-01 DIAGNOSIS — M810 Age-related osteoporosis without current pathological fracture: Secondary | ICD-10-CM | POA: Diagnosis not present

## 2022-05-20 NOTE — Progress Notes (Signed)
Remote pacemaker transmission.   

## 2022-06-03 ENCOUNTER — Ambulatory Visit: Payer: Medicare Other | Admitting: Internal Medicine

## 2022-06-10 ENCOUNTER — Ambulatory Visit: Payer: Medicare Other | Admitting: Internal Medicine

## 2022-06-11 ENCOUNTER — Telehealth: Payer: Self-pay | Admitting: Internal Medicine

## 2022-06-11 NOTE — Telephone Encounter (Signed)
Contacted Ruth Roth to schedule their annual wellness visit. Appointment made for 06/17/2022.  Thank you,  [place] Direct dial  [phone]

## 2022-06-12 ENCOUNTER — Ambulatory Visit: Payer: Medicare Other | Attending: Internal Medicine | Admitting: Internal Medicine

## 2022-06-12 ENCOUNTER — Encounter: Payer: Self-pay | Admitting: Oncology

## 2022-06-12 VITALS — BP 144/94 | HR 90

## 2022-06-12 DIAGNOSIS — Z950 Presence of cardiac pacemaker: Secondary | ICD-10-CM | POA: Insufficient documentation

## 2022-06-12 DIAGNOSIS — R000 Tachycardia, unspecified: Secondary | ICD-10-CM | POA: Diagnosis not present

## 2022-06-12 DIAGNOSIS — I5032 Chronic diastolic (congestive) heart failure: Secondary | ICD-10-CM | POA: Insufficient documentation

## 2022-06-12 DIAGNOSIS — Z8711 Personal history of peptic ulcer disease: Secondary | ICD-10-CM | POA: Diagnosis not present

## 2022-06-12 DIAGNOSIS — Z7901 Long term (current) use of anticoagulants: Secondary | ICD-10-CM | POA: Diagnosis not present

## 2022-06-12 DIAGNOSIS — N184 Chronic kidney disease, stage 4 (severe): Secondary | ICD-10-CM | POA: Diagnosis not present

## 2022-06-12 DIAGNOSIS — N179 Acute kidney failure, unspecified: Secondary | ICD-10-CM | POA: Diagnosis not present

## 2022-06-12 DIAGNOSIS — I4892 Unspecified atrial flutter: Secondary | ICD-10-CM | POA: Diagnosis not present

## 2022-06-12 DIAGNOSIS — Z79899 Other long term (current) drug therapy: Secondary | ICD-10-CM | POA: Insufficient documentation

## 2022-06-12 DIAGNOSIS — I130 Hypertensive heart and chronic kidney disease with heart failure and stage 1 through stage 4 chronic kidney disease, or unspecified chronic kidney disease: Secondary | ICD-10-CM | POA: Insufficient documentation

## 2022-06-12 DIAGNOSIS — I4821 Permanent atrial fibrillation: Secondary | ICD-10-CM

## 2022-06-12 DIAGNOSIS — I2510 Atherosclerotic heart disease of native coronary artery without angina pectoris: Secondary | ICD-10-CM | POA: Diagnosis not present

## 2022-06-12 DIAGNOSIS — I340 Nonrheumatic mitral (valve) insufficiency: Secondary | ICD-10-CM | POA: Diagnosis not present

## 2022-06-12 NOTE — Progress Notes (Signed)
Advanced Heart Failure Clinic Note   Date:  06/12/2022   ID:  Ruth Roth, DOB 1928-08-05, MRN [id number]  Location: Home  Provider location: [facility] Type of Visit: Established patient  PCP:  Einar Pheasant, MD  Cardiologist:  [name], MD Primary HF: [place] [facility]rology: Dr. Holley Raring  Chief Complaint: Heart Failure follow-up   History of Present Illness: Ruth Roth is a 89 y.o. female with a history of CAD, chronic diastolic HF with severe mitral reguritationm, chronic atrial fibrillation, hypertension, peptic ulcer disease.   Admitted [facility] 08/21/17 s/p R/LHC which showed significantly elevated filling pressures, severe MR, and low cardiac output, and then transferred to [facility] 6/14 for consideration of MitraClip.  AKI noted on admission felt to be due to cardiorenal syndrome. She required milrinone and lasix for diuresis. Diuresed 25 lbs and transitioned to 20 mg torsemide daily. Case and images reviewed at length with structural heart team. MR felt to be truly functional with improvement from hemodynamic optimization. Not felt to be MitraClip candidate. Discharge weight: 121 lbs.    In 2020 had recurrent AF with RVR Amio 200 bid started. Underwent DC-CV on 9/210/20. PCP cut back torsemide to 20 qOD due to AKI but developed volume overload. Torsemide increased back to 20 daily. Developed recurrent AF and Amio stopped. Saw Dr. Fletcher Anon in 04/30/19 and metoprolol increased to 75 bid for better rate control. Says she felt better on the 50 but is tolerating this ok.  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   In 1/24 was seen in office and was in rapid AFL 120s (usually in rate controlled AF). Failed pace-termination efforts. Zio placed - Zio AF/AFL occurred continuously (100% burden), ranging from 68-159 bpm (avg of 80 bpm).   Echo 1/24 EF 60-65% mild MR  Here for f/u. Feels good. Having occasional palpitations and will notice HR is up  at times on BP cuff but typically 80-90s otherwise. Having some mild ankle edema from diltiazem. Remains active. No CP or SOB. No bleeding.  [facility] 08/21/2017 - Mild non-obstructive CAD RHC Procedural Findings: (Obtained from Scan 08/21/17 at 1030) Hemodynamics (mmHg) RA mean 17 RV 68/11 (19) PA 62/27 (44) PCWP 41  AO 147/67 (102) Cardiac Output (Fick) 2.45 Cardiac Index  (Fick) 1.46 PVR 1.23 WU     Past Medical History:  Diagnosis Date   (HFpEF) heart failure with preserved ejection fraction ([place])    a. 07/2017 Echo: EF 50-55%, no rwma, mild AS/AI, mod MR, mod dil LA, mod TR, PASP 108mmHg; b. 03/2021 Echo: EF 60-65%. No rwma. Nl RV fxn. Sev BAE. Mild MR. Mild AI/AS.   Autoimmune hepatitis ([place])    followed by Dr [name]   Fibrocystic breast disease    Glaucoma    Hypercholesterolemia    Hypertension    Hypothyroidism    Inflammatory arthritis    MI (myocardial infarction) ([place])    Mitral regurgitation    a. 07/2017 Echo: Mod MR; b. 08/2017 Cath: 4+/Severe MR; c. 08/2017 TEE: Mild MR; d. 03/2021 Echo: Mild MR.   Neuropathy    Non-obstructive Coronary artery disease    a. 05/2014 NSTEMI/Cath: LM nl, LAD mild dzs, LCX 60ost hazy (? culprit), RCA mild dzs. EF 60% by echo-->Med Rx; b. 08/2017 Cath: LM 30ost, LAD min irregs, LCX small, 40ost/p, RCA large, min irregs, RPDA/RPL1 nl, EF 50-55%. 4+MR.   Osteoarthritis    Permanent atrial fibrillation (HCC)    a. CHA2DS2VASc = 6-->Xarelto.  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   Past Surgical History:  Procedure Laterality Date   ABDOMINAL HYSTERECTOMY  1963   partial, secondary to fibroids   APPENDECTOMY     Billroth     CARDIAC CATHETERIZATION  05/12/14   [facility]   CARDIOVERSION N/A 12/26/2016   Procedure: CARDIOVERSION;  Surgeon: [name], MD;  Location: [facility] ORS;  Service: Cardiovascular;  Laterality: N/A;   CARDIOVERSION N/A 05/16/2017   Procedure: CARDIOVERSION;  Surgeon:  [name], MD;  Location: [facility] ORS;  Service: Cardiovascular;  Laterality: N/A;   CARDIOVERSION N/A 06/09/2017   Procedure: CARDIOVERSION;  Surgeon: [name], MD;  Location: [facility] ORS;  Service: Cardiovascular;  Laterality: N/A;   CARDIOVERSION N/A 11/30/2018   Procedure: CARDIOVERSION;  Surgeon: [name], MD;  Location: [facility] ORS;  Service: Cardiovascular;  Laterality: N/A;   [place]  2009   PACEMAKER IMPLANT N/A 09/28/2021   Procedure: PACEMAKER IMPLANT;  Surgeon: Vickie Epley, MD;  Location: [place] CV LAB;  Service: Cardiovascular;  Laterality: N/A;   RIGHT/LEFT HEART CATH AND CORONARY ANGIOGRAPHY Bilateral 08/21/2017   Procedure: RIGHT/LEFT HEART CATH AND CORONARY ANGIOGRAPHY;  Surgeon: [name], MD;  Location: [place] CV LAB;  Service: Cardiovascular;  Laterality: Bilateral;   TEE WITHOUT CARDIOVERSION N/A 08/25/2017   Procedure: TRANSESOPHAGEAL ECHOCARDIOGRAM (TEE);  Surgeon: Jolaine Artist, MD;  Location: [facility] ENDOSCOPY;  Service: Cardiovascular;  Laterality: N/A;   UPPER GI ENDOSCOPY  2004     Current Outpatient Medications  Medication Sig Dispense Refill   allopurinol (ZYLOPRIM) 100 MG tablet TAKE 1/2 TABLET(50 MG) BY MOUTH DAILY 45 tablet 1   diltiazem (CARDIZEM CD) 240 MG 24 hr capsule Take 240 mg by mouth daily.     dorzolamide (TRUSOPT) 2 % ophthalmic solution Place 1 drop into the left eye 2 (two) times daily.     fluticasone (FLONASE) 50 MCG/ACT nasal spray Place 2 sprays into both nostrils daily as needed for allergies.     gabapentin (NEURONTIN) 300 MG capsule One capsule tid and two capsules q hs. 150 capsule 2   hydroxychloroquine (PLAQUENIL) 200 MG tablet Take 200 mg by mouth every other day.     ipratropium (ATROVENT) 0.03 % nasal spray Place 2 sprays into both nostrils 3 (three) times daily as needed for rhinitis.     latanoprost (XALATAN) 0.005 % ophthalmic solution Place 1 drop into both eyes  at bedtime.      levothyroxine (SYNTHROID) 125 MCG tablet Take 1 tablet (125 mcg total) by mouth daily. 90 tablet 3   metoprolol tartrate (LOPRESSOR) 100 MG tablet TAKE 1 AND 1/2 TABLETS(150 MG) BY MOUTH TWICE DAILY 270 tablet 1   mometasone (ELOCON) 0.1 % cream Apply 1 Application topically daily. Appear pea sized ( or less) amount to external ear, and slightly in canal. 15 g 0   Multiple Vitamin (MULTI-VITAMIN) tablet Take 1 tablet by mouth at bedtime.      NEOMYCIN-POLYMYXIN-HYDROCORTISONE (CORTISPORIN) 1 % SOLN OTIC solution Place 3 drops into the left ear 4 (four) times daily. 10 mL 0   Probiotic Product (ALIGN) 4 MG CAPS Take one capsule q day 30 capsule 0   Rivaroxaban (XARELTO) 15 MG TABS tablet TAKE 1 TABLET(15 MG) BY MOUTH DAILY WITH SUPPER 90 tablet 1   torsemide (DEMADEX) 20 MG tablet Take 1 tablet (20 mg total) by mouth every other day. 45 tablet  3   traMADol (ULTRAM) 50 MG tablet Take 1 tablet (50 mg total) by mouth 2 (two) times daily as needed (for pain.). 60 tablet 1   ursodiol (ACTIGALL) 300 MG capsule Take 300-600 mg by mouth See admin instructions. Take 2 capsules (600 mg) daily in the morning   & 1 capsule (300 mg) at night.     vitamin B-12 (CYANOCOBALAMIN) 500 MCG tablet Take 500 mcg by mouth every Monday.     No current facility-administered medications for this visit.    Allergies:   Statins, Hydroxychloroquine, Haldol [haloperidol lactate], Librax [chlordiazepoxide-clidinium], Plavix [clopidogrel bisulfate], and Ramipril   Social History:  The patient  reports that she has never smoked. She has never used smokeless tobacco. She reports that she does not drink alcohol and does not use drugs.   Family History:  The patient's family history includes Colon cancer in her daughter; Esophageal cancer in her brother; Ovarian cancer in her daughter; Stroke in her mother.   ROS:  Please see the history of present illness.   All other systems are personally reviewed and negative.    Vitals:   06/12/22 1323  BP: (!) 144/94  Pulse: 90  SpO2: 95%    EKG: Atrial flutter 2:1 116 bpm  Exam:   General:  elderly appearing.  No respiratory difficulty HEENT: normal Neck: supple. no JVD. Carotids 2+ bilat; no bruits. No lymphadenopathy or thryomegaly appreciated. Cor: PMI nondisplaced. Tachy regular. No rubs, gallops or murmurs. Lungs: clear Abdomen: soft, nontender, nondistended. No hepatosplenomegaly. No bruits or masses. Good bowel sounds. Extremities: no cyanosis, clubbing, rash, edema Neuro: alert & orientedx3, cranial nerves grossly intact. moves all 4 extremities w/o difficulty. Affect pleasant  ECG: AFL 116 Personally reviewed  Recent Labs: 04/02/2022: ALT 15; TSH 3.41 04/04/2022: B Natriuretic Peptide 394.0; BUN 36; Creatinine, Ser 1.38; Hemoglobin 12.6; Platelets 231; Potassium 3.9; Sodium 137  Personally reviewed   Wt Readings from Last 3 Encounters:  04/04/22 133 lb 6.4 oz (60.5 kg)  04/02/22 135 lb 6.4 oz (61.4 kg)  02/15/22 136 lb 6.4 oz (61.9 kg)     ASSESSMENT AND PLAN: 1.  Chronic diastolic congestive heart failure - Echo 08/01/17 LVEF 50-55%, Mild AS, Mod MR, Mod LAE, Mild RAE, Mod TR, RV moderately HK PA peak pressure 65 mm Hg. - Cath 08/21/17 with signficantly elevated filling pressures, severe MR, and low cardiac output. Had cardiorenal syndrome on admit with severe MR and RV failure and required milrinone - Echo 10/09/17: EF 55-60%, mild to mod MR - Echo 12/20 EF 60-65% mod MR  - Echo 12/21 EF 55-60% Moderate RV HK.  Moderate MR  - Echo 03/16/21 EF 60-65% RV normal. Mild MR/AI Personally reviewed - Echo 1/24 EF 60-65%, RV normal, LA/RA severely dilated, mild MR, mild-mod TR - Stable NYHA II. Volume status looks good. Has been having some LE edema due to diltiazem. Suggested compression hose and can take extra torsemide as needed - Have nt used SGLT2i with age and risk of UTI  2.  Severe mitral regurgitation:  - Echo 08/01/17 LVEF 50-55%, Mild  AS, Mod MR, Mod LAE, Mild RAE, Mod TR, PA peak pressure 65 mm Hg. MR ++ calcified, and RV mild/moderately reduced function.  - TEE 6/17 showed only mild MR with restricted posterior leaflet.  -Echo 10/09/17: EF 55-60%, mild to mod MR  - Echo 12/20 EF 60-65% mod MR  - Echo 03/16/21 EF 60-65% RV normal. Mild MR/AI  - Echo 1/24 EF 60-65%, RV normal,  mild MR - Likely functional MR and MR has improved with hemodynamic optimization.  MR now mild    3.  Chronic atrial fibrillation with tachy/brady syndrome / Atrial Flutter  - Has previously failed amiodarone and DCCV x3.  - Continue Xarelto. No bleeding. CBC today - Zio 9/21 Chronic AF. Mean HR 90.  - Now s/p PPM in 7/23. Device unable to fully be interrogated today.  - Currently on 240 diltiazem and 150 metoprolol BID - Had episode of rapid AFL 1/24 (usually has chronic AF) - Zio AF/AFL occurred continuously (100% burden), ranging from 68-159 bpm (avg of 80 bpm).  - HR overall well controlled but still with bursts of RVR  4.  Essential hypertension:  - Blood pressure well controlled. Continue current regimen.  5. CKD IV with h/o AKI - Creatinine has been stable 1.8-1.9 - Recent labs with Scr 1.38 - labs today  Glori Bickers, MD  1:40 PM   Advanced Heart Failure [place] [address] [phone] (office) [phone] (fax)

## 2022-06-12 NOTE — Patient Instructions (Signed)
Follow up in 4 months   Do the following things EVERYDAY: Weigh yourself in the morning before breakfast. Write it down and keep it in a log. Take your medicines as prescribed Eat low salt foods--Limit salt (sodium) to 2000 mg per day.  Stay as active as you can everyday Limit all fluids for the day to less than 2 liters

## 2022-06-13 ENCOUNTER — Ambulatory Visit (INDEPENDENT_AMBULATORY_CARE_PROVIDER_SITE_OTHER): Payer: Medicare Other | Admitting: Internal Medicine

## 2022-06-13 ENCOUNTER — Encounter: Payer: Self-pay | Admitting: Internal Medicine

## 2022-06-13 VITALS — BP 128/70 | HR 111 | Temp 98.0°F | Resp 16 | Ht 62.0 in | Wt 133.8 lb

## 2022-06-13 DIAGNOSIS — I5032 Chronic diastolic (congestive) heart failure: Secondary | ICD-10-CM | POA: Diagnosis not present

## 2022-06-13 DIAGNOSIS — I10 Essential (primary) hypertension: Secondary | ICD-10-CM | POA: Diagnosis not present

## 2022-06-13 DIAGNOSIS — D509 Iron deficiency anemia, unspecified: Secondary | ICD-10-CM

## 2022-06-13 DIAGNOSIS — M810 Age-related osteoporosis without current pathological fracture: Secondary | ICD-10-CM

## 2022-06-13 DIAGNOSIS — I25118 Atherosclerotic heart disease of native coronary artery with other forms of angina pectoris: Secondary | ICD-10-CM | POA: Diagnosis not present

## 2022-06-13 DIAGNOSIS — I272 Pulmonary hypertension, unspecified: Secondary | ICD-10-CM

## 2022-06-13 DIAGNOSIS — R519 Headache, unspecified: Secondary | ICD-10-CM

## 2022-06-13 DIAGNOSIS — N1832 Chronic kidney disease, stage 3b: Secondary | ICD-10-CM | POA: Diagnosis not present

## 2022-06-13 DIAGNOSIS — E7800 Pure hypercholesterolemia, unspecified: Secondary | ICD-10-CM

## 2022-06-13 DIAGNOSIS — E039 Hypothyroidism, unspecified: Secondary | ICD-10-CM | POA: Diagnosis not present

## 2022-06-13 DIAGNOSIS — I340 Nonrheumatic mitral (valve) insufficiency: Secondary | ICD-10-CM

## 2022-06-13 DIAGNOSIS — K754 Autoimmune hepatitis: Secondary | ICD-10-CM

## 2022-06-13 DIAGNOSIS — R0981 Nasal congestion: Secondary | ICD-10-CM

## 2022-06-13 DIAGNOSIS — K219 Gastro-esophageal reflux disease without esophagitis: Secondary | ICD-10-CM

## 2022-06-13 DIAGNOSIS — I4819 Other persistent atrial fibrillation: Secondary | ICD-10-CM

## 2022-06-13 DIAGNOSIS — K7460 Unspecified cirrhosis of liver: Secondary | ICD-10-CM

## 2022-06-13 MED ORDER — AMOXICILLIN 875 MG PO TABS: 875.0000 mg | ORAL_TABLET | Freq: Two times a day (BID) | ORAL | 0 refills | Status: AC

## 2022-06-13 NOTE — Patient Instructions (Signed)
Robitussin for congestion.    Saline nasal spray - flush nose one to two times per day.

## 2022-06-13 NOTE — Progress Notes (Signed)
Subjective:    [name], female    DOB: [birth year], 89 y.o.   MRN: [id number]  Patient here for  Chief Complaint  Patient presents with   Medical Management of Chronic Issues    HPI Here to follow up regarding CAD, chronic diastolic heart failure and hypertension.  S/p pacemaker placement in July.  Saw cardiology 06/12/22 - reported occasional palpitations and will notice heart rate is up at times on blood pressure cuff.  No chest pain or sob.  Recommended continuing current medication regimen.  She reports taking lasix daily.  Overall breathing stable.  Eating.  No vomiting.  No abdominal pain.  Blood pressure ok.    Past Medical History:  Diagnosis Date   (HFpEF) heart failure with preserved ejection fraction    a. 07/2017 Echo: EF 50-55%, no rwma, mild AS/AI, mod MR, mod dil LA, mod TR, PASP ; b. 03/2021 Echo: EF 60-65%. No rwma. Nl RV fxn. Sev BAE. Mild MR. Mild AI/AS.   Autoimmune hepatitis    followed by Dr Marva Panda   Fibrocystic breast disease    Glaucoma    Hypercholesterolemia    Hypertension    Hypothyroidism    Inflammatory arthritis    MI (myocardial infarction)    Mitral regurgitation    a. 07/2017 Echo: Mod MR; b. 08/2017 Cath: 4+/Severe MR; c. 08/2017 TEE: Mild MR; d. 03/2021 Echo: Mild MR.   Neuropathy    Non-obstructive Coronary artery disease    a. 05/2014 NSTEMI/Cath: LM nl, LAD mild dzs, LCX 60ost hazy (? culprit), RCA mild dzs. EF 60% by echo-->Med Rx; b. 08/2017 Cath: LM 30ost, LAD min irregs, LCX small, 40ost/p, RCA large, min irregs, RPDA/RPL1 nl, EF 50-55%. 4+MR.   Osteoarthritis    Permanent atrial fibrillation    a. CHA2DS2VASc = 6-->Xarelto.   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   Past Surgical History:  Procedure Laterality Date   ABDOMINAL HYSTERECTOMY  1963   partial, secondary to fibroids   APPENDECTOMY     Billroth     CARDIAC CATHETERIZATION  05/12/14   [facility]    CARDIOVERSION N/A 12/26/2016   Procedure: CARDIOVERSION;  Surgeon: Iran Ouch, MD;  Location: [facility] ORS;  Service: Cardiovascular;  Laterality: N/A;   CARDIOVERSION N/A 05/16/2017   Procedure: CARDIOVERSION;  Surgeon: Iran Ouch, MD;  Location: [facility] ORS;  Service: Cardiovascular;  Laterality: N/A;   CARDIOVERSION N/A 06/09/2017   Procedure: CARDIOVERSION;  Surgeon: Iran Ouch, MD;  Location: [facility] ORS;  Service: Cardiovascular;  Laterality: N/A;   CARDIOVERSION N/A 11/30/2018   Procedure: CARDIOVERSION;  Surgeon: Iran Ouch, MD;  Location: [facility] ORS;  Service: Cardiovascular;  Laterality: N/A;   CHOLECYSTECTOMY  1998   COLONOSCOPY  2009   PACEMAKER IMPLANT N/A 09/28/2021   Procedure: PACEMAKER IMPLANT;  Surgeon: Lanier Prude, MD;  Location: [facility]AS[facility];  Service: Cardiovascular;  Laterality: N/A;   RIGHT/LEFT HEART CATH AND CORONARY ANGIOGRAPHY Bilateral 08/21/2017   Procedure: RIGHT/LEFT HEART CATH AND CORONARY ANGIOGRAPHY;  Surgeon: Iran Ouch, MD;  Location: [facility] INVASIVE CV LAB;  Service: Cardiovascular;  Laterality: Bilateral;   TEE WITHOUT CARDIOVERSION N/A 08/25/2017   Procedure: TRANSESOPHAGEAL ECHOCARDIOGRAM (TEE);  Surgeon: Dolores Patty, MD;  Location: [facility] ENDOSCOPY;  Service: Cardiovascular;  Laterality: N/A;   UPPER GI ENDOSCOPY  2004   Family History  Problem Relation Age of Onset   Stroke Mother  Esophageal cancer Brother        also had lung cancer   Ovarian cancer Daughter    Colon cancer Daughter    Breast cancer Neg Hx    Social History   Socioeconomic History   Marital status: Widowed    Spouse name: Not on file   Number of children: 2   Years of education: Not on file   Highest education level: Not on file  Occupational History   Occupation: retired  Tobacco Use   Smoking status: Never   Smokeless tobacco: Never   Tobacco comments:    Never smoke 01/23/22  Vaping Use   Vaping Use: Never used  Substance and  Sexual Activity   Alcohol use: No    Alcohol/week: 0.0 standard drinks of alcohol   Drug use: No   Sexual activity: Never  Other Topics Concern   Not on file  Social History Narrative   Not on file   Social Determinants of Health   Financial Resource Strain: Low Risk  (05/28/2021)   Overall Financial Resource Strain (CARDIA)    Difficulty of Paying Living Expenses: Not hard at all  Food Insecurity: No Food Insecurity (05/28/2021)   Hunger Vital Sign    Worried About Running Out of Food in the Last Year: Never true    Ran Out of Food in the Last Year: Never true  Transportation Needs: No Transportation Needs (05/28/2021)   PRAPARE - Administrator, Civil Service (Medical): No    Lack of Transportation (Non-Medical): No  Physical Activity: Insufficiently Active (05/28/2021)   Exercise Vital Sign    Days of Exercise per Week: 3 days    Minutes of Exercise per Session: 40 min  Stress: No Stress Concern Present (05/28/2021)   [employer] of Occupational Health - Occupational Stress Questionnaire    Feeling of Stress : Not at all  Social Connections: Unknown (05/28/2021)   Social Connection and Isolation Panel [NHANES]    Frequency of Communication with Friends and Family: More than three times a week    Frequency of Social Gatherings with Friends and Family: More than three times a week    Attends Religious Services: Not on Marketing executive or Organizations: Not on file    Attends Banker Meetings: Not on file    Marital Status: Not on file     Review of Systems  Constitutional:  Negative for appetite change and unexpected weight change.  HENT:  Negative for congestion and sinus pressure.   Respiratory:  Negative for cough, chest tightness and shortness of breath.   Cardiovascular:  Negative for chest pain and palpitations.  Gastrointestinal:  Negative for abdominal pain, diarrhea, nausea and vomiting.  Genitourinary:  Negative for  difficulty urinating and dysuria.  Musculoskeletal:  Negative for joint swelling and myalgias.  Skin:  Negative for color change and rash.  Neurological:  Negative for dizziness and headaches.  Psychiatric/Behavioral:  Negative for agitation and dysphoric mood.        Objective:     BP 128/70   Pulse (!) 111   Temp 98 F (36.7 C)   Resp 16   Ht 5\' 2"  (1.575 m)   Wt 133 lb 12.8 oz (60.7 kg)   SpO2 98%   BMI 24.47 kg/m  Wt Readings from Last 3 Encounters:  06/13/22 133 lb 12.8 oz (60.7 kg)  04/04/22 133 lb 6.4 oz (60.5 kg)  04/02/22 135 lb 6.4 oz (  61.4 kg)    Physical Exam Vitals reviewed.  Constitutional:      General: She is not in acute distress.    Appearance: Normal appearance.  HENT:     Head: Normocephalic and atraumatic.     Right Ear: External ear normal.     Left Ear: External ear normal.  Eyes:     General: No scleral icterus.       Right eye: No discharge.        Left eye: No discharge.     Conjunctiva/sclera: Conjunctivae normal.  Neck:     Thyroid: No thyromegaly.  Cardiovascular:     Rate and Rhythm: Normal rate and regular rhythm.  Pulmonary:     Effort: No respiratory distress.     Breath sounds: Normal breath sounds. No wheezing.  Abdominal:     General: Bowel sounds are normal.     Palpations: Abdomen is soft.     Tenderness: There is no abdominal tenderness.  Musculoskeletal:        General: No tenderness.     Cervical back: Neck supple. No tenderness.     Comments: No significant increased swelling.   Lymphadenopathy:     Cervical: No cervical adenopathy.  Skin:    Findings: No erythema or rash.  Neurological:     Mental Status: She is alert.  Psychiatric:        Mood and Affect: Mood normal.        Behavior: Behavior normal.      Outpatient Encounter Medications as of 06/13/2022  Medication Sig   amoxicillin (AMOXIL) 875 MG tablet Take 1 tablet (875 mg total) by mouth 2 (two) times daily for 10 days.   allopurinol (ZYLOPRIM)  100 MG tablet TAKE 1/2 TABLET(50 MG) BY MOUTH DAILY   diltiazem (CARDIZEM CD) 240 MG 24 hr capsule Take 240 mg by mouth daily.   dorzolamide (TRUSOPT) 2 % ophthalmic solution Place 1 drop into the left eye 2 (two) times daily.   fluticasone (FLONASE) 50 MCG/ACT nasal spray Place 2 sprays into both nostrils daily as needed for allergies.   gabapentin (NEURONTIN) 300 MG capsule One capsule tid and two capsules q hs.   hydroxychloroquine (PLAQUENIL) 200 MG tablet Take 200 mg by mouth every other day.   ipratropium (ATROVENT) 0.03 % nasal spray Place 2 sprays into both nostrils 3 (three) times daily as needed for rhinitis.   latanoprost (XALATAN) 0.005 % ophthalmic solution Place 1 drop into both eyes at bedtime.    levothyroxine (SYNTHROID) 125 MCG tablet Take 1 tablet (125 mcg total) by mouth daily.   metoprolol tartrate (LOPRESSOR) 100 MG tablet TAKE 1 AND 1/2 TABLETS(150 MG) BY MOUTH TWICE DAILY   mometasone (ELOCON) 0.1 % cream Apply 1 Application topically daily. Appear pea sized ( or less) amount to external ear, and slightly in canal.   Multiple Vitamin (MULTI-VITAMIN) tablet Take 1 tablet by mouth at bedtime.    NEOMYCIN-POLYMYXIN-HYDROCORTISONE (CORTISPORIN) 1 % SOLN OTIC solution Place 3 drops into the left ear 4 (four) times daily.   Probiotic Product (ALIGN) 4 MG CAPS Take one capsule q day   Rivaroxaban (XARELTO) 15 MG TABS tablet TAKE 1 TABLET(15 MG) BY MOUTH DAILY WITH SUPPER   torsemide (DEMADEX) 20 MG tablet Take 1 tablet (20 mg total) by mouth every other day.   traMADol (ULTRAM) 50 MG tablet Take 1 tablet (50 mg total) by mouth 2 (two) times daily as needed (for pain.).   ursodiol (ACTIGALL) 300 MG  capsule Take 300-600 mg by mouth See admin instructions. Take 2 capsules (600 mg) daily in the morning   & 1 capsule (300 mg) at night.   vitamin B-12 (CYANOCOBALAMIN) 500 MCG tablet Take 500 mcg by mouth every Monday.   No facility-administered encounter medications on file as of  06/13/2022.     Lab Results  Component Value Date   WBC 5.9 06/13/2022   HGB 12.4 06/13/2022   HCT 39.0 06/13/2022   PLT 230.0 06/13/2022   GLUCOSE 95 04/04/2022   CHOL 136 02/27/2022   TRIG 115.0 02/27/2022   HDL 53.40 02/27/2022   LDLDIRECT 143.5 12/17/2012   LDLCALC 59 02/27/2022   ALT 15 04/02/2022   AST 12 04/02/2022   NA 137 04/04/2022   K 3.9 04/04/2022   CL 104 04/04/2022   CREATININE 1.38 (H) 04/04/2022   BUN 36 (H) 04/04/2022   CO2 22 04/04/2022   TSH 3.41 04/02/2022   INR 1.63 12/29/2017   HGBA1C 5.6 01/27/2018    ECHOCARDIOGRAM COMPLETE  Result Date: 04/04/2022    ECHOCARDIOGRAM REPORT   Patient Name:   MICAILA ZIEMBA [facility] Date of Exam: 04/04/2022 Medical Rec #:  [id number]    Height:       62.0 in Accession #:    [id number]   Weight:       135.4 lb Date of Birth:  [birth year]    BSA:          1.620 m Patient Age:    89 years     BP:           136/74 mmHg Patient Gender: F            HR:           121 bpm. Exam Location:  Outpatient Procedure: 2D Echo, Color Doppler and Cardiac Doppler Indications:    CHF  History:        Patient has prior history of Echocardiogram examinations, most                 recent 03/16/2021. CHF, Arrythmias:Atrial Fibrillation; Risk                 Factors:Hypertension.  Sonographer:    Melissa Morford RDCS (AE, PE) Referring Phys: [id number] DANIEL R BENSIMHON IMPRESSIONS  1. Left ventricular ejection fraction, by estimation, is 60 to 65%. The left ventricle has normal function. The left ventricle has no regional wall motion abnormalities. Left ventricular diastolic parameters are indeterminate.  2. Right ventricular systolic function is normal. The right ventricular size is normal.  3. Left atrial size was severely dilated.  4. Right atrial size was severely dilated.  5. The mitral valve is degenerative. Mild mitral valve regurgitation. No evidence of mitral stenosis. Moderate mitral annular calcification.  6. Tricuspid valve regurgitation is mild to moderate.  7.  The aortic valve is tricuspid. There is moderate calcification of the aortic valve. Aortic valve regurgitation is mild. Aortic valve sclerosis/calcification is present, without any evidence of aortic stenosis.  8. The inferior vena cava is dilated in size with <50% respiratory variability, suggesting right atrial pressure of 15 mmHg. FINDINGS  Left Ventricle: Left ventricular ejection fraction, by estimation, is 60 to 65%. The left ventricle has normal function. The left ventricle has no regional wall motion abnormalities. The left ventricular internal cavity size was normal in size. There is  no left ventricular hypertrophy. Left ventricular diastolic function could not be evaluated due to atrial fibrillation. Left ventricular diastolic parameters  are indeterminate. Right Ventricle: The right ventricular size is normal. No increase in right ventricular wall thickness. Right ventricular systolic function is normal. Left Atrium: Left atrial size was severely dilated. Right Atrium: Right atrial size was severely dilated. Pericardium: There is no evidence of pericardial effusion. Mitral Valve: The mitral valve is degenerative in appearance. Moderate mitral annular calcification. Mild mitral valve regurgitation. No evidence of mitral valve stenosis. Tricuspid Valve: The tricuspid valve is normal in structure. Tricuspid valve regurgitation is mild to moderate. No evidence of tricuspid stenosis. Aortic Valve: The aortic valve is tricuspid. There is moderate calcification of the aortic valve. Aortic valve regurgitation is mild. Aortic regurgitation PHT measures 381 msec. Aortic valve sclerosis/calcification is present, without any evidence of aortic stenosis. Pulmonic Valve: The pulmonic valve was normal in structure. Pulmonic valve regurgitation is mild. No evidence of pulmonic stenosis. Aorta: The aortic root is normal in size and structure. Venous: The inferior vena cava is dilated in size with less than 50% respiratory  variability, suggesting right atrial pressure of 15 mmHg. IAS/Shunts: No atrial level shunt detected by color flow Doppler.  LEFT VENTRICLE PLAX 2D LVIDd:         3.50 cm   Diastology LVIDs:         2.10 cm   LV e' medial:  5.77 cm/s LV PW:         0.90 cm   LV e' lateral: 6.53 cm/s LV IVS:        1.10 cm LVOT diam:     1.90 cm LV SV:         36 LV SV Index:   22 LVOT Area:     2.84 cm  RIGHT VENTRICLE RV S prime:     9.03 cm/s TAPSE (M-mode): 1.2 cm LEFT ATRIUM             Index        RIGHT ATRIUM           Index LA diam:        4.10 cm 2.53 cm/m   RA Area:     18.70 cm LA Vol (A2C):   56.3 ml 34.76 ml/m  RA Volume:   50.90 ml  31.43 ml/m LA Vol (A4C):   51.7 ml 31.92 ml/m LA Biplane Vol: 56.4 ml 34.82 ml/m  AORTIC VALVE LVOT Vmax:   75.70 cm/s LVOT Vmean:  55.200 cm/s LVOT VTI:    0.126 m AI PHT:      381 msec  AORTA Ao Root diam: 2.50 cm Ao Asc diam:  2.90 cm TRICUSPID VALVE TR Peak grad:   55.1 mmHg TR Vmax:        371.00 cm/s  SHUNTS Systemic VTI:  0.13 m Systemic Diam: 1.90 cm Arvilla Meres MD Electronically signed by Arvilla Meres MD Signature Date/Time: 04/04/2022/1:58:25 PM    Final        Assessment & Plan:  Nonintractable headache, unspecified chronicity pattern, unspecified headache type -     CBC with Differential/Platelet -     Sedimentation rate  Iron deficiency anemia, unspecified iron deficiency anemia type Assessment & Plan: Has iron deficiency.  Follow cbc and iron studies.   Orders: -     CBC with Differential/Platelet; Future -     IBC + Ferritin; Future  Atherosclerotic heart disease of native coronary artery with other forms of angina pectoris Assessment & Plan: Continue risk factor modification.  Continue metoprolol and xarelto.    Autoimmune hepatitis Assessment &  Plan: Follow liver function tests.  Followed by GI.    Benign hypertension Assessment & Plan: On metoprolol and diltiazem.  Follow pressures. Hold on making any changes.   Orders: -      Basic metabolic panel; Future  Chronic diastolic heart failure Assessment & Plan: Continue to weigh daily.  No evidence of volume overload today. Follow. Takes lasix daily.    Cirrhosis of liver without ascites, unspecified hepatic cirrhosis type Assessment & Plan: Followed by GI.    Stage 3b chronic kidney disease Assessment & Plan:  Avoid antiinflammatories.  Follow metabolic panel.    Gastroesophageal reflux disease, unspecified whether esophagitis present Assessment & Plan: Upper symptoms controlled.  On protonix.    Hypercholesteremia Assessment & Plan: Unable to take statin medication.  Follow lipid panel.   Orders: -     Lipid panel; Future -     Hepatic function panel; Future  Hypothyroidism, unspecified type Assessment & Plan: On thyroid replacement.  Follow tsh.     Osteoporosis without current pathological fracture, unspecified osteoporosis type Assessment & Plan: Has been seeing Dr Tedd SiasSolum.  Prolia.     Persistent atrial fibrillation Assessment & Plan:  Saw cardiology 06/12/22 - reported occasional palpitations and will notice heart rate is up at times on blood pressure cuff.  No chest pain or sob.  Recommended continuing current medication regimen.   Pulmonary hypertension, unspecified Assessment & Plan: Followed by cardiology.    Severe mitral regurgitation Assessment & Plan: No evidence of volume overload.  Continue torsemide.    Nasal congestion Assessment & Plan: Saline nasal spray.  Robitussin if needed.  Follow. Notify if persistent or worsening symptoms.    Other orders -     Amoxicillin; Take 1 tablet (875 mg total) by mouth 2 (two) times daily for 10 days.  Dispense: 14 tablet; Refill: 0     Dale Durhamharlene Emarion Toral, MD

## 2022-06-14 LAB — CBC WITH DIFFERENTIAL/PLATELET
Basophils Absolute: 0.1 10*3/uL (ref 0.0–0.1)
Basophils Relative: 0.9 % (ref 0.0–3.0)
Eosinophils Absolute: 0.2 10*3/uL (ref 0.0–0.7)
Eosinophils Relative: 3.7 % (ref 0.0–5.0)
HCT: 39 % (ref 36.0–46.0)
Hemoglobin: 12.4 g/dL (ref 12.0–15.0)
Lymphocytes Relative: 11 % — ABNORMAL LOW (ref 12.0–46.0)
Lymphs Abs: 0.6 10*3/uL — ABNORMAL LOW (ref 0.7–4.0)
MCHC: 31.8 g/dL (ref 30.0–36.0)
MCV: 101.6 fl — ABNORMAL HIGH (ref 78.0–100.0)
Monocytes Absolute: 1 10*3/uL (ref 0.1–1.0)
Monocytes Relative: 16.3 % — ABNORMAL HIGH (ref 3.0–12.0)
Neutro Abs: 4 10*3/uL (ref 1.4–7.7)
Neutrophils Relative %: 68.1 % (ref 43.0–77.0)
Platelets: 230 10*3/uL (ref 150.0–400.0)
RBC: 3.84 Mil/uL — ABNORMAL LOW (ref 3.87–5.11)
RDW: 16.6 % — ABNORMAL HIGH (ref 11.5–15.5)
WBC: 5.9 10*3/uL (ref 4.0–10.5)

## 2022-06-14 LAB — SEDIMENTATION RATE: Sed Rate: 10 mm/hr (ref 0–30)

## 2022-06-16 ENCOUNTER — Encounter: Payer: Self-pay | Admitting: Internal Medicine

## 2022-06-16 DIAGNOSIS — R0981 Nasal congestion: Secondary | ICD-10-CM | POA: Insufficient documentation

## 2022-06-16 NOTE — Assessment & Plan Note (Signed)
Avoid antiinflammatories.  Follow metabolic panel.  

## 2022-06-16 NOTE — Assessment & Plan Note (Signed)
Has iron deficiency.  Follow cbc and iron studies.  

## 2022-06-16 NOTE — Assessment & Plan Note (Signed)
Continue to weigh daily.  No evidence of volume overload today. Follow. Takes lasix daily.

## 2022-06-16 NOTE — Assessment & Plan Note (Signed)
Followed by cardiology 

## 2022-06-16 NOTE — Assessment & Plan Note (Signed)
Followed by GI

## 2022-06-16 NOTE — Assessment & Plan Note (Signed)
Unable to take statin medication.  Follow lipid panel.  

## 2022-06-16 NOTE — Assessment & Plan Note (Signed)
No evidence of volume overload.  Continue torsemide.  

## 2022-06-16 NOTE — Assessment & Plan Note (Signed)
On thyroid replacement.  Follow tsh.  

## 2022-06-16 NOTE — Assessment & Plan Note (Signed)
On metoprolol and diltiazem.  Follow pressures. Hold on making any changes.

## 2022-06-16 NOTE — Assessment & Plan Note (Signed)
Continue risk factor modification.  Continue metoprolol and xarelto.  

## 2022-06-16 NOTE — Assessment & Plan Note (Signed)
Follow liver function tests.  Followed by GI.  

## 2022-06-16 NOTE — Assessment & Plan Note (Signed)
Upper symptoms controlled.  On protonix.  

## 2022-06-16 NOTE — Assessment & Plan Note (Signed)
Saw cardiology 06/12/22 - reported occasional palpitations and will notice heart rate is up at times on blood pressure cuff.  No chest pain or sob.  Recommended continuing current medication regimen.

## 2022-06-16 NOTE — Assessment & Plan Note (Signed)
Saline nasal spray.  Robitussin if needed.  Follow. Notify if persistent or worsening symptoms.

## 2022-06-16 NOTE — Assessment & Plan Note (Signed)
Has been seeing Dr Solum.  Prolia.   

## 2022-06-17 ENCOUNTER — Telehealth: Payer: Self-pay

## 2022-06-17 ENCOUNTER — Ambulatory Visit (INDEPENDENT_AMBULATORY_CARE_PROVIDER_SITE_OTHER): Payer: Medicare Other

## 2022-06-17 VITALS — Ht 62.0 in | Wt 133.0 lb

## 2022-06-17 DIAGNOSIS — Z0000 Encounter for general adult medical examination without abnormal findings: Secondary | ICD-10-CM | POA: Diagnosis not present

## 2022-06-17 NOTE — Progress Notes (Signed)
Subjective:   Ruth Roth is a 89 y.o. female who presents for Medicare Annual (Subsequent) preventive examination.  Review of Systems    No ROS.  Medicare Wellness Virtual Visit.  Visual/audio telehealth visit, UTA vital signs.   See social history for additional risk factors.   Cardiac Risk Factors include: advanced age (>49men, >51 women)     Objective:    Today's Vitals   06/17/22 1612  Weight: 133 lb (60.3 kg)  Height: 5\' 2"  (1.575 m)   Body mass index is 24.33 kg/m.     06/17/2022    4:19 PM 09/28/2021   12:24 PM 09/17/2021    1:07 PM 05/28/2021    2:57 PM 05/16/2020   11:44 AM 05/09/2020    1:07 PM 01/05/2020    1:26 PM  Advanced Directives  Does Patient Have a Medical Advance Directive? Yes Yes Yes Yes Yes Yes Yes  Type of Advance Directive Living will;Healthcare Power of [employer] Power of [place];Living will Living will;Healthcare Power of [employer] Power of [employer] Power of Attorney Living will;Healthcare Power of [employer] Power of Attorney  Does patient want to make changes to medical advance directive? No - Patient declined No - Patient declined  No - Patient declined No - Patient declined    Copy of Healthcare Power of Attorney in Chart? No - copy requested   No - copy requested No - copy requested      Current Medications (verified) Outpatient Encounter Medications as of 06/17/2022  Medication Sig   allopurinol (ZYLOPRIM) 100 MG tablet TAKE 1/2 TABLET(50 MG) BY MOUTH DAILY   amoxicillin (AMOXIL) 875 MG tablet Take 1 tablet (875 mg total) by mouth 2 (two) times daily for 10 days.   diltiazem (CARDIZEM CD) 240 MG 24 hr capsule Take 240 mg by mouth daily.   dorzolamide (TRUSOPT) 2 % ophthalmic solution Place 1 drop into the left eye 2 (two) times daily.   fluticasone (FLONASE) 50 MCG/ACT nasal spray Place 2 sprays into both nostrils daily as needed for allergies.   gabapentin (NEURONTIN) 300 MG capsule One capsule tid and  two capsules q hs.   hydroxychloroquine (PLAQUENIL) 200 MG tablet Take 200 mg by mouth every other day.   ipratropium (ATROVENT) 0.03 % nasal spray Place 2 sprays into both nostrils 3 (three) times daily as needed for rhinitis.   latanoprost (XALATAN) 0.005 % ophthalmic solution Place 1 drop into both eyes at bedtime.    levothyroxine (SYNTHROID) 125 MCG tablet Take 1 tablet (125 mcg total) by mouth daily.   metoprolol tartrate (LOPRESSOR) 100 MG tablet TAKE 1 AND 1/2 TABLETS(150 MG) BY MOUTH TWICE DAILY   mometasone (ELOCON) 0.1 % cream Apply 1 Application topically daily. Appear pea sized ( or less) amount to external ear, and slightly in canal.   Multiple Vitamin (MULTI-VITAMIN) tablet Take 1 tablet by mouth at bedtime.    NEOMYCIN-POLYMYXIN-HYDROCORTISONE (CORTISPORIN) 1 % SOLN OTIC solution Place 3 drops into the left ear 4 (four) times daily.   Probiotic Product (ALIGN) 4 MG CAPS Take one capsule q day   Rivaroxaban (XARELTO) 15 MG TABS tablet TAKE 1 TABLET(15 MG) BY MOUTH DAILY WITH SUPPER   torsemide (DEMADEX) 20 MG tablet Take 1 tablet (20 mg total) by mouth every other day.   traMADol (ULTRAM) 50 MG tablet Take 1 tablet (50 mg total) by mouth 2 (two) times daily as needed (for pain.).   ursodiol (ACTIGALL) 300 MG capsule Take 300-600 mg by  mouth See admin instructions. Take 2 capsules (600 mg) daily in the morning   & 1 capsule (300 mg) at night.   vitamin B-12 (CYANOCOBALAMIN) 500 MCG tablet Take 500 mcg by mouth every Monday.   No facility-administered encounter medications on file as of 06/17/2022.    Allergies (verified) Statins, Hydroxychloroquine, Haldol [haloperidol lactate], Librax [chlordiazepoxide-clidinium], Plavix [clopidogrel bisulfate], and Ramipril   History: Past Medical History:  Diagnosis Date   (HFpEF) heart failure with preserved ejection fraction    a. 07/2017 Echo: EF 50-55%, no rwma, mild AS/AI, mod MR, mod dil LA, mod TR, PASP ; b. 03/2021 Echo: EF  60-65%. No rwma. Nl RV fxn. Sev BAE. Mild MR. Mild AI/AS.   Autoimmune hepatitis    followed by Dr Marva Panda   Fibrocystic breast disease    Glaucoma    Hypercholesterolemia    Hypertension    Hypothyroidism    Inflammatory arthritis    MI (myocardial infarction)    Mitral regurgitation    a. 07/2017 Echo: Mod MR; b. 08/2017 Cath: 4+/Severe MR; c. 08/2017 TEE: Mild MR; d. 03/2021 Echo: Mild MR.   Neuropathy    Non-obstructive Coronary artery disease    a. 05/2014 NSTEMI/Cath: LM nl, LAD mild dzs, LCX 60ost hazy (? culprit), RCA mild dzs. EF 60% by echo-->Med Rx; b. 08/2017 Cath: LM 30ost, LAD min irregs, LCX small, 40ost/p, RCA large, min irregs, RPDA/RPL1 nl, EF 50-55%. 4+MR.   Osteoarthritis    Permanent atrial fibrillation    a. CHA2DS2VASc = 6-->Xarelto.   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   Past Surgical History:  Procedure Laterality Date   ABDOMINAL HYSTERECTOMY  1963   partial, secondary to fibroids   APPENDECTOMY     Billroth     CARDIAC CATHETERIZATION  05/12/14   [facility]   CARDIOVERSION N/A 12/26/2016   Procedure: CARDIOVERSION;  Surgeon: Iran Ouch, MD;  Location: [facility] ORS;  Service: Cardiovascular;  Laterality: N/A;   CARDIOVERSION N/A 05/16/2017   Procedure: CARDIOVERSION;  Surgeon: Iran Ouch, MD;  Location: [facility] ORS;  Service: Cardiovascular;  Laterality: N/A;   CARDIOVERSION N/A 06/09/2017   Procedure: CARDIOVERSION;  Surgeon: Iran Ouch, MD;  Location: [facility] ORS;  Service: Cardiovascular;  Laterality: N/A;   CARDIOVERSION N/A 11/30/2018   Procedure: CARDIOVERSION;  Surgeon: Iran Ouch, MD;  Location: [facility] ORS;  Service: Cardiovascular;  Laterality: N/A;   CHOLECYSTECTOMY  1998   COLONOSCOPY  2009   PACEMAKER IMPLANT N/A 09/28/2021   Procedure: PACEMAKER IMPLANT;  Surgeon: Lanier Prude, MD;  Location: [facility] INVASIVE CV LAB;  Service: Cardiovascular;  Laterality: N/A;   RIGHT/LEFT HEART CATH  AND CORONARY ANGIOGRAPHY Bilateral 08/21/2017   Procedure: RIGHT/LEFT HEART CATH AND CORONARY ANGIOGRAPHY;  Surgeon: Iran Ouch, MD;  Location: [facility] INVASIVE CV LAB;  Service: Cardiovascular;  Laterality: Bilateral;   TEE WITHOUT CARDIOVERSION N/A 08/25/2017   Procedure: TRANSESOPHAGEAL ECHOCARDIOGRAM (TEE);  Surgeon: Dolores Patty, MD;  Location: [facility] ENDOSCOPY;  Service: Cardiovascular;  Laterality: N/A;   UPPER GI ENDOSCOPY  2004   Family History  Problem Relation Age of Onset   Stroke Mother    Esophageal cancer Brother        also had lung cancer   Ovarian cancer Daughter    Colon cancer Daughter    Breast cancer Neg Hx    Social History   Socioeconomic History   Marital status: Widowed    Spouse name:  Not on file   Number of children: 2   Years of education: Not on file   Highest education level: Not on file  Occupational History   Occupation: retired  Tobacco Use   Smoking status: Never   Smokeless tobacco: Never   Tobacco comments:    Never smoke 01/23/22  Vaping Use   Vaping Use: Never used  Substance and Sexual Activity   Alcohol use: No    Alcohol/week: 0.0 standard drinks of alcohol   Drug use: No   Sexual activity: Never  Other Topics Concern   Not on file  Social History Narrative   Not on file   Social Determinants of Health   Financial Resource Strain: Low Risk  (06/17/2022)   Overall Financial Resource Strain (CARDIA)    Difficulty of Paying Living Expenses: Not hard at all  Food Insecurity: No Food Insecurity (06/17/2022)   Hunger Vital Sign    Worried About Running Out of Food in the Last Year: Never true    Ran Out of Food in the Last Year: Never true  Transportation Needs: No Transportation Needs (06/17/2022)   PRAPARE - Administrator, Civil Service (Medical): No    Lack of Transportation (Non-Medical): No  Physical Activity: Insufficiently Active (06/17/2022)   Exercise Vital Sign    Days of Exercise per Week: 3 days     Minutes of Exercise per Session: 40 min  Stress: No Stress Concern Present (06/17/2022)   [employer] of Occupational Health - Occupational Stress Questionnaire    Feeling of Stress : Not at all  Social Connections: Unknown (06/17/2022)   Social Connection and Isolation Panel [NHANES]    Frequency of Communication with Friends and Family: More than three times a week    Frequency of Social Gatherings with Friends and Family: More than three times a week    Attends Religious Services: Not on file    Active Member of Clubs or Organizations: Not on file    Attends Banker Meetings: Not on file    Marital Status: Not on file    Tobacco Counseling Counseling given: Not Answered Tobacco comments: Never smoke 01/23/22   Clinical Intake:  Pre-visit preparation completed: Yes        Diabetes: No  How often do you need to have someone help you when you read instructions, pamphlets, or other written materials from your doctor or pharmacy?: 1 - Never    Interpreter Needed?: No      Activities of Daily Living    06/17/2022    4:16 PM  In your present state of health, do you have any difficulty performing the following activities:  Hearing? 0  Vision? 0  Difficulty concentrating or making decisions? 0  Walking or climbing stairs? 0  Dressing or bathing? 0  Doing errands, shopping? 0  Preparing Food and eating ? N  Using the Toilet? N  In the past six months, have you accidently leaked urine? Y  Comment Managed with daily pad  Do you have problems with loss of bowel control? N  Managing your Medications? N  Managing your Finances? N  Housekeeping or managing your Housekeeping? N    Patient Care Team: Dale Eveleth, MD as PCP - General (Unknown Physician Specialty) Iran Ouch, MD as PCP - Cardiology (Cardiology) Bensimhon, Bevelyn Buckles, MD as PCP - Advanced Heart Failure (Cardiology) Lanier Prude, MD as PCP - Electrophysiology  (Cardiology) Lemar Livings Merrily Pew, MD (General Surgery) Rickard Patience,  MD as Consulting Physician (Oncology)  Indicate any recent Medical Services you may have received from other than [facility] providers in the past year (date may be approximate).     Assessment:   This is a routine wellness examination for Ruth Roth.  I connected with  Ruth Roth on 06/17/22 by a audio enabled telemedicine application and verified that I am speaking with the correct person using two identifiers.  Patient Location: Home  Provider Location: Office/Clinic  I discussed the limitations of evaluation and management by telemedicine. The patient expressed understanding and agreed to proceed.   Hearing/Vision screen Hearing Screening - Comments:: Patient is able to hear conversational tones without difficulty.  No issues reported.  Vision Screening - Comments:: Followed by [facility]  Wears corrective lenses Cataract extraction, bilateral They have regular follow up with the ophthalmologist    Dietary issues and exercise activities discussed: Current Exercise Habits: Home exercise routine, Type of exercise: walking, Intensity: Mild Regular diet   Goals Addressed               This Visit's Progress     Patient Stated     Maintain Healthy Lifestyle (pt-stated)        Stay active. Healthy diet. Stay hydrated.       Depression Screen    06/17/2022    4:15 PM 04/02/2022    1:40 PM 02/15/2022   12:06 PM 12/19/2021    1:11 PM 08/14/2021    2:35 PM 07/11/2021   11:10 AM 05/28/2021    2:51 PM  PHQ 2/9 Scores  PHQ - 2 Score 0 0 0 1 0 0 0    Fall Risk    06/17/2022    4:16 PM 04/02/2022    1:39 PM 02/15/2022   12:06 PM 12/19/2021    1:11 PM 08/23/2021    1:15 PM  Fall Risk   Falls in the past year? 1 0 0 0 1  Number falls in past yr: 0 0 0  0  Injury with Fall? 0 0 0  0  Risk for fall due to :  No Fall Risks No Fall Risks No Fall Risks History of fall(s)  Follow up Falls evaluation  completed;Falls prevention discussed Falls evaluation completed Falls evaluation completed Falls evaluation completed Falls evaluation completed    FALL RISK PREVENTION PERTAINING TO THE HOME: Home free of loose throw rugs in walkways, pet beds, electrical cords, etc? Yes  Adequate lighting in your home to reduce risk of falls? Yes   ASSISTIVE DEVICES UTILIZED TO PREVENT FALLS: Life alert? No  Use of a cane, walker or w/c? No   TIMED UP AND GO: Was the test performed? No .   Cognitive Function:    04/19/2015    2:20 PM  MMSE - Mini Mental State Exam  Orientation to time 5  Orientation to Place 5  Registration 3  Attention/ Calculation 5  Recall 3  Language- name 2 objects 2  Language- repeat 1  Language- follow 3 step command 3  Language- read & follow direction 1  Write a sentence 1  Copy design 1  Total score 30        06/17/2022    5:03 PM 05/28/2021    3:31 PM 05/16/2020   12:13 PM 05/13/2019   11:52 AM 04/23/2018    1:34 PM  6CIT Screen  What Year? 0 points 0 points 0 points 0 points 0 points  What month? 0 points 0 points 0  points 0 points 0 points  What time? 0 points 0 points 0 points 0 points 0 points  Count back from 20 0 points 0 points 0 points 0 points 0 points  Months in reverse 0 points 0 points 0 points 0 points 0 points  Repeat phrase 0 points 0 points  0 points 0 points  Total Score 0 points 0 points  0 points 0 points    Immunizations Immunization History  Administered Date(s) Administered   Fluad Quad(high Dose 65+) 02/12/2019, 01/28/2020, 02/16/2021   Hepb-cpg 07/13/2019   Influenza, High Dose Seasonal PF 01/23/2016, 01/21/2017, 01/30/2018   Influenza,inj,Quad PF,6+ Mos 12/24/2012, 01/11/2014, 01/16/2015   Influenza-Unspecified 12/10/2011   Moderna Sars-Covid-2 Vaccination 03/23/2019, 04/24/2019, 01/10/2020, 08/23/2020   Pneumococcal Conjugate-13 02/22/2014   Pneumococcal Polysaccharide-23 07/30/2016   Td 08/17/2010   Td (Adult), 2 Lf Tetanus  Toxid, Preservative Free 08/17/2010   Covid-19 vaccine status: Completed vaccines  Screening Tests Health Maintenance  Topic Date Due   Zoster Vaccines- Shingrix (1 of 2) 07/02/2022 (Originally 05/28/1947)   COVID-19 Vaccine (5 - 2023-24 season) 07/03/2022 (Originally 11/09/2021)   INFLUENZA VACCINE  10/10/2022   MAMMOGRAM  01/18/2023   Medicare Annual Wellness (AWV)  06/17/2023   Pneumonia Vaccine 82+ Years old  Completed   DEXA SCAN  Completed   HPV VACCINES  Aged Out   DTaP/Tdap/Td  Discontinued    Health Maintenance  There are no preventive care reminders to display for this patient.   Lung Cancer Screening: (Low Dose CT Chest recommended if Age 38-80 years, 30 pack-year currently smoking OR have quit w/in 15years.) does not qualify.   Hepatitis C Screening: does not qualify.  Vision Screening: Recommended annual ophthalmology exams for early detection of glaucoma and other disorders of the eye.  Dental Screening: Recommended annual dental exams for proper oral hygiene  Community Resource Referral / Chronic Care Management: CRR required this visit?  No   CCM required this visit?  No      Plan:     I have personally reviewed and noted the following in the patient's chart:   Medical and social history Use of alcohol, tobacco or illicit drugs  Current medications and supplements including opioid prescriptions. Patient is currently taking opioid prescriptions. Information provided to patient regarding non-opioid alternatives. Patient advised to discuss non-opioid treatment plan with their provider. Functional ability and status Nutritional status Physical activity Advanced directives List of other physicians Hospitalizations, surgeries, and ER visits in previous 12 months Vitals Screenings to include cognitive, depression, and falls Referrals and appointments  In addition, I have reviewed and discussed with patient certain preventive protocols, quality metrics,  and best practice recommendations. A written personalized care plan for preventive services as well as general preventive health recommendations were provided to patient.     Cathey Endow, LPN   11/13/720

## 2022-06-17 NOTE — Patient Instructions (Addendum)
Ruth Roth , Thank you for taking time to come for your Medicare Wellness Visit. I appreciate your ongoing commitment to your health goals. Please review the following plan we discussed and let me know if I can assist you in the future.   These are the goals we discussed:  Goals       Patient Stated     Maintain Healthy Lifestyle (pt-stated)      Stay active. Healthy diet. Stay hydrated.        This is a list of the screening recommended for you and due dates:  Health Maintenance  Topic Date Due   Zoster (Shingles) Vaccine (1 of 2) 07/02/2022*   COVID-19 Vaccine (5 - 2023-24 season) 07/03/2022*   Flu Shot  10/10/2022   Mammogram  01/18/2023   Medicare Annual Wellness Visit  06/17/2023   Pneumonia Vaccine  Completed   DEXA scan (bone density measurement)  Completed   HPV Vaccine  Aged Out   DTaP/Tdap/Td vaccine  Discontinued  *Topic was postponed. The date shown is not the original due date.    Advanced directives: End of life planning; Advance aging; Advanced directives discussed.  Copy of current HCPOA/Living Will requested.    Conditions/risks identified: none new  Next appointment: Follow up in one year for your annual wellness visit    Preventive Care 65 Years and Older, Female Preventive care refers to lifestyle choices and visits with your health care provider that can promote health and wellness. What does preventive care include? A yearly physical exam. This is also called an annual well check. Dental exams once or twice a year. Routine eye exams. Ask your health care provider how often you should have your eyes checked. Personal lifestyle choices, including: Daily care of your teeth and gums. Regular physical activity. Eating a healthy diet. Avoiding tobacco and drug use. Limiting alcohol use. Practicing safe sex. Taking low-dose aspirin every day. Taking vitamin and mineral supplements as recommended by your health care provider. What happens during an  annual well check? The services and screenings done by your health care provider during your annual well check will depend on your age, overall health, lifestyle risk factors, and family history of disease. Counseling  Your health care provider may ask you questions about your: Alcohol use. Tobacco use. Drug use. Emotional well-being. Home and relationship well-being. Sexual activity. Eating habits. History of falls. Memory and ability to understand (cognition). Work and work Astronomer. Reproductive health. Screening  You may have the following tests or measurements: Height, weight, and BMI. Blood pressure. Lipid and cholesterol levels. These may be checked every 5 years, or more frequently if you are over 32 years old. Skin check. Lung cancer screening. You may have this screening every year starting at age 22 if you have a 30-pack-year history of smoking and currently smoke or have quit within the past 15 years. Fecal occult blood test (FOBT) of the stool. You may have this test every year starting at age 48. Flexible sigmoidoscopy or colonoscopy. You may have a sigmoidoscopy every 5 years or a colonoscopy every 10 years starting at age 81. Hepatitis C blood test. Hepatitis B blood test. Sexually transmitted disease (STD) testing. Diabetes screening. This is done by checking your blood sugar (glucose) after you have not eaten for a while (fasting). You may have this done every 1-3 years. Bone density scan. This is done to screen for osteoporosis. You may have this done starting at age 71. Mammogram. This may be done  every 1-2 years. Talk to your health care provider about how often you should have regular mammograms. Talk with your health care provider about your test results, treatment options, and if necessary, the need for more tests. Vaccines  Your health care provider may recommend certain vaccines, such as: Influenza vaccine. This is recommended every year. Tetanus,  diphtheria, and acellular pertussis (Tdap, Td) vaccine. You may need a Td booster every 10 years. Zoster vaccine. You may need this after age 50. Pneumococcal 13-valent conjugate (PCV13) vaccine. One dose is recommended after age 6. Pneumococcal polysaccharide (PPSV23) vaccine. One dose is recommended after age 4. Talk to your health care provider about which screenings and vaccines you need and how often you need them. This information is not intended to replace advice given to you by your health care provider. Make sure you discuss any questions you have with your health care provider. Document Released: 03/24/2015 Document Revised: 11/15/2015 Document Reviewed: 12/27/2014 Elsevier Interactive Patient Education  2017 [employer] Prevention in the Home Falls can cause injuries. They can happen to people of all ages. There are many things you can do to make your home safe and to help prevent falls. What can I do on the outside of my home? Regularly fix the edges of walkways and driveways and fix any cracks. Remove anything that might make you trip as you walk through a door, such as a raised step or threshold. Trim any bushes or trees on the path to your home. Use bright outdoor lighting. Clear any walking paths of anything that might make someone trip, such as rocks or tools. Regularly check to see if handrails are loose or broken. Make sure that both sides of any steps have handrails. Any raised decks and porches should have guardrails on the edges. Have any leaves, snow, or ice cleared regularly. Use sand or salt on walking paths during winter. Clean up any spills in your garage right away. This includes oil or grease spills. What can I do in the bathroom? Use night lights. Install grab bars by the toilet and in the tub and shower. Do not use towel bars as grab bars. Use non-skid mats or decals in the tub or shower. If you need to sit down in the shower, use a plastic,  non-slip stool. Keep the floor dry. Clean up any water that spills on the floor as soon as it happens. Remove soap buildup in the tub or shower regularly. Attach bath mats securely with double-sided non-slip rug tape. Do not have throw rugs and other things on the floor that can make you trip. What can I do in the bedroom? Use night lights. Make sure that you have a light by your bed that is easy to reach. Do not use any sheets or blankets that are too big for your bed. They should not hang down onto the floor. Have a firm chair that has side arms. You can use this for support while you get dressed. Do not have throw rugs and other things on the floor that can make you trip. What can I do in the kitchen? Clean up any spills right away. Avoid walking on wet floors. Keep items that you use a lot in easy-to-reach places. If you need to reach something above you, use a strong step stool that has a grab bar. Keep electrical cords out of the way. Do not use floor polish or wax that makes floors slippery. If you must use wax, use  non-skid floor wax. Do not have throw rugs and other things on the floor that can make you trip. What can I do with my stairs? Do not leave any items on the stairs. Make sure that there are handrails on both sides of the stairs and use them. Fix handrails that are broken or loose. Make sure that handrails are as long as the stairways. Check any carpeting to make sure that it is firmly attached to the stairs. Fix any carpet that is loose or worn. Avoid having throw rugs at the top or bottom of the stairs. If you do have throw rugs, attach them to the floor with carpet tape. Make sure that you have a light switch at the top of the stairs and the bottom of the stairs. If you do not have them, ask someone to add them for you. What else can I do to help prevent falls? Wear shoes that: Do not have high heels. Have rubber bottoms. Are comfortable and fit you well. Are closed  at the toe. Do not wear sandals. If you use a stepladder: Make sure that it is fully opened. Do not climb a closed stepladder. Make sure that both sides of the stepladder are locked into place. Ask someone to hold it for you, if possible. Clearly mark and make sure that you can see: Any grab bars or handrails. First and last steps. Where the edge of each step is. Use tools that help you move around (mobility aids) if they are needed. These include: Canes. Walkers. Scooters. Crutches. Turn on the lights when you go into a dark area. Replace any light bulbs as soon as they burn out. Set up your furniture so you have a clear path. Avoid moving your furniture around. If any of your floors are uneven, fix them. If there are any pets around you, be aware of where they are. Review your medicines with your doctor. Some medicines can make you feel dizzy. This can increase your chance of falling. Ask your doctor what other things that you can do to help prevent falls. This information is not intended to replace advice given to you by your health care provider. Make sure you discuss any questions you have with your health care provider. Document Released: 12/22/2008 Document Revised: 08/03/2015 Document Reviewed: 04/01/2014 Elsevier Interactive Patient Education  2017 [employer].

## 2022-06-17 NOTE — Telephone Encounter (Signed)
-----   Message from Dale Leonville, MD sent at 06/15/2022 10:12 PM EDT ----- Notify - inflammation marker wnl.  Hgb wnl.

## 2022-06-17 NOTE — Telephone Encounter (Signed)
LMTCB to let her know her labs were normal

## 2022-06-19 ENCOUNTER — Other Ambulatory Visit: Payer: Self-pay | Admitting: Internal Medicine

## 2022-06-20 DIAGNOSIS — N2581 Secondary hyperparathyroidism of renal origin: Secondary | ICD-10-CM | POA: Diagnosis not present

## 2022-06-20 DIAGNOSIS — I10 Essential (primary) hypertension: Secondary | ICD-10-CM | POA: Diagnosis not present

## 2022-06-20 DIAGNOSIS — R600 Localized edema: Secondary | ICD-10-CM | POA: Diagnosis not present

## 2022-06-20 DIAGNOSIS — N1832 Chronic kidney disease, stage 3b: Secondary | ICD-10-CM | POA: Diagnosis not present

## 2022-06-20 NOTE — Telephone Encounter (Signed)
Rx ok'd for tramadol. PDMP reviewed.  

## 2022-06-25 ENCOUNTER — Ambulatory Visit: Payer: Medicare Other | Admitting: Cardiovascular Disease

## 2022-07-01 ENCOUNTER — Ambulatory Visit (INDEPENDENT_AMBULATORY_CARE_PROVIDER_SITE_OTHER): Payer: Medicare Other

## 2022-07-01 DIAGNOSIS — I495 Sick sinus syndrome: Secondary | ICD-10-CM

## 2022-07-02 LAB — CUP PACEART REMOTE DEVICE CHECK
Battery Remaining Longevity: 88 mo
Battery Remaining Percentage: 93 %
Battery Voltage: 2.99 V
Brady Statistic AP VP Percent: 0 %
Brady Statistic AP VS Percent: 0 %
Brady Statistic AS VP Percent: 84 %
Brady Statistic AS VS Percent: 16 %
Brady Statistic RA Percent Paced: 0 %
Brady Statistic RV Percent Paced: 38 %
Date Time Interrogation Session: 20240422020016
Implantable Lead Connection Status: 753985
Implantable Lead Connection Status: 753985
Implantable Lead Implant Date: 20230721
Implantable Lead Implant Date: 20230721
Implantable Lead Location: 753859
Implantable Lead Location: 753860
Implantable Pulse Generator Implant Date: 20230721
Lead Channel Impedance Value: 440 Ohm
Lead Channel Impedance Value: 450 Ohm
Lead Channel Pacing Threshold Amplitude: 0.75 V
Lead Channel Pacing Threshold Pulse Width: 0.5 ms
Lead Channel Sensing Intrinsic Amplitude: 1.8 mV
Lead Channel Sensing Intrinsic Amplitude: 10.9 mV
Lead Channel Setting Pacing Amplitude: 2.5 V
Lead Channel Setting Pacing Amplitude: 3.5 V
Lead Channel Setting Pacing Pulse Width: 0.5 ms
Lead Channel Setting Sensing Sensitivity: 2 mV
Pulse Gen Model: 2272
Pulse Gen Serial Number: 8087042

## 2022-07-04 ENCOUNTER — Other Ambulatory Visit (INDEPENDENT_AMBULATORY_CARE_PROVIDER_SITE_OTHER): Payer: Medicare Other

## 2022-07-04 DIAGNOSIS — D509 Iron deficiency anemia, unspecified: Secondary | ICD-10-CM

## 2022-07-04 DIAGNOSIS — I10 Essential (primary) hypertension: Secondary | ICD-10-CM | POA: Diagnosis not present

## 2022-07-04 DIAGNOSIS — E7800 Pure hypercholesterolemia, unspecified: Secondary | ICD-10-CM

## 2022-07-04 DIAGNOSIS — K743 Primary biliary cirrhosis: Secondary | ICD-10-CM | POA: Diagnosis not present

## 2022-07-04 LAB — LIPID PANEL
Cholesterol: 142 mg/dL (ref 0–200)
HDL: 61.6 mg/dL (ref >39.00)
LDL Cholesterol: 62 mg/dL (ref 0–99)
NonHDL: 80.35
Total CHOL/HDL Ratio: 2
Triglycerides: 90 mg/dL (ref 0.0–149.0)
VLDL: 18 mg/dL (ref 0.0–40.0)

## 2022-07-04 LAB — HEPATIC FUNCTION PANEL
ALT: 18 U/L (ref 0–35)
AST: 17 U/L (ref 0–37)
Albumin: 4 g/dL (ref 3.5–5.2)
Alkaline Phosphatase: 106 U/L (ref 39–117)
Bilirubin, Direct: 0.1 mg/dL (ref 0.0–0.3)
Total Bilirubin: 0.4 mg/dL (ref 0.2–1.2)
Total Protein: 6.3 g/dL (ref 6.0–8.3)

## 2022-07-04 LAB — CBC WITH DIFFERENTIAL/PLATELET
Basophils Absolute: 0 10*3/uL (ref 0.0–0.1)
Basophils Relative: 0.8 % (ref 0.0–3.0)
Eosinophils Absolute: 0.1 10*3/uL (ref 0.0–0.7)
Eosinophils Relative: 2.8 % (ref 0.0–5.0)
HCT: 39.3 % (ref 36.0–46.0)
Hemoglobin: 12.4 g/dL (ref 12.0–15.0)
Lymphocytes Relative: 13.4 % (ref 12.0–46.0)
Lymphs Abs: 0.7 10*3/uL (ref 0.7–4.0)
MCHC: 31.6 g/dL (ref 30.0–36.0)
MCV: 101.2 fl — ABNORMAL HIGH (ref 78.0–100.0)
Monocytes Absolute: 0.5 10*3/uL (ref 0.1–1.0)
Monocytes Relative: 10.5 % (ref 3.0–12.0)
Neutro Abs: 3.5 10*3/uL (ref 1.4–7.7)
Neutrophils Relative %: 72.5 % (ref 43.0–77.0)
Platelets: 225 10*3/uL (ref 150.0–400.0)
RBC: 3.89 Mil/uL (ref 3.87–5.11)
RDW: 15.9 % — ABNORMAL HIGH (ref 11.5–15.5)
WBC: 4.9 10*3/uL (ref 4.0–10.5)

## 2022-07-04 LAB — BASIC METABOLIC PANEL
BUN: 26 mg/dL — ABNORMAL HIGH (ref 6–23)
CO2: 25 mEq/L (ref 19–32)
Calcium: 8.4 mg/dL (ref 8.4–10.5)
Chloride: 106 mEq/L (ref 96–112)
Creatinine, Ser: 1.21 mg/dL — ABNORMAL HIGH (ref 0.40–1.20)
GFR: 39.02 mL/min — ABNORMAL LOW (ref >60.00)
Glucose, Bld: 87 mg/dL (ref 70–99)
Potassium: 5.2 mEq/L — ABNORMAL HIGH (ref 3.5–5.1)
Sodium: 138 mEq/L (ref 135–145)

## 2022-07-04 LAB — IBC + FERRITIN
Ferritin: 26.4 ng/mL (ref 10.0–291.0)
Iron: 65 ug/dL (ref 42–145)
Saturation Ratios: 14.2 % — ABNORMAL LOW (ref 20.0–50.0)
TIBC: 457.8 ug/dL — ABNORMAL HIGH (ref 250.0–450.0)
Transferrin: 327 mg/dL (ref 212.0–360.0)

## 2022-07-05 ENCOUNTER — Other Ambulatory Visit: Payer: Self-pay | Admitting: *Deleted

## 2022-07-05 ENCOUNTER — Telehealth: Payer: Self-pay | Admitting: *Deleted

## 2022-07-05 DIAGNOSIS — E875 Hyperkalemia: Secondary | ICD-10-CM

## 2022-07-05 NOTE — Telephone Encounter (Signed)
Left message to return call for results (see below)  You cholesterol levels look good.  Kidney function - when compared to recent checks.  Potassium is slightly elevated.  Notify her of foods with increased potassium (see below & information mailed) - on these foods to avoid.  Would like to recheck potassium (stat) - if possible at [facility] - in the next several days.  Hgb normal. Potassium order placed for recheck at the hospital [facility])    Many of the foods that you already eat contain potassium. The foods listed below are high in potassium. If you need to boost the amount of potassium in your diet, make healthy food choices by picking items below to add to your menu.  Many fresh fruits and vegetables are rich in potassium:  Bananas, oranges, cantaloupe, honeydew, apricots, grapefruit (some dried fruits, such as prunes, raisins, and dates, are also high in potassium) Cooked spinach Cooked broccoli Potatoes Sweet potatoes Mushrooms Peas Cucumbers Zucchini Pumpkins Leafy greens Juice from potassium-rich fruit is also a good choice:  Orange juice Tomato juice Prune juice Apricot juice Grapefruit juice Certain dairy products, such as milk and yogurt, are high in potassium (low-fat or fat-free is best).  Some fish contain potassium:  [place] or legumes that are high in potassium include:  Lima beans Pinto beans Kidney beans Soybeans Lentils Other foods that are rich in potassium include:   Salt substitutes (read labels to check potassium levels) Molasses Nuts Meat and poultry Brown and wild rice Bran cereal Whole-wheat bread and pasta

## 2022-07-07 ENCOUNTER — Other Ambulatory Visit: Payer: Self-pay | Admitting: Cardiovascular Disease

## 2022-07-07 DIAGNOSIS — I4819 Other persistent atrial fibrillation: Secondary | ICD-10-CM

## 2022-07-08 ENCOUNTER — Other Ambulatory Visit: Payer: Self-pay | Admitting: Gastroenterology

## 2022-07-08 DIAGNOSIS — K743 Primary biliary cirrhosis: Secondary | ICD-10-CM

## 2022-07-08 NOTE — Telephone Encounter (Signed)
Refill request

## 2022-07-08 NOTE — Telephone Encounter (Signed)
Xarelto 15mg  refill request received. Pt is 89 years old, weight-60.3kg, Crea-1.21 on 07/04/22, last seen by Dr. Gala Romney on 06/12/22, Diagnosis-Afib/Aflutter, CrCl- 28.24 mL/min; Dose is appropriate based on dosing criteria. Will send in refill to requested pharmacy.

## 2022-07-10 DIAGNOSIS — H401132 Primary open-angle glaucoma, bilateral, moderate stage: Secondary | ICD-10-CM | POA: Diagnosis not present

## 2022-07-12 ENCOUNTER — Other Ambulatory Visit
Admission: RE | Admit: 2022-07-12 | Discharge: 2022-07-12 | Disposition: A | Discharge status: Home / Self Care | Payer: Medicare Other | Source: Ambulatory Visit | Attending: Internal Medicine | Admitting: Internal Medicine

## 2022-07-12 ENCOUNTER — Telehealth: Payer: Self-pay

## 2022-07-12 ENCOUNTER — Other Ambulatory Visit: Payer: Self-pay

## 2022-07-12 DIAGNOSIS — E875 Hyperkalemia: Secondary | ICD-10-CM

## 2022-07-12 LAB — POTASSIUM: Potassium: 5.3 mmol/L — ABNORMAL HIGH (ref 3.5–5.1)

## 2022-07-12 NOTE — Telephone Encounter (Signed)
-----   Message from Dale Scraper, MD sent at 07/05/2022  5:56 AM EDT ----- Please call and notify cholesterol levels look good.  Kidney function - when compared to recent checks.  Potassium is slightly elevated.  Notify her of foods with increased potassium (and send information) - on these foods to avoid.  Would like to recheck potassium (stat) - if possible at [facility] - in the next several days.  Hgb wnl.

## 2022-07-15 ENCOUNTER — Other Ambulatory Visit: Payer: Self-pay

## 2022-07-15 DIAGNOSIS — E875 Hyperkalemia: Secondary | ICD-10-CM

## 2022-07-15 NOTE — Telephone Encounter (Signed)
Patient states she is returning call from Rita Ohara, LPN.  I read message from Dr. Dale Brownlee [place] to patient.  Patient states she just had her potassium checked on Friday (07/12/2022).

## 2022-07-15 NOTE — Telephone Encounter (Signed)
See result note.  

## 2022-07-18 ENCOUNTER — Encounter: Payer: Self-pay | Admitting: Oncology

## 2022-07-18 ENCOUNTER — Telehealth: Payer: Self-pay

## 2022-07-18 ENCOUNTER — Encounter: Payer: Self-pay | Admitting: Nurse Practitioner

## 2022-07-18 ENCOUNTER — Ambulatory Visit (INDEPENDENT_AMBULATORY_CARE_PROVIDER_SITE_OTHER): Payer: Medicare Other | Admitting: Nurse Practitioner

## 2022-07-18 VITALS — BP 108/58 | HR 72 | Ht 62.0 in | Wt 130.0 lb

## 2022-07-18 DIAGNOSIS — R300 Dysuria: Secondary | ICD-10-CM | POA: Diagnosis not present

## 2022-07-18 LAB — POC URINALSYSI DIPSTICK (AUTOMATED)
Bilirubin, UA: NEGATIVE
Blood, UA: NEGATIVE
Glucose, UA: NEGATIVE
Ketones, UA: NEGATIVE
Nitrite, UA: NEGATIVE
Protein, UA: NEGATIVE
Spec Grav, UA: 1.015 (ref 1.010–1.025)
Urobilinogen, UA: 0.2 E.U./dL
pH, UA: 5 (ref 5.0–8.0)

## 2022-07-18 NOTE — Progress Notes (Signed)
Established Patient Office Visit  Subjective:  [name], female    DOB: April 21, 1928  Age: 89 y.o. MRN: [id number]  CC:  Chief Complaint  Patient presents with   Dysuria    Since last night, also reports chills, abd pain and some nausea; denies emesis/fever    HPI  Ruth Roth presents for:  Dysuria  This is a new problem. The current episode started yesterday. The problem occurs every urination. The quality of the pain is described as burning. Associated symptoms include chills, frequency, nausea and urgency. Pertinent negatives include no flank pain.     Past Medical History:  Diagnosis Date   (HFpEF) heart failure with preserved ejection fraction (HCC)    a. 07/2017 Echo: EF 50-55%, no rwma, mild AS/AI, mod MR, mod dil LA, mod TR, PASP ; b. 03/2021 Echo: EF 60-65%. No rwma. Nl RV fxn. Sev BAE. Mild MR. Mild AI/AS.   Autoimmune hepatitis (HCC)    followed by Dr Marva Panda   Fibrocystic breast disease    Glaucoma    Hypercholesterolemia    Hypertension    Hypothyroidism    Inflammatory arthritis    MI (myocardial infarction) (HCC)    Mitral regurgitation    a. 07/2017 Echo: Mod MR; b. 08/2017 Cath: 4+/Severe MR; c. 08/2017 TEE: Mild MR; d. 03/2021 Echo: Mild MR.   Neuropathy    Non-obstructive Coronary artery disease    a. 05/2014 NSTEMI/Cath: LM nl, LAD mild dzs, LCX 60ost hazy (? culprit), RCA mild dzs. EF 60% by echo-->Med Rx; b. 08/2017 Cath: LM 30ost, LAD min irregs, LCX small, 40ost/p, RCA large, min irregs, RPDA/RPL1 nl, EF 50-55%. 4+MR.   Osteoarthritis    Permanent atrial fibrillation (HCC)    a. CHA2DS2VASc = 6-->Xarelto.   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    Past Surgical History:  Procedure Laterality Date   ABDOMINAL HYSTERECTOMY  1963   partial, secondary to fibroids   APPENDECTOMY     Billroth     CARDIAC CATHETERIZATION  05/12/14   [facility]   CARDIOVERSION N/A 12/26/2016    Procedure: CARDIOVERSION;  Surgeon: Iran Ouch, MD;  Location: [facility] ORS;  Service: Cardiovascular;  Laterality: N/A;   CARDIOVERSION N/A 05/16/2017   Procedure: CARDIOVERSION;  Surgeon: Iran Ouch, MD;  Location: [facility] ORS;  Service: Cardiovascular;  Laterality: N/A;   CARDIOVERSION N/A 06/09/2017   Procedure: CARDIOVERSION;  Surgeon: Iran Ouch, MD;  Location: [facility] ORS;  Service: Cardiovascular;  Laterality: N/A;   CARDIOVERSION N/A 11/30/2018   Procedure: CARDIOVERSION;  Surgeon: Iran Ouch, MD;  Location: [facility] ORS;  Service: Cardiovascular;  Laterality: N/A;   CHOLECYSTECTOMY  1998   COLONOSCOPY  2009   PACEMAKER IMPLANT N/A 09/28/2021   Procedure: PACEMAKER IMPLANT;  Surgeon: Lanier Prude, MD;  Location: [facility] INVASIVE CV LAB;  Service: Cardiovascular;  Laterality: N/A;   RIGHT/LEFT HEART CATH AND CORONARY ANGIOGRAPHY Bilateral 08/21/2017   Procedure: RIGHT/LEFT HEART CATH AND CORONARY ANGIOGRAPHY;  Surgeon: Iran Ouch, MD;  Location: [facility] INVASIVE CV LAB;  Service: Cardiovascular;  Laterality: Bilateral;   TEE WITHOUT CARDIOVERSION N/A 08/25/2017   Procedure: TRANSESOPHAGEAL ECHOCARDIOGRAM (TEE);  Surgeon: Dolores Patty, MD;  Location: [facility] ENDOSCOPY;  Service: Cardiovascular;  Laterality: N/A;   UPPER GI ENDOSCOPY  2004    Family History  Problem Relation Age of Onset   Stroke Mother    Esophageal cancer Brother  also had lung cancer   Ovarian cancer Daughter    Colon cancer Daughter    Breast cancer Neg Hx     Social History   Socioeconomic History   Marital status: Widowed    Spouse name: Not on file   Number of children: 2   Years of education: Not on file   Highest education level: Not on file  Occupational History   Occupation: retired  Tobacco Use   Smoking status: Never   Smokeless tobacco: Never   Tobacco comments:    Never smoke 01/23/22  Vaping Use   Vaping Use: Never used  Substance and Sexual Activity   Alcohol  use: No    Alcohol/week: 0.0 standard drinks of alcohol   Drug use: No   Sexual activity: Never  Other Topics Concern   Not on file  Social History Narrative   Not on file   Social Determinants of Health   Financial Resource Strain: Low Risk  (06/17/2022)   Overall Financial Resource Strain (CARDIA)    Difficulty of Paying Living Expenses: Not hard at all  Food Insecurity: No Food Insecurity (06/17/2022)   Hunger Vital Sign    Worried About Running Out of Food in the Last Year: Never true    Ran Out of Food in the Last Year: Never true  Transportation Needs: No Transportation Needs (06/17/2022)   PRAPARE - Administrator, Civil Service (Medical): No    Lack of Transportation (Non-Medical): No  Physical Activity: Insufficiently Active (06/17/2022)   Exercise Vital Sign    Days of Exercise per Week: 3 days    Minutes of Exercise per Session: 40 min  Stress: No Stress Concern Present (06/17/2022)   [employer] of Occupational Health - Occupational Stress Questionnaire    Feeling of Stress : Not at all  Social Connections: Unknown (06/17/2022)   Social Connection and Isolation Panel [NHANES]    Frequency of Communication with Friends and Family: More than three times a week    Frequency of Social Gatherings with Friends and Family: More than three times a week    Attends Religious Services: Not on file    Active Member of Clubs or Organizations: Not on file    Attends Banker Meetings: Not on file    Marital Status: Not on file  Intimate Partner Violence: Not At Risk (06/17/2022)   Humiliation, Afraid, Rape, and Kick questionnaire    Fear of Current or Ex-Partner: No    Emotionally Abused: No    Physically Abused: No    Sexually Abused: No     Outpatient Medications Prior to Visit  Medication Sig Dispense Refill   allopurinol (ZYLOPRIM) 100 MG tablet TAKE 1/2 TABLET(50 MG) BY MOUTH DAILY 45 tablet 1   diltiazem (CARDIZEM CD) 240 MG 24 hr capsule Take 240  mg by mouth daily.     dorzolamide (TRUSOPT) 2 % ophthalmic solution Place 1 drop into the left eye 2 (two) times daily.     fluticasone (FLONASE) 50 MCG/ACT nasal spray Place 2 sprays into both nostrils daily as needed for allergies.     gabapentin (NEURONTIN) 300 MG capsule One capsule tid and two capsules q hs. 150 capsule 2   hydroxychloroquine (PLAQUENIL) 200 MG tablet Take 200 mg by mouth every other day.     ipratropium (ATROVENT) 0.03 % nasal spray Place 2 sprays into both nostrils 3 (three) times daily as needed for rhinitis.     latanoprost (XALATAN) 0.005 %  ophthalmic solution Place 1 drop into both eyes at bedtime.      levothyroxine (SYNTHROID) 125 MCG tablet Take 1 tablet (125 mcg total) by mouth daily. 90 tablet 3   metoprolol tartrate (LOPRESSOR) 100 MG tablet TAKE 1 AND 1/2 TABLETS(150 MG) BY MOUTH TWICE DAILY 270 tablet 1   mometasone (ELOCON) 0.1 % cream Apply 1 Application topically daily. Appear pea sized ( or less) amount to external ear, and slightly in canal. 15 g 0   Multiple Vitamin (MULTI-VITAMIN) tablet Take 1 tablet by mouth at bedtime.      Probiotic Product (ALIGN) 4 MG CAPS Take one capsule q day 30 capsule 0   traMADol (ULTRAM) 50 MG tablet TAKE 1 TABLET(50 MG) BY MOUTH TWICE DAILY AS NEEDED FOR PAIN 60 tablet 1   ursodiol (ACTIGALL) 300 MG capsule Take 300-600 mg by mouth See admin instructions. Take 2 capsules (600 mg) daily in the morning   & 1 capsule (300 mg) at night.     vitamin B-12 (CYANOCOBALAMIN) 500 MCG tablet Take 500 mcg by mouth every Monday.     XARELTO 15 MG TABS tablet TAKE 1 TABLET(15 MG) BY MOUTH DAILY WITH SUPPER 90 tablet 1   torsemide (DEMADEX) 20 MG tablet Take 1 tablet (20 mg total) by mouth every other day. 45 tablet 3   NEOMYCIN-POLYMYXIN-HYDROCORTISONE (CORTISPORIN) 1 % SOLN OTIC solution Place 3 drops into the left ear 4 (four) times daily. 10 mL 0   No facility-administered medications prior to visit.    Allergies  Allergen  Reactions   Statins Other (See Comments)    Liver damage    Hydroxychloroquine Hives   Haldol [Haloperidol Lactate] Rash   Librax [Chlordiazepoxide-Clidinium] Rash   Plavix [Clopidogrel Bisulfate] Rash   Ramipril Rash    ROS Review of Systems  Constitutional:  Positive for chills.  Gastrointestinal:  Positive for abdominal pain and nausea.  Genitourinary:  Positive for dysuria, frequency and urgency. Negative for flank pain.      Objective:    Physical Exam Constitutional:      Appearance: Normal appearance. She is obese.  Eyes:     Pupils: Pupils are equal, round, and reactive to light.  Cardiovascular:     Rate and Rhythm: Normal rate and regular rhythm.     Pulses: Normal pulses.     Heart sounds: Normal heart sounds.  Pulmonary:     Effort: Pulmonary effort is normal. No respiratory distress.     Breath sounds: Normal breath sounds. No rhonchi.  Abdominal:     General: Bowel sounds are normal.     Palpations: Abdomen is soft. There is no mass.     Tenderness: There is abdominal tenderness in the suprapubic area. There is no right CVA tenderness or left CVA tenderness.     Hernia: No hernia is present.  Musculoskeletal:     Cervical back: Neck supple. No tenderness.  Skin:    Capillary Refill: Capillary refill takes less than 2 seconds.  Neurological:     General: No focal deficit present.     Mental Status: She is alert and oriented to person, place, and time. Mental status is at baseline.  Psychiatric:        Mood and Affect: Mood normal.        Behavior: Behavior normal.        Thought Content: Thought content normal.        Judgment: Judgment normal.     BP (!) 108/58  Pulse 72   Ht 5\' 2"  (1.575 m)   Wt 130 lb (59 kg)   SpO2 94%   BMI 23.78 kg/m  Wt Readings from Last 3 Encounters:  07/18/22 130 lb (59 kg)  06/17/22 133 lb (60.3 kg)  06/13/22 133 lb 12.8 oz (60.7 kg)     Health Maintenance  Topic Date Due   Zoster Vaccines- Shingrix (1 of  2) Never done   COVID-19 Vaccine (5 - 2023-24 season) 11/09/2021   INFLUENZA VACCINE  10/10/2022   MAMMOGRAM  01/18/2023   Medicare Annual Wellness (AWV)  06/17/2023   Pneumonia Vaccine 48+ Years old  Completed   DEXA SCAN  Completed   HPV VACCINES  Aged Out   DTaP/Tdap/Td  Discontinued    There are no preventive care reminders to display for this patient.  Lab Results  Component Value Date   TSH 3.41 04/02/2022   Lab Results  Component Value Date   WBC 4.9 07/04/2022   HGB 12.4 07/04/2022   HCT 39.3 07/04/2022   MCV 101.2 (H) 07/04/2022   PLT 225.0 07/04/2022   Lab Results  Component Value Date   NA 138 07/04/2022   K 5.3 (H) 07/12/2022   CO2 25 07/04/2022   GLUCOSE 87 07/04/2022   BUN 26 (H) 07/04/2022   CREATININE 1.21 (H) 07/04/2022   BILITOT 0.4 07/04/2022   ALKPHOS 106 07/04/2022   AST 17 07/04/2022   ALT 18 07/04/2022   PROT 6.3 07/04/2022   ALBUMIN 4.0 07/04/2022   CALCIUM 8.4 07/04/2022   ANIONGAP 11 04/04/2022   EGFR 47 (L) 10/26/2021   GFR 39.02 (L) 07/04/2022   Lab Results  Component Value Date   CHOL 142 07/04/2022   Lab Results  Component Value Date   HDL 61.60 07/04/2022   Lab Results  Component Value Date   LDLCALC 62 07/04/2022   Lab Results  Component Value Date   TRIG 90.0 07/04/2022   Lab Results  Component Value Date   CHOLHDL 2 07/04/2022   Lab Results  Component Value Date   HGBA1C 5.6 01/27/2018      Assessment & Plan:  Dysuria Assessment & Plan: POCT urinalysis positive for  leukocytes and negative for nitrite. Will send urine for culture and treat according to the culture result. Advised patient to increase fluid intake. Advised to take over-the-counter Azo for pain.   Orders: -     POCT Urinalysis Dipstick (Automated) -     Urine Culture  Other orders -     Amoxicillin-Pot Clavulanate; Take 1 tablet by mouth 2 (two) times daily for 7 days.  Dispense: 14 tablet; Refill: 0    Follow-up: Return if symptoms  worsen or fail to improve.   Kara Dies, NP

## 2022-07-20 DIAGNOSIS — R300 Dysuria: Secondary | ICD-10-CM | POA: Insufficient documentation

## 2022-07-20 LAB — URINE CULTURE
MICRO NUMBER:: 14935443
SPECIMEN QUALITY:: ADEQUATE

## 2022-07-20 MED ORDER — AMOXICILLIN-POT CLAVULANATE 875-125 MG PO TABS: 1.0000 | ORAL_TABLET | Freq: Two times a day (BID) | ORAL | 0 refills | Status: AC

## 2022-07-20 NOTE — Assessment & Plan Note (Signed)
POCT urinalysis positive for  leukocytes and negative for nitrite. Will send urine for culture and treat according to the culture result. Advised patient to increase fluid intake. Advised to take over-the-counter Azo for pain.

## 2022-07-20 NOTE — Progress Notes (Signed)
Please call patient and let her know that the urine is positive for E. coli. Augmentin twice a day for 7 days has been sent to the pharmacy.

## 2022-07-23 ENCOUNTER — Telehealth: Payer: Self-pay | Admitting: Internal Medicine

## 2022-07-23 NOTE — Telephone Encounter (Signed)
Patient returned office phone call and note from Konrad Dolores was read. Patient will go pick up her medication.(Lab result note for urine)

## 2022-07-25 ENCOUNTER — Ambulatory Visit
Admission: RE | Admit: 2022-07-25 | Discharge: 2022-07-25 | Disposition: A | Discharge status: Home / Self Care | Payer: Medicare Other | Source: Ambulatory Visit | Attending: Gastroenterology | Admitting: Gastroenterology

## 2022-07-25 DIAGNOSIS — K743 Primary biliary cirrhosis: Secondary | ICD-10-CM

## 2022-07-29 ENCOUNTER — Telehealth: Payer: Self-pay

## 2022-07-29 ENCOUNTER — Other Ambulatory Visit
Admission: RE | Admit: 2022-07-29 | Discharge: 2022-07-29 | Disposition: A | Discharge status: Home / Self Care | Payer: Medicare Other | Source: Ambulatory Visit | Attending: Internal Medicine | Admitting: Internal Medicine

## 2022-07-29 DIAGNOSIS — E875 Hyperkalemia: Secondary | ICD-10-CM

## 2022-07-29 LAB — POTASSIUM: Potassium: 5.9 mmol/L — ABNORMAL HIGH (ref 3.5–5.1)

## 2022-07-29 MED ORDER — VELTASSA 8.4 G PO PACK: 8.4000 g | PACK | Freq: Every day | ORAL | 0 refills | Status: DC

## 2022-07-29 NOTE — Telephone Encounter (Signed)
Spoke with pt and informed her of her potassium level and medication that she needs to start to help lower the potassium. Pt is also aware that we will need to go over to [facility] medical mall to have her potassium redrawn on either Wednesday or Thursday. Pt gave a verbal understanding and stated that she will go pick up the medication now.

## 2022-07-29 NOTE — Telephone Encounter (Signed)
Attempted to call pt, no answer, left voicemail to call office back.  Called son MESHAWN HUIE, on [place], he reported that prescription should be sent to [facility] in [place] but he knew the pt would not pick it up today since she lives 25 miles from pharmacy. He will try to call his mother to have her call us back.  Potassium 5.9.  Pt needs to have potassium rechecked STAT at [facility] or Thu.    See note below from Dr. Lorin Picket: Notify - potassium elevated.  Needs to avoid foods with increased potassium.  Inform her and send information on foods with increased potassium.  Also, given potassium has increased, I would like to start her on veltassa 8.4 grams q day.  Needs a f/u with Dr Cherylann Ratel.  She sees him for her kidneys.  He is aware and agreeable to see her.  Just need to make appt.  Recheck potassium stat this week.  See if can go to [facility] for potassium recheck.

## 2022-07-29 NOTE — Addendum Note (Signed)
Addended by: Sandy Salaam on: 07/29/2022 04:36 PM   Modules accepted: Orders

## 2022-08-02 ENCOUNTER — Other Ambulatory Visit
Admission: RE | Admit: 2022-08-02 | Discharge: 2022-08-02 | Disposition: A | Discharge status: Home / Self Care | Payer: Medicare Other | Attending: Internal Medicine | Admitting: Internal Medicine

## 2022-08-02 DIAGNOSIS — E875 Hyperkalemia: Secondary | ICD-10-CM | POA: Diagnosis not present

## 2022-08-02 LAB — POTASSIUM: Potassium: 4.3 mmol/L (ref 3.5–5.1)

## 2022-08-06 ENCOUNTER — Telehealth: Payer: Self-pay

## 2022-08-06 NOTE — Telephone Encounter (Signed)
-----   Message from Dale Firestone, MD sent at 08/03/2022 10:19 AM EDT ----- Notify - potassium level is wnl.  Continue current medication as she is doing.  Need to confirm she has a soon f/u with Dr Cherylann Ratel - (nephrology).  If no appt, need to schedule.

## 2022-08-07 DIAGNOSIS — W010XXA Fall on same level from slipping, tripping and stumbling without subsequent striking against object, initial encounter: Secondary | ICD-10-CM | POA: Diagnosis not present

## 2022-08-07 DIAGNOSIS — Z8739 Personal history of other diseases of the musculoskeletal system and connective tissue: Secondary | ICD-10-CM | POA: Diagnosis not present

## 2022-08-07 DIAGNOSIS — M25562 Pain in left knee: Secondary | ICD-10-CM | POA: Diagnosis not present

## 2022-08-07 DIAGNOSIS — M159 Polyosteoarthritis, unspecified: Secondary | ICD-10-CM | POA: Diagnosis not present

## 2022-08-07 DIAGNOSIS — Y92009 Unspecified place in unspecified non-institutional (private) residence as the place of occurrence of the external cause: Secondary | ICD-10-CM | POA: Diagnosis not present

## 2022-08-07 DIAGNOSIS — M4726 Other spondylosis with radiculopathy, lumbar region: Secondary | ICD-10-CM | POA: Diagnosis not present

## 2022-08-07 NOTE — Progress Notes (Signed)
Remote pacemaker transmission.   

## 2022-08-07 NOTE — Telephone Encounter (Signed)
Pt returned [name] LPN call. Note below was read to pt. Pt aware and understood. As per pt, she has a f/u with Dr. Cherylann Ratel in August.

## 2022-08-08 NOTE — Telephone Encounter (Signed)
Noted  

## 2022-08-13 ENCOUNTER — Encounter: Payer: Self-pay | Admitting: Internal Medicine

## 2022-08-13 ENCOUNTER — Ambulatory Visit (INDEPENDENT_AMBULATORY_CARE_PROVIDER_SITE_OTHER): Payer: Medicare Other | Admitting: Internal Medicine

## 2022-08-13 VITALS — BP 128/76 | HR 106 | Temp 98.0°F | Resp 16 | Ht 62.0 in | Wt 134.4 lb

## 2022-08-13 DIAGNOSIS — N1832 Chronic kidney disease, stage 3b: Secondary | ICD-10-CM

## 2022-08-13 DIAGNOSIS — I272 Pulmonary hypertension, unspecified: Secondary | ICD-10-CM

## 2022-08-13 DIAGNOSIS — K754 Autoimmune hepatitis: Secondary | ICD-10-CM

## 2022-08-13 DIAGNOSIS — K7460 Unspecified cirrhosis of liver: Secondary | ICD-10-CM

## 2022-08-13 DIAGNOSIS — I5032 Chronic diastolic (congestive) heart failure: Secondary | ICD-10-CM | POA: Diagnosis not present

## 2022-08-13 DIAGNOSIS — I25118 Atherosclerotic heart disease of native coronary artery with other forms of angina pectoris: Secondary | ICD-10-CM | POA: Diagnosis not present

## 2022-08-13 DIAGNOSIS — E7800 Pure hypercholesterolemia, unspecified: Secondary | ICD-10-CM

## 2022-08-13 DIAGNOSIS — I10 Essential (primary) hypertension: Secondary | ICD-10-CM | POA: Diagnosis not present

## 2022-08-13 DIAGNOSIS — D329 Benign neoplasm of meninges, unspecified: Secondary | ICD-10-CM

## 2022-08-13 DIAGNOSIS — K219 Gastro-esophageal reflux disease without esophagitis: Secondary | ICD-10-CM | POA: Diagnosis not present

## 2022-08-13 DIAGNOSIS — G6289 Other specified polyneuropathies: Secondary | ICD-10-CM

## 2022-08-13 DIAGNOSIS — E039 Hypothyroidism, unspecified: Secondary | ICD-10-CM | POA: Diagnosis not present

## 2022-08-13 DIAGNOSIS — M810 Age-related osteoporosis without current pathological fracture: Secondary | ICD-10-CM

## 2022-08-13 DIAGNOSIS — I4819 Other persistent atrial fibrillation: Secondary | ICD-10-CM

## 2022-08-13 NOTE — Progress Notes (Signed)
Subjective:    [name], female    DOB: 1929/02/18, 89 y.o.   MRN: [id number]  Patient here for  Chief Complaint  Patient presents with   Medical Management of Chronic Issues    HPI Here to follow up regarding CAD, chronic diastolic heart failure and hypertension.  S/p pacemaker placement in July.  Saw cardiology 06/12/22. No changes made. No chest pain reported.  Breathing stable. Saw nephrology 06/20/22.  GFR stable - 40.  Recommended starting calcitriol.  F/u regarding PBC cirrhosis/CTP A - stable on ursodiol.  F/u abdominal ultrasound - Subtle nodularity along the free edge of the liver is stable,  consistent with the history of cirrhosis. No focal hepatic lesions  are present. Mild increased echogenicity of the liver, stable.  Saw rheumatology 08/07/22 - s/p fall three to four weeks ago. No LOC.  No head injury.  Landed on her right side.  Aggravated her left knee trying to get up.  Recommended PT.  Prolia - Dr Tedd Sias.    Past Medical History:  Diagnosis Date   (HFpEF) heart failure with preserved ejection fraction (HCC)    a. 07/2017 Echo: EF 50-55%, no rwma, mild AS/AI, mod MR, mod dil LA, mod TR, PASP ; b. 03/2021 Echo: EF 60-65%. No rwma. Nl RV fxn. Sev BAE. Mild MR. Mild AI/AS.   Autoimmune hepatitis (HCC)    followed by Dr Marva Panda   Fibrocystic breast disease    Glaucoma    Hypercholesterolemia    Hypertension    Hypothyroidism    Inflammatory arthritis    MI (myocardial infarction) (HCC)    Mitral regurgitation    a. 07/2017 Echo: Mod MR; b. 08/2017 Cath: 4+/Severe MR; c. 08/2017 TEE: Mild MR; d. 03/2021 Echo: Mild MR.   Neuropathy    Non-obstructive Coronary artery disease    a. 05/2014 NSTEMI/Cath: LM nl, LAD mild dzs, LCX 60ost hazy (? culprit), RCA mild dzs. EF 60% by echo-->Med Rx; b. 08/2017 Cath: LM 30ost, LAD min irregs, LCX small, 40ost/p, RCA large, min irregs, RPDA/RPL1 nl, EF 50-55%. 4+MR.   Osteoarthritis    Permanent atrial fibrillation (HCC)     a. CHA2DS2VASc = 6-->Xarelto.   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   Past Surgical History:  Procedure Laterality Date   ABDOMINAL HYSTERECTOMY  1963   partial, secondary to fibroids   APPENDECTOMY     Billroth     CARDIAC CATHETERIZATION  05/12/14   [facility]   CARDIOVERSION N/A 12/26/2016   Procedure: CARDIOVERSION;  Surgeon: Iran Ouch, MD;  Location: [facility] ORS;  Service: Cardiovascular;  Laterality: N/A;   CARDIOVERSION N/A 05/16/2017   Procedure: CARDIOVERSION;  Surgeon: Iran Ouch, MD;  Location: [facility] ORS;  Service: Cardiovascular;  Laterality: N/A;   CARDIOVERSION N/A 06/09/2017   Procedure: CARDIOVERSION;  Surgeon: Iran Ouch, MD;  Location: [facility] ORS;  Service: Cardiovascular;  Laterality: N/A;   CARDIOVERSION N/A 11/30/2018   Procedure: CARDIOVERSION;  Surgeon: Iran Ouch, MD;  Location: [facility] ORS;  Service: Cardiovascular;  Laterality: N/A;   CHOLECYSTECTOMY  1998   COLONOSCOPY  2009   PACEMAKER IMPLANT N/A 09/28/2021   Procedure: PACEMAKER IMPLANT;  Surgeon: Lanier Prude, MD;  Location: [facility] INVASIVE CV LAB;  Service: Cardiovascular;  Laterality: N/A;   RIGHT/LEFT HEART CATH AND CORONARY ANGIOGRAPHY Bilateral 08/21/2017   Procedure: RIGHT/LEFT HEART CATH AND CORONARY ANGIOGRAPHY;  Surgeon: Iran Ouch, MD;  Location:  [facility] INVASIVE CV LAB;  Service: Cardiovascular;  Laterality: Bilateral;   TEE WITHOUT CARDIOVERSION N/A 08/25/2017   Procedure: TRANSESOPHAGEAL ECHOCARDIOGRAM (TEE);  Surgeon: Dolores Patty, MD;  Location: [facility] ENDOSCOPY;  Service: Cardiovascular;  Laterality: N/A;   UPPER GI ENDOSCOPY  2004   Family History  Problem Relation Age of Onset   Stroke Mother    Esophageal cancer Brother        also had lung cancer   Ovarian cancer Daughter    Colon cancer Daughter    Breast cancer Neg Hx    Social History   Socioeconomic History   Marital status: Widowed    Spouse name: Not  on file   Number of children: 2   Years of education: Not on file   Highest education level: Not on file  Occupational History   Occupation: retired  Tobacco Use   Smoking status: Never   Smokeless tobacco: Never   Tobacco comments:    Never smoke 01/23/22  Vaping Use   Vaping Use: Never used  Substance and Sexual Activity   Alcohol use: No    Alcohol/week: 0.0 standard drinks of alcohol   Drug use: No   Sexual activity: Never  Other Topics Concern   Not on file  Social History Narrative   Not on file   Social Determinants of Health   Financial Resource Strain: Low Risk  (06/17/2022)   Overall Financial Resource Strain (CARDIA)    Difficulty of Paying Living Expenses: Not hard at all  Food Insecurity: No Food Insecurity (06/17/2022)   Hunger Vital Sign    Worried About Running Out of Food in the Last Year: Never true    Ran Out of Food in the Last Year: Never true  Transportation Needs: No Transportation Needs (06/17/2022)   PRAPARE - Administrator, Civil Service (Medical): No    Lack of Transportation (Non-Medical): No  Physical Activity: Insufficiently Active (06/17/2022)   Exercise Vital Sign    Days of Exercise per Week: 3 days    Minutes of Exercise per Session: 40 min  Stress: No Stress Concern Present (06/17/2022)   [employer] of Occupational Health - Occupational Stress Questionnaire    Feeling of Stress : Not at all  Social Connections: Unknown (06/17/2022)   Social Connection and Isolation Panel [NHANES]    Frequency of Communication with Friends and Family: More than three times a week    Frequency of Social Gatherings with Friends and Family: More than three times a week    Attends Religious Services: Not on Marketing executive or Organizations: Not on file    Attends Banker Meetings: Not on file    Marital Status: Not on file     Review of Systems  Constitutional:  Negative for appetite change and unexpected weight  change.  HENT:  Negative for congestion and sinus pressure.   Respiratory:  Negative for cough and chest tightness.        Breathing overall stable.   Cardiovascular:  Negative for chest pain and palpitations.       No increased swelling.   Gastrointestinal:  Negative for abdominal pain, diarrhea, nausea and vomiting.  Genitourinary:  Negative for difficulty urinating and dysuria.  Musculoskeletal:  Negative for joint swelling and myalgias.  Skin:  Negative for color change and rash.  Neurological:  Negative for dizziness and headaches.  Psychiatric/Behavioral:  Negative for agitation and dysphoric mood.  Objective:     BP 128/76   Pulse (!) 106   Temp 98 F (36.7 C)   Resp 16   Ht 5\' 2"  (1.575 m)   Wt 134 lb 6.4 oz (61 kg)   SpO2 96%   BMI 24.58 kg/m  Wt Readings from Last 3 Encounters:  08/13/22 134 lb 6.4 oz (61 kg)  07/18/22 130 lb (59 kg)  06/17/22 133 lb (60.3 kg)    Physical Exam Vitals reviewed.  Constitutional:      General: She is not in acute distress.    Appearance: Normal appearance.  HENT:     Head: Normocephalic and atraumatic.     Right Ear: External ear normal.     Left Ear: External ear normal.  Eyes:     General: No scleral icterus.       Right eye: No discharge.        Left eye: No discharge.     Conjunctiva/sclera: Conjunctivae normal.  Neck:     Thyroid: No thyromegaly.  Cardiovascular:     Rate and Rhythm: Normal rate.     Comments: Rate - approximately 100.  Pulmonary:     Effort: No respiratory distress.     Breath sounds: Normal breath sounds. No wheezing.  Abdominal:     General: Bowel sounds are normal.     Palpations: Abdomen is soft.     Tenderness: There is no abdominal tenderness.  Musculoskeletal:        General: No swelling or tenderness.     Cervical back: Neck supple. No tenderness.  Lymphadenopathy:     Cervical: No cervical adenopathy.  Skin:    Findings: No erythema or rash.  Neurological:     Mental  Status: She is alert.  Psychiatric:        Mood and Affect: Mood normal.        Behavior: Behavior normal.      Outpatient Encounter Medications as of 08/13/2022  Medication Sig   allopurinol (ZYLOPRIM) 100 MG tablet TAKE 1/2 TABLET(50 MG) BY MOUTH DAILY   diltiazem (CARDIZEM CD) 240 MG 24 hr capsule Take 240 mg by mouth daily.   dorzolamide (TRUSOPT) 2 % ophthalmic solution Place 1 drop into the left eye 2 (two) times daily.   fluticasone (FLONASE) 50 MCG/ACT nasal spray Place 2 sprays into both nostrils daily as needed for allergies.   gabapentin (NEURONTIN) 300 MG capsule One capsule tid and two capsules q hs.   hydroxychloroquine (PLAQUENIL) 200 MG tablet Take 200 mg by mouth every other day.   ipratropium (ATROVENT) 0.03 % nasal spray Place 2 sprays into both nostrils 3 (three) times daily as needed for rhinitis.   latanoprost (XALATAN) 0.005 % ophthalmic solution Place 1 drop into both eyes at bedtime.    levothyroxine (SYNTHROID) 125 MCG tablet Take 1 tablet (125 mcg total) by mouth daily.   metoprolol tartrate (LOPRESSOR) 100 MG tablet TAKE 1 AND 1/2 TABLETS(150 MG) BY MOUTH TWICE DAILY   mometasone (ELOCON) 0.1 % cream Apply 1 Application topically daily. Appear pea sized ( or less) amount to external ear, and slightly in canal.   Multiple Vitamin (MULTI-VITAMIN) tablet Take 1 tablet by mouth at bedtime.    patiromer (VELTASSA) 8.4 g packet Take 1 packet (8.4 g total) by mouth daily.   Probiotic Product (ALIGN) 4 MG CAPS Take one capsule q day   torsemide (DEMADEX) 20 MG tablet Take 1 tablet (20 mg total) by mouth every other day.  traMADol (ULTRAM) 50 MG tablet TAKE 1 TABLET(50 MG) BY MOUTH TWICE DAILY AS NEEDED FOR PAIN   ursodiol (ACTIGALL) 300 MG capsule Take 300-600 mg by mouth See admin instructions. Take 2 capsules (600 mg) daily in the morning   & 1 capsule (300 mg) at night.   vitamin B-12 (CYANOCOBALAMIN) 500 MCG tablet Take 500 mcg by mouth every Monday.   XARELTO 15 MG  TABS tablet TAKE 1 TABLET(15 MG) BY MOUTH DAILY WITH SUPPER   No facility-administered encounter medications on file as of 08/13/2022.     Lab Results  Component Value Date   WBC 4.9 07/04/2022   HGB 12.4 07/04/2022   HCT 39.3 07/04/2022   PLT 225.0 07/04/2022   GLUCOSE 87 07/04/2022   CHOL 142 07/04/2022   TRIG 90.0 07/04/2022   HDL 61.60 07/04/2022   LDLDIRECT 143.5 12/17/2012   LDLCALC 62 07/04/2022   ALT 18 07/04/2022   AST 17 07/04/2022   NA 138 07/04/2022   K 4.3 08/02/2022   CL 106 07/04/2022   CREATININE 1.21 (H) 07/04/2022   BUN 26 (H) 07/04/2022   CO2 25 07/04/2022   TSH 3.41 04/02/2022   INR 1.63 12/29/2017   HGBA1C 5.6 01/27/2018    No results found.     Assessment & Plan:  Atherosclerotic heart disease of native coronary artery with other forms of angina pectoris [facility]) Assessment & Plan: Continue risk factor modification.  Continue metoprolol and xarelto. Overall stable.    Autoimmune hepatitis (HCC) Assessment & Plan: Follow liver function tests.  Followed by GI -  stable on ursodiol.  F/u abdominal ultrasound - Subtle nodularity along the free edge of the liver is stable,  consistent with the history of cirrhosis. No focal hepatic lesions  are present. Mild increased echogenicity of the liver, stable.   Benign hypertension Assessment & Plan: On metoprolol and diltiazem.  Follow pressures. Hold on making any changes.    Benign meningioma [facility]) Assessment & Plan: Evaluated by Dr Dwyane Luo (oncology [facility]).  States was told no further w/up or f/u neded.  Of note, no mention on MRI 2018.    Chronic diastolic heart failure (HCC) Assessment & Plan: Continue to weigh daily.  Breathing overall stable.  Has torsemide.    Cirrhosis of liver without ascites, unspecified hepatic cirrhosis type (HCC) Assessment & Plan: Stable on ursodiol.  F/u abdominal ultrasound - Subtle nodularity along the free edge of the liver is stable,  consistent with the  history of cirrhosis. No focal hepatic lesions  are present. Mild increased echogenicity of the liver, stable.    Stage 3b chronic kidney disease (HCC) Assessment & Plan:  Avoid antiinflammatories.  Follow metabolic panel. Followed by nephrology.  Recently had issues with increased potassium.  Rx sent in for veltassa.  Not sure if she took the medication.  Potassium improved.  Her and her friend who accompanies her will check medication when they get home and call back with medication currently taking.    Gastroesophageal reflux disease, unspecified whether esophagitis present Assessment & Plan: Upper symptoms controlled.  On protonix.    Hypercholesteremia Assessment & Plan: Unable to take statin medication.  Follow lipid panel.    Hypothyroidism, unspecified type Assessment & Plan: On thyroid replacement.  Follow tsh.     Osteoporosis without current pathological fracture, unspecified osteoporosis type Assessment & Plan: Has been seeing Dr Tedd Sias.  Prolia.     Other polyneuropathy Assessment & Plan: On gabapentin.    Persistent atrial fibrillation (  [facility]) Assessment & Plan:  Saw cardiology 06/12/22 - reported occasional palpitations and will notice heart rate is up at times on blood pressure cuff.  No chest pain or sob.  Recommended continuing current medication regimen.   Pulmonary hypertension, unspecified (HCC) Assessment & Plan: Followed by cardiology.       Dale [place], MD

## 2022-08-14 ENCOUNTER — Telehealth: Payer: Self-pay

## 2022-08-14 NOTE — Telephone Encounter (Signed)
Patient states she left her ring here in one of our bathrooms.  I let patient know that we have found the ring.  Patient states she cannot come today, but she is planning to come by this week to pick it up, maybe Friday (08/16/2022).    Dr. Dana Allan had the ring locked in her cabinet for safe keeping and she has given it to Henrene Pastor, RN, Research officer, political party.

## 2022-08-18 ENCOUNTER — Encounter: Payer: Self-pay | Admitting: Internal Medicine

## 2022-08-18 NOTE — Assessment & Plan Note (Signed)
Has been seeing Dr Solum.  Prolia.   

## 2022-08-18 NOTE — Assessment & Plan Note (Signed)
On thyroid replacement.  Follow tsh.  

## 2022-08-18 NOTE — Assessment & Plan Note (Signed)
Evaluated by Dr Allen Friedman (oncology [facility]).  States was told no further w/up or f/u neded.  Of note, no mention on MRI 2018.  

## 2022-08-18 NOTE — Assessment & Plan Note (Signed)
Follow liver function tests.  Followed by GI -  stable on ursodiol.  F/u abdominal ultrasound - Subtle nodularity along the free edge of the liver is stable,  consistent with the history of cirrhosis. No focal hepatic lesions  are present. Mild increased echogenicity of the liver, stable.

## 2022-08-18 NOTE — Assessment & Plan Note (Signed)
Unable to take statin medication.  Follow lipid panel.  

## 2022-08-18 NOTE — Assessment & Plan Note (Signed)
Followed by cardiology 

## 2022-08-18 NOTE — Assessment & Plan Note (Signed)
Saw cardiology 06/12/22 - reported occasional palpitations and will notice heart rate is up at times on blood pressure cuff.  No chest pain or sob.  Recommended continuing current medication regimen. 

## 2022-08-18 NOTE — Assessment & Plan Note (Addendum)
Continue to weigh daily.  Breathing overall stable.  Has torsemide.

## 2022-08-18 NOTE — Assessment & Plan Note (Signed)
On metoprolol and diltiazem.  Follow pressures. Hold on making any changes.  

## 2022-08-18 NOTE — Assessment & Plan Note (Signed)
Stable on ursodiol.  F/u abdominal ultrasound - Subtle nodularity along the free edge of the liver is stable,  consistent with the history of cirrhosis. No focal hepatic lesions  are present. Mild increased echogenicity of the liver, stable.

## 2022-08-18 NOTE — Assessment & Plan Note (Signed)
Upper symptoms controlled.  On protonix.  

## 2022-08-18 NOTE — Assessment & Plan Note (Signed)
On gabapentin.  

## 2022-08-18 NOTE — Assessment & Plan Note (Signed)
Continue risk factor modification.  Continue metoprolol and xarelto. Overall stable.

## 2022-08-18 NOTE — Assessment & Plan Note (Addendum)
Avoid antiinflammatories.  Follow metabolic panel. Followed by nephrology.  Recently had issues with increased potassium.  Rx sent in for veltassa.  Not sure if she took the medication.  Potassium improved.  Her and her friend who accompanies her will check medication when they get home and call back with medication currently taking.

## 2022-08-28 DIAGNOSIS — G8929 Other chronic pain: Secondary | ICD-10-CM | POA: Diagnosis not present

## 2022-08-28 DIAGNOSIS — M25562 Pain in left knee: Secondary | ICD-10-CM | POA: Diagnosis not present

## 2022-09-03 ENCOUNTER — Ambulatory Visit: Payer: Medicare Other | Attending: Cardiovascular Disease | Admitting: Cardiovascular Disease

## 2022-09-03 ENCOUNTER — Encounter: Payer: Self-pay | Admitting: Cardiovascular Disease

## 2022-09-03 VITALS — BP 120/62 | HR 112 | Ht 62.0 in | Wt 133.8 lb

## 2022-09-03 DIAGNOSIS — I5032 Chronic diastolic (congestive) heart failure: Secondary | ICD-10-CM

## 2022-09-03 DIAGNOSIS — I4821 Permanent atrial fibrillation: Secondary | ICD-10-CM | POA: Diagnosis not present

## 2022-09-03 DIAGNOSIS — I10 Essential (primary) hypertension: Secondary | ICD-10-CM

## 2022-09-03 DIAGNOSIS — I2510 Atherosclerotic heart disease of native coronary artery without angina pectoris: Secondary | ICD-10-CM

## 2022-09-03 DIAGNOSIS — I4892 Unspecified atrial flutter: Secondary | ICD-10-CM | POA: Diagnosis not present

## 2022-09-03 NOTE — Patient Instructions (Signed)
Medication Instructions:  No changes *If you need a refill on your cardiac medications before your next appointment, please call your pharmacy*   Lab Work: None ordered If you have labs (blood work) drawn today and your tests are completely normal, you will receive your results only by: MyChart Message (if you have MyChart) OR A paper copy in the mail If you have any lab test that is abnormal or we need to change your treatment, we will call you to review the results.   Testing/Procedures: None ordered   Follow-Up: At [facility], you and your health needs are our priority.  As part of our continuing mission to provide you with exceptional heart care, we have created designated Provider Care Teams.  These Care Teams include your primary Cardiologist (physician) and Advanced Practice Providers (APPs -  Physician Assistants and Nurse Practitioners) who all work together to provide you with the care you need, when you need it.  We recommend signing up for the patient portal called "MyChart".  Sign up information is provided on this After Visit Summary.  MyChart is used to connect with patients for Virtual Visits (Telemedicine).  Patients are able to view lab/test results, encounter notes, upcoming appointments, etc.  Non-urgent messages can be sent to your provider as well.   To learn more about what you can do with MyChart, go to [email or web address].    Your next appointment:   6 month(s)  Provider:   You may see Muhammad Arida, MD or one of the following Advanced Practice Providers on your designated Care Team:   Christopher Berge, NP Ryan Dunn, PA-C Cadence Furth, PA-C Sheri Hammock, NP    

## 2022-09-03 NOTE — Progress Notes (Signed)
Cardiology Office Note   Date:  09/03/2022   ID:  Ruth Roth, DOB 09/03/1928, MRN [id number]  PCP:  Dale Tajique, MD  Cardiologist:   Lorine Bears, MD   Chief Complaint  Patient presents with   Follow-up    6 month follow up. Patient reports that she does have some SOBr but she thinks that is related to her afib. Patient reports that where her device is placed it "hurts all the time" Meds reviewed verbally with patient.        History of Present Illness: Ruth Roth is a 89 y.o. female who presents for a follow-up visit regarding coronary artery disease, chronic diastolic heart failure, mitral regurgitation, tachybradycardia syndrome status post pacemaker placement and persistent atrial fibrillation .  She has known history of hypertension, chronic kidney disease, peptic ulcer disease, fibrocystic breast disease, inflammatory arthritis, autoimmune hepatitis with cirrhosis and neuropathy.  She had a small non-ST elevation myocardial infarction in March 2016 with acute diastolic heart failure after a GI illness.  Echocardiogram showed normal LV systolic function. Cardiac catheterization showed showed 60% ostial left circumflex hazy stenosis which was possibly the culprit. Mild LAD/RCA disease. She was treated medically.    She had worsening heart failure 2019.  Right and left cardiac catheterization in June 2019 showed  showed mild nonobstructive coronary artery disease.  Ejection fraction was 50 to 55% with severe mitral regurgitation.  Right heart catheterization showed severely elevated filling pressures with moderate to severe pulmonary hypertension and severely reduced cardiac output.  She required milrinone and aggressive diuresis with subsequent improvement in mitral regurgitation. Atrial fibrillation is currently being treated with rate control due to symptomatic maintaining sinus rhythm.    She had a pacemaker placement in July 2023. Most recent echocardiogram in January  of this year showed normal LV systolic function with severe biatrial enlargement and mild mitral regurgitation.  She has been doing well with no recent chest pain, shortness of breath or palpitations.  She has intermittent tachycardia in spite of high-dose metoprolol and diltiazem.  Past Medical History:  Diagnosis Date   (HFpEF) heart failure with preserved ejection fraction (HCC)    a. 07/2017 Echo: EF 50-55%, no rwma, mild AS/AI, mod MR, mod dil LA, mod TR, PASP ; b. 03/2021 Echo: EF 60-65%. No rwma. Nl RV fxn. Sev BAE. Mild MR. Mild AI/AS.   Autoimmune hepatitis (HCC)    followed by Dr Marva Panda   Fibrocystic breast disease    Glaucoma    Hypercholesterolemia    Hypertension    Hypothyroidism    Inflammatory arthritis    MI (myocardial infarction) (HCC)    Mitral regurgitation    a. 07/2017 Echo: Mod MR; b. 08/2017 Cath: 4+/Severe MR; c. 08/2017 TEE: Mild MR; d. 03/2021 Echo: Mild MR.   Neuropathy    Non-obstructive Coronary artery disease    a. 05/2014 NSTEMI/Cath: LM nl, LAD mild dzs, LCX 60ost hazy (? culprit), RCA mild dzs. EF 60% by echo-->Med Rx; b. 08/2017 Cath: LM 30ost, LAD min irregs, LCX small, 40ost/p, RCA large, min irregs, RPDA/RPL1 nl, EF 50-55%. 4+MR.   Osteoarthritis    Permanent atrial fibrillation (HCC)    a. CHA2DS2VASc = 6-->Xarelto.   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    Past Surgical History:  Procedure Laterality Date   ABDOMINAL HYSTERECTOMY  1963   partial, secondary to fibroids   APPENDECTOMY     Billroth  CARDIAC CATHETERIZATION  05/12/14   [facility]   CARDIOVERSION N/A 12/26/2016   Procedure: CARDIOVERSION;  Surgeon: Iran Ouch, MD;  Location: [facility] ORS;  Service: Cardiovascular;  Laterality: N/A;   CARDIOVERSION N/A 05/16/2017   Procedure: CARDIOVERSION;  Surgeon: Iran Ouch, MD;  Location: [facility] ORS;  Service: Cardiovascular;  Laterality: N/A;   CARDIOVERSION N/A 06/09/2017    Procedure: CARDIOVERSION;  Surgeon: Iran Ouch, MD;  Location: [facility] ORS;  Service: Cardiovascular;  Laterality: N/A;   CARDIOVERSION N/A 11/30/2018   Procedure: CARDIOVERSION;  Surgeon: Iran Ouch, MD;  Location: [facility] ORS;  Service: Cardiovascular;  Laterality: N/A;   CHOLECYSTECTOMY  1998   COLONOSCOPY  2009   PACEMAKER IMPLANT N/A 09/28/2021   Procedure: PACEMAKER IMPLANT;  Surgeon: Lanier Prude, MD;  Location: [facility];  Service: Cardiovascular;  Laterality: N/A;   RIGHT/LEFT HEART CATH AND CORONARY ANGIOGRAPHY Bilateral 08/21/2017   Procedure: RIGHT/LEFT HEART CATH AND CORONARY ANGIOGRAPHY;  Surgeon: Iran Ouch, MD;  Location: [facility] INVASIVE CV LAB;  Service: Cardiovascular;  Laterality: Bilateral;   TEE WITHOUT CARDIOVERSION N/A 08/25/2017   Procedure: TRANSESOPHAGEAL ECHOCARDIOGRAM (TEE);  Surgeon: Dolores Patty, MD;  Location: [facility] ENDOSCOPY;  Service: Cardiovascular;  Laterality: N/A;   UPPER GI ENDOSCOPY  2004     Current Outpatient Medications  Medication Sig Dispense Refill   diltiazem (CARDIZEM CD) 240 MG 24 hr capsule Take 240 mg by mouth daily.     dorzolamide (TRUSOPT) 2 % ophthalmic solution Place 1 drop into the left eye 2 (two) times daily.     fluticasone (FLONASE) 50 MCG/ACT nasal spray Place 2 sprays into both nostrils daily as needed for allergies.     gabapentin (NEURONTIN) 300 MG capsule One capsule tid and two capsules q hs. 150 capsule 2   hydroxychloroquine (PLAQUENIL) 200 MG tablet Take 200 mg by mouth every other day.     ipratropium (ATROVENT) 0.03 % nasal spray Place 2 sprays into both nostrils 3 (three) times daily as needed for rhinitis.     latanoprost (XALATAN) 0.005 % ophthalmic solution Place 1 drop into both eyes at bedtime.      levothyroxine (SYNTHROID) 125 MCG tablet Take 1 tablet (125 mcg total) by mouth daily. 90 tablet 3   metoprolol tartrate (LOPRESSOR) 100 MG tablet TAKE 1 AND 1/2 TABLETS(150 MG) BY MOUTH TWICE  DAILY 270 tablet 1   mometasone (ELOCON) 0.1 % cream Apply 1 Application topically daily. Appear pea sized ( or less) amount to external ear, and slightly in canal. 15 g 0   Multiple Vitamin (MULTI-VITAMIN) tablet Take 1 tablet by mouth at bedtime.      patiromer (VELTASSA) 8.4 g packet Take 1 packet (8.4 g total) by mouth daily. 30 each 0   Probiotic Product (ALIGN) 4 MG CAPS Take one capsule q day 30 capsule 0   torsemide (DEMADEX) 20 MG tablet Take 1 tablet (20 mg total) by mouth every other day. 45 tablet 3   traMADol (ULTRAM) 50 MG tablet TAKE 1 TABLET(50 MG) BY MOUTH TWICE DAILY AS NEEDED FOR PAIN 60 tablet 1   ursodiol (ACTIGALL) 300 MG capsule Take 300-600 mg by mouth See admin instructions. Take 2 capsules (600 mg) daily in the morning   & 1 capsule (300 mg) at night.     vitamin B-12 (CYANOCOBALAMIN) 500 MCG tablet Take 500 mcg by mouth every Monday.     XARELTO 15 MG TABS tablet TAKE 1 TABLET(15 MG) BY MOUTH DAILY  WITH SUPPER 90 tablet 1   allopurinol (ZYLOPRIM) 100 MG tablet TAKE 1/2 TABLET(50 MG) BY MOUTH DAILY 45 tablet 1   No current facility-administered medications for this visit.    Allergies:   Statins, Hydroxychloroquine, Haldol [haloperidol lactate], Librax [chlordiazepoxide-clidinium], Plavix [clopidogrel bisulfate], and Ramipril    Social History:  The patient  reports that she has never smoked. She has never used smokeless tobacco. She reports that she does not drink alcohol and does not use drugs.   Family History:  The patient's family history includes Colon cancer in her daughter; Esophageal cancer in her brother; Ovarian cancer in her daughter; Stroke in her mother.    ROS:  Please see the history of present illness.   Otherwise, review of systems are positive for none.   All other systems are reviewed and negative.    PHYSICAL EXAM: VS:  BP 120/62 (BP Location: Left Arm, Patient Position: Sitting, Cuff Size: Normal)   Pulse (!) 112   Ht 5\' 2"  (1.575 m)   Wt  133 lb 12.8 oz (60.7 kg)   BMI 24.47 kg/m  , BMI Body mass index is 24.47 kg/m. GEN: Well nourished, well developed, in no acute distress  HEENT: normal  Neck: No JVD, carotid bruits, or masses Cardiac: Irregularly irregular; no rubs, or gallops.  1/ 6 systolic ejection murmur at the aortic area.  Mild bilateral leg edema Respiratory: Clear to auscultation GI: soft, nontender, nondistended, + BS MS: no deformity or atrophy  Skin: warm and dry, no rash Neuro:  Strength and sensation are intact Psych: euthymic mood, full affect   EKG:  EKG is ordered today. The ekg ordered today demonstrates: Atrial fibrillation with rapid ventricular response.  Recent Labs: 04/02/2022: TSH 3.41 04/04/2022: B Natriuretic Peptide 394.0 07/04/2022: ALT 18; BUN 26; Creatinine, Ser 1.21; Hemoglobin 12.4; Platelets 225.0; Sodium 138 08/02/2022: Potassium 4.3    Lipid Panel    Component Value Date/Time   CHOL 142 07/04/2022 1006   TRIG 90.0 07/04/2022 1006   HDL 61.60 07/04/2022 1006   CHOLHDL 2 07/04/2022 1006   VLDL 18.0 07/04/2022 1006   LDLCALC 62 07/04/2022 1006   LDLDIRECT 143.5 12/17/2012 0955      Wt Readings from Last 3 Encounters:  09/03/22 133 lb 12.8 oz (60.7 kg)  08/13/22 134 lb 6.4 oz (61 kg)  07/18/22 130 lb (59 kg)       ASSESSMENT AND PLAN:  1.    Permanent atrial fibrillation: She has intermittent tachycardia but ventricular rate is overall reasonably controlled.  Continue metoprolol and diltiazem.  Continue anticoagulation with low-dose Xarelto 15 mg once daily.  If her creatinine continues to run above 1.5, I will consider switching her to low-dose Eliquis to minimize the risk of bleeding. Most recent creatinine was 1.2.  2.  Chronic diastolic heart failure: She appears to be euvolemic on current dose of torsemide.  Recent labs showed stable renal function with a creatinine of 1.2.   3. Essential hypertension: Blood pressures well controlled on current medications.   4.   Functional mitral regurgitation: This was mild on recent echocardiogram.  5.  Coronary artery disease involving native coronary arteries without angina: Continue medical therapy.   Disposition:   FU with me in 6 months.  Signed,  Lorine Bears, MD  09/03/2022 2:04 PM    [facility]

## 2022-09-05 NOTE — Addendum Note (Signed)
Addended bySherri Rad C on: 09/05/2022 10:16 AM   Modules accepted: Orders

## 2022-09-18 ENCOUNTER — Inpatient Hospital Stay: Payer: Medicare Other | Attending: Oncology

## 2022-09-18 DIAGNOSIS — N183 Chronic kidney disease, stage 3 unspecified: Secondary | ICD-10-CM | POA: Insufficient documentation

## 2022-09-18 DIAGNOSIS — K219 Gastro-esophageal reflux disease without esophagitis: Secondary | ICD-10-CM | POA: Diagnosis not present

## 2022-09-18 DIAGNOSIS — D631 Anemia in chronic kidney disease: Secondary | ICD-10-CM | POA: Diagnosis not present

## 2022-09-18 DIAGNOSIS — R040 Epistaxis: Secondary | ICD-10-CM | POA: Insufficient documentation

## 2022-09-18 DIAGNOSIS — Z888 Allergy status to other drugs, medicaments and biological substances status: Secondary | ICD-10-CM | POA: Diagnosis not present

## 2022-09-18 DIAGNOSIS — Z800 Family history of malignant neoplasm of digestive organs: Secondary | ICD-10-CM | POA: Insufficient documentation

## 2022-09-18 DIAGNOSIS — R5383 Other fatigue: Secondary | ICD-10-CM | POA: Insufficient documentation

## 2022-09-18 DIAGNOSIS — E7800 Pure hypercholesterolemia, unspecified: Secondary | ICD-10-CM | POA: Insufficient documentation

## 2022-09-18 DIAGNOSIS — Z7901 Long term (current) use of anticoagulants: Secondary | ICD-10-CM | POA: Diagnosis not present

## 2022-09-18 DIAGNOSIS — Z823 Family history of stroke: Secondary | ICD-10-CM | POA: Diagnosis not present

## 2022-09-18 DIAGNOSIS — E538 Deficiency of other specified B group vitamins: Secondary | ICD-10-CM | POA: Insufficient documentation

## 2022-09-18 DIAGNOSIS — I130 Hypertensive heart and chronic kidney disease with heart failure and stage 1 through stage 4 chronic kidney disease, or unspecified chronic kidney disease: Secondary | ICD-10-CM | POA: Insufficient documentation

## 2022-09-18 DIAGNOSIS — Z8711 Personal history of peptic ulcer disease: Secondary | ICD-10-CM | POA: Diagnosis not present

## 2022-09-18 DIAGNOSIS — Z801 Family history of malignant neoplasm of trachea, bronchus and lung: Secondary | ICD-10-CM | POA: Diagnosis not present

## 2022-09-18 DIAGNOSIS — E611 Iron deficiency: Secondary | ICD-10-CM | POA: Insufficient documentation

## 2022-09-18 DIAGNOSIS — Z8041 Family history of malignant neoplasm of ovary: Secondary | ICD-10-CM | POA: Insufficient documentation

## 2022-09-18 DIAGNOSIS — Z79899 Other long term (current) drug therapy: Secondary | ICD-10-CM | POA: Insufficient documentation

## 2022-09-18 DIAGNOSIS — I2510 Atherosclerotic heart disease of native coronary artery without angina pectoris: Secondary | ICD-10-CM | POA: Insufficient documentation

## 2022-09-18 DIAGNOSIS — I5032 Chronic diastolic (congestive) heart failure: Secondary | ICD-10-CM | POA: Insufficient documentation

## 2022-09-18 DIAGNOSIS — D500 Iron deficiency anemia secondary to blood loss (chronic): Secondary | ICD-10-CM

## 2022-09-18 DIAGNOSIS — Z9049 Acquired absence of other specified parts of digestive tract: Secondary | ICD-10-CM | POA: Insufficient documentation

## 2022-09-18 DIAGNOSIS — I4891 Unspecified atrial fibrillation: Secondary | ICD-10-CM | POA: Insufficient documentation

## 2022-09-18 DIAGNOSIS — Z90710 Acquired absence of both cervix and uterus: Secondary | ICD-10-CM | POA: Insufficient documentation

## 2022-09-18 DIAGNOSIS — E039 Hypothyroidism, unspecified: Secondary | ICD-10-CM | POA: Diagnosis not present

## 2022-09-18 DIAGNOSIS — Z79890 Hormone replacement therapy (postmenopausal): Secondary | ICD-10-CM | POA: Insufficient documentation

## 2022-09-18 LAB — COMPREHENSIVE METABOLIC PANEL
ALT: 21 U/L (ref 0–44)
AST: 17 U/L (ref 15–41)
Albumin: 4 g/dL (ref 3.5–5.0)
Alkaline Phosphatase: 101 U/L (ref 38–126)
Anion gap: 11 (ref 5–15)
BUN: 39 mg/dL — ABNORMAL HIGH (ref 8–23)
CO2: 22 mmol/L (ref 22–32)
Calcium: 8.9 mg/dL (ref 8.9–10.3)
Chloride: 107 mmol/L (ref 98–111)
Creatinine, Ser: 1.11 mg/dL — ABNORMAL HIGH (ref 0.44–1.00)
GFR, Estimated: 47 mL/min — ABNORMAL LOW (ref >60)
Glucose, Bld: 100 mg/dL — ABNORMAL HIGH (ref 70–99)
Potassium: 5.1 mmol/L (ref 3.5–5.1)
Sodium: 140 mmol/L (ref 135–145)
Total Bilirubin: 0.6 mg/dL (ref 0.3–1.2)
Total Protein: 6.9 g/dL (ref 6.5–8.1)

## 2022-09-18 LAB — CBC WITH DIFFERENTIAL/PLATELET
Abs Immature Granulocytes: 0.02 10*3/uL (ref 0.00–0.07)
Basophils Absolute: 0 10*3/uL (ref 0.0–0.1)
Basophils Relative: 1 %
Eosinophils Absolute: 0.2 10*3/uL (ref 0.0–0.5)
Eosinophils Relative: 4 %
HCT: 39.2 % (ref 36.0–46.0)
Hemoglobin: 11.9 g/dL — ABNORMAL LOW (ref 12.0–15.0)
Immature Granulocytes: 0 %
Lymphocytes Relative: 17 %
Lymphs Abs: 0.8 10*3/uL (ref 0.7–4.0)
MCH: 31.5 pg (ref 26.0–34.0)
MCHC: 30.4 g/dL (ref 30.0–36.0)
MCV: 103.7 fL — ABNORMAL HIGH (ref 80.0–100.0)
Monocytes Absolute: 0.6 10*3/uL (ref 0.1–1.0)
Monocytes Relative: 13 %
Neutro Abs: 3.1 10*3/uL (ref 1.7–7.7)
Neutrophils Relative %: 65 %
Platelets: 221 10*3/uL (ref 150–400)
RBC: 3.78 MIL/uL — ABNORMAL LOW (ref 3.87–5.11)
RDW: 15.2 % (ref 11.5–15.5)
WBC: 4.8 10*3/uL (ref 4.0–10.5)
nRBC: 0 % (ref 0.0–0.2)

## 2022-09-18 LAB — IRON AND TIBC
Iron: 73 ug/dL (ref 28–170)
Saturation Ratios: 15 % (ref 10.4–31.8)
TIBC: 490 ug/dL — ABNORMAL HIGH (ref 250–450)
UIBC: 417 ug/dL

## 2022-09-18 LAB — LACTATE DEHYDROGENASE: LDH: 170 U/L (ref 98–192)

## 2022-09-18 LAB — VITAMIN B12: Vitamin B-12: 846 pg/mL (ref 180–914)

## 2022-09-18 LAB — FERRITIN: Ferritin: 22 ng/mL (ref 11–307)

## 2022-09-18 LAB — FOLATE: Folate: >40 ng/mL (ref >5.9)

## 2022-09-23 ENCOUNTER — Encounter: Payer: Self-pay | Admitting: Oncology

## 2022-09-23 ENCOUNTER — Inpatient Hospital Stay (HOSPITAL_BASED_OUTPATIENT_CLINIC_OR_DEPARTMENT_OTHER): Payer: Medicare Other | Admitting: Oncology

## 2022-09-23 VITALS — BP 125/80 | HR 69 | Temp 97.4°F | Resp 18 | Wt 135.1 lb

## 2022-09-23 DIAGNOSIS — N189 Chronic kidney disease, unspecified: Secondary | ICD-10-CM | POA: Diagnosis not present

## 2022-09-23 DIAGNOSIS — N183 Chronic kidney disease, stage 3 unspecified: Secondary | ICD-10-CM | POA: Diagnosis not present

## 2022-09-23 DIAGNOSIS — I4891 Unspecified atrial fibrillation: Secondary | ICD-10-CM | POA: Diagnosis not present

## 2022-09-23 DIAGNOSIS — N1832 Chronic kidney disease, stage 3b: Secondary | ICD-10-CM

## 2022-09-23 DIAGNOSIS — E538 Deficiency of other specified B group vitamins: Secondary | ICD-10-CM

## 2022-09-23 DIAGNOSIS — D500 Iron deficiency anemia secondary to blood loss (chronic): Secondary | ICD-10-CM

## 2022-09-23 DIAGNOSIS — D631 Anemia in chronic kidney disease: Secondary | ICD-10-CM | POA: Diagnosis not present

## 2022-09-23 DIAGNOSIS — E611 Iron deficiency: Secondary | ICD-10-CM | POA: Diagnosis not present

## 2022-09-23 DIAGNOSIS — I130 Hypertensive heart and chronic kidney disease with heart failure and stage 1 through stage 4 chronic kidney disease, or unspecified chronic kidney disease: Secondary | ICD-10-CM | POA: Diagnosis not present

## 2022-09-23 NOTE — Assessment & Plan Note (Signed)
Encourage oral hydration and avoid nephrotoxins.   

## 2022-09-23 NOTE — Assessment & Plan Note (Signed)
History of iron deficiency in the context of chronic kidney disease. Lab Results  Component Value Date   HGB 11.9 (L) 09/18/2022   TIBC 490 (H) 09/18/2022   IRONPCTSAT 15 09/18/2022   FERRITIN 22 09/18/2022    Hemoglobin has slightly decreased.  Iron panel is consistent with early iron deficiency. She cannot tolerate oral iron supplementation.  I recommend IV Venofer treatments and the patient declined. Recommend patient to take iron rich food as well as multivitamin that contains iron.  Repeat levels in 6 months

## 2022-09-23 NOTE — Progress Notes (Signed)
Hematology/Oncology Progress note [phone] [phone]       CHIEF COMPLAINTS/REASON FOR VISIT:  Follow-up for anemia  ASSESSMENT & PLAN:   Anemia in chronic kidney disease (CKD) History of iron deficiency in the context of chronic kidney disease. Lab Results  Component Value Date   HGB 11.9 (L) 09/18/2022   TIBC 490 (H) 09/18/2022   IRONPCTSAT 15 09/18/2022   FERRITIN 22 09/18/2022    Hemoglobin has slightly decreased.  Iron panel is consistent with early iron deficiency. She cannot tolerate oral iron supplementation.  I recommend IV Venofer treatments and the patient declined. Recommend patient to take iron rich food as well as multivitamin that contains iron.  Repeat levels in 6 months  CKD (chronic kidney disease) stage 3, GFR 30-59 ml/min (HCC) Encourage oral hydration and avoid nephrotoxins.    B12 deficiency B12 level is stable.  I recommend patient to continue over-the-counter vitamin B12 supplementation once per week.   Orders Placed This Encounter  Procedures   CBC with Differential (Cancer Center Only)    Standing Status:   Future    Standing Expiration Date:   09/23/2023   CMP (Cancer Center only)    Standing Status:   Future    Standing Expiration Date:   09/23/2023   Iron and TIBC    Standing Status:   Future    Standing Expiration Date:   09/23/2023   Ferritin    Standing Status:   Future    Standing Expiration Date:   09/23/2023   Vitamin B12    Standing Status:   Future    Standing Expiration Date:   09/23/2023   Lactate dehydrogenase    Standing Status:   Future    Standing Expiration Date:   09/23/2023   Folate    Standing Status:   Future    Standing Expiration Date:   09/23/2023   Follow-up in 6 months. All questions were answered. The patient knows to call the clinic with any problems, questions or concerns.  Rickard Patience, MD, PhD [facility] Hematology Oncology 09/23/2022   HISTORY OF PRESENTING ILLNESS:  Ruth Roth is a  89 y.o.  female presents for follow-up of iron deficiency anemia. She has a history of iron deficiency anemia, cannot tolerate oral iron supplementation.  Previously tolerated IV Feraheme treatments. She takes Xarelto for A Fib. Has recurrent epistaxis. History of IDA, cannot tolerate oral iron supplements.   INTERVAL HISTORY Ruth Roth is a 89 y.o. female who has above history reviewed by me today presents for follow up visit for management of anemia She reports feeling well.  She takes vitamin B12 500 mcg once a week. She has no new concerns today.Denies hematochezia, hematuria, hematemesis, epistaxis, black tarry stool or easy bruising.      Review of Systems  Constitutional:  Positive for fatigue. Negative for appetite change, chills and fever.  HENT:   Negative for hearing loss and voice change.   Eyes:  Negative for eye problems.  Respiratory:  Negative for chest tightness and cough.   Cardiovascular:  Negative for chest pain.  Gastrointestinal:  Negative for abdominal distention, abdominal pain and blood in stool.  Endocrine: Negative for hot flashes.  Genitourinary:  Negative for difficulty urinating and frequency.   Musculoskeletal:  Negative for arthralgias.  Skin:  Negative for itching and rash.  Neurological:  Negative for extremity weakness.  Hematological:  Negative for adenopathy.  Psychiatric/Behavioral:  Negative for confusion.     MEDICAL  HISTORY:  Past Medical History:  Diagnosis Date   (HFpEF) heart failure with preserved ejection fraction (HCC)    a. 07/2017 Echo: EF 50-55%, no rwma, mild AS/AI, mod MR, mod dil LA, mod TR, PASP ; b. 03/2021 Echo: EF 60-65%. No rwma. Nl RV fxn. Sev BAE. Mild MR. Mild AI/AS.   Autoimmune hepatitis (HCC)    followed by Dr Marva Panda   Fibrocystic breast disease    Glaucoma    Hypercholesterolemia    Hypertension    Hypothyroidism    Inflammatory arthritis    MI (myocardial infarction) (HCC)    Mitral  regurgitation    a. 07/2017 Echo: Mod MR; b. 08/2017 Cath: 4+/Severe MR; c. 08/2017 TEE: Mild MR; d. 03/2021 Echo: Mild MR.   Neuropathy    Non-obstructive Coronary artery disease    a. 05/2014 NSTEMI/Cath: LM nl, LAD mild dzs, LCX 60ost hazy (? culprit), RCA mild dzs. EF 60% by echo-->Med Rx; b. 08/2017 Cath: LM 30ost, LAD min irregs, LCX small, 40ost/p, RCA large, min irregs, RPDA/RPL1 nl, EF 50-55%. 4+MR.   Osteoarthritis    Permanent atrial fibrillation (HCC)    a. CHA2DS2VASc = 6-->Xarelto.   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    SURGICAL HISTORY: Past Surgical History:  Procedure Laterality Date   ABDOMINAL HYSTERECTOMY  1963   partial, secondary to fibroids   APPENDECTOMY     Billroth     CARDIAC CATHETERIZATION  05/12/14   [facility]   CARDIOVERSION N/A 12/26/2016   Procedure: CARDIOVERSION;  Surgeon: Iran Ouch, MD;  Location: [facility] ORS;  Service: Cardiovascular;  Laterality: N/A;   CARDIOVERSION N/A 05/16/2017   Procedure: CARDIOVERSION;  Surgeon: Iran Ouch, MD;  Location: [facility] ORS;  Service: Cardiovascular;  Laterality: N/A;   CARDIOVERSION N/A 06/09/2017   Procedure: CARDIOVERSION;  Surgeon: Iran Ouch, MD;  Location: [facility] ORS;  Service: Cardiovascular;  Laterality: N/A;   CARDIOVERSION N/A 11/30/2018   Procedure: CARDIOVERSION;  Surgeon: Iran Ouch, MD;  Location: [facility] ORS;  Service: Cardiovascular;  Laterality: N/A;   CHOLECYSTECTOMY  1998   COLONOSCOPY  2009   PACEMAKER IMPLANT N/A 09/28/2021   Procedure: PACEMAKER IMPLANT;  Surgeon: Lanier Prude, MD;  Location: [facility]VAS[facility] CV LAB;  Service: Cardiovascular;  Laterality: N/A;   RIGHT/LEFT HEART CATH AND CORONARY ANGIOGRAPHY Bilateral 08/21/2017   Procedure: RIGHT/LEFT HEART CATH AND CORONARY ANGIOGRAPHY;  Surgeon: Iran Ouch, MD;  Location: [facility] INVASIVE CV LAB;  Service: Cardiovascular;  Laterality: Bilateral;   TEE WITHOUT CARDIOVERSION N/A  08/25/2017   Procedure: TRANSESOPHAGEAL ECHOCARDIOGRAM (TEE);  Surgeon: Dolores Patty, MD;  Location: [facility] ENDOSCOPY;  Service: Cardiovascular;  Laterality: N/A;   UPPER GI ENDOSCOPY  2004    SOCIAL HISTORY: Social History   Socioeconomic History   Marital status: Widowed    Spouse name: Not on file   Number of children: 2   Years of education: Not on file   Highest education level: Not on file  Occupational History   Occupation: retired  Tobacco Use   Smoking status: Never   Smokeless tobacco: Never   Tobacco comments:    Never smoke 01/23/22  Vaping Use   Vaping status: Never Used  Substance and Sexual Activity   Alcohol use: No    Alcohol/week: 0.0 standard drinks of alcohol   Drug use: No   Sexual activity: Never  Other Topics Concern   Not on file  Social History Narrative  Not on file   Social Determinants of Health   Financial Resource Strain: Low Risk  (06/17/2022)   Overall Financial Resource Strain (CARDIA)    Difficulty of Paying Living Expenses: Not hard at all  Food Insecurity: No Food Insecurity (06/17/2022)   Hunger Vital Sign    Worried About Running Out of Food in the Last Year: Never true    Ran Out of Food in the Last Year: Never true  Transportation Needs: No Transportation Needs (06/17/2022)   PRAPARE - Administrator, Civil Service (Medical): No    Lack of Transportation (Non-Medical): No  Physical Activity: Insufficiently Active (06/17/2022)   Exercise Vital Sign    Days of Exercise per Week: 3 days    Minutes of Exercise per Session: 40 min  Stress: No Stress Concern Present (06/17/2022)   [employer] of Occupational Health - Occupational Stress Questionnaire    Feeling of Stress : Not at all  Social Connections: Unknown (06/17/2022)   Social Connection and Isolation Panel [NHANES]    Frequency of Communication with Friends and Family: More than three times a week    Frequency of Social Gatherings with Friends and Family: More  than three times a week    Attends Religious Services: Not on file    Active Member of Clubs or Organizations: Not on file    Attends Banker Meetings: Not on file    Marital Status: Not on file  Intimate Partner Violence: Not At Risk (06/17/2022)   Humiliation, Afraid, Rape, and Kick questionnaire    Fear of Current or Ex-Partner: No    Emotionally Abused: No    Physically Abused: No    Sexually Abused: No    FAMILY HISTORY: Family History  Problem Relation Age of Onset   Stroke Mother    Esophageal cancer Brother        also had lung cancer   Ovarian cancer Daughter    Colon cancer Daughter    Breast cancer Neg Hx     ALLERGIES:  is allergic to statins, hydroxychloroquine, haldol [haloperidol lactate], librax [chlordiazepoxide-clidinium], plavix [clopidogrel bisulfate], and ramipril.  MEDICATIONS:  Current Outpatient Medications  Medication Sig Dispense Refill   allopurinol (ZYLOPRIM) 100 MG tablet TAKE 1/2 TABLET(50 MG) BY MOUTH DAILY 45 tablet 1   diltiazem (CARDIZEM CD) 240 MG 24 hr capsule Take 240 mg by mouth daily.     dorzolamide (TRUSOPT) 2 % ophthalmic solution Place 1 drop into the left eye 2 (two) times daily.     fluticasone (FLONASE) 50 MCG/ACT nasal spray Place 2 sprays into both nostrils daily as needed for allergies.     gabapentin (NEURONTIN) 300 MG capsule One capsule tid and two capsules q hs. 150 capsule 2   hydroxychloroquine (PLAQUENIL) 200 MG tablet Take 200 mg by mouth every other day.     ipratropium (ATROVENT) 0.03 % nasal spray Place 2 sprays into both nostrils 3 (three) times daily as needed for rhinitis.     latanoprost (XALATAN) 0.005 % ophthalmic solution Place 1 drop into both eyes at bedtime.      levothyroxine (SYNTHROID) 125 MCG tablet Take 1 tablet (125 mcg total) by mouth daily. 90 tablet 3   metoprolol tartrate (LOPRESSOR) 100 MG tablet TAKE 1 AND 1/2 TABLETS(150 MG) BY MOUTH TWICE DAILY 270 tablet 1   mometasone (ELOCON) 0.1 %  cream Apply 1 Application topically daily. Appear pea sized ( or less) amount to external ear, and slightly in canal.  15 g 0   Multiple Vitamin (MULTI-VITAMIN) tablet Take 1 tablet by mouth at bedtime.      patiromer (VELTASSA) 8.4 g packet Take 1 packet (8.4 g total) by mouth daily. 30 each 0   Probiotic Product (ALIGN) 4 MG CAPS Take one capsule q day 30 capsule 0   torsemide (DEMADEX) 20 MG tablet Take 1 tablet (20 mg total) by mouth every other day. 45 tablet 3   traMADol (ULTRAM) 50 MG tablet TAKE 1 TABLET(50 MG) BY MOUTH TWICE DAILY AS NEEDED FOR PAIN 60 tablet 1   ursodiol (ACTIGALL) 300 MG capsule Take 300-600 mg by mouth See admin instructions. Take 2 capsules (600 mg) daily in the morning   & 1 capsule (300 mg) at night.     vitamin B-12 (CYANOCOBALAMIN) 500 MCG tablet Take 500 mcg by mouth every Monday.     XARELTO 15 MG TABS tablet TAKE 1 TABLET(15 MG) BY MOUTH DAILY WITH SUPPER 90 tablet 1   No current facility-administered medications for this visit.     PHYSICAL EXAMINATION: ECOG PERFORMANCE STATUS: 1 - Symptomatic but completely ambulatory Vitals:   09/23/22 1315  BP: 125/80  Pulse: 69  Resp: 18  Temp: (!) 97.4 F (36.3 C)   Filed Weights   09/23/22 1315  Weight: 135 lb 1.6 oz (61.3 kg)    Physical Exam Constitutional:      General: She is not in acute distress. HENT:     Head: Normocephalic and atraumatic.  Eyes:     General: No scleral icterus.    Pupils: Pupils are equal, round, and reactive to light.  Cardiovascular:     Rate and Rhythm: Normal rate and regular rhythm.     Heart sounds: Normal heart sounds.     Comments: Irregular rhythm.  Pulmonary:     Effort: Pulmonary effort is normal. No respiratory distress.     Breath sounds: No wheezing.  Abdominal:     General: Bowel sounds are normal. There is no distension.     Palpations: Abdomen is soft. There is no mass.     Tenderness: There is no abdominal tenderness.  Musculoskeletal:         General: No deformity. Normal range of motion.     Cervical back: Normal range of motion and neck supple.  Skin:    General: Skin is warm and dry.     Findings: No erythema or rash.  Neurological:     Mental Status: She is alert and oriented to person, place, and time. Mental status is at baseline.     Cranial Nerves: No cranial nerve deficit.     Coordination: Coordination normal.  Psychiatric:        Mood and Affect: Mood normal.      LABORATORY DATA:  I have reviewed the data as listed Lab Results  Component Value Date   WBC 4.8 09/18/2022   HGB 11.9 (L) 09/18/2022   HCT 39.2 09/18/2022   MCV 103.7 (H) 09/18/2022   PLT 221 09/18/2022   Recent Labs    02/27/22 0946 04/02/22 1506 04/04/22 1505 07/04/22 1006 07/12/22 1434 07/29/22 1427 08/02/22 1458 09/18/22 1254  NA 139 138 137 138  --   --   --  140  K 4.9 5.6 No hemolysis seen* 3.9 5.2*   < > 5.9* 4.3 5.1  CL 108 105 104 106  --   --   --  107  CO2 23 23 22 25   --   --   --  22  GLUCOSE 83 78 95 87  --   --   --  100*  BUN 34* 39* 36* 26*  --   --   --  39*  CREATININE 1.34* 1.27* 1.38* 1.21*  --   --   --  1.11*  CALCIUM 8.5 9.6 9.2 8.4  --   --   --  8.9  GFRNONAA  --   --  36*  --   --   --   --  47*  PROT 6.2 6.3  --  6.3  --   --   --  6.9  ALBUMIN 4.0 4.1  --  4.0  --   --   --  4.0  AST 16 12  --  17  --   --   --  17  ALT 17 15  --  18  --   --   --  21  ALKPHOS 103 104  --  106  --   --   --  101  BILITOT 0.4 0.4  --  0.4  --   --   --  0.6  BILIDIR 0.1 0.1  --  0.1  --   --   --   --    < > = values in this interval not displayed.   Iron/TIBC/Ferritin/ %Sat    Component Value Date/Time   IRON 73 09/18/2022 1254   TIBC 490 (H) 09/18/2022 1254   FERRITIN 22 09/18/2022 1254   IRONPCTSAT 15 09/18/2022 1254    suggest chronic kidney disease.

## 2022-09-23 NOTE — Assessment & Plan Note (Signed)
B12 level is stable.  I recommend patient to continue over-the-counter vitamin B12 supplementation once per week.

## 2022-09-30 ENCOUNTER — Ambulatory Visit: Payer: Medicare Other

## 2022-09-30 DIAGNOSIS — I495 Sick sinus syndrome: Secondary | ICD-10-CM | POA: Diagnosis not present

## 2022-10-02 LAB — CUP PACEART REMOTE DEVICE CHECK
Battery Remaining Longevity: 86 mo
Battery Remaining Percentage: 90 %
Battery Voltage: 3.01 V
Brady Statistic AP VP Percent: 0 %
Brady Statistic AP VS Percent: 0 %
Brady Statistic AS VP Percent: 86 %
Brady Statistic AS VS Percent: 13 %
Brady Statistic RA Percent Paced: 0 %
Brady Statistic RV Percent Paced: 36 %
Date Time Interrogation Session: 20240722020021
Implantable Lead Connection Status: 753985
Implantable Lead Connection Status: 753985
Implantable Lead Implant Date: 20230721
Implantable Lead Implant Date: 20230721
Implantable Lead Location: 753859
Implantable Lead Location: 753860
Implantable Pulse Generator Implant Date: 20230721
Lead Channel Impedance Value: 460 Ohm
Lead Channel Impedance Value: 460 Ohm
Lead Channel Pacing Threshold Amplitude: 0.75 V
Lead Channel Pacing Threshold Pulse Width: 0.5 ms
Lead Channel Sensing Intrinsic Amplitude: 1.8 mV
Lead Channel Sensing Intrinsic Amplitude: 12 mV
Lead Channel Setting Pacing Amplitude: 2.5 V
Lead Channel Setting Pacing Amplitude: 3.5 V
Lead Channel Setting Pacing Pulse Width: 0.5 ms
Lead Channel Setting Sensing Sensitivity: 2 mV
Pulse Gen Model: 2272
Pulse Gen Serial Number: 8087042

## 2022-10-07 ENCOUNTER — Telehealth: Payer: Self-pay | Admitting: Internal Medicine

## 2022-10-07 DIAGNOSIS — M810 Age-related osteoporosis without current pathological fracture: Secondary | ICD-10-CM

## 2022-10-07 DIAGNOSIS — E785 Hyperlipidemia, unspecified: Secondary | ICD-10-CM

## 2022-10-07 DIAGNOSIS — N1832 Chronic kidney disease, stage 3b: Secondary | ICD-10-CM

## 2022-10-07 DIAGNOSIS — K7460 Unspecified cirrhosis of liver: Secondary | ICD-10-CM

## 2022-10-07 NOTE — Telephone Encounter (Signed)
Patient need lab orders.

## 2022-10-07 NOTE — Telephone Encounter (Signed)
Orders placed for labs

## 2022-10-14 ENCOUNTER — Other Ambulatory Visit (INDEPENDENT_AMBULATORY_CARE_PROVIDER_SITE_OTHER): Payer: Medicare Other

## 2022-10-14 DIAGNOSIS — N1832 Chronic kidney disease, stage 3b: Secondary | ICD-10-CM | POA: Diagnosis not present

## 2022-10-14 DIAGNOSIS — M810 Age-related osteoporosis without current pathological fracture: Secondary | ICD-10-CM

## 2022-10-14 DIAGNOSIS — K7460 Unspecified cirrhosis of liver: Secondary | ICD-10-CM | POA: Diagnosis not present

## 2022-10-14 DIAGNOSIS — E785 Hyperlipidemia, unspecified: Secondary | ICD-10-CM

## 2022-10-14 LAB — HEPATIC FUNCTION PANEL
ALT: 16 U/L (ref 0–35)
AST: 13 U/L (ref 0–37)
Albumin: 4.1 g/dL (ref 3.5–5.2)
Alkaline Phosphatase: 100 U/L (ref 39–117)
Bilirubin, Direct: 0.1 mg/dL (ref 0.0–0.3)
Total Bilirubin: 0.4 mg/dL (ref 0.2–1.2)
Total Protein: 6.9 g/dL (ref 6.0–8.3)

## 2022-10-14 LAB — BASIC METABOLIC PANEL
BUN: 40 mg/dL — ABNORMAL HIGH (ref 6–23)
CO2: 23 mEq/L (ref 19–32)
Calcium: 9.2 mg/dL (ref 8.4–10.5)
Chloride: 103 mEq/L (ref 96–112)
Creatinine, Ser: 1.43 mg/dL — ABNORMAL HIGH (ref 0.40–1.20)
GFR: 31.87 mL/min — ABNORMAL LOW (ref >60.00)
Glucose, Bld: 83 mg/dL (ref 70–99)
Potassium: 4.5 mEq/L (ref 3.5–5.1)
Sodium: 136 mEq/L (ref 135–145)

## 2022-10-14 LAB — LIPID PANEL
Cholesterol: 167 mg/dL (ref 0–200)
HDL: 66.4 mg/dL (ref >39.00)
LDL Cholesterol: 82 mg/dL (ref 0–99)
NonHDL: 100.16
Total CHOL/HDL Ratio: 3
Triglycerides: 91 mg/dL (ref 0.0–149.0)
VLDL: 18.2 mg/dL (ref 0.0–40.0)

## 2022-10-14 LAB — VITAMIN D 25 HYDROXY (VIT D DEFICIENCY, FRACTURES): VITD: 111.94 ng/mL (ref 30.00–100.00)

## 2022-10-16 ENCOUNTER — Encounter: Payer: Self-pay | Admitting: Internal Medicine

## 2022-10-16 ENCOUNTER — Ambulatory Visit (INDEPENDENT_AMBULATORY_CARE_PROVIDER_SITE_OTHER): Payer: Medicare Other | Admitting: Internal Medicine

## 2022-10-16 VITALS — BP 128/74 | HR 105 | Temp 97.9°F | Resp 16 | Ht 62.0 in | Wt 137.6 lb

## 2022-10-16 DIAGNOSIS — N189 Chronic kidney disease, unspecified: Secondary | ICD-10-CM

## 2022-10-16 DIAGNOSIS — I4819 Other persistent atrial fibrillation: Secondary | ICD-10-CM

## 2022-10-16 DIAGNOSIS — E785 Hyperlipidemia, unspecified: Secondary | ICD-10-CM | POA: Diagnosis not present

## 2022-10-16 DIAGNOSIS — I340 Nonrheumatic mitral (valve) insufficiency: Secondary | ICD-10-CM

## 2022-10-16 DIAGNOSIS — K7460 Unspecified cirrhosis of liver: Secondary | ICD-10-CM | POA: Diagnosis not present

## 2022-10-16 DIAGNOSIS — M25562 Pain in left knee: Secondary | ICD-10-CM

## 2022-10-16 DIAGNOSIS — G629 Polyneuropathy, unspecified: Secondary | ICD-10-CM | POA: Diagnosis not present

## 2022-10-16 DIAGNOSIS — I5032 Chronic diastolic (congestive) heart failure: Secondary | ICD-10-CM

## 2022-10-16 DIAGNOSIS — G6289 Other specified polyneuropathies: Secondary | ICD-10-CM

## 2022-10-16 DIAGNOSIS — E7800 Pure hypercholesterolemia, unspecified: Secondary | ICD-10-CM

## 2022-10-16 DIAGNOSIS — D631 Anemia in chronic kidney disease: Secondary | ICD-10-CM

## 2022-10-16 DIAGNOSIS — G8929 Other chronic pain: Secondary | ICD-10-CM | POA: Diagnosis not present

## 2022-10-16 DIAGNOSIS — Z8739 Personal history of other diseases of the musculoskeletal system and connective tissue: Secondary | ICD-10-CM | POA: Diagnosis not present

## 2022-10-16 DIAGNOSIS — N1832 Chronic kidney disease, stage 3b: Secondary | ICD-10-CM

## 2022-10-16 DIAGNOSIS — K219 Gastro-esophageal reflux disease without esophagitis: Secondary | ICD-10-CM

## 2022-10-16 DIAGNOSIS — K743 Primary biliary cirrhosis: Secondary | ICD-10-CM

## 2022-10-16 DIAGNOSIS — K754 Autoimmune hepatitis: Secondary | ICD-10-CM

## 2022-10-16 DIAGNOSIS — D329 Benign neoplasm of meninges, unspecified: Secondary | ICD-10-CM | POA: Diagnosis not present

## 2022-10-16 DIAGNOSIS — M810 Age-related osteoporosis without current pathological fracture: Secondary | ICD-10-CM

## 2022-10-16 DIAGNOSIS — I495 Sick sinus syndrome: Secondary | ICD-10-CM

## 2022-10-16 DIAGNOSIS — I272 Pulmonary hypertension, unspecified: Secondary | ICD-10-CM

## 2022-10-16 DIAGNOSIS — E039 Hypothyroidism, unspecified: Secondary | ICD-10-CM

## 2022-10-16 DIAGNOSIS — I25118 Atherosclerotic heart disease of native coronary artery with other forms of angina pectoris: Secondary | ICD-10-CM | POA: Diagnosis not present

## 2022-10-16 MED ORDER — TRAMADOL HCL 50 MG PO TABS: 50.0000 mg | ORAL_TABLET | Freq: Two times a day (BID) | ORAL | 0 refills | Status: DC | PRN

## 2022-10-16 MED ORDER — AMOXICILLIN 875 MG PO TABS: 875.0000 mg | ORAL_TABLET | Freq: Two times a day (BID) | ORAL | 0 refills | Status: AC

## 2022-10-16 NOTE — Progress Notes (Signed)
Subjective:    [name], female    DOB: 05-Feb-1929, 89 y.o.   MRN: [id number]  Patient here for  Chief Complaint  Patient presents with   Medical Management of Chronic Issues    HPI Here to follow up regarding CAD, chronic diastolic heart failure and hypertension. S/p pacemaker placement in July. Saw rheumatology - knee pain. Knee - some better. Due to f/u today. Takes gabapentin to help with neuropathy.  Request refill toradol to help her keep moving.  Only takes one per day at the most.  Needs to be able to get around her house.  Saw cardiology 09/03/22 - f/u afib.  Continues on metoprolol and diltiazem.  Also on xarelto.  Saw Dr Cathie Hoops - 09/23/22 - f/u anemia.  IV venofer recommended. She reports overall she feels breathing is stable.  Is having some increased sinus pressure and nasal congestion.  Symptoms started one week ago.  Feels similar to her previous sinus infections.  Discussed flonase and treatment.     Past Medical History:  Diagnosis Date   (HFpEF) heart failure with preserved ejection fraction (HCC)    a. 07/2017 Echo: EF 50-55%, no rwma, mild AS/AI, mod MR, mod dil LA, mod TR, PASP ; b. 03/2021 Echo: EF 60-65%. No rwma. Nl RV fxn. Sev BAE. Mild MR. Mild AI/AS.   Autoimmune hepatitis (HCC)    followed by Dr Marva Panda   Fibrocystic breast disease    Glaucoma    Hypercholesterolemia    Hypertension    Hypothyroidism    Inflammatory arthritis    MI (myocardial infarction) (HCC)    Mitral regurgitation    a. 07/2017 Echo: Mod MR; b. 08/2017 Cath: 4+/Severe MR; c. 08/2017 TEE: Mild MR; d. 03/2021 Echo: Mild MR.   Neuropathy    Non-obstructive Coronary artery disease    a. 05/2014 NSTEMI/Cath: LM nl, LAD mild dzs, LCX 60ost hazy (? culprit), RCA mild dzs. EF 60% by echo-->Med Rx; b. 08/2017 Cath: LM 30ost, LAD min irregs, LCX small, 40ost/p, RCA large, min irregs, RPDA/RPL1 nl, EF 50-55%. 4+MR.   Osteoarthritis    Permanent atrial fibrillation (HCC)    a.  CHA2DS2VASc = 6-->Xarelto.   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   Past Surgical History:  Procedure Laterality Date   ABDOMINAL HYSTERECTOMY  1963   partial, secondary to fibroids   APPENDECTOMY     Billroth     CARDIAC CATHETERIZATION  05/12/14   [facility]   CARDIOVERSION N/A 12/26/2016   Procedure: CARDIOVERSION;  Surgeon: Iran Ouch, MD;  Location: [facility] ORS;  Service: Cardiovascular;  Laterality: N/A;   CARDIOVERSION N/A 05/16/2017   Procedure: CARDIOVERSION;  Surgeon: Iran Ouch, MD;  Location: [facility] ORS;  Service: Cardiovascular;  Laterality: N/A;   CARDIOVERSION N/A 06/09/2017   Procedure: CARDIOVERSION;  Surgeon: Iran Ouch, MD;  Location: [facility] ORS;  Service: Cardiovascular;  Laterality: N/A;   CARDIOVERSION N/A 11/30/2018   Procedure: CARDIOVERSION;  Surgeon: Iran Ouch, MD;  Location: [facility] ORS;  Service: Cardiovascular;  Laterality: N/A;   CHOLECYSTECTOMY  1998   COLONOSCOPY  2009   PACEMAKER IMPLANT N/A 09/28/2021   Procedure: PACEMAKER IMPLANT;  Surgeon: Lanier Prude, MD;  Location: [facility] INVASIVE CV [facility];  Service: Cardiovascular;  Laterality: N/A;   RIGHT/LEFT HEART CATH AND CORONARY ANGIOGRAPHY Bilateral 08/21/2017   Procedure: RIGHT/LEFT HEART CATH AND CORONARY ANGIOGRAPHY;  Surgeon: Iran Ouch, MD;  Location:  [facility] INVASIVE CV LAB;  Service: Cardiovascular;  Laterality: Bilateral;   TEE WITHOUT CARDIOVERSION N/A 08/25/2017   Procedure: TRANSESOPHAGEAL ECHOCARDIOGRAM (TEE);  Surgeon: Dolores Patty, MD;  Location: [facility] ENDOSCOPY;  Service: Cardiovascular;  Laterality: N/A;   UPPER GI ENDOSCOPY  2004   Family History  Problem Relation Age of Onset   Stroke Mother    Esophageal cancer Brother        also had lung cancer   Ovarian cancer Daughter    Colon cancer Daughter    Breast cancer Neg Hx    Social History   Socioeconomic History   Marital status: Widowed    Spouse name: Not on  file   Number of children: 2   Years of education: Not on file   Highest education level: Not on file  Occupational History   Occupation: retired  Tobacco Use   Smoking status: Never   Smokeless tobacco: Never   Tobacco comments:    Never smoke 01/23/22  Vaping Use   Vaping status: Never Used  Substance and Sexual Activity   Alcohol use: No    Alcohol/week: 0.0 standard drinks of alcohol   Drug use: No   Sexual activity: Never  Other Topics Concern   Not on file  Social History Narrative   Not on file   Social Determinants of Health   Financial Resource Strain: Low Risk  (06/17/2022)   Overall Financial Resource Strain (CARDIA)    Difficulty of Paying Living Expenses: Not hard at all  Food Insecurity: No Food Insecurity (06/17/2022)   Hunger Vital Sign    Worried About Running Out of Food in the Last Year: Never true    Ran Out of Food in the Last Year: Never true  Transportation Needs: No Transportation Needs (06/17/2022)   PRAPARE - Administrator, Civil Service (Medical): No    Lack of Transportation (Non-Medical): No  Physical Activity: Insufficiently Active (06/17/2022)   Exercise Vital Sign    Days of Exercise per Week: 3 days    Minutes of Exercise per Session: 40 min  Stress: No Stress Concern Present (06/17/2022)   [employer] of Occupational Health - Occupational Stress Questionnaire    Feeling of Stress : Not at all  Social Connections: Unknown (06/17/2022)   Social Connection and Isolation Panel [NHANES]    Frequency of Communication with Friends and Family: More than three times a week    Frequency of Social Gatherings with Friends and Family: More than three times a week    Attends Religious Services: Not on Marketing executive or Organizations: Not on file    Attends Banker Meetings: Not on file    Marital Status: Not on file     Review of Systems  Constitutional:  Negative for appetite change and unexpected weight  change.  HENT:  Positive for congestion, postnasal drip and sinus pressure.   Respiratory:  Negative for cough and chest tightness.        Breathing stable.   Cardiovascular:  Negative for chest pain and palpitations.       No increased swelling.   Gastrointestinal:  Negative for abdominal pain, diarrhea, nausea and vomiting.  Genitourinary:  Negative for difficulty urinating and dysuria.  Musculoskeletal:  Negative for myalgias.       Knee pain as outlined.   Skin:  Negative for color change and rash.  Neurological:  Negative for dizziness and headaches.  Psychiatric/Behavioral:  Negative  for agitation and dysphoric mood.        Objective:     BP 128/74   Pulse (!) 105   Temp 97.9 F (36.6 C)   Resp 16   Ht 5\' 2"  (1.575 m)   Wt 137 lb 9.6 oz (62.4 kg)   SpO2 97%   BMI 25.17 kg/m  Wt Readings from Last 3 Encounters:  10/16/22 137 lb 9.6 oz (62.4 kg)  09/23/22 135 lb 1.6 oz (61.3 kg)  09/03/22 133 lb 12.8 oz (60.7 kg)    Physical Exam Vitals reviewed.  Constitutional:      General: She is not in acute distress.    Appearance: Normal appearance.  HENT:     Head: Normocephalic and atraumatic.     Right Ear: External ear normal.     Left Ear: External ear normal.  Eyes:     General: No scleral icterus.       Right eye: No discharge.        Left eye: No discharge.     Conjunctiva/sclera: Conjunctivae normal.  Neck:     Thyroid: No thyromegaly.  Cardiovascular:     Rate and Rhythm: Normal rate and regular rhythm.  Pulmonary:     Effort: No respiratory distress.     Breath sounds: Normal breath sounds. No wheezing.  Abdominal:     General: Bowel sounds are normal.     Palpations: Abdomen is soft.     Tenderness: There is no abdominal tenderness.  Musculoskeletal:        General: No tenderness.     Cervical back: Neck supple. No tenderness.     Comments: No increased swelling.   Lymphadenopathy:     Cervical: No cervical adenopathy.  Skin:    Findings: No  erythema or rash.  Neurological:     Mental Status: She is alert.  Psychiatric:        Mood and Affect: Mood normal.        Behavior: Behavior normal.      Outpatient Encounter Medications as of 10/16/2022  Medication Sig   amoxicillin (AMOXIL) 875 MG tablet Take 1 tablet (875 mg total) by mouth 2 (two) times daily for 10 days.   diltiazem (CARDIZEM CD) 240 MG 24 hr capsule Take 240 mg by mouth daily.   dorzolamide (TRUSOPT) 2 % ophthalmic solution Place 1 drop into the left eye 2 (two) times daily.   fluticasone (FLONASE) 50 MCG/ACT nasal spray Place 2 sprays into both nostrils daily as needed for allergies.   gabapentin (NEURONTIN) 300 MG capsule One capsule tid and two capsules q hs.   ipratropium (ATROVENT) 0.03 % nasal spray Place 2 sprays into both nostrils 3 (three) times daily as needed for rhinitis.   latanoprost (XALATAN) 0.005 % ophthalmic solution Place 1 drop into both eyes at bedtime.    levothyroxine (SYNTHROID) 125 MCG tablet Take 1 tablet (125 mcg total) by mouth daily.   metoprolol tartrate (LOPRESSOR) 100 MG tablet TAKE 1 AND 1/2 TABLETS(150 MG) BY MOUTH TWICE DAILY   Multiple Vitamin (MULTI-VITAMIN) tablet Take 1 tablet by mouth at bedtime.    Probiotic Product (ALIGN) 4 MG CAPS Take one capsule q day   torsemide (DEMADEX) 20 MG tablet Take 1 tablet (20 mg total) by mouth every other day.   traMADol (ULTRAM) 50 MG tablet Take 1 tablet (50 mg total) by mouth 2 (two) times daily as needed.   ursodiol (ACTIGALL) 300 MG capsule Take 300-600 mg by mouth  See admin instructions. Take 2 capsules (600 mg) daily in the morning   & 1 capsule (300 mg) at night.   XARELTO 15 MG TABS tablet TAKE 1 TABLET(15 MG) BY MOUTH DAILY WITH SUPPER   [DISCONTINUED] allopurinol (ZYLOPRIM) 100 MG tablet TAKE 1/2 TABLET(50 MG) BY MOUTH DAILY   [DISCONTINUED] hydroxychloroquine (PLAQUENIL) 200 MG tablet Take 200 mg by mouth every other day.   [DISCONTINUED] mometasone (ELOCON) 0.1 % cream Apply 1  Application topically daily. Appear pea sized ( or less) amount to external ear, and slightly in canal.   [DISCONTINUED] patiromer (VELTASSA) 8.4 g packet Take 1 packet (8.4 g total) by mouth daily.   [DISCONTINUED] traMADol (ULTRAM) 50 MG tablet TAKE 1 TABLET(50 MG) BY MOUTH TWICE DAILY AS NEEDED FOR PAIN   [DISCONTINUED] vitamin B-12 (CYANOCOBALAMIN) 500 MCG tablet Take 500 mcg by mouth every Monday.   No facility-administered encounter medications on file as of 10/16/2022.     Lab Results  Component Value Date   WBC 4.8 09/18/2022   HGB 11.9 (L) 09/18/2022   HCT 39.2 09/18/2022   PLT 221 09/18/2022   GLUCOSE 83 10/14/2022   CHOL 167 10/14/2022   TRIG 91.0 10/14/2022   HDL 66.40 10/14/2022   LDLDIRECT 143.5 12/17/2012   LDLCALC 82 10/14/2022   ALT 16 10/14/2022   AST 13 10/14/2022   NA 136 10/14/2022   K 4.5 10/14/2022   CL 103 10/14/2022   CREATININE 1.43 (H) 10/14/2022   BUN 40 (H) 10/14/2022   CO2 23 10/14/2022   TSH 3.41 04/02/2022   INR 1.63 12/29/2017   HGBA1C 5.6 01/27/2018    No results found.     Assessment & Plan:  Anemia in chronic kidney disease, unspecified CKD stage Assessment & Plan: Saw hematology 09/2022 - iron def.  IV iron.    Atherosclerotic heart disease of native coronary artery with other forms of angina pectoris [facility]) Assessment & Plan: Continue risk factor modification.  Continue metoprolol and xarelto. Overall stable.    Autoimmune hepatitis (HCC) Assessment & Plan: Follow liver function tests.  Followed by GI -  stable on ursodiol.  F/u abdominal ultrasound - Subtle nodularity along the free edge of the liver is stable,  consistent with the history of cirrhosis. No focal hepatic lesions  are present. Mild increased echogenicity of the liver, stable.   Benign meningioma [facility]) Assessment & Plan: Evaluated by Dr Dwyane Luo (oncology [facility]).  States was told no further w/up or f/u neded.  Of note, no mention on MRI 2018.    Chronic  diastolic heart failure (HCC) Assessment & Plan: Continue to weigh daily.  Breathing overall stable.  Has torsemide. Takes prn.    Cirrhosis of liver without ascites, unspecified hepatic cirrhosis type (HCC) Assessment & Plan: Stable on ursodiol.  F/u abdominal ultrasound - Subtle nodularity along the free edge of the liver is stable,  consistent with the history of cirrhosis. No focal hepatic lesions  are present. Mild increased echogenicity of the liver, stable.    Stage 3b chronic kidney disease (HCC) Assessment & Plan:  Avoid antiinflammatories.  Follow metabolic panel. Followed by nephrology.  Recently had issues with increased potassium.  Rx sent in for veltassa.  Not taking now. Recent potassium wnl.  Remain off.  Follow.    Tachy-brady syndrome [facility]) Assessment & Plan: S/p pacemaker placement 09/2021.    Severe mitral regurgitation Assessment & Plan: No evidence of volume overload.  Continue torsemide prn.    Pulmonary hypertension, unspecified (  HCC) Assessment & Plan: Followed by cardiology.    Primary biliary cholangitis (HCC) Assessment & Plan: Followed by GI. Stable. Follow liver function tests.     Persistent atrial fibrillation (HCC) Assessment & Plan: Followed by cardiology - reported occasional palpitations and will notice heart rate is up at times.  No chest pain or increased sob.  Recommended continuing current medication regimen. Overall stable.  Follow.    Other polyneuropathy Assessment & Plan: On gabapentin.    Osteoporosis without current pathological fracture, unspecified osteoporosis type Assessment & Plan: Has been seeing Dr Tedd Sias.  Prolia.     Left knee pain, unspecified chronicity Assessment & Plan: S/p gel injection.  Has f/u today with rheumatology.    Hypothyroidism, unspecified type Assessment & Plan: On thyroid replacement.  Follow tsh.     Hypercholesteremia Assessment & Plan: Unable to take statin medication.  Follow  lipid panel.    Gastroesophageal reflux disease, unspecified whether esophagitis present Assessment & Plan: Upper symptoms controlled.  On protonix.    Other orders -     traMADol HCl; Take 1 tablet (50 mg total) by mouth 2 (two) times daily as needed.  Dispense: 60 tablet; Refill: 0 -     Amoxicillin; Take 1 tablet (875 mg total) by mouth 2 (two) times daily for 10 days.  Dispense: 14 tablet; Refill: 0     D[place], MD

## 2022-10-18 NOTE — Progress Notes (Signed)
Remote pacemaker transmission.   

## 2022-10-20 ENCOUNTER — Encounter: Payer: Self-pay | Admitting: Internal Medicine

## 2022-10-20 NOTE — Assessment & Plan Note (Signed)
Continue risk factor modification.  Continue metoprolol and xarelto. Overall stable.

## 2022-10-20 NOTE — Assessment & Plan Note (Signed)
Upper symptoms controlled.  On protonix.  

## 2022-10-20 NOTE — Assessment & Plan Note (Signed)
S/p pacemaker placement 09/2021.

## 2022-10-20 NOTE — Assessment & Plan Note (Signed)
Followed by cardiology - reported occasional palpitations and will notice heart rate is up at times.  No chest pain or increased sob.  Recommended continuing current medication regimen. Overall stable.  Follow.

## 2022-10-20 NOTE — Assessment & Plan Note (Signed)
S/p gel injection.  Has f/u today with rheumatology.

## 2022-10-20 NOTE — Assessment & Plan Note (Signed)
Stable on ursodiol.  F/u abdominal ultrasound - Subtle nodularity along the free edge of the liver is stable,  consistent with the history of cirrhosis. No focal hepatic lesions  are present. Mild increased echogenicity of the liver, stable.

## 2022-10-20 NOTE — Assessment & Plan Note (Signed)
On gabapentin.  

## 2022-10-20 NOTE — Assessment & Plan Note (Signed)
Followed by cardiology 

## 2022-10-20 NOTE — Assessment & Plan Note (Signed)
Saw hematology 09/2022 - iron def.  IV iron.

## 2022-10-20 NOTE — Assessment & Plan Note (Signed)
Avoid antiinflammatories.  Follow metabolic panel. Followed by nephrology.  Recently had issues with increased potassium.  Rx sent in for veltassa.  Not taking now. Recent potassium wnl.  Remain off.  Follow.

## 2022-10-20 NOTE — Assessment & Plan Note (Signed)
Has been seeing Dr Gabriel Carina.  Prolia.

## 2022-10-20 NOTE — Assessment & Plan Note (Addendum)
Continue to weigh daily.  Breathing overall stable.  Has torsemide. Takes prn.

## 2022-10-20 NOTE — Assessment & Plan Note (Signed)
Follow liver function tests.  Followed by GI -  stable on ursodiol.  F/u abdominal ultrasound - Subtle nodularity along the free edge of the liver is stable,  consistent with the history of cirrhosis. No focal hepatic lesions  are present. Mild increased echogenicity of the liver, stable.

## 2022-10-20 NOTE — Assessment & Plan Note (Signed)
On thyroid replacement.  Follow tsh.  

## 2022-10-20 NOTE — Assessment & Plan Note (Signed)
No evidence of volume overload.  Continue torsemide prn.

## 2022-10-20 NOTE — Assessment & Plan Note (Signed)
Evaluated by Dr Allen Friedman (oncology [facility]).  States was told no further w/up or f/u neded.  Of note, no mention on MRI 2018.  

## 2022-10-20 NOTE — Assessment & Plan Note (Signed)
Followed by GI.  Stable.  Follow liver function tests.  

## 2022-10-20 NOTE — Assessment & Plan Note (Signed)
Unable to take statin medication.  Follow lipid panel.  

## 2022-10-21 ENCOUNTER — Encounter: Payer: Medicare Other | Admitting: Cardiology

## 2022-10-22 ENCOUNTER — Telehealth: Payer: Self-pay | Admitting: Internal Medicine

## 2022-10-22 DIAGNOSIS — B029 Zoster without complications: Secondary | ICD-10-CM

## 2022-10-22 NOTE — Telephone Encounter (Signed)
Pt called stating she would like medication for possible shingles. Pt stated this is the seventh time she has had it

## 2022-10-23 MED ORDER — VALACYCLOVIR HCL 500 MG PO TABS
500.0000 mg | ORAL_TABLET | Freq: Three times a day (TID) | ORAL | 0 refills | Status: AC
Start: 2022-10-23 — End: 2022-10-30

## 2022-10-23 NOTE — Telephone Encounter (Signed)
Called patient to triage because she thinks she has shingles. Has had them multiple times in the past and has been treated for them.  Rash right side of face Center part of her head (scalp) down her forehead, over her eye lid and goes up her temple.  Lesions are itching and she is having sharp pains every now and then.  No change in vision. No other acute symptoms. Has been scheduled with NP for acute visit in the AM    Patient uses [facility] [address] [address]

## 2022-10-23 NOTE — Telephone Encounter (Signed)
Patient is aware and start her medication today and keep appt tomorrow.

## 2022-10-23 NOTE — Telephone Encounter (Signed)
I HAVE SENT [facility] TO HER PHARMACY .  THIS WAS THE ONE SHE WAS PRESCRIBED LAST YEAR FOR SHINGLES.  THREE TIMES DAILY  TRY TO GET 2 DOSES IN TODAY  KEEP APPT TOMORROW WITH THE NP

## 2022-10-24 ENCOUNTER — Encounter: Payer: Self-pay | Admitting: Nurse Practitioner

## 2022-10-24 ENCOUNTER — Ambulatory Visit (INDEPENDENT_AMBULATORY_CARE_PROVIDER_SITE_OTHER): Payer: Medicare Other | Admitting: Nurse Practitioner

## 2022-10-24 ENCOUNTER — Telehealth: Payer: Self-pay

## 2022-10-24 VITALS — BP 128/86 | HR 86 | Temp 98.1°F | Ht 62.0 in | Wt 136.0 lb

## 2022-10-24 DIAGNOSIS — B029 Zoster without complications: Secondary | ICD-10-CM | POA: Diagnosis not present

## 2022-10-24 NOTE — Progress Notes (Signed)
Established Patient Office Visit  Subjective:  [name], female    DOB: Jul 28, 1928  Age: 89 y.o. MRN: [id number]  CC:  Chief Complaint  Patient presents with   Herpes Zoster    Possible shingles    HPI  Ruth Roth presents for rash around the left temple, on the left eye brown and forehead and the scalp. It stared on monday and have started taking valtrex yesterday.  The rashes are pruritic.   Denise any visual changes.  HPI   Past Medical History:  Diagnosis Date   (HFpEF) heart failure with preserved ejection fraction (HCC)    a. 07/2017 Echo: EF 50-55%, no rwma, mild AS/AI, mod MR, mod dil LA, mod TR, PASP ; b. 03/2021 Echo: EF 60-65%. No rwma. Nl RV fxn. Sev BAE. Mild MR. Mild AI/AS.   Autoimmune hepatitis (HCC)    followed by Dr Marva Panda   Fibrocystic breast disease    Glaucoma    Hypercholesterolemia    Hypertension    Hypothyroidism    Inflammatory arthritis    MI (myocardial infarction) (HCC)    Mitral regurgitation    a. 07/2017 Echo: Mod MR; b. 08/2017 Cath: 4+/Severe MR; c. 08/2017 TEE: Mild MR; d. 03/2021 Echo: Mild MR.   Neuropathy    Non-obstructive Coronary artery disease    a. 05/2014 NSTEMI/Cath: LM nl, LAD mild dzs, LCX 60ost hazy (? culprit), RCA mild dzs. EF 60% by echo-->Med Rx; b. 08/2017 Cath: LM 30ost, LAD min irregs, LCX small, 40ost/p, RCA large, min irregs, RPDA/RPL1 nl, EF 50-55%. 4+MR.   Osteoarthritis    Permanent atrial fibrillation (HCC)    a. CHA2DS2VASc = 6-->Xarelto.   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    Past Surgical History:  Procedure Laterality Date   ABDOMINAL HYSTERECTOMY  1963   partial, secondary to fibroids   APPENDECTOMY     Billroth     CARDIAC CATHETERIZATION  05/12/14   [facility]   CARDIOVERSION N/A 12/26/2016   Procedure: CARDIOVERSION;  Surgeon: Iran Ouch, MD;  Location: [facility] ORS;  Service: Cardiovascular;  Laterality: N/A;    CARDIOVERSION N/A 05/16/2017   Procedure: CARDIOVERSION;  Surgeon: Iran Ouch, MD;  Location: [facility] ORS;  Service: Cardiovascular;  Laterality: N/A;   CARDIOVERSION N/A 06/09/2017   Procedure: CARDIOVERSION;  Surgeon: Iran Ouch, MD;  Location: [facility] ORS;  Service: Cardiovascular;  Laterality: N/A;   CARDIOVERSION N/A 11/30/2018   Procedure: CARDIOVERSION;  Surgeon: Iran Ouch, MD;  Location: [facility] ORS;  Service: Cardiovascular;  Laterality: N/A;   CHOLECYSTECTOMY  1998   COLONOSCOPY  2009   PACEMAKER IMPLANT N/A 09/28/2021   Procedure: PACEMAKER IMPLANT;  Surgeon: Lanier Prude, MD;  Location: [facility];  Service: Cardiovascular;  Laterality: N/A;   RIGHT/LEFT HEART CATH AND CORONARY ANGIOGRAPHY Bilateral 08/21/2017   Procedure: RIGHT/LEFT HEART CATH AND CORONARY ANGIOGRAPHY;  Surgeon: Iran Ouch, MD;  Location: [facility] INVASIVE CV LAB;  Service: Cardiovascular;  Laterality: Bilateral;   TEE WITHOUT CARDIOVERSION N/A 08/25/2017   Procedure: TRANSESOPHAGEAL ECHOCARDIOGRAM (TEE);  Surgeon: Dolores Patty, MD;  Location: [facility] ENDOSCOPY;  Service: Cardiovascular;  Laterality: N/A;   UPPER GI ENDOSCOPY  2004    Family History  Problem Relation Age of Onset   Stroke Mother    Esophageal cancer Brother        also had lung cancer   Ovarian cancer Daughter  Colon cancer Daughter    Breast cancer Neg Hx     Social History   Socioeconomic History   Marital status: Widowed    Spouse name: Not on file   Number of children: 2   Years of education: Not on file   Highest education level: Not on file  Occupational History   Occupation: retired  Tobacco Use   Smoking status: Never   Smokeless tobacco: Never   Tobacco comments:    Never smoke 01/23/22  Vaping Use   Vaping status: Never Used  Substance and Sexual Activity   Alcohol use: No    Alcohol/week: 0.0 standard drinks of alcohol   Drug use: No   Sexual activity: Never  Other Topics Concern    Not on file  Social History Narrative   Not on file   Social Determinants of Health   Financial Resource Strain: Low Risk  (06/17/2022)   Overall Financial Resource Strain (CARDIA)    Difficulty of Paying Living Expenses: Not hard at all  Food Insecurity: No Food Insecurity (06/17/2022)   Hunger Vital Sign    Worried About Running Out of Food in the Last Year: Never true    Ran Out of Food in the Last Year: Never true  Transportation Needs: No Transportation Needs (06/17/2022)   PRAPARE - Administrator, Civil Service (Medical): No    Lack of Transportation (Non-Medical): No  Physical Activity: Insufficiently Active (06/17/2022)   Exercise Vital Sign    Days of Exercise per Week: 3 days    Minutes of Exercise per Session: 40 min  Stress: No Stress Concern Present (06/17/2022)   [employer] of Occupational Health - Occupational Stress Questionnaire    Feeling of Stress : Not at all  Social Connections: Unknown (06/17/2022)   Social Connection and Isolation Panel [NHANES]    Frequency of Communication with Friends and Family: More than three times a week    Frequency of Social Gatherings with Friends and Family: More than three times a week    Attends Religious Services: Not on file    Active Member of Clubs or Organizations: Not on file    Attends Banker Meetings: Not on file    Marital Status: Not on file  Intimate Partner Violence: Not At Risk (06/17/2022)   Humiliation, Afraid, Rape, and Kick questionnaire    Fear of Current or Ex-Partner: No    Emotionally Abused: No    Physically Abused: No    Sexually Abused: No     Outpatient Medications Prior to Visit  Medication Sig Dispense Refill   diltiazem (CARDIZEM CD) 240 MG 24 hr capsule Take 240 mg by mouth daily.     dorzolamide (TRUSOPT) 2 % ophthalmic solution Place 1 drop into the left eye 2 (two) times daily.     fluticasone (FLONASE) 50 MCG/ACT nasal spray Place 2 sprays into both nostrils daily as  needed for allergies.     gabapentin (NEURONTIN) 300 MG capsule One capsule tid and two capsules q hs. 150 capsule 2   ipratropium (ATROVENT) 0.03 % nasal spray Place 2 sprays into both nostrils 3 (three) times daily as needed for rhinitis.     latanoprost (XALATAN) 0.005 % ophthalmic solution Place 1 drop into both eyes at bedtime.      levothyroxine (SYNTHROID) 125 MCG tablet Take 1 tablet (125 mcg total) by mouth daily. 90 tablet 3   metoprolol tartrate (LOPRESSOR) 100 MG tablet TAKE 1 AND 1/2 TABLETS(150  MG) BY MOUTH TWICE DAILY 270 tablet 1   Multiple Vitamin (MULTI-VITAMIN) tablet Take 1 tablet by mouth at bedtime.      Probiotic Product (ALIGN) 4 MG CAPS Take one capsule q day 30 capsule 0   torsemide (DEMADEX) 20 MG tablet Take 1 tablet (20 mg total) by mouth every other day. 45 tablet 3   traMADol (ULTRAM) 50 MG tablet Take 1 tablet (50 mg total) by mouth 2 (two) times daily as needed. 60 tablet 0   ursodiol (ACTIGALL) 300 MG capsule Take 300-600 mg by mouth See admin instructions. Take 2 capsules (600 mg) daily in the morning   & 1 capsule (300 mg) at night.     valACYclovir (VALTREX) 500 MG tablet Take 1 tablet (500 mg total) by mouth every 8 (eight) hours for 7 days. 21 tablet 0   XARELTO 15 MG TABS tablet TAKE 1 TABLET(15 MG) BY MOUTH DAILY WITH SUPPER 90 tablet 1   amoxicillin (AMOXIL) 875 MG tablet Take 1 tablet (875 mg total) by mouth 2 (two) times daily for 10 days. (Patient not taking: Reported on 10/24/2022) 14 tablet 0   No facility-administered medications prior to visit.    Allergies  Allergen Reactions   Statins Other (See Comments)    Liver damage    Hydroxychloroquine Hives   Haldol [Haloperidol Lactate] Rash   Librax [Chlordiazepoxide-Clidinium] Rash   Plavix [Clopidogrel Bisulfate] Rash   Ramipril Rash    ROS Review of Systems Negative unless indicated in HPI.    Objective:    Physical Exam Constitutional:      Appearance: Normal appearance.  HENT:      Mouth/Throat:     Mouth: Mucous membranes are moist.  Eyes:     Conjunctiva/sclera: Conjunctivae normal.     Pupils: Pupils are equal, round, and reactive to light.  Cardiovascular:     Rate and Rhythm: Normal rate and regular rhythm.     Pulses: Normal pulses.     Heart sounds: Normal heart sounds.  Pulmonary:     Effort: Pulmonary effort is normal.     Breath sounds: Normal breath sounds.  Abdominal:     General: Bowel sounds are normal.     Palpations: Abdomen is soft.  Musculoskeletal:     Cervical back: Normal range of motion. No tenderness.  Skin:    General: Skin is warm.     Findings: No bruising.     Comments: Rash similar to shingles on the right eyebrow, temple and scalp and forehead.   Neurological:     General: No focal deficit present.     Mental Status: She is alert and oriented to person, place, and time. Mental status is at baseline.  Psychiatric:        Mood and Affect: Mood normal.        Behavior: Behavior normal.        Thought Content: Thought content normal.        Judgment: Judgment normal.     BP 128/86   Pulse 86   Temp 98.1 F (36.7 C) (Oral)   Ht 5\' 2"  (1.575 m)   Wt 136 lb (61.7 kg)   SpO2 96%   BMI 24.87 kg/m  Wt Readings from Last 3 Encounters:  10/24/22 136 lb (61.7 kg)  10/16/22 137 lb 9.6 oz (62.4 kg)  09/23/22 135 lb 1.6 oz (61.3 kg)     Health Maintenance  Topic Date Due   COVID-19 Vaccine (5 - 2023-24 season)  11/01/2022 (Originally 11/09/2021)   Zoster Vaccines- Shingrix (1 of 2) 11/13/2022 (Originally 05/28/1947)   INFLUENZA VACCINE  06/09/2023 (Originally 10/10/2022)   MAMMOGRAM  01/18/2023   Medicare Annual Wellness (AWV)  06/17/2023   Pneumonia Vaccine 67+ Years old  Completed   DEXA SCAN  Completed   HPV VACCINES  Aged Out   DTaP/Tdap/Td  Discontinued    There are no preventive care reminders to display for this patient.  Lab Results  Component Value Date   TSH 3.41 04/02/2022   Lab Results  Component Value  Date   WBC 4.8 09/18/2022   HGB 11.9 (L) 09/18/2022   HCT 39.2 09/18/2022   MCV 103.7 (H) 09/18/2022   PLT 221 09/18/2022   Lab Results  Component Value Date   NA 136 10/14/2022   K 4.5 10/14/2022   CO2 23 10/14/2022   GLUCOSE 83 10/14/2022   BUN 40 (H) 10/14/2022   CREATININE 1.43 (H) 10/14/2022   BILITOT 0.4 10/14/2022   ALKPHOS 100 10/14/2022   AST 13 10/14/2022   ALT 16 10/14/2022   PROT 6.9 10/14/2022   ALBUMIN 4.1 10/14/2022   CALCIUM 9.2 10/14/2022   ANIONGAP 11 09/18/2022   EGFR 47 (L) 10/26/2021   GFR 31.87 (L) 10/14/2022   Lab Results  Component Value Date   CHOL 167 10/14/2022   Lab Results  Component Value Date   HDL 66.40 10/14/2022   Lab Results  Component Value Date   LDLCALC 82 10/14/2022   Lab Results  Component Value Date   TRIG 91.0 10/14/2022   Lab Results  Component Value Date   CHOLHDL 3 10/14/2022   Lab Results  Component Value Date   HGBA1C 5.6 01/27/2018      Assessment & Plan:  Herpes zoster without complication Assessment & Plan: Continue valtrex.  Will schedule f/u with opthalmology  and inform patient.  Red flag discussed.  Patient was provided clear instructions to go to ER or urgent care if symptoms does not improve, red flag or new problem develops.  Patient verbalized understanding.      Follow-up: Return if symptoms worsen or fail to improve.   Kara Dies, NP

## 2022-10-24 NOTE — Patient Instructions (Signed)
Continue the valtrex. We will call the ophthalmology to schedule an appointment.

## 2022-10-24 NOTE — Telephone Encounter (Signed)
Pt called back and I read the message to her and she verbalized understanding 

## 2022-10-24 NOTE — Telephone Encounter (Signed)
Called [facility] and LVM for them to call back to schedule pt appt for shingles over her right eye pt. needs to be evaluated My name and phone number was given

## 2022-10-24 NOTE — Telephone Encounter (Signed)
Noted  

## 2022-10-24 NOTE — Telephone Encounter (Signed)
Spoke to Receptionist @  eye center and they scheduled pt for appt on 10/25/22 @ 8:30 am . Pt instructed me to Lvm on pt phone  to inform her of appt

## 2022-10-24 NOTE — Progress Notes (Signed)
appt

## 2022-10-24 NOTE — Assessment & Plan Note (Signed)
Continue valtrex.  Will schedule f/u with opthalmology  and inform patient.  Red flag discussed.  Patient was provided clear instructions to go to ER or urgent care if symptoms does not improve, red flag or new problem develops.  Patient verbalized understanding.

## 2022-10-25 DIAGNOSIS — B0230 Zoster ocular disease, unspecified: Secondary | ICD-10-CM | POA: Diagnosis not present

## 2022-11-04 DIAGNOSIS — R600 Localized edema: Secondary | ICD-10-CM | POA: Diagnosis not present

## 2022-11-04 DIAGNOSIS — N2581 Secondary hyperparathyroidism of renal origin: Secondary | ICD-10-CM | POA: Diagnosis not present

## 2022-11-04 DIAGNOSIS — N1832 Chronic kidney disease, stage 3b: Secondary | ICD-10-CM | POA: Diagnosis not present

## 2022-11-04 DIAGNOSIS — I10 Essential (primary) hypertension: Secondary | ICD-10-CM | POA: Diagnosis not present

## 2022-11-05 DIAGNOSIS — N1831 Chronic kidney disease, stage 3a: Secondary | ICD-10-CM | POA: Diagnosis not present

## 2022-11-05 DIAGNOSIS — M810 Age-related osteoporosis without current pathological fracture: Secondary | ICD-10-CM | POA: Diagnosis not present

## 2022-11-18 ENCOUNTER — Ambulatory Visit: Payer: Medicare Other | Attending: Cardiology | Admitting: Cardiology

## 2022-11-18 VITALS — BP 132/78 | HR 100

## 2022-11-18 DIAGNOSIS — I5032 Chronic diastolic (congestive) heart failure: Secondary | ICD-10-CM | POA: Diagnosis not present

## 2022-11-18 MED ORDER — TORSEMIDE 20 MG PO TABS: 20.0000 mg | ORAL_TABLET | Freq: Every day | ORAL | 3 refills | Status: AC

## 2022-11-18 NOTE — Progress Notes (Signed)
Advanced Heart Failure Clinic Note   Date:  11/18/2022   ID:  RAPHAEL COPPIN, DOB 03-Aug-1928, MRN [id number]  Location: Home  Provider location: [facility] Type of Visit: Established patient  PCP:  Dale Wynnewood, MD  Cardiologist:  Lorine Bears, MD Primary HF: Bensimhon Nephrology: Dr. Cherylann Ratel  Chief Complaint: Heart Failure follow-up   History of Present Illness: ALIANY GEARHART is a 89 y.o. female with a history of CAD, chronic diastolic HF with severe mitral reguritationm, chronic atrial fibrillation, hypertension, peptic ulcer disease.   Admitted [facility] 08/21/17 s/p R/LHC which showed significantly elevated filling pressures, severe MR, and low cardiac output, and then transferred to [facility] 6/14 for consideration of MitraClip.  AKI noted on admission felt to be due to cardiorenal syndrome. She required milrinone and lasix for diuresis. Diuresed 25 lbs and transitioned to 20 mg torsemide daily. Case and images reviewed at length with structural heart team. MR felt to be truly functional with improvement from hemodynamic optimization. Not felt to be MitraClip candidate. Discharge weight: 121 lbs.    In 2020 had recurrent AF with RVR Amio 200 bid started. Underwent DC-CV on 9/210/20. PCP cut back torsemide to 20 qOD due to AKI but developed volume overload. Torsemide increased back to 20 daily. Developed recurrent AF and Amio stopped. Saw Dr. Kirke Corin in 04/30/19 and metoprolol increased to 75 bid for better rate control. Says she felt better on the 50 but is tolerating this ok.  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   In 1/24 was seen in office and was in rapid AFL 120s (usually in rate controlled AF). Failed pace-termination efforts. Zio placed - Zio AF/AFL occurred continuously (100% burden), ranging from 68-159 bpm (avg of 80 bpm).   Echo 1/24 EF 60-65% mild MR  Here for f/u of CHF.  She feels palpitations and heart rate sometimes bumps up to  120s but generally 80s-100s.  She is short of breath with sweeping, vacuuming, washing her windows.  She lives alone and is still able to do these activities, just has to rest a lot.  No dyspnea walking on flat ground though she has some limitation from knee arthritis.  No chest pain, no lightheadedness, no BRBPR/melena.    Labs (8/24): K 5.1, creatinine 1.11  [facility] 08/21/2017 - Mild non-obstructive CAD RHC Procedural Findings: (Obtained from Scan 08/21/17 at 1030) Hemodynamics (mmHg) RA mean 17 RV 68/11 (19) PA 62/27 (44) PCWP 41  AO 147/67 (102) Cardiac Output (Fick) 2.45 Cardiac Index  (Fick) 1.46 PVR 1.23 WU     Past Medical History:  Diagnosis Date   (HFpEF) heart failure with preserved ejection fraction (HCC)    a. 07/2017 Echo: EF 50-55%, no rwma, mild AS/AI, mod MR, mod dil LA, mod TR, PASP ; b. 03/2021 Echo: EF 60-65%. No rwma. Nl RV fxn. Sev BAE. Mild MR. Mild AI/AS.   Autoimmune hepatitis (HCC)    followed by Dr Marva Panda   Fibrocystic breast disease    Glaucoma    Hypercholesterolemia    Hypertension    Hypothyroidism    Inflammatory arthritis    MI (myocardial infarction) (HCC)    Mitral regurgitation    a. 07/2017 Echo: Mod MR; b. 08/2017 Cath: 4+/Severe MR; c. 08/2017 TEE: Mild MR; d. 03/2021 Echo: Mild MR.   Neuropathy    Non-obstructive Coronary artery disease    a. 05/2014 NSTEMI/Cath: LM nl, LAD mild dzs, LCX 60ost hazy (? culprit), RCA  mild dzs. EF 60% by echo-->Med Rx; b. 08/2017 Cath: LM 30ost, LAD min irregs, LCX small, 40ost/p, RCA large, min irregs, RPDA/RPL1 nl, EF 50-55%. 4+MR.   Osteoarthritis    Permanent atrial fibrillation (HCC)    a. CHA2DS2VASc = 6-->Xarelto.   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   Past Surgical History:  Procedure Laterality Date   ABDOMINAL HYSTERECTOMY  1963   partial, secondary to fibroids   APPENDECTOMY     [facility]     CARDIAC CATHETERIZATION  05/12/14   [facility]    CARDIOVERSION N/A 12/26/2016   Procedure: CARDIOVERSION;  Surgeon: Iran Ouch, MD;  Location: [facility] ORS;  Service: Cardiovascular;  Laterality: N/A;   CARDIOVERSION N/A 05/16/2017   Procedure: CARDIOVERSION;  Surgeon: Iran Ouch, MD;  Location: [facility] ORS;  Service: Cardiovascular;  Laterality: N/A;   CARDIOVERSION N/A 06/09/2017   Procedure: CARDIOVERSION;  Surgeon: Iran Ouch, MD;  Location: [facility] ORS;  Service: Cardiovascular;  Laterality: N/A;   CARDIOVERSION N/A 11/30/2018   Procedure: CARDIOVERSION;  Surgeon: Iran Ouch, MD;  Location: [facility] ORS;  Service: Cardiovascular;  Laterality: N/A;   CHOLECYSTECTOMY  1998   COLONOSCOPY  2009   PACEMAKER IMPLANT N/A 09/28/2021   Procedure: PACEMAKER IMPLANT;  Surgeon: Lanier Prude, MD;  Location: [facility] CV LAB;  Service: Cardiovascular;  Laterality: N/A;   RIGHT/LEFT HEART CATH AND CORONARY ANGIOGRAPHY Bilateral 08/21/2017   Procedure: RIGHT/LEFT HEART CATH AND CORONARY ANGIOGRAPHY;  Surgeon: Iran Ouch, MD;  Location: [facility] INVASIVE CV LAB;  Service: Cardiovascular;  Laterality: Bilateral;   TEE WITHOUT CARDIOVERSION N/A 08/25/2017   Procedure: TRANSESOPHAGEAL ECHOCARDIOGRAM (TEE);  Surgeon: Dolores Patty, MD;  Location: [facility] ENDOSCOPY;  Service: Cardiovascular;  Laterality: N/A;   UPPER GI ENDOSCOPY  2004     Current Outpatient Medications  Medication Sig Dispense Refill   diltiazem (CARDIZEM CD) 240 MG 24 hr capsule Take 240 mg by mouth daily.     dorzolamide (TRUSOPT) 2 % ophthalmic solution Place 1 drop into the left eye 2 (two) times daily.     fluticasone (FLONASE) 50 MCG/ACT nasal spray Place 2 sprays into both nostrils daily as needed for allergies.     gabapentin (NEURONTIN) 300 MG capsule One capsule tid and two capsules q hs. 150 capsule 2   ipratropium (ATROVENT) 0.03 % nasal spray Place 2 sprays into both nostrils 3 (three) times daily as needed for rhinitis.     latanoprost (XALATAN) 0.005 %  ophthalmic solution Place 1 drop into both eyes at bedtime.      levothyroxine (SYNTHROID) 125 MCG tablet Take 1 tablet (125 mcg total) by mouth daily. 90 tablet 3   metoprolol tartrate (LOPRESSOR) 100 MG tablet TAKE 1 AND 1/2 TABLETS(150 MG) BY MOUTH TWICE DAILY 270 tablet 1   Multiple Vitamin (MULTI-VITAMIN) tablet Take 1 tablet by mouth at bedtime.      Probiotic Product (ALIGN) 4 MG CAPS Take one capsule q day 30 capsule 0   torsemide (DEMADEX) 20 MG tablet Take 1 tablet (20 mg total) by mouth daily. 90 tablet 3   traMADol (ULTRAM) 50 MG tablet Take 1 tablet (50 mg total) by mouth 2 (two) times daily as needed. 60 tablet 0   ursodiol (ACTIGALL) 300 MG capsule Take 300-600 mg by mouth See admin instructions. Take 2 capsules (600 mg) daily in the morning   & 1 capsule (300 mg) at night.  XARELTO 15 MG TABS tablet TAKE 1 TABLET(15 MG) BY MOUTH DAILY WITH SUPPER 90 tablet 1   No current facility-administered medications for this visit.    Allergies:   Statins, Hydroxychloroquine, Haldol [haloperidol lactate], Librax [chlordiazepoxide-clidinium], Plavix [clopidogrel bisulfate], and Ramipril   Social History:  The patient  reports that she has never smoked. She has never used smokeless tobacco. She reports that she does not drink alcohol and does not use drugs.   Family History:  The patient's family history includes Colon cancer in her daughter; Esophageal cancer in her brother; Ovarian cancer in her daughter; Stroke in her mother.   ROS:  Please see the history of present illness.   All other systems are personally reviewed and negative.   Vitals:   11/18/22 1513  BP: 132/78  Pulse: 100  SpO2: 98%    Exam:   General: NAD Neck: JVP 9-10 cm, no thyromegaly or thyroid nodule.  Lungs: Clear to auscultation bilaterally with normal respiratory effort. CV: Nondisplaced PMI.  Heart irregular S1/S2, no S3/S4, no murmur.  1+ ankle edema.  No carotid bruit.  Normal pedal pulses.  Abdomen:  Soft, nontender, no hepatosplenomegaly, no distention.  Skin: Intact without lesions or rashes.  Neurologic: Alert and oriented x 3.  Psych: Normal affect. Extremities: No clubbing or cyanosis.  HEENT: Normal.   Recent Labs: 04/02/2022: TSH 3.41 04/04/2022: B Natriuretic Peptide 394.0 09/18/2022: Hemoglobin 11.9; Platelets 221 10/14/2022: ALT 16; BUN 40; Creatinine, Ser 1.43; Potassium 4.5; Sodium 136  Personally reviewed   Wt Readings from Last 3 Encounters:  10/24/22 136 lb (61.7 kg)  10/16/22 137 lb 9.6 oz (62.4 kg)  09/23/22 135 lb 1.6 oz (61.3 kg)     ASSESSMENT AND PLAN: 1.  Chronic diastolic congestive heart failure - Echo 08/01/17 LVEF 50-55%, Mild AS, Mod MR, Mod LAE, Mild RAE, Mod TR, RV moderately HK PA peak pressure 65 mm Hg. - Cath 08/21/17 with signficantly elevated filling pressures, severe MR, and low cardiac output. Had cardiorenal syndrome on admit with severe MR and RV failure and required milrinone - Echo 10/09/17: EF 55-60%, mild to mod MR - Echo 12/20 EF 60-65% mod MR  - Echo 12/21 EF 55-60% Moderate RV HK.  Moderate MR  - Echo 03/16/21 EF 60-65% RV normal. Mild MR/AI Personally reviewed - Echo 1/24 EF 60-65%, RV normal, LA/RA severely dilated, mild MR, mild-mod TR - NYHA class II, some volume overload on exam.  PCP decreased torsemide to 20 mg every other day.  In the past, she was taking 40 daily alternating with 20 daily.  I will increase her back to torsemide 20 mg daily.  BMET/BNP today, BMET in 10 days.  - Have not used SGLT2i with age and risk of UTI  2.  Severe mitral regurgitation:  - Echo 08/01/17 LVEF 50-55%, Mild AS, Mod MR, Mod LAE, Mild RAE, Mod TR, PA peak pressure 65 mm Hg. MR ++ calcified, and RV mild/moderately reduced function.  - TEE 6/17 showed only mild MR with restricted posterior leaflet.  -Echo 10/09/17: EF 55-60%, mild to mod MR  - Echo 12/20 EF 60-65% mod MR  - Echo 03/16/21 EF 60-65% RV normal. Mild MR/AI  - Echo 1/24 EF 60-65%, RV normal, mild  MR - Likely functional MR and MR has improved with hemodynamic optimization.  MR now mild.  No significant systolic murmur.    3.  Chronic atrial fibrillation with tachy/brady syndrome / Atrial Flutter  - Has previously failed amiodarone and DCCV  x3.  - Continue Xarelto. No bleeding. CBC today - Zio 9/21 Chronic AF. Mean HR 90.  - Now s/p PPM in 7/23. Device unable to fully be interrogated today.  - Currently on 240 diltiazem and 150 metoprolol BID, continue.  - Had episode of rapid AFL 1/24 (usually has chronic AF) - Zio AF/AFL occurred continuously (100% burden), ranging from 68-159 bpm (avg of 80 bpm).  - HR overall well controlled but still with bursts of RVR  4.  Essential hypertension:  - Blood pressure well controlled. Continue current regimen.  5. CKD 3 with h/o AKI - Recent labs with Scr 1.11 - labs today  Followup with Dr. Gala Romney or Clarisa Kindred in 1 month.   Marca Ancona, MD  4:08 PM   [facility] [address] Heart [facility] [place] [address] [phone] (office) [phone] (fax)

## 2022-11-18 NOTE — Patient Instructions (Signed)
Medication Changes:  Increase Torsemide to 20 mg (1 tablet) daily.   Lab Work:  Labs done today, your results will be available in MyChart, we will contact you for abnormal readings.  Special Instructions // Education:  Do the following things EVERYDAY: Weigh yourself in the morning before breakfast. Write it down and keep it in a log. Take your medicines as prescribed Eat low salt foods--Limit salt (sodium) to 2000 mg per day.  Stay as active as you can everyday Limit all fluids for the day to less than 2 liters   Follow-Up in: follow up in 1 month with Dr. Gala Romney.     If you have any questions or concerns before your next appointment please send [place] a message through [place] or call our office at [phone] Monday-Friday 8 am-5 pm.   If you have an urgent need after hours on the weekend please call your Primary Cardiologist or the [facility] Clinic in [place] at [phone].

## 2022-11-19 ENCOUNTER — Telehealth: Payer: Self-pay

## 2022-11-19 DIAGNOSIS — I5032 Chronic diastolic (congestive) heart failure: Secondary | ICD-10-CM

## 2022-11-19 LAB — CBC
Hematocrit: 36.9 % (ref 34.0–46.6)
Hemoglobin: 12 g/dL (ref 11.1–15.9)
MCH: 32.2 pg (ref 26.6–33.0)
MCHC: 32.5 g/dL (ref 31.5–35.7)
MCV: 99 fL — ABNORMAL HIGH (ref 79–97)
Platelets: 245 10*3/uL (ref 150–450)
RBC: 3.73 x10E6/uL — ABNORMAL LOW (ref 3.77–5.28)
RDW: 13.6 % (ref 11.7–15.4)
WBC: 5.3 10*3/uL (ref 3.4–10.8)

## 2022-11-19 LAB — BASIC METABOLIC PANEL
BUN/Creatinine Ratio: 29 — ABNORMAL HIGH (ref 12–28)
BUN: 34 mg/dL (ref 10–36)
CO2: 20 mmol/L (ref 20–29)
Calcium: 8.8 mg/dL (ref 8.7–10.3)
Chloride: 105 mmol/L (ref 96–106)
Creatinine, Ser: 1.19 mg/dL — ABNORMAL HIGH (ref 0.57–1.00)
Glucose: 78 mg/dL (ref 70–99)
Potassium: 5.6 mmol/L — ABNORMAL HIGH (ref 3.5–5.2)
Sodium: 139 mmol/L (ref 134–144)
eGFR: 43 mL/min/{1.73_m2} — ABNORMAL LOW (ref >59)

## 2022-11-19 LAB — BRAIN NATRIURETIC PEPTIDE: BNP: 634.6 pg/mL — ABNORMAL HIGH (ref 0.0–100.0)

## 2022-11-19 NOTE — Telephone Encounter (Signed)
-----   Message from Marca Ancona sent at 11/19/2022  4:24 PM EDT ----- K is high.  Low K diet.  Make sure no K supplement.  I increased her torsemide which should help.  She needs BMET early next week.

## 2022-11-20 DIAGNOSIS — B0230 Zoster ocular disease, unspecified: Secondary | ICD-10-CM | POA: Diagnosis not present

## 2022-11-20 DIAGNOSIS — H401132 Primary open-angle glaucoma, bilateral, moderate stage: Secondary | ICD-10-CM | POA: Diagnosis not present

## 2022-11-20 NOTE — Telephone Encounter (Signed)
-----   Message from Marca Ancona sent at 11/19/2022  4:24 PM EDT ----- K is high.  Low K diet.  Make sure no K supplement.  I increased her torsemide which should help.  She needs BMET early next week.

## 2022-11-21 DIAGNOSIS — E039 Hypothyroidism, unspecified: Secondary | ICD-10-CM | POA: Diagnosis not present

## 2022-11-22 ENCOUNTER — Other Ambulatory Visit: Payer: Self-pay | Admitting: Cardiovascular Disease

## 2022-11-27 ENCOUNTER — Other Ambulatory Visit
Admission: RE | Admit: 2022-11-27 | Discharge: 2022-11-27 | Disposition: A | Discharge status: Home / Self Care | Payer: Medicare Other | Attending: Cardiology | Admitting: Cardiology

## 2022-11-27 DIAGNOSIS — I5032 Chronic diastolic (congestive) heart failure: Secondary | ICD-10-CM | POA: Diagnosis not present

## 2022-11-27 LAB — BASIC METABOLIC PANEL WITH GFR
Anion gap: 7 (ref 5–15)
BUN: 40 mg/dL — ABNORMAL HIGH (ref 8–23)
CO2: 19 mmol/L — ABNORMAL LOW (ref 22–32)
Calcium: 8.1 mg/dL — ABNORMAL LOW (ref 8.9–10.3)
Chloride: 110 mmol/L (ref 98–111)
Creatinine, Ser: 1.31 mg/dL — ABNORMAL HIGH (ref 0.44–1.00)
GFR, Estimated: 38 mL/min — ABNORMAL LOW (ref >60)
Glucose, Bld: 65 mg/dL — ABNORMAL LOW (ref 70–99)
Potassium: 4.7 mmol/L (ref 3.5–5.1)
Sodium: 136 mmol/L (ref 135–145)

## 2022-12-04 ENCOUNTER — Ambulatory Visit
Admission: RE | Admit: 2022-12-04 | Discharge: 2022-12-04 | Disposition: A | Discharge status: Home / Self Care | Payer: Medicare Other | Attending: Internal Medicine | Admitting: Internal Medicine

## 2022-12-04 ENCOUNTER — Ambulatory Visit: Payer: Medicare Other | Admitting: Internal Medicine

## 2022-12-04 ENCOUNTER — Encounter: Payer: Self-pay | Admitting: Internal Medicine

## 2022-12-04 ENCOUNTER — Ambulatory Visit
Admission: RE | Admit: 2022-12-04 | Discharge: 2022-12-04 | Disposition: A | Discharge status: Home / Self Care | Payer: Medicare Other | Source: Ambulatory Visit | Attending: Internal Medicine | Admitting: Internal Medicine

## 2022-12-04 ENCOUNTER — Other Ambulatory Visit
Admission: RE | Admit: 2022-12-04 | Discharge: 2022-12-04 | Disposition: A | Discharge status: Home / Self Care | Payer: Medicare Other | Source: Home / Self Care | Attending: Internal Medicine | Admitting: Internal Medicine

## 2022-12-04 VITALS — BP 146/88 | HR 111 | Temp 97.9°F | Resp 16 | Ht 62.0 in | Wt 138.0 lb

## 2022-12-04 DIAGNOSIS — I10 Essential (primary) hypertension: Secondary | ICD-10-CM | POA: Diagnosis not present

## 2022-12-04 DIAGNOSIS — M810 Age-related osteoporosis without current pathological fracture: Secondary | ICD-10-CM

## 2022-12-04 DIAGNOSIS — R059 Cough, unspecified: Secondary | ICD-10-CM

## 2022-12-04 DIAGNOSIS — G6289 Other specified polyneuropathies: Secondary | ICD-10-CM | POA: Diagnosis not present

## 2022-12-04 DIAGNOSIS — D6869 Other thrombophilia: Secondary | ICD-10-CM | POA: Diagnosis not present

## 2022-12-04 DIAGNOSIS — I5032 Chronic diastolic (congestive) heart failure: Secondary | ICD-10-CM

## 2022-12-04 DIAGNOSIS — E039 Hypothyroidism, unspecified: Secondary | ICD-10-CM | POA: Diagnosis not present

## 2022-12-04 DIAGNOSIS — N189 Chronic kidney disease, unspecified: Secondary | ICD-10-CM

## 2022-12-04 DIAGNOSIS — I5031 Acute diastolic (congestive) heart failure: Secondary | ICD-10-CM

## 2022-12-04 DIAGNOSIS — K754 Autoimmune hepatitis: Secondary | ICD-10-CM

## 2022-12-04 DIAGNOSIS — K219 Gastro-esophageal reflux disease without esophagitis: Secondary | ICD-10-CM | POA: Diagnosis not present

## 2022-12-04 DIAGNOSIS — I25118 Atherosclerotic heart disease of native coronary artery with other forms of angina pectoris: Secondary | ICD-10-CM | POA: Diagnosis not present

## 2022-12-04 DIAGNOSIS — Z1231 Encounter for screening mammogram for malignant neoplasm of breast: Secondary | ICD-10-CM

## 2022-12-04 DIAGNOSIS — D329 Benign neoplasm of meninges, unspecified: Secondary | ICD-10-CM

## 2022-12-04 DIAGNOSIS — N1832 Chronic kidney disease, stage 3b: Secondary | ICD-10-CM | POA: Diagnosis not present

## 2022-12-04 DIAGNOSIS — K7460 Unspecified cirrhosis of liver: Secondary | ICD-10-CM

## 2022-12-04 DIAGNOSIS — I517 Cardiomegaly: Secondary | ICD-10-CM | POA: Diagnosis not present

## 2022-12-04 DIAGNOSIS — R0989 Other specified symptoms and signs involving the circulatory and respiratory systems: Secondary | ICD-10-CM | POA: Diagnosis not present

## 2022-12-04 DIAGNOSIS — I272 Pulmonary hypertension, unspecified: Secondary | ICD-10-CM

## 2022-12-04 DIAGNOSIS — I4819 Other persistent atrial fibrillation: Secondary | ICD-10-CM

## 2022-12-04 DIAGNOSIS — K743 Primary biliary cirrhosis: Secondary | ICD-10-CM

## 2022-12-04 DIAGNOSIS — Z950 Presence of cardiac pacemaker: Secondary | ICD-10-CM | POA: Diagnosis not present

## 2022-12-04 LAB — CBC WITH DIFFERENTIAL/PLATELET
Abs Immature Granulocytes: 0.02 10*3/uL (ref 0.00–0.07)
Basophils Absolute: 0 10*3/uL (ref 0.0–0.1)
Basophils Relative: 1 %
Eosinophils Absolute: 0.2 10*3/uL (ref 0.0–0.5)
Eosinophils Relative: 3 %
HCT: 40.6 % (ref 36.0–46.0)
Hemoglobin: 12.3 g/dL (ref 12.0–15.0)
Immature Granulocytes: 0 %
Lymphocytes Relative: 11 %
Lymphs Abs: 0.6 10*3/uL — ABNORMAL LOW (ref 0.7–4.0)
MCH: 32.1 pg (ref 26.0–34.0)
MCHC: 30.3 g/dL (ref 30.0–36.0)
MCV: 106 fL — ABNORMAL HIGH (ref 80.0–100.0)
Monocytes Absolute: 0.8 10*3/uL (ref 0.1–1.0)
Monocytes Relative: 15 %
Neutro Abs: 3.8 10*3/uL (ref 1.7–7.7)
Neutrophils Relative %: 70 %
Platelets: 239 10*3/uL (ref 150–400)
RBC: 3.83 MIL/uL — ABNORMAL LOW (ref 3.87–5.11)
RDW: 16.1 % — ABNORMAL HIGH (ref 11.5–15.5)
WBC: 5.5 10*3/uL (ref 4.0–10.5)
nRBC: 0 % (ref 0.0–0.2)

## 2022-12-04 LAB — COMPREHENSIVE METABOLIC PANEL
ALT: 18 U/L (ref 0–44)
AST: 15 U/L (ref 15–41)
Albumin: 3.8 g/dL (ref 3.5–5.0)
Alkaline Phosphatase: 110 U/L (ref 38–126)
Anion gap: 9 (ref 5–15)
BUN: 34 mg/dL — ABNORMAL HIGH (ref 8–23)
CO2: 20 mmol/L — ABNORMAL LOW (ref 22–32)
Calcium: 8.4 mg/dL — ABNORMAL LOW (ref 8.9–10.3)
Chloride: 109 mmol/L (ref 98–111)
Creatinine, Ser: 1.07 mg/dL — ABNORMAL HIGH (ref 0.44–1.00)
GFR, Estimated: 49 mL/min — ABNORMAL LOW (ref >60)
Glucose, Bld: 49 mg/dL — ABNORMAL LOW (ref 70–99)
Potassium: 4.5 mmol/L (ref 3.5–5.1)
Sodium: 138 mmol/L (ref 135–145)
Total Bilirubin: 0.6 mg/dL (ref 0.3–1.2)
Total Protein: 6.7 g/dL (ref 6.5–8.1)

## 2022-12-04 MED ORDER — CEFDINIR 300 MG PO CAPS: 300.0000 mg | ORAL_CAPSULE | Freq: Two times a day (BID) | ORAL | 0 refills | Status: DC

## 2022-12-04 MED ORDER — LEVOTHYROXINE SODIUM 125 MCG PO TABS: 125.0000 ug | ORAL_TABLET | Freq: Every day | ORAL | 3 refills | Status: DC

## 2022-12-04 NOTE — Patient Instructions (Signed)
Saline nasal spray - flush nose 1-2 x/day  Nasacort nasal spray - 2 sprays each nostril one time per day.  Do this in the evening.   Robitussin DM for cough and congestion.

## 2022-12-04 NOTE — Progress Notes (Unsigned)
Subjective:    [name], female    DOB: 12/04/1928, 89 y.o.   MRN: [id number]  Patient here for  Chief Complaint  Patient presents with   Medical Management of Chronic Issues    HPI Here to follow up regarding CAD, chronic diastolic heart failure and hypertension.she is accompanied by a friend.  S/p pacemaker placement in July. Saw rheumatology - knee pain. Saw cardiology 11/18/22 - f/u afib. Continues on metoprolol and diltiazem. Also on xarelto. Noticed some volume overload on exam.  Toresemide adjusted 20mg  q day.  She is taking dily now. Saw Dr Cathie Hoops - 09/23/22 - f/u anemia. IV venofer recommended. Had f/u with nephrology 11/04/22.  Stable. Seeing Dr Tedd Sias - last 11/05/22 - prolia. Reports that starting 6 days ago - noticed increased congestion and cough. Increased sweats. States she returned to her home after going out and was sweating.  Reports she sat down in front of the air conditioner .  After that, she noticed some increased sinus pressure and increased drainage.  Previous sore throat.  Not sore now. Some wheezing - intermittently.  Increased cough.  Yellow mucus production.  No body aches.  No vomiting or diarrhea.  She is eating.  Has taken coricidin. Feels breathing is stable.    Past Medical History:  Diagnosis Date   (HFpEF) heart failure with preserved ejection fraction (HCC)    a. 07/2017 Echo: EF 50-55%, no rwma, mild AS/AI, mod MR, mod dil LA, mod TR, PASP ; b. 03/2021 Echo: EF 60-65%. No rwma. Nl RV fxn. Sev BAE. Mild MR. Mild AI/AS.   Autoimmune hepatitis (HCC)    followed by Dr Marva Panda   Fibrocystic breast disease    Glaucoma    Hypercholesterolemia    Hypertension    Hypothyroidism    Inflammatory arthritis    MI (myocardial infarction) (HCC)    Mitral regurgitation    a. 07/2017 Echo: Mod MR; b. 08/2017 Cath: 4+/Severe MR; c. 08/2017 TEE: Mild MR; d. 03/2021 Echo: Mild MR.   Neuropathy    Non-obstructive Coronary artery disease    a. 05/2014  NSTEMI/Cath: LM nl, LAD mild dzs, LCX 60ost hazy (? culprit), RCA mild dzs. EF 60% by echo-->Med Rx; b. 08/2017 Cath: LM 30ost, LAD min irregs, LCX small, 40ost/p, RCA large, min irregs, RPDA/RPL1 nl, EF 50-55%. 4+MR.   Osteoarthritis    Permanent atrial fibrillation (HCC)    a. CHA2DS2VASc = 6-->Xarelto.   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   Past Surgical History:  Procedure Laterality Date   ABDOMINAL HYSTERECTOMY  1963   partial, secondary to fibroids   APPENDECTOMY     Billroth     CARDIAC CATHETERIZATION  05/12/14   [facility]   CARDIOVERSION N/A 12/26/2016   Procedure: CARDIOVERSION;  Surgeon: Iran Ouch, MD;  Location: [facility] ORS;  Service: Cardiovascular;  Laterality: N/A;   CARDIOVERSION N/A 05/16/2017   Procedure: CARDIOVERSION;  Surgeon: Iran Ouch, MD;  Location: [facility] ORS;  Service: Cardiovascular;  Laterality: N/A;   CARDIOVERSION N/A 06/09/2017   Procedure: CARDIOVERSION;  Surgeon: Iran Ouch, MD;  Location: [facility] ORS;  Service: Cardiovascular;  Laterality: N/A;   CARDIOVERSION N/A 11/30/2018   Procedure: CARDIOVERSION;  Surgeon: Iran Ouch, MD;  Location: [facility] ORS;  Service: Cardiovascular;  Laterality: N/A;   CHOLECYSTECTOMY  1998   COLONOSCOPY  2009   PACEMAKER IMPLANT N/A 09/28/2021   Procedure: PACEMAKER IMPLANT;  Surgeon: Lanier Prude, MD;  Location: [facility] INVASIVE CV LAB;  Service: Cardiovascular;  Laterality: N/A;   RIGHT/LEFT HEART CATH AND CORONARY ANGIOGRAPHY Bilateral 08/21/2017   Procedure: RIGHT/LEFT HEART CATH AND CORONARY ANGIOGRAPHY;  Surgeon: Iran Ouch, MD;  Location: [facility] INVASIVE CV LAB;  Service: Cardiovascular;  Laterality: Bilateral;   TEE WITHOUT CARDIOVERSION N/A 08/25/2017   Procedure: TRANSESOPHAGEAL ECHOCARDIOGRAM (TEE);  Surgeon: Dolores Patty, MD;  Location: [facility] ENDOSCOPY;  Service: Cardiovascular;  Laterality: N/A;   UPPER GI ENDOSCOPY  2004   Family History   Problem Relation Age of Onset   Stroke Mother    Esophageal cancer Brother        also had lung cancer   Ovarian cancer Daughter    Colon cancer Daughter    Breast cancer Neg Hx    Social History   Socioeconomic History   Marital status: Widowed    Spouse name: Not on file   Number of children: 2   Years of education: Not on file   Highest education level: Not on file  Occupational History   Occupation: retired  Tobacco Use   Smoking status: Never   Smokeless tobacco: Never   Tobacco comments:    Never smoke 01/23/22  Vaping Use   Vaping status: Never Used  Substance and Sexual Activity   Alcohol use: No    Alcohol/week: 0.0 standard drinks of alcohol   Drug use: No   Sexual activity: Never  Other Topics Concern   Not on file  Social History Narrative   Not on file   Social Determinants of Health   Financial Resource Strain: Low Risk  (06/17/2022)   Overall Financial Resource Strain (CARDIA)    Difficulty of Paying Living Expenses: Not hard at all  Food Insecurity: No Food Insecurity (06/17/2022)   Hunger Vital Sign    Worried About Running Out of Food in the Last Year: Never true    Ran Out of Food in the Last Year: Never true  Transportation Needs: No Transportation Needs (06/17/2022)   PRAPARE - Administrator, Civil Service (Medical): No    Lack of Transportation (Non-Medical): No  Physical Activity: Insufficiently Active (06/17/2022)   Exercise Vital Sign    Days of Exercise per Week: 3 days    Minutes of Exercise per Session: 40 min  Stress: No Stress Concern Present (06/17/2022)   [employer] of Occupational Health - Occupational Stress Questionnaire    Feeling of Stress : Not at all  Social Connections: Unknown (06/17/2022)   Social Connection and Isolation Panel [NHANES]    Frequency of Communication with Friends and Family: More than three times a week    Frequency of Social Gatherings with Friends and Family: More than three times a week     Attends Religious Services: Not on Marketing executive or Organizations: Not on file    Attends Banker Meetings: Not on file    Marital Status: Not on file     Review of Systems  Constitutional:  Negative for appetite change and unexpected weight change.  HENT:  Positive for congestion.        No sore throat now.  Nasal congestion.    Respiratory:  Positive for cough. Negative for chest tightness and shortness of breath.   Cardiovascular:  Negative for chest pain and palpitations.  Gastrointestinal:  Negative for abdominal pain, diarrhea, nausea and vomiting.  Genitourinary:  Negative for difficulty urinating  and dysuria.  Musculoskeletal:  Negative for joint swelling and myalgias.  Skin:  Negative for color change and rash.  Neurological:  Negative for dizziness and headaches.  Psychiatric/Behavioral:  Negative for agitation and dysphoric mood.        Objective:     BP (!) 146/88   Pulse (!) 111   Temp 97.9 F (36.6 C)   Resp 16   Ht 5\' 2"  (1.575 m)   Wt 138 lb (62.6 kg)   SpO2 97%   BMI 25.24 kg/m  Wt Readings from Last 3 Encounters:  12/04/22 138 lb (62.6 kg)  10/24/22 136 lb (61.7 kg)  10/16/22 137 lb 9.6 oz (62.4 kg)    Physical Exam Vitals reviewed.  Constitutional:      General: She is not in acute distress.    Appearance: Normal appearance.  HENT:     Head: Normocephalic and atraumatic.     Right Ear: External ear normal.     Left Ear: External ear normal.  Eyes:     General: No scleral icterus.       Right eye: No discharge.        Left eye: No discharge.     Conjunctiva/sclera: Conjunctivae normal.  Neck:     Thyroid: No thyromegaly.  Cardiovascular:     Rate and Rhythm: Normal rate and regular rhythm.  Pulmonary:     Effort: No respiratory distress.     Breath sounds: Normal breath sounds. No wheezing.     Comments: No increased cough with forced expiration.  Abdominal:     General: Bowel sounds are normal.      Palpations: Abdomen is soft.     Tenderness: There is no abdominal tenderness.  Musculoskeletal:        General: No swelling or tenderness.     Cervical back: Neck supple. No tenderness.  Lymphadenopathy:     Cervical: No cervical adenopathy.  Skin:    Findings: No erythema or rash.  Neurological:     Mental Status: She is alert.  Psychiatric:        Mood and Affect: Mood normal.        Behavior: Behavior normal.      Outpatient Encounter Medications as of 12/04/2022  Medication Sig   cefdinir (OMNICEF) 300 MG capsule Take 1 capsule (300 mg total) by mouth 2 (two) times daily.   diltiazem (CARDIZEM CD) 240 MG 24 hr capsule Take 240 mg by mouth daily.   dorzolamide (TRUSOPT) 2 % ophthalmic solution Place 1 drop into the left eye 2 (two) times daily.   fluticasone (FLONASE) 50 MCG/ACT nasal spray Place 2 sprays into both nostrils daily as needed for allergies.   gabapentin (NEURONTIN) 300 MG capsule One capsule tid and two capsules q hs.   ipratropium (ATROVENT) 0.03 % nasal spray Place 2 sprays into both nostrils 3 (three) times daily as needed for rhinitis.   latanoprost (XALATAN) 0.005 % ophthalmic solution Place 1 drop into both eyes at bedtime.    levothyroxine (SYNTHROID) 125 MCG tablet Take 1 tablet (125 mcg total) by mouth daily.   metoprolol tartrate (LOPRESSOR) 100 MG tablet TAKE 1 AND 1/2 TABLETS(150 MG) BY MOUTH TWICE DAILY   Multiple Vitamin (MULTI-VITAMIN) tablet Take 1 tablet by mouth at bedtime.    Probiotic Product (ALIGN) 4 MG CAPS Take one capsule q day   torsemide (DEMADEX) 20 MG tablet Take 1 tablet (20 mg total) by mouth daily.   traMADol (ULTRAM) 50 MG tablet  Take 1 tablet (50 mg total) by mouth 2 (two) times daily as needed.   ursodiol (ACTIGALL) 300 MG capsule Take 300-600 mg by mouth See admin instructions. Take 2 capsules (600 mg) daily in the morning   & 1 capsule (300 mg) at night.   XARELTO 15 MG TABS tablet TAKE 1 TABLET(15 MG) BY MOUTH DAILY WITH SUPPER    [DISCONTINUED] levothyroxine (SYNTHROID) 125 MCG tablet Take 1 tablet (125 mcg total) by mouth daily.   No facility-administered encounter medications on file as of 12/04/2022.     Lab Results  Component Value Date   WBC 5.5 12/04/2022   HGB 12.3 12/04/2022   HCT 40.6 12/04/2022   PLT 239 12/04/2022   GLUCOSE 49 (L) 12/04/2022   CHOL 167 10/14/2022   TRIG 91.0 10/14/2022   HDL 66.40 10/14/2022   LDLDIRECT 143.5 12/17/2012   LDLCALC 82 10/14/2022   ALT 18 12/04/2022   AST 15 12/04/2022   NA 138 12/04/2022   K 4.5 12/04/2022   CL 109 12/04/2022   CREATININE 1.07 (H) 12/04/2022   BUN 34 (H) 12/04/2022   CO2 20 (L) 12/04/2022   TSH 3.41 04/02/2022   INR 1.63 12/29/2017   HGBA1C 5.6 01/27/2018    No results found.     Assessment & Plan:  Acute diastolic CHF (congestive heart failure) (HCC) -     CBC with Differential/Platelet -     Comprehensive metabolic panel  Encounter for screening mammogram for malignant neoplasm of breast -     3D Screening Mammogram, Left and Right; Future  Cough, unspecified type Assessment & Plan: Cough and congestion as outlined.  Increased sinus pressure, drainage and cough.  Concern regarding sinusitis/URI.  Covid and flu - negative.  Increased air movement on exam.  No wheezing.  Will treat with omnicef.  Robitussin DM, saline nasal spray and steroid nasal spray as directed.  Check cxr.  Follow closely.  Call with update.   Orders: -     DG Chest 2 View; Future  Anemia in chronic kidney disease, unspecified CKD stage Assessment & Plan: Saw hematology 09/2022 - iron def.  IV iron.    Atherosclerotic heart disease of native coronary artery with other forms of angina pectoris [facility]) Assessment & Plan: Continue risk factor modification.  Continue metoprolol and xarelto. Overall stable.    Autoimmune hepatitis (HCC) Assessment & Plan: Follow liver function tests.  Followed by GI -  stable on ursodiol.  F/u abdominal ultrasound - Subtle  nodularity along the free edge of the liver is stable,  consistent with the history of cirrhosis. No focal hepatic lesions  are present. Mild increased echogenicity of the liver, stable.   Benign hypertension Assessment & Plan: On metoprolol and diltiazem.  Now taking torsemide daily. Follow pressures. Hold on making any changes.    Benign meningioma [facility]) Assessment & Plan: Evaluated by Dr Dwyane Luo (oncology [facility]).  States was told no further w/up or f/u neded.  Of note, no mention on MRI 2018.    Chronic diastolic heart failure (HCC) Assessment & Plan: Continue to weigh daily.  Taking torsemide daily.  Weight is relatively stable. She is having the cough and congestion, but reports overall breathing is stable.  No increased sob.    Cirrhosis of liver without ascites, unspecified hepatic cirrhosis type (HCC) Assessment & Plan: Stable on ursodiol.  F/u abdominal ultrasound - Subtle nodularity along the free edge of the liver is stable,  consistent with the history of cirrhosis.  No focal hepatic lesions  are present. Mild increased echogenicity of the liver, stable.    Stage 3b chronic kidney disease (HCC) Assessment & Plan:  Avoid antiinflammatories.  Follow metabolic panel. Followed by nephrology.  Given just started daily torsemide, will check metabolic panel today.    Gastroesophageal reflux disease, unspecified whether esophagitis present Assessment & Plan: Upper symptoms controlled.  On protonix.    Hypothyroidism, unspecified type Assessment & Plan: On thyroid replacement.  Follow tsh.     Osteoporosis without current pathological fracture, unspecified osteoporosis type Assessment & Plan: Seeing Dr Tedd Sias - last 11/05/22 - prolia.   Other polyneuropathy Assessment & Plan: On gabapentin.    Persistent atrial fibrillation (HCC) Assessment & Plan: Followed by cardiology - will notice heart rate is up at times.  No chest pain or increased sob.  Recommended  continuing current medication regimen. Overall she feels is stable.  Follow.  Treat current infection.    Primary biliary cholangitis (HCC) Assessment & Plan: Followed by GI. Stable. Follow liver function tests.     Pulmonary hypertension, unspecified (HCC) Assessment & Plan: Followed by cardiology.    Secondary hypercoagulable state [facility]) Assessment & Plan: Continue xarelto.    Other orders -     Levothyroxine Sodium; Take 1 tablet (125 mcg total) by mouth daily.  Dispense: 90 tablet; Refill: 3 -     Cefdinir; Take 1 capsule (300 mg total) by mouth 2 (two) times daily.  Dispense: 20 capsule; Refill: 0     Dale Hocking, MD

## 2022-12-05 ENCOUNTER — Encounter: Payer: Self-pay | Admitting: Internal Medicine

## 2022-12-05 ENCOUNTER — Telehealth: Payer: Self-pay

## 2022-12-05 NOTE — Assessment & Plan Note (Signed)
Evaluated by Dr Dwyane Luo (oncology [facility]).  States was told no further w/up or f/u neded.  Of note, no mention on MRI 2018.

## 2022-12-05 NOTE — Assessment & Plan Note (Signed)
Avoid antiinflammatories.  Follow metabolic panel. Followed by nephrology.  Given just started daily torsemide, will check metabolic panel today.

## 2022-12-05 NOTE — Assessment & Plan Note (Signed)
On thyroid replacement.  Follow tsh.  

## 2022-12-05 NOTE — Telephone Encounter (Signed)
-----   Message from [place] sent at 12/05/2022  9:07 AM EDT ----- Have tried to call pt multiple times. Unable to reach.  Left her a message. Reviewed labs and cxr results.  See cxr result note as well.  Notify her kidney function has improved.  Continue torsemide daily as she is doing.  Potassium wnl.  White blood cell count and hgb wnl.  Her sugar is low on the lab draw. I am not sure if she ate prior to coming to her appt.  Please confirm she is eating and not having any feelings like her sugar is dropping.  Needs to eat regular meals. Let me know if any problems.  See cxr result note as well.

## 2022-12-05 NOTE — Assessment & Plan Note (Signed)
Continue xarelto

## 2022-12-05 NOTE — Assessment & Plan Note (Signed)
On gabapentin.

## 2022-12-05 NOTE — Assessment & Plan Note (Signed)
Cough and congestion as outlined.  Increased sinus pressure, drainage and cough.  Concern regarding sinusitis/URI.  Covid and flu - negative.  Increased air movement on exam.  No wheezing.  Will treat with omnicef.  Robitussin DM, saline nasal spray and steroid nasal spray as directed.  Check cxr.  Follow closely.  Call with update.

## 2022-12-05 NOTE — Telephone Encounter (Signed)
LMTCB

## 2022-12-05 NOTE — Assessment & Plan Note (Signed)
Continue to weigh daily.  Taking torsemide daily.  Weight is relatively stable. She is having the cough and congestion, but reports overall breathing is stable.  No increased sob.

## 2022-12-05 NOTE — Assessment & Plan Note (Addendum)
Followed by cardiology - will notice heart rate is up at times.  No chest pain or increased sob.  Recommended continuing current medication regimen. Overall she feels is stable.  Follow.  Treat current infection.

## 2022-12-05 NOTE — Telephone Encounter (Signed)
Pt called stating she would like to get some benzonatate for her cough

## 2022-12-05 NOTE — Assessment & Plan Note (Signed)
Upper symptoms controlled.  On protonix.

## 2022-12-05 NOTE — Assessment & Plan Note (Signed)
Stable on ursodiol.  F/u abdominal ultrasound - Subtle nodularity along the free edge of the liver is stable,  consistent with the history of cirrhosis. No focal hepatic lesions  are present. Mild increased echogenicity of the liver, stable.

## 2022-12-05 NOTE — Assessment & Plan Note (Signed)
Seeing Dr Tedd Sias - last 11/05/22 - prolia.

## 2022-12-05 NOTE — Assessment & Plan Note (Signed)
Continue risk factor modification.  Continue metoprolol and xarelto. Overall stable.

## 2022-12-05 NOTE — Assessment & Plan Note (Signed)
Followed by cardiology

## 2022-12-05 NOTE — Telephone Encounter (Signed)
-----   Message from [place] sent at 12/05/2022  9:03 AM EDT ----- I have tried calling Ruth Roth several times.  Unable to reach.  Left her a message.  Wanted to check up on her and confirm doing ok.  I am currently treating her respiratory infection.  Her cxr does not reveal pneumonia.  Does have what appears to be some mild vascular congestion (fluid).  Cardiology just recently increased her torsemide to daily.  I want to continue daily torsemide.  Needs to keep [place] posted on how she is doing.  See lab result note as well.

## 2022-12-05 NOTE — Assessment & Plan Note (Signed)
Followed by GI.  Stable.  Follow liver function tests.

## 2022-12-05 NOTE — Assessment & Plan Note (Signed)
Saw hematology 09/2022 - iron def.  IV iron.

## 2022-12-05 NOTE — Assessment & Plan Note (Signed)
Follow liver function tests.  Followed by GI -  stable on ursodiol.  F/u abdominal ultrasound - Subtle nodularity along the free edge of the liver is stable,  consistent with the history of cirrhosis. No focal hepatic lesions  are present. Mild increased echogenicity of the liver, stable.

## 2022-12-05 NOTE — Assessment & Plan Note (Signed)
On metoprolol and diltiazem.  Now taking torsemide daily. Follow pressures. Hold on making any changes.

## 2022-12-06 NOTE — Telephone Encounter (Signed)
I spoke with patient & asked if she picked up her abx Ruth Roth) yet? PT stated that she went to the pharmacy twice but they didn't have any for her. Pt asked that I called it back in and she would pick it up and try that instead of a cough medicine.  I called [facility] & they stated it was picked up Wed after 3pm.   I tried to call patient back to confirm & there was no answer or voicemail.

## 2022-12-06 NOTE — Telephone Encounter (Signed)
I asked pt to go back and look at her bottle for Cefdinir (generic name) 300mg .  Because I called the pharmacy again & they confirmed that she picked it up on Wed after 3 and paid a $19.95 copay for the medication.  Pt stated she will look for it & let me know if she can not find it.

## 2022-12-06 NOTE — Telephone Encounter (Signed)
Patient states she went to the [facility] in [place] twice and they did not have the [place] for her.  Patient states she went Wednesday (12/04/2022) and Thursday (12/05/2022) and they did not have it.

## 2022-12-09 NOTE — Telephone Encounter (Signed)
If she is continuing to improve, would continue abx.  If persistent symptoms, will need to be seen/reevaluated.

## 2022-12-09 NOTE — Telephone Encounter (Signed)
FYI for you, update on patient:  She is aware of labs and cxr results. Will continue torsemide daily per cardiology and your instruction. She did get her medication and has been taking it since last Thursday. She is feeling some better. She says she still has a lot of nasal congestion and she is still wheezing some (she says she mentioned this to you at her visit.) Denies increased sob, fever, chills, body aches, etc. Overall she feels like she is doing better and improving. Discussed her coming in for f/u this week since she is not completely better. Patient says that she will schedule something if you think it is necessary but she is also ok to complete course of abx and see how she feels. Patient is aware if any change/worsening symptoms- will be evaluated more acutely.   Regarding her labs, she was fasting day of lab draw but has not had any symptoms of low sugars.

## 2022-12-09 NOTE — Telephone Encounter (Signed)
Please call and confirm Ruth Roth is doing ok.  Also please confirm she did get her medication.

## 2022-12-10 NOTE — Telephone Encounter (Signed)
LMTCB

## 2022-12-13 NOTE — Telephone Encounter (Signed)
LMTCB

## 2022-12-13 NOTE — Telephone Encounter (Signed)
Spoke with patient. She is not continuing to feel better. Still coughing and congested. Advised since symptoms not improving- needs to go to urgent care. She has 2 days of abx left. Patient said she will go to urgent care if no better over the weekend or if symptoms worsen. Pt asked for appt for re-evaluation. Scheduled with Ruth Roth on Monday. Pt will cancel appt if is evaluated over the weekend.

## 2022-12-16 ENCOUNTER — Ambulatory Visit (INDEPENDENT_AMBULATORY_CARE_PROVIDER_SITE_OTHER): Payer: Medicare Other | Admitting: Family

## 2022-12-16 ENCOUNTER — Encounter: Payer: Self-pay | Admitting: Family

## 2022-12-16 VITALS — BP 130/76 | HR 108 | Temp 97.8°F | Ht 62.0 in | Wt 139.8 lb

## 2022-12-16 DIAGNOSIS — R0600 Dyspnea, unspecified: Secondary | ICD-10-CM | POA: Diagnosis not present

## 2022-12-16 MED ORDER — LEVALBUTEROL TARTRATE 45 MCG/ACT IN AERO
1.0000 | INHALATION_SPRAY | Freq: Four times a day (QID) | RESPIRATORY_TRACT | 1 refills | Status: DC | PRN
Start: 2022-12-16 — End: 2023-05-23

## 2022-12-16 NOTE — Patient Instructions (Signed)
Please go to [facility] to have your chest x-ray done today  I prescribed you with an inhaler which is a similar medication as nebulizer you had today.  This is to open your lungs up , and hopefully clear some of that mucus.  Please continue Robitussin DM for cough and as an expectorant  Please let me know how you are doing and if any new concerns

## 2022-12-16 NOTE — Assessment & Plan Note (Addendum)
Afebrile. Walking SaO2 remained 93%.  Patient was not particularly labored in speech as we walked and talked up and down the hallway however when we sat down in the room, patient desired to take her mask off and felt more 'winded'. Sa02 remained 91-94%.

## 2022-12-16 NOTE — Progress Notes (Unsigned)
Assessment & Plan:  There are no diagnoses linked to this encounter.   Return precautions given.   Risks, benefits, and alternatives of the medications and treatment plan prescribed today were discussed, and patient expressed understanding.   Education regarding symptom management and diagnosis given to patient on AVS either electronically or printed.  No follow-ups on file.  Rennie Plowman, FNP  Subjective:    [name], female    DOB: October 20, 1928, 89 y.o.   MRN: [id number]  CC: Ruth Roth is a 89 y.o. female who presents today for an acute visit.    HPI: Complains of productive cough 3 weeks, worsening.   Endorses wheezing, shortness of breath, nasal congestion  Describes feeling SOB with long sentences   Unsure if omnicef was helpful.      No weight gain, leg swelling, orthopnea.   Seen by dr Lorin Picket 12/04/22; treated with Omnicef, Robitussin DM, saline nasal spray.  Compliant with metoprolol, Xarelto; she has a pacemaker  History of persistent atrial fibrillation, cirrhosis, CKD, chronic diastolic heart failure, MI  Compliant with torsemide daily  Chest x-ray 12/04/2022 with mild cardiomegaly, central pulmonary vascular congestion  BNP 634 , 4 weeks ago GFR 49  Following with heart failure clinic, last seen 11/18/2022 follow-up chronic diastolic congestive heart failure, severe mitral regurgitation, chronic atrial fibrillation; increased to torsemide 20 mg daily  Follow-up cardiology, Dr. Gala Romney tomorrow  Never smoker  No lung disease.   Lives alone   Allergies: Statins, Hydroxychloroquine, Haldol [haloperidol lactate], Librax [chlordiazepoxide-clidinium], Plavix [clopidogrel bisulfate], and Ramipril Current Outpatient Medications on File Prior to Visit  Medication Sig Dispense Refill   cefdinir (OMNICEF) 300 MG capsule Take 1 capsule (300 mg total) by mouth 2 (two) times daily. 20 capsule 0   diltiazem (CARDIZEM CD) 240 MG 24 hr capsule  Take 240 mg by mouth daily.     dorzolamide (TRUSOPT) 2 % ophthalmic solution Place 1 drop into the left eye 2 (two) times daily.     fluticasone (FLONASE) 50 MCG/ACT nasal spray Place 2 sprays into both nostrils daily as needed for allergies.     gabapentin (NEURONTIN) 300 MG capsule One capsule tid and two capsules q hs. 150 capsule 2   ipratropium (ATROVENT) 0.03 % nasal spray Place 2 sprays into both nostrils 3 (three) times daily as needed for rhinitis.     latanoprost (XALATAN) 0.005 % ophthalmic solution Place 1 drop into both eyes at bedtime.      levothyroxine (SYNTHROID) 125 MCG tablet Take 1 tablet (125 mcg total) by mouth daily. 90 tablet 3   metoprolol tartrate (LOPRESSOR) 100 MG tablet TAKE 1 AND 1/2 TABLETS(150 MG) BY MOUTH TWICE DAILY 270 tablet 0   Multiple Vitamin (MULTI-VITAMIN) tablet Take 1 tablet by mouth at bedtime.      Probiotic Product (ALIGN) 4 MG CAPS Take one capsule q day 30 capsule 0   torsemide (DEMADEX) 20 MG tablet Take 1 tablet (20 mg total) by mouth daily. 90 tablet 3   traMADol (ULTRAM) 50 MG tablet Take 1 tablet (50 mg total) by mouth 2 (two) times daily as needed. 60 tablet 0   ursodiol (ACTIGALL) 300 MG capsule Take 300-600 mg by mouth See admin instructions. Take 2 capsules (600 mg) daily in the morning   & 1 capsule (300 mg) at night.     XARELTO 15 MG TABS tablet TAKE 1 TABLET(15 MG) BY MOUTH DAILY WITH SUPPER 90 tablet 1   No current facility-administered  medications on file prior to visit.    Review of Systems  Constitutional:  Negative for chills and fever.  HENT:  Positive for congestion. Negative for sinus pressure and sinus pain.   Respiratory:  Positive for cough, shortness of breath and wheezing.   Cardiovascular:  Negative for chest pain, palpitations and leg swelling.  Gastrointestinal:  Negative for nausea and vomiting.      Objective:    BP 130/76   Pulse (!) 108   Temp 97.8 F (36.6 C) (Oral)   Ht 5\' 2"  (1.575 m)   Wt 139 lb  12.8 oz (63.4 kg)   SpO2 94%   BMI 25.57 kg/m   BP Readings from Last 3 Encounters:  12/16/22 130/76  12/04/22 (!) 146/88  11/18/22 132/78   Wt Readings from Last 3 Encounters:  12/16/22 139 lb 12.8 oz (63.4 kg)  12/04/22 138 lb (62.6 kg)  10/24/22 136 lb (61.7 kg)    Physical Exam Vitals reviewed.  Constitutional:      Appearance: She is well-developed.  HENT:     Head: Normocephalic and atraumatic.     Right Ear: Hearing, tympanic membrane, ear canal and external ear normal. No decreased hearing noted. No drainage, swelling or tenderness. No middle ear effusion. No foreign body. Tympanic membrane is not erythematous or bulging.     Left Ear: Hearing, tympanic membrane, ear canal and external ear normal. No decreased hearing noted. No drainage, swelling or tenderness.  No middle ear effusion. No foreign body. Tympanic membrane is not erythematous or bulging.     Nose: Nose normal. No rhinorrhea.     Right Sinus: No maxillary sinus tenderness or frontal sinus tenderness.     Left Sinus: No maxillary sinus tenderness or frontal sinus tenderness.     Mouth/Throat:     Pharynx: Uvula midline. No oropharyngeal exudate or posterior oropharyngeal erythema.     Tonsils: No tonsillar abscesses.  Eyes:     Conjunctiva/sclera: Conjunctivae normal.  Cardiovascular:     Rate and Rhythm: Regular rhythm.     Pulses: Normal pulses.     Heart sounds: Normal heart sounds.  Pulmonary:     Effort: Pulmonary effort is normal.     Breath sounds: Examination of the right-upper field reveals wheezing. Examination of the left-upper field reveals wheezing. Wheezing present. No rhonchi or rales.     Comments: Few expiratory wheezes on exam. No crackles.  Musculoskeletal:     Right lower leg: No edema.     Left lower leg: No edema.  Lymphadenopathy:     Head:     Right side of head: No submental, submandibular, tonsillar, preauricular, posterior auricular or occipital adenopathy.     Left side of  head: No submental, submandibular, tonsillar, preauricular, posterior auricular or occipital adenopathy.     Cervical: No cervical adenopathy.  Skin:    General: Skin is warm and dry.  Neurological:     Mental Status: She is alert.  Psychiatric:        Speech: Speech normal.        Behavior: Behavior normal.        Thought Content: Thought content normal.   Patient felt significantly better after albuterol treatment. Lung sounds clear and increased

## 2022-12-17 ENCOUNTER — Ambulatory Visit: Payer: Medicare Other | Admitting: Internal Medicine

## 2022-12-17 ENCOUNTER — Ambulatory Visit
Admission: RE | Admit: 2022-12-17 | Discharge: 2022-12-17 | Disposition: A | Discharge status: Home / Self Care | Payer: Medicare Other | Source: Ambulatory Visit | Attending: Family | Admitting: Family

## 2022-12-17 VITALS — BP 135/90 | HR 104 | Wt 141.0 lb

## 2022-12-17 DIAGNOSIS — I4821 Permanent atrial fibrillation: Secondary | ICD-10-CM | POA: Diagnosis not present

## 2022-12-17 DIAGNOSIS — R053 Chronic cough: Secondary | ICD-10-CM | POA: Diagnosis not present

## 2022-12-17 DIAGNOSIS — I4891 Unspecified atrial fibrillation: Secondary | ICD-10-CM | POA: Diagnosis not present

## 2022-12-17 DIAGNOSIS — J069 Acute upper respiratory infection, unspecified: Secondary | ICD-10-CM | POA: Insufficient documentation

## 2022-12-17 DIAGNOSIS — R0600 Dyspnea, unspecified: Secondary | ICD-10-CM | POA: Insufficient documentation

## 2022-12-17 DIAGNOSIS — I5032 Chronic diastolic (congestive) heart failure: Secondary | ICD-10-CM

## 2022-12-17 DIAGNOSIS — I340 Nonrheumatic mitral (valve) insufficiency: Secondary | ICD-10-CM | POA: Insufficient documentation

## 2022-12-17 DIAGNOSIS — I5030 Unspecified diastolic (congestive) heart failure: Secondary | ICD-10-CM | POA: Diagnosis not present

## 2022-12-17 DIAGNOSIS — N1832 Chronic kidney disease, stage 3b: Secondary | ICD-10-CM | POA: Insufficient documentation

## 2022-12-17 DIAGNOSIS — R0609 Other forms of dyspnea: Secondary | ICD-10-CM | POA: Diagnosis not present

## 2022-12-17 NOTE — Progress Notes (Signed)
Advanced Heart Failure Clinic Note   Date:  12/17/2022   ID:  Ruth Roth, DOB Mar 30, 1928, MRN [id number]  Location: Home  Provider location: [place] [facility] Type of Visit: Established patient  PCP:  Dale Scranton, MD  Cardiologist:  Lorine Bears, MD Primary HF: [name] Nephrology: Dr. Cherylann Ratel  Chief Complaint: Heart Failure follow-up   History of Present Illness: Ruth Roth is a 89 y.o. female with a history of CAD, chronic diastolic HF with severe mitral reguritationm, chronic atrial fibrillation, hypertension, peptic ulcer disease.   Admitted [facility] 08/21/17 s/p R/LHC which showed significantly elevated filling pressures, severe MR, and low cardiac output, and then transferred to [facility] 6/14 for consideration of MitraClip.  AKI noted on admission felt to be due to cardiorenal syndrome. She required milrinone and lasix for diuresis. Diuresed 25 lbs and transitioned to 20 mg torsemide daily. Case and images reviewed at length with structural heart team. MR felt to be truly functional with improvement from hemodynamic optimization. Not felt to be MitraClip candidate. Discharge weight: 121 lbs.    In 2020 had recurrent AF with RVR Amio 200 bid started. Underwent DC-CV on 9/210/20. PCP cut back torsemide to 20 qOD due to AKI but developed volume overload. Torsemide increased back to 20 daily. Developed recurrent AF and Amio stopped. Saw Dr. Kirke Corin in 04/30/19 and metoprolol increased to 75 bid for better rate control. Says she felt better on the 50 but is tolerating this ok.  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   In 1/24 was seen in office and was in rapid AFL 120s (usually in rate controlled AF). Failed pace-termination efforts. Zio placed - Zio AF/AFL occurred continuously (100% burden), ranging from 68-159 bpm (avg of 80 bpm).   Echo 1/24 EF 60-65% mild MR  Here for f/u. Able to do all ADLs with mild SOB. Takes breaks as needed. No  swelling. Notices HR goes up at time. No syncope or presyncope. Compliant with meds. Struggling with sinus infection   Labs (8/24): K 5.1, creatinine 1.11  [facility] 08/21/2017 - Mild non-obstructive CAD RHC Procedural Findings: (Obtained from Scan 08/21/17 at 1030) Hemodynamics (mmHg) RA mean 17 RV 68/11 (19) PA 62/27 (44) PCWP 41  AO 147/67 (102) Cardiac Output (Fick) 2.45 Cardiac Index  (Fick) 1.46 PVR 1.23 WU     Past Medical History:  Diagnosis Date   (HFpEF) heart failure with preserved ejection fraction (HCC)    a. 07/2017 Echo: EF 50-55%, no rwma, mild AS/AI, mod MR, mod dil LA, mod TR, PASP ; b. 03/2021 Echo: EF 60-65%. No rwma. Nl RV fxn. Sev BAE. Mild MR. Mild AI/AS.   Autoimmune hepatitis (HCC)    followed by Dr Marva Panda   Fibrocystic breast disease    Glaucoma    Hypercholesterolemia    Hypertension    Hypothyroidism    Inflammatory arthritis    MI (myocardial infarction) (HCC)    Mitral regurgitation    a. 07/2017 Echo: Mod MR; b. 08/2017 Cath: 4+/Severe MR; c. 08/2017 TEE: Mild MR; d. 03/2021 Echo: Mild MR.   Neuropathy    Non-obstructive Coronary artery disease    a. 05/2014 NSTEMI/Cath: LM nl, LAD mild dzs, LCX 60ost hazy (? culprit), RCA mild dzs. EF 60% by echo-->Med Rx; b. 08/2017 Cath: LM 30ost, LAD min irregs, LCX small, 40ost/p, RCA large, min irregs, RPDA/RPL1 nl, EF 50-55%. 4+MR.   Osteoarthritis    Permanent atrial fibrillation (HCC)  a. CHA2DS2VASc = 6-->Xarelto.   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   Past Surgical History:  Procedure Laterality Date   ABDOMINAL HYSTERECTOMY  1963   partial, secondary to fibroids   APPENDECTOMY     Billroth     CARDIAC CATHETERIZATION  05/12/14   [facility]   CARDIOVERSION N/A 12/26/2016   Procedure: CARDIOVERSION;  Surgeon: Iran Ouch, MD;  Location: [facility] ORS;  Service: Cardiovascular;  Laterality: N/A;   CARDIOVERSION N/A 05/16/2017   Procedure:  CARDIOVERSION;  Surgeon: Iran Ouch, MD;  Location: [facility] ORS;  Service: Cardiovascular;  Laterality: N/A;   CARDIOVERSION N/A 06/09/2017   Procedure: CARDIOVERSION;  Surgeon: Iran Ouch, MD;  Location: [facility] ORS;  Service: Cardiovascular;  Laterality: N/A;   CARDIOVERSION N/A 11/30/2018   Procedure: CARDIOVERSION;  Surgeon: Iran Ouch, MD;  Location: [facility] ORS;  Service: Cardiovascular;  Laterality: N/A;   CHOLECYSTECTOMY  1998   COLONOSCOPY  2009   PACEMAKER IMPLANT N/A 09/28/2021   Procedure: PACEMAKER IMPLANT;  Surgeon: Lanier Prude, MD;  Location: [facility] INVASIVE CV LAB;  Service: Cardiovascular;  Laterality: N/A;   RIGHT/LEFT HEART CATH AND CORONARY ANGIOGRAPHY Bilateral 08/21/2017   Procedure: RIGHT/LEFT HEART CATH AND CORONARY ANGIOGRAPHY;  Surgeon: Iran Ouch, MD;  Location: [facility] INVASIVE CV LAB;  Service: Cardiovascular;  Laterality: Bilateral;   TEE WITHOUT CARDIOVERSION N/A 08/25/2017   Procedure: TRANSESOPHAGEAL ECHOCARDIOGRAM (TEE);  Surgeon: Dolores Patty, MD;  Location: [facility] ENDOSCOPY;  Service: Cardiovascular;  Laterality: N/A;   UPPER GI ENDOSCOPY  2004     Current Outpatient Medications  Medication Sig Dispense Refill   cefdinir (OMNICEF) 300 MG capsule Take 1 capsule (300 mg total) by mouth 2 (two) times daily. 20 capsule 0   diltiazem (CARDIZEM CD) 240 MG 24 hr capsule Take 240 mg by mouth daily.     dorzolamide (TRUSOPT) 2 % ophthalmic solution Place 1 drop into the left eye 2 (two) times daily.     fluticasone (FLONASE) 50 MCG/ACT nasal spray Place 2 sprays into both nostrils daily as needed for allergies.     gabapentin (NEURONTIN) 300 MG capsule One capsule tid and two capsules q hs. 150 capsule 2   ipratropium (ATROVENT) 0.03 % nasal spray Place 2 sprays into both nostrils 3 (three) times daily as needed for rhinitis.     latanoprost (XALATAN) 0.005 % ophthalmic solution Place 1 drop into both eyes at bedtime.      levalbuterol (XOPENEX  HFA) 45 MCG/ACT inhaler Inhale 1 puff into the lungs every 6 (six) hours as needed for wheezing or shortness of breath. 1 each 1   levothyroxine (SYNTHROID) 125 MCG tablet Take 1 tablet (125 mcg total) by mouth daily. 90 tablet 3   metoprolol tartrate (LOPRESSOR) 100 MG tablet TAKE 1 AND 1/2 TABLETS(150 MG) BY MOUTH TWICE DAILY 270 tablet 0   Multiple Vitamin (MULTI-VITAMIN) tablet Take 1 tablet by mouth at bedtime.      Probiotic Product (ALIGN) 4 MG CAPS Take one capsule q day 30 capsule 0   torsemide (DEMADEX) 20 MG tablet Take 1 tablet (20 mg total) by mouth daily. 90 tablet 3   traMADol (ULTRAM) 50 MG tablet Take 1 tablet (50 mg total) by mouth 2 (two) times daily as needed. 60 tablet 0   ursodiol (ACTIGALL) 300 MG capsule Take 300-600 mg by mouth See admin instructions. Take 2 capsules (600 mg) daily in the morning   &  1 capsule (300 mg) at night.     XARELTO 15 MG TABS tablet TAKE 1 TABLET(15 MG) BY MOUTH DAILY WITH SUPPER 90 tablet 1   No current facility-administered medications for this visit.    Allergies:   Statins, Hydroxychloroquine, Haldol [haloperidol lactate], Librax [chlordiazepoxide-clidinium], Plavix [clopidogrel bisulfate], and Ramipril   Social History:  The patient  reports that she has never smoked. She has never used smokeless tobacco. She reports that she does not drink alcohol and does not use drugs.   Family History:  The patient's family history includes Colon cancer in her daughter; Esophageal cancer in her brother; Ovarian cancer in her daughter; Stroke in her mother.   ROS:  Please see the history of present illness.   All other systems are personally reviewed and negative.   Vitals:   12/17/22 1429  BP: (!) 135/90  Pulse: (!) 104  SpO2: 95%  Weight: 141 lb (64 kg)    Exam:   General:  Elderly No resp difficulty HEENT: normal Neck: supple. no JVD. Carotids 2+ bilat; no bruits. No lymphadenopathy or thryomegaly appreciated. Cor: PMI nondisplaced.  Irregular. Tachy No rubs, gallops or murmurs. Lungs: coarse Abdomen: soft, nontender, nondistended. No hepatosplenomegaly. No bruits or masses. Good bowel sounds. Extremities: no cyanosis, clubbing, rash, edema Neuro: alert & orientedx3, cranial nerves grossly intact. moves all 4 extremities w/o difficulty. Affect pleasant   Recent Labs: 04/02/2022: TSH 3.41 11/18/2022: BNP 634.6 12/04/2022: ALT 18; BUN 34; Creatinine, Ser 1.07; Hemoglobin 12.3; Platelets 239; Potassium 4.5; Sodium 138  Personally reviewed   Wt Readings from Last 3 Encounters:  12/17/22 141 lb (64 kg)  12/16/22 139 lb 12.8 oz (63.4 kg)  12/04/22 138 lb (62.6 kg)     ASSESSMENT AND PLAN: 1.  Chronic diastolic congestive heart failure - Echo 08/01/17 LVEF 50-55%, Mild AS, Mod MR, Mod LAE, Mild RAE, Mod TR, RV moderately HK PA peak pressure 65 mm Hg. - Cath 08/21/17 with signficantly elevated filling pressures, severe MR, and low cardiac output. Had cardiorenal syndrome on admit with severe MR and RV failure and required milrinone - Echo 10/09/17: EF 55-60%, mild to mod MR - Echo 12/20 EF 60-65% mod MR  - Echo 12/21 EF 55-60% Moderate RV HK.  Moderate MR  - Echo 03/16/21 EF 60-65% RV normal. Mild MR/AI Personally reviewed - Echo 1/24 EF 60-65%, RV normal, LA/RA severely dilated, mild MR, mild-mod TR - NYHA class II, some volume overload on exam.  PCP decreased torsemide to 20 mg every other day.  In the past, she was taking 40 daily alternating with 20 daily.  I will increase her back to torsemide 20 mg daily.  BMET/BNP today, BMET in 10 days.  - Have not used SGLT2i with age and risk of UTI  2.  Severe mitral regurgitation:  - Echo 08/01/17 LVEF 50-55%, Mild AS, Mod MR, Mod LAE, Mild RAE, Mod TR, PA peak pressure 65 mm Hg. MR ++ calcified, and RV mild/moderately reduced function.  - TEE 6/17 showed only mild MR with restricted posterior leaflet.  -Echo 10/09/17: EF 55-60%, mild to mod MR  - Echo 12/20 EF 60-65% mod MR  - Echo  03/16/21 EF 60-65% RV normal. Mild MR/AI  - Echo 1/24 EF 60-65%, RV normal, mild MR - Likely functional MR and MR has improved with hemodynamic optimization.  MR now mild.  No significant systolic murmur.    3.  Chronic atrial fibrillation with tachy/brady syndrome / Atrial Flutter  - Has previously  failed amiodarone and DCCV x3.  - Continue Xarelto. No bleeding. CBC today - Zio 9/21 Chronic AF. Mean HR 90.  - Now s/p PPM in 7/23. Device unable to fully be interrogated today.  - Currently on 240 diltiazem and 150 metoprolol BID, continue.  - Had episode of rapid AFL 1/24 (usually has chronic AF) - Zio 2/24 AF/AFL occurred continuously (100% burden), ranging from 68-159 bpm (avg of 80 bpm).  - HR overall well controlled but still with bursts of RVR  4.  Essential hypertension:  - Blood pressure well controlled. Continue current regimen.  5. CKD 3 with h/o AKI - Recent labs with Scr 1.11 - labs today   Arvilla Meres, MD  3:08 PM   Advanced [facility] Failure Clinic [facility] [address] [facility] [place] [address] [phone] (office) [phone] (fax)

## 2022-12-17 NOTE — Patient Instructions (Signed)
Do the following things EVERYDAY: Weigh yourself in the morning before breakfast. Write it down and keep it in a log. Take your medicines as prescribed Eat low salt foods--Limit salt (sodium) to 2000 mg per day.  Stay as active as you can everyday Limit all fluids for the day to less than 2 liters    Please follow up in 6 months with Dr. Gala Romney.

## 2022-12-19 ENCOUNTER — Encounter: Payer: Self-pay | Admitting: Family

## 2022-12-19 ENCOUNTER — Ambulatory Visit (INDEPENDENT_AMBULATORY_CARE_PROVIDER_SITE_OTHER): Payer: Medicare Other | Admitting: Family

## 2022-12-19 ENCOUNTER — Ambulatory Visit: Payer: Medicare Other | Admitting: Internal Medicine

## 2022-12-19 VITALS — BP 136/80 | HR 107 | Temp 97.9°F | Ht 62.0 in | Wt 139.1 lb

## 2022-12-19 DIAGNOSIS — J40 Bronchitis, not specified as acute or chronic: Secondary | ICD-10-CM | POA: Diagnosis not present

## 2022-12-19 MED ORDER — DOXYCYCLINE HYCLATE 100 MG PO TABS
100.0000 mg | ORAL_TABLET | Freq: Two times a day (BID) | ORAL | 0 refills | Status: AC
Start: 2022-12-19 — End: 2022-12-26

## 2022-12-19 MED ORDER — BUDESONIDE-FORMOTEROL FUMARATE 80-4.5 MCG/ACT IN AERO: 2.0000 | INHALATION_SPRAY | Freq: Two times a day (BID) | RESPIRATORY_TRACT | 1 refills | Status: DC

## 2022-12-19 NOTE — Assessment & Plan Note (Signed)
Chest tightness resolved with Xopenex.  Persistent dry cough.  Provided another DuoNeb today.  Opted to start doxycycline to cover for atypical pathogens.  Start Symbicort.  Close follow-up.

## 2022-12-19 NOTE — Patient Instructions (Addendum)
Please start again robitussin DM for cough suppression and to break up congestion.  You may buy Robitussin DM over-the-counter at the pharmacy.   I have sent in a new antibiotic to cover for atypical pathogens.   Please start doxycycline ( antibiotic).  Please ensure that you are eating 1-2 servings of yogurt daily while you are on the antibiotic and for a week or 2 after you have completed the antibiotic to prevent diarrheal infections.  I have also sent in Symbicort inhaler which has a low-dose of prednisone ( steroid).  After all the symptoms below ever, you may simply stop the Symbicort inhaler as well as the Xopenex inhaler and put them both in the back your medicine cabinet as you may need with a future cold.   Please let me know how you are doing

## 2022-12-19 NOTE — Progress Notes (Signed)
Assessment & Plan:  Bronchitis Assessment & Plan: Chest tightness resolved with Xopenex.  Persistent dry cough.  Provided another DuoNeb today.  Opted to start doxycycline to cover for atypical pathogens.  Start Symbicort.  Close follow-up.  Orders: -     Doxycycline Hyclate; Take 1 tablet (100 mg total) by mouth 2 (two) times daily for 7 days.  Dispense: 14 tablet; Refill: 0 -     Budesonide-Formoterol Fumarate; Inhale 2 puffs into the lungs 2 (two) times daily.  Dispense: 1 each; Refill: 1     Return precautions given.   Risks, benefits, and alternatives of the medications and treatment plan prescribed today were discussed, and patient expressed understanding.   Education regarding symptom management and diagnosis given to patient on AVS either electronically or printed.  No follow-ups on file.  Rennie Plowman, FNP  Subjective:    [name], female    DOB: 1928/07/31, 89 y.o.   MRN: [id number]  CC: Ruth Roth is a 89 y.o. female who presents today for an acute visit.    HPI: Follow up cough She continues have dry cough, unchanged.     Compliant with xopenex and chest tightness resolved. SOB has improved. Occasional wheeze, nasal congestion.   No CP, leg swelling.   Seen by Dr Gala Romney 12/17/22; no medication changes per patient. (Note is not closed )  Seen by dr Lorin Picket 12/04/22; treated with Omnicef, Robitussin DM, saline nasal spray.   CXR 12/17/22 No active cardiopulmonary disease. Stable heart size   No h/o  prolonged QT EKG 09/03/22 QT 334/455 Crt 1.07  Allergies: Statins, Hydroxychloroquine, Haldol [haloperidol lactate], Librax [chlordiazepoxide-clidinium], Plavix [clopidogrel bisulfate], and Ramipril Current Outpatient Medications on File Prior to Visit  Medication Sig Dispense Refill   cefdinir (OMNICEF) 300 MG capsule Take 1 capsule (300 mg total) by mouth 2 (two) times daily. 20 capsule 0   diltiazem (CARDIZEM CD) 240 MG 24 hr capsule Take  240 mg by mouth daily.     dorzolamide (TRUSOPT) 2 % ophthalmic solution Place 1 drop into the left eye 2 (two) times daily.     fluticasone (FLONASE) 50 MCG/ACT nasal spray Place 2 sprays into both nostrils daily as needed for allergies.     gabapentin (NEURONTIN) 300 MG capsule One capsule tid and two capsules q hs. 150 capsule 2   ipratropium (ATROVENT) 0.03 % nasal spray Place 2 sprays into both nostrils 3 (three) times daily as needed for rhinitis.     latanoprost (XALATAN) 0.005 % ophthalmic solution Place 1 drop into both eyes at bedtime.      levalbuterol (XOPENEX HFA) 45 MCG/ACT inhaler Inhale 1 puff into the lungs every 6 (six) hours as needed for wheezing or shortness of breath. 1 each 1   levothyroxine (SYNTHROID) 125 MCG tablet Take 1 tablet (125 mcg total) by mouth daily. 90 tablet 3   metoprolol tartrate (LOPRESSOR) 100 MG tablet TAKE 1 AND 1/2 TABLETS(150 MG) BY MOUTH TWICE DAILY 270 tablet 0   Multiple Vitamin (MULTI-VITAMIN) tablet Take 1 tablet by mouth at bedtime.      Probiotic Product (ALIGN) 4 MG CAPS Take one capsule q day 30 capsule 0   torsemide (DEMADEX) 20 MG tablet Take 1 tablet (20 mg total) by mouth daily. 90 tablet 3   traMADol (ULTRAM) 50 MG tablet Take 1 tablet (50 mg total) by mouth 2 (two) times daily as needed. 60 tablet 0   ursodiol (ACTIGALL) 300 MG capsule Take 300-600  mg by mouth See admin instructions. Take 2 capsules (600 mg) daily in the morning   & 1 capsule (300 mg) at night.     XARELTO 15 MG TABS tablet TAKE 1 TABLET(15 MG) BY MOUTH DAILY WITH SUPPER 90 tablet 1   No current facility-administered medications on file prior to visit.    Review of Systems  Constitutional:  Negative for chills and fever.  HENT:  Positive for congestion. Negative for sinus pressure.   Respiratory:  Positive for cough, shortness of breath and wheezing.   Cardiovascular:  Negative for chest pain, palpitations and leg swelling.  Gastrointestinal:  Negative for nausea  and vomiting.      Objective:    BP 136/80   Pulse (!) 107   Temp 97.9 F (36.6 C) (Oral)   Ht 5\' 2"  (1.575 m)   Wt 139 lb 1.6 oz (63.1 kg)   SpO2 95%   BMI 25.44 kg/m   BP Readings from Last 3 Encounters:  12/19/22 136/80  12/17/22 (!) 135/90  12/16/22 130/76   Wt Readings from Last 3 Encounters:  12/19/22 139 lb 1.6 oz (63.1 kg)  12/17/22 141 lb (64 kg)  12/16/22 139 lb 12.8 oz (63.4 kg)    Physical Exam Vitals reviewed.  Constitutional:      Appearance: She is well-developed.  Eyes:     Conjunctiva/sclera: Conjunctivae normal.  Cardiovascular:     Rate and Rhythm: Normal rate and regular rhythm.     Pulses: Normal pulses.     Heart sounds: Normal heart sounds.  Pulmonary:     Effort: Pulmonary effort is normal.     Breath sounds: Normal breath sounds. No wheezing, rhonchi or rales.     Comments: Coarse lung sounds. Skin:    General: Skin is warm and dry.  Neurological:     Mental Status: She is alert.  Psychiatric:        Speech: Speech normal.        Behavior: Behavior normal.        Thought Content: Thought content normal.

## 2022-12-24 ENCOUNTER — Encounter: Payer: Self-pay | Admitting: Oncology

## 2022-12-24 ENCOUNTER — Telehealth: Payer: Self-pay | Admitting: Pharmacy Technician

## 2022-12-24 ENCOUNTER — Other Ambulatory Visit (HOSPITAL_COMMUNITY): Payer: Self-pay

## 2022-12-24 NOTE — Telephone Encounter (Signed)
Pharmacy Patient Advocate Encounter   Received notification from Fax/CoverMyMeds that prior authorization for Symbicort is required/requested.   Insurance verification completed.   The patient is insured through [facility] Medicare/[employer] [facility]) .   Per test claim:  Advair HFA, Breo Ellipta, or Elwin Sleight is preferred by the insurance.  If suggested medication is appropriate, Please send in a new RX and discontinue this one. If not, please advise as to why it's not appropriate so that we may request a Prior Authorization.  Advair HFA is $70.51; Dulera $75.75 ; Breo is 90.04  This test claim was processed through [employer]- copay amounts may vary at other pharmacies due to [employer], or as the patient moves through the different stages of their insurance plan.

## 2022-12-25 NOTE — Telephone Encounter (Signed)
LVM to call back to office

## 2022-12-25 NOTE — Telephone Encounter (Signed)
Call pt  How is she feeling?  How is cough and chest tightness?   Insurance requires PA for symbicort inhaler.   Does she feel that she needs ?   If so, I will send in a similar drug class inhaler which is on her formulary

## 2022-12-26 NOTE — Telephone Encounter (Signed)
LVM to call back.

## 2022-12-30 ENCOUNTER — Ambulatory Visit: Payer: Medicare Other

## 2022-12-30 DIAGNOSIS — I495 Sick sinus syndrome: Secondary | ICD-10-CM

## 2022-12-30 LAB — CUP PACEART REMOTE DEVICE CHECK
Battery Remaining Longevity: 84 mo
Battery Remaining Percentage: 88 %
Battery Voltage: 3.01 V
Brady Statistic AP VP Percent: 0 %
Brady Statistic AP VS Percent: 0 %
Brady Statistic AS VP Percent: 86 %
Brady Statistic AS VS Percent: 14 %
Brady Statistic RA Percent Paced: 0 %
Brady Statistic RV Percent Paced: 37 %
Date Time Interrogation Session: 20241021020013
Implantable Lead Connection Status: 753985
Implantable Lead Connection Status: 753985
Implantable Lead Implant Date: 20230721
Implantable Lead Implant Date: 20230721
Implantable Lead Location: 753859
Implantable Lead Location: 753860
Implantable Pulse Generator Implant Date: 20230721
Lead Channel Impedance Value: 460 Ohm
Lead Channel Impedance Value: 480 Ohm
Lead Channel Pacing Threshold Amplitude: 0.75 V
Lead Channel Pacing Threshold Pulse Width: 0.5 ms
Lead Channel Sensing Intrinsic Amplitude: 1.8 mV
Lead Channel Sensing Intrinsic Amplitude: 12 mV
Lead Channel Setting Pacing Amplitude: 2.5 V
Lead Channel Setting Pacing Amplitude: 3.5 V
Lead Channel Setting Pacing Pulse Width: 0.5 ms
Lead Channel Setting Sensing Sensitivity: 2 mV
Pulse Gen Model: 2272
Pulse Gen Serial Number: 8087042

## 2023-01-01 NOTE — Telephone Encounter (Signed)
Spoke to pt she is still a little stuffy and coughing but she says she is better than she was pt is ok with another inhaler being sent in that her insurance covers . Pt would also like to know if you can send in her refill for Tramadol.

## 2023-01-02 ENCOUNTER — Other Ambulatory Visit: Payer: Self-pay | Admitting: Family

## 2023-01-02 ENCOUNTER — Other Ambulatory Visit (HOSPITAL_COMMUNITY): Payer: Self-pay

## 2023-01-02 DIAGNOSIS — J40 Bronchitis, not specified as acute or chronic: Secondary | ICD-10-CM

## 2023-01-02 MED ORDER — FLUTICASONE-SALMETEROL 115-21 MCG/ACT IN AERO: 2.0000 | INHALATION_SPRAY | Freq: Two times a day (BID) | RESPIRATORY_TRACT | 1 refills | Status: DC

## 2023-01-03 MED ORDER — TRAMADOL HCL 50 MG PO TABS: 50.0000 mg | ORAL_TABLET | Freq: Two times a day (BID) | ORAL | 0 refills | Status: DC | PRN

## 2023-01-03 NOTE — Telephone Encounter (Signed)
NOTED

## 2023-01-03 NOTE — Telephone Encounter (Signed)
Rx sent in for tramadol

## 2023-01-09 NOTE — Telephone Encounter (Signed)
Error

## 2023-01-13 DIAGNOSIS — B0230 Zoster ocular disease, unspecified: Secondary | ICD-10-CM | POA: Diagnosis not present

## 2023-01-13 DIAGNOSIS — H401132 Primary open-angle glaucoma, bilateral, moderate stage: Secondary | ICD-10-CM | POA: Diagnosis not present

## 2023-01-13 DIAGNOSIS — Z961 Presence of intraocular lens: Secondary | ICD-10-CM | POA: Diagnosis not present

## 2023-01-13 DIAGNOSIS — H35373 Puckering of macula, bilateral: Secondary | ICD-10-CM | POA: Diagnosis not present

## 2023-01-16 NOTE — Progress Notes (Signed)
Remote pacemaker transmission.   

## 2023-01-20 ENCOUNTER — Ambulatory Visit
Admission: RE | Admit: 2023-01-20 | Discharge: 2023-01-20 | Disposition: A | Discharge status: Home / Self Care | Payer: Medicare Other | Source: Ambulatory Visit | Attending: Internal Medicine | Admitting: Internal Medicine

## 2023-01-20 DIAGNOSIS — Z1231 Encounter for screening mammogram for malignant neoplasm of breast: Secondary | ICD-10-CM | POA: Insufficient documentation

## 2023-01-26 NOTE — Progress Notes (Unsigned)
Cardiology Office Note Date:  01/27/2023  Patient ID:  Ruth, Roth 01-02-1929, MRN [id number] PCP:  Dale Bellefonte, MD  Cardiologist:  Lorine Bears, MD HF Cardiologist: Arvilla Meres, MD Electrophysiologist: Lanier Prude, MD     Chief Complaint: 1 year PPM follow-up  History of Present Illness: Ruth Roth is a 89 y.o. female with PMH notable for perm afib, tachy-brady s/p PPM, CHF, severe MR, HTN, CKD-3b; seen today for Lanier Prude, MD for routine electrophysiology followup.   Since last being seen in our clinic the patient reports doing about the same. She continues to have intermittent SOB with activity. Some days worse than others, but overall is stable.  Continues to take xarelto daily, no missed doses, no bleeding concerns.   .  she denies chest pain, palpitations, PND, orthopnea, nausea, vomiting, dizziness, syncope, edema, weight gain, or early satiety.     Device Information: [employer] Jude dual chamber PPM, imp 09/2021; dx SSS  AAD History: Amiodarone, ineffective  Past Medical History:  Diagnosis Date   (HFpEF) heart failure with preserved ejection fraction (HCC)    a. 07/2017 Echo: EF 50-55%, no rwma, mild AS/AI, mod MR, mod dil LA, mod TR, PASP ; b. 03/2021 Echo: EF 60-65%. No rwma. Nl RV fxn. Sev BAE. Mild MR. Mild AI/AS.   Autoimmune hepatitis (HCC)    followed by Dr Marva Panda   Fibrocystic breast disease    Glaucoma    Hypercholesterolemia    Hypertension    Hypothyroidism    Inflammatory arthritis    MI (myocardial infarction) (HCC)    Mitral regurgitation    a. 07/2017 Echo: Mod MR; b. 08/2017 Cath: 4+/Severe MR; c. 08/2017 TEE: Mild MR; d. 03/2021 Echo: Mild MR.   Neuropathy    Non-obstructive Coronary artery disease    a. 05/2014 NSTEMI/Cath: LM nl, LAD mild dzs, LCX 60ost hazy (? culprit), RCA mild dzs. EF 60% by echo-->Med Rx; b. 08/2017 Cath: LM 30ost, LAD min irregs, LCX small, 40ost/p, RCA large, min irregs, RPDA/RPL1 nl, EF  50-55%. 4+MR.   Osteoarthritis    Permanent atrial fibrillation (HCC)    a. CHA2DS2VASc = 6-->Xarelto.   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    Past Surgical History:  Procedure Laterality Date   ABDOMINAL HYSTERECTOMY  1963   partial, secondary to fibroids   APPENDECTOMY     Billroth     CARDIAC CATHETERIZATION  05/12/14   [facility]   CARDIOVERSION N/A 12/26/2016   Procedure: CARDIOVERSION;  Surgeon: Iran Ouch, MD;  Location: [facility] ORS;  Service: Cardiovascular;  Laterality: N/A;   CARDIOVERSION N/A 05/16/2017   Procedure: CARDIOVERSION;  Surgeon: Iran Ouch, MD;  Location: [facility] ORS;  Service: Cardiovascular;  Laterality: N/A;   CARDIOVERSION N/A 06/09/2017   Procedure: CARDIOVERSION;  Surgeon: Iran Ouch, MD;  Location: [facility] ORS;  Service: Cardiovascular;  Laterality: N/A;   CARDIOVERSION N/A 11/30/2018   Procedure: CARDIOVERSION;  Surgeon: Iran Ouch, MD;  Location: [facility] ORS;  Service: Cardiovascular;  Laterality: N/A;   CHOLECYSTECTOMY  1998   COLONOSCOPY  2009   PACEMAKER IMPLANT N/A 09/28/2021   Procedure: PACEMAKER IMPLANT;  Surgeon: Lanier Prude, MD;  Location: [facility] INVASIVE CV LAB;  Service: Cardiovascular;  Laterality: N/A;   RIGHT/LEFT HEART CATH AND CORONARY ANGIOGRAPHY Bilateral 08/21/2017   Procedure: RIGHT/LEFT HEART CATH AND CORONARY ANGIOGRAPHY;  Surgeon: Iran Ouch, MD;  Location: [facility] INVASIVE CV LAB;  Service: Cardiovascular;  Laterality: Bilateral;   TEE WITHOUT CARDIOVERSION N/A 08/25/2017   Procedure: TRANSESOPHAGEAL ECHOCARDIOGRAM (TEE);  Surgeon: Dolores Patty, MD;  Location: [facility] ENDOSCOPY;  Service: Cardiovascular;  Laterality: N/A;   UPPER GI ENDOSCOPY  2004    Current Outpatient Medications  Medication Instructions   cefdinir (OMNICEF) 300 mg, Oral, 2 times daily   diltiazem (CARDIZEM CD) 240 mg, Oral, Daily   dorzolamide (TRUSOPT) 2 % ophthalmic solution 1 drop,  Left Eye, 2 times daily   fluticasone (FLONASE) 50 MCG/ACT nasal spray 2 sprays, Each Nare, Daily PRN   fluticasone-salmeterol (ADVAIR HFA) 115-21 MCG/ACT inhaler 2 puffs, Inhalation, 2 times daily   gabapentin (NEURONTIN) 300 MG capsule One capsule tid and two capsules q hs.   ipratropium (ATROVENT) 0.03 % nasal spray 2 sprays, Each Nare, 3 times daily PRN   latanoprost (XALATAN) 0.005 % ophthalmic solution 1 drop, Both Eyes, Daily at bedtime   levalbuterol (XOPENEX HFA) 45 MCG/ACT inhaler 1 puff, Inhalation, Every 6 hours PRN   levothyroxine (SYNTHROID) 125 mcg, Oral, Daily   metoprolol tartrate (LOPRESSOR) 100 MG tablet TAKE 1 AND 1/2 TABLETS(150 MG) BY MOUTH TWICE DAILY   Multiple Vitamin (MULTI-VITAMIN) tablet 1 tablet, Oral, Daily at bedtime   Probiotic Product (ALIGN) 4 MG CAPS Take one capsule q day   torsemide (DEMADEX) 20 mg, Oral, Daily   traMADol (ULTRAM) 50 mg, Oral, 2 times daily PRN   ursodiol (ACTIGALL) 300-600 mg, Oral, See admin instructions, Take 2 capsules (600 mg) daily in the morning   & 1 capsule (300 mg) at night.   XARELTO 15 MG TABS tablet TAKE 1 TABLET(15 MG) BY MOUTH DAILY WITH SUPPER    Social History:  The patient  reports that she has never smoked. She has never used smokeless tobacco. She reports that she does not drink alcohol and does not use drugs.   Family History:   The patient's family history includes Colon cancer in her daughter; Esophageal cancer in her brother; Ovarian cancer in her daughter; Stroke in her mother.  ROS:  Please see the history of present illness. All other systems are reviewed and otherwise negative.   PHYSICAL EXAM:  VS:  BP 136/76 (BP Location: Left Arm, Patient Position: Sitting)   Pulse 81   Ht 5\' 2"  (1.575 m)   Wt 144 lb (65.3 kg)   SpO2 96%   BMI 26.34 kg/m  BMI: Body mass index is 26.34 kg/m.  GEN- The patient is well appearing, alert and oriented x 3 today.   Lungs- Clear to ausculation bilaterally, normal work of  breathing.  Heart- Regularly irregular rate and rhythm, no murmurs, rubs or gallops Extremities- Trace peripheral edema, warm, dry Skin-  device pocket well-healed, no tethering   Device interrogation done today and reviewed by myself:  Battery 8.3 years [place] AFib noted Lead thresholds, impedence, sensing stable  Freq AMS episodes Adjusted mode to VVI d/t perm afib No further changes made today  EKG is ordered. Personal review of EKG from today shows:    EKG Interpretation Date/Time:  Monday January 27 2023 11:00:21 EST Ventricular Rate:  73 PR Interval:    QRS Duration:  82 QT Interval:  416 QTC Calculation: 458 R Axis:   43  Text Interpretation: Atrial fibrillation with ventricular-paced complexes Low voltage QRS Confirmed by Sherie Don [id number]) on 01/27/2023 11:08:23 AM    Recent Labs: 04/02/2022: TSH 3.41 11/18/2022: BNP 634.6 12/04/2022: ALT 18; BUN 34; Creatinine, Ser 1.07; Hemoglobin 12.3; Platelets  239; Potassium 4.5; Sodium 138  10/14/2022: Cholesterol 167; HDL 66.40; LDL Cholesterol 82; Total CHOL/HDL Ratio 3; Triglycerides 91.0; VLDL 18.2   CrCl cannot be calculated (Patient's most recent lab result is older than the maximum 21 days allowed.).   Wt Readings from Last 3 Encounters:  01/27/23 144 lb (65.3 kg)  12/19/22 139 lb 1.6 oz (63.1 kg)  12/17/22 141 lb (64 kg)     Additional studies reviewed include: Previous EP, cardiology notes.   Long term monitor, 04/29/2022 1. Atrial Flutter occurred continuously (100% burden), ranging from 68-159 bpm (avg of 80 bpm).  2. No other events noted  TTE, 04/04/2022   1. Left ventricular ejection fraction, by estimation, is 60 to 65%. The left ventricle has normal function. The left ventricle has no regional wall motion abnormalities. Left ventricular diastolic parameters are indeterminate.   2. Right ventricular systolic function is normal. The right ventricular size is normal.     3. Left atrial size was severely  dilated.   4. Right atrial size was severely dilated.   5. The mitral valve is degenerative. Mild mitral valve regurgitation. No  evidence of mitral stenosis. Moderate mitral annular calcification.   6. Tricuspid valve regurgitation is mild to moderate.   7. The aortic valve is tricuspid. There is moderate calcification of the aortic valve. Aortic valve regurgitation is mild. Aortic valve sclerosis/calcification is present, without any evidence of aortic stenosis.   8. The inferior vena cava is dilated in size with <50% respiratory variability, suggesting right atrial pressure of 15 mmHg.    ASSESSMENT AND PLAN:  #) perm afib #) tachy-brady s/p PPM Ventricular rates generally well-controlled Continue 240mg  dilt, 150 lopressor BID Consider amiodarone for rate control in future  #) Hypercoag d/t perm afib CHA2DS2-VASc Score = 6 [CHF History: 1, HTN History: 1, Diabetes History: 0, Stroke History: 0, Vascular Disease History: 1, Age Score: 2, Gender Score: 1].  Therefore, the patient's annual risk of stroke is 9.7 %.    Stroke ppx - 15mg  xarelto, dose appropriate reduced for CrCl <39ml/min No bleeding concerns  #) diastolic HF Well-compensated with stable DOE Follows regularly with Adv HF team         Current medicines are reviewed at length with the patient today.   The patient does not have concerns regarding her medicines.  The following changes were made today:  none  Labs/ tests ordered today include:  Orders Placed This Encounter  Procedures   EKG 12-Lead     Disposition: Follow up with Dr. Lalla Brothers or EP APP in 6 months   Signed, Sherie Don, NP  01/27/23  12:19 PM  Electrophysiology [facility]

## 2023-01-27 ENCOUNTER — Ambulatory Visit: Payer: Medicare Other | Attending: Cardiology | Admitting: Cardiology

## 2023-01-27 ENCOUNTER — Encounter: Payer: Self-pay | Admitting: Cardiology

## 2023-01-27 VITALS — BP 136/76 | HR 81 | Ht 62.0 in | Wt 144.0 lb

## 2023-01-27 DIAGNOSIS — I495 Sick sinus syndrome: Secondary | ICD-10-CM | POA: Diagnosis not present

## 2023-01-27 DIAGNOSIS — I4821 Permanent atrial fibrillation: Secondary | ICD-10-CM | POA: Insufficient documentation

## 2023-01-27 DIAGNOSIS — D6869 Other thrombophilia: Secondary | ICD-10-CM | POA: Insufficient documentation

## 2023-01-27 DIAGNOSIS — Z950 Presence of cardiac pacemaker: Secondary | ICD-10-CM | POA: Insufficient documentation

## 2023-01-27 DIAGNOSIS — I5032 Chronic diastolic (congestive) heart failure: Secondary | ICD-10-CM | POA: Insufficient documentation

## 2023-01-27 LAB — CUP PACEART INCLINIC DEVICE CHECK
Battery Remaining Longevity: 99 mo
Battery Voltage: 2.99 V
Brady Statistic RA Percent Paced: 0 %
Brady Statistic RV Percent Paced: 37 %
Date Time Interrogation Session: 20241118123147
Implantable Lead Connection Status: 753985
Implantable Lead Connection Status: 753985
Implantable Lead Implant Date: 20230721
Implantable Lead Implant Date: 20230721
Implantable Lead Location: 753859
Implantable Lead Location: 753860
Implantable Pulse Generator Implant Date: 20230721
Lead Channel Impedance Value: 425 Ohm
Lead Channel Impedance Value: 450 Ohm
Lead Channel Pacing Threshold Amplitude: 0.75 V
Lead Channel Pacing Threshold Amplitude: 0.75 V
Lead Channel Pacing Threshold Pulse Width: 0.5 ms
Lead Channel Pacing Threshold Pulse Width: 0.5 ms
Lead Channel Sensing Intrinsic Amplitude: 11.4 mV
Lead Channel Sensing Intrinsic Amplitude: 2.4 mV
Lead Channel Setting Pacing Amplitude: 2.5 V
Lead Channel Setting Pacing Pulse Width: 0.5 ms
Lead Channel Setting Sensing Sensitivity: 2 mV
Pulse Gen Model: 2272
Pulse Gen Serial Number: 8087042

## 2023-01-27 NOTE — Patient Instructions (Signed)
Medication Instructions:  The current medical regimen is effective;  continue present plan and medications.  *If you need a refill on your cardiac medications before your next appointment, please call your pharmacy*   Follow-Up: At [facility], you and your health needs are our priority.  As part of our continuing mission to provide you with exceptional heart care, we have created designated Provider Care Teams.  These Care Teams include your primary Cardiologist (physician) and Advanced Practice Providers (APPs -  Physician Assistants and Nurse Practitioners) who all work together to provide you with the care you need, when you need it.  We recommend signing up for the patient portal called "MyChart".  Sign up information is provided on this After Visit Summary.  MyChart is used to connect with patients for Virtual Visits (Telemedicine).  Patients are able to view lab/test results, encounter notes, upcoming appointments, etc.  Non-urgent messages can be sent to your provider as well.   To learn more about what you can do with MyChart, go to [email or web address].    Your next appointment:   6 month(s)  Provider:   Steffanie Dunn, MD or Sherie Don, NP

## 2023-02-03 ENCOUNTER — Ambulatory Visit: Payer: Medicare Other | Admitting: Internal Medicine

## 2023-02-03 ENCOUNTER — Other Ambulatory Visit: Payer: Self-pay | Admitting: Internal Medicine

## 2023-02-03 ENCOUNTER — Encounter: Payer: Self-pay | Admitting: Internal Medicine

## 2023-02-03 VITALS — BP 132/78 | HR 108 | Temp 98.0°F | Resp 16 | Ht 62.0 in | Wt 140.2 lb

## 2023-02-03 DIAGNOSIS — I5032 Chronic diastolic (congestive) heart failure: Secondary | ICD-10-CM | POA: Diagnosis not present

## 2023-02-03 DIAGNOSIS — N1832 Chronic kidney disease, stage 3b: Secondary | ICD-10-CM | POA: Diagnosis not present

## 2023-02-03 DIAGNOSIS — I495 Sick sinus syndrome: Secondary | ICD-10-CM

## 2023-02-03 DIAGNOSIS — E7800 Pure hypercholesterolemia, unspecified: Secondary | ICD-10-CM

## 2023-02-03 DIAGNOSIS — E063 Autoimmune thyroiditis: Secondary | ICD-10-CM

## 2023-02-03 DIAGNOSIS — I25118 Atherosclerotic heart disease of native coronary artery with other forms of angina pectoris: Secondary | ICD-10-CM

## 2023-02-03 DIAGNOSIS — I4819 Other persistent atrial fibrillation: Secondary | ICD-10-CM

## 2023-02-03 DIAGNOSIS — K743 Primary biliary cirrhosis: Secondary | ICD-10-CM

## 2023-02-03 DIAGNOSIS — K754 Autoimmune hepatitis: Secondary | ICD-10-CM

## 2023-02-03 DIAGNOSIS — D631 Anemia in chronic kidney disease: Secondary | ICD-10-CM

## 2023-02-03 DIAGNOSIS — I340 Nonrheumatic mitral (valve) insufficiency: Secondary | ICD-10-CM

## 2023-02-03 DIAGNOSIS — N189 Chronic kidney disease, unspecified: Secondary | ICD-10-CM

## 2023-02-03 DIAGNOSIS — K7460 Unspecified cirrhosis of liver: Secondary | ICD-10-CM

## 2023-02-03 DIAGNOSIS — E785 Hyperlipidemia, unspecified: Secondary | ICD-10-CM | POA: Diagnosis not present

## 2023-02-03 DIAGNOSIS — Z23 Encounter for immunization: Secondary | ICD-10-CM | POA: Diagnosis not present

## 2023-02-03 DIAGNOSIS — E039 Hypothyroidism, unspecified: Secondary | ICD-10-CM | POA: Diagnosis not present

## 2023-02-03 DIAGNOSIS — G6289 Other specified polyneuropathies: Secondary | ICD-10-CM

## 2023-02-03 DIAGNOSIS — I10 Essential (primary) hypertension: Secondary | ICD-10-CM | POA: Diagnosis not present

## 2023-02-03 DIAGNOSIS — I272 Pulmonary hypertension, unspecified: Secondary | ICD-10-CM

## 2023-02-03 DIAGNOSIS — D329 Benign neoplasm of meninges, unspecified: Secondary | ICD-10-CM

## 2023-02-03 NOTE — Progress Notes (Unsigned)
Subjective:    [name], female    DOB: 26-Oct-1928, 89 y.o.   MRN: [id number]  Patient here for  Chief Complaint  Patient presents with   Medical Management of Chronic Issues    HPI Here to follow up regarding CAD, chronic diastolic heart failure and hypertension. S/p pacemaker placement in July. Saw Dr Cathie Hoops - 09/23/22 - f/u anemia. IV venofer recommended. Had f/u with nephrology 11/04/22.  Stable. Seeing Dr Tedd Sias - last 11/05/22 - prolia. Recently treated for bronchitis. Symptoms resolved. Saw cardiology 12/17/22 - stable.  Continue xarelto. Heart rate overall well controlled but still with bursts of RVR.  No changes made.  Felt rate control adequate. Reevaluated 01/27/23 - no changes.  Continue 240mg  dilt and 150mg  bid lopressor.  Feels breathing is stable. No chest pain.  No nausea or vomiting reported.    Past Medical History:  Diagnosis Date   (HFpEF) heart failure with preserved ejection fraction (HCC)    a. 07/2017 Echo: EF 50-55%, no rwma, mild AS/AI, mod MR, mod dil LA, mod TR, PASP ; b. 03/2021 Echo: EF 60-65%. No rwma. Nl RV fxn. Sev BAE. Mild MR. Mild AI/AS.   Autoimmune hepatitis (HCC)    followed by Dr Marva Panda   Fibrocystic breast disease    Glaucoma    Hypercholesterolemia    Hypertension    Hypothyroidism    Inflammatory arthritis    MI (myocardial infarction) (HCC)    Mitral regurgitation    a. 07/2017 Echo: Mod MR; b. 08/2017 Cath: 4+/Severe MR; c. 08/2017 TEE: Mild MR; d. 03/2021 Echo: Mild MR.   Neuropathy    Non-obstructive Coronary artery disease    a. 05/2014 NSTEMI/Cath: LM nl, LAD mild dzs, LCX 60ost hazy (? culprit), RCA mild dzs. EF 60% by echo-->Med Rx; b. 08/2017 Cath: LM 30ost, LAD min irregs, LCX small, 40ost/p, RCA large, min irregs, RPDA/RPL1 nl, EF 50-55%. 4+MR.   Osteoarthritis    Permanent atrial fibrillation (HCC)    a. CHA2DS2VASc = 6-->Xarelto.   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   Past Surgical History:  Procedure Laterality Date   ABDOMINAL HYSTERECTOMY  1963   partial, secondary to fibroids   APPENDECTOMY     Billroth     CARDIAC CATHETERIZATION  05/12/14   [facility]   CARDIOVERSION N/A 12/26/2016   Procedure: CARDIOVERSION;  Surgeon: Iran Ouch, MD;  Location: [facility] ORS;  Service: Cardiovascular;  Laterality: N/A;   CARDIOVERSION N/A 05/16/2017   Procedure: CARDIOVERSION;  Surgeon: Iran Ouch, MD;  Location: [facility];  Service: Cardiovascular;  Laterality: N/A;   CARDIOVERSION N/A 06/09/2017   Procedure: CARDIOVERSION;  Surgeon: Iran Ouch, MD;  Location: [facility];  Service: Cardiovascular;  Laterality: N/A;   CARDIOVERSION N/A 11/30/2018   Procedure: CARDIOVERSION;  Surgeon: Iran Ouch, MD;  Location: [facility];  Service: Cardiovascular;  Laterality: N/A;   CHOLECYSTECTOMY  1998   COLONOSCOPY  2009   PACEMAKER IMPLANT N/A 09/28/2021   Procedure: PACEMAKER IMPLANT;  Surgeon: Lanier Prude, MD;  Location: [facility];  Service: Cardiovascular;  Laterality: N/A;   RIGHT/LEFT HEART CATH AND CORONARY ANGIOGRAPHY Bilateral 08/21/2017   Procedure: RIGHT/LEFT HEART CATH AND CORONARY ANGIOGRAPHY;  Surgeon: Iran Ouch, MD;  Location: [facility];  Service: Cardiovascular;  Laterality: Bilateral;   TEE WITHOUT CARDIOVERSION N/A 08/25/2017   Procedure: TRANSESOPHAGEAL ECHOCARDIOGRAM (TEE);  Surgeon: Dolores Patty,  MD;  Location: [facility];  Service: Cardiovascular;  Laterality: N/A;   UPPER GI ENDOSCOPY  2004   Family History  Problem Relation Age of Onset   Stroke Mother    Esophageal cancer Brother        also had lung cancer   Ovarian cancer Daughter    Colon cancer Daughter    Breast cancer Neg Hx    Social History   Socioeconomic History   Marital status: Widowed    Spouse name: Not on file   Number of children: 2   Years of education: Not on file   Highest education level: Not on file   Occupational History   Occupation: retired  Tobacco Use   Smoking status: Never   Smokeless tobacco: Never   Tobacco comments:    Never smoke 01/23/22  Vaping Use   Vaping status: Never Used  Substance and Sexual Activity   Alcohol use: No    Alcohol/week: 0.0 standard drinks of alcohol   Drug use: No   Sexual activity: Never  Other Topics Concern   Not on file  Social History Narrative   Not on file   Social Determinants of Health   Financial Resource Strain: Low Risk  (06/17/2022)   Overall Financial Resource Strain (CARDIA)    Difficulty of Paying Living Expenses: Not hard at all  Food Insecurity: No Food Insecurity (06/17/2022)   Hunger Vital Sign    Worried About Running Out of Food in the Last Year: Never true    Ran Out of Food in the Last Year: Never true  Transportation Needs: No Transportation Needs (06/17/2022)   PRAPARE - Administrator, Civil Service (Medical): No    Lack of Transportation (Non-Medical): No  Physical Activity: Insufficiently Active (06/17/2022)   Exercise Vital Sign    Days of Exercise per Week: 3 days    Minutes of Exercise per Session: 40 min  Stress: No Stress Concern Present (06/17/2022)   [employer] of Occupational Health - Occupational Stress Questionnaire    Feeling of Stress : Not at all  Social Connections: Unknown (06/17/2022)   Social Connection and Isolation Panel [NHANES]    Frequency of Communication with Friends and Family: More than three times a week    Frequency of Social Gatherings with Friends and Family: More than three times a week    Attends Religious Services: Not on Marketing executive or Organizations: Not on file    Attends Banker Meetings: Not on file    Marital Status: Not on file     Review of Systems  Constitutional:  Negative for appetite change and unexpected weight change.  HENT:  Negative for congestion and sinus pressure.   Respiratory:  Negative for cough and chest  tightness.        Breathing stable.   Cardiovascular:  Negative for chest pain.       No increased swelling.  Occasional increased heart rate.   Gastrointestinal:  Negative for abdominal pain, nausea and vomiting.  Genitourinary:  Negative for difficulty urinating and dysuria.  Musculoskeletal:  Negative for joint swelling and myalgias.  Skin:  Negative for color change and rash.  Neurological:  Negative for dizziness and headaches.  Psychiatric/Behavioral:  Negative for agitation and dysphoric mood.        Objective:     BP 132/78   Pulse (!) 104   Temp 98 F (36.7 C)   Resp 16  Ht 5\' 2"  (1.575 m)   Wt 140 lb 3.2 oz (63.6 kg)   SpO2 96%   BMI 25.64 kg/m  Wt Readings from Last 3 Encounters:  02/03/23 140 lb 3.2 oz (63.6 kg)  01/27/23 144 lb (65.3 kg)  12/19/22 139 lb 1.6 oz (63.1 kg)    Physical Exam Vitals reviewed.  Constitutional:      General: She is not in acute distress.    Appearance: Normal appearance.  HENT:     Head: Normocephalic and atraumatic.     Right Ear: External ear normal.     Left Ear: External ear normal.  Eyes:     General: No scleral icterus.       Right eye: No discharge.        Left eye: No discharge.     Conjunctiva/sclera: Conjunctivae normal.  Neck:     Thyroid: No thyromegaly.  Cardiovascular:     Rate and Rhythm: Normal rate.     Comments: Irregular.  Rate 104.  Pulmonary:     Effort: No respiratory distress.     Breath sounds: Normal breath sounds. No wheezing.  Abdominal:     General: Bowel sounds are normal.     Palpations: Abdomen is soft.     Tenderness: There is no abdominal tenderness.  Musculoskeletal:        General: No swelling or tenderness.     Cervical back: Neck supple. No tenderness.  Lymphadenopathy:     Cervical: No cervical adenopathy.  Skin:    Findings: No erythema or rash.  Neurological:     Mental Status: She is alert.  Psychiatric:        Mood and Affect: Mood normal.        Behavior: Behavior  normal.      Outpatient Encounter Medications as of 02/03/2023  Medication Sig   diltiazem (CARDIZEM CD) 240 MG 24 hr capsule Take 240 mg by mouth daily.   dorzolamide (TRUSOPT) 2 % ophthalmic solution Place 1 drop into the left eye 2 (two) times daily.   fluticasone (FLONASE) 50 MCG/ACT nasal spray Place 2 sprays into both nostrils daily as needed for allergies.   fluticasone-salmeterol (ADVAIR HFA) 115-21 MCG/ACT inhaler Inhale 2 puffs into the lungs 2 (two) times daily.   gabapentin (NEURONTIN) 300 MG capsule One capsule tid and two capsules q hs.   ipratropium (ATROVENT) 0.03 % nasal spray Place 2 sprays into both nostrils 3 (three) times daily as needed for rhinitis.   latanoprost (XALATAN) 0.005 % ophthalmic solution Place 1 drop into both eyes at bedtime.    levalbuterol (XOPENEX HFA) 45 MCG/ACT inhaler Inhale 1 puff into the lungs every 6 (six) hours as needed for wheezing or shortness of breath.   levothyroxine (SYNTHROID) 125 MCG tablet Take 1 tablet (125 mcg total) by mouth daily.   metoprolol tartrate (LOPRESSOR) 100 MG tablet TAKE 1 AND 1/2 TABLETS(150 MG) BY MOUTH TWICE DAILY   Multiple Vitamin (MULTI-VITAMIN) tablet Take 1 tablet by mouth at bedtime.    Probiotic Product (ALIGN) 4 MG CAPS Take one capsule q day   torsemide (DEMADEX) 20 MG tablet Take 1 tablet (20 mg total) by mouth daily.   traMADol (ULTRAM) 50 MG tablet Take 1 tablet (50 mg total) by mouth 2 (two) times daily as needed.   ursodiol (ACTIGALL) 300 MG capsule Take 300-600 mg by mouth See admin instructions. Take 2 capsules (600 mg) daily in the morning   & 1 capsule (300 mg) at  night.   [DISCONTINUED] cefdinir (OMNICEF) 300 MG capsule Take 1 capsule (300 mg total) by mouth 2 (two) times daily.   [DISCONTINUED] XARELTO 15 MG TABS tablet TAKE 1 TABLET(15 MG) BY MOUTH DAILY WITH SUPPER   No facility-administered encounter medications on file as of 02/03/2023.     Lab Results  Component Value Date   WBC 5.5  12/04/2022   HGB 12.3 12/04/2022   HCT 40.6 12/04/2022   PLT 239 12/04/2022   GLUCOSE 68 (L) 02/03/2023   CHOL 162 02/03/2023   TRIG 161.0 (H) 02/03/2023   HDL 48.90 02/03/2023   LDLDIRECT 143.5 12/17/2012   LDLCALC 81 02/03/2023   ALT 13 02/03/2023   AST 15 02/03/2023   NA 137 02/03/2023   K 5.1 02/03/2023   CL 103 02/03/2023   CREATININE 1.34 (H) 02/03/2023   BUN 37 (H) 02/03/2023   CO2 25 02/03/2023   TSH 6.64 (H) 02/03/2023   INR 1.63 12/29/2017   HGBA1C 5.6 01/27/2018    MM 3D SCREENING MAMMOGRAM BILATERAL BREAST  Result Date: 01/22/2023 CLINICAL DATA:  Screening. EXAM: DIGITAL SCREENING BILATERAL MAMMOGRAM WITH TOMOSYNTHESIS AND CAD TECHNIQUE: Bilateral screening digital craniocaudal and mediolateral oblique mammograms were obtained. Bilateral screening digital breast tomosynthesis was performed. The images were evaluated with computer-aided detection. COMPARISON:  Previous exam(s). ACR Breast Density Category c: The breasts are heterogeneously dense, which may obscure small masses. FINDINGS: There are no findings suspicious for malignancy. IMPRESSION: No mammographic evidence of malignancy. A result letter of this screening mammogram will be mailed directly to the patient. RECOMMENDATION: Screening mammogram in one year (as long as the patient has a life expectancy of 10+ years). (Code:SM-B-01Y) BI-RADS CATEGORY  1: Negative. Electronically Signed   By: Hulan Saas M.D.   On: 01/22/2023 12:50       Assessment & Plan:  Hyperlipidemia, unspecified hyperlipidemia type -     Lipid panel -     Hepatic function panel -     Basic metabolic panel -     TSH  Need for influenza vaccination -     Flu Vaccine Trivalent High Dose (Fluad)  Thyroiditis, lymphocytic Assessment & Plan: Dr Gerrit Friends 11/21/22 - stable.  F/u one year.  Continue synthroid   Tachy-brady syndrome [facility]) Assessment & Plan: S/p pacemaker placement 09/2021.    Severe mitral regurgitation Assessment &  Plan: No evidence of volume overload.  Continue torsemide.    Pulmonary hypertension, unspecified (HCC) Assessment & Plan: Followed by cardiology.    Primary biliary cholangitis (HCC) Assessment & Plan: Followed by GI. Stable. Follow liver function tests.     Persistent atrial fibrillation (HCC) Assessment & Plan: Followed by cardiology - will notice heart rate is up at times.  No chest pain or increased sob.  Recommended continuing current medication regimen. Overall she feels is stable.  Follow. Just saw cardiology.  Continue dilt 240mg  q day and lopressor 150mg  bid.    Anemia in chronic kidney disease, unspecified CKD stage Assessment & Plan: Saw hematology 09/2022 - iron def.  IV iron.    Atherosclerotic heart disease of native coronary artery with other forms of angina pectoris [facility]) Assessment & Plan: Continue risk factor modification.  Continue metoprolol and xarelto. Overall stable.    Autoimmune hepatitis (HCC) Assessment & Plan: Follow liver function tests.  Followed by GI -  stable on ursodiol.  F/u abdominal ultrasound - Subtle nodularity along the free edge of the liver is stable,  consistent with the history of cirrhosis. No  focal hepatic lesions  are present. Mild increased echogenicity of the liver, stable.   Benign meningioma [facility]) Assessment & Plan: Evaluated by Dr Dwyane Luo (oncology [facility]).  States was told no further w/up or f/u neded.  Of note, no mention on MRI 2018.    Benign hypertension Assessment & Plan: On lopressor and diltiazem.  Now taking torsemide daily. Follow pressures. Hold on making any changes.    Chronic diastolic heart failure (HCC) Assessment & Plan: Discussed weighing daily.  Taking torsemide daily.  Breathing stable.    Cirrhosis of liver without ascites, unspecified hepatic cirrhosis type (HCC) Assessment & Plan: Stable on ursodiol.  F/u abdominal ultrasound - Subtle nodularity along the free edge of the liver is  stable,  consistent with the history of cirrhosis. No focal hepatic lesions  are present. Mild increased echogenicity of the liver, stable.    Stage 3b chronic kidney disease (HCC) Assessment & Plan:  Avoid antiinflammatories.  Follow metabolic panel. Followed by nephrology.     Hypercholesteremia Assessment & Plan: Unable to take statin medication.  Follow lipid panel.    Hypothyroidism, unspecified type Assessment & Plan: On thyroid replacement.  Follow tsh.     Other polyneuropathy Assessment & Plan: On gabapentin.       Dale [place], MD

## 2023-02-04 ENCOUNTER — Encounter: Payer: Self-pay | Admitting: Internal Medicine

## 2023-02-04 ENCOUNTER — Other Ambulatory Visit: Payer: Self-pay | Admitting: Internal Medicine

## 2023-02-04 DIAGNOSIS — I4819 Other persistent atrial fibrillation: Secondary | ICD-10-CM

## 2023-02-04 LAB — HEPATIC FUNCTION PANEL
ALT: 13 U/L (ref 0–35)
AST: 15 U/L (ref 0–37)
Albumin: 4 g/dL (ref 3.5–5.2)
Alkaline Phosphatase: 78 U/L (ref 39–117)
Bilirubin, Direct: 0.1 mg/dL (ref 0.0–0.3)
Total Bilirubin: 0.4 mg/dL (ref 0.2–1.2)
Total Protein: 6.6 g/dL (ref 6.0–8.3)

## 2023-02-04 LAB — BASIC METABOLIC PANEL
BUN: 37 mg/dL — ABNORMAL HIGH (ref 6–23)
CO2: 25 meq/L (ref 19–32)
Calcium: 9.1 mg/dL (ref 8.4–10.5)
Chloride: 103 meq/L (ref 96–112)
Creatinine, Ser: 1.34 mg/dL — ABNORMAL HIGH (ref 0.40–1.20)
GFR: 34.38 mL/min — ABNORMAL LOW (ref >60.00)
Glucose, Bld: 68 mg/dL — ABNORMAL LOW (ref 70–99)
Potassium: 5.1 meq/L (ref 3.5–5.1)
Sodium: 137 meq/L (ref 135–145)

## 2023-02-04 LAB — TSH: TSH: 6.64 u[IU]/mL — ABNORMAL HIGH (ref 0.35–5.50)

## 2023-02-04 LAB — LIPID PANEL
Cholesterol: 162 mg/dL (ref 0–200)
HDL: 48.9 mg/dL (ref >39.00)
LDL Cholesterol: 81 mg/dL (ref 0–99)
NonHDL: 113.47
Total CHOL/HDL Ratio: 3
Triglycerides: 161 mg/dL — ABNORMAL HIGH (ref 0.0–149.0)
VLDL: 32.2 mg/dL (ref 0.0–40.0)

## 2023-02-04 NOTE — Assessment & Plan Note (Signed)
Continue risk factor modification.  Continue metoprolol and xarelto. Overall stable.

## 2023-02-04 NOTE — Assessment & Plan Note (Signed)
On gabapentin.

## 2023-02-04 NOTE — Assessment & Plan Note (Signed)
On thyroid replacement.  Follow tsh.  

## 2023-02-04 NOTE — Assessment & Plan Note (Signed)
Stable on ursodiol.  F/u abdominal ultrasound - Subtle nodularity along the free edge of the liver is stable,  consistent with the history of cirrhosis. No focal hepatic lesions  are present. Mild increased echogenicity of the liver, stable.

## 2023-02-04 NOTE — Assessment & Plan Note (Signed)
Followed by cardiology

## 2023-02-04 NOTE — Assessment & Plan Note (Signed)
Avoid antiinflammatories.  Follow metabolic panel. Followed by nephrology.

## 2023-02-04 NOTE — Assessment & Plan Note (Signed)
Dr Gerrit Friends 11/21/22 - stable.  F/u one year.  Continue synthroid

## 2023-02-04 NOTE — Assessment & Plan Note (Signed)
Evaluated by Dr Dwyane Luo (oncology [facility]).  States was told no further w/up or f/u neded.  Of note, no mention on MRI 2018.

## 2023-02-04 NOTE — Assessment & Plan Note (Signed)
No evidence of volume overload.  Continue torsemide.

## 2023-02-04 NOTE — Assessment & Plan Note (Signed)
On lopressor and diltiazem.  Now taking torsemide daily. Follow pressures. Hold on making any changes.

## 2023-02-04 NOTE — Assessment & Plan Note (Signed)
S/p pacemaker placement 09/2021.

## 2023-02-04 NOTE — Assessment & Plan Note (Signed)
Unable to take statin medication.  Follow lipid panel.

## 2023-02-04 NOTE — Assessment & Plan Note (Signed)
Followed by GI.  Stable.  Follow liver function tests.

## 2023-02-04 NOTE — Assessment & Plan Note (Signed)
Followed by cardiology - will notice heart rate is up at times.  No chest pain or increased sob.  Recommended continuing current medication regimen. Overall she feels is stable.  Follow. Just saw cardiology.  Continue dilt 240mg  q day and lopressor 150mg  bid.

## 2023-02-04 NOTE — Assessment & Plan Note (Signed)
Follow liver function tests.  Followed by GI -  stable on ursodiol.  F/u abdominal ultrasound - Subtle nodularity along the free edge of the liver is stable,  consistent with the history of cirrhosis. No focal hepatic lesions  are present. Mild increased echogenicity of the liver, stable.

## 2023-02-04 NOTE — Assessment & Plan Note (Signed)
Discussed weighing daily.  Taking torsemide daily.  Breathing stable.

## 2023-02-04 NOTE — Assessment & Plan Note (Signed)
Saw hematology 09/2022 - iron def.  IV iron.

## 2023-02-05 ENCOUNTER — Other Ambulatory Visit: Payer: Self-pay | Admitting: Internal Medicine

## 2023-02-05 ENCOUNTER — Other Ambulatory Visit: Payer: Self-pay

## 2023-02-05 DIAGNOSIS — E039 Hypothyroidism, unspecified: Secondary | ICD-10-CM

## 2023-02-05 DIAGNOSIS — N1832 Chronic kidney disease, stage 3b: Secondary | ICD-10-CM

## 2023-02-05 MED ORDER — LEVOTHYROXINE SODIUM 137 MCG PO TABS: 137.0000 ug | ORAL_TABLET | Freq: Every day | ORAL | 0 refills | Status: DC

## 2023-02-12 ENCOUNTER — Other Ambulatory Visit (INDEPENDENT_AMBULATORY_CARE_PROVIDER_SITE_OTHER): Payer: Medicare Other

## 2023-02-12 DIAGNOSIS — N1832 Chronic kidney disease, stage 3b: Secondary | ICD-10-CM

## 2023-02-12 LAB — BASIC METABOLIC PANEL
BUN: 30 mg/dL — ABNORMAL HIGH (ref 6–23)
CO2: 23 meq/L (ref 19–32)
Calcium: 8 mg/dL — ABNORMAL LOW (ref 8.4–10.5)
Chloride: 112 meq/L (ref 96–112)
Creatinine, Ser: 1.11 mg/dL (ref 0.40–1.20)
GFR: 43.09 mL/min — ABNORMAL LOW (ref >60.00)
Glucose, Bld: 161 mg/dL — ABNORMAL HIGH (ref 70–99)
Potassium: 5.1 meq/L (ref 3.5–5.1)
Sodium: 140 meq/L (ref 135–145)

## 2023-02-18 DIAGNOSIS — Z8739 Personal history of other diseases of the musculoskeletal system and connective tissue: Secondary | ICD-10-CM | POA: Diagnosis not present

## 2023-02-18 DIAGNOSIS — M4726 Other spondylosis with radiculopathy, lumbar region: Secondary | ICD-10-CM | POA: Diagnosis not present

## 2023-02-18 DIAGNOSIS — M150 Primary generalized (osteo)arthritis: Secondary | ICD-10-CM | POA: Diagnosis not present

## 2023-03-07 ENCOUNTER — Encounter: Payer: Self-pay | Admitting: Cardiovascular Disease

## 2023-03-07 ENCOUNTER — Ambulatory Visit: Payer: Medicare Other | Attending: Cardiovascular Disease | Admitting: Cardiovascular Disease

## 2023-03-07 VITALS — BP 136/86 | HR 98 | Ht 62.0 in | Wt 138.6 lb

## 2023-03-07 DIAGNOSIS — I4821 Permanent atrial fibrillation: Secondary | ICD-10-CM | POA: Insufficient documentation

## 2023-03-07 DIAGNOSIS — I10 Essential (primary) hypertension: Secondary | ICD-10-CM | POA: Insufficient documentation

## 2023-03-07 DIAGNOSIS — I5032 Chronic diastolic (congestive) heart failure: Secondary | ICD-10-CM | POA: Diagnosis not present

## 2023-03-07 DIAGNOSIS — I2510 Atherosclerotic heart disease of native coronary artery without angina pectoris: Secondary | ICD-10-CM | POA: Diagnosis not present

## 2023-03-07 DIAGNOSIS — I340 Nonrheumatic mitral (valve) insufficiency: Secondary | ICD-10-CM | POA: Insufficient documentation

## 2023-03-07 NOTE — Patient Instructions (Signed)
 Medication Instructions:  No changes *If you need a refill on your cardiac medications before your next appointment, please call your pharmacy*   Lab Work: None ordered If you have labs (blood work) drawn today and your tests are completely normal, you will receive your results only by: MyChart Message (if you have MyChart) OR A paper copy in the mail If you have any lab test that is abnormal or we need to change your treatment, we will call you to review the results.   Testing/Procedures: None ordered   Follow-Up: At [facility], you and your health needs are our priority.  As part of our continuing mission to provide you with exceptional heart care, we have created designated Provider Care Teams.  These Care Teams include your primary Cardiologist (physician) and Advanced Practice Providers (APPs -  Physician Assistants and Nurse Practitioners) who all work together to provide you with the care you need, when you need it.  We recommend signing up for the patient portal called "MyChart".  Sign up information is provided on this After Visit Summary.  MyChart is used to connect with patients for Virtual Visits (Telemedicine).  Patients are able to view lab/test results, encounter notes, upcoming appointments, etc.  Non-urgent messages can be sent to your provider as well.   To learn more about what you can do with MyChart, go to [email or web address].    Your next appointment:   6 month(s)  Provider:   You may see Lorine Bears, MD or one of the following Advanced Practice Providers on your designated Care Team:   Nicolasa Ducking, NP Eula Listen, PA-C Cadence Fransico Michael, PA-C Charlsie Quest, NP Carlos Levering, NP

## 2023-03-07 NOTE — Progress Notes (Signed)
Cardiology Office Note   Date:  03/07/2023   ID:  Ruth Roth, DOB 1928/03/24, MRN [id number]  PCP:  Dale Fairview, MD  Cardiologist:   Lorine Bears, MD   Chief Complaint  Patient presents with   Follow-up    Patient reports episodes of pulse running in 120s without exertion.         History of Present Illness: Ruth Roth is a 89 y.o. female who presents for a follow-up visit regarding coronary artery disease, chronic diastolic heart failure, mitral regurgitation, tachybradycardia syndrome status post pacemaker placement and persistent atrial fibrillation .  She has known history of hypertension, chronic kidney disease, peptic ulcer disease, fibrocystic breast disease, inflammatory arthritis, autoimmune hepatitis with cirrhosis and neuropathy.  She had a small non-ST elevation myocardial infarction in March 2016 with acute diastolic heart failure after a GI illness.  Echocardiogram showed normal LV systolic function. Cardiac catheterization showed showed 60% ostial left circumflex hazy stenosis which was possibly the culprit. Mild LAD/RCA disease. She was treated medically.    She had worsening heart failure 2019.  Right and left cardiac catheterization in June 2019 showed  showed mild nonobstructive coronary artery disease.  Ejection fraction was 50 to 55% with severe mitral regurgitation.  Right heart catheterization showed severely elevated filling pressures with moderate to severe pulmonary hypertension and severely reduced cardiac output.  She required milrinone and aggressive diuresis with subsequent improvement in mitral regurgitation. Atrial fibrillation is currently being treated with rate control due to symptomatic maintaining sinus rhythm.    She had a pacemaker placement in July 2023. Most recent echocardiogram in January of 2024 showed normal LV systolic function with severe biatrial enlargement and mild mitral regurgitation.  She has been doing reasonably well  with no recent chest pain or lower extremity edema.  She does report intermittent dyspnea and some days she has no energy at all.  Her weight is down.  Past Medical History:  Diagnosis Date   (HFpEF) heart failure with preserved ejection fraction (HCC)    a. 07/2017 Echo: EF 50-55%, no rwma, mild AS/AI, mod MR, mod dil LA, mod TR, PASP ; b. 03/2021 Echo: EF 60-65%. No rwma. Nl RV fxn. Sev BAE. Mild MR. Mild AI/AS.   Autoimmune hepatitis (HCC)    followed by Dr Marva Panda   Fibrocystic breast disease    Glaucoma    Hypercholesterolemia    Hypertension    Hypothyroidism    Inflammatory arthritis    MI (myocardial infarction) (HCC)    Mitral regurgitation    a. 07/2017 Echo: Mod MR; b. 08/2017 Cath: 4+/Severe MR; c. 08/2017 TEE: Mild MR; d. 03/2021 Echo: Mild MR.   Neuropathy    Non-obstructive Coronary artery disease    a. 05/2014 NSTEMI/Cath: LM nl, LAD mild dzs, LCX 60ost hazy (? culprit), RCA mild dzs. EF 60% by echo-->Med Rx; b. 08/2017 Cath: LM 30ost, LAD min irregs, LCX small, 40ost/p, RCA large, min irregs, RPDA/RPL1 nl, EF 50-55%. 4+MR.   Osteoarthritis    Permanent atrial fibrillation (HCC)    a. CHA2DS2VASc = 6-->Xarelto.   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    Past Surgical History:  Procedure Laterality Date   ABDOMINAL HYSTERECTOMY  1963   partial, secondary to fibroids   APPENDECTOMY     Billroth     CARDIAC CATHETERIZATION  05/12/14   [facility]   CARDIOVERSION N/A 12/26/2016   Procedure: CARDIOVERSION;  Surgeon: Kirke Corin,  Chelsea Aus, MD;  Location: [facility] ORS;  Service: Cardiovascular;  Laterality: N/A;   CARDIOVERSION N/A 05/16/2017   Procedure: CARDIOVERSION;  Surgeon: Iran Ouch, MD;  Location: [facility];  Service: Cardiovascular;  Laterality: N/A;   CARDIOVERSION N/A 06/09/2017   Procedure: CARDIOVERSION;  Surgeon: Iran Ouch, MD;  Location: [facility];  Service: Cardiovascular;  Laterality: N/A;   CARDIOVERSION  N/A 11/30/2018   Procedure: CARDIOVERSION;  Surgeon: Iran Ouch, MD;  Location: [facility];  Service: Cardiovascular;  Laterality: N/A;   CHOLECYSTECTOMY  1998   COLONOSCOPY  2009   PACEMAKER IMPLANT N/A 09/28/2021   Procedure: PACEMAKER IMPLANT;  Surgeon: Lanier Prude, MD;  Location: [facility];  Service: Cardiovascular;  Laterality: N/A;   RIGHT/LEFT HEART CATH AND CORONARY ANGIOGRAPHY Bilateral 08/21/2017   Procedure: RIGHT/LEFT HEART CATH AND CORONARY ANGIOGRAPHY;  Surgeon: Iran Ouch, MD;  Location: [facility] CV [facility];  Service: Cardiovascular;  Laterality: Bilateral;   TEE WITHOUT CARDIOVERSION N/A 08/25/2017   Procedure: TRANSESOPHAGEAL ECHOCARDIOGRAM (TEE);  Surgeon: Dolores Patty, MD;  Location: [facility];  Service: Cardiovascular;  Laterality: N/A;   UPPER GI ENDOSCOPY  2004     Current Outpatient Medications  Medication Sig Dispense Refill   diltiazem (CARDIZEM CD) 240 MG 24 hr capsule Take 240 mg by mouth daily.     dorzolamide (TRUSOPT) 2 % ophthalmic solution Place 1 drop into the left eye 2 (two) times daily.     fluticasone (FLONASE) 50 MCG/ACT nasal spray Place 2 sprays into both nostrils daily as needed for allergies.     gabapentin (NEURONTIN) 300 MG capsule One capsule tid and two capsules q hs. 150 capsule 2   ipratropium (ATROVENT) 0.03 % nasal spray Place 2 sprays into both nostrils 3 (three) times daily as needed for rhinitis.     latanoprost (XALATAN) 0.005 % ophthalmic solution Place 1 drop into both eyes at bedtime.      levothyroxine (SYNTHROID) 137 MCG tablet Take 1 tablet (137 mcg total) by mouth daily before breakfast. 60 tablet 0   metoprolol tartrate (LOPRESSOR) 100 MG tablet TAKE 1 AND 1/2 TABLETS(150 MG) BY MOUTH TWICE DAILY 270 tablet 0   Multiple Vitamin (MULTI-VITAMIN) tablet Take 1 tablet by mouth at bedtime.      Probiotic Product (ALIGN) 4 MG CAPS Take one capsule q day 30 capsule 0   torsemide (DEMADEX) 20 MG tablet  Take 1 tablet (20 mg total) by mouth daily. 90 tablet 3   traMADol (ULTRAM) 50 MG tablet Take 1 tablet (50 mg total) by mouth 2 (two) times daily as needed. 60 tablet 0   ursodiol (ACTIGALL) 300 MG capsule Take 300-600 mg by mouth See admin instructions. Take 2 capsules (600 mg) daily in the morning   & 1 capsule (300 mg) at night.     XARELTO 15 MG TABS tablet TAKE 1 TABLET(15 MG) BY MOUTH DAILY WITH SUPPER 90 tablet 1   fluticasone-salmeterol (ADVAIR HFA) 115-21 MCG/ACT inhaler Inhale 2 puffs into the lungs 2 (two) times daily. (Patient not taking: Reported on 03/07/2023) 1 each 1   levalbuterol (XOPENEX HFA) 45 MCG/ACT inhaler Inhale 1 puff into the lungs every 6 (six) hours as needed for wheezing or shortness of breath. (Patient not taking: Reported on 03/07/2023) 1 each 1   levothyroxine (SYNTHROID) 125 MCG tablet Take 1 tablet (125 mcg total) by mouth daily. (Patient not taking: Reported on 03/07/2023) 90 tablet 3   No current facility-administered medications for  this visit.    Allergies:   Statins, Hydroxychloroquine, Haldol [haloperidol lactate], Librax [chlordiazepoxide-clidinium], Plavix [clopidogrel bisulfate], and Ramipril    Social History:  The patient  reports that she has never smoked. She has never used smokeless tobacco. She reports that she does not drink alcohol and does not use drugs.   Family History:  The patient's family history includes Colon cancer in her daughter; Esophageal cancer in her brother; Ovarian cancer in her daughter; Stroke in her mother.    ROS:  Please see the history of present illness.   Otherwise, review of systems are positive for none.   All other systems are reviewed and negative.    PHYSICAL EXAM: VS:  BP 136/86 (BP Location: Left Arm, Patient Position: Sitting, Cuff Size: Normal)   Pulse 98   Ht 5\' 2"  (1.575 m)   Wt 138 lb 9.6 oz (62.9 kg)   SpO2 97%   BMI 25.35 kg/m  , BMI Body mass index is 25.35 kg/m. GEN: Well nourished, well  developed, in no acute distress  HEENT: normal  Neck: No JVD, carotid bruits, or masses Cardiac: Irregularly irregular; no rubs, or gallops.  1/ 6 systolic ejection murmur at the aortic area.  Mild bilateral leg edema Respiratory: Clear to auscultation GI: soft, nontender, nondistended, + BS MS: no deformity or atrophy  Skin: warm and dry, no rash Neuro:  Strength and sensation are intact Psych: euthymic mood, full affect   EKG:  EKG is ordered today. The ekg ordered today demonstrates:  Atrial fibrillation Abnormal QRS-T angle, consider primary T wave abnormality When compared with ECG of 27-Jan-2023 11:00, No significant change was found   Recent Labs: 11/18/2022: BNP 634.6 12/04/2022: Hemoglobin 12.3; Platelets 239 02/03/2023: ALT 13; TSH 6.64 02/12/2023: BUN 30; Creatinine, Ser 1.11; Potassium 5.1; Sodium 140    Lipid Panel    Component Value Date/Time   CHOL 162 02/03/2023 1517   TRIG 161.0 (H) 02/03/2023 1517   HDL 48.90 02/03/2023 1517   CHOLHDL 3 02/03/2023 1517   VLDL 32.2 02/03/2023 1517   LDLCALC 81 02/03/2023 1517   LDLDIRECT 143.5 12/17/2012 0955      Wt Readings from Last 3 Encounters:  03/07/23 138 lb 9.6 oz (62.9 kg)  02/03/23 140 lb 3.2 oz (63.6 kg)  01/27/23 144 lb (65.3 kg)       ASSESSMENT AND PLAN:  1.    Permanent atrial fibrillation: Ventricular rate is reasonably controlled with metoprolol and diltiazem.  Continue anticoagulation with low-dose Xarelto 15 mg once daily.  Most recent labs showed a creatinine of 1.1.  2.  Chronic diastolic heart failure: She appears to be euvolemic on current dose of torsemide.    3. Essential hypertension: Blood pressures well controlled on current medications.   4.  Functional mitral regurgitation: This was mild on recent echocardiogram.  5.  Coronary artery disease involving native coronary arteries without angina: Continue medical therapy.   Disposition:   FU with me in 6 months.  Signed,  Lorine Bears, MD  03/07/2023 5:33 PM    [facility]

## 2023-03-10 ENCOUNTER — Other Ambulatory Visit: Payer: Self-pay | Admitting: Internal Medicine

## 2023-03-10 DIAGNOSIS — N2581 Secondary hyperparathyroidism of renal origin: Secondary | ICD-10-CM | POA: Diagnosis not present

## 2023-03-10 DIAGNOSIS — R809 Proteinuria, unspecified: Secondary | ICD-10-CM | POA: Diagnosis not present

## 2023-03-10 DIAGNOSIS — N1831 Chronic kidney disease, stage 3a: Secondary | ICD-10-CM | POA: Diagnosis not present

## 2023-03-10 DIAGNOSIS — E039 Hypothyroidism, unspecified: Secondary | ICD-10-CM

## 2023-03-10 DIAGNOSIS — I10 Essential (primary) hypertension: Secondary | ICD-10-CM | POA: Diagnosis not present

## 2023-03-14 ENCOUNTER — Other Ambulatory Visit: Payer: Self-pay | Admitting: Cardiovascular Disease

## 2023-03-17 ENCOUNTER — Telehealth: Payer: Self-pay

## 2023-03-17 MED ORDER — TRAMADOL HCL 50 MG PO TABS: 50.0000 mg | ORAL_TABLET | Freq: Two times a day (BID) | ORAL | 0 refills | Status: DC | PRN

## 2023-03-17 NOTE — Telephone Encounter (Signed)
 Last refilled 01/03/23. #60 no refill. Needs to be sent to [facility] in [place]

## 2023-03-17 NOTE — Telephone Encounter (Signed)
 Copied from CRM [id number]. Topic: Clinical - Medication Refill >> Mar 17, 2023 12:00 PM Montie POUR wrote: Most Recent Primary Care Visit:  Provider: [facility]  Department: [facility]  Visit Type: LAB  Date: 02/12/2023  Medication: TraMADol  (ULTRAM ) 50 MG tablet  Has the patient contacted their pharmacy? Yes (Agent: If no, request that the patient contact the pharmacy for the refill. If patient does not wish to contact the pharmacy document the reason why and proceed with request.) (Agent: If yes, when and what did the pharmacy advise?)  Is this the correct pharmacy for this prescription? Yes - [facility] in [place] If no, delete pharmacy and type the correct one.  This is the patient's preferred pharmacy:  [id number] - [place], [place] - [address] AT [facility] OF [address] [place] [address] Phone: [phone] Fax: [phone]  [id number] - [place], [place] - [address] [place] [address] Phone: [phone] Fax: [phone]  [facility] [phone] - [place], [place] - [address] AT [facility] [address]  [place] [address] Phone: [phone] Fax: [phone]   Has the prescription been filled recently? No  Is the patient out of the medication? Yes   Has the patient been seen for an appointment in the last year OR does the patient have an upcoming appointment? Yes  Can we respond through MyChart? No  Agent: Please be advised that Rx refills may take up to 3 business days. We ask that you follow-up with your pharmacy.

## 2023-03-17 NOTE — Addendum Note (Signed)
 Addended by: Charm Barges on: 03/17/2023 08:23 PM   Modules accepted: Orders

## 2023-03-17 NOTE — Telephone Encounter (Signed)
Rx ok'd for tramadol  °

## 2023-03-18 ENCOUNTER — Telehealth: Payer: Self-pay | Admitting: Internal Medicine

## 2023-03-18 NOTE — Telephone Encounter (Signed)
 Patient need orders

## 2023-03-18 NOTE — Telephone Encounter (Signed)
Left message for patient letting her know

## 2023-03-19 ENCOUNTER — Other Ambulatory Visit: Payer: Self-pay

## 2023-03-19 DIAGNOSIS — E039 Hypothyroidism, unspecified: Secondary | ICD-10-CM

## 2023-03-19 NOTE — Telephone Encounter (Signed)
 Lab ordered.

## 2023-03-24 ENCOUNTER — Inpatient Hospital Stay: Payer: Medicare Other | Attending: Oncology

## 2023-03-24 DIAGNOSIS — Z801 Family history of malignant neoplasm of trachea, bronchus and lung: Secondary | ICD-10-CM | POA: Diagnosis not present

## 2023-03-24 DIAGNOSIS — I4821 Permanent atrial fibrillation: Secondary | ICD-10-CM | POA: Diagnosis not present

## 2023-03-24 DIAGNOSIS — I130 Hypertensive heart and chronic kidney disease with heart failure and stage 1 through stage 4 chronic kidney disease, or unspecified chronic kidney disease: Secondary | ICD-10-CM | POA: Diagnosis not present

## 2023-03-24 DIAGNOSIS — E039 Hypothyroidism, unspecified: Secondary | ICD-10-CM | POA: Diagnosis not present

## 2023-03-24 DIAGNOSIS — R5383 Other fatigue: Secondary | ICD-10-CM | POA: Diagnosis not present

## 2023-03-24 DIAGNOSIS — N1832 Chronic kidney disease, stage 3b: Secondary | ICD-10-CM | POA: Diagnosis not present

## 2023-03-24 DIAGNOSIS — Z823 Family history of stroke: Secondary | ICD-10-CM | POA: Diagnosis not present

## 2023-03-24 DIAGNOSIS — Z90710 Acquired absence of both cervix and uterus: Secondary | ICD-10-CM | POA: Insufficient documentation

## 2023-03-24 DIAGNOSIS — D631 Anemia in chronic kidney disease: Secondary | ICD-10-CM | POA: Insufficient documentation

## 2023-03-24 DIAGNOSIS — R040 Epistaxis: Secondary | ICD-10-CM | POA: Diagnosis not present

## 2023-03-24 DIAGNOSIS — Z800 Family history of malignant neoplasm of digestive organs: Secondary | ICD-10-CM | POA: Insufficient documentation

## 2023-03-24 DIAGNOSIS — E7800 Pure hypercholesterolemia, unspecified: Secondary | ICD-10-CM | POA: Insufficient documentation

## 2023-03-24 DIAGNOSIS — E611 Iron deficiency: Secondary | ICD-10-CM | POA: Insufficient documentation

## 2023-03-24 DIAGNOSIS — Z888 Allergy status to other drugs, medicaments and biological substances status: Secondary | ICD-10-CM | POA: Diagnosis not present

## 2023-03-24 DIAGNOSIS — Z8711 Personal history of peptic ulcer disease: Secondary | ICD-10-CM | POA: Insufficient documentation

## 2023-03-24 DIAGNOSIS — Z7901 Long term (current) use of anticoagulants: Secondary | ICD-10-CM | POA: Insufficient documentation

## 2023-03-24 DIAGNOSIS — E538 Deficiency of other specified B group vitamins: Secondary | ICD-10-CM | POA: Insufficient documentation

## 2023-03-24 DIAGNOSIS — Z8041 Family history of malignant neoplasm of ovary: Secondary | ICD-10-CM | POA: Diagnosis not present

## 2023-03-24 DIAGNOSIS — I252 Old myocardial infarction: Secondary | ICD-10-CM | POA: Diagnosis not present

## 2023-03-24 DIAGNOSIS — Z79899 Other long term (current) drug therapy: Secondary | ICD-10-CM | POA: Insufficient documentation

## 2023-03-24 DIAGNOSIS — D500 Iron deficiency anemia secondary to blood loss (chronic): Secondary | ICD-10-CM

## 2023-03-24 DIAGNOSIS — I5032 Chronic diastolic (congestive) heart failure: Secondary | ICD-10-CM | POA: Insufficient documentation

## 2023-03-24 DIAGNOSIS — Z9049 Acquired absence of other specified parts of digestive tract: Secondary | ICD-10-CM | POA: Diagnosis not present

## 2023-03-24 LAB — CBC WITH DIFFERENTIAL (CANCER CENTER ONLY)
Abs Immature Granulocytes: 0.02 10*3/uL (ref 0.00–0.07)
Basophils Absolute: 0 10*3/uL (ref 0.0–0.1)
Basophils Relative: 1 %
Eosinophils Absolute: 0.2 10*3/uL (ref 0.0–0.5)
Eosinophils Relative: 3 %
HCT: 39.9 % (ref 36.0–46.0)
Hemoglobin: 12.1 g/dL (ref 12.0–15.0)
Immature Granulocytes: 0 %
Lymphocytes Relative: 10 %
Lymphs Abs: 0.7 10*3/uL (ref 0.7–4.0)
MCH: 30 pg (ref 26.0–34.0)
MCHC: 30.3 g/dL (ref 30.0–36.0)
MCV: 98.8 fL (ref 80.0–100.0)
Monocytes Absolute: 0.8 10*3/uL (ref 0.1–1.0)
Monocytes Relative: 11 %
Neutro Abs: 5.4 10*3/uL (ref 1.7–7.7)
Neutrophils Relative %: 75 %
Platelet Count: 221 10*3/uL (ref 150–400)
RBC: 4.04 MIL/uL (ref 3.87–5.11)
RDW: 15.6 % — ABNORMAL HIGH (ref 11.5–15.5)
WBC Count: 7.1 10*3/uL (ref 4.0–10.5)
nRBC: 0 % (ref 0.0–0.2)

## 2023-03-24 LAB — CMP (CANCER CENTER ONLY)
ALT: 21 U/L (ref 0–44)
AST: 16 U/L (ref 15–41)
Albumin: 4.2 g/dL (ref 3.5–5.0)
Alkaline Phosphatase: 90 U/L (ref 38–126)
Anion gap: 11 (ref 5–15)
BUN: 35 mg/dL — ABNORMAL HIGH (ref 8–23)
CO2: 24 mmol/L (ref 22–32)
Calcium: 9.1 mg/dL (ref 8.9–10.3)
Chloride: 104 mmol/L (ref 98–111)
Creatinine: 1.42 mg/dL — ABNORMAL HIGH (ref 0.44–1.00)
GFR, Estimated: 35 mL/min — ABNORMAL LOW (ref >60)
Glucose, Bld: 91 mg/dL (ref 70–99)
Potassium: 4.3 mmol/L (ref 3.5–5.1)
Sodium: 139 mmol/L (ref 135–145)
Total Bilirubin: 0.6 mg/dL (ref 0.0–1.2)
Total Protein: 6.9 g/dL (ref 6.5–8.1)

## 2023-03-24 LAB — IRON AND TIBC
Iron: 74 ug/dL (ref 28–170)
Saturation Ratios: 13 % (ref 10.4–31.8)
TIBC: 580 ug/dL — ABNORMAL HIGH (ref 250–450)
UIBC: 506 ug/dL

## 2023-03-24 LAB — FERRITIN: Ferritin: 18 ng/mL (ref 11–307)

## 2023-03-24 LAB — LACTATE DEHYDROGENASE: LDH: 201 U/L — ABNORMAL HIGH (ref 98–192)

## 2023-03-24 LAB — VITAMIN B12: Vitamin B-12: 616 pg/mL (ref 180–914)

## 2023-03-24 LAB — FOLATE: Folate: >40 ng/mL (ref >5.9)

## 2023-03-26 ENCOUNTER — Other Ambulatory Visit: Payer: Medicare Other

## 2023-03-26 ENCOUNTER — Inpatient Hospital Stay: Payer: Medicare Other

## 2023-03-26 ENCOUNTER — Encounter: Payer: Self-pay | Admitting: Oncology

## 2023-03-26 ENCOUNTER — Inpatient Hospital Stay (HOSPITAL_BASED_OUTPATIENT_CLINIC_OR_DEPARTMENT_OTHER): Payer: Medicare Other | Admitting: Oncology

## 2023-03-26 VITALS — BP 157/96 | HR 111 | Temp 97.2°F | Resp 18 | Wt 139.9 lb

## 2023-03-26 DIAGNOSIS — I4821 Permanent atrial fibrillation: Secondary | ICD-10-CM | POA: Diagnosis not present

## 2023-03-26 DIAGNOSIS — N1832 Chronic kidney disease, stage 3b: Secondary | ICD-10-CM

## 2023-03-26 DIAGNOSIS — D631 Anemia in chronic kidney disease: Secondary | ICD-10-CM | POA: Diagnosis not present

## 2023-03-26 DIAGNOSIS — I130 Hypertensive heart and chronic kidney disease with heart failure and stage 1 through stage 4 chronic kidney disease, or unspecified chronic kidney disease: Secondary | ICD-10-CM | POA: Diagnosis not present

## 2023-03-26 DIAGNOSIS — E611 Iron deficiency: Secondary | ICD-10-CM | POA: Diagnosis not present

## 2023-03-26 DIAGNOSIS — E538 Deficiency of other specified B group vitamins: Secondary | ICD-10-CM

## 2023-03-26 DIAGNOSIS — N189 Chronic kidney disease, unspecified: Secondary | ICD-10-CM | POA: Diagnosis not present

## 2023-03-26 NOTE — Assessment & Plan Note (Addendum)
 History of iron deficiency in the context of chronic kidney disease. Lab Results  Component Value Date   HGB 12.1 03/24/2023   TIBC 580 (H) 03/24/2023   IRONPCTSAT 13 03/24/2023   FERRITIN 18 03/24/2023    Iron panel is consistent with early iron deficiency. She cannot tolerate oral iron supplementation.  I recommend IV Venofer treatments and the patient declined. Recommend patient to take iron rich food as well as multivitamin that contains iron.  Repeat levels in 6 months

## 2023-03-26 NOTE — Progress Notes (Signed)
 Hematology/Oncology Progress note [phone] [phone]       CHIEF COMPLAINTS/REASON FOR VISIT:  Follow-up for anemia  ASSESSMENT & PLAN:   Anemia in chronic kidney disease (CKD) History of iron deficiency in the context of chronic kidney disease. Lab Results  Component Value Date   HGB 12.1 03/24/2023   TIBC 580 (H) 03/24/2023   IRONPCTSAT 13 03/24/2023   FERRITIN 18 03/24/2023    Hemoglobin has slightly decreased.  Iron panel is consistent with early iron deficiency. She cannot tolerate oral iron supplementation.  I recommend IV Venofer treatments and the patient declined. Recommend patient to take iron rich food as well as multivitamin that contains iron.  Repeat levels in 6 months  B12 deficiency B12 level is stable.  continue over-the-counter vitamin B12 1000mcg supplementation once per week.  CKD (chronic kidney disease) stage 3, GFR 30-59 ml/min (HCC) Encourage oral hydration and avoid nephrotoxins.  Follow up with Dr. Rhesa Celeste   Orders Placed This Encounter  Procedures   CBC with Differential (Cancer Center Only)    Standing Status:   Future    Expected Date:   09/23/2023    Expiration Date:   03/25/2024   Iron and TIBC    Standing Status:   Future    Expected Date:   09/23/2023    Expiration Date:   03/25/2024   Ferritin    Standing Status:   Future    Expected Date:   09/23/2023    Expiration Date:   03/25/2024   Retic Panel    Standing Status:   Future    Expected Date:   09/23/2023    Expiration Date:   03/25/2024   Vitamin B12    Standing Status:   Future    Expected Date:   09/23/2023    Expiration Date:   03/25/2024   Follow-up in 6 months. All questions were answered. The patient knows to call the clinic with any problems, questions or concerns.  Timmy Forbes, MD, PhD [facility] Hematology Oncology 03/26/2023   HISTORY OF PRESENTING ILLNESS:  Ruth Roth is a  89 y.o.  female presents for follow-up of iron deficiency  anemia. She has a history of iron deficiency anemia, cannot tolerate oral iron supplementation.  Previously tolerated IV Feraheme  treatments. She takes Xarelto  for A Fib. Has recurrent epistaxis. History of IDA, cannot tolerate oral iron supplements.   INTERVAL HISTORY Ruth Roth is a 89 y.o. female who has above history reviewed by me today presents for follow up visit for management of anemia She reports feeling well.  She takes vitamin B12 500 mcg once a week. She has no new concerns today.Denies hematochezia, hematuria, hematemesis, epistaxis, black tarry stool or easy bruising.      Review of Systems  Constitutional:  Positive for fatigue. Negative for appetite change, chills and fever.  HENT:   Negative for hearing loss and voice change.   Eyes:  Negative for eye problems.  Respiratory:  Negative for chest tightness and cough.   Cardiovascular:  Negative for chest pain.  Gastrointestinal:  Negative for abdominal distention, abdominal pain and blood in stool.  Endocrine: Negative for hot flashes.  Genitourinary:  Negative for difficulty urinating and frequency.   Musculoskeletal:  Negative for arthralgias.  Skin:  Negative for itching and rash.  Neurological:  Negative for extremity weakness.  Hematological:  Negative for adenopathy.  Psychiatric/Behavioral:  Negative for confusion.     MEDICAL HISTORY:  Past Medical History:  Diagnosis Date   (  HFpEF) heart failure with preserved ejection fraction (HCC)    a. 07/2017 Echo: EF 50-55%, no rwma, mild AS/AI, mod MR, mod dil LA, mod TR, PASP ; b. 03/2021 Echo: EF 60-65%. No rwma. Nl RV fxn. Sev BAE. Mild MR. Mild AI/AS.   Autoimmune hepatitis (HCC)    followed by Dr Peg Bouton   Fibrocystic breast disease    Glaucoma    Hypercholesterolemia    Hypertension    Hypothyroidism    Inflammatory arthritis    MI (myocardial infarction) (HCC)    Mitral regurgitation    a. 07/2017 Echo: Mod MR; b. 08/2017 Cath: 4+/Severe MR; c.  08/2017 TEE: Mild MR; d. 03/2021 Echo: Mild MR.   Neuropathy    Non-obstructive Coronary artery disease    a. 05/2014 NSTEMI/Cath: LM nl, LAD mild dzs, LCX 60ost hazy (? culprit), RCA mild dzs. EF 60% by echo-->Med Rx; b. 08/2017 Cath: LM 30ost, LAD min irregs, LCX small, 40ost/p, RCA large, min irregs, RPDA/RPL1 nl, EF 50-55%. 4+MR.   Osteoarthritis    Permanent atrial fibrillation (HCC)    a. CHA2DS2VASc = 6-->Xarelto .   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    SURGICAL HISTORY: Past Surgical History:  Procedure Laterality Date   ABDOMINAL HYSTERECTOMY  1963   partial, secondary to fibroids   APPENDECTOMY     Billroth     CARDIAC CATHETERIZATION  05/12/14   [facility]   CARDIOVERSION N/A 12/26/2016   Procedure: CARDIOVERSION;  Surgeon: Wenona Hamilton, MD;  Location: [facility] ORS;  Service: Cardiovascular;  Laterality: N/A;   CARDIOVERSION N/A 05/16/2017   Procedure: CARDIOVERSION;  Surgeon: Wenona Hamilton, MD;  Location: [facility] ORS;  Service: Cardiovascular;  Laterality: N/A;   CARDIOVERSION N/A 06/09/2017   Procedure: CARDIOVERSION;  Surgeon: Wenona Hamilton, MD;  Location: [facility] ORS;  Service: Cardiovascular;  Laterality: N/A;   CARDIOVERSION N/A 11/30/2018   Procedure: CARDIOVERSION;  Surgeon: Wenona Hamilton, MD;  Location: [facility] ORS;  Service: Cardiovascular;  Laterality: N/A;   CHOLECYSTECTOMY  1998   COLONOSCOPY  2009   PACEMAKER IMPLANT N/A 09/28/2021   Procedure: PACEMAKER IMPLANT;  Surgeon: Boyce Byes, MD;  Location: [facility]AS[facility] CV [facility];  Service: Cardiovascular;  Laterality: N/A;   RIGHT/LEFT HEART CATH AND CORONARY ANGIOGRAPHY Bilateral 08/21/2017   Procedure: RIGHT/LEFT HEART CATH AND CORONARY ANGIOGRAPHY;  Surgeon: Wenona Hamilton, MD;  Location: [facility] INVASIVE CV LAB;  Service: Cardiovascular;  Laterality: Bilateral;   TEE WITHOUT CARDIOVERSION N/A 08/25/2017   Procedure: TRANSESOPHAGEAL ECHOCARDIOGRAM (TEE);  Surgeon:  Mardell Shade, MD;  Location: [facility];  Service: Cardiovascular;  Laterality: N/A;   UPPER GI ENDOSCOPY  2004    SOCIAL HISTORY: Social History   Socioeconomic History   Marital status: Widowed    Spouse name: Not on file   Number of children: 2   Years of education: Not on file   Highest education level: Not on file  Occupational History   Occupation: retired  Tobacco Use   Smoking status: Never   Smokeless tobacco: Never   Tobacco comments:    Never smoke 01/23/22  Vaping Use   Vaping status: Never Used  Substance and Sexual Activity   Alcohol  use: No    Alcohol /week: 0.0 standard drinks of alcohol    Drug use: No   Sexual activity: Never  Other Topics Concern   Not on file  Social History Narrative   Not on file   Social Drivers of  Health   Financial Resource Strain: Low Risk  (06/17/2022)   Overall Financial Resource Strain (CARDIA)    Difficulty of Paying Living Expenses: Not hard at all  Food Insecurity: No Food Insecurity (06/17/2022)   Hunger Vital Sign    Worried About Running Out of Food in the Last Year: Never true    Ran Out of Food in the Last Year: Never true  Transportation Needs: No Transportation Needs (06/17/2022)   PRAPARE - Administrator, Civil Service (Medical): No    Lack of Transportation (Non-Medical): No  Physical Activity: Insufficiently Active (06/17/2022)   Exercise Vital Sign    Days of Exercise per Week: 3 days    Minutes of Exercise per Session: 40 min  Stress: No Stress Concern Present (06/17/2022)   [employer] of Occupational Health - Occupational Stress Questionnaire    Feeling of Stress : Not at all  Social Connections: Unknown (06/17/2022)   Social Connection and Isolation Panel [NHANES]    Frequency of Communication with Friends and Family: More than three times a week    Frequency of Social Gatherings with Friends and Family: More than three times a week    Attends Religious Services: Not on file    Active  Member of Clubs or Organizations: Not on file    Attends Banker Meetings: Not on file    Marital Status: Not on file  Intimate Partner Violence: Not At Risk (06/17/2022)   Humiliation, Afraid, Rape, and Kick questionnaire    Fear of Current or Ex-Partner: No    Emotionally Abused: No    Physically Abused: No    Sexually Abused: No    FAMILY HISTORY: Family History  Problem Relation Age of Onset   Stroke Mother    Esophageal cancer Brother        also had lung cancer   Ovarian cancer Daughter    Colon cancer Daughter    Breast cancer Neg Hx     ALLERGIES:  is allergic to statins, hydroxychloroquine, haldol [haloperidol lactate], librax [chlordiazepoxide-clidinium], plavix  [clopidogrel  bisulfate], and ramipril.  MEDICATIONS:  Current Outpatient Medications  Medication Sig Dispense Refill   diltiazem  (CARDIZEM  CD) 240 MG 24 hr capsule Take 240 mg by mouth daily.     dorzolamide  (TRUSOPT ) 2 % ophthalmic solution Place 1 drop into the left eye 2 (two) times daily.     fluticasone  (FLONASE ) 50 MCG/ACT nasal spray Place 2 sprays into both nostrils daily as needed for allergies.     gabapentin  (NEURONTIN ) 300 MG capsule One capsule tid and two capsules q hs. 150 capsule 2   ipratropium (ATROVENT ) 0.03 % nasal spray Place 2 sprays into both nostrils 3 (three) times daily as needed for rhinitis.     latanoprost  (XALATAN ) 0.005 % ophthalmic solution Place 1 drop into both eyes at bedtime.      levothyroxine  (SYNTHROID ) 137 MCG tablet TAKE 1 TABLET(137 MCG) BY MOUTH DAILY BEFORE BREAKFAST 60 tablet 0   metoprolol  tartrate (LOPRESSOR ) 100 MG tablet TAKE 1 AND 1/2 TABLETS(150 MG) BY MOUTH TWICE DAILY 270 tablet 3   Multiple Vitamin (MULTI-VITAMIN) tablet Take 1 tablet by mouth at bedtime.      Probiotic Product (ALIGN) 4 MG CAPS Take one capsule q day 30 capsule 0   torsemide  (DEMADEX ) 20 MG tablet Take 1 tablet (20 mg total) by mouth daily. 90 tablet 3   traMADol  (ULTRAM ) 50 MG  tablet Take 1 tablet (50 mg total) by mouth 2 (two)  times daily as needed. 60 tablet 0   ursodiol  (ACTIGALL ) 300 MG capsule Take 300-600 mg by mouth See admin instructions. Take 2 capsules (600 mg) daily in the morning   & 1 capsule (300 mg) at night.     XARELTO  15 MG TABS tablet TAKE 1 TABLET(15 MG) BY MOUTH DAILY WITH SUPPER 90 tablet 1   fluticasone -salmeterol (ADVAIR HFA) 115-21 MCG/ACT inhaler Inhale 2 puffs into the lungs 2 (two) times daily. (Patient not taking: Reported on 03/26/2023) 1 each 1   levalbuterol  (XOPENEX  HFA) 45 MCG/ACT inhaler Inhale 1 puff into the lungs every 6 (six) hours as needed for wheezing or shortness of breath. (Patient not taking: Reported on 03/26/2023) 1 each 1   No current facility-administered medications for this visit.     PHYSICAL EXAMINATION: ECOG PERFORMANCE STATUS: 1 - Symptomatic but completely ambulatory Vitals:   03/26/23 1338 03/26/23 1340  BP: (!) 161/99 (!) 157/96  Pulse: (!) 111   Resp:    Temp:     Filed Weights   03/26/23 1335  Weight: 139 lb 14.4 oz (63.5 kg)    Physical Exam Constitutional:      General: She is not in acute distress. HENT:     Head: Normocephalic and atraumatic.  Eyes:     General: No scleral icterus. Cardiovascular:     Rate and Rhythm: Normal rate. Rhythm irregular.     Comments: Irregular rhythm.  Pulmonary:     Effort: Pulmonary effort is normal. No respiratory distress.     Breath sounds: No wheezing.  Abdominal:     General: There is no distension.     Palpations: Abdomen is soft.  Musculoskeletal:        General: No deformity. Normal range of motion.     Cervical back: Normal range of motion and neck supple.  Skin:    General: Skin is warm and dry.     Findings: No erythema or rash.  Neurological:     Mental Status: She is alert and oriented to person, place, and time. Mental status is at baseline.  Psychiatric:        Mood and Affect: Mood normal.      LABORATORY DATA:  I have reviewed  the data as listed Lab Results  Component Value Date   WBC 7.1 03/24/2023   HGB 12.1 03/24/2023   HCT 39.9 03/24/2023   MCV 98.8 03/24/2023   PLT 221 03/24/2023   Recent Labs    07/04/22 1006 07/12/22 1434 10/14/22 1018 11/18/22 1558 11/27/22 1445 12/04/22 1402 02/03/23 1517 02/12/23 1341 03/24/23 1320  NA 138   < > 136   < > 136 138 137 140 139  K 5.2*   < > 4.5   < > 4.7 4.5 5.1 5.1 4.3  CL 106   < > 103   < > 110 109 103 112 104  CO2 25   < > 23   < > 19* 20* 25 23 24   GLUCOSE 87   < > 83   < > 65* 49* 68* 161* 91  BUN 26*   < > 40*   < > 40* 34* 37* 30* 35*  CREATININE 1.21*   < > 1.43*   < > 1.31* 1.07* 1.34* 1.11 1.42*  CALCIUM 8.4   < > 9.2   < > 8.1* 8.4* 9.1 8.0* 9.1  GFRNONAA  --    < >  --   --  38* 49*  --   --  35*  PROT 6.3   < > 6.9  --   --  6.7 6.6  --  6.9  ALBUMIN  4.0   < > 4.1  --   --  3.8 4.0  --  4.2  AST 17   < > 13  --   --  15 15  --  16  ALT 18   < > 16  --   --  18 13  --  21  ALKPHOS 106   < > 100  --   --  110 78  --  90  BILITOT 0.4   < > 0.4  --   --  0.6 0.4  --  0.6  BILIDIR 0.1  --  0.1  --   --   --  0.1  --   --    < > = values in this interval not displayed.   Iron/TIBC/Ferritin/ %Sat    Component Value Date/Time   IRON 74 03/24/2023 1315   TIBC 580 (H) 03/24/2023 1315   FERRITIN 18 03/24/2023 1315   IRONPCTSAT 13 03/24/2023 1315    suggest chronic kidney disease.

## 2023-03-26 NOTE — Assessment & Plan Note (Addendum)
 Encourage oral hydration and avoid nephrotoxins.  Follow up with Dr. Lateef

## 2023-03-26 NOTE — Assessment & Plan Note (Addendum)
 B12 level is stable.  continue over-the-counter vitamin B12 500mcg supplementation once per week.

## 2023-03-31 ENCOUNTER — Ambulatory Visit: Payer: Medicare Other

## 2023-03-31 DIAGNOSIS — I495 Sick sinus syndrome: Secondary | ICD-10-CM

## 2023-03-31 DIAGNOSIS — I4821 Permanent atrial fibrillation: Secondary | ICD-10-CM

## 2023-04-01 LAB — CUP PACEART REMOTE DEVICE CHECK
Battery Remaining Longevity: 102 mo
Battery Remaining Percentage: 85 %
Battery Voltage: 3.02 V
Brady Statistic RV Percent Paced: 29 %
Date Time Interrogation Session: 20250120020018
Implantable Lead Connection Status: 753985
Implantable Lead Connection Status: 753985
Implantable Lead Implant Date: 20230721
Implantable Lead Implant Date: 20230721
Implantable Lead Location: 753859
Implantable Lead Location: 753860
Implantable Pulse Generator Implant Date: 20230721
Lead Channel Impedance Value: 450 Ohm
Lead Channel Pacing Threshold Amplitude: 0.75 V
Lead Channel Pacing Threshold Pulse Width: 0.5 ms
Lead Channel Sensing Intrinsic Amplitude: 12 mV
Lead Channel Setting Pacing Amplitude: 2.5 V
Lead Channel Setting Pacing Pulse Width: 0.5 ms
Lead Channel Setting Sensing Sensitivity: 2 mV
Pulse Gen Model: 2272
Pulse Gen Serial Number: 8087042

## 2023-04-10 ENCOUNTER — Ambulatory Visit: Payer: Medicare Other | Admitting: Internal Medicine

## 2023-04-10 ENCOUNTER — Encounter: Payer: Self-pay | Admitting: Intensive Care

## 2023-04-10 ENCOUNTER — Other Ambulatory Visit: Payer: Self-pay

## 2023-04-10 ENCOUNTER — Emergency Department: Payer: Medicare Other

## 2023-04-10 VITALS — BP 148/90 | HR 108 | Temp 97.9°F | Resp 16 | Ht 62.0 in | Wt 139.2 lb

## 2023-04-10 DIAGNOSIS — W01198A Fall on same level from slipping, tripping and stumbling with subsequent striking against other object, initial encounter: Secondary | ICD-10-CM | POA: Insufficient documentation

## 2023-04-10 DIAGNOSIS — I10 Essential (primary) hypertension: Secondary | ICD-10-CM | POA: Diagnosis not present

## 2023-04-10 DIAGNOSIS — N1832 Chronic kidney disease, stage 3b: Secondary | ICD-10-CM | POA: Diagnosis not present

## 2023-04-10 DIAGNOSIS — K743 Primary biliary cirrhosis: Secondary | ICD-10-CM

## 2023-04-10 DIAGNOSIS — E7800 Pure hypercholesterolemia, unspecified: Secondary | ICD-10-CM | POA: Diagnosis not present

## 2023-04-10 DIAGNOSIS — K219 Gastro-esophageal reflux disease without esophagitis: Secondary | ICD-10-CM | POA: Diagnosis not present

## 2023-04-10 DIAGNOSIS — S0511XA Contusion of eyeball and orbital tissues, right eye, initial encounter: Secondary | ICD-10-CM | POA: Diagnosis not present

## 2023-04-10 DIAGNOSIS — D329 Benign neoplasm of meninges, unspecified: Secondary | ICD-10-CM

## 2023-04-10 DIAGNOSIS — I272 Pulmonary hypertension, unspecified: Secondary | ICD-10-CM | POA: Diagnosis not present

## 2023-04-10 DIAGNOSIS — D500 Iron deficiency anemia secondary to blood loss (chronic): Secondary | ICD-10-CM

## 2023-04-10 DIAGNOSIS — I25118 Atherosclerotic heart disease of native coronary artery with other forms of angina pectoris: Secondary | ICD-10-CM

## 2023-04-10 DIAGNOSIS — Z7901 Long term (current) use of anticoagulants: Secondary | ICD-10-CM | POA: Diagnosis not present

## 2023-04-10 DIAGNOSIS — W19XXXA Unspecified fall, initial encounter: Secondary | ICD-10-CM

## 2023-04-10 DIAGNOSIS — G6289 Other specified polyneuropathies: Secondary | ICD-10-CM | POA: Diagnosis not present

## 2023-04-10 DIAGNOSIS — R220 Localized swelling, mass and lump, head: Secondary | ICD-10-CM | POA: Diagnosis not present

## 2023-04-10 DIAGNOSIS — I495 Sick sinus syndrome: Secondary | ICD-10-CM | POA: Diagnosis not present

## 2023-04-10 DIAGNOSIS — E039 Hypothyroidism, unspecified: Secondary | ICD-10-CM | POA: Diagnosis not present

## 2023-04-10 DIAGNOSIS — I5032 Chronic diastolic (congestive) heart failure: Secondary | ICD-10-CM

## 2023-04-10 DIAGNOSIS — E063 Autoimmune thyroiditis: Secondary | ICD-10-CM | POA: Diagnosis not present

## 2023-04-10 DIAGNOSIS — S0990XA Unspecified injury of head, initial encounter: Secondary | ICD-10-CM | POA: Insufficient documentation

## 2023-04-10 DIAGNOSIS — I4819 Other persistent atrial fibrillation: Secondary | ICD-10-CM

## 2023-04-10 DIAGNOSIS — K754 Autoimmune hepatitis: Secondary | ICD-10-CM

## 2023-04-10 DIAGNOSIS — R519 Headache, unspecified: Secondary | ICD-10-CM | POA: Diagnosis not present

## 2023-04-10 DIAGNOSIS — K7460 Unspecified cirrhosis of liver: Secondary | ICD-10-CM

## 2023-04-10 LAB — BASIC METABOLIC PANEL
Anion gap: 11 (ref 5–15)
BUN: 34 mg/dL — ABNORMAL HIGH (ref 8–23)
CO2: 21 mmol/L — ABNORMAL LOW (ref 22–32)
Calcium: 9.1 mg/dL (ref 8.9–10.3)
Chloride: 107 mmol/L (ref 98–111)
Creatinine, Ser: 1.13 mg/dL — ABNORMAL HIGH (ref 0.44–1.00)
GFR, Estimated: 46 mL/min — ABNORMAL LOW (ref >60)
Glucose, Bld: 106 mg/dL — ABNORMAL HIGH (ref 70–99)
Potassium: 5.1 mmol/L (ref 3.5–5.1)
Sodium: 139 mmol/L (ref 135–145)

## 2023-04-10 LAB — CBC WITH DIFFERENTIAL/PLATELET
Abs Immature Granulocytes: 0.02 10*3/uL (ref 0.00–0.07)
Basophils Absolute: 0 10*3/uL (ref 0.0–0.1)
Basophils Relative: 1 %
Eosinophils Absolute: 0.2 10*3/uL (ref 0.0–0.5)
Eosinophils Relative: 3 %
HCT: 41.1 % (ref 36.0–46.0)
Hemoglobin: 12.6 g/dL (ref 12.0–15.0)
Immature Granulocytes: 0 %
Lymphocytes Relative: 13 %
Lymphs Abs: 0.8 10*3/uL (ref 0.7–4.0)
MCH: 30.4 pg (ref 26.0–34.0)
MCHC: 30.7 g/dL (ref 30.0–36.0)
MCV: 99 fL (ref 80.0–100.0)
Monocytes Absolute: 0.6 10*3/uL (ref 0.1–1.0)
Monocytes Relative: 10 %
Neutro Abs: 4.4 10*3/uL (ref 1.7–7.7)
Neutrophils Relative %: 73 %
Platelets: 203 10*3/uL (ref 150–400)
RBC: 4.15 MIL/uL (ref 3.87–5.11)
RDW: 16.7 % — ABNORMAL HIGH (ref 11.5–15.5)
WBC: 6 10*3/uL (ref 4.0–10.5)
nRBC: 0 % (ref 0.0–0.2)

## 2023-04-10 NOTE — ED Triage Notes (Signed)
Patient presents after fall in bathroom at home. Right eye has discoloration. Patient unable to state what made her fall. Denies pain.   Takes xarelto daily.

## 2023-04-10 NOTE — ED Provider Triage Note (Signed)
Emergency Medicine Provider Triage Evaluation Note  Ruth Roth, a 89 y.o. female  was evaluated in triage.  Pt complains of facial contusion and black eye. She reports feeling healthy and well, but tipped over trying to sit on the toilet seat. She hit her head on the bathroom floor. She denies LOC, NV, or weakness. She would endorse several minutes of vertigo last night before bed, which resolved before bedtime. She present to the ED today at the advice of Dr. Lorin Picket.   Review of Systems  Positive: Dizziness, fall, facial contusion Negative: LOC  Physical Exam  There were no vitals taken for this visit. Gen:   Awake, no distress  NAD Resp:  Normal effort CTA MSK:   Moves extremities without difficulty  Other:    Medical Decision Making  Medically screening exam initiated at 5:26 PM.  Appropriate orders placed.  Ruth Roth was informed that the remainder of the evaluation will be completed by another provider, this initial triage assessment does not replace that evaluation, and the importance of remaining in the ED until their evaluation is complete.  Geriatric patient to the ED for evaluation of fall resulting in facial contusion and black eye.    Lissa Hoard, PA-C 04/10/23 1732

## 2023-04-10 NOTE — Progress Notes (Signed)
Subjective:    [name], female    DOB: 1928-07-05, 89 y.o.   MRN: [id number]  Patient here for  Chief Complaint  Patient presents with   Medical Management of Chronic Issues    HPI Here for a scheduled follow up - follow up regarding CAD, chronic diastolic heart failure and hypertension. S/p pacemaker placement in July. Had f/u with nephrology 03/10/23. Stable. Seeing Dr Tedd Sias - last 11/05/22 - prolia. Reevaluated by cardiology - 03/07/23 - no changes. Continue 240mg  dilt and 150mg  bid lopressor. Continue xarelto. Saw rheumatology - f/u CPPD. Continue cymbalta. Saw Dr Cathie Hoops - 03/26/23 - hgb slightly decreased.  Declined IV venofer. Unable to take oral iron. Recommended iron rich foods.  Following. She comes in today with reports of an episode of dizziness last night. Was sitting, room started spinning. Lasted approximately 30 minutes and then resolved. Went to bed. Woke this am, walked into the bathroom - no dizziness. Had to urinate and after pulling her pants down, reports next thing she knew, she was on the floor. Fell face first onto the floor. Hit the front of her head - just over her eye. Reports pain over the eye and increased neck pain.  No headache now. No chest pain.  Breathing stable.    Past Medical History:  Diagnosis Date   (HFpEF) heart failure with preserved ejection fraction (HCC)    a. 07/2017 Echo: EF 50-55%, no rwma, mild AS/AI, mod MR, mod dil LA, mod TR, PASP ; b. 03/2021 Echo: EF 60-65%. No rwma. Nl RV fxn. Sev BAE. Mild MR. Mild AI/AS.   Atrial fibrillation (HCC)    Autoimmune hepatitis (HCC)    followed by Dr Marva Panda   CHF (congestive heart failure) [facility])    Fibrocystic breast disease    Glaucoma    Hypercholesterolemia    Hypertension    Hypothyroidism    Inflammatory arthritis    MI (myocardial infarction) (HCC)    Mitral regurgitation    a. 07/2017 Echo: Mod MR; b. 08/2017 Cath: 4+/Severe MR; c. 08/2017 TEE: Mild MR; d. 03/2021 Echo: Mild MR.    Neuropathy    Non-obstructive Coronary artery disease    a. 05/2014 NSTEMI/Cath: LM nl, LAD mild dzs, LCX 60ost hazy (? culprit), RCA mild dzs. EF 60% by echo-->Med Rx; b. 08/2017 Cath: LM 30ost, LAD min irregs, LCX small, 40ost/p, RCA large, min irregs, RPDA/RPL1 nl, EF 50-55%. 4+MR.   Osteoarthritis    Permanent atrial fibrillation (HCC)    a. CHA2DS2VASc = 6-->Xarelto.   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   Past Surgical History:  Procedure Laterality Date   ABDOMINAL HYSTERECTOMY  1963   partial, secondary to fibroids   APPENDECTOMY     Billroth     CARDIAC CATHETERIZATION  05/12/14   [facility]   CARDIOVERSION N/A 12/26/2016   Procedure: CARDIOVERSION;  Surgeon: Iran Ouch, MD;  Location: [facility] ORS;  Service: Cardiovascular;  Laterality: N/A;   CARDIOVERSION N/A 05/16/2017   Procedure: CARDIOVERSION;  Surgeon: Iran Ouch, MD;  Location: [facility] ORS;  Service: Cardiovascular;  Laterality: N/A;   CARDIOVERSION N/A 06/09/2017   Procedure: CARDIOVERSION;  Surgeon: Iran Ouch, MD;  Location: [facility] ORS;  Service: Cardiovascular;  Laterality: N/A;   CARDIOVERSION N/A 11/30/2018   Procedure: CARDIOVERSION;  Surgeon: Iran Ouch, MD;  Location: [facility] ORS;  Service: Cardiovascular;  Laterality: N/A;   CHOLECYSTECTOMY  1998  COLONOSCOPY  2009   PACEMAKER IMPLANT N/A 09/28/2021   Procedure: PACEMAKER IMPLANT;  Surgeon: Lanier Prude, MD;  Location: [facility] INVASIVE CV LAB;  Service: Cardiovascular;  Laterality: N/A;   RIGHT/LEFT HEART CATH AND CORONARY ANGIOGRAPHY Bilateral 08/21/2017   Procedure: RIGHT/LEFT HEART CATH AND CORONARY ANGIOGRAPHY;  Surgeon: Iran Ouch, MD;  Location: [facility] INVASIVE CV LAB;  Service: Cardiovascular;  Laterality: Bilateral;   TEE WITHOUT CARDIOVERSION N/A 08/25/2017   Procedure: TRANSESOPHAGEAL ECHOCARDIOGRAM (TEE);  Surgeon: Dolores Patty, MD;  Location: [facility] ENDOSCOPY;  Service:  Cardiovascular;  Laterality: N/A;   UPPER GI ENDOSCOPY  2004   Family History  Problem Relation Age of Onset   Stroke Mother    Esophageal cancer Brother        also had lung cancer   Ovarian cancer Daughter    Colon cancer Daughter    Breast cancer Neg Hx    Social History   Socioeconomic History   Marital status: Widowed    Spouse name: Not on file   Number of children: 2   Years of education: Not on file   Highest education level: Not on file  Occupational History   Occupation: retired  Tobacco Use   Smoking status: Never   Smokeless tobacco: Never   Tobacco comments:    Never smoke 01/23/22  Vaping Use   Vaping status: Never Used  Substance and Sexual Activity   Alcohol use: No    Alcohol/week: 0.0 standard drinks of alcohol   Drug use: No   Sexual activity: Never  Other Topics Concern   Not on file  Social History Narrative   Not on file   Social Drivers of Health   Financial Resource Strain: Low Risk  (06/17/2022)   Overall Financial Resource Strain (CARDIA)    Difficulty of Paying Living Expenses: Not hard at all  Food Insecurity: No Food Insecurity (06/17/2022)   Hunger Vital Sign    Worried About Running Out of Food in the Last Year: Never true    Ran Out of Food in the Last Year: Never true  Transportation Needs: No Transportation Needs (06/17/2022)   PRAPARE - Administrator, Civil Service (Medical): No    Lack of Transportation (Non-Medical): No  Physical Activity: Insufficiently Active (06/17/2022)   Exercise Vital Sign    Days of Exercise per Week: 3 days    Minutes of Exercise per Session: 40 min  Stress: No Stress Concern Present (06/17/2022)   [employer] of Occupational Health - Occupational Stress Questionnaire    Feeling of Stress : Not at all  Social Connections: Unknown (06/17/2022)   Social Connection and Isolation Panel [NHANES]    Frequency of Communication with Friends and Family: More than three times a week    Frequency of  Social Gatherings with Friends and Family: More than three times a week    Attends Religious Services: Not on Marketing executive or Organizations: Not on file    Attends Banker Meetings: Not on file    Marital Status: Not on file     Review of Systems  Constitutional:  Negative for appetite change and unexpected weight change.  HENT:  Negative for congestion and sinus pressure.   Respiratory:  Negative for cough, chest tightness and shortness of breath.   Cardiovascular:  Negative for chest pain and palpitations.  Gastrointestinal:  Negative for abdominal pain, diarrhea, nausea and vomiting.  Genitourinary:  Negative for difficulty  urinating and dysuria.  Skin:  Negative for rash.       Bruising - above right eye.   Neurological:  Positive for dizziness. Negative for headaches.  Psychiatric/Behavioral:  Negative for agitation and dysphoric mood.        Objective:     BP (!) 148/90   Pulse (!) 108   Temp 97.9 F (36.6 C)   Resp 16   Ht 5\' 2"  (1.575 m)   Wt 139 lb 3.2 oz (63.1 kg)   SpO2 99%   BMI 25.46 kg/m  Wt Readings from Last 3 Encounters:  04/10/23 139 lb 3.2 oz (63.1 kg)  03/26/23 139 lb 14.4 oz (63.5 kg)  03/07/23 138 lb 9.6 oz (62.9 kg)    Physical Exam Vitals reviewed.  Constitutional:      General: She is not in acute distress.    Appearance: Normal appearance.  HENT:     Head: Normocephalic.     Comments: Bruising/hematoma - above right eye/eyelid.     Right Ear: External ear normal.     Left Ear: External ear normal.  Eyes:     General: No scleral icterus.       Right eye: No discharge.        Left eye: No discharge.     Conjunctiva/sclera: Conjunctivae normal.  Neck:     Thyroid: No thyromegaly.  Cardiovascular:     Rate and Rhythm: Normal rate. Rhythm irregular.  Pulmonary:     Effort: No respiratory distress.     Breath sounds: Normal breath sounds. No wheezing.  Abdominal:     General: Bowel sounds are normal.      Palpations: Abdomen is soft.     Tenderness: There is no abdominal tenderness.  Musculoskeletal:        General: No swelling or tenderness.     Cervical back: Neck supple. No tenderness.  Lymphadenopathy:     Cervical: No cervical adenopathy.  Skin:    Findings: No erythema or rash.  Neurological:     Mental Status: She is alert.  Psychiatric:        Mood and Affect: Mood normal.        Behavior: Behavior normal.         Outpatient Encounter Medications as of 04/10/2023  Medication Sig   diltiazem (CARDIZEM CD) 240 MG 24 hr capsule Take 240 mg by mouth daily.   dorzolamide (TRUSOPT) 2 % ophthalmic solution Place 1 drop into the left eye 2 (two) times daily.   fluticasone (FLONASE) 50 MCG/ACT nasal spray Place 2 sprays into both nostrils daily as needed for allergies.   fluticasone-salmeterol (ADVAIR HFA) 115-21 MCG/ACT inhaler Inhale 2 puffs into the lungs 2 (two) times daily. (Patient not taking: Reported on 03/26/2023)   gabapentin (NEURONTIN) 300 MG capsule One capsule tid and two capsules q hs.   ipratropium (ATROVENT) 0.03 % nasal spray Place 2 sprays into both nostrils 3 (three) times daily as needed for rhinitis.   latanoprost (XALATAN) 0.005 % ophthalmic solution Place 1 drop into both eyes at bedtime.    levalbuterol (XOPENEX HFA) 45 MCG/ACT inhaler Inhale 1 puff into the lungs every 6 (six) hours as needed for wheezing or shortness of breath. (Patient not taking: Reported on 03/26/2023)   levothyroxine (SYNTHROID) 137 MCG tablet TAKE 1 TABLET(137 MCG) BY MOUTH DAILY BEFORE BREAKFAST   metoprolol tartrate (LOPRESSOR) 100 MG tablet TAKE 1 AND 1/2 TABLETS(150 MG) BY MOUTH TWICE DAILY   Multiple Vitamin (MULTI-VITAMIN) tablet  Take 1 tablet by mouth at bedtime.    Probiotic Product (ALIGN) 4 MG CAPS Take one capsule q day   torsemide (DEMADEX) 20 MG tablet Take 1 tablet (20 mg total) by mouth daily.   traMADol (ULTRAM) 50 MG tablet Take 1 tablet (50 mg total) by mouth 2 (two)  times daily as needed.   ursodiol (ACTIGALL) 300 MG capsule Take 300-600 mg by mouth See admin instructions. Take 2 capsules (600 mg) daily in the morning   & 1 capsule (300 mg) at night.   XARELTO 15 MG TABS tablet TAKE 1 TABLET(15 MG) BY MOUTH DAILY WITH SUPPER   No facility-administered encounter medications on file as of 04/10/2023.     Lab Results  Component Value Date   WBC 6.0 04/10/2023   HGB 12.6 04/10/2023   HCT 41.1 04/10/2023   PLT 203 04/10/2023   GLUCOSE 106 (H) 04/10/2023   CHOL 162 02/03/2023   TRIG 161.0 (H) 02/03/2023   HDL 48.90 02/03/2023   LDLDIRECT 143.5 12/17/2012   LDLCALC 81 02/03/2023   ALT 21 03/24/2023   AST 16 03/24/2023   NA 139 04/10/2023   K 5.1 04/10/2023   CL 107 04/10/2023   CREATININE 1.13 (H) 04/10/2023   BUN 34 (H) 04/10/2023   CO2 21 (L) 04/10/2023   TSH 6.64 (H) 02/03/2023   INR 1.63 12/29/2017   HGBA1C 5.6 01/27/2018    MM 3D SCREENING MAMMOGRAM BILATERAL BREAST Result Date: 01/22/2023 CLINICAL DATA:  Screening. EXAM: DIGITAL SCREENING BILATERAL MAMMOGRAM WITH TOMOSYNTHESIS AND CAD TECHNIQUE: Bilateral screening digital craniocaudal and mediolateral oblique mammograms were obtained. Bilateral screening digital breast tomosynthesis was performed. The images were evaluated with computer-aided detection. COMPARISON:  Previous exam(s). ACR Breast Density Category c: The breasts are heterogeneously dense, which may obscure small masses. FINDINGS: There are no findings suspicious for malignancy. IMPRESSION: No mammographic evidence of malignancy. A result letter of this screening mammogram will be mailed directly to the patient. RECOMMENDATION: Screening mammogram in one year (as long as the patient has a life expectancy of 10+ years). (Code:SM-B-01Y) BI-RADS CATEGORY  1: Negative. Electronically Signed   By: Hulan Saas M.D.   On: 01/22/2023 12:50       Assessment & Plan:  Persistent atrial fibrillation [facility]) Assessment & Plan: Followed  by cardiology - will notice heart rate is up at times.  No chest pain or increased sob.  Recommended continuing current medication regimen. Overall she feels is stable from cardiac standpoint. Follow. Continue dilt 240mg  q day and lopressor 150mg  bid. EKG today - AFib with controlled ventricular rate. Unclear etiology of her fall.  Discussed further ER evaluation.   Orders: -     TSH; Future -     EKG 12-Lead  Thyroiditis, lymphocytic Assessment & Plan: Dr Gerrit Friends 11/21/22 - stable.  F/u one year.  Continue synthroid   Tachy-brady syndrome [facility]) Assessment & Plan: S/p pacemaker placement 09/2021.    Pulmonary hypertension, unspecified (HCC) Assessment & Plan: Followed by cardiology.    Primary biliary cholangitis (HCC) Assessment & Plan: Followed by GI. Stable. Follow liver function tests.     Other polyneuropathy Assessment & Plan: On gabapentin.    Iron deficiency anemia due to chronic blood loss Assessment & Plan: Saw Dr Cathie Hoops - 03/26/23 - hgb slightly decreased.  Declined IV venofer. Unable to take oral iron. Recommended iron rich foods.    Hypothyroidism, unspecified type Assessment & Plan: On thyroid replacement.  Follow tsh.     Hypercholesteremia Assessment &  Plan: Unable to take statin medication.  Follow lipid panel.    Gastroesophageal reflux disease, unspecified whether esophagitis present Assessment & Plan: Upper symptoms controlled.  On protonix.    Stage 3b chronic kidney disease (HCC) Assessment & Plan:  Avoid antiinflammatories.  Follow metabolic panel. Followed by nephrology.     Cirrhosis of liver without ascites, unspecified hepatic cirrhosis type (HCC) Assessment & Plan: Stable on ursodiol.  F/u abdominal ultrasound - Subtle nodularity along the free edge of the liver is stable,  consistent with the history of cirrhosis. No focal hepatic lesions  are present. Mild increased echogenicity of the liver, stable.    Chronic diastolic heart failure  (HCC) Assessment & Plan: Discussed weighing daily.  Taking torsemide daily.  Breathing stable.    Benign meningioma [facility]) Assessment & Plan: Evaluated by Dr Dwyane Luo (oncology [facility]).  States was told no further w/up or f/u neded.  Of note, no mention on MRI 2018.    Benign hypertension Assessment & Plan: On lopressor and diltiazem.  Now taking torsemide daily. Follow pressures. Hold on making any changes.    Autoimmune hepatitis (HCC) Assessment & Plan: Follow liver function tests.  Followed by GI -  stable on ursodiol.  F/u abdominal ultrasound - Subtle nodularity along the free edge of the liver is stable,  consistent with the history of cirrhosis. No focal hepatic lesions  are present. Mild increased echogenicity of the liver, stable.   Atherosclerotic heart disease of native coronary artery with other forms of angina pectoris [facility]) Assessment & Plan: Continue risk factor modification.  Continue metoprolol and xarelto. Overall stable.    Fall, initial encounter Assessment & Plan: Fall as outlined. Unclear etiology. Described dizziness as outlined. Unclear as to exactly what happened this am. Unclear if brief LOC/syncope. EKG as outlined. Given the episode this am and given head injury, discussed the need for further evaluation in ER.  Pt agreeable.  Grandson called and transported her to ER. ER notified.       Dale Millheim, MD

## 2023-04-11 ENCOUNTER — Emergency Department
Admission: EM | Admit: 2023-04-11 | Discharge: 2023-04-11 | Disposition: A | Discharge status: Home / Self Care | Payer: Medicare Other | Attending: Emergency Medicine | Admitting: Emergency Medicine

## 2023-04-11 DIAGNOSIS — S0011XA Contusion of right eyelid and periocular area, initial encounter: Secondary | ICD-10-CM

## 2023-04-11 DIAGNOSIS — S0990XA Unspecified injury of head, initial encounter: Secondary | ICD-10-CM

## 2023-04-11 NOTE — Discharge Instructions (Signed)
 You were seen in the emergency department today after a head injury. Fortunately, your exam and CT scan were overall reassuring. It is still possible that you have a concussion. I have included more information about this in your paperwork. Please follow-up with your primary care doctor within a few days for reevaluation. Return to the ER for any worsening symptoms including worsening headache, confusion, or any other new or concerning symptoms.

## 2023-04-11 NOTE — ED Provider Notes (Signed)
[facility] Provider Note    Event Date/Time   First MD Initiated Contact with Patient 04/11/23 0013     (approximate)   History   Fall   HPI  Ruth Roth is a 89 year old female presenting to the Emergency Department for evaluation after a fall.  Patient was in her bathroom when she fell and hit her right eye.  Denies loss of consciousness.  Is anticoagulated on Xarelto.  Ecchymosis around right eye, denies headache or other complaints.    Physical Exam   Triage Vital Signs: ED Triage Vitals  Encounter Vitals Group     BP 04/10/23 1743 (!) 191/109     Systolic BP Percentile --      Diastolic BP Percentile --      Pulse Rate 04/10/23 1743 (!) 104     Resp 04/10/23 1743 18     Temp 04/10/23 1743 (!) 97.5 F (36.4 C)     Temp Source 04/10/23 1743 Oral     SpO2 04/10/23 1743 100 %     Weight --      Height --      Head Circumference --      Peak Flow --      Pain Score 04/10/23 1749 0     Pain Loc --      Pain Education --      Exclude from Growth Chart --     Most recent vital signs: Vitals:   04/10/23 1743  BP: (!) 191/109  Pulse: (!) 104  Resp: 18  Temp: (!) 97.5 F (36.4 C)  SpO2: 100%   Nursing notes and vital signs reviewed.  General: Adult female, sitting on bed, awake, reactive Head: Ecchymosis around right eye without significant swelling, normal extraocular movements, pupils equal and reactive, no other visible head trauma Chest: Symmetric chest rise, no tenderness to palpation.  Cardiac: Regular rhythm and rate.  Respiratory: Lungs clear to auscultation Abdomen: Soft, nondistended. No tenderness to palpation.  MSK: No deformity to bilateral upper and lower extremity. Full range of motion to bilateral upper lower extremity with no pain. Neuro: Alert, oriented. GCS 15. Normal sensation to light touch in bilateral upper and lower extremity. Skin: No evidence of burns or lacerations. ED Results / Procedures / Treatments    Labs (all labs ordered are listed, but only abnormal results are displayed) Labs Reviewed  BASIC METABOLIC PANEL - Abnormal; Notable for the following components:      Result Value   CO2 21 (*)    Glucose, Bld 106 (*)    BUN 34 (*)    Creatinine, Ser 1.13 (*)    GFR, Estimated 46 (*)    All other components within normal limits  CBC WITH DIFFERENTIAL/PLATELET - Abnormal; Notable for the following components:   RDW 16.7 (*)    All other components within normal limits     EKG EKG independently reviewed interpreted by myself (ER attending) demonstrates:  EKG demonstrates suspected sinus rhythm with P waves most appreciable in lead V1 at a rate of 105, QRS 78, QTc 459, no acute ST changes  RADIOLOGY Imaging independently reviewed and interpreted by myself demonstrates:  CT head without acute bleed CT max face without acute fracture CT C-spine without acute fracture  PROCEDURES:  Critical Care performed: No  Procedures   MEDICATIONS ORDERED IN ED: Medications - No data to display   IMPRESSION / MDM / ASSESSMENT AND PLAN / ED COURSE  I reviewed the triage  vital signs and the nursing notes.  Differential diagnosis includes, but is not limited to, intracranial bleed, skull fracture, spine fracture, no evidence of thoracoabdominal trauma  Patient's presentation is most consistent with acute presentation with potential threat to life or bodily function.  89 year old female presenting to the ER for evaluation after a fall.  Labs sent from triage without critical derangements.  CT imaging fortunately without significant traumatic injury.  No other evidence of trauma on exam.  Patient eager to be discharged home.  Do think she is stable for discharge.  Strict return precautions provided.      FINAL CLINICAL IMPRESSION(S) / ED DIAGNOSES   Final diagnoses:  Closed head injury, initial encounter  Periorbital ecchymosis of right eye, initial encounter     Rx / DC Orders    ED Discharge Orders     None        Note:  This document was prepared using Dragon voice recognition software and may include unintentional dictation errors.   Trinna Post, MD 04/11/23 [phone]

## 2023-04-13 ENCOUNTER — Encounter: Payer: Self-pay | Admitting: Internal Medicine

## 2023-04-13 DIAGNOSIS — W19XXXA Unspecified fall, initial encounter: Secondary | ICD-10-CM | POA: Insufficient documentation

## 2023-04-13 NOTE — Assessment & Plan Note (Signed)
Upper symptoms controlled.  On protonix.  

## 2023-04-13 NOTE — Assessment & Plan Note (Signed)
 Follow liver function tests.  Followed by GI -  stable on ursodiol.  F/u abdominal ultrasound - Subtle nodularity along the free edge of the liver is stable,  consistent with the history of cirrhosis. No focal hepatic lesions  are present. Mild increased echogenicity of the liver, stable.

## 2023-04-13 NOTE — Assessment & Plan Note (Signed)
Followed by cardiology - will notice heart rate is up at times.  No chest pain or increased sob.  Recommended continuing current medication regimen. Overall she feels is stable from cardiac standpoint. Follow. Continue dilt 240mg  q day and lopressor 150mg  bid. EKG today - AFib with controlled ventricular rate. Unclear etiology of her fall.  Discussed further ER evaluation.

## 2023-04-13 NOTE — Assessment & Plan Note (Signed)
 Followed by cardiology

## 2023-04-13 NOTE — Assessment & Plan Note (Signed)
 Avoid antiinflammatories.  Follow metabolic panel. Followed by nephrology.

## 2023-04-13 NOTE — Assessment & Plan Note (Signed)
 Unable to take statin medication.  Follow lipid panel.

## 2023-04-13 NOTE — Assessment & Plan Note (Signed)
 On gabapentin.

## 2023-04-13 NOTE — Assessment & Plan Note (Signed)
 Stable on ursodiol.  F/u abdominal ultrasound - Subtle nodularity along the free edge of the liver is stable,  consistent with the history of cirrhosis. No focal hepatic lesions  are present. Mild increased echogenicity of the liver, stable.

## 2023-04-13 NOTE — Assessment & Plan Note (Signed)
 On thyroid replacement.  Follow tsh.

## 2023-04-13 NOTE — Assessment & Plan Note (Signed)
Fall as outlined. Unclear etiology. Described dizziness as outlined. Unclear as to exactly what happened this am. Unclear if brief LOC/syncope. EKG as outlined. Given the episode this am and given head injury, discussed the need for further evaluation in ER.  Pt agreeable.  Grandson called and transported her to ER. ER notified.

## 2023-04-13 NOTE — Assessment & Plan Note (Signed)
 S/p pacemaker placement 09/2021.

## 2023-04-13 NOTE — Assessment & Plan Note (Signed)
 Continue risk factor modification.  Continue metoprolol and xarelto. Overall stable.

## 2023-04-13 NOTE — Assessment & Plan Note (Signed)
 Followed by GI.  Stable.  Follow liver function tests.

## 2023-04-13 NOTE — Assessment & Plan Note (Signed)
 On lopressor and diltiazem.  Now taking torsemide daily. Follow pressures. Hold on making any changes.

## 2023-04-13 NOTE — Assessment & Plan Note (Signed)
Saw Dr Cathie Hoops - 03/26/23 - hgb slightly decreased.  Declined IV venofer. Unable to take oral iron. Recommended iron rich foods.

## 2023-04-13 NOTE — Assessment & Plan Note (Signed)
 Discussed weighing daily.  Taking torsemide daily.  Breathing stable.

## 2023-04-13 NOTE — Assessment & Plan Note (Signed)
 Evaluated by Dr Dwyane Luo (oncology [facility]).  States was told no further w/up or f/u neded.  Of note, no mention on MRI 2018.

## 2023-04-13 NOTE — Assessment & Plan Note (Signed)
 Dr Gerrit Friends 11/21/22 - stable.  F/u one year.  Continue synthroid

## 2023-04-15 ENCOUNTER — Telehealth: Payer: Self-pay

## 2023-04-15 NOTE — Transitions of Care (Post Inpatient/ED Visit) (Signed)
   04/15/2023  Name: NABA SNEED MRN: [id number] DOB: 1929-02-12  Today's TOC FU Call Status: Today's TOC FU Call Status:: Unsuccessful Call (1st Attempt) Unsuccessful Call (1st Attempt) Date: 04/15/23  Attempted to reach the patient regarding the most recent Inpatient/ED visit.  Follow Up Plan: Additional outreach attempts will be made to reach the patient to complete the Transitions of Care (Post Inpatient/ED visit) call.   Ramani Riva, CMA  [facility] AWV Team Direct Dial: [phone]

## 2023-04-16 NOTE — Transitions of Care (Post Inpatient/ED Visit) (Signed)
   04/16/2023  Name: Ruth Roth MRN: [id number] DOB: 1928/08/21  Today's TOC FU Call Status: Today's TOC FU Call Status:: Unsuccessful Call (2nd Attempt) Unsuccessful Call (1st Attempt) Date: 04/15/23 Unsuccessful Call (2nd Attempt) Date: 04/16/23  Attempted to reach the patient regarding the most recent Inpatient/ED visit.  Follow Up Plan: Additional outreach attempts will be made to reach the patient to complete the Transitions of Care (Post Inpatient/ED visit) call.   Jaymarie Yeakel, CMA  [facility] AW[facility] Team Direct Dial: [phone]

## 2023-05-09 NOTE — Progress Notes (Signed)
 Remote pacemaker transmission.

## 2023-05-09 NOTE — Addendum Note (Signed)
 Addended by: Geralyn Flash D on: 05/09/2023 12:15 PM   Modules accepted: Orders

## 2023-05-13 ENCOUNTER — Inpatient Hospital Stay (HOSPITAL_COMMUNITY): Admitting: Critical Care Medicine

## 2023-05-13 ENCOUNTER — Encounter (HOSPITAL_COMMUNITY): Admission: EM | Disposition: E | Payer: Self-pay | Source: Home / Self Care | Attending: Internal Medicine

## 2023-05-13 ENCOUNTER — Inpatient Hospital Stay (HOSPITAL_COMMUNITY)
Admission: EM | Admit: 2023-05-13 | Discharge: 2023-06-10 | DRG: 023 | Disposition: E | Attending: Internal Medicine | Admitting: Internal Medicine

## 2023-05-13 ENCOUNTER — Inpatient Hospital Stay (HOSPITAL_COMMUNITY)

## 2023-05-13 ENCOUNTER — Emergency Department (HOSPITAL_COMMUNITY)

## 2023-05-13 ENCOUNTER — Encounter (HOSPITAL_COMMUNITY): Payer: Self-pay | Admitting: Emergency Medicine

## 2023-05-13 DIAGNOSIS — G9389 Other specified disorders of brain: Secondary | ICD-10-CM | POA: Diagnosis not present

## 2023-05-13 DIAGNOSIS — E8809 Other disorders of plasma-protein metabolism, not elsewhere classified: Secondary | ICD-10-CM | POA: Diagnosis present

## 2023-05-13 DIAGNOSIS — I5032 Chronic diastolic (congestive) heart failure: Secondary | ICD-10-CM | POA: Diagnosis present

## 2023-05-13 DIAGNOSIS — Z8673 Personal history of transient ischemic attack (TIA), and cerebral infarction without residual deficits: Secondary | ICD-10-CM | POA: Diagnosis not present

## 2023-05-13 DIAGNOSIS — I639 Cerebral infarction, unspecified: Secondary | ICD-10-CM | POA: Diagnosis not present

## 2023-05-13 DIAGNOSIS — N183 Chronic kidney disease, stage 3 unspecified: Secondary | ICD-10-CM | POA: Diagnosis not present

## 2023-05-13 DIAGNOSIS — W19XXXA Unspecified fall, initial encounter: Secondary | ICD-10-CM | POA: Diagnosis present

## 2023-05-13 DIAGNOSIS — N189 Chronic kidney disease, unspecified: Secondary | ICD-10-CM | POA: Diagnosis present

## 2023-05-13 DIAGNOSIS — I484 Atypical atrial flutter: Secondary | ICD-10-CM | POA: Diagnosis not present

## 2023-05-13 DIAGNOSIS — D696 Thrombocytopenia, unspecified: Secondary | ICD-10-CM | POA: Diagnosis not present

## 2023-05-13 DIAGNOSIS — N3000 Acute cystitis without hematuria: Secondary | ICD-10-CM | POA: Diagnosis not present

## 2023-05-13 DIAGNOSIS — R4701 Aphasia: Secondary | ICD-10-CM | POA: Diagnosis present

## 2023-05-13 DIAGNOSIS — E039 Hypothyroidism, unspecified: Secondary | ICD-10-CM | POA: Diagnosis present

## 2023-05-13 DIAGNOSIS — I4821 Permanent atrial fibrillation: Secondary | ICD-10-CM | POA: Diagnosis not present

## 2023-05-13 DIAGNOSIS — N1832 Chronic kidney disease, stage 3b: Secondary | ICD-10-CM | POA: Diagnosis present

## 2023-05-13 DIAGNOSIS — A419 Sepsis, unspecified organism: Secondary | ICD-10-CM | POA: Diagnosis not present

## 2023-05-13 DIAGNOSIS — N170 Acute kidney failure with tubular necrosis: Secondary | ICD-10-CM | POA: Diagnosis not present

## 2023-05-13 DIAGNOSIS — I2510 Atherosclerotic heart disease of native coronary artery without angina pectoris: Secondary | ICD-10-CM

## 2023-05-13 DIAGNOSIS — M19011 Primary osteoarthritis, right shoulder: Secondary | ICD-10-CM | POA: Diagnosis not present

## 2023-05-13 DIAGNOSIS — S4991XA Unspecified injury of right shoulder and upper arm, initial encounter: Secondary | ICD-10-CM | POA: Diagnosis not present

## 2023-05-13 DIAGNOSIS — J9601 Acute respiratory failure with hypoxia: Secondary | ICD-10-CM | POA: Diagnosis not present

## 2023-05-13 DIAGNOSIS — I739 Peripheral vascular disease, unspecified: Secondary | ICD-10-CM | POA: Diagnosis present

## 2023-05-13 DIAGNOSIS — I5033 Acute on chronic diastolic (congestive) heart failure: Secondary | ICD-10-CM | POA: Diagnosis not present

## 2023-05-13 DIAGNOSIS — Z823 Family history of stroke: Secondary | ICD-10-CM

## 2023-05-13 DIAGNOSIS — I4819 Other persistent atrial fibrillation: Secondary | ICD-10-CM | POA: Diagnosis not present

## 2023-05-13 DIAGNOSIS — J45909 Unspecified asthma, uncomplicated: Secondary | ICD-10-CM | POA: Diagnosis not present

## 2023-05-13 DIAGNOSIS — D631 Anemia in chronic kidney disease: Secondary | ICD-10-CM | POA: Diagnosis present

## 2023-05-13 DIAGNOSIS — I63412 Cerebral infarction due to embolism of left middle cerebral artery: Secondary | ICD-10-CM | POA: Diagnosis present

## 2023-05-13 DIAGNOSIS — Z79890 Hormone replacement therapy (postmenopausal): Secondary | ICD-10-CM

## 2023-05-13 DIAGNOSIS — I4892 Unspecified atrial flutter: Secondary | ICD-10-CM

## 2023-05-13 DIAGNOSIS — Z950 Presence of cardiac pacemaker: Secondary | ICD-10-CM

## 2023-05-13 DIAGNOSIS — Z9049 Acquired absence of other specified parts of digestive tract: Secondary | ICD-10-CM

## 2023-05-13 DIAGNOSIS — R4182 Altered mental status, unspecified: Principal | ICD-10-CM

## 2023-05-13 DIAGNOSIS — R1310 Dysphagia, unspecified: Secondary | ICD-10-CM | POA: Diagnosis present

## 2023-05-13 DIAGNOSIS — I672 Cerebral atherosclerosis: Secondary | ICD-10-CM | POA: Diagnosis not present

## 2023-05-13 DIAGNOSIS — K279 Peptic ulcer, site unspecified, unspecified as acute or chronic, without hemorrhage or perforation: Secondary | ICD-10-CM | POA: Diagnosis not present

## 2023-05-13 DIAGNOSIS — N390 Urinary tract infection, site not specified: Secondary | ICD-10-CM

## 2023-05-13 DIAGNOSIS — K7290 Hepatic failure, unspecified without coma: Secondary | ICD-10-CM | POA: Diagnosis not present

## 2023-05-13 DIAGNOSIS — S3993XA Unspecified injury of pelvis, initial encounter: Secondary | ICD-10-CM | POA: Diagnosis not present

## 2023-05-13 DIAGNOSIS — R29721 NIHSS score 21: Secondary | ICD-10-CM | POA: Diagnosis present

## 2023-05-13 DIAGNOSIS — I2489 Other forms of acute ischemic heart disease: Secondary | ICD-10-CM | POA: Diagnosis present

## 2023-05-13 DIAGNOSIS — I130 Hypertensive heart and chronic kidney disease with heart failure and stage 1 through stage 4 chronic kidney disease, or unspecified chronic kidney disease: Secondary | ICD-10-CM | POA: Diagnosis not present

## 2023-05-13 DIAGNOSIS — Z66 Do not resuscitate: Secondary | ICD-10-CM | POA: Diagnosis not present

## 2023-05-13 DIAGNOSIS — Z978 Presence of other specified devices: Secondary | ICD-10-CM

## 2023-05-13 DIAGNOSIS — E871 Hypo-osmolality and hyponatremia: Secondary | ICD-10-CM | POA: Diagnosis present

## 2023-05-13 DIAGNOSIS — E7800 Pure hypercholesterolemia, unspecified: Secondary | ICD-10-CM | POA: Diagnosis present

## 2023-05-13 DIAGNOSIS — R6520 Severe sepsis without septic shock: Secondary | ICD-10-CM | POA: Diagnosis not present

## 2023-05-13 DIAGNOSIS — R29818 Other symptoms and signs involving the nervous system: Secondary | ICD-10-CM | POA: Diagnosis not present

## 2023-05-13 DIAGNOSIS — Z7189 Other specified counseling: Secondary | ICD-10-CM | POA: Diagnosis not present

## 2023-05-13 DIAGNOSIS — R54 Age-related physical debility: Secondary | ICD-10-CM | POA: Diagnosis present

## 2023-05-13 DIAGNOSIS — G319 Degenerative disease of nervous system, unspecified: Secondary | ICD-10-CM | POA: Diagnosis not present

## 2023-05-13 DIAGNOSIS — Y92009 Unspecified place in unspecified non-institutional (private) residence as the place of occurrence of the external cause: Secondary | ICD-10-CM | POA: Diagnosis not present

## 2023-05-13 DIAGNOSIS — I483 Typical atrial flutter: Secondary | ICD-10-CM

## 2023-05-13 DIAGNOSIS — N179 Acute kidney failure, unspecified: Secondary | ICD-10-CM | POA: Insufficient documentation

## 2023-05-13 DIAGNOSIS — E8779 Other fluid overload: Secondary | ICD-10-CM | POA: Diagnosis not present

## 2023-05-13 DIAGNOSIS — I63512 Cerebral infarction due to unspecified occlusion or stenosis of left middle cerebral artery: Secondary | ICD-10-CM | POA: Diagnosis not present

## 2023-05-13 DIAGNOSIS — Z515 Encounter for palliative care: Secondary | ICD-10-CM

## 2023-05-13 DIAGNOSIS — Z90710 Acquired absence of both cervix and uterus: Secondary | ICD-10-CM

## 2023-05-13 DIAGNOSIS — I6521 Occlusion and stenosis of right carotid artery: Secondary | ICD-10-CM | POA: Diagnosis not present

## 2023-05-13 DIAGNOSIS — I080 Rheumatic disorders of both mitral and aortic valves: Secondary | ICD-10-CM | POA: Diagnosis not present

## 2023-05-13 DIAGNOSIS — E440 Moderate protein-calorie malnutrition: Secondary | ICD-10-CM | POA: Diagnosis present

## 2023-05-13 DIAGNOSIS — I472 Ventricular tachycardia, unspecified: Secondary | ICD-10-CM | POA: Diagnosis present

## 2023-05-13 DIAGNOSIS — K7460 Unspecified cirrhosis of liver: Secondary | ICD-10-CM | POA: Diagnosis present

## 2023-05-13 DIAGNOSIS — G9341 Metabolic encephalopathy: Secondary | ICD-10-CM | POA: Diagnosis not present

## 2023-05-13 DIAGNOSIS — G936 Cerebral edema: Secondary | ICD-10-CM | POA: Diagnosis not present

## 2023-05-13 DIAGNOSIS — M6282 Rhabdomyolysis: Secondary | ICD-10-CM | POA: Diagnosis not present

## 2023-05-13 DIAGNOSIS — E872 Acidosis, unspecified: Secondary | ICD-10-CM | POA: Diagnosis present

## 2023-05-13 DIAGNOSIS — Z7982 Long term (current) use of aspirin: Secondary | ICD-10-CM

## 2023-05-13 DIAGNOSIS — N281 Cyst of kidney, acquired: Secondary | ICD-10-CM | POA: Diagnosis not present

## 2023-05-13 DIAGNOSIS — Z888 Allergy status to other drugs, medicaments and biological substances status: Secondary | ICD-10-CM

## 2023-05-13 DIAGNOSIS — R062 Wheezing: Secondary | ICD-10-CM | POA: Diagnosis not present

## 2023-05-13 DIAGNOSIS — I728 Aneurysm of other specified arteries: Secondary | ICD-10-CM | POA: Diagnosis not present

## 2023-05-13 DIAGNOSIS — I4891 Unspecified atrial fibrillation: Secondary | ICD-10-CM | POA: Diagnosis not present

## 2023-05-13 DIAGNOSIS — R000 Tachycardia, unspecified: Secondary | ICD-10-CM | POA: Diagnosis not present

## 2023-05-13 DIAGNOSIS — I252 Old myocardial infarction: Secondary | ICD-10-CM

## 2023-05-13 DIAGNOSIS — L89322 Pressure ulcer of left buttock, stage 2: Secondary | ICD-10-CM | POA: Diagnosis present

## 2023-05-13 DIAGNOSIS — J811 Chronic pulmonary edema: Secondary | ICD-10-CM | POA: Diagnosis not present

## 2023-05-13 DIAGNOSIS — D649 Anemia, unspecified: Secondary | ICD-10-CM | POA: Diagnosis not present

## 2023-05-13 DIAGNOSIS — R9082 White matter disease, unspecified: Secondary | ICD-10-CM | POA: Diagnosis not present

## 2023-05-13 DIAGNOSIS — E870 Hyperosmolality and hypernatremia: Secondary | ICD-10-CM | POA: Diagnosis not present

## 2023-05-13 DIAGNOSIS — I63422 Cerebral infarction due to embolism of left anterior cerebral artery: Secondary | ICD-10-CM | POA: Diagnosis not present

## 2023-05-13 DIAGNOSIS — R918 Other nonspecific abnormal finding of lung field: Secondary | ICD-10-CM | POA: Diagnosis not present

## 2023-05-13 DIAGNOSIS — G8191 Hemiplegia, unspecified affecting right dominant side: Secondary | ICD-10-CM | POA: Diagnosis present

## 2023-05-13 DIAGNOSIS — I6602 Occlusion and stenosis of left middle cerebral artery: Secondary | ICD-10-CM | POA: Diagnosis present

## 2023-05-13 DIAGNOSIS — A4159 Other Gram-negative sepsis: Secondary | ICD-10-CM | POA: Diagnosis not present

## 2023-05-13 DIAGNOSIS — I129 Hypertensive chronic kidney disease with stage 1 through stage 4 chronic kidney disease, or unspecified chronic kidney disease: Secondary | ICD-10-CM | POA: Diagnosis not present

## 2023-05-13 DIAGNOSIS — R29810 Facial weakness: Secondary | ICD-10-CM | POA: Diagnosis present

## 2023-05-13 DIAGNOSIS — Z1152 Encounter for screening for COVID-19: Secondary | ICD-10-CM

## 2023-05-13 DIAGNOSIS — Z7901 Long term (current) use of anticoagulants: Secondary | ICD-10-CM

## 2023-05-13 DIAGNOSIS — S299XXA Unspecified injury of thorax, initial encounter: Secondary | ICD-10-CM | POA: Diagnosis not present

## 2023-05-13 DIAGNOSIS — Z8679 Personal history of other diseases of the circulatory system: Secondary | ICD-10-CM

## 2023-05-13 DIAGNOSIS — Z931 Gastrostomy status: Secondary | ICD-10-CM | POA: Diagnosis not present

## 2023-05-13 DIAGNOSIS — Z6824 Body mass index (BMI) 24.0-24.9, adult: Secondary | ICD-10-CM

## 2023-05-13 DIAGNOSIS — I499 Cardiac arrhythmia, unspecified: Secondary | ICD-10-CM | POA: Diagnosis not present

## 2023-05-13 DIAGNOSIS — Z4682 Encounter for fitting and adjustment of non-vascular catheter: Secondary | ICD-10-CM | POA: Diagnosis not present

## 2023-05-13 DIAGNOSIS — Z8711 Personal history of peptic ulcer disease: Secondary | ICD-10-CM

## 2023-05-13 DIAGNOSIS — J90 Pleural effusion, not elsewhere classified: Secondary | ICD-10-CM | POA: Diagnosis not present

## 2023-05-13 DIAGNOSIS — Z79899 Other long term (current) drug therapy: Secondary | ICD-10-CM

## 2023-05-13 DIAGNOSIS — R930 Abnormal findings on diagnostic imaging of skull and head, not elsewhere classified: Secondary | ICD-10-CM | POA: Diagnosis not present

## 2023-05-13 DIAGNOSIS — Z91041 Radiographic dye allergy status: Secondary | ICD-10-CM

## 2023-05-13 DIAGNOSIS — R34 Anuria and oliguria: Secondary | ICD-10-CM | POA: Diagnosis not present

## 2023-05-13 DIAGNOSIS — R0602 Shortness of breath: Secondary | ICD-10-CM | POA: Diagnosis not present

## 2023-05-13 DIAGNOSIS — R7989 Other specified abnormal findings of blood chemistry: Secondary | ICD-10-CM

## 2023-05-13 DIAGNOSIS — I700 Atherosclerosis of aorta: Secondary | ICD-10-CM | POA: Diagnosis not present

## 2023-05-13 DIAGNOSIS — L89312 Pressure ulcer of right buttock, stage 2: Secondary | ICD-10-CM | POA: Diagnosis present

## 2023-05-13 DIAGNOSIS — R0600 Dyspnea, unspecified: Secondary | ICD-10-CM | POA: Diagnosis not present

## 2023-05-13 DIAGNOSIS — I441 Atrioventricular block, second degree: Secondary | ICD-10-CM | POA: Diagnosis present

## 2023-05-13 DIAGNOSIS — I5031 Acute diastolic (congestive) heart failure: Secondary | ICD-10-CM | POA: Diagnosis not present

## 2023-05-13 DIAGNOSIS — R0902 Hypoxemia: Secondary | ICD-10-CM | POA: Diagnosis not present

## 2023-05-13 DIAGNOSIS — Z7951 Long term (current) use of inhaled steroids: Secondary | ICD-10-CM

## 2023-05-13 DIAGNOSIS — J810 Acute pulmonary edema: Secondary | ICD-10-CM | POA: Diagnosis not present

## 2023-05-13 DIAGNOSIS — K754 Autoimmune hepatitis: Secondary | ICD-10-CM | POA: Diagnosis present

## 2023-05-13 HISTORY — PX: RADIOLOGY WITH ANESTHESIA: SHX6223

## 2023-05-13 LAB — TROPONIN I (HIGH SENSITIVITY)
Troponin I (High Sensitivity): 125 ng/L (ref <18)
Troponin I (High Sensitivity): 143 ng/L (ref <18)

## 2023-05-13 LAB — I-STAT CHEM 8, ED
BUN: 31 mg/dL — ABNORMAL HIGH (ref 8–23)
Calcium, Ion: 1.13 mmol/L — ABNORMAL LOW (ref 1.15–1.40)
Chloride: 113 mmol/L — ABNORMAL HIGH (ref 98–111)
Creatinine, Ser: 1.1 mg/dL — ABNORMAL HIGH (ref 0.44–1.00)
Glucose, Bld: 135 mg/dL — ABNORMAL HIGH (ref 70–99)
HCT: 49 % — ABNORMAL HIGH (ref 36.0–46.0)
Hemoglobin: 16.7 g/dL — ABNORMAL HIGH (ref 12.0–15.0)
Potassium: 4.1 mmol/L (ref 3.5–5.1)
Sodium: 143 mmol/L (ref 135–145)
TCO2: 18 mmol/L — ABNORMAL LOW (ref 22–32)

## 2023-05-13 LAB — COMPREHENSIVE METABOLIC PANEL
ALT: 50 U/L — ABNORMAL HIGH (ref 0–44)
AST: 74 U/L — ABNORMAL HIGH (ref 15–41)
Albumin: 3.8 g/dL (ref 3.5–5.0)
Alkaline Phosphatase: 98 U/L (ref 38–126)
Anion gap: 15 (ref 5–15)
BUN: 29 mg/dL — ABNORMAL HIGH (ref 8–23)
CO2: 17 mmol/L — ABNORMAL LOW (ref 22–32)
Calcium: 9.5 mg/dL (ref 8.9–10.3)
Chloride: 109 mmol/L (ref 98–111)
Creatinine, Ser: 1.24 mg/dL — ABNORMAL HIGH (ref 0.44–1.00)
GFR, Estimated: 41 mL/min — ABNORMAL LOW (ref >60)
Glucose, Bld: 138 mg/dL — ABNORMAL HIGH (ref 70–99)
Potassium: 4.1 mmol/L (ref 3.5–5.1)
Sodium: 141 mmol/L (ref 135–145)
Total Bilirubin: 1.3 mg/dL — ABNORMAL HIGH (ref 0.0–1.2)
Total Protein: 6.8 g/dL (ref 6.5–8.1)

## 2023-05-13 LAB — I-STAT VENOUS BLOOD GAS, ED
Acid-base deficit: 6 mmol/L — ABNORMAL HIGH (ref 0.0–2.0)
Bicarbonate: 16.9 mmol/L — ABNORMAL LOW (ref 20.0–28.0)
Calcium, Ion: 1.08 mmol/L — ABNORMAL LOW (ref 1.15–1.40)
HCT: 45 % (ref 36.0–46.0)
Hemoglobin: 15.3 g/dL — ABNORMAL HIGH (ref 12.0–15.0)
O2 Saturation: 73 %
Potassium: 3.8 mmol/L (ref 3.5–5.1)
Sodium: 144 mmol/L (ref 135–145)
TCO2: 18 mmol/L — ABNORMAL LOW (ref 22–32)
pCO2, Ven: 25.8 mmHg — ABNORMAL LOW (ref 44–60)
pH, Ven: 7.424 (ref 7.25–7.43)
pO2, Ven: 37 mmHg (ref 32–45)

## 2023-05-13 LAB — ETHANOL: Alcohol, Ethyl (B): <10 mg/dL (ref <10)

## 2023-05-13 LAB — POC OCCULT BLOOD, ED: Fecal Occult Bld: POSITIVE — AB

## 2023-05-13 LAB — URINALYSIS, ROUTINE W REFLEX MICROSCOPIC
Bilirubin Urine: NEGATIVE
Glucose, UA: NEGATIVE mg/dL
Ketones, ur: 20 mg/dL — AB
Nitrite: POSITIVE — AB
Protein, ur: >=300 mg/dL — AB
Specific Gravity, Urine: 1.03 (ref 1.005–1.030)
WBC, UA: >50 WBC/hpf (ref 0–5)
pH: 5 (ref 5.0–8.0)

## 2023-05-13 LAB — CBC
HCT: 49.2 % — ABNORMAL HIGH (ref 36.0–46.0)
Hemoglobin: 15.6 g/dL — ABNORMAL HIGH (ref 12.0–15.0)
MCH: 31.1 pg (ref 26.0–34.0)
MCHC: 31.7 g/dL (ref 30.0–36.0)
MCV: 98 fL (ref 80.0–100.0)
Platelets: 206 10*3/uL (ref 150–400)
RBC: 5.02 MIL/uL (ref 3.87–5.11)
RDW: 18.2 % — ABNORMAL HIGH (ref 11.5–15.5)
WBC: 14 10*3/uL — ABNORMAL HIGH (ref 4.0–10.5)
nRBC: 0 % (ref 0.0–0.2)

## 2023-05-13 LAB — SAMPLE TO BLOOD BANK

## 2023-05-13 LAB — AMMONIA: Ammonia: 17 umol/L (ref 9–35)

## 2023-05-13 LAB — BRAIN NATRIURETIC PEPTIDE: B Natriuretic Peptide: 673.8 pg/mL — ABNORMAL HIGH (ref 0.0–100.0)

## 2023-05-13 LAB — I-STAT CG4 LACTIC ACID, ED: Lactic Acid, Venous: 3.2 mmol/L (ref 0.5–1.9)

## 2023-05-13 LAB — PROTIME-INR
INR: 1.2 (ref 0.8–1.2)
Prothrombin Time: 14.9 s (ref 11.4–15.2)

## 2023-05-13 LAB — CK: Total CK: 1135 U/L — ABNORMAL HIGH (ref 38–234)

## 2023-05-13 LAB — T4, FREE: Free T4: 0.65 ng/dL (ref 0.61–1.12)

## 2023-05-13 LAB — TSH: TSH: 8.301 u[IU]/mL — ABNORMAL HIGH (ref 0.350–4.500)

## 2023-05-13 LAB — CBG MONITORING, ED: Glucose-Capillary: 150 mg/dL — ABNORMAL HIGH (ref 70–99)

## 2023-05-13 LAB — MAGNESIUM: Magnesium: 2 mg/dL (ref 1.7–2.4)

## 2023-05-13 SURGERY — RADIOLOGY WITH ANESTHESIA
Anesthesia: General

## 2023-05-13 MED ORDER — PHENYLEPHRINE HCL-NACL 20-0.9 MG/250ML-% IV SOLN
INTRAVENOUS | Status: DC | PRN
  Administered 2023-05-13: 15 ug/min via INTRAVENOUS

## 2023-05-13 MED ORDER — IOHEXOL 350 MG/ML SOLN
100.0000 mL | Freq: Once | INTRAVENOUS | Status: AC | PRN
  Administered 2023-05-13: 100 mL via INTRAVENOUS

## 2023-05-13 MED ORDER — SODIUM CHLORIDE 0.9 % IV SOLN
1.0000 g | Freq: Once | INTRAVENOUS | Status: AC
  Administered 2023-05-13: 1 g via INTRAVENOUS
  Filled 2023-05-13: qty 10

## 2023-05-13 MED ORDER — METOPROLOL TARTRATE 5 MG/5ML IV SOLN
5.0000 mg | Freq: Four times a day (QID) | INTRAVENOUS | Status: DC
  Administered 2023-05-13 – 2023-05-14 (×4): 5 mg via INTRAVENOUS
  Filled 2023-05-13 (×4): qty 5

## 2023-05-13 MED ORDER — BISACODYL 10 MG RE SUPP: 10.0000 mg | Freq: Every day | RECTAL | Status: DC | PRN

## 2023-05-13 MED ORDER — DILTIAZEM HCL 25 MG/5ML IV SOLN
20.0000 mg | Freq: Once | INTRAVENOUS | Status: AC
  Administered 2023-05-13: 20 mg via INTRAVENOUS
  Filled 2023-05-13: qty 5

## 2023-05-13 MED ORDER — SODIUM CHLORIDE 0.9 % IV SOLN: INTRAVENOUS | Status: AC

## 2023-05-13 MED ORDER — AMIODARONE LOAD VIA INFUSION
150.0000 mg | Freq: Once | INTRAVENOUS | Status: AC
  Administered 2023-05-13: 150 mg via INTRAVENOUS
  Filled 2023-05-13: qty 83.34

## 2023-05-13 MED ORDER — IOHEXOL 300 MG/ML  SOLN
150.0000 mL | Freq: Once | INTRAMUSCULAR | Status: AC | PRN
Start: 2023-05-13 — End: 2023-05-14
  Administered 2023-05-14: 150 mL via INTRA_ARTERIAL

## 2023-05-13 MED ORDER — MOMETASONE FURO-FORMOTEROL FUM 200-5 MCG/ACT IN AERO
2.0000 | INHALATION_SPRAY | Freq: Two times a day (BID) | RESPIRATORY_TRACT | Status: DC
  Administered 2023-05-15: 2 via RESPIRATORY_TRACT
  Filled 2023-05-13: qty 8.8

## 2023-05-13 MED ORDER — FENTANYL CITRATE PF 50 MCG/ML IJ SOSY
50.0000 ug | PREFILLED_SYRINGE | Freq: Once | INTRAMUSCULAR | Status: AC
  Administered 2023-05-13: 50 ug via INTRAVENOUS
  Filled 2023-05-13: qty 1

## 2023-05-13 MED ORDER — SODIUM CHLORIDE 0.9 % IV SOLN
1.0000 g | INTRAVENOUS | Status: AC
  Administered 2023-05-14 – 2023-05-15 (×2): 1 g via INTRAVENOUS
  Filled 2023-05-13 (×2): qty 10

## 2023-05-13 MED ORDER — LACTATED RINGERS IV BOLUS
1000.0000 mL | Freq: Once | INTRAVENOUS | Status: AC
  Administered 2023-05-14: 1000 mL via INTRAVENOUS

## 2023-05-13 MED ORDER — NITROGLYCERIN 1 MG/10 ML FOR IR/CATH LAB
INTRA_ARTERIAL | Status: AC
  Filled 2023-05-13: qty 10

## 2023-05-13 MED ORDER — SODIUM CHLORIDE 0.9 % IV BOLUS
1000.0000 mL | Freq: Once | INTRAVENOUS | Status: AC
  Administered 2023-05-13: 1000 mL via INTRAVENOUS

## 2023-05-13 MED ORDER — SENNOSIDES-DOCUSATE SODIUM 8.6-50 MG PO TABS: 1.0000 | ORAL_TABLET | Freq: Every evening | ORAL | Status: DC | PRN

## 2023-05-13 MED ORDER — SUCCINYLCHOLINE CHLORIDE 200 MG/10ML IV SOSY
PREFILLED_SYRINGE | INTRAVENOUS | Status: DC | PRN
  Administered 2023-05-13: 100 mg via INTRAVENOUS

## 2023-05-13 MED ORDER — PROPOFOL 10 MG/ML IV BOLUS
INTRAVENOUS | Status: DC | PRN
  Administered 2023-05-13: 50 mg via INTRAVENOUS

## 2023-05-13 MED ORDER — IOHEXOL 300 MG/ML  SOLN: 100.0000 mL | Freq: Once | INTRAMUSCULAR | Status: DC | PRN

## 2023-05-13 MED ORDER — NITROGLYCERIN 1 MG/10 ML FOR IR/CATH LAB
INTRA_ARTERIAL | Status: AC | PRN
  Administered 2023-05-13 – 2023-05-14 (×9): 25 ug via INTRA_ARTERIAL

## 2023-05-13 MED ORDER — DEXTROSE 50 % IV SOLN: 1.0000 | INTRAVENOUS | Status: DC | PRN

## 2023-05-13 MED ORDER — ACETAMINOPHEN 650 MG RE SUPP: 650.0000 mg | Freq: Four times a day (QID) | RECTAL | Status: DC | PRN

## 2023-05-13 MED ORDER — LIDOCAINE 2% (20 MG/ML) 5 ML SYRINGE
INTRAMUSCULAR | Status: DC | PRN
  Administered 2023-05-13: 60 mg via INTRAVENOUS

## 2023-05-13 MED ORDER — AMIODARONE HCL IN DEXTROSE 360-4.14 MG/200ML-% IV SOLN
30.0000 mg/h | INTRAVENOUS | Status: DC
  Administered 2023-05-14 (×2): 30 mg/h via INTRAVENOUS
  Filled 2023-05-13 (×3): qty 200

## 2023-05-13 MED ORDER — DILTIAZEM HCL-DEXTROSE 125-5 MG/125ML-% IV SOLN (PREMIX)
5.0000 mg/h | INTRAVENOUS | Status: DC
  Administered 2023-05-13: 5 mg/h via INTRAVENOUS
  Administered 2023-05-14 – 2023-05-15 (×5): 15 mg/h via INTRAVENOUS
  Administered 2023-05-15: 10 mg/h via INTRAVENOUS
  Filled 2023-05-13 (×10): qty 125

## 2023-05-13 MED ORDER — LEVALBUTEROL HCL 0.63 MG/3ML IN NEBU: 0.6300 mg | INHALATION_SOLUTION | Freq: Four times a day (QID) | RESPIRATORY_TRACT | Status: DC | PRN

## 2023-05-13 MED ORDER — LACTATED RINGERS IV SOLN: INTRAVENOUS | Status: DC

## 2023-05-13 MED ORDER — ACETAMINOPHEN 325 MG PO TABS: 650.0000 mg | ORAL_TABLET | Freq: Four times a day (QID) | ORAL | Status: DC | PRN

## 2023-05-13 MED ORDER — MORPHINE SULFATE (PF) 2 MG/ML IV SOLN
1.0000 mg | INTRAVENOUS | Status: DC | PRN
Start: 2023-05-13 — End: 2023-05-14

## 2023-05-13 MED ORDER — STROKE: EARLY STAGES OF RECOVERY BOOK
Freq: Once | Status: AC
  Filled 2023-05-13: qty 1

## 2023-05-13 MED ORDER — LACTATED RINGERS IV SOLN: INTRAVENOUS | Status: DC | PRN

## 2023-05-13 MED ORDER — ROCURONIUM BROMIDE 10 MG/ML (PF) SYRINGE
PREFILLED_SYRINGE | INTRAVENOUS | Status: DC | PRN
  Administered 2023-05-13: 50 mg via INTRAVENOUS
  Administered 2023-05-13: 20 mg via INTRAVENOUS

## 2023-05-13 MED ORDER — AMIODARONE HCL IN DEXTROSE 360-4.14 MG/200ML-% IV SOLN
60.0000 mg/h | INTRAVENOUS | Status: DC
  Administered 2023-05-13 (×2): 60 mg/h via INTRAVENOUS

## 2023-05-13 MED ORDER — IOHEXOL 350 MG/ML SOLN
75.0000 mL | Freq: Once | INTRAVENOUS | Status: AC | PRN
Start: 2023-05-13 — End: 2023-05-13
  Administered 2023-05-13: 75 mL via INTRAVENOUS

## 2023-05-13 NOTE — Consult Note (Signed)
 Cardiology Consultation   Patient ID: RASHAUN CURL MRN: [id number]; DOB: 03/31/1928  Admit date: 05/13/2023 Date of Consult: 05/13/2023  PCP:  Dale Albia, MD   [facility] Providers Cardiologist:  Lorine Bears, MD  Electrophysiologist:  Lanier Prude, MD  Advanced Heart Failure:  Arvilla Meres, MD       Patient Profile:   NATIKA GEYER is a 89 y.o. female with a hx of HFpEF, nonobstructive CAD and permanent atrial flutter/fibrillation who is being seen 05/13/2023 for the evaluation of atrial flutter with rapid ventricular rates after being found down at home, at the request of Dr. Wilkie Aye.  History of Present Illness:   Ms. 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  She lives alone and is fiercely independent.  She still drives.  She missed a doctor's appointment today.  This alerted the family, which had not heard from her for over 24 hours.  They had to break the door down to get to her.  She was found down on the floor and she has hematomas on the right side of her head, right shoulder and right thigh.  She is currently awake but does not respond to questions.  She does not appear to be in acute distress.  She is not agitated.  CT of the head does not show evidence of acute infarction or intracranial hemorrhage, but she has known stable left occipital encephalomalacia.  She has no fractures of the cervical spine.  No significant traumatic injuries to the chest abdomen and pelvis (chronic wedge deformity of vertebra L2).  There is evidence of coronary artery  calcification and aortic atherosclerosis.  Pacemaker function was normal at recent remote download 04/01/2023.  She is not pacemaker dependent and only requires roughly 30% ventricular pacing.  Her heart rate histogram shows 2 peaks, at the lower rate limit of 60 bpm and a second peak around 120 bpm, consistent with atrial flutter with RVR.  An arrhythmia monitor performed in February 2024 showed similar findings with permanent atrial flutter with overall good rate control, average ventricular rate of only 80 bpm but sometimes reaching 159 bpm.  Appears she has been in persistent atrial flutter with variable AV block at least since 2021.  She has not had attempts at cardioversion since 2020.  The most recent recent coronary evaluation with cardiac catheterization was in June 2019, showing nonobstructive CAD (and severe MR which was not confirmed on subsequent evaluations with echocardiography).  Previous cath in March 2016 following non-STEMI which showed a 60% ostial left circumflex stenosis which was possibly culprit but treated medically.  She has preserved left ventricular systolic function, most recently evaluated by echocardiogram in January 2024 (EF 60 to 65%, normal regional wall motion, severe biatrial dilation, mild MR, mild-moderate TR and aortic sclerosis without stenosis).  The report of severe mitral regurgitation on one of her cardiac catheterization procedures could not be duplicated on echocardiography, even on TEE with administration of intravenous phenylephrine to boost the blood pressure.  She has history of Billroth II gastrectomy and bypass due to peptic ulcer disease and has had issues with dumping syndrome in the past.   Past Medical History:  Diagnosis  Date   (HFpEF) heart failure with preserved ejection fraction (HCC)    a. 07/2017 Echo: EF 50-55%, no rwma, mild AS/AI, mod MR, mod dil LA, mod TR, PASP ; b. 03/2021 Echo: EF 60-65%. No rwma. Nl RV fxn. Sev BAE. Mild MR.  Mild AI/AS.   Atrial fibrillation (HCC)    Autoimmune hepatitis (HCC)    followed by Dr Marva Panda   CHF (congestive heart failure) [facility])    Fibrocystic breast disease    Glaucoma    Hypercholesterolemia    Hypertension    Hypothyroidism    Inflammatory arthritis    MI (myocardial infarction) (HCC)    Mitral regurgitation    a. 07/2017 Echo: Mod MR; b. 08/2017 Cath: 4+/Severe MR; c. 08/2017 TEE: Mild MR; d. 03/2021 Echo: Mild MR.   Neuropathy    Non-obstructive Coronary artery disease    a. 05/2014 NSTEMI/Cath: LM nl, LAD mild dzs, LCX 60ost hazy (? culprit), RCA mild dzs. EF 60% by echo-->Med Rx; b. 08/2017 Cath: LM 30ost, LAD min irregs, LCX small, 40ost/p, RCA large, min irregs, RPDA/RPL1 nl, EF 50-55%. 4+MR.   Osteoarthritis    Permanent atrial fibrillation (HCC)    a. CHA2DS2VASc = 6-->Xarelto.   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    Past Surgical History:  Procedure Laterality Date   ABDOMINAL HYSTERECTOMY  1963   partial, secondary to fibroids   APPENDECTOMY     Billroth     CARDIAC CATHETERIZATION  05/12/14   [facility]   CARDIOVERSION N/A 12/26/2016   Procedure: CARDIOVERSION;  Surgeon: Iran Ouch, MD;  Location: [facility] ORS;  Service: Cardiovascular;  Laterality: N/A;   CARDIOVERSION N/A 05/16/2017   Procedure: CARDIOVERSION;  Surgeon: Iran Ouch, MD;  Location: [facility] ORS;  Service: Cardiovascular;  Laterality: N/A;   CARDIOVERSION N/A 06/09/2017   Procedure: CARDIOVERSION;  Surgeon: Iran Ouch, MD;  Location: [facility] ORS;  Service: Cardiovascular;  Laterality: N/A;   CARDIOVERSION N/A 11/30/2018   Procedure: CARDIOVERSION;  Surgeon: Iran Ouch, MD;  Location: [facility] ORS;  Service: Cardiovascular;  Laterality: N/A;   CHOLECYSTECTOMY  1998   COLONOSCOPY  2009   PACEMAKER IMPLANT N/A 09/28/2021   Procedure: PACEMAKER IMPLANT;  Surgeon: Lanier Prude, MD;  Location: [facility] INVASIVE CV LAB;  Service:  Cardiovascular;  Laterality: N/A;   RIGHT/LEFT HEART CATH AND CORONARY ANGIOGRAPHY Bilateral 08/21/2017   Procedure: RIGHT/LEFT HEART CATH AND CORONARY ANGIOGRAPHY;  Surgeon: Iran Ouch, MD;  Location: [facility] INVASIVE CV LAB;  Service: Cardiovascular;  Laterality: Bilateral;   TEE WITHOUT CARDIOVERSION N/A 08/25/2017   Procedure: TRANSESOPHAGEAL ECHOCARDIOGRAM (TEE);  Surgeon: Dolores Patty, MD;  Location: [facility] END[facility]COPY;  Service: Cardiovascular;  Laterality: N/A;   UPPER GI ENDOSCOPY  2004       Inpatient Medications: Scheduled Meds:  amiodarone  150 mg Intravenous Once   metoprolol tartrate  5 mg Intravenous Q6H   Continuous Infusions:  amiodarone     Followed by   Melene Muller ON 05/14/2023] amiodarone     cefTRIAXone (ROCEPHIN)  IV     diltiazem (CARDIZEM) infusion 15 mg/hr (05/13/23 1829)   PRN Meds:   Allergies:    Allergies  Allergen Reactions   Statins Other (See Comments)    Liver damage    Hydroxychloroquine Hives   Haldol [Haloperidol Lactate] Rash   Librax [Chlordiazepoxide-Clidinium] Rash   Plavix [Clopidogrel Bisulfate] Rash   Ramipril Rash    Social History:   Social  History   Socioeconomic History   Marital status: Widowed    Spouse name: Not on file   Number of children: 2   Years of education: Not on file   Highest education level: Not on file  Occupational History   Occupation: retired  Tobacco Use   Smoking status: Never   Smokeless tobacco: Never   Tobacco comments:    Never smoke 01/23/22  Vaping Use   Vaping status: Never Used  Substance and Sexual Activity   Alcohol use: No    Alcohol/week: 0.0 standard drinks of alcohol   Drug use: No   Sexual activity: Never  Other Topics Concern   Not on file  Social History Narrative   Not on file   Social Drivers of Health   Financial Resource Strain: Low Risk  (06/17/2022)   Overall Financial Resource Strain (CARDIA)    Difficulty of Paying Living Expenses: Not hard at all  Food  Insecurity: No Food Insecurity (06/17/2022)   Hunger Vital Sign    Worried About Running Out of Food in the Last Year: Never true    Ran Out of Food in the Last Year: Never true  Transportation Needs: No Transportation Needs (06/17/2022)   PRAPARE - Administrator, Civil Service (Medical): No    Lack of Transportation (Non-Medical): No  Physical Activity: Insufficiently Active (06/17/2022)   Exercise Vital Sign    Days of Exercise per Week: 3 days    Minutes of Exercise per Session: 40 min  Stress: No Stress Concern Present (06/17/2022)   [employer] of Occupational Health - Occupational Stress Questionnaire    Feeling of Stress : Not at all  Social Connections: Unknown (06/17/2022)   Social Connection and Isolation Panel [NHANES]    Frequency of Communication with Friends and Family: More than three times a week    Frequency of Social Gatherings with Friends and Family: More than three times a week    Attends Religious Services: Not on file    Active Member of Clubs or Organizations: Not on file    Attends Banker Meetings: Not on file    Marital Status: Not on file  Intimate Partner Violence: Not At Risk (06/17/2022)   Humiliation, Afraid, Rape, and Kick questionnaire    Fear of Current or Ex-Partner: No    Emotionally Abused: No    Physically Abused: No    Sexually Abused: No    Family History:    Family History  Problem Relation Age of Onset   Stroke Mother    Esophageal cancer Brother        also had lung cancer   Ovarian cancer Daughter    Colon cancer Daughter    Breast cancer Neg Hx      ROS:  Please see the history of present illness.  Able to obtain review of systems from the patient All other ROS reviewed and negative.     Physical Exam/Data:   Vitals:   05/13/23 1530 05/13/23 1615 05/13/23 1715 05/13/23 1924  BP: 120/85 132/79 (!) 157/94 (!) 144/78  Pulse: (!) 147 (!) 140 (!) 143 (!) 146  Resp: (!) 23 18 16 15   SpO2: 100% 100% 100%  100%  Weight:      Height:        Intake/Output Summary (Last 24 hours) at 05/13/2023 1952 Last data filed at 05/13/2023 1829 Gross per 24 hour  Intake 1024.88 ml  Output --  Net 1024.88 ml  05/13/2023    3:22 PM 04/10/2023    2:47 PM 03/26/2023    1:35 PM  Last 3 Weights  Weight (lbs) 140 lb 139 lb 3.2 oz 139 lb 14.4 oz  Weight (kg) 63.504 kg 63.141 kg 63.458 kg     Body mass index is 24.8 kg/m.  General:  Well nourished, well developed, in no acute distress.   HEENT: Ecchymosis right parietal frontal area Neck: no JVD Vascular: No carotid bruits; Distal pulses 2+ bilaterally Cardiac:  normal S1, S2; RRR, very tachycardic; very faint systolic murmur is sometimes heard, no diastolic murmur.  Healthy his left subclavian pacemaker site Lungs:  clear to auscultation bilaterally, no wheezing, rhonchi or rales  Abd: soft, nontender, no hepatomegaly  Ext: no edema; ecchymosis right shoulder and right thigh Musculoskeletal:  No deformities, BUE and BLE strength normal and equal Skin: warm and dry  Neuro:  Eyes open but does not respond to questions or follow commands.  Does have some purposeful movements. Psych:  Normal affect   EKG:  The EKG was personally reviewed and demonstrates: Atrial flutter with 2: 1 AV block Telemetry:  Telemetry was personally reviewed and demonstrates: Atrial flutter with 2: 1 AV block  Relevant CV Studies: Viewed recent pacemaker download from January 2025, cardiac catheterization and TEE from 2019 and the most recent transthoracic echocardiogram from 04/04/2022 and arrhythmia monitor from February 2024  Laboratory Data:  High Sensitivity Troponin:   Recent Labs  Lab 05/13/23 1518  TROPONINIHS 125*     Chemistry Recent Labs  Lab 05/13/23 1518 05/13/23 1526 05/13/23 1857  NA 141 143 144  K 4.1 4.1 3.8  CL 109 113*  --   CO2 17*  --   --   GLUCOSE 138* 135*  --   BUN 29* 31*  --   CREATININE 1.24* 1.10*  --   CALCIUM 9.5  --   --   MG  2.0  --   --   GFRNONAA 41*  --   --   ANIONGAP 15  --   --     Recent Labs  Lab 05/13/23 1518  PROT 6.8  ALBUMIN 3.8  AST 74*  ALT 50*  ALKPHOS 98  BILITOT 1.3*   Lipids No results for input(s): "CHOL", "TRIG", "HDL", "LABVLDL", "LDLCALC", "CHOLHDL" in the last 168 hours.  Hematology Recent Labs  Lab 05/13/23 1518 05/13/23 1526 05/13/23 1857  WBC 14.0*  --   --   RBC 5.02  --   --   HGB 15.6* 16.7* 15.3*  HCT 49.2* 49.0* 45.0  MCV 98.0  --   --   MCH 31.1  --   --   MCHC 31.7  --   --   RDW 18.2*  --   --   PLT 206  --   --    Thyroid No results for input(s): "TSH", "FREET4" in the last 168 hours.  BNP Recent Labs  Lab 05/13/23 1518  BNP 673.8*    DDimer No results for input(s): "DDIMER" in the last 168 hours.   Radiology/Studies:  DG Shoulder Right Portable Result Date: 05/13/2023 CLINICAL DATA:  Status post trauma. EXAM: RIGHT SHOULDER - 1 VIEW COMPARISON:  None Available. FINDINGS: Limited study secondary to numerous overlying radiopaque cardiac lead wires. There is no evidence of acute fracture or dislocation. Chronic changes are seen along the greater tubercle and inferior medial aspect of the right humeral. There are moderate severity degenerative changes. Soft tissues are unremarkable. IMPRESSION: 1.  Limited study without evidence of acute fracture or dislocation. 2. Moderate severity chronic and degenerative changes. Electronically Signed   By: Aram Candela M.D.   On: 05/13/2023 17:46   DG Pelvis Portable Result Date: 05/13/2023 CLINICAL DATA:  Status post trauma. EXAM: PORTABLE PELVIS 1-2 VIEWS COMPARISON:  March 05, 2016 FINDINGS: There is no evidence of pelvic fracture or diastasis. No pelvic bone lesions are seen. IMPRESSION: Negative. Electronically Signed   By: Aram Candela M.D.   On: 05/13/2023 17:45   DG Chest Port 1 View Result Date: 05/13/2023 CLINICAL DATA:  Status post trauma. EXAM: PORTABLE CHEST 1 VIEW COMPARISON:  December 17, 2022  FINDINGS: There is stable dual lead AICD positioning. The heart size and mediastinal contours are within normal limits. Marked severity calcification of the thoracic aorta is noted. There is no evidence of an acute infiltrate, pleural effusion or pneumothorax. Radiopaque surgical clips are seen overlying the esophageal hiatus. There is mild dextroscoliosis of the mid and lower thoracic spine with multilevel degenerative changes. IMPRESSION: No active cardiopulmonary disease. Electronically Signed   By: Aram Candela M.D.   On: 05/13/2023 17:44   CT CHEST ABDOMEN PELVIS W CONTRAST Result Date: 05/13/2023 CLINICAL DATA:  Found unresponsive, unknown down time EXAM: CT CHEST, ABDOMEN, AND PELVIS WITH CONTRAST CT THORACIC AND LUMBAR SPINE WITH CONTRAST TECHNIQUE: Multidetector CT imaging of the chest, abdomen and pelvis was performed following the standard protocol during bolus administration of intravenous contrast. Multidetector CT imaging of the thoracic and lumbar spine was performed following the standard protocol during bolus administration of intravenous contrast. RADIATION DOSE REDUCTION: This exam was performed according to the departmental dose-optimization program which includes automated exposure control, adjustment of the mA and/or kV according to patient size and/or use of iterative reconstruction technique. CONTRAST:  75mL OMNIPAQUE IOHEXOL 350 MG/ML SOLN COMPARISON:  CT abdomen pelvis, 04/22/2014 FINDINGS: CT CHEST FINDINGS Cardiovascular: Left chest multi lead pacer. Aortic atherosclerosis. Normal heart size. Scattered left and right coronary artery calcifications. No pericardial effusion. Mediastinum/Nodes: No enlarged mediastinal, hilar, or axillary lymph nodes. Thyroid gland, trachea, and esophagus demonstrate no significant findings. Lungs/Pleura: Lungs are clear. No pleural effusion or pneumothorax. Musculoskeletal: No chest wall mass or suspicious osseous lesions identified. CT ABDOMEN PELVIS  FINDINGS Hepatobiliary: No focal liver abnormality is seen. Status post cholecystectomy. Similar severe postoperative biliary ductal dilatation. Pancreas: Unremarkable. No pancreatic ductal dilatation or surrounding inflammatory changes. Spleen: Normal in size without significant abnormality. Adrenals/Urinary Tract: Adrenal glands are unremarkable. Kidneys are normal, without renal calculi, solid lesion, or hydronephrosis. Bladder is unremarkable. Stomach/Bowel: Stomach is within normal limits. Appendix not clearly visualized. No evidence of bowel wall thickening, distention, or inflammatory changes. Vascular/Lymphatic: Aortic atherosclerosis. 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 No enlarged abdominal or pelvic lymph nodes. Reproductive: No mass or other abnormality. Other: No abdominal wall hernia or abnormality. No ascites. Musculoskeletal: No acute osseous findings. CT THORACIC AND LUMBAR SPINE FINDINGS Alignment: Normal thoracic kyphosis. Normal lumbar lordosis. Vertebral bodies: No acute fracture or dislocation. Unchanged mild superior endplate wedge deformity of L2, with approximately 20% height loss. Disc spaces: Generally mild multilevel disc space height loss and osteophytosis throughout the thoracic and lumbar spine, focally moderate at L3-L4. Paraspinous soft tissues: Unremarkable. IMPRESSION: 1. No CT evidence of acute traumatic injury to the chest, abdomen, or pelvis. 2. No acute fracture or dislocation of the thoracic or lumbar spine. Unchanged mild, chronic wedge deformity of L2. 3. Status  post cholecystectomy. Similar severe postoperative biliary ductal dilatation. 4. Coronary artery disease. Aortic Atherosclerosis (ICD10-I70.0). Electronically Signed   By: Jearld Lesch M.D.   On: 05/13/2023 17:28   CT L-SPINE NO CHARGE Result Date: 05/13/2023 CLINICAL DATA:  Found unresponsive, unknown down time EXAM: CT CHEST, ABDOMEN, AND  PELVIS WITH CONTRAST CT THORACIC AND LUMBAR SPINE WITH CONTRAST TECHNIQUE: Multidetector CT imaging of the chest, abdomen and pelvis was performed following the standard protocol during bolus administration of intravenous contrast. Multidetector CT imaging of the thoracic and lumbar spine was performed following the standard protocol during bolus administration of intravenous contrast. RADIATION DOSE REDUCTION: This exam was performed according to the departmental dose-optimization program which includes automated exposure control, adjustment of the mA and/or kV according to patient size and/or use of iterative reconstruction technique. CONTRAST:  75mL OMNIPAQUE IOHEXOL 350 MG/ML SOLN COMPARISON:  CT abdomen pelvis, 04/22/2014 FINDINGS: CT CHEST FINDINGS Cardiovascular: Left chest multi lead pacer. Aortic atherosclerosis. Normal heart size. Scattered left and right coronary artery calcifications. No pericardial effusion. Mediastinum/Nodes: No enlarged mediastinal, hilar, or axillary lymph nodes. Thyroid gland, trachea, and esophagus demonstrate no significant findings. Lungs/Pleura: Lungs are clear. No pleural effusion or pneumothorax. Musculoskeletal: No chest wall mass or suspicious osseous lesions identified. CT ABDOMEN PELVIS FINDINGS Hepatobiliary: No focal liver abnormality is seen. Status post cholecystectomy. Similar severe postoperative biliary ductal dilatation. Pancreas: Unremarkable. No pancreatic ductal dilatation or surrounding inflammatory changes. Spleen: Normal in size without significant abnormality. Adrenals/Urinary Tract: Adrenal glands are unremarkable. Kidneys are normal, without renal calculi, solid lesion, or hydronephrosis. Bladder is unremarkable. Stomach/Bowel: Stomach is within normal limits. Appendix not clearly visualized. No evidence of bowel wall thickening, distention, or inflammatory changes. Vascular/Lymphatic: Aortic atherosclerosis. Unchanged, densely calcified aneurysm of a  distal splenic artery branch measuring 0.9 cm (series 3, image 64). No enlarged abdominal or pelvic lymph nodes. Reproductive: No mass or other abnormality. Other: No abdominal wall hernia or abnormality. No ascites. Musculoskeletal: No acute osseous findings. CT THORACIC AND LUMBAR SPINE FINDINGS Alignment: Normal thoracic kyphosis. Normal lumbar lordosis. Vertebral bodies: No acute fracture or dislocation. Unchanged mild superior endplate wedge deformity of L2, with approximately 20% height loss. Disc spaces: Generally mild multilevel disc space height loss and osteophytosis throughout the thoracic and lumbar spine, focally moderate at L3-L4. Paraspinous soft tissues: Unremarkable. IMPRESSION: 1. No CT evidence of acute traumatic injury to the chest, abdomen, or pelvis. 2. No acute fracture or dislocation of the thoracic or lumbar spine. Unchanged mild, chronic wedge deformity of L2. 3. Status post cholecystectomy. Similar severe postoperative biliary ductal dilatation. 4. Coronary artery disease. Aortic Atherosclerosis (ICD10-I70.0). Electronically Signed   By: Jearld Lesch M.D.   On: 05/13/2023 17:28   CT T-SPINE NO CHARGE Result Date: 05/13/2023 CLINICAL DATA:  Found unresponsive, unknown down time EXAM: CT CHEST, ABDOMEN, AND PELVIS WITH CONTRAST CT THORACIC AND LUMBAR SPINE WITH CONTRAST TECHNIQUE: Multidetector CT imaging of the chest, abdomen and pelvis was performed following the standard protocol during bolus administration of intravenous contrast. Multidetector CT imaging of the thoracic and lumbar spine was performed following the standard protocol during bolus administration of intravenous contrast. RADIATION DOSE REDUCTION: This exam was performed according to the departmental dose-optimization program which includes automated exposure control, adjustment of the mA and/or kV according to patient size and/or use of iterative reconstruction technique. CONTRAST:  75mL OMNIPAQUE IOHEXOL 350 MG/ML SOLN  COMPARISON:  CT abdomen pelvis, 04/22/2014 FINDINGS: CT CHEST FINDINGS Cardiovascular: Left chest multi lead pacer. Aortic atherosclerosis. Normal heart  size. Scattered left and right coronary artery calcifications. No pericardial effusion. Mediastinum/Nodes: No enlarged mediastinal, hilar, or axillary lymph nodes. Thyroid gland, trachea, and esophagus demonstrate no significant findings. Lungs/Pleura: Lungs are clear. No pleural effusion or pneumothorax. Musculoskeletal: No chest wall mass or suspicious osseous lesions identified. CT ABDOMEN PELVIS FINDINGS Hepatobiliary: No focal liver abnormality is seen. Status post cholecystectomy. Similar severe postoperative biliary ductal dilatation. Pancreas: Unremarkable. No pancreatic ductal dilatation or surrounding inflammatory changes. Spleen: Normal in size without significant abnormality. Adrenals/Urinary Tract: Adrenal glands are unremarkable. Kidneys are normal, without renal calculi, solid lesion, or hydronephrosis. Bladder is unremarkable. Stomach/Bowel: Stomach is within normal limits. Appendix not clearly visualized. No evidence of bowel wall thickening, distention, or inflammatory changes. Vascular/Lymphatic: Aortic atherosclerosis. 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 No enlarged abdominal or pelvic lymph nodes. Reproductive: No mass or other abnormality. Other: No abdominal wall hernia or abnormality. No ascites. Musculoskeletal: No acute osseous findings. CT THORACIC AND LUMBAR SPINE FINDINGS Alignment: Normal thoracic kyphosis. Normal lumbar lordosis. Vertebral bodies: No acute fracture or dislocation. Unchanged mild superior endplate wedge deformity of L2, with approximately 20% height loss. Disc spaces: Generally mild multilevel disc space height loss and osteophytosis throughout the thoracic and lumbar spine, focally moderate at L3-L4. Paraspinous soft tissues: Unremarkable.  IMPRESSION: 1. No CT evidence of acute traumatic injury to the chest, abdomen, or pelvis. 2. No acute fracture or dislocation of the thoracic or lumbar spine. Unchanged mild, chronic wedge deformity of L2. 3. Status post cholecystectomy. Similar severe postoperative biliary ductal dilatation. 4. Coronary artery disease. Aortic Atherosclerosis (ICD10-I70.0). Electronically Signed   By: Jearld Lesch M.D.   On: 05/13/2023 17:28   CT HEAD WO CONTRAST Result Date: 05/13/2023 CLINICAL DATA:  Found unresponsive, unknown downtime EXAM: CT HEAD WITHOUT CONTRAST CT MAXILLOFACIAL WITHOUT CONTRAST CT CERVICAL SPINE WITHOUT CONTRAST TECHNIQUE: Multidetector CT imaging of the head, cervical spine, and maxillofacial structures were performed using the standard protocol without intravenous contrast. Multiplanar CT image reconstructions of the cervical spine and maxillofacial structures were also generated. RADIATION DOSE REDUCTION: This exam was performed according to the departmental dose-optimization program which includes automated exposure control, adjustment of the mA and/or kV according to patient size and/or use of iterative reconstruction technique. COMPARISON:  04/10/2023 FINDINGS: CT HEAD FINDINGS Brain: No evidence of acute infarction, hemorrhage, hydrocephalus, extra-axial collection or mass lesion/mass effect. Mild periventricular and deep white matter hypodensity. Unchanged left occipital encephalomalacia. Vascular: No hyperdense vessel or unexpected calcification. CT FACIAL BONES FINDINGS Skull: Normal. Negative for fracture or focal lesion. Facial bones: No displaced fractures or dislocations. Sinuses/Orbits: Small air-fluid level in the right maxillary sinus. No overlying fracture. Other: Patient is partially edentulous with erosion of the left maxillary alveolar bone into the left maxillary sinus (series 7, image 31). CT CERVICAL SPINE FINDINGS Alignment: Normal. Skull base and vertebrae: No acute fracture. No  primary bone lesion or focal pathologic process. Soft tissues and spinal canal: No prevertebral fluid or swelling. No visible canal hematoma. Disc levels: Focally moderate disc space height loss and osteophytosis of the lower cervical levels C5-C7 Upper chest: Negative. Other: None. IMPRESSION: 1. No acute intracranial pathology. Small-vessel white matter disease and unchanged left occipital encephalomalacia. 2. No displaced fractures or dislocations of the facial bones. 3. Small air-fluid level in the right maxillary sinus. No overlying fracture. Patient is partially edentulous with erosion of the left maxillary alveolar bone into the left maxillary sinus. Correlate for sinusitis.  4. No fracture or static subluxation of the cervical spine. 5. Focally moderate disc space height loss and osteophytosis of the lower cervical levels. Electronically Signed   By: Jearld Lesch M.D.   On: 05/13/2023 17:20   CT CERVICAL SPINE WO CONTRAST Result Date: 05/13/2023 CLINICAL DATA:  Found unresponsive, unknown downtime EXAM: CT HEAD WITHOUT CONTRAST CT MAXILLOFACIAL WITHOUT CONTRAST CT CERVICAL SPINE WITHOUT CONTRAST TECHNIQUE: Multidetector CT imaging of the head, cervical spine, and maxillofacial structures were performed using the standard protocol without intravenous contrast. Multiplanar CT image reconstructions of the cervical spine and maxillofacial structures were also generated. RADIATION DOSE REDUCTION: This exam was performed according to the departmental dose-optimization program which includes automated exposure control, adjustment of the mA and/or kV according to patient size and/or use of iterative reconstruction technique. COMPARISON:  04/10/2023 FINDINGS: CT HEAD FINDINGS Brain: No evidence of acute infarction, hemorrhage, hydrocephalus, extra-axial collection or mass lesion/mass effect. Mild periventricular and deep white matter hypodensity. Unchanged left occipital encephalomalacia. Vascular: No hyperdense  vessel or unexpected calcification. CT FACIAL BONES FINDINGS Skull: Normal. Negative for fracture or focal lesion. Facial bones: No displaced fractures or dislocations. Sinuses/Orbits: Small air-fluid level in the right maxillary sinus. No overlying fracture. Other: Patient is partially edentulous with erosion of the left maxillary alveolar bone into the left maxillary sinus (series 7, image 31). CT CERVICAL SPINE FINDINGS Alignment: Normal. Skull base and vertebrae: No acute fracture. No primary bone lesion or focal pathologic process. Soft tissues and spinal canal: No prevertebral fluid or swelling. No visible canal hematoma. Disc levels: Focally moderate disc space height loss and osteophytosis of the lower cervical levels C5-C7 Upper chest: Negative. Other: None. IMPRESSION: 1. No acute intracranial pathology. Small-vessel white matter disease and unchanged left occipital encephalomalacia. 2. No displaced fractures or dislocations of the facial bones. 3. Small air-fluid level in the right maxillary sinus. No overlying fracture. Patient is partially edentulous with erosion of the left maxillary alveolar bone into the left maxillary sinus. Correlate for sinusitis. 4. No fracture or static subluxation of the cervical spine. 5. Focally moderate disc space height loss and osteophytosis of the lower cervical levels. Electronically Signed   By: Jearld Lesch M.D.   On: 05/13/2023 17:20   CT MAXILLOFACIAL WO CONTRAST Result Date: 05/13/2023 CLINICAL DATA:  Found unresponsive, unknown downtime EXAM: CT HEAD WITHOUT CONTRAST CT MAXILLOFACIAL WITHOUT CONTRAST CT CERVICAL SPINE WITHOUT CONTRAST TECHNIQUE: Multidetector CT imaging of the head, cervical spine, and maxillofacial structures were performed using the standard protocol without intravenous contrast. Multiplanar CT image reconstructions of the cervical spine and maxillofacial structures were also generated. RADIATION DOSE REDUCTION: This exam was performed  according to the departmental dose-optimization program which includes automated exposure control, adjustment of the mA and/or kV according to patient size and/or use of iterative reconstruction technique. COMPARISON:  04/10/2023 FINDINGS: CT HEAD FINDINGS Brain: No evidence of acute infarction, hemorrhage, hydrocephalus, extra-axial collection or mass lesion/mass effect. Mild periventricular and deep white matter hypodensity. Unchanged left occipital encephalomalacia. Vascular: No hyperdense vessel or unexpected calcification. CT FACIAL BONES FINDINGS Skull: Normal. Negative for fracture or focal lesion. Facial bones: No displaced fractures or dislocations. Sinuses/Orbits: Small air-fluid level in the right maxillary sinus. No overlying fracture. Other: Patient is partially edentulous with erosion of the left maxillary alveolar bone into the left maxillary sinus (series 7, image 31). CT CERVICAL SPINE FINDINGS Alignment: Normal. Skull base and vertebrae: No acute fracture. No primary bone lesion or focal pathologic process. Soft tissues and spinal canal:  No prevertebral fluid or swelling. No visible canal hematoma. Disc levels: Focally moderate disc space height loss and osteophytosis of the lower cervical levels C5-C7 Upper chest: Negative. Other: None. IMPRESSION: 1. No acute intracranial pathology. Small-vessel white matter disease and unchanged left occipital encephalomalacia. 2. No displaced fractures or dislocations of the facial bones. 3. Small air-fluid level in the right maxillary sinus. No overlying fracture. Patient is partially edentulous with erosion of the left maxillary alveolar bone into the left maxillary sinus. Correlate for sinusitis. 4. No fracture or static subluxation of the cervical spine. 5. Focally moderate disc space height loss and osteophytosis of the lower cervical levels. Electronically Signed   By: Jearld Lesch M.D.   On: 05/13/2023 17:20     Assessment and Plan:   Fall: She  was found on the ground with superficial injuries to her head shoulder and thigh but thankfully without intracranial bleeding or serious trauma.  It is unclear whether she had syncope or whether this was a mechanical fall.  It is possible that she was down on the floor for over 24 hours.  Will interrogate her device to see if there is any evidence of ventricular arrhythmia, but this has not been described in her previous history. Atrial flutter: This is a permanent arrhythmia for at least the last 5 years.  Frequently has episodes of mild RVR.  Currently has 2: 1 AV block with a ventricular rate of about 150 bpm.  It is likely that she has not received her diltiazem and metoprolol for at least 24, possibly 48 hours.  Is probably a component of beta-blocker rebound.  She is currently on maximum dose diltiazem drip without much impact on the ventricular rate.  Will start intravenous beta-blockers as well since we are unable to give her p.o. medication.  I suspect this will be insufficient for rate control in the current circumstances and have also written orders for amiodarone IV.  Her embolic risk is very high and if we are unable to give her oral anticoagulants recommend intravenous heparin, if this is deemed safe by the trauma team HFPEF: No evidence of acute exacerbation.  She is probably actually dry.  BNP in the 600-700 range is similar to previous value from September 2024, although she has had lower BNP's of just under 400 in the past. CAD: Has had previous small non-STEMI but has not had any major injury and had only nonobstructive CAD at 2 previous cardiac catheterizations.  Minimally elevated troponin at 125 is not surprising considering what she has been through.  Evidence of true acute coronary syndrome.  CK is elevated consistent with skeletal muscle trauma. MR: Most evaluations have shown this to be only mild or mild-moderate.  May have had functional severe MR in 2019. CKD3b: Surprisingly her  creatinine is at baseline, GFR approximately 35-40.   Risk Assessment/Risk Scores:        New York Heart Association (NYHA) Functional Class cannot be assessed at this time   CHA2DS2-VASc Score = 8   This indicates a 10.8% annual risk of stroke. The patient's score is based upon: CHF History: 1 HTN History: 1 Diabetes History: 0 Stroke History: 2 Vascular Disease History: 1 Age Score: 2 Gender Score: 1         For questions or updates, please contact [facility] Please consult [email or web address] for contact info under    Signed, Thurmon Fair, MD  05/13/2023 7:52 PM

## 2023-05-13 NOTE — ED Notes (Addendum)
 Pt linen and gown changed. Pt soiled.

## 2023-05-13 NOTE — Progress Notes (Signed)
 Orthopedic Tech Progress Note Patient Details:  Ruth Roth Apr 18, 1928 [id number]  Level 2 trauma   [name], female   DOB: March 17, 1928, 89 y.o.   MRN: [id number]  Ruth Roth 05/13/2023, 4:35 PM

## 2023-05-13 NOTE — ED Notes (Signed)
 Trauma Response Nurse Documentation  Ruth Roth is a 89 y.o. female arriving to [facility] ED via EMS  On Xarelto (rivaroxaban) daily. Trauma was activated as a Level 2 based on the following trauma criteria GCS 10-14 associated with trauma or AVPU < A.  Patient cleared for CT by Dr. Wilkie Aye. Pt transported to CT with trauma response nurse present to monitor. RN remained with the patient throughout their absence from the department for clinical observation. GCS 10.  History   Past Medical History:  Diagnosis Date   (HFpEF) heart failure with preserved ejection fraction (HCC)    a. 07/2017 Echo: EF 50-55%, no rwma, mild AS/AI, mod MR, mod dil LA, mod TR, PASP ; b. 03/2021 Echo: EF 60-65%. No rwma. Nl RV fxn. Sev BAE. Mild MR. Mild AI/AS.   Atrial fibrillation (HCC)    Autoimmune hepatitis (HCC)    followed by Dr Marva Panda   CHF (congestive heart failure) [facility])    Fibrocystic breast disease    Glaucoma    Hypercholesterolemia    Hypertension    Hypothyroidism    Inflammatory arthritis    MI (myocardial infarction) (HCC)    Mitral regurgitation    a. 07/2017 Echo: Mod MR; b. 08/2017 Cath: 4+/Severe MR; c. 08/2017 TEE: Mild MR; d. 03/2021 Echo: Mild MR.   Neuropathy    Non-obstructive Coronary artery disease    a. 05/2014 NSTEMI/Cath: LM nl, LAD mild dzs, LCX 60ost hazy (? culprit), RCA mild dzs. EF 60% by echo-->Med Rx; b. 08/2017 Cath: LM 30ost, LAD min irregs, LCX small, 40ost/p, RCA large, min irregs, RPDA/RPL1 nl, EF 50-55%. 4+MR.   Osteoarthritis    Permanent atrial fibrillation (HCC)    a. CHA2DS2VASc = 6-->Xarelto.   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     Past Surgical History:  Procedure Laterality Date   ABDOMINAL HYSTERECTOMY  1963   partial, secondary to fibroids   APPENDECTOMY     Billroth     CARDIAC CATHETERIZATION  05/12/14   [facility]   CARDIOVERSION N/A 12/26/2016   Procedure: CARDIOVERSION;  Surgeon: Iran Ouch, MD;  Location: [facility] ORS;  Service: Cardiovascular;  Laterality: N/A;   CARDIOVERSION N/A 05/16/2017   Procedure: CARDIOVERSION;  Surgeon: Iran Ouch, MD;  Location: [facility] ORS;  Service: Cardiovascular;  Laterality: N/A;   CARDIOVERSION N/A 06/09/2017   Procedure: CARDIOVERSION;  Surgeon: Iran Ouch, MD;  Location: [facility] ORS;  Service: Cardiovascular;  Laterality: N/A;   CARDIOVERSION N/A 11/30/2018   Procedure: CARDIOVERSION;  Surgeon: Iran Ouch, MD;  Location: [facility] ORS;  Service: Cardiovascular;  Laterality: N/A;   CHOLECYSTECTOMY  1998   COLONOSCOPY  2009   PACEMAKER IMPLANT N/A 09/28/2021   Procedure: PACEMAKER IMPLANT;  Surgeon: Lanier Prude, MD;  Location: [facility] INVASIVE CV LAB;  Service: Cardiovascular;  Laterality: N/A;   RIGHT/LEFT HEART CATH AND CORONARY ANGIOGRAPHY Bilateral 08/21/2017   Procedure: RIGHT/LEFT HEART CATH AND CORONARY ANGIOGRAPHY;  Surgeon: Iran Ouch, MD;  Location: [facility] INVASIVE CV LAB;  Service: Cardiovascular;  Laterality: Bilateral;   TEE WITHOUT CARDIOVERSION N/A 08/25/2017   Procedure: TRANSESOPHAGEAL ECHOCARDIOGRAM (TEE);  Surgeon: Dolores Patty, MD;  Location: [facility] ENDOSCOPY;  Service: Cardiovascular;  Laterality: N/A;   UPPER GI ENDOSCOPY  2004     Initial Focused Assessment (If applicable, or please see trauma documentation): Unknown orientation, does respond to name and seems to recognize family Airway currently patent, no tongue obstruction  Bilateral breath sounds Pulses 1+  CT's Completed:   CT Head, CT C-Spine, CT Chest w/ contrast, and CT abdomen/pelvis w/ contrast   Interventions:  IV, labs CT Head/Cspine/C/A/P Multiple areas of MASD due to urine and feces for unknown period of time  Plan for disposition:  Admission to [facility]   Event Summary: Patient to ED after being found down, unknown downtime (could be 2 days). Patient initially unresponsive, soiled. Patient became more responsive  responding to pain and moving all extemities. Does seem that she is weaker on the right side and is favoring her left. Imaging was ordered and revealed no traumatic injury. Plans for medical admission for dehydration, rhabdo, afib/flutter. Plan for MRI tomorrow due to pacemaker.  Bedside handoff with ED RN Lexi.    Jill Side Francina Beery  Trauma Response RN  Please call TRN at [phone] for further assistance.

## 2023-05-13 NOTE — ED Triage Notes (Signed)
 Pt BIB [facility] from home. Pt found unresponsive by grandson last spoken to two days ago. Pt missed appointment yesterday, normally alert and oriented x4, lives at home and can take care of herself. Pt found 82% on room air. Afib rvr 160s given a bould of 16 mg Cardizem with EMS. On cardizem at home.   Cbg 132 160 bp  R 18 AC

## 2023-05-13 NOTE — ED Notes (Signed)
 Hematoma to the back right of head.

## 2023-05-13 NOTE — ED Notes (Signed)
 [employer] pacemaker interrogation performed at this time. Awaiting results from [employer]

## 2023-05-13 NOTE — Anesthesia Preprocedure Evaluation (Signed)
 Anesthesia Evaluation  Patient identified by MRN, date of birth, ID band Patient confused    Reviewed: Allergy & Precautions, H&P , NPO status , Patient's Chart, lab work & pertinent test results  Airway Mallampati: III  TM Distance: <3 FB Neck ROM: Full    Dental no notable dental hx. (+) Teeth Intact, Dental Advisory Given   Pulmonary neg pulmonary ROS   Pulmonary exam normal breath sounds clear to auscultation       Cardiovascular hypertension, Pt. on medications and Pt. on home beta blockers + CAD, + Past MI and +CHF  + dysrhythmias Atrial Fibrillation  Rhythm:Irregular Rate:Tachycardia     Neuro/Psych  Headaches CVA, Residual Symptoms  negative psych ROS   GI/Hepatic Neg liver ROS, PUD,GERD  ,,  Endo/Other  Hypothyroidism    Renal/GU Renal disease  negative genitourinary   Musculoskeletal  (+) Arthritis , Osteoarthritis,    Abdominal   Peds  Hematology  (+) Blood dyscrasia, anemia   Anesthesia Other Findings   Reproductive/Obstetrics negative OB ROS                             Anesthesia Physical Anesthesia Plan  ASA: 3 and emergent  Anesthesia Plan: General   Post-op Pain Management:    Induction: Intravenous, Rapid sequence and Cricoid pressure planned  PONV Risk Score and Plan: 4 or greater and Ondansetron, Dexamethasone and Treatment may vary due to age or medical condition  Airway Management Planned: Oral ETT  Additional Equipment:   Intra-op Plan:   Post-operative Plan: Possible Post-op intubation/ventilation  Informed Consent: I have reviewed the patients History and Physical, chart, labs and discussed the procedure including the risks, benefits and alternatives for the proposed anesthesia with the patient or authorized representative who has indicated his/her understanding and acceptance.     Dental advisory given  Plan Discussed with: CRNA  Anesthesia Plan  Comments:        Anesthesia Quick Evaluation

## 2023-05-13 NOTE — H&P (Incomplete)
 NEUROLOGY ADMISSION HISTORY AND PHYSICAL   Date of service: May 13, 2023 Patient Name: Ruth Roth MRN:  [id number] DOB:  [birth year] Chief Complaint: Aphasia and right sided weakness Requesting Provider: Rozelle Logan, DO  History of Present Illness  Ruth Roth is a 89 y.o. female with a PMHx of HFpEF, pacemaker, atrial fibrillation (on Xarelto), Autoimmune hepatitis, Fibrocystic breast disease, Glaucoma, Hypercholesterolemia, Hypertension, Hypothyroidism, Inflammatory arthritis, CAD, Mitral regurgitation, Neuropathy, Osteoarthritis and PUD who presented to the ED from home this afternoon after being found down at home, awake but verbally unresponsive and with right sided weakness. LKN was on Sunday when family last interacted with her. Family member who found her noted her to be covered in fresh/wet feces, which suggests that the time of symptom onset may have been relatively recent, possibly within the past 24 hours or less.   LKW: Sunday Modified rankin score: 0-Completely asymptomatic and back to baseline post- stroke IV Thrombolysis:  No: Out of the time window EVT: Yes: Outside of the 24 hour time window but CTA/CTP of head shows left M1 occlusion with 50 cc of hypoperfusion without core infarct in the left MCA territory. Based on recent studies showing good outcomes in patients treated outside of the 24 hour time window using imaging as a stratification tool, no visible infarct for our patient on either CT or CTP, and high likelihood of severe long term neurological disability without emergent endovascular treatment, there is an acceptable risk/benefit profile favoring mechanical thrombectomy versus no treatment.    NIHSS components Score: Comment  1a Level of Conscious 0[] 1[x] 2[] 3[]     1b LOC Questions 0[] 1[] 2[x]      1c LOC Commands 0[] 1[] 2[x]      2 Best Gaze 0[] 1[x] 2[]      3 Visual 0[x] 1[x] 2[] 3[]     4 Facial Palsy 0[] 1[] 2[x] 3[]     5a Motor Arm - left 0[x]  1[] 2[] 3[] 4[] UN[]   5b Motor Arm - Right 0[] 1[] 2[] 3[] 4[x] UN[]   6a Motor Leg - Left 0[x] 1[] 2[] 3[] 4[] UN[]   6b Motor Leg - Right 0[] 1[x] 2[] 3[] 4[] UN[]   7 Limb Ataxia 0[x] 1[] 2[] 3[] UN[]    8 Sensory 0[] 1[x] 2[] UN[]     9 Best Language 0[] 1[] 2[] 3[x]     10 Dysarthria 0[] 1[] 2[x] UN[]     11  Extinct. and Inattention 0[]  1[x]  2[]       TOTAL:  21      ROS  Unable to ascertain due to dense aphasia.   Past History   Past Medical History:  Diagnosis Date   (HFpEF) heart failure with preserved ejection fraction (HCC)    a. 07/2017 Echo: EF 50-55%, no rwma, mild AS/AI, mod MR, mod dil LA, mod TR, PASP ; b. 03/2021 Echo: EF 60-65%. No rwma. Nl RV fxn. Sev BAE. Mild MR. Mild AI/AS.   Atrial fibrillation (HCC)    Autoimmune hepatitis (HCC)    followed by Dr Marva Panda   CHF (congestive heart failure) [facility])    Fibrocystic breast disease    Glaucoma    Hypercholesterolemia    Hypertension    Hypothyroidism    Inflammatory arthritis    MI (myocardial infarction) (HCC)    Mitral regurgitation    a. 07/2017 Echo: Mod MR; b. 08/2017 Cath: 4+/Severe MR; c. 08/2017 TEE: Mild MR; d.  03/2021 Echo: Mild MR.   Neuropathy    Non-obstructive Coronary artery disease    a. 05/2014 NSTEMI/Cath: LM nl, LAD mild dzs, LCX 60ost hazy (? culprit), RCA mild dzs. EF 60% by echo-->Med Rx; b. 08/2017 Cath: LM 30ost, LAD min irregs, LCX small, 40ost/p, RCA large, min irregs, RPDA/RPL1 nl, EF 50-55%. 4+MR.   Osteoarthritis    Permanent atrial fibrillation (HCC)    a. CHA2DS2VASc = 6-->Xarelto.   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    Past Surgical History:  Procedure Laterality Date   ABDOMINAL HYSTERECTOMY  1963   partial, secondary to fibroids   APPENDECTOMY     Billroth     CARDIAC CATHETERIZATION  05/12/14   [facility]   CARDIOVERSION N/A 12/26/2016   Procedure: CARDIOVERSION;  Surgeon: Iran Ouch, MD;  Location: [facility] ORS;   Service: Cardiovascular;  Laterality: N/A;   CARDIOVERSION N/A 05/16/2017   Procedure: CARDIOVERSION;  Surgeon: Iran Ouch, MD;  Location: [facility] ORS;  Service: Cardiovascular;  Laterality: N/A;   CARDIOVERSION N/A 06/09/2017   Procedure: CARDIOVERSION;  Surgeon: Iran Ouch, MD;  Location: [facility] ORS;  Service: Cardiovascular;  Laterality: N/A;   CARDIOVERSION N/A 11/30/2018   Procedure: CARDIOVERSION;  Surgeon: Iran Ouch, MD;  Location: [facility] ORS;  Service: Cardiovascular;  Laterality: N/A;   CHOLECYSTECTOMY  1998   COLONOSCOPY  2009   PACEMAKER IMPLANT N/A 09/28/2021   Procedure: PACEMAKER IMPLANT;  Surgeon: Lanier Prude, MD;  Location: [facility] INVASIVE CV LAB;  Service: Cardiovascular;  Laterality: N/A;   RIGHT/LEFT HEART CATH AND CORONARY ANGIOGRAPHY Bilateral 08/21/2017   Procedure: RIGHT/LEFT HEART CATH AND CORONARY ANGIOGRAPHY;  Surgeon: Iran Ouch, MD;  Location: [facility] INVASIVE CV LAB;  Service: Cardiovascular;  Laterality: Bilateral;   TEE WITHOUT CARDIOVERSION N/A 08/25/2017   Procedure: TRANSESOPHAGEAL ECHOCARDIOGRAM (TEE);  Surgeon: Dolores Patty, MD;  Location: [facility] ENDOSCOPY;  Service: Cardiovascular;  Laterality: N/A;   UPPER GI ENDOSCOPY  2004    Family History: Family History  Problem Relation Age of Onset   Stroke Mother    Esophageal cancer Brother        also had lung cancer   Ovarian cancer Daughter    Colon cancer Daughter    Breast cancer Neg Hx     Social History  reports that she has never smoked. She has never used smokeless tobacco. She reports that she does not drink alcohol and does not use drugs.  Allergies  Allergen Reactions   Statins Other (See Comments)    Liver damage    Hydroxychloroquine Hives   Haldol [Haloperidol Lactate] Rash   Librax [Chlordiazepoxide-Clidinium] Rash   Plavix [Clopidogrel Bisulfate] Rash   Ramipril Rash    Medications   Current Facility-Administered Medications:    amiodarone (NEXTERONE  PREMIX) 360-4.14 MG/200ML-% (1.8 mg/mL) IV infusion, 60 mg/hr, Intravenous, Continuous, Last Rate: 33.3 mL/hr at 05/13/23 2037, 60 mg/hr at 05/13/23 2037 **FOLLOWED BY** [START ON 05/14/2023] amiodarone (NEXTERONE PREMIX) 360-4.14 MG/200ML-% (1.8 mg/mL) IV infusion, 30 mg/hr, Intravenous, Continuous, Croitoru, Mihai, MD   diltiazem (CARDIZEM) 125 mg in dextrose 5% 125 mL (1 mg/mL) infusion, 5-15 mg/hr, Intravenous, Continuous, Horton, Kristie M, DO, Last Rate: 15 mL/hr at 05/13/23 1829, 15 mg/hr at 05/13/23 1829   metoprolol tartrate (LOPRESSOR) injection 5 mg, 5 mg, Intravenous, Q6H, Croitoru, Mihai, MD, 5 mg at 05/13/23 1931  Current Outpatient Medications:    DULoxetine (CYMBALTA) 20 MG capsule, Take 20 mg  by mouth 2 (two) times daily., Disp: , Rfl:    diltiazem (CARDIZEM CD) 240 MG 24 hr capsule, Take 240 mg by mouth daily., Disp: , Rfl:    dorzolamide (TRUSOPT) 2 % ophthalmic solution, Place 1 drop into the left eye 2 (two) times daily., Disp: , Rfl:    fluticasone (FLONASE) 50 MCG/ACT nasal spray, Place 2 sprays into both nostrils daily as needed for allergies., Disp: , Rfl:    fluticasone-salmeterol (ADVAIR HFA) 115-21 MCG/ACT inhaler, Inhale 2 puffs into the lungs 2 (two) times daily., Disp: 1 each, Rfl: 1   gabapentin (NEURONTIN) 300 MG capsule, One capsule tid and two capsules q hs., Disp: 150 capsule, Rfl: 2   ipratropium (ATROVENT) 0.03 % nasal spray, Place 2 sprays into both nostrils 3 (three) times daily as needed for rhinitis., Disp: , Rfl:    latanoprost (XALATAN) 0.005 % ophthalmic solution, Place 1 drop into both eyes at bedtime. , Disp: , Rfl:    levalbuterol (XOPENEX HFA) 45 MCG/ACT inhaler, Inhale 1 puff into the lungs every 6 (six) hours as needed for wheezing or shortness of breath., Disp: 1 each, Rfl: 1   levothyroxine (SYNTHROID) 137 MCG tablet, TAKE 1 TABLET(137 MCG) BY MOUTH DAILY BEFORE BREAKFAST, Disp: 60 tablet, Rfl: 0   metoprolol tartrate (LOPRESSOR) 100 MG tablet, TAKE 1  AND 1/2 TABLETS(150 MG) BY MOUTH TWICE DAILY, Disp: 270 tablet, Rfl: 3   Multiple Vitamin (MULTI-VITAMIN) tablet, Take 1 tablet by mouth at bedtime. , Disp: , Rfl:    Probiotic Product (ALIGN) 4 MG CAPS, Take one capsule q day, Disp: 30 capsule, Rfl: 0   torsemide (DEMADEX) 20 MG tablet, Take 1 tablet (20 mg total) by mouth daily., Disp: 90 tablet, Rfl: 3   traMADol (ULTRAM) 50 MG tablet, Take 1 tablet (50 mg total) by mouth 2 (two) times daily as needed., Disp: 60 tablet, Rfl: 0   ursodiol (ACTIGALL) 300 MG capsule, Take 300-600 mg by mouth See admin instructions. Take 2 capsules (600 mg) daily in the morning   & 1 capsule (300 mg) at night., Disp: , Rfl:    XARELTO 15 MG TABS tablet, TAKE 1 TABLET(15 MG) BY MOUTH DAILY WITH SUPPER, Disp: 90 tablet, Rfl: 1  Vitals   Vitals:   05/13/23 2018 05/13/23 2030 05/13/23 2045 05/13/23 2100  BP:  138/85 (!) 154/93 (!) 147/104  Pulse:   (!) 139   Resp:  (!) 23 19 12   Temp: (!) 97.4 F (36.3 C)     TempSrc: Temporal     SpO2:   100%   Weight:      Height:        Body mass index is 24.8 kg/m.  Physical Exam   Physical Exam  HEENT-  Normocephalic. Bruising on the right.     Lungs- Respirations unlabored Extremities- Bruising on the right.   Neurological Examination Mental Status: Awake. Severe expressive and receptive aphasia, with no verbal output. Does not follow verbal commands. Will make eye contact.  . Cranial Nerves: II: Blinks to threat consistently in temporal field of left eye. Inconsistent blink to threat in temporal visual field of right eye. PERRL  III,IV, VI: No ptosis. Eyes are conjugate. Left gaze preference. Will track examiner's face to the midline but not past the midline to the right on 3 separate trials, but did initiate saccades once to the right past the midline. No nystagmus. Her left gaze preference can be overcome with oculocephalic maneuver.   V: Blinks to  periorbital light touch stimuli more briskly on the left than  the right.   VII: Does not smile to command, but NL is decreased on the right at rest.  VIII: Does gaze towards examiner when he vocalizes.  IX,X: Gag reflex deferred XI: Head preferentially rotated to the left XII: Not following commands for testing Motor/Sensory: LUE remains elevated after passively lifted and released by examiner. Also squeezes examiners hand with her left hand. Tone to LUE normal. Briskly reacts to pinch RUE: Flaccid tone. No spontaneous or stimulus-induced movement. Arm falls immediately to bed after passive elevation and release. Slowly reacts to pinch after a delay.  LLE briskly withdraws to noxious plantar stimulation RLE also with brisk withdrawal, but less so than on the left.   Deep Tendon Reflexes: 2+ left biceps and brachioradialis. 1+ right biceps and brachioradialis. Right toe upgoing, left toe downgoing Cerebellar: Unable to assess Gait: Unable to assess  Labs/Imaging/Neurodiagnostic studies   CBC:  Recent Labs  Lab Jun 04, 2023 1518 2023-06-04 1526 June 04, 2023 1857  WBC 14.0*  --   --   HGB 15.6* 16.7* 15.3*  HCT 49.2* 49.0* 45.0  MCV 98.0  --   --   PLT 206  --   --    Basic Metabolic Panel:  Lab Results  Component Value Date   NA 144 2023-06-04   K 3.8 06/04/2023   CO2 17 (L) Jun 04, 2023   GLUCOSE 135 (H) 06-04-23   BUN 31 (H) Jun 04, 2023   CREATININE 1.10 (H) Jun 04, 2023   CALCIUM 9.5 06/04/2023   GFRNONAA 41 (L) 04-Jun-2023   GFRAA 24 (L) 02/10/2020   Lipid Panel:  Lab Results  Component Value Date   LDLCALC 81 02/03/2023   HgbA1c:  Lab Results  Component Value Date   HGBA1C 5.6 01/27/2018   Urine Drug Screen: No results found for: "LABOPIA", "COCAINSCRNUR", "LABBENZ", "AMPHETMU", "THCU", "LABBARB"  Alcohol Level     Component Value Date/Time   ETH <10 06-04-23 1518   INR  Lab Results  Component Value Date   INR 1.2 2023-06-04   APTT  Lab Results  Component Value Date   APTT 30 08/21/2017     ASSESSMENT  89 year old  female with a PMHx of HFpEF, atrial fibrillation (on Xarelto), Autoimmune hepatitis, Fibrocystic breast disease, Glaucoma, Hypercholesterolemia, Hypertension, Hypothyroidism, Inflammatory arthritis, CAD, Mitral regurgitation, Neuropathy, Osteoarthritis and PUD who presented to the ED from home this afternoon after being found down at home, awake but verbally unresponsive and with right sided weakness. LKN was on Sunday when family last interacted with her. Family member who found her noted her to be covered in fresh/wet feces, which suggests that the time of symptom onset may have been relatively recent, possibly within the past 24 hours or less.  - Exam reveals deficits best localizable to the left cerebral hemisphere.  - CT head: No acute intracranial pathology. Small-vessel white matter disease and unchanged left occipital encephalomalacia.  - CTA of head and neck: Occluded versus severely stenotic distal left M1 MCA with asymmetrically diminished distal left MCA opacification. Approximately 50 mL of left MCA penumbra without evidence of core infarct by CT perfusion. Approximately 60% stenosis of the right ICA at the skull base. - Other CT findings: No displaced fractures or dislocations of the facial bones. Small air-fluid level in the right maxillary sinus. No overlying fracture. Patient is partially edentulous with erosion of the left maxillary alveolar bone into the left maxillary sinus. Correlate for sinusitis. No fracture or static subluxation of  the cervical spine. Focally moderate disc space height loss and osteophytosis of the lower cervical levels. - Labs: - CK elevated at 472, most likely secondary to being found down on the hardwood floor of her living room - Lactate 2.2 - Elevated WBC of 10.6 - Impression: Acute lett MCA territory ischemic stroke secondary to distal left M1 occlusion.  - The patient is outside of the TNK time window - The patient is potentially a VIR candidate. She is  outside of the 24 hour time window but CTA/CTP of head shows left M1 occlusion with 50 cc of hypoperfusion without core infarct in the left MCA territory. Based on recent studies showing good outcomes in patients treated outside of the 24 hour time window using imaging as a stratification tool Celine Mans, et al. Thrombectomy 24 hours after stroke: beyond DAWN. J NeuroIntervent Surg 2018;10:1039-1042), no visible infarct for our patient on either CT or CTP, and high likelihood of severe long term neurological disability without emergent endovascular treatment, there is an acceptable risk/benefit profile favoring mechanical thrombectomy versus no treatment.  Risks/benefits of the procedure were discussed extensively with patient's family, including a possible higher risk due to performing > 24 hours after LKN in the context of suspicion that her stroke occurred more recently. Discussed the approximately 50% chance of significant improvement if performed within 24 hours of symptom onset, relative to an approximate 10% chance or greater of subarachnoid hemorrhage with possibility of significant worsening including death. The patient's family expressed understanding and provided informed consent to proceed with VIR. All questions answered.     RECOMMENDATIONS  - Admitting to Neuro ICU after VIR.  - Post-VIR order set to include frequent neuro checks and BP management.  - No antiplatelet medications or anticoagulants for at least 24 hours following VIR, unless a stent is placed.  - DVT prophylaxis with SCDs.  - If repeat imaging at 24 hours is negative for hemorrhagic conversion, can restart Xarelto - TTE.  - PT/OT/Speech.  - NPO until passes swallow evaluation.  - Telemetry monitoring - Fasting lipid panel, HgbA1c - Neurosurgeon for assistance in management of her atrial fibrillation with amiodarone and Diltiazem. Ceftriaxone for possible UTI. - May not be able to obtain an MRI due to her  pacemaker. Stroke Team to obtain model number in the AM to determine if it is MRI compatible.  - Has a significant statin allergy (liver damage) - Continue home synthroid for her hypothyroidism. Obtaining a TSH level.  ______________________________________________________________________  70 minutes spent in the emergent neurological evaluation and management of this critically ill patient  Signed, Otelia Limes, Leilah Polimeni, MD [facility]

## 2023-05-13 NOTE — Progress Notes (Addendum)
 Elink monitoring for the code sepsis protocol.  Sepsis called an hour after pt received Antibiotic.

## 2023-05-13 NOTE — ED Provider Notes (Signed)
 [place] EMERGENCY DEPARTMENT AT [facility] Provider Note   CSN: [id number] Arrival date & time: 05/13/23  1513     History  Chief Complaint  Patient presents with   Altered Mental Status    Ruth Roth is a 89 y.o. female.  HPI   89 year old female presents emergency department by EMS after being found down on the ground.  Presumably for the past 2 days.  Patient missed a doctor appointment this morning and had not been heard from throat 2 days ago.  Patient was found laying on her right side in her home with bruising to the right side of the head, shoulder and hip.  Patient does have history of A-fib, noted to be on Xarelto.  Level 2 trauma activated.  C-collar placed.  Patient's eyes are periodically open, sometimes she is moaning.  She moves both of her legs purposefully.  At this time is nonverbal.  History obtained from EMS, no family at bedside.  Home Medications Prior to Admission medications   Medication Sig Start Date End Date Taking? Authorizing Provider  diltiazem (CARDIZEM CD) 240 MG 24 hr capsule Take 240 mg by mouth daily.    [provider]  dorzolamide (TRUSOPT) 2 % ophthalmic solution Place 1 drop into the left eye 2 (two) times daily.    [provider]  fluticasone (FLONASE) 50 MCG/ACT nasal spray Place 2 sprays into both nostrils daily as needed for allergies. 08/27/16   [provider]  fluticasone-salmeterol (ADVAIR HFA) 115-21 MCG/ACT inhaler Inhale 2 puffs into the lungs 2 (two) times daily. Patient not taking: Reported on 03/26/2023 01/02/23   Allegra Grana, FNP  gabapentin (NEURONTIN) 300 MG capsule One capsule tid and two capsules q hs. 01/28/20   Dale [place], MD  ipratropium (ATROVENT) 0.03 % nasal spray Place 2 sprays into both nostrils 3 (three) times daily as needed for rhinitis. 04/19/20   [provider]  latanoprost (XALATAN) 0.005 % ophthalmic solution Place 1 drop into both eyes at bedtime.   07/29/18   [provider]  levalbuterol (XOPENEX HFA) 45 MCG/ACT inhaler Inhale 1 puff into the lungs every 6 (six) hours as needed for wheezing or shortness of breath. Patient not taking: Reported on 03/26/2023 12/16/22   Allegra Grana, FNP  levothyroxine (SYNTHROID) 137 MCG tablet TAKE 1 TABLET(137 MCG) BY MOUTH DAILY BEFORE BREAKFAST 03/11/23   Dale [place], MD  metoprolol tartrate (LOPRESSOR) 100 MG tablet TAKE 1 AND 1/2 TABLETS(150 MG) BY MOUTH TWICE DAILY 03/14/23   Iran Ouch, MD  Multiple Vitamin (MULTI-VITAMIN) tablet Take 1 tablet by mouth at bedtime.     [provider]  Probiotic Product (ALIGN) 4 MG CAPS Take one capsule q day 08/10/21   Dale [place], MD  torsemide (DEMADEX) 20 MG tablet Take 1 tablet (20 mg total) by mouth daily. 11/18/22 03/26/23  Laurey Morale, MD  traMADol (ULTRAM) 50 MG tablet Take 1 tablet (50 mg total) by mouth 2 (two) times daily as needed. 03/17/23   Dale [place], MD  ursodiol (ACTIGALL) 300 MG capsule Take 300-600 mg by mouth See admin instructions. Take 2 capsules (600 mg) daily in the morning   & 1 capsule (300 mg) at night.    [provider]  XARELTO 15 MG TABS tablet TAKE 1 TABLET(15 MG) BY MOUTH DAILY WITH SUPPER 02/04/23   Bensimhon, Bevelyn Buckles, MD      Allergies    Statins, Hydroxychloroquine, Haldol [haloperidol lactate],  Librax [chlordiazepoxide-clidinium], Plavix [clopidogrel bisulfate], and Ramipril    Review of Systems   Review of Systems  Unable to perform ROS: Acuity of condition    Physical Exam Updated Vital Signs BP 120/85   Pulse (!) 147   Resp (!) 23   Ht 5\' 3"  (1.6 m)   Wt 63.5 kg   SpO2 100%   BMI 24.80 kg/m  Physical Exam Vitals and nursing note reviewed.  Constitutional:      Comments: Opens eyes to name  HENT:     Head:     Comments: Right sided head trauma, parietal abrasion small hematoma    Mouth/Throat:     Mouth: Mucous membranes are moist.  Eyes:     Pupils: Pupils are  equal, round, and reactive to light.  Neck:     Comments: C-collar placed Cardiovascular:     Rate and Rhythm: Tachycardia present.  Pulmonary:     Comments: Diminished breath sounds bilaterally, increased work of breathing, NRB in place Abdominal:     Palpations: Abdomen is soft.  Musculoskeletal:     Comments: Right shoulder bruising but no obvious deformity, full range of motion.  Pelvis is stable and appears nontender.  No other traumatic injury noted on the extremities.  Skin:    General: Skin is warm.  Neurological:     Comments: Currently nonverbal, withdrawing from pain in both lower extremities, intermittently moves both upper extremities but does not follow commands, intermittently moaning, protecting her airway  Psychiatric:        Mood and Affect: Mood normal.     ED Results / Procedures / Treatments   Labs (all labs ordered are listed, but only abnormal results are displayed) Labs Reviewed  CBC - Abnormal; Notable for the following components:      Result Value   WBC 14.0 (*)    Hemoglobin 15.6 (*)    HCT 49.2 (*)    RDW 18.2 (*)    All other components within normal limits  I-STAT CHEM 8, ED - Abnormal; Notable for the following components:   Chloride 113 (*)    BUN 31 (*)    Creatinine, Ser 1.10 (*)    Glucose, Bld 135 (*)    Calcium, Ion 1.13 (*)    TCO2 18 (*)    Hemoglobin 16.7 (*)    HCT 49.0 (*)    All other components within normal limits  I-STAT CG4 LACTIC ACID, ED - Abnormal; Notable for the following components:   Lactic Acid, Venous 3.2 (*)    All other components within normal limits  PROTIME-INR  COMPREHENSIVE METABOLIC PANEL  ETHANOL  URINALYSIS, ROUTINE W REFLEX MICROSCOPIC  BRAIN NATRIURETIC PEPTIDE  MAGNESIUM  SAMPLE TO BLOOD BANK  TROPONIN I (HIGH SENSITIVITY)    EKG None  Radiology No results found.  Procedures .Critical Care  Performed by: Rozelle Logan, DO Authorized by: Rozelle Logan, DO   Critical care  provider statement:    Critical care time (minutes):  75   Critical care time was exclusive of:  Separately billable procedures and treating other patients   Critical care was necessary to treat or prevent imminent or life-threatening deterioration of the following conditions:  CNS failure or compromise   Critical care was time spent personally by me on the following activities:  Development of treatment plan with patient or surrogate, discussions with consultants, evaluation of patient's response to treatment, examination of patient, ordering and review of laboratory studies, ordering and review  of radiographic studies, ordering and performing treatments and interventions, pulse oximetry, re-evaluation of patient's condition and review of old charts   I assumed direction of critical care for this patient from another provider in my specialty: no     Care discussed with: admitting provider       Medications Ordered in ED Medications  sodium chloride 0.9 % bolus 1,000 mL (has no administration in time range)    ED Course/ Medical Decision Making/ A&P                                 Medical Decision Making Amount and/or Complexity of Data Reviewed Labs: ordered. Radiology: ordered.  Risk Prescription drug management. Decision regarding hospitalization.   89 year old female presents emergency department EMS after being found down at home.  Was last spoken to about 48 hours ago.  Is tachycardic, altered.  Is moving both of her legs but not moving or following commands in her upper extremities.  Eyes are midline, pupils are equal and reactive, she is nonverbal at this time.  No family at bedside.  EKG shows atrial flutter which the patient has a history of.  Potentially out of her medication for the past 2 days secondary to this fall, Cardizem dose and infusion ordered.  Blood work shows a mild AKI but otherwise stable electrolytes, she has a mild leukocytosis and normal magnesium.  CK is  1135.  Urinalysis looks suspicious for UTI.  Patient has been difficult to rate control.  Cardiology consult placed.  They will help [place] with rate control and continue to consult.  CT imaging of the head, spine and chest abdomen pelvis showed no acute finding.  No bleed in the brain to explain altered mental status.  Patient continues to be nonverbal, not moving her right upper extremity but has bruising along the shoulder.  X-ray does not show any acute fracture.  Given the timeframe of the last known normal initial plan was for MRI.  However given the fact that the patient has a pacemaker I consulted with neurology, Dr. Amada Jupiter.  Concern for not moving the right upper extremity and being nonverbal which is a change for her so we will obtain a CT of the head and neck with perfusion.  Admitted patient to hospitalist.  CT of the head and neck perfusion shows a an M1 occlusion with penumbra.  Again we do not know the last known normal but since this is acute findings on the imaging neurology was reconsulted.  Dr. Otelia Limes reviewed the imaging and reached out to interventional list.  After discussion with the family they have decided to make the patient a code IR and take her for procedure.        Final Clinical Impression(s) / ED Diagnoses Final diagnoses:  None    Rx / DC Orders ED Discharge Orders     None         Rozelle Logan, DO 05/13/23 2224

## 2023-05-13 NOTE — ED Notes (Signed)
 Patient transported to CT

## 2023-05-13 NOTE — ED Notes (Signed)
 Patient transported to CT with primary rn

## 2023-05-13 NOTE — Anesthesia Procedure Notes (Signed)
 Procedure Name: Intubation Date/Time: 05/13/2023 10:43 PM  Performed by: Rachel Moulds, CRNAPre-anesthesia Checklist: Patient identified, Emergency Drugs available, Suction available, Patient being monitored and Timeout performed Patient Re-evaluated:Patient Re-evaluated prior to induction Oxygen Delivery Method: Circle system utilized Preoxygenation: Pre-oxygenation with 100% oxygen Induction Type: IV induction, Rapid sequence and Cricoid Pressure applied Laryngoscope Size: Mac and 4 Grade View: Grade IV Tube type: Oral Tube size: 7.0 mm Number of attempts: 1 Airway Equipment and Method: Stylet Placement Confirmation: CO2 detector, positive ETCO2, ETT inserted through vocal cords under direct vision and breath sounds checked- equal and bilateral Secured at: 21 cm Tube secured with: Tape Dental Injury: Teeth and Oropharynx as per pre-operative assessment

## 2023-05-13 NOTE — ED Notes (Signed)
 Pt has bilateral bruising on her hips.

## 2023-05-14 ENCOUNTER — Inpatient Hospital Stay (HOSPITAL_COMMUNITY)

## 2023-05-14 ENCOUNTER — Encounter (HOSPITAL_COMMUNITY): Payer: Self-pay | Admitting: Radiology

## 2023-05-14 DIAGNOSIS — J4530 Mild persistent asthma, uncomplicated: Secondary | ICD-10-CM

## 2023-05-14 DIAGNOSIS — N3000 Acute cystitis without hematuria: Secondary | ICD-10-CM

## 2023-05-14 DIAGNOSIS — N1832 Chronic kidney disease, stage 3b: Secondary | ICD-10-CM

## 2023-05-14 DIAGNOSIS — I5032 Chronic diastolic (congestive) heart failure: Secondary | ICD-10-CM

## 2023-05-14 DIAGNOSIS — I4892 Unspecified atrial flutter: Secondary | ICD-10-CM | POA: Diagnosis not present

## 2023-05-14 DIAGNOSIS — M6282 Rhabdomyolysis: Secondary | ICD-10-CM | POA: Diagnosis not present

## 2023-05-14 DIAGNOSIS — I472 Ventricular tachycardia, unspecified: Secondary | ICD-10-CM

## 2023-05-14 DIAGNOSIS — E872 Acidosis, unspecified: Secondary | ICD-10-CM

## 2023-05-14 DIAGNOSIS — G9341 Metabolic encephalopathy: Secondary | ICD-10-CM | POA: Diagnosis not present

## 2023-05-14 DIAGNOSIS — K7290 Hepatic failure, unspecified without coma: Secondary | ICD-10-CM

## 2023-05-14 DIAGNOSIS — I484 Atypical atrial flutter: Secondary | ICD-10-CM | POA: Diagnosis not present

## 2023-05-14 DIAGNOSIS — A419 Sepsis, unspecified organism: Secondary | ICD-10-CM | POA: Diagnosis not present

## 2023-05-14 DIAGNOSIS — E038 Other specified hypothyroidism: Secondary | ICD-10-CM

## 2023-05-14 DIAGNOSIS — I63412 Cerebral infarction due to embolism of left middle cerebral artery: Secondary | ICD-10-CM | POA: Diagnosis not present

## 2023-05-14 DIAGNOSIS — Z8679 Personal history of other diseases of the circulatory system: Secondary | ICD-10-CM

## 2023-05-14 DIAGNOSIS — D631 Anemia in chronic kidney disease: Secondary | ICD-10-CM

## 2023-05-14 DIAGNOSIS — R7989 Other specified abnormal findings of blood chemistry: Secondary | ICD-10-CM

## 2023-05-14 DIAGNOSIS — W19XXXA Unspecified fall, initial encounter: Secondary | ICD-10-CM

## 2023-05-14 DIAGNOSIS — I639 Cerebral infarction, unspecified: Secondary | ICD-10-CM

## 2023-05-14 DIAGNOSIS — N179 Acute kidney failure, unspecified: Secondary | ICD-10-CM

## 2023-05-14 DIAGNOSIS — N183 Chronic kidney disease, stage 3 unspecified: Secondary | ICD-10-CM

## 2023-05-14 DIAGNOSIS — E039 Hypothyroidism, unspecified: Secondary | ICD-10-CM

## 2023-05-14 DIAGNOSIS — I4821 Permanent atrial fibrillation: Secondary | ICD-10-CM

## 2023-05-14 DIAGNOSIS — Z978 Presence of other specified devices: Secondary | ICD-10-CM

## 2023-05-14 DIAGNOSIS — I6602 Occlusion and stenosis of left middle cerebral artery: Secondary | ICD-10-CM | POA: Diagnosis present

## 2023-05-14 DIAGNOSIS — Y92009 Unspecified place in unspecified non-institutional (private) residence as the place of occurrence of the external cause: Secondary | ICD-10-CM

## 2023-05-14 HISTORY — PX: IR CT HEAD LTD: IMG2386

## 2023-05-14 HISTORY — PX: IR PERCUTANEOUS ART THROMBECTOMY/INFUSION INTRACRANIAL INC DIAG ANGIO: IMG6087

## 2023-05-14 LAB — CBC WITH DIFFERENTIAL/PLATELET
Abs Immature Granulocytes: 0.04 10*3/uL (ref 0.00–0.07)
Basophils Absolute: 0 10*3/uL (ref 0.0–0.1)
Basophils Relative: 0 %
Eosinophils Absolute: 0 10*3/uL (ref 0.0–0.5)
Eosinophils Relative: 0 %
HCT: 41.4 % (ref 36.0–46.0)
Hemoglobin: 13.1 g/dL (ref 12.0–15.0)
Immature Granulocytes: 0 %
Lymphocytes Relative: 5 %
Lymphs Abs: 0.5 10*3/uL — ABNORMAL LOW (ref 0.7–4.0)
MCH: 30.9 pg (ref 26.0–34.0)
MCHC: 31.6 g/dL (ref 30.0–36.0)
MCV: 97.6 fL (ref 80.0–100.0)
Monocytes Absolute: 1.2 10*3/uL — ABNORMAL HIGH (ref 0.1–1.0)
Monocytes Relative: 11 %
Neutro Abs: 8.9 10*3/uL — ABNORMAL HIGH (ref 1.7–7.7)
Neutrophils Relative %: 84 %
Platelets: 170 10*3/uL (ref 150–400)
RBC: 4.24 MIL/uL (ref 3.87–5.11)
RDW: 18.3 % — ABNORMAL HIGH (ref 11.5–15.5)
WBC: 10.6 10*3/uL — ABNORMAL HIGH (ref 4.0–10.5)
nRBC: 0 % (ref 0.0–0.2)

## 2023-05-14 LAB — LIPID PANEL
Cholesterol: 172 mg/dL (ref 0–200)
HDL: 67 mg/dL (ref >40)
LDL Cholesterol: 67 mg/dL (ref 0–99)
Total CHOL/HDL Ratio: 2.6 ratio
Triglycerides: 189 mg/dL — ABNORMAL HIGH (ref <150)
VLDL: 38 mg/dL (ref 0–40)

## 2023-05-14 LAB — PROTIME-INR
INR: 1.2 (ref 0.8–1.2)
Prothrombin Time: 14.9 s (ref 11.4–15.2)

## 2023-05-14 LAB — COMPREHENSIVE METABOLIC PANEL
ALT: 41 U/L (ref 0–44)
AST: 43 U/L — ABNORMAL HIGH (ref 15–41)
Albumin: 3.2 g/dL — ABNORMAL LOW (ref 3.5–5.0)
Alkaline Phosphatase: 79 U/L (ref 38–126)
Anion gap: 15 (ref 5–15)
BUN: 31 mg/dL — ABNORMAL HIGH (ref 8–23)
CO2: 19 mmol/L — ABNORMAL LOW (ref 22–32)
Calcium: 9 mg/dL (ref 8.9–10.3)
Chloride: 108 mmol/L (ref 98–111)
Creatinine, Ser: 1.19 mg/dL — ABNORMAL HIGH (ref 0.44–1.00)
GFR, Estimated: 43 mL/min — ABNORMAL LOW (ref >60)
Glucose, Bld: 145 mg/dL — ABNORMAL HIGH (ref 70–99)
Potassium: 3.7 mmol/L (ref 3.5–5.1)
Sodium: 142 mmol/L (ref 135–145)
Total Bilirubin: 0.7 mg/dL (ref 0.0–1.2)
Total Protein: 5.8 g/dL — ABNORMAL LOW (ref 6.5–8.1)

## 2023-05-14 LAB — POCT I-STAT 7, (LYTES, BLD GAS, ICA,H+H)
Acid-base deficit: 7 mmol/L — ABNORMAL HIGH (ref 0.0–2.0)
Bicarbonate: 17.2 mmol/L — ABNORMAL LOW (ref 20.0–28.0)
Calcium, Ion: 1.21 mmol/L (ref 1.15–1.40)
HCT: 40 % (ref 36.0–46.0)
Hemoglobin: 13.6 g/dL (ref 12.0–15.0)
O2 Saturation: 100 %
Potassium: 3.3 mmol/L — ABNORMAL LOW (ref 3.5–5.1)
Sodium: 140 mmol/L (ref 135–145)
TCO2: 18 mmol/L — ABNORMAL LOW (ref 22–32)
pCO2 arterial: 29.4 mmHg — ABNORMAL LOW (ref 32–48)
pH, Arterial: 7.376 (ref 7.35–7.45)
pO2, Arterial: 410 mmHg — ABNORMAL HIGH (ref 83–108)

## 2023-05-14 LAB — MRSA NEXT GEN BY PCR, NASAL: MRSA by PCR Next Gen: DETECTED — AB

## 2023-05-14 LAB — LACTIC ACID, PLASMA: Lactic Acid, Venous: 2.2 mmol/L (ref 0.5–1.9)

## 2023-05-14 LAB — GLUCOSE, CAPILLARY
Glucose-Capillary: 102 mg/dL — ABNORMAL HIGH (ref 70–99)
Glucose-Capillary: 104 mg/dL — ABNORMAL HIGH (ref 70–99)
Glucose-Capillary: 113 mg/dL — ABNORMAL HIGH (ref 70–99)
Glucose-Capillary: 121 mg/dL — ABNORMAL HIGH (ref 70–99)
Glucose-Capillary: 128 mg/dL — ABNORMAL HIGH (ref 70–99)
Glucose-Capillary: 93 mg/dL (ref 70–99)

## 2023-05-14 LAB — RESP PANEL BY RT-PCR (RSV, FLU A&B, COVID)  RVPGX2
Influenza A by PCR: NEGATIVE
Influenza B by PCR: NEGATIVE
Resp Syncytial Virus by PCR: NEGATIVE
SARS Coronavirus 2 by RT PCR: NEGATIVE

## 2023-05-14 LAB — CK: Total CK: 472 U/L — ABNORMAL HIGH (ref 38–234)

## 2023-05-14 LAB — HEMOGLOBIN A1C
Hgb A1c MFr Bld: 5.4 % (ref 4.8–5.6)
Mean Plasma Glucose: 108.28 mg/dL

## 2023-05-14 LAB — APTT: aPTT: 26 s (ref 24–36)

## 2023-05-14 MED ORDER — MUPIROCIN 2 % EX OINT
1.0000 | TOPICAL_OINTMENT | Freq: Two times a day (BID) | CUTANEOUS | Status: AC
Start: 2023-05-14 — End: 2023-05-18
  Administered 2023-05-14 – 2023-05-18 (×10): 1 via NASAL
  Filled 2023-05-14: qty 22

## 2023-05-14 MED ORDER — ACETAMINOPHEN 160 MG/5ML PO SOLN
650.0000 mg | ORAL | Status: DC | PRN
  Administered 2023-05-17 – 2023-05-20 (×6): 650 mg
  Filled 2023-05-14 (×6): qty 20.3

## 2023-05-14 MED ORDER — LACTATED RINGERS IV BOLUS
500.0000 mL | Freq: Once | INTRAVENOUS | Status: AC
  Administered 2023-05-14: 500 mL via INTRAVENOUS

## 2023-05-14 MED ORDER — POLYETHYLENE GLYCOL 3350 17 G PO PACK
17.0000 g | PACK | Freq: Every day | ORAL | Status: DC
  Administered 2023-05-14: 17 g
  Filled 2023-05-14 (×2): qty 1

## 2023-05-14 MED ORDER — FENTANYL CITRATE PF 50 MCG/ML IJ SOSY: 25.0000 ug | PREFILLED_SYRINGE | INTRAMUSCULAR | Status: DC | PRN

## 2023-05-14 MED ORDER — CHLORHEXIDINE GLUCONATE CLOTH 2 % EX PADS
6.0000 | MEDICATED_PAD | Freq: Every day | CUTANEOUS | Status: DC
  Administered 2023-05-14 – 2023-05-21 (×10): 6 via TOPICAL

## 2023-05-14 MED ORDER — ORAL CARE MOUTH RINSE
15.0000 mL | OROMUCOSAL | Status: DC
  Administered 2023-05-14 – 2023-05-15 (×18): 15 mL via OROMUCOSAL

## 2023-05-14 MED ORDER — POTASSIUM CHLORIDE 20 MEQ PO PACK
40.0000 meq | PACK | Freq: Once | ORAL | Status: AC
  Administered 2023-05-14: 40 meq
  Filled 2023-05-14: qty 2

## 2023-05-14 MED ORDER — PROPOFOL 500 MG/50ML IV EMUL
INTRAVENOUS | Status: DC | PRN
  Administered 2023-05-14: 50 ug/kg/min via INTRAVENOUS

## 2023-05-14 MED ORDER — METOPROLOL TARTRATE 5 MG/5ML IV SOLN
2.5000 mg | Freq: Four times a day (QID) | INTRAVENOUS | Status: DC
  Administered 2023-05-14 – 2023-05-16 (×5): 2.5 mg via INTRAVENOUS
  Filled 2023-05-14 (×4): qty 5

## 2023-05-14 MED ORDER — PROPOFOL 1000 MG/100ML IV EMUL
0.0000 ug/kg/min | INTRAVENOUS | Status: DC
  Administered 2023-05-14: 40 ug/kg/min via INTRAVENOUS
  Filled 2023-05-14: qty 100

## 2023-05-14 MED ORDER — ALBUMIN HUMAN 25 % IV SOLN
25.0000 g | Freq: Once | INTRAVENOUS | Status: AC
  Administered 2023-05-14: 12.5 g via INTRAVENOUS
  Filled 2023-05-14: qty 100

## 2023-05-14 MED ORDER — FAMOTIDINE 20 MG PO TABS: 20.0000 mg | ORAL_TABLET | Freq: Every day | ORAL | Status: DC

## 2023-05-14 MED ORDER — ACETAMINOPHEN 325 MG PO TABS
650.0000 mg | ORAL_TABLET | ORAL | Status: DC | PRN
  Administered 2023-05-17: 650 mg via ORAL
  Filled 2023-05-14: qty 2

## 2023-05-14 MED ORDER — FAMOTIDINE 20 MG PO TABS
20.0000 mg | ORAL_TABLET | Freq: Two times a day (BID) | ORAL | Status: DC
  Administered 2023-05-14: 20 mg
  Filled 2023-05-14: qty 1

## 2023-05-14 MED ORDER — CLEVIDIPINE BUTYRATE 0.5 MG/ML IV EMUL
0.0000 mg/h | INTRAVENOUS | Status: DC
  Administered 2023-05-14: 2 mg/h via INTRAVENOUS

## 2023-05-14 MED ORDER — ACETAMINOPHEN 650 MG RE SUPP: 650.0000 mg | RECTAL | Status: DC | PRN

## 2023-05-14 MED ORDER — ENOXAPARIN SODIUM 30 MG/0.3ML IJ SOSY
30.0000 mg | PREFILLED_SYRINGE | INTRAMUSCULAR | Status: DC
  Administered 2023-05-15: 30 mg via SUBCUTANEOUS
  Filled 2023-05-14: qty 0.3

## 2023-05-14 MED ORDER — SODIUM CHLORIDE 0.9 % IV SOLN: INTRAVENOUS | Status: DC

## 2023-05-14 MED ORDER — ORAL CARE MOUTH RINSE: 15.0000 mL | OROMUCOSAL | Status: DC | PRN

## 2023-05-14 MED ORDER — DOCUSATE SODIUM 50 MG/5ML PO LIQD
100.0000 mg | Freq: Two times a day (BID) | ORAL | Status: DC
  Administered 2023-05-14 – 2023-05-17 (×4): 100 mg
  Filled 2023-05-14 (×4): qty 10

## 2023-05-14 MED ORDER — ASPIRIN 300 MG RE SUPP
300.0000 mg | Freq: Every day | RECTAL | Status: DC
  Administered 2023-05-14 – 2023-05-15 (×2): 300 mg via RECTAL
  Filled 2023-05-14 (×3): qty 1

## 2023-05-14 MED ORDER — LACTATED RINGERS IV BOLUS
500.0000 mL | Freq: Once | INTRAVENOUS | Status: DC
Start: 2023-05-14 — End: 2023-05-14

## 2023-05-14 MED ORDER — FAMOTIDINE 20 MG PO TABS: 20.0000 mg | ORAL_TABLET | Freq: Two times a day (BID) | ORAL | Status: DC

## 2023-05-14 MED ORDER — GERHARDT'S BUTT CREAM
TOPICAL_CREAM | Freq: Three times a day (TID) | CUTANEOUS | Status: DC
  Administered 2023-05-14 – 2023-05-21 (×4): 1 via TOPICAL
  Filled 2023-05-14: qty 60

## 2023-05-14 MED ORDER — VASOPRESSIN 20 UNITS/100 ML INFUSION FOR SHOCK: 0.0000 [IU]/min | INTRAVENOUS | Status: DC

## 2023-05-14 NOTE — Progress Notes (Signed)
 [facility] ADULT ICU REPLACEMENT PROTOCOL   The patient does apply for the [facility] Adult ICU Electrolyte Replacment Protocol based on the criteria listed below:   1.Exclusion criteria: TCTS, ECMO, Dialysis, and Myasthenia Gravis patients 2. Is GFR >/= 30 ml/min? Yes.    Patient's GFR today is 43 3. Is SCr </= 2? Yes.   Patient's SCr is 1.19 mg/dL 4. Did SCr increase >/= 0.5 in 24 hours? No. 5.Pt's weight >40kg  Yes.   6. Abnormal electrolyte(s): potassium 3.7  7. Electrolytes replaced per protocol 8.  Call MD STAT for K+ </= 2.5, Phos </= 1, or Mag </= 1 Physician:  protocol  Melvern Banker 05/14/2023 6:41 AM

## 2023-05-14 NOTE — Consult Note (Addendum)
 NAME:  Ruth Roth, MRN:  [id number], DOB:  June 25, 1928, LOS: 1 ADMISSION DATE:  05/13/2023, CONSULTATION DATE: 3/5 REFERRING MD: Dr. Otelia Limes, CHIEF COMPLAINT: Stroke  History of Present Illness:  Patient is encephalopathic and/or intubated; therefore, history has been obtained from chart review.  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  The patient was last known well 3/2.  When she missed a doctor's appointment on 3/4, with a check on her and had to break down the door to access the home.  She was found to be lying on the ground with multiple bruises.  She was transported to the ED as a level 2 trauma.  Upon arrival to the emergency department she was found to have rapid atrial flutter as well as evidence of UTI on labs.  Other laboratory evaluation significant for CK of 1135.  Trauma workup was unremarkable.  Cardiology was consulted for atrial fibrillation/flutter.  She was started on diltiazem infusion as well as IV amiodarone and beta-blockers.  Recommended IV heparin if safe from a trauma/neuro perspective.  There is also concern for stroke potentially provoking the fall and neurology was consulted.  CT angiogram of the head demonstrated occluded versus severely stenotic distal left M1 MCA with 50 mL of left MCA penumbra.  Options were considered and ultimately the patient was taken to interventional radiology for mechanical thrombectomy.  ATICI 3 revascularization result was achieved.  Post procedurally she was transferred to the neuro ICU and PCCM was asked to evaluate for ventilator management.  Pertinent  Medical History   has a past medical history of (HFpEF) heart failure with preserved ejection fraction (HCC), Atrial fibrillation (HCC), Autoimmune hepatitis (HCC), CHF  (congestive heart failure) (HCC), Fibrocystic breast disease, Glaucoma, Hypercholesterolemia, Hypertension, Hypothyroidism, Inflammatory arthritis, MI (myocardial infarction) (HCC), Mitral regurgitation, Neuropathy, Non-obstructive Coronary artery disease, Osteoarthritis, Permanent atrial fibrillation (HCC), and PUD (peptic ulcer disease).   Significant Hospital Events: Including procedures, antibiotic start and stop dates in addition to other pertinent events     Interim History / Subjective:    Objective   Blood pressure (!) 144/88, pulse (!) 137, temperature 97.6 F (36.4 C), temperature source Oral, resp. rate 20, height 5\' 3"  (1.6 m), weight 60.7 kg, SpO2 100%.    Vent Mode: PRVC FiO2 (%):  [80 %] 80 % Set Rate:  [16 bmp] 16 bmp Vt Set:  [420 mL] 420 mL Plateau Pressure:  [16 cmH20] 16 cmH20   Intake/Output Summary (Last 24 hours) at 05/14/2023 0125 Last data filed at 05/13/2023 2306 Gross per 24 hour  Intake 1109.98 ml  Output 250 ml  Net 859.98 ml   Filed Weights   05/13/23 1522 05/14/23 0100  Weight: 63.5 kg 60.7 kg    Examination: General: Elderly female on vent in NAD HENT: [place]/AT, PERRL, no JVD Lungs: Clear bilateral breath sounds Cardiovascular: Tachy regular, no MRG Abdomen: Soft, non-distended Extremities: No acute deformity, peripheral pulses intact. No edema.  Neuro: [place] RASS -4 GU: groin excoriation. Stage 1 with DTI on the sacrum/buttocks. POA  Resolved Hospital Problem list     Assessment & Plan:   Acute CVA to the left MCA territory: 50mL penumbra noted on CTA and she is s/p IR revascularization 3/5 - Management per stroke service and IR - Supportive care on vent - Keep SBP 120-12mmHg  Post op  vent need - Full vent support - Propofol and fentanyl for RASS goal -1 to -2 - VAP bundle - QUA/SBT in AM - CXR/ABG pending  Atrial flutter: permanent x 5 years. Frequent RVR episodes per cardiology note. Has presumably been off meds the past few  days.  - Tele monitoring - dilt drip - amio drip - scheduled IV metoprolol, which she is due for a dose of now (rate 130) - Heparin infusion if OK with stroke service, IR - Likely failed xarelto - Appreciate cardiology  Chronic HFpEF-BNP is at about what seems to be her baseline.  CAD - nonocclusive disease at last check.  MR - no evidence of exacerbation or cardiac ischemia at this time - Supportive care.   Rhabdomyolysis: CK > 1000, fortunately renal function seems to be at baseline CKD 3b NAG acidosis - IV fluid with care given her cardiac history. Giving 2nd liter now and starting LR at 125/hr - Trend BMP and CK - hold off bicarb for now, hopefully will correct.   Sepsis secondary to UTI: nitrite positive - continue ceftriaxone  Autoimmune hepatitis: LFT mild elevation, ammonia 17. INR 1.2 - Supportive care  Hypothyroid: TSH elevated, but T4 wnl.  - Resume synthroid when taking PO   Best Practice (right click and "Reselect all SmartList Selections" daily)   Diet/type: NPO DVT prophylaxis SCD Pressure ulcer(s): present on admission  GI prophylaxis: H2B Lines: N/A Foley:  Yes, and it is still needed Code Status:  full code Last date of multidisciplinary goals of care discussion [ ]   Labs   CBC: Recent Labs  Lab 05/13/23 1518 05/13/23 1526 05/13/23 1857  WBC 14.0*  --   --   HGB 15.6* 16.7* 15.3*  HCT 49.2* 49.0* 45.0  MCV 98.0  --   --   PLT 206  --   --     Basic Metabolic Panel: Recent Labs  Lab 05/13/23 1518 05/13/23 1526 05/13/23 1857  NA 141 143 144  K 4.1 4.1 3.8  CL 109 113*  --   CO2 17*  --   --   GLUCOSE 138* 135*  --   BUN 29* 31*  --   CREATININE 1.24* 1.10*  --   CALCIUM 9.5  --   --   MG 2.0  --   --    GFR: Estimated Creatinine Clearance: 27 mL/min (A) (by C-G formula based on SCr of 1.1 mg/dL (H)). Recent Labs  Lab 05/13/23 1518 05/13/23 1526  WBC 14.0*  --   LATICACIDVEN  --  3.2*    Liver Function Tests: Recent  Labs  Lab 05/13/23 1518  AST 74*  ALT 50*  ALKPHOS 98  BILITOT 1.3*  PROT 6.8  ALBUMIN 3.8   No results for input(s): "LIPASE", "AMYLASE" in the last 168 hours. Recent Labs  Lab 05/13/23 1835  AMMONIA 17    ABG    Component Value Date/Time   HCO3 16.9 (L) 05/13/2023 1857   TCO2 18 (L) 05/13/2023 1857   ACIDBASEDEF 6.0 (H) 05/13/2023 1857   O2SAT 73 05/13/2023 1857     Coagulation Profile: Recent Labs  Lab 05/13/23 1518  INR 1.2    Cardiac Enzymes: Recent Labs  Lab 05/13/23 1518  CKTOTAL 1,135*    HbA1C: Hgb A1c MFr Bld  Date/Time Value Ref Range Status  01/27/2018 10:09 AM 5.6 4.6 - 6.5 % Final    Comment:    Glycemic Control Guidelines for People with Diabetes:Non Diabetic:  <6%Goal of  Therapy: <7%Additional Action Suggested:  >8%   05/27/2017 09:59 AM 5.7 4.6 - 6.5 % Final    Comment:    Glycemic Control Guidelines for People with Diabetes:Non Diabetic:  <6%Goal of Therapy: <7%Additional Action Suggested:  >8%     CBG: Recent Labs  Lab 05/13/23 1517  GLUCAP 150*    Review of Systems:   Patient is encephalopathic and/or intubated; therefore, history has been obtained from chart review.    Past Medical History:  She,  has a past medical history of (HFpEF) heart failure with preserved ejection fraction (HCC), Atrial fibrillation (HCC), Autoimmune hepatitis (HCC), CHF (congestive heart failure) (HCC), Fibrocystic breast disease, Glaucoma, Hypercholesterolemia, Hypertension, Hypothyroidism, Inflammatory arthritis, MI (myocardial infarction) (HCC), Mitral regurgitation, Neuropathy, Non-obstructive Coronary artery disease, Osteoarthritis, Permanent atrial fibrillation (HCC), and PUD (peptic ulcer disease).   Surgical History:   Past Surgical History:  Procedure Laterality Date   ABDOMINAL HYSTERECTOMY  1963   partial, secondary to fibroids   APPENDECTOMY     [place]     CARDIAC CATHETERIZATION  05/12/14   [facility]   CARDIOVERSION N/A 12/26/2016    Procedure: CARDIOVERSION;  Surgeon: Iran Ouch, MD;  Location: [facility] ORS;  Service: Cardiovascular;  Laterality: N/A;   CARDIOVERSION N/A 05/16/2017   Procedure: CARDIOVERSION;  Surgeon: Iran Ouch, MD;  Location: [facility] ORS;  Service: Cardiovascular;  Laterality: N/A;   CARDIOVERSION N/A 06/09/2017   Procedure: CARDIOVERSION;  Surgeon: Iran Ouch, MD;  Location: [facility] ORS;  Service: Cardiovascular;  Laterality: N/A;   CARDIOVERSION N/A 11/30/2018   Procedure: CARDIOVERSION;  Surgeon: Iran Ouch, MD;  Location: [facility] ORS;  Service: Cardiovascular;  Laterality: N/A;   CHOLECYSTECTOMY  1998   COLONOSCOPY  2009   PACEMAKER IMPLANT N/A 09/28/2021   Procedure: PACEMAKER IMPLANT;  Surgeon: Lanier Prude, MD;  Location: [facility] INVASIVE CV LAB;  Service: Cardiovascular;  Laterality: N/A;   RIGHT/LEFT HEART CATH AND CORONARY ANGIOGRAPHY Bilateral 08/21/2017   Procedure: RIGHT/LEFT HEART CATH AND CORONARY ANGIOGRAPHY;  Surgeon: Iran Ouch, MD;  Location: [facility] INVASIVE CV LAB;  Service: Cardiovascular;  Laterality: Bilateral;   TEE WITHOUT CARDIOVERSION N/A 08/25/2017   Procedure: TRANSESOPHAGEAL ECHOCARDIOGRAM (TEE);  Surgeon: Dolores Patty, MD;  Location: [facility] ENDOSCOPY;  Service: Cardiovascular;  Laterality: N/A;   UPPER GI ENDOSCOPY  2004     Social History:   reports that she has never smoked. She has never used smokeless tobacco. She reports that she does not drink alcohol and does not use drugs.   Family History:  Her family history includes Colon cancer in her daughter; Esophageal cancer in her brother; Ovarian cancer in her daughter; Stroke in her mother. There is no history of Breast cancer.   Allergies Allergies  Allergen Reactions   Statins Other (See Comments)    Liver damage    Hydroxychloroquine Hives   Haldol [Haloperidol Lactate] Rash   Librax [Chlordiazepoxide-Clidinium] Rash   Plavix [Clopidogrel Bisulfate] Rash   Ramipril Rash     Home  Medications  Prior to Admission medications   Medication Sig Start Date End Date Taking? Authorizing Provider  DULoxetine (CYMBALTA) 20 MG capsule Take 20 mg by mouth 2 (two) times daily.   Yes [provider]  diltiazem (CARDIZEM CD) 240 MG 24 hr capsule Take 240 mg by mouth daily.    [provider]  dorzolamide (TRUSOPT) 2 % ophthalmic solution Place 1 drop into the left eye 2 (two) times daily.    [provider]  fluticasone (  FLONASE) 50 MCG/ACT nasal spray Place 2 sprays into both nostrils daily as needed for allergies. 08/27/16   [provider]  fluticasone-salmeterol (ADVAIR HFA) 115-21 MCG/ACT inhaler Inhale 2 puffs into the lungs 2 (two) times daily. 01/02/23   Allegra Grana, FNP  gabapentin (NEURONTIN) 300 MG capsule One capsule tid and two capsules q hs. 01/28/20   Dale [place], MD  ipratropium (ATROVENT) 0.03 % nasal spray Place 2 sprays into both nostrils 3 (three) times daily as needed for rhinitis. 04/19/20   [provider]  latanoprost (XALATAN) 0.005 % ophthalmic solution Place 1 drop into both eyes at bedtime.  07/29/18   [provider]  levalbuterol (XOPENEX HFA) 45 MCG/ACT inhaler Inhale 1 puff into the lungs every 6 (six) hours as needed for wheezing or shortness of breath. 12/16/22   Allegra Grana, FNP  levothyroxine (SYNTHROID) 137 MCG tablet TAKE 1 TABLET(137 MCG) BY MOUTH DAILY BEFORE BREAKFAST 03/11/23   Dale [place], MD  metoprolol tartrate (LOPRESSOR) 100 MG tablet TAKE 1 AND 1/2 TABLETS(150 MG) BY MOUTH TWICE DAILY 03/14/23   Iran Ouch, MD  Multiple Vitamin (MULTI-VITAMIN) tablet Take 1 tablet by mouth at bedtime.     [provider]  Probiotic Product (ALIGN) 4 MG CAPS Take one capsule q day 08/10/21   Dale [place], MD  torsemide (DEMADEX) 20 MG tablet Take 1 tablet (20 mg total) by mouth daily. 11/18/22 03/26/23  Laurey Morale, MD  traMADol (ULTRAM) 50 MG tablet Take 1 tablet (50 mg  total) by mouth 2 (two) times daily as needed. 03/17/23   Dale [place], MD  ursodiol (ACTIGALL) 300 MG capsule Take 300-600 mg by mouth See admin instructions. Take 2 capsules (600 mg) daily in the morning   & 1 capsule (300 mg) at night.    [provider]  XARELTO 15 MG TABS tablet TAKE 1 TABLET(15 MG) BY MOUTH DAILY WITH SUPPER 02/04/23   Bensimhon, Bevelyn Buckles, MD     Critical care time: 48 minutes     Joneen Roach, AGACNP-BC [facility]  See Amion for personal pager PCCM on call pager [phone] until 7pm. Please call Elink 7p-7a. [phone]  05/14/2023 1:51 AM

## 2023-05-14 NOTE — TOC CAGE-AID Note (Signed)
 Transition of Care [facility]) - CAGE-AID Screening   Patient Details  Name: Ruth Roth MRN: [id number] Date of Birth: 1928-10-26  [facility] CM/SW Contact:    Mearl Latin, LCSW Phone Number: 05/14/2023, 10:03 AM   Clinical Narrative: Patient currently intubated and unable to participate in screening.    CAGE-AID Screening: Substance Abuse Screening unable to be completed due to: : Patient unable to participate

## 2023-05-14 NOTE — Progress Notes (Signed)
 ETT advanced 2cm per MD order. ETT secured at 24 at the lips.

## 2023-05-14 NOTE — TOC Initial Note (Addendum)
 Transition of Care [facility]) - Initial/Assessment Note    Patient Details  Name: Ruth Roth MRN: [id number] Date of Birth: Mar 04, 1929  Transition of Care [facility]) CM/SW Contact:    Lamonte Sakai, Student-Social Work Phone Number: 05/14/2023, 9:22 AM  Clinical Narrative:                  Pt admitted from home. Pt currently intubated- TOC following for medical progression.  Patient Goals and CMS Choice            Expected Discharge Plan and Services       Living arrangements for the past 2 months: Single Family Home                                      Prior Living Arrangements/Services Living arrangements for the past 2 months: Single Family Home                     Activities of Daily Living      Permission Sought/Granted                  Emotional Assessment   Attitude/Demeanor/Rapport: Intubated (Following Commands or Not Following Commands) Affect (typically observed): Unable to Assess Orientation: :  (Intubated)      Admission diagnosis:  Stroke (cerebrum) (HCC) [I63.9] Sepsis secondary to UTI (HCC) [A41.9, N39.0] Altered mental status, unspecified altered mental status type [R41.82] Cerebrovascular accident (CVA) due to embolism of left anterior cerebral artery (HCC) [I63.422] Middle cerebral artery embolism, left [I66.02] Patient Active Problem List   Diagnosis Date Noted   Middle cerebral artery embolism, left 05/14/2023   Endotracheal tube present 05/14/2023   Atrial flutter (HCC) 05/13/2023   Ventricular tachycardia (HCC) 05/13/2023   Permanent atrial fibrillation s/p pacemaker (HCC) 05/13/2023   Elevated troponin 05/13/2023   Sepsis secondary to UTI (HCC) 05/13/2023   Acute metabolic encephalopathy 05/13/2023   Rhabdomyolysis 05/13/2023   Acute kidney injury superimposed on stage 3b chronic kidney disease (HCC) 05/13/2023   Metabolic acidosis 05/13/2023   History of CAD (coronary artery disease) 05/13/2023   Mitral valvular  insufficiency and aortic valvular insufficiency 05/13/2023   Acute cystitis 05/13/2023   Reactive airway disease 05/13/2023   Stroke (cerebrum) (HCC) 05/13/2023   Fall at home, initial encounter 04/13/2023   Bronchitis 12/19/2022   B12 deficiency 09/23/2022   Dysuria 07/20/2022   Nasal congestion 06/16/2022   Headache 06/13/2022   Chronic liver disease and cirrhosis (HCC) 04/03/2022   Secondary hypercoagulable state (HCC) 01/23/2022   Tachy-brady syndrome (HCC) 12/12/2021   Rib pain on right side 08/23/2021   UTI (urinary tract infection) 08/12/2021   Shingles 07/11/2021   Left knee pain 05/21/2021   Bilateral hand swelling 09/09/2019   Bilateral hand pain 09/09/2019   SOB (shortness of breath) on exertion 07/13/2019   Chronic low back pain (Primary Area of Pain) (Bilateral) (L>R) w/o sciatica 03/17/2019   Chronic hip pain (Secondary area of Pain) (Bilateral) (R>L) 03/17/2019   Chronic lower extremity pain (Bilateral) 03/17/2019   Chronic anticoagulation (Xarelto) 03/17/2019   Lumbar spinal stenosis (L3-4, L4-5) w/ neurogenic claudication 03/17/2019   Abnormal MRI, lumbar spine 03/17/2019   Lumbar foraminal stenosis (L5-S1) (Bilateral) 03/17/2019   Lumbar facet hypertrophy 03/17/2019   Lumbar facet syndrome (Bilateral) 03/17/2019   Chronic pain syndrome 03/16/2019   Pharmacologic therapy 03/16/2019   Disorder of skeletal system 03/16/2019  Problems influencing health status 03/16/2019   Sinusitis 02/20/2019   CKD (chronic kidney disease) stage 3, GFR 30-59 ml/min (HCC) 07/26/2018   Cough 05/24/2018   Primary biliary cholangitis (HCC) 05/24/2018   Iron deficiency anemia 02/25/2018   Persistent atrial fibrillation (HCC) 11/12/2017   Severe mitral regurgitation 11/12/2017   Pulmonary hypertension, unspecified (HCC) 11/12/2017   Peripheral neuropathy 11/12/2017   Itching 10/05/2017   Acute diastolic CHF (congestive heart failure) (HCC) 08/21/2017   Dizziness 08/09/2016    Back pain 05/19/2016   Abnormal CXR 10/02/2014   Benign meningioma (HCC) 09/20/2014   Chronic diastolic CHF (congestive heart failure) (HCC) 05/24/2014   Atherosclerotic heart disease of native coronary artery with other forms of angina pectoris (HCC)    Dyspnea 05/09/2014   Renal insufficiency 05/06/2014   Palpitations 04/17/2014   Health care maintenance 04/17/2014   Anemia due to chronic kidney disease 06/26/2013   Onychomycosis due to dermatophyte 06/26/2013   Exostosis 06/26/2013   Gastritis and gastroduodenitis 11/04/2012   GERD (gastroesophageal reflux disease) 10/18/2012   Autoimmune hepatitis (HCC) 01/20/2012   Osteoporosis 01/20/2012   Benign hypertension 01/16/2012   Hypercholesteremia 01/16/2012   Hyperlipidemia 01/16/2012   Hypothyroidism 04/30/2011   Thyroiditis, lymphocytic 04/30/2011   PCP:  [place], MD Pharmacy:   [facility] [phone] - [place], [place] - [address] AT [facility] [address] [place] [address] [place] [address] Phone: [phone] Fax: [phone]  [id number] - [place], [place] - [address] [place] [address] Phone: [phone] Fax: [phone]  [facility] [phone] - [place], [place] - [address] AT [facility] OF [address] [place] [address] Phone: [phone] Fax: [phone]     Social Drivers of Health (SDOH) Social History: SDOH Screenings   Food Insecurity: No Food Insecurity (06/17/2022)  Housing: Low Risk  (06/17/2022)  Transportation Needs: No Transportation Needs (06/17/2022)  Utilities: Not At Risk (06/17/2022)  Depression (PHQ2-9): Low Risk  (12/19/2022)  Financial Resource Strain: Low Risk  (06/17/2022)  Physical Activity: Insufficiently Active (06/17/2022)  Social Connections: Unknown (06/17/2022)  Stress: No Stress Concern Present (06/17/2022)  Tobacco Use: Low Risk  (05/13/2023)   SDOH Interventions:     Readmission Risk Interventions      No data to display

## 2023-05-14 NOTE — Progress Notes (Signed)
 Lactic acid critical: 2.2.  Notified CCM at 0534.  No new orders.

## 2023-05-14 NOTE — Progress Notes (Signed)
 PT Cancellation Note  Patient Details Name: Ruth Roth MRN: [id number] DOB: 1928/07/18   Cancelled Treatment:    Reason Eval/Treat Not Completed: Patient not medically ready (sedated on vent)  Lillia Pauls, PT, DPT Acute Rehabilitation Services Office [phone]    Norval Morton 05/14/2023, 7:54 AM

## 2023-05-14 NOTE — Progress Notes (Signed)
 NAME:  Ruth Roth, MRN:  [id number], DOB:  May 17, 1928, LOS: 1 ADMISSION DATE:  05/13/2023, CONSULTATION DATE: 3/5 REFERRING MD: Dr. Otelia Limes, CHIEF COMPLAINT: Stroke  History of Present Illness:  Patient is encephalopathic and/or intubated; therefore, history has been obtained from chart review.  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  The patient was last known well 3/2.  When she missed a doctor's appointment on 3/4, with a check on her and had to break down the door to access the home.  She was found to be lying on the ground with multiple bruises.  She was transported to the ED as a level 2 trauma.  Upon arrival to the emergency department she was found to have rapid atrial flutter as well as evidence of UTI on labs.  Other laboratory evaluation significant for CK of 1135.  Trauma workup was unremarkable.  Cardiology was consulted for atrial fibrillation/flutter.  She was started on diltiazem infusion as well as IV amiodarone and beta-blockers.  Recommended IV heparin if safe from a trauma/neuro perspective.  There is also concern for stroke potentially provoking the fall and neurology was consulted.  CT angiogram of the head demonstrated occluded versus severely stenotic distal left M1 MCA with 50 mL of left MCA penumbra.  Options were considered and ultimately the patient was taken to interventional radiology for mechanical thrombectomy.  ATICI 3 revascularization result was achieved.  Post procedurally she was transferred to the neuro ICU and PCCM was asked to evaluate for ventilator management.  Pertinent  Medical History   has a past medical history of (HFpEF) heart failure with preserved ejection fraction (HCC), Atrial fibrillation (HCC), Autoimmune hepatitis (HCC), CHF  (congestive heart failure) (HCC), Fibrocystic breast disease, Glaucoma, Hypercholesterolemia, Hypertension, Hypothyroidism, Inflammatory arthritis, MI (myocardial infarction) (HCC), Mitral regurgitation, Neuropathy, Non-obstructive Coronary artery disease, Osteoarthritis, Permanent atrial fibrillation (HCC), and PUD (peptic ulcer disease).  Significant Hospital Events: Including procedures, antibiotic start and stop dates in addition to other pertinent events   3/4 Presented after being found down, admission CTA head with occluded vs severely stenotic distal left M1 MCA with 50 mL of left MCA penumbra.  Options were considered and ultimately the patient was taken to IR for mechanical thrombectomy. 3/5 Remains sedated on vent, no issues overnight   Interim History / Subjective:  Sedated on vent, withdrawal to pain   Objective   Blood pressure (!) 121/54, pulse 66, temperature (!) 96.5 F (35.8 C), temperature source Axillary, resp. rate 18, height 5\' 3"  (1.6 m), weight 62.2 kg, SpO2 100%.    Vent Mode: PRVC FiO2 (%):  [30 %-80 %] 30 % Set Rate:  [16 bmp] 16 bmp Vt Set:  [420 mL] 420 mL Plateau Pressure:  [16 cmH20] 16 cmH20   Intake/Output Summary (Last 24 hours) at 05/14/2023 0701 Last data filed at 05/14/2023 6160 Gross per 24 hour  Intake 2936.81 ml  Output 820 ml  Net 2116.81 ml   Filed Weights   05/13/23 1522 05/14/23 0100 05/14/23 0128  Weight: 63.5 kg 60.7 kg 62.2 kg    Examination: General: Acute on chronically ill appearing elderly female lying in bed on mechanical ventilation, in NAD HEENT: ETT, MM pink/moist, PERRL,  Neuro: Sedated on vent, withdrawals from pain  CV: s1s2 regular rate and rhythm, no murmur, rubs, or gallops,  PULM:  Clear to auscultation, no increased work of breathing, tolerating vent  GI: soft, bowel sounds active in all 4 quadrants, non-tender, non-distended,  Extremities: warm/dry, no edema  Skin: no rashes or lesions  Resolved Hospital Problem  list   NAG acidosis  Assessment & Plan:   Acute CVA to the left MCA territory  -50mL penumbra noted on CTA and she is s/p IR revascularization 3/5 P: Primary management per neurology/neuro IR Maintain neuro protective measures; goal for eurothermia, euglycemia, eunatermia, normoxia, and PCO2 goal of 35-40 Nutrition and bowel regiment  Aspirations precautions  Secondary stroke prevention  SBP goal 120-140  Post op vent need P: Mentation precludes extubation currently, wean sedation  Continue ventilator support with lung protective strategies  Wean PEEP and FiO2 for sats greater than 90%. Head of bed elevated 30 degrees. Plateau pressures less than 30 cm H20.  Follow intermittent chest x-ray and ABG.   SAT/SBT as tolerated, mentation preclude extubation  Ensure adequate pulmonary hygiene  Follow cultures  VAP bundle in place  PAD protocol  Permanent Atrial flutter -Present x 5 years. Frequent RVR episodes per cardiology note. Has presumably been off meds the past few days. On Xarelto at baseline ? Failure vs missed dosing  P: Cardiology following appreciate assistance  Remains on both Amio and Cardizem drips Scheduled IV Lopressor  Continuous telemetry  Strict intake and output  Daily weight to assess volume status Closely monitor renal function and electrolytes  Heparin drip once cleared by Neuro   Chronic HFpEF -BNP is at about what seems to be her baseline.  -no evidence of exacerbation or cardiac ischemia at this time CAD  -Nonocclusive disease at last check.  MR P: Supportive care  Strict intake and output   Rhabdomyolysis - Improved  -CK > 1000, fortunately renal function seems to be at baseline CKD 3a -Baseline creatinine 1.1-1.3, creatinine 1.24 on admit  P: Follow renal function  Monitor urine output Trend Bmet Avoid nephrotoxins Ensure adequate renal perfusion   Sepsis secondary to UTI -Nitrite positive P: Remains on ceftriaxone, stop date in  place   Primary biliary hepatitis -LFT mild elevation, ammonia 17. INR 1.2 P: Supportive care  Avoid hepatotoxins  Outpatient follow up  Hypothyroid -TSH elevated, but T4 wnl.  P: Resume home synthroid when taking PO   Best Practice (right click and "Reselect all SmartList Selections" daily)   Diet/type: NPO DVT prophylaxis SCD Pressure ulcer(s): present on admission  GI prophylaxis: H2B Lines: N/A Foley:  Yes, and it is still needed Code Status:  full code Last date of multidisciplinary goals of care discussion: Pending, no family at bedside am 3/5   Critical care time:   CRITICAL CARE Performed by: Debara Kamphuis D. Harris   Total critical care time: 38 minutes  Critical care time was exclusive of separately billable procedures and treating other patients.  Critical care was necessary to treat or prevent imminent or life-threatening deterioration.  Critical care was time spent personally by me on the following activities: development of treatment plan with patient and/or surrogate as well as nursing, discussions with consultants, evaluation of patient's response to treatment, examination of patient, obtaining history from patient or surrogate, ordering and performing treatments and interventions, ordering and review of laboratory studies, ordering and review of radiographic studies, pulse oximetry and re-evaluation of patient's condition.  Ranard Harte D. Harris, NP-C [facility] Personal contact information can be found on Amion  If no contact or response made please call [phone] 05/14/2023, 7:13  AM

## 2023-05-14 NOTE — Progress Notes (Signed)
 STROKE TEAM PROGRESS NOTE   SUBJECTIVE (INTERVAL HISTORY) Her RN is at the bedside.  Pt is still intubated on sedation. Earlier today reported follow some simple command on voice when briefly off sedation. Plan to extubate today. Pending MRI.    OBJECTIVE Temp:  [96.5 F (35.8 C)-97.6 F (36.4 C)] 97.1 F (36.2 C) (03/05 0800) Pulse Rate:  [53-147] 124 (03/05 1100) Cardiac Rhythm: Atrial fibrillation (03/05 0051) Resp:  [12-35] 18 (03/05 1100) BP: (113-158)/(52-106) 141/94 (03/05 1100) SpO2:  [93 %-100 %] 100 % (03/05 1100) FiO2 (%):  [30 %-80 %] 30 % (03/05 0818) Weight:  [60.7 kg-63.5 kg] 62.2 kg (03/05 0128)  Recent Labs  Lab 05/13/23 1517 05/14/23 0350 05/14/23 0814  GLUCAP 150* 128* 102*   Recent Labs  Lab 05/13/23 1518 05/13/23 1526 05/13/23 1857 05/14/23 0155 05/14/23 0455  NA 141 143 144 140 142  K 4.1 4.1 3.8 3.3* 3.7  CL 109 113*  --   --  108  CO2 17*  --   --   --  19*  GLUCOSE 138* 135*  --   --  145*  BUN 29* 31*  --   --  31*  CREATININE 1.24* 1.10*  --   --  1.19*  CALCIUM 9.5  --   --   --  9.0  MG 2.0  --   --   --   --    Recent Labs  Lab 05/13/23 1518 05/14/23 0455  AST 74* 43*  ALT 50* 41  ALKPHOS 98 79  BILITOT 1.3* 0.7  PROT 6.8 5.8*  ALBUMIN 3.8 3.2*   Recent Labs  Lab 05/13/23 1518 05/13/23 1526 05/13/23 1857 05/14/23 0155 05/14/23 0455  WBC 14.0*  --   --   --  10.6*  NEUTROABS  --   --   --   --  8.9*  HGB 15.6* 16.7* 15.3* 13.6 13.1  HCT 49.2* 49.0* 45.0 40.0 41.4  MCV 98.0  --   --   --  97.6  PLT 206  --   --   --  170   Recent Labs  Lab 05/13/23 1518 05/14/23 0455  CKTOTAL 1,135* 472*   Recent Labs    05/13/23 1518 05/14/23 0455  LABPROT 14.9 14.9  INR 1.2 1.2   Recent Labs    05/13/23 1900  COLORURINE YELLOW  LABSPEC 1.030  PHURINE 5.0  GLUCOSEU NEGATIVE  HGBUR MODERATE*  BILIRUBINUR NEGATIVE  KETONESUR 20*  PROTEINUR >=300*  NITRITE POSITIVE*  LEUKOCYTESUR LARGE*       Component Value  Date/Time   CHOL 172 05/14/2023 0455   TRIG 189 (H) 05/14/2023 0455   HDL 67 05/14/2023 0455   CHOLHDL 2.6 05/14/2023 0455   VLDL 38 05/14/2023 0455   LDLCALC 67 05/14/2023 0455   Lab Results  Component Value Date   HGBA1C 5.4 05/14/2023   No results found for: "LABOPIA", "COCAINSCRNUR", "LABBENZ", "AMPHETMU", "THCU", "LABBARB"  Recent Labs  Lab 05/13/23 1518  ETH <10    I have personally reviewed the radiological images below and agree with the radiology interpretations.  DG Abd Portable 1V Result Date: 05/14/2023 CLINICAL DATA:  Check gastric catheter placement EXAM: PORTABLE ABDOMEN - 1 VIEW COMPARISON:  None Available. FINDINGS: Gastric catheter is noted within the stomach. Contrast is noted within the kidneys bilaterally. No free air is seen. IMPRESSION: Gastric catheter in the stomach. Electronically Signed   By: Alcide Clever M.D.   On: 05/14/2023 02:54   Portable  Chest x-ray Result Date: 05/14/2023 CLINICAL DATA:  Status post intubation EXAM: PORTABLE CHEST 1 VIEW COMPARISON:  Film from the previous day. FINDINGS: Cardiac shadow is within normal limits. Pacing device is again seen. Endotracheal tube and gastric catheter are noted in satisfactory position. Lungs are free of acute infiltrate or effusion. IMPRESSION: No active disease. Electronically Signed   By: Alcide Clever M.D.   On: 05/14/2023 02:53   CT ANGIO HEAD NECK W WO CM W PERF Result Date: 05/13/2023 CLINICAL DATA:  Neuro deficit, acute, stroke suspected EXAM: CT ANGIOGRAPHY HEAD AND NECK CT PERFUSION BRAIN TECHNIQUE: Multidetector CT imaging of the head and neck was performed using the standard protocol during bolus administration of intravenous contrast. Multiplanar CT image reconstructions and MIPs were obtained to evaluate the vascular anatomy. Carotid stenosis measurements (when applicable) are obtained utilizing NASCET criteria, using the distal internal carotid diameter as the denominator. Multiphase CT imaging of the  brain was performed following IV bolus contrast injection. Subsequent parametric perfusion maps were calculated using RAPID software. RADIATION DOSE REDUCTION: This exam was performed according to the departmental dose-optimization program which includes automated exposure control, adjustment of the mA and/or kV according to patient size and/or use of iterative reconstruction technique. CONTRAST:  OMNIPAQUE IOHEXOL 350 MG/ML SOLN COMPARISON:  Same day CT head. FINDINGS: CTA NECK FINDINGS Aortic arch: Atherosclerosis.  Great vessel origins are patent. Right carotid system: Atherosclerosis at the carotid bifurcation without greater than 50% stenosis. Also, atherosclerosis at the skull base with approximately 60% stenosis. Left carotid system: Atherosclerosis without greater than 50% stenosis. Vertebral arteries: Left dominant. No significant (greater than 50%) stenosis. Skeleton: No acute abnormality on limited assessment. Other neck: No acute abnormality on limited assessment. Upper chest: Visualized lung apices are clear. Review of the MIP images confirms the above findings CTA HEAD FINDINGS Anterior circulation: Bilateral intracranial ICAs are patent without significant stenosis. Right MCA and bilateral ACAs are patent without proximal hemodynamically significant stenosis. Distal left M1 MCA occlusion versus severe stenosis. Asymmetric opacification of the left MCA vessels more distally. Posterior circulation: Bilateral intradural vertebral arteries, basilar artery and bilateral posterior cerebral arteries are patent without proximal hemodynamically significant stenosis. Venous sinuses: As permitted by contrast timing, patent. Review of the MIP images confirms the above findings CT Brain Perfusion Findings: CBF (<30%) Volume: 0mL Perfusion (Tmax>6.0s) volume: 50mL Mismatch Volume: 50mL Infarction Location:None identified. IMPRESSION: 1. Occluded versus severely stenotic distal left M1 MCA with asymmetrically  diminished distal left MCA opacification. Recommend code stroke activation and emergent neurology consultation. 2. Approximately 50 mL of left MCA penumbra without evidence of core infarct by CT perfusion. 3. Approximately 60% stenosis of the right ICA at the skull base. Findings and recommendations discussed with Dr. Wilkie Aye via telephone at Dr. Wilkie Aye. Electronically Signed   By: Feliberto Harts M.D.   On: 05/13/2023 21:40   DG Shoulder Right Portable Result Date: 05/13/2023 CLINICAL DATA:  Status post trauma. EXAM: RIGHT SHOULDER - 1 VIEW COMPARISON:  None Available. FINDINGS: Limited study secondary to numerous overlying radiopaque cardiac lead wires. There is no evidence of acute fracture or dislocation. Chronic changes are seen along the greater tubercle and inferior medial aspect of the right humeral. There are moderate severity degenerative changes. Soft tissues are unremarkable. IMPRESSION: 1. Limited study without evidence of acute fracture or dislocation. 2. Moderate severity chronic and degenerative changes. Electronically Signed   By: Aram Candela M.D.   On: 05/13/2023 17:46   DG Pelvis Portable Result Date: 05/13/2023  CLINICAL DATA:  Status post trauma. EXAM: PORTABLE PELVIS 1-2 VIEWS COMPARISON:  March 05, 2016 FINDINGS: There is no evidence of pelvic fracture or diastasis. No pelvic bone lesions are seen. IMPRESSION: Negative. Electronically Signed   By: Aram Candela M.D.   On: 05/13/2023 17:45   DG Chest Port 1 View Result Date: 05/13/2023 CLINICAL DATA:  Status post trauma. EXAM: PORTABLE CHEST 1 VIEW COMPARISON:  December 17, 2022 FINDINGS: There is stable dual lead AICD positioning. The heart size and mediastinal contours are within normal limits. Marked severity calcification of the thoracic aorta is noted. There is no evidence of an acute infiltrate, pleural effusion or pneumothorax. Radiopaque surgical clips are seen overlying the esophageal hiatus. There is mild  dextroscoliosis of the mid and lower thoracic spine with multilevel degenerative changes. IMPRESSION: No active cardiopulmonary disease. Electronically Signed   By: Aram Candela M.D.   On: 05/13/2023 17:44   CT CHEST ABDOMEN PELVIS W CONTRAST Result Date: 05/13/2023 CLINICAL DATA:  Found unresponsive, unknown down time EXAM: CT CHEST, ABDOMEN, AND PELVIS WITH CONTRAST CT THORACIC AND LUMBAR SPINE WITH CONTRAST TECHNIQUE: Multidetector CT imaging of the chest, abdomen and pelvis was performed following the standard protocol during bolus administration of intravenous contrast. Multidetector CT imaging of the thoracic and lumbar spine was performed following the standard protocol during bolus administration of intravenous contrast. RADIATION DOSE REDUCTION: This exam was performed according to the departmental dose-optimization program which includes automated exposure control, adjustment of the mA and/or kV according to patient size and/or use of iterative reconstruction technique. CONTRAST:  75mL OMNIPAQUE IOHEXOL 350 MG/ML SOLN COMPARISON:  CT abdomen pelvis, 04/22/2014 FINDINGS: CT CHEST FINDINGS Cardiovascular: Left chest multi lead pacer. Aortic atherosclerosis. Normal heart size. Scattered left and right coronary artery calcifications. No pericardial effusion. Mediastinum/Nodes: No enlarged mediastinal, hilar, or axillary lymph nodes. Thyroid gland, trachea, and esophagus demonstrate no significant findings. Lungs/Pleura: Lungs are clear. No pleural effusion or pneumothorax. Musculoskeletal: No chest wall mass or suspicious osseous lesions identified. CT ABDOMEN PELVIS FINDINGS Hepatobiliary: No focal liver abnormality is seen. Status post cholecystectomy. Similar severe postoperative biliary ductal dilatation. Pancreas: Unremarkable. No pancreatic ductal dilatation or surrounding inflammatory changes. Spleen: Normal in size without significant abnormality. Adrenals/Urinary Tract: Adrenal glands are  unremarkable. Kidneys are normal, without renal calculi, solid lesion, or hydronephrosis. Bladder is unremarkable. Stomach/Bowel: Stomach is within normal limits. Appendix not clearly visualized. No evidence of bowel wall thickening, distention, or inflammatory changes. Vascular/Lymphatic: Aortic atherosclerosis. 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 No enlarged abdominal or pelvic lymph nodes. Reproductive: No mass or other abnormality. Other: No abdominal wall hernia or abnormality. No ascites. Musculoskeletal: No acute osseous findings. CT THORACIC AND LUMBAR SPINE FINDINGS Alignment: Normal thoracic kyphosis. Normal lumbar lordosis. Vertebral bodies: No acute fracture or dislocation. Unchanged mild superior endplate wedge deformity of L2, with approximately 20% height loss. Disc spaces: Generally mild multilevel disc space height loss and osteophytosis throughout the thoracic and lumbar spine, focally moderate at L3-L4. Paraspinous soft tissues: Unremarkable. IMPRESSION: 1. No CT evidence of acute traumatic injury to the chest, abdomen, or pelvis. 2. No acute fracture or dislocation of the thoracic or lumbar spine. Unchanged mild, chronic wedge deformity of L2. 3. Status post cholecystectomy. Similar severe postoperative biliary ductal dilatation. 4. Coronary artery disease. Aortic Atherosclerosis (ICD10-I70.0). Electronically Signed   By: Jearld Lesch M.D.   On: 05/13/2023 17:28   CT L-SPINE NO CHARGE Result Date: 05/13/2023  CLINICAL DATA:  Found unresponsive, unknown down time EXAM: CT CHEST, ABDOMEN, AND PELVIS WITH CONTRAST CT THORACIC AND LUMBAR SPINE WITH CONTRAST TECHNIQUE: Multidetector CT imaging of the chest, abdomen and pelvis was performed following the standard protocol during bolus administration of intravenous contrast. Multidetector CT imaging of the thoracic and lumbar spine was performed following the standard protocol  during bolus administration of intravenous contrast. RADIATION DOSE REDUCTION: This exam was performed according to the departmental dose-optimization program which includes automated exposure control, adjustment of the mA and/or kV according to patient size and/or use of iterative reconstruction technique. CONTRAST:  75mL OMNIPAQUE IOHEXOL 350 MG/ML SOLN COMPARISON:  CT abdomen pelvis, 04/22/2014 FINDINGS: CT CHEST FINDINGS Cardiovascular: Left chest multi lead pacer. Aortic atherosclerosis. Normal heart size. Scattered left and right coronary artery calcifications. No pericardial effusion. Mediastinum/Nodes: No enlarged mediastinal, hilar, or axillary lymph nodes. Thyroid gland, trachea, and esophagus demonstrate no significant findings. Lungs/Pleura: Lungs are clear. No pleural effusion or pneumothorax. Musculoskeletal: No chest wall mass or suspicious osseous lesions identified. CT ABDOMEN PELVIS FINDINGS Hepatobiliary: No focal liver abnormality is seen. Status post cholecystectomy. Similar severe postoperative biliary ductal dilatation. Pancreas: Unremarkable. No pancreatic ductal dilatation or surrounding inflammatory changes. Spleen: Normal in size without significant abnormality. Adrenals/Urinary Tract: Adrenal glands are unremarkable. Kidneys are normal, without renal calculi, solid lesion, or hydronephrosis. Bladder is unremarkable. Stomach/Bowel: Stomach is within normal limits. Appendix not clearly visualized. No evidence of bowel wall thickening, distention, or inflammatory changes. Vascular/Lymphatic: Aortic atherosclerosis. 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 No enlarged abdominal or pelvic lymph nodes. Reproductive: No mass or other abnormality. Other: No abdominal wall hernia or abnormality. No ascites. Musculoskeletal: No acute osseous findings. CT THORACIC AND LUMBAR SPINE FINDINGS Alignment: Normal thoracic kyphosis.  Normal lumbar lordosis. Vertebral bodies: No acute fracture or dislocation. Unchanged mild superior endplate wedge deformity of L2, with approximately 20% height loss. Disc spaces: Generally mild multilevel disc space height loss and osteophytosis throughout the thoracic and lumbar spine, focally moderate at L3-L4. Paraspinous soft tissues: Unremarkable. IMPRESSION: 1. No CT evidence of acute traumatic injury to the chest, abdomen, or pelvis. 2. No acute fracture or dislocation of the thoracic or lumbar spine. Unchanged mild, chronic wedge deformity of L2. 3. Status post cholecystectomy. Similar severe postoperative biliary ductal dilatation. 4. Coronary artery disease. Aortic Atherosclerosis (ICD10-I70.0). Electronically Signed   By: Jearld Lesch M.D.   On: 05/13/2023 17:28   CT T-SPINE NO CHARGE Result Date: 05/13/2023 CLINICAL DATA:  Found unresponsive, unknown down time EXAM: CT CHEST, ABDOMEN, AND PELVIS WITH CONTRAST CT THORACIC AND LUMBAR SPINE WITH CONTRAST TECHNIQUE: Multidetector CT imaging of the chest, abdomen and pelvis was performed following the standard protocol during bolus administration of intravenous contrast. Multidetector CT imaging of the thoracic and lumbar spine was performed following the standard protocol during bolus administration of intravenous contrast. RADIATION DOSE REDUCTION: This exam was performed according to the departmental dose-optimization program which includes automated exposure control, adjustment of the mA and/or kV according to patient size and/or use of iterative reconstruction technique. CONTRAST:  75mL OMNIPAQUE IOHEXOL 350 MG/ML SOLN COMPARISON:  CT abdomen pelvis, 04/22/2014 FINDINGS: CT CHEST FINDINGS Cardiovascular: Left chest multi lead pacer. Aortic atherosclerosis. Normal heart size. Scattered left and right coronary artery calcifications. No pericardial effusion. Mediastinum/Nodes: No enlarged mediastinal, hilar, or axillary lymph nodes. Thyroid gland,  trachea, and esophagus demonstrate no significant findings. Lungs/Pleura: Lungs are clear. No pleural effusion or pneumothorax.  Musculoskeletal: No chest wall mass or suspicious osseous lesions identified. CT ABDOMEN PELVIS FINDINGS Hepatobiliary: No focal liver abnormality is seen. Status post cholecystectomy. Similar severe postoperative biliary ductal dilatation. Pancreas: Unremarkable. No pancreatic ductal dilatation or surrounding inflammatory changes. Spleen: Normal in size without significant abnormality. Adrenals/Urinary Tract: Adrenal glands are unremarkable. Kidneys are normal, without renal calculi, solid lesion, or hydronephrosis. Bladder is unremarkable. Stomach/Bowel: Stomach is within normal limits. Appendix not clearly visualized. No evidence of bowel wall thickening, distention, or inflammatory changes. Vascular/Lymphatic: Aortic atherosclerosis. 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 No enlarged abdominal or pelvic lymph nodes. Reproductive: No mass or other abnormality. Other: No abdominal wall hernia or abnormality. No ascites. Musculoskeletal: No acute osseous findings. CT THORACIC AND LUMBAR SPINE FINDINGS Alignment: Normal thoracic kyphosis. Normal lumbar lordosis. Vertebral bodies: No acute fracture or dislocation. Unchanged mild superior endplate wedge deformity of L2, with approximately 20% height loss. Disc spaces: Generally mild multilevel disc space height loss and osteophytosis throughout the thoracic and lumbar spine, focally moderate at L3-L4. Paraspinous soft tissues: Unremarkable. IMPRESSION: 1. No CT evidence of acute traumatic injury to the chest, abdomen, or pelvis. 2. No acute fracture or dislocation of the thoracic or lumbar spine. Unchanged mild, chronic wedge deformity of L2. 3. Status post cholecystectomy. Similar severe postoperative biliary ductal dilatation. 4. Coronary artery disease. Aortic  Atherosclerosis (ICD10-I70.0). Electronically Signed   By: Jearld Lesch M.D.   On: 05/13/2023 17:28   CT HEAD WO CONTRAST Result Date: 05/13/2023 CLINICAL DATA:  Found unresponsive, unknown downtime EXAM: CT HEAD WITHOUT CONTRAST CT MAXILLOFACIAL WITHOUT CONTRAST CT CERVICAL SPINE WITHOUT CONTRAST TECHNIQUE: Multidetector CT imaging of the head, cervical spine, and maxillofacial structures were performed using the standard protocol without intravenous contrast. Multiplanar CT image reconstructions of the cervical spine and maxillofacial structures were also generated. RADIATION DOSE REDUCTION: This exam was performed according to the departmental dose-optimization program which includes automated exposure control, adjustment of the mA and/or kV according to patient size and/or use of iterative reconstruction technique. COMPARISON:  04/10/2023 FINDINGS: CT HEAD FINDINGS Brain: No evidence of acute infarction, hemorrhage, hydrocephalus, extra-axial collection or mass lesion/mass effect. Mild periventricular and deep white matter hypodensity. Unchanged left occipital encephalomalacia. Vascular: No hyperdense vessel or unexpected calcification. CT FACIAL BONES FINDINGS Skull: Normal. Negative for fracture or focal lesion. Facial bones: No displaced fractures or dislocations. Sinuses/Orbits: Small air-fluid level in the right maxillary sinus. No overlying fracture. Other: Patient is partially edentulous with erosion of the left maxillary alveolar bone into the left maxillary sinus (series 7, image 31). CT CERVICAL SPINE FINDINGS Alignment: Normal. Skull base and vertebrae: No acute fracture. No primary bone lesion or focal pathologic process. Soft tissues and spinal canal: No prevertebral fluid or swelling. No visible canal hematoma. Disc levels: Focally moderate disc space height loss and osteophytosis of the lower cervical levels C5-C7 Upper chest: Negative. Other: None. IMPRESSION: 1. No acute intracranial  pathology. Small-vessel white matter disease and unchanged left occipital encephalomalacia. 2. No displaced fractures or dislocations of the facial bones. 3. Small air-fluid level in the right maxillary sinus. No overlying fracture. Patient is partially edentulous with erosion of the left maxillary alveolar bone into the left maxillary sinus. Correlate for sinusitis. 4. No fracture or static subluxation of the cervical spine. 5. Focally moderate disc space height loss and osteophytosis of the lower cervical levels. Electronically Signed   By: Jearld Lesch M.D.   On: 05/13/2023 17:20  CT CERVICAL SPINE WO CONTRAST Result Date: 05/13/2023 CLINICAL DATA:  Found unresponsive, unknown downtime EXAM: CT HEAD WITHOUT CONTRAST CT MAXILLOFACIAL WITHOUT CONTRAST CT CERVICAL SPINE WITHOUT CONTRAST TECHNIQUE: Multidetector CT imaging of the head, cervical spine, and maxillofacial structures were performed using the standard protocol without intravenous contrast. Multiplanar CT image reconstructions of the cervical spine and maxillofacial structures were also generated. RADIATION DOSE REDUCTION: This exam was performed according to the departmental dose-optimization program which includes automated exposure control, adjustment of the mA and/or kV according to patient size and/or use of iterative reconstruction technique. COMPARISON:  04/10/2023 FINDINGS: CT HEAD FINDINGS Brain: No evidence of acute infarction, hemorrhage, hydrocephalus, extra-axial collection or mass lesion/mass effect. Mild periventricular and deep white matter hypodensity. Unchanged left occipital encephalomalacia. Vascular: No hyperdense vessel or unexpected calcification. CT FACIAL BONES FINDINGS Skull: Normal. Negative for fracture or focal lesion. Facial bones: No displaced fractures or dislocations. Sinuses/Orbits: Small air-fluid level in the right maxillary sinus. No overlying fracture. Other: Patient is partially edentulous with erosion of the left  maxillary alveolar bone into the left maxillary sinus (series 7, image 31). CT CERVICAL SPINE FINDINGS Alignment: Normal. Skull base and vertebrae: No acute fracture. No primary bone lesion or focal pathologic process. Soft tissues and spinal canal: No prevertebral fluid or swelling. No visible canal hematoma. Disc levels: Focally moderate disc space height loss and osteophytosis of the lower cervical levels C5-C7 Upper chest: Negative. Other: None. IMPRESSION: 1. No acute intracranial pathology. Small-vessel white matter disease and unchanged left occipital encephalomalacia. 2. No displaced fractures or dislocations of the facial bones. 3. Small air-fluid level in the right maxillary sinus. No overlying fracture. Patient is partially edentulous with erosion of the left maxillary alveolar bone into the left maxillary sinus. Correlate for sinusitis. 4. No fracture or static subluxation of the cervical spine. 5. Focally moderate disc space height loss and osteophytosis of the lower cervical levels. Electronically Signed   By: Jearld Lesch M.D.   On: 05/13/2023 17:20   CT MAXILLOFACIAL WO CONTRAST Result Date: 05/13/2023 CLINICAL DATA:  Found unresponsive, unknown downtime EXAM: CT HEAD WITHOUT CONTRAST CT MAXILLOFACIAL WITHOUT CONTRAST CT CERVICAL SPINE WITHOUT CONTRAST TECHNIQUE: Multidetector CT imaging of the head, cervical spine, and maxillofacial structures were performed using the standard protocol without intravenous contrast. Multiplanar CT image reconstructions of the cervical spine and maxillofacial structures were also generated. RADIATION DOSE REDUCTION: This exam was performed according to the departmental dose-optimization program which includes automated exposure control, adjustment of the mA and/or kV according to patient size and/or use of iterative reconstruction technique. COMPARISON:  04/10/2023 FINDINGS: CT HEAD FINDINGS Brain: No evidence of acute infarction, hemorrhage, hydrocephalus,  extra-axial collection or mass lesion/mass effect. Mild periventricular and deep white matter hypodensity. Unchanged left occipital encephalomalacia. Vascular: No hyperdense vessel or unexpected calcification. CT FACIAL BONES FINDINGS Skull: Normal. Negative for fracture or focal lesion. Facial bones: No displaced fractures or dislocations. Sinuses/Orbits: Small air-fluid level in the right maxillary sinus. No overlying fracture. Other: Patient is partially edentulous with erosion of the left maxillary alveolar bone into the left maxillary sinus (series 7, image 31). CT CERVICAL SPINE FINDINGS Alignment: Normal. Skull base and vertebrae: No acute fracture. No primary bone lesion or focal pathologic process. Soft tissues and spinal canal: No prevertebral fluid or swelling. No visible canal hematoma. Disc levels: Focally moderate disc space height loss and osteophytosis of the lower cervical levels C5-C7 Upper chest: Negative. Other: None. IMPRESSION: 1. No acute intracranial pathology. Small-vessel white matter disease  and unchanged left occipital encephalomalacia. 2. No displaced fractures or dislocations of the facial bones. 3. Small air-fluid level in the right maxillary sinus. No overlying fracture. Patient is partially edentulous with erosion of the left maxillary alveolar bone into the left maxillary sinus. Correlate for sinusitis. 4. No fracture or static subluxation of the cervical spine. 5. Focally moderate disc space height loss and osteophytosis of the lower cervical levels. Electronically Signed   By: Jearld Lesch M.D.   On: 05/13/2023 17:20     PHYSICAL EXAM  Temp:  [96.5 F (35.8 C)-97.6 F (36.4 C)] 97.1 F (36.2 C) (03/05 0800) Pulse Rate:  [53-147] 124 (03/05 1100) Resp:  [12-35] 18 (03/05 1100) BP: (113-158)/(52-106) 141/94 (03/05 1100) SpO2:  [93 %-100 %] 100 % (03/05 1100) FiO2 (%):  [30 %-80 %] 30 % (03/05 0818) Weight:  [60.7 kg-63.5 kg] 62.2 kg (03/05 0128)  General - Well  nourished, well developed, intubated on sedation.  Ophthalmologic - fundi not visualized due to noncooperation.  Cardiovascular - irregularly irregular heart rate and rhythm.  Neuro - intubated on sedation, eyes closed, not following commands. With forced eye opening, eyes in mid position, not blinking to visual threat, doll's eyes sluggish, not tracking, PERRL. Corneal reflex weak bilaterally, gag and cough present. Breathing over the vent.  Facial symmetry not able to test due to ET tube.  Tongue protrusion not cooperative. On pain stimulation, flaccid on the RUE but strong withdraw RLE, LUE and LLE. Sensation, coordination and gait not tested.     ASSESSMENT/PLAN [name] is a 89 y.o. female with history of HFpEF, pacemaker, A-fib on Xarelto, HLD, HTN, CAD, PVD admitted for right side weakness, aphasia, found down at home. No TNK given due to on Xarelto.    Stroke:  left MCA scattered infarcts embolic secondary to A-fib even on Xarelto CT no acute abnormality, left occipital encephalomalacia CT head and neck left M1 occlusion.  60% stenosis left ICA at the skull base CTP 0/50 cc Status post IR left M1 occlusion with TICI3 MRI multifocal acute infarcts throughout left MCA territory MRA distal left M1 MCA stenosis versus occlusion now patent 2D Echo pending LDL 67 HgbA1c 5.4 Lovenox for VTE prophylaxis Xarelto (rivaroxaban) daily prior to admission, now on aspirin 300 mg suppository daily.  Consider Eliquis once p.o. access Ongoing aggressive stroke risk factor management Therapy recommendations: Pending Disposition: Pending  A-fib RVR Cardiology on board on diltiazem IV  Now off amiodarone IV On metoprolol IV 2.5 mg every 6 hours Rate controlled  Respiratory failure Intubated for procedure Remain intubated postprocedure Extubated 05/14/2023 Currently tolerating well  Hypertension Stable Off Cleviprex BP goal 03/31/1958 for 24 hours post IR, then less than  180/105 Long term BP goal normotensive  Hyperlipidemia Home meds: None LDL 67, goal < 70 Statin intolerance No statin needed given LDL at goal  Other Stroke Risk Factors Advanced age HFpEF CAD  Other Active Problems Pacemaker in place PUD AKI, creatinine 1.24--1.19 Slight leukocytosis, WBC 10.6  Hospital day # 1  This patient is critically ill due to stroke status post thrombectomy, A-fib RVR, respiratory failure and at significant risk of neurological worsening, death form recurrent stroke, hemorrhagic transmission, heart failure, seizure. This patient's care requires constant monitoring of vital signs, hemodynamics, respiratory and cardiac monitoring, review of multiple databases, neurological assessment, discussion with family, other specialists and medical decision making of high complexity. I spent 40 minutes of neurocritical care time in the care of this patient. I  had long discussion with son at bedside, updated pt current condition, treatment plan and potential prognosis, and answered all the questions.  He expressed understanding and appreciation.    Marvel Plan, MD PhD Stroke Neurology 05/14/2023 11:44 AM    To contact Stroke Continuity provider, please refer to [email or web address]. After hours, contact General Neurology

## 2023-05-14 NOTE — Progress Notes (Signed)
 SLP Cancellation Note  Patient Details Name: EVALYNN HANKINS MRN: [id number] DOB: 12/03/1928   Cancelled treatment:       Reason Eval/Treat Not Completed: Patient not medically ready   Demir Titsworth, Riley Nearing 05/14/2023, 8:41 AM

## 2023-05-14 NOTE — Procedures (Signed)
 Extubation Procedure Note  Patient Details:   Name: Ruth Roth DOB: Nov 18, 1928 MRN: [id number]   Airway Documentation:    Vent end date: 05/14/23 Vent end time: 1158   Evaluation  O2 sats: stable throughout Complications: No apparent complications Patient did tolerate procedure well. Bilateral Breath Sounds: Clear, Diminished   Yes  Pt extubated to 4l [place] with RN at bedside. No cuff leak noted; CCM aware and told to proceed. Pt tolerated well and vitals are stable. RT will monitor.  Lajuan Lines 05/14/2023, 11:59 AM

## 2023-05-14 NOTE — Progress Notes (Signed)
 eLink Physician-Brief Progress Note Patient Name: Ruth Roth DOB: [birth year] MRN: [id number]   Date of Service  05/14/2023  HPI/Events of Note  lactic acid 2.2 from 3.2 potassium 3.7, creatinine 1.19, GFR 43  eICU Interventions  Initiate electrolyte replacement protocol     Intervention Category Intermediate Interventions: Electrolyte abnormality - evaluation and management  Darl Pikes 05/14/2023, 6:35 AM

## 2023-05-14 NOTE — Consult Note (Addendum)
 [facility] Medical Consultation  Ruth Roth NWG:[id number] DOB: [birth year] DOA: 05/13/2023 PCP: Dale Bloomingdale, MD   Requesting physician: Neurology Date of consultation: 05/13/2023 Reason for consultation: Management of A-fib RVR, sepsis, AKI, rhabdomyolysis and chronic medical problem.  Impression/Recommendations Principal Problem:   Stroke (cerebrum) (HCC) Active Problems:   Chronic diastolic CHF (congestive heart failure) (HCC)   Fall at home, initial encounter   Atrial flutter (HCC)   Ventricular tachycardia (HCC)   Permanent atrial fibrillation s/p pacemaker (HCC)   Elevated troponin   Sepsis secondary to UTI (HCC)   Acute metabolic encephalopathy   Rhabdomyolysis   Hypothyroidism   Anemia due to chronic kidney disease   Chronic liver disease and cirrhosis (HCC)   Acute kidney injury superimposed on stage 3b chronic kidney disease (HCC)   Metabolic acidosis   History of CAD (coronary artery disease)   Mitral valvular insufficiency and aortic valvular insufficiency   Acute cystitis   Reactive airway disease   Middle cerebral artery embolism, left   Endotracheal tube present  Acute CVA - Patient presenting with a mechanical fall.  Patient has dysarthria and right-sided upper lower extremity weakness. CTA head and neck showed occluded versus severe stenosis left M1 MCA with a symmetrical diminished distal left MCA opacification.  -Code stroke activated in the emergency department by neurology Dr. Otelia Limes and patient was taken to the OR immediately for thrombectomy tonight. - Patient's son is the POA who is at the bedside decided to proceed with surgical intervention.   -Patient will be admitted to neuro ICU by neurology service. Managed by neurology. -If patient remains intubated postoperatively hospitalist will sign off and need to consult ICU for vent management and acute medical problem management.   Sepsis secondary to UTI -Patient is tachycardic, tachypneic,  elevated lactic acid having leukocytosis.  UA showed evidence of UTI.  Meets sepsis criteria. - In the ED patient has been resuscitated with 1 L of LR bolus and ceftriaxone 1 g. -Will check respiratory panel.  Chest x-ray evidence of pneumonia.  UA showed evidence of UTI. -Pending urine culture, blood culture. - Continue IV ceftriaxone. - In the ED patient is given 1 L of LR bolus.  Giving another liter of LR bolus, continue to trend lactic acid, IV antibiotic and maintenance fluid. -Will follow-up with culture results for antibiotic guidance.     Clinical fall at home-downtime 48-hour Acute metabolic encephalopathy-secondary to sepsis and fall -Extensive imaging CT head, CT cervical spine, CT chest/ab/pelvis no evidence of fracture and acute abnormality - Mechanical fall at home possibly has been provoked by CVA.  Neurology has been consulted recommended pending MRI of the brain and CT of the head and neck. -Hold oral anticoagulations.   History of permanent atrial fibrillation-status post pacemaker A-fib RVR/atrial flutter/ventricular tachycardia -In the ED patient found to have A-fib RVR heart rate 142.  Heart rate unable to control with maximum dose of Cardizem drip.  At home patient is on Cardizem to 240 mg daily, Lopressor 150 mg mg twice daily and Xarelto 15 mg daily. - History of permanent atrial fibrillation status post pacemaker. -A-fib RVR/V. tach/atrial flutter in the setting of sepsis and reflex V. tach as patient might have not taken the Cardizem for last 2 days - Maxed on Cardizem not achieving appropriate rate control.  Cardiology has been evaluated patient in the ED. started on IV amiodarone drip and IV Lopressor 5 mg every 8 hours. -Continue Cardizem drip.  Continue amiodarone drip.  Continue IV metoprolol. -  Cardiology has been following the patient for management of atrial fibrillation/flutter/V. tach.     Elevated troponin secondary to demand ischemia - Elevated troponin  in the setting of demand ischemia from A-fib RVR and sepsis. - Continue to trend troponin - Obtaining echocardiogram.   AKI on CKD stage IIIb Metabolic acidosis in the setting of AKI and sepsis -Prerenal AKI in the setting of sepsis and A-fib RVR - Status post 2 L of LR in the ED.  Continue maintenance fluid.   -UA showing evidence of UTI pending urine culture.  Continue monitor renal function and urine output -Continue monitor bicarb level     Rhabdomyolysis-secondary to fall Elevated CK around  1135. -In ED patient has been given total 2 L of LR bolus.  Continue maintenance fluid LR 150 cc/h   Diastolic heart failure with preserved EF -At home patient is on Cardizem and metoprolol.  As well as torsemide. - Holding torsemide as patient had altered mental status.   Reactive airway disease - Continue Dulera and Xopenex as needed   Hypothyroidism -Patient is on levothyroxine 137 mcg daily.  Checking TSH and T4 level before reinitiating IV Synthyroid.   Chief Complaint: Fall, dysarthria, right-sided weakness  HPI:  Ruth Roth is a 89 y.o. 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  Presumptively patient had a fall for 2 days ago.  She has been Means Dr. Celedonio Savage in the morning and patient's granddaughter did not heard from her for 2 days.  Per EMS patient was found lying on the floor on the right side at home with bruising to the right side of the head, shoulder and hip. In the ED level 2 trauma activated and c-collar has been placed. Patient is able to open her eyes periodically and moaning in pain.  She is able to  move both of her legs purposefully.  At this time patient is nonverbal.     At presentation to ED patient found to have A-fib RVR/atrial flutter heart rate 146, hypertensive, tachypneic O2 sat 100% room air. UA showed evidence of UTI. VBG pH 7.4, pCO2 25, low bicarb 16. Normal ammonia level. Initial troponin 125 which has been increased to 143. Elevated lactic acid 2.3. Elevated CK13 1135. Normal mag level. Elevated BNP around 700. Normal pro time INR. Blood alcohol level less than 10. CBC showing leukocytosis, stable H&H and normal platelet count 206. CMP showing evidence of AKI, low bicarb 17, transaminitis elevated total bilirubin 1.3 and GFR 41.   Pending CT angiogram head and neck. Pending MRI of the brain without contrast.   CT thoracic spine, CT L-spine, CT chest abdomen pelvis, CT maxillofacial bones, CT cervical spine, CT head has been obtained. There is no evidence of acute traumatic injury of the chest, abdomen pelvis.  No acute fracture or dislocation of the thoracic, lumbar spine. Status postcholecystectomy. CAD.     X-ray shoulder no acute evidence of fracture and dislocation. X-ray pelvis no evidence of fracture. Chest x-ray no acute cardiopulmonary disease.   ED cardiology has been evaluated patient for atrial flutter.  In the ED Cardizem drip has been started and maximized without any control of the heart rate.  Given Cardizem is not helping cardiology started  on IV amiodarone.  High embolic risk and per cardiology if unable to give oral anticoagulation recommended IV heparin if it is deemed safe by trauma team.     ED physician Dr. Arva Chafe neurology has been consulted neurology has concern for having some stroke which might have provoked the fall.  Neurology recommended to obtain the MRI and CTA head and neck.  MRI cannot be done tonight as patient has a pacemaker.   In the ED patient has been given ceftriaxone 1 g with the concern for UTI and 1 L of LR bolus due  to sepsis.   Patient has been maxed on Cardizem drip and currently on amiodarone drip and metoprolol 5 mg every 6 hour.  Received multiple doses of fentanyl.  Initially hospitalist has been consulted by the ED physician for the of acute on chronic A-fib RVR/flutter, mechanical fall, sepsis in the setting of UTI, metabolic acidosis, elevated troponin, rhabdomyolysis, AKI on CKD 3B. However While patient in the ED CTA head and neck showed Occluded versus severely stenotic distal left M1 MCA with asymmetrically diminished distal left MCA opacification. Recommend code stroke activation and emergent neurology consultation.  In the ED neurology activated code stroke.  Have a long discussion of neurology with the family and family decided to go ahead and proceed with thrombectomy.  Plan was to take to the OR tonight for thrombectomy.  Afterward patient has been admitted to neuro ICU under neurology service. Hospitalist will continue to follow along as a consulting service for other acute and chronic medical management.  Review of Systems: Review of Systems  Unable to perform ROS: Acuity of condition      Past Medical History:  Diagnosis Date   (HFpEF) heart failure with preserved ejection fraction (HCC)    a. 07/2017 Echo: EF 50-55%, no rwma, mild AS/AI, mod MR, mod dil LA, mod TR, PASP ; b. 03/2021 Echo: EF 60-65%. No rwma. Nl RV fxn. Sev BAE. Mild MR. Mild AI/AS.   Atrial fibrillation (HCC)    Autoimmune hepatitis (HCC)    followed by Dr Marva Panda   CHF (congestive heart failure) [facility])    Fibrocystic breast disease    Glaucoma    Hypercholesterolemia    Hypertension    Hypothyroidism    Inflammatory arthritis    MI (myocardial infarction) (HCC)    Mitral regurgitation    a. 07/2017 Echo: Mod MR; b. 08/2017 Cath: 4+/Severe MR; c. 08/2017 TEE: Mild MR; d. 03/2021 Echo: Mild MR.   Neuropathy    Non-obstructive Coronary artery disease    a. 05/2014 NSTEMI/Cath: LM nl, LAD mild dzs, LCX  60ost hazy (? culprit), RCA mild dzs. EF 60% by echo-->Med Rx; b. 08/2017 Cath: LM 30ost, LAD min irregs, LCX small, 40ost/p, RCA large, min irregs, RPDA/RPL1 nl, EF 50-55%. 4+MR.   Osteoarthritis    Permanent atrial fibrillation (HCC)    a. CHA2DS2VASc = 6-->Xarelto.   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   Past Surgical History:  Procedure Laterality Date   ABDOMINAL HYSTERECTOMY  1963   partial, secondary to fibroids   APPENDECTOMY     Billroth     CARDIAC CATHETERIZATION  05/12/14   [facility]   CARDIOVERSION N/A 12/26/2016   Procedure: CARDIOVERSION;  Surgeon: Iran Ouch, MD;  Location: [facility] ORS;  Service: Cardiovascular;  Laterality: N/A;   CARDIOVERSION N/A 05/16/2017   Procedure: CARDIOVERSION;  Surgeon: Iran Ouch, MD;  Location: [facility] ORS;  Service: Cardiovascular;  Laterality: N/A;   CARDIOVERSION N/A 06/09/2017   Procedure: CARDIOVERSION;  Surgeon: Iran Ouch, MD;  Location: [facility] ORS;  Service: Cardiovascular;  Laterality: N/A;   CARDIOVERSION N/A 11/30/2018   Procedure: CARDIOVERSION;  Surgeon: Iran Ouch, MD;  Location: [facility] ORS;  Service: Cardiovascular;  Laterality: N/A;   CHOLECYSTECTOMY  1998   COLONOSCOPY  2009   PACEMAKER IMPLANT N/A 09/28/2021   Procedure: PACEMAKER IMPLANT;  Surgeon: Lanier Prude, MD;  Location: [facility] INVASIVE CV LAB;  Service: Cardiovascular;  Laterality: N/A;   RIGHT/LEFT HEART CATH AND CORONARY ANGIOGRAPHY Bilateral 08/21/2017   Procedure: RIGHT/LEFT HEART CATH AND CORONARY ANGIOGRAPHY;  Surgeon: Iran Ouch, MD;  Location: [facility] INVASIVE CV LAB;  Service: Cardiovascular;  Laterality: Bilateral;   TEE WITHOUT CARDIOVERSION N/A 08/25/2017   Procedure: TRANSESOPHAGEAL ECHOCARDIOGRAM (TEE);  Surgeon: Dolores Patty, MD;  Location: [facility] ENDOSCOPY;  Service: Cardiovascular;  Laterality: N/A;   UPPER GI ENDOSCOPY  2004   Social History:  reports that she has never smoked.  She has never used smokeless tobacco. She reports that she does not drink alcohol and does not use drugs.  Allergies  Allergen Reactions   Statins Other (See Comments)    Liver damage    Hydroxychloroquine Hives   Haldol [Haloperidol Lactate] Rash   Librax [Chlordiazepoxide-Clidinium] Rash   Plavix [Clopidogrel Bisulfate] Rash   Ramipril Rash   Family History  Problem Relation Age of Onset   Stroke Mother    Esophageal cancer Brother        also had lung cancer   Ovarian cancer Daughter    Colon cancer Daughter    Breast cancer Neg Hx     Prior to Admission medications   Medication Sig Start Date End Date Taking? Authorizing Provider  DULoxetine (CYMBALTA) 20 MG capsule Take 20 mg by mouth 2 (two) times daily.   Yes [provider]  diltiazem (CARDIZEM CD) 240 MG 24 hr capsule Take 240 mg by mouth daily.    [provider]  dorzolamide (TRUSOPT) 2 % ophthalmic solution Place 1 drop into the left eye 2 (two) times daily.    [provider]  fluticasone (FLONASE) 50 MCG/ACT nasal spray Place 2 sprays into both nostrils daily as needed for allergies. 08/27/16   [provider]  fluticasone-salmeterol (ADVAIR HFA) 115-21 MCG/ACT inhaler Inhale 2 puffs into the lungs 2 (two) times daily. 01/02/23   Allegra Grana, FNP  gabapentin (NEURONTIN) 300 MG capsule One capsule tid and two capsules q hs. 01/28/20   Dale Hallam, MD  ipratropium (ATROVENT) 0.03 % nasal spray Place 2 sprays into both nostrils 3 (three) times daily as needed for rhinitis. 04/19/20   [provider]  latanoprost (XALATAN) 0.005 % ophthalmic solution Place 1 drop into both eyes at bedtime.  07/29/18   [provider]  levalbuterol (XOPENEX HFA) 45 MCG/ACT inhaler Inhale 1 puff into the lungs every 6 (six) hours as needed for wheezing or shortness of breath. 12/16/22   Allegra Grana, FNP  levothyroxine (SYNTHROID) 137 MCG tablet TAKE 1 TABLET(137 MCG) BY MOUTH  DAILY BEFORE BREAKFAST 03/11/23   Dale [place], MD  metoprolol tartrate (LOPRESSOR) 100 MG tablet TAKE 1 AND 1/2 TABLETS(150 MG) BY MOUTH TWICE DAILY 03/14/23   Iran Ouch, MD  Multiple Vitamin (MULTI-VITAMIN) tablet Take 1 tablet by mouth at bedtime.     [provider]  Probiotic Product (ALIGN) 4 MG CAPS Take one capsule q day  08/10/21   Dale [place], MD  torsemide (DEMADEX) 20 MG tablet Take 1 tablet (20 mg total) by mouth daily. 11/18/22 03/26/23  Laurey Morale, MD  traMADol (ULTRAM) 50 MG tablet Take 1 tablet (50 mg total) by mouth 2 (two) times daily as needed. 03/17/23   Dale Caspian, MD  ursodiol (ACTIGALL) 300 MG capsule Take 300-600 mg by mouth See admin instructions. Take 2 capsules (600 mg) daily in the morning   & 1 capsule (300 mg) at night.    [provider]  XARELTO 15 MG TABS tablet TAKE 1 TABLET(15 MG) BY MOUTH DAILY WITH SUPPER 02/04/23   Bensimhon, Bevelyn Buckles, MD   Physical Exam:  Constitutional: NAD, calm, comfortable Vitals:   05/14/23 0200 05/14/23 0213 05/14/23 0230 05/14/23 0300  BP: (!) 158/99 (!) 148/57 (!) 124/55 (!) 122/53  Pulse: (!) 116 60 61 65  Resp: (!) 28 13 16 16   Temp:      TempSrc:      SpO2: 93% 100% 99% 99%  Weight:      Height:       Eyes: PERRL, lids and conjunctivae normal ENMT: Mucous membranes are moist. Posterior pharynx clear of any exudate or lesions.Normal dentition.  Neck: normal, supple, no masses, no thyromegaly Respiratory: clear to auscultation bilaterally, no wheezing, no crackles. Normal respiratory effort. No accessory muscle use.  Cardiovascular: Regular rate and rhythm, no murmurs / rubs / gallops. No extremity edema. 2+ pedal pulses. No carotid bruits.  Abdomen: no tenderness, no masses palpated. No hepatosplenomegaly. Bowel sounds positive.  Musculoskeletal: no clubbing / cyanosis. No joint deformity upper and lower extremities. Good ROM, no contractures. Normal muscle tone.  Skin: no rashes,  lesions, ulcers. No induration Neurologic: Dysarthria.  Right-sided upper and lower extremity weakness. Psychiatric: Patient is alert and oriented to bedside family.  Nonverbal.   Labs on Admission:  Basic Metabolic Panel: Recent Labs  Lab 05/13/23 1518 05/13/23 1526 05/13/23 1857 05/14/23 0155  NA 141 143 144 140  K 4.1 4.1 3.8 3.3*  CL 109 113*  --   --   CO2 17*  --   --   --   GLUCOSE 138* 135*  --   --   BUN 29* 31*  --   --   CREATININE 1.24* 1.10*  --   --   CALCIUM 9.5  --   --   --   MG 2.0  --   --   --    Liver Function Tests: Recent Labs  Lab 05/13/23 1518  AST 74*  ALT 50*  ALKPHOS 98  BILITOT 1.3*  PROT 6.8  ALBUMIN 3.8   No results for input(s): "LIPASE", "AMYLASE" in the last 168 hours. Recent Labs  Lab 05/13/23 1835  AMMONIA 17   CBC: Recent Labs  Lab 05/13/23 1518 05/13/23 1526 05/13/23 1857 05/14/23 0155  WBC 14.0*  --   --   --   HGB 15.6* 16.7* 15.3* 13.6  HCT 49.2* 49.0* 45.0 40.0  MCV 98.0  --   --   --   PLT 206  --   --   --    Cardiac Enzymes: Recent Labs  Lab 05/13/23 1518  CKTOTAL 1,135*   BNP: Invalid input(s): "POCBNP" CBG: Recent Labs  Lab 05/13/23 1517  GLUCAP 150*    Radiological Exams on Admission: DG Abd Portable 1V Result Date: 05/14/2023 CLINICAL DATA:  Check gastric catheter placement EXAM: PORTABLE ABDOMEN - 1 VIEW COMPARISON:  None Available. FINDINGS:  Gastric catheter is noted within the stomach. Contrast is noted within the kidneys bilaterally. No free air is seen. IMPRESSION: Gastric catheter in the stomach. Electronically Signed   By: Alcide Clever M.D.   On: 05/14/2023 02:54   Portable Chest x-ray Result Date: 05/14/2023 CLINICAL DATA:  Status post intubation EXAM: PORTABLE CHEST 1 VIEW COMPARISON:  Film from the previous day. FINDINGS: Cardiac shadow is within normal limits. Pacing device is again seen. Endotracheal tube and gastric catheter are noted in satisfactory position. Lungs are free of acute  infiltrate or effusion. IMPRESSION: No active disease. Electronically Signed   By: Alcide Clever M.D.   On: 05/14/2023 02:53   CT ANGIO HEAD NECK W WO CM W PERF Result Date: 05/13/2023 CLINICAL DATA:  Neuro deficit, acute, stroke suspected EXAM: CT ANGIOGRAPHY HEAD AND NECK CT PERFUSION BRAIN TECHNIQUE: Multidetector CT imaging of the head and neck was performed using the standard protocol during bolus administration of intravenous contrast. Multiplanar CT image reconstructions and MIPs were obtained to evaluate the vascular anatomy. Carotid stenosis measurements (when applicable) are obtained utilizing NASCET criteria, using the distal internal carotid diameter as the denominator. Multiphase CT imaging of the brain was performed following IV bolus contrast injection. Subsequent parametric perfusion maps were calculated using RAPID software. RADIATION DOSE REDUCTION: This exam was performed according to the departmental dose-optimization program which includes automated exposure control, adjustment of the mA and/or kV according to patient size and/or use of iterative reconstruction technique. CONTRAST:  OMNIPAQUE IOHEXOL 350 MG/ML SOLN COMPARISON:  Same day CT head. FINDINGS: CTA NECK FINDINGS Aortic arch: Atherosclerosis.  Great vessel origins are patent. Right carotid system: Atherosclerosis at the carotid bifurcation without greater than 50% stenosis. Also, atherosclerosis at the skull base with approximately 60% stenosis. Left carotid system: Atherosclerosis without greater than 50% stenosis. Vertebral arteries: Left dominant. No significant (greater than 50%) stenosis. Skeleton: No acute abnormality on limited assessment. Other neck: No acute abnormality on limited assessment. Upper chest: Visualized lung apices are clear. Review of the MIP images confirms the above findings CTA HEAD FINDINGS Anterior circulation: Bilateral intracranial ICAs are patent without significant stenosis. Right MCA and  bilateral ACAs are patent without proximal hemodynamically significant stenosis. Distal left M1 MCA occlusion versus severe stenosis. Asymmetric opacification of the left MCA vessels more distally. Posterior circulation: Bilateral intradural vertebral arteries, basilar artery and bilateral posterior cerebral arteries are patent without proximal hemodynamically significant stenosis. Venous sinuses: As permitted by contrast timing, patent. Review of the MIP images confirms the above findings CT Brain Perfusion Findings: CBF (<30%) Volume: 0mL Perfusion (Tmax>6.0s) volume: 50mL Mismatch Volume: 50mL Infarction Location:None identified. IMPRESSION: 1. Occluded versus severely stenotic distal left M1 MCA with asymmetrically diminished distal left MCA opacification. Recommend code stroke activation and emergent neurology consultation. 2. Approximately 50 mL of left MCA penumbra without evidence of core infarct by CT perfusion. 3. Approximately 60% stenosis of the right ICA at the skull base. Findings and recommendations discussed with Dr. Wilkie Aye via telephone at Dr. Wilkie Aye. Electronically Signed   By: Feliberto Harts M.D.   On: 05/13/2023 21:40   DG Shoulder Right Portable Result Date: 05/13/2023 CLINICAL DATA:  Status post trauma. EXAM: RIGHT SHOULDER - 1 VIEW COMPARISON:  None Available. FINDINGS: Limited study secondary to numerous overlying radiopaque cardiac lead wires. There is no evidence of acute fracture or dislocation. Chronic changes are seen along the greater tubercle and inferior medial aspect of the right humeral. There are moderate severity degenerative changes. Soft tissues  are unremarkable. IMPRESSION: 1. Limited study without evidence of acute fracture or dislocation. 2. Moderate severity chronic and degenerative changes. Electronically Signed   By: Aram Candela M.D.   On: 05/13/2023 17:46   DG Pelvis Portable Result Date: 05/13/2023 CLINICAL DATA:  Status post trauma. EXAM: PORTABLE PELVIS  1-2 VIEWS COMPARISON:  March 05, 2016 FINDINGS: There is no evidence of pelvic fracture or diastasis. No pelvic bone lesions are seen. IMPRESSION: Negative. Electronically Signed   By: Aram Candela M.D.   On: 05/13/2023 17:45   DG Chest Port 1 View Result Date: 05/13/2023 CLINICAL DATA:  Status post trauma. EXAM: PORTABLE CHEST 1 VIEW COMPARISON:  December 17, 2022 FINDINGS: There is stable dual lead AICD positioning. The heart size and mediastinal contours are within normal limits. Marked severity calcification of the thoracic aorta is noted. There is no evidence of an acute infiltrate, pleural effusion or pneumothorax. Radiopaque surgical clips are seen overlying the esophageal hiatus. There is mild dextroscoliosis of the mid and lower thoracic spine with multilevel degenerative changes. IMPRESSION: No active cardiopulmonary disease. Electronically Signed   By: Aram Candela M.D.   On: 05/13/2023 17:44   CT CHEST ABDOMEN PELVIS W CONTRAST Result Date: 05/13/2023 CLINICAL DATA:  Found unresponsive, unknown down time EXAM: CT CHEST, ABDOMEN, AND PELVIS WITH CONTRAST CT THORACIC AND LUMBAR SPINE WITH CONTRAST TECHNIQUE: Multidetector CT imaging of the chest, abdomen and pelvis was performed following the standard protocol during bolus administration of intravenous contrast. Multidetector CT imaging of the thoracic and lumbar spine was performed following the standard protocol during bolus administration of intravenous contrast. RADIATION DOSE REDUCTION: This exam was performed according to the departmental dose-optimization program which includes automated exposure control, adjustment of the mA and/or kV according to patient size and/or use of iterative reconstruction technique. CONTRAST:  75mL OMNIPAQUE IOHEXOL 350 MG/ML SOLN COMPARISON:  CT abdomen pelvis, 04/22/2014 FINDINGS: CT CHEST FINDINGS Cardiovascular: Left chest multi lead pacer. Aortic atherosclerosis. Normal heart size. Scattered left and  right coronary artery calcifications. No pericardial effusion. Mediastinum/Nodes: No enlarged mediastinal, hilar, or axillary lymph nodes. Thyroid gland, trachea, and esophagus demonstrate no significant findings. Lungs/Pleura: Lungs are clear. No pleural effusion or pneumothorax. Musculoskeletal: No chest wall mass or suspicious osseous lesions identified. CT ABDOMEN PELVIS FINDINGS Hepatobiliary: No focal liver abnormality is seen. Status post cholecystectomy. Similar severe postoperative biliary ductal dilatation. Pancreas: Unremarkable. No pancreatic ductal dilatation or surrounding inflammatory changes. Spleen: Normal in size without significant abnormality. Adrenals/Urinary Tract: Adrenal glands are unremarkable. Kidneys are normal, without renal calculi, solid lesion, or hydronephrosis. Bladder is unremarkable. Stomach/Bowel: Stomach is within normal limits. Appendix not clearly visualized. No evidence of bowel wall thickening, distention, or inflammatory changes. Vascular/Lymphatic: Aortic atherosclerosis. 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 No enlarged abdominal or pelvic lymph nodes. Reproductive: No mass or other abnormality. Other: No abdominal wall hernia or abnormality. No ascites. Musculoskeletal: No acute osseous findings. CT THORACIC AND LUMBAR SPINE FINDINGS Alignment: Normal thoracic kyphosis. Normal lumbar lordosis. Vertebral bodies: No acute fracture or dislocation. Unchanged mild superior endplate wedge deformity of L2, with approximately 20% height loss. Disc spaces: Generally mild multilevel disc space height loss and osteophytosis throughout the thoracic and lumbar spine, focally moderate at L3-L4. Paraspinous soft tissues: Unremarkable. IMPRESSION: 1. No CT evidence of acute traumatic injury to the chest, abdomen, or pelvis. 2. No acute fracture or dislocation of the thoracic or lumbar spine. Unchanged mild, chronic  wedge  deformity of L2. 3. Status post cholecystectomy. Similar severe postoperative biliary ductal dilatation. 4. Coronary artery disease. Aortic Atherosclerosis (ICD10-I70.0). Electronically Signed   By: Jearld Lesch M.D.   On: 05/13/2023 17:28   CT L-SPINE NO CHARGE Result Date: 05/13/2023 CLINICAL DATA:  Found unresponsive, unknown down time EXAM: CT CHEST, ABDOMEN, AND PELVIS WITH CONTRAST CT THORACIC AND LUMBAR SPINE WITH CONTRAST TECHNIQUE: Multidetector CT imaging of the chest, abdomen and pelvis was performed following the standard protocol during bolus administration of intravenous contrast. Multidetector CT imaging of the thoracic and lumbar spine was performed following the standard protocol during bolus administration of intravenous contrast. RADIATION DOSE REDUCTION: This exam was performed according to the departmental dose-optimization program which includes automated exposure control, adjustment of the mA and/or kV according to patient size and/or use of iterative reconstruction technique. CONTRAST:  75mL OMNIPAQUE IOHEXOL 350 MG/ML SOLN COMPARISON:  CT abdomen pelvis, 04/22/2014 FINDINGS: CT CHEST FINDINGS Cardiovascular: Left chest multi lead pacer. Aortic atherosclerosis. Normal heart size. Scattered left and right coronary artery calcifications. No pericardial effusion. Mediastinum/Nodes: No enlarged mediastinal, hilar, or axillary lymph nodes. Thyroid gland, trachea, and esophagus demonstrate no significant findings. Lungs/Pleura: Lungs are clear. No pleural effusion or pneumothorax. Musculoskeletal: No chest wall mass or suspicious osseous lesions identified. CT ABDOMEN PELVIS FINDINGS Hepatobiliary: No focal liver abnormality is seen. Status post cholecystectomy. Similar severe postoperative biliary ductal dilatation. Pancreas: Unremarkable. No pancreatic ductal dilatation or surrounding inflammatory changes. Spleen: Normal in size without significant abnormality. Adrenals/Urinary Tract:  Adrenal glands are unremarkable. Kidneys are normal, without renal calculi, solid lesion, or hydronephrosis. Bladder is unremarkable. Stomach/Bowel: Stomach is within normal limits. Appendix not clearly visualized. No evidence of bowel wall thickening, distention, or inflammatory changes. Vascular/Lymphatic: Aortic atherosclerosis. 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 No enlarged abdominal or pelvic lymph nodes. Reproductive: No mass or other abnormality. Other: No abdominal wall hernia or abnormality. No ascites. Musculoskeletal: No acute osseous findings. CT THORACIC AND LUMBAR SPINE FINDINGS Alignment: Normal thoracic kyphosis. Normal lumbar lordosis. Vertebral bodies: No acute fracture or dislocation. Unchanged mild superior endplate wedge deformity of L2, with approximately 20% height loss. Disc spaces: Generally mild multilevel disc space height loss and osteophytosis throughout the thoracic and lumbar spine, focally moderate at L3-L4. Paraspinous soft tissues: Unremarkable. IMPRESSION: 1. No CT evidence of acute traumatic injury to the chest, abdomen, or pelvis. 2. No acute fracture or dislocation of the thoracic or lumbar spine. Unchanged mild, chronic wedge deformity of L2. 3. Status post cholecystectomy. Similar severe postoperative biliary ductal dilatation. 4. Coronary artery disease. Aortic Atherosclerosis (ICD10-I70.0). Electronically Signed   By: Jearld Lesch M.D.   On: 05/13/2023 17:28   CT T-SPINE NO CHARGE Result Date: 05/13/2023 CLINICAL DATA:  Found unresponsive, unknown down time EXAM: CT CHEST, ABDOMEN, AND PELVIS WITH CONTRAST CT THORACIC AND LUMBAR SPINE WITH CONTRAST TECHNIQUE: Multidetector CT imaging of the chest, abdomen and pelvis was performed following the standard protocol during bolus administration of intravenous contrast. Multidetector CT imaging of the thoracic and lumbar spine was performed following the  standard protocol during bolus administration of intravenous contrast. RADIATION DOSE REDUCTION: This exam was performed according to the departmental dose-optimization program which includes automated exposure control, adjustment of the mA and/or kV according to patient size and/or use of iterative reconstruction technique. CONTRAST:  75mL OMNIPAQUE IOHEXOL 350 MG/ML SOLN COMPARISON:  CT abdomen pelvis, 04/22/2014 FINDINGS: CT CHEST FINDINGS Cardiovascular: Left chest multi lead pacer.  Aortic atherosclerosis. Normal heart size. Scattered left and right coronary artery calcifications. No pericardial effusion. Mediastinum/Nodes: No enlarged mediastinal, hilar, or axillary lymph nodes. Thyroid gland, trachea, and esophagus demonstrate no significant findings. Lungs/Pleura: Lungs are clear. No pleural effusion or pneumothorax. Musculoskeletal: No chest wall mass or suspicious osseous lesions identified. CT ABDOMEN PELVIS FINDINGS Hepatobiliary: No focal liver abnormality is seen. Status post cholecystectomy. Similar severe postoperative biliary ductal dilatation. Pancreas: Unremarkable. No pancreatic ductal dilatation or surrounding inflammatory changes. Spleen: Normal in size without significant abnormality. Adrenals/Urinary Tract: Adrenal glands are unremarkable. Kidneys are normal, without renal calculi, solid lesion, or hydronephrosis. Bladder is unremarkable. Stomach/Bowel: Stomach is within normal limits. Appendix not clearly visualized. No evidence of bowel wall thickening, distention, or inflammatory changes. Vascular/Lymphatic: Aortic atherosclerosis. 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 No enlarged abdominal or pelvic lymph nodes. Reproductive: No mass or other abnormality. Other: No abdominal wall hernia or abnormality. No ascites. Musculoskeletal: No acute osseous findings. CT THORACIC AND LUMBAR SPINE FINDINGS Alignment: Normal  thoracic kyphosis. Normal lumbar lordosis. Vertebral bodies: No acute fracture or dislocation. Unchanged mild superior endplate wedge deformity of L2, with approximately 20% height loss. Disc spaces: Generally mild multilevel disc space height loss and osteophytosis throughout the thoracic and lumbar spine, focally moderate at L3-L4. Paraspinous soft tissues: Unremarkable. IMPRESSION: 1. No CT evidence of acute traumatic injury to the chest, abdomen, or pelvis. 2. No acute fracture or dislocation of the thoracic or lumbar spine. Unchanged mild, chronic wedge deformity of L2. 3. Status post cholecystectomy. Similar severe postoperative biliary ductal dilatation. 4. Coronary artery disease. Aortic Atherosclerosis (ICD10-I70.0). Electronically Signed   By: Jearld Lesch M.D.   On: 05/13/2023 17:28   CT HEAD WO CONTRAST Result Date: 05/13/2023 CLINICAL DATA:  Found unresponsive, unknown downtime EXAM: CT HEAD WITHOUT CONTRAST CT MAXILLOFACIAL WITHOUT CONTRAST CT CERVICAL SPINE WITHOUT CONTRAST TECHNIQUE: Multidetector CT imaging of the head, cervical spine, and maxillofacial structures were performed using the standard protocol without intravenous contrast. Multiplanar CT image reconstructions of the cervical spine and maxillofacial structures were also generated. RADIATION DOSE REDUCTION: This exam was performed according to the departmental dose-optimization program which includes automated exposure control, adjustment of the mA and/or kV according to patient size and/or use of iterative reconstruction technique. COMPARISON:  04/10/2023 FINDINGS: CT HEAD FINDINGS Brain: No evidence of acute infarction, hemorrhage, hydrocephalus, extra-axial collection or mass lesion/mass effect. Mild periventricular and deep white matter hypodensity. Unchanged left occipital encephalomalacia. Vascular: No hyperdense vessel or unexpected calcification. CT FACIAL BONES FINDINGS Skull: Normal. Negative for fracture or focal lesion.  Facial bones: No displaced fractures or dislocations. Sinuses/Orbits: Small air-fluid level in the right maxillary sinus. No overlying fracture. Other: Patient is partially edentulous with erosion of the left maxillary alveolar bone into the left maxillary sinus (series 7, image 31). CT CERVICAL SPINE FINDINGS Alignment: Normal. Skull base and vertebrae: No acute fracture. No primary bone lesion or focal pathologic process. Soft tissues and spinal canal: No prevertebral fluid or swelling. No visible canal hematoma. Disc levels: Focally moderate disc space height loss and osteophytosis of the lower cervical levels C5-C7 Upper chest: Negative. Other: None. IMPRESSION: 1. No acute intracranial pathology. Small-vessel white matter disease and unchanged left occipital encephalomalacia. 2. No displaced fractures or dislocations of the facial bones. 3. Small air-fluid level in the right maxillary sinus. No overlying fracture. Patient is partially edentulous with erosion of the left maxillary alveolar bone into the left maxillary  sinus. Correlate for sinusitis. 4. No fracture or static subluxation of the cervical spine. 5. Focally moderate disc space height loss and osteophytosis of the lower cervical levels. Electronically Signed   By: Jearld Lesch M.D.   On: 05/13/2023 17:20   CT CERVICAL SPINE WO CONTRAST Result Date: 05/13/2023 CLINICAL DATA:  Found unresponsive, unknown downtime EXAM: CT HEAD WITHOUT CONTRAST CT MAXILLOFACIAL WITHOUT CONTRAST CT CERVICAL SPINE WITHOUT CONTRAST TECHNIQUE: Multidetector CT imaging of the head, cervical spine, and maxillofacial structures were performed using the standard protocol without intravenous contrast. Multiplanar CT image reconstructions of the cervical spine and maxillofacial structures were also generated. RADIATION DOSE REDUCTION: This exam was performed according to the departmental dose-optimization program which includes automated exposure control, adjustment of the mA  and/or kV according to patient size and/or use of iterative reconstruction technique. COMPARISON:  04/10/2023 FINDINGS: CT HEAD FINDINGS Brain: No evidence of acute infarction, hemorrhage, hydrocephalus, extra-axial collection or mass lesion/mass effect. Mild periventricular and deep white matter hypodensity. Unchanged left occipital encephalomalacia. Vascular: No hyperdense vessel or unexpected calcification. CT FACIAL BONES FINDINGS Skull: Normal. Negative for fracture or focal lesion. Facial bones: No displaced fractures or dislocations. Sinuses/Orbits: Small air-fluid level in the right maxillary sinus. No overlying fracture. Other: Patient is partially edentulous with erosion of the left maxillary alveolar bone into the left maxillary sinus (series 7, image 31). CT CERVICAL SPINE FINDINGS Alignment: Normal. Skull base and vertebrae: No acute fracture. No primary bone lesion or focal pathologic process. Soft tissues and spinal canal: No prevertebral fluid or swelling. No visible canal hematoma. Disc levels: Focally moderate disc space height loss and osteophytosis of the lower cervical levels C5-C7 Upper chest: Negative. Other: None. IMPRESSION: 1. No acute intracranial pathology. Small-vessel white matter disease and unchanged left occipital encephalomalacia. 2. No displaced fractures or dislocations of the facial bones. 3. Small air-fluid level in the right maxillary sinus. No overlying fracture. Patient is partially edentulous with erosion of the left maxillary alveolar bone into the left maxillary sinus. Correlate for sinusitis. 4. No fracture or static subluxation of the cervical spine. 5. Focally moderate disc space height loss and osteophytosis of the lower cervical levels. Electronically Signed   By: Jearld Lesch M.D.   On: 05/13/2023 17:20   CT MAXILLOFACIAL WO CONTRAST Result Date: 05/13/2023 CLINICAL DATA:  Found unresponsive, unknown downtime EXAM: CT HEAD WITHOUT CONTRAST CT MAXILLOFACIAL WITHOUT  CONTRAST CT CERVICAL SPINE WITHOUT CONTRAST TECHNIQUE: Multidetector CT imaging of the head, cervical spine, and maxillofacial structures were performed using the standard protocol without intravenous contrast. Multiplanar CT image reconstructions of the cervical spine and maxillofacial structures were also generated. RADIATION DOSE REDUCTION: This exam was performed according to the departmental dose-optimization program which includes automated exposure control, adjustment of the mA and/or kV according to patient size and/or use of iterative reconstruction technique. COMPARISON:  04/10/2023 FINDINGS: CT HEAD FINDINGS Brain: No evidence of acute infarction, hemorrhage, hydrocephalus, extra-axial collection or mass lesion/mass effect. Mild periventricular and deep white matter hypodensity. Unchanged left occipital encephalomalacia. Vascular: No hyperdense vessel or unexpected calcification. CT FACIAL BONES FINDINGS Skull: Normal. Negative for fracture or focal lesion. Facial bones: No displaced fractures or dislocations. Sinuses/Orbits: Small air-fluid level in the right maxillary sinus. No overlying fracture. Other: Patient is partially edentulous with erosion of the left maxillary alveolar bone into the left maxillary sinus (series 7, image 31). CT CERVICAL SPINE FINDINGS Alignment: Normal. Skull base and vertebrae: No acute fracture. No primary bone lesion or focal pathologic process. Soft  tissues and spinal canal: No prevertebral fluid or swelling. No visible canal hematoma. Disc levels: Focally moderate disc space height loss and osteophytosis of the lower cervical levels C5-C7 Upper chest: Negative. Other: None. IMPRESSION: 1. No acute intracranial pathology. Small-vessel white matter disease and unchanged left occipital encephalomalacia. 2. No displaced fractures or dislocations of the facial bones. 3. Small air-fluid level in the right maxillary sinus. No overlying fracture. Patient is partially edentulous  with erosion of the left maxillary alveolar bone into the left maxillary sinus. Correlate for sinusitis. 4. No fracture or static subluxation of the cervical spine. 5. Focally moderate disc space height loss and osteophytosis of the lower cervical levels. Electronically Signed   By: Jearld Lesch M.D.   On: 05/13/2023 17:20    EKG: Independently reviewed.  Wide-complex V. tach heart rate 143.  Time spent: 1 hour 30 min  Tereasa Coop MD [facility]  How to contact the [facility] Attending or Consulting provider 7A - 7P or covering provider during after hours 7P -7A, for this patient.  Check the care team in [facility] and look for a) attending/consulting [facility] provider listed and b) the [facility] team listed Log into [email or web address] and use [place]'s universal password to access. If you do not have the password, please contact the hospital operator. Locate the [facility] provider you are looking for under [facility] and page to a number that you can be directly reached. If you still have difficulty reaching the provider, please page the [facility] (Director on Call) for the Hospitalists listed on amion for assistance.  05/14/2023, 3:17 AM

## 2023-05-14 NOTE — Progress Notes (Signed)
 OT Cancellation Note  Patient Details Name: Ruth Roth MRN: [id number] DOB: May 31, 1928   Cancelled Treatment:    Reason Eval/Treat Not Completed: Medical issues which prohibited therapy;Patient at procedure or test/ unavailable (pt with REd-yellow MEWS just recently extubated and going down for MRI now. Will follow up tomorrow.)  Tyler Deis, OTR/L [facility] Acute Rehabilitation Office: [phone]   Myrla Halsted 05/14/2023, 1:51 PM

## 2023-05-14 NOTE — Procedures (Signed)
 INR.  Status post left common carotid arteriogram.  Right CFA approach.  Findings.    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  Status post complete revascularization of the occluded distal left middle cerebral M1 segment and superior and inferior divisions, with  3 passes of contact aspiration, and 1 pass with a 4 mm x 40 mm solitaire X retriever device  and   contact aspiration achieving a TICI 3 revascularization.  Post CT brain no evidence of hemorrhagic complications.  Incidental note made of an enhancing smooth mass in the region of the greater wing  of the right  sphenoid bone most consistent with a meningioma.  8 French Angio-Seal closure device deployed for hemostasis at the right groin puncture site.  Distal pulses all present bilaterally unchanged.  Patient left intubated due to her medical condition prior to intubation.  Fatima Sanger MD.

## 2023-05-14 NOTE — Consult Note (Addendum)
 WOC Nurse Consult Note: this consult performed remotely with assistance from bedside RN B. Simpson, appreciate her assistance with photo documentation  Reason for Consult: MASD to genitals and thigh,  stage 2 buttocks  Wound type: 1.  Intertriginous dermatitis to groin/inner thighs 2.  Linear Stage 2 to B buttocks that also appears to have a moisture component  Pressure Injury POA: Yes Measurement: see nursing flowsheet; Wound bed: widespread erythema and partial thickness skin loss to groin/inner thighs from moisture and friction   Drainage (amount, consistency, odor) see nursing flowsheet  Periwound: erythema, ?fungal component to thighs/genital area  Dressing procedure/placement/frequency: Cleanse inner thighs, groin/perineum and buttocks with Vashe wound cleanser, do not rinse and allow to air dry. Apply Gerhardt's Butt Cream to entire area 3 times daily and as needed for soiling.  Sprinkle over Gerhardt's with floor stock antifungal powder (green and white label Microguard).    POC discussed with bedside nurse. WOC team will not follow. Re-consult if further needs arise.   Thank you,    Priscella Mann MSN, RN-BC, [employer] [phone]

## 2023-05-14 NOTE — Anesthesia Postprocedure Evaluation (Signed)
 Anesthesia Post Note  Patient: Ruth Roth  Procedure(s) Performed: RADIOLOGY WITH ANESTHESIA     Patient location during evaluation: SICU Anesthesia Type: General Level of consciousness: sedated Pain management: pain level controlled Vital Signs Assessment: post-procedure vital signs reviewed and stable Respiratory status: patient remains intubated per anesthesia plan Cardiovascular status: stable Postop Assessment: no apparent nausea or vomiting Anesthetic complications: no  No notable events documented.  Last Vitals:  Vitals:   05/13/23 2230 05/14/23 0057  BP: (!) 144/88   Pulse: (!) 137   Resp: 18 20  Temp:    SpO2: 100%     Last Pain:  Vitals:   05/13/23 2018  TempSrc: Temporal                 Ruth Roth

## 2023-05-14 NOTE — Code Documentation (Signed)
 Responded to Code IR paged out at 2219 on pt already in the ED. Code IR paged for L MCA occlusion, LSN-Sunday. TNK not given-outside window. CTA/CTP-L MCA occlusion 50 cc penumbra with no core infarct. Pt transported to IR suite at 2232.

## 2023-05-14 NOTE — Transfer of Care (Signed)
 Immediate Anesthesia Transfer of Care Note  Patient: Ruth Roth  Procedure(s) Performed: RADIOLOGY WITH ANESTHESIA  Patient Location: SICU  Anesthesia Type:General  Level of Consciousness: Patient remains intubated per anesthesia plan  Airway & Oxygen Therapy: Pt placed on ventilator by RT  Post-op Assessment: Report given to RN and Post -op Vital signs reviewed and stable  Post vital signs: Reviewed and stable  Last Vitals:  Vitals Value Taken Time  BP 140/78   Temp    Pulse 127 05/14/23 0057  Resp 21 05/14/23 0057  SpO2 100 % 05/14/23 0057  Vitals shown include unfiled device data.  Last Pain:  Vitals:   05/13/23 2018  TempSrc: Temporal         Complications: No notable events documented.

## 2023-05-14 NOTE — Progress Notes (Signed)
   Patient Name: Ruth Roth Date of Encounter: 05/14/2023 [facility] Cardiologist: Lorine Bears, MD   Interval Summary  .    Intubated, sedated, but arousable.  Vital Signs .    Vitals:   05/14/23 0818 05/14/23 0900 05/14/23 1000 05/14/23 1100  BP:  (!) 137/54 (!) 141/52 (!) 141/94  Pulse: 90 90 94 (!) 124  Resp: 18 18 19 18   Temp:      TempSrc:      SpO2: 98% 100% 100% 100%  Weight:      Height:        Intake/Output Summary (Last 24 hours) at 05/14/2023 1125 Last data filed at 05/14/2023 1100 Gross per 24 hour  Intake 3767.54 ml  Output 855 ml  Net 2912.54 ml      05/14/2023    1:28 AM 05/14/2023    1:00 AM 05/13/2023    3:22 PM  Last 3 Weights  Weight (lbs) 137 lb 2 oz 133 lb 13.1 oz 140 lb  Weight (kg) 62.2 kg 60.7 kg 63.504 kg      Telemetry/ECG    Aflutter with controlled rates - Personally Reviewed  Physical Exam .   GEN: No acute distress.   Neck: No JVD Cardiac: irregular rhythm, no murmurs, rubs, or gallops.  Respiratory: Clear to auscultation bilaterally. GI: Soft, nontender, non-distended  MS: No edema  Assessment & Plan .     Pacemaker interrogation shows normal device function. The device is MRI compatible.  As rate control is better, will DC the amiodarone and continue the diltiazem and metoprolol IV. No evidence of HF exacerbation.  For questions or updates, please contact [facility] Please consult [email or web address] for contact info under        Signed, Thurmon Fair, MD

## 2023-05-14 NOTE — Progress Notes (Signed)
 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 Corliss Skains 05/14/23  Subjective: Patient intubated but weaning. She is able to minimally follow a few simple commands. Two family members at the bedside.   Allergies: Statins, Hydroxychloroquine, Haldol [haloperidol lactate], Librax [chlordiazepoxide-clidinium], Plavix [clopidogrel bisulfate], and Ramipril  Medications: Prior to Admission medications   Medication Sig Start Date End Date Taking? Authorizing Provider  DULoxetine (CYMBALTA) 20 MG capsule Take 20 mg by mouth 2 (two) times daily.   Yes [provider]  diltiazem (CARDIZEM CD) 240 MG 24 hr capsule Take 240 mg by mouth daily.    [provider]  dorzolamide (TRUSOPT) 2 % ophthalmic solution Place 1 drop into the left eye 2 (two) times daily.    [provider]  fluticasone (FLONASE) 50 MCG/ACT nasal spray Place 2 sprays into both nostrils daily as needed for allergies. 08/27/16   [provider]  fluticasone-salmeterol (ADVAIR HFA) 115-21 MCG/ACT inhaler Inhale 2 puffs into the lungs 2 (two) times daily. 01/02/23   Allegra Grana, FNP  gabapentin (NEURONTIN) 300 MG capsule One capsule tid and two capsules q hs. 01/28/20   Dale [place], MD  ipratropium (ATROVENT) 0.03 % nasal spray Place 2 sprays into both nostrils 3 (three) times daily as needed for rhinitis. 04/19/20   [provider]  latanoprost (XALATAN) 0.005 % ophthalmic solution Place 1 drop into both eyes at bedtime.  07/29/18   [provider]  levalbuterol (XOPENEX HFA) 45 MCG/ACT inhaler Inhale 1 puff into the lungs every 6 (six) hours as needed for wheezing or shortness of breath. 12/16/22   Allegra Grana, FNP  levothyroxine (SYNTHROID) 137 MCG tablet TAKE 1 TABLET(137 MCG)  BY MOUTH DAILY BEFORE BREAKFAST 03/11/23   Dale [place], MD  metoprolol tartrate (LOPRESSOR) 100 MG tablet TAKE 1 AND 1/2 TABLETS(150 MG) BY MOUTH TWICE DAILY 03/14/23   Iran Ouch, MD  Multiple Vitamin (MULTI-VITAMIN) tablet Take 1 tablet by mouth at bedtime.     [provider]  Probiotic Product (ALIGN) 4 MG CAPS Take one capsule q day 08/10/21   Dale [place], MD  torsemide (DEMADEX) 20 MG tablet Take 1 tablet (20 mg total) by mouth daily. 11/18/22 03/26/23  Laurey Morale, MD  traMADol (ULTRAM) 50 MG tablet Take 1 tablet (50 mg total) by mouth 2 (two) times daily as needed. 03/17/23   Dale [place], MD  ursodiol (ACTIGALL) 300 MG capsule Take 300-600 mg by mouth See admin instructions. Take 2 capsules (600 mg) daily in the morning   & 1 capsule (300 mg) at night.    [provider]  XARELTO 15 MG TABS tablet TAKE 1 TABLET(15 MG) BY MOUTH DAILY WITH SUPPER 02/04/23   Bensimhon, Bevelyn Buckles, MD     Vital Signs: BP (!) 131/52   Pulse 90   Temp (!) 97.1 F (36.2 C) (Axillary)   Resp 18   Ht 5\' 3"  (1.6 m)   Wt 137 lb 2 oz (62.2 kg)   SpO2 98%   BMI 24.29 kg/m   Physical Exam Constitutional:      General: She is not in acute distress. Eyes:     Pupils: Pupils are equal, round, and reactive to light.  Cardiovascular:     Comments: Right groin vascular site is clean, soft, dry  Pulmonary:  Comments: intubated Skin:    General: Skin is warm and dry.  Neurological:     Comments: Left grip strong. Able to wiggle toes on both feet.      Imaging: DG Abd Portable 1V Result Date: 05/14/2023 CLINICAL DATA:  Check gastric catheter placement EXAM: PORTABLE ABDOMEN - 1 VIEW COMPARISON:  None Available. FINDINGS: Gastric catheter is noted within the stomach. Contrast is noted within the kidneys bilaterally. No free air is seen. IMPRESSION: Gastric catheter in the stomach. Electronically Signed   By: Alcide Clever M.D.   On: 05/14/2023 02:54   Portable Chest  x-ray Result Date: 05/14/2023 CLINICAL DATA:  Status post intubation EXAM: PORTABLE CHEST 1 VIEW COMPARISON:  Film from the previous day. FINDINGS: Cardiac shadow is within normal limits. Pacing device is again seen. Endotracheal tube and gastric catheter are noted in satisfactory position. Lungs are free of acute infiltrate or effusion. IMPRESSION: No active disease. Electronically Signed   By: Alcide Clever M.D.   On: 05/14/2023 02:53   CT ANGIO HEAD NECK W WO CM W PERF Result Date: 05/13/2023 CLINICAL DATA:  Neuro deficit, acute, stroke suspected EXAM: CT ANGIOGRAPHY HEAD AND NECK CT PERFUSION BRAIN TECHNIQUE: Multidetector CT imaging of the head and neck was performed using the standard protocol during bolus administration of intravenous contrast. Multiplanar CT image reconstructions and MIPs were obtained to evaluate the vascular anatomy. Carotid stenosis measurements (when applicable) are obtained utilizing NASCET criteria, using the distal internal carotid diameter as the denominator. Multiphase CT imaging of the brain was performed following IV bolus contrast injection. Subsequent parametric perfusion maps were calculated using RAPID software. RADIATION DOSE REDUCTION: This exam was performed according to the departmental dose-optimization program which includes automated exposure control, adjustment of the mA and/or kV according to patient size and/or use of iterative reconstruction technique. CONTRAST:  OMNIPAQUE IOHEXOL 350 MG/ML SOLN COMPARISON:  Same day CT head. FINDINGS: CTA NECK FINDINGS Aortic arch: Atherosclerosis.  Great vessel origins are patent. Right carotid system: Atherosclerosis at the carotid bifurcation without greater than 50% stenosis. Also, atherosclerosis at the skull base with approximately 60% stenosis. Left carotid system: Atherosclerosis without greater than 50% stenosis. Vertebral arteries: Left dominant. No significant (greater than 50%) stenosis. Skeleton: No acute  abnormality on limited assessment. Other neck: No acute abnormality on limited assessment. Upper chest: Visualized lung apices are clear. Review of the MIP images confirms the above findings CTA HEAD FINDINGS Anterior circulation: Bilateral intracranial ICAs are patent without significant stenosis. Right MCA and bilateral ACAs are patent without proximal hemodynamically significant stenosis. Distal left M1 MCA occlusion versus severe stenosis. Asymmetric opacification of the left MCA vessels more distally. Posterior circulation: Bilateral intradural vertebral arteries, basilar artery and bilateral posterior cerebral arteries are patent without proximal hemodynamically significant stenosis. Venous sinuses: As permitted by contrast timing, patent. Review of the MIP images confirms the above findings CT Brain Perfusion Findings: CBF (<30%) Volume: 0mL Perfusion (Tmax>6.0s) volume: 50mL Mismatch Volume: 50mL Infarction Location:None identified. IMPRESSION: 1. Occluded versus severely stenotic distal left M1 MCA with asymmetrically diminished distal left MCA opacification. Recommend code stroke activation and emergent neurology consultation. 2. Approximately 50 mL of left MCA penumbra without evidence of core infarct by CT perfusion. 3. Approximately 60% stenosis of the right ICA at the skull base. Findings and recommendations discussed with Dr. Wilkie Aye via telephone at Dr. Wilkie Aye. Electronically Signed   By: Feliberto Harts M.D.   On: 05/13/2023 21:40   DG Shoulder Right Portable Result Date: 05/13/2023  CLINICAL DATA:  Status post trauma. EXAM: RIGHT SHOULDER - 1 VIEW COMPARISON:  None Available. FINDINGS: Limited study secondary to numerous overlying radiopaque cardiac lead wires. There is no evidence of acute fracture or dislocation. Chronic changes are seen along the greater tubercle and inferior medial aspect of the right humeral. There are moderate severity degenerative changes. Soft tissues are unremarkable.  IMPRESSION: 1. Limited study without evidence of acute fracture or dislocation. 2. Moderate severity chronic and degenerative changes. Electronically Signed   By: Aram Candela M.D.   On: 05/13/2023 17:46   DG Pelvis Portable Result Date: 05/13/2023 CLINICAL DATA:  Status post trauma. EXAM: PORTABLE PELVIS 1-2 VIEWS COMPARISON:  March 05, 2016 FINDINGS: There is no evidence of pelvic fracture or diastasis. No pelvic bone lesions are seen. IMPRESSION: Negative. Electronically Signed   By: Aram Candela M.D.   On: 05/13/2023 17:45   DG Chest Port 1 View Result Date: 05/13/2023 CLINICAL DATA:  Status post trauma. EXAM: PORTABLE CHEST 1 VIEW COMPARISON:  December 17, 2022 FINDINGS: There is stable dual lead AICD positioning. The heart size and mediastinal contours are within normal limits. Marked severity calcification of the thoracic aorta is noted. There is no evidence of an acute infiltrate, pleural effusion or pneumothorax. Radiopaque surgical clips are seen overlying the esophageal hiatus. There is mild dextroscoliosis of the mid and lower thoracic spine with multilevel degenerative changes. IMPRESSION: No active cardiopulmonary disease. Electronically Signed   By: Aram Candela M.D.   On: 05/13/2023 17:44   CT CHEST ABDOMEN PELVIS W CONTRAST Result Date: 05/13/2023 CLINICAL DATA:  Found unresponsive, unknown down time EXAM: CT CHEST, ABDOMEN, AND PELVIS WITH CONTRAST CT THORACIC AND LUMBAR SPINE WITH CONTRAST TECHNIQUE: Multidetector CT imaging of the chest, abdomen and pelvis was performed following the standard protocol during bolus administration of intravenous contrast. Multidetector CT imaging of the thoracic and lumbar spine was performed following the standard protocol during bolus administration of intravenous contrast. RADIATION DOSE REDUCTION: This exam was performed according to the departmental dose-optimization program which includes automated exposure control, adjustment of the mA  and/or kV according to patient size and/or use of iterative reconstruction technique. CONTRAST:  75mL OMNIPAQUE IOHEXOL 350 MG/ML SOLN COMPARISON:  CT abdomen pelvis, 04/22/2014 FINDINGS: CT CHEST FINDINGS Cardiovascular: Left chest multi lead pacer. Aortic atherosclerosis. Normal heart size. Scattered left and right coronary artery calcifications. No pericardial effusion. Mediastinum/Nodes: No enlarged mediastinal, hilar, or axillary lymph nodes. Thyroid gland, trachea, and esophagus demonstrate no significant findings. Lungs/Pleura: Lungs are clear. No pleural effusion or pneumothorax. Musculoskeletal: No chest wall mass or suspicious osseous lesions identified. CT ABDOMEN PELVIS FINDINGS Hepatobiliary: No focal liver abnormality is seen. Status post cholecystectomy. Similar severe postoperative biliary ductal dilatation. Pancreas: Unremarkable. No pancreatic ductal dilatation or surrounding inflammatory changes. Spleen: Normal in size without significant abnormality. Adrenals/Urinary Tract: Adrenal glands are unremarkable. Kidneys are normal, without renal calculi, solid lesion, or hydronephrosis. Bladder is unremarkable. Stomach/Bowel: Stomach is within normal limits. Appendix not clearly visualized. No evidence of bowel wall thickening, distention, or inflammatory changes. Vascular/Lymphatic: Aortic atherosclerosis. 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 No enlarged abdominal or pelvic lymph nodes. Reproductive: No mass or other abnormality. Other: No abdominal wall hernia or abnormality. No ascites. Musculoskeletal: No acute osseous findings. CT THORACIC AND LUMBAR SPINE FINDINGS Alignment: Normal thoracic kyphosis. Normal lumbar lordosis. Vertebral bodies: No acute fracture or dislocation. Unchanged mild superior endplate wedge deformity of L2, with approximately  20% height loss. Disc spaces: Generally mild multilevel disc space height  loss and osteophytosis throughout the thoracic and lumbar spine, focally moderate at L3-L4. Paraspinous soft tissues: Unremarkable. IMPRESSION: 1. No CT evidence of acute traumatic injury to the chest, abdomen, or pelvis. 2. No acute fracture or dislocation of the thoracic or lumbar spine. Unchanged mild, chronic wedge deformity of L2. 3. Status post cholecystectomy. Similar severe postoperative biliary ductal dilatation. 4. Coronary artery disease. Aortic Atherosclerosis (ICD10-I70.0). Electronically Signed   By: Jearld Lesch M.D.   On: 05/13/2023 17:28   CT L-SPINE NO CHARGE Result Date: 05/13/2023 CLINICAL DATA:  Found unresponsive, unknown down time EXAM: CT CHEST, ABDOMEN, AND PELVIS WITH CONTRAST CT THORACIC AND LUMBAR SPINE WITH CONTRAST TECHNIQUE: Multidetector CT imaging of the chest, abdomen and pelvis was performed following the standard protocol during bolus administration of intravenous contrast. Multidetector CT imaging of the thoracic and lumbar spine was performed following the standard protocol during bolus administration of intravenous contrast. RADIATION DOSE REDUCTION: This exam was performed according to the departmental dose-optimization program which includes automated exposure control, adjustment of the mA and/or kV according to patient size and/or use of iterative reconstruction technique. CONTRAST:  75mL OMNIPAQUE IOHEXOL 350 MG/ML SOLN COMPARISON:  CT abdomen pelvis, 04/22/2014 FINDINGS: CT CHEST FINDINGS Cardiovascular: Left chest multi lead pacer. Aortic atherosclerosis. Normal heart size. Scattered left and right coronary artery calcifications. No pericardial effusion. Mediastinum/Nodes: No enlarged mediastinal, hilar, or axillary lymph nodes. Thyroid gland, trachea, and esophagus demonstrate no significant findings. Lungs/Pleura: Lungs are clear. No pleural effusion or pneumothorax. Musculoskeletal: No chest wall mass or suspicious osseous lesions identified. CT ABDOMEN PELVIS  FINDINGS Hepatobiliary: No focal liver abnormality is seen. Status post cholecystectomy. Similar severe postoperative biliary ductal dilatation. Pancreas: Unremarkable. No pancreatic ductal dilatation or surrounding inflammatory changes. Spleen: Normal in size without significant abnormality. Adrenals/Urinary Tract: Adrenal glands are unremarkable. Kidneys are normal, without renal calculi, solid lesion, or hydronephrosis. Bladder is unremarkable. Stomach/Bowel: Stomach is within normal limits. Appendix not clearly visualized. No evidence of bowel wall thickening, distention, or inflammatory changes. Vascular/Lymphatic: Aortic atherosclerosis. 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 No enlarged abdominal or pelvic lymph nodes. Reproductive: No mass or other abnormality. Other: No abdominal wall hernia or abnormality. No ascites. Musculoskeletal: No acute osseous findings. CT THORACIC AND LUMBAR SPINE FINDINGS Alignment: Normal thoracic kyphosis. Normal lumbar lordosis. Vertebral bodies: No acute fracture or dislocation. Unchanged mild superior endplate wedge deformity of L2, with approximately 20% height loss. Disc spaces: Generally mild multilevel disc space height loss and osteophytosis throughout the thoracic and lumbar spine, focally moderate at L3-L4. Paraspinous soft tissues: Unremarkable. IMPRESSION: 1. No CT evidence of acute traumatic injury to the chest, abdomen, or pelvis. 2. No acute fracture or dislocation of the thoracic or lumbar spine. Unchanged mild, chronic wedge deformity of L2. 3. Status post cholecystectomy. Similar severe postoperative biliary ductal dilatation. 4. Coronary artery disease. Aortic Atherosclerosis (ICD10-I70.0). Electronically Signed   By: Jearld Lesch M.D.   On: 05/13/2023 17:28   CT T-SPINE NO CHARGE Result Date: 05/13/2023 CLINICAL DATA:  Found unresponsive, unknown down time EXAM: CT CHEST, ABDOMEN, AND  PELVIS WITH CONTRAST CT THORACIC AND LUMBAR SPINE WITH CONTRAST TECHNIQUE: Multidetector CT imaging of the chest, abdomen and pelvis was performed following the standard protocol during bolus administration of intravenous contrast. Multidetector CT imaging of the thoracic and lumbar spine was performed following the standard protocol during bolus administration of  intravenous contrast. RADIATION DOSE REDUCTION: This exam was performed according to the departmental dose-optimization program which includes automated exposure control, adjustment of the mA and/or kV according to patient size and/or use of iterative reconstruction technique. CONTRAST:  75mL OMNIPAQUE IOHEXOL 350 MG/ML SOLN COMPARISON:  CT abdomen pelvis, 04/22/2014 FINDINGS: CT CHEST FINDINGS Cardiovascular: Left chest multi lead pacer. Aortic atherosclerosis. Normal heart size. Scattered left and right coronary artery calcifications. No pericardial effusion. Mediastinum/Nodes: No enlarged mediastinal, hilar, or axillary lymph nodes. Thyroid gland, trachea, and esophagus demonstrate no significant findings. Lungs/Pleura: Lungs are clear. No pleural effusion or pneumothorax. Musculoskeletal: No chest wall mass or suspicious osseous lesions identified. CT ABDOMEN PELVIS FINDINGS Hepatobiliary: No focal liver abnormality is seen. Status post cholecystectomy. Similar severe postoperative biliary ductal dilatation. Pancreas: Unremarkable. No pancreatic ductal dilatation or surrounding inflammatory changes. Spleen: Normal in size without significant abnormality. Adrenals/Urinary Tract: Adrenal glands are unremarkable. Kidneys are normal, without renal calculi, solid lesion, or hydronephrosis. Bladder is unremarkable. Stomach/Bowel: Stomach is within normal limits. Appendix not clearly visualized. No evidence of bowel wall thickening, distention, or inflammatory changes. Vascular/Lymphatic: Aortic atherosclerosis. Unchanged, densely calcified aneurysm of a  distal splenic artery branch measuring 0.9 cm (series 3, image 64). No enlarged abdominal or pelvic lymph nodes. Reproductive: No mass or other abnormality. Other: No abdominal wall hernia or abnormality. No ascites. Musculoskeletal: No acute osseous findings. CT THORACIC AND LUMBAR SPINE FINDINGS Alignment: Normal thoracic kyphosis. Normal lumbar lordosis. Vertebral bodies: No acute fracture or dislocation. Unchanged mild superior endplate wedge deformity of L2, with approximately 20% height loss. Disc spaces: Generally mild multilevel disc space height loss and osteophytosis throughout the thoracic and lumbar spine, focally moderate at L3-L4. Paraspinous soft tissues: Unremarkable. IMPRESSION: 1. No CT evidence of acute traumatic injury to the chest, abdomen, or pelvis. 2. No acute fracture or dislocation of the thoracic or lumbar spine. Unchanged mild, chronic wedge deformity of L2. 3. Status post cholecystectomy. Similar severe postoperative biliary ductal dilatation. 4. Coronary artery disease. Aortic Atherosclerosis (ICD10-I70.0). Electronically Signed   By: Jearld Lesch M.D.   On: 05/13/2023 17:28   CT HEAD WO CONTRAST Result Date: 05/13/2023 CLINICAL DATA:  Found unresponsive, unknown downtime EXAM: CT HEAD WITHOUT CONTRAST CT MAXILLOFACIAL WITHOUT CONTRAST CT CERVICAL SPINE WITHOUT CONTRAST TECHNIQUE: Multidetector CT imaging of the head, cervical spine, and maxillofacial structures were performed using the standard protocol without intravenous contrast. Multiplanar CT image reconstructions of the cervical spine and maxillofacial structures were also generated. RADIATION DOSE REDUCTION: This exam was performed according to the departmental dose-optimization program which includes automated exposure control, adjustment of the mA and/or kV according to patient size and/or use of iterative reconstruction technique. COMPARISON:  04/10/2023 FINDINGS: CT HEAD FINDINGS Brain: No evidence of acute infarction,  hemorrhage, hydrocephalus, extra-axial collection or mass lesion/mass effect. Mild periventricular and deep white matter hypodensity. Unchanged left occipital encephalomalacia. Vascular: No hyperdense vessel or unexpected calcification. CT FACIAL BONES FINDINGS Skull: Normal. Negative for fracture or focal lesion. Facial bones: No displaced fractures or dislocations. Sinuses/Orbits: Small air-fluid level in the right maxillary sinus. No overlying fracture. Other: Patient is partially edentulous with erosion of the left maxillary alveolar bone into the left maxillary sinus (series 7, image 31). CT CERVICAL SPINE FINDINGS Alignment: Normal. Skull base and vertebrae: No acute fracture. No primary bone lesion or focal pathologic process. Soft tissues and spinal canal: No prevertebral fluid or swelling. No visible canal hematoma. Disc levels: Focally moderate disc space height loss and osteophytosis of the lower cervical  levels C5-C7 Upper chest: Negative. Other: None. IMPRESSION: 1. No acute intracranial pathology. Small-vessel white matter disease and unchanged left occipital encephalomalacia. 2. No displaced fractures or dislocations of the facial bones. 3. Small air-fluid level in the right maxillary sinus. No overlying fracture. Patient is partially edentulous with erosion of the left maxillary alveolar bone into the left maxillary sinus. Correlate for sinusitis. 4. No fracture or static subluxation of the cervical spine. 5. Focally moderate disc space height loss and osteophytosis of the lower cervical levels. Electronically Signed   By: Jearld Lesch M.D.   On: 05/13/2023 17:20   CT CERVICAL SPINE WO CONTRAST Result Date: 05/13/2023 CLINICAL DATA:  Found unresponsive, unknown downtime EXAM: CT HEAD WITHOUT CONTRAST CT MAXILLOFACIAL WITHOUT CONTRAST CT CERVICAL SPINE WITHOUT CONTRAST TECHNIQUE: Multidetector CT imaging of the head, cervical spine, and maxillofacial structures were performed using the standard  protocol without intravenous contrast. Multiplanar CT image reconstructions of the cervical spine and maxillofacial structures were also generated. RADIATION DOSE REDUCTION: This exam was performed according to the departmental dose-optimization program which includes automated exposure control, adjustment of the mA and/or kV according to patient size and/or use of iterative reconstruction technique. COMPARISON:  04/10/2023 FINDINGS: CT HEAD FINDINGS Brain: No evidence of acute infarction, hemorrhage, hydrocephalus, extra-axial collection or mass lesion/mass effect. Mild periventricular and deep white matter hypodensity. Unchanged left occipital encephalomalacia. Vascular: No hyperdense vessel or unexpected calcification. CT FACIAL BONES FINDINGS Skull: Normal. Negative for fracture or focal lesion. Facial bones: No displaced fractures or dislocations. Sinuses/Orbits: Small air-fluid level in the right maxillary sinus. No overlying fracture. Other: Patient is partially edentulous with erosion of the left maxillary alveolar bone into the left maxillary sinus (series 7, image 31). CT CERVICAL SPINE FINDINGS Alignment: Normal. Skull base and vertebrae: No acute fracture. No primary bone lesion or focal pathologic process. Soft tissues and spinal canal: No prevertebral fluid or swelling. No visible canal hematoma. Disc levels: Focally moderate disc space height loss and osteophytosis of the lower cervical levels C5-C7 Upper chest: Negative. Other: None. IMPRESSION: 1. No acute intracranial pathology. Small-vessel white matter disease and unchanged left occipital encephalomalacia. 2. No displaced fractures or dislocations of the facial bones. 3. Small air-fluid level in the right maxillary sinus. No overlying fracture. Patient is partially edentulous with erosion of the left maxillary alveolar bone into the left maxillary sinus. Correlate for sinusitis. 4. No fracture or static subluxation of the cervical spine. 5.  Focally moderate disc space height loss and osteophytosis of the lower cervical levels. Electronically Signed   By: Jearld Lesch M.D.   On: 05/13/2023 17:20   CT MAXILLOFACIAL WO CONTRAST Result Date: 05/13/2023 CLINICAL DATA:  Found unresponsive, unknown downtime EXAM: CT HEAD WITHOUT CONTRAST CT MAXILLOFACIAL WITHOUT CONTRAST CT CERVICAL SPINE WITHOUT CONTRAST TECHNIQUE: Multidetector CT imaging of the head, cervical spine, and maxillofacial structures were performed using the standard protocol without intravenous contrast. Multiplanar CT image reconstructions of the cervical spine and maxillofacial structures were also generated. RADIATION DOSE REDUCTION: This exam was performed according to the departmental dose-optimization program which includes automated exposure control, adjustment of the mA and/or kV according to patient size and/or use of iterative reconstruction technique. COMPARISON:  04/10/2023 FINDINGS: CT HEAD FINDINGS Brain: No evidence of acute infarction, hemorrhage, hydrocephalus, extra-axial collection or mass lesion/mass effect. Mild periventricular and deep white matter hypodensity. Unchanged left occipital encephalomalacia. Vascular: No hyperdense vessel or unexpected calcification. CT FACIAL BONES FINDINGS Skull: Normal. Negative for fracture or focal lesion. Facial bones: No  displaced fractures or dislocations. Sinuses/Orbits: Small air-fluid level in the right maxillary sinus. No overlying fracture. Other: Patient is partially edentulous with erosion of the left maxillary alveolar bone into the left maxillary sinus (series 7, image 31). CT CERVICAL SPINE FINDINGS Alignment: Normal. Skull base and vertebrae: No acute fracture. No primary bone lesion or focal pathologic process. Soft tissues and spinal canal: No prevertebral fluid or swelling. No visible canal hematoma. Disc levels: Focally moderate disc space height loss and osteophytosis of the lower cervical levels C5-C7 Upper chest:  Negative. Other: None. IMPRESSION: 1. No acute intracranial pathology. Small-vessel white matter disease and unchanged left occipital encephalomalacia. 2. No displaced fractures or dislocations of the facial bones. 3. Small air-fluid level in the right maxillary sinus. No overlying fracture. Patient is partially edentulous with erosion of the left maxillary alveolar bone into the left maxillary sinus. Correlate for sinusitis. 4. No fracture or static subluxation of the cervical spine. 5. Focally moderate disc space height loss and osteophytosis of the lower cervical levels. Electronically Signed   By: Jearld Lesch M.D.   On: 05/13/2023 17:20    Labs:  CBC: Recent Labs    03/24/23 1315 04/10/23 1752 05/13/23 1518 05/13/23 1526 05/13/23 1857 05/14/23 0155 05/14/23 0455  WBC 7.1 6.0 14.0*  --   --   --  10.6*  HGB 12.1 12.6 15.6* 16.7* 15.3* 13.6 13.1  HCT 39.9 41.1 49.2* 49.0* 45.0 40.0 41.4  PLT 221 203 206  --   --   --  170    COAGS: Recent Labs    05/13/23 1518 05/14/23 0455  INR 1.2 1.2  APTT  --  26    BMP: Recent Labs    03/24/23 1320 04/10/23 1752 05/13/23 1518 05/13/23 1526 05/13/23 1857 05/14/23 0155 05/14/23 0455  NA 139 139 141 143 144 140 142  K 4.3 5.1 4.1 4.1 3.8 3.3* 3.7  CL 104 107 109 113*  --   --  108  CO2 24 21* 17*  --   --   --  19*  GLUCOSE 91 106* 138* 135*  --   --  145*  BUN 35* 34* 29* 31*  --   --  31*  CALCIUM 9.1 9.1 9.5  --   --   --  9.0  CREATININE 1.42* 1.13* 1.24* 1.10*  --   --  1.19*  GFRNONAA 35* 46* 41*  --   --   --  43*    LIVER FUNCTION TESTS: Recent Labs    02/03/23 1517 03/24/23 1320 05/13/23 1518 05/14/23 0455  BILITOT 0.4 0.6 1.3* 0.7  AST 15 16 74* 43*  ALT 13 21 50* 41  ALKPHOS 78 90 98 79  PROT 6.6 6.9 6.8 5.8*  ALBUMIN 4.0 4.2 3.8 3.2*    Assessment and Plan:  Occluded distal left middle cerebral artery M1 segment s/p thrombectomy with TICI 3 revascularization by Dr. Corliss Skains 05/14/23  Post  procedure CT showed no evidence of hemorrhagic complications. Patient was left intubated due to her medical condition prior to intubation. Patient evaluated by Dr. Corliss Skains this morning. She remains intubated but is weaning.   MRA head/MR Brain pending.   Plans per Neurology/Critical Care.   Please call NIR with any questions.   Electronically Signed: Alwyn Ren, AGACNP-BC 05/14/2023, 9:01 AM   I spent a total of 15 Minutes at the the patient's bedside AND on the patient's hospital floor or unit, greater than 50% of which was counseling/coordinating care  for Left MCA occlusion

## 2023-05-14 NOTE — Progress Notes (Signed)
 eLink Physician-Brief Progress Note Patient Name: LAM BJORKLUND DOB: Apr 18, 1928 MRN: [id number]   Date of Service  05/14/2023  HPI/Events of Note  19 F HFpEF, afib/flutter on rivaroxaban, s/p PPM, CKD, autoimmune hepatitis with cirrhosis. Brought in after being found down on a wellness check as she had missed her doctor's appointment. She was in afib RVR and had a UTI on work up. CTA head with stenotic/occluded left M1 MCA underwent thrombectomy c/o IR.  eICU Interventions  CVA s/p thrombectomy Afib RVR on amiodarone and diltiazem drip UTI on ceftriaxone     Intervention Category Evaluation Type: New Patient Evaluation  Rosalie Gums Ande Therrell 05/14/2023, 2:03 AM

## 2023-05-15 ENCOUNTER — Inpatient Hospital Stay (HOSPITAL_COMMUNITY)

## 2023-05-15 DIAGNOSIS — Z950 Presence of cardiac pacemaker: Secondary | ICD-10-CM | POA: Diagnosis not present

## 2023-05-15 DIAGNOSIS — I484 Atypical atrial flutter: Secondary | ICD-10-CM

## 2023-05-15 DIAGNOSIS — I63412 Cerebral infarction due to embolism of left middle cerebral artery: Secondary | ICD-10-CM

## 2023-05-15 DIAGNOSIS — G9341 Metabolic encephalopathy: Secondary | ICD-10-CM | POA: Diagnosis not present

## 2023-05-15 DIAGNOSIS — I5032 Chronic diastolic (congestive) heart failure: Secondary | ICD-10-CM | POA: Diagnosis not present

## 2023-05-15 DIAGNOSIS — N179 Acute kidney failure, unspecified: Secondary | ICD-10-CM | POA: Diagnosis not present

## 2023-05-15 DIAGNOSIS — N390 Urinary tract infection, site not specified: Secondary | ICD-10-CM

## 2023-05-15 DIAGNOSIS — N189 Chronic kidney disease, unspecified: Secondary | ICD-10-CM

## 2023-05-15 LAB — CBC
HCT: 34.2 % — ABNORMAL LOW (ref 36.0–46.0)
Hemoglobin: 10.7 g/dL — ABNORMAL LOW (ref 12.0–15.0)
MCH: 31.2 pg (ref 26.0–34.0)
MCHC: 31.3 g/dL (ref 30.0–36.0)
MCV: 99.7 fL (ref 80.0–100.0)
Platelets: 155 10*3/uL (ref 150–400)
RBC: 3.43 MIL/uL — ABNORMAL LOW (ref 3.87–5.11)
RDW: 18.6 % — ABNORMAL HIGH (ref 11.5–15.5)
WBC: 8.9 10*3/uL (ref 4.0–10.5)
nRBC: 0 % (ref 0.0–0.2)

## 2023-05-15 LAB — COMPREHENSIVE METABOLIC PANEL
ALT: 37 U/L (ref 0–44)
AST: 33 U/L (ref 15–41)
Albumin: 3.5 g/dL (ref 3.5–5.0)
Alkaline Phosphatase: 70 U/L (ref 38–126)
Anion gap: 13 (ref 5–15)
BUN: 35 mg/dL — ABNORMAL HIGH (ref 8–23)
CO2: 15 mmol/L — ABNORMAL LOW (ref 22–32)
Calcium: 8.6 mg/dL — ABNORMAL LOW (ref 8.9–10.3)
Chloride: 112 mmol/L — ABNORMAL HIGH (ref 98–111)
Creatinine, Ser: 2.34 mg/dL — ABNORMAL HIGH (ref 0.44–1.00)
GFR, Estimated: 19 mL/min — ABNORMAL LOW (ref >60)
Glucose, Bld: 91 mg/dL (ref 70–99)
Potassium: 4.1 mmol/L (ref 3.5–5.1)
Sodium: 140 mmol/L (ref 135–145)
Total Bilirubin: 0.6 mg/dL (ref 0.0–1.2)
Total Protein: 5.7 g/dL — ABNORMAL LOW (ref 6.5–8.1)

## 2023-05-15 LAB — URINALYSIS, COMPLETE (UACMP) WITH MICROSCOPIC
Bilirubin Urine: NEGATIVE
Glucose, UA: NEGATIVE mg/dL
Ketones, ur: NEGATIVE mg/dL
Nitrite: NEGATIVE
Protein, ur: 100 mg/dL — AB
Specific Gravity, Urine: >1.046 — ABNORMAL HIGH (ref 1.005–1.030)
pH: 5 (ref 5.0–8.0)

## 2023-05-15 LAB — SODIUM, URINE, RANDOM: Sodium, Ur: 17 mmol/L

## 2023-05-15 LAB — GLUCOSE, CAPILLARY
Glucose-Capillary: 101 mg/dL — ABNORMAL HIGH (ref 70–99)
Glucose-Capillary: 99 mg/dL (ref 70–99)
Glucose-Capillary: 97 mg/dL (ref 70–99)
Glucose-Capillary: 98 mg/dL (ref 70–99)
Glucose-Capillary: 96 mg/dL (ref 70–99)

## 2023-05-15 LAB — CREATININE, URINE, RANDOM: Creatinine, Urine: 97 mg/dL

## 2023-05-15 LAB — LACTIC ACID, PLASMA
Lactic Acid, Venous: 1.4 mmol/L (ref 0.5–1.9)
Lactic Acid, Venous: 1.6 mmol/L (ref 0.5–1.9)

## 2023-05-15 LAB — CK: Total CK: 311 U/L — ABNORMAL HIGH (ref 38–234)

## 2023-05-15 MED ORDER — BUDESONIDE 0.25 MG/2ML IN SUSP
0.2500 mg | Freq: Two times a day (BID) | RESPIRATORY_TRACT | Status: DC
  Administered 2023-05-15 – 2023-05-22 (×13): 0.25 mg via RESPIRATORY_TRACT
  Filled 2023-05-15 (×13): qty 2

## 2023-05-15 MED ORDER — SODIUM CHLORIDE 0.9 % IV SOLN: INTRAVENOUS | Status: DC

## 2023-05-15 MED ORDER — ARFORMOTEROL TARTRATE 15 MCG/2ML IN NEBU
15.0000 ug | INHALATION_SOLUTION | Freq: Two times a day (BID) | RESPIRATORY_TRACT | Status: DC
  Administered 2023-05-15 – 2023-05-22 (×13): 15 ug via RESPIRATORY_TRACT
  Filled 2023-05-15 (×13): qty 2

## 2023-05-15 MED ORDER — LEVALBUTEROL HCL 0.63 MG/3ML IN NEBU
0.6300 mg | INHALATION_SOLUTION | Freq: Four times a day (QID) | RESPIRATORY_TRACT | Status: DC
  Administered 2023-05-15 – 2023-05-16 (×5): 0.63 mg via RESPIRATORY_TRACT
  Filled 2023-05-15 (×6): qty 3

## 2023-05-15 MED ORDER — IPRATROPIUM BROMIDE 0.02 % IN SOLN
0.5000 mg | Freq: Four times a day (QID) | RESPIRATORY_TRACT | Status: DC
  Administered 2023-05-15 – 2023-05-16 (×6): 0.5 mg via RESPIRATORY_TRACT
  Filled 2023-05-15 (×6): qty 2.5

## 2023-05-15 MED ORDER — HEPARIN SODIUM (PORCINE) 5000 UNIT/ML IJ SOLN
5000.0000 [IU] | Freq: Three times a day (TID) | INTRAMUSCULAR | Status: DC
  Administered 2023-05-16: 5000 [IU] via SUBCUTANEOUS
  Filled 2023-05-15: qty 1

## 2023-05-15 NOTE — Progress Notes (Signed)
 Attempted ECHO 2x. Patient in chair taking with MD. Patient getting patient care.  Dondra Prader RVT RCS

## 2023-05-15 NOTE — Hospital Course (Addendum)
 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  And found to have acute left MCA scattered infarcts likely embolic event given anticoagulation.  She is now status post revascularization of her left M1 occlusion with a TICI 3.  She was intubated for her interventional radiology procedure and revascularization and brought to the ICU.  She was then subsequently extubated transferred to the [facility] service on 05/15/2023.  She continued to have global aphasia and hospitalization has been complicated by worsening AKI so Nephro was consulted.  Initially she was given IV fluids and then this was stopped and she is given diuresis given that she went into volume overload acute on chronic CHF as well as acute respiratory failure with hypoxia requiring higher supplemental oxygen. Given her inability swallow, Cortrak was placed now and Meds are being adjusted. Nephrology recommended high-dose IV Lasix but despite this his renal function continue to worsen but subsequently now starting to improve.  Diuresis is now discontinued due to elevated BUN and HypoNa+.  Palliative care consulted for GOC discussion being had and she is now DNR/DNI extremely poor prognosis.  After further goals of care discussion today we will NOT repeat Labs as family is slowly transitioning their main goal for the patient's comfort.  Currently family still wants the Cortrak in place for now  Assessment and Plan:  Acute left MCA scattered infarcts which is likely embolic to an A-fib event even on anticoagulation status post revascularization TICI 3: She is status post IR left M1 occlusion with a tiki 3 and MRI showed multifocal acute infarcts throughout the left MCA territory.  MRI revealed distal M1 MCA stenosis  versus occlusion which is now patent TTE has been done Neuro recommended po Apixaban and now getting through Cortrak. Stroke team Signed off. Maintain neuroprotective measures with goal for euthymia, euglycemia, natremia normoxia and appears CO2 of 35-40 -Palliative care consulted for goals of care discussion given worsening and now patient is DNR/DNI and Will not repeat Labs due to GOC Palliative discussions   A flutter/Atrial Fibrillation with RVR/CAD/permanent pacemaker: -Changed IV Heparin to po Apixaban 2.5 mg BID and continuing Diltiazem 60 mg every 8 hours for rate control and consolidating when able and also recommending Metoprolol Tartrate 50 mg p.o. twice daily.  Cardiology has no further current recommendations and are going to follow peripherally.  She continues have a flutter with a 4-1 AV block and has a permanent pacemaker as well  Acute on Chronic diastolic heart failure with preserved ejection fraction: BNP went from 673.8 -> 1,268.9. IVF discontinued and she is s/p IV Lasix which is also D/C'd. Strict I's and O's and daily weights is +2.748 L since admission.   AKI on CKD Stage 3b, worsening further Metabolic Acidosis Hypernatremia, worsening  -BUN/Cr Trend went from 31/1.19 and peaked at 101/4.80; On 05/20/23 it is 121/4.51; S/p IVF and Diuresis now (Both stopped) -Patient's Metabolic Acidosis shows a CO2 of 27, anion gap of 16, chloride level 108 -Na+ is now trending up: 140 -> 143 -> 147 -> 149 -> 151. Avoid Nephrotoxic Medications, Contrast Dyes, Hypotension and Dehydration to Ensure Adequate Renal Perfusion and will need to Renally Adjust Meds -Nephrology consulted for further evaluation and given her progressive hypoxia they were continuing her IV Lasix dosing with 160 mg twice Daily  however given that her Na+ and BUN were worsening,  Nephro discontinued IV Lasix. They do not feel any strong indication for RRT and feel that she is a very poor candidate and recommended against  dialysis. -Palliative care has been consulted for further goals of care discussion given her worsening renal function and poor prognosis.  They have evaluated the patient and patient is now DNR/DNI. Will not repeat Labs due to GOC Palliative discussions   Rhabdomyolysis: Improved. CK 327 on last check.  Discontinued IVF and IV Lasix. Will not repeat Labs due to GOC Palliative discussions  Normocytic Anemia: Hgb/Hctis now to be stable at 10.6/33.7. CTM for S/Sx of bleeding; no overt bleeding noted but she was FOBT positive on admission. Will not repeat Labs due to GOC Palliative discussions  Thrombocytopenia: Plts Dropped as low as 138 but has now recovered and is 203 today. CTM for S/Sx of Bleeding; Will not repeat Labs due to GOC Palliative discussions  Sepsis Secondary to Enterobacter Cloacae UTI: S/p IV Ceftriaxone course. Leukocytosis has resolved and is 9.8 and urinalysis is improving  Acute Respiratory Failure with Hypoxia: Was vented postoperatively and was weaned and now started on Xopenex and Atrovent.the oxygen requirements worsened given her volume overload and lack of urine output and current O2 settings are: SpO2: 100 %; O2 Flow Rate (L/min): 5 L/min, FiO2 (%): 30 %  -Wearing supplemental oxygen via nasal cannula and was IV Lasix her volume overload but IV Diuresis stopped due to HyperNa+ -Continue breathing treatments for comfort  Hypothyroidism -TSH is elevated at 8.301 but free T4 is within normal limits at 0.65; resumed home Levothyroxine 137 mcg p.o. via the Cortrak  Hypoalbuminemia: Patient's Albumin Trend Was 2.9 today. Will not repeat Labs due to GOC Palliative discussions  Malnutrition of Moderate Degree in the context of Chronic illness Nutrition Status: Nutrition Problem: Moderate Malnutrition Etiology: chronic illness Signs/Symptoms: moderate fat depletion, moderate muscle depletion Interventions: Tube feeding

## 2023-05-15 NOTE — Evaluation (Signed)
 Speech Language Pathology Evaluation Patient Details Name: Ruth Roth MRN: [id number] DOB: 09-Sep-1928 Today's Date: 05/15/2023 Time: 0942-1000 SLP Time Calculation (min) (ACUTE ONLY): 18 min  Problem List:  Patient Active Problem List   Diagnosis Date Noted   Middle cerebral artery embolism, left 05/14/2023   Endotracheal tube present 05/14/2023   Atrial flutter (HCC) 05/13/2023   Ventricular tachycardia (HCC) 05/13/2023   Permanent atrial fibrillation s/p pacemaker (HCC) 05/13/2023   Elevated troponin 05/13/2023   Sepsis secondary to UTI (HCC) 05/13/2023   Acute metabolic encephalopathy 05/13/2023   Rhabdomyolysis 05/13/2023   Acute kidney injury superimposed on stage 3b chronic kidney disease (HCC) 05/13/2023   Metabolic acidosis 05/13/2023   History of CAD (coronary artery disease) 05/13/2023   Mitral valvular insufficiency and aortic valvular insufficiency 05/13/2023   Acute cystitis 05/13/2023   Reactive airway disease 05/13/2023   Stroke (cerebrum) (HCC) 05/13/2023   Fall at home, initial encounter 04/13/2023   Bronchitis 12/19/2022   B12 deficiency 09/23/2022   Dysuria 07/20/2022   Nasal congestion 06/16/2022   Headache 06/13/2022   Chronic liver disease and cirrhosis (HCC) 04/03/2022   Secondary hypercoagulable state (HCC) 01/23/2022   Tachy-brady syndrome (HCC) 12/12/2021   Rib pain on right side 08/23/2021   UTI (urinary tract infection) 08/12/2021   Shingles 07/11/2021   Left knee pain 05/21/2021   Bilateral hand swelling 09/09/2019   Bilateral hand pain 09/09/2019   SOB (shortness of breath) on exertion 07/13/2019   Chronic low back pain (Primary Area of Pain) (Bilateral) (L>R) w/o sciatica 03/17/2019   Chronic hip pain (Secondary area of Pain) (Bilateral) (R>L) 03/17/2019   Chronic lower extremity pain (Bilateral) 03/17/2019   Chronic anticoagulation (Xarelto) 03/17/2019   Lumbar spinal stenosis (L3-4, L4-5) w/ neurogenic claudication 03/17/2019   Abnormal  MRI, lumbar spine 03/17/2019   Lumbar foraminal stenosis (L5-S1) (Bilateral) 03/17/2019   Lumbar facet hypertrophy 03/17/2019   Lumbar facet syndrome (Bilateral) 03/17/2019   Chronic pain syndrome 03/16/2019   Pharmacologic therapy 03/16/2019   Disorder of skeletal system 03/16/2019   Problems influencing health status 03/16/2019   Sinusitis 02/20/2019   CKD (chronic kidney disease) stage 3, GFR 30-59 ml/min (HCC) 07/26/2018   Cough 05/24/2018   Primary biliary cholangitis (HCC) 05/24/2018   Iron deficiency anemia 02/25/2018   Persistent atrial fibrillation (HCC) 11/12/2017   Severe mitral regurgitation 11/12/2017   Pulmonary hypertension, unspecified (HCC) 11/12/2017   Peripheral neuropathy 11/12/2017   Itching 10/05/2017   Acute diastolic CHF (congestive heart failure) (HCC) 08/21/2017   Dizziness 08/09/2016   Back pain 05/19/2016   Abnormal CXR 10/02/2014   Benign meningioma (HCC) 09/20/2014   Chronic diastolic CHF (congestive heart failure) (HCC) 05/24/2014   Atherosclerotic heart disease of native coronary artery with other forms of angina pectoris (HCC)    Dyspnea 05/09/2014   Renal insufficiency 05/06/2014   Palpitations 04/17/2014   Health care maintenance 04/17/2014   Anemia due to chronic kidney disease 06/26/2013   Onychomycosis due to dermatophyte 06/26/2013   Exostosis 06/26/2013   Gastritis and gastroduodenitis 11/04/2012   GERD (gastroesophageal reflux disease) 10/18/2012   Autoimmune hepatitis (HCC) 01/20/2012   Osteoporosis 01/20/2012   Benign hypertension 01/16/2012   Hypercholesteremia 01/16/2012   Hyperlipidemia 01/16/2012   Hypothyroidism 04/30/2011   Thyroiditis, lymphocytic 04/30/2011   Past Medical History:  Past Medical History:  Diagnosis Date   (HFpEF) heart failure with preserved ejection fraction (HCC)    a. 07/2017 Echo: EF 50-55%, no rwma, mild AS/AI, mod MR, mod dil LA,  mod TR, PASP ; b. 03/2021 Echo: EF 60-65%. No rwma. Nl RV fxn. Sev  BAE. Mild MR. Mild AI/AS.   Atrial fibrillation (HCC)    Autoimmune hepatitis (HCC)    followed by Dr Marva Panda   CHF (congestive heart failure) [facility])    Fibrocystic breast disease    Glaucoma    Hypercholesterolemia    Hypertension    Hypothyroidism    Inflammatory arthritis    MI (myocardial infarction) (HCC)    Mitral regurgitation    a. 07/2017 Echo: Mod MR; b. 08/2017 Cath: 4+/Severe MR; c. 08/2017 TEE: Mild MR; d. 03/2021 Echo: Mild MR.   Neuropathy    Non-obstructive Coronary artery disease    a. 05/2014 NSTEMI/Cath: LM nl, LAD mild dzs, LCX 60ost hazy (? culprit), RCA mild dzs. EF 60% by echo-->Med Rx; b. 08/2017 Cath: LM 30ost, LAD min irregs, LCX small, 40ost/p, RCA large, min irregs, RPDA/RPL1 nl, EF 50-55%. 4+MR.   Osteoarthritis    Permanent atrial fibrillation (HCC)    a. CHA2DS2VASc = 6-->Xarelto.   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   Past Surgical History:  Past Surgical History:  Procedure Laterality Date   ABDOMINAL HYSTERECTOMY  1963   partial, secondary to fibroids   APPENDECTOMY     Billroth     CARDIAC CATHETERIZATION  05/12/14   [facility]   CARDIOVERSION N/A 12/26/2016   Procedure: CARDIOVERSION;  Surgeon: Iran Ouch, MD;  Location: [facility] ORS;  Service: Cardiovascular;  Laterality: N/A;   CARDIOVERSION N/A 05/16/2017   Procedure: CARDIOVERSION;  Surgeon: Iran Ouch, MD;  Location: [facility] ORS;  Service: Cardiovascular;  Laterality: N/A;   CARDIOVERSION N/A 06/09/2017   Procedure: CARDIOVERSION;  Surgeon: Iran Ouch, MD;  Location: [facility] ORS;  Service: Cardiovascular;  Laterality: N/A;   CARDIOVERSION N/A 11/30/2018   Procedure: CARDIOVERSION;  Surgeon: Iran Ouch, MD;  Location: [facility] ORS;  Service: Cardiovascular;  Laterality: N/A;   CHOLECYSTECTOMY  1998   COLONOSCOPY  2009   IR CT HEAD LTD  05/14/2023   IR PERCUTANEOUS ART THROMBECTOMY/INFUSION INTRACRANIAL INC DIAG ANGIO  05/14/2023    PACEMAKER IMPLANT N/A 09/28/2021   Procedure: PACEMAKER IMPLANT;  Surgeon: Lanier Prude, MD;  Location: [facility] INVASIVE CV LAB;  Service: Cardiovascular;  Laterality: N/A;   RADIOLOGY WITH ANESTHESIA N/A 05/13/2023   Procedure: RADIOLOGY WITH ANESTHESIA;  Surgeon: Radiologist, Medication, MD;  Location: [facility] OR;  Service: Radiology;  Laterality: N/A;   RIGHT/LEFT HEART CATH AND CORONARY ANGIOGRAPHY Bilateral 08/21/2017   Procedure: RIGHT/LEFT HEART CATH AND CORONARY ANGIOGRAPHY;  Surgeon: Iran Ouch, MD;  Location: [facility] INVASIVE CV LAB;  Service: Cardiovascular;  Laterality: Bilateral;   TEE WITHOUT CARDIOVERSION N/A 08/25/2017   Procedure: TRANSESOPHAGEAL ECHOCARDIOGRAM (TEE);  Surgeon: Dolores Patty, MD;  Location: [facility] ENDOSCOPY;  Service: Cardiovascular;  Laterality: N/A;   UPPER GI ENDOSCOPY  2004   HPI:  KARENE BRACKEN is a 89 y.o. female who presented to the ED from home on 05/14/23 after being found down at home, awake but verbally unresponsive and with right sided weakness. Dx L CVA. S/P thrombectomy 3/5. Intubated 3/5-3/5. MRI: Many acute Infarcts throughout the left MCA territory.  Associated edema without substantial mass effect. No midline shift.  Remote left PCA territory infarct. PMHx of HFpEF, pacemaker, atrial fibrillation (on Xarelto), Autoimmune hepatitis, Glaucoma, Hypercholesterolemia, Hypertension, Hypothyroidism, Inflammatory arthritis, CAD, Mitral regurgitation, Neuropathy, Osteoarthritis and PUD   Assessment / Plan / Recommendation  Clinical Impression  Pt presents with a global aphasia impacting all input and output systems of communication in equal degrees. She followed simple commands spontaneously <25 % of time.  She was successful with modeling/example (clinician squeezing hand to elicit hand squeeze) only occasionally.  There were no attempts to discriminate among people or common objects.  There were no attempts to communicate verbally; no intentional vocalizing to  make needs known or respond to family.  There were occasional facial expressions in response to family members at bedside.  SLP will follow while admitted to address basic communication -e.g., vocalizing, following simple commands with max cues.  The nature of pt's aphasia and its severity was discussed with her son and grandson.    SLP Assessment  SLP Recommendation/Assessment: Patient needs continued Speech Lanaguage Pathology Services SLP Visit Diagnosis: Aphasia (R47.01)    Recommendations for follow up therapy are one component of a multi-disciplinary discharge planning process, led by the attending physician.  Recommendations may be updated based on patient status, additional functional criteria and insurance authorization.    Follow Up Recommendations  Other (comment) (tba)    Assistance Recommended at Discharge  Frequent or constant Supervision/Assistance  Functional Status Assessment Patient has had a recent decline in their functional status and demonstrates the ability to make significant improvements in function in a reasonable and predictable amount of time.  Frequency and Duration min 3x week  2 weeks      SLP Evaluation Cognition  Overall Cognitive Status: Impaired/Different from baseline Orientation Level: Other (comment) (global aphasia)       Comprehension  Auditory Comprehension Overall Auditory Comprehension: Impaired Commands: Impaired One Step Basic Commands: 0-24% accurate Visual Recognition/Discrimination Discrimination: Exceptions to [facility] Common Objects: Unable to indentify Reading Comprehension Reading Status: Not tested    Expression Expression Primary Mode of Expression: Verbal Verbal Expression Overall Verbal Expression: Impaired Initiation: Impaired Automatic Speech:  (none) Repetition: Impaired Level of Impairment:  (no verbal output) Common Objects: Unable to indentify Pragmatics: Impairment Written Expression Dominant Hand: Right Written  Expression: Not tested   Oral / Motor  Oral Motor/Sensory Function Overall Oral Motor/Sensory Function: Mild impairment Facial Symmetry: Abnormal symmetry right (decreased nasolabial fold on right) Motor Speech Overall Motor Speech:  (no verbal output)            [name] 05/15/2023, 10:36 AM Marchelle Folks L. Samson Frederic, MA CCC/SLP Clinical Specialist - Acute Care SLP Acute Rehabilitation Services Office number [phone]

## 2023-05-15 NOTE — Evaluation (Signed)
 Clinical/Bedside Swallow Evaluation Patient Details  Name: Ruth Roth MRN: [id number] Date of Birth: [birth year]  Today's Date: 05/15/2023 Time: SLP Start Time (ACUTE ONLY): 0932 SLP Stop Time (ACUTE ONLY): 0042 SLP Time Calculation (min) (ACUTE ONLY): 910 min  Past Medical History:  Past Medical History:  Diagnosis Date   (HFpEF) heart failure with preserved ejection fraction (HCC)    a. 07/2017 Echo: EF 50-55%, no rwma, mild AS/AI, mod MR, mod dil LA, mod TR, PASP ; b. 03/2021 Echo: EF 60-65%. No rwma. Nl RV fxn. Sev BAE. Mild MR. Mild AI/AS.   Atrial fibrillation (HCC)    Autoimmune hepatitis (HCC)    followed by Dr Marva Panda   CHF (congestive heart failure) [facility])    Fibrocystic breast disease    Glaucoma    Hypercholesterolemia    Hypertension    Hypothyroidism    Inflammatory arthritis    MI (myocardial infarction) (HCC)    Mitral regurgitation    a. 07/2017 Echo: Mod MR; b. 08/2017 Cath: 4+/Severe MR; c. 08/2017 TEE: Mild MR; d. 03/2021 Echo: Mild MR.   Neuropathy    Non-obstructive Coronary artery disease    a. 05/2014 NSTEMI/Cath: LM nl, LAD mild dzs, LCX 60ost hazy (? culprit), RCA mild dzs. EF 60% by echo-->Med Rx; b. 08/2017 Cath: LM 30ost, LAD min irregs, LCX small, 40ost/p, RCA large, min irregs, RPDA/RPL1 nl, EF 50-55%. 4+MR.   Osteoarthritis    Permanent atrial fibrillation (HCC)    a. CHA2DS2VASc = 6-->Xarelto.   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   Past Surgical History:  Past Surgical History:  Procedure Laterality Date   ABDOMINAL HYSTERECTOMY  1963   partial, secondary to fibroids   APPENDECTOMY     Billroth     CARDIAC CATHETERIZATION  05/12/14   [facility]   CARDIOVERSION N/A 12/26/2016   Procedure: CARDIOVERSION;  Surgeon: Iran Ouch, MD;  Location: [facility] ORS;  Service: Cardiovascular;  Laterality: N/A;   CARDIOVERSION N/A 05/16/2017   Procedure: CARDIOVERSION;  Surgeon: Iran Ouch, MD;   Location: [facility] ORS;  Service: Cardiovascular;  Laterality: N/A;   CARDIOVERSION N/A 06/09/2017   Procedure: CARDIOVERSION;  Surgeon: Iran Ouch, MD;  Location: [facility] ORS;  Service: Cardiovascular;  Laterality: N/A;   CARDIOVERSION N/A 11/30/2018   Procedure: CARDIOVERSION;  Surgeon: Iran Ouch, MD;  Location: [facility] ORS;  Service: Cardiovascular;  Laterality: N/A;   CHOLECYSTECTOMY  1998   COLONOSCOPY  2009   IR CT HEAD LTD  05/14/2023   IR PERCUTANEOUS ART THROMBECTOMY/INFUSION INTRACRANIAL INC DIAG ANGIO  05/14/2023   PACEMAKER IMPLANT N/A 09/28/2021   Procedure: PACEMAKER IMPLANT;  Surgeon: Lanier Prude, MD;  Location: [facility] INVASIVE CV LAB;  Service: Cardiovascular;  Laterality: N/A;   RADIOLOGY WITH ANESTHESIA N/A 05/13/2023   Procedure: RADIOLOGY WITH ANESTHESIA;  Surgeon: Radiologist, Medication, MD;  Location: [facility] OR;  Service: Radiology;  Laterality: N/A;   RIGHT/LEFT HEART CATH AND CORONARY ANGIOGRAPHY Bilateral 08/21/2017   Procedure: RIGHT/LEFT HEART CATH AND CORONARY ANGIOGRAPHY;  Surgeon: Iran Ouch, MD;  Location: [facility] INVASIVE CV LAB;  Service: Cardiovascular;  Laterality: Bilateral;   TEE WITHOUT CARDIOVERSION N/A 08/25/2017   Procedure: TRANSESOPHAGEAL ECHOCARDIOGRAM (TEE);  Surgeon: Dolores Patty, MD;  Location: [facility] ENDOSCOPY;  Service: Cardiovascular;  Laterality: N/A;   UPPER GI ENDOSCOPY  2004   HPI:  Ruth Roth is a 89 y.o. female who presented to the ED from home on 05/14/23  after being found down at home, awake but verbally unresponsive and with right sided weakness. Dx L CVA. S/P thrombectomy 3/5. Intubated 3/5-3/5. MRI: Many acute Infarcts throughout the left MCA territory.  Associated edema without substantial mass effect. No midline shift.  Remote left PCA territory infarct. PMHx of HFpEF, pacemaker, atrial fibrillation (on Xarelto), Autoimmune hepatitis, Glaucoma, Hypercholesterolemia, Hypertension, Hypothyroidism, Inflammatory arthritis, CAD, Mitral  regurgitation, Neuropathy, Osteoarthritis and PUD    Assessment / Plan / Recommendation  Clinical Impression  Pt participated in limited clinical swallowing assessment. She did not follow oral motor commands and was resistant to oral care, sealing her lips and turning her head from side-to-side across multiple attempts by myself and her grandson.  Provided with single ice chips and 1/2 teaspoon boluses of water in an effort to stimulate swallowing and create more opportunity for oral care.  There was consistent coughing after all PO trials.  She again refused oral suctioning to allow removal of secretions.  Difficult situation.  Her son and grandson would like to pursue temporary enteral feeding to allow for some improvements in the next few days.  Recommend NPO. Continue efforts at oral care and allow occasional ice chips throughout the day.  D/W son, grandson, and Charity fundraiser. SLP will follow for PO readiness vs MBS. SLP Visit Diagnosis: Dysphagia, oropharyngeal phase (R13.12)    Aspiration Risk    unknown   Diet Recommendation   NPO       Other  Recommendations Oral Care Recommendations: Oral care prior to ice chip/H20    Recommendations for follow up therapy are one component of a multi-disciplinary discharge planning process, led by the attending physician.  Recommendations may be updated based on patient status, additional functional criteria and insurance authorization.  Follow up Recommendations Other (comment) (tba)              Frequency and Duration min 2x/week  2 weeks       Prognosis Prognosis for improved oropharyngeal function: Guarded      Swallow Study   General Date of Onset: 05/13/23 HPI: Ruth Roth is a 89 y.o. female who presented to the ED from home on 05/14/23 after being found down at home, awake but verbally unresponsive and with right sided weakness. Dx L CVA. S/P thrombectomy 3/5. Intubated 3/5-3/5. MRI: Many acute Infarcts throughout the left MCA territory.   Associated edema without substantial mass effect. No midline shift.  Remote left PCA territory infarct. PMHx of HFpEF, pacemaker, atrial fibrillation (on Xarelto), Autoimmune hepatitis, Glaucoma, Hypercholesterolemia, Hypertension, Hypothyroidism, Inflammatory arthritis, CAD, Mitral regurgitation, Neuropathy, Osteoarthritis and PUD Type of Study: Bedside Swallow Evaluation Previous Swallow Assessment: no Diet Prior to this Study: NPO Temperature Spikes Noted: No Respiratory Status: Nasal cannula (4L) History of Recent Intubation: Yes Total duration of intubation (days): 1 days Date extubated: 05/14/23 Behavior/Cognition: Alert Oral Cavity Assessment: Other (comment) (pt would not allow assessment) Oral Care Completed by SLP: No Self-Feeding Abilities: Total assist Patient Positioning: Upright in bed Baseline Vocal Quality: Not observed Volitional Cough: Cognitively unable to elicit Volitional Swallow: Unable to elicit    Oral/Motor/Sensory Function Overall Oral Motor/Sensory Function: Mild impairment Facial Symmetry: Abnormal symmetry right (decreased nasolabial fold on right)   Ice Chips Ice chips: Impaired Presentation: Spoon Pharyngeal Phase Impairments: Multiple swallows;Suspected delayed Swallow;Cough - Immediate   Thin Liquid Thin Liquid: Impaired Presentation: Spoon Pharyngeal  Phase Impairments: Multiple swallows;Cough - Immediate    Nectar Thick Nectar Thick Liquid: Not tested   Honey Thick Honey Thick Liquid:  Not tested   Puree Puree: Not tested   Solid     Solid: Not tested      [name] 05/15/2023,10:26 AM  Marchelle Folks L. Samson Frederic, MA CCC/SLP Clinical Specialist - Acute Care SLP Acute Rehabilitation Services Office number [phone]

## 2023-05-15 NOTE — Progress Notes (Signed)
   Patient Name: Ruth Roth Date of Encounter: 05/15/2023 [facility] Cardiologist: Lorine Bears, MD   Interval Summary  .    Extubated.  Alert, but clearly has severe global aphasia.  Did not do well with bedside swallow study.  Plan for feeding tube tomorrow. Ventricular rate control is good on intravenous diltiazem and metoprolol.  Vital Signs .    Vitals:   05/15/23 0700 05/15/23 0800 05/15/23 0900 05/15/23 1000  BP: (!) 134/112 (!) 136/50 (!) 132/48 (!) 144/50  Pulse: 63 64 60 62  Resp: (!) 28 (!) 25 (!) 25 17  Temp:  98 F (36.7 C)    TempSrc:  Axillary    SpO2: 95% 98% 96% 96%  Weight:      Height:        Intake/Output Summary (Last 24 hours) at 05/15/2023 1028 Last data filed at 05/15/2023 0900 Gross per 24 hour  Intake 2327.53 ml  Output 175 ml  Net 2152.53 ml      05/14/2023    1:28 AM 05/14/2023    1:00 AM 05/13/2023    3:22 PM  Last 3 Weights  Weight (lbs) 137 lb 2 oz 133 lb 13.1 oz 140 lb  Weight (kg) 62.2 kg 60.7 kg 63.504 kg      Telemetry/ECG    Atrial flutter, mostly 4: 1 AV block, ventricular rates mostly in the 60s, rare ventricular pacing - Personally Reviewed  Physical Exam .   GEN: No acute distress.   Neck: No JVD Cardiac: RRR, no murmurs, rubs, or gallops.  Healthy pacemaker site Respiratory: Clear to auscultation bilaterally. GI: Soft, nontender, non-distended  MS: No edema  Assessment & Plan .     89 year old with permanent atrial flutter, pacemaker, HFpEF, HTN, HLP, PAD, nonobstructive CAD, found down at home, scattered infarcts in left MCA distribution despite chronic anticoagulant therapy.  She does have a 60% left internal carotid artery stenosis as well.  Echocardiogram pending  Using intravenous medications for rate control until we have enteral access.  We can switch to diltiazem 60 mg 3 times a day and metoprolol tartrate 150 mg twice daily per feeding tube, which is roughly equivalent to what she was previously taking at  home for rate control.  Plan to switch from Xarelto to Eliquis once able to give enteral anticoagulants, since it appears that she may have "failed" Xarelto (she was taking the 15 mg dose based on her age calculated GFR of around 35-40).  Based on her age, renal function and weight she would probably be on Eliquis 5 mg twice daily, although her weight is just slightly above the 60 kg mark.  Despite good previous functional status, she would likely need a lot of PT/OT/speech therapy and is unlikely to return to independent living.  For questions or updates, please contact [facility] Please consult [email or web address] for contact info under        Signed, Thurmon Fair, MD

## 2023-05-15 NOTE — Progress Notes (Signed)
 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  She missed her doctors appointment on 3 4 and a wellness check was performed and they did break down her door to access her home.  She is found lying on the ground with multiple bruises and transferred to the ED as a level 2 trauma.  Upon arrival to the ED she was noted to have rapid atrial fibrillation as well as evidence of UTI with elevation in CK.  Trauma workup was unremarkable and cardiology is consulted for atrial fibrillation flutter.  She started on diltiazem infusion and was placed on IV amiodarone as well as beta-blocker.  She was placed on IV heparin and there is concern for stroke provoking her fall neurology was consulted.  CTA of the head and neck demonstrated occluded versus a severely stenotic distal left M1 MCA with 50 mL of left MCA penumbra.  Options were considered and she ultimately underwent interventional radiology for mechanical thrombectomy and ATICI 3 revascularization result was achieved.  Post procedurally she was transferred to the neuro ICU and PCCM was consulted given that she remained on the ventilator.  She was then subsequently extubated and sedation was weaned and transferred to the [facility] service on 05/15/2023.  She currently continues to have global aphasia and now has a worsening AKI.  Assessment and Plan:  Acute left MCA scattered infarcts which is likely embolic to an A-fib event even on anticoagulation status post revascularization 3 5 -She is  status post IR left M1 occlusion with a tiki 3 and MRI showed multifocal acute infarcts throughout the left MCA territory.  MRI revealed distal M1 MCA stenosis versus occlusion which is now patent Echocardiogram has been done and neurology now recommending aspirin 300 mg daily suppository and Eliquis once p.o. access established.  Plan is for small bore core track in the a.m. further care per neurology and PT OT currently are recommending SNF.  Further stroke team recommendations appreciated and recommending systolic goal blood pressure being 03/31/1936 and maintain neuroprotective measures with goal for euthymia, euglycemia, natremia normoxia and appears CO2 of 35-40  A flutter/atrial fibrillation with RVR/CAD/permanent pacemaker: Currently on IV therapies with IV diltiazem and IV metoprolol but once able to get enteral access cardiology is recommending planning to switch her to diltiazem 60 mg 3 times daily metoprolol to tartrate 150 mg p.o. twice daily and switch from Xarelto to Eliquis when she is able to be given enteral anticoagulant since and that she may have failed Xarelto.  Continue to monitor on telemetry and further care per cardiology  Chronic diastolic heart failure with preserved ejection fraction -BNP is about her baseline -Continue to monitor for signs and symptoms 1 we will give now that we are rehydrating her given her AKI -Strict I's and O's and daily weights and will need to continue monitor for signs of volume overload given that she is getting IV therapies  AKI on CKD Stage 3b Metabolic acidosis -BUN/Cr Trend: Recent Labs  Lab 05/13/23 1518 05/13/23 1526 05/14/23 0455 05/15/23 0538  BUN 29* 31* 31* 35*  CREATININE 1.24*  1.10* 1.19* 2.34*  -Check urinalysis, urine sodium, urine creatinine, urine osmolality and renal ultrasound as well as bladder scans and start IV fluid hydration with normal saline 75 mL/h -Patient also has a metabolic acidosis with a CO2 of 15, anion gap  of 13, chloride of 112 -Avoid Nephrotoxic Medications, Contrast Dyes, Hypotension and Dehydration to Ensure Adequate Renal Perfusion and will need to Renally Adjust Meds -Continue to Monitor and Trend Renal Function carefully and repeat CMP in the AM   Normocytic Anemia: Hemoglobin/hematocrit is now 10.7/34.2.  Check anemia panel in AM.  Continue to monitor for signs of bleeding; no overt bleeding noted.  Repeat CBC in a.m.  Sepsis secondary to gram-negative UTI -Continue with IV ceftriaxone given that she is nitrate positive.  Stop date is in place.  Leukocytosis has resolved and urinalysis is improving  Respiratory failure with hypoxia -Was vented postoperatively and was weaned and now started on Xopenex and Atrovent  Hypothyroidism -TSH is elevated but free T4 is within normal limits will resume home Synthroid once enteral access has been initiated   DVT prophylaxis: heparin injection 5,000 Units Start: 05/16/23 0600 Place and maintain sequential compression device Start: 05/14/23 0151 SCDs Start: 05/13/23 2145 Place TED hose Start: 05/13/23 2145    Code Status: Full Code Family Communication: Discussed with family at bedside  Disposition Plan:  Level of care: ICU Status is: Inpatient Remains inpatient appropriate because: If further clinical improvement and clearance by the specialist   Consultants:  PCCM transfer Interventional radiology Neurology and stroke team Cardiology  Procedures:  As delineated above  Antimicrobials:  Anti-infectives (From admission, onward)    Start     Dose/Rate Route Frequency Ordered Stop   05/14/23 0800  cefTRIAXone (ROCEPHIN) 1 g in sodium chloride 0.9 % 100 mL IVPB        1 g 200 mL/hr over 30 Minutes Intravenous Every 24 hours 05/13/23 2138 05/15/23 0826   05/13/23 2000  cefTRIAXone (ROCEPHIN) 1 g in sodium chloride 0.9 % 100 mL IVPB        1 g 200 mL/hr over 30 Minutes Intravenous  Once 05/13/23 1950 05/13/23 2055        Subjective: Seen and examined at bedside she is sitting in a chair and will continue with her aspirin has been global aphasia and is not able to speak or communicate.  Family at bedside thinks she is more awake and alert today compared to yesterday.  No other concerns or complaints at this time.  Objective: Vitals:   05/15/23 1600 05/15/23 1700 05/15/23 1800 05/15/23 1900  BP: (!) 125/47 134/63 137/61 (!) 126/50  Pulse: 60 61 60 60  Resp: (!) 22 (!) 26 (!) 25 (!) 26  Temp: 98.1 F (36.7 C)     TempSrc: Oral     SpO2: 96% 92% 96% 92%  Weight:      Height:        Intake/Output Summary (Last 24 hours) at 05/15/2023 2013 Last data filed at 05/15/2023 1900 Gross per 24 hour  Intake 1238.59 ml  Output 190 ml  Net 1048.59 ml   Filed Weights   05/13/23 1522 05/14/23 0100 05/14/23 0128  Weight: 63.5 kg 60.7 kg 62.2 kg   Examination: Physical Exam:  Constitutional: Thin elderly Caucasian female sitting in the chair at bedside who remains aphasic Respiratory: Diminished to auscultation bilaterally with some coarse breath sounds with some very faint rhonchi in and some slight wheezing but no appreciable rales or crackles.. Normal respiratory  effort and patient is not tachypenic. No accessory muscle use.  Unlabored breathing but was wheezing a little earlier Cardiovascular: RRR, no murmurs / rubs / gallops. S1 and S2 auscultated. No extremity edema. Abdomen: Soft, non-tender, non-distended. Bowel sounds positive.  GU: Deferred. Musculoskeletal: No clubbing / cyanosis of digits/nails. No joint deformity upper and lower extremities.  Skin: No rashes, lesions, ulcers on limited skin evaluation. No induration; Warm and dry.  Neurologic: Remains globally aphasic and has a right facial droop with diminished strength Psychiatric: Appears calm but unable to fully assess given her current condition.   Data Reviewed: I have personally reviewed following labs and imaging studies  CBC: Recent Labs   Lab 05/13/23 1518 05/13/23 1526 05/13/23 1857 05/14/23 0155 05/14/23 0455 05/15/23 0538  WBC 14.0*  --   --   --  10.6* 8.9  NEUTROABS  --   --   --   --  8.9*  --   HGB 15.6* 16.7* 15.3* 13.6 13.1 10.7*  HCT 49.2* 49.0* 45.0 40.0 41.4 34.2*  MCV 98.0  --   --   --  97.6 99.7  PLT 206  --   --   --  170 155   Basic Metabolic Panel: Recent Labs  Lab 05/13/23 1518 05/13/23 1526 05/13/23 1857 05/14/23 0155 05/14/23 0455 05/15/23 0538  NA 141 143 144 140 142 140  K 4.1 4.1 3.8 3.3* 3.7 4.1  CL 109 113*  --   --  108 112*  CO2 17*  --   --   --  19* 15*  GLUCOSE 138* 135*  --   --  145* 91  BUN 29* 31*  --   --  31* 35*  CREATININE 1.24* 1.10*  --   --  1.19* 2.34*  CALCIUM 9.5  --   --   --  9.0 8.6*  MG 2.0  --   --   --   --   --    GFR: Estimated Creatinine Clearance: 12.7 mL/min (A) (by C-G formula based on SCr of 2.34 mg/dL (H)). Liver Function Tests: Recent Labs  Lab 05/13/23 1518 05/14/23 0455 05/15/23 0538  AST 74* 43* 33  ALT 50* 41 37  ALKPHOS 98 79 70  BILITOT 1.3* 0.7 0.6  PROT 6.8 5.8* 5.7*  ALBUMIN 3.8 3.2* 3.5   No results for input(s): "LIPASE", "AMYLASE" in the last 168 hours. Recent Labs  Lab 05/13/23 1835  AMMONIA 17   Coagulation Profile: Recent Labs  Lab 05/13/23 1518 05/14/23 0455  INR 1.2 1.2   Cardiac Enzymes: Recent Labs  Lab 05/13/23 1518 05/14/23 0455 05/15/23 1244  CKTOTAL 1,135* 472* 311*   BNP (last 3 results) No results for input(s): "PROBNP" in the last 8760 hours. HbA1C: Recent Labs    05/14/23 0455  HGBA1C 5.4   CBG: Recent Labs  Lab 05/15/23 0350 05/15/23 0731 05/15/23 1114 05/15/23 1517 05/15/23 2010  GLUCAP 101* 99 97 98 96   Lipid Profile: Recent Labs    05/14/23 0455  CHOL 172  HDL 67  LDLCALC 67  TRIG 189*  CHOLHDL 2.6   Thyroid Function Tests: Recent Labs    05/13/23 2022  TSH 8.301*  FREET4 0.65   Anemia Panel: No results for input(s): "VITAMINB12", "FOLATE", "FERRITIN",  "TIBC", "IRON", "RETICCTPCT" in the last 72 hours. Sepsis Labs: Recent Labs  Lab 05/13/23 1526 05/14/23 0455 05/15/23 1015 05/15/23 1244  LATICACIDVEN 3.2* 2.2* 1.4 1.6   Recent Results (from the past 240  hours)  MRSA Next Gen by PCR, Nasal     Status: Abnormal   Collection Time: 05/14/23 12:59 AM   Specimen: Nasal Mucosa; Nasal Swab  Result Value Ref Range Status   MRSA by PCR Next Gen DETECTED (A) NOT DETECTED Final    Comment: RESULT CALLED TO, READ BACK BY AND VERIFIED WITH: K JONES,RN@[id number]  05/14/23 MK (NOTE) The GeneXpert MRSA Assay (FDA approved for NASAL specimens only), is one component of a comprehensive MRSA colonization surveillance program. It is not intended to diagnose MRSA infection nor to guide or monitor treatment for MRSA infections. Test performance is not FDA approved in patients less than 43 years old. Performed at [facility], [address], [place], [place] [address]   Culture, blood (Routine X 2) w Reflex to ID Panel     Status: None (Preliminary result)   Collection Time: 05/14/23  4:42 AM   Specimen: BLOOD  Result Value Ref Range Status   Specimen Description BLOOD SITE NOT SPECIFIED  Final   Special Requests   Final    AEROBIC BOTTLE ONLY Blood Culture results may not be optimal due to an inadequate volume of blood received in culture bottles   Culture   Final    NO GROWTH 1 DAY Performed at [facility], [address] [place], [place] [address]    Report Status PENDING  Incomplete  Culture, blood (Routine X 2) w Reflex to ID Panel     Status: None (Preliminary result)   Collection Time: 05/14/23  4:55 AM   Specimen: BLOOD RIGHT HAND  Result Value Ref Range Status   Specimen Description BLOOD RIGHT HAND  Final   Special Requests   Final    BOTTLES DRAWN AEROBIC AND ANAEROBIC Blood Culture adequate volume   Culture   Final    NO GROWTH 1 DAY Performed at [facility], [address] [place], [place] [address]    Report  Status PENDING  Incomplete  Urine Culture (for pregnant, neutropenic or urologic patients or patients with an indwelling urinary catheter)     Status: Abnormal (Preliminary result)   Collection Time: 05/14/23  6:22 AM   Specimen: Urine, Clean Catch  Result Value Ref Range Status   Specimen Description URINE, CLEAN CATCH  Final   Special Requests NONE  Final   Culture (A)  Final    10,000 COLONIES/mL GRAM NEGATIVE RODS SUSCEPTIBILITIES TO FOLLOW Performed at [facility], [address], [place], [place] [address]    Report Status PENDING  Incomplete  Resp panel by RT-PCR (RSV, Flu A&B, Covid) Anterior Nasal Swab     Status: None   Collection Time: 05/14/23  6:22 AM   Specimen: Anterior Nasal Swab  Result Value Ref Range Status   SARS Coronavirus 2 by RT PCR NEGATIVE NEGATIVE Final   Influenza A by PCR NEGATIVE NEGATIVE Final   Influenza B by PCR NEGATIVE NEGATIVE Final    Comment: (NOTE) The Xpert Xpress SARS-CoV-2/FLU/RSV plus assay is intended as an aid in the diagnosis of influenza from Nasopharyngeal swab specimens and should not be used as a sole basis for treatment. Nasal washings and aspirates are unacceptable for Xpert Xpress SARS-CoV-2/FLU/RSV testing.  Fact Sheet for Patients: BloggerCourse.com  Fact Sheet for Healthcare Providers: SeriousBroker.it  This test is not yet approved or cleared by the [place] FDA and has been authorized for detection and/or diagnosis of SARS-CoV-2 by FDA under an Emergency Use Authorization (EUA). This  EUA will remain in effect (meaning this test can be used) for the duration of the COVID-19 declaration under Section 564(b)(1) of the Act, 21 U.S.C. section 360bbb-3(b)(1), unless the authorization is terminated or revoked.     Resp Syncytial Virus by PCR NEGATIVE NEGATIVE Final    Comment: (NOTE) Fact Sheet for Patients: BloggerCourse.com  Fact  Sheet for Healthcare Providers: SeriousBroker.it  This test is not yet approved or cleared by the [place] FDA and has been authorized for detection and/or diagnosis of SARS-CoV-2 by FDA under an Emergency Use Authorization (EUA). This EUA will remain in effect (meaning this test can be used) for the duration of the COVID-19 declaration under Section 564(b)(1) of the Act, 21 U.S.C. section 360bbb-3(b)(1), unless the authorization is terminated or revoked.  Performed at [facility], [address], [place], [place] [address]     Radiology Studies: DG CHEST PORT 1 VIEW Result Date: 05/15/2023 CLINICAL DATA:  Wheezing. EXAM: PORTABLE CHEST 1 VIEW COMPARISON:  Radiographs 05/14/2023 and 05/13/2023.  CT 05/13/2023. FINDINGS: 1044 hours. Interval extubation and removal of the enteric tube. Left subclavian pacemaker leads appear unchanged, projecting over the right atrium and right ventricle. The heart size and mediastinal contours are stable. However, there are new diffuse ground-glass opacities bilaterally with septal thickening and small left greater than right pleural effusions. No evidence of pneumothorax. The bones appear unchanged. Asymmetric glenohumeral degenerative changes on the right. IMPRESSION: New diffuse ground-glass opacities bilaterally with septal thickening and small left greater than right pleural effusions, most consistent with pulmonary edema/congestive heart failure. Electronically Signed   By: Carey Bullocks M.D.   On: 05/15/2023 14:44   IR CT Head Ltd Result Date: 05/15/2023 INDICATION: New onset right-sided hemiplegia and aphasia. 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 EXAM: 1. EMERGENT LARGE VESSEL OCCLUSION THROMBOLYSIS (anterior CIRCULATION) COMPARISON:  CT angiogram of the head and neck of May 13, 2023. MEDICATIONS: No antibiotic was administered within 1 hour of the procedure. ANESTHESIA/SEDATION:  General anesthesia. CONTRAST:  Omnipaque 300 approximately 120 mL. FLUOROSCOPY TIME:  Fluoroscopy Time: 30 minutes 0 seconds (7419 mGy). COMPLICATIONS: None immediate. TECHNIQUE: Following a full explanation of the procedure along with the potential associated complications, an informed witnessed consent was obtained. The risks of intracranial hemorrhage of 10%, worsening neurological deficit, ventilator dependency, death and inability to revascularize were all reviewed in detail with the patient's son. The patient was then put under general anesthesia by the Department of Anesthesiology at [facility]. The right groin was prepped and draped in the usual sterile fashion. Thereafter using modified Seldinger technique, transfemoral access into the right common femoral artery was obtained without difficulty. Over an 0.035 inch guidewire an 8 French 25 cm Pinnacle sheath was inserted. Through this, and also over an 0.035 inch guidewire a combination of a support 088 100 cm Zoom catheter and a 125 cm Simmons 2 support catheter was advanced to the aortic arch region and selectively positioned in the left common carotid artery. Arteriograms were then performed centered extra cranially and intracranially. FINDINGS: Left common carotid arteriogram demonstrates the left external carotid artery and its major branches to be widely patent. The left internal carotid artery at the bulb to the cranial skull base is widely patent. The petrous, the cavernous and the supraclinoid segments demonstrate wide patency. The left anterior cerebral artery opacifies into the capillary and venous phases. The left middle cerebral artery demonstrates patency of the proximal 2/3 of the  M1 segment. The distal 1/3 demonstrates occlusion. PROCEDURE: Through the Zoom 088 100 cm support catheter in the cervical petrous junction, a combination of an 071 132 cm Zoom with an 021 150 cm Phenom microcatheter was advanced over an 018 inch standard  micro guidewire with a moderate J configuration to the supraclinoid left ICA. The micro guidewire was then advanced to the distal left M1 segment followed by the microcatheter followed by the 071 Zoom aspiration catheter. The Zoom aspiration catheter was then engaged into the occluded distal M1 segment. Advancement of the microcatheter through the occluded distal M1 segment into the inferior over the superior divisions was met with significant resistance. The microcatheter and micro guidewire were removed. Constant aspiration was then applied at the hub of the Zoom aspiration catheter for approximately 2 minutes. Thereafter, with proximal aspiration, the combination of the Zoom aspiration catheter was retrieved and removed. A control arteriogram performed through the 088 Zoom support catheter demonstrated improved recanalization of the distal M1 with a few minor opacifying distal to this. A second pass was then made using an 062 134 cm Penumbra aspiration catheter with an 043 distal aspiration over an 018 inch micro guidewire. The microcatheter was again advanced to the mildly recanalized distal M1 segment followed by the microcatheter and the 062 Zoom aspiration catheter which was advanced into the occluded left M1 segment. Advancement of the micro guidewire through the firm clot was met with resistance. The microcatheter and micro guidewire were removed. Constant aspiration was then applied at the hub of the 062 Penumbra aspiration catheter for 2 minutes. A control arteriogram performed through the Zoom support catheter guidewire now demonstrated partial revascularization of the inferior division. A third pass was then made using the 062 Penumbra aspiration catheter with an 021 150 cm Phenom microcatheter which was advanced over an 018 inch micro guidewire with a moderate J configuration to the distal M1 segment. The micro guidewire was then gently advanced without difficulty through the inferior division of the  M2 M3 region followed by the microcatheter. The micro guidewire was removed. Good aspiration obtained from the hub of the microcatheter. A gentle control arteriogram performed through this demonstrates safe positioning of the tip of the microcatheter. A 4 mm x 40 mm Solitaire X retrieval device was then advanced to the distal end of the microcatheter and deployed in the usual manner such that the proximal portion covered the distal M1 segment. The 062 aspiration catheter was now engaged into the origin of the inferior division. A constant aspiration was applied at the hub of the 062 aspiration catheter for approximately 2 minutes. Thereafter the combination of the retrieval device and the aspiration catheter was retrieved and removed. A few more chunks of clot were noted in the aspirate. A control arteriogram performed through the support catheter in the left internal carotid artery now demonstrated revascularization of the left MCA branches achieving a TICI 3 revascularization. Also noted at this time was a small filling defect just distal to the origin of the middle branch of the MCA bifurcation. This also demonstrated moderate spasm at its origin which responded to 4 aliquots of 25 mcg of nitroglycerin intra-arterially. The small filling defect in the proximal aspect of the superior division was then cleared with another pass with the 062 Penumbra aspiration catheter advanced in combination with a 150 cm Phenom micro catheter over an 018 inch micro guidewire. Aspiration was applied right at the face of the filling defect for 30 seconds. A control arteriogram performed  following removal of the aspiration catheters demonstrated clearance of the filling defect with mild residual vasospasm which again responded to 25 mcg of nitroglycerin intra-arterially. A TICI 3 revascularization was achieved. Patency was maintained of the left anterior cerebral artery distribution. A final control arteriogram performed through the  Zoom support catheter in the distal left common carotid artery demonstrated patency of the left internal carotid artery extra cranially and intracranially. The intracranial vasculature remained widely patent without any filling defects. An 8 French Angio-Seal closure device was deployed for hemostasis at the at the right groin puncture site. Distal pulses remained present in both feet unchanged compared to prior to the procedure. A flat panel CT of the brain demonstrated no evidence of intracranial hemorrhage. The patient was left intubated due to patient's initial neurologic condition. She was then transferred to the neuro ICU for post revascularization care. IMPRESSION: Status post endovascular complete revascularization of occluded distal left middle cerebral artery M1 segment, and the proximal portions of the superior and the inferior divisions with 3 passes of contact aspiration and 1 pass with a 4 mm x 40 mm Solitaire X retrieval device and contact aspiration achieving a TICI 3 revascularization. PLAN: Follow-up as per referring MD. Electronically Signed   By: Julieanne Cotton M.D.   On: 05/15/2023 08:27   IR PERCUTANEOUS ART THROMBECTOMY/INFUSION INTRACRANIAL INC DIAG ANGIO Result Date: 05/15/2023 INDICATION: New onset right-sided hemiplegia and aphasia. 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 EXAM: 1. EMERGENT LARGE VESSEL OCCLUSION THROMBOLYSIS (anterior CIRCULATION) COMPARISON:  CT angiogram of the head and neck of May 13, 2023. MEDICATIONS: No antibiotic was administered within 1 hour of the procedure. ANESTHESIA/SEDATION: General anesthesia. CONTRAST:  Omnipaque 300 approximately 120 mL. FLUOROSCOPY TIME:  Fluoroscopy Time: 30 minutes 0 seconds (7419 mGy). COMPLICATIONS: None immediate. TECHNIQUE: Following a full explanation of the procedure along with the potential associated complications, an informed witnessed consent was obtained. The risks of intracranial  hemorrhage of 10%, worsening neurological deficit, ventilator dependency, death and inability to revascularize were all reviewed in detail with the patient's son. The patient was then put under general anesthesia by the Department of Anesthesiology at [facility]. The right groin was prepped and draped in the usual sterile fashion. Thereafter using modified Seldinger technique, transfemoral access into the right common femoral artery was obtained without difficulty. Over an 0.035 inch guidewire an 8 French 25 cm Pinnacle sheath was inserted. Through this, and also over an 0.035 inch guidewire a combination of a support 088 100 cm Zoom catheter and a 125 cm Simmons 2 support catheter was advanced to the aortic arch region and selectively positioned in the left common carotid artery. Arteriograms were then performed centered extra cranially and intracranially. FINDINGS: Left common carotid arteriogram demonstrates the left external carotid artery and its major branches to be widely patent. The left internal carotid artery at the bulb to the cranial skull base is widely patent. The petrous, the cavernous and the supraclinoid segments demonstrate wide patency. The left anterior cerebral artery opacifies into the capillary and venous phases. The left middle cerebral artery demonstrates patency of the proximal 2/3 of the M1 segment. The distal 1/3 demonstrates occlusion. PROCEDURE: Through the Zoom 088 100 cm support catheter in the cervical petrous junction, a combination of an 071 132 cm Zoom with an 021 150 cm Phenom microcatheter was advanced over an 018 inch standard micro guidewire with a moderate J configuration to the supraclinoid left ICA. The micro  guidewire was then advanced to the distal left M1 segment followed by the microcatheter followed by the 071 Zoom aspiration catheter. The Zoom aspiration catheter was then engaged into the occluded distal M1 segment. Advancement of the microcatheter through  the occluded distal M1 segment into the inferior over the superior divisions was met with significant resistance. The microcatheter and micro guidewire were removed. Constant aspiration was then applied at the hub of the Zoom aspiration catheter for approximately 2 minutes. Thereafter, with proximal aspiration, the combination of the Zoom aspiration catheter was retrieved and removed. A control arteriogram performed through the 088 Zoom support catheter demonstrated improved recanalization of the distal M1 with a few minor opacifying distal to this. A second pass was then made using an 062 134 cm Penumbra aspiration catheter with an 043 distal aspiration over an 018 inch micro guidewire. The microcatheter was again advanced to the mildly recanalized distal M1 segment followed by the microcatheter and the 062 Zoom aspiration catheter which was advanced into the occluded left M1 segment. Advancement of the micro guidewire through the firm clot was met with resistance. The microcatheter and micro guidewire were removed. Constant aspiration was then applied at the hub of the 062 Penumbra aspiration catheter for 2 minutes. A control arteriogram performed through the Zoom support catheter guidewire now demonstrated partial revascularization of the inferior division. A third pass was then made using the 062 Penumbra aspiration catheter with an 021 150 cm Phenom microcatheter which was advanced over an 018 inch micro guidewire with a moderate J configuration to the distal M1 segment. The micro guidewire was then gently advanced without difficulty through the inferior division of the M2 M3 region followed by the microcatheter. The micro guidewire was removed. Good aspiration obtained from the hub of the microcatheter. A gentle control arteriogram performed through this demonstrates safe positioning of the tip of the microcatheter. A 4 mm x 40 mm Solitaire X retrieval device was then advanced to the distal end of the  microcatheter and deployed in the usual manner such that the proximal portion covered the distal M1 segment. The 062 aspiration catheter was now engaged into the origin of the inferior division. A constant aspiration was applied at the hub of the 062 aspiration catheter for approximately 2 minutes. Thereafter the combination of the retrieval device and the aspiration catheter was retrieved and removed. A few more chunks of clot were noted in the aspirate. A control arteriogram performed through the support catheter in the left internal carotid artery now demonstrated revascularization of the left MCA branches achieving a TICI 3 revascularization. Also noted at this time was a small filling defect just distal to the origin of the middle branch of the MCA bifurcation. This also demonstrated moderate spasm at its origin which responded to 4 aliquots of 25 mcg of nitroglycerin intra-arterially. The small filling defect in the proximal aspect of the superior division was then cleared with another pass with the 062 Penumbra aspiration catheter advanced in combination with a 150 cm Phenom micro catheter over an 018 inch micro guidewire. Aspiration was applied right at the face of the filling defect for 30 seconds. A control arteriogram performed following removal of the aspiration catheters demonstrated clearance of the filling defect with mild residual vasospasm which again responded to 25 mcg of nitroglycerin intra-arterially. A TICI 3 revascularization was achieved. Patency was maintained of the left anterior cerebral artery distribution. A final control arteriogram performed through the Zoom support catheter in the distal left common carotid  artery demonstrated patency of the left internal carotid artery extra cranially and intracranially. The intracranial vasculature remained widely patent without any filling defects. An 8 French Angio-Seal closure device was deployed for hemostasis at the at the right groin puncture  site. Distal pulses remained present in both feet unchanged compared to prior to the procedure. A flat panel CT of the brain demonstrated no evidence of intracranial hemorrhage. The patient was left intubated due to patient's initial neurologic condition. She was then transferred to the neuro ICU for post revascularization care. IMPRESSION: Status post endovascular complete revascularization of occluded distal left middle cerebral artery M1 segment, and the proximal portions of the superior and the inferior divisions with 3 passes of contact aspiration and 1 pass with a 4 mm x 40 mm Solitaire X retrieval device and contact aspiration achieving a TICI 3 revascularization. PLAN: Follow-up as per referring MD. Electronically Signed   By: Julieanne Cotton M.D.   On: 05/15/2023 08:27   MR BRAIN WO CONTRAST Result Date: 05/14/2023 CLINICAL DATA:  Stroke, follow up EXAM: MRI HEAD WITHOUT CONTRAST MRA HEAD WITHOUT CONTRAST TECHNIQUE: Multiplanar, multi-echo pulse sequences of the brain and surrounding structures were acquired without intravenous contrast. Angiographic images of the Circle of Willis were acquired using MRA technique without intravenous contrast. COMPARISON:  CTA head/neck May 13, 2023. FINDINGS: MRI HEAD FINDINGS Brain: Many acute Infarcts throughout the left MCA territory. Associated edema without substantial mass effect. No midline shift. Remote left PCA territory infarct. Patchy T2/FLAIR hyperintensities the white matter, compatible with chronic microvascular ischemic disease. Remote left cerebellar infarct. No mass lesion. Cerebral atrophy. Vascular: See below. Skull and upper cervical spine: Normal marrow signal. Sinuses/Orbits: Mild paranasal sinus mucosal thickening. No acute orbital findings. Other: No mastoid effusions. MRA HEAD FINDINGS Anterior circulation: Bilateral intracranial ICAs, MCAs, and ACAs are patent without proximal hemodynamically significant stenosis. Previously seen distal left  M1 MCA stenosis versus occlusion now appears patent. Posterior circulation: Bilateral intradural vertebral arteries, basilar artery and bilateral posterior cerebral arteries are patent without proximal hemodynamically significant stenosis. IMPRESSION: 1. Multiple acute infarcts throughout the left MCA territory. 2. Previously seen distal left M1 MCA stenosis versus occlusion now appears patent. No large vessel occlusion or proximal hemodynamically significant stenosis. Electronically Signed   By: Feliberto Harts M.D.   On: 05/14/2023 19:31   MR ANGIO HEAD WO CONTRAST Result Date: 05/14/2023 CLINICAL DATA:  Stroke, follow up EXAM: MRI HEAD WITHOUT CONTRAST MRA HEAD WITHOUT CONTRAST TECHNIQUE: Multiplanar, multi-echo pulse sequences of the brain and surrounding structures were acquired without intravenous contrast. Angiographic images of the Circle of Willis were acquired using MRA technique without intravenous contrast. COMPARISON:  CTA head/neck May 13, 2023. FINDINGS: MRI HEAD FINDINGS Brain: Many acute Infarcts throughout the left MCA territory. Associated edema without substantial mass effect. No midline shift. Remote left PCA territory infarct. Patchy T2/FLAIR hyperintensities the white matter, compatible with chronic microvascular ischemic disease. Remote left cerebellar infarct. No mass lesion. Cerebral atrophy. Vascular: See below. Skull and upper cervical spine: Normal marrow signal. Sinuses/Orbits: Mild paranasal sinus mucosal thickening. No acute orbital findings. Other: No mastoid effusions. MRA HEAD FINDINGS Anterior circulation: Bilateral intracranial ICAs, MCAs, and ACAs are patent without proximal hemodynamically significant stenosis. Previously seen distal left M1 MCA stenosis versus occlusion now appears patent. Posterior circulation: Bilateral intradural vertebral arteries, basilar artery and bilateral posterior cerebral arteries are patent without proximal hemodynamically significant  stenosis. IMPRESSION: 1. Multiple acute infarcts throughout the left MCA territory. 2. Previously seen distal left  M1 MCA stenosis versus occlusion now appears patent. No large vessel occlusion or proximal hemodynamically significant stenosis. Electronically Signed   By: Feliberto Harts M.D.   On: 05/14/2023 19:31   DG Abd Portable 1V Result Date: 05/14/2023 CLINICAL DATA:  Check gastric catheter placement EXAM: PORTABLE ABDOMEN - 1 VIEW COMPARISON:  None Available. FINDINGS: Gastric catheter is noted within the stomach. Contrast is noted within the kidneys bilaterally. No free air is seen. IMPRESSION: Gastric catheter in the stomach. Electronically Signed   By: Alcide Clever M.D.   On: 05/14/2023 02:54   Portable Chest x-ray Result Date: 05/14/2023 CLINICAL DATA:  Status post intubation EXAM: PORTABLE CHEST 1 VIEW COMPARISON:  Film from the previous day. FINDINGS: Cardiac shadow is within normal limits. Pacing device is again seen. Endotracheal tube and gastric catheter are noted in satisfactory position. Lungs are free of acute infiltrate or effusion. IMPRESSION: No active disease. Electronically Signed   By: Alcide Clever M.D.   On: 05/14/2023 02:53   CT ANGIO HEAD NECK W WO CM W PERF Result Date: 05/13/2023 CLINICAL DATA:  Neuro deficit, acute, stroke suspected EXAM: CT ANGIOGRAPHY HEAD AND NECK CT PERFUSION BRAIN TECHNIQUE: Multidetector CT imaging of the head and neck was performed using the standard protocol during bolus administration of intravenous contrast. Multiplanar CT image reconstructions and MIPs were obtained to evaluate the vascular anatomy. Carotid stenosis measurements (when applicable) are obtained utilizing NASCET criteria, using the distal internal carotid diameter as the denominator. Multiphase CT imaging of the brain was performed following IV bolus contrast injection. Subsequent parametric perfusion maps were calculated using RAPID software. RADIATION DOSE REDUCTION: This exam was  performed according to the departmental dose-optimization program which includes automated exposure control, adjustment of the mA and/or kV according to patient size and/or use of iterative reconstruction technique. CONTRAST:  OMNIPAQUE IOHEXOL 350 MG/ML SOLN COMPARISON:  Same day CT head. FINDINGS: CTA NECK FINDINGS Aortic arch: Atherosclerosis.  Great vessel origins are patent. Right carotid system: Atherosclerosis at the carotid bifurcation without greater than 50% stenosis. Also, atherosclerosis at the skull base with approximately 60% stenosis. Left carotid system: Atherosclerosis without greater than 50% stenosis. Vertebral arteries: Left dominant. No significant (greater than 50%) stenosis. Skeleton: No acute abnormality on limited assessment. Other neck: No acute abnormality on limited assessment. Upper chest: Visualized lung apices are clear. Review of the MIP images confirms the above findings CTA HEAD FINDINGS Anterior circulation: Bilateral intracranial ICAs are patent without significant stenosis. Right MCA and bilateral ACAs are patent without proximal hemodynamically significant stenosis. Distal left M1 MCA occlusion versus severe stenosis. Asymmetric opacification of the left MCA vessels more distally. Posterior circulation: Bilateral intradural vertebral arteries, basilar artery and bilateral posterior cerebral arteries are patent without proximal hemodynamically significant stenosis. Venous sinuses: As permitted by contrast timing, patent. Review of the MIP images confirms the above findings CT Brain Perfusion Findings: CBF (<30%) Volume: 0mL Perfusion (Tmax>6.0s) volume: 50mL Mismatch Volume: 50mL Infarction Location:None identified. IMPRESSION: 1. Occluded versus severely stenotic distal left M1 MCA with asymmetrically diminished distal left MCA opacification. Recommend code stroke activation and emergent neurology consultation. 2. Approximately 50 mL of left MCA penumbra without evidence  of core infarct by CT perfusion. 3. Approximately 60% stenosis of the right ICA at the skull base. Findings and recommendations discussed with Dr. Wilkie Aye via telephone at Dr. Wilkie Aye. Electronically Signed   By: Feliberto Harts M.D.   On: 05/13/2023 21:40   Scheduled Meds:  arformoterol  15 mcg Nebulization BID  aspirin  300 mg Rectal Daily   budesonide (PULMICORT) nebulizer solution  0.25 mg Nebulization BID   Chlorhexidine Gluconate Cloth  6 each Topical Daily   docusate  100 mg Per Tube BID   Gerhardt's butt cream   Topical TID   [START ON 05/16/2023] heparin injection (subcutaneous)  5,000 Units Subcutaneous Q8H   ipratropium  0.5 mg Nebulization Q6H   levalbuterol  0.63 mg Nebulization Q6H   metoprolol tartrate  2.5 mg Intravenous Q6H   mupirocin ointment  1 Application Nasal BID   polyethylene glycol  17 g Per Tube Daily   Continuous Infusions:  sodium chloride 75 mL/hr at 05/15/23 1900   diltiazem (CARDIZEM) infusion 15 mg/hr (05/15/23 1900)    LOS: 2 days   Marguerita Merles, DO [facility] Available via Epic secure chat 7am-7pm After these hours, please refer to coverage provider listed on [email or web address] 05/15/2023, 8:13 PM

## 2023-05-15 NOTE — Evaluation (Addendum)
 Physical Therapy Evaluation Patient Details Name: Ruth Roth MRN: [id number] DOB: [birth year] Today's Date: 05/15/2023  History of Present Illness  89 year old female admitted 05/14/23 after being found down at home with R sided weakness, incontinent. CTA head and neck showed occluded versus severe stenosis left M1 MCA with a symmetrical diminished distal left MCA opacification. X rays negative for acute fx. Code stroke activated in the ED with increasing dysarthria. S/p IR revascularization. Dx with UTI and sepsis. Chest x-ray evidence of pneumonia. PMH includes HFpEF, pacemaker (July 2023), atrial fibrillation (on Xarelto), Autoimmune hepatitis, Fibrocystic breast disease, Glaucoma, Hypercholesterolemia, Hypertension, Hypothyroidism, Inflammatory arthritis, CAD, Mitral regurgitation, Neuropathy, Osteoarthritis and PUD.  Clinical Impression  Patient presents with decreased mobility due to decreased balance, decreased strength with R side hemiparesis, decreased safety/deficit awareness, and decreased activity tolerance.  Patient previously living alone and independent.  She needed +2 A for bed mobility, sit to stand and stepping to recliner.  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  Feel she will benefit from skilled PT in the acute setting and from post-acute inpatient rehab (<3 hours/day) at d/c.         If plan is discharge home, recommend the following: Two people to help with walking and/or transfers;Two people to help with bathing/dressing/bathroom;Help with stairs or ramp for entrance;Assist for transportation;Assistance with cooking/housework   Can travel by private vehicle   No    Equipment Recommendations Other (comment) (TBA)  Recommendations for Other Services       Functional Status Assessment Patient has had a recent decline in their functional status and demonstrates the ability to make significant improvements in function in a reasonable and predictable amount of time.      Precautions / Restrictions Precautions Precautions: Fall Precaution/Restrictions Comments: global aphasia      Mobility  Bed Mobility Overal bed mobility: Needs Assistance Bed Mobility: Supine to Sit     Supine to sit: Max assist, +2 for physical assistance, +2 for safety/equipment, HOB elevated     General bed mobility comments: no initiation, use of bed pad to facilitate, assit for trunk elevation and BLE off the bed    Transfers Overall transfer level: Needs assistance Equipment used: 2 person hand held assist Transfers: Bed to chair/wheelchair/BSC     Step pivot transfers: Max assist, +2 physical assistance, +2 safety/equipment       General transfer comment: initially Pt in very flexed position encouraged by grandson to stand upright, then with max support from 2 therapist take short pivotal steps to recliner    Ambulation/Gait                  Stairs            Wheelchair Mobility     Tilt Bed    Modified Rankin (Stroke Patients Only) Modified Rankin (Stroke Patients Only) Pre-Morbid Rankin Score: No symptoms Modified Rankin: Severe disability     Balance Overall balance assessment: Needs assistance Sitting-balance support: Bilateral upper extremity supported, Feet supported Sitting balance-Leahy Scale: Poor Sitting balance - Comments: min A to GCA     Standing balance-Leahy Scale: Zero Standing balance comment: dependent on external assist with +2                             Pertinent Vitals/Pain Pain Assessment Pain Assessment: Faces Faces Pain Scale: No hurt    Home Living Family/patient expects to be discharged to:: Private residence  Living Arrangements: Alone Available Help at Discharge: Family;Available PRN/intermittently Type of Home: House Home Access: Level entry       Home Layout: One level Home Equipment: Shower seat      Prior Function Prior Level of Function : Independent/Modified  Independent;Driving                     Extremity/Trunk Assessment   Upper Extremity Assessment Upper Extremity Assessment: Defer to OT evaluation RUE Deficits / Details: edema present, overall seems flaccid - will let it drop out of the air, no direction following, once did very brief potentially recoil of biceps RUE Coordination: decreased fine motor;decreased gross motor    Lower Extremity Assessment Lower Extremity Assessment: Generalized weakness;RLE deficits/detail RLE Deficits / Details: moving both legs, though R weaker with sit to stand and stepping remains flexed    Cervical / Trunk Assessment Cervical / Trunk Assessment: Kyphotic  Communication   Communication Communication: Impaired Factors Affecting Communication: Difficulty expressing self    Cognition Arousal: Alert Behavior During Therapy: Flat affect   PT - Cognitive impairments: Difficult to assess Difficult to assess due to: Impaired communication                       Following commands: Impaired       Cueing Cueing Techniques: Verbal cues, Gestural cues, Tactile cues, Visual cues     General Comments General comments (skin integrity, edema, etc.): son and grandson in ther oom ands upportive, RR in 20's with activity though SpO2 >90% on 4L O2; HR mid 60's to mid 100's with activity    Exercises     Assessment/Plan    PT Assessment Patient needs continued PT services  PT Problem List Decreased strength;Decreased mobility;Decreased safety awareness;Decreased activity tolerance;Decreased balance;Decreased knowledge of use of DME;Decreased cognition;Cardiopulmonary status limiting activity;Decreased knowledge of precautions       PT Treatment Interventions DME instruction;Therapeutic exercise;Gait training;Balance training;Functional mobility training;Therapeutic activities;Patient/family education;Cognitive remediation    PT Goals (Current goals can be found in the Care Plan  section)  Acute Rehab PT Goals Patient Stated Goal: improve mobility PT Goal Formulation: With patient/family Time For Goal Achievement: 05/29/23 Potential to Achieve Goals: Fair    Frequency Min 2X/week     Co-evaluation PT/OT/SLP Co-Evaluation/Treatment: Yes Reason for Co-Treatment: Complexity of the patient's impairments (multi-system involvement);For patient/therapist safety PT goals addressed during session: Mobility/safety with mobility;Balance;Proper use of DME;Strengthening/ROM OT goals addressed during session: Strengthening/ROM;ADL's and self-care       AM-PAC PT "6 Clicks" Mobility  Outcome Measure Help needed turning from your back to your side while in a flat bed without using bedrails?: A Lot Help needed moving from lying on your back to sitting on the side of a flat bed without using bedrails?: Total Help needed moving to and from a bed to a chair (including a wheelchair)?: Total Help needed standing up from a chair using your arms (e.g., wheelchair or bedside chair)?: Total Help needed to walk in hospital room?: Total Help needed climbing 3-5 steps with a railing? : Total 6 Click Score: 7    End of Session Equipment Utilized During Treatment: Gait belt;Oxygen Activity Tolerance: Patient tolerated treatment well;Patient limited by fatigue Patient left: in chair;with chair alarm set;with family/visitor present;with bed alarm set;with call bell/phone within reach Nurse Communication: Mobility status PT Visit Diagnosis: Other abnormalities of gait and mobility (R26.89);Muscle weakness (generalized) (M62.81);Other symptoms and signs involving the nervous system (R29.898)    Time:  6213-0865 PT Time Calculation (min) (ACUTE ONLY): 33 min   Charges:   PT Evaluation $PT Eval Moderate Complexity: 1 Mod   PT General Charges $$ ACUTE PT VISIT: 1 Visit         Sheran Lawless, PT Acute Rehabilitation Services [phone] 05/15/2023   Elray Mcgregor 05/15/2023, 5:59 PM

## 2023-05-15 NOTE — Progress Notes (Signed)
 STROKE TEAM PROGRESS NOTE   SUBJECTIVE (INTERVAL HISTORY) Patient son and grandson are at bedside.  Patient is seated in chair, global aphasia, right facial droop and right hemiplegia.  MRI showed left MCA scattered infarcts.   OBJECTIVE Temp:  [97.2 F (36.2 C)-98.9 F (37.2 C)] 98 F (36.7 C) (03/06 0800) Pulse Rate:  [60-137] 62 (03/06 1000) Cardiac Rhythm: Other (Comment) (03/06 0800) Resp:  [16-28] 17 (03/06 1000) BP: (95-151)/(41-112) 144/50 (03/06 1000) SpO2:  [92 %-99 %] 96 % (03/06 1000)  Recent Labs  Lab 05/14/23 1954 05/14/23 2345 05/15/23 0350 05/15/23 0731 05/15/23 1114  GLUCAP 93 104* 101* 99 97   Recent Labs  Lab 05/13/23 1518 05/13/23 1526 05/13/23 1857 05/14/23 0155 05/14/23 0455 05/15/23 0538  NA 141 143 144 140 142 140  K 4.1 4.1 3.8 3.3* 3.7 4.1  CL 109 113*  --   --  108 112*  CO2 17*  --   --   --  19* 15*  GLUCOSE 138* 135*  --   --  145* 91  BUN 29* 31*  --   --  31* 35*  CREATININE 1.24* 1.10*  --   --  1.19* 2.34*  CALCIUM 9.5  --   --   --  9.0 8.6*  MG 2.0  --   --   --   --   --    Recent Labs  Lab 05/13/23 1518 05/14/23 0455 05/15/23 0538  AST 74* 43* 33  ALT 50* 41 37  ALKPHOS 98 79 70  BILITOT 1.3* 0.7 0.6  PROT 6.8 5.8* 5.7*  ALBUMIN 3.8 3.2* 3.5   Recent Labs  Lab 05/13/23 1518 05/13/23 1526 05/13/23 1857 05/14/23 0155 05/14/23 0455 05/15/23 0538  WBC 14.0*  --   --   --  10.6* 8.9  NEUTROABS  --   --   --   --  8.9*  --   HGB 15.6* 16.7* 15.3* 13.6 13.1 10.7*  HCT 49.2* 49.0* 45.0 40.0 41.4 34.2*  MCV 98.0  --   --   --  97.6 99.7  PLT 206  --   --   --  170 155   Recent Labs  Lab 05/13/23 1518 05/14/23 0455  CKTOTAL 1,135* 472*   Recent Labs    05/13/23 1518 05/14/23 0455  LABPROT 14.9 14.9  INR 1.2 1.2   Recent Labs    05/13/23 1900 05/15/23 0933  COLORURINE YELLOW YELLOW  LABSPEC 1.030 >1.046*  PHURINE 5.0 5.0  GLUCOSEU NEGATIVE NEGATIVE  HGBUR MODERATE* MODERATE*  BILIRUBINUR NEGATIVE  NEGATIVE  KETONESUR 20* NEGATIVE  PROTEINUR >=300* 100*  NITRITE POSITIVE* NEGATIVE  LEUKOCYTESUR LARGE* TRACE*       Component Value Date/Time   CHOL 172 05/14/2023 0455   TRIG 189 (H) 05/14/2023 0455   HDL 67 05/14/2023 0455   CHOLHDL 2.6 05/14/2023 0455   VLDL 38 05/14/2023 0455   LDLCALC 67 05/14/2023 0455   Lab Results  Component Value Date   HGBA1C 5.4 05/14/2023   No results found for: "LABOPIA", "COCAINSCRNUR", "LABBENZ", "AMPHETMU", "THCU", "LABBARB"  Recent Labs  Lab 05/13/23 1518  ETH <10    I have personally reviewed the radiological images below and agree with the radiology interpretations.  IR CT Head Ltd Result Date: 05/15/2023 INDICATION: New onset right-sided hemiplegia and aphasia. 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 EXAM: 1. EMERGENT LARGE VESSEL OCCLUSION THROMBOLYSIS (anterior CIRCULATION) COMPARISON:  CT angiogram of the  head and neck of May 13, 2023. MEDICATIONS: No antibiotic was administered within 1 hour of the procedure. ANESTHESIA/SEDATION: General anesthesia. CONTRAST:  Omnipaque 300 approximately 120 mL. FLUOROSCOPY TIME:  Fluoroscopy Time: 30 minutes 0 seconds (7419 mGy). COMPLICATIONS: None immediate. TECHNIQUE: Following a full explanation of the procedure along with the potential associated complications, an informed witnessed consent was obtained. The risks of intracranial hemorrhage of 10%, worsening neurological deficit, ventilator dependency, death and inability to revascularize were all reviewed in detail with the patient's son. The patient was then put under general anesthesia by the Department of Anesthesiology at [facility]. The right groin was prepped and draped in the usual sterile fashion. Thereafter using modified Seldinger technique, transfemoral access into the right common femoral artery was obtained without difficulty. Over an 0.035 inch guidewire an 8 French 25 cm Pinnacle sheath was  inserted. Through this, and also over an 0.035 inch guidewire a combination of a support 088 100 cm Zoom catheter and a 125 cm Simmons 2 support catheter was advanced to the aortic arch region and selectively positioned in the left common carotid artery. Arteriograms were then performed centered extra cranially and intracranially. FINDINGS: Left common carotid arteriogram demonstrates the left external carotid artery and its major branches to be widely patent. The left internal carotid artery at the bulb to the cranial skull base is widely patent. The petrous, the cavernous and the supraclinoid segments demonstrate wide patency. The left anterior cerebral artery opacifies into the capillary and venous phases. The left middle cerebral artery demonstrates patency of the proximal 2/3 of the M1 segment. The distal 1/3 demonstrates occlusion. PROCEDURE: Through the Zoom 088 100 cm support catheter in the cervical petrous junction, a combination of an 071 132 cm Zoom with an 021 150 cm Phenom microcatheter was advanced over an 018 inch standard micro guidewire with a moderate J configuration to the supraclinoid left ICA. The micro guidewire was then advanced to the distal left M1 segment followed by the microcatheter followed by the 071 Zoom aspiration catheter. The Zoom aspiration catheter was then engaged into the occluded distal M1 segment. Advancement of the microcatheter through the occluded distal M1 segment into the inferior over the superior divisions was met with significant resistance. The microcatheter and micro guidewire were removed. Constant aspiration was then applied at the hub of the Zoom aspiration catheter for approximately 2 minutes. Thereafter, with proximal aspiration, the combination of the Zoom aspiration catheter was retrieved and removed. A control arteriogram performed through the 088 Zoom support catheter demonstrated improved recanalization of the distal M1 with a few minor opacifying distal  to this. A second pass was then made using an 062 134 cm Penumbra aspiration catheter with an 043 distal aspiration over an 018 inch micro guidewire. The microcatheter was again advanced to the mildly recanalized distal M1 segment followed by the microcatheter and the 062 Zoom aspiration catheter which was advanced into the occluded left M1 segment. Advancement of the micro guidewire through the firm clot was met with resistance. The microcatheter and micro guidewire were removed. Constant aspiration was then applied at the hub of the 062 Penumbra aspiration catheter for 2 minutes. A control arteriogram performed through the Zoom support catheter guidewire now demonstrated partial revascularization of the inferior division. A third pass was then made using the 062 Penumbra aspiration catheter with an 021 150 cm Phenom microcatheter which was advanced over an 018 inch micro guidewire with a moderate J configuration to the distal M1 segment. The  micro guidewire was then gently advanced without difficulty through the inferior division of the M2 M3 region followed by the microcatheter. The micro guidewire was removed. Good aspiration obtained from the hub of the microcatheter. A gentle control arteriogram performed through this demonstrates safe positioning of the tip of the microcatheter. A 4 mm x 40 mm Solitaire X retrieval device was then advanced to the distal end of the microcatheter and deployed in the usual manner such that the proximal portion covered the distal M1 segment. The 062 aspiration catheter was now engaged into the origin of the inferior division. A constant aspiration was applied at the hub of the 062 aspiration catheter for approximately 2 minutes. Thereafter the combination of the retrieval device and the aspiration catheter was retrieved and removed. A few more chunks of clot were noted in the aspirate. A control arteriogram performed through the support catheter in the left internal carotid  artery now demonstrated revascularization of the left MCA branches achieving a TICI 3 revascularization. Also noted at this time was a small filling defect just distal to the origin of the middle branch of the MCA bifurcation. This also demonstrated moderate spasm at its origin which responded to 4 aliquots of 25 mcg of nitroglycerin intra-arterially. The small filling defect in the proximal aspect of the superior division was then cleared with another pass with the 062 Penumbra aspiration catheter advanced in combination with a 150 cm Phenom micro catheter over an 018 inch micro guidewire. Aspiration was applied right at the face of the filling defect for 30 seconds. A control arteriogram performed following removal of the aspiration catheters demonstrated clearance of the filling defect with mild residual vasospasm which again responded to 25 mcg of nitroglycerin intra-arterially. A TICI 3 revascularization was achieved. Patency was maintained of the left anterior cerebral artery distribution. A final control arteriogram performed through the Zoom support catheter in the distal left common carotid artery demonstrated patency of the left internal carotid artery extra cranially and intracranially. The intracranial vasculature remained widely patent without any filling defects. An 8 French Angio-Seal closure device was deployed for hemostasis at the at the right groin puncture site. Distal pulses remained present in both feet unchanged compared to prior to the procedure. A flat panel CT of the brain demonstrated no evidence of intracranial hemorrhage. The patient was left intubated due to patient's initial neurologic condition. She was then transferred to the neuro ICU for post revascularization care. IMPRESSION: Status post endovascular complete revascularization of occluded distal left middle cerebral artery M1 segment, and the proximal portions of the superior and the inferior divisions with 3 passes of contact  aspiration and 1 pass with a 4 mm x 40 mm Solitaire X retrieval device and contact aspiration achieving a TICI 3 revascularization. PLAN: Follow-up as per referring MD. Electronically Signed   By: Julieanne Cotton M.D.   On: 05/15/2023 08:27   IR PERCUTANEOUS ART THROMBECTOMY/INFUSION INTRACRANIAL INC DIAG ANGIO Result Date: 05/15/2023 INDICATION: New onset right-sided hemiplegia and aphasia. 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 EXAM: 1. EMERGENT LARGE VESSEL OCCLUSION THROMBOLYSIS (anterior CIRCULATION) COMPARISON:  CT angiogram of the head and neck of May 13, 2023. MEDICATIONS: No antibiotic was administered within 1 hour of the procedure. ANESTHESIA/SEDATION: General anesthesia. CONTRAST:  Omnipaque 300 approximately 120 mL. FLUOROSCOPY TIME:  Fluoroscopy Time: 30 minutes 0 seconds (7419 mGy). COMPLICATIONS: None immediate. TECHNIQUE: Following a full explanation of the procedure along with the potential associated complications, an  informed witnessed consent was obtained. The risks of intracranial hemorrhage of 10%, worsening neurological deficit, ventilator dependency, death and inability to revascularize were all reviewed in detail with the patient's son. The patient was then put under general anesthesia by the Department of Anesthesiology at [facility]. The right groin was prepped and draped in the usual sterile fashion. Thereafter using modified Seldinger technique, transfemoral access into the right common femoral artery was obtained without difficulty. Over an 0.035 inch guidewire an 8 French 25 cm Pinnacle sheath was inserted. Through this, and also over an 0.035 inch guidewire a combination of a support 088 100 cm Zoom catheter and a 125 cm Simmons 2 support catheter was advanced to the aortic arch region and selectively positioned in the left common carotid artery. Arteriograms were then performed centered extra cranially and intracranially. FINDINGS:  Left common carotid arteriogram demonstrates the left external carotid artery and its major branches to be widely patent. The left internal carotid artery at the bulb to the cranial skull base is widely patent. The petrous, the cavernous and the supraclinoid segments demonstrate wide patency. The left anterior cerebral artery opacifies into the capillary and venous phases. The left middle cerebral artery demonstrates patency of the proximal 2/3 of the M1 segment. The distal 1/3 demonstrates occlusion. PROCEDURE: Through the Zoom 088 100 cm support catheter in the cervical petrous junction, a combination of an 071 132 cm Zoom with an 021 150 cm Phenom microcatheter was advanced over an 018 inch standard micro guidewire with a moderate J configuration to the supraclinoid left ICA. The micro guidewire was then advanced to the distal left M1 segment followed by the microcatheter followed by the 071 Zoom aspiration catheter. The Zoom aspiration catheter was then engaged into the occluded distal M1 segment. Advancement of the microcatheter through the occluded distal M1 segment into the inferior over the superior divisions was met with significant resistance. The microcatheter and micro guidewire were removed. Constant aspiration was then applied at the hub of the Zoom aspiration catheter for approximately 2 minutes. Thereafter, with proximal aspiration, the combination of the Zoom aspiration catheter was retrieved and removed. A control arteriogram performed through the 088 Zoom support catheter demonstrated improved recanalization of the distal M1 with a few minor opacifying distal to this. A second pass was then made using an 062 134 cm Penumbra aspiration catheter with an 043 distal aspiration over an 018 inch micro guidewire. The microcatheter was again advanced to the mildly recanalized distal M1 segment followed by the microcatheter and the 062 Zoom aspiration catheter which was advanced into the occluded left M1  segment. Advancement of the micro guidewire through the firm clot was met with resistance. The microcatheter and micro guidewire were removed. Constant aspiration was then applied at the hub of the 062 Penumbra aspiration catheter for 2 minutes. A control arteriogram performed through the Zoom support catheter guidewire now demonstrated partial revascularization of the inferior division. A third pass was then made using the 062 Penumbra aspiration catheter with an 021 150 cm Phenom microcatheter which was advanced over an 018 inch micro guidewire with a moderate J configuration to the distal M1 segment. The micro guidewire was then gently advanced without difficulty through the inferior division of the M2 M3 region followed by the microcatheter. The micro guidewire was removed. Good aspiration obtained from the hub of the microcatheter. A gentle control arteriogram performed through this demonstrates safe positioning of the tip of the microcatheter. A 4 mm x 40 mm  Solitaire X retrieval device was then advanced to the distal end of the microcatheter and deployed in the usual manner such that the proximal portion covered the distal M1 segment. The 062 aspiration catheter was now engaged into the origin of the inferior division. A constant aspiration was applied at the hub of the 062 aspiration catheter for approximately 2 minutes. Thereafter the combination of the retrieval device and the aspiration catheter was retrieved and removed. A few more chunks of clot were noted in the aspirate. A control arteriogram performed through the support catheter in the left internal carotid artery now demonstrated revascularization of the left MCA branches achieving a TICI 3 revascularization. Also noted at this time was a small filling defect just distal to the origin of the middle branch of the MCA bifurcation. This also demonstrated moderate spasm at its origin which responded to 4 aliquots of 25 mcg of nitroglycerin  intra-arterially. The small filling defect in the proximal aspect of the superior division was then cleared with another pass with the 062 Penumbra aspiration catheter advanced in combination with a 150 cm Phenom micro catheter over an 018 inch micro guidewire. Aspiration was applied right at the face of the filling defect for 30 seconds. A control arteriogram performed following removal of the aspiration catheters demonstrated clearance of the filling defect with mild residual vasospasm which again responded to 25 mcg of nitroglycerin intra-arterially. A TICI 3 revascularization was achieved. Patency was maintained of the left anterior cerebral artery distribution. A final control arteriogram performed through the Zoom support catheter in the distal left common carotid artery demonstrated patency of the left internal carotid artery extra cranially and intracranially. The intracranial vasculature remained widely patent without any filling defects. An 8 French Angio-Seal closure device was deployed for hemostasis at the at the right groin puncture site. Distal pulses remained present in both feet unchanged compared to prior to the procedure. A flat panel CT of the brain demonstrated no evidence of intracranial hemorrhage. The patient was left intubated due to patient's initial neurologic condition. She was then transferred to the neuro ICU for post revascularization care. IMPRESSION: Status post endovascular complete revascularization of occluded distal left middle cerebral artery M1 segment, and the proximal portions of the superior and the inferior divisions with 3 passes of contact aspiration and 1 pass with a 4 mm x 40 mm Solitaire X retrieval device and contact aspiration achieving a TICI 3 revascularization. PLAN: Follow-up as per referring MD. Electronically Signed   By: Julieanne Cotton M.D.   On: 05/15/2023 08:27   MR BRAIN WO CONTRAST Result Date: 05/14/2023 CLINICAL DATA:  Stroke, follow up EXAM: MRI  HEAD WITHOUT CONTRAST MRA HEAD WITHOUT CONTRAST TECHNIQUE: Multiplanar, multi-echo pulse sequences of the brain and surrounding structures were acquired without intravenous contrast. Angiographic images of the Circle of Willis were acquired using MRA technique without intravenous contrast. COMPARISON:  CTA head/neck May 13, 2023. FINDINGS: MRI HEAD FINDINGS Brain: Many acute Infarcts throughout the left MCA territory. Associated edema without substantial mass effect. No midline shift. Remote left PCA territory infarct. Patchy T2/FLAIR hyperintensities the white matter, compatible with chronic microvascular ischemic disease. Remote left cerebellar infarct. No mass lesion. Cerebral atrophy. Vascular: See below. Skull and upper cervical spine: Normal marrow signal. Sinuses/Orbits: Mild paranasal sinus mucosal thickening. No acute orbital findings. Other: No mastoid effusions. MRA HEAD FINDINGS Anterior circulation: Bilateral intracranial ICAs, MCAs, and ACAs are patent without proximal hemodynamically significant stenosis. Previously seen distal left M1 MCA stenosis versus occlusion  now appears patent. Posterior circulation: Bilateral intradural vertebral arteries, basilar artery and bilateral posterior cerebral arteries are patent without proximal hemodynamically significant stenosis. IMPRESSION: 1. Multiple acute infarcts throughout the left MCA territory. 2. Previously seen distal left M1 MCA stenosis versus occlusion now appears patent. No large vessel occlusion or proximal hemodynamically significant stenosis. Electronically Signed   By: Feliberto Harts M.D.   On: 05/14/2023 19:31   MR ANGIO HEAD WO CONTRAST Result Date: 05/14/2023 CLINICAL DATA:  Stroke, follow up EXAM: MRI HEAD WITHOUT CONTRAST MRA HEAD WITHOUT CONTRAST TECHNIQUE: Multiplanar, multi-echo pulse sequences of the brain and surrounding structures were acquired without intravenous contrast. Angiographic images of the Circle of Willis were  acquired using MRA technique without intravenous contrast. COMPARISON:  CTA head/neck May 13, 2023. FINDINGS: MRI HEAD FINDINGS Brain: Many acute Infarcts throughout the left MCA territory. Associated edema without substantial mass effect. No midline shift. Remote left PCA territory infarct. Patchy T2/FLAIR hyperintensities the white matter, compatible with chronic microvascular ischemic disease. Remote left cerebellar infarct. No mass lesion. Cerebral atrophy. Vascular: See below. Skull and upper cervical spine: Normal marrow signal. Sinuses/Orbits: Mild paranasal sinus mucosal thickening. No acute orbital findings. Other: No mastoid effusions. MRA HEAD FINDINGS Anterior circulation: Bilateral intracranial ICAs, MCAs, and ACAs are patent without proximal hemodynamically significant stenosis. Previously seen distal left M1 MCA stenosis versus occlusion now appears patent. Posterior circulation: Bilateral intradural vertebral arteries, basilar artery and bilateral posterior cerebral arteries are patent without proximal hemodynamically significant stenosis. IMPRESSION: 1. Multiple acute infarcts throughout the left MCA territory. 2. Previously seen distal left M1 MCA stenosis versus occlusion now appears patent. No large vessel occlusion or proximal hemodynamically significant stenosis. Electronically Signed   By: Feliberto Harts M.D.   On: 05/14/2023 19:31   DG Abd Portable 1V Result Date: 05/14/2023 CLINICAL DATA:  Check gastric catheter placement EXAM: PORTABLE ABDOMEN - 1 VIEW COMPARISON:  None Available. FINDINGS: Gastric catheter is noted within the stomach. Contrast is noted within the kidneys bilaterally. No free air is seen. IMPRESSION: Gastric catheter in the stomach. Electronically Signed   By: Alcide Clever M.D.   On: 05/14/2023 02:54   Portable Chest x-ray Result Date: 05/14/2023 CLINICAL DATA:  Status post intubation EXAM: PORTABLE CHEST 1 VIEW COMPARISON:  Film from the previous day. FINDINGS:  Cardiac shadow is within normal limits. Pacing device is again seen. Endotracheal tube and gastric catheter are noted in satisfactory position. Lungs are free of acute infiltrate or effusion. IMPRESSION: No active disease. Electronically Signed   By: Alcide Clever M.D.   On: 05/14/2023 02:53   CT ANGIO HEAD NECK W WO CM W PERF Result Date: 05/13/2023 CLINICAL DATA:  Neuro deficit, acute, stroke suspected EXAM: CT ANGIOGRAPHY HEAD AND NECK CT PERFUSION BRAIN TECHNIQUE: Multidetector CT imaging of the head and neck was performed using the standard protocol during bolus administration of intravenous contrast. Multiplanar CT image reconstructions and MIPs were obtained to evaluate the vascular anatomy. Carotid stenosis measurements (when applicable) are obtained utilizing NASCET criteria, using the distal internal carotid diameter as the denominator. Multiphase CT imaging of the brain was performed following IV bolus contrast injection. Subsequent parametric perfusion maps were calculated using RAPID software. RADIATION DOSE REDUCTION: This exam was performed according to the departmental dose-optimization program which includes automated exposure control, adjustment of the mA and/or kV according to patient size and/or use of iterative reconstruction technique. CONTRAST:  OMNIPAQUE IOHEXOL 350 MG/ML SOLN COMPARISON:  Same day CT head. FINDINGS: CTA NECK  FINDINGS Aortic arch: Atherosclerosis.  Great vessel origins are patent. Right carotid system: Atherosclerosis at the carotid bifurcation without greater than 50% stenosis. Also, atherosclerosis at the skull base with approximately 60% stenosis. Left carotid system: Atherosclerosis without greater than 50% stenosis. Vertebral arteries: Left dominant. No significant (greater than 50%) stenosis. Skeleton: No acute abnormality on limited assessment. Other neck: No acute abnormality on limited assessment. Upper chest: Visualized lung apices are clear. Review of the  MIP images confirms the above findings CTA HEAD FINDINGS Anterior circulation: Bilateral intracranial ICAs are patent without significant stenosis. Right MCA and bilateral ACAs are patent without proximal hemodynamically significant stenosis. Distal left M1 MCA occlusion versus severe stenosis. Asymmetric opacification of the left MCA vessels more distally. Posterior circulation: Bilateral intradural vertebral arteries, basilar artery and bilateral posterior cerebral arteries are patent without proximal hemodynamically significant stenosis. Venous sinuses: As permitted by contrast timing, patent. Review of the MIP images confirms the above findings CT Brain Perfusion Findings: CBF (<30%) Volume: 0mL Perfusion (Tmax>6.0s) volume: 50mL Mismatch Volume: 50mL Infarction Location:None identified. IMPRESSION: 1. Occluded versus severely stenotic distal left M1 MCA with asymmetrically diminished distal left MCA opacification. Recommend code stroke activation and emergent neurology consultation. 2. Approximately 50 mL of left MCA penumbra without evidence of core infarct by CT perfusion. 3. Approximately 60% stenosis of the right ICA at the skull base. Findings and recommendations discussed with Dr. Wilkie Aye via telephone at Dr. Wilkie Aye. Electronically Signed   By: Feliberto Harts M.D.   On: 05/13/2023 21:40   DG Shoulder Right Portable Result Date: 05/13/2023 CLINICAL DATA:  Status post trauma. EXAM: RIGHT SHOULDER - 1 VIEW COMPARISON:  None Available. FINDINGS: Limited study secondary to numerous overlying radiopaque cardiac lead wires. There is no evidence of acute fracture or dislocation. Chronic changes are seen along the greater tubercle and inferior medial aspect of the right humeral. There are moderate severity degenerative changes. Soft tissues are unremarkable. IMPRESSION: 1. Limited study without evidence of acute fracture or dislocation. 2. Moderate severity chronic and degenerative changes. Electronically  Signed   By: Aram Candela M.D.   On: 05/13/2023 17:46   DG Pelvis Portable Result Date: 05/13/2023 CLINICAL DATA:  Status post trauma. EXAM: PORTABLE PELVIS 1-2 VIEWS COMPARISON:  March 05, 2016 FINDINGS: There is no evidence of pelvic fracture or diastasis. No pelvic bone lesions are seen. IMPRESSION: Negative. Electronically Signed   By: Aram Candela M.D.   On: 05/13/2023 17:45   DG Chest Port 1 View Result Date: 05/13/2023 CLINICAL DATA:  Status post trauma. EXAM: PORTABLE CHEST 1 VIEW COMPARISON:  December 17, 2022 FINDINGS: There is stable dual lead AICD positioning. The heart size and mediastinal contours are within normal limits. Marked severity calcification of the thoracic aorta is noted. There is no evidence of an acute infiltrate, pleural effusion or pneumothorax. Radiopaque surgical clips are seen overlying the esophageal hiatus. There is mild dextroscoliosis of the mid and lower thoracic spine with multilevel degenerative changes. IMPRESSION: No active cardiopulmonary disease. Electronically Signed   By: Aram Candela M.D.   On: 05/13/2023 17:44   CT CHEST ABDOMEN PELVIS W CONTRAST Result Date: 05/13/2023 CLINICAL DATA:  Found unresponsive, unknown down time EXAM: CT CHEST, ABDOMEN, AND PELVIS WITH CONTRAST CT THORACIC AND LUMBAR SPINE WITH CONTRAST TECHNIQUE: Multidetector CT imaging of the chest, abdomen and pelvis was performed following the standard protocol during bolus administration of intravenous contrast. Multidetector CT imaging of the thoracic and lumbar spine was performed following the standard protocol during  bolus administration of intravenous contrast. RADIATION DOSE REDUCTION: This exam was performed according to the departmental dose-optimization program which includes automated exposure control, adjustment of the mA and/or kV according to patient size and/or use of iterative reconstruction technique. CONTRAST:  75mL OMNIPAQUE IOHEXOL 350 MG/ML SOLN COMPARISON:  CT  abdomen pelvis, 04/22/2014 FINDINGS: CT CHEST FINDINGS Cardiovascular: Left chest multi lead pacer. Aortic atherosclerosis. Normal heart size. Scattered left and right coronary artery calcifications. No pericardial effusion. Mediastinum/Nodes: No enlarged mediastinal, hilar, or axillary lymph nodes. Thyroid gland, trachea, and esophagus demonstrate no significant findings. Lungs/Pleura: Lungs are clear. No pleural effusion or pneumothorax. Musculoskeletal: No chest wall mass or suspicious osseous lesions identified. CT ABDOMEN PELVIS FINDINGS Hepatobiliary: No focal liver abnormality is seen. Status post cholecystectomy. Similar severe postoperative biliary ductal dilatation. Pancreas: Unremarkable. No pancreatic ductal dilatation or surrounding inflammatory changes. Spleen: Normal in size without significant abnormality. Adrenals/Urinary Tract: Adrenal glands are unremarkable. Kidneys are normal, without renal calculi, solid lesion, or hydronephrosis. Bladder is unremarkable. Stomach/Bowel: Stomach is within normal limits. Appendix not clearly visualized. No evidence of bowel wall thickening, distention, or inflammatory changes. Vascular/Lymphatic: Aortic atherosclerosis. 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 No enlarged abdominal or pelvic lymph nodes. Reproductive: No mass or other abnormality. Other: No abdominal wall hernia or abnormality. No ascites. Musculoskeletal: No acute osseous findings. CT THORACIC AND LUMBAR SPINE FINDINGS Alignment: Normal thoracic kyphosis. Normal lumbar lordosis. Vertebral bodies: No acute fracture or dislocation. Unchanged mild superior endplate wedge deformity of L2, with approximately 20% height loss. Disc spaces: Generally mild multilevel disc space height loss and osteophytosis throughout the thoracic and lumbar spine, focally moderate at L3-L4. Paraspinous soft tissues: Unremarkable. IMPRESSION: 1. No CT  evidence of acute traumatic injury to the chest, abdomen, or pelvis. 2. No acute fracture or dislocation of the thoracic or lumbar spine. Unchanged mild, chronic wedge deformity of L2. 3. Status post cholecystectomy. Similar severe postoperative biliary ductal dilatation. 4. Coronary artery disease. Aortic Atherosclerosis (ICD10-I70.0). Electronically Signed   By: Jearld Lesch M.D.   On: 05/13/2023 17:28   CT L-SPINE NO CHARGE Result Date: 05/13/2023 CLINICAL DATA:  Found unresponsive, unknown down time EXAM: CT CHEST, ABDOMEN, AND PELVIS WITH CONTRAST CT THORACIC AND LUMBAR SPINE WITH CONTRAST TECHNIQUE: Multidetector CT imaging of the chest, abdomen and pelvis was performed following the standard protocol during bolus administration of intravenous contrast. Multidetector CT imaging of the thoracic and lumbar spine was performed following the standard protocol during bolus administration of intravenous contrast. RADIATION DOSE REDUCTION: This exam was performed according to the departmental dose-optimization program which includes automated exposure control, adjustment of the mA and/or kV according to patient size and/or use of iterative reconstruction technique. CONTRAST:  75mL OMNIPAQUE IOHEXOL 350 MG/ML SOLN COMPARISON:  CT abdomen pelvis, 04/22/2014 FINDINGS: CT CHEST FINDINGS Cardiovascular: Left chest multi lead pacer. Aortic atherosclerosis. Normal heart size. Scattered left and right coronary artery calcifications. No pericardial effusion. Mediastinum/Nodes: No enlarged mediastinal, hilar, or axillary lymph nodes. Thyroid gland, trachea, and esophagus demonstrate no significant findings. Lungs/Pleura: Lungs are clear. No pleural effusion or pneumothorax. Musculoskeletal: No chest wall mass or suspicious osseous lesions identified. CT ABDOMEN PELVIS FINDINGS Hepatobiliary: No focal liver abnormality is seen. Status post cholecystectomy. Similar severe postoperative biliary ductal dilatation. Pancreas:  Unremarkable. No pancreatic ductal dilatation or surrounding inflammatory changes. Spleen: Normal in size without significant abnormality. Adrenals/Urinary Tract: Adrenal glands are unremarkable. Kidneys are normal, without renal calculi, solid lesion,  or hydronephrosis. Bladder is unremarkable. Stomach/Bowel: Stomach is within normal limits. Appendix not clearly visualized. No evidence of bowel wall thickening, distention, or inflammatory changes. Vascular/Lymphatic: Aortic atherosclerosis. 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 No enlarged abdominal or pelvic lymph nodes. Reproductive: No mass or other abnormality. Other: No abdominal wall hernia or abnormality. No ascites. Musculoskeletal: No acute osseous findings. CT THORACIC AND LUMBAR SPINE FINDINGS Alignment: Normal thoracic kyphosis. Normal lumbar lordosis. Vertebral bodies: No acute fracture or dislocation. Unchanged mild superior endplate wedge deformity of L2, with approximately 20% height loss. Disc spaces: Generally mild multilevel disc space height loss and osteophytosis throughout the thoracic and lumbar spine, focally moderate at L3-L4. Paraspinous soft tissues: Unremarkable. IMPRESSION: 1. No CT evidence of acute traumatic injury to the chest, abdomen, or pelvis. 2. No acute fracture or dislocation of the thoracic or lumbar spine. Unchanged mild, chronic wedge deformity of L2. 3. Status post cholecystectomy. Similar severe postoperative biliary ductal dilatation. 4. Coronary artery disease. Aortic Atherosclerosis (ICD10-I70.0). Electronically Signed   By: Jearld Lesch M.D.   On: 05/13/2023 17:28   CT T-SPINE NO CHARGE Result Date: 05/13/2023 CLINICAL DATA:  Found unresponsive, unknown down time EXAM: CT CHEST, ABDOMEN, AND PELVIS WITH CONTRAST CT THORACIC AND LUMBAR SPINE WITH CONTRAST TECHNIQUE: Multidetector CT imaging of the chest, abdomen and pelvis was performed  following the standard protocol during bolus administration of intravenous contrast. Multidetector CT imaging of the thoracic and lumbar spine was performed following the standard protocol during bolus administration of intravenous contrast. RADIATION DOSE REDUCTION: This exam was performed according to the departmental dose-optimization program which includes automated exposure control, adjustment of the mA and/or kV according to patient size and/or use of iterative reconstruction technique. CONTRAST:  75mL OMNIPAQUE IOHEXOL 350 MG/ML SOLN COMPARISON:  CT abdomen pelvis, 04/22/2014 FINDINGS: CT CHEST FINDINGS Cardiovascular: Left chest multi lead pacer. Aortic atherosclerosis. Normal heart size. Scattered left and right coronary artery calcifications. No pericardial effusion. Mediastinum/Nodes: No enlarged mediastinal, hilar, or axillary lymph nodes. Thyroid gland, trachea, and esophagus demonstrate no significant findings. Lungs/Pleura: Lungs are clear. No pleural effusion or pneumothorax. Musculoskeletal: No chest wall mass or suspicious osseous lesions identified. CT ABDOMEN PELVIS FINDINGS Hepatobiliary: No focal liver abnormality is seen. Status post cholecystectomy. Similar severe postoperative biliary ductal dilatation. Pancreas: Unremarkable. No pancreatic ductal dilatation or surrounding inflammatory changes. Spleen: Normal in size without significant abnormality. Adrenals/Urinary Tract: Adrenal glands are unremarkable. Kidneys are normal, without renal calculi, solid lesion, or hydronephrosis. Bladder is unremarkable. Stomach/Bowel: Stomach is within normal limits. Appendix not clearly visualized. No evidence of bowel wall thickening, distention, or inflammatory changes. Vascular/Lymphatic: Aortic atherosclerosis. 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 No enlarged abdominal or pelvic lymph nodes. Reproductive: No mass or other  abnormality. Other: No abdominal wall hernia or abnormality. No ascites. Musculoskeletal: No acute osseous findings. CT THORACIC AND LUMBAR SPINE FINDINGS Alignment: Normal thoracic kyphosis. Normal lumbar lordosis. Vertebral bodies: No acute fracture or dislocation. Unchanged mild superior endplate wedge deformity of L2, with approximately 20% height loss. Disc spaces: Generally mild multilevel disc space height loss and osteophytosis throughout the thoracic and lumbar spine, focally moderate at L3-L4. Paraspinous soft tissues: Unremarkable. IMPRESSION: 1. No CT evidence of acute traumatic injury to the chest, abdomen, or pelvis. 2. No acute fracture or dislocation of the thoracic or lumbar spine. Unchanged mild, chronic wedge deformity of L2. 3. Status post  cholecystectomy. Similar severe postoperative biliary ductal dilatation. 4. Coronary artery disease. Aortic Atherosclerosis (ICD10-I70.0). Electronically Signed   By: Jearld Lesch M.D.   On: 05/13/2023 17:28   CT HEAD WO CONTRAST Result Date: 05/13/2023 CLINICAL DATA:  Found unresponsive, unknown downtime EXAM: CT HEAD WITHOUT CONTRAST CT MAXILLOFACIAL WITHOUT CONTRAST CT CERVICAL SPINE WITHOUT CONTRAST TECHNIQUE: Multidetector CT imaging of the head, cervical spine, and maxillofacial structures were performed using the standard protocol without intravenous contrast. Multiplanar CT image reconstructions of the cervical spine and maxillofacial structures were also generated. RADIATION DOSE REDUCTION: This exam was performed according to the departmental dose-optimization program which includes automated exposure control, adjustment of the mA and/or kV according to patient size and/or use of iterative reconstruction technique. COMPARISON:  04/10/2023 FINDINGS: CT HEAD FINDINGS Brain: No evidence of acute infarction, hemorrhage, hydrocephalus, extra-axial collection or mass lesion/mass effect. Mild periventricular and deep white matter hypodensity. Unchanged  left occipital encephalomalacia. Vascular: No hyperdense vessel or unexpected calcification. CT FACIAL BONES FINDINGS Skull: Normal. Negative for fracture or focal lesion. Facial bones: No displaced fractures or dislocations. Sinuses/Orbits: Small air-fluid level in the right maxillary sinus. No overlying fracture. Other: Patient is partially edentulous with erosion of the left maxillary alveolar bone into the left maxillary sinus (series 7, image 31). CT CERVICAL SPINE FINDINGS Alignment: Normal. Skull base and vertebrae: No acute fracture. No primary bone lesion or focal pathologic process. Soft tissues and spinal canal: No prevertebral fluid or swelling. No visible canal hematoma. Disc levels: Focally moderate disc space height loss and osteophytosis of the lower cervical levels C5-C7 Upper chest: Negative. Other: None. IMPRESSION: 1. No acute intracranial pathology. Small-vessel white matter disease and unchanged left occipital encephalomalacia. 2. No displaced fractures or dislocations of the facial bones. 3. Small air-fluid level in the right maxillary sinus. No overlying fracture. Patient is partially edentulous with erosion of the left maxillary alveolar bone into the left maxillary sinus. Correlate for sinusitis. 4. No fracture or static subluxation of the cervical spine. 5. Focally moderate disc space height loss and osteophytosis of the lower cervical levels. Electronically Signed   By: Jearld Lesch M.D.   On: 05/13/2023 17:20   CT CERVICAL SPINE WO CONTRAST Result Date: 05/13/2023 CLINICAL DATA:  Found unresponsive, unknown downtime EXAM: CT HEAD WITHOUT CONTRAST CT MAXILLOFACIAL WITHOUT CONTRAST CT CERVICAL SPINE WITHOUT CONTRAST TECHNIQUE: Multidetector CT imaging of the head, cervical spine, and maxillofacial structures were performed using the standard protocol without intravenous contrast. Multiplanar CT image reconstructions of the cervical spine and maxillofacial structures were also generated.  RADIATION DOSE REDUCTION: This exam was performed according to the departmental dose-optimization program which includes automated exposure control, adjustment of the mA and/or kV according to patient size and/or use of iterative reconstruction technique. COMPARISON:  04/10/2023 FINDINGS: CT HEAD FINDINGS Brain: No evidence of acute infarction, hemorrhage, hydrocephalus, extra-axial collection or mass lesion/mass effect. Mild periventricular and deep white matter hypodensity. Unchanged left occipital encephalomalacia. Vascular: No hyperdense vessel or unexpected calcification. CT FACIAL BONES FINDINGS Skull: Normal. Negative for fracture or focal lesion. Facial bones: No displaced fractures or dislocations. Sinuses/Orbits: Small air-fluid level in the right maxillary sinus. No overlying fracture. Other: Patient is partially edentulous with erosion of the left maxillary alveolar bone into the left maxillary sinus (series 7, image 31). CT CERVICAL SPINE FINDINGS Alignment: Normal. Skull base and vertebrae: No acute fracture. No primary bone lesion or focal pathologic process. Soft tissues and spinal canal: No prevertebral fluid or swelling. No visible canal hematoma. Disc  levels: Focally moderate disc space height loss and osteophytosis of the lower cervical levels C5-C7 Upper chest: Negative. Other: None. IMPRESSION: 1. No acute intracranial pathology. Small-vessel white matter disease and unchanged left occipital encephalomalacia. 2. No displaced fractures or dislocations of the facial bones. 3. Small air-fluid level in the right maxillary sinus. No overlying fracture. Patient is partially edentulous with erosion of the left maxillary alveolar bone into the left maxillary sinus. Correlate for sinusitis. 4. No fracture or static subluxation of the cervical spine. 5. Focally moderate disc space height loss and osteophytosis of the lower cervical levels. Electronically Signed   By: Jearld Lesch M.D.   On: 05/13/2023  17:20   CT MAXILLOFACIAL WO CONTRAST Result Date: 05/13/2023 CLINICAL DATA:  Found unresponsive, unknown downtime EXAM: CT HEAD WITHOUT CONTRAST CT MAXILLOFACIAL WITHOUT CONTRAST CT CERVICAL SPINE WITHOUT CONTRAST TECHNIQUE: Multidetector CT imaging of the head, cervical spine, and maxillofacial structures were performed using the standard protocol without intravenous contrast. Multiplanar CT image reconstructions of the cervical spine and maxillofacial structures were also generated. RADIATION DOSE REDUCTION: This exam was performed according to the departmental dose-optimization program which includes automated exposure control, adjustment of the mA and/or kV according to patient size and/or use of iterative reconstruction technique. COMPARISON:  04/10/2023 FINDINGS: CT HEAD FINDINGS Brain: No evidence of acute infarction, hemorrhage, hydrocephalus, extra-axial collection or mass lesion/mass effect. Mild periventricular and deep white matter hypodensity. Unchanged left occipital encephalomalacia. Vascular: No hyperdense vessel or unexpected calcification. CT FACIAL BONES FINDINGS Skull: Normal. Negative for fracture or focal lesion. Facial bones: No displaced fractures or dislocations. Sinuses/Orbits: Small air-fluid level in the right maxillary sinus. No overlying fracture. Other: Patient is partially edentulous with erosion of the left maxillary alveolar bone into the left maxillary sinus (series 7, image 31). CT CERVICAL SPINE FINDINGS Alignment: Normal. Skull base and vertebrae: No acute fracture. No primary bone lesion or focal pathologic process. Soft tissues and spinal canal: No prevertebral fluid or swelling. No visible canal hematoma. Disc levels: Focally moderate disc space height loss and osteophytosis of the lower cervical levels C5-C7 Upper chest: Negative. Other: None. IMPRESSION: 1. No acute intracranial pathology. Small-vessel white matter disease and unchanged left occipital encephalomalacia. 2.  No displaced fractures or dislocations of the facial bones. 3. Small air-fluid level in the right maxillary sinus. No overlying fracture. Patient is partially edentulous with erosion of the left maxillary alveolar bone into the left maxillary sinus. Correlate for sinusitis. 4. No fracture or static subluxation of the cervical spine. 5. Focally moderate disc space height loss and osteophytosis of the lower cervical levels. Electronically Signed   By: Jearld Lesch M.D.   On: 05/13/2023 17:20     PHYSICAL EXAM  Temp:  [97.2 F (36.2 C)-98.9 F (37.2 C)] 98 F (36.7 C) (03/06 0800) Pulse Rate:  [60-137] 62 (03/06 1000) Resp:  [16-28] 17 (03/06 1000) BP: (95-151)/(41-112) 144/50 (03/06 1000) SpO2:  [92 %-99 %] 96 % (03/06 1000)  General - Well nourished, well developed, not in distress  Ophthalmologic - fundi not visualized due to noncooperation.  Cardiovascular - irregularly irregular heart rate and rhythm.  Neuro - sitting in chair, awake, alert, eyes open, global aphasia, nonverbal and not following commands. Left gaze preference but able to cross midline, tracking more on the left, blinking to visual threat on the left but not consistent on the right. R facial droop with increased right palpebral fissure. Tongue protrusion not cooperative. LUE at least 3/5, RUE flaccid but withdraw to  pain. Bilaterally LEs proximal 3/5 and distal LLE 3/5 and RLE 3-/5. Sensation, coordination and gait not tested.    ASSESSMENT/PLAN Ms. SOSIE GATO is a 89 y.o. female with history of HFpEF, pacemaker, A-fib on Xarelto, HLD, HTN, CAD, PVD admitted for right side weakness, aphasia, found down at home. No TNK given due to on Xarelto.    Stroke:  left MCA scattered infarcts embolic secondary to A-fib even on Xarelto CT no acute abnormality, left occipital encephalomalacia CT head and neck left M1 occlusion.  60% stenosis left ICA at the skull base CTP 0/50 cc Status post IR left M1 occlusion with TICI3 MRI  multifocal acute infarcts throughout left MCA territory MRA distal left M1 MCA stenosis versus occlusion now patent 2D Echo pending LDL 67 HgbA1c 5.4 Lovenox for VTE prophylaxis Xarelto (rivaroxaban) daily prior to admission, now on aspirin 300 mg suppository daily.  Consider Eliquis once p.o. access Ongoing aggressive stroke risk factor management Therapy recommendations: SNF Disposition: Pending  A-fib RVR Cardiology on board on diltiazem IV  Now off amiodarone IV On metoprolol IV 2.5 mg every 6 hours Rate controlled  Respiratory failure, resolved Intubated for procedure Remain intubated postprocedure Extubated 05/14/2023 Currently tolerating well  Hypertension Stable Off Cleviprex BP goal less than 180/105 Long term BP goal normotensive  Hyperlipidemia Home meds: None LDL 67, goal < 70 Statin intolerance No statin needed given LDL at goal  AKI Creatinine 1.24--1.10--1.19--2.34 On IV fluid at 75 Management per primary team  Dysphagia Did not pass swallow On n.p.o. On IV fluid Consider core track tomorrow and tube feeding  Other Stroke Risk Factors Advanced age HFpEF CAD  Other Active Problems Pacemaker in place PUD Slight leukocytosis, WBC 10.6--8.9  Hospital day # 2  This patient is critically ill due to stroke status post thrombectomy, A-fib RVR, respiratory failure and at significant risk of neurological worsening, death form recurrent stroke, hemorrhagic transmission, heart failure, seizure. This patient's care requires constant monitoring of vital signs, hemodynamics, respiratory and cardiac monitoring, review of multiple databases, neurological assessment, discussion with family, other specialists and medical decision making of high complexity. I spent 40 minutes of neurocritical care time in the care of this patient. I had long discussion with son at bedside, updated pt current condition, treatment plan and potential prognosis, and answered all the  questions.  He expressed understanding and appreciation.    Marvel Plan, MD PhD Stroke Neurology 05/15/2023 11:43 AM    To contact Stroke Continuity provider, please refer to [email or web address]. After hours, contact General Neurology

## 2023-05-15 NOTE — Plan of Care (Signed)
  Problem: Coping: Goal: Will identify appropriate support needs Outcome: Progressing

## 2023-05-15 NOTE — Evaluation (Signed)
 Occupational Therapy Evaluation Patient Details Name: Ruth Roth MRN: [id number] DOB: 1928-12-23 Today's Date: 05/15/2023   History of Present Illness   89 year old female admitted 05/14/23 after being found down at home with R sided weakness, incontinent. CTA head and neck showed occluded versus severe stenosis left M1 MCA with a symmetrical diminished distal left MCA opacification. X rays negative for acute fx. Code stroke activated in the ED with increasing dysarthria. S/p IR revascularization. Dx with UTI and sepsis. Chest x-ray evidence of pneumonia. PMH includes HFpEF, pacemaker (July 2023), atrial fibrillation (on Xarelto), Autoimmune hepatitis, Fibrocystic breast disease, Glaucoma, Hypercholesterolemia, Hypertension, Hypothyroidism, Inflammatory arthritis, CAD, Mitral regurgitation, Neuropathy, Osteoarthritis and PUD.     Clinical Impressions Pt is typically independent in ADL and mobility. She drives, likes to have lunch out with friends. Still does cooking and cleaning. Today she presents with global aphasia, RUE appearing flaccid (one brief moment of movement during MMT) and has edema. She did not follow oral commands, visual demonstration of tasks, she did not verbalize or make any sounds during the session. Overall she is max A +2 for step pivot to recliner with assist from bed pad, max to total A for all aspects of ADL. Vision remains un-assessed due to inability to follow directions - but she did track therapist (especially starting on the left side). OT will continue to follow acutely. At this time recommending rehab of <3 hours daily to maximize safety and independence in ADL and functional transfers.      If plan is discharge home, recommend the following:   Two people to help with walking and/or transfers;A lot of help with bathing/dressing/bathroom;Assistance with feeding;Assistance with cooking/housework;Direct supervision/assist for medications management;Direct  supervision/assist for financial management;Assist for transportation;Help with stairs or ramp for entrance;Supervision due to cognitive status     Functional Status Assessment   Patient has had a recent decline in their functional status and demonstrates the ability to make significant improvements in function in a reasonable and predictable amount of time.     Equipment Recommendations   Wheelchair (measurements OT);Wheelchair cushion (measurements OT);BSC/3in1 (defer to next venue of care)     Recommendations for Other Services   PT consult;Speech consult     Precautions/Restrictions   Precautions Precautions: Fall Recall of Precautions/Restrictions: Impaired Precaution/Restrictions Comments: global aphasia Restrictions Weight Bearing Restrictions Per Provider Order: No     Mobility Bed Mobility Overal bed mobility: Needs Assistance Bed Mobility: Supine to Sit     Supine to sit: Max assist, +2 for physical assistance, +2 for safety/equipment, HOB elevated     General bed mobility comments: no initiation, use of bed pad to facilitate, assit for trunk elevation and BLE off the bed    Transfers Overall transfer level: Needs assistance Equipment used: 2 person hand held assist Transfers: Bed to chair/wheelchair/BSC       Step pivot transfers: Max assist, +2 physical assistance, +2 safety/equipment     General transfer comment: initially Pt in very flexed position encouraged by grandson to stand upright, then with max support from 2 therapist take short pivotal steps to recliner      Balance Overall balance assessment: Needs assistance Sitting-balance support: Bilateral upper extremity supported, Feet supported Sitting balance-Leahy Scale: Poor Sitting balance - Comments: min A to GCA   Standing balance support: Bilateral upper extremity supported Standing balance-Leahy Scale: Zero Standing balance comment: dependent on external assist  ADL either performed or assessed with clinical judgement   ADL Overall ADL's : Needs assistance/impaired Eating/Feeding: NPO   Grooming: Maximal assistance;Wash/dry face;Sitting Grooming Details (indicate cue type and reason): hand over hand - L Upper Body Bathing: Total assistance   Lower Body Bathing: Total assistance   Upper Body Dressing : Maximal assistance;Sitting   Lower Body Dressing: Total assistance   Toilet Transfer: Maximal assistance;+2 for physical assistance;+2 for safety/equipment;Stand-pivot Toilet Transfer Details (indicate cue type and reason): face to face, simulated through recliner transfer Toileting- Clothing Manipulation and Hygiene: Total assistance       Functional mobility during ADLs: Maximal assistance;+2 for physical assistance;+2 for safety/equipment General ADL Comments: impacted by global aphasia, no initiation required total A or hand over hand for all aspects     Vision Baseline Vision/History: 1 Wears glasses Additional Comments: Pt making eye contact, will track therapists - will continue to monitor     Perception Perception: Impaired Preception Impairment Details: Inattention/Neglect Perception-Other Comments: R inattention, but will track past midline, global aphasia impacting   Praxis Praxis: Impaired   Praxis-Other Comments: globally aphasic   Pertinent Vitals/Pain Pain Assessment Pain Assessment: Faces Faces Pain Scale: No hurt Pain Intervention(s): Monitored during session     Extremity/Trunk Assessment Upper Extremity Assessment Upper Extremity Assessment: RUE deficits/detail RUE Deficits / Details: edema present, overall seems flaccid - will let it drop out of the air, no direction following, once did very brief potentially recoil of biceps RUE Coordination: decreased fine motor;decreased gross motor   Lower Extremity Assessment Lower Extremity Assessment: Defer to PT evaluation        Communication Communication Communication: Impaired Factors Affecting Communication: Difficulty expressing self   Cognition Arousal: Alert Behavior During Therapy: Flat affect Cognition: Cognition impaired             OT - Cognition Comments: Pt with global aphasia, did not verbalize throughout session, did not follow commands, did not respond to familiar grooming items, generalized responses despite multimodal attempts at communication (did not attempt to use communication board)                 Following commands: Impaired Following commands impaired:  (does not follow commands)     Cueing  General Comments   Cueing Techniques: Verbal cues;Gestural cues;Tactile cues;Visual cues  son and grandson in the room throughout. RR high in 20's with activity SpO2 >90% (Pt mouth breather), HR from mid 60's to mid 100's with activity   Exercises     Shoulder Instructions      Home Living Family/patient expects to be discharged to:: Private residence Living Arrangements: Alone Available Help at Discharge: Family;Available PRN/intermittently Type of Home: House Home Access: Level entry     Home Layout: One level     Bathroom Shower/Tub: Walk-in Pensions consultant: Handicapped height Bathroom Accessibility: Yes How Accessible: Accessible via walker Home Equipment: Shower seat      Lives With: Alone    Prior Functioning/Environment Prior Level of Function : Independent/Modified Independent;Driving                    OT Problem List: Decreased strength;Decreased range of motion;Decreased activity tolerance;Impaired balance (sitting and/or standing);Decreased coordination;Decreased cognition;Decreased safety awareness;Decreased knowledge of use of DME or AE;Impaired sensation;Impaired UE functional use;Increased edema   OT Treatment/Interventions: Self-care/ADL training;Therapeutic exercise;Neuromuscular education;DME and/or AE  instruction;Manual therapy;Therapeutic activities;Cognitive remediation/compensation;Visual/perceptual remediation/compensation;Patient/family education;Balance training      OT Goals(Current goals can be found in the care plan  section)   Acute Rehab OT Goals Patient Stated Goal: none stated OT Goal Formulation: Patient unable to participate in goal setting Time For Goal Achievement: 05/29/23 Potential to Achieve Goals: Fair ADL Goals Pt Will Perform Grooming: with min assist;with caregiver independent in assisting;sitting Pt Will Perform Upper Body Dressing: with mod assist;with caregiver independent in assisting;sitting Pt Will Perform Lower Body Dressing: with max assist;with caregiver independent in assisting;sit to/from stand Pt Will Transfer to Toilet: with mod assist;stand pivot transfer;bedside commode Pt Will Perform Toileting - Clothing Manipulation and hygiene: with max assist;sit to/from stand;with caregiver independent in assisting   OT Frequency:  Min 2X/week    Co-evaluation PT/OT/SLP Co-Evaluation/Treatment: Yes Reason for Co-Treatment: Complexity of the patient's impairments (multi-system involvement);Necessary to address cognition/behavior during functional activity;For patient/therapist safety;To address functional/ADL transfers PT goals addressed during session: Mobility/safety with mobility;Balance;Proper use of DME;Strengthening/ROM OT goals addressed during session: Strengthening/ROM;ADL's and self-care      AM-PAC OT "6 Clicks" Daily Activity     Outcome Measure Help from another person eating meals?: Total Help from another person taking care of personal grooming?: Total Help from another person toileting, which includes using toliet, bedpan, or urinal?: Total Help from another person bathing (including washing, rinsing, drying)?: Total Help from another person to put on and taking off regular upper body clothing?: Total Help from another person to put on  and taking off regular lower body clothing?: Total 6 Click Score: 6   End of Session Equipment Utilized During Treatment: Gait belt;Oxygen (4L) Nurse Communication: Mobility status;Precautions  Activity Tolerance: Patient tolerated treatment well Patient left: in chair;with call bell/phone within reach;with chair alarm set;with family/visitor present  OT Visit Diagnosis: Unsteadiness on feet (R26.81);Other abnormalities of gait and mobility (R26.89);Muscle weakness (generalized) (M62.81);History of falling (Z91.81);Other symptoms and signs involving the nervous system (R29.898);Other symptoms and signs involving cognitive function;Cognitive communication deficit (R41.841);Hemiplegia and hemiparesis Symptoms and signs involving cognitive functions: Other cerebrovascular disease Hemiplegia - Right/Left: Right Hemiplegia - dominant/non-dominant: Dominant Hemiplegia - caused by: Other cerebrovascular disease                Time: 6045-4098 OT Time Calculation (min): 33 min Charges:  OT General Charges $OT Visit: 1 Visit OT Evaluation $OT Eval Moderate Complexity: 1 Mod Nyoka Cowden OTR/L Acute Rehabilitation Services Office: [phone]  Emelda Fear 05/15/2023, 2:35 PM

## 2023-05-16 ENCOUNTER — Inpatient Hospital Stay (HOSPITAL_COMMUNITY)

## 2023-05-16 DIAGNOSIS — I63512 Cerebral infarction due to unspecified occlusion or stenosis of left middle cerebral artery: Secondary | ICD-10-CM

## 2023-05-16 DIAGNOSIS — I5033 Acute on chronic diastolic (congestive) heart failure: Secondary | ICD-10-CM | POA: Diagnosis not present

## 2023-05-16 DIAGNOSIS — I4891 Unspecified atrial fibrillation: Secondary | ICD-10-CM

## 2023-05-16 DIAGNOSIS — N170 Acute kidney failure with tubular necrosis: Secondary | ICD-10-CM | POA: Diagnosis not present

## 2023-05-16 DIAGNOSIS — I5032 Chronic diastolic (congestive) heart failure: Secondary | ICD-10-CM | POA: Diagnosis not present

## 2023-05-16 DIAGNOSIS — I484 Atypical atrial flutter: Secondary | ICD-10-CM | POA: Diagnosis not present

## 2023-05-16 DIAGNOSIS — R7989 Other specified abnormal findings of blood chemistry: Secondary | ICD-10-CM

## 2023-05-16 DIAGNOSIS — G9341 Metabolic encephalopathy: Secondary | ICD-10-CM | POA: Diagnosis not present

## 2023-05-16 DIAGNOSIS — I63412 Cerebral infarction due to embolism of left middle cerebral artery: Secondary | ICD-10-CM | POA: Diagnosis not present

## 2023-05-16 DIAGNOSIS — E440 Moderate protein-calorie malnutrition: Secondary | ICD-10-CM | POA: Insufficient documentation

## 2023-05-16 LAB — URINE CULTURE: Culture: 10000 — AB

## 2023-05-16 LAB — COMPREHENSIVE METABOLIC PANEL
ALT: 37 U/L (ref 0–44)
AST: 28 U/L (ref 15–41)
Albumin: 3.2 g/dL — ABNORMAL LOW (ref 3.5–5.0)
Alkaline Phosphatase: 73 U/L (ref 38–126)
Anion gap: 15 (ref 5–15)
BUN: 45 mg/dL — ABNORMAL HIGH (ref 8–23)
CO2: 14 mmol/L — ABNORMAL LOW (ref 22–32)
Calcium: 8.6 mg/dL — ABNORMAL LOW (ref 8.9–10.3)
Chloride: 114 mmol/L — ABNORMAL HIGH (ref 98–111)
Creatinine, Ser: 3.61 mg/dL — ABNORMAL HIGH (ref 0.44–1.00)
GFR, Estimated: 11 mL/min — ABNORMAL LOW (ref >60)
Glucose, Bld: 89 mg/dL (ref 70–99)
Potassium: 3.8 mmol/L (ref 3.5–5.1)
Sodium: 143 mmol/L (ref 135–145)
Total Bilirubin: 0.6 mg/dL (ref 0.0–1.2)
Total Protein: 5.4 g/dL — ABNORMAL LOW (ref 6.5–8.1)

## 2023-05-16 LAB — ECHOCARDIOGRAM COMPLETE
AR max vel: 0.5 cm2
AV Area VTI: 0.54 cm2
AV Area mean vel: 0.49 cm2
AV Mean grad: 22 mmHg
AV Peak grad: 39.2 mmHg
Ao pk vel: 3.13 m/s
Area-P 1/2: 4.12 cm2
Height: 63 in
P 1/2 time: 371 ms
S' Lateral: 2.4 cm
Weight: 2194.02 [oz_av]

## 2023-05-16 LAB — CBC WITH DIFFERENTIAL/PLATELET
Abs Immature Granulocytes: 0.06 10*3/uL (ref 0.00–0.07)
Basophils Absolute: 0 10*3/uL (ref 0.0–0.1)
Basophils Relative: 0 %
Eosinophils Absolute: 0 10*3/uL (ref 0.0–0.5)
Eosinophils Relative: 0 %
HCT: 33.2 % — ABNORMAL LOW (ref 36.0–46.0)
Hemoglobin: 10.3 g/dL — ABNORMAL LOW (ref 12.0–15.0)
Immature Granulocytes: 1 %
Lymphocytes Relative: 4 %
Lymphs Abs: 0.4 10*3/uL — ABNORMAL LOW (ref 0.7–4.0)
MCH: 31 pg (ref 26.0–34.0)
MCHC: 31 g/dL (ref 30.0–36.0)
MCV: 100 fL (ref 80.0–100.0)
Monocytes Absolute: 0.9 10*3/uL (ref 0.1–1.0)
Monocytes Relative: 11 %
Neutro Abs: 7.4 10*3/uL (ref 1.7–7.7)
Neutrophils Relative %: 84 %
Platelets: 138 10*3/uL — ABNORMAL LOW (ref 150–400)
RBC: 3.32 MIL/uL — ABNORMAL LOW (ref 3.87–5.11)
RDW: 18.4 % — ABNORMAL HIGH (ref 11.5–15.5)
WBC: 8.8 10*3/uL (ref 4.0–10.5)
nRBC: 0 % (ref 0.0–0.2)

## 2023-05-16 LAB — GLUCOSE, CAPILLARY
Glucose-Capillary: 100 mg/dL — ABNORMAL HIGH (ref 70–99)
Glucose-Capillary: 104 mg/dL — ABNORMAL HIGH (ref 70–99)
Glucose-Capillary: 132 mg/dL — ABNORMAL HIGH (ref 70–99)
Glucose-Capillary: 145 mg/dL — ABNORMAL HIGH (ref 70–99)
Glucose-Capillary: 94 mg/dL (ref 70–99)
Glucose-Capillary: 95 mg/dL (ref 70–99)

## 2023-05-16 LAB — HEPARIN LEVEL (UNFRACTIONATED): Heparin Unfractionated: 0.78 [IU]/mL — ABNORMAL HIGH (ref 0.30–0.70)

## 2023-05-16 LAB — BRAIN NATRIURETIC PEPTIDE: B Natriuretic Peptide: 1268.9 pg/mL — ABNORMAL HIGH (ref 0.0–100.0)

## 2023-05-16 LAB — PHOSPHORUS: Phosphorus: 6 mg/dL — ABNORMAL HIGH (ref 2.5–4.6)

## 2023-05-16 LAB — APTT: aPTT: 117 s — ABNORMAL HIGH (ref 24–36)

## 2023-05-16 LAB — MAGNESIUM: Magnesium: 2 mg/dL (ref 1.7–2.4)

## 2023-05-16 LAB — CK: Total CK: 327 U/L — ABNORMAL HIGH (ref 38–234)

## 2023-05-16 MED ORDER — OSMOLITE 1.5 CAL PO LIQD
1000.0000 mL | ORAL | Status: DC
  Administered 2023-05-16 – 2023-05-22 (×6): 1000 mL
  Filled 2023-05-16 (×2): qty 1000

## 2023-05-16 MED ORDER — FUROSEMIDE 10 MG/ML IJ SOLN
20.0000 mg | Freq: Once | INTRAMUSCULAR | Status: AC
  Administered 2023-05-16: 20 mg via INTRAVENOUS
  Filled 2023-05-16: qty 2

## 2023-05-16 MED ORDER — ASPIRIN 81 MG PO CHEW
81.0000 mg | CHEWABLE_TABLET | Freq: Every day | ORAL | Status: DC
  Administered 2023-05-16 – 2023-05-22 (×7): 81 mg
  Filled 2023-05-16 (×7): qty 1

## 2023-05-16 MED ORDER — IPRATROPIUM BROMIDE 0.02 % IN SOLN
0.5000 mg | Freq: Three times a day (TID) | RESPIRATORY_TRACT | Status: DC
  Administered 2023-05-17 – 2023-05-19 (×7): 0.5 mg via RESPIRATORY_TRACT
  Filled 2023-05-16 (×7): qty 2.5

## 2023-05-16 MED ORDER — SODIUM BICARBONATE 650 MG PO TABS
650.0000 mg | ORAL_TABLET | Freq: Two times a day (BID) | ORAL | Status: DC
  Administered 2023-05-16 – 2023-05-22 (×13): 650 mg
  Filled 2023-05-16 (×13): qty 1

## 2023-05-16 MED ORDER — FUROSEMIDE 10 MG/ML IJ SOLN
40.0000 mg | Freq: Once | INTRAMUSCULAR | Status: AC
  Administered 2023-05-16: 40 mg via INTRAVENOUS
  Filled 2023-05-16: qty 4

## 2023-05-16 MED ORDER — FUROSEMIDE 10 MG/ML IJ SOLN
120.0000 mg | Freq: Once | INTRAVENOUS | Status: AC
  Administered 2023-05-16: 120 mg via INTRAVENOUS
  Filled 2023-05-16: qty 10

## 2023-05-16 MED ORDER — THIAMINE MONONITRATE 100 MG PO TABS
100.0000 mg | ORAL_TABLET | Freq: Every day | ORAL | Status: AC
  Administered 2023-05-16 – 2023-05-22 (×7): 100 mg
  Filled 2023-05-16 (×7): qty 1

## 2023-05-16 MED ORDER — HEPARIN (PORCINE) 25000 UT/250ML-% IV SOLN
650.0000 [IU]/h | INTRAVENOUS | Status: DC
  Administered 2023-05-18: 600 [IU]/h via INTRAVENOUS
  Administered 2023-05-20: 650 [IU]/h via INTRAVENOUS
  Filled 2023-05-16 (×2): qty 250

## 2023-05-16 MED ORDER — DILTIAZEM HCL 30 MG PO TABS
60.0000 mg | ORAL_TABLET | Freq: Three times a day (TID) | ORAL | Status: DC
  Administered 2023-05-16 – 2023-05-22 (×18): 60 mg
  Filled 2023-05-16: qty 1
  Filled 2023-05-16: qty 2
  Filled 2023-05-16: qty 1
  Filled 2023-05-16: qty 2
  Filled 2023-05-16 (×3): qty 1
  Filled 2023-05-16: qty 2
  Filled 2023-05-16: qty 1
  Filled 2023-05-16: qty 2
  Filled 2023-05-16: qty 1
  Filled 2023-05-16: qty 2
  Filled 2023-05-16: qty 1
  Filled 2023-05-16 (×3): qty 2
  Filled 2023-05-16 (×2): qty 1

## 2023-05-16 MED ORDER — PROSOURCE TF20 ENFIT COMPATIBL EN LIQD
60.0000 mL | Freq: Every day | ENTERAL | Status: DC
  Administered 2023-05-16 – 2023-05-22 (×7): 60 mL
  Filled 2023-05-16 (×7): qty 60

## 2023-05-16 MED ORDER — LEVALBUTEROL HCL 0.63 MG/3ML IN NEBU
0.6300 mg | INHALATION_SOLUTION | Freq: Three times a day (TID) | RESPIRATORY_TRACT | Status: DC
  Administered 2023-05-17 – 2023-05-19 (×7): 0.63 mg via RESPIRATORY_TRACT
  Filled 2023-05-16 (×7): qty 3

## 2023-05-16 MED ORDER — HEPARIN (PORCINE) 25000 UT/250ML-% IV SOLN
850.0000 [IU]/h | INTRAVENOUS | Status: DC
  Administered 2023-05-16: 850 [IU]/h via INTRAVENOUS
  Filled 2023-05-16: qty 250

## 2023-05-16 MED ORDER — SODIUM BICARBONATE 650 MG PO TABS: 650.0000 mg | ORAL_TABLET | Freq: Two times a day (BID) | ORAL | Status: DC

## 2023-05-16 MED ORDER — PERFLUTREN LIPID MICROSPHERE: 1.0000 mL | INTRAVENOUS | Status: AC | PRN

## 2023-05-16 MED ORDER — METOPROLOL TARTRATE 50 MG PO TABS
50.0000 mg | ORAL_TABLET | Freq: Two times a day (BID) | ORAL | Status: DC
  Administered 2023-05-16 – 2023-05-22 (×13): 50 mg
  Filled 2023-05-16 (×13): qty 1

## 2023-05-16 NOTE — TOC Progression Note (Signed)
 Transition of Care [facility]) - Progression Note    Patient Details  Name: Ruth Roth MRN: [id number] Date of Birth: 1928-12-20  [facility] of [facility]) CM/SW Contact  Mearl Latin, LCSW Phone Number: 05/16/2023, 4:17 PM  Clinical Narrative:    CSW continuing to follow for needs. Patient with Cortrak.         Expected Discharge Plan and Services       Living arrangements for the past 2 months: Single Family Home                                       Social Determinants of Health (SDOH) Interventions SDOH Screenings   Food Insecurity: Patient Unable To Answer (05/15/2023)  Housing: Patient Unable To Answer (05/15/2023)  Transportation Needs: Patient Unable To Answer (05/15/2023)  Utilities: Patient Unable To Answer (05/15/2023)  Depression (PHQ2-9): Low Risk  (12/19/2022)  Financial Resource Strain: Low Risk  (06/17/2022)  Physical Activity: Insufficiently Active (06/17/2022)  Social Connections: Unknown (05/15/2023)  Stress: No Stress Concern Present (06/17/2022)  Tobacco Use: Low Risk  (05/13/2023)    Readmission Risk Interventions     No data to display

## 2023-05-16 NOTE — Progress Notes (Signed)
 2336: Patient continuing to have wheezing and restlessness. Patient urine output is low (see I and Os) but has been. Virgel Manifold, NP paged and notified. Orders to flush foley to see if obstruction is present. RN flushed foley and does not seem to be obstruction present.    0330: Patient continues to wheeze and seem uncomfortable. Fluids ended at midnight. Virgel Manifold, NP notified. Orders for stat chest xray and BNP lab. After Tamsen Snider, NP to assess if patient is overloaded and any further interventions are needed.

## 2023-05-16 NOTE — Progress Notes (Signed)
 Speech Language Pathology Treatment: Dysphagia;Cognitive-Linquistic  Patient Details Name: Ruth Roth MRN: [id number] DOB: 03-06-1929 Today's Date: 05/16/2023 Time: 4166-0630 SLP Time Calculation (min) (ACUTE ONLY): 14 min  Assessment / Plan / Recommendation Clinical Impression  Ruth Roth was slightly more interactive today. Cortrak placed this am.  She allowed brief oral care, letting me brush her teeth and suction.  She accepted several ice chips, which she chewed and swallowed, losing some of the bolus from right side of mouth. Teaspoons of thin liquid also spilled from right side.  Swallow was palpated after presumable delay.  Consistent coughing occurred after all swallows of water, concerning for aspiration.  Pt demonstrated improved ability to follow one-step commands (spontaneously followed on 4/5 opportunities).  She was vocalizing spontaneously today.  No effort to communicate verbally - no attempts at speech could be elicited.  D/W dtr in law and grandson at bedside that we will f/u early next week (likely Monday) to determine improvements in swallowing and continue aphasia tx.  Through the weekend, she should be offered occasional ice chips after oral care. SLP will follow.    HPI HPI: Ruth Roth is a 89 y.o. female who presented to the ED from home on 05/14/23 after being found down at home, awake but verbally unresponsive and with right sided weakness. Dx L CVA. S/P thrombectomy 3/5. Intubated 3/5-3/5. MRI: Many acute Infarcts throughout the left MCA territory.  Associated edema without substantial mass effect. No midline shift.  Remote left PCA territory infarct. PMHx of HFpEF, pacemaker, atrial fibrillation (on Xarelto), Autoimmune hepatitis, Glaucoma, Hypercholesterolemia, Hypertension, Hypothyroidism, Inflammatory arthritis, CAD, Mitral regurgitation, Neuropathy, Osteoarthritis and PUD      SLP Plan  Continue with current plan of care      Recommendations for follow up  therapy are one component of a multi-disciplinary discharge planning process, led by the attending physician.  Recommendations may be updated based on patient status, additional functional criteria and insurance authorization.    Recommendations  Diet recommendations: NPO                  Oral care prior to ice chip/H20   Frequent or constant Supervision/Assistance Aphasia (R47.01);Dysphagia, oropharyngeal phase (R13.12)     Continue with current plan of care    Emma-Lee Oddo L. Samson Frederic, MA CCC/SLP Clinical Specialist - Acute Care SLP Acute Rehabilitation Services Office number [phone]  Ruth Roth  05/16/2023, 10:24 AM

## 2023-05-16 NOTE — Progress Notes (Signed)
    Patient Name: Ruth Roth           DOB: 1929/01/05  MRN: [id number]      Admission Date: 05/13/2023  Attending Provider: Merlene Laughter, DO  Primary Diagnosis: Stroke (cerebrum) [facility])   Level of care: ICU    CROSS COVER NOTE   Date of Service   05/16/2023   Ruth Roth, 89 y.o. female, was admitted on 05/13/2023 for Stroke (cerebrum) [facility]).    HPI/Events of Note    RN concerned with patient's increased work of breathing and wheezing, restlessness.   Pt was extubated 3/5. No report of stridor or drooling.  RN reports no relief from neb treatments.  Remains on 4 L [place] with optimal saturation.  Tachypneic. Hx of diastolic heart failure with preserved EF (04/04/2022 LVEF 60-65%), mild/moderate mitral valve regurgitation. New echo from yesterday pending. BNP on admission 673, which seems to be close to her baseline. Per intake/output documentation, patient is +5.4 L.  Fluids discontinued at midnight.  Repeat BNP level.  Chest x-ray 3/6 impression-new diffuse groundglass opacities bilaterally with septal thickening and small left greater than right pleural effusion, most consistent with pleural edema/congestive heart failure. Will repeat image, dose with lasix.    Addendum: Some improvement reported. Urine output still low.  Holding of on further lasix for now. Labs pending- review renal function, BNP   Interventions/ Plan   Chest x-ray- final impression pending, still bilateral opacities, pleural effusion?  20 mg IV Lasix BNP --> 673 --> 1268        Anthoney Harada, DNP, [employer]

## 2023-05-16 NOTE — Progress Notes (Signed)
 PHARMACY - ANTICOAGULATION CONSULT NOTE  Pharmacy Consult for heparin Indication: atrial fibrillation  Allergies  Allergen Reactions   Statins Other (See Comments)    Liver damage    Hydroxychloroquine Hives   Haldol [Haloperidol Lactate] Rash   Librax [Chlordiazepoxide-Clidinium] Rash   Plavix [Clopidogrel Bisulfate] Rash   Ramipril Rash    Patient Measurements: Height: 5\' 3"  (160 cm) Weight: 62.2 kg (137 lb 2 oz) IBW/kg (Calculated) : 52.4 Heparin Dosing Weight: 63.5kg  Vital Signs: Temp: 98 F (36.7 C) (03/07 1200) Temp Source: Oral (03/07 1200) BP: 149/61 (03/07 1200) Pulse Rate: 60 (03/07 1200)  Labs: Recent Labs    05/13/23 1518 05/13/23 1526 05/13/23 1835 05/13/23 1857 05/14/23 0455 05/15/23 0538 05/15/23 1244 05/16/23 0501  HGB 15.6*   < >  --    < > 13.1 10.7*  --  10.3*  HCT 49.2*   < >  --    < > 41.4 34.2*  --  33.2*  PLT 206  --   --   --  170 155  --  138*  APTT  --   --   --   --  26  --   --   --   LABPROT 14.9  --   --   --  14.9  --   --   --   INR 1.2  --   --   --  1.2  --   --   --   CREATININE 1.24*   < >  --   --  1.19* 2.34*  --  3.61*  CKTOTAL 1,135*  --   --   --  472*  --  311* 327*  TROPONINIHS 125*  --  143*  --   --   --   --   --    < > = values in this interval not displayed.    Estimated Creatinine Clearance: 8.2 mL/min (A) (by C-G formula based on SCr of 3.61 mg/dL (H)).   Medical History: Past Medical History:  Diagnosis Date   (HFpEF) heart failure with preserved ejection fraction (HCC)    a. 07/2017 Echo: EF 50-55%, no rwma, mild AS/AI, mod MR, mod dil LA, mod TR, PASP ; b. 03/2021 Echo: EF 60-65%. No rwma. Nl RV fxn. Sev BAE. Mild MR. Mild AI/AS.   Atrial fibrillation (HCC)    Autoimmune hepatitis (HCC)    followed by Dr Marva Panda   CHF (congestive heart failure) [facility])    Fibrocystic breast disease    Glaucoma    Hypercholesterolemia    Hypertension    Hypothyroidism    Inflammatory arthritis    MI  (myocardial infarction) (HCC)    Mitral regurgitation    a. 07/2017 Echo: Mod MR; b. 08/2017 Cath: 4+/Severe MR; c. 08/2017 TEE: Mild MR; d. 03/2021 Echo: Mild MR.   Neuropathy    Non-obstructive Coronary artery disease    a. 05/2014 NSTEMI/Cath: LM nl, LAD mild dzs, LCX 60ost hazy (? culprit), RCA mild dzs. EF 60% by echo-->Med Rx; b. 08/2017 Cath: LM 30ost, LAD min irregs, LCX small, 40ost/p, RCA large, min irregs, RPDA/RPL1 nl, EF 50-55%. 4+MR.   Osteoarthritis    Permanent atrial fibrillation (HCC)    a. CHA2DS2VASc = 6-->Xarelto.   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 Underwent thrombectomy 3/5 achieving TICI3 revascularization. Stroke team cleared her to start anticoagulation. Pharmacy consulted to start heparin for afib.  3/5 aPTT 26, INR 1.2 WNL, this in combination with fill history creates the concern for Xarelto adherence Hgb 10.3  Plt 138, stable, decreased, no signs/symptoms of bleeding reported.  Goal of Therapy:  Heparin level 0.3-0.5 units/ml Monitor platelets by anticoagulation protocol: Yes   Plan:  No bolus per stroke protocol Start heparin infusion at 850 units/hr Check anti-Xa level in 8 hours and daily while on heparin Monitor CBC and signs/symptoms of bleeding Follow up switch to DOAC   Stephenie Acres, PharmD PGY1 Pharmacy Resident 05/16/2023 12:57 PM

## 2023-05-16 NOTE — Progress Notes (Addendum)
 PHARMACY - ANTICOAGULATION CONSULT NOTE  Pharmacy Consult for heparin Indication: atrial fibrillation  Allergies  Allergen Reactions   Statins Other (See Comments)    Liver damage    Hydroxychloroquine Hives   Haldol [Haloperidol Lactate] Rash   Librax [Chlordiazepoxide-Clidinium] Rash   Plavix [Clopidogrel Bisulfate] Rash   Ramipril Rash    Patient Measurements: Height: 5\' 3"  (160 cm) Weight: 62.2 kg (137 lb 2 oz) IBW/kg (Calculated) : 52.4 Heparin Dosing Weight: 63.5kg  Vital Signs: Temp: 98.8 F (37.1 C) (03/07 2000) Temp Source: Axillary (03/07 2000) BP: 161/64 (03/07 2149) Pulse Rate: 71 (03/07 2149)  Labs: Recent Labs    05/14/23 0455 05/15/23 0538 05/15/23 1244 05/16/23 0501 05/16/23 2126  HGB 13.1 10.7*  --  10.3*  --   HCT 41.4 34.2*  --  33.2*  --   PLT 170 155  --  138*  --   APTT 26  --   --   --   --   LABPROT 14.9  --   --   --   --   INR 1.2  --   --   --   --   HEPARINUNFRC  --   --   --   --  0.78*  CREATININE 1.19* 2.34*  --  3.61*  --   CKTOTAL 472*  --  311* 327*  --     Estimated Creatinine Clearance: 8.2 mL/min (A) (by C-G formula based on SCr of 3.61 mg/dL (H)).   Medical History: Past Medical History:  Diagnosis Date   (HFpEF) heart failure with preserved ejection fraction (HCC)    a. 07/2017 Echo: EF 50-55%, no rwma, mild AS/AI, mod MR, mod dil LA, mod TR, PASP ; b. 03/2021 Echo: EF 60-65%. No rwma. Nl RV fxn. Sev BAE. Mild MR. Mild AI/AS.   Atrial fibrillation (HCC)    Autoimmune hepatitis (HCC)    followed by Dr Marva Panda   CHF (congestive heart failure) [facility])    Fibrocystic breast disease    Glaucoma    Hypercholesterolemia    Hypertension    Hypothyroidism    Inflammatory arthritis    MI (myocardial infarction) (HCC)    Mitral regurgitation    a. 07/2017 Echo: Mod MR; b. 08/2017 Cath: 4+/Severe MR; c. 08/2017 TEE: Mild MR; d. 03/2021 Echo: Mild MR.   Neuropathy    Non-obstructive Coronary artery disease    a. 05/2014  NSTEMI/Cath: LM nl, LAD mild dzs, LCX 60ost hazy (? culprit), RCA mild dzs. EF 60% by echo-->Med Rx; b. 08/2017 Cath: LM 30ost, LAD min irregs, LCX small, 40ost/p, RCA large, min irregs, RPDA/RPL1 nl, EF 50-55%. 4+MR.   Osteoarthritis    Permanent atrial fibrillation (HCC)    a. CHA2DS2VASc = 6-->Xarelto.   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 Underwent thrombectomy 3/5 achieving TICI3 revascularization. Stroke team cleared her to start anticoagulation. Pharmacy consulted to start heparin for afib.  Heparin level 0.78 is supratherapeutic on 850 units/hr.  Heparin is running in L PIV and level was drawn from R hand. No issues with infusion or bleeding per RN.   Goal of Therapy:  Heparin level 0.3-0.5 units/ml Monitor platelets by anticoagulation protocol: Yes   Plan:   Hold heparin for 30 min  and decrease to 700 units/hr Monitor daily heparin level, CBC, signs/symptoms of bleeding  Follow up switch to DOAC  Alphia Moh, PharmD, BCPS, [facility] Clinical Pharmacist  Please check AMION for all [facility] Pharmacy phone numbers After 10:00 PM, call Main Pharmacy [phone]

## 2023-05-16 NOTE — Progress Notes (Signed)
   05/16/23 1900  Oxygen Therapy/Pulse Ox  SpO2 93 %   Pt refused tx after it was started

## 2023-05-16 NOTE — Progress Notes (Signed)
   Patient Name: Ruth Roth Date of Encounter: 05/16/2023 [facility] Cardiologist: Lorine Bears, MD   Interval Summary  .    Seems to be understanding some speech.  Remains aphasic.   Atrial fibrillation rate is well-controlled.  Hemodynamically well compensated from a clinical point of view, but she is developing edema.  Net +5 L since admission. Chest x-ray starting to show some vascular congestion and an increase in the size of bilateral pleural effusions. Severely oliguric.  Vital Signs .    Vitals:   05/16/23 0700 05/16/23 0800 05/16/23 0930 05/16/23 1000  BP: (!) 148/86 (!) 160/112 (!) 144/57 (!) 149/53  Pulse: 70 73 76 75  Resp: (!) 28 (!) 30 (!) 26 (!) 25  Temp:  98.4 F (36.9 C)    TempSrc:  Oral    SpO2: (!) 89% 98% 96% 93%  Weight:      Height:        Intake/Output Summary (Last 24 hours) at 05/16/2023 1101 Last data filed at 05/16/2023 1000 Gross per 24 hour  Intake 1353.85 ml  Output 260 ml  Net 1093.85 ml      05/14/2023    1:28 AM 05/14/2023    1:00 AM 05/13/2023    3:22 PM  Last 3 Weights  Weight (lbs) 137 lb 2 oz 133 lb 13.1 oz 140 lb  Weight (kg) 62.2 kg 60.7 kg 63.504 kg      Telemetry/ECG    Atrial flutter with mostly 4: 1 AV block, infrequent ventricular pacing- Personally Reviewed  Physical Exam .   GEN: No acute distress.  Remains aphasic, but seems to be making some progress with understanding speech Neck: No JVD Cardiac: RRR, no murmurs, rubs, or gallops.  Healthy pacemaker site Respiratory: Clear to auscultation bilaterally. GI: Soft, nontender, non-distended  MS: No edema  Assessment & Plan .     Atrial flutter well rate controlled.  Will switch to medications per feeding tube. Anticoagulation per stroke service (with worsening kidney function she now qualifies for Eliquis 2.5 mg twice daily dose).  It is probably best to leave on intravenous heparin for now. She is clearly hypervolemic, but does not have any respiratory  difficulty at this time. Unfortunately, she is developing oligoanuric acute renal failure, probably has acute tubular necrosis. She is at risk for developing decompensated heart failure due to volume overload and the only way to treat this may be dialysis.  Nephrology has been consulted. At age 46, need for HD (even temporary) would come with severely limited prognosis.  CRITICAL CARE Performed by: Rachelle Hora Freddye Cardamone   Total critical care time: 40 minutes  Critical care time was exclusive of separately billable procedures and treating other patients.  Critical care was necessary to treat or prevent imminent or life-threatening deterioration.  Critical care was time spent personally by me on the following activities: development of treatment plan with patient and/or surrogate as well as nursing, discussions with consultants, evaluation of patient's response to treatment, examination of patient, obtaining history from patient or surrogate, ordering and performing treatments and interventions, ordering and review of laboratory studies, ordering and review of radiographic studies, pulse oximetry and re-evaluation of patient's condition.   For questions or updates, please contact  [facility] Please consult [email or web address] for contact info under        Signed, Thurmon Fair, MD

## 2023-05-16 NOTE — Consult Note (Signed)
 Ruth Roth Admit Date: 05/13/2023 05/16/2023 Ruth Roth Requesting Physician:  Marland Mcalpine DO  Reason for Consult:  AKI on CKD3 HPI:  11F who at baseline is independent and doing well suffered an acute CVA and was brought to the ED on 05/13/2023 after being found down at home with MCA infarct likely thrombotic from atrial fibrillation.  Initial evaluation included contrasted imaging of the chest, abdomen, head.  She underwent mechanical thrombectomy also with contrast exposure.  She had transient VDRF followed by critical care and was successfully extubated.  Cardiology has been following for atrial fibs/flutter, known CAD, history of PPM.  Other PMH includes autoimmune hepatitis.  She remains aphasic.  Presenting creatinine 1.1-1.2 and it is worsened to 3.6.  Urine output has fallen with 0.2 L reported yesterday.  She is 5.4 L positive from presentation.  Blood pressures are fairly stable, she is on 4 L nasal cannula.  Portable chest x-ray today with mild interstitial edema and increasing pleural effusions.  CK with mildly elevated at presentation and has downtrended.  Peak value was 1135.  Renal imaging without significant acute or structural problems.  Urine analysis completed yesterday with 6-10 erythrocytes per high-powered field, pyuria present.  Urine sodium yesterday was 17.  She has received Lasix 60 mg so far today without much improvement in her urine output.  She has started on sodium bicarbonate.  IV fluids have been stopped.   Creatinine (mg/dL)  Date Value  16/12/9602 1.42 (H)   Creat (mg/dL)  Date Value  54/11/8117 1.05 (H)   Creatinine, Ser (mg/dL)  Date Value  14/78/2956 3.61 (H)  05/15/2023 2.34 (H)  05/14/2023 1.19 (H)  05/13/2023 1.10 (H)  05/13/2023 1.24 (H)  04/10/2023 1.13 (H)  02/12/2023 1.11  02/03/2023 1.34 (H)  12/04/2022 1.07 (H)  11/27/2022 1.31 (H)  ] I/Os: I/O last 3 completed shifts: In: 1918.7 [I.V.:1752; Other:20; IV Piggyback:146.7] Out: 290  [Urine:290]   ROS NSAIDS: No identified exposure IV Contrast significant exposure as above Balance of 12 systems is negative w/ exceptions as above  PMH  Past Medical History:  Diagnosis Date   (HFpEF) heart failure with preserved ejection fraction (HCC)    a. 07/2017 Echo: EF 50-55%, no rwma, mild AS/AI, mod MR, mod dil LA, mod TR, PASP ; b. 03/2021 Echo: EF 60-65%. No rwma. Nl RV fxn. Sev BAE. Mild MR. Mild AI/AS.   Atrial fibrillation (HCC)    Autoimmune hepatitis (HCC)    followed by Dr Marva Panda   CHF (congestive heart failure) [facility])    Fibrocystic breast disease    Glaucoma    Hypercholesterolemia    Hypertension    Hypothyroidism    Inflammatory arthritis    MI (myocardial infarction) (HCC)    Mitral regurgitation    a. 07/2017 Echo: Mod MR; b. 08/2017 Cath: 4+/Severe MR; c. 08/2017 TEE: Mild MR; d. 03/2021 Echo: Mild MR.   Neuropathy    Non-obstructive Coronary artery disease    a. 05/2014 NSTEMI/Cath: LM nl, LAD mild dzs, LCX 60ost hazy (? culprit), RCA mild dzs. EF 60% by echo-->Med Rx; b. 08/2017 Cath: LM 30ost, LAD min irregs, LCX small, 40ost/p, RCA large, min irregs, RPDA/RPL1 nl, EF 50-55%. 4+MR.   Osteoarthritis    Permanent atrial fibrillation (HCC)    a. CHA2DS2VASc = 6-->Xarelto.   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   PSH  Past Surgical History:  Procedure Laterality Date   ABDOMINAL HYSTERECTOMY  1963   partial, secondary to fibroids   APPENDECTOMY     Billroth     CARDIAC CATHETERIZATION  05/12/14   [facility]   CARDIOVERSION N/A 12/26/2016   Procedure: CARDIOVERSION;  Surgeon: Iran Ouch, MD;  Location: [facility] ORS;  Service: Cardiovascular;  Laterality: N/A;   CARDIOVERSION N/A 05/16/2017   Procedure: CARDIOVERSION;  Surgeon: Iran Ouch, MD;  Location: [facility] ORS;  Service: Cardiovascular;  Laterality: N/A;   CARDIOVERSION N/A 06/09/2017   Procedure: CARDIOVERSION;  Surgeon: Iran Ouch,  MD;  Location: [facility] ORS;  Service: Cardiovascular;  Laterality: N/A;   CARDIOVERSION N/A 11/30/2018   Procedure: CARDIOVERSION;  Surgeon: Iran Ouch, MD;  Location: [facility] ORS;  Service: Cardiovascular;  Laterality: N/A;   CHOLECYSTECTOMY  1998   COLONOSCOPY  2009   IR CT HEAD LTD  05/14/2023   IR PERCUTANEOUS ART THROMBECTOMY/INFUSION INTRACRANIAL INC DIAG ANGIO  05/14/2023   PACEMAKER IMPLANT N/A 09/28/2021   Procedure: PACEMAKER IMPLANT;  Surgeon: Lanier Prude, MD;  Location: [facility] INVASIVE CV LAB;  Service: Cardiovascular;  Laterality: N/A;   RADIOLOGY WITH ANESTHESIA N/A 05/13/2023   Procedure: RADIOLOGY WITH ANESTHESIA;  Surgeon: Radiologist, Medication, MD;  Location: [facility] OR;  Service: Radiology;  Laterality: N/A;   RIGHT/LEFT HEART CATH AND CORONARY ANGIOGRAPHY Bilateral 08/21/2017   Procedure: RIGHT/LEFT HEART CATH AND CORONARY ANGIOGRAPHY;  Surgeon: Iran Ouch, MD;  Location: [facility] INVASIVE CV LAB;  Service: Cardiovascular;  Laterality: Bilateral;   TEE WITHOUT CARDIOVERSION N/A 08/25/2017   Procedure: TRANSESOPHAGEAL ECHOCARDIOGRAM (TEE);  Surgeon: Dolores Patty, MD;  Location: [facility] ENDOSCOPY;  Service: Cardiovascular;  Laterality: N/A;   UPPER GI ENDOSCOPY  2004   FH  Family History  Problem Relation Age of Onset   Stroke Mother    Esophageal cancer Brother        also had lung cancer   Ovarian cancer Daughter    Colon cancer Daughter    Breast cancer Neg Hx    SH  reports that she has never smoked. She has never used smokeless tobacco. She reports that she does not drink alcohol and does not use drugs. Allergies  Allergies  Allergen Reactions   Statins Other (See Comments)    Liver damage    Hydroxychloroquine Hives   Haldol [Haloperidol Lactate] Rash   Librax [Chlordiazepoxide-Clidinium] Rash   Plavix [Clopidogrel Bisulfate] Rash   Ramipril Rash   Home medications Prior to Admission medications   Medication Sig Start Date End Date Taking? Authorizing  Provider  diltiazem (CARDIZEM CD) 240 MG 24 hr capsule Take 240 mg by mouth daily.    [provider]  dorzolamide (TRUSOPT) 2 % ophthalmic solution Place 1 drop into the left eye 2 (two) times daily.    [provider]  DULoxetine (CYMBALTA) 20 MG capsule Take 20 mg by mouth 2 (two) times daily.    [provider]  fluticasone (FLONASE) 50 MCG/ACT nasal spray Place 2 sprays into both nostrils daily as needed for allergies. 08/27/16   [provider]  fluticasone-salmeterol (ADVAIR HFA) 115-21 MCG/ACT inhaler Inhale 2 puffs into the lungs 2 (two) times daily. 01/02/23   Allegra Grana, FNP  gabapentin (NEURONTIN) 300 MG capsule One capsule tid and two capsules q hs. 01/28/20   Dale Salem, MD  ipratropium (ATROVENT) 0.03 % nasal spray Place 2 sprays into both nostrils 3 (three) times daily as needed for rhinitis. 04/19/20   [provider]  latanoprost (XALATAN) 0.005 % ophthalmic solution Place  1 drop into both eyes at bedtime.  07/29/18   [provider]  levalbuterol (XOPENEX HFA) 45 MCG/ACT inhaler Inhale 1 puff into the lungs every 6 (six) hours as needed for wheezing or shortness of breath. 12/16/22   Allegra Grana, FNP  levothyroxine (SYNTHROID) 137 MCG tablet TAKE 1 TABLET(137 MCG) BY MOUTH DAILY BEFORE BREAKFAST 03/11/23   Dale Halfway, MD  metoprolol tartrate (LOPRESSOR) 100 MG tablet TAKE 1 AND 1/2 TABLETS(150 MG) BY MOUTH TWICE DAILY 03/14/23   Iran Ouch, MD  Multiple Vitamin (MULTI-VITAMIN) tablet Take 1 tablet by mouth at bedtime.     [provider]  Probiotic Product (ALIGN) 4 MG CAPS Take one capsule q day 08/10/21   Dale Naalehu, MD  torsemide (DEMADEX) 20 MG tablet Take 1 tablet (20 mg total) by mouth daily. 11/18/22 03/26/23  Laurey Morale, MD  traMADol (ULTRAM) 50 MG tablet Take 1 tablet (50 mg total) by mouth 2 (two) times daily as needed. 03/17/23   Dale , MD  ursodiol (ACTIGALL) 300 MG  capsule Take 300-600 mg by mouth See admin instructions. Take 2 capsules (600 mg) daily in the morning   & 1 capsule (300 mg) at night.    [provider]  XARELTO 15 MG TABS tablet TAKE 1 TABLET(15 MG) BY MOUTH DAILY WITH SUPPER 02/04/23   Bensimhon, Bevelyn Buckles, MD    Current Medications Scheduled Meds:  arformoterol  15 mcg Nebulization BID   aspirin  81 mg Per Tube Daily   budesonide (PULMICORT) nebulizer solution  0.25 mg Nebulization BID   Chlorhexidine Gluconate Cloth  6 each Topical Daily   diltiazem  60 mg Per Tube Q8H   docusate  100 mg Per Tube BID   Gerhardt's butt cream   Topical TID   heparin injection (subcutaneous)  5,000 Units Subcutaneous Q8H   ipratropium  0.5 mg Nebulization Q6H   levalbuterol  0.63 mg Nebulization Q6H   metoprolol tartrate  50 mg Per Tube BID   mupirocin ointment  1 Application Nasal BID   polyethylene glycol  17 g Per Tube Daily   sodium bicarbonate  650 mg Per Tube BID   Continuous Infusions: PRN Meds:.acetaminophen **OR** acetaminophen (TYLENOL) oral liquid 160 mg/5 mL **OR** acetaminophen, bisacodyl, dextrose, iohexol, levalbuterol, mouth rinse, perflutren lipid microspheres (DEFINITY) IV suspension, senna-docusate  CBC Recent Labs  Lab 05/14/23 0455 05/15/23 0538 05/16/23 0501  WBC 10.6* 8.9 8.8  NEUTROABS 8.9*  --  7.4  HGB 13.1 10.7* 10.3*  HCT 41.4 34.2* 33.2*  MCV 97.6 99.7 100.0  PLT 170 155 138*   Basic Metabolic Panel Recent Labs  Lab 05/13/23 1518 05/13/23 1526 05/13/23 1857 05/14/23 0155 05/14/23 0455 05/15/23 0538 05/16/23 0501  NA 141 143 144 140 142 140 143  K 4.1 4.1 3.8 3.3* 3.7 4.1 3.8  CL 109 113*  --   --  108 112* 114*  CO2 17*  --   --   --  19* 15* 14*  GLUCOSE 138* 135*  --   --  145* 91 89  BUN 29* 31*  --   --  31* 35* 45*  CREATININE 1.24* 1.10*  --   --  1.19* 2.34* 3.61*  CALCIUM 9.5  --   --   --  9.0 8.6* 8.6*  PHOS  --   --   --   --   --   --  6.0*    Physical Exam  Blood pressure  Marland Kitchen)  156/56, pulse 73, temperature 98.4 F (36.9 C), temperature source Oral, resp. rate (!) 29, height 5\' 3"  (1.6 m), weight 62.2 kg, SpO2 95%. GEN: Awake, alert, tracking but not able to speak; aphasia ENT: NCAT with NG tube in place EYES: EOMI CV: Irregularly irregular, normal rate PULM: Clear bilaterally ABD: Soft, nontender SKIN: No rashes or lesions EXT: No significant edema GU: Foley catheter present  Assessment 67F with AKI on CKD3, oliguric after presenting with ischemic CVA undergoing mechanical thrombectomy  AKI on CKD 3 likely due to ATN from contrast exposure and acute illness; urine output following, now oliguric. Progressive hypervolemia at least 5 L positive from presentation developing some pulmonary edema Metabolic acidosis from #1 Acute ischemic left MCA CVA status post mechanical thrombectomy 3//25 Atrial fibs/flutter currently on heparin  Plan Discussed with family at bedside, will give a higher dose of Lasix and see if she makes any urine If kidney function continues to worsen especially with progressive hypervolemia or other electrolyte abnormalities then we will need to make a decision about dialysis which is very difficult in a woman of this age even if she was doing well. I think workup has been thorough here, imaging negative, UA consistent with Foley catheter Daily weights, Daily Renal Panel, Strict I/Os, Avoid nephrotoxins (NSAIDs, judicious IV Contrast)  All questions answered Discussed with cardiology.   Ruth Roth  05/16/2023, 11:47 AM

## 2023-05-16 NOTE — Progress Notes (Signed)
 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  She missed her doctors appointment on 3 4 and a wellness check was performed and they did break down her door to access her home.  She is found lying on the ground with multiple bruises and transferred to the ED as a level 2 trauma.  Upon arrival to the ED she was noted to have rapid atrial fibrillation as well as evidence of UTI with elevation in CK.  Trauma workup was unremarkable and cardiology is consulted for atrial fibrillation flutter.  She started on diltiazem infusion and was placed on IV amiodarone as well as beta-blocker.  She was placed on IV heparin and there is concern for stroke provoking her fall neurology was consulted.  CTA of the head and neck demonstrated occluded versus a severely stenotic distal left M1 MCA with 50 mL of left MCA penumbra.  Options were considered and she ultimately underwent interventional radiology for mechanical thrombectomy and ATICI 3 revascularization result was achieved.  Post procedurally she was transferred to the neuro ICU and PCCM was consulted given that she remained on the ventilator.    She was then subsequently extubated and sedation was weaned and transferred to the [facility] service on 05/15/2023.  She currently continues to have global aphasia and now has a worsening AKI so nephrology has been consulted and workup for AKI has been done. Cortrak is place now and Meds are being adjusted.   Assessment and Plan:  Acute left MCA scattered  infarcts which is likely embolic to an A-fib event even on anticoagulation status post revascularization 3 5 -She is status post IR left M1 occlusion with a tiki 3 and MRI showed multifocal acute infarcts throughout the left MCA territory.  MRI revealed distal M1 MCA stenosis versus occlusion which is now patent Echocardiogram has been done and neurology now recommending aspirin 300 mg daily suppository and Eliquis once p.o. access established.  Plan is for small bore core track in the a.m. further care per neurology and PT OT currently are recommending SNF.  Further stroke team recommendations appreciated and recommending systolic goal blood pressure being 03/31/1936 and maintain neuroprotective measures with goal for euthymia, euglycemia, natremia normoxia and appears CO2 of 35-40 -Neurology has now started her on IV heparin drip -Palliative care consulted for goals of care discussion  A flutter/atrial fibrillation with RVR/CAD/permanent pacemaker:  -IV therapies with IV diltiazem and IV metoprolol changed to orals and she is now on diltiazem 60 mg per tube Q8 as well as metoprolol to tartrate 50 mg per tube.  Will switch from Xarelto to Eliquis when she is able to be given enteral anticoagulant since and that she may have failed Xarelto but her worsening renal function this we will start her on IV heparin for now.  Continue to monitor on telemetry and further care per cardiology  Acute on Chronic diastolic heart failure with preserved ejection fraction -BNP went from 673.8 -> 1,268.9 -Continue to monitor for signs and symptoms 1 we will give now that we are rehydrating her given her AKI -Strict I's  and O's and daily weights is +5 L since admission.  Getting IV diuresis now and nephrology consulted for further assistance  AKI on CKD Stage 3b, worsening Metabolic acidosis -BUN/Cr Trend: Recent Labs  Lab 05/13/23 1518 05/13/23 1526 05/14/23 0455 05/15/23 0538 05/16/23 0501  BUN 29* 31* 31* 35*  45*  CREATININE 1.24* 1.10* 1.19* 2.34* 3.61*  -Check urinalysis, urine sodium, urine creatinine, urine osmolality and renal ultrasound as well as bladder scans; was given IV fluid hydration but this is stopped given her volume overload and now getting diuresis. -Patient also has a metabolic acidosis with a CO2 of 14, anion gap of 15, chloride level 114 -Avoid Nephrotoxic Medications, Contrast Dyes, Hypotension and Dehydration to Ensure Adequate Renal Perfusion and will need to Renally Adjust Meds -Continue to Monitor and Trend Renal Function carefully and repeat CMP in the AM  -Nephrology consulted for further evaluation given her a dose of IV Lasix 120 mg now to see if she would make any urine and if her kidney function continues to worsen especially with her progressive hypervolemia other electrolyte or maladies then they will need to make a decision about dialysis which is very difficult  Rhabdomyolysis -Improving.  CK has gone from 1135 -> 472 -> 311 and is now 327.  Discontinued IVF as above and Continue to Monitor and Trend and repeat CMP in the AM  Normocytic Anemia: Hemoglobin/hematocrit is now 10.3/33.2.  Check anemia panel in AM.  Continue to monitor for signs of bleeding; no overt bleeding noted but she was FOBT positive on admission.  Repeat CBC in a.m.  Thrombocytopenia: Platelet Count Dropped and is now 138. CTM for S/Sx of Bleeding and repeat CBC in the AM and C  Sepsis Secondary to GNR UTI -Continue with IV ceftriaxone given that she is nitrate positive.  Stop date is in place and today is her last day of antibiotics.  Leukocytosis has resolved and urinalysis is improving  Acute Respiratory Failure with Hypoxia -Was vented postoperatively and was weaned and now started on Xopenex and Atrovent. SpO2: 93 %: O2 Flow Rate (L/min): 5 L/min, FiO2 (%): 30 %.  Wearing supplemental oxygen via nasal cannula and had to be given IV Lasix given her volume overload -Continue breathing  treatments per eval, incentive spirometer and guaifenesin  Hypothyroidism -TSH is elevated at 8.301 but free T4 is within normal limits at 0.65; will resume home Synthroid once enteral access has been initiated  Hypoalbuminemia: Patient's Albumin Trend ranginf rom 3.2-3.8; Was 3.2 today. CTM and repeat CMP in the AM  DVT prophylaxis: Place and maintain sequential compression device Start: 05/14/23 0151 SCDs Start: 05/13/23 2145 Place TED hose Start: 05/13/23 2145    Code Status: Full Code Family Communication: Discussed with Grandson at bedside  Disposition Plan:  Level of care: ICU Status is: Inpatient Remains inpatient appropriate because: Needs further clinical improvement and clearance by Specialists    Consultants:  Nephrology Neurology Cardiology Palliative Care Medicine PCCM Transfer  Procedures:  As delineated as above  Antimicrobials:  Anti-infectives (From admission, onward)    Start     Dose/Rate Route Frequency Ordered Stop   05/14/23 0800  cefTRIAXone (ROCEPHIN) 1 g in sodium chloride 0.9 % 100 mL IVPB        1 g 200 mL/hr over 30 Minutes Intravenous Every 24 hours 05/13/23 2138 05/15/23 0826   05/13/23 2000  cefTRIAXone (ROCEPHIN) 1 g in sodium chloride 0.9 % 100 mL IVPB        1  g 200 mL/hr over 30 Minutes Intravenous  Once 05/13/23 1950 05/13/23 2055       Subjective: And examined at bedside is more interactive and does shake her head yes and no now.  Still has global aphasia and unable to communicate.  Respiratory status is improved from this morning.  Family at bedside thinks that she is doing better.  No other concerns or complaints at this time.  Objective: Vitals:   05/16/23 1400 05/16/23 1500 05/16/23 1600 05/16/23 1700  BP: 139/79 (!) 131/53 (!) 130/110 (!) 149/56  Pulse: 64 60 66 67  Resp: (!) 24 (!) 29 (!) 33 (!) 23  Temp:   98.6 F (37 C)   TempSrc:   Axillary   SpO2: (!) 88% 93% 91% 93%  Weight:      Height:        Intake/Output  Summary (Last 24 hours) at 05/16/2023 1924 Last data filed at 05/16/2023 1800 Gross per 24 hour  Intake 876.38 ml  Output 350 ml  Net 526.38 ml   Filed Weights   05/13/23 1522 05/14/23 0100 05/14/23 0128  Weight: 63.5 kg 60.7 kg 62.2 kg   Examination: Physical Exam:  Constitutional: Elderly Caucasian female sitting up in the bed who remains aphasic but is more interactive today Respiratory: Diminished to auscultation bilaterally with some coarse breath sounds and does have some crackles and some slight rhonchi but no appreciable wheezing or rales.  Wearing supplemental oxygen via nasal cannula Cardiovascular: Irregularly irregular and slightly tachycardic, no murmurs / rubs / gallops. S1 and S2 auscultated.  No appreciable extremity edema Abdomen: Soft, non-tender, non-distended. Bowel sounds positive.  GU: Deferred. Musculoskeletal: No clubbing / cyanosis of digits/nails. No joint deformity upper and lower extremities.  Skin: No rashes, lesions, ulcers on limited skin evaluation. No induration; Warm and dry.  Neurologic: Globally aphasic and has right facial droop with diminished breath but is more interactive Psychiatric: Appears calm  Data Reviewed: I have personally reviewed following labs and imaging studies  CBC: Recent Labs  Lab 05/13/23 1518 05/13/23 1526 05/13/23 1857 05/14/23 0155 05/14/23 0455 05/15/23 0538 05/16/23 0501  WBC 14.0*  --   --   --  10.6* 8.9 8.8  NEUTROABS  --   --   --   --  8.9*  --  7.4  HGB 15.6*   < > 15.3* 13.6 13.1 10.7* 10.3*  HCT 49.2*   < > 45.0 40.0 41.4 34.2* 33.2*  MCV 98.0  --   --   --  97.6 99.7 100.0  PLT 206  --   --   --  170 155 138*   < > = values in this interval not displayed.   Basic Metabolic Panel: Recent Labs  Lab 05/13/23 1518 05/13/23 1526 05/13/23 1857 05/14/23 0155 05/14/23 0455 05/15/23 0538 05/16/23 0501  NA 141 143 144 140 142 140 143  K 4.1 4.1 3.8 3.3* 3.7 4.1 3.8  CL 109 113*  --   --  108 112* 114*   CO2 17*  --   --   --  19* 15* 14*  GLUCOSE 138* 135*  --   --  145* 91 89  BUN 29* 31*  --   --  31* 35* 45*  CREATININE 1.24* 1.10*  --   --  1.19* 2.34* 3.61*  CALCIUM 9.5  --   --   --  9.0 8.6* 8.6*  MG 2.0  --   --   --   --   --  2.0  PHOS  --   --   --   --   --   --  6.0*   GFR: Estimated Creatinine Clearance: 8.2 mL/min (A) (by C-G formula based on SCr of 3.61 mg/dL (H)). Liver Function Tests: Recent Labs  Lab 05/13/23 1518 05/14/23 0455 05/15/23 0538 05/16/23 0501  AST 74* 43* 33 28  ALT 50* 41 37 37  ALKPHOS 98 79 70 73  BILITOT 1.3* 0.7 0.6 0.6  PROT 6.8 5.8* 5.7* 5.4*  ALBUMIN 3.8 3.2* 3.5 3.2*   No results for input(s): "LIPASE", "AMYLASE" in the last 168 hours. Recent Labs  Lab 05/13/23 1835  AMMONIA 17   Coagulation Profile: Recent Labs  Lab 05/13/23 1518 05/14/23 0455  INR 1.2 1.2   Cardiac Enzymes: Recent Labs  Lab 05/13/23 1518 05/14/23 0455 05/15/23 1244 05/16/23 0501  CKTOTAL 1,135* 472* 311* 327*   BNP (last 3 results) No results for input(s): "PROBNP" in the last 8760 hours. HbA1C: Recent Labs    05/14/23 0455  HGBA1C 5.4   CBG: Recent Labs  Lab 05/16/23 0008 05/16/23 0356 05/16/23 0730 05/16/23 1125 05/16/23 1526  GLUCAP 100* 95 94 104* 132*   Lipid Profile: Recent Labs    05/14/23 0455  CHOL 172  HDL 67  LDLCALC 67  TRIG 189*  CHOLHDL 2.6   Thyroid Function Tests: Recent Labs    05/13/23 2022  TSH 8.301*  FREET4 0.65   Anemia Panel: No results for input(s): "VITAMINB12", "FOLATE", "FERRITIN", "TIBC", "IRON", "RETICCTPCT" in the last 72 hours. Sepsis Labs: Recent Labs  Lab 05/13/23 1526 05/14/23 0455 05/15/23 1015 05/15/23 1244  LATICACIDVEN 3.2* 2.2* 1.4 1.6   Recent Results (from the past 240 hours)  MRSA Next Gen by PCR, Nasal     Status: Abnormal   Collection Time: 05/14/23 12:59 AM   Specimen: Nasal Mucosa; Nasal Swab  Result Value Ref Range Status   MRSA by PCR Next Gen DETECTED (A) NOT  DETECTED Final    Comment: RESULT CALLED TO, READ BACK BY AND VERIFIED WITH: K JONES,RN@[id number]  05/14/23 MK (NOTE) The GeneXpert MRSA Assay (FDA approved for NASAL specimens only), is one component of a comprehensive MRSA colonization surveillance program. It is not intended to diagnose MRSA infection nor to guide or monitor treatment for MRSA infections. Test performance is not FDA approved in patients less than 52 years old. Performed at [facility], [address], [place], [place] [address]   Culture, blood (Routine X 2) w Reflex to ID Panel     Status: None (Preliminary result)   Collection Time: 05/14/23  4:42 AM   Specimen: BLOOD  Result Value Ref Range Status   Specimen Description BLOOD SITE NOT SPECIFIED  Final   Special Requests   Final    AEROBIC BOTTLE ONLY Blood Culture results may not be optimal due to an inadequate volume of blood received in culture bottles   Culture   Final    NO GROWTH 2 DAYS Performed at [facility], [address] [place], [place] [address]    Report Status PENDING  Incomplete  Culture, blood (Routine X 2) w Reflex to ID Panel     Status: None (Preliminary result)   Collection Time: 05/14/23  4:55 AM   Specimen: BLOOD RIGHT HAND  Result Value Ref Range Status   Specimen Description BLOOD RIGHT HAND  Final   Special Requests   Final    BOTTLES DRAWN AEROBIC AND ANAEROBIC Blood Culture  adequate volume   Culture   Final    NO GROWTH 2 DAYS Performed at [facility], [address], [place], [place] [address]    Report Status PENDING  Incomplete  Urine Culture (for pregnant, neutropenic or urologic patients or patients with an indwelling urinary catheter)     Status: Abnormal   Collection Time: 05/14/23  6:22 AM   Specimen: Urine, Clean Catch  Result Value Ref Range Status   Specimen Description URINE, CLEAN CATCH  Final   Special Requests   Final    NONE Performed at [facility], [address] [place],  [place] [address]    Culture 10,000 COLONIES/mL ENTEROBACTER CLOACAE (A)  Final   Report Status 05/16/2023 FINAL  Final   Organism ID, Bacteria ENTEROBACTER CLOACAE (A)  Final      Susceptibility   Enterobacter cloacae - MIC*    CEFEPIME <=0.12 SENSITIVE Sensitive     CIPROFLOXACIN <=0.25 SENSITIVE Sensitive     GENTAMICIN <=1 SENSITIVE Sensitive     IMIPENEM 0.5 SENSITIVE Sensitive     NITROFURANTOIN 64 INTERMEDIATE Intermediate     TRIMETH/SULFA <=20 SENSITIVE Sensitive     PIP/TAZO <=4 SENSITIVE Sensitive ug/mL    * 10,000 COLONIES/mL ENTEROBACTER CLOACAE  Resp panel by RT-PCR (RSV, Flu A&B, Covid) Anterior Nasal Swab     Status: None   Collection Time: 05/14/23  6:22 AM   Specimen: Anterior Nasal Swab  Result Value Ref Range Status   SARS Coronavirus 2 by RT PCR NEGATIVE NEGATIVE Final   Influenza A by PCR NEGATIVE NEGATIVE Final   Influenza B by PCR NEGATIVE NEGATIVE Final    Comment: (NOTE) The Xpert Xpress SARS-CoV-2/FLU/RSV plus assay is intended as an aid in the diagnosis of influenza from Nasopharyngeal swab specimens and should not be used as a sole basis for treatment. Nasal washings and aspirates are unacceptable for Xpert Xpress SARS-CoV-2/FLU/RSV testing.  Fact Sheet for Patients: BloggerCourse.com  Fact Sheet for Healthcare Providers: SeriousBroker.it  This test is not yet approved or cleared by the [place] FDA and has been authorized for detection and/or diagnosis of SARS-CoV-2 by FDA under an Emergency Use Authorization (EUA). This EUA will remain in effect (meaning this test can be used) for the duration of the COVID-19 declaration under Section 564(b)(1) of the Act, 21 U.S.C. section 360bbb-3(b)(1), unless the authorization is terminated or revoked.     Resp Syncytial Virus by PCR NEGATIVE NEGATIVE Final    Comment: (NOTE) Fact Sheet for Patients: BloggerCourse.com  Fact Sheet  for Healthcare Providers: SeriousBroker.it  This test is not yet approved or cleared by the [place] FDA and has been authorized for detection and/or diagnosis of SARS-CoV-2 by FDA under an Emergency Use Authorization (EUA). This EUA will remain in effect (meaning this test can be used) for the duration of the COVID-19 declaration under Section 564(b)(1) of the Act, 21 U.S.C. section 360bbb-3(b)(1), unless the authorization is terminated or revoked.  Performed at [facility], [address] [place], [place] [address]     Radiology Studies: DG CHEST PORT 1 VIEW Result Date: 05/16/2023 CLINICAL DATA:  Shortness of breath.  History of stroke EXAM: PORTABLE CHEST 1 VIEW COMPARISON:  X-ray 05/16/2023 earlier FINDINGS: Enteric tube with tip extending beneath the diaphragm. Left upper chest pacemaker. Enlarged cardiopericardial silhouette with calcified aorta. Central vascular congestion. Persistent lung base opacities and effusions, left greater than right. No pneumothorax. Surgical changes along the upper central abdomen. IMPRESSION:  No significant oval change when adjusted for technique. Electronically Signed   By: Karen Kays M.D.   On: 05/16/2023 12:42   DG Abd Portable 1V Result Date: 05/16/2023 CLINICAL DATA:  Feeding tube placement EXAM: PORTABLE ABDOMEN - 1 VIEW COMPARISON:  05/14/2023 x-ray FINDINGS: Interval below prior NG tube. New feeding tube with tip crossing to the right of the spine, likely along the extreme distal stomach or very proximal duodenum. Minimal bowel gas in the upper abdomen. Surgical changes. Overlapping cardiac leads. Limited x-ray for tube placement. New left retrocardiac lung opacity. Small pleural effusions. Please see separate chest x-ray IMPRESSION: Limited x-ray for tube placement has feeding tube crossing to the right of the spine, likely distal stomach or very proximal duodenum Electronically Signed   By: Karen Kays M.D.    On: 05/16/2023 12:41   ECHOCARDIOGRAM COMPLETE Result Date: 05/16/2023    ECHOCARDIOGRAM REPORT   Patient Name:   Ruth Roth [facility] Date of Exam: 05/16/2023 Medical Rec #:  [id number]    Height:       63.0 in Accession #:    [id number]   Weight:       137.1 lb Date of Birth:  [birth year]    BSA:          1.647 m Patient Age:    89 years     BP:           149/53 mmHg Patient Gender: F            HR:           75 bpm. Exam Location:  Inpatient Procedure: 2D Echo, Cardiac Doppler and Color Doppler (Both Spectral and Color            Flow Doppler were utilized during procedure). Indications:    Atrial Fibrillation I48.91 Elevated Troponin  History:        Patient has prior history of Echocardiogram examinations, most                 recent 04/04/2022. CHF, Stroke; Aortic Valve Disease.  Sonographer:    Webb Laws Referring Phys: [id number] SUBRINA SUNDIL IMPRESSIONS  1. Left ventricular ejection fraction, by estimation, is 60 to 65%. The left ventricle has normal function. The left ventricle has no regional wall motion abnormalities. There is mild left ventricular hypertrophy. Left ventricular diastolic parameters are indeterminate.  2. Pacing leads seen in RA and RV. Right ventricular systolic function is normal. The right ventricular size is normal.  3. Left atrial size was severely dilated.  4. Right atrial size was severely dilated.  5. The mitral valve is degenerative. Trivial mitral valve regurgitation. No evidence of mitral stenosis. Severe mitral annular calcification.  6. The aortic valve was not well visualized. There is moderate calcification of the aortic valve. There is moderate thickening of the aortic valve. Aortic valve regurgitation is mild. Moderate aortic valve stenosis.  7. The inferior vena cava is normal in size with greater than 50% respiratory variability, suggesting right atrial pressure of 3 mmHg. FINDINGS  Left Ventricle: Left ventricular ejection fraction, by estimation, is 60 to 65%. The left  ventricle has normal function. The left ventricle has no regional wall motion abnormalities. Strain was performed and the global longitudinal strain is indeterminate. The left ventricular internal cavity size was normal in size. There is mild left ventricular hypertrophy. Left ventricular diastolic parameters are indeterminate. Right Ventricle: Pacing leads seen in RA and RV. The right ventricular size is normal. No increase  in right ventricular wall thickness. Right ventricular systolic function is normal. Left Atrium: Left atrial size was severely dilated. Right Atrium: Right atrial size was severely dilated. Pericardium: There is no evidence of pericardial effusion. Mitral Valve: The mitral valve is degenerative in appearance. There is moderate thickening of the mitral valve leaflet(s). There is moderate calcification of the mitral valve leaflet(s). Severe mitral annular calcification. Trivial mitral valve regurgitation. No evidence of mitral valve stenosis. Tricuspid Valve: The tricuspid valve is normal in structure. Tricuspid valve regurgitation is mild . No evidence of tricuspid stenosis. Aortic Valve: The aortic valve was not well visualized. There is moderate calcification of the aortic valve. There is moderate thickening of the aortic valve. Aortic valve regurgitation is mild. Aortic regurgitation PHT measures 371 msec. Moderate aortic  stenosis is present. Aortic valve mean gradient measures 22.0 mmHg. Aortic valve peak gradient measures 39.2 mmHg. Aortic valve area, by VTI measures 0.54 cm. Pulmonic Valve: The pulmonic valve was normal in structure. Pulmonic valve regurgitation is trivial. No evidence of pulmonic stenosis. Aorta: The aortic root is normal in size and structure. Venous: The inferior vena cava is normal in size with greater than 50% respiratory variability, suggesting right atrial pressure of 3 mmHg. IAS/Shunts: No atrial level shunt detected by color flow Doppler. Additional Comments: 3D  was performed not requiring image post processing on an independent workstation and was indeterminate. A device lead is visualized.  LEFT VENTRICLE PLAX 2D LVIDd:         3.40 cm   Diastology LVIDs:         2.40 cm   LV e' medial:    4.13 cm/s LV PW:         1.00 cm   LV E/e' medial:  53.0 LV IVS:        1.10 cm   LV e' lateral:   7.07 cm/s LVOT diam:     1.40 cm   LV E/e' lateral: 31.0 LV SV:         34 LV SV Index:   21 LVOT Area:     1.54 cm  RIGHT VENTRICLE            IVC RV Basal diam:  3.70 cm    IVC diam: 2.00 cm RV S prime:     7.40 cm/s TAPSE (M-mode): 2.0 cm LEFT ATRIUM             Index        RIGHT ATRIUM           Index LA diam:        4.60 cm 2.79 cm/m   RA Area:     13.30 cm LA Vol (A2C):   55.3 ml 33.57 ml/m  RA Volume:   29.60 ml  17.97 ml/m LA Vol (A4C):   46.6 ml 28.29 ml/m LA Biplane Vol: 51.6 ml 31.33 ml/m  AORTIC VALVE AV Area (Vmax):    0.50 cm AV Area (Vmean):   0.49 cm AV Area (VTI):     0.54 cm AV Vmax:           313.00 cm/s AV Vmean:          219.000 cm/s AV VTI:            0.627 m AV Peak Grad:      39.2 mmHg AV Mean Grad:      22.0 mmHg LVOT Vmax:         101.00 cm/s LVOT Vmean:  70.000 cm/s LVOT VTI:          0.220 m LVOT/AV VTI ratio: 0.35 AI PHT:            371 msec  AORTA Ao Root diam: 2.40 cm Ao Asc diam:  2.60 cm MITRAL VALVE MV Area (PHT): 4.12 cm     SHUNTS MV Decel Time: 184 msec     Systemic VTI:  0.22 m MV E velocity: 219.00 cm/s  Systemic Diam: 1.40 cm Charlton Haws MD Electronically signed by Charlton Haws MD Signature Date/Time: 05/16/2023/12:00:31 PM    Final    DG Chest Port 1 View Result Date: 05/16/2023 CLINICAL DATA:  [id number].  Shortness of breath. EXAM: PORTABLE CHEST 1 VIEW COMPARISON:  Portable chest yesterday at 10:44 a.m. FINDINGS: 3:40 a.m. Left chest dual lead pacing system and wire insertions are unchanged. Stable cardiomegaly. There is persistent perihilar vascular congestion and mild interstitial edema with small but increasing pleural  effusions. There is increased opacity in the lung bases which could be due to atelectasis or consolidation. The remaining lungs are clear. The mediastinum is stable. Aortic atherosclerosis. No new osseous findings.  Osteopenia.  Thoracic spondylosis. IMPRESSION: 1. Persistent perihilar vascular congestion and mild interstitial edema with small but increasing pleural effusions. 2. Increased opacity in the lung bases which could be due to atelectasis or consolidation. 3. Stable cardiomegaly. Electronically Signed   By: Almira Bar M.D.   On: 05/16/2023 06:43   US RENAL Result Date: 05/15/2023 CLINICAL DATA:  Acute renal in EXAM: RENAL / URINARY TRACT ULTRASOUND COMPLETE COMPARISON:  None Available. FINDINGS: Right Kidney: Renal measurements: 7.5 x 3.9 x 3.6 cm. = volume: 54.9 mL. Echogenicity within normal limits. No mass or hydronephrosis visualized. Large upper pole simple cyst is noted measuring 4.2 cm. Left Kidney: Renal measurements: 8.6 x 4.0 x 4.2 cm. = volume: 75.6 mL. Echogenicity within normal limits. No mass or hydronephrosis visualized. Bladder: Appears normal for degree of bladder distention. Other: None. IMPRESSION: Right simple renal cyst.  No follow-up is recommended. Electronically Signed   By: Alcide Clever M.D.   On: 05/15/2023 23:11   DG CHEST PORT 1 VIEW Result Date: 05/15/2023 CLINICAL DATA:  Wheezing. EXAM: PORTABLE CHEST 1 VIEW COMPARISON:  Radiographs 05/14/2023 and 05/13/2023.  CT 05/13/2023. FINDINGS: 1044 hours. Interval extubation and removal of the enteric tube. Left subclavian pacemaker leads appear unchanged, projecting over the right atrium and right ventricle. The heart size and mediastinal contours are stable. However, there are new diffuse ground-glass opacities bilaterally with septal thickening and small left greater than right pleural effusions. No evidence of pneumothorax. The bones appear unchanged. Asymmetric glenohumeral degenerative changes on the right. IMPRESSION:  New diffuse ground-glass opacities bilaterally with septal thickening and small left greater than right pleural effusions, most consistent with pulmonary edema/congestive heart failure. Electronically Signed   By: Carey Bullocks M.D.   On: 05/15/2023 14:44   Scheduled Meds:  arformoterol  15 mcg Nebulization BID   aspirin  81 mg Per Tube Daily   budesonide (PULMICORT) nebulizer solution  0.25 mg Nebulization BID   Chlorhexidine Gluconate Cloth  6 each Topical Daily   diltiazem  60 mg Per Tube Q8H   docusate  100 mg Per Tube BID   feeding supplement (PROSource TF20)  60 mL Per Tube Daily   Gerhardt's butt cream   Topical TID   ipratropium  0.5 mg Nebulization Q6H   levalbuterol  0.63 mg Nebulization Q6H   metoprolol tartrate  50  mg Per Tube BID   mupirocin ointment  1 Application Nasal BID   polyethylene glycol  17 g Per Tube Daily   sodium bicarbonate  650 mg Per Tube BID   thiamine  100 mg Per Tube Daily   Continuous Infusions:  feeding supplement (OSMOLITE 1.5 CAL) 40 mL/hr at 05/16/23 1700   heparin 850 Units/hr (05/16/23 1700)    LOS: 3 days   Marguerita Merles, DO [facility] Available via Epic secure chat 7am-7pm After these hours, please refer to coverage provider listed on [email or web address] 05/16/2023, 7:24 PM

## 2023-05-16 NOTE — Progress Notes (Signed)
 Initial Nutrition Assessment  DOCUMENTATION CODES:   Non-severe (moderate) malnutrition in context of chronic illness  INTERVENTION:   Initiate tube feeding via cortrak:  Osmolite 1.5 at 20 ml/h, advance by 10 ml every 6 hours to goal rate of 40 ml/hr  Prosource TF20 60 ml daily  Provides 1520 kcal, 80 gm protein, 960 ml free water daily   Thiamine 100 mg per tube daily x 7 days  Monitor K, Mg, P. Pt at refeeding risk    NUTRITION DIAGNOSIS:   Moderate Malnutrition related to chronic illness as evidenced by moderate fat depletion, moderate muscle depletion.  GOAL:   Patient will meet greater than or equal to 90% of their needs  MONITOR:   TF tolerance, Weight trends, Labs  REASON FOR ASSESSMENT:   Consult Assessment of nutrition requirement/status, Enteral/tube feeding initiation and management  ASSESSMENT:   Pt admitted for stroke. 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   3/5: extubated 3/7: cortrak placed   Pt's son and daughter in law at bedside upon visit to provide nutrition history. Pt's family reports pt eats well at home. They report she eats out for most of her meals at [employer] breakfast and restaurants like [employer] for dinner. They report her eating habits for the day depend on if she is meeting up with friends at restaurants or if she is at home. They report she is very independent and still drives herself and gets around fairly well. They report she has a walker at home but does not always use it. They report she does not tend to like any nutritional supplements like ensure or boost. They report pt does not take any vitamains or supplements at home anymore but does get iron transfusions every 3 months.  Family reports pt lost a lot of weight a few years back due to starting on a diuretic  pill. They report her weight has been stable for a few years. They believe her usual body weight to be around 130 lbs but are unsure.  Admit weight: 62.2 kg  Meds: colace BID, miralax daily, dulcolax, senokot PRN  Labs: CBGs range 94-100 in last 24 hours, BUN 45, Creatinine 3.61, phosphorus 6.0, K & Mg WDL, A1C 5.4  NUTRITION - FOCUSED PHYSICAL EXAM:  Flowsheet Row Most Recent Value  Orbital Region Moderate depletion  Upper Arm Region Severe depletion  Thoracic and Lumbar Region No depletion  Buccal Region Moderate depletion  Temple Region Moderate depletion  Clavicle Bone Region Mild depletion  Clavicle and Acromion Bone Region Unable to assess  [sores on shoulders]  Scapular Bone Region Moderate depletion  Dorsal Hand Unable to assess  [mitts]  Patellar Region Unable to assess  Anterior Thigh Region Unable to assess  [edema]  Posterior Calf Region Unable to assess  Edema (RD Assessment) Moderate  Hair Reviewed  Eyes Reviewed  Mouth Reviewed  Skin Reviewed  Nails Unable to assess  [mitts]       Diet Order:   Diet Order             Diet NPO time specified  Diet effective now                   EDUCATION NEEDS:   Not appropriate for education at this time  Skin:  Skin Assessment: Skin Integrity Issues: Skin Integrity Issues:: Stage II, Other (Comment) Stage II: bilateral  buttocks Other: iritant dermitis on groin & crease of thighs  Last BM:  no value recorded  Height:   Ht Readings from Last 1 Encounters:  05/13/23 5\' 3"  (1.6 m)    Weight:   Wt Readings from Last 1 Encounters:  05/14/23 62.2 kg     BMI:  Body mass index is 24.29 kg/m.  Estimated Nutritional Needs:   Kcal:  1400-1600  Protein:  75-90 g  Fluid:  >/= 1.4 L    Maceo Pro, MS Dietetic Intern

## 2023-05-16 NOTE — Procedures (Signed)
 Cortrak  Person Inserting Tube:  Shae Hinnenkamp T, RD Tube Type:  Cortrak - 43 inches Tube Size:  10 Tube Location:  Left nare Secured by: Bridle Technique Used to Measure Tube Placement:  Marking at nare/corner of mouth Cortrak Secured At:  66 cm   Cortrak Tube Team Note:  Consult received to place a Cortrak feeding tube.   Xray ordered by the Cortrak team. Please await read on xray prior to use.  If the tube becomes dislodged please keep the tube and contact the Cortrak team at [email or web address] for replacement.  If after hours and replacement cannot be delayed, place a NG tube and confirm placement with an abdominal x-ray.    Shelle Iron RD, LDN Contact via [employer].

## 2023-05-16 NOTE — Progress Notes (Signed)
 STROKE TEAM PROGRESS NOTE   SUBJECTIVE (INTERVAL HISTORY) Patient son and daughter are at bedside.  Patient is lying in bed for echo exam. She still has mild tachypnea and some wheezing, but awake alert, able to close eyes on request but not following other commands, nonverbal. AKI got worsen even with hydration.    OBJECTIVE Temp:  [98.1 F (36.7 C)-98.9 F (37.2 C)] 98.4 F (36.9 C) (03/07 0800) Pulse Rate:  [44-82] 73 (03/07 1100) Cardiac Rhythm: Atrial fibrillation;Atrial flutter (03/07 0730) Resp:  [18-39] 29 (03/07 1100) BP: (105-160)/(40-112) 156/56 (03/07 1100) SpO2:  [89 %-98 %] 95 % (03/07 1100)  Recent Labs  Lab 05/15/23 2010 05/16/23 0008 05/16/23 0356 05/16/23 0730 05/16/23 1125  GLUCAP 96 100* 95 94 104*   Recent Labs  Lab 05/13/23 1518 05/13/23 1526 05/13/23 1857 05/14/23 0155 05/14/23 0455 05/15/23 0538 05/16/23 0501  NA 141 143 144 140 142 140 143  K 4.1 4.1 3.8 3.3* 3.7 4.1 3.8  CL 109 113*  --   --  108 112* 114*  CO2 17*  --   --   --  19* 15* 14*  GLUCOSE 138* 135*  --   --  145* 91 89  BUN 29* 31*  --   --  31* 35* 45*  CREATININE 1.24* 1.10*  --   --  1.19* 2.34* 3.61*  CALCIUM 9.5  --   --   --  9.0 8.6* 8.6*  MG 2.0  --   --   --   --   --  2.0  PHOS  --   --   --   --   --   --  6.0*   Recent Labs  Lab 05/13/23 1518 05/14/23 0455 05/15/23 0538 05/16/23 0501  AST 74* 43* 33 28  ALT 50* 41 37 37  ALKPHOS 98 79 70 73  BILITOT 1.3* 0.7 0.6 0.6  PROT 6.8 5.8* 5.7* 5.4*  ALBUMIN 3.8 3.2* 3.5 3.2*   Recent Labs  Lab 05/13/23 1518 05/13/23 1526 05/13/23 1857 05/14/23 0155 05/14/23 0455 05/15/23 0538 05/16/23 0501  WBC 14.0*  --   --   --  10.6* 8.9 8.8  NEUTROABS  --   --   --   --  8.9*  --  7.4  HGB 15.6*   < > 15.3* 13.6 13.1 10.7* 10.3*  HCT 49.2*   < > 45.0 40.0 41.4 34.2* 33.2*  MCV 98.0  --   --   --  97.6 99.7 100.0  PLT 206  --   --   --  170 155 138*   < > = values in this interval not displayed.   Recent Labs   Lab 05/13/23 1518 05/14/23 0455 05/15/23 1244 05/16/23 0501  CKTOTAL 1,135* 472* 311* 327*   Recent Labs    05/13/23 1518 05/14/23 0455  LABPROT 14.9 14.9  INR 1.2 1.2   Recent Labs    05/13/23 1900 05/15/23 0933  COLORURINE YELLOW YELLOW  LABSPEC 1.030 >1.046*  PHURINE 5.0 5.0  GLUCOSEU NEGATIVE NEGATIVE  HGBUR MODERATE* MODERATE*  BILIRUBINUR NEGATIVE NEGATIVE  KETONESUR 20* NEGATIVE  PROTEINUR >=300* 100*  NITRITE POSITIVE* NEGATIVE  LEUKOCYTESUR LARGE* TRACE*       Component Value Date/Time   CHOL 172 05/14/2023 0455   TRIG 189 (H) 05/14/2023 0455   HDL 67 05/14/2023 0455   CHOLHDL 2.6 05/14/2023 0455   VLDL 38 05/14/2023 0455   LDLCALC 67 05/14/2023 0455   Lab Results  Component Value Date   HGBA1C 5.4 05/14/2023   No results found for: "LABOPIA", "COCAINSCRNUR", "LABBENZ", "AMPHETMU", "THCU", "LABBARB"  Recent Labs  Lab 05/13/23 1518  ETH <10    I have personally reviewed the radiological images below and agree with the radiology interpretations.  DG Chest Port 1 View Result Date: 05/16/2023 CLINICAL DATA:  [id number].  Shortness of breath. EXAM: PORTABLE CHEST 1 VIEW COMPARISON:  Portable chest yesterday at 10:44 a.m. FINDINGS: 3:40 a.m. Left chest dual lead pacing system and wire insertions are unchanged. Stable cardiomegaly. There is persistent perihilar vascular congestion and mild interstitial edema with small but increasing pleural effusions. There is increased opacity in the lung bases which could be due to atelectasis or consolidation. The remaining lungs are clear. The mediastinum is stable. Aortic atherosclerosis. No new osseous findings.  Osteopenia.  Thoracic spondylosis. IMPRESSION: 1. Persistent perihilar vascular congestion and mild interstitial edema with small but increasing pleural effusions. 2. Increased opacity in the lung bases which could be due to atelectasis or consolidation. 3. Stable cardiomegaly. Electronically Signed   By: Almira Bar M.D.   On: 05/16/2023 06:43   US RENAL Result Date: 05/15/2023 CLINICAL DATA:  Acute renal in EXAM: RENAL / URINARY TRACT ULTRASOUND COMPLETE COMPARISON:  None Available. FINDINGS: Right Kidney: Renal measurements: 7.5 x 3.9 x 3.6 cm. = volume: 54.9 mL. Echogenicity within normal limits. No mass or hydronephrosis visualized. Large upper pole simple cyst is noted measuring 4.2 cm. Left Kidney: Renal measurements: 8.6 x 4.0 x 4.2 cm. = volume: 75.6 mL. Echogenicity within normal limits. No mass or hydronephrosis visualized. Bladder: Appears normal for degree of bladder distention. Other: None. IMPRESSION: Right simple renal cyst.  No follow-up is recommended. Electronically Signed   By: Alcide Clever M.D.   On: 05/15/2023 23:11   DG CHEST PORT 1 VIEW Result Date: 05/15/2023 CLINICAL DATA:  Wheezing. EXAM: PORTABLE CHEST 1 VIEW COMPARISON:  Radiographs 05/14/2023 and 05/13/2023.  CT 05/13/2023. FINDINGS: 1044 hours. Interval extubation and removal of the enteric tube. Left subclavian pacemaker leads appear unchanged, projecting over the right atrium and right ventricle. The heart size and mediastinal contours are stable. However, there are new diffuse ground-glass opacities bilaterally with septal thickening and small left greater than right pleural effusions. No evidence of pneumothorax. The bones appear unchanged. Asymmetric glenohumeral degenerative changes on the right. IMPRESSION: New diffuse ground-glass opacities bilaterally with septal thickening and small left greater than right pleural effusions, most consistent with pulmonary edema/congestive heart failure. Electronically Signed   By: Carey Bullocks M.D.   On: 05/15/2023 14:44   IR CT Head Ltd Result Date: 05/15/2023 INDICATION: New onset right-sided hemiplegia and aphasia. 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 EXAM: 1. EMERGENT LARGE VESSEL OCCLUSION THROMBOLYSIS (anterior CIRCULATION) COMPARISON:  CT  angiogram of the head and neck of May 13, 2023. MEDICATIONS: No antibiotic was administered within 1 hour of the procedure. ANESTHESIA/SEDATION: General anesthesia. CONTRAST:  Omnipaque 300 approximately 120 mL. FLUOROSCOPY TIME:  Fluoroscopy Time: 30 minutes 0 seconds (7419 mGy). COMPLICATIONS: None immediate. TECHNIQUE: Following a full explanation of the procedure along with the potential associated complications, an informed witnessed consent was obtained. The risks of intracranial hemorrhage of 10%, worsening neurological deficit, ventilator dependency, death and inability to revascularize were all reviewed in detail with the patient's son. The patient was then put under general anesthesia by the Department of Anesthesiology at [facility]. The right groin was prepped and draped in  the usual sterile fashion. Thereafter using modified Seldinger technique, transfemoral access into the right common femoral artery was obtained without difficulty. Over an 0.035 inch guidewire an 8 French 25 cm Pinnacle sheath was inserted. Through this, and also over an 0.035 inch guidewire a combination of a support 088 100 cm Zoom catheter and a 125 cm Simmons 2 support catheter was advanced to the aortic arch region and selectively positioned in the left common carotid artery. Arteriograms were then performed centered extra cranially and intracranially. FINDINGS: Left common carotid arteriogram demonstrates the left external carotid artery and its major branches to be widely patent. The left internal carotid artery at the bulb to the cranial skull base is widely patent. The petrous, the cavernous and the supraclinoid segments demonstrate wide patency. The left anterior cerebral artery opacifies into the capillary and venous phases. The left middle cerebral artery demonstrates patency of the proximal 2/3 of the M1 segment. The distal 1/3 demonstrates occlusion. PROCEDURE: Through the Zoom 088 100 cm support catheter in  the cervical petrous junction, a combination of an 071 132 cm Zoom with an 021 150 cm Phenom microcatheter was advanced over an 018 inch standard micro guidewire with a moderate J configuration to the supraclinoid left ICA. The micro guidewire was then advanced to the distal left M1 segment followed by the microcatheter followed by the 071 Zoom aspiration catheter. The Zoom aspiration catheter was then engaged into the occluded distal M1 segment. Advancement of the microcatheter through the occluded distal M1 segment into the inferior over the superior divisions was met with significant resistance. The microcatheter and micro guidewire were removed. Constant aspiration was then applied at the hub of the Zoom aspiration catheter for approximately 2 minutes. Thereafter, with proximal aspiration, the combination of the Zoom aspiration catheter was retrieved and removed. A control arteriogram performed through the 088 Zoom support catheter demonstrated improved recanalization of the distal M1 with a few minor opacifying distal to this. A second pass was then made using an 062 134 cm Penumbra aspiration catheter with an 043 distal aspiration over an 018 inch micro guidewire. The microcatheter was again advanced to the mildly recanalized distal M1 segment followed by the microcatheter and the 062 Zoom aspiration catheter which was advanced into the occluded left M1 segment. Advancement of the micro guidewire through the firm clot was met with resistance. The microcatheter and micro guidewire were removed. Constant aspiration was then applied at the hub of the 062 Penumbra aspiration catheter for 2 minutes. A control arteriogram performed through the Zoom support catheter guidewire now demonstrated partial revascularization of the inferior division. A third pass was then made using the 062 Penumbra aspiration catheter with an 021 150 cm Phenom microcatheter which was advanced over an 018 inch micro guidewire with a  moderate J configuration to the distal M1 segment. The micro guidewire was then gently advanced without difficulty through the inferior division of the M2 M3 region followed by the microcatheter. The micro guidewire was removed. Good aspiration obtained from the hub of the microcatheter. A gentle control arteriogram performed through this demonstrates safe positioning of the tip of the microcatheter. A 4 mm x 40 mm Solitaire X retrieval device was then advanced to the distal end of the microcatheter and deployed in the usual manner such that the proximal portion covered the distal M1 segment. The 062 aspiration catheter was now engaged into the origin of the inferior division. A constant aspiration was applied at the hub of the 062 aspiration  catheter for approximately 2 minutes. Thereafter the combination of the retrieval device and the aspiration catheter was retrieved and removed. A few more chunks of clot were noted in the aspirate. A control arteriogram performed through the support catheter in the left internal carotid artery now demonstrated revascularization of the left MCA branches achieving a TICI 3 revascularization. Also noted at this time was a small filling defect just distal to the origin of the middle branch of the MCA bifurcation. This also demonstrated moderate spasm at its origin which responded to 4 aliquots of 25 mcg of nitroglycerin intra-arterially. The small filling defect in the proximal aspect of the superior division was then cleared with another pass with the 062 Penumbra aspiration catheter advanced in combination with a 150 cm Phenom micro catheter over an 018 inch micro guidewire. Aspiration was applied right at the face of the filling defect for 30 seconds. A control arteriogram performed following removal of the aspiration catheters demonstrated clearance of the filling defect with mild residual vasospasm which again responded to 25 mcg of nitroglycerin intra-arterially. A TICI 3  revascularization was achieved. Patency was maintained of the left anterior cerebral artery distribution. A final control arteriogram performed through the Zoom support catheter in the distal left common carotid artery demonstrated patency of the left internal carotid artery extra cranially and intracranially. The intracranial vasculature remained widely patent without any filling defects. An 8 French Angio-Seal closure device was deployed for hemostasis at the at the right groin puncture site. Distal pulses remained present in both feet unchanged compared to prior to the procedure. A flat panel CT of the brain demonstrated no evidence of intracranial hemorrhage. The patient was left intubated due to patient's initial neurologic condition. She was then transferred to the neuro ICU for post revascularization care. IMPRESSION: Status post endovascular complete revascularization of occluded distal left middle cerebral artery M1 segment, and the proximal portions of the superior and the inferior divisions with 3 passes of contact aspiration and 1 pass with a 4 mm x 40 mm Solitaire X retrieval device and contact aspiration achieving a TICI 3 revascularization. PLAN: Follow-up as per referring MD. Electronically Signed   By: Julieanne Cotton M.D.   On: 05/15/2023 08:27   IR PERCUTANEOUS ART THROMBECTOMY/INFUSION INTRACRANIAL INC DIAG ANGIO Result Date: 05/15/2023 INDICATION: New onset right-sided hemiplegia and aphasia. 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 EXAM: 1. EMERGENT LARGE VESSEL OCCLUSION THROMBOLYSIS (anterior CIRCULATION) COMPARISON:  CT angiogram of the head and neck of May 13, 2023. MEDICATIONS: No antibiotic was administered within 1 hour of the procedure. ANESTHESIA/SEDATION: General anesthesia. CONTRAST:  Omnipaque 300 approximately 120 mL. FLUOROSCOPY TIME:  Fluoroscopy Time: 30 minutes 0 seconds (7419 mGy). COMPLICATIONS: None immediate. TECHNIQUE: Following a  full explanation of the procedure along with the potential associated complications, an informed witnessed consent was obtained. The risks of intracranial hemorrhage of 10%, worsening neurological deficit, ventilator dependency, death and inability to revascularize were all reviewed in detail with the patient's son. The patient was then put under general anesthesia by the Department of Anesthesiology at [facility]. The right groin was prepped and draped in the usual sterile fashion. Thereafter using modified Seldinger technique, transfemoral access into the right common femoral artery was obtained without difficulty. Over an 0.035 inch guidewire an 8 French 25 cm Pinnacle sheath was inserted. Through this, and also over an 0.035 inch guidewire a combination of a support 088 100 cm Zoom catheter and a 125  cm Simmons 2 support catheter was advanced to the aortic arch region and selectively positioned in the left common carotid artery. Arteriograms were then performed centered extra cranially and intracranially. FINDINGS: Left common carotid arteriogram demonstrates the left external carotid artery and its major branches to be widely patent. The left internal carotid artery at the bulb to the cranial skull base is widely patent. The petrous, the cavernous and the supraclinoid segments demonstrate wide patency. The left anterior cerebral artery opacifies into the capillary and venous phases. The left middle cerebral artery demonstrates patency of the proximal 2/3 of the M1 segment. The distal 1/3 demonstrates occlusion. PROCEDURE: Through the Zoom 088 100 cm support catheter in the cervical petrous junction, a combination of an 071 132 cm Zoom with an 021 150 cm Phenom microcatheter was advanced over an 018 inch standard micro guidewire with a moderate J configuration to the supraclinoid left ICA. The micro guidewire was then advanced to the distal left M1 segment followed by the microcatheter followed by the  071 Zoom aspiration catheter. The Zoom aspiration catheter was then engaged into the occluded distal M1 segment. Advancement of the microcatheter through the occluded distal M1 segment into the inferior over the superior divisions was met with significant resistance. The microcatheter and micro guidewire were removed. Constant aspiration was then applied at the hub of the Zoom aspiration catheter for approximately 2 minutes. Thereafter, with proximal aspiration, the combination of the Zoom aspiration catheter was retrieved and removed. A control arteriogram performed through the 088 Zoom support catheter demonstrated improved recanalization of the distal M1 with a few minor opacifying distal to this. A second pass was then made using an 062 134 cm Penumbra aspiration catheter with an 043 distal aspiration over an 018 inch micro guidewire. The microcatheter was again advanced to the mildly recanalized distal M1 segment followed by the microcatheter and the 062 Zoom aspiration catheter which was advanced into the occluded left M1 segment. Advancement of the micro guidewire through the firm clot was met with resistance. The microcatheter and micro guidewire were removed. Constant aspiration was then applied at the hub of the 062 Penumbra aspiration catheter for 2 minutes. A control arteriogram performed through the Zoom support catheter guidewire now demonstrated partial revascularization of the inferior division. A third pass was then made using the 062 Penumbra aspiration catheter with an 021 150 cm Phenom microcatheter which was advanced over an 018 inch micro guidewire with a moderate J configuration to the distal M1 segment. The micro guidewire was then gently advanced without difficulty through the inferior division of the M2 M3 region followed by the microcatheter. The micro guidewire was removed. Good aspiration obtained from the hub of the microcatheter. A gentle control arteriogram performed through this  demonstrates safe positioning of the tip of the microcatheter. A 4 mm x 40 mm Solitaire X retrieval device was then advanced to the distal end of the microcatheter and deployed in the usual manner such that the proximal portion covered the distal M1 segment. The 062 aspiration catheter was now engaged into the origin of the inferior division. A constant aspiration was applied at the hub of the 062 aspiration catheter for approximately 2 minutes. Thereafter the combination of the retrieval device and the aspiration catheter was retrieved and removed. A few more chunks of clot were noted in the aspirate. A control arteriogram performed through the support catheter in the left internal carotid artery now demonstrated revascularization of the left MCA branches achieving a TICI 3  revascularization. Also noted at this time was a small filling defect just distal to the origin of the middle branch of the MCA bifurcation. This also demonstrated moderate spasm at its origin which responded to 4 aliquots of 25 mcg of nitroglycerin intra-arterially. The small filling defect in the proximal aspect of the superior division was then cleared with another pass with the 062 Penumbra aspiration catheter advanced in combination with a 150 cm Phenom micro catheter over an 018 inch micro guidewire. Aspiration was applied right at the face of the filling defect for 30 seconds. A control arteriogram performed following removal of the aspiration catheters demonstrated clearance of the filling defect with mild residual vasospasm which again responded to 25 mcg of nitroglycerin intra-arterially. A TICI 3 revascularization was achieved. Patency was maintained of the left anterior cerebral artery distribution. A final control arteriogram performed through the Zoom support catheter in the distal left common carotid artery demonstrated patency of the left internal carotid artery extra cranially and intracranially. The intracranial vasculature  remained widely patent without any filling defects. An 8 French Angio-Seal closure device was deployed for hemostasis at the at the right groin puncture site. Distal pulses remained present in both feet unchanged compared to prior to the procedure. A flat panel CT of the brain demonstrated no evidence of intracranial hemorrhage. The patient was left intubated due to patient's initial neurologic condition. She was then transferred to the neuro ICU for post revascularization care. IMPRESSION: Status post endovascular complete revascularization of occluded distal left middle cerebral artery M1 segment, and the proximal portions of the superior and the inferior divisions with 3 passes of contact aspiration and 1 pass with a 4 mm x 40 mm Solitaire X retrieval device and contact aspiration achieving a TICI 3 revascularization. PLAN: Follow-up as per referring MD. Electronically Signed   By: Julieanne Cotton M.D.   On: 05/15/2023 08:27   MR BRAIN WO CONTRAST Result Date: 05/14/2023 CLINICAL DATA:  Stroke, follow up EXAM: MRI HEAD WITHOUT CONTRAST MRA HEAD WITHOUT CONTRAST TECHNIQUE: Multiplanar, multi-echo pulse sequences of the brain and surrounding structures were acquired without intravenous contrast. Angiographic images of the Circle of Willis were acquired using MRA technique without intravenous contrast. COMPARISON:  CTA head/neck May 13, 2023. FINDINGS: MRI HEAD FINDINGS Brain: Many acute Infarcts throughout the left MCA territory. Associated edema without substantial mass effect. No midline shift. Remote left PCA territory infarct. Patchy T2/FLAIR hyperintensities the white matter, compatible with chronic microvascular ischemic disease. Remote left cerebellar infarct. No mass lesion. Cerebral atrophy. Vascular: See below. Skull and upper cervical spine: Normal marrow signal. Sinuses/Orbits: Mild paranasal sinus mucosal thickening. No acute orbital findings. Other: No mastoid effusions. MRA HEAD FINDINGS  Anterior circulation: Bilateral intracranial ICAs, MCAs, and ACAs are patent without proximal hemodynamically significant stenosis. Previously seen distal left M1 MCA stenosis versus occlusion now appears patent. Posterior circulation: Bilateral intradural vertebral arteries, basilar artery and bilateral posterior cerebral arteries are patent without proximal hemodynamically significant stenosis. IMPRESSION: 1. Multiple acute infarcts throughout the left MCA territory. 2. Previously seen distal left M1 MCA stenosis versus occlusion now appears patent. No large vessel occlusion or proximal hemodynamically significant stenosis. Electronically Signed   By: Feliberto Harts M.D.   On: 05/14/2023 19:31   MR ANGIO HEAD WO CONTRAST Result Date: 05/14/2023 CLINICAL DATA:  Stroke, follow up EXAM: MRI HEAD WITHOUT CONTRAST MRA HEAD WITHOUT CONTRAST TECHNIQUE: Multiplanar, multi-echo pulse sequences of the brain and surrounding structures were acquired without intravenous contrast. Angiographic images of  the Circle of Willis were acquired using MRA technique without intravenous contrast. COMPARISON:  CTA head/neck May 13, 2023. FINDINGS: MRI HEAD FINDINGS Brain: Many acute Infarcts throughout the left MCA territory. Associated edema without substantial mass effect. No midline shift. Remote left PCA territory infarct. Patchy T2/FLAIR hyperintensities the white matter, compatible with chronic microvascular ischemic disease. Remote left cerebellar infarct. No mass lesion. Cerebral atrophy. Vascular: See below. Skull and upper cervical spine: Normal marrow signal. Sinuses/Orbits: Mild paranasal sinus mucosal thickening. No acute orbital findings. Other: No mastoid effusions. MRA HEAD FINDINGS Anterior circulation: Bilateral intracranial ICAs, MCAs, and ACAs are patent without proximal hemodynamically significant stenosis. Previously seen distal left M1 MCA stenosis versus occlusion now appears patent. Posterior circulation:  Bilateral intradural vertebral arteries, basilar artery and bilateral posterior cerebral arteries are patent without proximal hemodynamically significant stenosis. IMPRESSION: 1. Multiple acute infarcts throughout the left MCA territory. 2. Previously seen distal left M1 MCA stenosis versus occlusion now appears patent. No large vessel occlusion or proximal hemodynamically significant stenosis. Electronically Signed   By: Feliberto Harts M.D.   On: 05/14/2023 19:31   DG Abd Portable 1V Result Date: 05/14/2023 CLINICAL DATA:  Check gastric catheter placement EXAM: PORTABLE ABDOMEN - 1 VIEW COMPARISON:  None Available. FINDINGS: Gastric catheter is noted within the stomach. Contrast is noted within the kidneys bilaterally. No free air is seen. IMPRESSION: Gastric catheter in the stomach. Electronically Signed   By: Alcide Clever M.D.   On: 05/14/2023 02:54   Portable Chest x-ray Result Date: 05/14/2023 CLINICAL DATA:  Status post intubation EXAM: PORTABLE CHEST 1 VIEW COMPARISON:  Film from the previous day. FINDINGS: Cardiac shadow is within normal limits. Pacing device is again seen. Endotracheal tube and gastric catheter are noted in satisfactory position. Lungs are free of acute infiltrate or effusion. IMPRESSION: No active disease. Electronically Signed   By: Alcide Clever M.D.   On: 05/14/2023 02:53   CT ANGIO HEAD NECK W WO CM W PERF Result Date: 05/13/2023 CLINICAL DATA:  Neuro deficit, acute, stroke suspected EXAM: CT ANGIOGRAPHY HEAD AND NECK CT PERFUSION BRAIN TECHNIQUE: Multidetector CT imaging of the head and neck was performed using the standard protocol during bolus administration of intravenous contrast. Multiplanar CT image reconstructions and MIPs were obtained to evaluate the vascular anatomy. Carotid stenosis measurements (when applicable) are obtained utilizing NASCET criteria, using the distal internal carotid diameter as the denominator. Multiphase CT imaging of the brain was performed  following IV bolus contrast injection. Subsequent parametric perfusion maps were calculated using RAPID software. RADIATION DOSE REDUCTION: This exam was performed according to the departmental dose-optimization program which includes automated exposure control, adjustment of the mA and/or kV according to patient size and/or use of iterative reconstruction technique. CONTRAST:  OMNIPAQUE IOHEXOL 350 MG/ML SOLN COMPARISON:  Same day CT head. FINDINGS: CTA NECK FINDINGS Aortic arch: Atherosclerosis.  Great vessel origins are patent. Right carotid system: Atherosclerosis at the carotid bifurcation without greater than 50% stenosis. Also, atherosclerosis at the skull base with approximately 60% stenosis. Left carotid system: Atherosclerosis without greater than 50% stenosis. Vertebral arteries: Left dominant. No significant (greater than 50%) stenosis. Skeleton: No acute abnormality on limited assessment. Other neck: No acute abnormality on limited assessment. Upper chest: Visualized lung apices are clear. Review of the MIP images confirms the above findings CTA HEAD FINDINGS Anterior circulation: Bilateral intracranial ICAs are patent without significant stenosis. Right MCA and bilateral ACAs are patent without proximal hemodynamically significant stenosis. Distal left M1 MCA occlusion  versus severe stenosis. Asymmetric opacification of the left MCA vessels more distally. Posterior circulation: Bilateral intradural vertebral arteries, basilar artery and bilateral posterior cerebral arteries are patent without proximal hemodynamically significant stenosis. Venous sinuses: As permitted by contrast timing, patent. Review of the MIP images confirms the above findings CT Brain Perfusion Findings: CBF (<30%) Volume: 0mL Perfusion (Tmax>6.0s) volume: 50mL Mismatch Volume: 50mL Infarction Location:None identified. IMPRESSION: 1. Occluded versus severely stenotic distal left M1 MCA with asymmetrically diminished distal  left MCA opacification. Recommend code stroke activation and emergent neurology consultation. 2. Approximately 50 mL of left MCA penumbra without evidence of core infarct by CT perfusion. 3. Approximately 60% stenosis of the right ICA at the skull base. Findings and recommendations discussed with Dr. Wilkie Aye via telephone at Dr. Wilkie Aye. Electronically Signed   By: Feliberto Harts M.D.   On: 05/13/2023 21:40   DG Shoulder Right Portable Result Date: 05/13/2023 CLINICAL DATA:  Status post trauma. EXAM: RIGHT SHOULDER - 1 VIEW COMPARISON:  None Available. FINDINGS: Limited study secondary to numerous overlying radiopaque cardiac lead wires. There is no evidence of acute fracture or dislocation. Chronic changes are seen along the greater tubercle and inferior medial aspect of the right humeral. There are moderate severity degenerative changes. Soft tissues are unremarkable. IMPRESSION: 1. Limited study without evidence of acute fracture or dislocation. 2. Moderate severity chronic and degenerative changes. Electronically Signed   By: Aram Candela M.D.   On: 05/13/2023 17:46   DG Pelvis Portable Result Date: 05/13/2023 CLINICAL DATA:  Status post trauma. EXAM: PORTABLE PELVIS 1-2 VIEWS COMPARISON:  March 05, 2016 FINDINGS: There is no evidence of pelvic fracture or diastasis. No pelvic bone lesions are seen. IMPRESSION: Negative. Electronically Signed   By: Aram Candela M.D.   On: 05/13/2023 17:45   DG Chest Port 1 View Result Date: 05/13/2023 CLINICAL DATA:  Status post trauma. EXAM: PORTABLE CHEST 1 VIEW COMPARISON:  December 17, 2022 FINDINGS: There is stable dual lead AICD positioning. The heart size and mediastinal contours are within normal limits. Marked severity calcification of the thoracic aorta is noted. There is no evidence of an acute infiltrate, pleural effusion or pneumothorax. Radiopaque surgical clips are seen overlying the esophageal hiatus. There is mild dextroscoliosis of the mid and  lower thoracic spine with multilevel degenerative changes. IMPRESSION: No active cardiopulmonary disease. Electronically Signed   By: Aram Candela M.D.   On: 05/13/2023 17:44   CT CHEST ABDOMEN PELVIS W CONTRAST Result Date: 05/13/2023 CLINICAL DATA:  Found unresponsive, unknown down time EXAM: CT CHEST, ABDOMEN, AND PELVIS WITH CONTRAST CT THORACIC AND LUMBAR SPINE WITH CONTRAST TECHNIQUE: Multidetector CT imaging of the chest, abdomen and pelvis was performed following the standard protocol during bolus administration of intravenous contrast. Multidetector CT imaging of the thoracic and lumbar spine was performed following the standard protocol during bolus administration of intravenous contrast. RADIATION DOSE REDUCTION: This exam was performed according to the departmental dose-optimization program which includes automated exposure control, adjustment of the mA and/or kV according to patient size and/or use of iterative reconstruction technique. CONTRAST:  75mL OMNIPAQUE IOHEXOL 350 MG/ML SOLN COMPARISON:  CT abdomen pelvis, 04/22/2014 FINDINGS: CT CHEST FINDINGS Cardiovascular: Left chest multi lead pacer. Aortic atherosclerosis. Normal heart size. Scattered left and right coronary artery calcifications. No pericardial effusion. Mediastinum/Nodes: No enlarged mediastinal, hilar, or axillary lymph nodes. Thyroid gland, trachea, and esophagus demonstrate no significant findings. Lungs/Pleura: Lungs are clear. No pleural effusion or pneumothorax. Musculoskeletal: No chest wall mass or suspicious osseous  lesions identified. CT ABDOMEN PELVIS FINDINGS Hepatobiliary: No focal liver abnormality is seen. Status post cholecystectomy. Similar severe postoperative biliary ductal dilatation. Pancreas: Unremarkable. No pancreatic ductal dilatation or surrounding inflammatory changes. Spleen: Normal in size without significant abnormality. Adrenals/Urinary Tract: Adrenal glands are unremarkable. Kidneys are normal,  without renal calculi, solid lesion, or hydronephrosis. Bladder is unremarkable. Stomach/Bowel: Stomach is within normal limits. Appendix not clearly visualized. No evidence of bowel wall thickening, distention, or inflammatory changes. Vascular/Lymphatic: Aortic atherosclerosis. 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 No enlarged abdominal or pelvic lymph nodes. Reproductive: No mass or other abnormality. Other: No abdominal wall hernia or abnormality. No ascites. Musculoskeletal: No acute osseous findings. CT THORACIC AND LUMBAR SPINE FINDINGS Alignment: Normal thoracic kyphosis. Normal lumbar lordosis. Vertebral bodies: No acute fracture or dislocation. Unchanged mild superior endplate wedge deformity of L2, with approximately 20% height loss. Disc spaces: Generally mild multilevel disc space height loss and osteophytosis throughout the thoracic and lumbar spine, focally moderate at L3-L4. Paraspinous soft tissues: Unremarkable. IMPRESSION: 1. No CT evidence of acute traumatic injury to the chest, abdomen, or pelvis. 2. No acute fracture or dislocation of the thoracic or lumbar spine. Unchanged mild, chronic wedge deformity of L2. 3. Status post cholecystectomy. Similar severe postoperative biliary ductal dilatation. 4. Coronary artery disease. Aortic Atherosclerosis (ICD10-I70.0). Electronically Signed   By: Jearld Lesch M.D.   On: 05/13/2023 17:28   CT L-SPINE NO CHARGE Result Date: 05/13/2023 CLINICAL DATA:  Found unresponsive, unknown down time EXAM: CT CHEST, ABDOMEN, AND PELVIS WITH CONTRAST CT THORACIC AND LUMBAR SPINE WITH CONTRAST TECHNIQUE: Multidetector CT imaging of the chest, abdomen and pelvis was performed following the standard protocol during bolus administration of intravenous contrast. Multidetector CT imaging of the thoracic and lumbar spine was performed following the standard protocol during bolus administration of  intravenous contrast. RADIATION DOSE REDUCTION: This exam was performed according to the departmental dose-optimization program which includes automated exposure control, adjustment of the mA and/or kV according to patient size and/or use of iterative reconstruction technique. CONTRAST:  75mL OMNIPAQUE IOHEXOL 350 MG/ML SOLN COMPARISON:  CT abdomen pelvis, 04/22/2014 FINDINGS: CT CHEST FINDINGS Cardiovascular: Left chest multi lead pacer. Aortic atherosclerosis. Normal heart size. Scattered left and right coronary artery calcifications. No pericardial effusion. Mediastinum/Nodes: No enlarged mediastinal, hilar, or axillary lymph nodes. Thyroid gland, trachea, and esophagus demonstrate no significant findings. Lungs/Pleura: Lungs are clear. No pleural effusion or pneumothorax. Musculoskeletal: No chest wall mass or suspicious osseous lesions identified. CT ABDOMEN PELVIS FINDINGS Hepatobiliary: No focal liver abnormality is seen. Status post cholecystectomy. Similar severe postoperative biliary ductal dilatation. Pancreas: Unremarkable. No pancreatic ductal dilatation or surrounding inflammatory changes. Spleen: Normal in size without significant abnormality. Adrenals/Urinary Tract: Adrenal glands are unremarkable. Kidneys are normal, without renal calculi, solid lesion, or hydronephrosis. Bladder is unremarkable. Stomach/Bowel: Stomach is within normal limits. Appendix not clearly visualized. No evidence of bowel wall thickening, distention, or inflammatory changes. Vascular/Lymphatic: Aortic atherosclerosis. 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 No enlarged abdominal or pelvic lymph nodes. Reproductive: No mass or other abnormality. Other: No abdominal wall hernia or abnormality. No ascites. Musculoskeletal: No acute osseous findings. CT THORACIC AND LUMBAR SPINE FINDINGS Alignment: Normal thoracic kyphosis. Normal lumbar lordosis. Vertebral  bodies: No acute fracture or dislocation. Unchanged mild superior endplate wedge deformity of L2, with approximately 20% height loss. Disc spaces: Generally mild multilevel disc space height  loss and osteophytosis throughout the thoracic and lumbar spine, focally moderate at L3-L4. Paraspinous soft tissues: Unremarkable. IMPRESSION: 1. No CT evidence of acute traumatic injury to the chest, abdomen, or pelvis. 2. No acute fracture or dislocation of the thoracic or lumbar spine. Unchanged mild, chronic wedge deformity of L2. 3. Status post cholecystectomy. Similar severe postoperative biliary ductal dilatation. 4. Coronary artery disease. Aortic Atherosclerosis (ICD10-I70.0). Electronically Signed   By: Jearld Lesch M.D.   On: 05/13/2023 17:28   CT T-SPINE NO CHARGE Result Date: 05/13/2023 CLINICAL DATA:  Found unresponsive, unknown down time EXAM: CT CHEST, ABDOMEN, AND PELVIS WITH CONTRAST CT THORACIC AND LUMBAR SPINE WITH CONTRAST TECHNIQUE: Multidetector CT imaging of the chest, abdomen and pelvis was performed following the standard protocol during bolus administration of intravenous contrast. Multidetector CT imaging of the thoracic and lumbar spine was performed following the standard protocol during bolus administration of intravenous contrast. RADIATION DOSE REDUCTION: This exam was performed according to the departmental dose-optimization program which includes automated exposure control, adjustment of the mA and/or kV according to patient size and/or use of iterative reconstruction technique. CONTRAST:  75mL OMNIPAQUE IOHEXOL 350 MG/ML SOLN COMPARISON:  CT abdomen pelvis, 04/22/2014 FINDINGS: CT CHEST FINDINGS Cardiovascular: Left chest multi lead pacer. Aortic atherosclerosis. Normal heart size. Scattered left and right coronary artery calcifications. No pericardial effusion. Mediastinum/Nodes: No enlarged mediastinal, hilar, or axillary lymph nodes. Thyroid gland, trachea, and esophagus demonstrate no  significant findings. Lungs/Pleura: Lungs are clear. No pleural effusion or pneumothorax. Musculoskeletal: No chest wall mass or suspicious osseous lesions identified. CT ABDOMEN PELVIS FINDINGS Hepatobiliary: No focal liver abnormality is seen. Status post cholecystectomy. Similar severe postoperative biliary ductal dilatation. Pancreas: Unremarkable. No pancreatic ductal dilatation or surrounding inflammatory changes. Spleen: Normal in size without significant abnormality. Adrenals/Urinary Tract: Adrenal glands are unremarkable. Kidneys are normal, without renal calculi, solid lesion, or hydronephrosis. Bladder is unremarkable. Stomach/Bowel: Stomach is within normal limits. Appendix not clearly visualized. No evidence of bowel wall thickening, distention, or inflammatory changes. Vascular/Lymphatic: Aortic atherosclerosis. 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 No enlarged abdominal or pelvic lymph nodes. Reproductive: No mass or other abnormality. Other: No abdominal wall hernia or abnormality. No ascites. Musculoskeletal: No acute osseous findings. CT THORACIC AND LUMBAR SPINE FINDINGS Alignment: Normal thoracic kyphosis. Normal lumbar lordosis. Vertebral bodies: No acute fracture or dislocation. Unchanged mild superior endplate wedge deformity of L2, with approximately 20% height loss. Disc spaces: Generally mild multilevel disc space height loss and osteophytosis throughout the thoracic and lumbar spine, focally moderate at L3-L4. Paraspinous soft tissues: Unremarkable. IMPRESSION: 1. No CT evidence of acute traumatic injury to the chest, abdomen, or pelvis. 2. No acute fracture or dislocation of the thoracic or lumbar spine. Unchanged mild, chronic wedge deformity of L2. 3. Status post cholecystectomy. Similar severe postoperative biliary ductal dilatation. 4. Coronary artery disease. Aortic Atherosclerosis (ICD10-I70.0). Electronically  Signed   By: Jearld Lesch M.D.   On: 05/13/2023 17:28   CT HEAD WO CONTRAST Result Date: 05/13/2023 CLINICAL DATA:  Found unresponsive, unknown downtime EXAM: CT HEAD WITHOUT CONTRAST CT MAXILLOFACIAL WITHOUT CONTRAST CT CERVICAL SPINE WITHOUT CONTRAST TECHNIQUE: Multidetector CT imaging of the head, cervical spine, and maxillofacial structures were performed using the standard protocol without intravenous contrast. Multiplanar CT image reconstructions of the cervical spine and maxillofacial structures were also generated. RADIATION DOSE REDUCTION: This exam was performed according to the departmental dose-optimization program which includes automated exposure control, adjustment of  the mA and/or kV according to patient size and/or use of iterative reconstruction technique. COMPARISON:  04/10/2023 FINDINGS: CT HEAD FINDINGS Brain: No evidence of acute infarction, hemorrhage, hydrocephalus, extra-axial collection or mass lesion/mass effect. Mild periventricular and deep white matter hypodensity. Unchanged left occipital encephalomalacia. Vascular: No hyperdense vessel or unexpected calcification. CT FACIAL BONES FINDINGS Skull: Normal. Negative for fracture or focal lesion. Facial bones: No displaced fractures or dislocations. Sinuses/Orbits: Small air-fluid level in the right maxillary sinus. No overlying fracture. Other: Patient is partially edentulous with erosion of the left maxillary alveolar bone into the left maxillary sinus (series 7, image 31). CT CERVICAL SPINE FINDINGS Alignment: Normal. Skull base and vertebrae: No acute fracture. No primary bone lesion or focal pathologic process. Soft tissues and spinal canal: No prevertebral fluid or swelling. No visible canal hematoma. Disc levels: Focally moderate disc space height loss and osteophytosis of the lower cervical levels C5-C7 Upper chest: Negative. Other: None. IMPRESSION: 1. No acute intracranial pathology. Small-vessel white matter disease and  unchanged left occipital encephalomalacia. 2. No displaced fractures or dislocations of the facial bones. 3. Small air-fluid level in the right maxillary sinus. No overlying fracture. Patient is partially edentulous with erosion of the left maxillary alveolar bone into the left maxillary sinus. Correlate for sinusitis. 4. No fracture or static subluxation of the cervical spine. 5. Focally moderate disc space height loss and osteophytosis of the lower cervical levels. Electronically Signed   By: Jearld Lesch M.D.   On: 05/13/2023 17:20   CT CERVICAL SPINE WO CONTRAST Result Date: 05/13/2023 CLINICAL DATA:  Found unresponsive, unknown downtime EXAM: CT HEAD WITHOUT CONTRAST CT MAXILLOFACIAL WITHOUT CONTRAST CT CERVICAL SPINE WITHOUT CONTRAST TECHNIQUE: Multidetector CT imaging of the head, cervical spine, and maxillofacial structures were performed using the standard protocol without intravenous contrast. Multiplanar CT image reconstructions of the cervical spine and maxillofacial structures were also generated. RADIATION DOSE REDUCTION: This exam was performed according to the departmental dose-optimization program which includes automated exposure control, adjustment of the mA and/or kV according to patient size and/or use of iterative reconstruction technique. COMPARISON:  04/10/2023 FINDINGS: CT HEAD FINDINGS Brain: No evidence of acute infarction, hemorrhage, hydrocephalus, extra-axial collection or mass lesion/mass effect. Mild periventricular and deep white matter hypodensity. Unchanged left occipital encephalomalacia. Vascular: No hyperdense vessel or unexpected calcification. CT FACIAL BONES FINDINGS Skull: Normal. Negative for fracture or focal lesion. Facial bones: No displaced fractures or dislocations. Sinuses/Orbits: Small air-fluid level in the right maxillary sinus. No overlying fracture. Other: Patient is partially edentulous with erosion of the left maxillary alveolar bone into the left maxillary  sinus (series 7, image 31). CT CERVICAL SPINE FINDINGS Alignment: Normal. Skull base and vertebrae: No acute fracture. No primary bone lesion or focal pathologic process. Soft tissues and spinal canal: No prevertebral fluid or swelling. No visible canal hematoma. Disc levels: Focally moderate disc space height loss and osteophytosis of the lower cervical levels C5-C7 Upper chest: Negative. Other: None. IMPRESSION: 1. No acute intracranial pathology. Small-vessel white matter disease and unchanged left occipital encephalomalacia. 2. No displaced fractures or dislocations of the facial bones. 3. Small air-fluid level in the right maxillary sinus. No overlying fracture. Patient is partially edentulous with erosion of the left maxillary alveolar bone into the left maxillary sinus. Correlate for sinusitis. 4. No fracture or static subluxation of the cervical spine. 5. Focally moderate disc space height loss and osteophytosis of the lower cervical levels. Electronically Signed   By: Jearld Lesch M.D.   On:  05/13/2023 17:20   CT MAXILLOFACIAL WO CONTRAST Result Date: 05/13/2023 CLINICAL DATA:  Found unresponsive, unknown downtime EXAM: CT HEAD WITHOUT CONTRAST CT MAXILLOFACIAL WITHOUT CONTRAST CT CERVICAL SPINE WITHOUT CONTRAST TECHNIQUE: Multidetector CT imaging of the head, cervical spine, and maxillofacial structures were performed using the standard protocol without intravenous contrast. Multiplanar CT image reconstructions of the cervical spine and maxillofacial structures were also generated. RADIATION DOSE REDUCTION: This exam was performed according to the departmental dose-optimization program which includes automated exposure control, adjustment of the mA and/or kV according to patient size and/or use of iterative reconstruction technique. COMPARISON:  04/10/2023 FINDINGS: CT HEAD FINDINGS Brain: No evidence of acute infarction, hemorrhage, hydrocephalus, extra-axial collection or mass lesion/mass effect. Mild  periventricular and deep white matter hypodensity. Unchanged left occipital encephalomalacia. Vascular: No hyperdense vessel or unexpected calcification. CT FACIAL BONES FINDINGS Skull: Normal. Negative for fracture or focal lesion. Facial bones: No displaced fractures or dislocations. Sinuses/Orbits: Small air-fluid level in the right maxillary sinus. No overlying fracture. Other: Patient is partially edentulous with erosion of the left maxillary alveolar bone into the left maxillary sinus (series 7, image 31). CT CERVICAL SPINE FINDINGS Alignment: Normal. Skull base and vertebrae: No acute fracture. No primary bone lesion or focal pathologic process. Soft tissues and spinal canal: No prevertebral fluid or swelling. No visible canal hematoma. Disc levels: Focally moderate disc space height loss and osteophytosis of the lower cervical levels C5-C7 Upper chest: Negative. Other: None. IMPRESSION: 1. No acute intracranial pathology. Small-vessel white matter disease and unchanged left occipital encephalomalacia. 2. No displaced fractures or dislocations of the facial bones. 3. Small air-fluid level in the right maxillary sinus. No overlying fracture. Patient is partially edentulous with erosion of the left maxillary alveolar bone into the left maxillary sinus. Correlate for sinusitis. 4. No fracture or static subluxation of the cervical spine. 5. Focally moderate disc space height loss and osteophytosis of the lower cervical levels. Electronically Signed   By: Jearld Lesch M.D.   On: 05/13/2023 17:20     PHYSICAL EXAM  Temp:  [98.1 F (36.7 C)-98.9 F (37.2 C)] 98.4 F (36.9 C) (03/07 0800) Pulse Rate:  [44-82] 73 (03/07 1100) Resp:  [18-39] 29 (03/07 1100) BP: (105-160)/(40-112) 156/56 (03/07 1100) SpO2:  [89 %-98 %] 95 % (03/07 1100)  General - Well nourished, well developed, not in distress  Ophthalmologic - fundi not visualized due to noncooperation.  Cardiovascular - irregularly irregular heart  rate and rhythm.  Neuro - sitting in chair, awake, alert, eyes open, global aphasia, nonverbal and not following commands except closed eyes on request. Left gaze preference but able to cross midline, tracking more on the left, blinking to visual threat on the left but not consistent on the right. R facial droop with increased right palpebral fissure. Tongue protrusion not cooperative. LUE at least 3/5, RUE flaccid but withdraw to pain. Bilaterally LEs proximal 3/5 and distal LLE 3/5 and RLE 3-/5. Sensation, coordination and gait not tested.    ASSESSMENT/PLAN Ruth Roth is a 89 y.o. female with history of HFpEF, pacemaker, A-fib on Xarelto, HLD, HTN, CAD, PVD admitted for right side weakness, aphasia, found down at home. No TNK given due to on Xarelto.    Stroke:  left MCA scattered infarcts embolic secondary to A-fib even on Xarelto CT no acute abnormality, left occipital encephalomalacia CT head and neck left M1 occlusion.  60% stenosis left ICA at the skull base CTP 0/50 cc Status post IR left M1 occlusion  with TICI3 MRI multifocal acute infarcts throughout left MCA territory MRA distal left M1 MCA stenosis versus occlusion now patent 2D Echo pending LDL 67 HgbA1c 5.4 Lovenox for VTE prophylaxis Xarelto (rivaroxaban) daily prior to admission, now on aspirin 300 mg suppository daily, will switch to heparin IV. Switch to eliquis once renal function much improved. Ongoing aggressive stroke risk factor management Therapy recommendations: SNF Disposition: Pending  A-fib RVR Cardiology on board on diltiazem IV  Now off amiodarone IV On metoprolol IV 2.5 mg every 6 hours Rate controlled Will start heparin IV today.   Respiratory failure, resolved CHF Extubated 05/14/2023, tolerating well Developed AKI and fluid overload Now on lasix Cardiology and nephrology on board Management per primary team  Hypertension Stable Off Cleviprex BP goal less than 180/105 Long term BP goal  normotensive  Hyperlipidemia Home meds: None LDL 67, goal < 70 Statin intolerance No statin needed given LDL at goal  AKI Creatinine 1.24--1.10--1.19--2.34--3.61 Off IV fluid now On lasix Nephrology on board Management per primary team  Dysphagia Did not pass swallow On n.p.o. Cortrak placed Tube feeding initiation per primary team and nephrology   Other Stroke Risk Factors Advanced age HFpEF CAD  Other Active Problems Pacemaker in place PUD Slight leukocytosis, WBC 10.6--8.9--8.8  Hospital day # 3  I discussed with Dr. Marland Mcalpine. I spent extensive inpatient time with the patient, more than 50% of which was spent in counseling and coordination of care, reviewing test results, images and medication, and discussing the diagnosis, treatment plan and potential prognosis. This patient's care requiresreview of multiple databases, neurological assessment, discussion with family, other specialists and medical decision making of high complexity.    Marvel Plan, MD PhD Stroke Neurology 05/16/2023 11:48 AM    To contact Stroke Continuity provider, please refer to [email or web address]. After hours, contact General Neurology

## 2023-05-17 ENCOUNTER — Inpatient Hospital Stay (HOSPITAL_COMMUNITY)

## 2023-05-17 DIAGNOSIS — I4891 Unspecified atrial fibrillation: Secondary | ICD-10-CM

## 2023-05-17 DIAGNOSIS — I5032 Chronic diastolic (congestive) heart failure: Secondary | ICD-10-CM | POA: Diagnosis not present

## 2023-05-17 DIAGNOSIS — I484 Atypical atrial flutter: Secondary | ICD-10-CM | POA: Diagnosis not present

## 2023-05-17 DIAGNOSIS — G9341 Metabolic encephalopathy: Secondary | ICD-10-CM | POA: Diagnosis not present

## 2023-05-17 DIAGNOSIS — Z950 Presence of cardiac pacemaker: Secondary | ICD-10-CM | POA: Diagnosis not present

## 2023-05-17 DIAGNOSIS — I483 Typical atrial flutter: Secondary | ICD-10-CM | POA: Diagnosis not present

## 2023-05-17 DIAGNOSIS — I63412 Cerebral infarction due to embolism of left middle cerebral artery: Secondary | ICD-10-CM | POA: Diagnosis not present

## 2023-05-17 LAB — PHOSPHORUS
Phosphorus: 6.6 mg/dL — ABNORMAL HIGH (ref 2.5–4.6)
Phosphorus: 6.1 mg/dL — ABNORMAL HIGH (ref 2.5–4.6)

## 2023-05-17 LAB — COMPREHENSIVE METABOLIC PANEL WITH GFR
ALT: 39 U/L (ref 0–44)
AST: 28 U/L (ref 15–41)
Albumin: 3.3 g/dL — ABNORMAL LOW (ref 3.5–5.0)
Alkaline Phosphatase: 82 U/L (ref 38–126)
Anion gap: 13 (ref 5–15)
BUN: 64 mg/dL — ABNORMAL HIGH (ref 8–23)
CO2: 18 mmol/L — ABNORMAL LOW (ref 22–32)
Calcium: 9.3 mg/dL (ref 8.9–10.3)
Chloride: 116 mmol/L — ABNORMAL HIGH (ref 98–111)
Creatinine, Ser: 4.49 mg/dL — ABNORMAL HIGH (ref 0.44–1.00)
GFR, Estimated: 9 mL/min — ABNORMAL LOW
Glucose, Bld: 135 mg/dL — ABNORMAL HIGH (ref 70–99)
Potassium: 4.1 mmol/L (ref 3.5–5.1)
Sodium: 147 mmol/L — ABNORMAL HIGH (ref 135–145)
Total Bilirubin: 0.8 mg/dL (ref 0.0–1.2)
Total Protein: 6.2 g/dL — ABNORMAL LOW (ref 6.5–8.1)

## 2023-05-17 LAB — CBC WITH DIFFERENTIAL/PLATELET
Abs Immature Granulocytes: 0.05 10*3/uL (ref 0.00–0.07)
Basophils Absolute: 0 10*3/uL (ref 0.0–0.1)
Basophils Relative: 0 %
Eosinophils Absolute: 0.1 10*3/uL (ref 0.0–0.5)
Eosinophils Relative: 1 %
HCT: 34.2 % — ABNORMAL LOW (ref 36.0–46.0)
Hemoglobin: 10.9 g/dL — ABNORMAL LOW (ref 12.0–15.0)
Immature Granulocytes: 1 %
Lymphocytes Relative: 5 %
Lymphs Abs: 0.5 10*3/uL — ABNORMAL LOW (ref 0.7–4.0)
MCH: 30.9 pg (ref 26.0–34.0)
MCHC: 31.9 g/dL (ref 30.0–36.0)
MCV: 96.9 fL (ref 80.0–100.0)
Monocytes Absolute: 0.8 10*3/uL (ref 0.1–1.0)
Monocytes Relative: 8 %
Neutro Abs: 8 10*3/uL — ABNORMAL HIGH (ref 1.7–7.7)
Neutrophils Relative %: 85 %
Platelets: 164 10*3/uL (ref 150–400)
RBC: 3.53 MIL/uL — ABNORMAL LOW (ref 3.87–5.11)
RDW: 18.3 % — ABNORMAL HIGH (ref 11.5–15.5)
WBC: 9.4 10*3/uL (ref 4.0–10.5)
nRBC: 0 % (ref 0.0–0.2)

## 2023-05-17 LAB — GLUCOSE, CAPILLARY
Glucose-Capillary: 139 mg/dL — ABNORMAL HIGH (ref 70–99)
Glucose-Capillary: 140 mg/dL — ABNORMAL HIGH (ref 70–99)
Glucose-Capillary: 154 mg/dL — ABNORMAL HIGH (ref 70–99)
Glucose-Capillary: 159 mg/dL — ABNORMAL HIGH (ref 70–99)
Glucose-Capillary: 171 mg/dL — ABNORMAL HIGH (ref 70–99)
Glucose-Capillary: 172 mg/dL — ABNORMAL HIGH (ref 70–99)

## 2023-05-17 LAB — HEPARIN LEVEL (UNFRACTIONATED)
Heparin Unfractionated: 0.62 [IU]/mL (ref 0.30–0.70)
Heparin Unfractionated: 0.41 [IU]/mL (ref 0.30–0.70)

## 2023-05-17 LAB — MAGNESIUM
Magnesium: 2.2 mg/dL (ref 1.7–2.4)
Magnesium: 2.3 mg/dL (ref 1.7–2.4)

## 2023-05-17 MED ORDER — ORAL CARE MOUTH RINSE
15.0000 mL | OROMUCOSAL | Status: DC
  Administered 2023-05-19 – 2023-05-22 (×6): 15 mL via OROMUCOSAL

## 2023-05-17 MED ORDER — POLYETHYLENE GLYCOL 3350 17 G PO PACK: 17.0000 g | PACK | Freq: Every day | ORAL | Status: DC | PRN

## 2023-05-17 MED ORDER — ORAL CARE MOUTH RINSE: 15.0000 mL | OROMUCOSAL | Status: DC | PRN

## 2023-05-17 MED ORDER — INSULIN ASPART 100 UNIT/ML IJ SOLN
0.0000 [IU] | INTRAMUSCULAR | Status: DC
  Administered 2023-05-17 – 2023-05-21 (×9): 1 [IU] via SUBCUTANEOUS

## 2023-05-17 MED ORDER — FUROSEMIDE 10 MG/ML IJ SOLN
160.0000 mg | Freq: Two times a day (BID) | INTRAVENOUS | Status: DC
  Administered 2023-05-17 – 2023-05-19 (×5): 160 mg via INTRAVENOUS
  Filled 2023-05-17: qty 10
  Filled 2023-05-17 (×5): qty 16
  Filled 2023-05-17: qty 4

## 2023-05-17 MED ORDER — RACEPINEPHRINE HCL 2.25 % IN NEBU
INHALATION_SOLUTION | RESPIRATORY_TRACT | Status: AC
  Administered 2023-05-17: 0.5 mL
  Filled 2023-05-17: qty 0.5

## 2023-05-17 NOTE — Progress Notes (Signed)
 Admit: 05/13/2023 LOS: 4  61F with AKI on CKD3, oliguric after presenting with ischemic CVA undergoing mechanical thrombectomy    Subjective:  Progressive hypoxia now on 10 L high flow nasal cannula 0.7 L UOP which was increased after Lasix Awake but not engaging  03/07 0701 - 03/08 0700 In: 956 [I.V.:152.9; NG/GT:741.3; IV Piggyback:61.7] Out: 700 [Urine:700]  Filed Weights   05/14/23 0100 05/14/23 0128 05/17/23 0419  Weight: 60.7 kg 62.2 kg 62.5 kg    Scheduled Meds:  arformoterol  15 mcg Nebulization BID   aspirin  81 mg Per Tube Daily   budesonide (PULMICORT) nebulizer solution  0.25 mg Nebulization BID   Chlorhexidine Gluconate Cloth  6 each Topical Daily   diltiazem  60 mg Per Tube Q8H   docusate  100 mg Per Tube BID   feeding supplement (PROSource TF20)  60 mL Per Tube Daily   Gerhardt's butt cream   Topical TID   insulin aspart  0-6 Units Subcutaneous Q4H   ipratropium  0.5 mg Nebulization TID   levalbuterol  0.63 mg Nebulization TID   metoprolol tartrate  50 mg Per Tube BID   mupirocin ointment  1 Application Nasal BID   polyethylene glycol  17 g Per Tube Daily   sodium bicarbonate  650 mg Per Tube BID   thiamine  100 mg Per Tube Daily   Continuous Infusions:  feeding supplement (OSMOLITE 1.5 CAL) 40 mL/hr at 05/17/23 0800   furosemide 160 mg (05/17/23 0902)   heparin 600 Units/hr (05/17/23 0811)   PRN Meds:.acetaminophen **OR** acetaminophen (TYLENOL) oral liquid 160 mg/5 mL **OR** acetaminophen, bisacodyl, dextrose, iohexol, levalbuterol, mouth rinse, senna-docusate  Current Labs: reviewed    Physical Exam:  Blood pressure (!) 153/65, pulse 60, temperature 99.6 F (37.6 C), temperature source Axillary, resp. rate (!) 27, height 5\' 3"  (1.6 m), weight 62.5 kg, SpO2 95%. GEN: Awake, alert, tracking but not able to speak; aphasia ENT: NCAT with NG tube in place EYES: EOMI CV: Irregularly irregular, normal rate PULM: Clear bilaterally ABD: Soft,  nontender SKIN: No rashes or lesions EXT: No significant edema GU: Foley catheter present  A AKI on CKD 3 likely due to ATN from contrast exposure and acute illness; urine output following, now oliguric. Progressive hypervolemia at least 5 L positive from presentation developing some pulmonary edema Metabolic acidosis from #1 Acute ischemic left MCA CVA status post mechanical thrombectomy 3//25 Atrial fib/flutter currently on heparin  P With decent urine output after Lasix will start twice daily 160 mg IV No strong indication for RRT, I think that she is a poor candidate even though she was fairly independent at baseline and would recommend against --she is much different after the stroke Will follow closely Medication Issues; Preferred narcotic agents for pain control are hydromorphone, fentanyl, and methadone. Morphine should not be used.  Baclofen should be avoided Avoid oral sodium phosphate and magnesium citrate based laxatives / bowel preps    Sabra Heck MD 05/17/2023, 9:38 AM  Recent Labs  Lab 05/15/23 0538 05/16/23 0501 05/17/23 0539  NA 140 143 147*  K 4.1 3.8 4.1  CL 112* 114* 116*  CO2 15* 14* 18*  GLUCOSE 91 89 135*  BUN 35* 45* 64*  CREATININE 2.34* 3.61* 4.49*  CALCIUM 8.6* 8.6* 9.3  PHOS  --  6.0* 6.6*   Recent Labs  Lab 05/14/23 0455 05/15/23 0538 05/16/23 0501 05/17/23 0539  WBC 10.6* 8.9 8.8 9.4  NEUTROABS 8.9*  --  7.4 8.0*  HGB 13.1 10.7* 10.3* 10.9*  HCT 41.4 34.2* 33.2* 34.2*  MCV 97.6 99.7 100.0 96.9  PLT 170 155 138* 164

## 2023-05-17 NOTE — Progress Notes (Signed)
 PHARMACY - ANTICOAGULATION CONSULT NOTE  Pharmacy Consult for heparin Indication: atrial fibrillation  Allergies  Allergen Reactions   Statins Other (See Comments)    Liver damage    Hydroxychloroquine Hives   Haldol [Haloperidol Lactate] Rash   Librax [Chlordiazepoxide-Clidinium] Rash   Plavix [Clopidogrel Bisulfate] Rash   Ramipril Rash    Patient Measurements: Height: 5\' 3"  (160 cm) Weight: 62.5 kg (137 lb 12.6 oz) IBW/kg (Calculated) : 52.4 Heparin Dosing Weight: 63.5kg  Vital Signs: Temp: 98.7 F (37.1 C) (03/08 1600) Temp Source: Axillary (03/08 1600) BP: 147/59 (03/08 1800) Pulse Rate: 64 (03/08 1800)  Labs: Recent Labs    05/15/23 0538 05/15/23 1244 05/16/23 0501 05/16/23 2126 05/17/23 0539 05/17/23 1444  HGB 10.7*  --  10.3*  --  10.9*  --   HCT 34.2*  --  33.2*  --  34.2*  --   PLT 155  --  138*  --  164  --   APTT  --   --   --  117*  --   --   HEPARINUNFRC  --   --   --  0.78* 0.62 0.41  CREATININE 2.34*  --  3.61*  --  4.49*  --   CKTOTAL  --  311* 327*  --   --   --     Estimated Creatinine Clearance: 6.6 mL/min (A) (by C-G formula based on SCr of 4.49 mg/dL (H)).   Medical History: Past Medical History:  Diagnosis Date   (HFpEF) heart failure with preserved ejection fraction (HCC)    a. 07/2017 Echo: EF 50-55%, no rwma, mild AS/AI, mod MR, mod dil LA, mod TR, PASP ; b. 03/2021 Echo: EF 60-65%. No rwma. Nl RV fxn. Sev BAE. Mild MR. Mild AI/AS.   Atrial fibrillation (HCC)    Autoimmune hepatitis (HCC)    followed by Dr Marva Panda   CHF (congestive heart failure) [facility])    Fibrocystic breast disease    Glaucoma    Hypercholesterolemia    Hypertension    Hypothyroidism    Inflammatory arthritis    MI (myocardial infarction) (HCC)    Mitral regurgitation    a. 07/2017 Echo: Mod MR; b. 08/2017 Cath: 4+/Severe MR; c. 08/2017 TEE: Mild MR; d. 03/2021 Echo: Mild MR.   Neuropathy    Non-obstructive Coronary artery disease    a. 05/2014  NSTEMI/Cath: LM nl, LAD mild dzs, LCX 60ost hazy (? culprit), RCA mild dzs. EF 60% by echo-->Med Rx; b. 08/2017 Cath: LM 30ost, LAD min irregs, LCX small, 40ost/p, RCA large, min irregs, RPDA/RPL1 nl, EF 50-55%. 4+MR.   Osteoarthritis    Permanent atrial fibrillation (HCC)    a. CHA2DS2VASc = 6-->Xarelto.   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 Underwent thrombectomy 3/5 achieving TICI3 revascularization. Stroke team cleared her to start anticoagulation. Pharmacy consulted to start heparin for afib.  Heparin level 0.41 is therapeutic on 600 units/hr.    Goal of Therapy:  Heparin level 0.3-0.5 units/ml Monitor platelets by anticoagulation protocol: Yes   Plan:   Continue heparin at 600 units/hr Next heparin level with AM labs  [employer], Pharm.D., BCPS Clinical Pharmacist  **Pharmacist phone directory can be found on [email or web address] listed under [facility].  05/17/2023  7:46 PM

## 2023-05-17 NOTE — Progress Notes (Signed)
 PHARMACY - ANTICOAGULATION CONSULT NOTE  Pharmacy Consult for heparin Indication: atrial fibrillation  Allergies  Allergen Reactions   Statins Other (See Comments)    Liver damage    Hydroxychloroquine Hives   Haldol [Haloperidol Lactate] Rash   Librax [Chlordiazepoxide-Clidinium] Rash   Plavix [Clopidogrel Bisulfate] Rash   Ramipril Rash    Patient Measurements: Height: 5\' 3"  (160 cm) Weight: 62.5 kg (137 lb 12.6 oz) IBW/kg (Calculated) : 52.4 Heparin Dosing Weight: 63.5kg  Vital Signs: Temp: 99.8 F (37.7 C) (03/08 0400) Temp Source: Axillary (03/08 0400) BP: 148/84 (03/08 0600) Pulse Rate: 60 (03/08 0600)  Labs: Recent Labs    05/15/23 0538 05/15/23 1244 05/16/23 0501 05/16/23 2126 05/17/23 0539  HGB 10.7*  --  10.3*  --  10.9*  HCT 34.2*  --  33.2*  --  34.2*  PLT 155  --  138*  --  164  APTT  --   --   --  117*  --   HEPARINUNFRC  --   --   --  0.78* 0.62  CREATININE 2.34*  --  3.61*  --   --   CKTOTAL  --  311* 327*  --   --     Estimated Creatinine Clearance: 8.2 mL/min (A) (by C-G formula based on SCr of 3.61 mg/dL (H)).   Medical History: Past Medical History:  Diagnosis Date   (HFpEF) heart failure with preserved ejection fraction (HCC)    a. 07/2017 Echo: EF 50-55%, no rwma, mild AS/AI, mod MR, mod dil LA, mod TR, PASP ; b. 03/2021 Echo: EF 60-65%. No rwma. Nl RV fxn. Sev BAE. Mild MR. Mild AI/AS.   Atrial fibrillation (HCC)    Autoimmune hepatitis (HCC)    followed by Dr Marva Panda   CHF (congestive heart failure) [facility])    Fibrocystic breast disease    Glaucoma    Hypercholesterolemia    Hypertension    Hypothyroidism    Inflammatory arthritis    MI (myocardial infarction) (HCC)    Mitral regurgitation    a. 07/2017 Echo: Mod MR; b. 08/2017 Cath: 4+/Severe MR; c. 08/2017 TEE: Mild MR; d. 03/2021 Echo: Mild MR.   Neuropathy    Non-obstructive Coronary artery disease    a. 05/2014 NSTEMI/Cath: LM nl, LAD mild dzs, LCX 60ost hazy (?  culprit), RCA mild dzs. EF 60% by echo-->Med Rx; b. 08/2017 Cath: LM 30ost, LAD min irregs, LCX small, 40ost/p, RCA large, min irregs, RPDA/RPL1 nl, EF 50-55%. 4+MR.   Osteoarthritis    Permanent atrial fibrillation (HCC)    a. CHA2DS2VASc = 6-->Xarelto.   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 Underwent thrombectomy 3/5 achieving TICI3 revascularization. Stroke team cleared her to start anticoagulation. Pharmacy consulted to start heparin for afib.  Heparin level 0.78 is supratherapeutic on 850 units/hr.  Heparin is running in L PIV and level was drawn from R hand. No issues with infusion or bleeding per RN.   3/8 AM update:  Heparin level supra-therapeutic   Goal of Therapy:  Heparin level 0.3-0.5 units/ml Monitor platelets by anticoagulation protocol: Yes   Plan:   Dec heparin to 600 units/hr Heparin level in 8 hours  Abran Duke, PharmD, BCPS Clinical Pharmacist Phone: [phone]

## 2023-05-17 NOTE — Progress Notes (Signed)
 STROKE TEAM PROGRESS NOTE   SUBJECTIVE (INTERVAL HISTORY) Patient son and daughter arrived after rounds at bedside.  Patient is lying in bed and still has mild tachypnea and some wheezing, but awake alert, able to close eyes on request but not following other commands, nonverbal and globally aphasic.  Her oxygen requirements have increased.  Cardiology planning diuresis with Lasix.Marland Kitchen AKI continues to worsen worsen even with hydration.    OBJECTIVE Temp:  [98.6 F (37 C)-99.8 F (37.7 C)] 99.4 F (37.4 C) (03/08 1155) Pulse Rate:  [59-84] 67 (03/08 1300) Cardiac Rhythm: Ventricular paced;Atrial fibrillation (03/08 1100) Resp:  [16-33] 24 (03/08 1300) BP: (130-168)/(41-133) 145/89 (03/08 1300) SpO2:  [88 %-96 %] 94 % (03/08 1300) Weight:  [62.5 kg] 62.5 kg (03/08 0419)  Recent Labs  Lab 05/16/23 2007 05/16/23 2357 05/17/23 0346 05/17/23 0742 05/17/23 1142  GLUCAP 145* 154* 172* 159* 139*   Recent Labs  Lab 05/13/23 1518 05/13/23 1526 05/13/23 1857 05/14/23 0155 05/14/23 0455 05/15/23 0538 05/16/23 0501 05/17/23 0539  NA 141 143   < > 140 142 140 143 147*  K 4.1 4.1   < > 3.3* 3.7 4.1 3.8 4.1  CL 109 113*  --   --  108 112* 114* 116*  CO2 17*  --   --   --  19* 15* 14* 18*  GLUCOSE 138* 135*  --   --  145* 91 89 135*  BUN 29* 31*  --   --  31* 35* 45* 64*  CREATININE 1.24* 1.10*  --   --  1.19* 2.34* 3.61* 4.49*  CALCIUM 9.5  --   --   --  9.0 8.6* 8.6* 9.3  MG 2.0  --   --   --   --   --  2.0 2.2  PHOS  --   --   --   --   --   --  6.0* 6.6*   < > = values in this interval not displayed.   Recent Labs  Lab 05/13/23 1518 05/14/23 0455 05/15/23 0538 05/16/23 0501 05/17/23 0539  AST 74* 43* 33 28 28  ALT 50* 41 37 37 39  ALKPHOS 98 79 70 73 82  BILITOT 1.3* 0.7 0.6 0.6 0.8  PROT 6.8 5.8* 5.7* 5.4* 6.2*  ALBUMIN 3.8 3.2* 3.5 3.2* 3.3*   Recent Labs  Lab 05/13/23 1518 05/13/23 1526 05/14/23 0155 05/14/23 0455 05/15/23 0538 05/16/23 0501 05/17/23 0539   WBC 14.0*  --   --  10.6* 8.9 8.8 9.4  NEUTROABS  --   --   --  8.9*  --  7.4 8.0*  HGB 15.6*   < > 13.6 13.1 10.7* 10.3* 10.9*  HCT 49.2*   < > 40.0 41.4 34.2* 33.2* 34.2*  MCV 98.0  --   --  97.6 99.7 100.0 96.9  PLT 206  --   --  170 155 138* 164   < > = values in this interval not displayed.   Recent Labs  Lab 05/13/23 1518 05/14/23 0455 05/15/23 1244 05/16/23 0501  CKTOTAL 1,135* 472* 311* 327*   No results for input(s): "LABPROT", "INR" in the last 72 hours.  Recent Labs    05/15/23 0933  COLORURINE YELLOW  LABSPEC >1.046*  PHURINE 5.0  GLUCOSEU NEGATIVE  HGBUR MODERATE*  BILIRUBINUR NEGATIVE  KETONESUR NEGATIVE  PROTEINUR 100*  NITRITE NEGATIVE  LEUKOCYTESUR TRACE*       Component Value Date/Time   CHOL 172 05/14/2023 0455  TRIG 189 (H) 05/14/2023 0455   HDL 67 05/14/2023 0455   CHOLHDL 2.6 05/14/2023 0455   VLDL 38 05/14/2023 0455   LDLCALC 67 05/14/2023 0455   Lab Results  Component Value Date   HGBA1C 5.4 05/14/2023   No results found for: "LABOPIA", "COCAINSCRNUR", "LABBENZ", "AMPHETMU", "THCU", "LABBARB"  Recent Labs  Lab 05/13/23 1518  ETH <10    I have personally reviewed the radiological images below and agree with the radiology interpretations.  DG CHEST PORT 1 VIEW Result Date: 05/17/2023 CLINICAL DATA:  [id number] SOB (shortness of breath) [id number] EXAM: PORTABLE CHEST 1 VIEW COMPARISON:  May 16, 2023 FINDINGS: The cardiomediastinal silhouette is unchanged in contour. The enteric tube courses through the chest to the abdomen beyond the field-of-view. LEFT chest cardiac pacing device. Atherosclerotic calcifications. Favor increased layering RIGHT pleural effusion. Favored small LEFT pleural effusion. No pneumothorax. Bibasilar heterogeneous opacities, favored slightly increased throughout the RIGHT lung. Renal contrast of the LEFT kidney. IMPRESSION: 1. Favor increased layering RIGHT pleural effusion with adjacent atelectasis. 2. Favored small LEFT  pleural effusion. 3. Background of interstitial prominence and heterogeneous opacities, possibly reflecting edema versus atypical infection or aspiration. 4. Retained contrast within the LEFT kidney likely reflecting renal impairment. Electronically Signed   By: Meda Klinefelter M.D.   On: 05/17/2023 10:25   DG CHEST PORT 1 VIEW Result Date: 05/16/2023 CLINICAL DATA:  Shortness of breath.  History of stroke EXAM: PORTABLE CHEST 1 VIEW COMPARISON:  X-ray 05/16/2023 earlier FINDINGS: Enteric tube with tip extending beneath the diaphragm. Left upper chest pacemaker. Enlarged cardiopericardial silhouette with calcified aorta. Central vascular congestion. Persistent lung base opacities and effusions, left greater than right. No pneumothorax. Surgical changes along the upper central abdomen. IMPRESSION: No significant oval change when adjusted for technique. Electronically Signed   By: Karen Kays M.D.   On: 05/16/2023 12:42   DG Abd Portable 1V Result Date: 05/16/2023 CLINICAL DATA:  Feeding tube placement EXAM: PORTABLE ABDOMEN - 1 VIEW COMPARISON:  05/14/2023 x-ray FINDINGS: Interval below prior NG tube. New feeding tube with tip crossing to the right of the spine, likely along the extreme distal stomach or very proximal duodenum. Minimal bowel gas in the upper abdomen. Surgical changes. Overlapping cardiac leads. Limited x-ray for tube placement. New left retrocardiac lung opacity. Small pleural effusions. Please see separate chest x-ray IMPRESSION: Limited x-ray for tube placement has feeding tube crossing to the right of the spine, likely distal stomach or very proximal duodenum Electronically Signed   By: Karen Kays M.D.   On: 05/16/2023 12:41   ECHOCARDIOGRAM COMPLETE Result Date: 05/16/2023    ECHOCARDIOGRAM REPORT   Patient Name:   [facility] Date of Exam: 05/16/2023 Medical Rec #:  [id number]    Height:       63.0 in Accession #:    [id number]   Weight:       137.1 lb Date of Birth:  [birth year]    BSA:           1.647 m Patient Age:    89 years     BP:           149/53 mmHg Patient Gender: F            HR:           75 bpm. Exam Location:  Inpatient Procedure: 2D Echo, Cardiac Doppler and Color Doppler (Both Spectral and Color            Flow Doppler were  utilized during procedure). Indications:    Atrial Fibrillation I48.91 Elevated Troponin  History:        Patient has prior history of Echocardiogram examinations, most                 recent 04/04/2022. CHF, Stroke; Aortic Valve Disease.  Sonographer:    Webb Laws Referring Phys: [id number] SUBRINA SUNDIL IMPRESSIONS  1. Left ventricular ejection fraction, by estimation, is 60 to 65%. The left ventricle has normal function. The left ventricle has no regional wall motion abnormalities. There is mild left ventricular hypertrophy. Left ventricular diastolic parameters are indeterminate.  2. Pacing leads seen in RA and RV. Right ventricular systolic function is normal. The right ventricular size is normal.  3. Left atrial size was severely dilated.  4. Right atrial size was severely dilated.  5. The mitral valve is degenerative. Trivial mitral valve regurgitation. No evidence of mitral stenosis. Severe mitral annular calcification.  6. The aortic valve was not well visualized. There is moderate calcification of the aortic valve. There is moderate thickening of the aortic valve. Aortic valve regurgitation is mild. Moderate aortic valve stenosis.  7. The inferior vena cava is normal in size with greater than 50% respiratory variability, suggesting right atrial pressure of 3 mmHg. FINDINGS  Left Ventricle: Left ventricular ejection fraction, by estimation, is 60 to 65%. The left ventricle has normal function. The left ventricle has no regional wall motion abnormalities. Strain was performed and the global longitudinal strain is indeterminate. The left ventricular internal cavity size was normal in size. There is mild left ventricular hypertrophy. Left ventricular  diastolic parameters are indeterminate. Right Ventricle: Pacing leads seen in RA and RV. The right ventricular size is normal. No increase in right ventricular wall thickness. Right ventricular systolic function is normal. Left Atrium: Left atrial size was severely dilated. Right Atrium: Right atrial size was severely dilated. Pericardium: There is no evidence of pericardial effusion. Mitral Valve: The mitral valve is degenerative in appearance. There is moderate thickening of the mitral valve leaflet(s). There is moderate calcification of the mitral valve leaflet(s). Severe mitral annular calcification. Trivial mitral valve regurgitation. No evidence of mitral valve stenosis. Tricuspid Valve: The tricuspid valve is normal in structure. Tricuspid valve regurgitation is mild . No evidence of tricuspid stenosis. Aortic Valve: The aortic valve was not well visualized. There is moderate calcification of the aortic valve. There is moderate thickening of the aortic valve. Aortic valve regurgitation is mild. Aortic regurgitation PHT measures 371 msec. Moderate aortic  stenosis is present. Aortic valve mean gradient measures 22.0 mmHg. Aortic valve peak gradient measures 39.2 mmHg. Aortic valve area, by VTI measures 0.54 cm. Pulmonic Valve: The pulmonic valve was normal in structure. Pulmonic valve regurgitation is trivial. No evidence of pulmonic stenosis. Aorta: The aortic root is normal in size and structure. Venous: The inferior vena cava is normal in size with greater than 50% respiratory variability, suggesting right atrial pressure of 3 mmHg. IAS/Shunts: No atrial level shunt detected by color flow Doppler. Additional Comments: 3D was performed not requiring image post processing on an independent workstation and was indeterminate. A device lead is visualized.  LEFT VENTRICLE PLAX 2D LVIDd:         3.40 cm   Diastology LVIDs:         2.40 cm   LV e' medial:    4.13 cm/s LV PW:         1.00 cm   LV E/e' medial:  53.0  LV IVS:        1.10 cm   LV e' lateral:   7.07 cm/s LVOT diam:     1.40 cm   LV E/e' lateral: 31.0 LV SV:         34 LV SV Index:   21 LVOT Area:     1.54 cm  RIGHT VENTRICLE            IVC RV Basal diam:  3.70 cm    IVC diam: 2.00 cm RV S prime:     7.40 cm/s TAPSE (M-mode): 2.0 cm LEFT ATRIUM             Index        RIGHT ATRIUM           Index LA diam:        4.60 cm 2.79 cm/m   RA Area:     13.30 cm LA Vol (A2C):   55.3 ml 33.57 ml/m  RA Volume:   29.60 ml  17.97 ml/m LA Vol (A4C):   46.6 ml 28.29 ml/m LA Biplane Vol: 51.6 ml 31.33 ml/m  AORTIC VALVE AV Area (Vmax):    0.50 cm AV Area (Vmean):   0.49 cm AV Area (VTI):     0.54 cm AV Vmax:           313.00 cm/s AV Vmean:          219.000 cm/s AV VTI:            0.627 m AV Peak Grad:      39.2 mmHg AV Mean Grad:      22.0 mmHg LVOT Vmax:         101.00 cm/s LVOT Vmean:        70.000 cm/s LVOT VTI:          0.220 m LVOT/AV VTI ratio: 0.35 AI PHT:            371 msec  AORTA Ao Root diam: 2.40 cm Ao Asc diam:  2.60 cm MITRAL VALVE MV Area (PHT): 4.12 cm     SHUNTS MV Decel Time: 184 msec     Systemic VTI:  0.22 m MV E velocity: 219.00 cm/s  Systemic Diam: 1.40 cm Charlton Haws MD Electronically signed by Charlton Haws MD Signature Date/Time: 05/16/2023/12:00:31 PM    Final    DG Chest Port 1 View Result Date: 05/16/2023 CLINICAL DATA:  [id number].  Shortness of breath. EXAM: PORTABLE CHEST 1 VIEW COMPARISON:  Portable chest yesterday at 10:44 a.m. FINDINGS: 3:40 a.m. Left chest dual lead pacing system and wire insertions are unchanged. Stable cardiomegaly. There is persistent perihilar vascular congestion and mild interstitial edema with small but increasing pleural effusions. There is increased opacity in the lung bases which could be due to atelectasis or consolidation. The remaining lungs are clear. The mediastinum is stable. Aortic atherosclerosis. No new osseous findings.  Osteopenia.  Thoracic spondylosis. IMPRESSION: 1. Persistent perihilar vascular  congestion and mild interstitial edema with small but increasing pleural effusions. 2. Increased opacity in the lung bases which could be due to atelectasis or consolidation. 3. Stable cardiomegaly. Electronically Signed   By: Almira Bar M.D.   On: 05/16/2023 06:43   US RENAL Result Date: 05/15/2023 CLINICAL DATA:  Acute renal in EXAM: RENAL / URINARY TRACT ULTRASOUND COMPLETE COMPARISON:  None Available. FINDINGS: Right Kidney: Renal measurements: 7.5 x 3.9 x 3.6 cm. = volume: 54.9 mL. Echogenicity within normal limits. No mass or hydronephrosis visualized.  Large upper pole simple cyst is noted measuring 4.2 cm. Left Kidney: Renal measurements: 8.6 x 4.0 x 4.2 cm. = volume: 75.6 mL. Echogenicity within normal limits. No mass or hydronephrosis visualized. Bladder: Appears normal for degree of bladder distention. Other: None. IMPRESSION: Right simple renal cyst.  No follow-up is recommended. Electronically Signed   By: Alcide Clever M.D.   On: 05/15/2023 23:11   DG CHEST PORT 1 VIEW Result Date: 05/15/2023 CLINICAL DATA:  Wheezing. EXAM: PORTABLE CHEST 1 VIEW COMPARISON:  Radiographs 05/14/2023 and 05/13/2023.  CT 05/13/2023. FINDINGS: 1044 hours. Interval extubation and removal of the enteric tube. Left subclavian pacemaker leads appear unchanged, projecting over the right atrium and right ventricle. The heart size and mediastinal contours are stable. However, there are new diffuse ground-glass opacities bilaterally with septal thickening and small left greater than right pleural effusions. No evidence of pneumothorax. The bones appear unchanged. Asymmetric glenohumeral degenerative changes on the right. IMPRESSION: New diffuse ground-glass opacities bilaterally with septal thickening and small left greater than right pleural effusions, most consistent with pulmonary edema/congestive heart failure. Electronically Signed   By: Carey Bullocks M.D.   On: 05/15/2023 14:44   IR CT Head Ltd Result Date:  05/15/2023 INDICATION: New onset right-sided hemiplegia and aphasia. 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 EXAM: 1. EMERGENT LARGE VESSEL OCCLUSION THROMBOLYSIS (anterior CIRCULATION) COMPARISON:  CT angiogram of the head and neck of May 13, 2023. MEDICATIONS: No antibiotic was administered within 1 hour of the procedure. ANESTHESIA/SEDATION: General anesthesia. CONTRAST:  Omnipaque 300 approximately 120 mL. FLUOROSCOPY TIME:  Fluoroscopy Time: 30 minutes 0 seconds (7419 mGy). COMPLICATIONS: None immediate. TECHNIQUE: Following a full explanation of the procedure along with the potential associated complications, an informed witnessed consent was obtained. The risks of intracranial hemorrhage of 10%, worsening neurological deficit, ventilator dependency, death and inability to revascularize were all reviewed in detail with the patient's son. The patient was then put under general anesthesia by the Department of Anesthesiology at [facility]. The right groin was prepped and draped in the usual sterile fashion. Thereafter using modified Seldinger technique, transfemoral access into the right common femoral artery was obtained without difficulty. Over an 0.035 inch guidewire an 8 French 25 cm Pinnacle sheath was inserted. Through this, and also over an 0.035 inch guidewire a combination of a support 088 100 cm Zoom catheter and a 125 cm Simmons 2 support catheter was advanced to the aortic arch region and selectively positioned in the left common carotid artery. Arteriograms were then performed centered extra cranially and intracranially. FINDINGS: Left common carotid arteriogram demonstrates the left external carotid artery and its major branches to be widely patent. The left internal carotid artery at the bulb to the cranial skull base is widely patent. The petrous, the cavernous and the supraclinoid segments demonstrate wide patency. The left anterior cerebral artery  opacifies into the capillary and venous phases. The left middle cerebral artery demonstrates patency of the proximal 2/3 of the M1 segment. The distal 1/3 demonstrates occlusion. PROCEDURE: Through the Zoom 088 100 cm support catheter in the cervical petrous junction, a combination of an 071 132 cm Zoom with an 021 150 cm Phenom microcatheter was advanced over an 018 inch standard micro guidewire with a moderate J configuration to the supraclinoid left ICA. The micro guidewire was then advanced to the distal left M1 segment followed by the microcatheter followed by the 071 Zoom aspiration catheter. The Zoom aspiration catheter was  then engaged into the occluded distal M1 segment. Advancement of the microcatheter through the occluded distal M1 segment into the inferior over the superior divisions was met with significant resistance. The microcatheter and micro guidewire were removed. Constant aspiration was then applied at the hub of the Zoom aspiration catheter for approximately 2 minutes. Thereafter, with proximal aspiration, the combination of the Zoom aspiration catheter was retrieved and removed. A control arteriogram performed through the 088 Zoom support catheter demonstrated improved recanalization of the distal M1 with a few minor opacifying distal to this. A second pass was then made using an 062 134 cm Penumbra aspiration catheter with an 043 distal aspiration over an 018 inch micro guidewire. The microcatheter was again advanced to the mildly recanalized distal M1 segment followed by the microcatheter and the 062 Zoom aspiration catheter which was advanced into the occluded left M1 segment. Advancement of the micro guidewire through the firm clot was met with resistance. The microcatheter and micro guidewire were removed. Constant aspiration was then applied at the hub of the 062 Penumbra aspiration catheter for 2 minutes. A control arteriogram performed through the Zoom support catheter guidewire now  demonstrated partial revascularization of the inferior division. A third pass was then made using the 062 Penumbra aspiration catheter with an 021 150 cm Phenom microcatheter which was advanced over an 018 inch micro guidewire with a moderate J configuration to the distal M1 segment. The micro guidewire was then gently advanced without difficulty through the inferior division of the M2 M3 region followed by the microcatheter. The micro guidewire was removed. Good aspiration obtained from the hub of the microcatheter. A gentle control arteriogram performed through this demonstrates safe positioning of the tip of the microcatheter. A 4 mm x 40 mm Solitaire X retrieval device was then advanced to the distal end of the microcatheter and deployed in the usual manner such that the proximal portion covered the distal M1 segment. The 062 aspiration catheter was now engaged into the origin of the inferior division. A constant aspiration was applied at the hub of the 062 aspiration catheter for approximately 2 minutes. Thereafter the combination of the retrieval device and the aspiration catheter was retrieved and removed. A few more chunks of clot were noted in the aspirate. A control arteriogram performed through the support catheter in the left internal carotid artery now demonstrated revascularization of the left MCA branches achieving a TICI 3 revascularization. Also noted at this time was a small filling defect just distal to the origin of the middle branch of the MCA bifurcation. This also demonstrated moderate spasm at its origin which responded to 4 aliquots of 25 mcg of nitroglycerin intra-arterially. The small filling defect in the proximal aspect of the superior division was then cleared with another pass with the 062 Penumbra aspiration catheter advanced in combination with a 150 cm Phenom micro catheter over an 018 inch micro guidewire. Aspiration was applied right at the face of the filling defect for 30  seconds. A control arteriogram performed following removal of the aspiration catheters demonstrated clearance of the filling defect with mild residual vasospasm which again responded to 25 mcg of nitroglycerin intra-arterially. A TICI 3 revascularization was achieved. Patency was maintained of the left anterior cerebral artery distribution. A final control arteriogram performed through the Zoom support catheter in the distal left common carotid artery demonstrated patency of the left internal carotid artery extra cranially and intracranially. The intracranial vasculature remained widely patent without any filling defects. An 8 [place]  Angio-Seal closure device was deployed for hemostasis at the at the right groin puncture site. Distal pulses remained present in both feet unchanged compared to prior to the procedure. A flat panel CT of the brain demonstrated no evidence of intracranial hemorrhage. The patient was left intubated due to patient's initial neurologic condition. She was then transferred to the neuro ICU for post revascularization care. IMPRESSION: Status post endovascular complete revascularization of occluded distal left middle cerebral artery M1 segment, and the proximal portions of the superior and the inferior divisions with 3 passes of contact aspiration and 1 pass with a 4 mm x 40 mm Solitaire X retrieval device and contact aspiration achieving a TICI 3 revascularization. PLAN: Follow-up as per referring MD. Electronically Signed   By: Julieanne Cotton M.D.   On: 05/15/2023 08:27   IR PERCUTANEOUS ART THROMBECTOMY/INFUSION INTRACRANIAL INC DIAG ANGIO Result Date: 05/15/2023 INDICATION: New onset right-sided hemiplegia and aphasia. 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 EXAM: 1. EMERGENT LARGE VESSEL OCCLUSION THROMBOLYSIS (anterior CIRCULATION) COMPARISON:  CT angiogram of the head and neck of May 13, 2023. MEDICATIONS: No antibiotic was administered within 1  hour of the procedure. ANESTHESIA/SEDATION: General anesthesia. CONTRAST:  Omnipaque 300 approximately 120 mL. FLUOROSCOPY TIME:  Fluoroscopy Time: 30 minutes 0 seconds (7419 mGy). COMPLICATIONS: None immediate. TECHNIQUE: Following a full explanation of the procedure along with the potential associated complications, an informed witnessed consent was obtained. The risks of intracranial hemorrhage of 10%, worsening neurological deficit, ventilator dependency, death and inability to revascularize were all reviewed in detail with the patient's son. The patient was then put under general anesthesia by the Department of Anesthesiology at [facility]. The right groin was prepped and draped in the usual sterile fashion. Thereafter using modified Seldinger technique, transfemoral access into the right common femoral artery was obtained without difficulty. Over an 0.035 inch guidewire an 8 French 25 cm Pinnacle sheath was inserted. Through this, and also over an 0.035 inch guidewire a combination of a support 088 100 cm Zoom catheter and a 125 cm Simmons 2 support catheter was advanced to the aortic arch region and selectively positioned in the left common carotid artery. Arteriograms were then performed centered extra cranially and intracranially. FINDINGS: Left common carotid arteriogram demonstrates the left external carotid artery and its major branches to be widely patent. The left internal carotid artery at the bulb to the cranial skull base is widely patent. The petrous, the cavernous and the supraclinoid segments demonstrate wide patency. The left anterior cerebral artery opacifies into the capillary and venous phases. The left middle cerebral artery demonstrates patency of the proximal 2/3 of the M1 segment. The distal 1/3 demonstrates occlusion. PROCEDURE: Through the Zoom 088 100 cm support catheter in the cervical petrous junction, a combination of an 071 132 cm Zoom with an 021 150 cm Phenom  microcatheter was advanced over an 018 inch standard micro guidewire with a moderate J configuration to the supraclinoid left ICA. The micro guidewire was then advanced to the distal left M1 segment followed by the microcatheter followed by the 071 Zoom aspiration catheter. The Zoom aspiration catheter was then engaged into the occluded distal M1 segment. Advancement of the microcatheter through the occluded distal M1 segment into the inferior over the superior divisions was met with significant resistance. The microcatheter and micro guidewire were removed. Constant aspiration was then applied at the hub of the Zoom aspiration catheter for approximately 2 minutes. Thereafter, with proximal  aspiration, the combination of the Zoom aspiration catheter was retrieved and removed. A control arteriogram performed through the 088 Zoom support catheter demonstrated improved recanalization of the distal M1 with a few minor opacifying distal to this. A second pass was then made using an 062 134 cm Penumbra aspiration catheter with an 043 distal aspiration over an 018 inch micro guidewire. The microcatheter was again advanced to the mildly recanalized distal M1 segment followed by the microcatheter and the 062 Zoom aspiration catheter which was advanced into the occluded left M1 segment. Advancement of the micro guidewire through the firm clot was met with resistance. The microcatheter and micro guidewire were removed. Constant aspiration was then applied at the hub of the 062 Penumbra aspiration catheter for 2 minutes. A control arteriogram performed through the Zoom support catheter guidewire now demonstrated partial revascularization of the inferior division. A third pass was then made using the 062 Penumbra aspiration catheter with an 021 150 cm Phenom microcatheter which was advanced over an 018 inch micro guidewire with a moderate J configuration to the distal M1 segment. The micro guidewire was then gently advanced  without difficulty through the inferior division of the M2 M3 region followed by the microcatheter. The micro guidewire was removed. Good aspiration obtained from the hub of the microcatheter. A gentle control arteriogram performed through this demonstrates safe positioning of the tip of the microcatheter. A 4 mm x 40 mm Solitaire X retrieval device was then advanced to the distal end of the microcatheter and deployed in the usual manner such that the proximal portion covered the distal M1 segment. The 062 aspiration catheter was now engaged into the origin of the inferior division. A constant aspiration was applied at the hub of the 062 aspiration catheter for approximately 2 minutes. Thereafter the combination of the retrieval device and the aspiration catheter was retrieved and removed. A few more chunks of clot were noted in the aspirate. A control arteriogram performed through the support catheter in the left internal carotid artery now demonstrated revascularization of the left MCA branches achieving a TICI 3 revascularization. Also noted at this time was a small filling defect just distal to the origin of the middle branch of the MCA bifurcation. This also demonstrated moderate spasm at its origin which responded to 4 aliquots of 25 mcg of nitroglycerin intra-arterially. The small filling defect in the proximal aspect of the superior division was then cleared with another pass with the 062 Penumbra aspiration catheter advanced in combination with a 150 cm Phenom micro catheter over an 018 inch micro guidewire. Aspiration was applied right at the face of the filling defect for 30 seconds. A control arteriogram performed following removal of the aspiration catheters demonstrated clearance of the filling defect with mild residual vasospasm which again responded to 25 mcg of nitroglycerin intra-arterially. A TICI 3 revascularization was achieved. Patency was maintained of the left anterior cerebral artery  distribution. A final control arteriogram performed through the Zoom support catheter in the distal left common carotid artery demonstrated patency of the left internal carotid artery extra cranially and intracranially. The intracranial vasculature remained widely patent without any filling defects. An 8 French Angio-Seal closure device was deployed for hemostasis at the at the right groin puncture site. Distal pulses remained present in both feet unchanged compared to prior to the procedure. A flat panel CT of the brain demonstrated no evidence of intracranial hemorrhage. The patient was left intubated due to patient's initial neurologic condition. She was then transferred  to the neuro ICU for post revascularization care. IMPRESSION: Status post endovascular complete revascularization of occluded distal left middle cerebral artery M1 segment, and the proximal portions of the superior and the inferior divisions with 3 passes of contact aspiration and 1 pass with a 4 mm x 40 mm Solitaire X retrieval device and contact aspiration achieving a TICI 3 revascularization. PLAN: Follow-up as per referring MD. Electronically Signed   By: Julieanne Cotton M.D.   On: 05/15/2023 08:27   MR BRAIN WO CONTRAST Result Date: 05/14/2023 CLINICAL DATA:  Stroke, follow up EXAM: MRI HEAD WITHOUT CONTRAST MRA HEAD WITHOUT CONTRAST TECHNIQUE: Multiplanar, multi-echo pulse sequences of the brain and surrounding structures were acquired without intravenous contrast. Angiographic images of the Circle of Willis were acquired using MRA technique without intravenous contrast. COMPARISON:  CTA head/neck May 13, 2023. FINDINGS: MRI HEAD FINDINGS Brain: Many acute Infarcts throughout the left MCA territory. Associated edema without substantial mass effect. No midline shift. Remote left PCA territory infarct. Patchy T2/FLAIR hyperintensities the white matter, compatible with chronic microvascular ischemic disease. Remote left cerebellar  infarct. No mass lesion. Cerebral atrophy. Vascular: See below. Skull and upper cervical spine: Normal marrow signal. Sinuses/Orbits: Mild paranasal sinus mucosal thickening. No acute orbital findings. Other: No mastoid effusions. MRA HEAD FINDINGS Anterior circulation: Bilateral intracranial ICAs, MCAs, and ACAs are patent without proximal hemodynamically significant stenosis. Previously seen distal left M1 MCA stenosis versus occlusion now appears patent. Posterior circulation: Bilateral intradural vertebral arteries, basilar artery and bilateral posterior cerebral arteries are patent without proximal hemodynamically significant stenosis. IMPRESSION: 1. Multiple acute infarcts throughout the left MCA territory. 2. Previously seen distal left M1 MCA stenosis versus occlusion now appears patent. No large vessel occlusion or proximal hemodynamically significant stenosis. Electronically Signed   By: Feliberto Harts M.D.   On: 05/14/2023 19:31   MR ANGIO HEAD WO CONTRAST Result Date: 05/14/2023 CLINICAL DATA:  Stroke, follow up EXAM: MRI HEAD WITHOUT CONTRAST MRA HEAD WITHOUT CONTRAST TECHNIQUE: Multiplanar, multi-echo pulse sequences of the brain and surrounding structures were acquired without intravenous contrast. Angiographic images of the Circle of Willis were acquired using MRA technique without intravenous contrast. COMPARISON:  CTA head/neck May 13, 2023. FINDINGS: MRI HEAD FINDINGS Brain: Many acute Infarcts throughout the left MCA territory. Associated edema without substantial mass effect. No midline shift. Remote left PCA territory infarct. Patchy T2/FLAIR hyperintensities the white matter, compatible with chronic microvascular ischemic disease. Remote left cerebellar infarct. No mass lesion. Cerebral atrophy. Vascular: See below. Skull and upper cervical spine: Normal marrow signal. Sinuses/Orbits: Mild paranasal sinus mucosal thickening. No acute orbital findings. Other: No mastoid effusions. MRA  HEAD FINDINGS Anterior circulation: Bilateral intracranial ICAs, MCAs, and ACAs are patent without proximal hemodynamically significant stenosis. Previously seen distal left M1 MCA stenosis versus occlusion now appears patent. Posterior circulation: Bilateral intradural vertebral arteries, basilar artery and bilateral posterior cerebral arteries are patent without proximal hemodynamically significant stenosis. IMPRESSION: 1. Multiple acute infarcts throughout the left MCA territory. 2. Previously seen distal left M1 MCA stenosis versus occlusion now appears patent. No large vessel occlusion or proximal hemodynamically significant stenosis. Electronically Signed   By: Feliberto Harts M.D.   On: 05/14/2023 19:31   DG Abd Portable 1V Result Date: 05/14/2023 CLINICAL DATA:  Check gastric catheter placement EXAM: PORTABLE ABDOMEN - 1 VIEW COMPARISON:  None Available. FINDINGS: Gastric catheter is noted within the stomach. Contrast is noted within the kidneys bilaterally. No free air is seen. IMPRESSION: Gastric catheter in the stomach. Electronically  Signed   By: Alcide Clever M.D.   On: 05/14/2023 02:54   Portable Chest x-ray Result Date: 05/14/2023 CLINICAL DATA:  Status post intubation EXAM: PORTABLE CHEST 1 VIEW COMPARISON:  Film from the previous day. FINDINGS: Cardiac shadow is within normal limits. Pacing device is again seen. Endotracheal tube and gastric catheter are noted in satisfactory position. Lungs are free of acute infiltrate or effusion. IMPRESSION: No active disease. Electronically Signed   By: Alcide Clever M.D.   On: 05/14/2023 02:53   CT ANGIO HEAD NECK W WO CM W PERF Result Date: 05/13/2023 CLINICAL DATA:  Neuro deficit, acute, stroke suspected EXAM: CT ANGIOGRAPHY HEAD AND NECK CT PERFUSION BRAIN TECHNIQUE: Multidetector CT imaging of the head and neck was performed using the standard protocol during bolus administration of intravenous contrast. Multiplanar CT image reconstructions and MIPs  were obtained to evaluate the vascular anatomy. Carotid stenosis measurements (when applicable) are obtained utilizing NASCET criteria, using the distal internal carotid diameter as the denominator. Multiphase CT imaging of the brain was performed following IV bolus contrast injection. Subsequent parametric perfusion maps were calculated using RAPID software. RADIATION DOSE REDUCTION: This exam was performed according to the departmental dose-optimization program which includes automated exposure control, adjustment of the mA and/or kV according to patient size and/or use of iterative reconstruction technique. CONTRAST:  OMNIPAQUE IOHEXOL 350 MG/ML SOLN COMPARISON:  Same day CT head. FINDINGS: CTA NECK FINDINGS Aortic arch: Atherosclerosis.  Great vessel origins are patent. Right carotid system: Atherosclerosis at the carotid bifurcation without greater than 50% stenosis. Also, atherosclerosis at the skull base with approximately 60% stenosis. Left carotid system: Atherosclerosis without greater than 50% stenosis. Vertebral arteries: Left dominant. No significant (greater than 50%) stenosis. Skeleton: No acute abnormality on limited assessment. Other neck: No acute abnormality on limited assessment. Upper chest: Visualized lung apices are clear. Review of the MIP images confirms the above findings CTA HEAD FINDINGS Anterior circulation: Bilateral intracranial ICAs are patent without significant stenosis. Right MCA and bilateral ACAs are patent without proximal hemodynamically significant stenosis. Distal left M1 MCA occlusion versus severe stenosis. Asymmetric opacification of the left MCA vessels more distally. Posterior circulation: Bilateral intradural vertebral arteries, basilar artery and bilateral posterior cerebral arteries are patent without proximal hemodynamically significant stenosis. Venous sinuses: As permitted by contrast timing, patent. Review of the MIP images confirms the above findings CT  Brain Perfusion Findings: CBF (<30%) Volume: 0mL Perfusion (Tmax>6.0s) volume: 50mL Mismatch Volume: 50mL Infarction Location:None identified. IMPRESSION: 1. Occluded versus severely stenotic distal left M1 MCA with asymmetrically diminished distal left MCA opacification. Recommend code stroke activation and emergent neurology consultation. 2. Approximately 50 mL of left MCA penumbra without evidence of core infarct by CT perfusion. 3. Approximately 60% stenosis of the right ICA at the skull base. Findings and recommendations discussed with Dr. Wilkie Aye via telephone at Dr. Wilkie Aye. Electronically Signed   By: Feliberto Harts M.D.   On: 05/13/2023 21:40   DG Shoulder Right Portable Result Date: 05/13/2023 CLINICAL DATA:  Status post trauma. EXAM: RIGHT SHOULDER - 1 VIEW COMPARISON:  None Available. FINDINGS: Limited study secondary to numerous overlying radiopaque cardiac lead wires. There is no evidence of acute fracture or dislocation. Chronic changes are seen along the greater tubercle and inferior medial aspect of the right humeral. There are moderate severity degenerative changes. Soft tissues are unremarkable. IMPRESSION: 1. Limited study without evidence of acute fracture or dislocation. 2. Moderate severity chronic and degenerative changes. Electronically Signed   By: Waylan Rocher  [place] M.D.   On: 05/13/2023 17:46   DG Pelvis Portable Result Date: 05/13/2023 CLINICAL DATA:  Status post trauma. EXAM: PORTABLE PELVIS 1-2 VIEWS COMPARISON:  March 05, 2016 FINDINGS: There is no evidence of pelvic fracture or diastasis. No pelvic bone lesions are seen. IMPRESSION: Negative. Electronically Signed   By: Aram Candela M.D.   On: 05/13/2023 17:45   DG Chest Port 1 View Result Date: 05/13/2023 CLINICAL DATA:  Status post trauma. EXAM: PORTABLE CHEST 1 VIEW COMPARISON:  December 17, 2022 FINDINGS: There is stable dual lead AICD positioning. The heart size and mediastinal contours are within normal limits.  Marked severity calcification of the thoracic aorta is noted. There is no evidence of an acute infiltrate, pleural effusion or pneumothorax. Radiopaque surgical clips are seen overlying the esophageal hiatus. There is mild dextroscoliosis of the mid and lower thoracic spine with multilevel degenerative changes. IMPRESSION: No active cardiopulmonary disease. Electronically Signed   By: Aram Candela M.D.   On: 05/13/2023 17:44   CT CHEST ABDOMEN PELVIS W CONTRAST Result Date: 05/13/2023 CLINICAL DATA:  Found unresponsive, unknown down time EXAM: CT CHEST, ABDOMEN, AND PELVIS WITH CONTRAST CT THORACIC AND LUMBAR SPINE WITH CONTRAST TECHNIQUE: Multidetector CT imaging of the chest, abdomen and pelvis was performed following the standard protocol during bolus administration of intravenous contrast. Multidetector CT imaging of the thoracic and lumbar spine was performed following the standard protocol during bolus administration of intravenous contrast. RADIATION DOSE REDUCTION: This exam was performed according to the departmental dose-optimization program which includes automated exposure control, adjustment of the mA and/or kV according to patient size and/or use of iterative reconstruction technique. CONTRAST:  75mL OMNIPAQUE IOHEXOL 350 MG/ML SOLN COMPARISON:  CT abdomen pelvis, 04/22/2014 FINDINGS: CT CHEST FINDINGS Cardiovascular: Left chest multi lead pacer. Aortic atherosclerosis. Normal heart size. Scattered left and right coronary artery calcifications. No pericardial effusion. Mediastinum/Nodes: No enlarged mediastinal, hilar, or axillary lymph nodes. Thyroid gland, trachea, and esophagus demonstrate no significant findings. Lungs/Pleura: Lungs are clear. No pleural effusion or pneumothorax. Musculoskeletal: No chest wall mass or suspicious osseous lesions identified. CT ABDOMEN PELVIS FINDINGS Hepatobiliary: No focal liver abnormality is seen. Status post cholecystectomy. Similar severe postoperative  biliary ductal dilatation. Pancreas: Unremarkable. No pancreatic ductal dilatation or surrounding inflammatory changes. Spleen: Normal in size without significant abnormality. Adrenals/Urinary Tract: Adrenal glands are unremarkable. Kidneys are normal, without renal calculi, solid lesion, or hydronephrosis. Bladder is unremarkable. Stomach/Bowel: Stomach is within normal limits. Appendix not clearly visualized. No evidence of bowel wall thickening, distention, or inflammatory changes. Vascular/Lymphatic: Aortic atherosclerosis. 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 No enlarged abdominal or pelvic lymph nodes. Reproductive: No mass or other abnormality. Other: No abdominal wall hernia or abnormality. No ascites. Musculoskeletal: No acute osseous findings. CT THORACIC AND LUMBAR SPINE FINDINGS Alignment: Normal thoracic kyphosis. Normal lumbar lordosis. Vertebral bodies: No acute fracture or dislocation. Unchanged mild superior endplate wedge deformity of L2, with approximately 20% height loss. Disc spaces: Generally mild multilevel disc space height loss and osteophytosis throughout the thoracic and lumbar spine, focally moderate at L3-L4. Paraspinous soft tissues: Unremarkable. IMPRESSION: 1. No CT evidence of acute traumatic injury to the chest, abdomen, or pelvis. 2. No acute fracture or dislocation of the thoracic or lumbar spine. Unchanged mild, chronic wedge deformity of L2. 3. Status post cholecystectomy. Similar severe postoperative biliary ductal dilatation. 4. Coronary artery disease. Aortic Atherosclerosis (ICD10-I70.0). Electronically Signed   By: Jearld Lesch  M.D.   On: 05/13/2023 17:28   CT L-SPINE NO CHARGE Result Date: 05/13/2023 CLINICAL DATA:  Found unresponsive, unknown down time EXAM: CT CHEST, ABDOMEN, AND PELVIS WITH CONTRAST CT THORACIC AND LUMBAR SPINE WITH CONTRAST TECHNIQUE: Multidetector CT imaging of the chest,  abdomen and pelvis was performed following the standard protocol during bolus administration of intravenous contrast. Multidetector CT imaging of the thoracic and lumbar spine was performed following the standard protocol during bolus administration of intravenous contrast. RADIATION DOSE REDUCTION: This exam was performed according to the departmental dose-optimization program which includes automated exposure control, adjustment of the mA and/or kV according to patient size and/or use of iterative reconstruction technique. CONTRAST:  75mL OMNIPAQUE IOHEXOL 350 MG/ML SOLN COMPARISON:  CT abdomen pelvis, 04/22/2014 FINDINGS: CT CHEST FINDINGS Cardiovascular: Left chest multi lead pacer. Aortic atherosclerosis. Normal heart size. Scattered left and right coronary artery calcifications. No pericardial effusion. Mediastinum/Nodes: No enlarged mediastinal, hilar, or axillary lymph nodes. Thyroid gland, trachea, and esophagus demonstrate no significant findings. Lungs/Pleura: Lungs are clear. No pleural effusion or pneumothorax. Musculoskeletal: No chest wall mass or suspicious osseous lesions identified. CT ABDOMEN PELVIS FINDINGS Hepatobiliary: No focal liver abnormality is seen. Status post cholecystectomy. Similar severe postoperative biliary ductal dilatation. Pancreas: Unremarkable. No pancreatic ductal dilatation or surrounding inflammatory changes. Spleen: Normal in size without significant abnormality. Adrenals/Urinary Tract: Adrenal glands are unremarkable. Kidneys are normal, without renal calculi, solid lesion, or hydronephrosis. Bladder is unremarkable. Stomach/Bowel: Stomach is within normal limits. Appendix not clearly visualized. No evidence of bowel wall thickening, distention, or inflammatory changes. Vascular/Lymphatic: Aortic atherosclerosis. 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 No enlarged abdominal or pelvic lymph nodes.  Reproductive: No mass or other abnormality. Other: No abdominal wall hernia or abnormality. No ascites. Musculoskeletal: No acute osseous findings. CT THORACIC AND LUMBAR SPINE FINDINGS Alignment: Normal thoracic kyphosis. Normal lumbar lordosis. Vertebral bodies: No acute fracture or dislocation. Unchanged mild superior endplate wedge deformity of L2, with approximately 20% height loss. Disc spaces: Generally mild multilevel disc space height loss and osteophytosis throughout the thoracic and lumbar spine, focally moderate at L3-L4. Paraspinous soft tissues: Unremarkable. IMPRESSION: 1. No CT evidence of acute traumatic injury to the chest, abdomen, or pelvis. 2. No acute fracture or dislocation of the thoracic or lumbar spine. Unchanged mild, chronic wedge deformity of L2. 3. Status post cholecystectomy. Similar severe postoperative biliary ductal dilatation. 4. Coronary artery disease. Aortic Atherosclerosis (ICD10-I70.0). Electronically Signed   By: Jearld Lesch M.D.   On: 05/13/2023 17:28   CT T-SPINE NO CHARGE Result Date: 05/13/2023 CLINICAL DATA:  Found unresponsive, unknown down time EXAM: CT CHEST, ABDOMEN, AND PELVIS WITH CONTRAST CT THORACIC AND LUMBAR SPINE WITH CONTRAST TECHNIQUE: Multidetector CT imaging of the chest, abdomen and pelvis was performed following the standard protocol during bolus administration of intravenous contrast. Multidetector CT imaging of the thoracic and lumbar spine was performed following the standard protocol during bolus administration of intravenous contrast. RADIATION DOSE REDUCTION: This exam was performed according to the departmental dose-optimization program which includes automated exposure control, adjustment of the mA and/or kV according to patient size and/or use of iterative reconstruction technique. CONTRAST:  75mL OMNIPAQUE IOHEXOL 350 MG/ML SOLN COMPARISON:  CT abdomen pelvis, 04/22/2014 FINDINGS: CT CHEST FINDINGS Cardiovascular: Left chest multi lead  pacer. Aortic atherosclerosis. Normal heart size. Scattered left and right coronary artery calcifications. No pericardial effusion. Mediastinum/Nodes: No enlarged mediastinal, hilar, or axillary lymph nodes. Thyroid gland, trachea,  and esophagus demonstrate no significant findings. Lungs/Pleura: Lungs are clear. No pleural effusion or pneumothorax. Musculoskeletal: No chest wall mass or suspicious osseous lesions identified. CT ABDOMEN PELVIS FINDINGS Hepatobiliary: No focal liver abnormality is seen. Status post cholecystectomy. Similar severe postoperative biliary ductal dilatation. Pancreas: Unremarkable. No pancreatic ductal dilatation or surrounding inflammatory changes. Spleen: Normal in size without significant abnormality. Adrenals/Urinary Tract: Adrenal glands are unremarkable. Kidneys are normal, without renal calculi, solid lesion, or hydronephrosis. Bladder is unremarkable. Stomach/Bowel: Stomach is within normal limits. Appendix not clearly visualized. No evidence of bowel wall thickening, distention, or inflammatory changes. Vascular/Lymphatic: Aortic atherosclerosis. 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 No enlarged abdominal or pelvic lymph nodes. Reproductive: No mass or other abnormality. Other: No abdominal wall hernia or abnormality. No ascites. Musculoskeletal: No acute osseous findings. CT THORACIC AND LUMBAR SPINE FINDINGS Alignment: Normal thoracic kyphosis. Normal lumbar lordosis. Vertebral bodies: No acute fracture or dislocation. Unchanged mild superior endplate wedge deformity of L2, with approximately 20% height loss. Disc spaces: Generally mild multilevel disc space height loss and osteophytosis throughout the thoracic and lumbar spine, focally moderate at L3-L4. Paraspinous soft tissues: Unremarkable. IMPRESSION: 1. No CT evidence of acute traumatic injury to the chest, abdomen, or pelvis. 2. No acute fracture or  dislocation of the thoracic or lumbar spine. Unchanged mild, chronic wedge deformity of L2. 3. Status post cholecystectomy. Similar severe postoperative biliary ductal dilatation. 4. Coronary artery disease. Aortic Atherosclerosis (ICD10-I70.0). Electronically Signed   By: Jearld Lesch M.D.   On: 05/13/2023 17:28   CT HEAD WO CONTRAST Result Date: 05/13/2023 CLINICAL DATA:  Found unresponsive, unknown downtime EXAM: CT HEAD WITHOUT CONTRAST CT MAXILLOFACIAL WITHOUT CONTRAST CT CERVICAL SPINE WITHOUT CONTRAST TECHNIQUE: Multidetector CT imaging of the head, cervical spine, and maxillofacial structures were performed using the standard protocol without intravenous contrast. Multiplanar CT image reconstructions of the cervical spine and maxillofacial structures were also generated. RADIATION DOSE REDUCTION: This exam was performed according to the departmental dose-optimization program which includes automated exposure control, adjustment of the mA and/or kV according to patient size and/or use of iterative reconstruction technique. COMPARISON:  04/10/2023 FINDINGS: CT HEAD FINDINGS Brain: No evidence of acute infarction, hemorrhage, hydrocephalus, extra-axial collection or mass lesion/mass effect. Mild periventricular and deep white matter hypodensity. Unchanged left occipital encephalomalacia. Vascular: No hyperdense vessel or unexpected calcification. CT FACIAL BONES FINDINGS Skull: Normal. Negative for fracture or focal lesion. Facial bones: No displaced fractures or dislocations. Sinuses/Orbits: Small air-fluid level in the right maxillary sinus. No overlying fracture. Other: Patient is partially edentulous with erosion of the left maxillary alveolar bone into the left maxillary sinus (series 7, image 31). CT CERVICAL SPINE FINDINGS Alignment: Normal. Skull base and vertebrae: No acute fracture. No primary bone lesion or focal pathologic process. Soft tissues and spinal canal: No prevertebral fluid or swelling.  No visible canal hematoma. Disc levels: Focally moderate disc space height loss and osteophytosis of the lower cervical levels C5-C7 Upper chest: Negative. Other: None. IMPRESSION: 1. No acute intracranial pathology. Small-vessel white matter disease and unchanged left occipital encephalomalacia. 2. No displaced fractures or dislocations of the facial bones. 3. Small air-fluid level in the right maxillary sinus. No overlying fracture. Patient is partially edentulous with erosion of the left maxillary alveolar bone into the left maxillary sinus. Correlate for sinusitis. 4. No fracture or static subluxation of the cervical spine. 5. Focally moderate disc space height loss and osteophytosis of the lower cervical  levels. Electronically Signed   By: Jearld Lesch M.D.   On: 05/13/2023 17:20   CT CERVICAL SPINE WO CONTRAST Result Date: 05/13/2023 CLINICAL DATA:  Found unresponsive, unknown downtime EXAM: CT HEAD WITHOUT CONTRAST CT MAXILLOFACIAL WITHOUT CONTRAST CT CERVICAL SPINE WITHOUT CONTRAST TECHNIQUE: Multidetector CT imaging of the head, cervical spine, and maxillofacial structures were performed using the standard protocol without intravenous contrast. Multiplanar CT image reconstructions of the cervical spine and maxillofacial structures were also generated. RADIATION DOSE REDUCTION: This exam was performed according to the departmental dose-optimization program which includes automated exposure control, adjustment of the mA and/or kV according to patient size and/or use of iterative reconstruction technique. COMPARISON:  04/10/2023 FINDINGS: CT HEAD FINDINGS Brain: No evidence of acute infarction, hemorrhage, hydrocephalus, extra-axial collection or mass lesion/mass effect. Mild periventricular and deep white matter hypodensity. Unchanged left occipital encephalomalacia. Vascular: No hyperdense vessel or unexpected calcification. CT FACIAL BONES FINDINGS Skull: Normal. Negative for fracture or focal lesion.  Facial bones: No displaced fractures or dislocations. Sinuses/Orbits: Small air-fluid level in the right maxillary sinus. No overlying fracture. Other: Patient is partially edentulous with erosion of the left maxillary alveolar bone into the left maxillary sinus (series 7, image 31). CT CERVICAL SPINE FINDINGS Alignment: Normal. Skull base and vertebrae: No acute fracture. No primary bone lesion or focal pathologic process. Soft tissues and spinal canal: No prevertebral fluid or swelling. No visible canal hematoma. Disc levels: Focally moderate disc space height loss and osteophytosis of the lower cervical levels C5-C7 Upper chest: Negative. Other: None. IMPRESSION: 1. No acute intracranial pathology. Small-vessel white matter disease and unchanged left occipital encephalomalacia. 2. No displaced fractures or dislocations of the facial bones. 3. Small air-fluid level in the right maxillary sinus. No overlying fracture. Patient is partially edentulous with erosion of the left maxillary alveolar bone into the left maxillary sinus. Correlate for sinusitis. 4. No fracture or static subluxation of the cervical spine. 5. Focally moderate disc space height loss and osteophytosis of the lower cervical levels. Electronically Signed   By: Jearld Lesch M.D.   On: 05/13/2023 17:20   CT MAXILLOFACIAL WO CONTRAST Result Date: 05/13/2023 CLINICAL DATA:  Found unresponsive, unknown downtime EXAM: CT HEAD WITHOUT CONTRAST CT MAXILLOFACIAL WITHOUT CONTRAST CT CERVICAL SPINE WITHOUT CONTRAST TECHNIQUE: Multidetector CT imaging of the head, cervical spine, and maxillofacial structures were performed using the standard protocol without intravenous contrast. Multiplanar CT image reconstructions of the cervical spine and maxillofacial structures were also generated. RADIATION DOSE REDUCTION: This exam was performed according to the departmental dose-optimization program which includes automated exposure control, adjustment of the mA  and/or kV according to patient size and/or use of iterative reconstruction technique. COMPARISON:  04/10/2023 FINDINGS: CT HEAD FINDINGS Brain: No evidence of acute infarction, hemorrhage, hydrocephalus, extra-axial collection or mass lesion/mass effect. Mild periventricular and deep white matter hypodensity. Unchanged left occipital encephalomalacia. Vascular: No hyperdense vessel or unexpected calcification. CT FACIAL BONES FINDINGS Skull: Normal. Negative for fracture or focal lesion. Facial bones: No displaced fractures or dislocations. Sinuses/Orbits: Small air-fluid level in the right maxillary sinus. No overlying fracture. Other: Patient is partially edentulous with erosion of the left maxillary alveolar bone into the left maxillary sinus (series 7, image 31). CT CERVICAL SPINE FINDINGS Alignment: Normal. Skull base and vertebrae: No acute fracture. No primary bone lesion or focal pathologic process. Soft tissues and spinal canal: No prevertebral fluid or swelling. No visible canal hematoma. Disc levels: Focally moderate disc space height loss and osteophytosis of the lower cervical  levels C5-C7 Upper chest: Negative. Other: None. IMPRESSION: 1. No acute intracranial pathology. Small-vessel white matter disease and unchanged left occipital encephalomalacia. 2. No displaced fractures or dislocations of the facial bones. 3. Small air-fluid level in the right maxillary sinus. No overlying fracture. Patient is partially edentulous with erosion of the left maxillary alveolar bone into the left maxillary sinus. Correlate for sinusitis. 4. No fracture or static subluxation of the cervical spine. 5. Focally moderate disc space height loss and osteophytosis of the lower cervical levels. Electronically Signed   By: Jearld Lesch M.D.   On: 05/13/2023 17:20     PHYSICAL EXAM  Temp:  [98.6 F (37 C)-99.8 F (37.7 C)] 99.4 F (37.4 C) (03/08 1155) Pulse Rate:  [59-84] 67 (03/08 1300) Resp:  [16-33] 24 (03/08  1300) BP: (130-168)/(41-133) 145/89 (03/08 1300) SpO2:  [88 %-96 %] 94 % (03/08 1300) Weight:  [62.5 kg] 62.5 kg (03/08 0419)  General -frail elderly Caucasian lady,  in mild respiratory distress  Ophthalmologic - fundi not visualized due to noncooperation.  Cardiovascular - irregularly irregular heart rate and rhythm.  Neuro - sitting up in bed awake, alert, eyes open, global aphasia, nonverbal and not following commands except closed eyes on request. Left gaze preference but able to cross midline, tracking more on the left, blinking to visual threat on the left but not consistent on the right. R facial droop with increased right palpebral fissure. Tongue protrusion not cooperative. LUE at least 3/5, RUE flaccid but withdraw to pain. Bilaterally LEs proximal 3/5 and distal LLE 3/5 and RLE 3-/5. Sensation, coordination and gait not tested.    ASSESSMENT/PLAN [name] is a 89 y.o. female with history of HFpEF, pacemaker, A-fib on Xarelto, HLD, HTN, CAD, PVD admitted for right side weakness, aphasia, found down at home. No TNK given due to on Xarelto.    Stroke:  left MCA scattered infarcts embolic secondary to A-fib even on Xarelto CT no acute abnormality, left occipital encephalomalacia CT head and neck left M1 occlusion.  60% stenosis left ICA at the skull base CTP 0/50 cc Status post IR left M1 occlusion with TICI3 MRI multifocal acute infarcts throughout left MCA territory MRA distal left M1 MCA stenosis versus occlusion now patent 2D Echo ejection fraction 60 to 65%.  LA enlarged 4.6 cm LDL 67 HgbA1c 5.4 Lovenox for VTE prophylaxis Xarelto (rivaroxaban) daily prior to admission, now on aspirin 300 mg suppository daily, will switch to heparin IV. Switch to eliquis once renal function much improved. Ongoing aggressive stroke risk factor management Therapy recommendations: SNF Disposition: Pending  A-fib RVR Cardiology on board on diltiazem IV  Now off amiodarone IV On  metoprolol IV 2.5 mg every 6 hours Rate controlled Will start heparin IV today.   Respiratory failure, resolved CHF Extubated 05/14/2023, tolerating well Developed AKI and fluid overload Now on lasix Cardiology and nephrology on board Management per primary team  Hypertension Stable Off Cleviprex BP goal less than 180/105 Long term BP goal normotensive  Hyperlipidemia Home meds: None LDL 67, goal < 70 Statin intolerance No statin needed given LDL at goal  AKI Creatinine 1.24--1.10--1.19--2.34--3.61 Off IV fluid now On lasix Nephrology on board Management per primary team  Dysphagia Did not pass swallow On n.p.o. Cortrak placed Tube feeding initiation per primary team and nephrology   Other Stroke Risk Factors Advanced age HFpEF CAD  Other Active Problems Pacemaker in place PUD Slight leukocytosis, WBC 10.6--8.9--8.8  Hospital day # 4  Patient remains  globally aphasic without significant improvement in neurological exam however medical issues continue to worsen with increase oxygen requirements, breathing difficulties as well as worsening renal function.  Long discussion at bedside with patient's son and daughter regarding her overall prognosis being quite poor and she will likely need to return to: Nursing care in the skilled nursing setting and the best case scenario evaluation to survives and overcomes her worsening medical issues.  Family is realistic and willing to think about goals of care.  Recommend palliative care consult.  Will discuss with primary team. Greater than 50% time during this 50-minute visit was spent in counseling and coordination of care about her stroke, aphasia as well as renal failure and respiratory failure discussion patient and care team answering questions   Delia Heady  , MD Stroke Neurology 05/17/2023 1:22 PM    To contact Stroke Continuity provider, please refer to [email or web address]. After hours, contact General Neurology

## 2023-05-17 NOTE — Plan of Care (Signed)
  Problem: Ischemic Stroke/TIA Tissue Perfusion: Goal: Complications of ischemic stroke/TIA will be minimized Outcome: Not Progressing   Problem: Coping: Goal: Will verbalize positive feelings about self Outcome: Not Progressing Goal: Will identify appropriate support needs Outcome: Not Progressing   Problem: Education: Goal: Knowledge of disease or condition will improve Outcome: Not Progressing Goal: Knowledge of secondary prevention will improve (MUST DOCUMENT ALL) Outcome: Not Progressing Goal: Knowledge of patient specific risk factors will improve (DELETE if not current risk factor) Outcome: Not Progressing   Problem: Health Behavior/Discharge Planning: Goal: Ability to manage health-related needs will improve Outcome: Not Progressing Goal: Goals will be collaboratively established with patient/family Outcome: Not Progressing   Problem: Self-Care: Goal: Ability to participate in self-care as condition permits will improve Outcome: Not Progressing Goal: Verbalization of feelings and concerns over difficulty with self-care will improve Outcome: Not Progressing Goal: Ability to communicate needs accurately will improve Outcome: Not Progressing   Problem: Nutrition: Goal: Risk of aspiration will decrease Outcome: Not Progressing Goal: Dietary intake will improve Outcome: Not Progressing   Problem: Education: Goal: Knowledge of General Education information will improve Description: Including pain rating scale, medication(s)/side effects and non-pharmacologic comfort measures Outcome: Not Progressing   Problem: Health Behavior/Discharge Planning: Goal: Ability to manage health-related needs will improve Outcome: Not Progressing   Problem: Clinical Measurements: Goal: Ability to maintain clinical measurements within normal limits will improve Outcome: Not Progressing Goal: Will remain free from infection Outcome: Not Progressing Goal: Diagnostic test results will  improve Outcome: Not Progressing Goal: Respiratory complications will improve Outcome: Not Progressing Goal: Cardiovascular complication will be avoided Outcome: Not Progressing   Problem: Activity: Goal: Risk for activity intolerance will decrease Outcome: Not Progressing   Problem: Nutrition: Goal: Adequate nutrition will be maintained Outcome: Not Progressing   Problem: Coping: Goal: Level of anxiety will decrease Outcome: Not Progressing   Problem: Elimination: Goal: Will not experience complications related to bowel motility Outcome: Not Progressing Goal: Will not experience complications related to urinary retention Outcome: Not Progressing   Problem: Pain Managment: Goal: General experience of comfort will improve and/or be controlled Outcome: Not Progressing   Problem: Safety: Goal: Ability to remain free from injury will improve Outcome: Not Progressing   Problem: Skin Integrity: Goal: Risk for impaired skin integrity will decrease Outcome: Not Progressing   Problem: Education: Goal: Understanding of CV disease, CV risk reduction, and recovery process will improve Outcome: Not Progressing Goal: Individualized Educational Video(s) Outcome: Not Progressing   Problem: Activity: Goal: Ability to return to baseline activity level will improve Outcome: Not Progressing   Problem: Cardiovascular: Goal: Ability to achieve and maintain adequate cardiovascular perfusion will improve Outcome: Progressing Goal: Vascular access site(s) Level 0-1 will be maintained Outcome: Completed/Met   Problem: Health Behavior/Discharge Planning: Goal: Ability to safely manage health-related needs after discharge will improve Outcome: Not Progressing   Problem: Activity: Goal: Ability to tolerate increased activity will improve Outcome: Not Progressing   Problem: Respiratory: Goal: Ability to maintain a clear airway and adequate ventilation will improve Outcome: Not  Progressing   Problem: Role Relationship: Goal: Method of communication will improve Outcome: Not Progressing

## 2023-05-17 NOTE — Progress Notes (Addendum)
 0800 patient alert unable to follow commands unable to communicate needs. Patient on tube feeding at goal patient on 10L [place] HF oxygen that was increased during the night, patient wheezing with bilateral rhonchi very restless IV on right not in arm due to right arm swelling not replaced. Patient will not allow oral care 1200 granddaughter tried to help with oral care patient will not allow it 1515 patient still wont allow oral care, patient now following commands attempted to allow patient to do own oral care still unsuccessful  1700 son at bedside unable to help me get patient to open mouth for me to do any oral care patient thrashing head like all other times will not open mouth patient son reports that its been this way for days she wont allow oral care

## 2023-05-17 NOTE — Progress Notes (Signed)
   Patient Name: Ruth Roth Date of Encounter: 05/17/2023 [facility] Cardiologist: Lorine Bears, MD   Interval Summary  .    Aphasic some tachypnea getting breathing treatment  Vital Signs .    Vitals:   05/17/23 0806 05/17/23 0830 05/17/23 0857 05/17/23 0900  BP: (!) 166/89 (!) 153/80  (!) 153/65  Pulse:  60  60  Resp: 16 (!) 23  (!) 27  Temp:      TempSrc:      SpO2:  93% 94% 95%  Weight:      Height:        Intake/Output Summary (Last 24 hours) at 05/17/2023 0949 Last data filed at 05/17/2023 0800 Gross per 24 hour  Intake 987.94 ml  Output 790 ml  Net 197.94 ml      05/17/2023    4:19 AM 05/14/2023    1:28 AM 05/14/2023    1:00 AM  Last 3 Weights  Weight (lbs) 137 lb 12.6 oz 137 lb 2 oz 133 lb 13.1 oz  Weight (kg) 62.5 kg 62.2 kg 60.7 kg      Telemetry/ECG    Atrial flutter with mostly 4: 1 AV block, infrequent ventricular pacing- Personally Reviewed  Physical Exam .    Elderly female Aphasic Tachypnea LE varicosities plus one edema  No murmur PPM under left clavicle  Abdomen benign prior Bilroth 2  Assessment & Plan .     Atrial Flutter:  rates controlled on heparin EF 60-65% by TTE  chronic oral cardizem per tube feeds  PPM:  normal function St jude device VVI lower rate 60 bpm A/CRF:  limited urine output ? ATN being down at home CR upt to 4.49 with limited urine output Nurse indicates no dialysis  Stroke:  post IR thrombolysis occluded left MCA continue heparin   Consider palliative care consult     Signed, Charlton Haws, MD

## 2023-05-17 NOTE — Progress Notes (Signed)
 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  She missed her doctors appointment on 3 4 and a wellness check was performed and they did break down her door to access her home.  She is found lying on the ground with multiple bruises and transferred to the ED as a level 2 trauma.  Upon arrival to the ED she was noted to have rapid atrial fibrillation as well as evidence of UTI with elevation in CK.  Trauma workup was unremarkable and cardiology is consulted for atrial fibrillation flutter.  She started on diltiazem infusion and was placed on IV amiodarone as well as beta-blocker.  She was placed on IV heparin and there is concern for stroke provoking her fall neurology was consulted.  CTA of the head and neck demonstrated occluded versus a severely stenotic distal left M1 MCA with 50 mL of left MCA penumbra.  Options were considered and she ultimately underwent interventional radiology for mechanical thrombectomy and ATICI 3 revascularization result was achieved.  Post procedurally she was transferred to the neuro ICU and PCCM was consulted given that she remained on the ventilator.    She was then subsequently extubated and sedation was weaned and transferred to the [facility] service on 05/15/2023.  She currently continues to have global aphasia and now has a worsening AKI so nephrology has been consulted and workup for AKI has been done. Cortrak is place now and Meds are being adjusted.   Assessment and Plan:  Acute left MCA scattered  infarcts which is likely embolic to an A-fib event even on anticoagulation status post revascularization TICI 3 -She is status post IR left M1 occlusion with a tiki 3 and MRI showed multifocal acute infarcts throughout the left MCA territory.  MRI revealed distal M1 MCA stenosis versus occlusion which is now patent Echocardiogram has been done and neurology now recommending aspirin 300 mg daily suppository and Eliquis once p.o. access established.  Plan is for small bore core track in the a.m. further care per neurology and PT OT currently are recommending SNF.  Further stroke team recommendations appreciated and recommending systolic goal blood pressure being 03/31/1936 and maintain neuroprotective measures with goal for euthymia, euglycemia, natremia normoxia and appears CO2 of 35-40 -Neurology has now started her on IV Heparin Drip -Palliative care consulted for goals of care discussion given worsening   A flutter/Atrial Fibrillation with RVR/CAD/permanent pacemaker:  -IV therapies with IV diltiazem and IV metoprolol changed to orals and she is now on diltiazem 60 mg per tube Q8 as well as metoprolol to tartrate 50 mg per tube.  Will switch from Xarelto to Eliquis when she is able to be given enteral anticoagulant since and that she may have failed Xarelto but her worsening renal function this we will start her on IV heparin for now. Continue to monitor on telemetry and further care per Cardiology  Acute on Chronic diastolic heart failure with preserved ejection fraction -BNP went from 673.8 -> 1,268.9 -Continue to monitor for signs and symptoms 1 we will give now that we are rehydrating her given her AKI -  Strict I's and O's and daily weights is +5 0.898 L since admission.  Getting IV diuresis now and nephrology consulted for further assistance  AKI on CKD Stage 3b, worsening further Metabolic acidosis Hypernatremia -BUN/Cr Trend: Recent Labs  Lab 05/13/23 1518 05/13/23 1526 05/14/23 0455  05/15/23 0538 05/16/23 0501 05/17/23 0539  BUN 29* 31* 31* 35* 45* 64*  CREATININE 1.24* 1.10* 1.19* 2.34* 3.61* 4.49*  -Check urinalysis, urine sodium, urine creatinine, urine osmolality and renal ultrasound as well as bladder scans; was given IV fluid hydration but this is stopped given her volume overload and now getting diuresis. -Patient also has a metabolic acidosis now with a CO2 of 18, anion gap of 13, chloride level 116 -Avoid Nephrotoxic Medications, Contrast Dyes, Hypotension and Dehydration to Ensure Adequate Renal Perfusion and will need to Renally Adjust Meds -Continue to Monitor and Trend Renal Function carefully and repeat CMP in the AM  -Nephrology consulted for further evaluation and given her progressive hypoxia they have increased her Lasix dosing to 160 mg twice daily.  They do not feel any strong indication for RRT and feel that she is a very poor candidate and recommended against dialysis. -Palliative care has been consulted for further goals of care discussion given her worsening renal function and poor prognosis  Rhabdomyolysis -Improving.  CK has gone from 1135 -> 472 -> 311 and is now 327 on last check.  Discontinued IVF as above and Continue to Monitor and Trend and repeat CMP in the AM  Normocytic Anemia: Hemoglobin/hematocrit is now 10.9/34.2.  Check anemia panel in AM.  Continue to monitor for signs of bleeding; no overt bleeding noted but she was FOBT positive on admission.  Repeat CBC in a.m.  Thrombocytopenia: Platelet Count Dropped to 138 and is now 164 today. CTM for S/Sx of Bleeding and repeat CBC in the AM and C  Sepsis Secondary to GNR UTI -Continue with IV ceftriaxone given that she is nitrate positive.  Stop date is in place and today is her last day of antibiotics.  Leukocytosis has resolved and is 9.4 and urinalysis is improving  Acute Respiratory Failure with Hypoxia, worsening -Was vented postoperatively and was weaned and now started on Xopenex  and Atrovent.SpO2: 92 %; O2 Flow Rate (L/min): 10 L/min FiO2 (%): 30 % Wearing supplemental oxygen via nasal cannula and had to be given IV Lasix given her volume overload she continues to retain fluid given her worsening kidney functions -Continue breathing treatments per eval, incentive spirometer and guaifenesin  Hypothyroidism -TSH is elevated at 8.301 but free T4 is within normal limits at 0.65; will resume home Synthroid once enteral access has been initiated  Hypoalbuminemia: Patient's Albumin Trend ranginf rom 3.2-3.8; Was 3.3 today. CTM and repeat CMP in the AM   DVT prophylaxis: Place and maintain sequential compression device Start: 05/14/23 0151 SCDs Start: 05/13/23 2145 Place TED hose Start: 05/13/23 2145    Code Status: Full Code Family Communication: No family present at bedside  Disposition Plan:  Level of care: ICU Status is: Inpatient Remains inpatient appropriate because: Needs further clinical improvement    Consultants:  Nephrology Neurology Cardiology Palliative Care Medicine PCCM Transfer  Procedures:  As delineated as above  Antimicrobials:  Anti-infectives (From admission, onward)    Start     Dose/Rate Route Frequency Ordered Stop   05/14/23 0800  cefTRIAXone (ROCEPHIN) 1 g in sodium chloride 0.9 % 100 mL IVPB        1 g 200 mL/hr over 30  Minutes Intravenous Every 24 hours 05/13/23 2138 05/15/23 0826   05/13/23 2000  cefTRIAXone (ROCEPHIN) 1 g in sodium chloride 0.9 % 100 mL IVPB        1 g 200 mL/hr over 30 Minutes Intravenous  Once 05/13/23 1950 05/13/23 2055       Subjective: Seen and examined at bedside and respiratory status worsened overnight and she does shake head yes and no but unable to verbalize.  Discussed with her that her kidney function was worsening and that she is not a dialysis candidate and she agreed.  No other concerns or complaints at this time.  Objective: Vitals:   05/17/23 1506 05/17/23 1600 05/17/23 1700 05/17/23  1800  BP: (!) 162/62 (!) 158/61 (!) 128/50 (!) 147/59  Pulse:  60 60 64  Resp:  (!) 26 (!) 25 (!) 27  Temp:  98.7 F (37.1 C)    TempSrc:  Axillary    SpO2:  95% 95% 92%  Weight:      Height:        Intake/Output Summary (Last 24 hours) at 05/17/2023 1949 Last data filed at 05/17/2023 1821 Gross per 24 hour  Intake 1458.31 ml  Output 1050 ml  Net 408.31 ml   Filed Weights   05/14/23 0100 05/14/23 0128 05/17/23 0419  Weight: 60.7 kg 62.2 kg 62.5 kg   Examination: Physical Exam:  Constitutional: Elderly Caucasian female who is little bit more dyspneic today compared to yesterday and does interact and follows some commands Respiratory: Diminished to auscultation bilaterally some coarse breath sounds and does have some rhonchi and crackles as well with some wheezing but no appreciable rales.  Wearing supplemental oxygen via nasal cannula is back up to 10 L Cardiovascular: Irregularly irregular and, no murmurs / rubs / gallops. S1 and S2 auscultated.  Minimal extremity edema Abdomen: Soft, non-tender, nondistended. Bowel sounds positive.  GU: Deferred. Musculoskeletal: No clubbing / cyanosis of digits/nails. No joint deformity upper and lower extremities.  Skin: No rashes, lesions, ulcers pulmonary skin evaluation. No induration; Warm and dry.  Neurologic: She is awake and alert and aphasic and follows some commands.  Has a right facial droop Psychiatric: Awake and alert  Data Reviewed: I have personally reviewed following labs and imaging studies  CBC: Recent Labs  Lab 05/13/23 1518 05/13/23 1526 05/14/23 0155 05/14/23 0455 05/15/23 0538 05/16/23 0501 05/17/23 0539  WBC 14.0*  --   --  10.6* 8.9 8.8 9.4  NEUTROABS  --   --   --  8.9*  --  7.4 8.0*  HGB 15.6*   < > 13.6 13.1 10.7* 10.3* 10.9*  HCT 49.2*   < > 40.0 41.4 34.2* 33.2* 34.2*  MCV 98.0  --   --  97.6 99.7 100.0 96.9  PLT 206  --   --  170 155 138* 164   < > = values in this interval not displayed.   Basic  Metabolic Panel: Recent Labs  Lab 05/13/23 1518 05/13/23 1526 05/13/23 1857 05/14/23 0155 05/14/23 0455 05/15/23 0538 05/16/23 0501 05/17/23 0539 05/17/23 1725  NA 141 143   < > 140 142 140 143 147*  --   K 4.1 4.1   < > 3.3* 3.7 4.1 3.8 4.1  --   CL 109 113*  --   --  108 112* 114* 116*  --   CO2 17*  --   --   --  19* 15* 14* 18*  --   GLUCOSE 138* 135*  --   --  145* 91 89 135*  --   BUN 29* 31*  --   --  31* 35* 45* 64*  --   CREATININE 1.24* 1.10*  --   --  1.19* 2.34* 3.61* 4.49*  --   CALCIUM 9.5  --   --   --  9.0 8.6* 8.6* 9.3  --   MG 2.0  --   --   --   --   --  2.0 2.2 2.3  PHOS  --   --   --   --   --   --  6.0* 6.6* 6.1*   < > = values in this interval not displayed.   GFR: Estimated Creatinine Clearance: 6.6 mL/min (A) (by C-G formula based on SCr of 4.49 mg/dL (H)). Liver Function Tests: Recent Labs  Lab 05/13/23 1518 05/14/23 0455 05/15/23 0538 05/16/23 0501 05/17/23 0539  AST 74* 43* 33 28 28  ALT 50* 41 37 37 39  ALKPHOS 98 79 70 73 82  BILITOT 1.3* 0.7 0.6 0.6 0.8  PROT 6.8 5.8* 5.7* 5.4* 6.2*  ALBUMIN 3.8 3.2* 3.5 3.2* 3.3*   No results for input(s): "LIPASE", "AMYLASE" in the last 168 hours. Recent Labs  Lab 05/13/23 1835  AMMONIA 17   Coagulation Profile: Recent Labs  Lab 05/13/23 1518 05/14/23 0455  INR 1.2 1.2   Cardiac Enzymes: Recent Labs  Lab 05/13/23 1518 05/14/23 0455 05/15/23 1244 05/16/23 0501  CKTOTAL 1,135* 472* 311* 327*   BNP (last 3 results) No results for input(s): "PROBNP" in the last 8760 hours. HbA1C: No results for input(s): "HGBA1C" in the last 72 hours. CBG: Recent Labs  Lab 05/16/23 2357 05/17/23 0346 05/17/23 0742 05/17/23 1142 05/17/23 1513  GLUCAP 154* 172* 159* 139* 171*   Lipid Profile: No results for input(s): "CHOL", "HDL", "LDLCALC", "TRIG", "CHOLHDL", "LDLDIRECT" in the last 72 hours. Thyroid Function Tests: No results for input(s): "TSH", "T4TOTAL", "FREET4", "T3FREE", "THYROIDAB" in  the last 72 hours. Anemia Panel: No results for input(s): "VITAMINB12", "FOLATE", "FERRITIN", "TIBC", "IRON", "RETICCTPCT" in the last 72 hours. Sepsis Labs: Recent Labs  Lab 05/13/23 1526 05/14/23 0455 05/15/23 1015 05/15/23 1244  LATICACIDVEN 3.2* 2.2* 1.4 1.6    Recent Results (from the past 240 hours)  MRSA Next Gen by PCR, Nasal     Status: Abnormal   Collection Time: 05/14/23 12:59 AM   Specimen: Nasal Mucosa; Nasal Swab  Result Value Ref Range Status   MRSA by PCR Next Gen DETECTED (A) NOT DETECTED Final    Comment: RESULT CALLED TO, READ BACK BY AND VERIFIED WITH: K JONES,RN@[id number]  05/14/23 MK (NOTE) The GeneXpert MRSA Assay (FDA approved for NASAL specimens only), is one component of a comprehensive MRSA colonization surveillance program. It is not intended to diagnose MRSA infection nor to guide or monitor treatment for MRSA infections. Test performance is not FDA approved in patients less than 60 years old. Performed at [facility], [address], [place], [place] [address]   Culture, blood (Routine X 2) w Reflex to ID Panel     Status: None (Preliminary result)   Collection Time: 05/14/23  4:42 AM   Specimen: BLOOD  Result Value Ref Range Status   Specimen Description BLOOD SITE NOT SPECIFIED  Final   Special Requests   Final    AEROBIC BOTTLE ONLY Blood Culture results may not be optimal due to an inadequate volume of blood received in culture bottles   Culture   Final  NO GROWTH 3 DAYS Performed at [facility], [address], [place], [place] [address]    Report Status PENDING  Incomplete  Culture, blood (Routine X 2) w Reflex to ID Panel     Status: None (Preliminary result)   Collection Time: 05/14/23  4:55 AM   Specimen: BLOOD RIGHT HAND  Result Value Ref Range Status   Specimen Description BLOOD RIGHT HAND  Final   Special Requests   Final    BOTTLES DRAWN AEROBIC AND ANAEROBIC Blood Culture adequate volume   Culture   Final    NO  GROWTH 3 DAYS Performed at [facility], [address] [place], [place] [address]    Report Status PENDING  Incomplete  Urine Culture (for pregnant, neutropenic or urologic patients or patients with an indwelling urinary catheter)     Status: Abnormal   Collection Time: 05/14/23  6:22 AM   Specimen: Urine, Clean Catch  Result Value Ref Range Status   Specimen Description URINE, CLEAN CATCH  Final   Special Requests   Final    NONE Performed at [facility], [address] [place], [place] [address]    Culture 10,000 COLONIES/mL ENTEROBACTER CLOACAE (A)  Final   Report Status 05/16/2023 FINAL  Final   Organism ID, Bacteria ENTEROBACTER CLOACAE (A)  Final      Susceptibility   Enterobacter cloacae - MIC*    CEFEPIME <=0.12 SENSITIVE Sensitive     CIPROFLOXACIN <=0.25 SENSITIVE Sensitive     GENTAMICIN <=1 SENSITIVE Sensitive     IMIPENEM 0.5 SENSITIVE Sensitive     NITROFURANTOIN 64 INTERMEDIATE Intermediate     TRIMETH/SULFA <=20 SENSITIVE Sensitive     PIP/TAZO <=4 SENSITIVE Sensitive ug/mL    * 10,000 COLONIES/mL ENTEROBACTER CLOACAE  Resp panel by RT-PCR (RSV, Flu A&B, Covid) Anterior Nasal Swab     Status: None   Collection Time: 05/14/23  6:22 AM   Specimen: Anterior Nasal Swab  Result Value Ref Range Status   SARS Coronavirus 2 by RT PCR NEGATIVE NEGATIVE Final   Influenza A by PCR NEGATIVE NEGATIVE Final   Influenza B by PCR NEGATIVE NEGATIVE Final    Comment: (NOTE) The Xpert Xpress SARS-CoV-2/FLU/RSV plus assay is intended as an aid in the diagnosis of influenza from Nasopharyngeal swab specimens and should not be used as a sole basis for treatment. Nasal washings and aspirates are unacceptable for Xpert Xpress SARS-CoV-2/FLU/RSV testing.  Fact Sheet for Patients: BloggerCourse.com  Fact Sheet for Healthcare Providers: SeriousBroker.it  This test is not yet approved or cleared by the [place]  FDA and has been authorized for detection and/or diagnosis of SARS-CoV-2 by FDA under an Emergency Use Authorization (EUA). This EUA will remain in effect (meaning this test can be used) for the duration of the COVID-19 declaration under Section 564(b)(1) of the Act, 21 U.S.C. section 360bbb-3(b)(1), unless the authorization is terminated or revoked.     Resp Syncytial Virus by PCR NEGATIVE NEGATIVE Final    Comment: (NOTE) Fact Sheet for Patients: BloggerCourse.com  Fact Sheet for Healthcare Providers: SeriousBroker.it  This test is not yet approved or cleared by the [place] FDA and has been authorized for detection and/or diagnosis of SARS-CoV-2 by FDA under an Emergency Use Authorization (EUA). This EUA will remain in effect (meaning this test can be used) for the duration of the COVID-19 declaration under Section 564(b)(1) of the Act, 21 U.S.C. section 360bbb-3(b)(1), unless the authorization is terminated or revoked.  Performed at [facility], [address], [place], [place] [address]     Radiology Studies: DG CHEST PORT 1 VIEW Result Date: 05/17/2023 CLINICAL DATA:  [id number] SOB (shortness of breath) [id number] EXAM: PORTABLE CHEST 1 VIEW COMPARISON:  May 16, 2023 FINDINGS: The cardiomediastinal silhouette is unchanged in contour. The enteric tube courses through the chest to the abdomen beyond the field-of-view. LEFT chest cardiac pacing device. Atherosclerotic calcifications. Favor increased layering RIGHT pleural effusion. Favored small LEFT pleural effusion. No pneumothorax. Bibasilar heterogeneous opacities, favored slightly increased throughout the RIGHT lung. Renal contrast of the LEFT kidney. IMPRESSION: 1. Favor increased layering RIGHT pleural effusion with adjacent atelectasis. 2. Favored small LEFT pleural effusion. 3. Background of interstitial prominence and heterogeneous opacities, possibly reflecting edema  versus atypical infection or aspiration. 4. Retained contrast within the LEFT kidney likely reflecting renal impairment. Electronically Signed   By: Meda Klinefelter M.D.   On: 05/17/2023 10:25   DG CHEST PORT 1 VIEW Result Date: 05/16/2023 CLINICAL DATA:  Shortness of breath.  History of stroke EXAM: PORTABLE CHEST 1 VIEW COMPARISON:  X-ray 05/16/2023 earlier FINDINGS: Enteric tube with tip extending beneath the diaphragm. Left upper chest pacemaker. Enlarged cardiopericardial silhouette with calcified aorta. Central vascular congestion. Persistent lung base opacities and effusions, left greater than right. No pneumothorax. Surgical changes along the upper central abdomen. IMPRESSION: No significant oval change when adjusted for technique. Electronically Signed   By: Karen Kays M.D.   On: 05/16/2023 12:42   DG Abd Portable 1V Result Date: 05/16/2023 CLINICAL DATA:  Feeding tube placement EXAM: PORTABLE ABDOMEN - 1 VIEW COMPARISON:  05/14/2023 x-ray FINDINGS: Interval below prior NG tube. New feeding tube with tip crossing to the right of the spine, likely along the extreme distal stomach or very proximal duodenum. Minimal bowel gas in the upper abdomen. Surgical changes. Overlapping cardiac leads. Limited x-ray for tube placement. New left retrocardiac lung opacity. Small pleural effusions. Please see separate chest x-ray IMPRESSION: Limited x-ray for tube placement has feeding tube crossing to the right of the spine, likely distal stomach or very proximal duodenum Electronically Signed   By: Karen Kays M.D.   On: 05/16/2023 12:41   ECHOCARDIOGRAM COMPLETE Result Date: 05/16/2023    ECHOCARDIOGRAM REPORT   Patient Name:   Ruth Roth Date of Exam: 05/16/2023 Medical Rec #:  [id number]    Height:       63.0 in Accession #:    [id number]   Weight:       137.1 lb Date of Birth:  [birth year]    BSA:          1.647 m Patient Age:    89 years     BP:           149/53 mmHg Patient Gender: F            HR:            75 bpm. Exam Location:  Inpatient Procedure: 2D Echo, Cardiac Doppler and Color Doppler (Both Spectral and Color            Flow Doppler were utilized during procedure). Indications:    Atrial Fibrillation I48.91 Elevated Troponin  History:        Patient has prior history of Echocardiogram examinations, most                 recent 04/04/2022. CHF, Stroke; Aortic Valve Disease.  Sonographer:    Webb Laws Referring Phys: [id number] SUBRINA  SUNDIL IMPRESSIONS  1. Left ventricular ejection fraction, by estimation, is 60 to 65%. The left ventricle has normal function. The left ventricle has no regional wall motion abnormalities. There is mild left ventricular hypertrophy. Left ventricular diastolic parameters are indeterminate.  2. Pacing leads seen in RA and RV. Right ventricular systolic function is normal. The right ventricular size is normal.  3. Left atrial size was severely dilated.  4. Right atrial size was severely dilated.  5. The mitral valve is degenerative. Trivial mitral valve regurgitation. No evidence of mitral stenosis. Severe mitral annular calcification.  6. The aortic valve was not well visualized. There is moderate calcification of the aortic valve. There is moderate thickening of the aortic valve. Aortic valve regurgitation is mild. Moderate aortic valve stenosis.  7. The inferior vena cava is normal in size with greater than 50% respiratory variability, suggesting right atrial pressure of 3 mmHg. FINDINGS  Left Ventricle: Left ventricular ejection fraction, by estimation, is 60 to 65%. The left ventricle has normal function. The left ventricle has no regional wall motion abnormalities. Strain was performed and the global longitudinal strain is indeterminate. The left ventricular internal cavity size was normal in size. There is mild left ventricular hypertrophy. Left ventricular diastolic parameters are indeterminate. Right Ventricle: Pacing leads seen in RA and RV. The right ventricular size  is normal. No increase in right ventricular wall thickness. Right ventricular systolic function is normal. Left Atrium: Left atrial size was severely dilated. Right Atrium: Right atrial size was severely dilated. Pericardium: There is no evidence of pericardial effusion. Mitral Valve: The mitral valve is degenerative in appearance. There is moderate thickening of the mitral valve leaflet(s). There is moderate calcification of the mitral valve leaflet(s). Severe mitral annular calcification. Trivial mitral valve regurgitation. No evidence of mitral valve stenosis. Tricuspid Valve: The tricuspid valve is normal in structure. Tricuspid valve regurgitation is mild . No evidence of tricuspid stenosis. Aortic Valve: The aortic valve was not well visualized. There is moderate calcification of the aortic valve. There is moderate thickening of the aortic valve. Aortic valve regurgitation is mild. Aortic regurgitation PHT measures 371 msec. Moderate aortic  stenosis is present. Aortic valve mean gradient measures 22.0 mmHg. Aortic valve peak gradient measures 39.2 mmHg. Aortic valve area, by VTI measures 0.54 cm. Pulmonic Valve: The pulmonic valve was normal in structure. Pulmonic valve regurgitation is trivial. No evidence of pulmonic stenosis. Aorta: The aortic root is normal in size and structure. Venous: The inferior vena cava is normal in size with greater than 50% respiratory variability, suggesting right atrial pressure of 3 mmHg. IAS/Shunts: No atrial level shunt detected by color flow Doppler. Additional Comments: 3D was performed not requiring image post processing on an independent workstation and was indeterminate. A device lead is visualized.  LEFT VENTRICLE PLAX 2D LVIDd:         3.40 cm   Diastology LVIDs:         2.40 cm   LV e' medial:    4.13 cm/s LV PW:         1.00 cm   LV E/e' medial:  53.0 LV IVS:        1.10 cm   LV e' lateral:   7.07 cm/s LVOT diam:     1.40 cm   LV E/e' lateral: 31.0 LV SV:          34 LV SV Index:   21 LVOT Area:     1.54 cm  RIGHT VENTRICLE  IVC RV Basal diam:  3.70 cm    IVC diam: 2.00 cm RV S prime:     7.40 cm/s TAPSE (M-mode): 2.0 cm LEFT ATRIUM             Index        RIGHT ATRIUM           Index LA diam:        4.60 cm 2.79 cm/m   RA Area:     13.30 cm LA Vol (A2C):   55.3 ml 33.57 ml/m  RA Volume:   29.60 ml  17.97 ml/m LA Vol (A4C):   46.6 ml 28.29 ml/m LA Biplane Vol: 51.6 ml 31.33 ml/m  AORTIC VALVE AV Area (Vmax):    0.50 cm AV Area (Vmean):   0.49 cm AV Area (VTI):     0.54 cm AV Vmax:           313.00 cm/s AV Vmean:          219.000 cm/s AV VTI:            0.627 m AV Peak Grad:      39.2 mmHg AV Mean Grad:      22.0 mmHg LVOT Vmax:         101.00 cm/s LVOT Vmean:        70.000 cm/s LVOT VTI:          0.220 m LVOT/AV VTI ratio: 0.35 AI PHT:            371 msec  AORTA Ao Root diam: 2.40 cm Ao Asc diam:  2.60 cm MITRAL VALVE MV Area (PHT): 4.12 cm     SHUNTS MV Decel Time: 184 msec     Systemic VTI:  0.22 m MV E velocity: 219.00 cm/s  Systemic Diam: 1.40 cm Charlton Haws MD Electronically signed by Charlton Haws MD Signature Date/Time: 05/16/2023/12:00:31 PM    Final    DG Chest Port 1 View Result Date: 05/16/2023 CLINICAL DATA:  [id number].  Shortness of breath. EXAM: PORTABLE CHEST 1 VIEW COMPARISON:  Portable chest yesterday at 10:44 a.m. FINDINGS: 3:40 a.m. Left chest dual lead pacing system and wire insertions are unchanged. Stable cardiomegaly. There is persistent perihilar vascular congestion and mild interstitial edema with small but increasing pleural effusions. There is increased opacity in the lung bases which could be due to atelectasis or consolidation. The remaining lungs are clear. The mediastinum is stable. Aortic atherosclerosis. No new osseous findings.  Osteopenia.  Thoracic spondylosis. IMPRESSION: 1. Persistent perihilar vascular congestion and mild interstitial edema with small but increasing pleural effusions. 2. Increased opacity in the lung  bases which could be due to atelectasis or consolidation. 3. Stable cardiomegaly. Electronically Signed   By: Almira Bar M.D.   On: 05/16/2023 06:43   Scheduled Meds:  arformoterol  15 mcg Nebulization BID   aspirin  81 mg Per Tube Daily   budesonide (PULMICORT) nebulizer solution  0.25 mg Nebulization BID   Chlorhexidine Gluconate Cloth  6 each Topical Daily   diltiazem  60 mg Per Tube Q8H   feeding supplement (PROSource TF20)  60 mL Per Tube Daily   Gerhardt's butt cream   Topical TID   insulin aspart  0-6 Units Subcutaneous Q4H   ipratropium  0.5 mg Nebulization TID   levalbuterol  0.63 mg Nebulization TID   metoprolol tartrate  50 mg Per Tube BID   mupirocin ointment  1 Application Nasal BID   sodium bicarbonate  650  mg Per Tube BID   thiamine  100 mg Per Tube Daily   Continuous Infusions:  feeding supplement (OSMOLITE 1.5 CAL) 40 mL/hr at 05/17/23 1800   furosemide 66 mL/hr at 05/17/23 1800   heparin 600 Units/hr (05/17/23 1800)    LOS: 4 days   Marguerita Merles, DO [facility] Available via Epic secure chat 7am-7pm After these hours, please refer to coverage provider listed on [email or web address] 05/17/2023, 7:49 PM

## 2023-05-17 NOTE — Progress Notes (Signed)
 STROKE TEAM PROGRESS NOTE   SUBJECTIVE (INTERVAL HISTORY) Patient son and daughter are at bedside.  Patient is lying in bed for echo exam. She still has mild tachypnea and some wheezing, but awake alert, able to close eyes on request but not following other commands, nonverbal. AKI got worsen even with hydration.    OBJECTIVE Temp:  [98.6 F (37 C)-99.8 F (37.7 C)] 99.4 F (37.4 C) (03/08 1155) Pulse Rate:  [59-84] 67 (03/08 1300) Cardiac Rhythm: Ventricular paced;Atrial fibrillation (03/08 1100) Resp:  [16-33] 24 (03/08 1300) BP: (130-168)/(41-133) 145/89 (03/08 1300) SpO2:  [88 %-96 %] 94 % (03/08 1300) Weight:  [62.5 kg] 62.5 kg (03/08 0419)  Recent Labs  Lab 05/16/23 2007 05/16/23 2357 05/17/23 0346 05/17/23 0742 05/17/23 1142  GLUCAP 145* 154* 172* 159* 139*   Recent Labs  Lab 05/13/23 1518 05/13/23 1526 05/13/23 1857 05/14/23 0155 05/14/23 0455 05/15/23 0538 05/16/23 0501 05/17/23 0539  NA 141 143   < > 140 142 140 143 147*  K 4.1 4.1   < > 3.3* 3.7 4.1 3.8 4.1  CL 109 113*  --   --  108 112* 114* 116*  CO2 17*  --   --   --  19* 15* 14* 18*  GLUCOSE 138* 135*  --   --  145* 91 89 135*  BUN 29* 31*  --   --  31* 35* 45* 64*  CREATININE 1.24* 1.10*  --   --  1.19* 2.34* 3.61* 4.49*  CALCIUM 9.5  --   --   --  9.0 8.6* 8.6* 9.3  MG 2.0  --   --   --   --   --  2.0 2.2  PHOS  --   --   --   --   --   --  6.0* 6.6*   < > = values in this interval not displayed.   Recent Labs  Lab 05/13/23 1518 05/14/23 0455 05/15/23 0538 05/16/23 0501 05/17/23 0539  AST 74* 43* 33 28 28  ALT 50* 41 37 37 39  ALKPHOS 98 79 70 73 82  BILITOT 1.3* 0.7 0.6 0.6 0.8  PROT 6.8 5.8* 5.7* 5.4* 6.2*  ALBUMIN 3.8 3.2* 3.5 3.2* 3.3*   Recent Labs  Lab 05/13/23 1518 05/13/23 1526 05/14/23 0155 05/14/23 0455 05/15/23 0538 05/16/23 0501 05/17/23 0539  WBC 14.0*  --   --  10.6* 8.9 8.8 9.4  NEUTROABS  --   --   --  8.9*  --  7.4 8.0*  HGB 15.6*   < > 13.6 13.1 10.7* 10.3*  10.9*  HCT 49.2*   < > 40.0 41.4 34.2* 33.2* 34.2*  MCV 98.0  --   --  97.6 99.7 100.0 96.9  PLT 206  --   --  170 155 138* 164   < > = values in this interval not displayed.   Recent Labs  Lab 05/13/23 1518 05/14/23 0455 05/15/23 1244 05/16/23 0501  CKTOTAL 1,135* 472* 311* 327*   No results for input(s): "LABPROT", "INR" in the last 72 hours.  Recent Labs    05/15/23 0933  COLORURINE YELLOW  LABSPEC >1.046*  PHURINE 5.0  GLUCOSEU NEGATIVE  HGBUR MODERATE*  BILIRUBINUR NEGATIVE  KETONESUR NEGATIVE  PROTEINUR 100*  NITRITE NEGATIVE  LEUKOCYTESUR TRACE*       Component Value Date/Time   CHOL 172 05/14/2023 0455   TRIG 189 (H) 05/14/2023 0455   HDL 67 05/14/2023 0455   CHOLHDL 2.6 05/14/2023  0455   VLDL 38 05/14/2023 0455   LDLCALC 67 05/14/2023 0455   Lab Results  Component Value Date   HGBA1C 5.4 05/14/2023   No results found for: "LABOPIA", "COCAINSCRNUR", "LABBENZ", "AMPHETMU", "THCU", "LABBARB"  Recent Labs  Lab 05/13/23 1518  ETH <10    I have personally reviewed the radiological images below and agree with the radiology interpretations.  DG CHEST PORT 1 VIEW Result Date: 05/17/2023 CLINICAL DATA:  [id number] SOB (shortness of breath) [id number] EXAM: PORTABLE CHEST 1 VIEW COMPARISON:  May 16, 2023 FINDINGS: The cardiomediastinal silhouette is unchanged in contour. The enteric tube courses through the chest to the abdomen beyond the field-of-view. LEFT chest cardiac pacing device. Atherosclerotic calcifications. Favor increased layering RIGHT pleural effusion. Favored small LEFT pleural effusion. No pneumothorax. Bibasilar heterogeneous opacities, favored slightly increased throughout the RIGHT lung. Renal contrast of the LEFT kidney. IMPRESSION: 1. Favor increased layering RIGHT pleural effusion with adjacent atelectasis. 2. Favored small LEFT pleural effusion. 3. Background of interstitial prominence and heterogeneous opacities, possibly reflecting edema versus  atypical infection or aspiration. 4. Retained contrast within the LEFT kidney likely reflecting renal impairment. Electronically Signed   By: Meda Klinefelter M.D.   On: 05/17/2023 10:25   DG CHEST PORT 1 VIEW Result Date: 05/16/2023 CLINICAL DATA:  Shortness of breath.  History of stroke EXAM: PORTABLE CHEST 1 VIEW COMPARISON:  X-ray 05/16/2023 earlier FINDINGS: Enteric tube with tip extending beneath the diaphragm. Left upper chest pacemaker. Enlarged cardiopericardial silhouette with calcified aorta. Central vascular congestion. Persistent lung base opacities and effusions, left greater than right. No pneumothorax. Surgical changes along the upper central abdomen. IMPRESSION: No significant oval change when adjusted for technique. Electronically Signed   By: Karen Kays M.D.   On: 05/16/2023 12:42   DG Abd Portable 1V Result Date: 05/16/2023 CLINICAL DATA:  Feeding tube placement EXAM: PORTABLE ABDOMEN - 1 VIEW COMPARISON:  05/14/2023 x-ray FINDINGS: Interval below prior NG tube. New feeding tube with tip crossing to the right of the spine, likely along the extreme distal stomach or very proximal duodenum. Minimal bowel gas in the upper abdomen. Surgical changes. Overlapping cardiac leads. Limited x-ray for tube placement. New left retrocardiac lung opacity. Small pleural effusions. Please see separate chest x-ray IMPRESSION: Limited x-ray for tube placement has feeding tube crossing to the right of the spine, likely distal stomach or very proximal duodenum Electronically Signed   By: Karen Kays M.D.   On: 05/16/2023 12:41   ECHOCARDIOGRAM COMPLETE Result Date: 05/16/2023    ECHOCARDIOGRAM REPORT   Patient Name:   Ruth Roth [facility] Date of Exam: 05/16/2023 Medical Rec #:  [id number]    Height:       63.0 in Accession #:    [id number]   Weight:       137.1 lb Date of Birth:  [birth year]    BSA:          1.647 m Patient Age:    89 years     BP:           149/53 mmHg Patient Gender: F            HR:           75  bpm. Exam Location:  Inpatient Procedure: 2D Echo, Cardiac Doppler and Color Doppler (Both Spectral and Color            Flow Doppler were utilized during procedure). Indications:    Atrial Fibrillation I48.91 Elevated Troponin  History:  Patient has prior history of Echocardiogram examinations, most                 recent 04/04/2022. CHF, Stroke; Aortic Valve Disease.  Sonographer:    Webb Laws Referring Phys: [id number] SUBRINA SUNDIL IMPRESSIONS  1. Left ventricular ejection fraction, by estimation, is 60 to 65%. The left ventricle has normal function. The left ventricle has no regional wall motion abnormalities. There is mild left ventricular hypertrophy. Left ventricular diastolic parameters are indeterminate.  2. Pacing leads seen in RA and RV. Right ventricular systolic function is normal. The right ventricular size is normal.  3. Left atrial size was severely dilated.  4. Right atrial size was severely dilated.  5. The mitral valve is degenerative. Trivial mitral valve regurgitation. No evidence of mitral stenosis. Severe mitral annular calcification.  6. The aortic valve was not well visualized. There is moderate calcification of the aortic valve. There is moderate thickening of the aortic valve. Aortic valve regurgitation is mild. Moderate aortic valve stenosis.  7. The inferior vena cava is normal in size with greater than 50% respiratory variability, suggesting right atrial pressure of 3 mmHg. FINDINGS  Left Ventricle: Left ventricular ejection fraction, by estimation, is 60 to 65%. The left ventricle has normal function. The left ventricle has no regional wall motion abnormalities. Strain was performed and the global longitudinal strain is indeterminate. The left ventricular internal cavity size was normal in size. There is mild left ventricular hypertrophy. Left ventricular diastolic parameters are indeterminate. Right Ventricle: Pacing leads seen in RA and RV. The right ventricular size is  normal. No increase in right ventricular wall thickness. Right ventricular systolic function is normal. Left Atrium: Left atrial size was severely dilated. Right Atrium: Right atrial size was severely dilated. Pericardium: There is no evidence of pericardial effusion. Mitral Valve: The mitral valve is degenerative in appearance. There is moderate thickening of the mitral valve leaflet(s). There is moderate calcification of the mitral valve leaflet(s). Severe mitral annular calcification. Trivial mitral valve regurgitation. No evidence of mitral valve stenosis. Tricuspid Valve: The tricuspid valve is normal in structure. Tricuspid valve regurgitation is mild . No evidence of tricuspid stenosis. Aortic Valve: The aortic valve was not well visualized. There is moderate calcification of the aortic valve. There is moderate thickening of the aortic valve. Aortic valve regurgitation is mild. Aortic regurgitation PHT measures 371 msec. Moderate aortic  stenosis is present. Aortic valve mean gradient measures 22.0 mmHg. Aortic valve peak gradient measures 39.2 mmHg. Aortic valve area, by VTI measures 0.54 cm. Pulmonic Valve: The pulmonic valve was normal in structure. Pulmonic valve regurgitation is trivial. No evidence of pulmonic stenosis. Aorta: The aortic root is normal in size and structure. Venous: The inferior vena cava is normal in size with greater than 50% respiratory variability, suggesting right atrial pressure of 3 mmHg. IAS/Shunts: No atrial level shunt detected by color flow Doppler. Additional Comments: 3D was performed not requiring image post processing on an independent workstation and was indeterminate. A device lead is visualized.  LEFT VENTRICLE PLAX 2D LVIDd:         3.40 cm   Diastology LVIDs:         2.40 cm   LV e' medial:    4.13 cm/s LV PW:         1.00 cm   LV E/e' medial:  53.0 LV IVS:        1.10 cm   LV e' lateral:   7.07 cm/s LVOT diam:  1.40 cm   LV E/e' lateral: 31.0 LV SV:         34  LV SV Index:   21 LVOT Area:     1.54 cm  RIGHT VENTRICLE            IVC RV Basal diam:  3.70 cm    IVC diam: 2.00 cm RV S prime:     7.40 cm/s TAPSE (M-mode): 2.0 cm LEFT ATRIUM             Index        RIGHT ATRIUM           Index LA diam:        4.60 cm 2.79 cm/m   RA Area:     13.30 cm LA Vol (A2C):   55.3 ml 33.57 ml/m  RA Volume:   29.60 ml  17.97 ml/m LA Vol (A4C):   46.6 ml 28.29 ml/m LA Biplane Vol: 51.6 ml 31.33 ml/m  AORTIC VALVE AV Area (Vmax):    0.50 cm AV Area (Vmean):   0.49 cm AV Area (VTI):     0.54 cm AV Vmax:           313.00 cm/s AV Vmean:          219.000 cm/s AV VTI:            0.627 m AV Peak Grad:      39.2 mmHg AV Mean Grad:      22.0 mmHg LVOT Vmax:         101.00 cm/s LVOT Vmean:        70.000 cm/s LVOT VTI:          0.220 m LVOT/AV VTI ratio: 0.35 AI PHT:            371 msec  AORTA Ao Root diam: 2.40 cm Ao Asc diam:  2.60 cm MITRAL VALVE MV Area (PHT): 4.12 cm     SHUNTS MV Decel Time: 184 msec     Systemic VTI:  0.22 m MV E velocity: 219.00 cm/s  Systemic Diam: 1.40 cm Charlton Haws MD Electronically signed by Charlton Haws MD Signature Date/Time: 05/16/2023/12:00:31 PM    Final    DG Chest Port 1 View Result Date: 05/16/2023 CLINICAL DATA:  [id number].  Shortness of breath. EXAM: PORTABLE CHEST 1 VIEW COMPARISON:  Portable chest yesterday at 10:44 a.m. FINDINGS: 3:40 a.m. Left chest dual lead pacing system and wire insertions are unchanged. Stable cardiomegaly. There is persistent perihilar vascular congestion and mild interstitial edema with small but increasing pleural effusions. There is increased opacity in the lung bases which could be due to atelectasis or consolidation. The remaining lungs are clear. The mediastinum is stable. Aortic atherosclerosis. No new osseous findings.  Osteopenia.  Thoracic spondylosis. IMPRESSION: 1. Persistent perihilar vascular congestion and mild interstitial edema with small but increasing pleural effusions. 2. Increased opacity in the lung  bases which could be due to atelectasis or consolidation. 3. Stable cardiomegaly. Electronically Signed   By: Almira Bar M.D.   On: 05/16/2023 06:43   US RENAL Result Date: 05/15/2023 CLINICAL DATA:  Acute renal in EXAM: RENAL / URINARY TRACT ULTRASOUND COMPLETE COMPARISON:  None Available. FINDINGS: Right Kidney: Renal measurements: 7.5 x 3.9 x 3.6 cm. = volume: 54.9 mL. Echogenicity within normal limits. No mass or hydronephrosis visualized. Large upper pole simple cyst is noted measuring 4.2 cm. Left Kidney: Renal measurements: 8.6 x 4.0 x 4.2 cm. = volume: 75.6 mL. Echogenicity  within normal limits. No mass or hydronephrosis visualized. Bladder: Appears normal for degree of bladder distention. Other: None. IMPRESSION: Right simple renal cyst.  No follow-up is recommended. Electronically Signed   By: Alcide Clever M.D.   On: 05/15/2023 23:11   DG CHEST PORT 1 VIEW Result Date: 05/15/2023 CLINICAL DATA:  Wheezing. EXAM: PORTABLE CHEST 1 VIEW COMPARISON:  Radiographs 05/14/2023 and 05/13/2023.  CT 05/13/2023. FINDINGS: 1044 hours. Interval extubation and removal of the enteric tube. Left subclavian pacemaker leads appear unchanged, projecting over the right atrium and right ventricle. The heart size and mediastinal contours are stable. However, there are new diffuse ground-glass opacities bilaterally with septal thickening and small left greater than right pleural effusions. No evidence of pneumothorax. The bones appear unchanged. Asymmetric glenohumeral degenerative changes on the right. IMPRESSION: New diffuse ground-glass opacities bilaterally with septal thickening and small left greater than right pleural effusions, most consistent with pulmonary edema/congestive heart failure. Electronically Signed   By: Carey Bullocks M.D.   On: 05/15/2023 14:44   IR CT Head Ltd Result Date: 05/15/2023 INDICATION: New onset right-sided hemiplegia and aphasia. Occluded left middle cerebral artery on CT angiogram of  the head and neck. EXAM: 1. EMERGENT LARGE VESSEL OCCLUSION THROMBOLYSIS (anterior CIRCULATION) COMPARISON:  CT angiogram of the head and neck of May 13, 2023. MEDICATIONS: No antibiotic was administered within 1 hour of the procedure. ANESTHESIA/SEDATION: General anesthesia. CONTRAST:  Omnipaque 300 approximately 120 mL. FLUOROSCOPY TIME:  Fluoroscopy Time: 30 minutes 0 seconds (7419 mGy). COMPLICATIONS: None immediate. TECHNIQUE: Following a full explanation of the procedure along with the potential associated complications, an informed witnessed consent was obtained. The risks of intracranial hemorrhage of 10%, worsening neurological deficit, ventilator dependency, death and inability to revascularize were all reviewed in detail with the patient's son. The patient was then put under general anesthesia by the Department of Anesthesiology at [facility]. The right groin was prepped and draped in the usual sterile fashion. Thereafter using modified Seldinger technique, transfemoral access into the right common femoral artery was obtained without difficulty. Over an 0.035 inch guidewire an 8 French 25 cm Pinnacle sheath was inserted. Through this, and also over an 0.035 inch guidewire a combination of a support 088 100 cm Zoom catheter and a 125 cm Simmons 2 support catheter was advanced to the aortic arch region and selectively positioned in the left common carotid artery. Arteriograms were then performed centered extra cranially and intracranially. FINDINGS: Left common carotid arteriogram demonstrates the left external carotid artery and its major branches to be widely patent. The left internal carotid artery at the bulb to the cranial skull base is widely patent. The petrous, the cavernous and the supraclinoid segments demonstrate wide patency. The left anterior cerebral artery opacifies into the capillary and venous phases. The left middle cerebral artery demonstrates patency of the proximal 2/3 of the  M1 segment. The distal 1/3 demonstrates occlusion. PROCEDURE: Through the Zoom 088 100 cm support catheter in the cervical petrous junction, a combination of an 071 132 cm Zoom with an 021 150 cm Phenom microcatheter was advanced over an 018 inch standard micro guidewire with a moderate J configuration to the supraclinoid left ICA. The micro guidewire was then advanced to the distal left M1 segment followed by the microcatheter followed by the 071 Zoom aspiration catheter. The Zoom aspiration catheter was then engaged into the occluded distal M1 segment. Advancement of the microcatheter through the occluded distal M1 segment into the inferior over the superior divisions  was met with significant resistance. The microcatheter and micro guidewire were removed. Constant aspiration was then applied at the hub of the Zoom aspiration catheter for approximately 2 minutes. Thereafter, with proximal aspiration, the combination of the Zoom aspiration catheter was retrieved and removed. A control arteriogram performed through the 088 Zoom support catheter demonstrated improved recanalization of the distal M1 with a few minor opacifying distal to this. A second pass was then made using an 062 134 cm Penumbra aspiration catheter with an 043 distal aspiration over an 018 inch micro guidewire. The microcatheter was again advanced to the mildly recanalized distal M1 segment followed by the microcatheter and the 062 Zoom aspiration catheter which was advanced into the occluded left M1 segment. Advancement of the micro guidewire through the firm clot was met with resistance. The microcatheter and micro guidewire were removed. Constant aspiration was then applied at the hub of the 062 Penumbra aspiration catheter for 2 minutes. A control arteriogram performed through the Zoom support catheter guidewire now demonstrated partial revascularization of the inferior division. A third pass was then made using the 062 Penumbra aspiration  catheter with an 021 150 cm Phenom microcatheter which was advanced over an 018 inch micro guidewire with a moderate J configuration to the distal M1 segment. The micro guidewire was then gently advanced without difficulty through the inferior division of the M2 M3 region followed by the microcatheter. The micro guidewire was removed. Good aspiration obtained from the hub of the microcatheter. A gentle control arteriogram performed through this demonstrates safe positioning of the tip of the microcatheter. A 4 mm x 40 mm Solitaire X retrieval device was then advanced to the distal end of the microcatheter and deployed in the usual manner such that the proximal portion covered the distal M1 segment. The 062 aspiration catheter was now engaged into the origin of the inferior division. A constant aspiration was applied at the hub of the 062 aspiration catheter for approximately 2 minutes. Thereafter the combination of the retrieval device and the aspiration catheter was retrieved and removed. A few more chunks of clot were noted in the aspirate. A control arteriogram performed through the support catheter in the left internal carotid artery now demonstrated revascularization of the left MCA branches achieving a TICI 3 revascularization. Also noted at this time was a small filling defect just distal to the origin of the middle branch of the MCA bifurcation. This also demonstrated moderate spasm at its origin which responded to 4 aliquots of 25 mcg of nitroglycerin intra-arterially. The small filling defect in the proximal aspect of the superior division was then cleared with another pass with the 062 Penumbra aspiration catheter advanced in combination with a 150 cm Phenom micro catheter over an 018 inch micro guidewire. Aspiration was applied right at the face of the filling defect for 30 seconds. A control arteriogram performed following removal of the aspiration catheters demonstrated clearance of the filling defect  with mild residual vasospasm which again responded to 25 mcg of nitroglycerin intra-arterially. A TICI 3 revascularization was achieved. Patency was maintained of the left anterior cerebral artery distribution. A final control arteriogram performed through the Zoom support catheter in the distal left common carotid artery demonstrated patency of the left internal carotid artery extra cranially and intracranially. The intracranial vasculature remained widely patent without any filling defects. An 8 French Angio-Seal closure device was deployed for hemostasis at the at the right groin puncture site. Distal pulses remained present in both feet unchanged compared to  prior to the procedure. A flat panel CT of the brain demonstrated no evidence of intracranial hemorrhage. The patient was left intubated due to patient's initial neurologic condition. She was then transferred to the neuro ICU for post revascularization care. IMPRESSION: Status post endovascular complete revascularization of occluded distal left middle cerebral artery M1 segment, and the proximal portions of the superior and the inferior divisions with 3 passes of contact aspiration and 1 pass with a 4 mm x 40 mm Solitaire X retrieval device and contact aspiration achieving a TICI 3 revascularization. PLAN: Follow-up as per referring MD. Electronically Signed   By: Julieanne Cotton M.D.   On: 05/15/2023 08:27   IR PERCUTANEOUS ART THROMBECTOMY/INFUSION INTRACRANIAL INC DIAG ANGIO Result Date: 05/15/2023 INDICATION: New onset right-sided hemiplegia and aphasia. 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 EXAM: 1. EMERGENT LARGE VESSEL OCCLUSION THROMBOLYSIS (anterior CIRCULATION) COMPARISON:  CT angiogram of the head and neck of May 13, 2023. MEDICATIONS: No antibiotic was administered within 1 hour of the procedure. ANESTHESIA/SEDATION: General anesthesia. CONTRAST:  Omnipaque 300 approximately 120 mL. FLUOROSCOPY TIME:   Fluoroscopy Time: 30 minutes 0 seconds (7419 mGy). COMPLICATIONS: None immediate. TECHNIQUE: Following a full explanation of the procedure along with the potential associated complications, an informed witnessed consent was obtained. The risks of intracranial hemorrhage of 10%, worsening neurological deficit, ventilator dependency, death and inability to revascularize were all reviewed in detail with the patient's son. The patient was then put under general anesthesia by the Department of Anesthesiology at [facility]. The right groin was prepped and draped in the usual sterile fashion. Thereafter using modified Seldinger technique, transfemoral access into the right common femoral artery was obtained without difficulty. Over an 0.035 inch guidewire an 8 French 25 cm Pinnacle sheath was inserted. Through this, and also over an 0.035 inch guidewire a combination of a support 088 100 cm Zoom catheter and a 125 cm Simmons 2 support catheter was advanced to the aortic arch region and selectively positioned in the left common carotid artery. Arteriograms were then performed centered extra cranially and intracranially. FINDINGS: Left common carotid arteriogram demonstrates the left external carotid artery and its major branches to be widely patent. The left internal carotid artery at the bulb to the cranial skull base is widely patent. The petrous, the cavernous and the supraclinoid segments demonstrate wide patency. The left anterior cerebral artery opacifies into the capillary and venous phases. The left middle cerebral artery demonstrates patency of the proximal 2/3 of the M1 segment. The distal 1/3 demonstrates occlusion. PROCEDURE: Through the Zoom 088 100 cm support catheter in the cervical petrous junction, a combination of an 071 132 cm Zoom with an 021 150 cm Phenom microcatheter was advanced over an 018 inch standard micro guidewire with a moderate J configuration to the supraclinoid left ICA. The micro  guidewire was then advanced to the distal left M1 segment followed by the microcatheter followed by the 071 Zoom aspiration catheter. The Zoom aspiration catheter was then engaged into the occluded distal M1 segment. Advancement of the microcatheter through the occluded distal M1 segment into the inferior over the superior divisions was met with significant resistance. The microcatheter and micro guidewire were removed. Constant aspiration was then applied at the hub of the Zoom aspiration catheter for approximately 2 minutes. Thereafter, with proximal aspiration, the combination of the Zoom aspiration catheter was retrieved and removed. A control arteriogram performed through the 088 Zoom support catheter demonstrated improved recanalization  of the distal M1 with a few minor opacifying distal to this. A second pass was then made using an 062 134 cm Penumbra aspiration catheter with an 043 distal aspiration over an 018 inch micro guidewire. The microcatheter was again advanced to the mildly recanalized distal M1 segment followed by the microcatheter and the 062 Zoom aspiration catheter which was advanced into the occluded left M1 segment. Advancement of the micro guidewire through the firm clot was met with resistance. The microcatheter and micro guidewire were removed. Constant aspiration was then applied at the hub of the 062 Penumbra aspiration catheter for 2 minutes. A control arteriogram performed through the Zoom support catheter guidewire now demonstrated partial revascularization of the inferior division. A third pass was then made using the 062 Penumbra aspiration catheter with an 021 150 cm Phenom microcatheter which was advanced over an 018 inch micro guidewire with a moderate J configuration to the distal M1 segment. The micro guidewire was then gently advanced without difficulty through the inferior division of the M2 M3 region followed by the microcatheter. The micro guidewire was removed. Good  aspiration obtained from the hub of the microcatheter. A gentle control arteriogram performed through this demonstrates safe positioning of the tip of the microcatheter. A 4 mm x 40 mm Solitaire X retrieval device was then advanced to the distal end of the microcatheter and deployed in the usual manner such that the proximal portion covered the distal M1 segment. The 062 aspiration catheter was now engaged into the origin of the inferior division. A constant aspiration was applied at the hub of the 062 aspiration catheter for approximately 2 minutes. Thereafter the combination of the retrieval device and the aspiration catheter was retrieved and removed. A few more chunks of clot were noted in the aspirate. A control arteriogram performed through the support catheter in the left internal carotid artery now demonstrated revascularization of the left MCA branches achieving a TICI 3 revascularization. Also noted at this time was a small filling defect just distal to the origin of the middle branch of the MCA bifurcation. This also demonstrated moderate spasm at its origin which responded to 4 aliquots of 25 mcg of nitroglycerin intra-arterially. The small filling defect in the proximal aspect of the superior division was then cleared with another pass with the 062 Penumbra aspiration catheter advanced in combination with a 150 cm Phenom micro catheter over an 018 inch micro guidewire. Aspiration was applied right at the face of the filling defect for 30 seconds. A control arteriogram performed following removal of the aspiration catheters demonstrated clearance of the filling defect with mild residual vasospasm which again responded to 25 mcg of nitroglycerin intra-arterially. A TICI 3 revascularization was achieved. Patency was maintained of the left anterior cerebral artery distribution. A final control arteriogram performed through the Zoom support catheter in the distal left common carotid artery demonstrated  patency of the left internal carotid artery extra cranially and intracranially. The intracranial vasculature remained widely patent without any filling defects. An 8 French Angio-Seal closure device was deployed for hemostasis at the at the right groin puncture site. Distal pulses remained present in both feet unchanged compared to prior to the procedure. A flat panel CT of the brain demonstrated no evidence of intracranial hemorrhage. The patient was left intubated due to patient's initial neurologic condition. She was then transferred to the neuro ICU for post revascularization care. IMPRESSION: Status post endovascular complete revascularization of occluded distal left middle cerebral artery M1 segment, and the  proximal portions of the superior and the inferior divisions with 3 passes of contact aspiration and 1 pass with a 4 mm x 40 mm Solitaire X retrieval device and contact aspiration achieving a TICI 3 revascularization. PLAN: Follow-up as per referring MD. Electronically Signed   By: Julieanne Cotton M.D.   On: 05/15/2023 08:27   MR BRAIN WO CONTRAST Result Date: 05/14/2023 CLINICAL DATA:  Stroke, follow up EXAM: MRI HEAD WITHOUT CONTRAST MRA HEAD WITHOUT CONTRAST TECHNIQUE: Multiplanar, multi-echo pulse sequences of the brain and surrounding structures were acquired without intravenous contrast. Angiographic images of the Circle of Willis were acquired using MRA technique without intravenous contrast. COMPARISON:  CTA head/neck May 13, 2023. FINDINGS: MRI HEAD FINDINGS Brain: Many acute Infarcts throughout the left MCA territory. Associated edema without substantial mass effect. No midline shift. Remote left PCA territory infarct. Patchy T2/FLAIR hyperintensities the white matter, compatible with chronic microvascular ischemic disease. Remote left cerebellar infarct. No mass lesion. Cerebral atrophy. Vascular: See below. Skull and upper cervical spine: Normal marrow signal. Sinuses/Orbits: Mild  paranasal sinus mucosal thickening. No acute orbital findings. Other: No mastoid effusions. MRA HEAD FINDINGS Anterior circulation: Bilateral intracranial ICAs, MCAs, and ACAs are patent without proximal hemodynamically significant stenosis. Previously seen distal left M1 MCA stenosis versus occlusion now appears patent. Posterior circulation: Bilateral intradural vertebral arteries, basilar artery and bilateral posterior cerebral arteries are patent without proximal hemodynamically significant stenosis. IMPRESSION: 1. Multiple acute infarcts throughout the left MCA territory. 2. Previously seen distal left M1 MCA stenosis versus occlusion now appears patent. No large vessel occlusion or proximal hemodynamically significant stenosis. Electronically Signed   By: Feliberto Harts M.D.   On: 05/14/2023 19:31   MR ANGIO HEAD WO CONTRAST Result Date: 05/14/2023 CLINICAL DATA:  Stroke, follow up EXAM: MRI HEAD WITHOUT CONTRAST MRA HEAD WITHOUT CONTRAST TECHNIQUE: Multiplanar, multi-echo pulse sequences of the brain and surrounding structures were acquired without intravenous contrast. Angiographic images of the Circle of Willis were acquired using MRA technique without intravenous contrast. COMPARISON:  CTA head/neck May 13, 2023. FINDINGS: MRI HEAD FINDINGS Brain: Many acute Infarcts throughout the left MCA territory. Associated edema without substantial mass effect. No midline shift. Remote left PCA territory infarct. Patchy T2/FLAIR hyperintensities the white matter, compatible with chronic microvascular ischemic disease. Remote left cerebellar infarct. No mass lesion. Cerebral atrophy. Vascular: See below. Skull and upper cervical spine: Normal marrow signal. Sinuses/Orbits: Mild paranasal sinus mucosal thickening. No acute orbital findings. Other: No mastoid effusions. MRA HEAD FINDINGS Anterior circulation: Bilateral intracranial ICAs, MCAs, and ACAs are patent without proximal hemodynamically significant  stenosis. Previously seen distal left M1 MCA stenosis versus occlusion now appears patent. Posterior circulation: Bilateral intradural vertebral arteries, basilar artery and bilateral posterior cerebral arteries are patent without proximal hemodynamically significant stenosis. IMPRESSION: 1. Multiple acute infarcts throughout the left MCA territory. 2. Previously seen distal left M1 MCA stenosis versus occlusion now appears patent. No large vessel occlusion or proximal hemodynamically significant stenosis. Electronically Signed   By: Feliberto Harts M.D.   On: 05/14/2023 19:31   DG Abd Portable 1V Result Date: 05/14/2023 CLINICAL DATA:  Check gastric catheter placement EXAM: PORTABLE ABDOMEN - 1 VIEW COMPARISON:  None Available. FINDINGS: Gastric catheter is noted within the stomach. Contrast is noted within the kidneys bilaterally. No free air is seen. IMPRESSION: Gastric catheter in the stomach. Electronically Signed   By: Alcide Clever M.D.   On: 05/14/2023 02:54   Portable Chest x-ray Result Date: 05/14/2023 CLINICAL DATA:  Status  post intubation EXAM: PORTABLE CHEST 1 VIEW COMPARISON:  Film from the previous day. FINDINGS: Cardiac shadow is within normal limits. Pacing device is again seen. Endotracheal tube and gastric catheter are noted in satisfactory position. Lungs are free of acute infiltrate or effusion. IMPRESSION: No active disease. Electronically Signed   By: Alcide Clever M.D.   On: 05/14/2023 02:53   CT ANGIO HEAD NECK W WO CM W PERF Result Date: 05/13/2023 CLINICAL DATA:  Neuro deficit, acute, stroke suspected EXAM: CT ANGIOGRAPHY HEAD AND NECK CT PERFUSION BRAIN TECHNIQUE: Multidetector CT imaging of the head and neck was performed using the standard protocol during bolus administration of intravenous contrast. Multiplanar CT image reconstructions and MIPs were obtained to evaluate the vascular anatomy. Carotid stenosis measurements (when applicable) are obtained utilizing NASCET criteria,  using the distal internal carotid diameter as the denominator. Multiphase CT imaging of the brain was performed following IV bolus contrast injection. Subsequent parametric perfusion maps were calculated using RAPID software. RADIATION DOSE REDUCTION: This exam was performed according to the departmental dose-optimization program which includes automated exposure control, adjustment of the mA and/or kV according to patient size and/or use of iterative reconstruction technique. CONTRAST:  OMNIPAQUE IOHEXOL 350 MG/ML SOLN COMPARISON:  Same day CT head. FINDINGS: CTA NECK FINDINGS Aortic arch: Atherosclerosis.  Great vessel origins are patent. Right carotid system: Atherosclerosis at the carotid bifurcation without greater than 50% stenosis. Also, atherosclerosis at the skull base with approximately 60% stenosis. Left carotid system: Atherosclerosis without greater than 50% stenosis. Vertebral arteries: Left dominant. No significant (greater than 50%) stenosis. Skeleton: No acute abnormality on limited assessment. Other neck: No acute abnormality on limited assessment. Upper chest: Visualized lung apices are clear. Review of the MIP images confirms the above findings CTA HEAD FINDINGS Anterior circulation: Bilateral intracranial ICAs are patent without significant stenosis. Right MCA and bilateral ACAs are patent without proximal hemodynamically significant stenosis. Distal left M1 MCA occlusion versus severe stenosis. Asymmetric opacification of the left MCA vessels more distally. Posterior circulation: Bilateral intradural vertebral arteries, basilar artery and bilateral posterior cerebral arteries are patent without proximal hemodynamically significant stenosis. Venous sinuses: As permitted by contrast timing, patent. Review of the MIP images confirms the above findings CT Brain Perfusion Findings: CBF (<30%) Volume: 0mL Perfusion (Tmax>6.0s) volume: 50mL Mismatch Volume: 50mL Infarction Location:None  identified. IMPRESSION: 1. Occluded versus severely stenotic distal left M1 MCA with asymmetrically diminished distal left MCA opacification. Recommend code stroke activation and emergent neurology consultation. 2. Approximately 50 mL of left MCA penumbra without evidence of core infarct by CT perfusion. 3. Approximately 60% stenosis of the right ICA at the skull base. Findings and recommendations discussed with Dr. Wilkie Aye via telephone at Dr. Wilkie Aye. Electronically Signed   By: Feliberto Harts M.D.   On: 05/13/2023 21:40   DG Shoulder Right Portable Result Date: 05/13/2023 CLINICAL DATA:  Status post trauma. EXAM: RIGHT SHOULDER - 1 VIEW COMPARISON:  None Available. FINDINGS: Limited study secondary to numerous overlying radiopaque cardiac lead wires. There is no evidence of acute fracture or dislocation. Chronic changes are seen along the greater tubercle and inferior medial aspect of the right humeral. There are moderate severity degenerative changes. Soft tissues are unremarkable. IMPRESSION: 1. Limited study without evidence of acute fracture or dislocation. 2. Moderate severity chronic and degenerative changes. Electronically Signed   By: Aram Candela M.D.   On: 05/13/2023 17:46   DG Pelvis Portable Result Date: 05/13/2023 CLINICAL DATA:  Status post trauma. EXAM: PORTABLE PELVIS  1-2 VIEWS COMPARISON:  March 05, 2016 FINDINGS: There is no evidence of pelvic fracture or diastasis. No pelvic bone lesions are seen. IMPRESSION: Negative. Electronically Signed   By: Aram Candela M.D.   On: 05/13/2023 17:45   DG Chest Port 1 View Result Date: 05/13/2023 CLINICAL DATA:  Status post trauma. EXAM: PORTABLE CHEST 1 VIEW COMPARISON:  December 17, 2022 FINDINGS: There is stable dual lead AICD positioning. The heart size and mediastinal contours are within normal limits. Marked severity calcification of the thoracic aorta is noted. There is no evidence of an acute infiltrate, pleural effusion or  pneumothorax. Radiopaque surgical clips are seen overlying the esophageal hiatus. There is mild dextroscoliosis of the mid and lower thoracic spine with multilevel degenerative changes. IMPRESSION: No active cardiopulmonary disease. Electronically Signed   By: Aram Candela M.D.   On: 05/13/2023 17:44   CT CHEST ABDOMEN PELVIS W CONTRAST Result Date: 05/13/2023 CLINICAL DATA:  Found unresponsive, unknown down time EXAM: CT CHEST, ABDOMEN, AND PELVIS WITH CONTRAST CT THORACIC AND LUMBAR SPINE WITH CONTRAST TECHNIQUE: Multidetector CT imaging of the chest, abdomen and pelvis was performed following the standard protocol during bolus administration of intravenous contrast. Multidetector CT imaging of the thoracic and lumbar spine was performed following the standard protocol during bolus administration of intravenous contrast. RADIATION DOSE REDUCTION: This exam was performed according to the departmental dose-optimization program which includes automated exposure control, adjustment of the mA and/or kV according to patient size and/or use of iterative reconstruction technique. CONTRAST:  75mL OMNIPAQUE IOHEXOL 350 MG/ML SOLN COMPARISON:  CT abdomen pelvis, 04/22/2014 FINDINGS: CT CHEST FINDINGS Cardiovascular: Left chest multi lead pacer. Aortic atherosclerosis. Normal heart size. Scattered left and right coronary artery calcifications. No pericardial effusion. Mediastinum/Nodes: No enlarged mediastinal, hilar, or axillary lymph nodes. Thyroid gland, trachea, and esophagus demonstrate no significant findings. Lungs/Pleura: Lungs are clear. No pleural effusion or pneumothorax. Musculoskeletal: No chest wall mass or suspicious osseous lesions identified. CT ABDOMEN PELVIS FINDINGS Hepatobiliary: No focal liver abnormality is seen. Status post cholecystectomy. Similar severe postoperative biliary ductal dilatation. Pancreas: Unremarkable. No pancreatic ductal dilatation or surrounding inflammatory changes. Spleen:  Normal in size without significant abnormality. Adrenals/Urinary Tract: Adrenal glands are unremarkable. Kidneys are normal, without renal calculi, solid lesion, or hydronephrosis. Bladder is unremarkable. Stomach/Bowel: Stomach is within normal limits. Appendix not clearly visualized. No evidence of bowel wall thickening, distention, or inflammatory changes. Vascular/Lymphatic: Aortic atherosclerosis. 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 No enlarged abdominal or pelvic lymph nodes. Reproductive: No mass or other abnormality. Other: No abdominal wall hernia or abnormality. No ascites. Musculoskeletal: No acute osseous findings. CT THORACIC AND LUMBAR SPINE FINDINGS Alignment: Normal thoracic kyphosis. Normal lumbar lordosis. Vertebral bodies: No acute fracture or dislocation. Unchanged mild superior endplate wedge deformity of L2, with approximately 20% height loss. Disc spaces: Generally mild multilevel disc space height loss and osteophytosis throughout the thoracic and lumbar spine, focally moderate at L3-L4. Paraspinous soft tissues: Unremarkable. IMPRESSION: 1. No CT evidence of acute traumatic injury to the chest, abdomen, or pelvis. 2. No acute fracture or dislocation of the thoracic or lumbar spine. Unchanged mild, chronic wedge deformity of L2. 3. Status post cholecystectomy. Similar severe postoperative biliary ductal dilatation. 4. Coronary artery disease. Aortic Atherosclerosis (ICD10-I70.0). Electronically Signed   By: Jearld Lesch M.D.   On: 05/13/2023 17:28   CT L-SPINE NO CHARGE Result Date: 05/13/2023 CLINICAL DATA:  Found unresponsive, unknown down time EXAM:  CT CHEST, ABDOMEN, AND PELVIS WITH CONTRAST CT THORACIC AND LUMBAR SPINE WITH CONTRAST TECHNIQUE: Multidetector CT imaging of the chest, abdomen and pelvis was performed following the standard protocol during bolus administration of intravenous contrast. Multidetector  CT imaging of the thoracic and lumbar spine was performed following the standard protocol during bolus administration of intravenous contrast. RADIATION DOSE REDUCTION: This exam was performed according to the departmental dose-optimization program which includes automated exposure control, adjustment of the mA and/or kV according to patient size and/or use of iterative reconstruction technique. CONTRAST:  75mL OMNIPAQUE IOHEXOL 350 MG/ML SOLN COMPARISON:  CT abdomen pelvis, 04/22/2014 FINDINGS: CT CHEST FINDINGS Cardiovascular: Left chest multi lead pacer. Aortic atherosclerosis. Normal heart size. Scattered left and right coronary artery calcifications. No pericardial effusion. Mediastinum/Nodes: No enlarged mediastinal, hilar, or axillary lymph nodes. Thyroid gland, trachea, and esophagus demonstrate no significant findings. Lungs/Pleura: Lungs are clear. No pleural effusion or pneumothorax. Musculoskeletal: No chest wall mass or suspicious osseous lesions identified. CT ABDOMEN PELVIS FINDINGS Hepatobiliary: No focal liver abnormality is seen. Status post cholecystectomy. Similar severe postoperative biliary ductal dilatation. Pancreas: Unremarkable. No pancreatic ductal dilatation or surrounding inflammatory changes. Spleen: Normal in size without significant abnormality. Adrenals/Urinary Tract: Adrenal glands are unremarkable. Kidneys are normal, without renal calculi, solid lesion, or hydronephrosis. Bladder is unremarkable. Stomach/Bowel: Stomach is within normal limits. Appendix not clearly visualized. No evidence of bowel wall thickening, distention, or inflammatory changes. Vascular/Lymphatic: Aortic atherosclerosis. 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 No enlarged abdominal or pelvic lymph nodes. Reproductive: No mass or other abnormality. Other: No abdominal wall hernia or abnormality. No ascites. Musculoskeletal: No acute osseous  findings. CT THORACIC AND LUMBAR SPINE FINDINGS Alignment: Normal thoracic kyphosis. Normal lumbar lordosis. Vertebral bodies: No acute fracture or dislocation. Unchanged mild superior endplate wedge deformity of L2, with approximately 20% height loss. Disc spaces: Generally mild multilevel disc space height loss and osteophytosis throughout the thoracic and lumbar spine, focally moderate at L3-L4. Paraspinous soft tissues: Unremarkable. IMPRESSION: 1. No CT evidence of acute traumatic injury to the chest, abdomen, or pelvis. 2. No acute fracture or dislocation of the thoracic or lumbar spine. Unchanged mild, chronic wedge deformity of L2. 3. Status post cholecystectomy. Similar severe postoperative biliary ductal dilatation. 4. Coronary artery disease. Aortic Atherosclerosis (ICD10-I70.0). Electronically Signed   By: Jearld Lesch M.D.   On: 05/13/2023 17:28   CT T-SPINE NO CHARGE Result Date: 05/13/2023 CLINICAL DATA:  Found unresponsive, unknown down time EXAM: CT CHEST, ABDOMEN, AND PELVIS WITH CONTRAST CT THORACIC AND LUMBAR SPINE WITH CONTRAST TECHNIQUE: Multidetector CT imaging of the chest, abdomen and pelvis was performed following the standard protocol during bolus administration of intravenous contrast. Multidetector CT imaging of the thoracic and lumbar spine was performed following the standard protocol during bolus administration of intravenous contrast. RADIATION DOSE REDUCTION: This exam was performed according to the departmental dose-optimization program which includes automated exposure control, adjustment of the mA and/or kV according to patient size and/or use of iterative reconstruction technique. CONTRAST:  75mL OMNIPAQUE IOHEXOL 350 MG/ML SOLN COMPARISON:  CT abdomen pelvis, 04/22/2014 FINDINGS: CT CHEST FINDINGS Cardiovascular: Left chest multi lead pacer. Aortic atherosclerosis. Normal heart size. Scattered left and right coronary artery calcifications. No pericardial effusion.  Mediastinum/Nodes: No enlarged mediastinal, hilar, or axillary lymph nodes. Thyroid gland, trachea, and esophagus demonstrate no significant findings. Lungs/Pleura: Lungs are clear. No pleural effusion or pneumothorax. Musculoskeletal: No chest wall mass or suspicious osseous lesions  identified. CT ABDOMEN PELVIS FINDINGS Hepatobiliary: No focal liver abnormality is seen. Status post cholecystectomy. Similar severe postoperative biliary ductal dilatation. Pancreas: Unremarkable. No pancreatic ductal dilatation or surrounding inflammatory changes. Spleen: Normal in size without significant abnormality. Adrenals/Urinary Tract: Adrenal glands are unremarkable. Kidneys are normal, without renal calculi, solid lesion, or hydronephrosis. Bladder is unremarkable. Stomach/Bowel: Stomach is within normal limits. Appendix not clearly visualized. No evidence of bowel wall thickening, distention, or inflammatory changes. Vascular/Lymphatic: Aortic atherosclerosis. 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 No enlarged abdominal or pelvic lymph nodes. Reproductive: No mass or other abnormality. Other: No abdominal wall hernia or abnormality. No ascites. Musculoskeletal: No acute osseous findings. CT THORACIC AND LUMBAR SPINE FINDINGS Alignment: Normal thoracic kyphosis. Normal lumbar lordosis. Vertebral bodies: No acute fracture or dislocation. Unchanged mild superior endplate wedge deformity of L2, with approximately 20% height loss. Disc spaces: Generally mild multilevel disc space height loss and osteophytosis throughout the thoracic and lumbar spine, focally moderate at L3-L4. Paraspinous soft tissues: Unremarkable. IMPRESSION: 1. No CT evidence of acute traumatic injury to the chest, abdomen, or pelvis. 2. No acute fracture or dislocation of the thoracic or lumbar spine. Unchanged mild, chronic wedge deformity of L2. 3. Status post cholecystectomy. Similar  severe postoperative biliary ductal dilatation. 4. Coronary artery disease. Aortic Atherosclerosis (ICD10-I70.0). Electronically Signed   By: Jearld Lesch M.D.   On: 05/13/2023 17:28   CT HEAD WO CONTRAST Result Date: 05/13/2023 CLINICAL DATA:  Found unresponsive, unknown downtime EXAM: CT HEAD WITHOUT CONTRAST CT MAXILLOFACIAL WITHOUT CONTRAST CT CERVICAL SPINE WITHOUT CONTRAST TECHNIQUE: Multidetector CT imaging of the head, cervical spine, and maxillofacial structures were performed using the standard protocol without intravenous contrast. Multiplanar CT image reconstructions of the cervical spine and maxillofacial structures were also generated. RADIATION DOSE REDUCTION: This exam was performed according to the departmental dose-optimization program which includes automated exposure control, adjustment of the mA and/or kV according to patient size and/or use of iterative reconstruction technique. COMPARISON:  04/10/2023 FINDINGS: CT HEAD FINDINGS Brain: No evidence of acute infarction, hemorrhage, hydrocephalus, extra-axial collection or mass lesion/mass effect. Mild periventricular and deep white matter hypodensity. Unchanged left occipital encephalomalacia. Vascular: No hyperdense vessel or unexpected calcification. CT FACIAL BONES FINDINGS Skull: Normal. Negative for fracture or focal lesion. Facial bones: No displaced fractures or dislocations. Sinuses/Orbits: Small air-fluid level in the right maxillary sinus. No overlying fracture. Other: Patient is partially edentulous with erosion of the left maxillary alveolar bone into the left maxillary sinus (series 7, image 31). CT CERVICAL SPINE FINDINGS Alignment: Normal. Skull base and vertebrae: No acute fracture. No primary bone lesion or focal pathologic process. Soft tissues and spinal canal: No prevertebral fluid or swelling. No visible canal hematoma. Disc levels: Focally moderate disc space height loss and osteophytosis of the lower cervical levels  C5-C7 Upper chest: Negative. Other: None. IMPRESSION: 1. No acute intracranial pathology. Small-vessel white matter disease and unchanged left occipital encephalomalacia. 2. No displaced fractures or dislocations of the facial bones. 3. Small air-fluid level in the right maxillary sinus. No overlying fracture. Patient is partially edentulous with erosion of the left maxillary alveolar bone into the left maxillary sinus. Correlate for sinusitis. 4. No fracture or static subluxation of the cervical spine. 5. Focally moderate disc space height loss and osteophytosis of the lower cervical levels. Electronically Signed   By: Jearld Lesch M.D.   On: 05/13/2023 17:20   CT CERVICAL SPINE WO CONTRAST Result Date:  05/13/2023 CLINICAL DATA:  Found unresponsive, unknown downtime EXAM: CT HEAD WITHOUT CONTRAST CT MAXILLOFACIAL WITHOUT CONTRAST CT CERVICAL SPINE WITHOUT CONTRAST TECHNIQUE: Multidetector CT imaging of the head, cervical spine, and maxillofacial structures were performed using the standard protocol without intravenous contrast. Multiplanar CT image reconstructions of the cervical spine and maxillofacial structures were also generated. RADIATION DOSE REDUCTION: This exam was performed according to the departmental dose-optimization program which includes automated exposure control, adjustment of the mA and/or kV according to patient size and/or use of iterative reconstruction technique. COMPARISON:  04/10/2023 FINDINGS: CT HEAD FINDINGS Brain: No evidence of acute infarction, hemorrhage, hydrocephalus, extra-axial collection or mass lesion/mass effect. Mild periventricular and deep white matter hypodensity. Unchanged left occipital encephalomalacia. Vascular: No hyperdense vessel or unexpected calcification. CT FACIAL BONES FINDINGS Skull: Normal. Negative for fracture or focal lesion. Facial bones: No displaced fractures or dislocations. Sinuses/Orbits: Small air-fluid level in the right maxillary sinus. No  overlying fracture. Other: Patient is partially edentulous with erosion of the left maxillary alveolar bone into the left maxillary sinus (series 7, image 31). CT CERVICAL SPINE FINDINGS Alignment: Normal. Skull base and vertebrae: No acute fracture. No primary bone lesion or focal pathologic process. Soft tissues and spinal canal: No prevertebral fluid or swelling. No visible canal hematoma. Disc levels: Focally moderate disc space height loss and osteophytosis of the lower cervical levels C5-C7 Upper chest: Negative. Other: None. IMPRESSION: 1. No acute intracranial pathology. Small-vessel white matter disease and unchanged left occipital encephalomalacia. 2. No displaced fractures or dislocations of the facial bones. 3. Small air-fluid level in the right maxillary sinus. No overlying fracture. Patient is partially edentulous with erosion of the left maxillary alveolar bone into the left maxillary sinus. Correlate for sinusitis. 4. No fracture or static subluxation of the cervical spine. 5. Focally moderate disc space height loss and osteophytosis of the lower cervical levels. Electronically Signed   By: Jearld Lesch M.D.   On: 05/13/2023 17:20   CT MAXILLOFACIAL WO CONTRAST Result Date: 05/13/2023 CLINICAL DATA:  Found unresponsive, unknown downtime EXAM: CT HEAD WITHOUT CONTRAST CT MAXILLOFACIAL WITHOUT CONTRAST CT CERVICAL SPINE WITHOUT CONTRAST TECHNIQUE: Multidetector CT imaging of the head, cervical spine, and maxillofacial structures were performed using the standard protocol without intravenous contrast. Multiplanar CT image reconstructions of the cervical spine and maxillofacial structures were also generated. RADIATION DOSE REDUCTION: This exam was performed according to the departmental dose-optimization program which includes automated exposure control, adjustment of the mA and/or kV according to patient size and/or use of iterative reconstruction technique. COMPARISON:  04/10/2023 FINDINGS: CT HEAD  FINDINGS Brain: No evidence of acute infarction, hemorrhage, hydrocephalus, extra-axial collection or mass lesion/mass effect. Mild periventricular and deep white matter hypodensity. Unchanged left occipital encephalomalacia. Vascular: No hyperdense vessel or unexpected calcification. CT FACIAL BONES FINDINGS Skull: Normal. Negative for fracture or focal lesion. Facial bones: No displaced fractures or dislocations. Sinuses/Orbits: Small air-fluid level in the right maxillary sinus. No overlying fracture. Other: Patient is partially edentulous with erosion of the left maxillary alveolar bone into the left maxillary sinus (series 7, image 31). CT CERVICAL SPINE FINDINGS Alignment: Normal. Skull base and vertebrae: No acute fracture. No primary bone lesion or focal pathologic process. Soft tissues and spinal canal: No prevertebral fluid or swelling. No visible canal hematoma. Disc levels: Focally moderate disc space height loss and osteophytosis of the lower cervical levels C5-C7 Upper chest: Negative. Other: None. IMPRESSION: 1. No acute intracranial pathology. Small-vessel white matter disease and unchanged left occipital encephalomalacia. 2. No  displaced fractures or dislocations of the facial bones. 3. Small air-fluid level in the right maxillary sinus. No overlying fracture. Patient is partially edentulous with erosion of the left maxillary alveolar bone into the left maxillary sinus. Correlate for sinusitis. 4. No fracture or static subluxation of the cervical spine. 5. Focally moderate disc space height loss and osteophytosis of the lower cervical levels. Electronically Signed   By: Jearld Lesch M.D.   On: 05/13/2023 17:20     PHYSICAL EXAM  Temp:  [98.6 F (37 C)-99.8 F (37.7 C)] 99.4 F (37.4 C) (03/08 1155) Pulse Rate:  [59-84] 67 (03/08 1300) Resp:  [16-33] 24 (03/08 1300) BP: (130-168)/(41-133) 145/89 (03/08 1300) SpO2:  [88 %-96 %] 94 % (03/08 1300) Weight:  [62.5 kg] 62.5 kg (03/08  0419)  General - Well nourished, well developed, not in distress  Ophthalmologic - fundi not visualized due to noncooperation.  Cardiovascular - irregularly irregular heart rate and rhythm.  Neuro - sitting in chair, awake, alert, eyes open, global aphasia, nonverbal and not following commands except closed eyes on request. Left gaze preference but able to cross midline, tracking more on the left, blinking to visual threat on the left but not consistent on the right. R facial droop with increased right palpebral fissure. Tongue protrusion not cooperative. LUE at least 3/5, RUE flaccid but withdraw to pain. Bilaterally LEs proximal 3/5 and distal LLE 3/5 and RLE 3-/5. Sensation, coordination and gait not tested.    ASSESSMENT/PLAN Ms. KASSAUNDRA HAIR is a 89 y.o. female with history of HFpEF, pacemaker, A-fib on Xarelto, HLD, HTN, CAD, PVD admitted for right side weakness, aphasia, found down at home. No TNK given due to on Xarelto.    Stroke:  left MCA scattered infarcts embolic secondary to A-fib even on Xarelto CT no acute abnormality, left occipital encephalomalacia CT head and neck left M1 occlusion.  60% stenosis left ICA at the skull base CTP 0/50 cc Status post IR left M1 occlusion with TICI3 MRI multifocal acute infarcts throughout left MCA territory MRA distal left M1 MCA stenosis versus occlusion now patent 2D Echo pending LDL 67 HgbA1c 5.4 Lovenox for VTE prophylaxis Xarelto (rivaroxaban) daily prior to admission, now on aspirin 300 mg suppository daily, will switch to heparin IV. Switch to eliquis once renal function much improved. Ongoing aggressive stroke risk factor management Therapy recommendations: SNF Disposition: Pending  A-fib RVR Cardiology on board on diltiazem IV  Now off amiodarone IV On metoprolol IV 2.5 mg every 6 hours Rate controlled started heparin IV    Respiratory failure, resolved CHF Extubated 05/14/2023, tolerating well Developed AKI and fluid  overload Now on lasix Cardiology and nephrology on board Management per primary team  Hypertension Stable Off Cleviprex BP goal less than 180/105 Long term BP goal normotensive  Hyperlipidemia Home meds: None LDL 67, goal < 70 Statin intolerance No statin needed given LDL at goal  AKI Creatinine 1.24--1.10--1.19--2.34--3.61 Off IV fluid now On lasix Nephrology on board Management per primary team  Dysphagia Did not pass swallow On n.p.o. Cortrak placed Tube feeding initiation per primary team and nephrology   Other Stroke Risk Factors Advanced age HFpEF CAD  Other Active Problems Pacemaker in place PUD Slight leukocytosis, WBC 10.6--8.9--8.8  Hospital day # 4  Patient neurological exam remains stable with global aphasia however she has worsening medical issues with renal failure, heart failure and increasing hypoxia.  Continue IV heparin for now.  Prognosis remains quite poor and she is unlikely to  survive without 24-hour nursing support and likely need nursing home.  Had a long discussion with patient's son and daughter at the bedside regarding the prognosis and encouraged them to think about goals of care and consider palliative care.  Discussed with Dr. Richardson Chiquito.  Greater than 50% time during this 50-minute visit was spent counseling and coordination of care and discussion patient care team and family and answering questions     Delia Heady, MD  Stroke Neurology 05/17/2023 1:36 PM    To contact Stroke Continuity provider, please refer to [email or web address]. After hours, contact General Neurology

## 2023-05-18 DIAGNOSIS — I484 Atypical atrial flutter: Secondary | ICD-10-CM | POA: Diagnosis not present

## 2023-05-18 DIAGNOSIS — Z950 Presence of cardiac pacemaker: Secondary | ICD-10-CM | POA: Diagnosis not present

## 2023-05-18 DIAGNOSIS — G9341 Metabolic encephalopathy: Secondary | ICD-10-CM | POA: Diagnosis not present

## 2023-05-18 DIAGNOSIS — I4819 Other persistent atrial fibrillation: Secondary | ICD-10-CM

## 2023-05-18 DIAGNOSIS — I63412 Cerebral infarction due to embolism of left middle cerebral artery: Secondary | ICD-10-CM | POA: Diagnosis not present

## 2023-05-18 DIAGNOSIS — I4891 Unspecified atrial fibrillation: Secondary | ICD-10-CM | POA: Diagnosis not present

## 2023-05-18 DIAGNOSIS — I5032 Chronic diastolic (congestive) heart failure: Secondary | ICD-10-CM | POA: Diagnosis not present

## 2023-05-18 LAB — MAGNESIUM
Magnesium: 2.5 mg/dL — ABNORMAL HIGH (ref 1.7–2.4)
Magnesium: 2.6 mg/dL — ABNORMAL HIGH (ref 1.7–2.4)

## 2023-05-18 LAB — COMPREHENSIVE METABOLIC PANEL
ALT: 35 U/L (ref 0–44)
AST: 20 U/L (ref 15–41)
Albumin: 3 g/dL — ABNORMAL LOW (ref 3.5–5.0)
Alkaline Phosphatase: 82 U/L (ref 38–126)
Anion gap: 14 (ref 5–15)
BUN: 84 mg/dL — ABNORMAL HIGH (ref 8–23)
CO2: 20 mmol/L — ABNORMAL LOW (ref 22–32)
Calcium: 9.4 mg/dL (ref 8.9–10.3)
Chloride: 113 mmol/L — ABNORMAL HIGH (ref 98–111)
Creatinine, Ser: 4.77 mg/dL — ABNORMAL HIGH (ref 0.44–1.00)
GFR, Estimated: 8 mL/min — ABNORMAL LOW (ref >60)
Glucose, Bld: 132 mg/dL — ABNORMAL HIGH (ref 70–99)
Potassium: 4.4 mmol/L (ref 3.5–5.1)
Sodium: 147 mmol/L — ABNORMAL HIGH (ref 135–145)
Total Bilirubin: 0.6 mg/dL (ref 0.0–1.2)
Total Protein: 5.7 g/dL — ABNORMAL LOW (ref 6.5–8.1)

## 2023-05-18 LAB — CBC WITH DIFFERENTIAL/PLATELET
Abs Immature Granulocytes: 0.08 10*3/uL — ABNORMAL HIGH (ref 0.00–0.07)
Basophils Absolute: 0 10*3/uL (ref 0.0–0.1)
Basophils Relative: 0 %
Eosinophils Absolute: 0.2 10*3/uL (ref 0.0–0.5)
Eosinophils Relative: 3 %
HCT: 33.2 % — ABNORMAL LOW (ref 36.0–46.0)
Hemoglobin: 10.6 g/dL — ABNORMAL LOW (ref 12.0–15.0)
Immature Granulocytes: 1 %
Lymphocytes Relative: 6 %
Lymphs Abs: 0.5 10*3/uL — ABNORMAL LOW (ref 0.7–4.0)
MCH: 31.2 pg (ref 26.0–34.0)
MCHC: 31.9 g/dL (ref 30.0–36.0)
MCV: 97.6 fL (ref 80.0–100.0)
Monocytes Absolute: 0.5 10*3/uL (ref 0.1–1.0)
Monocytes Relative: 7 %
Neutro Abs: 6.6 10*3/uL (ref 1.7–7.7)
Neutrophils Relative %: 83 %
Platelets: 169 10*3/uL (ref 150–400)
RBC: 3.4 MIL/uL — ABNORMAL LOW (ref 3.87–5.11)
RDW: 18 % — ABNORMAL HIGH (ref 11.5–15.5)
WBC: 7.9 10*3/uL (ref 4.0–10.5)
nRBC: 0 % (ref 0.0–0.2)

## 2023-05-18 LAB — GLUCOSE, CAPILLARY
Glucose-Capillary: 123 mg/dL — ABNORMAL HIGH (ref 70–99)
Glucose-Capillary: 128 mg/dL — ABNORMAL HIGH (ref 70–99)
Glucose-Capillary: 128 mg/dL — ABNORMAL HIGH (ref 70–99)
Glucose-Capillary: 139 mg/dL — ABNORMAL HIGH (ref 70–99)
Glucose-Capillary: 140 mg/dL — ABNORMAL HIGH (ref 70–99)
Glucose-Capillary: 141 mg/dL — ABNORMAL HIGH (ref 70–99)
Glucose-Capillary: 146 mg/dL — ABNORMAL HIGH (ref 70–99)

## 2023-05-18 LAB — HEPARIN LEVEL (UNFRACTIONATED): Heparin Unfractionated: 0.33 [IU]/mL (ref 0.30–0.70)

## 2023-05-18 LAB — PHOSPHORUS
Phosphorus: 6.4 mg/dL — ABNORMAL HIGH (ref 2.5–4.6)
Phosphorus: 6.7 mg/dL — ABNORMAL HIGH (ref 2.5–4.6)

## 2023-05-18 MED ORDER — LEVOTHYROXINE SODIUM 25 MCG PO TABS
137.0000 ug | ORAL_TABLET | Freq: Every day | ORAL | Status: DC
  Administered 2023-05-19 – 2023-05-22 (×4): 137 ug
  Filled 2023-05-18 (×4): qty 1

## 2023-05-18 NOTE — Progress Notes (Signed)
 STROKE TEAM PROGRESS NOTE   SUBJECTIVE (INTERVAL HISTORY) No family at bedside.  Patient is lying in bed comfortably.  She is awake alert, able to close eyes on request but not following other commands, nonverbal. AKI got worsen with creatinine 4.77 even with hydration.    OBJECTIVE Temp:  [98.6 F (37 C)-99.8 F (37.7 C)] 99.4 F (37.4 C) (03/08 1155) Pulse Rate:  [59-84] 67 (03/08 1300) Cardiac Rhythm: Ventricular paced;Atrial fibrillation (03/08 1100) Resp:  [16-33] 24 (03/08 1300) BP: (130-168)/(41-133) 145/89 (03/08 1300) SpO2:  [88 %-96 %] 94 % (03/08 1300) Weight:  [62.5 kg] 62.5 kg (03/08 0419)  Recent Labs  Lab 05/16/23 2007 05/16/23 2357 05/17/23 0346 05/17/23 0742 05/17/23 1142  GLUCAP 145* 154* 172* 159* 139*   Recent Labs  Lab 05/13/23 1518 05/13/23 1526 05/13/23 1857 05/14/23 0155 05/14/23 0455 05/15/23 0538 05/16/23 0501 05/17/23 0539  NA 141 143   < > 140 142 140 143 147*  K 4.1 4.1   < > 3.3* 3.7 4.1 3.8 4.1  CL 109 113*  --   --  108 112* 114* 116*  CO2 17*  --   --   --  19* 15* 14* 18*  GLUCOSE 138* 135*  --   --  145* 91 89 135*  BUN 29* 31*  --   --  31* 35* 45* 64*  CREATININE 1.24* 1.10*  --   --  1.19* 2.34* 3.61* 4.49*  CALCIUM 9.5  --   --   --  9.0 8.6* 8.6* 9.3  MG 2.0  --   --   --   --   --  2.0 2.2  PHOS  --   --   --   --   --   --  6.0* 6.6*   < > = values in this interval not displayed.   Recent Labs  Lab 05/13/23 1518 05/14/23 0455 05/15/23 0538 05/16/23 0501 05/17/23 0539  AST 74* 43* 33 28 28  ALT 50* 41 37 37 39  ALKPHOS 98 79 70 73 82  BILITOT 1.3* 0.7 0.6 0.6 0.8  PROT 6.8 5.8* 5.7* 5.4* 6.2*  ALBUMIN 3.8 3.2* 3.5 3.2* 3.3*   Recent Labs  Lab 05/13/23 1518 05/13/23 1526 05/14/23 0155 05/14/23 0455 05/15/23 0538 05/16/23 0501 05/17/23 0539  WBC 14.0*  --   --  10.6* 8.9 8.8 9.4  NEUTROABS  --   --   --  8.9*  --  7.4 8.0*  HGB 15.6*   < > 13.6 13.1 10.7* 10.3* 10.9*  HCT 49.2*   < > 40.0 41.4 34.2*  33.2* 34.2*  MCV 98.0  --   --  97.6 99.7 100.0 96.9  PLT 206  --   --  170 155 138* 164   < > = values in this interval not displayed.   Recent Labs  Lab 05/13/23 1518 05/14/23 0455 05/15/23 1244 05/16/23 0501  CKTOTAL 1,135* 472* 311* 327*   No results for input(s): "LABPROT", "INR" in the last 72 hours.  Recent Labs    05/15/23 0933  COLORURINE YELLOW  LABSPEC >1.046*  PHURINE 5.0  GLUCOSEU NEGATIVE  HGBUR MODERATE*  BILIRUBINUR NEGATIVE  KETONESUR NEGATIVE  PROTEINUR 100*  NITRITE NEGATIVE  LEUKOCYTESUR TRACE*       Component Value Date/Time   CHOL 172 05/14/2023 0455   TRIG 189 (H) 05/14/2023 0455   HDL 67 05/14/2023 0455   CHOLHDL 2.6 05/14/2023 0455   VLDL 38 05/14/2023 0455  LDLCALC 67 05/14/2023 0455   Lab Results  Component Value Date   HGBA1C 5.4 05/14/2023   No results found for: "LABOPIA", "COCAINSCRNUR", "LABBENZ", "AMPHETMU", "THCU", "LABBARB"  Recent Labs  Lab 05/13/23 1518  ETH <10    I have personally reviewed the radiological images below and agree with the radiology interpretations.  DG CHEST PORT 1 VIEW Result Date: 05/17/2023 CLINICAL DATA:  [id number] SOB (shortness of breath) [id number] EXAM: PORTABLE CHEST 1 VIEW COMPARISON:  May 16, 2023 FINDINGS: The cardiomediastinal silhouette is unchanged in contour. The enteric tube courses through the chest to the abdomen beyond the field-of-view. LEFT chest cardiac pacing device. Atherosclerotic calcifications. Favor increased layering RIGHT pleural effusion. Favored small LEFT pleural effusion. No pneumothorax. Bibasilar heterogeneous opacities, favored slightly increased throughout the RIGHT lung. Renal contrast of the LEFT kidney. IMPRESSION: 1. Favor increased layering RIGHT pleural effusion with adjacent atelectasis. 2. Favored small LEFT pleural effusion. 3. Background of interstitial prominence and heterogeneous opacities, possibly reflecting edema versus atypical infection or aspiration. 4.  Retained contrast within the LEFT kidney likely reflecting renal impairment. Electronically Signed   By: Meda Klinefelter M.D.   On: 05/17/2023 10:25   DG CHEST PORT 1 VIEW Result Date: 05/16/2023 CLINICAL DATA:  Shortness of breath.  History of stroke EXAM: PORTABLE CHEST 1 VIEW COMPARISON:  X-ray 05/16/2023 earlier FINDINGS: Enteric tube with tip extending beneath the diaphragm. Left upper chest pacemaker. Enlarged cardiopericardial silhouette with calcified aorta. Central vascular congestion. Persistent lung base opacities and effusions, left greater than right. No pneumothorax. Surgical changes along the upper central abdomen. IMPRESSION: No significant oval change when adjusted for technique. Electronically Signed   By: Karen Kays M.D.   On: 05/16/2023 12:42   DG Abd Portable 1V Result Date: 05/16/2023 CLINICAL DATA:  Feeding tube placement EXAM: PORTABLE ABDOMEN - 1 VIEW COMPARISON:  05/14/2023 x-ray FINDINGS: Interval below prior NG tube. New feeding tube with tip crossing to the right of the spine, likely along the extreme distal stomach or very proximal duodenum. Minimal bowel gas in the upper abdomen. Surgical changes. Overlapping cardiac leads. Limited x-ray for tube placement. New left retrocardiac lung opacity. Small pleural effusions. Please see separate chest x-ray IMPRESSION: Limited x-ray for tube placement has feeding tube crossing to the right of the spine, likely distal stomach or very proximal duodenum Electronically Signed   By: Karen Kays M.D.   On: 05/16/2023 12:41   ECHOCARDIOGRAM COMPLETE Result Date: 05/16/2023    ECHOCARDIOGRAM REPORT   Patient Name:   [facility] Date of Exam: 05/16/2023 Medical Rec #:  [id number]    Height:       63.0 in Accession #:    [id number]   Weight:       137.1 lb Date of Birth:  April 10, 1928    BSA:          1.647 m Patient Age:    89 years     BP:           149/53 mmHg Patient Gender: F            HR:           75 bpm. Exam Location:  Inpatient  Procedure: 2D Echo, Cardiac Doppler and Color Doppler (Both Spectral and Color            Flow Doppler were utilized during procedure). Indications:    Atrial Fibrillation I48.91 Elevated Troponin  History:        Patient has prior history  of Echocardiogram examinations, most                 recent 04/04/2022. CHF, Stroke; Aortic Valve Disease.  Sonographer:    Webb Laws Referring Phys: [id number] SUBRINA SUNDIL IMPRESSIONS  1. Left ventricular ejection fraction, by estimation, is 60 to 65%. The left ventricle has normal function. The left ventricle has no regional wall motion abnormalities. There is mild left ventricular hypertrophy. Left ventricular diastolic parameters are indeterminate.  2. Pacing leads seen in RA and RV. Right ventricular systolic function is normal. The right ventricular size is normal.  3. Left atrial size was severely dilated.  4. Right atrial size was severely dilated.  5. The mitral valve is degenerative. Trivial mitral valve regurgitation. No evidence of mitral stenosis. Severe mitral annular calcification.  6. The aortic valve was not well visualized. There is moderate calcification of the aortic valve. There is moderate thickening of the aortic valve. Aortic valve regurgitation is mild. Moderate aortic valve stenosis.  7. The inferior vena cava is normal in size with greater than 50% respiratory variability, suggesting right atrial pressure of 3 mmHg. FINDINGS  Left Ventricle: Left ventricular ejection fraction, by estimation, is 60 to 65%. The left ventricle has normal function. The left ventricle has no regional wall motion abnormalities. Strain was performed and the global longitudinal strain is indeterminate. The left ventricular internal cavity size was normal in size. There is mild left ventricular hypertrophy. Left ventricular diastolic parameters are indeterminate. Right Ventricle: Pacing leads seen in RA and RV. The right ventricular size is normal. No increase in right  ventricular wall thickness. Right ventricular systolic function is normal. Left Atrium: Left atrial size was severely dilated. Right Atrium: Right atrial size was severely dilated. Pericardium: There is no evidence of pericardial effusion. Mitral Valve: The mitral valve is degenerative in appearance. There is moderate thickening of the mitral valve leaflet(s). There is moderate calcification of the mitral valve leaflet(s). Severe mitral annular calcification. Trivial mitral valve regurgitation. No evidence of mitral valve stenosis. Tricuspid Valve: The tricuspid valve is normal in structure. Tricuspid valve regurgitation is mild . No evidence of tricuspid stenosis. Aortic Valve: The aortic valve was not well visualized. There is moderate calcification of the aortic valve. There is moderate thickening of the aortic valve. Aortic valve regurgitation is mild. Aortic regurgitation PHT measures 371 msec. Moderate aortic  stenosis is present. Aortic valve mean gradient measures 22.0 mmHg. Aortic valve peak gradient measures 39.2 mmHg. Aortic valve area, by VTI measures 0.54 cm. Pulmonic Valve: The pulmonic valve was normal in structure. Pulmonic valve regurgitation is trivial. No evidence of pulmonic stenosis. Aorta: The aortic root is normal in size and structure. Venous: The inferior vena cava is normal in size with greater than 50% respiratory variability, suggesting right atrial pressure of 3 mmHg. IAS/Shunts: No atrial level shunt detected by color flow Doppler. Additional Comments: 3D was performed not requiring image post processing on an independent workstation and was indeterminate. A device lead is visualized.  LEFT VENTRICLE PLAX 2D LVIDd:         3.40 cm   Diastology LVIDs:         2.40 cm   LV e' medial:    4.13 cm/s LV PW:         1.00 cm   LV E/e' medial:  53.0 LV IVS:        1.10 cm   LV e' lateral:   7.07 cm/s LVOT diam:  1.40 cm   LV E/e' lateral: 31.0 LV SV:         34 LV SV Index:   21 LVOT Area:      1.54 cm  RIGHT VENTRICLE            IVC RV Basal diam:  3.70 cm    IVC diam: 2.00 cm RV S prime:     7.40 cm/s TAPSE (M-mode): 2.0 cm LEFT ATRIUM             Index        RIGHT ATRIUM           Index LA diam:        4.60 cm 2.79 cm/m   RA Area:     13.30 cm LA Vol (A2C):   55.3 ml 33.57 ml/m  RA Volume:   29.60 ml  17.97 ml/m LA Vol (A4C):   46.6 ml 28.29 ml/m LA Biplane Vol: 51.6 ml 31.33 ml/m  AORTIC VALVE AV Area (Vmax):    0.50 cm AV Area (Vmean):   0.49 cm AV Area (VTI):     0.54 cm AV Vmax:           313.00 cm/s AV Vmean:          219.000 cm/s AV VTI:            0.627 m AV Peak Grad:      39.2 mmHg AV Mean Grad:      22.0 mmHg LVOT Vmax:         101.00 cm/s LVOT Vmean:        70.000 cm/s LVOT VTI:          0.220 m LVOT/AV VTI ratio: 0.35 AI PHT:            371 msec  AORTA Ao Root diam: 2.40 cm Ao Asc diam:  2.60 cm MITRAL VALVE MV Area (PHT): 4.12 cm     SHUNTS MV Decel Time: 184 msec     Systemic VTI:  0.22 m MV E velocity: 219.00 cm/s  Systemic Diam: 1.40 cm Charlton Haws MD Electronically signed by Charlton Haws MD Signature Date/Time: 05/16/2023/12:00:31 PM    Final    DG Chest Port 1 View Result Date: 05/16/2023 CLINICAL DATA:  [id number].  Shortness of breath. EXAM: PORTABLE CHEST 1 VIEW COMPARISON:  Portable chest yesterday at 10:44 a.m. FINDINGS: 3:40 a.m. Left chest dual lead pacing system and wire insertions are unchanged. Stable cardiomegaly. There is persistent perihilar vascular congestion and mild interstitial edema with small but increasing pleural effusions. There is increased opacity in the lung bases which could be due to atelectasis or consolidation. The remaining lungs are clear. The mediastinum is stable. Aortic atherosclerosis. No new osseous findings.  Osteopenia.  Thoracic spondylosis. IMPRESSION: 1. Persistent perihilar vascular congestion and mild interstitial edema with small but increasing pleural effusions. 2. Increased opacity in the lung bases which could be due to  atelectasis or consolidation. 3. Stable cardiomegaly. Electronically Signed   By: Almira Bar M.D.   On: 05/16/2023 06:43   US RENAL Result Date: 05/15/2023 CLINICAL DATA:  Acute renal in EXAM: RENAL / URINARY TRACT ULTRASOUND COMPLETE COMPARISON:  None Available. FINDINGS: Right Kidney: Renal measurements: 7.5 x 3.9 x 3.6 cm. = volume: 54.9 mL. Echogenicity within normal limits. No mass or hydronephrosis visualized. Large upper pole simple cyst is noted measuring 4.2 cm. Left Kidney: Renal measurements: 8.6 x 4.0 x 4.2 cm. = volume: 75.6 mL. Echogenicity  within normal limits. No mass or hydronephrosis visualized. Bladder: Appears normal for degree of bladder distention. Other: None. IMPRESSION: Right simple renal cyst.  No follow-up is recommended. Electronically Signed   By: Alcide Clever M.D.   On: 05/15/2023 23:11   DG CHEST PORT 1 VIEW Result Date: 05/15/2023 CLINICAL DATA:  Wheezing. EXAM: PORTABLE CHEST 1 VIEW COMPARISON:  Radiographs 05/14/2023 and 05/13/2023.  CT 05/13/2023. FINDINGS: 1044 hours. Interval extubation and removal of the enteric tube. Left subclavian pacemaker leads appear unchanged, projecting over the right atrium and right ventricle. The heart size and mediastinal contours are stable. However, there are new diffuse ground-glass opacities bilaterally with septal thickening and small left greater than right pleural effusions. No evidence of pneumothorax. The bones appear unchanged. Asymmetric glenohumeral degenerative changes on the right. IMPRESSION: New diffuse ground-glass opacities bilaterally with septal thickening and small left greater than right pleural effusions, most consistent with pulmonary edema/congestive heart failure. Electronically Signed   By: Carey Bullocks M.D.   On: 05/15/2023 14:44   IR CT Head Ltd Result Date: 05/15/2023 INDICATION: New onset right-sided hemiplegia and aphasia. 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 EXAM: 1.  EMERGENT LARGE VESSEL OCCLUSION THROMBOLYSIS (anterior CIRCULATION) COMPARISON:  CT angiogram of the head and neck of May 13, 2023. MEDICATIONS: No antibiotic was administered within 1 hour of the procedure. ANESTHESIA/SEDATION: General anesthesia. CONTRAST:  Omnipaque 300 approximately 120 mL. FLUOROSCOPY TIME:  Fluoroscopy Time: 30 minutes 0 seconds (7419 mGy). COMPLICATIONS: None immediate. TECHNIQUE: Following a full explanation of the procedure along with the potential associated complications, an informed witnessed consent was obtained. The risks of intracranial hemorrhage of 10%, worsening neurological deficit, ventilator dependency, death and inability to revascularize were all reviewed in detail with the patient's son. The patient was then put under general anesthesia by the Department of Anesthesiology at [facility]. The right groin was prepped and draped in the usual sterile fashion. Thereafter using modified Seldinger technique, transfemoral access into the right common femoral artery was obtained without difficulty. Over an 0.035 inch guidewire an 8 French 25 cm Pinnacle sheath was inserted. Through this, and also over an 0.035 inch guidewire a combination of a support 088 100 cm Zoom catheter and a 125 cm Simmons 2 support catheter was advanced to the aortic arch region and selectively positioned in the left common carotid artery. Arteriograms were then performed centered extra cranially and intracranially. FINDINGS: Left common carotid arteriogram demonstrates the left external carotid artery and its major branches to be widely patent. The left internal carotid artery at the bulb to the cranial skull base is widely patent. The petrous, the cavernous and the supraclinoid segments demonstrate wide patency. The left anterior cerebral artery opacifies into the capillary and venous phases. The left middle cerebral artery demonstrates patency of the proximal 2/3 of the M1 segment. The distal 1/3  demonstrates occlusion. PROCEDURE: Through the Zoom 088 100 cm support catheter in the cervical petrous junction, a combination of an 071 132 cm Zoom with an 021 150 cm Phenom microcatheter was advanced over an 018 inch standard micro guidewire with a moderate J configuration to the supraclinoid left ICA. The micro guidewire was then advanced to the distal left M1 segment followed by the microcatheter followed by the 071 Zoom aspiration catheter. The Zoom aspiration catheter was then engaged into the occluded distal M1 segment. Advancement of the microcatheter through the occluded distal M1 segment into the inferior over the superior divisions  was met with significant resistance. The microcatheter and micro guidewire were removed. Constant aspiration was then applied at the hub of the Zoom aspiration catheter for approximately 2 minutes. Thereafter, with proximal aspiration, the combination of the Zoom aspiration catheter was retrieved and removed. A control arteriogram performed through the 088 Zoom support catheter demonstrated improved recanalization of the distal M1 with a few minor opacifying distal to this. A second pass was then made using an 062 134 cm Penumbra aspiration catheter with an 043 distal aspiration over an 018 inch micro guidewire. The microcatheter was again advanced to the mildly recanalized distal M1 segment followed by the microcatheter and the 062 Zoom aspiration catheter which was advanced into the occluded left M1 segment. Advancement of the micro guidewire through the firm clot was met with resistance. The microcatheter and micro guidewire were removed. Constant aspiration was then applied at the hub of the 062 Penumbra aspiration catheter for 2 minutes. A control arteriogram performed through the Zoom support catheter guidewire now demonstrated partial revascularization of the inferior division. A third pass was then made using the 062 Penumbra aspiration catheter with an 021 150 cm  Phenom microcatheter which was advanced over an 018 inch micro guidewire with a moderate J configuration to the distal M1 segment. The micro guidewire was then gently advanced without difficulty through the inferior division of the M2 M3 region followed by the microcatheter. The micro guidewire was removed. Good aspiration obtained from the hub of the microcatheter. A gentle control arteriogram performed through this demonstrates safe positioning of the tip of the microcatheter. A 4 mm x 40 mm Solitaire X retrieval device was then advanced to the distal end of the microcatheter and deployed in the usual manner such that the proximal portion covered the distal M1 segment. The 062 aspiration catheter was now engaged into the origin of the inferior division. A constant aspiration was applied at the hub of the 062 aspiration catheter for approximately 2 minutes. Thereafter the combination of the retrieval device and the aspiration catheter was retrieved and removed. A few more chunks of clot were noted in the aspirate. A control arteriogram performed through the support catheter in the left internal carotid artery now demonstrated revascularization of the left MCA branches achieving a TICI 3 revascularization. Also noted at this time was a small filling defect just distal to the origin of the middle branch of the MCA bifurcation. This also demonstrated moderate spasm at its origin which responded to 4 aliquots of 25 mcg of nitroglycerin intra-arterially. The small filling defect in the proximal aspect of the superior division was then cleared with another pass with the 062 Penumbra aspiration catheter advanced in combination with a 150 cm Phenom micro catheter over an 018 inch micro guidewire. Aspiration was applied right at the face of the filling defect for 30 seconds. A control arteriogram performed following removal of the aspiration catheters demonstrated clearance of the filling defect with mild residual vasospasm  which again responded to 25 mcg of nitroglycerin intra-arterially. A TICI 3 revascularization was achieved. Patency was maintained of the left anterior cerebral artery distribution. A final control arteriogram performed through the Zoom support catheter in the distal left common carotid artery demonstrated patency of the left internal carotid artery extra cranially and intracranially. The intracranial vasculature remained widely patent without any filling defects. An 8 French Angio-Seal closure device was deployed for hemostasis at the at the right groin puncture site. Distal pulses remained present in both feet unchanged compared to  prior to the procedure. A flat panel CT of the brain demonstrated no evidence of intracranial hemorrhage. The patient was left intubated due to patient's initial neurologic condition. She was then transferred to the neuro ICU for post revascularization care. IMPRESSION: Status post endovascular complete revascularization of occluded distal left middle cerebral artery M1 segment, and the proximal portions of the superior and the inferior divisions with 3 passes of contact aspiration and 1 pass with a 4 mm x 40 mm Solitaire X retrieval device and contact aspiration achieving a TICI 3 revascularization. PLAN: Follow-up as per referring MD. Electronically Signed   By: Julieanne Cotton M.D.   On: 05/15/2023 08:27   IR PERCUTANEOUS ART THROMBECTOMY/INFUSION INTRACRANIAL INC DIAG ANGIO Result Date: 05/15/2023 INDICATION: New onset right-sided hemiplegia and aphasia. 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 EXAM: 1. EMERGENT LARGE VESSEL OCCLUSION THROMBOLYSIS (anterior CIRCULATION) COMPARISON:  CT angiogram of the head and neck of May 13, 2023. MEDICATIONS: No antibiotic was administered within 1 hour of the procedure. ANESTHESIA/SEDATION: General anesthesia. CONTRAST:  Omnipaque 300 approximately 120 mL. FLUOROSCOPY TIME:  Fluoroscopy Time: 30 minutes 0  seconds (7419 mGy). COMPLICATIONS: None immediate. TECHNIQUE: Following a full explanation of the procedure along with the potential associated complications, an informed witnessed consent was obtained. The risks of intracranial hemorrhage of 10%, worsening neurological deficit, ventilator dependency, death and inability to revascularize were all reviewed in detail with the patient's son. The patient was then put under general anesthesia by the Department of Anesthesiology at [facility]. The right groin was prepped and draped in the usual sterile fashion. Thereafter using modified Seldinger technique, transfemoral access into the right common femoral artery was obtained without difficulty. Over an 0.035 inch guidewire an 8 French 25 cm Pinnacle sheath was inserted. Through this, and also over an 0.035 inch guidewire a combination of a support 088 100 cm Zoom catheter and a 125 cm Simmons 2 support catheter was advanced to the aortic arch region and selectively positioned in the left common carotid artery. Arteriograms were then performed centered extra cranially and intracranially. FINDINGS: Left common carotid arteriogram demonstrates the left external carotid artery and its major branches to be widely patent. The left internal carotid artery at the bulb to the cranial skull base is widely patent. The petrous, the cavernous and the supraclinoid segments demonstrate wide patency. The left anterior cerebral artery opacifies into the capillary and venous phases. The left middle cerebral artery demonstrates patency of the proximal 2/3 of the M1 segment. The distal 1/3 demonstrates occlusion. PROCEDURE: Through the Zoom 088 100 cm support catheter in the cervical petrous junction, a combination of an 071 132 cm Zoom with an 021 150 cm Phenom microcatheter was advanced over an 018 inch standard micro guidewire with a moderate J configuration to the supraclinoid left ICA. The micro guidewire was then advanced to  the distal left M1 segment followed by the microcatheter followed by the 071 Zoom aspiration catheter. The Zoom aspiration catheter was then engaged into the occluded distal M1 segment. Advancement of the microcatheter through the occluded distal M1 segment into the inferior over the superior divisions was met with significant resistance. The microcatheter and micro guidewire were removed. Constant aspiration was then applied at the hub of the Zoom aspiration catheter for approximately 2 minutes. Thereafter, with proximal aspiration, the combination of the Zoom aspiration catheter was retrieved and removed. A control arteriogram performed through the 088 Zoom support catheter demonstrated improved recanalization  of the distal M1 with a few minor opacifying distal to this. A second pass was then made using an 062 134 cm Penumbra aspiration catheter with an 043 distal aspiration over an 018 inch micro guidewire. The microcatheter was again advanced to the mildly recanalized distal M1 segment followed by the microcatheter and the 062 Zoom aspiration catheter which was advanced into the occluded left M1 segment. Advancement of the micro guidewire through the firm clot was met with resistance. The microcatheter and micro guidewire were removed. Constant aspiration was then applied at the hub of the 062 Penumbra aspiration catheter for 2 minutes. A control arteriogram performed through the Zoom support catheter guidewire now demonstrated partial revascularization of the inferior division. A third pass was then made using the 062 Penumbra aspiration catheter with an 021 150 cm Phenom microcatheter which was advanced over an 018 inch micro guidewire with a moderate J configuration to the distal M1 segment. The micro guidewire was then gently advanced without difficulty through the inferior division of the M2 M3 region followed by the microcatheter. The micro guidewire was removed. Good aspiration obtained from the hub of  the microcatheter. A gentle control arteriogram performed through this demonstrates safe positioning of the tip of the microcatheter. A 4 mm x 40 mm Solitaire X retrieval device was then advanced to the distal end of the microcatheter and deployed in the usual manner such that the proximal portion covered the distal M1 segment. The 062 aspiration catheter was now engaged into the origin of the inferior division. A constant aspiration was applied at the hub of the 062 aspiration catheter for approximately 2 minutes. Thereafter the combination of the retrieval device and the aspiration catheter was retrieved and removed. A few more chunks of clot were noted in the aspirate. A control arteriogram performed through the support catheter in the left internal carotid artery now demonstrated revascularization of the left MCA branches achieving a TICI 3 revascularization. Also noted at this time was a small filling defect just distal to the origin of the middle branch of the MCA bifurcation. This also demonstrated moderate spasm at its origin which responded to 4 aliquots of 25 mcg of nitroglycerin intra-arterially. The small filling defect in the proximal aspect of the superior division was then cleared with another pass with the 062 Penumbra aspiration catheter advanced in combination with a 150 cm Phenom micro catheter over an 018 inch micro guidewire. Aspiration was applied right at the face of the filling defect for 30 seconds. A control arteriogram performed following removal of the aspiration catheters demonstrated clearance of the filling defect with mild residual vasospasm which again responded to 25 mcg of nitroglycerin intra-arterially. A TICI 3 revascularization was achieved. Patency was maintained of the left anterior cerebral artery distribution. A final control arteriogram performed through the Zoom support catheter in the distal left common carotid artery demonstrated patency of the left internal carotid  artery extra cranially and intracranially. The intracranial vasculature remained widely patent without any filling defects. An 8 French Angio-Seal closure device was deployed for hemostasis at the at the right groin puncture site. Distal pulses remained present in both feet unchanged compared to prior to the procedure. A flat panel CT of the brain demonstrated no evidence of intracranial hemorrhage. The patient was left intubated due to patient's initial neurologic condition. She was then transferred to the neuro ICU for post revascularization care. IMPRESSION: Status post endovascular complete revascularization of occluded distal left middle cerebral artery M1 segment, and the  proximal portions of the superior and the inferior divisions with 3 passes of contact aspiration and 1 pass with a 4 mm x 40 mm Solitaire X retrieval device and contact aspiration achieving a TICI 3 revascularization. PLAN: Follow-up as per referring MD. Electronically Signed   By: Julieanne Cotton M.D.   On: 05/15/2023 08:27   MR BRAIN WO CONTRAST Result Date: 05/14/2023 CLINICAL DATA:  Stroke, follow up EXAM: MRI HEAD WITHOUT CONTRAST MRA HEAD WITHOUT CONTRAST TECHNIQUE: Multiplanar, multi-echo pulse sequences of the brain and surrounding structures were acquired without intravenous contrast. Angiographic images of the Circle of Willis were acquired using MRA technique without intravenous contrast. COMPARISON:  CTA head/neck May 13, 2023. FINDINGS: MRI HEAD FINDINGS Brain: Many acute Infarcts throughout the left MCA territory. Associated edema without substantial mass effect. No midline shift. Remote left PCA territory infarct. Patchy T2/FLAIR hyperintensities the white matter, compatible with chronic microvascular ischemic disease. Remote left cerebellar infarct. No mass lesion. Cerebral atrophy. Vascular: See below. Skull and upper cervical spine: Normal marrow signal. Sinuses/Orbits: Mild paranasal sinus mucosal thickening. No acute  orbital findings. Other: No mastoid effusions. MRA HEAD FINDINGS Anterior circulation: Bilateral intracranial ICAs, MCAs, and ACAs are patent without proximal hemodynamically significant stenosis. Previously seen distal left M1 MCA stenosis versus occlusion now appears patent. Posterior circulation: Bilateral intradural vertebral arteries, basilar artery and bilateral posterior cerebral arteries are patent without proximal hemodynamically significant stenosis. IMPRESSION: 1. Multiple acute infarcts throughout the left MCA territory. 2. Previously seen distal left M1 MCA stenosis versus occlusion now appears patent. No large vessel occlusion or proximal hemodynamically significant stenosis. Electronically Signed   By: Feliberto Harts M.D.   On: 05/14/2023 19:31   MR ANGIO HEAD WO CONTRAST Result Date: 05/14/2023 CLINICAL DATA:  Stroke, follow up EXAM: MRI HEAD WITHOUT CONTRAST MRA HEAD WITHOUT CONTRAST TECHNIQUE: Multiplanar, multi-echo pulse sequences of the brain and surrounding structures were acquired without intravenous contrast. Angiographic images of the Circle of Willis were acquired using MRA technique without intravenous contrast. COMPARISON:  CTA head/neck May 13, 2023. FINDINGS: MRI HEAD FINDINGS Brain: Many acute Infarcts throughout the left MCA territory. Associated edema without substantial mass effect. No midline shift. Remote left PCA territory infarct. Patchy T2/FLAIR hyperintensities the white matter, compatible with chronic microvascular ischemic disease. Remote left cerebellar infarct. No mass lesion. Cerebral atrophy. Vascular: See below. Skull and upper cervical spine: Normal marrow signal. Sinuses/Orbits: Mild paranasal sinus mucosal thickening. No acute orbital findings. Other: No mastoid effusions. MRA HEAD FINDINGS Anterior circulation: Bilateral intracranial ICAs, MCAs, and ACAs are patent without proximal hemodynamically significant stenosis. Previously seen distal left M1 MCA  stenosis versus occlusion now appears patent. Posterior circulation: Bilateral intradural vertebral arteries, basilar artery and bilateral posterior cerebral arteries are patent without proximal hemodynamically significant stenosis. IMPRESSION: 1. Multiple acute infarcts throughout the left MCA territory. 2. Previously seen distal left M1 MCA stenosis versus occlusion now appears patent. No large vessel occlusion or proximal hemodynamically significant stenosis. Electronically Signed   By: Feliberto Harts M.D.   On: 05/14/2023 19:31   DG Abd Portable 1V Result Date: 05/14/2023 CLINICAL DATA:  Check gastric catheter placement EXAM: PORTABLE ABDOMEN - 1 VIEW COMPARISON:  None Available. FINDINGS: Gastric catheter is noted within the stomach. Contrast is noted within the kidneys bilaterally. No free air is seen. IMPRESSION: Gastric catheter in the stomach. Electronically Signed   By: Alcide Clever M.D.   On: 05/14/2023 02:54   Portable Chest x-ray Result Date: 05/14/2023 CLINICAL DATA:  Status  post intubation EXAM: PORTABLE CHEST 1 VIEW COMPARISON:  Film from the previous day. FINDINGS: Cardiac shadow is within normal limits. Pacing device is again seen. Endotracheal tube and gastric catheter are noted in satisfactory position. Lungs are free of acute infiltrate or effusion. IMPRESSION: No active disease. Electronically Signed   By: Alcide Clever M.D.   On: 05/14/2023 02:53   CT ANGIO HEAD NECK W WO CM W PERF Result Date: 05/13/2023 CLINICAL DATA:  Neuro deficit, acute, stroke suspected EXAM: CT ANGIOGRAPHY HEAD AND NECK CT PERFUSION BRAIN TECHNIQUE: Multidetector CT imaging of the head and neck was performed using the standard protocol during bolus administration of intravenous contrast. Multiplanar CT image reconstructions and MIPs were obtained to evaluate the vascular anatomy. Carotid stenosis measurements (when applicable) are obtained utilizing NASCET criteria, using the distal internal carotid diameter as  the denominator. Multiphase CT imaging of the brain was performed following IV bolus contrast injection. Subsequent parametric perfusion maps were calculated using RAPID software. RADIATION DOSE REDUCTION: This exam was performed according to the departmental dose-optimization program which includes automated exposure control, adjustment of the mA and/or kV according to patient size and/or use of iterative reconstruction technique. CONTRAST:  OMNIPAQUE IOHEXOL 350 MG/ML SOLN COMPARISON:  Same day CT head. FINDINGS: CTA NECK FINDINGS Aortic arch: Atherosclerosis.  Great vessel origins are patent. Right carotid system: Atherosclerosis at the carotid bifurcation without greater than 50% stenosis. Also, atherosclerosis at the skull base with approximately 60% stenosis. Left carotid system: Atherosclerosis without greater than 50% stenosis. Vertebral arteries: Left dominant. No significant (greater than 50%) stenosis. Skeleton: No acute abnormality on limited assessment. Other neck: No acute abnormality on limited assessment. Upper chest: Visualized lung apices are clear. Review of the MIP images confirms the above findings CTA HEAD FINDINGS Anterior circulation: Bilateral intracranial ICAs are patent without significant stenosis. Right MCA and bilateral ACAs are patent without proximal hemodynamically significant stenosis. Distal left M1 MCA occlusion versus severe stenosis. Asymmetric opacification of the left MCA vessels more distally. Posterior circulation: Bilateral intradural vertebral arteries, basilar artery and bilateral posterior cerebral arteries are patent without proximal hemodynamically significant stenosis. Venous sinuses: As permitted by contrast timing, patent. Review of the MIP images confirms the above findings CT Brain Perfusion Findings: CBF (<30%) Volume: 0mL Perfusion (Tmax>6.0s) volume: 50mL Mismatch Volume: 50mL Infarction Location:None identified. IMPRESSION: 1. Occluded versus severely  stenotic distal left M1 MCA with asymmetrically diminished distal left MCA opacification. Recommend code stroke activation and emergent neurology consultation. 2. Approximately 50 mL of left MCA penumbra without evidence of core infarct by CT perfusion. 3. Approximately 60% stenosis of the right ICA at the skull base. Findings and recommendations discussed with Dr. Wilkie Aye via telephone at Dr. Wilkie Aye. Electronically Signed   By: Feliberto Harts M.D.   On: 05/13/2023 21:40   DG Shoulder Right Portable Result Date: 05/13/2023 CLINICAL DATA:  Status post trauma. EXAM: RIGHT SHOULDER - 1 VIEW COMPARISON:  None Available. FINDINGS: Limited study secondary to numerous overlying radiopaque cardiac lead wires. There is no evidence of acute fracture or dislocation. Chronic changes are seen along the greater tubercle and inferior medial aspect of the right humeral. There are moderate severity degenerative changes. Soft tissues are unremarkable. IMPRESSION: 1. Limited study without evidence of acute fracture or dislocation. 2. Moderate severity chronic and degenerative changes. Electronically Signed   By: Aram Candela M.D.   On: 05/13/2023 17:46   DG Pelvis Portable Result Date: 05/13/2023 CLINICAL DATA:  Status post trauma. EXAM: PORTABLE PELVIS  1-2 VIEWS COMPARISON:  March 05, 2016 FINDINGS: There is no evidence of pelvic fracture or diastasis. No pelvic bone lesions are seen. IMPRESSION: Negative. Electronically Signed   By: Aram Candela M.D.   On: 05/13/2023 17:45   DG Chest Port 1 View Result Date: 05/13/2023 CLINICAL DATA:  Status post trauma. EXAM: PORTABLE CHEST 1 VIEW COMPARISON:  December 17, 2022 FINDINGS: There is stable dual lead AICD positioning. The heart size and mediastinal contours are within normal limits. Marked severity calcification of the thoracic aorta is noted. There is no evidence of an acute infiltrate, pleural effusion or pneumothorax. Radiopaque surgical clips are seen overlying  the esophageal hiatus. There is mild dextroscoliosis of the mid and lower thoracic spine with multilevel degenerative changes. IMPRESSION: No active cardiopulmonary disease. Electronically Signed   By: Aram Candela M.D.   On: 05/13/2023 17:44   CT CHEST ABDOMEN PELVIS W CONTRAST Result Date: 05/13/2023 CLINICAL DATA:  Found unresponsive, unknown down time EXAM: CT CHEST, ABDOMEN, AND PELVIS WITH CONTRAST CT THORACIC AND LUMBAR SPINE WITH CONTRAST TECHNIQUE: Multidetector CT imaging of the chest, abdomen and pelvis was performed following the standard protocol during bolus administration of intravenous contrast. Multidetector CT imaging of the thoracic and lumbar spine was performed following the standard protocol during bolus administration of intravenous contrast. RADIATION DOSE REDUCTION: This exam was performed according to the departmental dose-optimization program which includes automated exposure control, adjustment of the mA and/or kV according to patient size and/or use of iterative reconstruction technique. CONTRAST:  75mL OMNIPAQUE IOHEXOL 350 MG/ML SOLN COMPARISON:  CT abdomen pelvis, 04/22/2014 FINDINGS: CT CHEST FINDINGS Cardiovascular: Left chest multi lead pacer. Aortic atherosclerosis. Normal heart size. Scattered left and right coronary artery calcifications. No pericardial effusion. Mediastinum/Nodes: No enlarged mediastinal, hilar, or axillary lymph nodes. Thyroid gland, trachea, and esophagus demonstrate no significant findings. Lungs/Pleura: Lungs are clear. No pleural effusion or pneumothorax. Musculoskeletal: No chest wall mass or suspicious osseous lesions identified. CT ABDOMEN PELVIS FINDINGS Hepatobiliary: No focal liver abnormality is seen. Status post cholecystectomy. Similar severe postoperative biliary ductal dilatation. Pancreas: Unremarkable. No pancreatic ductal dilatation or surrounding inflammatory changes. Spleen: Normal in size without significant abnormality.  Adrenals/Urinary Tract: Adrenal glands are unremarkable. Kidneys are normal, without renal calculi, solid lesion, or hydronephrosis. Bladder is unremarkable. Stomach/Bowel: Stomach is within normal limits. Appendix not clearly visualized. No evidence of bowel wall thickening, distention, or inflammatory changes. Vascular/Lymphatic: Aortic atherosclerosis. 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 No enlarged abdominal or pelvic lymph nodes. Reproductive: No mass or other abnormality. Other: No abdominal wall hernia or abnormality. No ascites. Musculoskeletal: No acute osseous findings. CT THORACIC AND LUMBAR SPINE FINDINGS Alignment: Normal thoracic kyphosis. Normal lumbar lordosis. Vertebral bodies: No acute fracture or dislocation. Unchanged mild superior endplate wedge deformity of L2, with approximately 20% height loss. Disc spaces: Generally mild multilevel disc space height loss and osteophytosis throughout the thoracic and lumbar spine, focally moderate at L3-L4. Paraspinous soft tissues: Unremarkable. IMPRESSION: 1. No CT evidence of acute traumatic injury to the chest, abdomen, or pelvis. 2. No acute fracture or dislocation of the thoracic or lumbar spine. Unchanged mild, chronic wedge deformity of L2. 3. Status post cholecystectomy. Similar severe postoperative biliary ductal dilatation. 4. Coronary artery disease. Aortic Atherosclerosis (ICD10-I70.0). Electronically Signed   By: Jearld Lesch M.D.   On: 05/13/2023 17:28   CT L-SPINE NO CHARGE Result Date: 05/13/2023 CLINICAL DATA:  Found unresponsive, unknown down time EXAM:  CT CHEST, ABDOMEN, AND PELVIS WITH CONTRAST CT THORACIC AND LUMBAR SPINE WITH CONTRAST TECHNIQUE: Multidetector CT imaging of the chest, abdomen and pelvis was performed following the standard protocol during bolus administration of intravenous contrast. Multidetector CT imaging of the thoracic and lumbar spine was  performed following the standard protocol during bolus administration of intravenous contrast. RADIATION DOSE REDUCTION: This exam was performed according to the departmental dose-optimization program which includes automated exposure control, adjustment of the mA and/or kV according to patient size and/or use of iterative reconstruction technique. CONTRAST:  75mL OMNIPAQUE IOHEXOL 350 MG/ML SOLN COMPARISON:  CT abdomen pelvis, 04/22/2014 FINDINGS: CT CHEST FINDINGS Cardiovascular: Left chest multi lead pacer. Aortic atherosclerosis. Normal heart size. Scattered left and right coronary artery calcifications. No pericardial effusion. Mediastinum/Nodes: No enlarged mediastinal, hilar, or axillary lymph nodes. Thyroid gland, trachea, and esophagus demonstrate no significant findings. Lungs/Pleura: Lungs are clear. No pleural effusion or pneumothorax. Musculoskeletal: No chest wall mass or suspicious osseous lesions identified. CT ABDOMEN PELVIS FINDINGS Hepatobiliary: No focal liver abnormality is seen. Status post cholecystectomy. Similar severe postoperative biliary ductal dilatation. Pancreas: Unremarkable. No pancreatic ductal dilatation or surrounding inflammatory changes. Spleen: Normal in size without significant abnormality. Adrenals/Urinary Tract: Adrenal glands are unremarkable. Kidneys are normal, without renal calculi, solid lesion, or hydronephrosis. Bladder is unremarkable. Stomach/Bowel: Stomach is within normal limits. Appendix not clearly visualized. No evidence of bowel wall thickening, distention, or inflammatory changes. Vascular/Lymphatic: Aortic atherosclerosis. 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 No enlarged abdominal or pelvic lymph nodes. Reproductive: No mass or other abnormality. Other: No abdominal wall hernia or abnormality. No ascites. Musculoskeletal: No acute osseous findings. CT THORACIC AND LUMBAR SPINE FINDINGS  Alignment: Normal thoracic kyphosis. Normal lumbar lordosis. Vertebral bodies: No acute fracture or dislocation. Unchanged mild superior endplate wedge deformity of L2, with approximately 20% height loss. Disc spaces: Generally mild multilevel disc space height loss and osteophytosis throughout the thoracic and lumbar spine, focally moderate at L3-L4. Paraspinous soft tissues: Unremarkable. IMPRESSION: 1. No CT evidence of acute traumatic injury to the chest, abdomen, or pelvis. 2. No acute fracture or dislocation of the thoracic or lumbar spine. Unchanged mild, chronic wedge deformity of L2. 3. Status post cholecystectomy. Similar severe postoperative biliary ductal dilatation. 4. Coronary artery disease. Aortic Atherosclerosis (ICD10-I70.0). Electronically Signed   By: Jearld Lesch M.D.   On: 05/13/2023 17:28   CT T-SPINE NO CHARGE Result Date: 05/13/2023 CLINICAL DATA:  Found unresponsive, unknown down time EXAM: CT CHEST, ABDOMEN, AND PELVIS WITH CONTRAST CT THORACIC AND LUMBAR SPINE WITH CONTRAST TECHNIQUE: Multidetector CT imaging of the chest, abdomen and pelvis was performed following the standard protocol during bolus administration of intravenous contrast. Multidetector CT imaging of the thoracic and lumbar spine was performed following the standard protocol during bolus administration of intravenous contrast. RADIATION DOSE REDUCTION: This exam was performed according to the departmental dose-optimization program which includes automated exposure control, adjustment of the mA and/or kV according to patient size and/or use of iterative reconstruction technique. CONTRAST:  75mL OMNIPAQUE IOHEXOL 350 MG/ML SOLN COMPARISON:  CT abdomen pelvis, 04/22/2014 FINDINGS: CT CHEST FINDINGS Cardiovascular: Left chest multi lead pacer. Aortic atherosclerosis. Normal heart size. Scattered left and right coronary artery calcifications. No pericardial effusion. Mediastinum/Nodes: No enlarged mediastinal, hilar, or  axillary lymph nodes. Thyroid gland, trachea, and esophagus demonstrate no significant findings. Lungs/Pleura: Lungs are clear. No pleural effusion or pneumothorax. Musculoskeletal: No chest wall mass or suspicious osseous lesions  identified. CT ABDOMEN PELVIS FINDINGS Hepatobiliary: No focal liver abnormality is seen. Status post cholecystectomy. Similar severe postoperative biliary ductal dilatation. Pancreas: Unremarkable. No pancreatic ductal dilatation or surrounding inflammatory changes. Spleen: Normal in size without significant abnormality. Adrenals/Urinary Tract: Adrenal glands are unremarkable. Kidneys are normal, without renal calculi, solid lesion, or hydronephrosis. Bladder is unremarkable. Stomach/Bowel: Stomach is within normal limits. Appendix not clearly visualized. No evidence of bowel wall thickening, distention, or inflammatory changes. Vascular/Lymphatic: Aortic atherosclerosis. 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 No enlarged abdominal or pelvic lymph nodes. Reproductive: No mass or other abnormality. Other: No abdominal wall hernia or abnormality. No ascites. Musculoskeletal: No acute osseous findings. CT THORACIC AND LUMBAR SPINE FINDINGS Alignment: Normal thoracic kyphosis. Normal lumbar lordosis. Vertebral bodies: No acute fracture or dislocation. Unchanged mild superior endplate wedge deformity of L2, with approximately 20% height loss. Disc spaces: Generally mild multilevel disc space height loss and osteophytosis throughout the thoracic and lumbar spine, focally moderate at L3-L4. Paraspinous soft tissues: Unremarkable. IMPRESSION: 1. No CT evidence of acute traumatic injury to the chest, abdomen, or pelvis. 2. No acute fracture or dislocation of the thoracic or lumbar spine. Unchanged mild, chronic wedge deformity of L2. 3. Status post cholecystectomy. Similar severe postoperative biliary ductal dilatation. 4.  Coronary artery disease. Aortic Atherosclerosis (ICD10-I70.0). Electronically Signed   By: Jearld Lesch M.D.   On: 05/13/2023 17:28   CT HEAD WO CONTRAST Result Date: 05/13/2023 CLINICAL DATA:  Found unresponsive, unknown downtime EXAM: CT HEAD WITHOUT CONTRAST CT MAXILLOFACIAL WITHOUT CONTRAST CT CERVICAL SPINE WITHOUT CONTRAST TECHNIQUE: Multidetector CT imaging of the head, cervical spine, and maxillofacial structures were performed using the standard protocol without intravenous contrast. Multiplanar CT image reconstructions of the cervical spine and maxillofacial structures were also generated. RADIATION DOSE REDUCTION: This exam was performed according to the departmental dose-optimization program which includes automated exposure control, adjustment of the mA and/or kV according to patient size and/or use of iterative reconstruction technique. COMPARISON:  04/10/2023 FINDINGS: CT HEAD FINDINGS Brain: No evidence of acute infarction, hemorrhage, hydrocephalus, extra-axial collection or mass lesion/mass effect. Mild periventricular and deep white matter hypodensity. Unchanged left occipital encephalomalacia. Vascular: No hyperdense vessel or unexpected calcification. CT FACIAL BONES FINDINGS Skull: Normal. Negative for fracture or focal lesion. Facial bones: No displaced fractures or dislocations. Sinuses/Orbits: Small air-fluid level in the right maxillary sinus. No overlying fracture. Other: Patient is partially edentulous with erosion of the left maxillary alveolar bone into the left maxillary sinus (series 7, image 31). CT CERVICAL SPINE FINDINGS Alignment: Normal. Skull base and vertebrae: No acute fracture. No primary bone lesion or focal pathologic process. Soft tissues and spinal canal: No prevertebral fluid or swelling. No visible canal hematoma. Disc levels: Focally moderate disc space height loss and osteophytosis of the lower cervical levels C5-C7 Upper chest: Negative. Other: None. IMPRESSION:  1. No acute intracranial pathology. Small-vessel white matter disease and unchanged left occipital encephalomalacia. 2. No displaced fractures or dislocations of the facial bones. 3. Small air-fluid level in the right maxillary sinus. No overlying fracture. Patient is partially edentulous with erosion of the left maxillary alveolar bone into the left maxillary sinus. Correlate for sinusitis. 4. No fracture or static subluxation of the cervical spine. 5. Focally moderate disc space height loss and osteophytosis of the lower cervical levels. Electronically Signed   By: Jearld Lesch M.D.   On: 05/13/2023 17:20   CT CERVICAL SPINE WO CONTRAST Result Date:  05/13/2023 CLINICAL DATA:  Found unresponsive, unknown downtime EXAM: CT HEAD WITHOUT CONTRAST CT MAXILLOFACIAL WITHOUT CONTRAST CT CERVICAL SPINE WITHOUT CONTRAST TECHNIQUE: Multidetector CT imaging of the head, cervical spine, and maxillofacial structures were performed using the standard protocol without intravenous contrast. Multiplanar CT image reconstructions of the cervical spine and maxillofacial structures were also generated. RADIATION DOSE REDUCTION: This exam was performed according to the departmental dose-optimization program which includes automated exposure control, adjustment of the mA and/or kV according to patient size and/or use of iterative reconstruction technique. COMPARISON:  04/10/2023 FINDINGS: CT HEAD FINDINGS Brain: No evidence of acute infarction, hemorrhage, hydrocephalus, extra-axial collection or mass lesion/mass effect. Mild periventricular and deep white matter hypodensity. Unchanged left occipital encephalomalacia. Vascular: No hyperdense vessel or unexpected calcification. CT FACIAL BONES FINDINGS Skull: Normal. Negative for fracture or focal lesion. Facial bones: No displaced fractures or dislocations. Sinuses/Orbits: Small air-fluid level in the right maxillary sinus. No overlying fracture. Other: Patient is partially edentulous  with erosion of the left maxillary alveolar bone into the left maxillary sinus (series 7, image 31). CT CERVICAL SPINE FINDINGS Alignment: Normal. Skull base and vertebrae: No acute fracture. No primary bone lesion or focal pathologic process. Soft tissues and spinal canal: No prevertebral fluid or swelling. No visible canal hematoma. Disc levels: Focally moderate disc space height loss and osteophytosis of the lower cervical levels C5-C7 Upper chest: Negative. Other: None. IMPRESSION: 1. No acute intracranial pathology. Small-vessel white matter disease and unchanged left occipital encephalomalacia. 2. No displaced fractures or dislocations of the facial bones. 3. Small air-fluid level in the right maxillary sinus. No overlying fracture. Patient is partially edentulous with erosion of the left maxillary alveolar bone into the left maxillary sinus. Correlate for sinusitis. 4. No fracture or static subluxation of the cervical spine. 5. Focally moderate disc space height loss and osteophytosis of the lower cervical levels. Electronically Signed   By: Jearld Lesch M.D.   On: 05/13/2023 17:20   CT MAXILLOFACIAL WO CONTRAST Result Date: 05/13/2023 CLINICAL DATA:  Found unresponsive, unknown downtime EXAM: CT HEAD WITHOUT CONTRAST CT MAXILLOFACIAL WITHOUT CONTRAST CT CERVICAL SPINE WITHOUT CONTRAST TECHNIQUE: Multidetector CT imaging of the head, cervical spine, and maxillofacial structures were performed using the standard protocol without intravenous contrast. Multiplanar CT image reconstructions of the cervical spine and maxillofacial structures were also generated. RADIATION DOSE REDUCTION: This exam was performed according to the departmental dose-optimization program which includes automated exposure control, adjustment of the mA and/or kV according to patient size and/or use of iterative reconstruction technique. COMPARISON:  04/10/2023 FINDINGS: CT HEAD FINDINGS Brain: No evidence of acute infarction,  hemorrhage, hydrocephalus, extra-axial collection or mass lesion/mass effect. Mild periventricular and deep white matter hypodensity. Unchanged left occipital encephalomalacia. Vascular: No hyperdense vessel or unexpected calcification. CT FACIAL BONES FINDINGS Skull: Normal. Negative for fracture or focal lesion. Facial bones: No displaced fractures or dislocations. Sinuses/Orbits: Small air-fluid level in the right maxillary sinus. No overlying fracture. Other: Patient is partially edentulous with erosion of the left maxillary alveolar bone into the left maxillary sinus (series 7, image 31). CT CERVICAL SPINE FINDINGS Alignment: Normal. Skull base and vertebrae: No acute fracture. No primary bone lesion or focal pathologic process. Soft tissues and spinal canal: No prevertebral fluid or swelling. No visible canal hematoma. Disc levels: Focally moderate disc space height loss and osteophytosis of the lower cervical levels C5-C7 Upper chest: Negative. Other: None. IMPRESSION: 1. No acute intracranial pathology. Small-vessel white matter disease and unchanged left occipital encephalomalacia. 2. No  displaced fractures or dislocations of the facial bones. 3. Small air-fluid level in the right maxillary sinus. No overlying fracture. Patient is partially edentulous with erosion of the left maxillary alveolar bone into the left maxillary sinus. Correlate for sinusitis. 4. No fracture or static subluxation of the cervical spine. 5. Focally moderate disc space height loss and osteophytosis of the lower cervical levels. Electronically Signed   By: Jearld Lesch M.D.   On: 05/13/2023 17:20     PHYSICAL EXAM  Temp:  [98.6 F (37 C)-99.8 F (37.7 C)] 99.4 F (37.4 C) (03/08 1155) Pulse Rate:  [59-84] 67 (03/08 1300) Resp:  [16-33] 24 (03/08 1300) BP: (130-168)/(41-133) 145/89 (03/08 1300) SpO2:  [88 %-96 %] 94 % (03/08 1300) Weight:  [62.5 kg] 62.5 kg (03/08 0419)  General - Well nourished, well developed, not  in distress  Ophthalmologic - fundi not visualized due to noncooperation.  Cardiovascular - irregularly irregular heart rate and rhythm.  Neuro - sitting in chair, awake, alert, eyes open, global aphasia, nonverbal and not following commands except closed eyes on request. Left gaze preference but able to cross midline, tracking more on the left, blinking to visual threat on the left but not consistent on the right. R facial droop with increased right palpebral fissure. Tongue protrusion not cooperative. LUE at least 3/5, RUE flaccid but withdraw to pain. Bilaterally LEs proximal 3/5 and distal LLE 3/5 and RLE 3-/5. Sensation, coordination and gait not tested.    ASSESSMENT/PLAN Ms. ELDENE PLOCHER is a 89 y.o. female with history of HFpEF, pacemaker, A-fib on Xarelto, HLD, HTN, CAD, PVD admitted for right side weakness, aphasia, found down at home. No TNK given due to on Xarelto.    Stroke:  left MCA scattered infarcts embolic secondary to A-fib even on Xarelto CT no acute abnormality, left occipital encephalomalacia CT head and neck left M1 occlusion.  60% stenosis left ICA at the skull base CTP 0/50 cc Status post IR left M1 occlusion with TICI3 MRI multifocal acute infarcts throughout left MCA territory MRA distal left M1 MCA stenosis versus occlusion now patent 2D Echo pending LDL 67 HgbA1c 5.4 Lovenox for VTE prophylaxis Xarelto (rivaroxaban) daily prior to admission, now on aspirin 300 mg suppository daily, will switch to heparin IV. Switch to eliquis once renal function much improved. Ongoing aggressive stroke risk factor management Therapy recommendations: SNF Disposition: Pending  A-fib RVR Cardiology on board on diltiazem IV  Now off amiodarone IV On metoprolol IV 2.5 mg every 6 hours Rate controlled started heparin IV    Respiratory failure, resolved CHF Extubated 05/14/2023, tolerating well Developed AKI and fluid overload Now on lasix Cardiology and nephrology on  board Management per primary team  Hypertension Stable Off Cleviprex BP goal less than 180/105 Long term BP goal normotensive  Hyperlipidemia Home meds: None LDL 67, goal < 70 Statin intolerance No statin needed given LDL at goal  AKI Creatinine 1.24--1.10--1.19--2.34--3.61 Off IV fluid now On lasix Nephrology on board Management per primary team  Dysphagia Did not pass swallow On n.p.o. Cortrak placed Tube feeding initiation per primary team and nephrology   Other Stroke Risk Factors Advanced age HFpEF CAD  Other Active Problems Pacemaker in place PUD Slight leukocytosis, WBC 10.6--8.9--8.8  Hospital day # 4  Patient neurological exam remains stable with global aphasia however she has worsening medical issues with renal failure, heart failure and increasing hypoxia.  Continue IV heparin for now.  Prognosis remains quite poor and she is unlikely to  survive without 24-hour nursing support and likely need nursing home.  No family today at the bedside await palliative care consult.  Discussed with Dr. Richardson Chiquito.  Greater than 50% time during this 35-minute visit was spent counseling and coordination of care and discussion patient care team and family and answering questions.  Stroke team will sign off.  Kindly call for questions     Delia Heady, MD  Stroke Neurology 05/17/2023 1:36 PM    To contact Stroke Continuity provider, please refer to [email or web address]. After hours, contact General Neurology

## 2023-05-18 NOTE — Progress Notes (Signed)
 PHARMACY - ANTICOAGULATION CONSULT NOTE  Pharmacy Consult for heparin Indication: atrial fibrillation  Allergies  Allergen Reactions   Statins Other (See Comments)    Liver damage    Hydroxychloroquine Hives   Haldol [Haloperidol Lactate] Rash   Librax [Chlordiazepoxide-Clidinium] Rash   Plavix [Clopidogrel Bisulfate] Rash   Ramipril Rash    Patient Measurements: Height: 5\' 3"  (160 cm) Weight: 64 kg (141 lb 1.5 oz) IBW/kg (Calculated) : 52.4 Heparin Dosing Weight: 63.5kg  Vital Signs: Temp: 98.8 F (37.1 C) (03/09 1200) Temp Source: Axillary (03/09 1200) BP: 162/57 (03/09 1200) Pulse Rate: 64 (03/09 1200)  Labs: Recent Labs     0000 05/15/23 1244 05/16/23 0501 05/16/23 2126 05/16/23 2126 05/17/23 0539 05/17/23 1444 05/18/23 0942  HGB   < >  --  10.3*  --   --  10.9*  --  10.6*  HCT  --   --  33.2*  --   --  34.2*  --  33.2*  PLT  --   --  138*  --   --  164  --  169  APTT  --   --   --  117*  --   --   --   --   HEPARINUNFRC  --   --   --  0.78*   < > 0.62 0.41 0.33  CREATININE  --   --  3.61*  --   --  4.49*  --  4.77*  CKTOTAL  --  311* 327*  --   --   --   --   --    < > = values in this interval not displayed.    Estimated Creatinine Clearance: 6.8 mL/min (A) (by C-G formula based on SCr of 4.77 mg/dL (H)).   Assessment: 89 yo F with PMH of HFpEF, nonobstructive coronary disease, permanent atrial fibrillation/flutter on Xarelto PTA (unknown last dose, last fill 02/10/23 for 90DS), CKD 3, and cirrhosis admitted for L MCA stroke. Underwent thrombectomy 3/5 achieving TICI3 revascularization. Stroke team cleared her to be on IV heparin.  Heparin level therapeutic; CBC stable; no bleeding reported.  Goal of Therapy:  Heparin level 0.3-0.5 units/ml Monitor platelets by anticoagulation protocol: Yes   Plan:   Continue heparin at 600 units/hr Daily heparin level and CBC  Shalik Sanfilippo D. Laney Potash, PharmD, BCPS, BCCCP 05/18/2023, 12:34 PM

## 2023-05-18 NOTE — Progress Notes (Signed)
   Patient Name: Ruth Roth Date of Encounter: 05/18/2023 [facility] Cardiologist: Lorine Bears, MD   Interval Summary  .    Aphasic less tachypnea   Vital Signs .    Vitals:   05/18/23 0526 05/18/23 0600 05/18/23 0700 05/18/23 0800  BP: (!) 167/68 (!) 146/58 (!) 142/65 (!) 164/78  Pulse:  60 60 66  Resp:  (!) 22 (!) 25 (!) 22  Temp:    99.2 F (37.3 C)  TempSrc:    Axillary  SpO2:  99% 96% 96%  Weight:      Height:        Intake/Output Summary (Last 24 hours) at 05/18/2023 0842 Last data filed at 05/18/2023 0700 Gross per 24 hour  Intake 1588.32 ml  Output 1390 ml  Net 198.32 ml      05/18/2023    5:00 AM 05/17/2023    4:19 AM 05/14/2023    1:28 AM  Last 3 Weights  Weight (lbs) 141 lb 1.5 oz 137 lb 12.6 oz 137 lb 2 oz  Weight (kg) 64 kg 62.5 kg 62.2 kg      Telemetry/ECG    Atrial flutter with mostly 4: 1 AV block, infrequent ventricular pacing- Personally Reviewed  Physical Exam .    Elderly female Aphasic Tachypnea LE varicosities plus one edema  No murmur PPM under left clavicle  Abdomen benign prior Bilroth 2  Assessment & Plan .     Atrial Flutter:  rates controlled on heparin EF 60-65% by TTE  chronic oral cardizem per tube feeds  PPM:  normal function St jude device VVI lower rate 60 bpm A/CRF:  limited urine output ? ATN being down at home CR upt to 4.49 with limited urine output Nurse indicates no dialysis  BMET pending this am  Stroke:  post IR thrombolysis occluded left MCA continue heparin  Diastolic dysfunction: breathing better on iv lasix dosing per nephrology I/OS about even CXR 3/8 improving edema  Consider palliative care consult     Signed, Charlton Haws, MD

## 2023-05-18 NOTE — Progress Notes (Signed)
 Admit: 05/13/2023 LOS: 5  86F with AKI on CKD3, oliguric after presenting with ischemic CVA undergoing mechanical thrombectomy    Subjective:  Made 1.5 L of urine Creatinine only slightly increased, K4.4 and BUN of 84 Some more verbalization Remains on HFNC   03/08 0701 - 03/09 0700 In: 1635.3 [I.V.:145.2; NG/GT:1360; IV Piggyback:130.1] Out: 1500 [Urine:1500]  Filed Weights   05/14/23 0128 05/17/23 0419 05/18/23 0500  Weight: 62.2 kg 62.5 kg 64 kg    Scheduled Meds:  arformoterol  15 mcg Nebulization BID   aspirin  81 mg Per Tube Daily   budesonide (PULMICORT) nebulizer solution  0.25 mg Nebulization BID   Chlorhexidine Gluconate Cloth  6 each Topical Daily   diltiazem  60 mg Per Tube Q8H   feeding supplement (PROSource TF20)  60 mL Per Tube Daily   Gerhardt's butt cream   Topical TID   insulin aspart  0-6 Units Subcutaneous Q4H   ipratropium  0.5 mg Nebulization TID   levalbuterol  0.63 mg Nebulization TID   metoprolol tartrate  50 mg Per Tube BID   mupirocin ointment  1 Application Nasal BID   mouth rinse  15 mL Mouth Rinse 4 times per day   sodium bicarbonate  650 mg Per Tube BID   thiamine  100 mg Per Tube Daily   Continuous Infusions:  feeding supplement (OSMOLITE 1.5 CAL) 40 mL/hr at 05/18/23 1000   furosemide Stopped (05/18/23 0844)   heparin 600 Units/hr (05/18/23 1000)   PRN Meds:.acetaminophen **OR** acetaminophen (TYLENOL) oral liquid 160 mg/5 mL **OR** acetaminophen, bisacodyl, dextrose, iohexol, levalbuterol, mouth rinse, mouth rinse, polyethylene glycol, senna-docusate  Current Labs: reviewed    Physical Exam:  Blood pressure (!) 163/66, pulse 64, temperature 99.2 F (37.3 C), temperature source Axillary, resp. rate (!) 23, height 5\' 3"  (1.6 m), weight 64 kg, SpO2 92%. GEN: Awake, alert, tracking but not able to speak; aphasia ENT: NCAT with NG tube in place EYES: EOMI CV: Irregularly irregular, normal rate PULM: Clear bilaterally ABD: Soft,  nontender SKIN: No rashes or lesions EXT: No significant edema GU: Foley catheter present  A AKI on CKD 3 likely due to ATN from contrast exposure and acute illness; urine output following, UOP improving with high-dose furosemide Progressive hypervolemia about 5 L positive from presentation Metabolic acidosis from #1 Acute ischemic left MCA CVA status post mechanical thrombectomy 3//25 Atrial fib/flutter currently on heparin Mild hyponatremia, doubt clinically significant, trend  P Continue current dosing of furosemide for another 24 hours No indication for RRT, nor should it be offered given her comorbidities and age; primary has discussed this with family and by report they are in agreement Will continue to follow closely Medication Issues; Preferred narcotic agents for pain control are hydromorphone, fentanyl, and methadone. Morphine should not be used.  Baclofen should be avoided Avoid oral sodium phosphate and magnesium citrate based laxatives / bowel preps    Sabra Heck MD 05/18/2023, 10:38 AM  Recent Labs  Lab 05/16/23 0501 05/17/23 0539 05/17/23 1725 05/18/23 0942  NA 143 147*  --  147*  K 3.8 4.1  --  4.4  CL 114* 116*  --  113*  CO2 14* 18*  --  20*  GLUCOSE 89 135*  --  132*  BUN 45* 64*  --  84*  CREATININE 3.61* 4.49*  --  4.77*  CALCIUM 8.6* 9.3  --  9.4  PHOS 6.0* 6.6* 6.1* 6.4*   Recent Labs  Lab 05/16/23 0501 05/17/23 0539 05/18/23  0942  WBC 8.8 9.4 7.9  NEUTROABS 7.4 8.0* 6.6  HGB 10.3* 10.9* 10.6*  HCT 33.2* 34.2* 33.2*  MCV 100.0 96.9 97.6  PLT 138* 164 169

## 2023-05-18 NOTE — Progress Notes (Signed)
 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  She missed her doctors appointment on 3 4 and a wellness check was performed and they did break down her door to access her home.  She is found lying on the ground with multiple bruises and transferred to the ED as a level 2 trauma.  Upon arrival to the ED she was noted to have rapid atrial fibrillation as well as evidence of UTI with elevation in CK.  Trauma workup was unremarkable and cardiology is consulted for atrial fibrillation flutter.  She started on diltiazem infusion and was placed on IV amiodarone as well as beta-blocker.  She was placed on IV heparin and there is concern for stroke provoking her fall neurology was consulted.  CTA of the head and neck demonstrated occluded versus a severely stenotic distal left M1 MCA with 50 mL of left MCA penumbra.  Options were considered and she ultimately underwent interventional radiology for mechanical thrombectomy and ATICI 3 revascularization result was achieved.  Post procedurally she was transferred to the neuro ICU and PCCM was consulted given that she remained on the ventilator.    She was then subsequently extubated and sedation was weaned and transferred to the [facility] service on 05/15/2023.  She currently continues to have global aphasia and now has a worsening AKI so nephrology has been consulted and workup for AKI has been done. Cortrak is place now and Meds are being adjusted.  Prognosis is poor so palliative is consulted for  further goals of care discussion and renal function continues to worsen and we are giving her IV Lasix as a last ditch effort reason that she is not a dialysis candidate.  Assessment and Plan:  Acute left MCA scattered infarcts which is likely embolic to an A-fib event even on anticoagulation status post revascularization TICI 3 -She is status post IR left M1 occlusion with a tiki 3 and MRI showed multifocal acute infarcts throughout the left MCA territory.  MRI revealed distal M1 MCA stenosis versus occlusion which is now patent Echocardiogram has been done and neurology now recommending aspirin 300 mg daily suppository and Eliquis once p.o. access established.  Plan is for small bore core track in the a.m. further care per neurology and PT OT currently are recommending SNF.  Further stroke team recommendations appreciated and recommending systolic goal blood pressure being 03/31/1936 and maintain neuroprotective measures with goal for euthymia, euglycemia, natremia normoxia and appears CO2 of 35-40 -Neurology has now started her on IV Heparin Drip and have now signed off the case -Palliative care consulted for goals of care discussion given worsening   A flutter/Atrial Fibrillation with RVR/CAD/permanent pacemaker:  -IV therapies with IV diltiazem and IV metoprolol changed to orals on 05/16/23. C/w oral diltiazem 60 mg per tube Q8 as well as metoprolol to tartrate 50 mg per tube.  Will switch from Xarelto to Eliquis when she is able to be given enteral anticoagulant since and that she may have failed Xarelto but her worsening renal function this we will start her on IV heparin for now. Continue to  monitor on telemetry and further care per Cardiology  Acute on Chronic diastolic heart failure with preserved ejection fraction -BNP went from 673.8 -> 1,268.9 -Continue to monitor for signs and symptoms 1 we will give now that we are rehydrating her given her AKI -Strict I's and O's and daily weights is  +5.459 L since admission.  UOP is improving with getting IV diuresis now as below and nephrology consulted for further assistance  AKI on CKD Stage 3b, worsening further Metabolic Acidosis Hypernatremia -BUN/Cr Trend: Recent Labs  Lab 05/13/23 1518 05/13/23 1526 05/14/23 0455 05/15/23 0538 05/16/23 0501 05/17/23 0539  BUN 29* 31* 31* 35* 45* 64*  CREATININE 1.24* 1.10* 1.19* 2.34* 3.61* 4.49*  -Check urinalysis, urine sodium, urine creatinine, urine osmolality and renal ultrasound as well as bladder scans; was given IV fluid hydration but this is stopped given her volume overload and now getting diuresis. -Patient's Metabolic acidosis is improving and now she has a CO2 of 20, anion gap of 14, chloride level 113 -Na+ is now trending up: 140 -> 143 -> 147 -Avoid Nephrotoxic Medications, Contrast Dyes, Hypotension and Dehydration to Ensure Adequate Renal Perfusion and will need to Renally Adjust Meds -Continue to Monitor and Trend Renal Function carefully and repeat CMP in the AM  -Nephrology consulted for further evaluation and given her progressive hypoxia they are continuing her IV Lasix dosing with 160 mg twice Daily. They do not feel any strong indication for RRT and feel that she is a very poor candidate and recommended against dialysis. -Palliative care has been consulted for further goals of care discussion given her worsening renal function and poor prognosis and they are still to evaluate the patient  Rhabdomyolysis -Improving.  CK has gone from 1135 -> 472 -> 311 and is now 327 on last check.  Discontinued IVF as above and getting IV Lasix and Continue to Monitor and Trend and repeat CMP in the AM  Normocytic Anemia: Hemoglobin/hematocrit is now to be stable at 10.6/33.2.  Check anemia panel in AM.  Continue to monitor for signs of bleeding; no overt bleeding noted but she was FOBT positive on admission.  Repeat CBC in a.m.  Thrombocytopenia: Platelet Count Dropped to 138 and is  now 164 today. CTM for S/Sx of Bleeding and repeat CBC in the AM and C  Sepsis Secondary to Enterobacter Cloacae UTI -Was given a course of IV Ceftriaxone which is now stopped leukocytosis has resolved and is 7.9 and urinalysis is improving  Acute Respiratory Failure with Hypoxia -Was vented postoperatively and was weaned and now started on Xopenex and Atrovent.the oxygen requirements worsened given her volume overload and lack of urine output and current O2 settings are: SpO2: 97 %; O2 Flow Rate (L/min): 10 L/min, FiO2 (%): 30 % -Wearing supplemental oxygen via nasal cannula and is being given IV Lasix her volume overload she continues to retain fluid given her worsening kidney functions -Continue breathing treatments per eval, incentive spirometer and guaifenesin  Hypothyroidism -TSH is elevated at 8.301 but free T4 is within normal limits at 0.65; resumed home levothyroxine 137 mcg p.o. via the Cortrak  Hypoalbuminemia: Patient's Albumin Trend ranginf rom 3.0-3.8; Was 3.0 today. CTM and repeat CMP in the AM   DVT prophylaxis: Place and maintain sequential compression device Start: 05/14/23 0151 SCDs Start: 05/13/23 2145 Place TED hose Start: 05/13/23 2145    Code Status: Full Code Family Communication: Discussed with the family at bedside  Disposition Plan:  Level of care: ICU Status is:  Inpatient Remains inpatient appropriate because: Needs further clinical improvement   Consultants:  Nephrology Neurology Cardiology Palliative Care Medicine PCCM Transfer  Procedures:  As delineated as above  Antimicrobials:  Anti-infectives (From admission, onward)    Start     Dose/Rate Route Frequency Ordered Stop   05/14/23 0800  cefTRIAXone (ROCEPHIN) 1 g in sodium chloride 0.9 % 100 mL IVPB        1 g 200 mL/hr over 30 Minutes Intravenous Every 24 hours 05/13/23 2138 05/15/23 0826   05/13/23 2000  cefTRIAXone (ROCEPHIN) 1 g in sodium chloride 0.9 % 100 mL IVPB        1 g 200  mL/hr over 30 Minutes Intravenous  Once 05/13/23 1950 05/13/23 2055       Subjective: Seen and examined at bedside and nursing states that she is a little bit more agitated today and was able to verbalize "ow".  Respiratory status has not really improved but urine output is picking up slowly.  Chest x-ray does look a little bit better.  Family at bedside and nephrology continue IV Lasix.  Renal function continues to slightly worsen.  No other concerns or complaints at this time.  Objective: Vitals:   05/18/23 1400 05/18/23 1434 05/18/23 1500 05/18/23 1600  BP: (!) 155/76  (!) 155/57 (!) 163/60  Pulse: 60 60 64 67  Resp: (!) 24 19 (!) 23 (!) 25  Temp:    99.4 F (37.4 C)  TempSrc:    Axillary  SpO2: 95% 95% 96% 97%  Weight:      Height:        Intake/Output Summary (Last 24 hours) at 05/18/2023 1744 Last data filed at 05/18/2023 1700 Gross per 24 hour  Intake 1435.1 ml  Output 1860 ml  Net -424.9 ml   Filed Weights   05/14/23 0128 05/17/23 0419 05/18/23 0500  Weight: 62.2 kg 62.5 kg 64 kg   Examination: Physical Exam:  Constitutional: Elderly Caucasian female who is about the same as yesterday what does follow some commands but appears more restless Respiratory: Diminished to auscultation bilaterally with some coarse breath sounds and does have some rhonchi and crackles.  No appreciable wheezing or rales but does have an increased respiratory effort and is wearing supplemental oxygen via nasal cannula at 10 L Cardiovascular: Irregularly irregular but rate controlled., no murmurs / rubs / gallops. S1 and S2 auscultated.  Mild lower extremity edema Abdomen: Soft, non-tender, non-distended. Bowel sounds positive.  GU: Deferred. Musculoskeletal: No clubbing / cyanosis of digits/nails. No joint deformity upper and lower extremities. Skin: No rashes, lesions, ulcers on a limited skin evaluation. No induration; Warm and dry.  Neurologic: She is awake and alert but continues remain  extremely aphasic and does follow some commands.  Has a right facial droop noted. Psychiatric: Appears little uncomfortable today and is awake and alert  Data Reviewed: I have personally reviewed following labs and imaging studies  CBC: Recent Labs  Lab 05/14/23 0455 05/15/23 0538 05/16/23 0501 05/17/23 0539 05/18/23 0942  WBC 10.6* 8.9 8.8 9.4 7.9  NEUTROABS 8.9*  --  7.4 8.0* 6.6  HGB 13.1 10.7* 10.3* 10.9* 10.6*  HCT 41.4 34.2* 33.2* 34.2* 33.2*  MCV 97.6 99.7 100.0 96.9 97.6  PLT 170 155 138* 164 169   Basic Metabolic Panel: Recent Labs  Lab 05/14/23 0455 05/15/23 0538 05/16/23 0501 05/17/23 0539 05/17/23 1725 05/18/23 0942 05/18/23 1625  NA 142 140 143 147*  --  147*  --   K 3.7  4.1 3.8 4.1  --  4.4  --   CL 108 112* 114* 116*  --  113*  --   CO2 19* 15* 14* 18*  --  20*  --   GLUCOSE 145* 91 89 135*  --  132*  --   BUN 31* 35* 45* 64*  --  84*  --   CREATININE 1.19* 2.34* 3.61* 4.49*  --  4.77*  --   CALCIUM 9.0 8.6* 8.6* 9.3  --  9.4  --   MG  --   --  2.0 2.2 2.3 2.5* 2.6*  PHOS  --   --  6.0* 6.6* 6.1* 6.4* 6.7*   GFR: Estimated Creatinine Clearance: 6.8 mL/min (A) (by C-G formula based on SCr of 4.77 mg/dL (H)). Liver Function Tests: Recent Labs  Lab 05/14/23 0455 05/15/23 0538 05/16/23 0501 05/17/23 0539 05/18/23 0942  AST 43* 33 28 28 20   ALT 41 37 37 39 35  ALKPHOS 79 70 73 82 82  BILITOT 0.7 0.6 0.6 0.8 0.6  PROT 5.8* 5.7* 5.4* 6.2* 5.7*  ALBUMIN 3.2* 3.5 3.2* 3.3* 3.0*   No results for input(s): "LIPASE", "AMYLASE" in the last 168 hours. Recent Labs  Lab 05/13/23 1835  AMMONIA 17   Coagulation Profile: Recent Labs  Lab 05/13/23 1518 05/14/23 0455  INR 1.2 1.2   Cardiac Enzymes: Recent Labs  Lab 05/13/23 1518 05/14/23 0455 05/15/23 1244 05/16/23 0501  CKTOTAL 1,135* 472* 311* 327*   BNP (last 3 results) No results for input(s): "PROBNP" in the last 8760 hours. HbA1C: No results for input(s): "HGBA1C" in the last 72  hours. CBG: Recent Labs  Lab 05/18/23 0008 05/18/23 0341 05/18/23 0810 05/18/23 1133 05/18/23 1538  GLUCAP 123* 128* 146* 141* 128*   Lipid Profile: No results for input(s): "CHOL", "HDL", "LDLCALC", "TRIG", "CHOLHDL", "LDLDIRECT" in the last 72 hours. Thyroid Function Tests: No results for input(s): "TSH", "T4TOTAL", "FREET4", "T3FREE", "THYROIDAB" in the last 72 hours. Anemia Panel: No results for input(s): "VITAMINB12", "FOLATE", "FERRITIN", "TIBC", "IRON", "RETICCTPCT" in the last 72 hours. Sepsis Labs: Recent Labs  Lab 05/13/23 1526 05/14/23 0455 05/15/23 1015 05/15/23 1244  LATICACIDVEN 3.2* 2.2* 1.4 1.6    Recent Results (from the past 240 hours)  MRSA Next Gen by PCR, Nasal     Status: Abnormal   Collection Time: 05/14/23 12:59 AM   Specimen: Nasal Mucosa; Nasal Swab  Result Value Ref Range Status   MRSA by PCR Next Gen DETECTED (A) NOT DETECTED Final    Comment: RESULT CALLED TO, READ BACK BY AND VERIFIED WITH: K JONES,RN@0210  05/14/23 MK (NOTE) The GeneXpert MRSA Assay (FDA approved for NASAL specimens only), is one component of a comprehensive MRSA colonization surveillance program. It is not intended to diagnose MRSA infection nor to guide or monitor treatment for MRSA infections. Test performance is not FDA approved in patients less than 68 years old. Performed at [facility], [address], [place], [place] [address]   Culture, blood (Routine X 2) w Reflex to ID Panel     Status: None (Preliminary result)   Collection Time: 05/14/23  4:42 AM   Specimen: BLOOD  Result Value Ref Range Status   Specimen Description BLOOD SITE NOT SPECIFIED  Final   Special Requests   Final    AEROBIC BOTTLE ONLY Blood Culture results may not be optimal due to an inadequate volume of blood received in culture bottles   Culture   Final    NO  GROWTH 4 DAYS Performed at [facility], [address] [place], [place] [address]    Report Status PENDING   Incomplete  Culture, blood (Routine X 2) w Reflex to ID Panel     Status: None (Preliminary result)   Collection Time: 05/14/23  4:55 AM   Specimen: BLOOD RIGHT HAND  Result Value Ref Range Status   Specimen Description BLOOD RIGHT HAND  Final   Special Requests   Final    BOTTLES DRAWN AEROBIC AND ANAEROBIC Blood Culture adequate volume   Culture   Final    NO GROWTH 4 DAYS Performed at [facility], [address] [place], [place] [address]    Report Status PENDING  Incomplete  Urine Culture (for pregnant, neutropenic or urologic patients or patients with an indwelling urinary catheter)     Status: Abnormal   Collection Time: 05/14/23  6:22 AM   Specimen: Urine, Clean Catch  Result Value Ref Range Status   Specimen Description URINE, CLEAN CATCH  Final   Special Requests   Final    NONE Performed at [facility], [address] [place], [place] [address]    Culture 10,000 COLONIES/mL ENTEROBACTER CLOACAE (A)  Final   Report Status 05/16/2023 FINAL  Final   Organism ID, Bacteria ENTEROBACTER CLOACAE (A)  Final      Susceptibility   Enterobacter cloacae - MIC*    CEFEPIME <=0.12 SENSITIVE Sensitive     CIPROFLOXACIN <=0.25 SENSITIVE Sensitive     GENTAMICIN <=1 SENSITIVE Sensitive     IMIPENEM 0.5 SENSITIVE Sensitive     NITROFURANTOIN 64 INTERMEDIATE Intermediate     TRIMETH/SULFA <=20 SENSITIVE Sensitive     PIP/TAZO <=4 SENSITIVE Sensitive ug/mL    * 10,000 COLONIES/mL ENTEROBACTER CLOACAE  Resp panel by RT-PCR (RSV, Flu A&B, Covid) Anterior Nasal Swab     Status: None   Collection Time: 05/14/23  6:22 AM   Specimen: Anterior Nasal Swab  Result Value Ref Range Status   SARS Coronavirus 2 by RT PCR NEGATIVE NEGATIVE Final   Influenza A by PCR NEGATIVE NEGATIVE Final   Influenza B by PCR NEGATIVE NEGATIVE Final    Comment: (NOTE) The Xpert Xpress SARS-CoV-2/FLU/RSV plus assay is intended as an aid in the diagnosis of influenza from Nasopharyngeal swab  specimens and should not be used as a sole basis for treatment. Nasal washings and aspirates are unacceptable for Xpert Xpress SARS-CoV-2/FLU/RSV testing.  Fact Sheet for Patients: BloggerCourse.com  Fact Sheet for Healthcare Providers: SeriousBroker.it  This test is not yet approved or cleared by the [place] FDA and has been authorized for detection and/or diagnosis of SARS-CoV-2 by FDA under an Emergency Use Authorization (EUA). This EUA will remain in effect (meaning this test can be used) for the duration of the COVID-19 declaration under Section 564(b)(1) of the Act, 21 U.S.C. section 360bbb-3(b)(1), unless the authorization is terminated or revoked.     Resp Syncytial Virus by PCR NEGATIVE NEGATIVE Final    Comment: (NOTE) Fact Sheet for Patients: BloggerCourse.com  Fact Sheet for Healthcare Providers: SeriousBroker.it  This test is not yet approved or cleared by the [place] FDA and has been authorized for detection and/or diagnosis of SARS-CoV-2 by FDA under an Emergency Use Authorization (EUA). This EUA will remain in effect (meaning this test can be used) for the duration of the COVID-19 declaration under Section 564(b)(1) of the Act, 21 U.S.C. section 360bbb-3(b)(1), unless the authorization is terminated or revoked.  Performed at [facility], [address] [place], [place] [address]     Radiology Studies: DG CHEST PORT 1 VIEW Result Date: 05/17/2023 CLINICAL DATA:  Dyspnea EXAM: PORTABLE CHEST 1 VIEW COMPARISON:  05/17/2023 FINDINGS: Nasoenteric feeding tube extends into the upper abdomen beyond the margin of the examination. Pulmonary insufflation is stable. Mild to moderate bilateral perihilar interstitial pulmonary edema has improved. Small left pleural effusion is stable. No pneumothorax. Cardiac size is within normal limits. Left subclavian  dual lead pacemaker is unchanged. IMPRESSION: 1. Improving mild to moderate pulmonary edema. Electronically Signed   By: Helyn Numbers M.D.   On: 05/17/2023 22:11   DG CHEST PORT 1 VIEW Result Date: 05/17/2023 CLINICAL DATA:  [id number] SOB (shortness of breath) [id number] EXAM: PORTABLE CHEST 1 VIEW COMPARISON:  May 16, 2023 FINDINGS: The cardiomediastinal silhouette is unchanged in contour. The enteric tube courses through the chest to the abdomen beyond the field-of-view. LEFT chest cardiac pacing device. Atherosclerotic calcifications. Favor increased layering RIGHT pleural effusion. Favored small LEFT pleural effusion. No pneumothorax. Bibasilar heterogeneous opacities, favored slightly increased throughout the RIGHT lung. Renal contrast of the LEFT kidney. IMPRESSION: 1. Favor increased layering RIGHT pleural effusion with adjacent atelectasis. 2. Favored small LEFT pleural effusion. 3. Background of interstitial prominence and heterogeneous opacities, possibly reflecting edema versus atypical infection or aspiration. 4. Retained contrast within the LEFT kidney likely reflecting renal impairment. Electronically Signed   By: Meda Klinefelter M.D.   On: 05/17/2023 10:25   Scheduled Meds:  arformoterol  15 mcg Nebulization BID   aspirin  81 mg Per Tube Daily   budesonide (PULMICORT) nebulizer solution  0.25 mg Nebulization BID   Chlorhexidine Gluconate Cloth  6 each Topical Daily   diltiazem  60 mg Per Tube Q8H   feeding supplement (PROSource TF20)  60 mL Per Tube Daily   Gerhardt's butt cream   Topical TID   insulin aspart  0-6 Units Subcutaneous Q4H   ipratropium  0.5 mg Nebulization TID   levalbuterol  0.63 mg Nebulization TID   [START ON 05/19/2023] levothyroxine  137 mcg Per Tube Q0600   metoprolol tartrate  50 mg Per Tube BID   mupirocin ointment  1 Application Nasal BID   mouth rinse  15 mL Mouth Rinse 4 times per day   sodium bicarbonate  650 mg Per Tube BID   thiamine  100 mg Per Tube  Daily   Continuous Infusions:  feeding supplement (OSMOLITE 1.5 CAL) 40 mL/hr at 05/18/23 1700   furosemide Stopped (05/18/23 0844)   heparin 600 Units/hr (05/18/23 1700)    LOS: 5 days   Marguerita Merles, DO [facility] Available via Epic secure chat 7am-7pm After these hours, please refer to coverage provider listed on [email or web address] 05/18/2023, 5:44 PM

## 2023-05-19 ENCOUNTER — Inpatient Hospital Stay (HOSPITAL_COMMUNITY)

## 2023-05-19 DIAGNOSIS — Z515 Encounter for palliative care: Secondary | ICD-10-CM

## 2023-05-19 DIAGNOSIS — Z7189 Other specified counseling: Secondary | ICD-10-CM | POA: Diagnosis not present

## 2023-05-19 DIAGNOSIS — G9341 Metabolic encephalopathy: Secondary | ICD-10-CM | POA: Diagnosis not present

## 2023-05-19 DIAGNOSIS — N179 Acute kidney failure, unspecified: Secondary | ICD-10-CM | POA: Diagnosis not present

## 2023-05-19 DIAGNOSIS — I484 Atypical atrial flutter: Secondary | ICD-10-CM | POA: Diagnosis not present

## 2023-05-19 DIAGNOSIS — M6282 Rhabdomyolysis: Secondary | ICD-10-CM | POA: Diagnosis not present

## 2023-05-19 DIAGNOSIS — I5032 Chronic diastolic (congestive) heart failure: Secondary | ICD-10-CM | POA: Diagnosis not present

## 2023-05-19 DIAGNOSIS — I63412 Cerebral infarction due to embolism of left middle cerebral artery: Secondary | ICD-10-CM | POA: Diagnosis not present

## 2023-05-19 LAB — CULTURE, BLOOD (ROUTINE X 2)
Culture: NO GROWTH
Culture: NO GROWTH
Special Requests: ADEQUATE

## 2023-05-19 LAB — CBC WITH DIFFERENTIAL/PLATELET
Abs Immature Granulocytes: 0.1 10*3/uL — ABNORMAL HIGH (ref 0.00–0.07)
Basophils Absolute: 0 10*3/uL (ref 0.0–0.1)
Basophils Relative: 0 %
Eosinophils Absolute: 0.3 10*3/uL (ref 0.0–0.5)
Eosinophils Relative: 4 %
HCT: 33.7 % — ABNORMAL LOW (ref 36.0–46.0)
Hemoglobin: 10.6 g/dL — ABNORMAL LOW (ref 12.0–15.0)
Immature Granulocytes: 1 %
Lymphocytes Relative: 6 %
Lymphs Abs: 0.5 10*3/uL — ABNORMAL LOW (ref 0.7–4.0)
MCH: 30.7 pg (ref 26.0–34.0)
MCHC: 31.5 g/dL (ref 30.0–36.0)
MCV: 97.7 fL (ref 80.0–100.0)
Monocytes Absolute: 0.9 10*3/uL (ref 0.1–1.0)
Monocytes Relative: 11 %
Neutro Abs: 6.3 10*3/uL (ref 1.7–7.7)
Neutrophils Relative %: 78 %
Platelets: 185 10*3/uL (ref 150–400)
RBC: 3.45 MIL/uL — ABNORMAL LOW (ref 3.87–5.11)
RDW: 18 % — ABNORMAL HIGH (ref 11.5–15.5)
WBC: 8.1 10*3/uL (ref 4.0–10.5)
nRBC: 0 % (ref 0.0–0.2)

## 2023-05-19 LAB — GLUCOSE, CAPILLARY
Glucose-Capillary: 116 mg/dL — ABNORMAL HIGH (ref 70–99)
Glucose-Capillary: 122 mg/dL — ABNORMAL HIGH (ref 70–99)
Glucose-Capillary: 130 mg/dL — ABNORMAL HIGH (ref 70–99)
Glucose-Capillary: 139 mg/dL — ABNORMAL HIGH (ref 70–99)
Glucose-Capillary: 151 mg/dL — ABNORMAL HIGH (ref 70–99)
Glucose-Capillary: 157 mg/dL — ABNORMAL HIGH (ref 70–99)

## 2023-05-19 LAB — COMPREHENSIVE METABOLIC PANEL
ALT: 32 U/L (ref 0–44)
AST: 22 U/L (ref 15–41)
Albumin: 2.9 g/dL — ABNORMAL LOW (ref 3.5–5.0)
Alkaline Phosphatase: 91 U/L (ref 38–126)
Anion gap: 13 (ref 5–15)
BUN: 101 mg/dL — ABNORMAL HIGH (ref 8–23)
CO2: 25 mmol/L (ref 22–32)
Calcium: 9.7 mg/dL (ref 8.9–10.3)
Chloride: 111 mmol/L (ref 98–111)
Creatinine, Ser: 4.8 mg/dL — ABNORMAL HIGH (ref 0.44–1.00)
GFR, Estimated: 8 mL/min — ABNORMAL LOW (ref >60)
Glucose, Bld: 144 mg/dL — ABNORMAL HIGH (ref 70–99)
Potassium: 4.3 mmol/L (ref 3.5–5.1)
Sodium: 149 mmol/L — ABNORMAL HIGH (ref 135–145)
Total Bilirubin: 0.7 mg/dL (ref 0.0–1.2)
Total Protein: 6 g/dL — ABNORMAL LOW (ref 6.5–8.1)

## 2023-05-19 LAB — HEPARIN LEVEL (UNFRACTIONATED): Heparin Unfractionated: 0.31 [IU]/mL (ref 0.30–0.70)

## 2023-05-19 LAB — PHOSPHORUS: Phosphorus: 6.6 mg/dL — ABNORMAL HIGH (ref 2.5–4.6)

## 2023-05-19 LAB — MAGNESIUM: Magnesium: 2.7 mg/dL — ABNORMAL HIGH (ref 1.7–2.4)

## 2023-05-19 NOTE — Progress Notes (Signed)
 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  She missed her doctors appointment on 3 4 and a wellness check was performed and they did break down her door to access her home.  She is found lying on the ground with multiple bruises and transferred to the ED as a level 2 trauma.  Upon arrival to the ED she was noted to have rapid atrial fibrillation as well as evidence of UTI with elevation in CK.  Trauma workup was unremarkable and cardiology is consulted for atrial fibrillation flutter.  She started on diltiazem infusion and was placed on IV amiodarone as well as beta-blocker.  She was placed on IV heparin and there is concern for stroke provoking her fall neurology was consulted.  CTA of the head and neck demonstrated occluded versus a severely stenotic distal left M1 MCA with 50 mL of left MCA penumbra.  Options were considered and she ultimately underwent interventional radiology for mechanical thrombectomy and ATICI 3 revascularization result was achieved.  Post procedurally she was transferred to the neuro ICU and PCCM was consulted given that she remained on the ventilator.    She was then subsequently extubated and sedation was weaned and transferred to the [facility] service on 05/15/2023.  She currently continues to have global aphasia and now has a worsening AKI so nephrology has been consulted and workup for AKI has been done. Cortrak is place now and Meds are being adjusted.  Prognosis is poor so palliative is consulted for  further goals of care discussion and renal function continue to worsen but now stabilized slightly.  She was given IV Lasix and now this is been discontinued by nephrology.  Palliative care goals of care discussion being had and she is now DNR/DNI.  Assessment and Plan:  Acute left MCA scattered infarcts which is likely embolic to an A-fib event even on anticoagulation status post revascularization TICI 3 -She is status post IR left M1 occlusion with a tiki 3 and MRI showed multifocal acute infarcts throughout the left MCA territory.  MRI revealed distal M1 MCA stenosis versus occlusion which is now patent Echocardiogram has been done and neurology now recommending aspirin 300 mg daily suppository and Eliquis once p.o. access established.  Plan is for small bore core track in the a.m. further care per neurology and PT OT currently are recommending SNF.  Further stroke team recommendations appreciated and recommending systolic goal blood pressure being 03/31/1936 and maintain neuroprotective measures with goal for euthymia, euglycemia, natremia normoxia and appears CO2 of 35-40 -Neurology has now started her on IV Heparin Drip and have now signed off the case -Palliative care consulted for goals of care discussion given worsening   A flutter/Atrial Fibrillation with RVR/CAD/permanent pacemaker:  -Cardiology is following and recommending continuing medical therapy with IV heparin and resume Eliquis 2.5 mg p.o. twice daily when able and recommending continue diltiazem 60 mg every 8 hours for rate control and consolidating when able and also recommending metoprolol to tartrate 50 mg p.o. twice daily.  Currently they have no further current recommendations and are going to  follow peripherally.  She continues have a flutter with a 4-1 AV block and has a permanent pacemaker as well  Acute on Chronic diastolic heart failure with preserved ejection fraction -BNP went from 673.8 -> 1,268.9 -Continue to monitor  for signs and symptoms 1 we will give now that we are rehydrating her given her AKI -Strict I's and O's and daily weights is +3.548 L since admission.  UOP is improving with getting IV diuresis now as below and nephrology consulted for further assistance.  Repeat chest x-ray in a.m.  AKI on CKD Stage 3b, worsening further Metabolic Acidosis Hypernatremia -BUN/Cr Trend: Recent Labs  Lab 05/13/23 1526 05/14/23 0455 05/15/23 0538 05/16/23 0501 05/17/23 0539 05/18/23 0942 05/19/23 0735  BUN 31* 31* 35* 45* 64* 84* 101*  CREATININE 1.10* 1.19* 2.34* 3.61* 4.49* 4.77* 4.80*  -Check urinalysis, urine sodium, urine creatinine, urine osmolality and renal ultrasound as well as bladder scans; was given IV fluid hydration but this is stopped given her volume overload and now getting diuresis. -Patient's Metabolic acidosis is improved and now she has a CO2 of 25, anion gap of 13, chloride level 111 -Na+ is now trending up: 140 -> 143 -> 147 -> 149 -Avoid Nephrotoxic Medications, Contrast Dyes, Hypotension and Dehydration to Ensure Adequate Renal Perfusion and will need to Renally Adjust Meds -Continue to Monitor and Trend Renal Function carefully and repeat CMP in the AM  -Nephrology consulted for further evaluation and given her progressive hypoxia they were continuing her IV Lasix dosing with 160 mg twice Daily however given that her volume status improving and because she is becoming.  Treatment with elevated BUN nephrology is going to stop her furosemide and monitor urine output and labs. They do not feel any strong indication for RRT and feel that she is a very poor candidate and recommended against dialysis. -Palliative care has been consulted for further goals of care discussion given her worsening renal function and poor prognosis.  They have evaluated the patient and patient is not DNR/DNI.  Rhabdomyolysis -Improving.  CK has gone from 1135 -> 472 -> 311 and is now 327 on last check.   Discontinued IVF as above and getting IV Lasix and Continue to Monitor and Trend and repeat CMP in the AM  Normocytic Anemia: Hemoglobin/hematocrit is now to be stable at 10.6/33.7.  Check anemia panel in AM.  Continue to monitor for signs of bleeding; no overt bleeding noted but she was FOBT positive on admission.  Repeat CBC in a.m.  Thrombocytopenia: Platelet Count Dropped to 138 has now recovered and is 185 today. CTM for S/Sx of Bleeding and repeat CBC in the AM and C  Sepsis Secondary to Enterobacter Cloacae UTI -Was given a course of IV Ceftriaxone which is now stopped leukocytosis has resolved and is 7.9 and urinalysis is improving  Acute Respiratory Failure with Hypoxia -Was vented postoperatively and was weaned and now started on Xopenex and Atrovent.the oxygen requirements worsened given her volume overload and lack of urine output and current O2 settings are: SpO2: 99 %; O2 Flow Rate (L/min): 5 L/min, FiO2 (%): 30 %  -Wearing supplemental oxygen via nasal cannula and is being given IV Lasix her volume overload she continues to retain fluid given her worsening kidney functions -Continue breathing treatments per eval, incentive spirometer and guaifenesin  Hypothyroidism -TSH is elevated at 8.301 but free T4 is within normal limits at 0.65; resumed home levothyroxine 137 mcg p.o. via the Cortrak  Hypoalbuminemia: Patient's Albumin Trend Was  2.9 today. CTM and repeat CMP in the AM   DVT prophylaxis: Place and maintain sequential compression device Start: 05/14/23 0151 SCDs Start: 05/13/23 2145 Place TED hose Start: 05/13/23 2145    Code Status: Limited: Do not attempt resuscitation (DNR) -DNR-LIMITED -Do Not Intubate/DNI  Family Communication: Family friends at bedside  Disposition Plan:  Level of care: Progressive Status is: Inpatient Remains inpatient appropriate because: Needs further clinical improvement and clearance by the specialist   Consultants:  PCCM  transfer Nephrology Neurology Cardiology Palliative care medicine  Procedures:  As delineated as above  Antimicrobials:  Anti-infectives (From admission, onward)    Start     Dose/Rate Route Frequency Ordered Stop   05/14/23 0800  cefTRIAXone (ROCEPHIN) 1 g in sodium chloride 0.9 % 100 mL IVPB        1 g 200 mL/hr over 30 Minutes Intravenous Every 24 hours 05/13/23 2138 05/15/23 0826   05/13/23 2000  cefTRIAXone (ROCEPHIN) 1 g in sodium chloride 0.9 % 100 mL IVPB        1 g 200 mL/hr over 30 Minutes Intravenous  Once 05/13/23 1950 05/13/23 2055       Subjective: Seen and examined at bedside and she is doing okay and interactive but does not really follow commands.  Breathing is less labored today.  Making some urine output.  No other concerns or complaints at this time and remains aphasic.  Objective: Vitals:   05/19/23 1700 05/19/23 1815 05/19/23 1947 05/19/23 1954  BP: (!) 151/61 (!) 144/61 (!) 156/63   Pulse: 66 63 69   Resp: (!) 22 15 18    Temp:  98.5 F (36.9 C) 97.8 F (36.6 C)   TempSrc:  Oral    SpO2: 100%  99% 99%  Weight:      Height:        Intake/Output Summary (Last 24 hours) at 05/19/2023 2107 Last data filed at 05/19/2023 1700 Gross per 24 hour  Intake 989.49 ml  Output 2450 ml  Net -1460.51 ml   Filed Weights   05/17/23 0419 05/18/23 0500 05/19/23 0500  Weight: 62.5 kg 64 kg 61.9 kg   Examination: Physical Exam:  Constitutional: Elderly Caucasian female who is in NAD appears less labored Respiratory: Diminished to auscultation bilaterally with coarse breath sounds and has some rhonchi and crackles.  No appreciable wheezing or rales.  Wearing supplemental oxygen via nasal cannula nocturnal oxygen requirement is weaning Cardiovascular: Irregularly irregular and has a 4-1 AV block.  No murmurs / rubs / gallops. S1 and S2 auscultated.  Mild extremity edema Abdomen: Soft, non-tender, non-distended. Bowel sounds positive.  GU:  Deferred. Musculoskeletal: No clubbing / cyanosis of digits/nails. No joint deformity upper and lower extremities.  Skin: No rashes, lesions, ulcers limited skin evaluation. No induration; Warm and dry.  Neurologic: Remains globally aphasic and has a right facial droop noted.  Does follow some commands but does not really interact Psychiatric: Appears calmer today and is awake and alert but unable to speak  Data Reviewed: I have personally reviewed following labs and imaging studies  CBC: Recent Labs  Lab 05/14/23 0455 05/15/23 0538 05/16/23 0501 05/17/23 0539 05/18/23 0942 05/19/23 0735  WBC 10.6* 8.9 8.8 9.4 7.9 8.1  NEUTROABS 8.9*  --  7.4 8.0* 6.6 6.3  HGB 13.1 10.7* 10.3* 10.9* 10.6* 10.6*  HCT 41.4 34.2* 33.2* 34.2* 33.2* 33.7*  MCV 97.6 99.7 100.0 96.9 97.6 97.7  PLT 170 155 138* 164 169 185   Basic Metabolic Panel:  Recent Labs  Lab 05/13/23 1518 05/15/23 0538 05/16/23 0501 05/17/23 0539 05/17/23 1725 05/18/23 0942 05/18/23 1625 05/19/23 0735  NA  --  140 143 147*  --  147*  --  149*  K  --  4.1 3.8 4.1  --  4.4  --  4.3  CL  --  112* 114* 116*  --  113*  --  111  CO2  --  15* 14* 18*  --  20*  --  25  GLUCOSE  --  91 89 135*  --  132*  --  144*  BUN  --  35* 45* 64*  --  84*  --  101*  CREATININE  --  2.34* 3.61* 4.49*  --  4.77*  --  4.80*  CALCIUM  --  8.6* 8.6* 9.3  --  9.4  --  9.7  MG  --   --  2.0 2.2 2.3 2.5* 2.6* 2.7*  PHOS   < >  --  6.0* 6.6* 6.1* 6.4* 6.7* 6.6*   < > = values in this interval not displayed.   GFR: Estimated Creatinine Clearance: 6.2 mL/min (A) (by C-G formula based on SCr of 4.8 mg/dL (H)). Liver Function Tests: Recent Labs  Lab 05/15/23 0538 05/16/23 0501 05/17/23 0539 05/18/23 0942 05/19/23 0735  AST 33 28 28 20 22   ALT 37 37 39 35 32  ALKPHOS 70 73 82 82 91  BILITOT 0.6 0.6 0.8 0.6 0.7  PROT 5.7* 5.4* 6.2* 5.7* 6.0*  ALBUMIN 3.5 3.2* 3.3* 3.0* 2.9*   No results for input(s): "LIPASE", "AMYLASE" in the last 168  hours. Recent Labs  Lab 05/13/23 1835  AMMONIA 17   Coagulation Profile: Recent Labs  Lab 05/13/23 1518 05/14/23 0455  INR 1.2 1.2   Cardiac Enzymes: Recent Labs  Lab 05/13/23 1518 05/14/23 0455 05/15/23 1244 05/16/23 0501  CKTOTAL 1,135* 472* 311* 327*   BNP (last 3 results) No results for input(s): "PROBNP" in the last 8760 hours. HbA1C: No results for input(s): "HGBA1C" in the last 72 hours. CBG: Recent Labs  Lab 05/19/23 0353 05/19/23 0759 05/19/23 1123 05/19/23 1527 05/19/23 1947  GLUCAP 130* 157* 122* 151* 139*   Lipid Profile: No results for input(s): "CHOL", "HDL", "LDLCALC", "TRIG", "CHOLHDL", "LDLDIRECT" in the last 72 hours. Thyroid Function Tests: No results for input(s): "TSH", "T4TOTAL", "FREET4", "T3FREE", "THYROIDAB" in the last 72 hours. Anemia Panel: No results for input(s): "VITAMINB12", "FOLATE", "FERRITIN", "TIBC", "IRON", "RETICCTPCT" in the last 72 hours. Sepsis Labs: Recent Labs  Lab 05/13/23 1526 05/14/23 0455 05/15/23 1015 05/15/23 1244  LATICACIDVEN 3.2* 2.2* 1.4 1.6   Recent Results (from the past 240 hours)  MRSA Next Gen by PCR, Nasal     Status: Abnormal   Collection Time: 05/14/23 12:59 AM   Specimen: Nasal Mucosa; Nasal Swab  Result Value Ref Range Status   MRSA by PCR Next Gen DETECTED (A) NOT DETECTED Final    Comment: RESULT CALLED TO, READ BACK BY AND VERIFIED WITH: K JONES,RN@0210  05/14/23 MK (NOTE) The GeneXpert MRSA Assay (FDA approved for NASAL specimens only), is one component of a comprehensive MRSA colonization surveillance program. It is not intended to diagnose MRSA infection nor to guide or monitor treatment for MRSA infections. Test performance is not FDA approved in patients less than 18 years old. Performed at [facility], [address], [place], [place] [address]   Culture, blood (Routine X 2) w Reflex to ID Panel  Status: None   Collection Time: 05/14/23  4:42 AM   Specimen: BLOOD   Result Value Ref Range Status   Specimen Description BLOOD SITE NOT SPECIFIED  Final   Special Requests   Final    AEROBIC BOTTLE ONLY Blood Culture results may not be optimal due to an inadequate volume of blood received in culture bottles   Culture   Final    NO GROWTH 5 DAYS Performed at [facility], [address], [place], [place] [address]    Report Status 05/19/2023 FINAL  Final  Culture, blood (Routine X 2) w Reflex to ID Panel     Status: None   Collection Time: 05/14/23  4:55 AM   Specimen: BLOOD RIGHT HAND  Result Value Ref Range Status   Specimen Description BLOOD RIGHT HAND  Final   Special Requests   Final    BOTTLES DRAWN AEROBIC AND ANAEROBIC Blood Culture adequate volume   Culture   Final    NO GROWTH 5 DAYS Performed at [facility], [address], [place], [place] [address]    Report Status 05/19/2023 FINAL  Final  Urine Culture (for pregnant, neutropenic or urologic patients or patients with an indwelling urinary catheter)     Status: Abnormal   Collection Time: 05/14/23  6:22 AM   Specimen: Urine, Clean Catch  Result Value Ref Range Status   Specimen Description URINE, CLEAN CATCH  Final   Special Requests   Final    NONE Performed at [facility], [address] [place], [place] [address]    Culture 10,000 COLONIES/mL ENTEROBACTER CLOACAE (A)  Final   Report Status 05/16/2023 FINAL  Final   Organism ID, Bacteria ENTEROBACTER CLOACAE (A)  Final      Susceptibility   Enterobacter cloacae - MIC*    CEFEPIME <=0.12 SENSITIVE Sensitive     CIPROFLOXACIN <=0.25 SENSITIVE Sensitive     GENTAMICIN <=1 SENSITIVE Sensitive     IMIPENEM 0.5 SENSITIVE Sensitive     NITROFURANTOIN 64 INTERMEDIATE Intermediate     TRIMETH/SULFA <=20 SENSITIVE Sensitive     PIP/TAZO <=4 SENSITIVE Sensitive ug/mL    * 10,000 COLONIES/mL ENTEROBACTER CLOACAE  Resp panel by RT-PCR (RSV, Flu A&B, Covid) Anterior Nasal Swab     Status: None   Collection Time:  05/14/23  6:22 AM   Specimen: Anterior Nasal Swab  Result Value Ref Range Status   SARS Coronavirus 2 by RT PCR NEGATIVE NEGATIVE Final   Influenza A by PCR NEGATIVE NEGATIVE Final   Influenza B by PCR NEGATIVE NEGATIVE Final    Comment: (NOTE) The Xpert Xpress SARS-CoV-2/FLU/RSV plus assay is intended as an aid in the diagnosis of influenza from Nasopharyngeal swab specimens and should not be used as a sole basis for treatment. Nasal washings and aspirates are unacceptable for Xpert Xpress SARS-CoV-2/FLU/RSV testing.  Fact Sheet for Patients: BloggerCourse.com  Fact Sheet for Healthcare Providers: SeriousBroker.it  This test is not yet approved or cleared by the [place] FDA and has been authorized for detection and/or diagnosis of SARS-CoV-2 by FDA under an Emergency Use Authorization (EUA). This EUA will remain in effect (meaning this test can be used) for the duration of the COVID-19 declaration under Section 564(b)(1) of the Act, 21 U.S.C. section 360bbb-3(b)(1), unless the authorization is terminated or revoked.     Resp Syncytial Virus by PCR NEGATIVE NEGATIVE Final    Comment: (NOTE) Fact Sheet for Patients: BloggerCourse.com  Fact Sheet for Healthcare  Providers: SeriousBroker.it  This test is not yet approved or cleared by the [place] and has been authorized for detection and/or diagnosis of SARS-CoV-2 by FDA under an Emergency Use Authorization (EUA). This EUA will remain in effect (meaning this test can be used) for the duration of the COVID-19 declaration under Section 564(b)(1) of the Act, 21 U.S.C. section 360bbb-3(b)(1), unless the authorization is terminated or revoked.  Performed at [facility], [address], [place], [place] [address]      Radiology Studies: DG CHEST PORT 1 VIEW Result Date: 05/19/2023 CLINICAL DATA:  Shortness  of breath EXAM: PORTABLE CHEST 1 VIEW COMPARISON:  05/17/2023 FINDINGS: Dual lead pacemaker remains in place. Soft feeding tube enters the abdomen. Right chest remains largely clear. Persistent and possibly slightly worsened infiltrate/atelectasis in the left lower lobe. Small amount pleural fluid again seen on the left. No acute bone finding. IMPRESSION: Persistent and possibly slightly worsened infiltrate/atelectasis in the left lower lobe. Small amount of pleural fluid on the left. Electronically Signed   By: Paulina Fusi M.D.   On: 05/19/2023 09:12   Scheduled Meds:  arformoterol  15 mcg Nebulization BID   aspirin  81 mg Per Tube Daily   budesonide (PULMICORT) nebulizer solution  0.25 mg Nebulization BID   Chlorhexidine Gluconate Cloth  6 each Topical Daily   diltiazem  60 mg Per Tube Q8H   feeding supplement (PROSource TF20)  60 mL Per Tube Daily   Gerhardt's butt cream   Topical TID   insulin aspart  0-6 Units Subcutaneous Q4H   levothyroxine  137 mcg Per Tube Q0600   metoprolol tartrate  50 mg Per Tube BID   mouth rinse  15 mL Mouth Rinse 4 times per day   sodium bicarbonate  650 mg Per Tube BID   thiamine  100 mg Per Tube Daily   Continuous Infusions:  feeding supplement (OSMOLITE 1.5 CAL) 40 mL/hr at 05/19/23 1700   heparin 650 Units/hr (05/19/23 1700)    LOS: 6 days   Marguerita Merles, DO [facility] Available via Epic secure chat 7am-7pm After these hours, please refer to coverage provider listed on [email or web address] 05/19/2023, 9:07 PM

## 2023-05-19 NOTE — Progress Notes (Signed)
 Patient refusing oral care on all attempts but one. Nurse tech and I were able to brush tongue and teeth once today. Expressed to patient's family the importance of oral care with patients and attempts by son and daughter-in-law failed.

## 2023-05-19 NOTE — Progress Notes (Addendum)
 Rounding Note    Patient Name: Ruth Roth Date of Encounter: 05/19/2023  [facility] Cardiologist: Lorine Bears, MD   Subjective   Aphasic. Mild Tachypnea RR 22 without any accessory muscle use or distress.   Inpatient Medications    Scheduled Meds:  arformoterol  15 mcg Nebulization BID   aspirin  81 mg Per Tube Daily   budesonide (PULMICORT) nebulizer solution  0.25 mg Nebulization BID   Chlorhexidine Gluconate Cloth  6 each Topical Daily   diltiazem  60 mg Per Tube Q8H   feeding supplement (PROSource TF20)  60 mL Per Tube Daily   Gerhardt's butt cream   Topical TID   insulin aspart  0-6 Units Subcutaneous Q4H   ipratropium  0.5 mg Nebulization TID   levalbuterol  0.63 mg Nebulization TID   levothyroxine  137 mcg Per Tube Q0600   metoprolol tartrate  50 mg Per Tube BID   mouth rinse  15 mL Mouth Rinse 4 times per day   sodium bicarbonate  650 mg Per Tube BID   thiamine  100 mg Per Tube Daily   Continuous Infusions:  feeding supplement (OSMOLITE 1.5 CAL) 40 mL/hr at 05/19/23 0900   furosemide Stopped (05/19/23 0804)   heparin 600 Units/hr (05/19/23 0900)   PRN Meds: acetaminophen **OR** acetaminophen (TYLENOL) oral liquid 160 mg/5 mL **OR** acetaminophen, bisacodyl, dextrose, iohexol, levalbuterol, mouth rinse, mouth rinse, polyethylene glycol, senna-docusate   Vital Signs    Vitals:   05/19/23 0600 05/19/23 0700 05/19/23 0800 05/19/23 0822  BP: (!) 151/75 (!) 159/67 (!) 167/65   Pulse: 62 63 62   Resp: 19 19 (!) 23   Temp:   98 F (36.7 C)   TempSrc:   Axillary   SpO2: 95% 97% 100% 98%  Weight:      Height:        Intake/Output Summary (Last 24 hours) at 05/19/2023 0958 Last data filed at 05/19/2023 0900 Gross per 24 hour  Intake 1336.18 ml  Output 3075 ml  Net -1738.82 ml      05/19/2023    5:00 AM 05/18/2023    5:00 AM 05/17/2023    4:19 AM  Last 3 Weights  Weight (lbs) 136 lb 7.4 oz 141 lb 1.5 oz 137 lb 12.6 oz  Weight (kg) 61.9 kg  64 kg 62.5 kg      Telemetry    Atrial flutter 4:1 AV block HR 60's, with occasional ventricular pacing - Personally Reviewed  ECG    None new - Personally Reviewed  Physical Exam   GEN: Elderly female, aphasic in no acute distress. Appear lethargic.  Neck: No JVD Cardiac: Irregular irregular but rate controlled, no murmurs, rubs, or gallops.  Respiratory: Tachypnea RR 22, diminished bilateral lower lobe noted on lateral wall. Clear lung sound in anterior upper chest. Unable to auscultate posteriorly.  GI: Soft, nontender, non-distended  MS: +1 pitting edema; wearing safety mitts on hands.  Neuro:  Nonfocal  Psych: Normal affect   Labs    High Sensitivity Troponin:   Recent Labs  Lab 05/13/23 1518 05/13/23 1835  TROPONINIHS 125* 143*     Chemistry Recent Labs  Lab 05/17/23 0539 05/17/23 1725 05/18/23 0942 05/18/23 1625 05/19/23 0735  NA 147*  --  147*  --  149*  K 4.1  --  4.4  --  4.3  CL 116*  --  113*  --  111  CO2 18*  --  20*  --  25  GLUCOSE 135*  --  132*  --  144*  BUN 64*  --  84*  --  101*  CREATININE 4.49*  --  4.77*  --  4.80*  CALCIUM 9.3  --  9.4  --  9.7  MG 2.2   < > 2.5* 2.6* 2.7*  PROT 6.2*  --  5.7*  --  6.0*  ALBUMIN 3.3*  --  3.0*  --  2.9*  AST 28  --  20  --  22  ALT 39  --  35  --  32  ALKPHOS 82  --  82  --  91  BILITOT 0.8  --  0.6  --  0.7  GFRNONAA 9*  --  8*  --  8*  ANIONGAP 13  --  14  --  13   < > = values in this interval not displayed.    Lipids  Recent Labs  Lab 05/14/23 0455  CHOL 172  TRIG 189*  HDL 67  LDLCALC 67  CHOLHDL 2.6    Hematology Recent Labs  Lab 05/17/23 0539 05/18/23 0942 05/19/23 0735  WBC 9.4 7.9 8.1  RBC 3.53* 3.40* 3.45*  HGB 10.9* 10.6* 10.6*  HCT 34.2* 33.2* 33.7*  MCV 96.9 97.6 97.7  MCH 30.9 31.2 30.7  MCHC 31.9 31.9 31.5  RDW 18.3* 18.0* 18.0*  PLT 164 169 185   Thyroid  Recent Labs  Lab 05/13/23 2022  TSH 8.301*  FREET4 0.65    BNP Recent Labs  Lab 05/13/23 1518  05/16/23 0501  BNP 673.8* 1,268.9*    DDimer No results for input(s): "DDIMER" in the last 168 hours.   Radiology    DG CHEST PORT 1 VIEW Result Date: 05/19/2023 CLINICAL DATA:  Shortness of breath EXAM: PORTABLE CHEST 1 VIEW COMPARISON:  05/17/2023 FINDINGS: Dual lead pacemaker remains in place. Soft feeding tube enters the abdomen. Right chest remains largely clear. Persistent and possibly slightly worsened infiltrate/atelectasis in the left lower lobe. Small amount pleural fluid again seen on the left. No acute bone finding. IMPRESSION: Persistent and possibly slightly worsened infiltrate/atelectasis in the left lower lobe. Small amount of pleural fluid on the left. Electronically Signed   By: Paulina Fusi M.D.   On: 05/19/2023 09:12   DG CHEST PORT 1 VIEW Result Date: 05/17/2023 CLINICAL DATA:  Dyspnea EXAM: PORTABLE CHEST 1 VIEW COMPARISON:  05/17/2023 FINDINGS: Nasoenteric feeding tube extends into the upper abdomen beyond the margin of the examination. Pulmonary insufflation is stable. Mild to moderate bilateral perihilar interstitial pulmonary edema has improved. Small left pleural effusion is stable. No pneumothorax. Cardiac size is within normal limits. Left subclavian dual lead pacemaker is unchanged. IMPRESSION: 1. Improving mild to moderate pulmonary edema. Electronically Signed   By: Helyn Numbers M.D.   On: 05/17/2023 22:11    Cardiac Studies   ECHO 05/16/23  1. Left ventricular ejection fraction, by estimation, is 60 to 65%. The  left ventricle has normal function. The left ventricle has no regional  wall motion abnormalities. There is mild left ventricular hypertrophy.  Left ventricular diastolic parameters  are indeterminate.   2. Pacing leads seen in RA and RV. Right ventricular systolic function is  normal. The right ventricular size is normal.   3. Left atrial size was severely dilated.   4. Right atrial size was severely dilated.   5. The mitral valve is degenerative.  Trivial mitral valve regurgitation.  No evidence of mitral stenosis. Severe mitral annular calcification.  6. The aortic valve was not well visualized. There is moderate  calcification of the aortic valve. There is moderate thickening of the  aortic valve. Aortic valve regurgitation is mild. Moderate aortic valve  stenosis.   7. The inferior vena cava is normal in size with greater than 50%  respiratory variability, suggesting right atrial pressure of 3 mmHg.   Patient Profile     89 y.o. 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 Wilkie Aye.   Assessment & Plan   Atrial Flutter/Fibrillation with RVR - persistent atrial flutter with variable AV block at least since 2021. No cardioversion attempts since 2020.  - this admission presented A-flutter w/ HR 150. Treated with amiodarone IV and d/c once rates improved but continued on dilt and lopressor. - ECHO 05/16/23: LA and RA severely dilated. Trivial MVR. Severe mitral annular calcification. Mild AVR. Moderate Aortic calcification.  - Telemetry: Atrial flutter 4:1 AV block HR 60's, with occasional ventricular pacing - Continue Heparin gtt. With worsening kidney function, can consider Eliquis 2.5 mg BID once PO access established.  - Continue Diltiazem 60 mg q 8 hrs via feeding tube - Continue Lopressor 50 mg BID via feeding tube  PPM  - dual chamber [employer] pacemaker implanted in July 2023 for periods of atrial fibrillation with slow ventricular response, device currently programmed VVI with lower rate limit 60 bpm. Device is MRI compatible.  - ECHO 05/16/23: pacing leads in RA and RV.   Acute on CRF  - Baseline Cr 1.1 -1.2, Currently 4.8  - Na improving 147 > 149. Currently being  supplemented with solidum bicarbonate. - Mg improving 2.5 >2.6 >2.7 - Nephology recommended against dialysis and currently managing.   - Palliative Care been consulted.  Stroke - post IR thrombolysis occluded left MCA  - continue heparin   Acute on Chronic HFpEF - ECHO 05/16/23: LVEF 60-65%. No RWMA. Mild LV hypertrophy.  - BNP: 673 > 1269 - Net I/O since admission: + 3,213. Wt decreased 4 lbs this admission 140 >136 - on exam, +1 pitting edema. Tachypnea RR 22, diminished bilateral lower lobe noted on lateral walls. Clear lung sound in anterior upper chest. Unable to auscultate posteriorly.  - Continue monitoring I/O, daily weights and renal function    Nonobstructive CAD  - ECHO 05/16/23: LVEF 60-65%. No RWMA. Mild LV hypertrophy. Normal LV & RV function. Moderate Aortic calcification - LDL 67 - Continue ASA 81 mg   For questions or updates, please contact [facility] Please consult [email or web address] for contact info under        Signed, Basilio Cairo, PA-C  05/19/2023, 9:58 AM

## 2023-05-19 NOTE — Progress Notes (Signed)
 [facility] NEPHROLOGY PROGRESS NOTE  Assessment/ Plan: Pt is a 89 y.o. yo female  with AKI on CKD3, oliguric after presenting with ischemic CVA undergoing mechanical thrombectomy.   # AKI on CKD 3 likely due to ATN from contrast exposure and acute illness; urine output increased with high-dose diuretics.  The volume status is improving and patient is becoming hypernatremic with elevated BUN.  I am going to stop furosemide.  Continue to monitor urine output and lab. No indication for RRT, nor should it be offered given her comorbidities and age; primary has discussed this with family and by report they are in agreement.  Noted palliative care team was consulted.  # Metabolic acidosis Improved.   #Acute ischemic left MCA CVA status post mechanical thrombectomy 3//25.  # HTN: On cardiac medication including diltiazem and metoprolol.  Monitor BP. #Atrial fib/flutter currently on heparin #Mild hypernatremia, dc lasix.   Discussed with the nursing team.  Subjective: Seen and examined the patient.  She has  aphasia and looks confused.  No family member present.  Discussed with nursing staff.  Urine output is documented 2.8 L in 24 hours.  No other new event. Objective Vital signs in last 24 hours: Vitals:   05/19/23 1144 05/19/23 1200 05/19/23 1300 05/19/23 1400  BP: (!) 149/66 (!) 158/57 (!) 147/58 (!) 136/56  Pulse: (!) 126 85 84 62  Resp: (!) 21 20 (!) 22 20  Temp:  98.6 F (37 C)    TempSrc:  Axillary    SpO2: 97% 99% 98% 96%  Weight:      Height:       Weight change: -2.1 kg  Intake/Output Summary (Last 24 hours) at 05/19/2023 1517 Last data filed at 05/19/2023 1300 Gross per 24 hour  Intake 1245.26 ml  Output 3475 ml  Net -2229.74 ml       Labs: RENAL PANEL Recent Labs  Lab 05/13/23 1518 05/15/23 0538 05/16/23 0501 05/17/23 0539 05/17/23 1725 05/18/23 0942 05/18/23 1625 05/19/23 0735  NA  --  140 143 147*  --  147*  --  149*  K  --  4.1 3.8 4.1   --  4.4  --  4.3  CL  --  112* 114* 116*  --  113*  --  111  CO2  --  15* 14* 18*  --  20*  --  25  GLUCOSE  --  91 89 135*  --  132*  --  144*  BUN  --  35* 45* 64*  --  84*  --  101*  CREATININE  --  2.34* 3.61* 4.49*  --  4.77*  --  4.80*  CALCIUM  --  8.6* 8.6* 9.3  --  9.4  --  9.7  MG  --   --  2.0 2.2 2.3 2.5* 2.6* 2.7*  PHOS   < >  --  6.0* 6.6* 6.1* 6.4* 6.7* 6.6*  ALBUMIN  --  3.5 3.2* 3.3*  --  3.0*  --  2.9*   < > = values in this interval not displayed.    Liver Function Tests: Recent Labs  Lab 05/17/23 0539 05/18/23 0942 05/19/23 0735  AST 28 20 22   ALT 39 35 32  ALKPHOS 82 82 91  BILITOT 0.8 0.6 0.7  PROT 6.2* 5.7* 6.0*  ALBUMIN 3.3* 3.0* 2.9*   No results for input(s): "LIPASE", "AMYLASE" in the last 168 hours. Recent Labs  Lab 05/13/23 1835  AMMONIA 17   CBC:  Recent Labs    07/04/22 1006 09/18/22 1254 11/18/22 1558 03/24/23 1315 04/10/23 1752 05/15/23 0538 05/16/23 0501 05/17/23 0539 05/18/23 0942 05/19/23 0735  HGB 12.4 11.9*   < > 12.1   < > 10.7* 10.3* 10.9* 10.6* 10.6*  MCV 101.2* 103.7*   < > 98.8   < > 99.7 100.0 96.9 97.6 97.7  VITAMINB12  --  846  --  616  --   --   --   --   --   --   FOLATE  --  >40.0  --  >40.0  --   --   --   --   --   --   FERRITIN 26.4 22  --  18  --   --   --   --   --   --   TIBC 457.8* 490*  --  580*  --   --   --   --   --   --   IRON 65 73  --  74  --   --   --   --   --   --    < > = values in this interval not displayed.    Cardiac Enzymes: Recent Labs  Lab 05/13/23 1518 05/14/23 0455 05/15/23 1244 05/16/23 0501  CKTOTAL 1,135* 472* 311* 327*   CBG: Recent Labs  Lab 05/18/23 2020 05/18/23 2356 05/19/23 0353 05/19/23 0759 05/19/23 1123  GLUCAP 139* 140* 130* 157* 122*    Iron Studies: No results for input(s): "IRON", "TIBC", "TRANSFERRIN", "FERRITIN" in the last 72 hours. Studies/Results: DG CHEST PORT 1 VIEW Result Date: 05/19/2023 CLINICAL DATA:  Shortness of breath EXAM: PORTABLE  CHEST 1 VIEW COMPARISON:  05/17/2023 FINDINGS: Dual lead pacemaker remains in place. Soft feeding tube enters the abdomen. Right chest remains largely clear. Persistent and possibly slightly worsened infiltrate/atelectasis in the left lower lobe. Small amount pleural fluid again seen on the left. No acute bone finding. IMPRESSION: Persistent and possibly slightly worsened infiltrate/atelectasis in the left lower lobe. Small amount of pleural fluid on the left. Electronically Signed   By: Paulina Fusi M.D.   On: 05/19/2023 09:12   DG CHEST PORT 1 VIEW Result Date: 05/17/2023 CLINICAL DATA:  Dyspnea EXAM: PORTABLE CHEST 1 VIEW COMPARISON:  05/17/2023 FINDINGS: Nasoenteric feeding tube extends into the upper abdomen beyond the margin of the examination. Pulmonary insufflation is stable. Mild to moderate bilateral perihilar interstitial pulmonary edema has improved. Small left pleural effusion is stable. No pneumothorax. Cardiac size is within normal limits. Left subclavian dual lead pacemaker is unchanged. IMPRESSION: 1. Improving mild to moderate pulmonary edema. Electronically Signed   By: Helyn Numbers M.D.   On: 05/17/2023 22:11    Medications: Infusions:  feeding supplement (OSMOLITE 1.5 CAL) 40 mL/hr at 05/19/23 1300   furosemide Stopped (05/19/23 0804)   heparin 650 Units/hr (05/19/23 1300)    Scheduled Medications:  arformoterol  15 mcg Nebulization BID   aspirin  81 mg Per Tube Daily   budesonide (PULMICORT) nebulizer solution  0.25 mg Nebulization BID   Chlorhexidine Gluconate Cloth  6 each Topical Daily   diltiazem  60 mg Per Tube Q8H   feeding supplement (PROSource TF20)  60 mL Per Tube Daily   Gerhardt's butt cream   Topical TID   insulin aspart  0-6 Units Subcutaneous Q4H   levothyroxine  137 mcg Per Tube Q0600   metoprolol tartrate  50 mg Per Tube BID   mouth rinse  15  mL Mouth Rinse 4 times per day   sodium bicarbonate  650 mg Per Tube BID   thiamine  100 mg Per Tube Daily     have reviewed scheduled and prn medications.  Physical Exam: General:NAD, comfortable Heart:RRR, s1s2 nl Lungs:clear b/l, no crackle Abdomen:soft, Non-tender, non-distended Extremities:No edema Neurology: Aphasia and confused. Dontai Pember Prasad Sasha Rogel 05/19/2023,3:17 PM  LOS: 6 days

## 2023-05-19 NOTE — Progress Notes (Signed)
 Physical Therapy Treatment Patient Details Name: Ruth Roth MRN: [id number] DOB: 06-05-1928 Today's Date: 05/19/2023   History of Present Illness 89 year old female admitted 05/14/23 after being found down at home with R sided weakness, incontinent. CTA head and neck showed occluded versus severe stenosis left M1 MCA with a symmetrical diminished distal left MCA opacification. X rays negative for acute fx. Code stroke activated in the ED with increasing dysarthria. S/p IR revascularization. Dx with UTI and sepsis. Chest x-ray evidence of pneumonia. PMH includes HFpEF, pacemaker (July 2023), atrial fibrillation (on Xarelto), Autoimmune hepatitis, Fibrocystic breast disease, Glaucoma, Hypercholesterolemia, Hypertension, Hypothyroidism, Inflammatory arthritis, CAD, Mitral regurgitation, Neuropathy, Osteoarthritis and PUD.    PT Comments  The pt was resting upon arrival of PT, opens eyes to stimulation but did not follow commands or attempt to mimic PT gestures throughout session. The pt tolerated assist to reach sitting EOB with VSS, but is dependent on assist of 2 to complete and maintain sitting balance. The pt needs max cues and encouragement to lift head and attend to PT, and did not follow commands for reaching or LE movements, but did move BLE spontaneously when sitting EOB. The pt continues to need significant assist to complete sit-stand, she attempted to assist with LLE, holding therapist on L side, but required totalA on R side to block knee, assist with hip extension, and max cues for head and neck posture. Continue to recommend inpatient follow up therapy, <3 hours/day to facilitate return to maximal functional mobility.     If plan is discharge home, recommend the following: Two people to help with walking and/or transfers;Two people to help with bathing/dressing/bathroom;Help with stairs or ramp for entrance;Assist for transportation;Assistance with cooking/housework   Can travel by private  vehicle     No  Equipment Recommendations  Other (comment) (TBD, currently lift, WC, and hospital bed)    Recommendations for Other Services       Precautions / Restrictions Precautions Precautions: Fall Recall of Precautions/Restrictions: Impaired Precaution/Restrictions Comments: global aphasia, cortrak, foley Restrictions Weight Bearing Restrictions Per Provider Order: No     Mobility  Bed Mobility Overal bed mobility: Needs Assistance Bed Mobility: Supine to Sit     Supine to sit: Max assist, +2 for physical assistance, +2 for safety/equipment, HOB elevated     General bed mobility comments: no initiation, use of bed pad to facilitate, assit for trunk elevation and BLE off the bed    Transfers Overall transfer level: Needs assistance Equipment used: 2 person hand held assist Transfers: Sit to/from Stand Sit to Stand: Max assist, +2 physical assistance, Total assist           General transfer comment: pt attempting to assist with LLE, holding therapist on L side, totalA on R side to block knee, assist with hip extension, and max cues for head and neck posture. pt with forward flexed position    Ambulation/Gait               General Gait Details: pt unable   Modified Rankin (Stroke Patients Only) Modified Rankin (Stroke Patients Only) Pre-Morbid Rankin Score: No symptoms Modified Rankin: Severe disability     Balance Overall balance assessment: Needs assistance Sitting-balance support: Bilateral upper extremity supported, Feet supported Sitting balance-Leahy Scale: Poor Sitting balance - Comments: min A to GCA Postural control: Posterior lean Standing balance support: Bilateral upper extremity supported Standing balance-Leahy Scale: Zero Standing balance comment: dependent on external assist  Communication Communication Communication: Impaired Factors Affecting Communication: Difficulty expressing self   Cognition Arousal: Lethargic Behavior During Therapy: Flat affect   PT - Cognitive impairments: Difficult to assess Difficult to assess due to: Impaired communication                     PT - Cognition Comments: pt with eyes closed at times, opening eyes and attending to stimulation with max stimulation. not following cues or mimicing PT movements Following commands: Impaired Following commands impaired:  (does not follow commands)    Cueing Cueing Techniques: Verbal cues, Gestural cues, Tactile cues, Visual cues  Exercises Other Exercises Other Exercises: attempted sitting reaching x5 PROM Other Exercises: lateral leans x3 with assist to maintain position andpt initiating to push up to midline    General Comments General comments (skin integrity, edema, etc.): VSS on 6L O2, no family present      Pertinent Vitals/Pain Pain Assessment Pain Assessment: Faces Faces Pain Scale: No hurt Pain Intervention(s): Monitored during session     PT Goals (current goals can now be found in the care plan section) Acute Rehab PT Goals Patient Stated Goal: none stated, pt not verbalizing PT Goal Formulation: With patient/family Time For Goal Achievement: 05/29/23 Potential to Achieve Goals: Fair Progress towards PT goals: Progressing toward goals    Frequency    Min 2X/week           Co-evaluation   Reason for Co-Treatment: Complexity of the patient's impairments (multi-system involvement);Necessary to address cognition/behavior during functional activity;For patient/therapist safety;To address functional/ADL transfers PT goals addressed during session: Mobility/safety with mobility;Balance;Proper use of DME;Strengthening/ROM OT goals addressed during session: Strengthening/ROM;ADL's and self-care      AM-PAC PT "6 Clicks" Mobility   Outcome Measure  Help needed turning from your back to your side while in a flat bed without using bedrails?: A Lot Help needed moving  from lying on your back to sitting on the side of a flat bed without using bedrails?: Total Help needed moving to and from a bed to a chair (including a wheelchair)?: Total Help needed standing up from a chair using your arms (e.g., wheelchair or bedside chair)?: Total Help needed to walk in hospital room?: Total Help needed climbing 3-5 steps with a railing? : Total 6 Click Score: 7    End of Session Equipment Utilized During Treatment: Gait belt;Oxygen Activity Tolerance: Patient tolerated treatment well;Patient limited by fatigue Patient left: in bed;with call bell/phone within reach;with bed alarm set Nurse Communication: Mobility status PT Visit Diagnosis: Other abnormalities of gait and mobility (R26.89);Muscle weakness (generalized) (M62.81);Other symptoms and signs involving the nervous system (R29.898)     Time: 4332-9518 PT Time Calculation (min) (ACUTE ONLY): 24 min  Charges:    $Neuromuscular Re-education: 8-22 mins PT General Charges $$ ACUTE PT VISIT: 1 Visit                     Vickki Muff, PT, DPT   Acute Rehabilitation Department Office [phone] Secure Chat Communication Preferred   Ronnie Derby 05/19/2023, 1:36 PM

## 2023-05-19 NOTE — Consult Note (Signed)
 Consultation Note Date: 05/19/2023   Patient Name: Ruth Roth  DOB: [birth year]  MRN: [id number]  Age / Sex: 89 y.o., female  PCP: Dale Rudolph, MD Referring Physician: Merlene Laughter, DO  Reason for Consultation: Establishing goals of care  HPI/Patient Profile: 89 y.o. female  with past medical history of HFpEF, CAD, AF and Aflutter on Xarelto, s/p pacemaker, CKD stage 3a, cirrhosis d/t autoimmune hepatitis admitted on 05/13/2023 with found down at home with stroke and fall.   Clinical Assessment and Goals of Care: Consult received and chart review completed. I met today with Ruth Roth and [employer]. But no family at bedside. [name] shares that family plans to be here later today. Ruth Roth is awake but falls back asleep while I am at bedside. She follows some commands for nursing but not consistently.   I met today with son and daughter-in-law, JV and [name]. We review Ruth Roth's progress and overall poor prognosis. We discussed aphasia and dysphagia and poor prognosis for improvement. We reviewed ongoing renal failure. We discussed what all these pieces mean for overall prognosis and quality of life. We discussed dialysis and that she is not a good candidate - family is accepting of this. We discussed poor prognosis and anticipation of ongoing renal failure and what this means. We discussed anticipation of more lethargy, confusion, and more symptoms to manage with progression. We discussed overall poor prognosis and what Ruth Roth would want of not want. DNR/DNI decided after discussion. Family shares that she has been very against hospice placement. She had  had many friends that have died at hospice. She has had a daughter that chose to go to hospice facility (and discharged - she is deceased now) and she very much tried to talk her daughter out of leaving hospice. Family expresses that they know she would  never want to live as a dependent at a facility but at the same time has requested all to be done to prolong life. She does have long term care insurance policy and family will review and consider potential home with hospice support in the future depending on progression. They plan to speak with their children as well as nephews. I have shared my contact number for them and to share with grandchildren as needed. I have also educated on my availability to meet or discuss with any family as needed.   All questions/concerns addressed. Emotional support provided. Emotional support provided. Updated RN and Dr. Marland Mcalpine.   Primary Decision Maker NEXT OF KIN son    SUMMARY OF RECOMMENDATIONS   - DNR/DNI - Continue current care - Ongoing GOC conversations - Comfort is priority  Code Status/Advance Care Planning: DNR   Symptom Management:  Per attending  Prognosis:  Prognosis poor s/p stroke and now renal failure.   Discharge Planning: To Be Determined      Primary Diagnoses: Present on Admission:  Chronic diastolic CHF (congestive heart failure) (HCC)  Chronic liver disease and cirrhosis (HCC)  Hypothyroidism  Anemia due to chronic kidney  disease  Stroke (cerebrum) (HCC)  Middle cerebral artery embolism, left   I have reviewed the medical record, interviewed the patient and family, and examined the patient. The following aspects are pertinent.  Past Medical History:  Diagnosis Date   (HFpEF) heart failure with preserved ejection fraction (HCC)    a. 07/2017 Echo: EF 50-55%, no rwma, mild AS/AI, mod MR, mod dil LA, mod TR, PASP ; b. 03/2021 Echo: EF 60-65%. No rwma. Nl RV fxn. Sev BAE. Mild MR. Mild AI/AS.   Atrial fibrillation (HCC)    Autoimmune hepatitis (HCC)    followed by Dr Marva Panda   CHF (congestive heart failure) [facility])    Fibrocystic breast disease    Glaucoma    Hypercholesterolemia    Hypertension    Hypothyroidism    Inflammatory arthritis    MI (myocardial  infarction) (HCC)    Mitral regurgitation    a. 07/2017 Echo: Mod MR; b. 08/2017 Cath: 4+/Severe MR; c. 08/2017 TEE: Mild MR; d. 03/2021 Echo: Mild MR.   Neuropathy    Non-obstructive Coronary artery disease    a. 05/2014 NSTEMI/Cath: LM nl, LAD mild dzs, LCX 60ost hazy (? culprit), RCA mild dzs. EF 60% by echo-->Med Rx; b. 08/2017 Cath: LM 30ost, LAD min irregs, LCX small, 40ost/p, RCA large, min irregs, RPDA/RPL1 nl, EF 50-55%. 4+MR.   Osteoarthritis    Permanent atrial fibrillation (HCC)    a. CHA2DS2VASc = 6-->Xarelto.   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   Social History   Socioeconomic History   Marital status: Widowed    Spouse name: Not on file   Number of children: 2   Years of education: Not on file   Highest education level: Not on file  Occupational History   Occupation: retired  Tobacco Use   Smoking status: Never   Smokeless tobacco: Never   Tobacco comments:    Never smoke 01/23/22  Vaping Use   Vaping status: Never Used  Substance and Sexual Activity   Alcohol use: No    Alcohol/week: 0.0 standard drinks of alcohol   Drug use: No   Sexual activity: Never  Other Topics Concern   Not on file  Social History Narrative   Not on file   Social Drivers of Health   Financial Resource Strain: Low Risk  (06/17/2022)   Overall Financial Resource Strain (CARDIA)    Difficulty of Paying Living Expenses: Not hard at all  Food Insecurity: Patient Unable To Answer (05/15/2023)   Hunger Vital Sign    Worried About Running Out of Food in the Last Year: Patient unable to answer    Ran Out of Food in the Last Year: Patient unable to answer  Transportation Needs: Patient Unable To Answer (05/15/2023)   PRAPARE - Transportation    Lack of Transportation (Medical): Patient unable to answer    Lack of Transportation (Non-Medical): Patient unable to answer  Physical Activity: Insufficiently Active (06/17/2022)   Exercise Vital  Sign    Days of Exercise per Week: 3 days    Minutes of Exercise per Session: 40 min  Stress: No Stress Concern Present (06/17/2022)   [employer] of Occupational Health - Occupational Stress Questionnaire    Feeling of Stress : Not at all  Social Connections: Unknown (05/15/2023)   Social Connection and Isolation Panel [NHANES]    Frequency of Communication with Friends and Family: Patient unable to answer    Frequency of Social Gatherings  with Friends and Family: Patient unable to answer    Attends Religious Services: Patient unable to answer    Active Member of Clubs or Organizations: Patient unable to answer    Attends Banker Meetings: Not on file    Marital Status: Patient unable to answer   Family History  Problem Relation Age of Onset   Stroke Mother    Esophageal cancer Brother        also had lung cancer   Ovarian cancer Daughter    Colon cancer Daughter    Breast cancer Neg Hx    Scheduled Meds:  arformoterol  15 mcg Nebulization BID   aspirin  81 mg Per Tube Daily   budesonide (PULMICORT) nebulizer solution  0.25 mg Nebulization BID   Chlorhexidine Gluconate Cloth  6 each Topical Daily   diltiazem  60 mg Per Tube Q8H   feeding supplement (PROSource TF20)  60 mL Per Tube Daily   Gerhardt's butt cream   Topical TID   insulin aspart  0-6 Units Subcutaneous Q4H   ipratropium  0.5 mg Nebulization TID   levalbuterol  0.63 mg Nebulization TID   levothyroxine  137 mcg Per Tube Q0600   metoprolol tartrate  50 mg Per Tube BID   mouth rinse  15 mL Mouth Rinse 4 times per day   sodium bicarbonate  650 mg Per Tube BID   thiamine  100 mg Per Tube Daily   Continuous Infusions:  feeding supplement (OSMOLITE 1.5 CAL) 40 mL/hr at 05/19/23 1000   furosemide Stopped (05/19/23 0804)   heparin 600 Units/hr (05/19/23 1000)   PRN Meds:.acetaminophen **OR** acetaminophen (TYLENOL) oral liquid 160 mg/5 mL **OR** acetaminophen, bisacodyl, dextrose, iohexol, levalbuterol,  mouth rinse, mouth rinse, polyethylene glycol, senna-docusate Allergies  Allergen Reactions   Statins Other (See Comments)    Liver damage    Hydroxychloroquine Hives   Haldol [Haloperidol Lactate] Rash   Librax [Chlordiazepoxide-Clidinium] Rash   Plavix [Clopidogrel Bisulfate] Rash   Ramipril Rash   Review of Systems  Unable to perform ROS: Acuity of condition    Physical Exam Nursing note reviewed.  Constitutional:      General: She is not in acute distress.    Appearance: She is ill-appearing.  Cardiovascular:     Rate and Rhythm: Normal rate.  Pulmonary:     Effort: No tachypnea, accessory muscle usage or respiratory distress.  Abdominal:     Palpations: Abdomen is soft.  Neurological:     Mental Status: She is alert.     Comments: Aphasia; seems confused     Vital Signs: BP (!) 158/65   Pulse 60   Temp 98 F (36.7 C) (Axillary)   Resp (!) 21   Ht 5\' 3"  (1.6 m)   Wt 61.9 kg   SpO2 94%   BMI 24.17 kg/m  Pain Scale: CPOT       SpO2: SpO2: 94 % O2 Device:SpO2: 94 % O2 Flow Rate: .O2 Flow Rate (L/min): 6 L/min  IO: Intake/output summary:  Intake/Output Summary (Last 24 hours) at 05/19/2023 1023 Last data filed at 05/19/2023 1000 Gross per 24 hour  Intake 1336.18 ml  Output 3075 ml  Net -1738.82 ml    LBM: Last BM Date : 05/19/23 Baseline Weight: Weight: 63.5 kg Most recent weight: Weight: 61.9 kg     Palliative Assessment/Data:    Time Total: 75 min  Greater than 50%  of this time was spent counseling and coordinating care related to the above  assessment and plan.  Signed by: Yong Channel, NP Palliative Medicine Team Pager # [phone] (M-F 8a-5p) Team Phone # [phone] (Nights/Weekends)

## 2023-05-19 NOTE — TOC Progression Note (Signed)
 Transition of Care [facility]) - Progression Note    Patient Details  Name: Ruth Roth MRN: [id number] Date of Birth: Mar 08, 1929  [facility]) CM/SW Contact  Mearl Latin, LCSW Phone Number: 05/19/2023, 9:26 AM  Clinical Narrative:    CSW following for medical stability for SNF referrals. Still with [facility].    Expected Discharge Plan: Skilled Nursing Facility Barriers to Discharge: Continued Medical Work up, SNF Pending bed offer  Expected Discharge Plan and Services In-house Referral: Clinical Social Work   Post Acute Care Choice: Skilled Nursing Facility Living arrangements for the past 2 months: Single Family Home                                       Social Determinants of Health (SDOH) Interventions SDOH Screenings   Food Insecurity: Patient Unable To Answer (05/15/2023)  Housing: Patient Unable To Answer (05/15/2023)  Transportation Needs: Patient Unable To Answer (05/15/2023)  Utilities: Patient Unable To Answer (05/15/2023)  Depression (PHQ2-9): Low Risk  (12/19/2022)  Financial Resource Strain: Low Risk  (06/17/2022)  Physical Activity: Insufficiently Active (06/17/2022)  Social Connections: Unknown (05/15/2023)  Stress: No Stress Concern Present (06/17/2022)  Tobacco Use: Low Risk  (05/13/2023)    Readmission Risk Interventions     No data to display

## 2023-05-19 NOTE — Progress Notes (Signed)
 Speech Language Pathology Treatment: Dysphagia  Patient Details Name: Ruth Roth MRN: [id number] DOB: 03/30/1928 Today's Date: 05/19/2023 Time: 8295-6213 SLP Time Calculation (min) (ACUTE ONLY): 13 min  Assessment / Plan / Recommendation Clinical Impression  Pt was seen for dysphagia and aphasia tx.  Difficult situation. She is not allowing oral care, turning her head away and sealing lips together when nursing makes efforts.  During my session, my efforts were also not successful.  Left mitt removed and cup with water was placed in pt's hand with support from clinician in hopes that she would be motivated to drink. She declined, turning head away from approaching cup; she repeated this response with spoons of water, offers of ice chips, and further attempts at oral care.  She followed no commands and did not attempt to vocalize when given multimodal cues to stimulate communication. Efforts to clean her mouth or give her sips of water are not accepted.  Prognosis for recovery of swallowing and communication are poor.    D/W RN.   SLP will continue efforts.   HPI HPI: Ruth Roth is a 89 y.o. female who presented to the ED from home on 05/14/23 after being found down at home, awake but verbally unresponsive and with right sided weakness. Dx L CVA. S/P thrombectomy 3/5. Intubated 3/5-3/5. MRI: Many acute Infarcts throughout the left MCA territory.  Associated edema without substantial mass effect. No midline shift.  Remote left PCA territory infarct. PMHx of HFpEF, pacemaker, atrial fibrillation (on Xarelto), Autoimmune hepatitis, Glaucoma, Hypercholesterolemia, Hypertension, Hypothyroidism, Inflammatory arthritis, CAD, Mitral regurgitation, Neuropathy, Osteoarthritis and PUD      SLP Plan  Continue with current plan of care      Recommendations for follow up therapy are one component of a multi-disciplinary discharge planning process, led by the attending physician.  Recommendations may be  updated based on patient status, additional functional criteria and insurance authorization.    Recommendations  Diet recommendations: NPO                  Oral care prior to ice chip/H20   Frequent or constant Supervision/Assistance Aphasia (R47.01);Dysphagia, oropharyngeal phase (R13.12)     Continue with current plan of care    Ruth Serio L. Samson Frederic, MA CCC/SLP Clinical Specialist - Acute Care SLP Acute Rehabilitation Services Office number [phone]  Ruth Roth  05/19/2023, 2:46 PM

## 2023-05-19 NOTE — Progress Notes (Signed)
 Occupational Therapy Treatment Patient Details Name: Ruth Roth MRN: [id number] DOB: [birth year] Today's Date: 05/19/2023   History of present illness 89 year old female admitted 05/14/23 after being found down at home with R sided weakness, incontinent. CTA head and neck showed occluded versus severe stenosis left M1 MCA with a symmetrical diminished distal left MCA opacification. X rays negative for acute fx. Code stroke activated in the ED with increasing dysarthria. S/p IR revascularization. Dx with UTI and sepsis. Chest x-ray evidence of pneumonia. PMH includes HFpEF, pacemaker (July 2023), atrial fibrillation (on Xarelto), Autoimmune hepatitis, Fibrocystic breast disease, Glaucoma, Hypercholesterolemia, Hypertension, Hypothyroidism, Inflammatory arthritis, CAD, Mitral regurgitation, Neuropathy, Osteoarthritis and PUD.   OT comments  Pt progressing slowly toward established OT goals. Limited by aphasia and ability to follow commands this session as well as verbally express self. Challenging balance, safety, command following, and engagement in therapeutic activity. RN reports pt resistant to self care. Worked on bed mobility, sitting balance EOB, and lateral lean to L and to R. Pt with fair initiation to return to midline after lateral leans. Engaged in reaching with hand over hand as well as provided multimodal cueing for posture at EOB, with pt needing up to max multimodal cues. Will continue to follow. Patient will benefit from continued inpatient follow up therapy, <3 hours/day       If plan is discharge home, recommend the following:  Two people to help with walking and/or transfers;A lot of help with bathing/dressing/bathroom;Assistance with feeding;Assistance with cooking/housework;Direct supervision/assist for medications management;Direct supervision/assist for financial management;Assist for transportation;Help with stairs or ramp for entrance;Supervision due to cognitive status    Equipment Recommendations  Wheelchair (measurements OT);Wheelchair cushion (measurements OT);BSC/3in1 (defer to next venue of care)    Recommendations for Other Services      Precautions / Restrictions Precautions Precautions: Fall Recall of Precautions/Restrictions: Impaired Precaution/Restrictions Comments: global aphasia, [facility], foley Restrictions Weight Bearing Restrictions Per Provider Order: No       Mobility Bed Mobility Overal bed mobility: Needs Assistance Bed Mobility: Supine to Sit     Supine to sit: Max assist, +2 for physical assistance, +2 for safety/equipment, HOB elevated     General bed mobility comments: no initiation, use of bed pad to facilitate, assit for trunk elevation and BLE off the bed    Transfers Overall transfer level: Needs assistance Equipment used: 2 person hand held assist Transfers: Sit to/from Stand Sit to Stand: Max assist, +2 physical assistance, Total assist           General transfer comment: pt attempting to assist with LLE, holding therapist on L side, totalA on R side to block knee, assist with hip extension, and max cues for head and neck posture. pt with forward flexed position     Balance Overall balance assessment: Needs assistance Sitting-balance support: Bilateral upper extremity supported, Feet supported Sitting balance-Leahy Scale: Poor Sitting balance - Comments: min A to GCA Postural control: Posterior lean Standing balance support: Bilateral upper extremity supported Standing balance-Leahy Scale: Zero Standing balance comment: dependent on external assist                           ADL either performed or assessed with clinical judgement   ADL Overall ADL's : Needs assistance/impaired  General ADL Comments: RN reports pt max refusal for grooming tasks. Session focused on ther-ex    Extremity/Trunk Assessment Upper Extremity  Assessment Upper Extremity Assessment: Generalized weakness;RUE deficits/detail RUE Deficits / Details: edema present, overall seems flaccid - will let it drop out of the air, no direction following, once did very brief potentially recoil of biceps RUE Sensation:  (pt unable to report but limited response when provided tactle stim; suspect decr sensation) RUE Coordination: decreased fine motor;decreased gross motor   Lower Extremity Assessment Lower Extremity Assessment: Defer to PT evaluation        Vision   Additional Comments: poor eye contact with attempts to track <20%. Pt with very short bouts of visual attention to task or therapist   Perception Perception Perception: Impaired Preception Impairment Details: Inattention/Neglect Perception-Other Comments: R inattention. Will scan past midline inconsistently   Praxis Praxis Praxis: Impaired Praxis-Other Comments: globally aphasic   Communication Communication Communication: Impaired Factors Affecting Communication: Difficulty expressing self   Cognition Arousal: Lethargic Behavior During Therapy: Flat affect Cognition: Cognition impaired             OT - Cognition Comments: global aphasia with no verbalizations throughout. pt not following commands but is receptive to ~<30% of multimodal cues.                 Following commands: Impaired Following commands impaired:  (does not follow commands)      Cueing   Cueing Techniques: Verbal cues, Gestural cues, Tactile cues, Visual cues  Exercises Exercises: Other exercises Other Exercises Other Exercises: attempted sitting reaching x5 PROM with pt very limited intitiation. pulling away from therapist 1x Other Exercises: lateral leans x3 with assist to maintain position and pt initiating to push up to midline bilaterally. Poor attempts to catch or reposition self leaning toward L but resistive toward R    Shoulder Instructions       General Comments VSS on 6L  O2, no family present    Pertinent Vitals/ Pain       Pain Assessment Pain Assessment: Faces Faces Pain Scale: No hurt Pain Intervention(s): Monitored during session  Home Living                                          Prior Functioning/Environment              Frequency  Min 2X/week        Progress Toward Goals  OT Goals(current goals can now be found in the care plan section)  Progress towards OT goals: Progressing toward goals  Acute Rehab OT Goals Patient Stated Goal: none stated OT Goal Formulation: Patient unable to participate in goal setting Time For Goal Achievement: 05/29/23 Potential to Achieve Goals: Fair ADL Goals Pt Will Perform Grooming: with min assist;with caregiver independent in assisting;sitting Pt Will Perform Upper Body Dressing: with mod assist;with caregiver independent in assisting;sitting Pt Will Perform Lower Body Dressing: with max assist;with caregiver independent in assisting;sit to/from stand Pt Will Transfer to Toilet: with mod assist;stand pivot transfer;bedside commode Pt Will Perform Toileting - Clothing Manipulation and hygiene: with max assist;sit to/from stand;with caregiver independent in assisting  Plan      Co-evaluation    PT/OT/SLP Co-Evaluation/Treatment: Yes Reason for Co-Treatment: Complexity of the patient's impairments (multi-system involvement);Necessary to address cognition/behavior during functional activity;For patient/therapist safety;To address functional/ADL transfers PT goals addressed during session: Mobility/safety  with mobility;Balance;Proper use of DME;Strengthening/ROM OT goals addressed during session: Strengthening/ROM;ADL's and self-care      AM-PAC OT "6 Clicks" Daily Activity     Outcome Measure   Help from another person eating meals?: Total Help from another person taking care of personal grooming?: Total Help from another person toileting, which includes using toliet,  bedpan, or urinal?: Total Help from another person bathing (including washing, rinsing, drying)?: Total Help from another person to put on and taking off regular upper body clothing?: Total Help from another person to put on and taking off regular lower body clothing?: Total 6 Click Score: 6    End of Session Equipment Utilized During Treatment: Gait belt;Oxygen (6L)  OT Visit Diagnosis: Unsteadiness on feet (R26.81);Other abnormalities of gait and mobility (R26.89);Muscle weakness (generalized) (M62.81);History of falling (Z91.81);Other symptoms and signs involving the nervous system (R29.898);Other symptoms and signs involving cognitive function;Cognitive communication deficit (R41.841);Hemiplegia and hemiparesis Symptoms and signs involving cognitive functions: Other cerebrovascular disease Hemiplegia - Right/Left: Right Hemiplegia - dominant/non-dominant: Dominant Hemiplegia - caused by: Other cerebrovascular disease   Activity Tolerance Patient tolerated treatment well   Patient Left in chair;with call bell/phone within reach;with chair alarm set;with family/visitor present   Nurse Communication Mobility status;Precautions        Time: 1610-9604 OT Time Calculation (min): 24 min  Charges: OT General Charges $OT Visit: 1 Visit OT Treatments $Therapeutic Activity: 8-22 mins  Tyler Deis, OTR/L [facility] Acute Rehabilitation Office: [phone]   Myrla Halsted 05/19/2023, 1:56 PM

## 2023-05-19 NOTE — Progress Notes (Signed)
 PHARMACY - ANTICOAGULATION CONSULT NOTE  Pharmacy Consult for heparin Indication: atrial fibrillation  Allergies  Allergen Reactions   Statins Other (See Comments)    Liver damage    Hydroxychloroquine Hives   Haldol [Haloperidol Lactate] Rash   Librax [Chlordiazepoxide-Clidinium] Rash   Plavix [Clopidogrel Bisulfate] Rash   Ramipril Rash    Patient Measurements: Height: 5\' 3"  (160 cm) Weight: 61.9 kg (136 lb 7.4 oz) IBW/kg (Calculated) : 52.4 Heparin Dosing Weight: 63.5kg  Vital Signs: Temp: 97.7 F (36.5 C) (03/10 0400) Temp Source: Axillary (03/10 0400) BP: 151/75 (03/10 0600) Pulse Rate: 62 (03/10 0600)  Labs: Recent Labs    05/16/23 2126 05/17/23 0539 05/17/23 1444 05/18/23 0942  HGB  --  10.9*  --  10.6*  HCT  --  34.2*  --  33.2*  PLT  --  164  --  169  APTT 117*  --   --   --   HEPARINUNFRC 0.78* 0.62 0.41 0.33  CREATININE  --  4.49*  --  4.77*    Estimated Creatinine Clearance: 6.2 mL/min (A) (by C-G formula based on SCr of 4.77 mg/dL (H)).   Assessment: 89 yo F with PMH of HFpEF, nonobstructive coronary disease, permanent atrial fibrillation/flutter on Xarelto PTA (unknown last dose, last fill 02/10/23 for 90DS), CKD 3, and cirrhosis admitted for L MCA stroke. Underwent thrombectomy 3/5 achieving TICI3 revascularization. Stroke team cleared her to be on anticoagulation and consulted pharmacy to start IV heparin.  Heparin level 0.31, just therapeutic with heparin infusion at 600 units/hour. Hgb 10.6, Plt 185, stable. No signs/symptoms of bleeding reported per RN.   Goal of Therapy:  Heparin level 0.3-0.5 units/ml Monitor platelets by anticoagulation protocol: Yes   Plan:   Increase heparin to 650 units/hr Daily heparin level and CBC Monitor for signs/symptoms of bleeding Follow up transition to DOAC  Stephenie Acres, PharmD PGY1 Pharmacy Resident 05/19/2023 6:19 AM

## 2023-05-20 ENCOUNTER — Inpatient Hospital Stay (HOSPITAL_COMMUNITY)

## 2023-05-20 DIAGNOSIS — Z7189 Other specified counseling: Secondary | ICD-10-CM | POA: Diagnosis not present

## 2023-05-20 DIAGNOSIS — I4821 Permanent atrial fibrillation: Secondary | ICD-10-CM | POA: Diagnosis not present

## 2023-05-20 DIAGNOSIS — I63412 Cerebral infarction due to embolism of left middle cerebral artery: Secondary | ICD-10-CM | POA: Diagnosis not present

## 2023-05-20 DIAGNOSIS — Z515 Encounter for palliative care: Secondary | ICD-10-CM | POA: Diagnosis not present

## 2023-05-20 DIAGNOSIS — K769 Liver disease, unspecified: Secondary | ICD-10-CM

## 2023-05-20 DIAGNOSIS — G9341 Metabolic encephalopathy: Secondary | ICD-10-CM | POA: Diagnosis not present

## 2023-05-20 DIAGNOSIS — N179 Acute kidney failure, unspecified: Secondary | ICD-10-CM | POA: Diagnosis not present

## 2023-05-20 DIAGNOSIS — W19XXXA Unspecified fall, initial encounter: Secondary | ICD-10-CM | POA: Diagnosis not present

## 2023-05-20 DIAGNOSIS — K7460 Unspecified cirrhosis of liver: Secondary | ICD-10-CM

## 2023-05-20 LAB — CBC WITH DIFFERENTIAL/PLATELET
Abs Immature Granulocytes: 0.12 10*3/uL — ABNORMAL HIGH (ref 0.00–0.07)
Basophils Absolute: 0 10*3/uL (ref 0.0–0.1)
Basophils Relative: 0 %
Eosinophils Absolute: 0.1 10*3/uL (ref 0.0–0.5)
Eosinophils Relative: 1 %
HCT: 35.4 % — ABNORMAL LOW (ref 36.0–46.0)
Hemoglobin: 11 g/dL — ABNORMAL LOW (ref 12.0–15.0)
Immature Granulocytes: 1 %
Lymphocytes Relative: 6 %
Lymphs Abs: 0.6 10*3/uL — ABNORMAL LOW (ref 0.7–4.0)
MCH: 30.7 pg (ref 26.0–34.0)
MCHC: 31.1 g/dL (ref 30.0–36.0)
MCV: 98.9 fL (ref 80.0–100.0)
Monocytes Absolute: 1.2 10*3/uL — ABNORMAL HIGH (ref 0.1–1.0)
Monocytes Relative: 12 %
Neutro Abs: 7.7 10*3/uL (ref 1.7–7.7)
Neutrophils Relative %: 80 %
Platelets: 203 10*3/uL (ref 150–400)
RBC: 3.58 MIL/uL — ABNORMAL LOW (ref 3.87–5.11)
RDW: 18 % — ABNORMAL HIGH (ref 11.5–15.5)
WBC: 9.8 10*3/uL (ref 4.0–10.5)
nRBC: 0 % (ref 0.0–0.2)

## 2023-05-20 LAB — GLUCOSE, CAPILLARY
Glucose-Capillary: 149 mg/dL — ABNORMAL HIGH (ref 70–99)
Glucose-Capillary: 155 mg/dL — ABNORMAL HIGH (ref 70–99)
Glucose-Capillary: 165 mg/dL — ABNORMAL HIGH (ref 70–99)
Glucose-Capillary: 142 mg/dL — ABNORMAL HIGH (ref 70–99)
Glucose-Capillary: 158 mg/dL — ABNORMAL HIGH (ref 70–99)
Glucose-Capillary: 111 mg/dL — ABNORMAL HIGH (ref 70–99)

## 2023-05-20 LAB — COMPREHENSIVE METABOLIC PANEL
ALT: 38 U/L (ref 0–44)
AST: 50 U/L — ABNORMAL HIGH (ref 15–41)
Albumin: 2.9 g/dL — ABNORMAL LOW (ref 3.5–5.0)
Alkaline Phosphatase: 140 U/L — ABNORMAL HIGH (ref 38–126)
Anion gap: 16 — ABNORMAL HIGH (ref 5–15)
BUN: 121 mg/dL — ABNORMAL HIGH (ref 8–23)
CO2: 27 mmol/L (ref 22–32)
Calcium: 9.9 mg/dL (ref 8.9–10.3)
Chloride: 108 mmol/L (ref 98–111)
Creatinine, Ser: 4.51 mg/dL — ABNORMAL HIGH (ref 0.44–1.00)
GFR, Estimated: 9 mL/min — ABNORMAL LOW (ref >60)
Glucose, Bld: 156 mg/dL — ABNORMAL HIGH (ref 70–99)
Potassium: 4.8 mmol/L (ref 3.5–5.1)
Sodium: 151 mmol/L — ABNORMAL HIGH (ref 135–145)
Total Bilirubin: 0.7 mg/dL (ref 0.0–1.2)
Total Protein: 6 g/dL — ABNORMAL LOW (ref 6.5–8.1)

## 2023-05-20 LAB — HEPARIN LEVEL (UNFRACTIONATED): Heparin Unfractionated: 0.36 [IU]/mL (ref 0.30–0.70)

## 2023-05-20 LAB — PHOSPHORUS: Phosphorus: 6.5 mg/dL — ABNORMAL HIGH (ref 2.5–4.6)

## 2023-05-20 LAB — MAGNESIUM: Magnesium: 3 mg/dL — ABNORMAL HIGH (ref 1.7–2.4)

## 2023-05-20 MED ORDER — APIXABAN 2.5 MG PO TABS
2.5000 mg | ORAL_TABLET | Freq: Two times a day (BID) | ORAL | Status: DC
  Administered 2023-05-20 – 2023-05-22 (×5): 2.5 mg
  Filled 2023-05-20 (×5): qty 1

## 2023-05-20 NOTE — Progress Notes (Signed)
 [facility] NEPHROLOGY PROGRESS NOTE  Assessment/ Plan: Pt is a 89 y.o. yo female  with AKI on CKD3, oliguric after presenting with ischemic CVA undergoing mechanical thrombectomy.   # AKI on CKD 3 likely due to ATN from contrast exposure and acute illness; urine output increased with high-dose diuretics.   The patient became hypernatremic with elevated BUN level therefore furosemide IV was discontinued. No indication for RRT, nor should it be offered given her comorbidities and age.  I have discussed with the patient's son and daughter-in-law today about palliative care and comfort measures.  They agreed.  Noted palliative care is already following.  # Metabolic acidosis Improved.   #Acute ischemic left MCA CVA status post mechanical thrombectomy 3//25.  # HTN: On cardiac medication including diltiazem and metoprolol.  Monitor BP. #Atrial fib/flutter currently on heparin # Hyponatremia, dc lasix.  Encourage oral hydration, water intake.  Discussed with the family members  Nothing to add further.  Sign off, please call us back with question.  Subjective: Seen and examined the patient.  Remains nonverbal and looks frail and ill.  Unable to obtain review of system.  Her son and daughter-in-law presented with her.  Urine output measures 2.1 L.  Objective Vital signs in last 24 hours: Vitals:   05/20/23 0500 05/20/23 0744 05/20/23 0757 05/20/23 1155  BP:   (!) 144/51 135/78  Pulse:   (!) 43 (!) 106  Resp:   18 18  Temp:   97.8 F (36.6 C) 97.6 F (36.4 C)  TempSrc:   Oral Oral  SpO2:  97% 100% 100%  Weight: 59.7 kg     Height:       Weight change: -2.2 kg  Intake/Output Summary (Last 24 hours) at 05/20/2023 1246 Last data filed at 05/20/2023 0509 Gross per 24 hour  Intake 232.47 ml  Output 1650 ml  Net -1417.53 ml       Labs: RENAL PANEL Recent Labs  Lab 05/16/23 0501 05/17/23 0539 05/17/23 1725 05/18/23 0942 05/18/23 1625 05/19/23 0735  05/20/23 0829  NA 143 147*  --  147*  --  149* 151*  K 3.8 4.1  --  4.4  --  4.3 4.8  CL 114* 116*  --  113*  --  111 108  CO2 14* 18*  --  20*  --  25 27  GLUCOSE 89 135*  --  132*  --  144* 156*  BUN 45* 64*  --  84*  --  101* 121*  CREATININE 3.61* 4.49*  --  4.77*  --  4.80* 4.51*  CALCIUM 8.6* 9.3  --  9.4  --  9.7 9.9  MG 2.0 2.2 2.3 2.5* 2.6* 2.7* 3.0*  PHOS 6.0* 6.6* 6.1* 6.4* 6.7* 6.6* 6.5*  ALBUMIN 3.2* 3.3*  --  3.0*  --  2.9* 2.9*    Liver Function Tests: Recent Labs  Lab 05/18/23 0942 05/19/23 0735 05/20/23 0829  AST 20 22 50*  ALT 35 32 38  ALKPHOS 82 91 140*  BILITOT 0.6 0.7 0.7  PROT 5.7* 6.0* 6.0*  ALBUMIN 3.0* 2.9* 2.9*   No results for input(s): "LIPASE", "AMYLASE" in the last 168 hours. Recent Labs  Lab 05/13/23 1835  AMMONIA 17   CBC: Recent Labs    07/04/22 1006 09/18/22 1254 11/18/22 1558 03/24/23 1315 04/10/23 1752 05/16/23 0501 05/17/23 0539 05/18/23 0942 05/19/23 0735 05/20/23 0829  HGB 12.4 11.9*   < > 12.1   < > 10.3* 10.9* 10.6* 10.6*  11.0*  MCV 101.2* 103.7*   < > 98.8   < > 100.0 96.9 97.6 97.7 98.9  VITAMINB12  --  846  --  616  --   --   --   --   --   --   FOLATE  --  >40.0  --  >40.0  --   --   --   --   --   --   FERRITIN 26.4 22  --  18  --   --   --   --   --   --   TIBC 457.8* 490*  --  580*  --   --   --   --   --   --   IRON 65 73  --  74  --   --   --   --   --   --    < > = values in this interval not displayed.    Cardiac Enzymes: Recent Labs  Lab 05/13/23 1518 05/14/23 0455 05/15/23 1244 05/16/23 0501  CKTOTAL 1,135* 472* 311* 327*   CBG: Recent Labs  Lab 05/19/23 1947 05/19/23 2330 05/20/23 0405 05/20/23 0759 05/20/23 1154  GLUCAP 139* 116* 149* 155* 165*    Iron Studies: No results for input(s): "IRON", "TIBC", "TRANSFERRIN", "FERRITIN" in the last 72 hours. Studies/Results: DG CHEST PORT 1 VIEW Result Date: 05/20/2023 CLINICAL DATA:  Shortness of breath EXAM: PORTABLE CHEST 1 VIEW  COMPARISON:  05/19/2023 FINDINGS: Feeding catheter is noted extending into the stomach. Pacing device is again noted. Cardiac shadow is stable. Lungs are well aerated bilaterally. Persistent left retrocardiac density is noted. No new focal abnormality is seen. IMPRESSION: No change from the previous day. Electronically Signed   By: Alcide Clever M.D.   On: 05/20/2023 10:01   DG CHEST PORT 1 VIEW Result Date: 05/19/2023 CLINICAL DATA:  Shortness of breath EXAM: PORTABLE CHEST 1 VIEW COMPARISON:  05/17/2023 FINDINGS: Dual lead pacemaker remains in place. Soft feeding tube enters the abdomen. Right chest remains largely clear. Persistent and possibly slightly worsened infiltrate/atelectasis in the left lower lobe. Small amount pleural fluid again seen on the left. No acute bone finding. IMPRESSION: Persistent and possibly slightly worsened infiltrate/atelectasis in the left lower lobe. Small amount of pleural fluid on the left. Electronically Signed   By: Paulina Fusi M.D.   On: 05/19/2023 09:12    Medications: Infusions:  feeding supplement (OSMOLITE 1.5 CAL) 1,000 mL (05/20/23 6578)    Scheduled Medications:  apixaban  2.5 mg Per Tube BID   arformoterol  15 mcg Nebulization BID   aspirin  81 mg Per Tube Daily   budesonide (PULMICORT) nebulizer solution  0.25 mg Nebulization BID   Chlorhexidine Gluconate Cloth  6 each Topical Daily   diltiazem  60 mg Per Tube Q8H   feeding supplement (PROSource TF20)  60 mL Per Tube Daily   Gerhardt's butt cream   Topical TID   insulin aspart  0-6 Units Subcutaneous Q4H   levothyroxine  137 mcg Per Tube Q0600   metoprolol tartrate  50 mg Per Tube BID   mouth rinse  15 mL Mouth Rinse 4 times per day   sodium bicarbonate  650 mg Per Tube BID   thiamine  100 mg Per Tube Daily    have reviewed scheduled and prn medications.  Physical Exam: General:NAD, comfortable, ill looking female Heart:RRR, s1s2 nl Lungs:clear b/l, no crackle Abdomen:soft, Non-tender,  non-distended Extremities:No edema Neurology: Aphasia and confused. Ruth Roth  Prasad Terrell Ostrand 05/20/2023,12:46 PM  LOS: 7 days

## 2023-05-20 NOTE — Care Management Important Message (Signed)
 Important Message  Patient Details  Name: Ruth Roth MRN: [id number] Date of Birth: October 01, 1928   Important Message Given:  Yes - Medicare IM     Dorena Bodo 05/20/2023, 3:01 PM

## 2023-05-20 NOTE — Progress Notes (Addendum)
 PHARMACY - ANTICOAGULATION CONSULT NOTE  Pharmacy Consult for heparin>>apixaban Indication: atrial fibrillation  Allergies  Allergen Reactions   Statins Other (See Comments)    Liver damage    Hydroxychloroquine Hives   Haldol [Haloperidol Lactate] Rash   Librax [Chlordiazepoxide-Clidinium] Rash   Plavix [Clopidogrel Bisulfate] Rash   Ramipril Rash    Patient Measurements: Height: 5\' 3"  (160 cm) Weight: 59.7 kg (131 lb 9.8 oz) IBW/kg (Calculated) : 52.4 Heparin Dosing Weight: 63.5kg  Vital Signs: Temp: 97.8 F (36.6 C) (03/11 0757) Temp Source: Oral (03/11 0757) BP: 144/51 (03/11 0757) Pulse Rate: 43 (03/11 0757)  Labs: Recent Labs    05/18/23 0942 05/19/23 0735 05/20/23 0829  HGB 10.6* 10.6* 11.0*  HCT 33.2* 33.7* 35.4*  PLT 169 185 203  HEPARINUNFRC 0.33 0.31 0.36  CREATININE 4.77* 4.80* 4.51*    Estimated Creatinine Clearance: 6.6 mL/min (A) (by C-G formula based on SCr of 4.51 mg/dL (H)).   Assessment: 89 yo F with PMH of HFpEF, nonobstructive coronary disease, permanent atrial fibrillation/flutter on Xarelto PTA (unknown last dose, last fill 02/10/23 for 90DS), CKD 3, and cirrhosis admitted for L MCA stroke. Underwent thrombectomy 3/5 achieving TICI3 revascularization. Stroke team cleared her to be on anticoagulation and consulted pharmacy to start IV heparin.  Heparin level continues to be therapeutic. CBC remains stable. D/w MD about changing to apixaban.   Addendum  Ok to transition to PO apixaban per Dr. Marland Mcalpine.   Goal of Therapy:  Heparin level 0.3-0.5 units/ml Monitor platelets by anticoagulation protocol: Yes   Plan:   Dc heparin Apixaban 2.5mg  BID Rx will follow peripherally  Ulyses Southward, PharmD, BCIDP, AAHIVP, CPP Infectious Disease Pharmacist 05/20/2023 11:17 AM

## 2023-05-20 NOTE — Progress Notes (Signed)
 Physical Therapy Treatment Patient Details Name: Ruth Roth MRN: [id number] DOB: 1928-09-10 Today's Date: 05/20/2023   History of Present Illness 89 year old female admitted 05/14/23 after being found down at home with R sided weakness, incontinent. CTA head and neck showed occluded versus severe stenosis left M1 MCA with a symmetrical diminished distal left MCA opacification. X rays negative for acute fx. Code stroke activated in the ED with increasing dysarthria. S/p IR revascularization. Dx with UTI and sepsis. Chest x-ray evidence of pneumonia. PMH includes HFpEF, pacemaker (July 2023), atrial fibrillation (on Xarelto), Autoimmune hepatitis, Fibrocystic breast disease, Glaucoma, Hypercholesterolemia, Hypertension, Hypothyroidism, Inflammatory arthritis, CAD, Mitral regurgitation, Neuropathy, Osteoarthritis and PUD.    PT Comments  Pt received in bed, alert and mildly restless. Good eye contact with therapist but no interaction, verbalizations, or command follow. Max assist supine <> sit. Min assist to maintain balance EOB. Pt sitting with very flexed posture, resting forearms on thighs. Pt supine at end of session, HOB at 30 degrees. Current POC remains appropriate.     If plan is discharge home, recommend the following: Two people to help with walking and/or transfers;Two people to help with bathing/dressing/bathroom;Help with stairs or ramp for entrance;Assist for transportation;Assistance with cooking/housework   Can travel by private vehicle     No  Equipment Recommendations  Wheelchair (measurements PT);Hospital bed;Other (comment) (hoyer lift)    Recommendations for Other Services       Precautions / Restrictions Precautions Precautions: Fall Recall of Precautions/Restrictions: Impaired Precaution/Restrictions Comments: global aphasia, cortrak, foley     Mobility  Bed Mobility Overal bed mobility: Needs Assistance Bed Mobility: Supine to Sit, Sit to Supine     Supine to  sit: Max assist Sit to supine: Max assist   General bed mobility comments: no initiation. Assist for all aspects of mobility. Able to come to long sitting in bed with mod assist (HOB at 30 degrees).    Transfers                        Ambulation/Gait                   Stairs             Wheelchair Mobility     Tilt Bed    Modified Rankin (Stroke Patients Only)       Balance Overall balance assessment: Needs assistance Sitting-balance support: Feet supported, Bilateral upper extremity supported Sitting balance-Leahy Scale: Poor Sitting balance - Comments: min to CGA to maintain balance EOB. Pt sitting with very flexed posture.                                    Communication Communication Communication: Impaired Factors Affecting Communication: Difficulty expressing self  Cognition Arousal: Alert Behavior During Therapy: Flat affect, Impulsive   PT - Cognitive impairments: Difficult to assess Difficult to assess due to: Impaired communication                     PT - Cognition Comments: Making good eye contact with therapist. No interaction. Not following commands. Following commands: Impaired Following commands impaired:  (not following commands)    Cueing Cueing Techniques: Verbal cues, Gestural cues, Tactile cues, Visual cues  Exercises      General Comments General comments (skin integrity, edema, etc.): VSS on 5L      Pertinent Vitals/Pain Pain  Assessment Pain Assessment: Faces Faces Pain Scale: Hurts a little bit Pain Location: generalized with mobility Pain Descriptors / Indicators: Grimacing, Guarding Pain Intervention(s): Monitored during session, Repositioned    Home Living                          Prior Function            PT Goals (current goals can now be found in the care plan section) Acute Rehab PT Goals Patient Stated Goal: unable to state Progress towards PT goals:  Progressing toward goals    Frequency    Min 2X/week      PT Plan      Co-evaluation              AM-PAC PT "6 Clicks" Mobility   Outcome Measure  Help needed turning from your back to your side while in a flat bed without using bedrails?: A Lot Help needed moving from lying on your back to sitting on the side of a flat bed without using bedrails?: Total Help needed moving to and from a bed to a chair (including a wheelchair)?: Total Help needed standing up from a chair using your arms (e.g., wheelchair or bedside chair)?: Total Help needed to walk in hospital room?: Total Help needed climbing 3-5 steps with a railing? : Total 6 Click Score: 7    End of Session Equipment Utilized During Treatment: Oxygen Activity Tolerance: Patient tolerated treatment well Patient left: in bed;with call bell/phone within reach;with bed alarm set Nurse Communication: Mobility status PT Visit Diagnosis: Other abnormalities of gait and mobility (R26.89);Muscle weakness (generalized) (M62.81);Other symptoms and signs involving the nervous system (R29.898)     Time: 0750-0809 PT Time Calculation (min) (ACUTE ONLY): 19 min  Charges:    $Therapeutic Activity: 8-22 mins PT General Charges $$ ACUTE PT VISIT: 1 Visit                     Ferd Glassing., PT  Office # [phone]    Ilda Foil 05/20/2023, 8:32 AM

## 2023-05-20 NOTE — Progress Notes (Signed)
 Palliative:  HPI: 89 y.o. female  with past medical history of HFpEF, CAD, AF and Aflutter on Xarelto, s/p pacemaker, CKD stage 3a, cirrhosis d/t autoimmune hepatitis admitted on 05/13/2023 with found down at home with stroke and fall.    I met today at Ms. Voland's bedside with son and daughter-in-law, Hedy Jacob and Elita Quick, as well as Ms. Gordan's grandson. Family report that she has been less responsive to them and their voice today. She does awaken and make brief eye contact but then immediately looks away. She does not appear to rest but constantly looking from place to place. She does not follow commands or verbalize anything.   Family and I review goals of care. They do share that their main goal is her comfort. They want symptoms treated. They want her comfortable while also trying to respect her wishes. We did discuss kidney function and although creatinine is slightly improved her BUN and Na++ are worse. We reviewed desire for ongoing CBG/blood work and they would like to avoid unless these are thought to help her. We discussed that blood work will not add to her comfort or QOL but is a tool for [place] to react to treat to try and prolong life. They are okay with stopping these interventions if the medical team agrees these are not adding to her comfort. I also spent time discussing with them the risks vs benefits of Cortrak and how this can cause discomfort. They will take some time to consider Cortrak and if we should remove or continue. They understand this is not helping her to improve but also know that she has made many comments previously about other friends and family at end of life "starving to death." They understand this is not the case but want to try and respect her wishes as well. I provided them with a copy of Hard Choices booklet to help them with decisions. We will speak again tomorrow.   All questions/concerns addressed. Emotional support provided.   Exam: Alert. Makes eye contact but does not  track or follow. Not following commands. Aphasic. Breathing regular, unlabored. Abd soft. Warm to touch.   Plan: - DNR/DNI - Comfort focused care; treat symptoms as needed - Continue Cortrak for now  50 min  Yong Channel, NP Palliative Medicine Team Pager [phone] (Please see [email or web address] for schedule) Team Phone [phone]

## 2023-05-20 NOTE — Progress Notes (Signed)
 Nutrition Follow-up  DOCUMENTATION CODES:   Non-severe (moderate) malnutrition in context of chronic illness  INTERVENTION:   -Continue TF via cortrak:   Osmolite 1.5 @ 40 ml/hr  60 ml Prosource TF20 daily  Tube feeding regimen provides 1520 kcals, 80 grams of protein, and 960 ml of H2O.    -Continue to monitor Mg, K, and Phos ans replete as needed secondary ot refeeding risk -Continue 100 mg thiamine daily x 7 days  NUTRITION DIAGNOSIS:   Moderate Malnutrition related to chronic illness as evidenced by moderate fat depletion, moderate muscle depletion.  Ongoing  GOAL:   Patient will meet greater than or equal to 90% of their needs  Met with TF  MONITOR:   TF tolerance, Weight trends, Labs  REASON FOR ASSESSMENT:   Consult Assessment of nutrition requirement/status, Enteral/tube feeding initiation and management  ASSESSMENT:   Pt admitted for stroke. 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  3/5- extubated 3/7- cortrak placed  3/10- s/p BSE- NPO 3/11- s/p BSE- NPO  Reviewed I/O's: -1.5 L x 24 hours and +2.7 L since admission  UOP: 2.1 L x 24 hours  Per SLP and RN, pt has been refusing oral care and PO trials. Prognosis for recovery of swallowing and communication remain poor.   Per nephrology notes, no indication for RRT and pt is a poor candidate given co-morbidities and age. Palliative care following; family agrees to comfort measures.   Pt remains NPO and currently receiving TF via cortrak for sole source nutrition: Osmolite 1.5 @ 40 ml/hr and 60 ml Prosource TF daily.   Reviewed wt hx; pt has experienced a 6% wt loss over the past week. Suspect this may be related to diuresis. IV furosemide discontinued secondary to elevated BUN levels.   Medications reviewed and include cardizem, sodium bicarbonate,  and thiamine.  Labs reviewed: Na: 151, Phos: 6.5, Mg: 3.0, CBGS: 116-165 (inpatient orders for glycemic control are 0-6 units insulin aspart every 4 hours).    Diet Order:   Diet Order             Diet NPO time specified  Diet effective now                   EDUCATION NEEDS:   Not appropriate for education at this time  Skin:  Skin Assessment: Skin Integrity Issues: Skin Integrity Issues:: Stage II, Other (Comment) Stage II: bilateral buttocks Other: iritant dermitis on groin & crease of thighs  Last BM:  no value recorded  Height:   Ht Readings from Last 1 Encounters:  05/13/23 5\' 3"  (1.6 m)    Weight:   Wt Readings from Last 1 Encounters:  05/20/23 59.7 kg   BMI:  Body mass index is 23.31 kg/m.  Estimated Nutritional Needs:   Kcal:  1400-1600  Protein:  75-90 g  Fluid:  >/= 1.4 L    Levada Schilling, RD, LDN, CDCES Registered Dietitian III Certified Diabetes Care and Education Specialist If unable to reach this RD, please use "RD Inpatient" group chat on secure chat between hours of 8am-4 pm daily

## 2023-05-20 NOTE — Progress Notes (Addendum)
 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  And found to have acute left MCA scattered infarcts likely embolic event given anticoagulation.  She is now status post revascularization of her left M1 occlusion with a TICI 3.  She was intubated for her interventional radiology procedure and revascularization and brought to the ICU.  She was then subsequently extubated transferred to the [facility] service on 05/15/2023.  She continued to have global aphasia and hospitalization has been complicated by worsening AKI so Nephro was consulted.  Initially she was given IV fluids and then this was stopped and she is given diuresis given that she went into volume overload acute on chronic CHF as well as acute respiratory failure with hypoxia requiring higher supplemental oxygen. Given her inability swallow, Cortrak was placed now and Meds are being adjusted. Nephrology recommended high-dose IV Lasix but despite this his renal function continue to worsen but subsequently now starting to improve.  Diuresis is now discontinued due to elevated BUN and HypoNa+.  Palliative care consulted for GOC discussion being had and she is now DNR/DNI extremely poor prognosis.  After further goals of care discussion today we will NOT repeat Labs as family is slowly transitioning their main goal for the patient's comfort.  Currently family still wants the Cortrak in place for now  Assessment and Plan:  Acute left MCA scattered infarcts which is likely embolic to an A-fib event even on anticoagulation status post revascularization TICI 3: She is status post IR left M1  occlusion with a tiki 3 and MRI showed multifocal acute infarcts throughout the left MCA territory.  MRI revealed distal M1 MCA stenosis versus occlusion which is now patent TTE has been done Neuro recommended po Apixaban and now getting through Cortrak. Stroke team Signed off. Maintain neuroprotective measures with goal for euthymia, euglycemia, natremia normoxia and appears CO2 of 35-40 -Palliative care consulted for goals of care discussion given worsening and now patient is DNR/DNI and Will not repeat Labs due to GOC Palliative discussions   A flutter/Atrial Fibrillation with RVR/CAD/permanent pacemaker: -Changed IV Heparin to po Apixaban 2.5 mg BID and continuing Diltiazem 60 mg every 8 hours for rate control and consolidating when able and also recommending Metoprolol Tartrate 50 mg p.o. twice daily.  Cardiology has no further current recommendations and are going to follow peripherally.  She continues have a flutter with a 4-1 AV block and has a permanent pacemaker as well  Acute on Chronic diastolic heart failure with preserved ejection fraction: BNP went from 673.8 -> 1,268.9. IVF discontinued and she is s/p IV Lasix which is also D/C'd. Strict I's and O's and daily weights is +2.748 L since admission.   AKI on CKD Stage 3b, worsening further Metabolic Acidosis Hypernatremia, worsening  -BUN/Cr Trend went from 31/1.19 and peaked at 101/4.80; On 05/20/23 it is 121/4.51; S/p IVF and Diuresis now (Both stopped) -Patient's Metabolic Acidosis shows a CO2 of 27, anion gap of 16, chloride level 108 -Na+ is now trending up: 140 -> 143 -> 147 -> 149 -> 151. Avoid Nephrotoxic Medications, Contrast Dyes, Hypotension and Dehydration to Ensure Adequate Renal Perfusion and will need to Renally Adjust  Meds -Nephrology consulted for further evaluation and given her progressive hypoxia they were continuing her IV Lasix dosing with 160 mg twice Daily however given that her Na+ and BUN were worsening,  Nephro  discontinued IV Lasix. They do not feel any strong indication for RRT and feel that she is a very poor candidate and recommended against dialysis. -Palliative care has been consulted for further goals of care discussion given her worsening renal function and poor prognosis.  They have evaluated the patient and patient is now DNR/DNI. Will not repeat Labs due to GOC Palliative discussions   Rhabdomyolysis: Improved. CK 327 on last check.  Discontinued IVF and IV Lasix. Will not repeat Labs due to GOC Palliative discussions  Normocytic Anemia: Hgb/Hctis now to be stable at 10.6/33.7. CTM for S/Sx of bleeding; no overt bleeding noted but she was FOBT positive on admission. Will not repeat Labs due to GOC Palliative discussions  Thrombocytopenia: Plts Dropped as low as 138 but has now recovered and is 203 today. CTM for S/Sx of Bleeding; Will not repeat Labs due to GOC Palliative discussions  Sepsis Secondary to Enterobacter Cloacae UTI: S/p IV Ceftriaxone course. Leukocytosis has resolved and is 9.8 and urinalysis is improving  Acute Respiratory Failure with Hypoxia: Was vented postoperatively and was weaned and now started on Xopenex and Atrovent.the oxygen requirements worsened given her volume overload and lack of urine output and current O2 settings are: SpO2: 100 %; O2 Flow Rate (L/min): 5 L/min, FiO2 (%): 30 %  -Wearing supplemental oxygen via nasal cannula and was IV Lasix her volume overload but IV Diuresis stopped due to HyperNa+ -Continue breathing treatments for comfort  Hypothyroidism -TSH is elevated at 8.301 but free T4 is within normal limits at 0.65; resumed home Levothyroxine 137 mcg p.o. via the Cortrak  Hypoalbuminemia: Patient's Albumin Trend Was 2.9 today. Will not repeat Labs due to GOC Palliative discussions  Malnutrition of Moderate Degree in the context of Chronic illness Nutrition Status: Nutrition Problem: Moderate Malnutrition Etiology: chronic illness Signs/Symptoms:  moderate fat depletion, moderate muscle depletion Interventions: Tube feeding   DVT prophylaxis: apixaban (ELIQUIS) tablet 2.5 mg Start: 05/20/23 1300 Place and maintain sequential compression device Start: 05/14/23 0151 SCDs Start: 05/13/23 2145 Place TED hose Start: 05/13/23 2145 apixaban (ELIQUIS) tablet 2.5 mg    Code Status: Limited: Do not attempt resuscitation (DNR) -DNR-LIMITED -Do Not Intubate/DNI  Family Communication: Discussed with the patient's son  Disposition Plan:  Level of care: Progressive Status is: Inpatient Remains inpatient appropriate because: Continues to have further goals of care discussion and now family is leaning towards more of a comfort approach  Consultants:  Neurology Interventional neuroradiology Nephrology Cardiology PCCM transfer Palliative care medicine  Procedures:  As delineated above  Antimicrobials:  Anti-infectives (From admission, onward)    Start     Dose/Rate Route Frequency Ordered Stop   05/14/23 0800  cefTRIAXone (ROCEPHIN) 1 g in sodium chloride 0.9 % 100 mL IVPB        1 g 200 mL/hr over 30 Minutes Intravenous Every 24 hours 05/13/23 2138 05/15/23 0826   05/13/23 2000  cefTRIAXone (ROCEPHIN) 1 g in sodium chloride 0.9 % 100 mL IVPB        1 g 200 mL/hr over 30 Minutes Intravenous  Once 05/13/23 1950 05/13/23 2055       Subjective: Seen and examined at bedside and remains nonverbal and does not really follow commands.  SLP working with her and she is refusing to drink.  Unable  to really verbalize given her global aphasia.  Family is now leaning towards more comfort approach will still want to keep the Cortrak in place.  Objective: Vitals:   05/20/23 0757 05/20/23 1155 05/20/23 1533 05/20/23 1944  BP: (!) 144/51 135/78 (!) 117/47 117/70  Pulse: (!) 43 (!) 106 63 (!) 126  Resp: 18 18 18 18   Temp: 97.8 F (36.6 C) 97.6 F (36.4 C) 98 F (36.7 C) 98.9 F (37.2 C)  TempSrc: Oral Oral Oral Oral  SpO2: 100% 100% 100%  100%  Weight:      Height:        Intake/Output Summary (Last 24 hours) at 05/20/2023 2052 Last data filed at 05/20/2023 2841 Gross per 24 hour  Intake --  Output 800 ml  Net -800 ml   Filed Weights   05/18/23 0500 05/19/23 0500 05/20/23 0500  Weight: 64 kg 61.9 kg 59.7 kg   Examination: Physical Exam:  Constitutional: Elderly Caucasian female who is chronically ill-appearing and globally aphasic and appears a little agitated Respiratory: Diminished to auscultation bilaterally with some coarse breath sounds and some some rhonchi and crackles.  No appreciable wheezing or rales.  Wearing supplemental oxygen via nasal cannula at 5 L Cardiovascular: Irregularly irregular, no murmurs / rubs / gallops. S1 and S2 auscultated.  Slight lower extremity edema Abdomen: Soft, non-tender, non-distended.  Bowel sounds positive.  GU: Deferred. Musculoskeletal: No clubbing / cyanosis of digits/nails. No joint deformity upper and lower extremities.  Neurologic: Remains globally aphasic and has a right facial droop noted.  Does not really follow commands at all today and appears little agitated Psychiatric: Appeared uncomfortable and unable to speak alert agitated  Data Reviewed: I have personally reviewed following labs and imaging studies  CBC: Recent Labs  Lab 05/16/23 0501 05/17/23 0539 05/18/23 0942 05/19/23 0735 05/20/23 0829  WBC 8.8 9.4 7.9 8.1 9.8  NEUTROABS 7.4 8.0* 6.6 6.3 7.7  HGB 10.3* 10.9* 10.6* 10.6* 11.0*  HCT 33.2* 34.2* 33.2* 33.7* 35.4*  MCV 100.0 96.9 97.6 97.7 98.9  PLT 138* 164 169 185 203   Basic Metabolic Panel: Recent Labs  Lab 05/16/23 0501 05/17/23 0539 05/17/23 1725 05/18/23 0942 05/18/23 1625 05/19/23 0735 05/20/23 0829  NA 143 147*  --  147*  --  149* 151*  K 3.8 4.1  --  4.4  --  4.3 4.8  CL 114* 116*  --  113*  --  111 108  CO2 14* 18*  --  20*  --  25 27  GLUCOSE 89 135*  --  132*  --  144* 156*  BUN 45* 64*  --  84*  --  101* 121*  CREATININE  3.61* 4.49*  --  4.77*  --  4.80* 4.51*  CALCIUM 8.6* 9.3  --  9.4  --  9.7 9.9  MG 2.0 2.2 2.3 2.5* 2.6* 2.7* 3.0*  PHOS 6.0* 6.6* 6.1* 6.4* 6.7* 6.6* 6.5*   GFR: Estimated Creatinine Clearance: 6.6 mL/min (A) (by C-G formula based on SCr of 4.51 mg/dL (H)). Liver Function Tests: Recent Labs  Lab 05/16/23 0501 05/17/23 0539 05/18/23 0942 05/19/23 0735 05/20/23 0829  AST 28 28 20 22  50*  ALT 37 39 35 32 38  ALKPHOS 73 82 82 91 140*  BILITOT 0.6 0.8 0.6 0.7 0.7  PROT 5.4* 6.2* 5.7* 6.0* 6.0*  ALBUMIN 3.2* 3.3* 3.0* 2.9* 2.9*   No results for input(s): "LIPASE", "AMYLASE" in the last 168 hours. No results for input(s): "AMMONIA" in  the last 168 hours. Coagulation Profile: Recent Labs  Lab 05/14/23 0455  INR 1.2   Cardiac Enzymes: Recent Labs  Lab 05/14/23 0455 05/15/23 1244 05/16/23 0501  CKTOTAL 472* 311* 327*   BNP (last 3 results) No results for input(s): "PROBNP" in the last 8760 hours. HbA1C: No results for input(s): "HGBA1C" in the last 72 hours. CBG: Recent Labs  Lab 05/20/23 0405 05/20/23 0759 05/20/23 1154 05/20/23 1534 05/20/23 1941  GLUCAP 149* 155* 165* 142* 158*   Lipid Profile: No results for input(s): "CHOL", "HDL", "LDLCALC", "TRIG", "CHOLHDL", "LDLDIRECT" in the last 72 hours. Thyroid Function Tests: No results for input(s): "TSH", "T4TOTAL", "FREET4", "T3FREE", "THYROIDAB" in the last 72 hours. Anemia Panel: No results for input(s): "VITAMINB12", "FOLATE", "FERRITIN", "TIBC", "IRON", "RETICCTPCT" in the last 72 hours. Sepsis Labs: Recent Labs  Lab 05/14/23 0455 05/15/23 1015 05/15/23 1244  LATICACIDVEN 2.2* 1.4 1.6    Recent Results (from the past 240 hours)  MRSA Next Gen by PCR, Nasal     Status: Abnormal   Collection Time: 05/14/23 12:59 AM   Specimen: Nasal Mucosa; Nasal Swab  Result Value Ref Range Status   MRSA by PCR Next Gen DETECTED (A) NOT DETECTED Final    Comment: RESULT CALLED TO, READ BACK BY AND VERIFIED WITH: K  JONES,RN@0210  05/14/23 MK (NOTE) The GeneXpert MRSA Assay (FDA approved for NASAL specimens only), is one component of a comprehensive MRSA colonization surveillance program. It is not intended to diagnose MRSA infection nor to guide or monitor treatment for MRSA infections. Test performance is not FDA approved in patients less than 60 years old. Performed at [facility], [address], [place], [place] [address]   Culture, blood (Routine X 2) w Reflex to ID Panel     Status: None   Collection Time: 05/14/23  4:42 AM   Specimen: BLOOD  Result Value Ref Range Status   Specimen Description BLOOD SITE NOT SPECIFIED  Final   Special Requests   Final    AEROBIC BOTTLE ONLY Blood Culture results may not be optimal due to an inadequate volume of blood received in culture bottles   Culture   Final    NO GROWTH 5 DAYS Performed at [facility], [address] [place], [place] [address]    Report Status 05/19/2023 FINAL  Final  Culture, blood (Routine X 2) w Reflex to ID Panel     Status: None   Collection Time: 05/14/23  4:55 AM   Specimen: BLOOD RIGHT HAND  Result Value Ref Range Status   Specimen Description BLOOD RIGHT HAND  Final   Special Requests   Final    BOTTLES DRAWN AEROBIC AND ANAEROBIC Blood Culture adequate volume   Culture   Final    NO GROWTH 5 DAYS Performed at [facility], [address], [place], [place] [address]    Report Status 05/19/2023 FINAL  Final  Urine Culture (for pregnant, neutropenic or urologic patients or patients with an indwelling urinary catheter)     Status: Abnormal   Collection Time: 05/14/23  6:22 AM   Specimen: Urine, Clean Catch  Result Value Ref Range Status   Specimen Description URINE, CLEAN CATCH  Final   Special Requests   Final    NONE Performed at [facility], [address], [place], [place] [address]    Culture 10,000 COLONIES/mL ENTEROBACTER CLOACAE (A)  Final   Report Status 05/16/2023 FINAL  Final    Organism  ID, Bacteria ENTEROBACTER CLOACAE (A)  Final      Susceptibility   Enterobacter cloacae - MIC*    CEFEPIME <=0.12 SENSITIVE Sensitive     CIPROFLOXACIN <=0.25 SENSITIVE Sensitive     GENTAMICIN <=1 SENSITIVE Sensitive     IMIPENEM 0.5 SENSITIVE Sensitive     NITROFURANTOIN 64 INTERMEDIATE Intermediate     TRIMETH/SULFA <=20 SENSITIVE Sensitive     PIP/TAZO <=4 SENSITIVE Sensitive ug/mL    * 10,000 COLONIES/mL ENTEROBACTER CLOACAE  Resp panel by RT-PCR (RSV, Flu A&B, Covid) Anterior Nasal Swab     Status: None   Collection Time: 05/14/23  6:22 AM   Specimen: Anterior Nasal Swab  Result Value Ref Range Status   SARS Coronavirus 2 by RT PCR NEGATIVE NEGATIVE Final   Influenza A by PCR NEGATIVE NEGATIVE Final   Influenza B by PCR NEGATIVE NEGATIVE Final    Comment: (NOTE) The Xpert Xpress SARS-CoV-2/FLU/RSV plus assay is intended as an aid in the diagnosis of influenza from Nasopharyngeal swab specimens and should not be used as a sole basis for treatment. Nasal washings and aspirates are unacceptable for Xpert Xpress SARS-CoV-2/FLU/RSV testing.  Fact Sheet for Patients: BloggerCourse.com  Fact Sheet for Healthcare Providers: SeriousBroker.it  This test is not yet approved or cleared by the [place] FDA and has been authorized for detection and/or diagnosis of SARS-CoV-2 by FDA under an Emergency Use Authorization (EUA). This EUA will remain in effect (meaning this test can be used) for the duration of the COVID-19 declaration under Section 564(b)(1) of the Act, 21 U.S.C. section 360bbb-3(b)(1), unless the authorization is terminated or revoked.     Resp Syncytial Virus by PCR NEGATIVE NEGATIVE Final    Comment: (NOTE) Fact Sheet for Patients: BloggerCourse.com  Fact Sheet for Healthcare Providers: SeriousBroker.it  This test is not yet approved or cleared by the  [place] FDA and has been authorized for detection and/or diagnosis of SARS-CoV-2 by FDA under an Emergency Use Authorization (EUA). This EUA will remain in effect (meaning this test can be used) for the duration of the COVID-19 declaration under Section 564(b)(1) of the Act, 21 U.S.C. section 360bbb-3(b)(1), unless the authorization is terminated or revoked.  Performed at [facility], [address], [place], [place] [address]     Radiology Studies: DG CHEST PORT 1 VIEW Result Date: 05/20/2023 CLINICAL DATA:  Shortness of breath EXAM: PORTABLE CHEST 1 VIEW COMPARISON:  05/19/2023 FINDINGS: Feeding catheter is noted extending into the stomach. Pacing device is again noted. Cardiac shadow is stable. Lungs are well aerated bilaterally. Persistent left retrocardiac density is noted. No new focal abnormality is seen. IMPRESSION: No change from the previous day. Electronically Signed   By: Alcide Clever M.D.   On: 05/20/2023 10:01   DG CHEST PORT 1 VIEW Result Date: 05/19/2023 CLINICAL DATA:  Shortness of breath EXAM: PORTABLE CHEST 1 VIEW COMPARISON:  05/17/2023 FINDINGS: Dual lead pacemaker remains in place. Soft feeding tube enters the abdomen. Right chest remains largely clear. Persistent and possibly slightly worsened infiltrate/atelectasis in the left lower lobe. Small amount pleural fluid again seen on the left. No acute bone finding. IMPRESSION: Persistent and possibly slightly worsened infiltrate/atelectasis in the left lower lobe. Small amount of pleural fluid on the left. Electronically Signed   By: Paulina Fusi M.D.   On: 05/19/2023 09:12   Scheduled Meds:  apixaban  2.5 mg Per Tube BID   arformoterol  15 mcg Nebulization BID   aspirin  81 mg  Per Tube Daily   budesonide (PULMICORT) nebulizer solution  0.25 mg Nebulization BID   Chlorhexidine Gluconate Cloth  6 each Topical Daily   diltiazem  60 mg Per Tube Q8H   feeding supplement (PROSource TF20)  60 mL Per Tube Daily    Gerhardt's butt cream   Topical TID   insulin aspart  0-6 Units Subcutaneous Q4H   levothyroxine  137 mcg Per Tube Q0600   metoprolol tartrate  50 mg Per Tube BID   mouth rinse  15 mL Mouth Rinse 4 times per day   sodium bicarbonate  650 mg Per Tube BID   thiamine  100 mg Per Tube Daily   Continuous Infusions:  feeding supplement (OSMOLITE 1.5 CAL) 1,000 mL (05/20/23 0347)    LOS: 7 days   Marguerita Merles, DO [facility] Available via Epic secure chat 7am-7pm After these hours, please refer to coverage provider listed on [email or web address] 05/20/2023, 8:52 PM

## 2023-05-20 NOTE — Progress Notes (Signed)
 Speech Language Pathology Treatment: Dysphagia  Patient Details Name: Ruth Roth MRN: [id number] DOB: Jun 16, 1928 Today's Date: 05/20/2023 Time: 6295-2841 SLP Time Calculation (min) (ACUTE ONLY): 14 min  Assessment / Plan / Recommendation Clinical Impression  Pt was seen for dysphagia and aphasia tx. Her son and dtr in law were at the bedside.  Unfortunately, Ruth Roth continues to turn away and not allow oral care.  Poured shasta soda in a cup within her view and put cup in her left hand - she continued to turn head away from approaching cup/straw and would not accepted sips.  Attempted to facilitate voice, command-following, automatic verbal sequences with max assist and multimodal cues - efforts were not effective.   Prognosis for recovery of swallowing and communication remains poor. D/W her son at the bedside.    HPI HPI: Ruth Roth is a 89 y.o. female who presented to the ED from home on 05/14/23 after being found down at home, awake but verbally unresponsive and with right sided weakness. Dx L CVA. S/P thrombectomy 3/5. Intubated 3/5-3/5. MRI: Many acute Infarcts throughout the left MCA territory.  Associated edema without substantial mass effect. No midline shift.  Remote left PCA territory infarct. PMHx of HFpEF, pacemaker, atrial fibrillation (on Xarelto), Autoimmune hepatitis, Glaucoma, Hypercholesterolemia, Hypertension, Hypothyroidism, Inflammatory arthritis, CAD, Mitral regurgitation, Neuropathy, Osteoarthritis and PUD      SLP Plan  Continue with current plan of care      Recommendations for follow up therapy are one component of a multi-disciplinary discharge planning process, led by the attending physician.  Recommendations may be updated based on patient status, additional functional criteria and insurance authorization.    Recommendations  Diet recommendations: NPO                  Oral care prior to ice chip/H20   Frequent or constant  Supervision/Assistance Aphasia (R47.01);Dysphagia, oropharyngeal phase (R13.12)     Continue with current plan of care   Ruth Roth L. Ruth Frederic, MA CCC/SLP Clinical Specialist - Acute Care SLP Acute Rehabilitation Services Office number [phone]   Ruth Roth Ruth Roth  05/20/2023, 11:46 AM

## 2023-05-20 NOTE — Plan of Care (Signed)
  Problem: Nutrition: Goal: Risk of aspiration will decrease Outcome: Progressing   Problem: Nutrition: Goal: Dietary intake will improve Outcome: Progressing   Problem: Clinical Measurements: Goal: Will remain free from infection Outcome: Progressing   Problem: Clinical Measurements: Goal: Diagnostic test results will improve Outcome: Progressing   Problem: Health Behavior/Discharge Planning: Goal: Ability to manage health-related needs will improve Outcome: Not Progressing   Problem: Health Behavior/Discharge Planning: Goal: Goals will be collaboratively established with patient/family Outcome: Not Progressing

## 2023-05-21 DIAGNOSIS — Z515 Encounter for palliative care: Secondary | ICD-10-CM | POA: Diagnosis not present

## 2023-05-21 DIAGNOSIS — I63412 Cerebral infarction due to embolism of left middle cerebral artery: Secondary | ICD-10-CM | POA: Diagnosis not present

## 2023-05-21 DIAGNOSIS — I63422 Cerebral infarction due to embolism of left anterior cerebral artery: Secondary | ICD-10-CM | POA: Diagnosis not present

## 2023-05-21 DIAGNOSIS — Z7189 Other specified counseling: Secondary | ICD-10-CM | POA: Diagnosis not present

## 2023-05-21 DIAGNOSIS — R4182 Altered mental status, unspecified: Secondary | ICD-10-CM

## 2023-05-21 DIAGNOSIS — I4821 Permanent atrial fibrillation: Secondary | ICD-10-CM | POA: Diagnosis not present

## 2023-05-21 DIAGNOSIS — I4892 Unspecified atrial flutter: Secondary | ICD-10-CM | POA: Diagnosis not present

## 2023-05-21 LAB — GLUCOSE, CAPILLARY
Glucose-Capillary: 137 mg/dL — ABNORMAL HIGH (ref 70–99)
Glucose-Capillary: 130 mg/dL — ABNORMAL HIGH (ref 70–99)
Glucose-Capillary: 167 mg/dL — ABNORMAL HIGH (ref 70–99)
Glucose-Capillary: 177 mg/dL — ABNORMAL HIGH (ref 70–99)

## 2023-05-21 MED ORDER — POLYVINYL ALCOHOL 1.4 % OP SOLN: 1.0000 [drp] | Freq: Four times a day (QID) | OPHTHALMIC | Status: DC | PRN

## 2023-05-21 MED ORDER — ONDANSETRON 4 MG PO TBDP: 4.0000 mg | ORAL_TABLET | Freq: Four times a day (QID) | ORAL | Status: DC | PRN

## 2023-05-21 MED ORDER — ONDANSETRON HCL 4 MG/2ML IJ SOLN: 4.0000 mg | Freq: Four times a day (QID) | INTRAMUSCULAR | Status: DC | PRN

## 2023-05-21 MED ORDER — GLYCOPYRROLATE 0.2 MG/ML IJ SOLN: 0.2000 mg | INTRAMUSCULAR | Status: DC | PRN

## 2023-05-21 MED ORDER — FENTANYL CITRATE PF 50 MCG/ML IJ SOSY
12.5000 ug | PREFILLED_SYRINGE | INTRAMUSCULAR | Status: DC | PRN
  Administered 2023-05-21: 12.5 ug via INTRAVENOUS
  Filled 2023-05-21: qty 1

## 2023-05-21 MED ORDER — GLYCOPYRROLATE 1 MG PO TABS: 1.0000 mg | ORAL_TABLET | ORAL | Status: DC | PRN

## 2023-05-21 MED ORDER — BIOTENE DRY MOUTH MT LIQD: 15.0000 mL | OROMUCOSAL | Status: DC | PRN

## 2023-05-21 NOTE — Progress Notes (Signed)
 Palliative:  HPI: 89 y.o. female  with past medical history of HFpEF, CAD, AF and Aflutter on Xarelto, s/p pacemaker, CKD stage 3a, cirrhosis d/t autoimmune hepatitis admitted on 05/13/2023 with found down at home with stroke and fall.    I met today at Ms. Kinney's bedside along with son, daughter-in-law, and grandson. Nursing is working with Ms. Muzyka as she bit off mouth care swab but they were able to extract from mouth. While trying to remove she was trying to keep the nurses away from her mouth and pulled her Cortrak out a little - no completely.   I spoke with family. They understand that Cortrak may need to be advanced. After discussion if Cortrak comes out (now or in the future) they would NOT want this replaced. They want her to have pain medication prior to manipulation of Cortrak to assist with any discomfort. We did discuss routes of symptom management with IV or SL medications for comfort if Cortrak is removed. Family are struggling as they know that Ms. Dubberly has voiced concern many times over the years about friends and family who "starved to death at hospice." She did not agree with hospice philosophy. Although family have good understanding of risks vs benefits and they do want her comfortable (this is the priority) they are struggling with the decision of what to do with Cortrak and artificial feeding. They will continue to discuss and consider path forward. At this time goals are for comfort, no further labs or CBGs (unless thought to add to her comfort), but to continue tube feeding and Cortrak for now. I gave them Hard Choices booklet yesterday.   All questions/concerns addressed. Emotional support provided.   Exam:  - DNR/DNI - Comfort care but continue TF and Cortrak - Ongoing GOC discussions  45 min  Yong Channel, NP Palliative Medicine Team Pager [phone] (Please see [email or web address] for schedule) Team Phone [phone]

## 2023-05-21 NOTE — Progress Notes (Addendum)
 This chaplain responded to PMT NP-Alicia consult for Pt. prayer. The chaplain began rapport building with the Pt. daughter in law-Ruth Roth and grandson-Ruth Roth outside the Pt. room.   The chaplain understands the Pt. is participating in Pt. care at the time of the visit. This chaplain accepted the family's invitation for intercessory prayer and a possible revisit.  **1321 This chaplain is present with the Pt. and grandson-Ruth Roth. Pt. son-Ruth Roth and daughter in law-Ruth Roth have stepped out of the room. The Pt. is resting comfortably.   The chaplain listened reflectively as Ruth Roth connected the chaplain with his understanding of the Pt. faith. The chaplain offered F/U spiritual care as needed.  Chaplain Ruth Roth [phone]

## 2023-05-21 NOTE — Progress Notes (Signed)
 [facility]                                                                              Ruth Roth, is a 89 y.o. 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  She missed her doctor's appointment on 3/4 and wellness check was performed, patient was found lying on the floor with multiple bruises and was transferred to ED.  In ED, noted to have rapid atrial fibrillation with RVR, UTI, elevation in CK.  Trauma workup was negative, cardiology was consulted, patient was placed on diltiazem infusion, IV amiodarone, beta-blocker, IV heparin.  Neurology was also consulted due to concern for stroke provoking the fall. CTA head and neck showed occluded versus a severely stenotic distal left M1 MCA.  Patient underwent mechanical thrombectomy and ATICI 3 revascularization result was achieved. Post procedurally she was transferred to the neuro ICU and PCCM was consulted given that she remained on the ventilator.   She was then subsequently extubated transferred to [facility] service on 05/15/2023.  She continues to have global aphasia, AKI, nephrology following.  Cortrak is place now, palliative medicine consulted for GOC.    Assessment & Plan    Principal Problem: Acute left MCA scattered infarcts which is likely embolic to an A-fib event even on anticoagulation status post revascularization TICI 3 -MRI showed multifocal acute infarcts throughout the left MCA territory.  MRI revealed distal M1 MCA stenosis versus occlusion. - status post IR left M1 occlusion with TICI3, distal M1 MCA stenosis versus occlusion is now patent -Neurology was consulted, 2D echo showed  EF of 60 to 65%, no regional WMA -LDL 67, hemoglobin A1c 5.4. -Initially placed on aspirin 300 mg suppository daily, now on Eliquis  -Palliative following for goals of care.    A flutter/Atrial Fibrillation with RVR/CAD/permanent pacemaker:  -Cardiology was consulted, initially was placed on IV amiodarone, diltiazem, Lopressor, IV heparin drip -Currently on Cardizem 60 mg every 8 hours, Lopressor 50 mg twice daily, eliquis 2.5 mg twice daily  History of pacemaker -[employer] pacemaker implanted in July 2023 for periods of atrial fibrillation with slow ventricular response, device currently programmed VVI with lower rate limit 60 bpm. Device is MRI compatible. r cardiology -2D echo 05/16/2023 showed pacing leads in RA and RV    Acute on Chronic diastolic heart failure with preserved ejection fraction -BNP 673.8 -> 1,268.9 -2D echo 05/16/2023 showed EF 60 to 65%, no RWMA, mild LV hypertrophy -Continue I's and O's, daily weights and renal function.    AKI on CKD Stage 3b, metabolic acidosis, hypernatremia -Per nephrology, likely due to ATN from contrast exposure, acute illness.  Lasix discontinued. -Creatinine slightly improving today 4.5.  Lab Results  Component Value Date   CREATININE 4.51 (H) 05/20/2023   CREATININE 4.80 (  H) 05/19/2023   CREATININE 4.77 (H) 05/18/2023    -Per nephrology, no indication for RRT, recommended palliative GOC and comfort measures. -Continue sodium bicarb  Hypernatremia -Continues to worsen, palliative following for GOC  Rhabdomyolysis -Improving.   -IVF, Lasix discontinued    Normocytic Anemia: -H&H currently stable    Thrombocytopenia:  -Improved    Sepsis Secondary to Enterobacter Cloacae UTI -Received IV Rocephin   Acute Respiratory Failure with Hypoxia -Was intubated postoperatively.   -Currently O2 sat is 98% on room air  -Continue breathing treatments per eval, I-S   Hypothyroidism -TSH is elevated at 8.301 but free T4 is within  normal limits at 0.65 -Continue levothyroxine  RN Pressure Injury Documentation: Bilateral buttock stage II, POA  Moderate protein calorie malnutrition, hypoalbuminemia Nutrition Problem: Moderate Malnutrition Etiology: chronic illness Signs/Symptoms: moderate fat depletion, moderate muscle depletion Interventions: Tube feeding  Estimated body mass index is 21.67 kg/m as calculated from the following:   Height as of this encounter: 5\' 3"  (1.6 m).   Weight as of this encounter: 55.5 kg.  Code Status: DNR DVT Prophylaxis:  apixaban (ELIQUIS) tablet 2.5 mg Start: 05/20/23 1300 Place and maintain sequential compression device Start: 05/14/23 0151 SCDs Start: 05/13/23 2145 Place TED hose Start: 05/13/23 2145 apixaban (ELIQUIS) tablet 2.5 mg   Level of Care: Level of care: Progressive Family Communication:  Disposition Plan:      Remains inpatient appropriate:      Procedures:    Consultants:     Antimicrobials:   Anti-infectives (From admission, onward)    Start     Dose/Rate Route Frequency Ordered Stop   05/14/23 0800  cefTRIAXone (ROCEPHIN) 1 g in sodium chloride 0.9 % 100 mL IVPB        1 g 200 mL/hr over 30 Minutes Intravenous Every 24 hours 05/13/23 2138 05/15/23 0826   05/13/23 2000  cefTRIAXone (ROCEPHIN) 1 g in sodium chloride 0.9 % 100 mL IVPB        1 g 200 mL/hr over 30 Minutes Intravenous  Once 05/13/23 1950 05/13/23 2055          Medications  apixaban  2.5 mg Per Tube BID   arformoterol  15 mcg Nebulization BID   aspirin  81 mg Per Tube Daily   budesonide (PULMICORT) nebulizer solution  0.25 mg Nebulization BID   Chlorhexidine Gluconate Cloth  6 each Topical Daily   diltiazem  60 mg Per Tube Q8H   feeding supplement (PROSource TF20)  60 mL Per Tube Daily   Gerhardt's butt cream   Topical TID   levothyroxine  137 mcg Per Tube Q0600   metoprolol tartrate  50 mg Per Tube BID   mouth rinse  15 mL Mouth Rinse 4 times per day   sodium bicarbonate   650 mg Per Tube BID   thiamine  100 mg Per Tube Daily      Subjective:   Ruth Roth was seen and examined today.  Nonverbal, looks frail.  Unable to obtain review of system from the patient.   Objective:   Vitals:   05/20/23 2314 05/21/23 0332 05/21/23 0500 05/21/23 0824  BP: (!) 122/45 (!) 143/79  128/65  Pulse: 61 (!) 122  63  Resp: 18 18  18   Temp: (!) 100.6 F (38.1 C) 98.8 F (37.1 C)  98.5 F (36.9 C)  TempSrc: Oral Oral  Oral  SpO2: 100% 97%  98%  Weight:   55.5 kg   Height:  No intake or output data in the 24 hours ending 05/21/23 1104   Wt Readings from Last 3 Encounters:  05/21/23 55.5 kg  04/10/23 63.1 kg  03/26/23 63.5 kg     Exam General: Awake, aphasic, frail and ill-appearing Cardiovascular: S1 S2 auscultated,  RRR Respiratory: Clear to auscultation bilaterally Gastrointestinal: Soft, nontender, nondistended, + bowel sounds Ext: no pedal edema bilaterally Neuro: aphasia, unable to assess Psych: frail and ill-appearing, confused    Data Reviewed:  I have personally reviewed following labs    CBC Lab Results  Component Value Date   WBC 9.8 05/20/2023   RBC 3.58 (L) 05/20/2023   HGB 11.0 (L) 05/20/2023   HCT 35.4 (L) 05/20/2023   MCV 98.9 05/20/2023   MCH 30.7 05/20/2023   PLT 203 05/20/2023   MCHC 31.1 05/20/2023   RDW 18.0 (H) 05/20/2023   LYMPHSABS 0.6 (L) 05/20/2023   MONOABS 1.2 (H) 05/20/2023   EOSABS 0.1 05/20/2023   BASOSABS 0.0 05/20/2023     Last metabolic panel Lab Results  Component Value Date   NA 151 (H) 05/20/2023   K 4.8 05/20/2023   CL 108 05/20/2023   CO2 27 05/20/2023   BUN 121 (H) 05/20/2023   CREATININE 4.51 (H) 05/20/2023   GLUCOSE 156 (H) 05/20/2023   GFRNONAA 9 (L) 05/20/2023   GFRAA 24 (L) 02/10/2020   CALCIUM 9.9 05/20/2023   PHOS 6.5 (H) 05/20/2023   PROT 6.0 (L) 05/20/2023   ALBUMIN 2.9 (L) 05/20/2023   LABGLOB 2.1 02/10/2020   AGRATIO 2.1 02/10/2020   BILITOT 0.7 05/20/2023   ALKPHOS  140 (H) 05/20/2023   AST 50 (H) 05/20/2023   ALT 38 05/20/2023   ANIONGAP 16 (H) 05/20/2023    CBG (last 3)  Recent Labs    05/20/23 1941 05/20/23 2314 05/21/23 0330  GLUCAP 158* 111* 137*      Coagulation Profile: No results for input(s): "INR", "PROTIME" in the last 168 hours.   Radiology Studies: I have personally reviewed the imaging studies  DG CHEST PORT 1 VIEW Result Date: 05/20/2023 CLINICAL DATA:  Shortness of breath EXAM: PORTABLE CHEST 1 VIEW COMPARISON:  05/19/2023 FINDINGS: Feeding catheter is noted extending into the stomach. Pacing device is again noted. Cardiac shadow is stable. Lungs are well aerated bilaterally. Persistent left retrocardiac density is noted. No new focal abnormality is seen. IMPRESSION: No change from the previous day. Electronically Signed   By: Alcide Clever M.D.   On: 05/20/2023 10:01       Ruth Roth M.D. [facility] 05/21/2023, 11:04 AM  Available via Epic secure chat 7am-7pm After 7 pm, please refer to night coverage provider listed on amion.

## 2023-05-21 NOTE — Plan of Care (Signed)
   Problem: Education: Goal: Knowledge of disease or condition will improve Outcome: Progressing

## 2023-05-21 NOTE — Progress Notes (Signed)
 Cortrak Tube Team Note:  Consult received to assess a Cortrak feeding tube after pt pulled on tube. Retraced tube, still remains in good position within the stomach. Marking at nare is 63cm. Updated LDA   No x-ray is required. RN may begin using tube.   If the tube becomes dislodged please keep the tube and contact the Cortrak team at [email or web address] for replacement.  If after hours and replacement cannot be delayed, place a NG tube and confirm placement with an abdominal x-ray.    Greig Castilla, RD, LDN Registered Dietitian II Please reach out via secure chat Weekend on-call pager # available in [facility]

## 2023-05-22 DIAGNOSIS — G9341 Metabolic encephalopathy: Secondary | ICD-10-CM | POA: Diagnosis not present

## 2023-05-22 DIAGNOSIS — R7989 Other specified abnormal findings of blood chemistry: Secondary | ICD-10-CM | POA: Diagnosis not present

## 2023-05-22 DIAGNOSIS — I63422 Cerebral infarction due to embolism of left anterior cerebral artery: Secondary | ICD-10-CM | POA: Diagnosis not present

## 2023-05-22 DIAGNOSIS — R4182 Altered mental status, unspecified: Secondary | ICD-10-CM | POA: Diagnosis not present

## 2023-05-22 LAB — GLUCOSE, CAPILLARY
Glucose-Capillary: 165 mg/dL — ABNORMAL HIGH (ref 70–99)
Glucose-Capillary: 168 mg/dL — ABNORMAL HIGH (ref 70–99)

## 2023-05-23 LAB — GLUCOSE, CAPILLARY: Glucose-Capillary: 159 mg/dL — ABNORMAL HIGH (ref 70–99)

## 2023-06-10 NOTE — Death Summary Note (Signed)
 DEATH SUMMARY   Patient Details  Name: Ruth Roth MRN: [id number] DOB: [birth year] WUJ:[id number], Westley Hummer, MD Admission/Discharge Information   Admit Date:  2023/06/09  Date of Death: Date of Death: 2023/06/18  Time of Death: Time of Death: Jun 21, 1156  Length of Stay: 9   Principle Cause of death: Acute left MCA embolic strokes  Hospital Diagnoses:   Acute left MCA infarcts likely embolic  Atrial flutter/atrial fibrillation with RVR  Acute respiratory failure with hypoxia requiring mechanical ventilation   Acute on chronic diastolic CHF (congestive heart failure) (HCC)   Acute kidney injury superimposed on stage 3b chronic kidney disease (HCC)   Fall at home, initial encounter    Permanent atrial fibrillation s/p pacemaker (HCC)   Elevated troponin likely demand ischemia   Sepsis secondary to UTI (HCC)   Acute metabolic encephalopathy   Rhabdomyolysis   Hypothyroidism   Anemia due to chronic kidney disease   Chronic liver disease and cirrhosis (HCC)   Metabolic acidosis   History of CAD (coronary artery disease)   Mitral valvular insufficiency and aortic valvular insufficiency Bilateral buttock stage II pressure injury   Malnutrition of moderate degree   Hospital Course:  Patient was a 89 year old female with HFpEF, nonobstructive CAD, A-fib on Xarelto, pacemaker, CKD stage IIIa, 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  She missed her doctor's appointment on 2023-06-09 and wellness check was performed, patient was found lying on the floor with multiple bruises and was transferred to ED.  In ED, noted to have rapid atrial fibrillation with RVR, UTI, elevation in CK.  Trauma workup was negative, cardiology was consulted, patient was placed on diltiazem infusion, IV amiodarone, beta-blocker, IV heparin.  Neurology was also consulted due to concern for stroke provoking the fall. CTA head and neck showed occluded versus a severely stenotic distal left M1 MCA.  Patient underwent mechanical  thrombectomy and ATICI 3 revascularization result was achieved. Post procedurally she was transferred to the neuro ICU and PCCM was consulted given that she remained on the ventilator.    She was then subsequently extubated transferred to [facility] service on 05/15/2023.  She continued to have global aphasia with deterioration in her renal function and clinical decline.  Nephrology and palliative medicine was consulted was consulted.  Patient was placed on comfort care status per her family's wishes.   Assessment and Plan:   Acute left MCA scattered infarcts which is likely embolic to an A-fib event even on anticoagulation status post revascularization TICI 3 -MRI showed multifocal acute infarcts throughout the left MCA territory.  MRI revealed distal M1 MCA stenosis versus occlusion.  Neurology was consulted patient underwent stroke workup. -Interventional radiology was consulted, status post IR left M1 occlusion with TICI3, distal M1 MCA stenosis versus occlusion, now patent -2D echo showed EF of 60 to 65%, no regional WMA -LDL 67, hemoglobin A1c 5.4. -Initially placed on aspirin 300 mg suppository daily, subsequently transitioned to Eliquis via cortrack feeding tube. -Palliative medicine was consulted for goals of care per family's wishes, patient was placed on comfort care status.  .    A flutter/Atrial Fibrillation with RVR/CAD/permanent pacemaker:  -Cardiology was consulted, initially was placed on IV amiodarone, diltiazem, Lopressor, IV heparin drip -Subsequently placed on Cardizem 60 mg every 8 hours, Lopressor 50 mg twice daily, eliquis 2.5 mg twice daily via cortrack   History of pacemaker -[employer] pacemaker implanted in July 2023 for periods of atrial fibrillation with slow ventricular response, device currently programmed VVI with lower rate limit 60  bpm.  -2D echo 05/16/2023 showed pacing leads in RA and RV     Acute on Chronic diastolic heart failure with preserved ejection  fraction -BNP 673.8 -> 1,268.9 -2D echo 05/16/2023 showed EF 60 to 65%, no RWMA, mild LV hypertrophy -Initially placed on Lasix, discontinued due to worsening renal function     AKI on CKD Stage 3b, metabolic acidosis, hypernatremia -Per nephrology, likely due to ATN from contrast exposure, acute illness.  Lasix was discontinued. -Creatinine plateaued at 6.6, improved to 4.5 on 3/11 -Placed on sodium bicarb. Per nephrology, no indication for RRT, recommended palliative GOC and comfort measures.    Hypernatremia -Continued to worsen, sodium 151 on 05/20/2023  Rhabdomyolysis -CK 1135 on 05/13/23 improved to 327 with fluids   Normocytic Anemia, thrombocytopenia: -Hemoglobin remained stable    Sepsis Secondary to Enterobacter Cloacae UTI -Received IV Rocephin   Acute Respiratory Failure with Hypoxia -Was intubated postoperatively.      Hypothyroidism -TSH is elevated at 8.301 but free T4 is within normal limits at 0.65 -Patient was placed on levothyroxine   RN Pressure Injury Documentation: Bilateral buttock stage II, POA   Moderate protein calorie malnutrition, hypoalbuminemia Nutrition Problem: Moderate Malnutrition Etiology: chronic illness Signs/Symptoms: moderate fat depletion, moderate muscle depletion Interventions: Tube feeding   Estimated body mass index is 23.78 kg/m as calculated from the following:   Height as of this encounter: 5\' 3"  (1.6 m).   Weight as of this encounter: 60.9 kg.  Patient passed on Jun 13, 2023 at 11:58 AM        Procedures: Intubation, extubation.  05/14/2023 status post IR left M1 occlusion with TICI3  Consultations: Neurology, critical care, IR, palliative medicine, nephrology  The results of significant diagnostics from this hospitalization (including imaging, microbiology, ancillary and laboratory) are listed below for reference.   Significant Diagnostic Studies: DG CHEST PORT 1 VIEW Result Date: 05/20/2023 CLINICAL DATA:  Shortness  of breath EXAM: PORTABLE CHEST 1 VIEW COMPARISON:  05/19/2023 FINDINGS: Feeding catheter is noted extending into the stomach. Pacing device is again noted. Cardiac shadow is stable. Lungs are well aerated bilaterally. Persistent left retrocardiac density is noted. No new focal abnormality is seen. IMPRESSION: No change from the previous day. Electronically Signed   By: Alcide Clever M.D.   On: 05/20/2023 10:01   DG CHEST PORT 1 VIEW Result Date: 05/19/2023 CLINICAL DATA:  Shortness of breath EXAM: PORTABLE CHEST 1 VIEW COMPARISON:  05/17/2023 FINDINGS: Dual lead pacemaker remains in place. Soft feeding tube enters the abdomen. Right chest remains largely clear. Persistent and possibly slightly worsened infiltrate/atelectasis in the left lower lobe. Small amount pleural fluid again seen on the left. No acute bone finding. IMPRESSION: Persistent and possibly slightly worsened infiltrate/atelectasis in the left lower lobe. Small amount of pleural fluid on the left. Electronically Signed   By: Paulina Fusi M.D.   On: 05/19/2023 09:12   DG CHEST PORT 1 VIEW Result Date: 05/17/2023 CLINICAL DATA:  Dyspnea EXAM: PORTABLE CHEST 1 VIEW COMPARISON:  05/17/2023 FINDINGS: Nasoenteric feeding tube extends into the upper abdomen beyond the margin of the examination. Pulmonary insufflation is stable. Mild to moderate bilateral perihilar interstitial pulmonary edema has improved. Small left pleural effusion is stable. No pneumothorax. Cardiac size is within normal limits. Left subclavian dual lead pacemaker is unchanged. IMPRESSION: 1. Improving mild to moderate pulmonary edema. Electronically Signed   By: Helyn Numbers M.D.   On: 05/17/2023 22:11   DG CHEST PORT 1 VIEW Result Date: 05/17/2023 CLINICAL DATA:  [id number] SOB (shortness of breath) [id number] EXAM: PORTABLE CHEST 1 VIEW COMPARISON:  May 16, 2023 FINDINGS: The cardiomediastinal silhouette is unchanged in contour. The enteric tube courses through the chest to the  abdomen beyond the field-of-view. LEFT chest cardiac pacing device. Atherosclerotic calcifications. Favor increased layering RIGHT pleural effusion. Favored small LEFT pleural effusion. No pneumothorax. Bibasilar heterogeneous opacities, favored slightly increased throughout the RIGHT lung. Renal contrast of the LEFT kidney. IMPRESSION: 1. Favor increased layering RIGHT pleural effusion with adjacent atelectasis. 2. Favored small LEFT pleural effusion. 3. Background of interstitial prominence and heterogeneous opacities, possibly reflecting edema versus atypical infection or aspiration. 4. Retained contrast within the LEFT kidney likely reflecting renal impairment. Electronically Signed   By: Meda Klinefelter M.D.   On: 05/17/2023 10:25   DG CHEST PORT 1 VIEW Result Date: 05/16/2023 CLINICAL DATA:  Shortness of breath.  History of stroke EXAM: PORTABLE CHEST 1 VIEW COMPARISON:  X-ray 05/16/2023 earlier FINDINGS: Enteric tube with tip extending beneath the diaphragm. Left upper chest pacemaker. Enlarged cardiopericardial silhouette with calcified aorta. Central vascular congestion. Persistent lung base opacities and effusions, left greater than right. No pneumothorax. Surgical changes along the upper central abdomen. IMPRESSION: No significant oval change when adjusted for technique. Electronically Signed   By: Karen Kays M.D.   On: 05/16/2023 12:42   DG Abd Portable 1V Result Date: 05/16/2023 CLINICAL DATA:  Feeding tube placement EXAM: PORTABLE ABDOMEN - 1 VIEW COMPARISON:  05/14/2023 x-ray FINDINGS: Interval below prior NG tube. New feeding tube with tip crossing to the right of the spine, likely along the extreme distal stomach or very proximal duodenum. Minimal bowel gas in the upper abdomen. Surgical changes. Overlapping cardiac leads. Limited x-ray for tube placement. New left retrocardiac lung opacity. Small pleural effusions. Please see separate chest x-ray IMPRESSION: Limited x-ray for tube  placement has feeding tube crossing to the right of the spine, likely distal stomach or very proximal duodenum Electronically Signed   By: Karen Kays M.D.   On: 05/16/2023 12:41   ECHOCARDIOGRAM COMPLETE Result Date: 05/16/2023    ECHOCARDIOGRAM REPORT   Patient Name:   Ruth Roth [facility] Date of Exam: 05/16/2023 Medical Rec #:  [id number]    Height:       63.0 in Accession #:    [id number]   Weight:       137.1 lb Date of Birth:  10-06-1928    BSA:          1.647 m Patient Age:    89 years     BP:           149/53 mmHg Patient Gender: F            HR:           75 bpm. Exam Location:  Inpatient Procedure: 2D Echo, Cardiac Doppler and Color Doppler (Both Spectral and Color            Flow Doppler were utilized during procedure). Indications:    Atrial Fibrillation I48.91 Elevated Troponin  History:        Patient has prior history of Echocardiogram examinations, most                 recent 04/04/2022. CHF, Stroke; Aortic Valve Disease.  Sonographer:    Webb Laws Referring Phys: [id number] SUBRINA SUNDIL IMPRESSIONS  1. Left ventricular ejection fraction, by estimation, is 60 to 65%. The left ventricle has normal function. The left ventricle has no regional wall motion abnormalities. There  is mild left ventricular hypertrophy. Left ventricular diastolic parameters are indeterminate.  2. Pacing leads seen in RA and RV. Right ventricular systolic function is normal. The right ventricular size is normal.  3. Left atrial size was severely dilated.  4. Right atrial size was severely dilated.  5. The mitral valve is degenerative. Trivial mitral valve regurgitation. No evidence of mitral stenosis. Severe mitral annular calcification.  6. The aortic valve was not well visualized. There is moderate calcification of the aortic valve. There is moderate thickening of the aortic valve. Aortic valve regurgitation is mild. Moderate aortic valve stenosis.  7. The inferior vena cava is normal in size with greater than 50% respiratory  variability, suggesting right atrial pressure of 3 mmHg. FINDINGS  Left Ventricle: Left ventricular ejection fraction, by estimation, is 60 to 65%. The left ventricle has normal function. The left ventricle has no regional wall motion abnormalities. Strain was performed and the global longitudinal strain is indeterminate. The left ventricular internal cavity size was normal in size. There is mild left ventricular hypertrophy. Left ventricular diastolic parameters are indeterminate. Right Ventricle: Pacing leads seen in RA and RV. The right ventricular size is normal. No increase in right ventricular wall thickness. Right ventricular systolic function is normal. Left Atrium: Left atrial size was severely dilated. Right Atrium: Right atrial size was severely dilated. Pericardium: There is no evidence of pericardial effusion. Mitral Valve: The mitral valve is degenerative in appearance. There is moderate thickening of the mitral valve leaflet(s). There is moderate calcification of the mitral valve leaflet(s). Severe mitral annular calcification. Trivial mitral valve regurgitation. No evidence of mitral valve stenosis. Tricuspid Valve: The tricuspid valve is normal in structure. Tricuspid valve regurgitation is mild . No evidence of tricuspid stenosis. Aortic Valve: The aortic valve was not well visualized. There is moderate calcification of the aortic valve. There is moderate thickening of the aortic valve. Aortic valve regurgitation is mild. Aortic regurgitation PHT measures 371 msec. Moderate aortic  stenosis is present. Aortic valve mean gradient measures 22.0 mmHg. Aortic valve peak gradient measures 39.2 mmHg. Aortic valve area, by VTI measures 0.54 cm. Pulmonic Valve: The pulmonic valve was normal in structure. Pulmonic valve regurgitation is trivial. No evidence of pulmonic stenosis. Aorta: The aortic root is normal in size and structure. Venous: The inferior vena cava is normal in size with greater than 50%  respiratory variability, suggesting right atrial pressure of 3 mmHg. IAS/Shunts: No atrial level shunt detected by color flow Doppler. Additional Comments: 3D was performed not requiring image post processing on an independent workstation and was indeterminate. A device lead is visualized.  LEFT VENTRICLE PLAX 2D LVIDd:         3.40 cm   Diastology LVIDs:         2.40 cm   LV e' medial:    4.13 cm/s LV PW:         1.00 cm   LV E/e' medial:  53.0 LV IVS:        1.10 cm   LV e' lateral:   7.07 cm/s LVOT diam:     1.40 cm   LV E/e' lateral: 31.0 LV SV:         34 LV SV Index:   21 LVOT Area:     1.54 cm  RIGHT VENTRICLE            IVC RV Basal diam:  3.70 cm    IVC diam: 2.00 cm RV S prime:  7.40 cm/s TAPSE (M-mode): 2.0 cm LEFT ATRIUM             Index        RIGHT ATRIUM           Index LA diam:        4.60 cm 2.79 cm/m   RA Area:     13.30 cm LA Vol (A2C):   55.3 ml 33.57 ml/m  RA Volume:   29.60 ml  17.97 ml/m LA Vol (A4C):   46.6 ml 28.29 ml/m LA Biplane Vol: 51.6 ml 31.33 ml/m  AORTIC VALVE AV Area (Vmax):    0.50 cm AV Area (Vmean):   0.49 cm AV Area (VTI):     0.54 cm AV Vmax:           313.00 cm/s AV Vmean:          219.000 cm/s AV VTI:            0.627 m AV Peak Grad:      39.2 mmHg AV Mean Grad:      22.0 mmHg LVOT Vmax:         101.00 cm/s LVOT Vmean:        70.000 cm/s LVOT VTI:          0.220 m LVOT/AV VTI ratio: 0.35 AI PHT:            371 msec  AORTA Ao Root diam: 2.40 cm Ao Asc diam:  2.60 cm MITRAL VALVE MV Area (PHT): 4.12 cm     SHUNTS MV Decel Time: 184 msec     Systemic VTI:  0.22 m MV E velocity: 219.00 cm/s  Systemic Diam: 1.40 cm Charlton Haws MD Electronically signed by Charlton Haws MD Signature Date/Time: 05/16/2023/12:00:31 PM    Final    DG Chest Port 1 View Result Date: 05/16/2023 CLINICAL DATA:  [id number].  Shortness of breath. EXAM: PORTABLE CHEST 1 VIEW COMPARISON:  Portable chest yesterday at 10:44 a.m. FINDINGS: 3:40 a.m. Left chest dual lead pacing system and wire  insertions are unchanged. Stable cardiomegaly. There is persistent perihilar vascular congestion and mild interstitial edema with small but increasing pleural effusions. There is increased opacity in the lung bases which could be due to atelectasis or consolidation. The remaining lungs are clear. The mediastinum is stable. Aortic atherosclerosis. No new osseous findings.  Osteopenia.  Thoracic spondylosis. IMPRESSION: 1. Persistent perihilar vascular congestion and mild interstitial edema with small but increasing pleural effusions. 2. Increased opacity in the lung bases which could be due to atelectasis or consolidation. 3. Stable cardiomegaly. Electronically Signed   By: Almira Bar M.D.   On: 05/16/2023 06:43   US RENAL Result Date: 05/15/2023 CLINICAL DATA:  Acute renal in EXAM: RENAL / URINARY TRACT ULTRASOUND COMPLETE COMPARISON:  None Available. FINDINGS: Right Kidney: Renal measurements: 7.5 x 3.9 x 3.6 cm. = volume: 54.9 mL. Echogenicity within normal limits. No mass or hydronephrosis visualized. Large upper pole simple cyst is noted measuring 4.2 cm. Left Kidney: Renal measurements: 8.6 x 4.0 x 4.2 cm. = volume: 75.6 mL. Echogenicity within normal limits. No mass or hydronephrosis visualized. Bladder: Appears normal for degree of bladder distention. Other: None. IMPRESSION: Right simple renal cyst.  No follow-up is recommended. Electronically Signed   By: Alcide Clever M.D.   On: 05/15/2023 23:11   DG CHEST PORT 1 VIEW Result Date: 05/15/2023 CLINICAL DATA:  Wheezing. EXAM: PORTABLE CHEST 1 VIEW COMPARISON:  Radiographs 05/14/2023 and 05/13/2023.  CT  05/13/2023. FINDINGS: 1044 hours. Interval extubation and removal of the enteric tube. Left subclavian pacemaker leads appear unchanged, projecting over the right atrium and right ventricle. The heart size and mediastinal contours are stable. However, there are new diffuse ground-glass opacities bilaterally with septal thickening and small left greater  than right pleural effusions. No evidence of pneumothorax. The bones appear unchanged. Asymmetric glenohumeral degenerative changes on the right. IMPRESSION: New diffuse ground-glass opacities bilaterally with septal thickening and small left greater than right pleural effusions, most consistent with pulmonary edema/congestive heart failure. Electronically Signed   By: Carey Bullocks M.D.   On: 05/15/2023 14:44   IR CT Head Ltd Result Date: 05/15/2023 INDICATION: New onset right-sided hemiplegia and aphasia. 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 EXAM: 1. EMERGENT LARGE VESSEL OCCLUSION THROMBOLYSIS (anterior CIRCULATION) COMPARISON:  CT angiogram of the head and neck of May 13, 2023. MEDICATIONS: No antibiotic was administered within 1 hour of the procedure. ANESTHESIA/SEDATION: General anesthesia. CONTRAST:  Omnipaque 300 approximately 120 mL. FLUOROSCOPY TIME:  Fluoroscopy Time: 30 minutes 0 seconds (7419 mGy). COMPLICATIONS: None immediate. TECHNIQUE: Following a full explanation of the procedure along with the potential associated complications, an informed witnessed consent was obtained. The risks of intracranial hemorrhage of 10%, worsening neurological deficit, ventilator dependency, death and inability to revascularize were all reviewed in detail with the patient's son. The patient was then put under general anesthesia by the Department of Anesthesiology at [facility]. The right groin was prepped and draped in the usual sterile fashion. Thereafter using modified Seldinger technique, transfemoral access into the right common femoral artery was obtained without difficulty. Over an 0.035 inch guidewire an 8 French 25 cm Pinnacle sheath was inserted. Through this, and also over an 0.035 inch guidewire a combination of a support 088 100 cm Zoom catheter and a 125 cm Simmons 2 support catheter was advanced to the aortic arch region and selectively positioned in the  left common carotid artery. Arteriograms were then performed centered extra cranially and intracranially. FINDINGS: Left common carotid arteriogram demonstrates the left external carotid artery and its major branches to be widely patent. The left internal carotid artery at the bulb to the cranial skull base is widely patent. The petrous, the cavernous and the supraclinoid segments demonstrate wide patency. The left anterior cerebral artery opacifies into the capillary and venous phases. The left middle cerebral artery demonstrates patency of the proximal 2/3 of the M1 segment. The distal 1/3 demonstrates occlusion. PROCEDURE: Through the Zoom 088 100 cm support catheter in the cervical petrous junction, a combination of an 071 132 cm Zoom with an 021 150 cm Phenom microcatheter was advanced over an 018 inch standard micro guidewire with a moderate J configuration to the supraclinoid left ICA. The micro guidewire was then advanced to the distal left M1 segment followed by the microcatheter followed by the 071 Zoom aspiration catheter. The Zoom aspiration catheter was then engaged into the occluded distal M1 segment. Advancement of the microcatheter through the occluded distal M1 segment into the inferior over the superior divisions was met with significant resistance. The microcatheter and micro guidewire were removed. Constant aspiration was then applied at the hub of the Zoom aspiration catheter for approximately 2 minutes. Thereafter, with proximal aspiration, the combination of the Zoom aspiration catheter was retrieved and removed. A control arteriogram performed through the 088 Zoom support catheter demonstrated improved recanalization of the distal M1 with a few minor opacifying distal to this.  A second pass was then made using an 062 134 cm Penumbra aspiration catheter with an 043 distal aspiration over an 018 inch micro guidewire. The microcatheter was again advanced to the mildly recanalized distal M1  segment followed by the microcatheter and the 062 Zoom aspiration catheter which was advanced into the occluded left M1 segment. Advancement of the micro guidewire through the firm clot was met with resistance. The microcatheter and micro guidewire were removed. Constant aspiration was then applied at the hub of the 062 Penumbra aspiration catheter for 2 minutes. A control arteriogram performed through the Zoom support catheter guidewire now demonstrated partial revascularization of the inferior division. A third pass was then made using the 062 Penumbra aspiration catheter with an 021 150 cm Phenom microcatheter which was advanced over an 018 inch micro guidewire with a moderate J configuration to the distal M1 segment. The micro guidewire was then gently advanced without difficulty through the inferior division of the M2 M3 region followed by the microcatheter. The micro guidewire was removed. Good aspiration obtained from the hub of the microcatheter. A gentle control arteriogram performed through this demonstrates safe positioning of the tip of the microcatheter. A 4 mm x 40 mm Solitaire X retrieval device was then advanced to the distal end of the microcatheter and deployed in the usual manner such that the proximal portion covered the distal M1 segment. The 062 aspiration catheter was now engaged into the origin of the inferior division. A constant aspiration was applied at the hub of the 062 aspiration catheter for approximately 2 minutes. Thereafter the combination of the retrieval device and the aspiration catheter was retrieved and removed. A few more chunks of clot were noted in the aspirate. A control arteriogram performed through the support catheter in the left internal carotid artery now demonstrated revascularization of the left MCA branches achieving a TICI 3 revascularization. Also noted at this time was a small filling defect just distal to the origin of the middle branch of the MCA bifurcation.  This also demonstrated moderate spasm at its origin which responded to 4 aliquots of 25 mcg of nitroglycerin intra-arterially. The small filling defect in the proximal aspect of the superior division was then cleared with another pass with the 062 Penumbra aspiration catheter advanced in combination with a 150 cm Phenom micro catheter over an 018 inch micro guidewire. Aspiration was applied right at the face of the filling defect for 30 seconds. A control arteriogram performed following removal of the aspiration catheters demonstrated clearance of the filling defect with mild residual vasospasm which again responded to 25 mcg of nitroglycerin intra-arterially. A TICI 3 revascularization was achieved. Patency was maintained of the left anterior cerebral artery distribution. A final control arteriogram performed through the Zoom support catheter in the distal left common carotid artery demonstrated patency of the left internal carotid artery extra cranially and intracranially. The intracranial vasculature remained widely patent without any filling defects. An 8 French Angio-Seal closure device was deployed for hemostasis at the at the right groin puncture site. Distal pulses remained present in both feet unchanged compared to prior to the procedure. A flat panel CT of the brain demonstrated no evidence of intracranial hemorrhage. The patient was left intubated due to patient's initial neurologic condition. She was then transferred to the neuro ICU for post revascularization care. IMPRESSION: Status post endovascular complete revascularization of occluded distal left middle cerebral artery M1 segment, and the proximal portions of the superior and the inferior divisions with 3 passes  of contact aspiration and 1 pass with a 4 mm x 40 mm Solitaire X retrieval device and contact aspiration achieving a TICI 3 revascularization. PLAN: Follow-up as per referring MD. Electronically Signed   By: Julieanne Cotton M.D.   On:  05/15/2023 08:27   IR PERCUTANEOUS ART THROMBECTOMY/INFUSION INTRACRANIAL INC DIAG ANGIO Result Date: 05/15/2023 INDICATION: New onset right-sided hemiplegia and aphasia. 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 EXAM: 1. EMERGENT LARGE VESSEL OCCLUSION THROMBOLYSIS (anterior CIRCULATION) COMPARISON:  CT angiogram of the head and neck of May 13, 2023. MEDICATIONS: No antibiotic was administered within 1 hour of the procedure. ANESTHESIA/SEDATION: General anesthesia. CONTRAST:  Omnipaque 300 approximately 120 mL. FLUOROSCOPY TIME:  Fluoroscopy Time: 30 minutes 0 seconds (7419 mGy). COMPLICATIONS: None immediate. TECHNIQUE: Following a full explanation of the procedure along with the potential associated complications, an informed witnessed consent was obtained. The risks of intracranial hemorrhage of 10%, worsening neurological deficit, ventilator dependency, death and inability to revascularize were all reviewed in detail with the patient's son. The patient was then put under general anesthesia by the Department of Anesthesiology at [facility]. The right groin was prepped and draped in the usual sterile fashion. Thereafter using modified Seldinger technique, transfemoral access into the right common femoral artery was obtained without difficulty. Over an 0.035 inch guidewire an 8 French 25 cm Pinnacle sheath was inserted. Through this, and also over an 0.035 inch guidewire a combination of a support 088 100 cm Zoom catheter and a 125 cm Simmons 2 support catheter was advanced to the aortic arch region and selectively positioned in the left common carotid artery. Arteriograms were then performed centered extra cranially and intracranially. FINDINGS: Left common carotid arteriogram demonstrates the left external carotid artery and its major branches to be widely patent. The left internal carotid artery at the bulb to the cranial skull base is widely patent. The petrous, the  cavernous and the supraclinoid segments demonstrate wide patency. The left anterior cerebral artery opacifies into the capillary and venous phases. The left middle cerebral artery demonstrates patency of the proximal 2/3 of the M1 segment. The distal 1/3 demonstrates occlusion. PROCEDURE: Through the Zoom 088 100 cm support catheter in the cervical petrous junction, a combination of an 071 132 cm Zoom with an 021 150 cm Phenom microcatheter was advanced over an 018 inch standard micro guidewire with a moderate J configuration to the supraclinoid left ICA. The micro guidewire was then advanced to the distal left M1 segment followed by the microcatheter followed by the 071 Zoom aspiration catheter. The Zoom aspiration catheter was then engaged into the occluded distal M1 segment. Advancement of the microcatheter through the occluded distal M1 segment into the inferior over the superior divisions was met with significant resistance. The microcatheter and micro guidewire were removed. Constant aspiration was then applied at the hub of the Zoom aspiration catheter for approximately 2 minutes. Thereafter, with proximal aspiration, the combination of the Zoom aspiration catheter was retrieved and removed. A control arteriogram performed through the 088 Zoom support catheter demonstrated improved recanalization of the distal M1 with a few minor opacifying distal to this. A second pass was then made using an 062 134 cm Penumbra aspiration catheter with an 043 distal aspiration over an 018 inch micro guidewire. The microcatheter was again advanced to the mildly recanalized distal M1 segment followed by the microcatheter and the 062 Zoom aspiration catheter which was advanced into the occluded left M1 segment. Advancement  of the micro guidewire through the firm clot was met with resistance. The microcatheter and micro guidewire were removed. Constant aspiration was then applied at the hub of the 062 Penumbra aspiration  catheter for 2 minutes. A control arteriogram performed through the Zoom support catheter guidewire now demonstrated partial revascularization of the inferior division. A third pass was then made using the 062 Penumbra aspiration catheter with an 021 150 cm Phenom microcatheter which was advanced over an 018 inch micro guidewire with a moderate J configuration to the distal M1 segment. The micro guidewire was then gently advanced without difficulty through the inferior division of the M2 M3 region followed by the microcatheter. The micro guidewire was removed. Good aspiration obtained from the hub of the microcatheter. A gentle control arteriogram performed through this demonstrates safe positioning of the tip of the microcatheter. A 4 mm x 40 mm Solitaire X retrieval device was then advanced to the distal end of the microcatheter and deployed in the usual manner such that the proximal portion covered the distal M1 segment. The 062 aspiration catheter was now engaged into the origin of the inferior division. A constant aspiration was applied at the hub of the 062 aspiration catheter for approximately 2 minutes. Thereafter the combination of the retrieval device and the aspiration catheter was retrieved and removed. A few more chunks of clot were noted in the aspirate. A control arteriogram performed through the support catheter in the left internal carotid artery now demonstrated revascularization of the left MCA branches achieving a TICI 3 revascularization. Also noted at this time was a small filling defect just distal to the origin of the middle branch of the MCA bifurcation. This also demonstrated moderate spasm at its origin which responded to 4 aliquots of 25 mcg of nitroglycerin intra-arterially. The small filling defect in the proximal aspect of the superior division was then cleared with another pass with the 062 Penumbra aspiration catheter advanced in combination with a 150 cm Phenom micro catheter over  an 018 inch micro guidewire. Aspiration was applied right at the face of the filling defect for 30 seconds. A control arteriogram performed following removal of the aspiration catheters demonstrated clearance of the filling defect with mild residual vasospasm which again responded to 25 mcg of nitroglycerin intra-arterially. A TICI 3 revascularization was achieved. Patency was maintained of the left anterior cerebral artery distribution. A final control arteriogram performed through the Zoom support catheter in the distal left common carotid artery demonstrated patency of the left internal carotid artery extra cranially and intracranially. The intracranial vasculature remained widely patent without any filling defects. An 8 French Angio-Seal closure device was deployed for hemostasis at the at the right groin puncture site. Distal pulses remained present in both feet unchanged compared to prior to the procedure. A flat panel CT of the brain demonstrated no evidence of intracranial hemorrhage. The patient was left intubated due to patient's initial neurologic condition. She was then transferred to the neuro ICU for post revascularization care. IMPRESSION: Status post endovascular complete revascularization of occluded distal left middle cerebral artery M1 segment, and the proximal portions of the superior and the inferior divisions with 3 passes of contact aspiration and 1 pass with a 4 mm x 40 mm Solitaire X retrieval device and contact aspiration achieving a TICI 3 revascularization. PLAN: Follow-up as per referring MD. Electronically Signed   By: Julieanne Cotton M.D.   On: 05/15/2023 08:27   MR BRAIN WO CONTRAST Result Date: 05/14/2023 CLINICAL DATA:  Stroke, follow up EXAM: MRI HEAD WITHOUT CONTRAST MRA HEAD WITHOUT CONTRAST TECHNIQUE: Multiplanar, multi-echo pulse sequences of the brain and surrounding structures were acquired without intravenous contrast. Angiographic images of the Circle of Willis were  acquired using MRA technique without intravenous contrast. COMPARISON:  CTA head/neck May 13, 2023. FINDINGS: MRI HEAD FINDINGS Brain: Many acute Infarcts throughout the left MCA territory. Associated edema without substantial mass effect. No midline shift. Remote left PCA territory infarct. Patchy T2/FLAIR hyperintensities the white matter, compatible with chronic microvascular ischemic disease. Remote left cerebellar infarct. No mass lesion. Cerebral atrophy. Vascular: See below. Skull and upper cervical spine: Normal marrow signal. Sinuses/Orbits: Mild paranasal sinus mucosal thickening. No acute orbital findings. Other: No mastoid effusions. MRA HEAD FINDINGS Anterior circulation: Bilateral intracranial ICAs, MCAs, and ACAs are patent without proximal hemodynamically significant stenosis. Previously seen distal left M1 MCA stenosis versus occlusion now appears patent. Posterior circulation: Bilateral intradural vertebral arteries, basilar artery and bilateral posterior cerebral arteries are patent without proximal hemodynamically significant stenosis. IMPRESSION: 1. Multiple acute infarcts throughout the left MCA territory. 2. Previously seen distal left M1 MCA stenosis versus occlusion now appears patent. No large vessel occlusion or proximal hemodynamically significant stenosis. Electronically Signed   By: Feliberto Harts M.D.   On: 05/14/2023 19:31   MR ANGIO HEAD WO CONTRAST Result Date: 05/14/2023 CLINICAL DATA:  Stroke, follow up EXAM: MRI HEAD WITHOUT CONTRAST MRA HEAD WITHOUT CONTRAST TECHNIQUE: Multiplanar, multi-echo pulse sequences of the brain and surrounding structures were acquired without intravenous contrast. Angiographic images of the Circle of Willis were acquired using MRA technique without intravenous contrast. COMPARISON:  CTA head/neck May 13, 2023. FINDINGS: MRI HEAD FINDINGS Brain: Many acute Infarcts throughout the left MCA territory. Associated edema without substantial mass  effect. No midline shift. Remote left PCA territory infarct. Patchy T2/FLAIR hyperintensities the white matter, compatible with chronic microvascular ischemic disease. Remote left cerebellar infarct. No mass lesion. Cerebral atrophy. Vascular: See below. Skull and upper cervical spine: Normal marrow signal. Sinuses/Orbits: Mild paranasal sinus mucosal thickening. No acute orbital findings. Other: No mastoid effusions. MRA HEAD FINDINGS Anterior circulation: Bilateral intracranial ICAs, MCAs, and ACAs are patent without proximal hemodynamically significant stenosis. Previously seen distal left M1 MCA stenosis versus occlusion now appears patent. Posterior circulation: Bilateral intradural vertebral arteries, basilar artery and bilateral posterior cerebral arteries are patent without proximal hemodynamically significant stenosis. IMPRESSION: 1. Multiple acute infarcts throughout the left MCA territory. 2. Previously seen distal left M1 MCA stenosis versus occlusion now appears patent. No large vessel occlusion or proximal hemodynamically significant stenosis. Electronically Signed   By: Feliberto Harts M.D.   On: 05/14/2023 19:31   DG Abd Portable 1V Result Date: 05/14/2023 CLINICAL DATA:  Check gastric catheter placement EXAM: PORTABLE ABDOMEN - 1 VIEW COMPARISON:  None Available. FINDINGS: Gastric catheter is noted within the stomach. Contrast is noted within the kidneys bilaterally. No free air is seen. IMPRESSION: Gastric catheter in the stomach. Electronically Signed   By: Alcide Clever M.D.   On: 05/14/2023 02:54   Portable Chest x-ray Result Date: 05/14/2023 CLINICAL DATA:  Status post intubation EXAM: PORTABLE CHEST 1 VIEW COMPARISON:  Film from the previous day. FINDINGS: Cardiac shadow is within normal limits. Pacing device is again seen. Endotracheal tube and gastric catheter are noted in satisfactory position. Lungs are free of acute infiltrate or effusion. IMPRESSION: No active disease.  Electronically Signed   By: Alcide Clever M.D.   On: 05/14/2023 02:53   CT ANGIO HEAD NECK  W WO CM W PERF Result Date: 05/13/2023 CLINICAL DATA:  Neuro deficit, acute, stroke suspected EXAM: CT ANGIOGRAPHY HEAD AND NECK CT PERFUSION BRAIN TECHNIQUE: Multidetector CT imaging of the head and neck was performed using the standard protocol during bolus administration of intravenous contrast. Multiplanar CT image reconstructions and MIPs were obtained to evaluate the vascular anatomy. Carotid stenosis measurements (when applicable) are obtained utilizing NASCET criteria, using the distal internal carotid diameter as the denominator. Multiphase CT imaging of the brain was performed following IV bolus contrast injection. Subsequent parametric perfusion maps were calculated using RAPID software. RADIATION DOSE REDUCTION: This exam was performed according to the departmental dose-optimization program which includes automated exposure control, adjustment of the mA and/or kV according to patient size and/or use of iterative reconstruction technique. CONTRAST:  OMNIPAQUE IOHEXOL 350 MG/ML SOLN COMPARISON:  Same day CT head. FINDINGS: CTA NECK FINDINGS Aortic arch: Atherosclerosis.  Great vessel origins are patent. Right carotid system: Atherosclerosis at the carotid bifurcation without greater than 50% stenosis. Also, atherosclerosis at the skull base with approximately 60% stenosis. Left carotid system: Atherosclerosis without greater than 50% stenosis. Vertebral arteries: Left dominant. No significant (greater than 50%) stenosis. Skeleton: No acute abnormality on limited assessment. Other neck: No acute abnormality on limited assessment. Upper chest: Visualized lung apices are clear. Review of the MIP images confirms the above findings CTA HEAD FINDINGS Anterior circulation: Bilateral intracranial ICAs are patent without significant stenosis. Right MCA and bilateral ACAs are patent without proximal hemodynamically  significant stenosis. Distal left M1 MCA occlusion versus severe stenosis. Asymmetric opacification of the left MCA vessels more distally. Posterior circulation: Bilateral intradural vertebral arteries, basilar artery and bilateral posterior cerebral arteries are patent without proximal hemodynamically significant stenosis. Venous sinuses: As permitted by contrast timing, patent. Review of the MIP images confirms the above findings CT Brain Perfusion Findings: CBF (<30%) Volume: 0mL Perfusion (Tmax>6.0s) volume: 50mL Mismatch Volume: 50mL Infarction Location:None identified. IMPRESSION: 1. Occluded versus severely stenotic distal left M1 MCA with asymmetrically diminished distal left MCA opacification. Recommend code stroke activation and emergent neurology consultation. 2. Approximately 50 mL of left MCA penumbra without evidence of core infarct by CT perfusion. 3. Approximately 60% stenosis of the right ICA at the skull base. Findings and recommendations discussed with Dr. Wilkie Aye via telephone at Dr. Wilkie Aye. Electronically Signed   By: Feliberto Harts M.D.   On: 05/13/2023 21:40   DG Shoulder Right Portable Result Date: 05/13/2023 CLINICAL DATA:  Status post trauma. EXAM: RIGHT SHOULDER - 1 VIEW COMPARISON:  None Available. FINDINGS: Limited study secondary to numerous overlying radiopaque cardiac lead wires. There is no evidence of acute fracture or dislocation. Chronic changes are seen along the greater tubercle and inferior medial aspect of the right humeral. There are moderate severity degenerative changes. Soft tissues are unremarkable. IMPRESSION: 1. Limited study without evidence of acute fracture or dislocation. 2. Moderate severity chronic and degenerative changes. Electronically Signed   By: Aram Candela M.D.   On: 05/13/2023 17:46   DG Pelvis Portable Result Date: 05/13/2023 CLINICAL DATA:  Status post trauma. EXAM: PORTABLE PELVIS 1-2 VIEWS COMPARISON:  March 05, 2016 FINDINGS: There is  no evidence of pelvic fracture or diastasis. No pelvic bone lesions are seen. IMPRESSION: Negative. Electronically Signed   By: Aram Candela M.D.   On: 05/13/2023 17:45   DG Chest Port 1 View Result Date: 05/13/2023 CLINICAL DATA:  Status post trauma. EXAM: PORTABLE CHEST 1 VIEW COMPARISON:  December 17, 2022 FINDINGS: There is  stable dual lead AICD positioning. The heart size and mediastinal contours are within normal limits. Marked severity calcification of the thoracic aorta is noted. There is no evidence of an acute infiltrate, pleural effusion or pneumothorax. Radiopaque surgical clips are seen overlying the esophageal hiatus. There is mild dextroscoliosis of the mid and lower thoracic spine with multilevel degenerative changes. IMPRESSION: No active cardiopulmonary disease. Electronically Signed   By: Aram Candela M.D.   On: 05/13/2023 17:44   CT CHEST ABDOMEN PELVIS W CONTRAST Result Date: 05/13/2023 CLINICAL DATA:  Found unresponsive, unknown down time EXAM: CT CHEST, ABDOMEN, AND PELVIS WITH CONTRAST CT THORACIC AND LUMBAR SPINE WITH CONTRAST TECHNIQUE: Multidetector CT imaging of the chest, abdomen and pelvis was performed following the standard protocol during bolus administration of intravenous contrast. Multidetector CT imaging of the thoracic and lumbar spine was performed following the standard protocol during bolus administration of intravenous contrast. RADIATION DOSE REDUCTION: This exam was performed according to the departmental dose-optimization program which includes automated exposure control, adjustment of the mA and/or kV according to patient size and/or use of iterative reconstruction technique. CONTRAST:  75mL OMNIPAQUE IOHEXOL 350 MG/ML SOLN COMPARISON:  CT abdomen pelvis, 04/22/2014 FINDINGS: CT CHEST FINDINGS Cardiovascular: Left chest multi lead pacer. Aortic atherosclerosis. Normal heart size. Scattered left and right coronary artery calcifications. No pericardial  effusion. Mediastinum/Nodes: No enlarged mediastinal, hilar, or axillary lymph nodes. Thyroid gland, trachea, and esophagus demonstrate no significant findings. Lungs/Pleura: Lungs are clear. No pleural effusion or pneumothorax. Musculoskeletal: No chest wall mass or suspicious osseous lesions identified. CT ABDOMEN PELVIS FINDINGS Hepatobiliary: No focal liver abnormality is seen. Status post cholecystectomy. Similar severe postoperative biliary ductal dilatation. Pancreas: Unremarkable. No pancreatic ductal dilatation or surrounding inflammatory changes. Spleen: Normal in size without significant abnormality. Adrenals/Urinary Tract: Adrenal glands are unremarkable. Kidneys are normal, without renal calculi, solid lesion, or hydronephrosis. Bladder is unremarkable. Stomach/Bowel: Stomach is within normal limits. Appendix not clearly visualized. No evidence of bowel wall thickening, distention, or inflammatory changes. Vascular/Lymphatic: Aortic atherosclerosis. 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 No enlarged abdominal or pelvic lymph nodes. Reproductive: No mass or other abnormality. Other: No abdominal wall hernia or abnormality. No ascites. Musculoskeletal: No acute osseous findings. CT THORACIC AND LUMBAR SPINE FINDINGS Alignment: Normal thoracic kyphosis. Normal lumbar lordosis. Vertebral bodies: No acute fracture or dislocation. Unchanged mild superior endplate wedge deformity of L2, with approximately 20% height loss. Disc spaces: Generally mild multilevel disc space height loss and osteophytosis throughout the thoracic and lumbar spine, focally moderate at L3-L4. Paraspinous soft tissues: Unremarkable. IMPRESSION: 1. No CT evidence of acute traumatic injury to the chest, abdomen, or pelvis. 2. No acute fracture or dislocation of the thoracic or lumbar spine. Unchanged mild, chronic wedge deformity of L2. 3. Status post  cholecystectomy. Similar severe postoperative biliary ductal dilatation. 4. Coronary artery disease. Aortic Atherosclerosis (ICD10-I70.0). Electronically Signed   By: Jearld Lesch M.D.   On: 05/13/2023 17:28   CT L-SPINE NO CHARGE Result Date: 05/13/2023 CLINICAL DATA:  Found unresponsive, unknown down time EXAM: CT CHEST, ABDOMEN, AND PELVIS WITH CONTRAST CT THORACIC AND LUMBAR SPINE WITH CONTRAST TECHNIQUE: Multidetector CT imaging of the chest, abdomen and pelvis was performed following the standard protocol during bolus administration of intravenous contrast. Multidetector CT imaging of the thoracic and lumbar spine was performed following the standard protocol during bolus administration of intravenous contrast. RADIATION DOSE REDUCTION: This exam was performed according to the departmental  dose-optimization program which includes automated exposure control, adjustment of the mA and/or kV according to patient size and/or use of iterative reconstruction technique. CONTRAST:  75mL OMNIPAQUE IOHEXOL 350 MG/ML SOLN COMPARISON:  CT abdomen pelvis, 04/22/2014 FINDINGS: CT CHEST FINDINGS Cardiovascular: Left chest multi lead pacer. Aortic atherosclerosis. Normal heart size. Scattered left and right coronary artery calcifications. No pericardial effusion. Mediastinum/Nodes: No enlarged mediastinal, hilar, or axillary lymph nodes. Thyroid gland, trachea, and esophagus demonstrate no significant findings. Lungs/Pleura: Lungs are clear. No pleural effusion or pneumothorax. Musculoskeletal: No chest wall mass or suspicious osseous lesions identified. CT ABDOMEN PELVIS FINDINGS Hepatobiliary: No focal liver abnormality is seen. Status post cholecystectomy. Similar severe postoperative biliary ductal dilatation. Pancreas: Unremarkable. No pancreatic ductal dilatation or surrounding inflammatory changes. Spleen: Normal in size without significant abnormality. Adrenals/Urinary Tract: Adrenal glands are unremarkable. Kidneys  are normal, without renal calculi, solid lesion, or hydronephrosis. Bladder is unremarkable. Stomach/Bowel: Stomach is within normal limits. Appendix not clearly visualized. No evidence of bowel wall thickening, distention, or inflammatory changes. Vascular/Lymphatic: Aortic atherosclerosis. 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 No enlarged abdominal or pelvic lymph nodes. Reproductive: No mass or other abnormality. Other: No abdominal wall hernia or abnormality. No ascites. Musculoskeletal: No acute osseous findings. CT THORACIC AND LUMBAR SPINE FINDINGS Alignment: Normal thoracic kyphosis. Normal lumbar lordosis. Vertebral bodies: No acute fracture or dislocation. Unchanged mild superior endplate wedge deformity of L2, with approximately 20% height loss. Disc spaces: Generally mild multilevel disc space height loss and osteophytosis throughout the thoracic and lumbar spine, focally moderate at L3-L4. Paraspinous soft tissues: Unremarkable. IMPRESSION: 1. No CT evidence of acute traumatic injury to the chest, abdomen, or pelvis. 2. No acute fracture or dislocation of the thoracic or lumbar spine. Unchanged mild, chronic wedge deformity of L2. 3. Status post cholecystectomy. Similar severe postoperative biliary ductal dilatation. 4. Coronary artery disease. Aortic Atherosclerosis (ICD10-I70.0). Electronically Signed   By: Jearld Lesch M.D.   On: 05/13/2023 17:28   CT T-SPINE NO CHARGE Result Date: 05/13/2023 CLINICAL DATA:  Found unresponsive, unknown down time EXAM: CT CHEST, ABDOMEN, AND PELVIS WITH CONTRAST CT THORACIC AND LUMBAR SPINE WITH CONTRAST TECHNIQUE: Multidetector CT imaging of the chest, abdomen and pelvis was performed following the standard protocol during bolus administration of intravenous contrast. Multidetector CT imaging of the thoracic and lumbar spine was performed following the standard protocol during bolus  administration of intravenous contrast. RADIATION DOSE REDUCTION: This exam was performed according to the departmental dose-optimization program which includes automated exposure control, adjustment of the mA and/or kV according to patient size and/or use of iterative reconstruction technique. CONTRAST:  75mL OMNIPAQUE IOHEXOL 350 MG/ML SOLN COMPARISON:  CT abdomen pelvis, 04/22/2014 FINDINGS: CT CHEST FINDINGS Cardiovascular: Left chest multi lead pacer. Aortic atherosclerosis. Normal heart size. Scattered left and right coronary artery calcifications. No pericardial effusion. Mediastinum/Nodes: No enlarged mediastinal, hilar, or axillary lymph nodes. Thyroid gland, trachea, and esophagus demonstrate no significant findings. Lungs/Pleura: Lungs are clear. No pleural effusion or pneumothorax. Musculoskeletal: No chest wall mass or suspicious osseous lesions identified. CT ABDOMEN PELVIS FINDINGS Hepatobiliary: No focal liver abnormality is seen. Status post cholecystectomy. Similar severe postoperative biliary ductal dilatation. Pancreas: Unremarkable. No pancreatic ductal dilatation or surrounding inflammatory changes. Spleen: Normal in size without significant abnormality. Adrenals/Urinary Tract: Adrenal glands are unremarkable. Kidneys are normal, without renal calculi, solid lesion, or hydronephrosis. Bladder is unremarkable. Stomach/Bowel: Stomach is within normal limits. Appendix not clearly visualized. No evidence  of bowel wall thickening, distention, or inflammatory changes. Vascular/Lymphatic: Aortic atherosclerosis. 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 No enlarged abdominal or pelvic lymph nodes. Reproductive: No mass or other abnormality. Other: No abdominal wall hernia or abnormality. No ascites. Musculoskeletal: No acute osseous findings. CT THORACIC AND LUMBAR SPINE FINDINGS Alignment: Normal thoracic kyphosis. Normal lumbar  lordosis. Vertebral bodies: No acute fracture or dislocation. Unchanged mild superior endplate wedge deformity of L2, with approximately 20% height loss. Disc spaces: Generally mild multilevel disc space height loss and osteophytosis throughout the thoracic and lumbar spine, focally moderate at L3-L4. Paraspinous soft tissues: Unremarkable. IMPRESSION: 1. No CT evidence of acute traumatic injury to the chest, abdomen, or pelvis. 2. No acute fracture or dislocation of the thoracic or lumbar spine. Unchanged mild, chronic wedge deformity of L2. 3. Status post cholecystectomy. Similar severe postoperative biliary ductal dilatation. 4. Coronary artery disease. Aortic Atherosclerosis (ICD10-I70.0). Electronically Signed   By: Jearld Lesch M.D.   On: 05/13/2023 17:28   CT HEAD WO CONTRAST Result Date: 05/13/2023 CLINICAL DATA:  Found unresponsive, unknown downtime EXAM: CT HEAD WITHOUT CONTRAST CT MAXILLOFACIAL WITHOUT CONTRAST CT CERVICAL SPINE WITHOUT CONTRAST TECHNIQUE: Multidetector CT imaging of the head, cervical spine, and maxillofacial structures were performed using the standard protocol without intravenous contrast. Multiplanar CT image reconstructions of the cervical spine and maxillofacial structures were also generated. RADIATION DOSE REDUCTION: This exam was performed according to the departmental dose-optimization program which includes automated exposure control, adjustment of the mA and/or kV according to patient size and/or use of iterative reconstruction technique. COMPARISON:  04/10/2023 FINDINGS: CT HEAD FINDINGS Brain: No evidence of acute infarction, hemorrhage, hydrocephalus, extra-axial collection or mass lesion/mass effect. Mild periventricular and deep white matter hypodensity. Unchanged left occipital encephalomalacia. Vascular: No hyperdense vessel or unexpected calcification. CT FACIAL BONES FINDINGS Skull: Normal. Negative for fracture or focal lesion. Facial bones: No displaced fractures  or dislocations. Sinuses/Orbits: Small air-fluid level in the right maxillary sinus. No overlying fracture. Other: Patient is partially edentulous with erosion of the left maxillary alveolar bone into the left maxillary sinus (series 7, image 31). CT CERVICAL SPINE FINDINGS Alignment: Normal. Skull base and vertebrae: No acute fracture. No primary bone lesion or focal pathologic process. Soft tissues and spinal canal: No prevertebral fluid or swelling. No visible canal hematoma. Disc levels: Focally moderate disc space height loss and osteophytosis of the lower cervical levels C5-C7 Upper chest: Negative. Other: None. IMPRESSION: 1. No acute intracranial pathology. Small-vessel white matter disease and unchanged left occipital encephalomalacia. 2. No displaced fractures or dislocations of the facial bones. 3. Small air-fluid level in the right maxillary sinus. No overlying fracture. Patient is partially edentulous with erosion of the left maxillary alveolar bone into the left maxillary sinus. Correlate for sinusitis. 4. No fracture or static subluxation of the cervical spine. 5. Focally moderate disc space height loss and osteophytosis of the lower cervical levels. Electronically Signed   By: Jearld Lesch M.D.   On: 05/13/2023 17:20   CT CERVICAL SPINE WO CONTRAST Result Date: 05/13/2023 CLINICAL DATA:  Found unresponsive, unknown downtime EXAM: CT HEAD WITHOUT CONTRAST CT MAXILLOFACIAL WITHOUT CONTRAST CT CERVICAL SPINE WITHOUT CONTRAST TECHNIQUE: Multidetector CT imaging of the head, cervical spine, and maxillofacial structures were performed using the standard protocol without intravenous contrast. Multiplanar CT image reconstructions of the cervical spine and maxillofacial structures were also generated. RADIATION DOSE REDUCTION: This exam was performed according to the departmental dose-optimization  program which includes automated exposure control, adjustment of the mA and/or kV according to patient size  and/or use of iterative reconstruction technique. COMPARISON:  04/10/2023 FINDINGS: CT HEAD FINDINGS Brain: No evidence of acute infarction, hemorrhage, hydrocephalus, extra-axial collection or mass lesion/mass effect. Mild periventricular and deep white matter hypodensity. Unchanged left occipital encephalomalacia. Vascular: No hyperdense vessel or unexpected calcification. CT FACIAL BONES FINDINGS Skull: Normal. Negative for fracture or focal lesion. Facial bones: No displaced fractures or dislocations. Sinuses/Orbits: Small air-fluid level in the right maxillary sinus. No overlying fracture. Other: Patient is partially edentulous with erosion of the left maxillary alveolar bone into the left maxillary sinus (series 7, image 31). CT CERVICAL SPINE FINDINGS Alignment: Normal. Skull base and vertebrae: No acute fracture. No primary bone lesion or focal pathologic process. Soft tissues and spinal canal: No prevertebral fluid or swelling. No visible canal hematoma. Disc levels: Focally moderate disc space height loss and osteophytosis of the lower cervical levels C5-C7 Upper chest: Negative. Other: None. IMPRESSION: 1. No acute intracranial pathology. Small-vessel white matter disease and unchanged left occipital encephalomalacia. 2. No displaced fractures or dislocations of the facial bones. 3. Small air-fluid level in the right maxillary sinus. No overlying fracture. Patient is partially edentulous with erosion of the left maxillary alveolar bone into the left maxillary sinus. Correlate for sinusitis. 4. No fracture or static subluxation of the cervical spine. 5. Focally moderate disc space height loss and osteophytosis of the lower cervical levels. Electronically Signed   By: Jearld Lesch M.D.   On: 05/13/2023 17:20   CT MAXILLOFACIAL WO CONTRAST Result Date: 05/13/2023 CLINICAL DATA:  Found unresponsive, unknown downtime EXAM: CT HEAD WITHOUT CONTRAST CT MAXILLOFACIAL WITHOUT CONTRAST CT CERVICAL SPINE WITHOUT  CONTRAST TECHNIQUE: Multidetector CT imaging of the head, cervical spine, and maxillofacial structures were performed using the standard protocol without intravenous contrast. Multiplanar CT image reconstructions of the cervical spine and maxillofacial structures were also generated. RADIATION DOSE REDUCTION: This exam was performed according to the departmental dose-optimization program which includes automated exposure control, adjustment of the mA and/or kV according to patient size and/or use of iterative reconstruction technique. COMPARISON:  04/10/2023 FINDINGS: CT HEAD FINDINGS Brain: No evidence of acute infarction, hemorrhage, hydrocephalus, extra-axial collection or mass lesion/mass effect. Mild periventricular and deep white matter hypodensity. Unchanged left occipital encephalomalacia. Vascular: No hyperdense vessel or unexpected calcification. CT FACIAL BONES FINDINGS Skull: Normal. Negative for fracture or focal lesion. Facial bones: No displaced fractures or dislocations. Sinuses/Orbits: Small air-fluid level in the right maxillary sinus. No overlying fracture. Other: Patient is partially edentulous with erosion of the left maxillary alveolar bone into the left maxillary sinus (series 7, image 31). CT CERVICAL SPINE FINDINGS Alignment: Normal. Skull base and vertebrae: No acute fracture. No primary bone lesion or focal pathologic process. Soft tissues and spinal canal: No prevertebral fluid or swelling. No visible canal hematoma. Disc levels: Focally moderate disc space height loss and osteophytosis of the lower cervical levels C5-C7 Upper chest: Negative. Other: None. IMPRESSION: 1. No acute intracranial pathology. Small-vessel white matter disease and unchanged left occipital encephalomalacia. 2. No displaced fractures or dislocations of the facial bones. 3. Small air-fluid level in the right maxillary sinus. No overlying fracture. Patient is partially edentulous with erosion of the left maxillary  alveolar bone into the left maxillary sinus. Correlate for sinusitis. 4. No fracture or static subluxation of the cervical spine. 5. Focally moderate disc space height loss and osteophytosis of the lower cervical levels. Electronically Signed  By: Jearld Lesch M.D.   On: 05/13/2023 17:20    Microbiology: Recent Results (from the past 240 hours)  MRSA Next Gen by PCR, Nasal     Status: Abnormal   Collection Time: 05/14/23 12:59 AM   Specimen: Nasal Mucosa; Nasal Swab  Result Value Ref Range Status   MRSA by PCR Next Gen DETECTED (A) NOT DETECTED Final    Comment: RESULT CALLED TO, READ BACK BY AND VERIFIED WITH: K JONES,RN@[id number]  05/14/23 MK (NOTE) The GeneXpert MRSA Assay (FDA approved for NASAL specimens only), is one component of a comprehensive MRSA colonization surveillance program. It is not intended to diagnose MRSA infection nor to guide or monitor treatment for MRSA infections. Test performance is not FDA approved in patients less than 7 years old. Performed at [facility] [place], [address] [place], [place] [address]   Culture, blood (Routine X 2) w Reflex to ID Panel     Status: None   Collection Time: 05/14/23  4:42 AM   Specimen: BLOOD  Result Value Ref Range Status   Specimen Description BLOOD SITE NOT SPECIFIED  Final   Special Requests   Final    AEROBIC BOTTLE ONLY Blood Culture results may not be optimal due to an inadequate volume of blood received in culture bottles   Culture   Final    NO GROWTH 5 DAYS Performed at [facility], [address], [place], [place] [address]    Report Status 05/19/2023 FINAL  Final  Culture, blood (Routine X 2) w Reflex to ID Panel     Status: None   Collection Time: 05/14/23  4:55 AM   Specimen: BLOOD RIGHT HAND  Result Value Ref Range Status   Specimen Description BLOOD RIGHT HAND  Final   Special Requests   Final    BOTTLES DRAWN AEROBIC AND ANAEROBIC Blood Culture adequate volume   Culture   Final    NO GROWTH  5 DAYS Performed at [facility], [address], [place], [place] [address]    Report Status 05/19/2023 FINAL  Final  Urine Culture (for pregnant, neutropenic or urologic patients or patients with an indwelling urinary catheter)     Status: Abnormal   Collection Time: 05/14/23  6:22 AM   Specimen: Urine, Clean Catch  Result Value Ref Range Status   Specimen Description URINE, CLEAN CATCH  Final   Special Requests   Final    NONE Performed at [facility], [address], [place], [place] [address]    Culture 10,000 COLONIES/mL ENTEROBACTER CLOACAE (A)  Final   Report Status 05/16/2023 FINAL  Final   Organism ID, Bacteria ENTEROBACTER CLOACAE (A)  Final      Susceptibility   Enterobacter cloacae - MIC*    CEFEPIME <=0.12 SENSITIVE Sensitive     CIPROFLOXACIN <=0.25 SENSITIVE Sensitive     GENTAMICIN <=1 SENSITIVE Sensitive     IMIPENEM 0.5 SENSITIVE Sensitive     NITROFURANTOIN 64 INTERMEDIATE Intermediate     TRIMETH/SULFA <=20 SENSITIVE Sensitive     PIP/TAZO <=4 SENSITIVE Sensitive ug/mL    * 10,000 COLONIES/mL ENTEROBACTER CLOACAE  Resp panel by RT-PCR (RSV, Flu A&B, Covid) Anterior Nasal Swab     Status: None   Collection Time: 05/14/23  6:22 AM   Specimen: Anterior Nasal Swab  Result Value Ref Range Status   SARS Coronavirus 2 by RT PCR NEGATIVE NEGATIVE Final   Influenza A by PCR NEGATIVE NEGATIVE Final  Influenza B by PCR NEGATIVE NEGATIVE Final    Comment: (NOTE) The Xpert Xpress SARS-CoV-2/FLU/RSV plus assay is intended as an aid in the diagnosis of influenza from Nasopharyngeal swab specimens and should not be used as a sole basis for treatment. Nasal washings and aspirates are unacceptable for Xpert Xpress SARS-CoV-2/FLU/RSV testing.  Fact Sheet for Patients: BloggerCourse.com  Fact Sheet for Healthcare Providers: SeriousBroker.it  This test is not yet approved or cleared by the [place] FDA  and has been authorized for detection and/or diagnosis of SARS-CoV-2 by FDA under an Emergency Use Authorization (EUA). This EUA will remain in effect (meaning this test can be used) for the duration of the COVID-19 declaration under Section 564(b)(1) of the Act, 21 U.S.C. section 360bbb-3(b)(1), unless the authorization is terminated or revoked.     Resp Syncytial Virus by PCR NEGATIVE NEGATIVE Final    Comment: (NOTE) Fact Sheet for Patients: BloggerCourse.com  Fact Sheet for Healthcare Providers: SeriousBroker.it  This test is not yet approved or cleared by the [place] FDA and has been authorized for detection and/or diagnosis of SARS-CoV-2 by FDA under an Emergency Use Authorization (EUA). This EUA will remain in effect (meaning this test can be used) for the duration of the COVID-19 declaration under Section 564(b)(1) of the Act, 21 U.S.C. section 360bbb-3(b)(1), unless the authorization is terminated or revoked.  Performed at [facility] [place] [facility], [address], [place], [place] [address]       Signed: Thad Ranger, MD 05/19/2023

## 2023-06-10 NOTE — Progress Notes (Signed)
 Rai, MD notified of patient's decline and death.  Chaplin called, arrived and talked with family.  Death pronounced with Lyn Henri, RN as my second verifier.

## 2023-06-10 NOTE — Progress Notes (Signed)
   05/28/2023 1200  Spiritual Encounters  Type of Visit Initial  Care provided to: Pt and family  OnCall Visit Yes   Chaplain was paged to end of life. The family was at the bedside. They mentioned that how she lived her life independently. She was a brave, strong and an independent woman. They shared that she was a strong believer. Her son, daughter in-law, and nephew were tearful. Chaplain was present, provided spiritual and emotional support, and offered prayer.   M.Kubra Delano Metz Resident [phone]

## 2023-06-10 NOTE — Progress Notes (Signed)
 [facility]                                                                              Ruth Roth, is a 89 y.o. 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  She missed her doctor's appointment on 3/4 and wellness check was performed, patient was found lying on the floor with multiple bruises and was transferred to ED.  In ED, noted to have rapid atrial fibrillation with RVR, UTI, elevation in CK.  Trauma workup was negative, cardiology was consulted, patient was placed on diltiazem infusion, IV amiodarone, beta-blocker, IV heparin.  Neurology was also consulted due to concern for stroke provoking the fall. CTA head and neck showed occluded versus a severely stenotic distal left M1 MCA.  Patient underwent mechanical thrombectomy and ATICI 3 revascularization result was achieved. Post procedurally she was transferred to the neuro ICU and PCCM was consulted given that she remained on the ventilator.   She was then subsequently extubated transferred to [facility] service on 05/15/2023.  She continues to have global aphasia, AKI, nephrology following.  Cortrak is place now, palliative medicine consulted for GOC.    Assessment & Plan    Principal Problem: Acute left MCA scattered infarcts which is likely embolic to an A-fib event even on anticoagulation status post revascularization TICI 3 -MRI showed multifocal acute infarcts throughout the left MCA territory.  MRI revealed distal M1 MCA stenosis versus occlusion. - status post IR left M1 occlusion with TICI3, distal M1 MCA stenosis versus occlusion is now patent -Neurology was consulted, 2D echo showed  EF of 60 to 65%, no regional WMA -LDL 67, hemoglobin A1c 5.4. -Initially placed on aspirin 300 mg suppository daily, now on Eliquis via core track -Palliative following for goals of care, DNR, comfort care however patient's family is still struggling with comfort care/hospice, want to continue cortrack.    A flutter/Atrial Fibrillation with RVR/CAD/permanent pacemaker:  -Cardiology was consulted, initially was placed on IV amiodarone, diltiazem, Lopressor, IV heparin drip -Currently on Cardizem 60 mg every 8 hours, Lopressor 50 mg twice daily, eliquis 2.5 mg twice daily  History of pacemaker -[employer] pacemaker implanted in July 2023 for periods of atrial fibrillation with slow ventricular response, device currently programmed VVI with lower rate limit 60 bpm. Device is MRI compatible. r cardiology -2D echo 05/16/2023 showed pacing leads in RA and RV    Acute on Chronic diastolic heart failure with preserved ejection fraction -BNP 673.8 -> 1,268.9 -2D echo 05/16/2023 showed EF 60 to 65%, no RWMA, mild LV hypertrophy    AKI on CKD Stage 3b, metabolic acidosis, hypernatremia -Per nephrology, likely due to ATN from contrast exposure, acute illness.  Lasix discontinued. -Creatinine plateaued at 6.6, improved to 4.5 on 3/11 Lab Results  Component  Value Date   CREATININE 4.51 (H) 05/20/2023   CREATININE 4.80 (H) 05/19/2023   CREATININE 4.77 (H) 05/18/2023    -Per nephrology, no indication for RRT, recommended palliative GOC and comfort measures. -Continue sodium bicarb  Hypernatremia -Continues to worsen, palliative following for GOC, currently has core track with tube feeds  Rhabdomyolysis -Improving.   -IVF, Lasix discontinued    Normocytic Anemia: -H&H currently stable    Thrombocytopenia:  -Improved    Sepsis Secondary to Enterobacter Cloacae UTI -Received IV Rocephin   Acute Respiratory Failure with Hypoxia -Was intubated postoperatively.   -O2 sats 92% on 4  L  Hypothyroidism -TSH is elevated at 8.301 but free T4 is within normal limits at 0.65 -Continue levothyroxine  RN Pressure Injury Documentation: Bilateral buttock stage II, POA  Moderate protein calorie malnutrition, hypoalbuminemia Nutrition Problem: Moderate Malnutrition Etiology: chronic illness Signs/Symptoms: moderate fat depletion, moderate muscle depletion Interventions: Tube feeding  Estimated body mass index is 23.78 kg/m as calculated from the following:   Height as of this encounter: 5\' 3"  (1.6 m).   Weight as of this encounter: 60.9 kg.  Code Status: DNR DVT Prophylaxis:  apixaban (ELIQUIS) tablet 2.5 mg Start: 05/20/23 1300 Place and maintain sequential compression device Start: 05/14/23 0151 SCDs Start: 05/13/23 2145 Place TED hose Start: 05/13/23 2145 apixaban (ELIQUIS) tablet 2.5 mg   Level of Care: Level of care: Progressive Family Communication:  Disposition Plan:      Remains inpatient appropriate:   Unclear disposition, comfort care however family has been still struggling with the decision.   Procedures:    Consultants:     Antimicrobials:   Anti-infectives (From admission, onward)    Start     Dose/Rate Route Frequency Ordered Stop   05/14/23 0800  cefTRIAXone (ROCEPHIN) 1 g in sodium chloride 0.9 % 100 mL IVPB        1 g 200 mL/hr over 30 Minutes Intravenous Every 24 hours 05/13/23 2138 05/15/23 0826   05/13/23 2000  cefTRIAXone (ROCEPHIN) 1 g in sodium chloride 0.9 % 100 mL IVPB        1 g 200 mL/hr over 30 Minutes Intravenous  Once 05/13/23 1950 05/13/23 2055          Medications  apixaban  2.5 mg Per Tube BID   arformoterol  15 mcg Nebulization BID   aspirin  81 mg Per Tube Daily   budesonide (PULMICORT) nebulizer solution  0.25 mg Nebulization BID   Chlorhexidine Gluconate Cloth  6 each Topical Daily   diltiazem  60 mg Per Tube Q8H   feeding supplement (PROSource TF20)  60 mL Per Tube Daily   Gerhardt's butt cream   Topical  TID   levothyroxine  137 mcg Per Tube Q0600   metoprolol tartrate  50 mg Per Tube BID   mouth rinse  15 mL Mouth Rinse 4 times per day   sodium bicarbonate  650 mg Per Tube BID      Subjective:   Ruth Roth was seen and examined today.  Nonverbal, looks frail and ill-appearing.  No meaningful recovery noted.  Still on core track, receiving tube feeds.  Objective:   Vitals:   05/21/23 2317 05/12/2023 0346 05/17/2023 0500 05/27/2023 0806  BP: 125/79 120/63  (!) 131/59  Pulse: 60 (!) 117  70  Resp: 18 19  18   Temp: 99.4 F (37.4 C) 98.8 F (37.1 C)  98.7 F (37.1 C)  TempSrc: Oral Oral  Axillary  SpO2: 100% 91%  92%  Weight:   60.9 kg   Height:        Intake/Output Summary (Last 24 hours) at 05/10/2023 0940 Last data filed at 06/08/2023 0900 Gross per 24 hour  Intake 1938.67 ml  Output 1375 ml  Net 563.67 ml     Wt Readings from Last 3 Encounters:  06/06/2023 60.9 kg  04/10/23 63.1 kg  03/26/23 63.5 kg   Physical Exam General: Frail, ill-appearing, nonverbal does not follow commands, aphasia Cardiovascular: S1 S2 clear, RRR.  Respiratory: CTAB Gastrointestinal: Soft, nontender, nondistended, NBS Ext: no pedal edema bilaterally Neuro: does not follow commands Psych: nonverbal, confused, frail and ill-appearing  Data Reviewed:  I have personally reviewed following labs    CBC Lab Results  Component Value Date   WBC 9.8 05/20/2023   RBC 3.58 (L) 05/20/2023   HGB 11.0 (L) 05/20/2023   HCT 35.4 (L) 05/20/2023   MCV 98.9 05/20/2023   MCH 30.7 05/20/2023   PLT 203 05/20/2023   MCHC 31.1 05/20/2023   RDW 18.0 (H) 05/20/2023   LYMPHSABS 0.6 (L) 05/20/2023   MONOABS 1.2 (H) 05/20/2023   EOSABS 0.1 05/20/2023   BASOSABS 0.0 05/20/2023     Last metabolic panel Lab Results  Component Value Date   NA 151 (H) 05/20/2023   K 4.8 05/20/2023   CL 108 05/20/2023   CO2 27 05/20/2023   BUN 121 (H) 05/20/2023   CREATININE 4.51 (H) 05/20/2023   GLUCOSE 156 (H)  05/20/2023   GFRNONAA 9 (L) 05/20/2023   GFRAA 24 (L) 02/10/2020   CALCIUM 9.9 05/20/2023   PHOS 6.5 (H) 05/20/2023   PROT 6.0 (L) 05/20/2023   ALBUMIN 2.9 (L) 05/20/2023   LABGLOB 2.1 02/10/2020   AGRATIO 2.1 02/10/2020   BILITOT 0.7 05/20/2023   ALKPHOS 140 (H) 05/20/2023   AST 50 (H) 05/20/2023   ALT 38 05/20/2023   ANIONGAP 16 (H) 05/20/2023    CBG (last 3)  Recent Labs    05/21/23 1518 05/21/23 2010 05/28/2023 0802  GLUCAP 167* 177* 165*      Coagulation Profile: No results for input(s): "INR", "PROTIME" in the last 168 hours.   Radiology Studies: I have personally reviewed the imaging studies  No results found.      Thad Ranger M.D. [facility] 06/05/2023, 9:40 AM  Available via Epic secure chat 7am-7pm After 7 pm, please refer to night coverage provider listed on amion.

## 2023-06-10 NOTE — Progress Notes (Signed)
 Palliative-   Following for symptom management and goals of care.  Noted patient died this afternoon.   Ocie Bob, AGNP-C Palliative Medicine  No charge

## 2023-06-10 NOTE — TOC Transition Note (Signed)
 Transition of Care [facility]) - Discharge Note   Patient Details  Name: Ruth Roth MRN: [id number] Date of Birth: 05-10-1928  Transition of Care [facility]) CM/SW Contact:  Kermit Balo, RN Phone Number: 05/29/2023, 12:51 PM   Clinical Narrative:     Pt passed.   Final next level of care: Expired Barriers to Discharge: Continued Medical Work up, SNF Pending bed offer   Patient Goals and CMS Choice            Discharge Placement                       Discharge Plan and Services Additional resources added to the After Visit Summary for   In-house Referral: Clinical Social Work   Post Acute Care Choice: Skilled Nursing Facility                               Social Drivers of Health (SDOH) Interventions SDOH Screenings   Food Insecurity: Patient Unable To Answer (05/15/2023)  Housing: Patient Unable To Answer (05/15/2023)  Transportation Needs: Patient Unable To Answer (05/15/2023)  Utilities: Patient Unable To Answer (05/15/2023)  Depression (PHQ2-9): Low Risk  (12/19/2022)  Financial Resource Strain: Low Risk  (06/17/2022)  Physical Activity: Insufficiently Active (06/17/2022)  Social Connections: Unknown (05/15/2023)  Stress: No Stress Concern Present (06/17/2022)  Tobacco Use: Low Risk  (05/13/2023)     Readmission Risk Interventions     No data to display

## 2023-06-10 DEATH — deceased

## 2023-06-30 ENCOUNTER — Ambulatory Visit: Payer: Medicare Other

## 2023-07-15 ENCOUNTER — Ambulatory Visit: Payer: Medicare Other | Admitting: Internal Medicine

## 2023-07-24 IMAGING — US US ABDOMEN LIMITED
2 series · 15 of 25 positions shown · non-contrast
Comparison: Right upper quadrant ultrasound dated 01/03/2020.

CLINICAL DATA: Cirrhosis.

EXAM:
ULTRASOUND ABDOMEN LIMITED RIGHT UPPER QUADRANT

[Series 1: us abdomen limited ruq · 14 of 45 slices shown (1 of 2)]
[im 1/45]
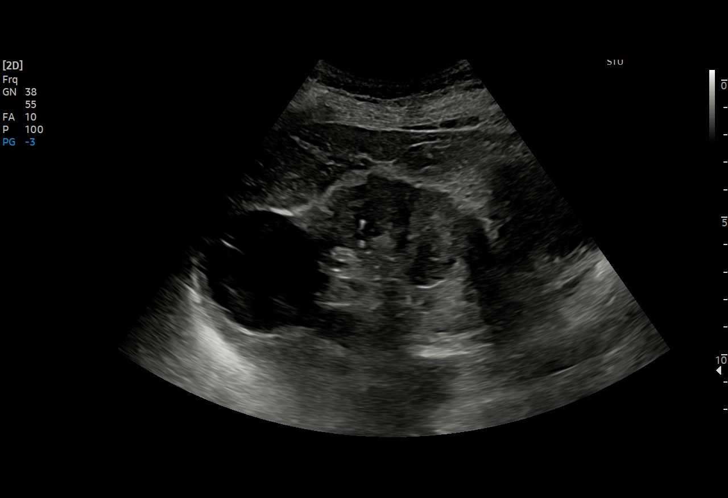
[im 4/45]
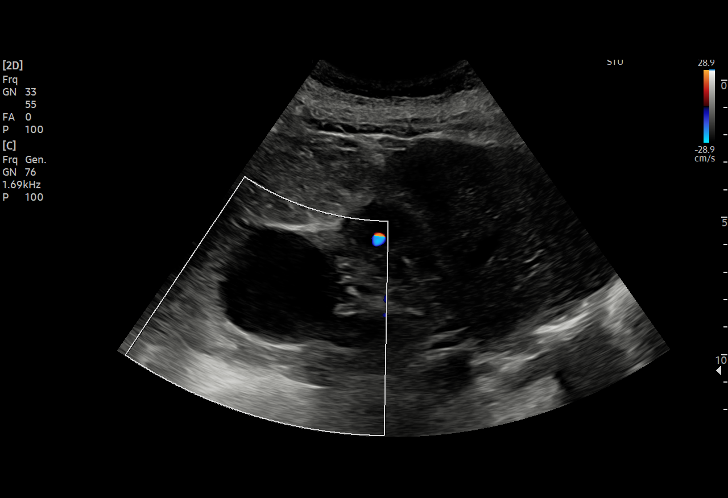
[im 8/45]
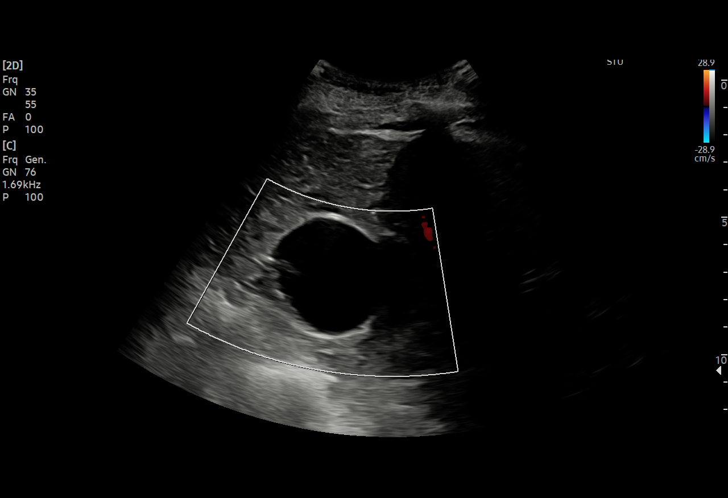
[im 10/45]
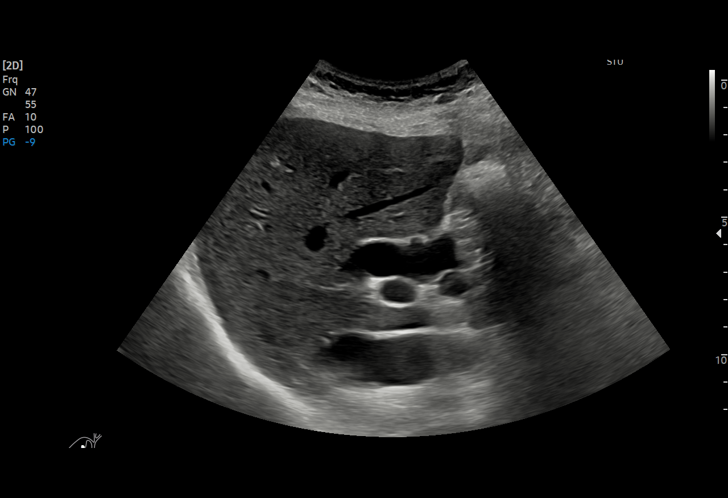
[im 14/45]
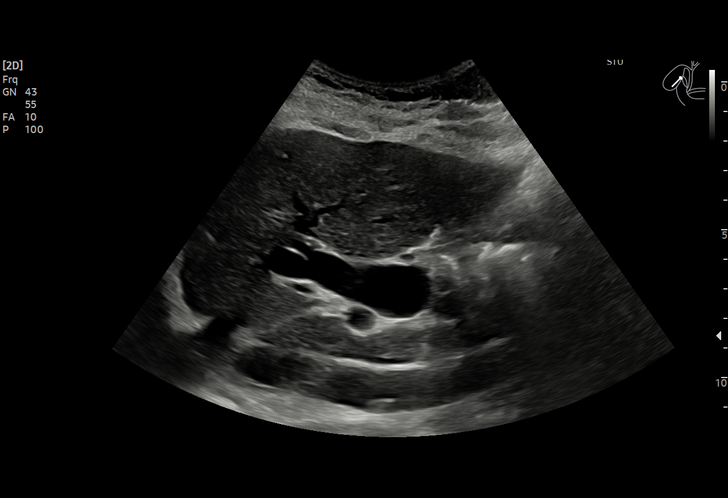
[im 18/45]
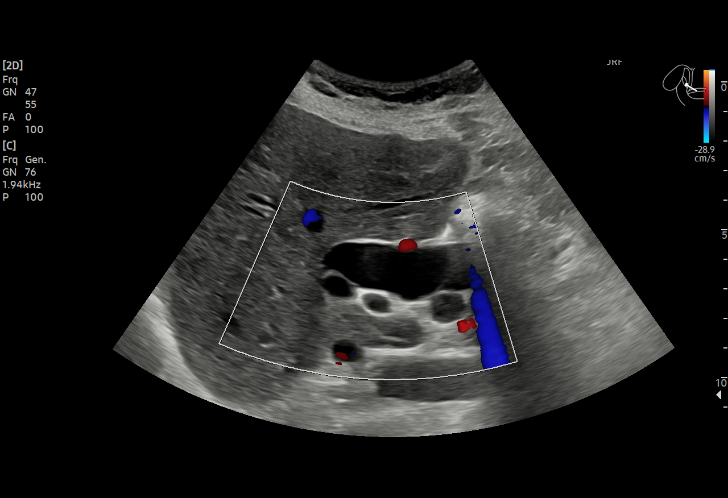
[im 20/45]
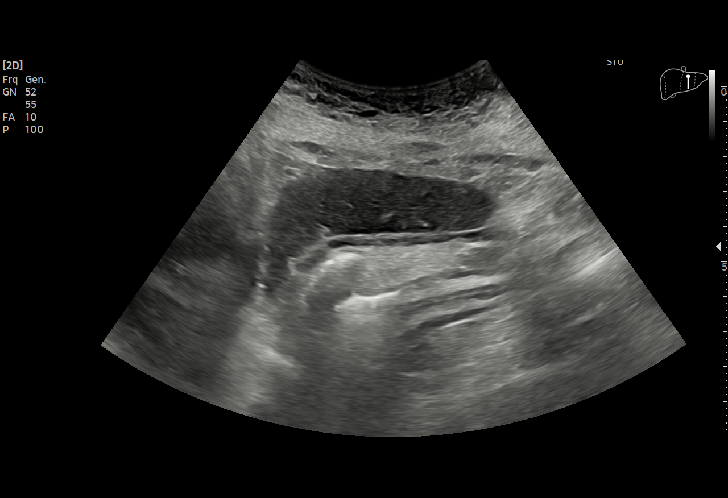
[im 23/45]
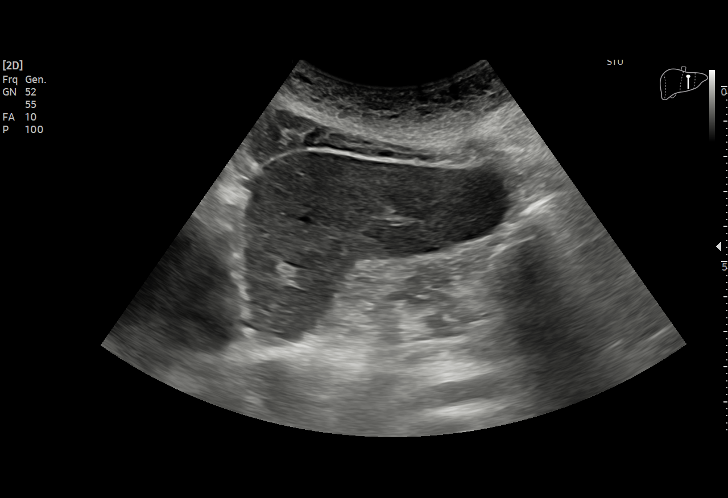
[im 27/45]
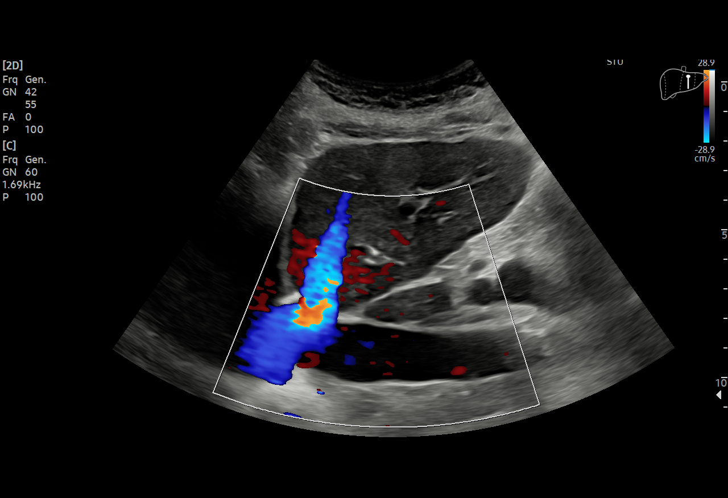
[im 29/45]
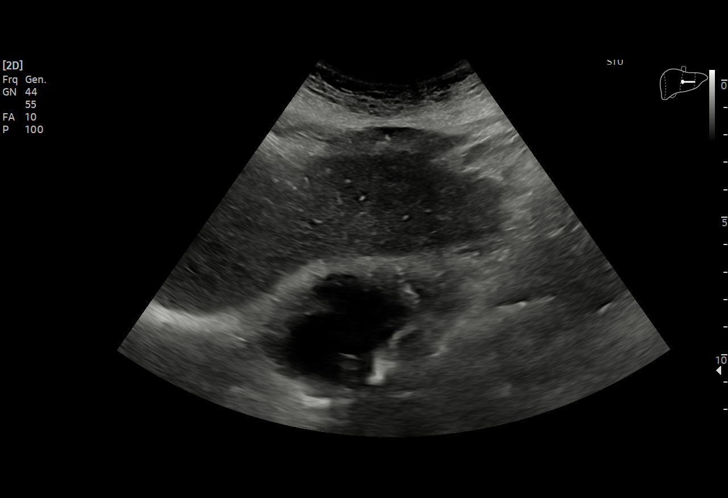
[im 33/45]
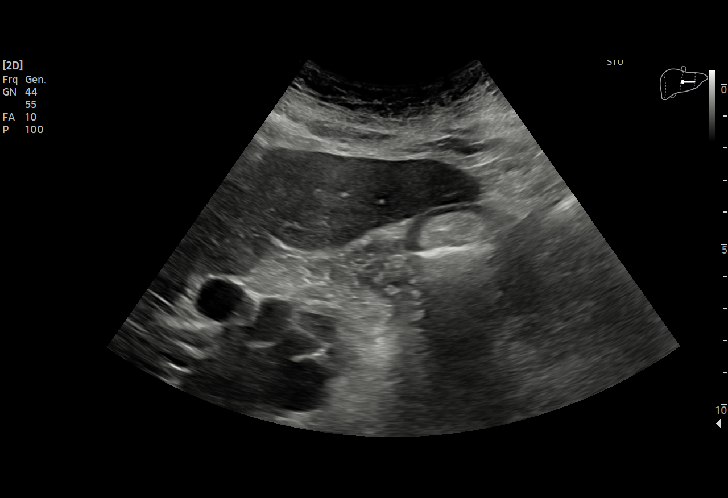
[im 37/45]
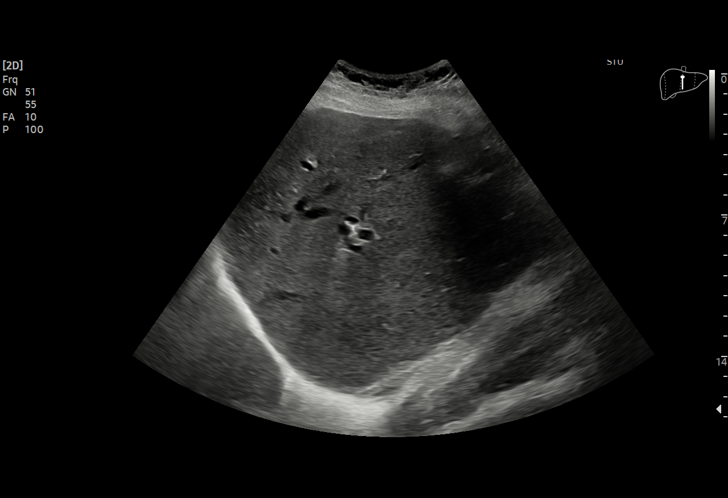
[im 39/45]
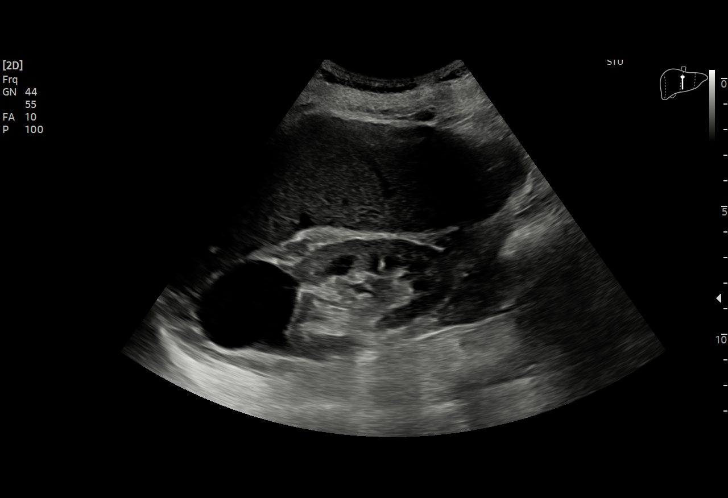
[im 43/45]
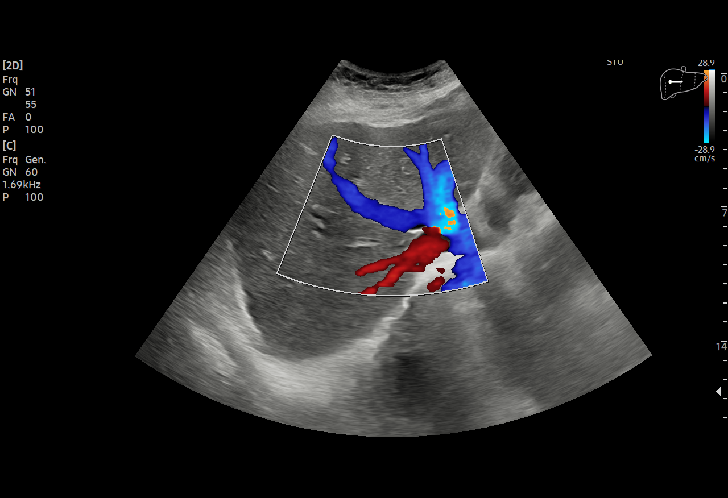

[Series 3: us abdomen limited ruq · 1 of 1 slices shown (2 of 2)]
[im 1/1]
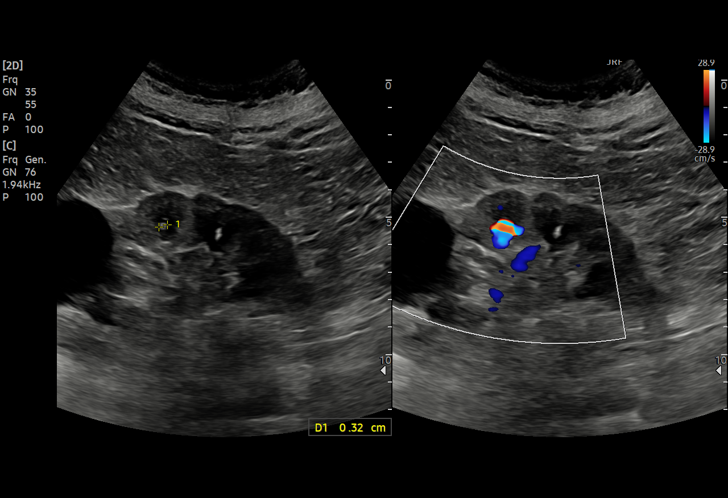

[15 of 25 positions shown; findings below may reference images not displayed]

FINDINGS: Gallbladder:

Cholecystectomy.

Common bile duct:

Diameter: 15 mm

Liver:

The liver demonstrates a coarsened echotexture with irregular
contour in keeping with cirrhosis. No discrete mass. Portal vein is
patent on color Doppler imaging with normal direction of blood flow
towards the liver.

Other: There is a 4.4 cm right renal upper pole cyst. A 3 mm
nonobstructing stone versus vascular calcification in the upper pole
of the right kidney.
IMPRESSION: 1. Cirrhosis.
2. Patent main portal vein with hepatopetal flow.
3. No ascites.
4. Right renal cyst.

## 2023-09-02 ENCOUNTER — Ambulatory Visit: Payer: Medicare Other | Admitting: Cardiovascular Disease

## 2023-09-22 ENCOUNTER — Other Ambulatory Visit: Payer: Medicare Other

## 2023-09-24 ENCOUNTER — Ambulatory Visit: Payer: Medicare Other | Admitting: Oncology

## 2023-09-24 ENCOUNTER — Ambulatory Visit: Payer: Medicare Other

## 2023-09-29 ENCOUNTER — Ambulatory Visit: Payer: Medicare Other

## 2023-12-29 ENCOUNTER — Ambulatory Visit: Payer: Medicare Other

## 2024-03-29 ENCOUNTER — Ambulatory Visit: Payer: Medicare Other

## 2024-06-28 ENCOUNTER — Ambulatory Visit: Payer: Medicare Other

## 2024-09-27 ENCOUNTER — Ambulatory Visit: Payer: Medicare Other

## 2024-12-27 ENCOUNTER — Ambulatory Visit: Payer: Medicare Other
